# Patient Record
Sex: Female | Born: 1945
Health system: Southern US, Community
[De-identification: ages and names within clinical notes are randomized; demographics above are authoritative.]

## PROBLEM LIST (undated history)

## (undated) DIAGNOSIS — E785 Hyperlipidemia, unspecified: Secondary | ICD-10-CM

## (undated) DIAGNOSIS — F329 Major depressive disorder, single episode, unspecified: Secondary | ICD-10-CM

## (undated) DIAGNOSIS — I639 Cerebral infarction, unspecified: Secondary | ICD-10-CM

## (undated) DIAGNOSIS — I2510 Atherosclerotic heart disease of native coronary artery without angina pectoris: Secondary | ICD-10-CM

## (undated) DIAGNOSIS — I350 Nonrheumatic aortic (valve) stenosis: Secondary | ICD-10-CM

## (undated) DIAGNOSIS — Z8616 Personal history of COVID-19: Secondary | ICD-10-CM

## (undated) DIAGNOSIS — Z531 Procedure and treatment not carried out because of patient's decision for reasons of belief and group pressure: Secondary | ICD-10-CM

## (undated) DIAGNOSIS — I10 Essential (primary) hypertension: Secondary | ICD-10-CM

## (undated) DIAGNOSIS — I739 Peripheral vascular disease, unspecified: Secondary | ICD-10-CM

## (undated) DIAGNOSIS — N182 Chronic kidney disease, stage 2 (mild): Secondary | ICD-10-CM

## (undated) DIAGNOSIS — K5790 Diverticulosis of intestine, part unspecified, without perforation or abscess without bleeding: Secondary | ICD-10-CM

## (undated) DIAGNOSIS — M1990 Unspecified osteoarthritis, unspecified site: Secondary | ICD-10-CM

## (undated) DIAGNOSIS — F32A Depression, unspecified: Secondary | ICD-10-CM

## (undated) DIAGNOSIS — C50919 Malignant neoplasm of unspecified site of unspecified female breast: Secondary | ICD-10-CM

## (undated) DIAGNOSIS — IMO0001 Reserved for inherently not codable concepts without codable children: Secondary | ICD-10-CM

## (undated) DIAGNOSIS — Z950 Presence of cardiac pacemaker: Secondary | ICD-10-CM

## (undated) HISTORY — DX: Essential (primary) hypertension: I10

## (undated) HISTORY — DX: Malignant neoplasm of unspecified site of unspecified female breast: C50.919

## (undated) HISTORY — DX: Reserved for inherently not codable concepts without codable children: IMO0001

## (undated) HISTORY — DX: Atherosclerotic heart disease of native coronary artery without angina pectoris: I25.10

## (undated) HISTORY — DX: Procedure and treatment not carried out because of patient's decision for reasons of belief and group pressure: Z53.1

## (undated) HISTORY — DX: Depression, unspecified: F32.A

## (undated) HISTORY — PX: CHOLECYSTECTOMY: SHX55

## (undated) HISTORY — DX: Cerebral infarction, unspecified: I63.9

## (undated) HISTORY — DX: Peripheral vascular disease, unspecified: I73.9

## (undated) HISTORY — DX: Nonrheumatic aortic (valve) stenosis: I35.0

## (undated) HISTORY — DX: Diverticulosis of intestine, part unspecified, without perforation or abscess without bleeding: K57.90

## (undated) HISTORY — DX: Major depressive disorder, single episode, unspecified: F32.9

## (undated) HISTORY — DX: Hyperlipidemia, unspecified: E78.5

## (undated) SURGERY — Surgical Case
Anesthesia: *Unknown

---
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
## 1985-02-24 HISTORY — PX: ABDOMINAL HYSTERECTOMY: SHX81

## 1990-02-24 DIAGNOSIS — I130 Hypertensive heart and chronic kidney disease with heart failure and stage 1 through stage 4 chronic kidney disease, or unspecified chronic kidney disease: Secondary | ICD-10-CM | POA: Diagnosis present

## 1990-02-24 DIAGNOSIS — E876 Hypokalemia: Secondary | ICD-10-CM | POA: Diagnosis present

## 1990-02-24 DIAGNOSIS — R001 Bradycardia, unspecified: Secondary | ICD-10-CM | POA: Diagnosis present

## 1990-03-25 DIAGNOSIS — E119 Type 2 diabetes mellitus without complications: Secondary | ICD-10-CM | POA: Insufficient documentation

## 1990-03-25 DIAGNOSIS — E1129 Type 2 diabetes mellitus with other diabetic kidney complication: Secondary | ICD-10-CM

## 1993-02-24 HISTORY — PX: MASTECTOMY: SHX3

## 1994-02-24 DIAGNOSIS — C50919 Malignant neoplasm of unspecified site of unspecified female breast: Secondary | ICD-10-CM

## 1994-02-24 HISTORY — DX: Malignant neoplasm of unspecified site of unspecified female breast: C50.919

## 1997-12-13 ENCOUNTER — Encounter: Admission: RE | Admit: 1997-12-13 | Discharge: 1997-12-13 | Payer: Self-pay | Admitting: Family Medicine

## 1998-01-16 ENCOUNTER — Encounter: Admission: RE | Admit: 1998-01-16 | Discharge: 1998-01-16 | Payer: Self-pay | Admitting: Sports Medicine

## 1998-04-25 ENCOUNTER — Encounter (INDEPENDENT_AMBULATORY_CARE_PROVIDER_SITE_OTHER): Payer: Self-pay | Admitting: *Deleted

## 1998-04-25 LAB — CONVERTED CEMR LAB

## 1998-04-27 ENCOUNTER — Encounter: Admission: RE | Admit: 1998-04-27 | Discharge: 1998-04-27 | Payer: Self-pay | Admitting: Family Medicine

## 1998-05-07 ENCOUNTER — Encounter: Admission: RE | Admit: 1998-05-07 | Discharge: 1998-05-07 | Payer: Self-pay | Admitting: Sports Medicine

## 1998-10-15 ENCOUNTER — Emergency Department (HOSPITAL_COMMUNITY): Admission: EM | Admit: 1998-10-15 | Discharge: 1998-10-15 | Payer: Self-pay | Admitting: Emergency Medicine

## 1998-10-16 ENCOUNTER — Encounter: Admission: RE | Admit: 1998-10-16 | Discharge: 1998-10-16 | Payer: Self-pay | Admitting: Family Medicine

## 1998-10-26 ENCOUNTER — Encounter: Admission: RE | Admit: 1998-10-26 | Discharge: 1998-10-26 | Payer: Self-pay | Admitting: Family Medicine

## 1999-02-25 DIAGNOSIS — K5790 Diverticulosis of intestine, part unspecified, without perforation or abscess without bleeding: Secondary | ICD-10-CM

## 1999-02-25 HISTORY — DX: Diverticulosis of intestine, part unspecified, without perforation or abscess without bleeding: K57.90

## 1999-03-18 ENCOUNTER — Encounter: Admission: RE | Admit: 1999-03-18 | Discharge: 1999-03-18 | Payer: Self-pay | Admitting: Sports Medicine

## 1999-03-18 ENCOUNTER — Encounter: Admission: RE | Admit: 1999-03-18 | Discharge: 1999-03-18 | Payer: Self-pay | Admitting: Family Medicine

## 1999-03-18 ENCOUNTER — Encounter: Payer: Self-pay | Admitting: Sports Medicine

## 1999-04-08 ENCOUNTER — Encounter: Admission: RE | Admit: 1999-04-08 | Discharge: 1999-04-08 | Payer: Self-pay | Admitting: Family Medicine

## 1999-04-29 ENCOUNTER — Ambulatory Visit (HOSPITAL_COMMUNITY): Admission: RE | Admit: 1999-04-29 | Discharge: 1999-04-29 | Payer: Self-pay

## 1999-05-10 ENCOUNTER — Encounter: Admission: RE | Admit: 1999-05-10 | Discharge: 1999-05-10 | Payer: Self-pay | Admitting: Family Medicine

## 1999-07-05 ENCOUNTER — Ambulatory Visit (HOSPITAL_COMMUNITY): Admission: RE | Admit: 1999-07-05 | Discharge: 1999-07-05 | Payer: Self-pay | Admitting: *Deleted

## 1999-07-05 LAB — HM COLONOSCOPY

## 1999-12-28 ENCOUNTER — Emergency Department (HOSPITAL_COMMUNITY): Admission: EM | Admit: 1999-12-28 | Discharge: 1999-12-29 | Payer: Self-pay | Admitting: Emergency Medicine

## 1999-12-30 ENCOUNTER — Encounter: Admission: RE | Admit: 1999-12-30 | Discharge: 1999-12-30 | Payer: Self-pay | Admitting: Family Medicine

## 2000-01-10 ENCOUNTER — Encounter: Admission: RE | Admit: 2000-01-10 | Discharge: 2000-01-10 | Payer: Self-pay | Admitting: General Surgery

## 2000-01-10 ENCOUNTER — Encounter: Payer: Self-pay | Admitting: General Surgery

## 2000-03-18 ENCOUNTER — Encounter: Admission: RE | Admit: 2000-03-18 | Discharge: 2000-03-18 | Payer: Self-pay | Admitting: Family Medicine

## 2000-06-08 ENCOUNTER — Ambulatory Visit (HOSPITAL_COMMUNITY): Admission: RE | Admit: 2000-06-08 | Discharge: 2000-06-08 | Payer: Self-pay | Admitting: Family Medicine

## 2000-06-08 ENCOUNTER — Encounter: Admission: RE | Admit: 2000-06-08 | Discharge: 2000-06-08 | Payer: Self-pay | Admitting: Family Medicine

## 2000-06-09 ENCOUNTER — Encounter: Admission: RE | Admit: 2000-06-09 | Discharge: 2000-06-09 | Payer: Self-pay | Admitting: Family Medicine

## 2000-07-10 ENCOUNTER — Ambulatory Visit (HOSPITAL_COMMUNITY): Admission: RE | Admit: 2000-07-10 | Discharge: 2000-07-10 | Payer: Self-pay | Admitting: Sports Medicine

## 2000-07-21 ENCOUNTER — Encounter: Admission: RE | Admit: 2000-07-21 | Discharge: 2000-07-21 | Payer: Self-pay | Admitting: Family Medicine

## 2000-10-13 ENCOUNTER — Encounter: Admission: RE | Admit: 2000-10-13 | Discharge: 2000-10-13 | Payer: Self-pay | Admitting: Family Medicine

## 2001-11-23 ENCOUNTER — Encounter: Admission: RE | Admit: 2001-11-23 | Discharge: 2001-11-23 | Payer: Self-pay | Admitting: Sports Medicine

## 2002-03-19 ENCOUNTER — Emergency Department (HOSPITAL_COMMUNITY): Admission: EM | Admit: 2002-03-19 | Discharge: 2002-03-20 | Payer: Self-pay | Admitting: Emergency Medicine

## 2002-07-29 ENCOUNTER — Encounter: Admission: RE | Admit: 2002-07-29 | Discharge: 2002-07-29 | Payer: Self-pay | Admitting: Family Medicine

## 2002-08-05 ENCOUNTER — Encounter: Admission: RE | Admit: 2002-08-05 | Discharge: 2002-08-05 | Payer: Self-pay | Admitting: Family Medicine

## 2003-05-15 ENCOUNTER — Emergency Department (HOSPITAL_COMMUNITY): Admission: EM | Admit: 2003-05-15 | Discharge: 2003-05-15 | Payer: Self-pay | Admitting: Emergency Medicine

## 2003-05-16 ENCOUNTER — Encounter: Admission: RE | Admit: 2003-05-16 | Discharge: 2003-05-16 | Payer: Self-pay | Admitting: Family Medicine

## 2003-05-18 ENCOUNTER — Ambulatory Visit (HOSPITAL_COMMUNITY): Admission: RE | Admit: 2003-05-18 | Discharge: 2003-05-18 | Payer: Self-pay | Admitting: Family Medicine

## 2003-06-01 ENCOUNTER — Encounter: Admission: RE | Admit: 2003-06-01 | Discharge: 2003-06-01 | Payer: Self-pay | Admitting: Family Medicine

## 2003-06-05 ENCOUNTER — Ambulatory Visit (HOSPITAL_COMMUNITY): Admission: RE | Admit: 2003-06-05 | Discharge: 2003-06-05 | Payer: Self-pay | Admitting: Sports Medicine

## 2003-06-09 ENCOUNTER — Encounter: Admission: RE | Admit: 2003-06-09 | Discharge: 2003-06-09 | Payer: Self-pay | Admitting: Sports Medicine

## 2003-06-12 ENCOUNTER — Emergency Department (HOSPITAL_COMMUNITY): Admission: EM | Admit: 2003-06-12 | Discharge: 2003-06-12 | Payer: Self-pay | Admitting: Family Medicine

## 2004-02-25 DIAGNOSIS — I639 Cerebral infarction, unspecified: Secondary | ICD-10-CM

## 2004-02-25 DIAGNOSIS — I2510 Atherosclerotic heart disease of native coronary artery without angina pectoris: Secondary | ICD-10-CM

## 2004-02-25 HISTORY — DX: Atherosclerotic heart disease of native coronary artery without angina pectoris: I25.10

## 2004-02-25 HISTORY — DX: Cerebral infarction, unspecified: I63.9

## 2004-02-25 HISTORY — PX: CORONARY ARTERY BYPASS GRAFT: SHX141

## 2004-02-25 HISTORY — PX: AORTIC VALVE REPLACEMENT: SHX41

## 2004-05-17 ENCOUNTER — Emergency Department (HOSPITAL_COMMUNITY): Admission: EM | Admit: 2004-05-17 | Discharge: 2004-05-18 | Payer: Self-pay | Admitting: *Deleted

## 2004-05-20 ENCOUNTER — Ambulatory Visit: Payer: Self-pay | Admitting: Internal Medicine

## 2004-05-27 ENCOUNTER — Ambulatory Visit: Payer: Self-pay | Admitting: Internal Medicine

## 2004-06-03 ENCOUNTER — Ambulatory Visit: Payer: Self-pay | Admitting: Internal Medicine

## 2004-06-10 ENCOUNTER — Ambulatory Visit: Payer: Self-pay | Admitting: Internal Medicine

## 2004-06-18 ENCOUNTER — Ambulatory Visit: Payer: Self-pay | Admitting: Internal Medicine

## 2004-06-18 ENCOUNTER — Inpatient Hospital Stay (HOSPITAL_COMMUNITY): Admission: AD | Admit: 2004-06-18 | Discharge: 2004-06-26 | Payer: Self-pay | Admitting: Internal Medicine

## 2004-06-18 ENCOUNTER — Ambulatory Visit: Payer: Self-pay | Admitting: Dentistry

## 2004-06-18 ENCOUNTER — Encounter (INDEPENDENT_AMBULATORY_CARE_PROVIDER_SITE_OTHER): Payer: Self-pay | Admitting: Cardiology

## 2004-06-18 ENCOUNTER — Ambulatory Visit: Payer: Self-pay | Admitting: Cardiology

## 2004-06-24 ENCOUNTER — Encounter (INDEPENDENT_AMBULATORY_CARE_PROVIDER_SITE_OTHER): Payer: Self-pay | Admitting: *Deleted

## 2004-07-05 ENCOUNTER — Encounter (INDEPENDENT_AMBULATORY_CARE_PROVIDER_SITE_OTHER): Payer: Self-pay | Admitting: *Deleted

## 2004-07-05 ENCOUNTER — Inpatient Hospital Stay (HOSPITAL_COMMUNITY): Admission: RE | Admit: 2004-07-05 | Discharge: 2004-07-17 | Payer: Self-pay | Admitting: Surgery

## 2004-07-07 ENCOUNTER — Ambulatory Visit: Payer: Self-pay | Admitting: Pulmonary Disease

## 2004-07-08 ENCOUNTER — Encounter: Payer: Self-pay | Admitting: Cardiology

## 2004-07-17 ENCOUNTER — Ambulatory Visit: Payer: Self-pay | Admitting: Physical Medicine & Rehabilitation

## 2004-07-17 ENCOUNTER — Inpatient Hospital Stay (HOSPITAL_COMMUNITY)
Admission: RE | Admit: 2004-07-17 | Discharge: 2004-08-01 | Payer: Self-pay | Admitting: Physical Medicine & Rehabilitation

## 2004-08-08 ENCOUNTER — Ambulatory Visit: Payer: Self-pay | Admitting: Internal Medicine

## 2004-08-13 ENCOUNTER — Ambulatory Visit: Payer: Self-pay | Admitting: Internal Medicine

## 2004-08-20 ENCOUNTER — Encounter: Admission: RE | Admit: 2004-08-20 | Discharge: 2004-08-20 | Payer: Self-pay | Admitting: Surgery

## 2004-09-02 ENCOUNTER — Encounter
Admission: RE | Admit: 2004-09-02 | Discharge: 2004-12-01 | Payer: Self-pay | Admitting: Physical Medicine & Rehabilitation

## 2004-11-28 ENCOUNTER — Ambulatory Visit: Payer: Self-pay | Admitting: Hospitalist

## 2004-12-05 ENCOUNTER — Ambulatory Visit: Payer: Self-pay | Admitting: Internal Medicine

## 2004-12-13 ENCOUNTER — Ambulatory Visit (HOSPITAL_COMMUNITY): Admission: RE | Admit: 2004-12-13 | Discharge: 2004-12-13 | Payer: Self-pay | Admitting: Sports Medicine

## 2004-12-13 LAB — HM DEXA SCAN

## 2004-12-26 ENCOUNTER — Ambulatory Visit: Payer: Self-pay | Admitting: Internal Medicine

## 2004-12-30 ENCOUNTER — Encounter: Admission: RE | Admit: 2004-12-30 | Discharge: 2004-12-30 | Payer: Self-pay | Admitting: Internal Medicine

## 2005-01-01 ENCOUNTER — Ambulatory Visit: Payer: Self-pay | Admitting: Internal Medicine

## 2005-03-28 ENCOUNTER — Ambulatory Visit: Payer: Self-pay | Admitting: Internal Medicine

## 2005-04-01 ENCOUNTER — Encounter: Admission: RE | Admit: 2005-04-01 | Discharge: 2005-04-01 | Payer: Self-pay | Admitting: Internal Medicine

## 2005-06-06 ENCOUNTER — Ambulatory Visit: Payer: Self-pay | Admitting: Internal Medicine

## 2005-06-06 ENCOUNTER — Ambulatory Visit (HOSPITAL_COMMUNITY): Admission: RE | Admit: 2005-06-06 | Discharge: 2005-06-06 | Payer: Self-pay | Admitting: Internal Medicine

## 2005-07-03 ENCOUNTER — Ambulatory Visit: Payer: Self-pay | Admitting: Internal Medicine

## 2005-07-14 ENCOUNTER — Ambulatory Visit: Payer: Self-pay | Admitting: Internal Medicine

## 2006-01-23 DIAGNOSIS — F32A Depression, unspecified: Secondary | ICD-10-CM | POA: Insufficient documentation

## 2006-01-23 DIAGNOSIS — F329 Major depressive disorder, single episode, unspecified: Secondary | ICD-10-CM

## 2006-01-26 ENCOUNTER — Ambulatory Visit: Payer: Self-pay | Admitting: Internal Medicine

## 2006-02-09 ENCOUNTER — Ambulatory Visit (HOSPITAL_COMMUNITY): Admission: RE | Admit: 2006-02-09 | Discharge: 2006-02-09 | Payer: Self-pay | Admitting: Internal Medicine

## 2006-02-09 ENCOUNTER — Ambulatory Visit: Payer: Self-pay | Admitting: Internal Medicine

## 2006-02-12 ENCOUNTER — Ambulatory Visit: Payer: Self-pay | Admitting: *Deleted

## 2006-02-19 ENCOUNTER — Encounter: Admission: RE | Admit: 2006-02-19 | Discharge: 2006-05-20 | Payer: Self-pay | Admitting: Ophthalmology

## 2006-03-19 ENCOUNTER — Ambulatory Visit: Payer: Self-pay | Admitting: Internal Medicine

## 2006-03-19 LAB — CONVERTED CEMR LAB: Blood Glucose, Fingerstick: 393

## 2006-04-08 ENCOUNTER — Ambulatory Visit: Payer: Self-pay | Admitting: Hospitalist

## 2006-04-08 LAB — CONVERTED CEMR LAB
Glucose, Bld: 338 mg/dL
Hgb A1c MFr Bld: 11.8 %

## 2006-04-13 ENCOUNTER — Telehealth (INDEPENDENT_AMBULATORY_CARE_PROVIDER_SITE_OTHER): Payer: Self-pay | Admitting: *Deleted

## 2006-04-23 ENCOUNTER — Ambulatory Visit: Payer: Self-pay | Admitting: Internal Medicine

## 2006-04-23 ENCOUNTER — Encounter (INDEPENDENT_AMBULATORY_CARE_PROVIDER_SITE_OTHER): Payer: Self-pay | Admitting: Ophthalmology

## 2006-04-24 ENCOUNTER — Encounter (INDEPENDENT_AMBULATORY_CARE_PROVIDER_SITE_OTHER): Payer: Self-pay | Admitting: *Deleted

## 2006-05-04 ENCOUNTER — Telehealth (INDEPENDENT_AMBULATORY_CARE_PROVIDER_SITE_OTHER): Payer: Self-pay | Admitting: *Deleted

## 2006-05-08 ENCOUNTER — Encounter: Admission: RE | Admit: 2006-05-08 | Discharge: 2006-05-08 | Payer: Self-pay | Admitting: Sports Medicine

## 2006-05-20 ENCOUNTER — Telehealth (INDEPENDENT_AMBULATORY_CARE_PROVIDER_SITE_OTHER): Payer: Self-pay | Admitting: Ophthalmology

## 2006-05-21 ENCOUNTER — Encounter (INDEPENDENT_AMBULATORY_CARE_PROVIDER_SITE_OTHER): Payer: Self-pay | Admitting: Ophthalmology

## 2006-05-21 ENCOUNTER — Ambulatory Visit: Payer: Self-pay | Admitting: *Deleted

## 2006-06-01 ENCOUNTER — Telehealth (INDEPENDENT_AMBULATORY_CARE_PROVIDER_SITE_OTHER): Payer: Self-pay | Admitting: *Deleted

## 2006-06-02 ENCOUNTER — Encounter (INDEPENDENT_AMBULATORY_CARE_PROVIDER_SITE_OTHER): Payer: Self-pay | Admitting: Ophthalmology

## 2006-06-08 ENCOUNTER — Telehealth (INDEPENDENT_AMBULATORY_CARE_PROVIDER_SITE_OTHER): Payer: Self-pay | Admitting: *Deleted

## 2006-06-15 ENCOUNTER — Ambulatory Visit: Payer: Self-pay | Admitting: Hospitalist

## 2006-06-15 LAB — CONVERTED CEMR LAB: Blood Glucose, Fingerstick: 282

## 2006-06-25 ENCOUNTER — Encounter (INDEPENDENT_AMBULATORY_CARE_PROVIDER_SITE_OTHER): Payer: Self-pay | Admitting: Ophthalmology

## 2006-07-01 ENCOUNTER — Ambulatory Visit: Payer: Self-pay | Admitting: Internal Medicine

## 2006-07-01 LAB — CONVERTED CEMR LAB
Blood Glucose, Fingerstick: 151
Hgb A1c MFr Bld: 8.2 %

## 2006-08-06 ENCOUNTER — Ambulatory Visit: Payer: Self-pay | Admitting: Internal Medicine

## 2006-08-06 LAB — CONVERTED CEMR LAB
Blood Glucose, Fingerstick: 196
Blood Glucose, Home Monitor: 5 mg/dL

## 2006-08-14 ENCOUNTER — Telehealth (INDEPENDENT_AMBULATORY_CARE_PROVIDER_SITE_OTHER): Payer: Self-pay | Admitting: *Deleted

## 2006-08-20 ENCOUNTER — Ambulatory Visit: Payer: Self-pay | Admitting: Internal Medicine

## 2006-08-20 ENCOUNTER — Encounter (INDEPENDENT_AMBULATORY_CARE_PROVIDER_SITE_OTHER): Payer: Self-pay | Admitting: Internal Medicine

## 2006-08-20 ENCOUNTER — Telehealth (INDEPENDENT_AMBULATORY_CARE_PROVIDER_SITE_OTHER): Payer: Self-pay | Admitting: *Deleted

## 2006-08-20 LAB — CONVERTED CEMR LAB: Blood Glucose, Fingerstick: 115

## 2006-11-17 ENCOUNTER — Ambulatory Visit: Payer: Self-pay | Admitting: Hospitalist

## 2006-11-17 LAB — CONVERTED CEMR LAB
Bilirubin Urine: NEGATIVE
Blood Glucose, Fingerstick: 184
Glucose, Urine, Semiquant: NEGATIVE
Hgb A1c MFr Bld: 7.3 %
Ketones, urine, test strip: NEGATIVE
Nitrite: NEGATIVE
Protein, U semiquant: >=300
Specific Gravity, Urine: 1.025
Urobilinogen, UA: 0.2
pH: 6

## 2006-12-18 ENCOUNTER — Ambulatory Visit: Payer: Self-pay | Admitting: *Deleted

## 2006-12-25 ENCOUNTER — Encounter (INDEPENDENT_AMBULATORY_CARE_PROVIDER_SITE_OTHER): Payer: Self-pay | Admitting: Internal Medicine

## 2006-12-25 ENCOUNTER — Ambulatory Visit: Payer: Self-pay | Admitting: Hospitalist

## 2006-12-25 LAB — CONVERTED CEMR LAB
ALT: 11 units/L (ref 0–35)
AST: 14 units/L (ref 0–37)
Albumin: 4.4 g/dL (ref 3.5–5.2)
Alkaline Phosphatase: 91 units/L (ref 39–117)
BUN: 14 mg/dL (ref 6–23)
Bacteria, UA: NONE SEEN
Bilirubin Urine: NEGATIVE
CO2: 29 meq/L (ref 19–32)
Calcium: 9.2 mg/dL (ref 8.4–10.5)
Chloride: 105 meq/L (ref 96–112)
Cholesterol: 116 mg/dL (ref 0–200)
Creatinine, Ser: 0.86 mg/dL (ref 0.40–1.20)
Glucose, Bld: 69 mg/dL — ABNORMAL LOW (ref 70–99)
HDL: 38 mg/dL — ABNORMAL LOW (ref >39)
Hemoglobin, Urine: NEGATIVE
Ketones, ur: NEGATIVE mg/dL
LDL Cholesterol: 52 mg/dL (ref 0–99)
Leukocytes, UA: NEGATIVE
Nitrite: NEGATIVE
Potassium: 4 meq/L (ref 3.5–5.3)
Protein, ur: 30 mg/dL — AB
RBC / HPF: NONE SEEN (ref <3)
Sodium: 146 meq/L — ABNORMAL HIGH (ref 135–145)
Specific Gravity, Urine: 1.009 (ref 1.005–1.03)
Total Bilirubin: 0.3 mg/dL (ref 0.3–1.2)
Total CHOL/HDL Ratio: 3.1
Total Protein: 7.5 g/dL (ref 6.0–8.3)
Triglycerides: 131 mg/dL (ref <150)
Urine Glucose: NEGATIVE mg/dL
Urobilinogen, UA: 0.2 (ref 0.0–1.0)
VLDL: 26 mg/dL (ref 0–40)
WBC, UA: NONE SEEN cells/hpf (ref <3)
pH: 7 (ref 5.0–8.0)

## 2006-12-28 ENCOUNTER — Ambulatory Visit: Payer: Self-pay | Admitting: Hospitalist

## 2007-01-08 ENCOUNTER — Encounter (INDEPENDENT_AMBULATORY_CARE_PROVIDER_SITE_OTHER): Payer: Self-pay | Admitting: Internal Medicine

## 2007-01-08 ENCOUNTER — Ambulatory Visit: Payer: Self-pay | Admitting: Internal Medicine

## 2007-01-08 LAB — CONVERTED CEMR LAB
BUN: 12 mg/dL (ref 6–23)
CO2: 25 meq/L (ref 19–32)
Calcium: 9.5 mg/dL (ref 8.4–10.5)
Chloride: 107 meq/L (ref 96–112)
Creatinine, Ser: 0.85 mg/dL (ref 0.40–1.20)
Glucose, Bld: 95 mg/dL (ref 70–99)
Potassium: 4.1 meq/L (ref 3.5–5.3)
Sodium: 143 meq/L (ref 135–145)

## 2007-02-11 ENCOUNTER — Telehealth (INDEPENDENT_AMBULATORY_CARE_PROVIDER_SITE_OTHER): Payer: Self-pay | Admitting: Internal Medicine

## 2007-03-02 ENCOUNTER — Telehealth: Payer: Self-pay | Admitting: *Deleted

## 2007-03-02 ENCOUNTER — Telehealth (INDEPENDENT_AMBULATORY_CARE_PROVIDER_SITE_OTHER): Payer: Self-pay | Admitting: Pharmacy Technician

## 2007-03-11 ENCOUNTER — Encounter (INDEPENDENT_AMBULATORY_CARE_PROVIDER_SITE_OTHER): Payer: Self-pay | Admitting: Internal Medicine

## 2007-03-22 ENCOUNTER — Telehealth (INDEPENDENT_AMBULATORY_CARE_PROVIDER_SITE_OTHER): Payer: Self-pay | Admitting: *Deleted

## 2007-08-19 ENCOUNTER — Telehealth (INDEPENDENT_AMBULATORY_CARE_PROVIDER_SITE_OTHER): Payer: Self-pay | Admitting: Internal Medicine

## 2007-09-16 ENCOUNTER — Telehealth (INDEPENDENT_AMBULATORY_CARE_PROVIDER_SITE_OTHER): Payer: Self-pay | Admitting: Internal Medicine

## 2007-09-20 ENCOUNTER — Telehealth (INDEPENDENT_AMBULATORY_CARE_PROVIDER_SITE_OTHER): Payer: Self-pay | Admitting: Internal Medicine

## 2008-01-31 ENCOUNTER — Telehealth (INDEPENDENT_AMBULATORY_CARE_PROVIDER_SITE_OTHER): Payer: Self-pay | Admitting: Internal Medicine

## 2008-05-02 ENCOUNTER — Ambulatory Visit: Payer: Self-pay | Admitting: Surgery

## 2008-05-02 ENCOUNTER — Ambulatory Visit: Payer: Self-pay | Admitting: Internal Medicine

## 2008-05-02 ENCOUNTER — Encounter: Payer: Self-pay | Admitting: Internal Medicine

## 2008-05-02 ENCOUNTER — Encounter (INDEPENDENT_AMBULATORY_CARE_PROVIDER_SITE_OTHER): Payer: Self-pay | Admitting: Internal Medicine

## 2008-05-02 ENCOUNTER — Ambulatory Visit (HOSPITAL_COMMUNITY): Admission: RE | Admit: 2008-05-02 | Discharge: 2008-05-02 | Payer: Self-pay | Admitting: Internal Medicine

## 2008-05-02 LAB — CONVERTED CEMR LAB
ALT: 26 units/L (ref 0–35)
AST: 24 units/L (ref 0–37)
Albumin: 3.5 g/dL (ref 3.5–5.2)
Alkaline Phosphatase: 105 units/L (ref 39–117)
BUN: 11 mg/dL (ref 6–23)
Blood Glucose, Fingerstick: 215
CO2: 24 meq/L (ref 19–32)
Calcium: 9.1 mg/dL (ref 8.4–10.5)
Chloride: 105 meq/L (ref 96–112)
Cholesterol: 101 mg/dL (ref 0–200)
Creatinine, Ser: 0.78 mg/dL (ref 0.40–1.20)
Creatinine, Urine: 42.5 mg/dL
Glucose, Bld: 184 mg/dL — ABNORMAL HIGH (ref 70–99)
HCT: 41.8 % (ref 36.0–46.0)
HDL: 20 mg/dL — ABNORMAL LOW (ref >39)
Hemoglobin: 14.4 g/dL (ref 12.0–15.0)
Hgb A1c MFr Bld: 8.5 %
LDL Cholesterol: 39 mg/dL (ref 0–99)
MCHC: 34.5 g/dL (ref 30.0–36.0)
MCV: 94.9 fL (ref 78.0–100.0)
Microalb Creat Ratio: 1893.4 mg/g — ABNORMAL HIGH (ref 0.0–30.0)
Microalb, Ur: 80.47 mg/dL — ABNORMAL HIGH (ref 0.00–1.89)
Platelets: 271 10*3/uL (ref 150–400)
Potassium: 4.4 meq/L (ref 3.5–5.3)
Pro B Natriuretic peptide (BNP): 152 pg/mL — ABNORMAL HIGH (ref 0.0–100.0)
RBC: 4.41 M/uL (ref 3.87–5.11)
RDW: 13.1 % (ref 11.5–15.5)
Sodium: 136 meq/L (ref 135–145)
TSH: 0.739 microintl units/mL (ref 0.350–4.500)
Total Bilirubin: 0.6 mg/dL (ref 0.3–1.2)
Total CHOL/HDL Ratio: 5.1
Total Protein: 6.8 g/dL (ref 6.0–8.3)
Triglycerides: 212 mg/dL — ABNORMAL HIGH (ref <150)
VLDL: 42 mg/dL — ABNORMAL HIGH (ref 0–40)
WBC: 5.2 10*3/uL (ref 4.0–10.5)

## 2008-05-04 ENCOUNTER — Encounter: Payer: Self-pay | Admitting: Internal Medicine

## 2008-05-04 ENCOUNTER — Ambulatory Visit (HOSPITAL_COMMUNITY): Admission: RE | Admit: 2008-05-04 | Discharge: 2008-05-04 | Payer: Self-pay | Admitting: Internal Medicine

## 2008-05-10 ENCOUNTER — Ambulatory Visit: Payer: Self-pay | Admitting: Internal Medicine

## 2008-05-10 LAB — CONVERTED CEMR LAB
OCCULT 1: NEGATIVE
OCCULT 2: NEGATIVE
OCCULT 3: NEGATIVE

## 2008-05-24 ENCOUNTER — Encounter: Admission: RE | Admit: 2008-05-24 | Discharge: 2008-05-24 | Payer: Self-pay | Admitting: Internal Medicine

## 2008-05-25 ENCOUNTER — Encounter (INDEPENDENT_AMBULATORY_CARE_PROVIDER_SITE_OTHER): Payer: Self-pay | Admitting: Internal Medicine

## 2008-06-01 ENCOUNTER — Ambulatory Visit: Payer: Self-pay | Admitting: Internal Medicine

## 2008-06-01 ENCOUNTER — Encounter (INDEPENDENT_AMBULATORY_CARE_PROVIDER_SITE_OTHER): Payer: Self-pay | Admitting: Internal Medicine

## 2008-06-01 LAB — CONVERTED CEMR LAB: Blood Glucose, Fingerstick: 164

## 2008-06-07 ENCOUNTER — Telehealth (INDEPENDENT_AMBULATORY_CARE_PROVIDER_SITE_OTHER): Payer: Self-pay | Admitting: Internal Medicine

## 2008-06-07 ENCOUNTER — Ambulatory Visit: Payer: Self-pay | Admitting: Internal Medicine

## 2008-06-07 LAB — CONVERTED CEMR LAB: Blood Glucose, Fingerstick: 251

## 2008-06-13 ENCOUNTER — Ambulatory Visit (HOSPITAL_COMMUNITY): Admission: RE | Admit: 2008-06-13 | Discharge: 2008-06-13 | Payer: Self-pay | Admitting: Internal Medicine

## 2008-06-13 ENCOUNTER — Ambulatory Visit: Payer: Self-pay | Admitting: Vascular Surgery

## 2008-06-13 ENCOUNTER — Encounter (INDEPENDENT_AMBULATORY_CARE_PROVIDER_SITE_OTHER): Payer: Self-pay | Admitting: Internal Medicine

## 2008-06-22 ENCOUNTER — Ambulatory Visit: Payer: Self-pay | Admitting: *Deleted

## 2008-07-07 ENCOUNTER — Encounter (INDEPENDENT_AMBULATORY_CARE_PROVIDER_SITE_OTHER): Payer: Self-pay | Admitting: Internal Medicine

## 2008-07-07 ENCOUNTER — Ambulatory Visit: Payer: Self-pay | Admitting: *Deleted

## 2008-07-07 LAB — CONVERTED CEMR LAB

## 2008-08-02 ENCOUNTER — Ambulatory Visit: Payer: Self-pay | Admitting: Internal Medicine

## 2008-08-02 LAB — CONVERTED CEMR LAB
Blood Glucose, Fingerstick: 281
Hgb A1c MFr Bld: 7.5 %

## 2008-09-19 ENCOUNTER — Telehealth (INDEPENDENT_AMBULATORY_CARE_PROVIDER_SITE_OTHER): Payer: Self-pay | Admitting: *Deleted

## 2008-11-13 ENCOUNTER — Telehealth (INDEPENDENT_AMBULATORY_CARE_PROVIDER_SITE_OTHER): Payer: Self-pay | Admitting: Internal Medicine

## 2008-12-27 ENCOUNTER — Telehealth (INDEPENDENT_AMBULATORY_CARE_PROVIDER_SITE_OTHER): Payer: Self-pay | Admitting: Internal Medicine

## 2009-03-23 ENCOUNTER — Telehealth (INDEPENDENT_AMBULATORY_CARE_PROVIDER_SITE_OTHER): Payer: Self-pay | Admitting: Internal Medicine

## 2009-08-03 ENCOUNTER — Telehealth (INDEPENDENT_AMBULATORY_CARE_PROVIDER_SITE_OTHER): Payer: Self-pay | Admitting: Internal Medicine

## 2009-09-27 ENCOUNTER — Telehealth: Payer: Self-pay | Admitting: Internal Medicine

## 2009-09-28 ENCOUNTER — Telehealth: Payer: Self-pay | Admitting: Internal Medicine

## 2009-10-01 ENCOUNTER — Telehealth: Payer: Self-pay | Admitting: Internal Medicine

## 2009-11-07 ENCOUNTER — Ambulatory Visit: Payer: Self-pay | Admitting: Internal Medicine

## 2009-11-07 LAB — CONVERTED CEMR LAB
Blood Glucose, Fingerstick: 129
Cholesterol, target level: 200 mg/dL
HDL goal, serum: 40 mg/dL
Hgb A1c MFr Bld: 8.2 %
LDL Goal: 70 mg/dL

## 2009-11-13 ENCOUNTER — Ambulatory Visit: Payer: Self-pay | Admitting: Internal Medicine

## 2009-11-13 LAB — CONVERTED CEMR LAB
ALT: 8 units/L (ref 0–35)
AST: 12 units/L (ref 0–37)
Albumin: 3.9 g/dL (ref 3.5–5.2)
Alkaline Phosphatase: 87 units/L (ref 39–117)
BUN: 18 mg/dL (ref 6–23)
CO2: 29 meq/L (ref 19–32)
Calcium: 9.3 mg/dL (ref 8.4–10.5)
Chloride: 102 meq/L (ref 96–112)
Cholesterol: 130 mg/dL (ref 0–200)
Creatinine, Ser: 0.97 mg/dL (ref 0.40–1.20)
Glucose, Bld: 162 mg/dL — ABNORMAL HIGH (ref 70–99)
HCT: 40.6 % (ref 36.0–46.0)
HDL: 33 mg/dL — ABNORMAL LOW (ref >39)
Hemoglobin: 13.7 g/dL (ref 12.0–15.0)
LDL Cholesterol: 57 mg/dL (ref 0–99)
MCHC: 33.7 g/dL (ref 30.0–36.0)
MCV: 92.3 fL (ref >=78.0)
Platelets: 291 10*3/uL (ref 150–400)
Potassium: 4.3 meq/L (ref 3.5–5.3)
RBC: 4.4 M/uL (ref 3.87–5.11)
RDW: 13.3 % (ref 11.5–15.5)
Sodium: 140 meq/L (ref 135–145)
Total Bilirubin: 0.7 mg/dL (ref 0.3–1.2)
Total CHOL/HDL Ratio: 3.9
Total Protein: 7.1 g/dL (ref 6.0–8.3)
Triglycerides: 200 mg/dL — ABNORMAL HIGH (ref <150)
VLDL: 40 mg/dL (ref 0–40)
WBC: 7.4 10*3/uL (ref 4.0–10.5)

## 2009-11-19 ENCOUNTER — Encounter: Payer: Self-pay | Admitting: Internal Medicine

## 2009-12-18 ENCOUNTER — Ambulatory Visit: Payer: Self-pay | Admitting: Internal Medicine

## 2009-12-18 LAB — CONVERTED CEMR LAB: Blood Glucose, Fingerstick: 136

## 2009-12-31 ENCOUNTER — Telehealth: Payer: Self-pay | Admitting: Internal Medicine

## 2010-01-01 ENCOUNTER — Telehealth (INDEPENDENT_AMBULATORY_CARE_PROVIDER_SITE_OTHER): Payer: Self-pay | Admitting: *Deleted

## 2010-01-07 ENCOUNTER — Telehealth: Payer: Self-pay | Admitting: Internal Medicine

## 2010-03-04 ENCOUNTER — Encounter: Payer: Self-pay | Admitting: Internal Medicine

## 2010-03-05 ENCOUNTER — Encounter: Payer: Self-pay | Admitting: Internal Medicine

## 2010-03-05 ENCOUNTER — Ambulatory Visit
Admission: RE | Admit: 2010-03-05 | Discharge: 2010-03-05 | Payer: Self-pay | Source: Home / Self Care | Attending: Internal Medicine | Admitting: Internal Medicine

## 2010-03-05 ENCOUNTER — Encounter: Payer: Self-pay | Admitting: Licensed Clinical Social Worker

## 2010-03-05 ENCOUNTER — Encounter: Payer: Self-pay | Admitting: Gastroenterology

## 2010-03-05 LAB — CONVERTED CEMR LAB
Bilirubin Urine: NEGATIVE
Blood Glucose, AC Bkfst: 199 mg/dL
Glucose, Urine, Semiquant: NEGATIVE
Hgb A1c MFr Bld: 8.1 %
Ketones, urine, test strip: NEGATIVE
Nitrite: NEGATIVE
Protein, U semiquant: >=300
Specific Gravity, Urine: 1.025
Urobilinogen, UA: 0.2
pH: 6

## 2010-03-11 LAB — GLUCOSE, CAPILLARY: Glucose-Capillary: 199 mg/dL — ABNORMAL HIGH (ref 70–99)

## 2010-03-12 ENCOUNTER — Ambulatory Visit (HOSPITAL_COMMUNITY)
Admission: RE | Admit: 2010-03-12 | Discharge: 2010-03-12 | Payer: Self-pay | Source: Home / Self Care | Attending: Internal Medicine | Admitting: Internal Medicine

## 2010-03-12 LAB — HM MAMMOGRAPHY: HM Mammogram: NEGATIVE

## 2010-03-17 ENCOUNTER — Encounter: Payer: Self-pay | Admitting: Sports Medicine

## 2010-03-26 NOTE — Letter (Signed)
Summary: Medication Adjustment  Medication Adjustment   Imported By: Ollen Bowl 06/18/2006 10:27:14  _____________________________________________________________________  External Attachment:    Type:   Image     Comment:   External Document

## 2010-03-26 NOTE — Progress Notes (Signed)
Summary: Refill/gh  Phone Note Refill Request Message from:  Fax from Pharmacy on August 03, 2009 5:02 PM  Refills Requested: Medication #1:  BD U/F III SHORT PEN NEEDLE 31G X 8 MM MISC to take insulin at meals 3x/day and at bedtime   Last Refilled: 04/29/2009  Method Requested: Electronic Initial call taken by: Sander Nephew RN,  August 03, 2009 5:02 PM    Prescriptions: BD U/F III SHORT PEN NEEDLE 31G X 8 MM MISC (INSULIN PEN NEEDLE) to take insulin at meals 3x/day and at bedtime  #200 x 11   Entered and Authorized by:   Myrtis Ser MD   Signed by:   Myrtis Ser MD on 08/06/2009   Method used:   [facility]        [facility] [address] [phone]* (retail)       [address]       [place], [place]  [address]       Ph: [phone] or [phone]       Fax: [phone]   RxID:   [id number]

## 2010-03-26 NOTE — Letter (Signed)
Summary: BLOOD GLUCOSE  BLOOD GLUCOSE   Imported By: Garlan Fillers 12/26/2009 15:13:19  _____________________________________________________________________  External Attachment:    Type:   Image     Comment:   External Document

## 2010-03-26 NOTE — Assessment & Plan Note (Signed)
Summary: est-ck/fu/meds/cfb   Vital Signs:  Patient Profile:   65 Years Old Female Weight:      173.7 pounds (78.95 kg) Temp:     97.7 degrees F (36.50 degrees C) oral Pulse rate:   90 / minute BP sitting:   146 / 85  (right arm)  Pt. in pain?   no  Vitals Entered By: Nadine Counts Deborra Medina) (Jul 01, 2006 1:32 PM)              Is Patient Diabetic? Yes  CBG Result 151  Have you ever been in a relationship where you felt threatened, hurt or afraid?No   Does patient need assistance? Functional Status Self care Ambulation Impaired:Risk for fall Comments Uses walker   Chief Complaint:  routine ck chronic issues.  History of Present Illness: [name] is a 65 y.o. AAW followed in [facility] for DM, CAD (s/p CABG), s/p aortic valve replacement and a hx of breast cancer, among other issues, who presents for scheduled follow up today mostly in regards to her diabetes.  [name] has met several times with [name] since my last visit with her in February.  She feels thtat she has adapted to her new regimen (including recently initiated Lantus) without difficulty.  In general she has felt pretty well lately with no specific complaints.  She tells me that she went to the [facility] office in regards to her carotid stenosis, and was evaluated with what sounds like carotid dopplers but never heard back in regards to the results of the study.  Current Allergies (reviewed today): No known allergies     Risk Factors: Tobacco use:  never  Mammogram History:    Date of Last Mammogram:  04/29/2003  PAP Smear History:    Date of Last PAP Smear:  04/25/1998   Review of Systems  General      Denies fatigue and weakness.  CV      Denies chest pain or discomfort, shortness of breath with exertion, and swelling of feet.  Resp      Denies shortness of breath.  Neuro      Denies weakness.  Endo      Denies excessive thirst and excessive urination.   Physical Exam  General:  alert, well-developed, and well-nourished.   Head:     normocephalic and atraumatic.   Eyes:     vision grossly intact.   Lungs:     normal respiratory effort, no crackles, and no wheezes.   Heart:     normal rate, regular rhythm, and no JVD.   Abdomen:     soft, non-tender, and normal bowel sounds.   Neurologic:     alert & oriented X3 and cranial nerves II-XII intact.   Skin:     color normal and no rashes.   Cervical Nodes:     no anterior cervical adenopathy.   Psych:     Oriented X3, memory intact for recent and remote, normally interactive, and not anxious appearing.      Impression & Recommendations:  Problem # 1:  DIABETES MELLITUS, TYPE II (ICD-250.00) Audrey Savarino has done an amazing job gaining better control of her diabetes (along with Ferrel Logan).  Her HbA1c when we saw her last in February was 11.8.  Today it's 8.2.  I've congratulated her again today for making such great strides.  She's brought her CBG log.  Her am fasting levels are almost all 90 - 120.  Some of her early  evening/dinner CBG's are somewhat elevated.  Audrey Droke's goal A1C is  ~7.  I've discussed her case with [name], who is very familiar with her, and we've made the plan to have the patient see [name] in the first week of June with the plan of starting mealtime dose of insulin.  It's questionable how much benefit [name] is getting from her oral hypoglycemics.  Regardless, [name] will start prandial glucose treatment in June and go through the training for this with Audrey Kotter.  She continues to have regular/annual ophthalmic follow up for her hx of retinopathy.  Her updated medication list for this problem includes:    Metformin Hcl 1000 Mg Tabs (Metformin hcl) .Marland Kitchen... Take 1 tablet by mouth two times a day    Glipizide 10 Mg Tabs (Glipizide) .Marland Kitchen... Take 1 tablet by mouth two times a day    Lisinopril 20 Mg Tabs (Lisinopril) .Marland Kitchen... Take 2 tablets by mouth every morning and 1 tablet every evening     Aspir-low 81 Mg Tbec (Aspirin) .Marland Kitchen... Take 1 tablet by mouth once a day    Lantus Solostar 100 Unit/ml Soln (Insulin glargine) .Marland KitchenMarland KitchenMarland KitchenMarland Kitchen 50 units injected every night.  Orders: T-Hgb A1C (in-house) [id number]) T- Capillary Blood Glucose (13244)  Labs Reviewed: HgBA1c: 8.2 (07/01/2006)      Problem # 2:  ADENOCARCINOMA, BREAST, LEFT (ICD-174.9) Normal right sided mammogram in March 2008.  Continue annual screening of the right breast.  Problem # 3:  CAROTID ARTERY STENOSIS, RIGHT (ICD-433.10) I'm going to ask [facility] to forward their office note from her visit and to learn results of carotid doppler study (if done).  Notably her prior CVA is from an embolic source as a complication from her CABG + aortic valve replacement surgery.  From my last note in February 2008:: In completeing her preload, I noted that she was supposed to follow up with vascular surgery regarding her right sided carotid stenosis. Her updated medication list for this problem includes:    Aspir-low 81 Mg Tbec (Aspirin) .Marland Kitchen... Take 1 tablet by mouth once a day   Problem # 4:  CORONARY ARTERY DISEASE (ICD-414.00) Again, similar to my last note.  She's on an ACE, Beta blocker, aspirin; however, we still don't have her on a statin and I'm not finding any fasting lipid panels.  She forgot to come back for a FLP after our last visit.  I've asked her to return fasting sometime in the next two weeks.  If her LDL is not at goal I will not hesitate to start a statin in this patient with CAD, carotid stenosis and, prior stroke. Her updated medication list for this problem includes:    Hydrochlorothiazide 25 Mg Tabs (Hydrochlorothiazide) .Marland Kitchen... Take 1 tablet by mouth once a day    Lisinopril 20 Mg Tabs (Lisinopril) .Marland Kitchen... Take 2 tablets by mouth every morning and 1 tablet every evening    Norvasc 10 Mg Tabs (Amlodipine besylate) .Marland Kitchen... Take 1 tablet by mouth once a day    Metoprolol Succinate 25 Mg Tb24 (Metoprolol succinate) .Marland Kitchen... Take 1 tablet  by mouth two times a day    Aspir-low 81 Mg Tbec (Aspirin) .Marland Kitchen... Take 1 tablet by mouth once a day   Problem # 5:  HYPERTENSION (ICD-401.9) I've noted that her BP is somewhat up from her last visit.   We'll recheck it again at her next visit.  We can certainly add to her regimen if needed at that time.   Her updated medication  list for this problem includes:    Hydrochlorothiazide 25 Mg Tabs (Hydrochlorothiazide) .Marland Kitchen... Take 1 tablet by mouth once a day    Lisinopril 20 Mg Tabs (Lisinopril) .Marland Kitchen... Take 2 tablets by mouth every morning and 1 tablet every evening    Norvasc 10 Mg Tabs (Amlodipine besylate) .Marland Kitchen... Take 1 tablet by mouth once a day    Metoprolol Succinate 25 Mg Tb24 (Metoprolol succinate) .Marland Kitchen... Take 1 tablet by mouth two times a day  Orders: T-Basic Metabolic Panel ([id number])  BP today: 146/85 Prior BP: 139/82 (04/08/2006)   Problem # 6:  DEPRESSION (ICD-311) Clinically seems improved.  Continue to follow on paxil.  Her updated medication list for this problem includes:    Paxil 10 Mg Tabs (Paroxetine hcl) .Marland Kitchen... Take 1 tablet by mouth once a day   Problem # 7:  Preventive Health Care (ICD-V70.0) Mammogram normal in March 2008 as mentioned.   Patient reports that she's had a hysterectomy.     Problem # 8:  CEREBROVASCULAR ACCIDENT, HX OF (ICD-V12.50) Her stroke was a complication of her CABG and AV replacement surgery.  I had referred her in February to the [facility] rehab services as an outpaitne, as she had reported that she still had significant difficulties ambulating.  She's completed her rehab therapy, and overall thinks that she's gained some lower extremity strength and some improvement with ambulation.  She still requires either the use of a cane or walker on a daily basis, but reports no difficulty performing her ADLs independently.    Other Orders: Future Orders: T-Lipid Profile ([id number]) ... 07/02/2006   Patient Instructions: 1)  Please schedule a  follow-up appointment in 3 - 4 months with PCP. 2)  Don't forget your appointment with [name] in the first week of June. 3)  Continue taking insulin and practicing the things that you learned from [name] - you are doing a great job.  4)  Please return at some point in the next 2 weeks FASTING in the morning for lab/blood work.  We will check your cholesterol. 5)  We will try to find out what the vascular surgeons found when they looked at your neck blood vessels.  6)  Bring your medications in a bag to your next appointment with the doctor.  Laboratory Results   Blood Tests   Date/Time Recieved: Jul 01, 2006 1:55 PM  Date/Time Reported: ..................................................................Marland KitchenMaryan Rued  Jul 01, 2006 1:55 PM   HGBA1C: 8.2%   (Normal Range: Non-Diabetic - 3-6%   Control Diabetic - 6-8%) CBG Random: 151              Diabetic Foot Exam Last Podiatry Exam Date: 07/01/2006  Foot Inspection Is there a history of a foot ulcer?              No Is there a foot ulcer now?              No Can the patient see the bottom of their feet?          Yes Are the shoes appropriate in style and fit?          Yes Is there pain in the calf muscle (Intermittent claudication) when walking?    No Pulse Check          Right Foot          Left Foot Dorsalis Pedis:        2+  2+ Comments: somewhat cool lower extremities.  Low threshold in the future to refer for ABI's given her known hx of arterial disease.   10-g (5.07) Semmes-Weinstein Monofilament Test Performed by: Maxine Glenn          Right Foot          Left Foot Site 1         normal         normal Site 4         normal         normal Site 5         normal         normal Site 6         normal         abnormal  Appended Document: est-ck/fu/meds/cfb    Clinical Lists Changes  Problems: Assessed CAROTID ARTERY STENOSIS, RIGHT as comment only - Received note from [facility].  Carotid duplex  exam in 04/2006 showed no L ICA stenosis.  R side has 20-39% ICA stenosis.  Mild plaque was noted on the right.   'unable to detect elevated velocities in distal ICA which was found on previous studies'.  Signed by Dr. Amedeo Plenty.  Her updated medication list for this problem includes:    Aspir-low 81 Mg Tbec (Aspirin) .Marland Kitchen... Take 1 tablet by mouth once a day       Problem # 13:  CAROTID ARTERY STENOSIS, RIGHT (ICD-433.10) Received note from [facility].  Carotid duplex exam in 04/2006 showed no L ICA stenosis.  R side has 20-39% ICA stenosis.  Mild plaque was noted on the right.   "unable to detect elevated velocities in distal ICA which was found on previous studies".  Signed by Dr. Amedeo Plenty.  Her updated medication list for this problem includes:    Aspir-low 81 Mg Tbec (Aspirin) .Marland Kitchen... Take 1 tablet by mouth once a day

## 2010-03-26 NOTE — Miscellaneous (Signed)
Summary: HIPAA Restrictions  HIPAA Restrictions   Imported By: Bonner Puna 05/02/2008 16:30:45  _____________________________________________________________________  External Attachment:    Type:   Image     Comment:   External Document

## 2010-03-26 NOTE — Progress Notes (Signed)
Summary: refill/dmr  Phone Note Outgoing Call   Refills Requested: Medication #1:  FREESTYLE LITE  STRP to test 3x/day before meals   Brand Name Necessary? Yes Call placed by: [name],  June 01, 2006 11:04 AM Summary of Call: needs test srtips called in to test more often  Follow-up for Phone Call        That's fine.   Follow-up by: Katy Apo MD,  June 03, 2006 10:33 AM  Additional Follow-up for Phone Call Additional follow up Details #1::        Phone call completed Additional Follow-up by: [name],  June 03, 2006 12:24 PM  New/Updated Medications: FREESTYLE LITE  STRP (GLUCOSE BLOOD) to test 3x/day before meals BD ULTRA-FINE 33 LANCETS  MISC (LANCETS) to test 3x/day before meals  New/Updated Medications: FREESTYLE LITE  STRP (GLUCOSE BLOOD) to test 3x/day before meals BD ULTRA-FINE 33 LANCETS  MISC (LANCETS) to test 3x/day before meals  Prescriptions: FREESTYLE LITE  STRP (GLUCOSE BLOOD) to test 3x/day before meals  #1 box x 8   Entered by:   [name]   Authorized by:   Katy Apo MD   Signed by:   Katy Apo MD on 06/03/2006   Method used:   Telephoned to ...       [facility] [address]       [place], [place]  [address]  [place]       Ph: [phone]       Fax: [phone]   RxID:   [id number]

## 2010-03-26 NOTE — Progress Notes (Signed)
Summary: Refill/gh  Phone Note Refill Request Message from:  Fax from Pharmacy on December 31, 2009 4:46 PM  Refills Requested: Medication #1:  NOVOLOG FLEXPEN 100 UNIT/ML  SOLN inject before meals 8 units before breakfast & lunch and 7 units before dinner. ADD 1 unit for each 50 mg/dl > 150 to base amount of insulin.   Last Refilled: 11/29/2009  Method Requested: Electronic Initial call taken by: Sander Nephew RN,  December 31, 2009 4:46 PM  Follow-up for Phone Call        Dr Newt Lukes saw pt in 9/11 and requested 1 week F/U - no appt made. I sent a flag to Ms Cyndi Bender to schedule an appt.  Follow-up by: Larey Dresser MD,  December 31, 2009 5:40 PM    Prescriptions: NOVOLOG FLEXPEN 100 UNIT/ML  SOLN (INSULIN ASPART) inject before meals 8 units before breakfast & lunch and 7 units before dinner. ADD 1 unit for each 50 mg/dl > 150 to base amount of insulin.  #2 pens x 2   Entered and Authorized by:   Larey Dresser MD   Signed by:   Larey Dresser MD on 12/31/2009   Method used:   Telephoned to ...       [facility] [address] [phone]* (retail)       [address]       [place], [place]  [address]       Ph: [phone] or [phone]       Fax: [phone]   RxID:   [id number]

## 2010-03-26 NOTE — Progress Notes (Signed)
Summary: refill/ hla  Phone Note Refill Request Message from:  Fax from Pharmacy on September 16, 2007 2:18 PM  Refills Requested: Medication #1:  PAXIL 20 MG  TABS Take one tablet once daily.   Last Refilled: 6/25 Initial call taken by: Freddy Finner RN,  September 16, 2007 2:19 PM  Follow-up for Phone Call        Please make her an appointment for an A1C, CMET and check up.       Prescriptions: NEURONTIN 300 MG CAPS (GABAPENTIN) Take 1 capsule by mouth three times a day  #90 x 6   Entered and Authorized by:   Myrtis Ser MD   Signed by:   Myrtis Ser MD on 09/20/2007   Method used:   Electronically sent to ...       [facility]  [facility] [address] [phone]*       [address]       [place], [place]  [address]       Ph: [phone] or [phone]       Fax: [phone]   RxID:   [id number] HYDROCHLOROTHIAZIDE 25 MG TABS (HYDROCHLOROTHIAZIDE) Take 1 tablet by mouth once a day  #32 x 6   Entered and Authorized by:   Myrtis Ser MD   Signed by:   Myrtis Ser MD on 09/20/2007   Method used:   Electronically sent to ...       [facility]  [facility] [address] [phone]*       [address]       [place], [place]  [address]       Ph: [phone] or [phone]       Fax: [phone]   RxID:   [id number] METFORMIN HCL 1000 MG TABS (METFORMIN HCL) Take 1 tablet by mouth two times a day  #64 x 6   Entered and Authorized by:   Myrtis Ser MD   Signed by:   Myrtis Ser MD on 09/20/2007   Method used:   Electronically sent to ...       [facility]  [facility] [address] [phone]*       [place]       [place], [place]  [address]       Ph: [phone] or [phone]       Fax: [phone]   RxID:   [id number] LISINOPRIL 40 MG  TABS (LISINOPRIL) Take one tablet two times a day.  #64 x 6   Entered and Authorized by:   Myrtis Ser MD   Signed by:   Myrtis Ser MD on 09/20/2007   Method used:    Electronically sent to ...       [facility]  [facility] [address] [phone]*       [address]       [place], [place]  [address]       Ph: [phone] or [phone]       Fax: [phone]   RxID:   [id number] FOSAMAX 70 MG TABS (ALENDRONATE SODIUM) Take 1 tablet by mouth once each Sunday  #4 x 6   Entered and Authorized by:   Myrtis Ser MD   Signed by:   Myrtis Ser MD on 09/20/2007   Method used:   Electronically sent to ...       [facility]  [facility] [address] [phone]*  [address]       [place], [place]  [address]       Ph: [phone] or [phone]       Fax: [phone]   RxID:   [id number] NORVASC 10 MG TABS (AMLODIPINE BESYLATE) Take 1 tablet by mouth once a day  #32 x 6   Entered and Authorized by:   Elby Showers MD   Signed by:   Elby Showers MD on 09/20/2007   Method used:   Electronically sent to ...       [facility]  [facility] [address] [phone]*       [address]       [place], [place]  [address]       Ph: [phone] or [phone]       Fax: [phone]   RxID:   [id number] METOPROLOL SUCCINATE 25 MG TB24 (METOPROLOL SUCCINATE) Take 1 tablet by mouth two times a day  #64 x 6   Entered and Authorized by:   Elby Showers MD   Signed by:   Elby Showers MD on 09/20/2007   Method used:   Electronically sent to ...       [facility]  [facility] [address] [phone]*       [address]       [place], [place]  [address]       Ph: [phone] or [phone]       Fax: [phone]   RxID:   [id number] ZOCOR 40 MG TABS (SIMVASTATIN) Take 1 tablet by mouth once a day  #32 x 6   Entered and Authorized by:   Elby Showers MD   Signed by:   Elby Showers MD on 09/20/2007   Method used:   Electronically sent to ...       [facility]S  [facility] [address] [phone]*       [address]       [place], [place]  [address]       Ph:  [phone] or [phone]       Fax: [phone]   RxID:   [id number] PAXIL 20 MG  TABS (PAROXETINE HCL) Take one tablet once daily.  #32 x 6   Entered and Authorized by:   Elby Showers MD   Signed by:   Elby Showers MD on 09/20/2007   Method used:   Electronically sent to ...       [facility]S  [facility] [address] [phone]*       [address]       [place], [place]  [address]       Ph: [phone] or [phone]       Fax: [phone]   RxID:   [id number] OS-CAL 500 + D  TABS (CALCIUM CARBONATE-VITAMIN D TABS) Take 1 tablet by mouth three times a day  #96 x 6   Entered and Authorized by:   Elby Showers MD   Signed by:   Elby Showers MD on 09/20/2007   Method used:   Electronically sent to ...       [facility]S  [facility] [address] [phone]*       [address]       [place], [place]  [address]       Ph: [phone] or  [phone]       Fax: [phone]   RxID:   [id number] ASPIR-LOW 81 MG TBEC (ASPIRIN) Take 1 tablet by mouth once a day  #32 x 6   Entered and Authorized by:   Myrtis Ser MD   Signed by:   Myrtis Ser MD on 09/20/2007   Method used:   Electronically sent to ...       [facility]S  [facility] [address] [phone]*       [place]       [place], [place]  [address]       Ph: [phone] or [phone]       Fax: [phone]   RxID:   [id number]

## 2010-03-26 NOTE — Letter (Signed)
Summary: Meter Download  Meter Download   Imported By: Bonner Puna 08/12/2006 10:01:18  _____________________________________________________________________  External Attachment:    Type:   Image     Comment:   External Document
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
## 2010-03-26 NOTE — Assessment & Plan Note (Signed)
Summary: NEED MEDICATION/SB.   Vital Signs:  Patient profile:   65 year old female Height:      67 inches (170.18 cm) Weight:      179.8 pounds (81.73 kg) BMI:     28.26 Temp:     97.9 degrees F (36.61 degrees C) oral Pulse rate:   67 / minute BP sitting:   136 / 76  (right arm)  Vitals Entered By: Mateo Flow Deborra Medina) (November 07, 2009 2:43 PM) CC: med refill, recurrent leg pain, Lipid Management Is Patient Diabetic? Yes Did you bring your meter with you today? No Pain Assessment Patient in pain? no      Nutritional Status BMI of 25 - 29 = overweight CBG Result 129  Have you ever been in a relationship where you felt threatened, hurt or afraid?No   Does patient need assistance? Functional Status Self care Ambulation Impaired:Risk for fall Comments walker   Diabetic Foot Exam Last Podiatry Exam Date: 07/01/2006 Foot Inspection Is there a foot ulcer now?              Yes Can the patient see the bottom of their feet?          Yes Is there swelling or an abnormal foot shape?          Yes Are the toenails long?                Yes Are the toenails thick?                Yes  Diabetic Foot Care Education Patient educated on appropriate care of diabetic feet.  Pulse Check          Right Foot          Left Foot Dorsalis Pedis:        normal            normal  High Risk Feet? Yes   10-g (5.07) Semmes-Weinstein Monofilament Test Performed by: Adline Peals          Right Foot          Left Foot Site 1         normal         normal Site 2         normal         normal Site 3         normal         normal Site 4         normal         normal Site 5         normal         normal Site 6         normal         normal  Impression      normal         normal   CC:  med refill, recurrent leg pain, and Lipid Management.  History of Present Illness: This is a 65 year old female with PMH with DM, HTN,  Hyperlipidemia who is here for a follow up visit for her chronic  medical problems. Patient states that she is doing fine but just needs refills for all her medications. Patient noted that she was taking all her medication but looking back her refill data it is not clear.   1. DM: Metformin 1000 mg two times a day (refill date 12/27/2008 with 6 refills), Lantus 25  units at bed time (refill 03/23/2009 with 3 refills) and Novolog flexpen 7-8 units each meal 3 times daily (refill 10/01/09 with 1 refill). Hgb A1c was 8. 2 today which deteriorated compared to the  last Hgb A1c which was 7.5 (07/2008) , CBG 129 nonfasting. Patient noted that she was checking her blood sugars regularly and they were running between 150 and 210 with occasionally lows in the 80s.   2. HTN:  HTCZ 25 mg last filled in 2010 but she stated she still have some left, Metoprolol 25 mg (refill 10/2008 with 6 refills) and Lisinopril 40 mg (09/28/2009) BP today was 136/76. Patient noted that is taking all her medication.  Recommend to continue on current medication and emphazided the importance of taking the medications. Follow on BP in one week  3. HLD:  Zocor 40 mg (refill 02/2009 with 6 refills), last lipid panel 04/2008 Chol 101, LDL 39, HDL 20, Trigl 212  4. Neuropathy: Patient noted that she was not taking Neurontin . She noted some pain in both lower extremities and feeling of numbness  5. Depression: Patient currently on Paroxetine 20 mg (refill 02/2009 with 6 refills)   6. Patient was not taking oscal and fosamax   Depression History:      The patient denies a depressed mood most of the day and a diminished interest in her usual daily activities.         Lipid Management History:      Positive NCEP/ATP III risk factors include female age 106 years old or older, diabetes, HDL cholesterol less than 40, hypertension, and ASHD (either angina/prior MI/prior CABG).  Negative NCEP/ATP III risk factors include non-tobacco-user status.               Preventive Screening-Counseling &  Management  Alcohol-Tobacco     Alcohol drinks/day: <1     Alcohol type: OCCAS. BEER     Smoking Status: never     Passive Smoke Exposure: no  Allergies: No Known Drug Allergies  Diabetes Management Exam:    Foot Exam (with socks and/or shoes not present):       Sensory-Monofilament:          Left foot: normal          Right foot: normal   Impression & Recommendations:  Problem # 1:  DIABETES MELLITUS, TYPE II (ICD-250.00) Currently on Metformin 1000 mg two times a day, Lantus 25 units at bed time and Novolog flexpen 7-8 units each meal 3 times daily. Hgb A1c was 8. 2 today which deteriorated compared to the  last Hgb A1c which was 7.5 (07/2008) , CBG 129 nonfasting. Diabetic foot exam was within normal limits concerning monofilament evaluation but pt had an visible lesion on her foot. Patient was advised to have good foot care and consider  possible podiatrist referral at the next visit. Patient is referred to an Ophthalmologist since she never had a documented eye exam. Patient was referred to [name] for Diabetic education. Patient was advised to take all her medication as prescribed, check blood sugars 3 times a day and one time at bedtime and follow up in a week and bring in her meter.   Her updated medication list for this problem includes:    Metformin Hcl 1000 Mg Tabs (Metformin hcl) .Marland Kitchen... Take 1 tablet by mouth two times a day    Lisinopril 40 Mg Tabs (Lisinopril) .Marland Kitchen... Take one tablet daily. you need to call the clinic at [phone] for an  appoitment asap to receive additional medications.    Aspir-low 81 Mg Tbec (Aspirin) .Marland Kitchen... Take 1 tablet by mouth once a day    Lantus Solostar 100 Unit/ml Soln (Insulin glargine) .Marland Kitchen... 24 units injected every night    Novolog Flexpen 100 Unit/ml Soln (Insulin aspart) ..... Inject before meals as instructed 7 or 8 units each meal 3 times daily. please call the clinic [phone] to make an appoitment  Orders: T-Hgb A1C (in-house)  [phone]) T- Capillary Blood Glucose (62130) Diabetic Clinic Referral (Diabetic) Ophthalmology Referral (Ophthalmology) T-Urine Microalbumin w/creat. ratio [phone])  Problem # 2:  HYPERTENSION (ICD-401.9) HTCZ 25 mg last filled in 2010 but she stated she still have some left, Metoprolol 25 mg (refill 10/2008 with 6 refills) and Lisinopril 40 mg (09/28/2009) BP today was 136/76. Patient noted that is taking all her medication.  Recommend to continue on current medication and emphazided the importance of taking the medications. Follow on BP in one week  Her updated medication list for this problem includes:    Hydrochlorothiazide 25 Mg Tabs (Hydrochlorothiazide) .Marland Kitchen... Take 1 tablet by mouth once a day    Lisinopril 40 Mg Tabs (Lisinopril) .Marland Kitchen... Take one tablet daily. you need to call the clinic at [phone] for an appoitment asap to receive additional medications.    Norvasc 10 Mg Tabs (Amlodipine besylate) .Marland Kitchen... Take 1 tablet by mouth once a day    Metoprolol Succinate 25 Mg Tb24 (Metoprolol succinate) .Marland Kitchen... Take 1 tablet by mouth two times a day  Orders: Ophthalmology Referral (Ophthalmology)Future Orders: T-Comprehensive Metabolic Panel ([id number]) ... 11/08/2009  Problem # 3:  HYPERLIPIDEMIA (ICD-272.4) Zocor 40 mg (refill 02/2009 with 6 refills), last lipid panel 04/2008 Chol 101, LDL 39, HDL 20, Trigl 212. Will check Lipid panel today and change management accordingly.  Her updated medication list for this problem includes:    Zocor 40 Mg Tabs (Simvastatin) .Marland Kitchen... Take 1 tablet by mouth once a day  Future Orders: T-Lipid Profile ([id number]) ... 11/08/2009  Problem # 4:  LEG CRAMPS (ICD-729.82) Patient noted that she had similiar pain in the past and neurontin was helping her.  Patient noted that she was not taking it for a while. Diabetic foot exam was within normal limits concerning monofilament evaluation but pt had an visible lesion on her foot. Patient was advised to  have good foot care and consider possible podiatrist referral. Will check labs today  for possibel electrolyte abnormalties causing leg cramps. Restarted on neuronting and reevaluate at the next visit for possible changes in management.   Complete Medication List: 1)  Neurontin 300 Mg Caps (Gabapentin) .... Take 1 capsule by mouth three times a day 2)  Hydrochlorothiazide 25 Mg Tabs (Hydrochlorothiazide) .... Take 1 tablet by mouth once a day 3)  Metformin Hcl 1000 Mg Tabs (Metformin hcl) .... Take 1 tablet by mouth two times a day 4)  Lisinopril 40 Mg Tabs (Lisinopril) .... Take one tablet daily. you need to call the clinic at [phone] for an appoitment asap to receive additional medications. 5)  Fosamax 70 Mg Tabs (Alendronate sodium) .... Take 1 tablet by mouth once each sunday 6)  Norvasc 10 Mg Tabs (Amlodipine besylate) .... Take 1 tablet by mouth once a day 7)  Metoprolol Succinate 25 Mg Tb24 (Metoprolol succinate) .... Take 1 tablet by mouth two times a day 8)  Zocor 40 Mg Tabs (Simvastatin) .... Take 1 tablet by mouth once a day 9)  Paxil 20 Mg Tabs (Paroxetine hcl) .... Take one tablet  once daily. 10)  Os-cal 500 + D Tabs (Calcium carbonate-vitamin d tabs) .... Take 1 tablet by mouth three times a day 11)  Aspir-low 81 Mg Tbec (Aspirin) .... Take 1 tablet by mouth once a day 12)  Lantus Solostar 100 Unit/ml Soln (Insulin glargine) .... 24 units injected every night 13)  Bd U/f Iii Short Pen Needle 31g X 8 Mm Misc (Insulin pen needle) .... To take insulin at meals 3x/day and at bedtime 14)  Freestyle Lite Strp (Glucose blood) .... To test 6x/day before meals and 2 hours after meals 15)  Bd Ultra-fine 33 Lancets Misc (Lancets) .... To test 5x/day before meals and bedtime 16)  Novolog Flexpen 100 Unit/ml Soln (Insulin aspart) .... Inject before meals as instructed 7 or 8 units each meal 3 times daily. please call the clinic [phone] to make an appoitment 17)  Humist 0.65 % Soln (Saline) ....  Spray into each nostril 4 times a day. 18)  Sudafed 30 Mg Tabs (Pseudoephedrine hcl) .... Take one tablet in the morning for nasal congestion.  Other Orders: Flu Vaccine 53yrs + MEDICARE PATIENTS (Q6578) Administration Flu vaccine - MCR (I6962) Future Orders: T-CBC No Diff ([id number]) ... 11/08/2009  Lipid Assessment/Plan:      Based on NCEP/ATP III, the patient's risk factor category is "history of coronary disease, peripheral vascular disease, cerebrovascular disease, or aortic aneurysm along with either diabetes, current smoker, or LDL > 130 plus HDL < 40 plus triglycerides > 200".  The patient's lipid goals are as follows: Total cholesterol goal is 200; LDL cholesterol goal is 70; HDL cholesterol goal is 40; Triglyceride goal is 150.    Patient Instructions: 1)  Follow up in 1 week with Dr. Newt Lukes. Please check your sugars 3 times a day before meals and 1 time before bedtime. Bring your meter with you at the next visit. Take all your medication.  Prescriptions: NOVOLOG FLEXPEN 100 UNIT/ML  SOLN (INSULIN ASPART) inject before meals as instructed 7 or 8 units each meal 3 times daily. Please call the clinic [phone] to make an appoitment  #1 x 0   Entered and Authorized by:   Rosalia Hammers MD   Signed by:   Rosalia Hammers MD on 11/07/2009   Method used:   Print then Give to Patient   RxID:   [id number] BD ULTRA-FINE 33 LANCETS  MISC (LANCETS) to test 5x/day before meals and bedtime  #150 x 11   Entered and Authorized by:   Rosalia Hammers MD   Signed by:   Rosalia Hammers MD on 11/07/2009   Method used:   Print then Give to Patient   RxID:   [id number] FREESTYLE LITE  STRP (GLUCOSE BLOOD) to test 6x/day before meals and 2 hours after meals  #200 x 11   Entered and Authorized by:   Rosalia Hammers MD   Signed by:   Rosalia Hammers MD on 11/07/2009   Method used:   Print then Give to Patient   RxID:   [id number] BD U/F III SHORT PEN NEEDLE 31G X 8 MM MISC (INSULIN PEN  NEEDLE) to take insulin at meals 3x/day and at bedtime  #200 x 11   Entered and Authorized by:   Rosalia Hammers MD   Signed by:   Rosalia Hammers MD on 11/07/2009   Method used:   Print then Give to Patient   RxID:   [id number] LANTUS SOLOSTAR 100 UNIT/ML SOLN (INSULIN GLARGINE) 24 UNITS injected every night  #1box x 3  Entered and Authorized by:   Almyra Deforest MD   Signed by:   Almyra Deforest MD on 11/07/2009   Method used:   Print then Give to Patient   RxID:   [id number] ASPIR-LOW 81 MG TBEC (ASPIRIN) Take 1 tablet by mouth once a day  #32 x 3   Entered and Authorized by:   Almyra Deforest MD   Signed by:   Almyra Deforest MD on 11/07/2009   Method used:   Print then Give to Patient   RxID:   [id number] PAXIL 20 MG  TABS (PAROXETINE HCL) Take one tablet once daily.  #32 x 3   Entered and Authorized by:   Almyra Deforest MD   Signed by:   Almyra Deforest MD on 11/07/2009   Method used:   Print then Give to Patient   RxID:   [id number] ZOCOR 40 MG TABS (SIMVASTATIN) Take 1 tablet by mouth once a day  #32 x 3   Entered and Authorized by:   Almyra Deforest MD   Signed by:   Almyra Deforest MD on 11/07/2009   Method used:   Print then Give to Patient   RxID:   [id number] METOPROLOL SUCCINATE 25 MG TB24 (METOPROLOL SUCCINATE) Take 1 tablet by mouth two times a day  #64 Tablet x 3   Entered and Authorized by:   Almyra Deforest MD   Signed by:   Almyra Deforest MD on 11/07/2009   Method used:   Print then Give to Patient   RxID:   [id number] NORVASC 10 MG TABS (AMLODIPINE BESYLATE) Take 1 tablet by mouth once a day  #32 x 6   Entered and Authorized by:   Almyra Deforest MD   Signed by:   Almyra Deforest MD on 11/07/2009   Method used:   Print then Give to Patient   RxID:   [id number] LISINOPRIL 40 MG  TABS (LISINOPRIL) Take one tablet daily. You need to call the clinic at [phone] for an appoitment ASAP to receive additional medications.  #31 x 3    Entered and Authorized by:   Almyra Deforest MD   Signed by:   Almyra Deforest MD on 11/07/2009   Method used:   Print then Give to Patient   RxID:   [id number] METFORMIN HCL 1000 MG TABS (METFORMIN HCL) Take 1 tablet by mouth two times a day  #64 Tablet x 6   Entered and Authorized by:   Almyra Deforest MD   Signed by:   Almyra Deforest MD on 11/07/2009   Method used:   Print then Give to Patient   RxID:   [id number] NEURONTIN 300 MG CAPS (GABAPENTIN) Take 1 capsule by mouth three times a day  #90 x 3   Entered and Authorized by:   Almyra Deforest MD   Signed by:   Almyra Deforest MD on 11/07/2009   Method used:   Print then Give to Patient   RxID:   [id number]  Process Orders Check Orders Results:     [facility]: Check successful Tests Sent for requisitioning (November 19, 2009 1:21 PM):     11/07/2009: [facility] -- T-Urine Microalbumin w/creat. ratio [[id number]] (signed)     11/08/2009: [facility] -- T-Comprehensive Metabolic Panel [[id number]] (signed)     11/08/2009: [facility] -- T-CBC No Diff [[id number]] (signed)     11/08/2009: [facility] -- T-Lipid Profile [phone] (signed)    Laboratory Results   Blood Tests   Date/Time  Received: November 07, 2009 2:53 PM Date/Time Reported: Maryan Rued  November 07, 2009 2:53 PM   HGBA1C: 8.2%   (Normal Range: Non-Diabetic - 3-6%   Control Diabetic - 6-8%) CBG Random:: $RemoveBefo'129mg'UHRFDcyltAE$ /dL      Prevention & Chronic Care Immunizations   Influenza vaccine: Fluvax 3+  (11/07/2009)    Tetanus booster: 09/24/1993: Done.    Pneumococcal vaccine: Done.  (12/26/1994)    H. zoster vaccine: Not documented  Colorectal Screening   Hemoccult: Done.  (07/26/2002)    Colonoscopy: Not documented  Other Screening   Pap smear: Done.  (04/25/1998)    Mammogram: ASSESSMENT: Negative - BI-RADS 1^MM DIGITAL SCREENING UNILAT R   (05/24/2008)    DXA bone density scan: Not documented   Smoking status: never  (11/07/2009)  Diabetes Mellitus   HgbA1C: 8.2  (11/07/2009)   Hemoglobin A1C due: 10/01/2006    Eye exam: Not documented   Diabetic eye exam action/deferral: Ophthalmology referral  (11/07/2009)   Eye exam due: 06/12/2006    Foot exam: yes  (11/07/2009)   Foot exam action/deferral: Do today   High risk foot: Yes  (11/07/2009)   Foot care education: Done  (11/07/2009)   Foot exam due: 10/01/2006    Urine microalbumin/creatinine ratio: 1893.4  (05/02/2008)   Urine microalbumin action/deferral: Ordered   Urine microalbumin/cr due: 05/27/2005    Diabetes flowsheet reviewed?: Yes   Progress toward A1C goal: Deteriorated  Lipids   Total Cholesterol: 101  (05/02/2008)   LDL: 39  (05/02/2008)   LDL Direct: Not documented   HDL: 20  (05/02/2008)   Triglycerides: 212  (05/02/2008)    SGOT (AST): 24  (05/02/2008)   SGPT (ALT): 26  (05/02/2008) CMP ordered    Alkaline phosphatase: 105  (05/02/2008)   Total bilirubin: 0.6  (05/02/2008)  Hypertension   Last Blood Pressure: 136 / 76  (11/07/2009)   Serum creatinine: 0.78  (05/02/2008)   Serum potassium 4.4  (05/02/2008) CMP ordered   Self-Management Support :    Patient will work on the following items until the next clinic visit to reach self-care goals:     Medications and monitoring: take my medicines every day  (11/07/2009)    Diabetes self-management support: Written self-care plan  (11/07/2009)   Diabetes care plan printed   Last diabetes self-management training by diabetes educator: 06/07/2008   Referred for diabetes self-mgmt training.    Hypertension self-management support: Written self-care plan  (11/07/2009)   Hypertension self-care plan printed.    Lipid self-management support: Written self-care plan  (11/07/2009)   Lipid self-care plan printed.   Nursing Instructions: Diabetic foot exam today Refer for screening diabetic  eye exam (see order)  Flu Vaccine Consent Questions     Do you have a history of severe allergic reactions to this vaccine? no    Any prior history of allergic reactions to egg and/or gelatin? no    Do you have a sensitivity to the preservative Thimersol? no    Do you have a past history of Guillan-Barre Syndrome? no    Do you currently have an acute febrile illness? no    Have you ever had a severe reaction to latex? no    Vaccine information given and explained to patient? yes    Are you currently pregnant? no    Lot Number:[id number]   Exp Date:08/24/2010   Manufacturer: [employer]    Site Given  Right Deltoid IM.Mateo Flow [facility])  November 07, 2009 3:26 PM    Lipid  self-management support: Not documented    Nursing Instructions: Diabetic foot exam today  .medflu
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
## 2010-03-26 NOTE — Progress Notes (Signed)
Summary: med refill/gp  Phone Note Refill Request Message from:  Patient on September 20, 2007 4:40 PM  Refills Requested: Medication #1:  NOVOLOG FLEXPEN 100 UNIT/ML  SOLN inject before meals as instructed 7 or 8 units each meal 3x/day  Method Requested: Electronic Initial call taken by: Morrison Old RN,  September 20, 2007 4:40 PM      Prescriptions: NOVOLOG FLEXPEN 100 UNIT/ML  SOLN (INSULIN ASPART) inject before meals as instructed 7 or 8 units each meal 3x/day  #1 box x 6   Entered and Authorized by:   Myrtis Ser MD   Signed by:   Myrtis Ser MD on 09/22/2007   Method used:   Electronically sent to ...       [facility] [address] [phone]*       [place]       [place], [place]  [address]       Ph: [phone] or [phone]       Fax: [phone]   RxID:   [id number]  refilled

## 2010-03-26 NOTE — Progress Notes (Signed)
Summary: follow up/dmr  Phone Note Outgoing Call   Call placed by: [name] RD,CDE,  January 01, 2010 3:25 PM Summary of Call: called to follow-up on new dinner dose and hypoglycemia: patient reports she has had no lows blood sugars after dinner, but a few more low blood sugars without any symptoms- 68 before dinner last monday,then  went up to 223 - took Novolog and bedtime was reading was 159 other fasting readings:  97,  125, 70, yesterday it was .  will route to her doctor for review.  Follow-up for Phone Call        Unfortunately I have never seen pt so I may not be best person to address currently issue. Didn't keep F/U appt after seeing Illath. I sent a flag to Ms Cyndi Bender to schedule an appt at last refill request. Seems that fasting CBG OK. Issue is occ asymptomatic low pre dinner. It appears you adjusted pre meal insulin based on CBG. Do you feel anything else indicated prior to her next appt? Thanks Follow-up by: Larey Dresser MD,  January 01, 2010 5:13 PM  Additional Follow-up for Phone Call Additional follow up Details #1::        no, I do not think any change is required. I think she's okay and she agreed. She said she'd call for questions or concerns Additional Follow-up by: [name] RD,CDE,  January 02, 2010 9:05 AM

## 2010-03-26 NOTE — Assessment & Plan Note (Signed)
Summary: DM TRAINING/VS   Vital Signs:  Patient profile:   65 year old female Height:      67 inches Weight:      189.3 pounds BMI:     29.76 Is Patient Diabetic? Yes   Allergies: No Known Drug Allergies   Complete Medication List: 1)  Neurontin 300 Mg Caps (Gabapentin) .... Take 1 capsule by mouth three times a day 2)  Hydrochlorothiazide 25 Mg Tabs (Hydrochlorothiazide) .... Take 1 tablet by mouth once a day 3)  Metformin Hcl 1000 Mg Tabs (Metformin hcl) .... Take 1 tablet by mouth two times a day 4)  Lisinopril 40 Mg Tabs (Lisinopril) .... Take one tablet daily. 5)  Fosamax 70 Mg Tabs (Alendronate sodium) .... Take 1 tablet by mouth once each sunday 6)  Norvasc 10 Mg Tabs (Amlodipine besylate) .... Take 1 tablet by mouth once a day 7)  Metoprolol Succinate 25 Mg Tb24 (Metoprolol succinate) .... Take 1 tablet by mouth two times a day 8)  Zocor 40 Mg Tabs (Simvastatin) .... Take 1 tablet by mouth once a day 9)  Paxil 20 Mg Tabs (Paroxetine hcl) .... Take one tablet once daily. 10)  Os-cal 500 + D Tabs (Calcium carbonate-vitamin d tabs) .... Take 1 tablet by mouth three times a day 11)  Aspir-low 81 Mg Tbec (Aspirin) .... Take 1 tablet by mouth once a day 12)  Lantus Solostar 100 Unit/ml Soln (Insulin glargine) .... 24 units injected every night 13)  Bd U/f Iii Short Pen Needle 31g X 8 Mm Misc (Insulin pen needle) .... To take insulin at meals 3x/day and at bedtime 14)  Freestyle Lite Strp (Glucose blood) .... To test 6x/day before meals and 2 hours after meals 15)  Bd Ultra-fine 33 Lancets Misc (Lancets) .... To test 5x/day before meals and bedtime 16)  Novolog Flexpen 100 Unit/ml Soln (Insulin aspart) .... Inject before meals as instructed 7 or 8 units each meal 3x/day  Other Orders: DSMT(Medicare) Individual, 30 Minutes (J8119)   Diabetes Self Management Training  PCP:  Myrtis Ser MD Referring MD: Volanda Napoleon Date diagnosed with diabetes: 04/25/1990 Diabetes Type: Type 2  insulin treated Other persons present: no Current smoking Status: never  Vital Signs Todays Weight: 189.3lb  Todays Height: 67in   BMI 29.76in-lbs   Assessment Daily activities: sedentary, wants to be more active, but transportation is a problem as well as loose stools Sources of Support: daughters Special needs or Barriers: disabled from previous stroke # of people in household: 1  Diabetes Medications:  Herbs or Supplements:              No Comments: taking 8 units novolog unless over 150 before meals. adds 1 unit for every 50 points above 150 mg/dl. See meter download- CBgs better controlled with averages mostly in 100s  discussed alternative to increasing insulin to prevent weight gain and continue with improved control- decreasing carbs at meals only only eating protein and vegetables    Monitoring Self monitoring blood glucose 6+ times a day Name of [place] for Low Blood sugar Yes   Can you tell if your blood sugar is low? Yes  Time of Testing  Before meals  After meals  Recent Episodes of: Hyperglycemia : Yes Hypoglycemia: No Severe Hypoglycemia : No     Would you  say you are physically active: No  Nutritional Management Identify what foods most often affect blood glucose: Demonstrates competency Verbalize importance of controlling  food portions: Demonstrates competency State importance of spacing and not omitting meals and snacks: Demonstrates competency State changes planned for home meals/snacks: Demonstrates competency  Monitoring State purpose and frequency of monitoring BG-ketones-HgbA1C and when to contact health care team with results: Demonstrates competency State target blood glucose and HgbA1C goals: Needs review/assistance  Complications State the causes-signs and symptoms and prevention of Hyperglycemia: Demonstrates competency Explain proper treatment of hyperglycemia: Demonstrates competency State the  causes- signs and symptoms and prevention of hypoglycemia: Demonstrates competency Explain proper treatment of hypoglycemia: Demonstrates competency State sick day guidelines and when to contact health care team: Demonstrates competency State the relationship between blood glucose control and the development/prevention of long-term complications: Demonstrates competency State benefits-risks-and options for improving blood sugar control: Demonstrates competency State the relationship between blood pressure and lipid control in the prevention/control of cardiovascular disease: Demonstrates competency State the principles of skin-dental and foot care: Demonstrates competency Describe symptoms of skin and foot problems and describe foot exam: Demonstrates competency State when to seek medical advice and treatment: Demonstrates competency        likes testing more frequently wants to come back to clinic periodically for support. called [place] [employer] and [employer] to try to get her involved with theri activities at [employer] for disabled adults.   Continues to verbalize excellent comprehension of insulin and carb plan.   Diabetes Self Management Support: daughters and clinic staff Follow-up:8 weeks with meter and logbook      Last LDL:                                                 39 (05/02/2008 7:34:00 PM)

## 2010-03-26 NOTE — Progress Notes (Signed)
Summary: Refill/gh  Phone Note Refill Request Message from:  Fax from Pharmacy on November 13, 2008 10:30 AM  Refills Requested: Medication #1:  METOPROLOL SUCCINATE 25 MG TB24 Take 1 tablet by mouth two times a day   Last Refilled: 10/09/2008  Method Requested: Electronic Initial call taken by: Sander Nephew RN,  November 13, 2008 10:31 AM    Prescriptions: METOPROLOL SUCCINATE 25 MG TB24 (METOPROLOL SUCCINATE) Take 1 tablet by mouth two times a day  #64 Tablet x 6   Entered and Authorized by:   Myrtis Ser MD   Signed by:   Myrtis Ser MD on 11/14/2008   Method used:   [facility] to        [facility] [address] [phone]* (retail)       [address]       [place], [place]  [address]       Ph: [phone] or [phone]       Fax: [phone]   RxID:   [id number]

## 2010-03-26 NOTE — Progress Notes (Signed)
Summary: insulin and diabetes supply rxs/dmr  Phone Note Outgoing Call   Call placed by: [name] RD,CDE,  June 07, 2008 11:41 AM Summary of Call: called 1-800-medicar on patient's behalf to try to get her Lnatus covered. She has been automatically enrolled into a temporary program, telephone number for temp prgr [phone], can also give this number to her pharmacy per Medicare.   Follow-up for Phone Call        called pharmacy to give them the medicare temporary plan number. they already had it and said that both her Lantus and Novolog are covered and each will cost her $6.30. Note no pen needles refilled since 2008, so doubt patient taking insulin as directed.  will request a rx for insulin and pen needles Follow-up by: [name] RD,CDE,  June 07, 2008 12:08 PM  Additional Follow-up for Phone Call Additional follow up Details #1::        called patient and informed her that both insulins are covered and will cost her $6.30 each. Additional Follow-up by: [name] RD,CDE,  June 07, 2008 12:12 PM    New/Updated Medications: BD U/F III SHORT PEN NEEDLE 31G X 8 MM MISC (INSULIN PEN NEEDLE) to take insulin at meals 3x/day and at bedtime   Prescriptions: BD ULTRA-FINE 33 LANCETS  MISC (LANCETS) to test 5x/day before meals and bedtime  #150 x 11   Entered and Authorized by:   Myrtis Ser MD   Signed by:   Myrtis Ser MD on 06/08/2008   Method used:   Electronically to        [facility] [address] [phone]* (retail)       [address]       [place], [place]  [address]       Ph: [phone] or [phone]       Fax: [phone]   RxID:   [id number] LANTUS SOLOSTAR 100 UNIT/ML SOLN (INSULIN GLARGINE) 24 UNITS injected every night  #1box x 3   Entered by:   [name] RD,CDE   Authorized by:   Myrtis Ser MD   Signed by:   Myrtis Ser MD on 06/08/2008   Method used:   Electronically to        [facility] [address] [phone]*  (retail)       [address]       [place], [place]  [address]       Ph: [phone] or [phone]       Fax: [phone]   RxID:   [id number] BD U/F III SHORT PEN NEEDLE 31G X 8 MM MISC (INSULIN PEN NEEDLE) to take insulin at meals 3x/day and at bedtime  #200 x 11   Entered and Authorized by:   Myrtis Ser MD   Signed by:   Myrtis Ser MD on 06/08/2008   Method used:   Electronically to        [facility] [address] [phone]* (retail)       [address]       [place], [place]  [address]       Ph: [phone] or [phone]       Fax: [phone]   [place]:   [id number]         Would you  say you are physically active: No        Meter download  will be scanned. Pt content with current regimen. Thinks she needs to eat on better schedule. Explained that this is the beauty fo this regimen. Weight increased about 5 pounds since starting on insulin. Verbalized understanding that decreasing fat & and starch portions may help deter more weight gain and we agreed that no further weight gain is appropriate..   CBGs on 24 units lantus and 8 units Novolog: 125, 89,153,180,103,143 WE checked a 1 hour post prandial today which was 115 after eating a pretty typical meal. This is too tight of control for her living alone and current physical disabilities. Discussed this with her, provided glucose tabs.   She verbalized very good understanding to correction scaled by coming up with the example scenerio included in hte patient instruction.  F/u annually and as needed for other issues. Last LDL:                                                 39 (05/02/2008 7:34:00 PM)       Appended Document: insulin and diabetes supply rxs/dmr requetst to increase her frequency of testing   Clinical Lists Changes  Medications: Rx of FREESTYLE LITE  STRP (GLUCOSE BLOOD) to test 6x/day before meals and 2 hours after meals;  #200 x 11;  Signed;   Entered by: [name] RD,CDE;  Authorized by: Myrtis Ser MD;  Method used: Electronically to [facility] [address] [phone]*, [address] [place], [place], [place]  [address], Ph: [phone] or [phone], Fax: [phone]    Prescriptions: FREESTYLE LITE  STRP (GLUCOSE BLOOD) to test 6x/day before meals and 2 hours after meals  #200 x 11   Entered by:   [name] RD,CDE   Authorized by:   Myrtis Ser MD   Signed by:   Myrtis Ser MD on 07/10/2008   Method used:   Electronically to        [facility]  [facility] [address] [phone]* (retail)       [address]       [place], [place]  [address]       Ph: [phone] or [phone]       Fax: [phone]   RxID:   [id number]

## 2010-03-26 NOTE — Letter (Signed)
Summary: Meter Download  Meter Download   Imported By: Ollen Bowl 04/29/2006 10:42:44  _____________________________________________________________________  External Attachment:    Type:   Image     Comment:   External Document

## 2010-03-26 NOTE — Progress Notes (Signed)
Summary: refill/ hla  Phone Note Refill Request Message from:  Fax from Pharmacy on March 02, 2007 2:53 PM  Refills Requested: Medication #1:  LISINOPRIL 40 MG  TABS Take one tablet two times a day.   Last Refilled: 01/28/2007 Initial call taken by: Freddy Finner RN,  March 02, 2007 2:53 PM  Follow-up for Phone Call        Done. Follow-up by: Efraim Kaufmann MD,  March 02, 2007 2:59 PM      Prescriptions: LISINOPRIL 40 MG  TABS (LISINOPRIL) Take one tablet two times a day.  #64 x 6   Entered and Authorized by:   Efraim Kaufmann MD   Signed by:   Efraim Kaufmann MD on 03/02/2007   Method used:   Electronically sent to ...       [facility]  [facility] [address] [phone]*       [place]       [place], [place]  [address]       Ph: [phone] or [phone]       Fax: [phone]   RxID:   [id number]

## 2010-03-26 NOTE — Letter (Signed)
Summary: A1C  A1C   Imported By: Bonner Puna 06/02/2008 14:15:05  _____________________________________________________________________  External Attachment:    Type:   Image     Comment:   External Document

## 2010-03-26 NOTE — Assessment & Plan Note (Signed)
Summary: Follow-up on Lantus insulin initiation & titration  DIABETES SELF MANAGEMENT TRAINING  Support person(s) present:no Behavior change agreed on this visit: self-titrate lantus insulin as instructed today, call me on 3/9 with CBgs and insulin titration and further instructions. will follow-up in 5 weeks to download meter.    3-Nutritional Management: she verbalized good understanding and meal planning principles. 4- Medications: taking Lantus insulin 20 units at bedtime every night without fail. 5- Self-monitoring Blood Glucose: meter downloaded. lowest is am 198 and 192. diownoad will be scanned in. She verbalized need to test every morning to be able to change her lantus insulin safely with a second test alternating between  before other meals. 7-Preventing,detecting treating acute complications: reviewed hypoglycemia prevention, symptoms and treatment. 9- Psychosocial adjustment: Self-managing well. Understands need for better blood sugars, but also cautioned her about increased risk of hypoglycemia as her control becomes tighter.  Tested her CBG after eating a biscuit and it was $Remo'294mg'LtjiV$ /dl  ~ 11 am today. Discussed need to derink plenty of water.

## 2010-03-26 NOTE — Assessment & Plan Note (Signed)
Summary: DM TEACHING/CH  Is Patient Diabetic? Yes Did you bring your meter with you today? Yes CBG Result 136   Allergies: No Known Drug Allergies   Complete Medication List: 1)  Neurontin 300 Mg Caps (Gabapentin) .... Take 1 capsule by mouth three times a day 2)  Hydrochlorothiazide 25 Mg Tabs (Hydrochlorothiazide) .... Take 1 tablet by mouth once a day 3)  Metformin Hcl 1000 Mg Tabs (Metformin hcl) .... Take 1 tablet by mouth two times a day 4)  Lisinopril 40 Mg Tabs (Lisinopril) .... Take one tablet daily. you need to call the clinic at [phone] for an appoitment asap to receive additional medications. 5)  Fosamax 70 Mg Tabs (Alendronate sodium) .... Take 1 tablet by mouth once each sunday 6)  Norvasc 10 Mg Tabs (Amlodipine besylate) .... Take 1 tablet by mouth once a day 7)  Metoprolol Succinate 25 Mg Tb24 (Metoprolol succinate) .... Take 1 tablet by mouth two times a day 8)  Zocor 40 Mg Tabs (Simvastatin) .... Take 1 tablet by mouth once a day 9)  Paxil 20 Mg Tabs (Paroxetine hcl) .... Take one tablet once daily. 10)  Os-cal 500 + D Tabs (Calcium carbonate-vitamin d tabs) .... Take 1 tablet by mouth three times a day 11)  Aspir-low 81 Mg Tbec (Aspirin) .... Take 1 tablet by mouth once a day 12)  Lantus Solostar 100 Unit/ml Soln (Insulin glargine) .... 24 units injected every night 13)  Bd U/f Iii Short Pen Needle 31g X 8 Mm Misc (Insulin pen needle) .... To take insulin at meals 3x/day and at bedtime 14)  Freestyle Lite Strp (Glucose blood) .... To test 6x/day before meals and 2 hours after meals 15)  Bd Ultra-fine 33 Lancets Misc (Lancets) .... To test 5x/day before meals and bedtime 16)  Novolog Flexpen 100 Unit/ml Soln (Insulin aspart) .... Inject before meals 8 units before breakfast & lunch and 7 units before dinner. add 1 unit for each 50 mg/dl > 150 to base amount of insulin. 17)  Humist 0.65 % Soln (Saline) .... Spray into each nostril 4 times a day. 18)  Sudafed 30 Mg Tabs  (Pseudoephedrine hcl) .... Take one tablet in the morning for nasal congestion.  Other Orders: DSMT(Medicare) Individual, 30 Minutes (G0108)  Patient Instructions: 1)  Decrease your dinner insulin to 7 units for blood sugars less than 150 (your base dinner dose) 2)  Suggest occassional 2 hour after meal blood sugar test (should idealy be less than 200 mg/dl) if more think about what you ate that contained carbs at he meal before   Orders Added: 1)  DSMT(Medicare) Individual, 30 Minutes [G0108]    Diabetes Self Management Training  PCP: Larey Dresser MD Referring MD: Lynnae January Date diagnosed with diabetes: 04/25/1990 Diabetes Type: Type 2 Diabetes Mellitus Other persons present: [name]- her daughter Current smoking Status: never  Assessment Daily activities: sedentary, wants to be more active, but transportation is a problem as well as loose stools Sources of Support: daughter with whom she lives Special needs or Barriers: stroke and ? depression  Diabetes Medications:  Herbs or Supplements: No Comments: 8 units plus correction reportedly before 3 meals a day, when asked if she is forgetting some doses of Novolog- patient hesitated. [name] has two hypoglycemic events with symptoms after dinner twice in the past month. Before meal blood sugars are mostly <200 with no clear trend/pattern on meter dowload noted.  Current Insulin Use   Rapid/Short Insulin Type:Novolog Breakfast Dose: 10 am-8 units  Lunch Dose:3-4  pm- 9-10 units Dinner Dose: -7pm=8  Long Acting  Insulin Type:Lantus  Bedtime Dose: 11-12 midnight-24 units    Monitoring Self monitoring blood glucose 6+ times a day Name of Meter  Freestyle Lite  Time of Testing  Before meals  Recent Episodes of: Requiring Help from another person  Hypoglycemia: Yes   Carrys Food for Low Blood sugar Yes Can you tell if your blood sugar is low? Yes   Midmorning # of Carbs/Grams grits, oatmeal or  eggs Midafternoon # of Carbs/Grams chicken, green beans, potatoes, crystal lite Dinner # of Carbs/Grams sandwich or snack Bedtime # of Carbs/Grams ice cream, cake,crystal light, applesauce Other # of Carbs/Grams example of last night dinner portions: 2  baked chicken wings, small serving fried potatoes Would you  say you are physically active: No Diabetes Disease Process  Discussed today  Medications  Nutritional Management Identify what foods most often affect blood glucose: Needs review/assistance    State changes planned for home meals/snacks: Needs review/assistance    Monitoring State purpose and frequency of monitoring BG-ketones-HgbA1C  : Needs review/assistance   Perform glucose monitoring/ketone testing and record results correctly: Demonstrates competencyState target blood glucose and HgbA1C goals: Needs review/assistance    Complications State the causes-signs and symptoms and prevention of Hyperglycemia: Needs review/assistanceExplain proper treatment of hyperglycemia: Needs review/assistanceState the causes- signs and symptoms and prevention of hypoglycemia: Demonstrates competency   Explain proper treatment of hypoglycemia: Demonstrates competency    Exercise Diabetes Management Education Done: 12/18/2009    BEHAVIORAL GOALS INITIAL Incorporating appropriate nutritional management: try to eat at least 2-3 servings of foods with carbohydrate at each meal        See patient instructions- suggested lowered dinner dose of insulin becaue of severity of her low blood sugar. will discuss/corrdinate with her primary care physicnan and call patient if needed and as follow up.  Continues to verbalize excellent comprehension of insulin plan. needed reveiw of carbs which we did today with her daughter present to help support her.   Diabetes Self Management Support: daughters and clinic staff Follow-up:16months with meter and logbook

## 2010-03-26 NOTE — Progress Notes (Signed)
Summary: refill/wl  Phone Note Refill Request   Refills Requested: Medication #1:  FLEXERIL 10 MG TABS Take 1 tablet by mouth once each evening as needed pain   Dosage confirmed as above?Dosage Confirmed   Last Refilled: 05/07/2006  Method Requested: Fax to [facility] Initial call taken by: Claris Gladden RN,  June 08, 2006 11:15 AM  Follow-up for Phone Call        Refill approved-nurse to complete Follow-up by: Luane School MD,  June 08, 2006 11:17 AM  Additional Follow-up for Phone Call Additional follow up Details #1::        Rx faxed to pharmacy Additional Follow-up by: Claris Gladden RN,  June 08, 2006 1:06 PM    Prescriptions: FLEXERIL 10 MG TABS (CYCLOBENZAPRINE HCL) Take 1 tablet by mouth once each evening as needed pain  #30 x 1   Entered and Authorized by:   Luane School MD   Signed by:   Luane School MD on 06/08/2006   Method used:   Telephoned to ...       [facility] [address]       [place], [place]  [address]  [place]       Ph: [phone]       Fax: [phone]   RxID:   [id number]

## 2010-03-26 NOTE — Assessment & Plan Note (Signed)
Summary: mnt for diabetes/dmr   Vital Signs:  Patient Profile:   65 Years Old Female Weight:      175.7 pounds             Nutritional Status Detail 3 hours after 60-66 gram carb from soy milk, rice krispies and a banana CBG Result 282  MEDICAL NUTRITION THERAPY  Assessment:  Marcianna says she feels good about CBgs, insulin and monitoring. She doesn't want to gain weight and complained that she felt full in the abdomen today. BMs are normal, appetite without change. Good understanding of what foods are carb containing.  SF:SELTRVUYE 2 # since 2/13  Medications: no change since last visit. calculated insulin doses for basal are .8-1.2 units/kg = 32-48 units/day Blood sugars: log will be scanned, fastings are varying from 78- 187, only one CBG in PM was 207 about 6 hours post prandial 24 hour recall:  Exercise: finished with rehab about 3 weeks ago, goes out to church 3 days/week & out periodically with daughters.   Diagnosis:  NI 53.4 - Inconsistent carb intake as related to food & nutrition related knowledge deficit  as evidenced by reported variations on carb intake at breakfast.  NB 1.4  -self-monitoring deficit as related to lack of focus and attention to detail as well as needing time to adapt to new skills as evidenced by lack of CBgs at other times of day despite previous request to test at other times to evaluate adequacy of oral hypoglycemic medication.   Intervention:  1- Discussed how to interpret high CBG by keeping records of activity and food intake. She agreed to test blood sugar  ~ 2hours after breakfast at least 3x in the next 2 weeks & record in book 2- Reviewed label reading for carb content of foods, discussed how this can help her control her CBgs and understand how food affects CBGs.   She agreed to record what she eats and carb content of one meal in the next 2 weeks. 3- Discussed affects of physical activity as we suspect that her fasting CBGs are lower the  day after she has had a more strenuous day. Have asked her to notate days when she has  more activity in her log book to track these trends.   Follow-up: she is to follow up in 6 weeks with me, have asked her to have post meal CBgs ready for MD visit in 2 weeks so they can use this along with A1C results to evaluate her diabetes control and need for mealtime insulin.

## 2010-03-26 NOTE — Progress Notes (Signed)
Summary: med refill/gp  Phone Note Refill Request Message from:  Fax from Pharmacy on September 27, 2009 10:11 AM  Refills Requested: Medication #1:  ASPIR-LOW 81 MG TBEC Take 1 tablet by mouth once a day   Last Refilled: 03/23/2009 Last appt. August 02, 2008.   Method Requested: Electronic Initial call taken by: Morrison Old RN,  September 27, 2009 10:11 AM  Follow-up for Phone Call        Last seen 6/10 and no showed next two visit. Will refill one month but she must make an appt to be seen. I sent flag to Ms boone Follow-up by: Larey Dresser MD,  September 27, 2009 12:37 PM    Prescriptions: ASPIR-LOW 81 MG TBEC (ASPIRIN) Take 1 tablet by mouth once a day  #32 x 0   Entered and Authorized by:   Larey Dresser MD   Signed by:   Larey Dresser MD on 09/27/2009   Method used:   [facility]        [facility] [address] [phone][facility] (retail)       [address]       [place], [place]  [address]       Ph: [phone] or [phone]       Fax: [phone]   RxID:   [id number]

## 2010-03-26 NOTE — Progress Notes (Signed)
Summary: Call placed for [name] r/t new med  Phone Note Outgoing Call   Call placed by: Pollyann Samples,  August 14, 2006 2:24 PM Call placed to: Patient Summary of Call: Called pt at request of [name] to see how the pt is doing on the new insulin. Pt stated it has helped. Her CBG was around 180 for a while and today has gone down to 150.

## 2010-03-26 NOTE — Assessment & Plan Note (Signed)
Summary: DM TRAINING/VS  Is Patient Diabetic? Yes  CBG Result 251 Comments says she took 8 units Novol0g this ma and ate 1 slice toast and unsweetened applesauce   Allergies: No Known Drug Allergies   Complete Medication List: 1)  Neurontin 300 Mg Caps (Gabapentin) .... Take 1 capsule by mouth three times a day 2)  Hydrochlorothiazide 25 Mg Tabs (Hydrochlorothiazide) .... Take 1 tablet by mouth once a day 3)  Metformin Hcl 1000 Mg Tabs (Metformin hcl) .... Take 1 tablet by mouth two times a day 4)  Lisinopril 40 Mg Tabs (Lisinopril) .... Take one tablet daily. 5)  Fosamax 70 Mg Tabs (Alendronate sodium) .... Take 1 tablet by mouth once each sunday 6)  Norvasc 10 Mg Tabs (Amlodipine besylate) .... Take 1 tablet by mouth once a day 7)  Metoprolol Succinate 25 Mg Tb24 (Metoprolol succinate) .... Take 1 tablet by mouth two times a day 8)  Zocor 40 Mg Tabs (Simvastatin) .... Take 1 tablet by mouth once a day 9)  Paxil 20 Mg Tabs (Paroxetine hcl) .... Take one tablet once daily. 10)  Os-cal 500 + D Tabs (Calcium carbonate-vitamin d tabs) .... Take 1 tablet by mouth three times a day 11)  Aspir-low 81 Mg Tbec (Aspirin) .... Take 1 tablet by mouth once a day 12)  Lantus Solostar 100 Unit/ml Soln (Insulin glargine) .... 24 units injected every night 13)  Bd U/f Iii Short Pen Needle 31g X 8 Mm Misc (Insulin pen needle) .... To take insulin at meals 3x/day and at bedtime 14)  Freestyle Lite Strp (Glucose blood) .... To test 5x/day before meals and bedtime 15)  Bd Ultra-fine 33 Lancets Misc (Lancets) .... To test 5x/day before meals and bedtime 16)  Novolog Flexpen 100 Unit/ml Soln (Insulin aspart) .... Inject before meals as instructed 7 or 8 units each meal 3x/day  Other Orders: DSMT(Medicare) Individual, 30 Minutes (A1287)   Diabetes Self Management Training  PCP:  Myrtis Ser MD Referring MD: Volanda Napoleon Date diagnosed with diabetes: 04/25/1990 Diabetes Type: Type 2 insulin  treated Other persons present: Other family-daughter eva and her son elisha Current smoking Status: never  Assessment Daily activities: sedentary Sources of Support: daughters Years of school completed: 9th grade education Knowledge Deficits: forgets easily per patient Special needs or Barriers: insurance not paying for Lantus and last time she filled Novolog per refill history was 08/2007 for 1 box whihc is onnly 1500 units- this means on average she has only been using  ~ 6 units Novolog per day x 8 months Affect: Appropriate  Potential Barriers  Risk for falls  Transportation  Economic/Supplies  Unsteady Gait  Diabetes Medications:  Herbs or Supplements:              No Comments: a second meter given to patient today to use as back-up. meter download noted that testing is only being done fasting, she reports taking Novolog before ech meal  ~ 8 units except when she forgets, then she says she takes it as soon as she remembers. Note refill history does not support this. Has not refilled Novolog enough    Recent Episodes of: Hypoglycemia: Yes      Activity Limitations  Appropriate physical activity Would you  say you are physically active: No  Barriers  Physical limitations  Monitoring State purpose and frequency of monitoring BG-ketones-HgbA1C and when to contact health care team with results: Needs review/assistance Perform glucose monitoring/ketone testing and record results correctly: Demonstrates competency State target blood  glucose and HgbA1C goals: Needs review/assistance  Complications State the causes-signs and symptoms and prevention of Hyperglycemia: Needs review/assistance Explain proper treatment of hyperglycemia: Needs review/assistance State the causes- signs and symptoms and prevention of hypoglycemia: Demonstrates competency Explain proper treatment of hypoglycemia: Demonstrates competency State sick day guidelines and when to contact health care  team: Demonstrates competency State the relationship between blood glucose control and the development/prevention of long-term complications: Demonstrates competency State benefits-risks-and options for improving blood sugar control: Demonstrates competency State the relationship between blood pressure and lipid control in the prevention/control of cardiovascular disease: Demonstrates competency State the principles of skin-dental and foot care: Demonstrates competency Describe symptoms of skin and foot problems and describe foot exam: Demonstrates competency State when to seek medical advice and treatment: Demonstrates competency  Psychosocial Adjustment Identify how stress affects diabetes & two sources of stress: Demonstrates competency Name two ways of obtaining support from family/friends: Demonstrates competency Diabetes Management Education Done: 06/07/2008    BEHAVIORAL GOALS INITIAL Monitoring blood glucose levels daily: test blood sugar at least twice a day, record in logbook as discussed at today's visit, bring both meters to clinic appointments if you use both        Has hypoglycemic symptoms occassionally in the mornings. She does not think this is significant enough to decrease her lantus insulin.   Patient self identified goal/need to test blood sugars more frequently to find out where "problem is" as her fasting blood sugars are all between 75 and 190 for the most part.   She checked a 1.5 hour post prandial today while here in office which was 251 after eating  ~ 30 grams carbs. Reports that she took 8 units of Novolog with breakfast. Will check her insulin injection techniique at [place] visit.  Diabetes Self Management Support: daughters and clinic staff Follow-up:4 weeks with meter and logbook      Last LDL:                                                 39 (05/02/2008 7:34:00 PM)

## 2010-03-26 NOTE — Medication Information (Signed)
Summary: [employer]-FORM[employer] REQUEST FORM  [employer] REQUEST FORM   Imported By: Garlan Fillers 03/11/2007 11:39:07  _____________________________________________________________________  External Attachment:    Type:   Image     Comment:   External Document

## 2010-03-26 NOTE — Progress Notes (Signed)
Summary: Lantus self titration report  Phone Note Call from Patient Call back at Home Phone [phone]   Caller: Patient Summary of Call: Patient called to report CBGs & Lantus Titration: 2/28-342(22), 2/29 198 (22) 3-1 248 (24 units),  3-2 190 (24) 3-3 269 (26 units), 3-4 169, 3-5 196(28), 3-6 255 (28) 3-7 248 (30units), 3-8 185 (30units), 3-10 219 (32units) Congratulated her on doing an excellent job self- managing her insulin titration. Instructed her to continue for next 10 days and call at least at then end of 10 days or if she reaches goal of 100-120 mg/dl- whichever comes first. She verbalized understanding. Initial call taken by: [name],  May 04, 2006 3:12 PM

## 2010-03-26 NOTE — Assessment & Plan Note (Signed)
Summary: 53MONTH F/U/EST/VS   Vital Signs:  Patient profile:   65 year old female Height:      67 inches (170.18 cm) Weight:      188.3 pounds (85.59 kg) BMI:     29.60 Temp:     97.6 degrees F (36.44 degrees C) oral Pulse rate:   69 / minute Pulse (ortho):   73 / minute BP sitting:   144 / 71  (right arm) BP standing:   155 / 80 Cuff size:   regular  Vitals Entered By: Nadine Counts Deborra Medina) (August 02, 2008 3:50 PM)  Serial Vital Signs/Assessments:  Time      Position  BP       Pulse  Resp  Temp     By           Lying RA  155/77   71                    Kay Goldston,CMA (AAMA)           Sitting   153/82   69                    Kay Goldston,CMA (AAMA)           Standing  155/80   73                    Kay Goldston,CMA (AAMA)  CC: Depression, 53mth f/u., reck leg pain Is Patient Diabetic? Yes  Pain Assessment Patient in pain? yes     Location: HEADACHE Intensity: 6 Type: dull Onset of pain  STARTED LATE LAST NIGHT Nutritional Status BMI of 25 - 29 = overweight CBG Result 281  Have you ever been in a relationship where you felt threatened, hurt or afraid?No   Does patient need assistance? Functional Status Self care Ambulation Impaired:Risk for fall Comments walker   CC:  Depression, 60mth f/u., and reck leg pain.  History of Present Illness: 25yof with pmh outlined in EMR is here for diabetes check up.  She has been feeling generally well.  She reports taht for the past few days she has been a little light headed and dizzy.  No syncope/presyncope.  No falls.  Feeling is worse when she stands up, or bends forward.  She has had no fevers or chills.  She has a mild headache, mainly in her face.  She has a mild non-productive cough that she things is in her upper airway rather than her throat. No change in hearing or vision. No change in appetite or energy level.  Her eyes are feeling runny.  No chest pain. No other complaints.      Depression History:      The patient  denies a depressed mood most of the day and a diminished interest in her usual daily activities.  Positive alarm features for depression include fatigue (loss of energy).  However, she denies significant weight loss, significant weight gain, insomnia, hypersomnia, psychomotor agitation, psychomotor retardation, and recurrent thoughts of death or suicide.        Comments:  depressed mood on some days.   Preventive Screening-Counseling & Management  Alcohol-Tobacco     Alcohol drinks/day: <1     Alcohol type: OCCAS. BEER     Smoking Status: never     Passive Smoke Exposure: no  Current Medications (verified): 1)  Neurontin 300 Mg Caps (Gabapentin) .... Take 1 Capsule By Mouth Three Times A  Day 2)  Hydrochlorothiazide 25 Mg Tabs (Hydrochlorothiazide) .... Take 1 Tablet By Mouth Once A Day 3)  Metformin Hcl 1000 Mg Tabs (Metformin Hcl) .... Take 1 Tablet By Mouth Two Times A Day 4)  Lisinopril 40 Mg  Tabs (Lisinopril) .... Take One Tablet Daily. 5)  Fosamax 70 Mg Tabs (Alendronate Sodium) .... Take 1 Tablet By Mouth Once Each Sunday 6)  Norvasc 10 Mg Tabs (Amlodipine Besylate) .... Take 1 Tablet By Mouth Once A Day 7)  Metoprolol Succinate 25 Mg Tb24 (Metoprolol Succinate) .... Take 1 Tablet By Mouth Two Times A Day 8)  Zocor 40 Mg Tabs (Simvastatin) .... Take 1 Tablet By Mouth Once A Day 9)  Paxil 20 Mg  Tabs (Paroxetine Hcl) .... Take One Tablet Once Daily. 10)  Os-Cal 500 + D  Tabs (Calcium Carbonate-Vitamin D Tabs) .... Take 1 Tablet By Mouth Three Times A Day 11)  Aspir-Low 81 Mg Tbec (Aspirin) .... Take 1 Tablet By Mouth Once A Day 12)  Lantus Solostar 100 Unit/ml Soln (Insulin Glargine) .... 24 Units Injected Every Night 13)  Bd U/f Iii Short Pen Needle 31g X 8 Mm Misc (Insulin Pen Needle) .... To Take Insulin At Meals 3x/day and At Bedtime 14)  Freestyle Lite  Strp (Glucose Blood) .... To Test 6x/day Before Meals and 2 Hours After Meals 15)  Bd Ultra-Fine 33 Lancets  Misc (Lancets)  .... To Test 5x/day Before Meals and Bedtime 16)  Novolog Flexpen 100 Unit/ml  Soln (Insulin Aspart) .... Inject Before Meals As Instructed 7 or 8 Units Each Meal 3x/day 17)  Humist 0.65 % Soln (Saline) .... Spray Into Each Nostril 4 Times A Day. 18)  Sudafed 30 Mg Tabs (Pseudoephedrine Hcl) .... Take One Tablet in The Morning For Nasal Congestion.  Allergies (verified): No Known Drug Allergies  Review of Systems       per hpi  Physical Exam  General:  alert and well-developed.   Head:  normocephalic and atraumatic.   Eyes:  vision grossly intact, pupils equal, pupils round, and pupils reactive to light.   Ears:  cerumen occuldes view of TM's, canals are no erythmatous. Nose:  nasal dischargemucosal pallor.  clear discharge. Mouth:  good dentition and pharynx pink and moist.   Lungs:  normal respiratory effort and normal breath sounds.   Heart:  normal rate, regular rhythm, and Grade  5 /6 systolic ejection murmur.   Extremities:  no edema   Impression & Recommendations:  Problem # 1:  HYPERTENSION (ICD-401.9) BP close to goal.  No changes.  Her updated medication list for this problem includes:    Hydrochlorothiazide 25 Mg Tabs (Hydrochlorothiazide) .Marland Kitchen... Take 1 tablet by mouth once a day    Lisinopril 40 Mg Tabs (Lisinopril) .Marland Kitchen... Take one tablet daily.    Norvasc 10 Mg Tabs (Amlodipine besylate) .Marland Kitchen... Take 1 tablet by mouth once a day    Metoprolol Succinate 25 Mg Tb24 (Metoprolol succinate) .Marland Kitchen... Take 1 tablet by mouth two times a day  BP today: 144/71 Prior BP: 136/77 (06/01/2008)  Labs Reviewed: K+: 4.4 (05/02/2008) Creat: : 0.78 (05/02/2008)   Chol: 101 (05/02/2008)   HDL: 20 (05/02/2008)   LDL: 39 (05/02/2008)   TG: 212 (05/02/2008)  Problem # 2:  HYPERLIPIDEMIA (ICD-272.4) Last lipids look great. no changes. Her updated medication list for this problem includes:    Zocor 40 Mg Tabs (Simvastatin) .Marland Kitchen... Take 1 tablet by mouth once a day  Labs Reviewed: SGOT: 24  (05/02/2008)  SGPT: 26 (05/02/2008)   HDL:20 (05/02/2008), 38 (12/25/2006)  LDL:39 (05/02/2008), 52 (12/25/2006)  Chol:101 (05/02/2008), 116 (12/25/2006)  Trig:212 (05/02/2008), 131 (12/25/2006)  Problem # 3:  DIABETES MELLITUS, TYPE II (ICD-250.00) A1C is coming down nicely 7.5 from 8.5.  Continue current regimen. Her updated medication list for this problem includes:    Metformin Hcl 1000 Mg Tabs (Metformin hcl) .Marland Kitchen... Take 1 tablet by mouth two times a day    Lisinopril 40 Mg Tabs (Lisinopril) .Marland Kitchen... Take one tablet daily.    Aspir-low 81 Mg Tbec (Aspirin) .Marland Kitchen... Take 1 tablet by mouth once a day    Lantus Solostar 100 Unit/ml Soln (Insulin glargine) .Marland Kitchen... 24 units injected every night    Novolog Flexpen 100 Unit/ml Soln (Insulin aspart) ..... Inject before meals as instructed 7 or 8 units each meal 3x/day  Orders: T-Hgb A1C (in-house) (54627OJ) T- Capillary Blood Glucose (50093)  Labs Reviewed: Creat: 0.78 (05/02/2008)    Reviewed HgBA1c results: 7.5 (08/02/2008)  8.5 (05/02/2008)  Problem # 4:  ACUTE SINUSITIS, UNSPECIFIED (ICD-461.9)  symptoms of dizzyness, headache, facial fullness with negative ortho statics are consistent with sinusitis.  Will rect saline nasal spray, and and sudafed. she will call if this does not help within a day or two.  Her updated medication list for this problem includes:    Humist 0.65 % Soln (Saline) ..... Spray into each nostril 4 times a day.    Sudafed 30 Mg Tabs (Pseudoephedrine hcl) .Marland Kitchen... Take one tablet in the morning for nasal congestion.  Complete Medication List: 1)  Neurontin 300 Mg Caps (Gabapentin) .... Take 1 capsule by mouth three times a day 2)  Hydrochlorothiazide 25 Mg Tabs (Hydrochlorothiazide) .... Take 1 tablet by mouth once a day 3)  Metformin Hcl 1000 Mg Tabs (Metformin hcl) .... Take 1 tablet by mouth two times a day 4)  Lisinopril 40 Mg Tabs (Lisinopril) .... Take one tablet daily. 5)  Fosamax 70 Mg Tabs (Alendronate sodium)  .... Take 1 tablet by mouth once each sunday 6)  Norvasc 10 Mg Tabs (Amlodipine besylate) .... Take 1 tablet by mouth once a day 7)  Metoprolol Succinate 25 Mg Tb24 (Metoprolol succinate) .... Take 1 tablet by mouth two times a day 8)  Zocor 40 Mg Tabs (Simvastatin) .... Take 1 tablet by mouth once a day 9)  Paxil 20 Mg Tabs (Paroxetine hcl) .... Take one tablet once daily. 10)  Os-cal 500 + D Tabs (Calcium carbonate-vitamin d tabs) .... Take 1 tablet by mouth three times a day 11)  Aspir-low 81 Mg Tbec (Aspirin) .... Take 1 tablet by mouth once a day 12)  Lantus Solostar 100 Unit/ml Soln (Insulin glargine) .... 24 units injected every night 13)  Bd U/f Iii Short Pen Needle 31g X 8 Mm Misc (Insulin pen needle) .... To take insulin at meals 3x/day and at bedtime 14)  Freestyle Lite Strp (Glucose blood) .... To test 6x/day before meals and 2 hours after meals 15)  Bd Ultra-fine 33 Lancets Misc (Lancets) .... To test 5x/day before meals and bedtime 16)  Novolog Flexpen 100 Unit/ml Soln (Insulin aspart) .... Inject before meals as instructed 7 or 8 units each meal 3x/day 17)  Humist 0.65 % Soln (Saline) .... Spray into each nostril 4 times a day. 18)  Sudafed 30 Mg Tabs (Pseudoephedrine hcl) .... Take one tablet in the morning for nasal congestion.  Patient Instructions: 1)  Please schedule a follow-up appointment in 3 months. 2)  You should go  for a walk every day for 10-15 mintues to strengthen your heart and legs. 3)  You have new prescriptions at the drug store for your sinus congestion.  If your symptoms do not improve in a day or two call the clinic. Prescriptions: SUDAFED 30 MG TABS (PSEUDOEPHEDRINE HCL) Take one tablet in the morning for nasal congestion.  #10 x 0   Entered and Authorized by:   Myrtis Ser MD   Signed by:   Myrtis Ser MD on 08/05/2008   Method used:   [facility]        [facility] [address] [phone][facility] (retail)       [address]       [place], [place]  [address]       Ph: [phone] or [phone]       Fax: [phone]   RxID:   [id number] HUMIST 0.65 % SOLN (SALINE) Spray into each nostril 4 times a day.  #1 bottle x 0   Entered and Authorized by:   Myrtis Ser MD   Signed by:   Myrtis Ser MD on 08/05/2008   Method used:   [facility]        [facility] [address] [phone]* (retail)       [address]       [place],   [address]       Ph: [phone] or [phone]       Fax: [phone]   RxID:   [id number]   Laboratory Results   Blood Tests   Date/Time Received: August 02, 2008 4:04 PM. Date/Time Reported: Maryan Rued  August 02, 2008 4:04 PM  HGBA1C: 7.5%   (Normal Range: Non-Diabetic - 3-6%   Control Diabetic - 6-8%) CBG Random:: $RemoveBefo'281mg'OApsSNSrQKm$ /dL

## 2010-03-26 NOTE — Progress Notes (Signed)
Summary: missed appointment today  Phone Note Outgoing Call   Call placed by: [name],  June 01, 2006 10:30 AM Summary of Call: pt missed appointment today  because she thought I had said not to follow-up. I apologized and said that she needs to follow-up with MD or myself re CBGs. She did not titrate her insulin any more. last week CBGs 3/30- 143                                                  50 units 3/31- 106                                                  50 units 4/1- 167                                                    50 units 4/2- 96                    243- 11PM- ate 8 PM 4/3-78                     239-  before bed 4/4= 79   4/5-163 weight stable, appetite normal. Did feel dizzy with 78 and 79 CBGs. She verbalized good comprehension to how to treat hypoglycemia. Advised her to keep a treatment for hypoglycemia by her bedside. Asked her to check CBGs before lunch and dinner and bedtime before her next visit so we can tell if she needs different prandial coverage/insulin. Transferred her to make an appointment with MD and encouraged her to f/up with me as needed. call in testing supplies so she has enough to test at other times of day.    [place]

## 2010-03-26 NOTE — Assessment & Plan Note (Signed)
Summary: dsmt/dmr              Nutritional Status Detail CBG after eating  biscuit, grits, eggs, bacon, diet soda CBG Result 196 CBG Device ID Freestyle Lite   Current Allergies: No known allergies             Diabetes Self Management Training  PCP:  Katy Apo MD Referring MD: Prudencio Burly Date diagnosed with diabetes: 04/25/1990 Diabetes Type: Type 2 insulin treated Current smoking Status: never  Assessment Work Hours: Not currently working Affect: Appropriate  Potential Barriers  Risk for falls  Economic/Supplies  Unsteady Gait  Current Insulin Use   Rapid/Short Insulin Type:Novolog Breakfast Dose: see notes  Lunch Dose:see notes Dinner Dose: see notes  Long Acting  Insulin Type:Lantus  Bedtime Dose: see notes   Currently using Insulin Pump? No  Monitoring Self monitoring blood glucose 5 times a day Name of Roberts for Low Blood sugar No   Can you tell if your blood sugar is low? Yes  Time of Testing  Before meals  After Dinner    Last Physician foot exam:  07/01/2006 Next foot exam due: 10/01/2006 Next eye exam due: 06/12/2006  Estimated /Usual Carb Intake Breakfast # of Carbs/Grams 45-60 Lunch # of Carbs/Grams 45-60 Dinner # of Carbs/Grams 45-60 Protein: adequate protein intake Fat: Appropriate fat intake  Nutrition assessment Do you read food labels? If so what do you look at?  reviewed carb choices again today. Pt had forgotten that fruit is a carb. Reviwed typical meal intake whihc is conssitently  ~45-60 grams carb.   Activity Limitations  Appropriate physical activity Would you  say you are physically active: No  Type of physical activity  ADL's only  Barriers  Physical limitations  Medications State appropriate timing of food related to medication: Demonstrates competency Demonstrates/verbalizes site selection and rotation for injections Demonstrates competency Correctly draw up  and administer insulin-Byetta-Symlin-glucagon: Demonstrates competency State name-action-dose-duration-side effects-and time to take medication: Demonstrates competency State diabetes medication adjustments for sick days: Demonstrates competency  Describe safe needle/lancet disposal: Demonstrates competency  Nutritional Management Identify what foods most often affect blood glucose: Demonstrates competency Verbalize importance of controlling food portions: Demonstrates competency State importance of spacing and not omitting meals and snacks: Demonstrates competency State changes planned for home meals/snacks: Demonstrates competency  Monitoring State purpose and frequency of monitoring BG-ketones-HgbA1C and when to contact health care team with results: Demonstrates competency Perform glucose monitoring/ketone testing and record results correctly: Demonstrates competency State target blood glucose and HgbA1C goals: Needs review/assistance  Complications State the causes-signs and symptoms and prevention of Hyperglycemia: Demonstrates competency Explain proper treatment of hyperglycemia: Demonstrates competency State the causes- signs and symptoms and prevention of hypoglycemia: Demonstrates competency Explain proper treatment of hypoglycemia: Demonstrates competency State sick day guidelines and when to contact health care team: Demonstrates competency State benefits-risks-and options for improving blood sugar control: Demonstrates competency  Preconception care-Pregnancy-GDM management ( if applicable)                                                                      Not applicable Date of Diabetic Education: 08/06/2006    BEHAVIORAL GOALS INITIAL Utilizing medications if for therapeutic effectiveness: take  mealtime insulin as directed Monitoring blood glucose levels daily: monitor more often on mealtime insulin        Spoke with Dr. Prudencio Burly about the follwoign today and at her  last visit.  Stop Glipizide after today (08-06-06)  Please try to eat 45-60 grams carb (3-4 servings) at each meal while trying to see how much insulin you need.  Please check your blood sugar before each meal and at bedtime.  Call me if you begin to show any readings under 90 or if you have symptoms of hypoglycemia. If you begin to have some readings before meals that are in the target range of  90  to 150 mg/dl,  Hold the current dose, MAKE NO FURTHER CHANGES..  Decrease your Lantus dose tonight to __18__ units at bedtime   If you are going to exercise 2-3 hours after meal, decrease your mealtime insulin by half. Suggest not skipping meals, but instead eating lighter carb choices, but if you do skip meal altogether, skip mealtime insulin.  Change insulin date     Units of pre-meal Novolog                                  5-15 minutes before meals   Units of Lantus at bedtime Thursday 08-06-06                                                      18 units Friday   08-07-06       7 units x 3                              18 units Saturday 08-08-06       7 units x 3                              18 units $Remove'Sunday 08-09-06       7 units x 3                             18 units Monday 08-10-06       7 units x 3                              21 units Tuesday 08-11-06       7 units x 3                             21 units Wednesday 08-12-06    7 units x 3                              21 units Thursday 08-13-06       7 units x 3                              21'orlUiea$  units Friday   08-14-06       8 units x 3  21 units Saturday 08-15-06       8 units x 3                             21 units $Remove'Sunday 08-16-06       8 units x 3                             21 units Monday 08-17-06       8 units x 3                             24 units Tuesday 08-18-06       8 units x 3                             24 units Wednesday 08-19-06     8 units x 3                             24 units Thursday 08-20-06         8 units x 3                             24'OCNLcPc$  units             Appended Document: dsmt/dmr Pt had run out of lantus last night and new to Novolog.   Clinical Lists Changes  Medications: Removed medication of GLIPIZIDE 10 MG TABS (GLIPIZIDE) Take 1 tablet by mouth two times a day Added new medication of NOVOLOG FLEXPEN 100 UNIT/ML  SOLN (INSULIN ASPART) inject before meals as instructed 7 or 8 units each meal 3x/day Changed medication from BD U/F III SHORT PEN NEEDLE 31G X 8 MM MISC (INSULIN PEN NEEDLE) to take insulin at bedtime to BD U/F III SHORT PEN NEEDLE 31G X 8 MM MISC (INSULIN PEN NEEDLE) to take insulin at meals 3x/day and at bedtime - Signed Changed medication from LANTUS SOLOSTAR 100 UNIT/ML SOLN (INSULIN GLARGINE) 50 UNITS injected every night. to LANTUS SOLOSTAR 100 UNIT/ML SOLN (INSULIN GLARGINE) 18-24 UNITS injected every night. Changed medication from FREESTYLE LITE  STRP (GLUCOSE BLOOD) to test 3x/day before meals to FREESTYLE LITE  STRP (GLUCOSE BLOOD) to test 5x/day before meals and bedtime Changed medication from BD ULTRA-FINE 33 LANCETS  MISC (LANCETS) to test 3x/day before meals to BD ULTRA-FINE 33 LANCETS  MISC (LANCETS) to test 5x/day before meals and bedtime Rx of BD U/F III SHORT PEN NEEDLE 31G X 8 MM MISC (INSULIN PEN NEEDLE) to take insulin at meals 3x/day and at bedtime;  #200 x 7;  Signed;  Entered by: [name];  Authorized by: Katy Apo MD;  Method used: [facility] [address] [place], [place], [place]  [address], Ph: [phone] or [phone], Fax: [phone]

## 2010-03-26 NOTE — Progress Notes (Signed)
Summary: needs lantus/dmr  Phone Note Call from Patient   Caller: Patient Summary of Call: Patient called wanting to know what happened with her insulin approval. She only has 2 more lantus pens left. See that my note about this issue from 02/11/07 was signed, but issue never resolved that I am aware of.  Will forward to [facility] for assist Initial call taken by: [name],  March 02, 2007 5:23 PM  Follow-up for Phone Call        Discussed with Ronny Bacon. [employer] [phone] to obtain the Exception form. They again said they'd fax the exception form within 2 hours today to [phone]. Follow-up by: [name],  March 04, 2007 12:32 PM  Additional Follow-up for Phone Call Additional follow up Details #1::        Received form. Dr Volanda Napoleon completed, it was faxed back to [facility] and put in "to be scanned" box. Pt called and informed of same. Additional Follow-up by: [name],  March 04, 2007 1:48 PM

## 2010-03-26 NOTE — Progress Notes (Signed)
Summary: needs refill of lantus solostar  Phone Note Call from Patient Call back at Home Phone [phone]   Caller: Patient Summary of Call: pt called to report new Lantus dose & CBGs. she is currently taking 48 units of Lantus at bedtime. Fasting CBGs are [phone]. weight stable, less thirst, more energy. she needs refills which I will call in.Have asked her to keep titrating Lantus until fasting 100-120 and to call me with that dose to enter in EMR Initial call taken by: [name],  May 20, 2006 4:38 PM  New/Updated Medications: LANTUS SOLOSTAR 100 UNIT/ML SOLN (INSULIN GLARGINE) 50 UNITS injected every night. BD U/F III SHORT PEN NEEDLE 31G X 8 MM MISC (INSULIN PEN NEEDLE) to take insulin at bedtime  New/Updated Medications: LANTUS SOLOSTAR 100 UNIT/ML SOLN (INSULIN GLARGINE) 50 UNITS injected every night. BD U/F III SHORT PEN NEEDLE 31G X 8 MM MISC (INSULIN PEN NEEDLE) to take insulin at bedtime  Prescriptions: BD U/F III SHORT PEN NEEDLE 31G X 8 MM MISC (INSULIN PEN NEEDLE) to take insulin at bedtime  #1 box x 3   Entered by:   [name]   Authorized by:   Katy Apo MD   Signed by:   Katy Apo MD on 05/26/2006   Method used:   Telephoned to ...       [facility]       [address]       [place], [place]  [address]  [place]       Ph: [phone]       Fax: [phone]   RxID:   [id number] LANTUS SOLOSTAR 100 UNIT/ML SOLN (INSULIN GLARGINE) 50 UNITS injected every night.  #1 box x 3   Entered by:   [name]   Authorized by:   Katy Apo MD   Signed by:   Katy Apo MD on 05/26/2006   Method used:   Telephoned to ...       [facility] [address]       [place], [place]  [address]  [place]       Ph: [phone]       Fax: [phone]   RxID:   [id number]

## 2010-03-26 NOTE — Assessment & Plan Note (Signed)
Summary: EST NEEDS CHECKUP/CH   Vital Signs:  Patient Profile:   65 Years Old Female Weight:      173.7 pounds (78.95 kg) Temp:     98.0 degrees F (36.67 degrees C) oral Pulse rate:   78 / minute BP sitting:   139 / 82  (right arm)  Pt. in pain?   no  Vitals Entered By: Filomena Jungling (April 08, 2006 2:14 PM)              Is Patient Diabetic? Yes  Nutritional Status Normal  Does patient need assistance? Ambulation Normal   Chief Complaint:  check-up.  History of Present Illness: [name] is here for a scheduled follow up clinic visit, mostly related to diabetes care.  Overall she reports feeling well.  She has no complaints.  Her cough, which she had in December, is resolved.  She denies any difficulties taking her medications as perscribed.  Unfortunately, she has not brought her medication bottles with her today.    Prior Medications: NEURONTIN 300 MG CAPS (GABAPENTIN) Take 1 capsule by mouth three times a day HYDROCHLOROTHIAZIDE 25 MG TABS (HYDROCHLOROTHIAZIDE) Take 1 tablet by mouth once a day METFORMIN HCL 1000 MG TABS (METFORMIN HCL) Take 1 tablet by mouth two times a day GLIPIZIDE 10 MG TABS (GLIPIZIDE) Take 1 tablet by mouth two times a day LISINOPRIL 20 MG TABS (LISINOPRIL) Take 2 tablets by mouth every morning and 1 tablet every evening FOSAMAX 70 MG TABS (ALENDRONATE SODIUM) Take 1 tablet by mouth once each Sunday NORVASC 10 MG TABS (AMLODIPINE BESYLATE) Take 1 tablet by mouth once a day METOPROLOL SUCCINATE 25 MG TB24 (METOPROLOL SUCCINATE) Take 1 tablet by mouth two times a day ZOCOR 40 MG TABS (SIMVASTATIN) Take 1 tablet by mouth once a day PAXIL 10 MG TABS (PAROXETINE HCL) Take 1 tablet by mouth once a day FLEXERIL 10 MG TABS (CYCLOBENZAPRINE HCL) Take 1 tablet by mouth once each evening as needed pain OS-CAL 500 + D  TABS (CALCIUM CARBONATE-VITAMIN D TABS) Take 1 tablet by mouth three times a day ASPIR-LOW 81 MG TBEC (ASPIRIN) Take 1 tablet by mouth once a  day Current Allergies (reviewed today): No known allergies  Current Medications (including changes made in today's visit):  NEURONTIN 300 MG CAPS (GABAPENTIN) Take 1 capsule by mouth three times a day HYDROCHLOROTHIAZIDE 25 MG TABS (HYDROCHLOROTHIAZIDE) Take 1 tablet by mouth once a day METFORMIN HCL 1000 MG TABS (METFORMIN HCL) Take 1 tablet by mouth two times a day GLIPIZIDE 10 MG TABS (GLIPIZIDE) Take 1 tablet by mouth two times a day LISINOPRIL 20 MG TABS (LISINOPRIL) Take 2 tablets by mouth every morning and 1 tablet every evening FOSAMAX 70 MG TABS (ALENDRONATE SODIUM) Take 1 tablet by mouth once each Sunday NORVASC 10 MG TABS (AMLODIPINE BESYLATE) Take 1 tablet by mouth once a day METOPROLOL SUCCINATE 25 MG TB24 (METOPROLOL SUCCINATE) Take 1 tablet by mouth two times a day ZOCOR 40 MG TABS (SIMVASTATIN) Take 1 tablet by mouth once a day PAXIL 10 MG TABS (PAROXETINE HCL) Take 1 tablet by mouth once a day FLEXERIL 10 MG TABS (CYCLOBENZAPRINE HCL) Take 1 tablet by mouth once each evening as needed pain OS-CAL 500 + D  TABS (CALCIUM CARBONATE-VITAMIN D TABS) Take 1 tablet by mouth three times a day ASPIR-LOW 81 MG TBEC (ASPIRIN) Take 1 tablet by mouth once a day LANTUS SOLOSTAR 100 UNIT/ML SOLN (INSULIN GLARGINE) 20 UNITS injected every night.     Risk  Factors:  Tobacco use:  never   Review of Systems  CV      Denies chest pain or discomfort and fatigue.  Resp      Denies cough and shortness of breath.  GI      Denies change in bowel habits, loss of appetite, and nausea.  Endo      Denies excessive thirst, excessive urination, and polyuria.   Physical Exam  General:     alert, well-developed, and well-nourished.   Head:     normocephalic, atraumatic, and no abnormalities observed.   Eyes:     vision grossly intact, pupils equal, pupils round, and pupils reactive to light.   Neck:     supple and no masses.   Chest Wall:     well healed sternotomy scar Lungs:      normal respiratory effort, no accessory muscle use, normal breath sounds, no crackles, and no wheezes.   Heart:     normal rate, regular rhythm, no murmur, no gallop, no rub, and no JVD.   Abdomen:     soft, non-tender, normal bowel sounds, no distention, and no masses.  soft, non-tender, normal bowel sounds, no distention, and no masses.   Msk:     no joint tenderness, no joint swelling, and no redness over joints.  no joint tenderness, no joint swelling, and no redness over joints.   Extremities:     1+ left pedal edema.  1+ left pedal edema and 1+ right pedal edema.   Neurologic:     alert & oriented X3.  alert & oriented X3.   Cervical Nodes:     no anterior cervical adenopathy.  no anterior cervical adenopathy.   Psych:     Oriented X3.  Oriented X3 and flat affect.      Impression & Recommendations:  Problem # 1:  DIABETES MELLITUS, TYPE II (ICD-250.00) Unfortunately her HbA1C is 11.8 today; with a random CBG of 338.  I think at this point, as the patient reports taking all of her oral hypoglycemics, [name] requires insulin  therapy.  I have discussed this patient's situation with [name] who met with her earlier this winter in regards to starting Lantus.  [name] is understanding of this plan and is agreeable to moving forward with it.  She met with [name] today for approximately 30 minutes to for initiation of Lantus injections.  She had a dilated fundoscopic examination this summer (she can't remember who performed it), which she reports was "normal" - she has no past history of diabetic retinopathy.  Her updated medication list for this problem includes:    Metformin Hcl 1000 Mg Tabs (Metformin hcl) .Marland Kitchen... Take 1 tablet by mouth two times a day    Glipizide 10 Mg Tabs (Glipizide) .Marland Kitchen... Take 1 tablet by mouth two times a day    Lisinopril 20 Mg Tabs (Lisinopril) .Marland Kitchen... Take 2 tablets by mouth every morning and 1 tablet every evening    Aspir-low 81 Mg Tbec  (Aspirin) .Marland Kitchen... Take 1 tablet by mouth once a day    Lantus Solostar 100 Unit/ml Soln (Insulin glargine) .Marland Kitchen... 20 units injected every night.  Orders: T-Hgb A1C (in-house) ([id number]) T- Capillary Blood Glucose (50093)   Problem # 2:  CAROTID ARTERY STENOSIS, RIGHT (ICD-433.10) In completeing her preload, I noted that she was supposed to follow up with vascular surgery regarding her right sided carotid stenosis.  She has not yet done this, so I will refer her  today for this.  I've also updated her medication list to reflect the fact that she is taking an aspirin daily. Her updated medication list for this problem includes:    Aspir-low 81 Mg Tbec (Aspirin) .Marland Kitchen... Take 1 tablet by mouth once a day  Orders: Vascular Clinic (Vascular)   Problem # 3:  HYPERTENSION (ICD-401.9) Blood pressure today is 139/82 which is much improved from my prior visits with her ( ~160/90).  We will continue these medications as perscribed.  We will need to check a BMET given the medicaitons she is on Her updated medication list for this problem includes:    Hydrochlorothiazide 25 Mg Tabs (Hydrochlorothiazide) .Marland Kitchen... Take 1 tablet by mouth once a day    Lisinopril 20 Mg Tabs (Lisinopril) .Marland Kitchen... Take 2 tablets by mouth every morning and 1 tablet every evening    Norvasc 10 Mg Tabs (Amlodipine besylate) .Marland Kitchen... Take 1 tablet by mouth once a day    Metoprolol Succinate 25 Mg Tb24 (Metoprolol succinate) .Marland Kitchen... Take 1 tablet by mouth two times a day  BP today: 139/82  Orders: T-Basic Metabolic Panel ([id number])   Problem # 4:  DEPRESSION (ICD-311) Her affect is still flat, but overall it is improved from prior visits.  I spoke to [name] who had found out that the patient has only recently started taking paxil again after being off of it for some time.  I will continue this medication for the time being, as she seems to be doing well with it. Her updated medication list for this problem includes:    Paxil 10 Mg  Tabs (Paroxetine hcl) .Marland Kitchen... Take 1 tablet by mouth once a day   Problem # 5:  CORONARY ARTERY DISEASE (ICD-414.00) She is on a B blocker, ACE inhibitor, and aspirin.  She is not receiving statin therapy.  I am reviewing her chart and do not see a fasting lipid panel in our records.  We will need to schedule her to return for this on a morning that is convenient for her.    Her updated medication list for this problem includes:    Hydrochlorothiazide 25 Mg Tabs (Hydrochlorothiazide) .Marland Kitchen... Take 1 tablet by mouth once a day    Lisinopril 20 Mg Tabs (Lisinopril) .Marland Kitchen... Take 2 tablets by mouth every morning and 1 tablet every evening    Norvasc 10 Mg Tabs (Amlodipine besylate) .Marland Kitchen... Take 1 tablet by mouth once a day    Metoprolol Succinate 25 Mg Tb24 (Metoprolol succinate) .Marland Kitchen... Take 1 tablet by mouth two times a day    Aspir-low 81 Mg Tbec (Aspirin) .Marland Kitchen... Take 1 tablet by mouth once a day   Problem # 6:  ADENOCARCINOMA, BREAST, LEFT (ICD-174.9) The patient continues to follow up at [facility] hospital for mammography of the right breast.  I'm reviewing her chart - she had a  R mammorgram in 03/2005 and was recommended to follow up in 11/2005.  The patient reports that she has not returned since February 2007.  I will refer her again for annualy mammogram as recommended.   Orders: Mammogram (Mammogram)   Medications Added to Medication List This Visit: 1)  Aspir-low 81 Mg Tbec (Aspirin) .... Take 1 tablet by mouth once a day 2)  Lantus Solostar 100 Unit/ml Soln (Insulin glargine) .... 20 units injected every night.  Other Orders: Lipid Profile [id number])   Patient Instructions: 1)  Please return to clinic in 3 months time.  We will check your A1C level at that time to  see how the new Lantus is helping your diabetes. 2)  Please schedule a follow-up appointment in 3 months. 3)  The importance of monitoring the HgBA1c level regularly was reviewed.  4)  The importance of proper foot care and  regularly checking feet to prevent sores and possibly loss of limbs was reviewed . 5)  Stressed importance of keeping appointment as knowing more about diabetes and how to manage diabetes can help prevent its complications . 6)  Be sure to return for a FASTING Lipid Profile and HgBA1c in 3 months.  Laboratory Results      Date/Time Recieved:  April 08, 2006 2:46 PM  Date/Time Reported:  April 08, 2006 2:46 PM ..................................................................Marland KitchenMelvia Heaps  April 08, 2006 2:46 PM   Blood Tests Glucose (random): 338 mg/dL   (Normal Range: 70-105) HGBA1C: 11.8%   (Normal Range: Non-Diabetic - 3-6%   Control Diabetic - 6-8%)   Other Tests   Appended Document: EST NEEDS CHECKUP/CH Saw pt to instruct her on the lantus Solostar Insulin pen.  Patient was able to demonstrate proper steps in using Lantus Solostar insulin pen. She demonstrated proper technique of self injection by injecting 20 units of saline in her abdomen. Patient verbalized understanding to action of Lantus insulin and to take 20 units daily at bedtime. She was instructed to test CBg every morning and bring back to clinic  in two weeks to follow up. Support persons present: daughter Inez Catalina and 2 grandchildren ..................................................................Marland KitchenBarnabas Harries  April 09, 2006 6:20 PM     Clinical Lists Changes

## 2010-03-26 NOTE — Progress Notes (Signed)
Summary: med refill/gp  Phone Note Refill Request Message from:  Fax from Pharmacy on January 31, 2008 11:32 AM  Refills Requested: Medication #1:  LANTUS SOLOSTAR 100 UNIT/ML SOLN 18-24 UNITS injected every night.   Last Refilled: 12/22/2007  Method Requested: Electronic Initial call taken by: Morrison Old RN,  January 31, 2008 11:32 AM      Prescriptions: LANTUS SOLOSTAR 100 UNIT/ML SOLN (INSULIN GLARGINE) 18-24 UNITS injected every night.  #1 mo supply x 11   Entered and Authorized by:   Myrtis Ser MD   Signed by:   Myrtis Ser MD on 01/31/2008   Method used:   [facility]        [facility]  [address] [phone]* (retail)       [address]       [place], [place]  [address]       Ph: [phone] or [phone]       Fax: [phone]   RxID:   [id number]

## 2010-03-26 NOTE — Progress Notes (Signed)
Summary: refill/gg       Phone Note Refill Request  on October 01, 2009 1:50 PM  Refills Requested: Medication #1:  NOVOLOG FLEXPEN 100 UNIT/ML  SOLN inject before meals as instructed 7 or 8 units each meal 3x/day  Method Requested: Electronic Initial call taken by: Gevena Cotton RN,  October 01, 2009 1:50 PM  Follow-up for Phone Call        See my last refill note. See needs an appt. Flag already sent to Ms Cyndi Bender but no appt set yet. Follow-up by: Larey Dresser MD,  October 01, 2009 3:08 PM    New/Updated Medications: NOVOLOG FLEXPEN 100 UNIT/ML  SOLN (INSULIN ASPART) inject before meals as instructed 7 or 8 units each meal 3 times daily. Please call the clinic [phone] to make an appoitment Prescriptions: NOVOLOG FLEXPEN 100 UNIT/ML  SOLN (INSULIN ASPART) inject before meals as instructed 7 or 8 units each meal 3 times daily. Please call the clinic [phone] to make an appoitment  #1 x 0   Entered and Authorized by:   Larey Dresser MD   Signed by:   Larey Dresser MD on 10/01/2009   Method used:   [facility]        [facility] [address] [phone]* (retail)       [address]       [place], [place]  [address]       Ph: [phone] or [phone]       Fax: [phone]   RxID:   [id number]

## 2010-03-26 NOTE — Progress Notes (Signed)
Summary: Lantus insulin exception order/dmr  Phone Note Call from Patient Call back at Home Phone [phone]   Caller: Patient Summary of Call: patient called saying she got  a letter stating that her CMS Part D insurance will not cover her lantus solostar insulin pen. she only has 2 pens left. [employer] is who sent to letter and their # is [phone] Initial call taken by: [name],  February 11, 2007 3:40 PM  Follow-up for Phone Call        called [facility]: Lantus is not on [employer], but Levemir is. Explained that [name] needs the lantus pen with her weakness due to her stroke. The insurance compnay will fax an exception order for the lantus for her physiican to complete. They will take 24 hours to reveiw once we fax it back and patient should be able to get lantus in january. Follow-up by: [name],  February 11, 2007 3:44 PM    Did this get resolved?  If not this patient needs an appointment with Ronny Bacon.

## 2010-03-26 NOTE — Letter (Signed)
Summary: Meter down load  Meter down load   Imported By: Bonner Puna 08/27/2006 15:21:54  _____________________________________________________________________  External Attachment:    Type:   Image     Comment:   External Document

## 2010-03-26 NOTE — Assessment & Plan Note (Signed)
Summary: 2 month check up   Vital Signs:  Patient Profile:   65 Years Old Female Height:     68 inches (172.72 cm) Weight:      174.1 pounds Temp:     97.9 degrees F oral Pulse rate:   72 / minute BP sitting:   145 / 82  (right arm)  Pt. in pain?   no  Vitals Entered By: Silverio Decamp (December 28, 2006 9:03 AM)              Is Patient Diabetic? Yes   Does patient need assistance? Functional Status Self care Ambulation Impaired:Risk for fall     Chief Complaint:  check-up.  History of Present Illness: [name] is a pleasant 65 yof with history of DM, CAD, CVA after valve replacement surgery, HTN, HL, L breast adenocarcinoma, and depression.  She was last seen in clinic on 9/23 (about a month ago) with a new complaint of urinary incontinence.  She was found to have a UTI at that time and was treated with bactrim.  She also reported not taking her HCTZ due to the incontinence and her BP was slightly elevated at the last visit.  Reports no more problems with incontinence.  Does have nocturia/night time frequency, which does not bother her much.  Reports she has been taking BP meds as prescribed.  Does not remember them all, does not have a list, nor has she brought them to clinic.  BP slightly high at 140/80.  Advised to bring all meds to appointments.   Reports she would like to have her paxil dose increased.  She has feelings of fear of dying, maybe panic attacks.  Feels she paxil has helped, she can control the thoughts with self talk.  They do not limit her function.  She has good social and family support.  She has been on this dose of paxil for >1 yr.  Needs refils on DM medsand supplies.    Current Allergies: No known allergies     Risk Factors: Tobacco use:  never Passive smoke exposure:  no Drug use:  no HIV high-risk behavior:  no Caffeine use:  0 drinks per day Alcohol use:  yes    Type:  OCCAS. BEER Exercise:  no Seatbelt use:  100 %  Mammogram  History:    Date of Last Mammogram:  04/29/2003  PAP Smear History:    Date of Last PAP Smear:  04/25/1998   Review of Systems  The patient denies incontinence, anorexia, fever, chest pain, and dyspnea on exhertion.     Physical Exam  General:     alert, well-developed, appropriate dress, and normal appearance.   Head:     normocephalic and atraumatic.   Neck:     supple and no masses.   Chest Wall:     no deformities.   Lungs:     normal respiratory effort and normal breath sounds.   Heart:     normal rate, regular rhythm, and Grade  6 /6 systolic ejection murmur and click secondary to aortic valve replacement. Abdomen:     soft, non-tender, and normal bowel sounds.   Msk:     LLE weakness. 4/5 RLE 5/5.  Uses cane to walk.normal ROM and no joint deformities.   Pulses:     +2 thoughout Extremities:     no edema Psych:     Oriented X3, memory intact for recent and remote, and normally interactive.  Impression & Recommendations:  Problem # 1:  DIABETES MELLITUS, TYPE II (ICD-250.00) A1C has been declining nicely with current regimen.  She expressed that she takes 24U lantus at night and 8 or more units of novolog during the day at meals.  She needs refils on both insulins and supplies.  I have written these for her.  She will return in 4 months, at that time will need an A1C.   Her updated medication list for this problem includes:    Metformin Hcl 1000 Mg Tabs (Metformin hcl) .Marland Kitchen... Take 1 tablet by mouth two times a day    Lisinopril 40 Mg Tabs (Lisinopril) .Marland Kitchen... Take one tablet two times a day.    Aspir-low 81 Mg Tbec (Aspirin) .Marland Kitchen... Take 1 tablet by mouth once a day    Lantus Solostar 100 Unit/ml Soln (Insulin glargine) .Marland KitchenMarland KitchenMarland KitchenMarland Kitchen 18-24 units injected every night.    Novolog Flexpen 100 Unit/ml Soln (Insulin aspart) ..... Inject before meals as instructed 7 or 8 units each meal 3x/day  Labs Reviewed: HgBA1c: 7.3 (11/17/2006)   Creat: 0.86 (12/25/2006)       Problem # 2:  HYPERTENSION (ICD-401.9) Slightly high today at 140/80.  Reports taking meds as prescribed.  Will increase lisinopril to 40 two times a day (total of $RemoveB'80mg'yOgPUfrl$  up from $Remov'60mg'oMEYPX$ ). She is on max dose HCTZ and norvasc already. Do not feel metoprolol would help much with BP.  Have asked her to bring meds to next apt so we can go over what they are and how she takes them.  Will get Bmet in one week to check for change in K.     Her updated medication list for this problem includes:    Hydrochlorothiazide 25 Mg Tabs (Hydrochlorothiazide) .Marland Kitchen... Take 1 tablet by mouth once a day    Lisinopril 40 Mg Tabs (Lisinopril) .Marland Kitchen... Take one tablet two times a day.    Norvasc 10 Mg Tabs (Amlodipine besylate) .Marland Kitchen... Take 1 tablet by mouth once a day    Metoprolol Succinate 25 Mg Tb24 (Metoprolol succinate) .Marland Kitchen... Take 1 tablet by mouth two times a day  Future Orders: T-Basic Metabolic Panel ([id number]) ... 01/05/2007   Problem # 3:  DEPRESSION (ICD-311) She reports having disturbing thoughts/fears of dying.  She feels she does pretty well controling these and they do not limit her activities.  She reports that paxil has helped in the past with these thoughts, but she still has them.  She's been on $Remov'10mg'NROOIq$  for over a year.  As she some but not complete resolution of symptoms I have upped the dose to $Remov'20mg'QLvxZq$ .  Told her that it wouldn't work for a week or two, and she might even have increased symptoms.  If she does, she should call and make an appointment ASAP.   Her updated medication list for this problem includes:    Paxil 20 Mg Tabs (Paroxetine hcl) .Marland Kitchen... Take one tablet once daily.   Problem # 4:  SYMPTOM, INCONTINENCE, MIXED, URGE/STRESS (ICD-788.33) Resolved.  Was actually 2/2 UTI, now treated.  Does have nocturia, but does not bother her much.  Will delete this item from problem list.  Problem # 5:  HYPERLIPIDEMIA (ICD-272.4)  Her updated medication list for this problem includes:    Zocor 40 Mg  Tabs (Simvastatin) .Marland Kitchen... Take 1 tablet by mouth once a day   Complete Medication List: 1)  Neurontin 300 Mg Caps (Gabapentin) .... Take 1 capsule by mouth three times a day 2)  Hydrochlorothiazide 25 Mg  Tabs (Hydrochlorothiazide) .... Take 1 tablet by mouth once a day 3)  Metformin Hcl 1000 Mg Tabs (Metformin hcl) .... Take 1 tablet by mouth two times a day 4)  Lisinopril 40 Mg Tabs (Lisinopril) .... Take one tablet two times a day. 5)  Fosamax 70 Mg Tabs (Alendronate sodium) .... Take 1 tablet by mouth once each sunday 6)  Norvasc 10 Mg Tabs (Amlodipine besylate) .... Take 1 tablet by mouth once a day 7)  Metoprolol Succinate 25 Mg Tb24 (Metoprolol succinate) .... Take 1 tablet by mouth two times a day 8)  Zocor 40 Mg Tabs (Simvastatin) .... Take 1 tablet by mouth once a day 9)  Paxil 20 Mg Tabs (Paroxetine hcl) .... Take one tablet once daily. 10)  Flexeril 10 Mg Tabs (Cyclobenzaprine hcl) .... Take 1 tablet by mouth once each evening as needed pain 11)  Os-cal 500 + D Tabs (Calcium carbonate-vitamin d tabs) .... Take 1 tablet by mouth three times a day 12)  Aspir-low 81 Mg Tbec (Aspirin) .... Take 1 tablet by mouth once a day 13)  Lantus Solostar 100 Unit/ml Soln (Insulin glargine) .Marland KitchenMarland KitchenMarland Kitchen 18-24 units injected every night. 14)  Bd U/f Iii Short Pen Needle 31g X 8 Mm Misc (Insulin pen needle) .... To take insulin at meals 3x/day and at bedtime 15)  Freestyle Lite Strp (Glucose blood) .... To test 5x/day before meals and bedtime 16)  Bd Ultra-fine 33 Lancets Misc (Lancets) .... To test 5x/day before meals and bedtime 17)  Novolog Flexpen 100 Unit/ml Soln (Insulin aspart) .... Inject before meals as instructed 7 or 8 units each meal 3x/day 18)  Oxybutynin Chloride 5 Mg Tabs (Oxybutynin chloride) .... Take one tablet two times a day 19)  Bactrim Ds 800-160 Mg Tabs (Sulfamethoxazole-trimethoprim) .... Take one tablet two times a day for 3 days   Patient Instructions: 1)  Congratulations on your  low cholesterol! 2)  You should exercise or walk for 30 minutes a day to improve your balance and blood pressure. 3)  Please return for lab work in one week as we have changed your lisinopril dose. 4)  Your new dose of paxil will act within a few weekds. If you continue to feel depressed or have thoughts that bother you, please call and make an appointment. 5)  I will look for old records regarding your bone health and call you if you need any further testing or medications.  Right now we have no fosamax in the clinic. 6)  Please schedule a follow-up appointment in 4 months to check your blood pressure and blood sugars.    Prescriptions: NOVOLOG FLEXPEN 100 UNIT/ML  SOLN (INSULIN ASPART) inject before meals as instructed 7 or 8 units each meal 3x/day  #1 box x 6   Entered and Authorized by:   Myrtis Ser MD   Signed by:   Myrtis Ser MD on 12/28/2006   Method used:   Print then Give to Patient   RxID:   [id number] BD ULTRA-FINE 33 LANCETS  MISC (LANCETS) to test 5x/day before meals and bedtime  #150 x 6   Entered and Authorized by:   Myrtis Ser MD   Signed by:   Myrtis Ser MD on 12/28/2006   Method used:   Print then Give to Patient   RxID:   [id number] FREESTYLE LITE  STRP (GLUCOSE BLOOD) to test 5x/day before meals and bedtime  #150 x 6   Entered and Authorized by:   Myrtis Ser MD  Signed by:   Myrtis Ser MD on 12/28/2006   Method used:   Print then Give to Patient   RxID:   [id number] BD U/F III SHORT PEN NEEDLE 31G X 8 MM MISC (INSULIN PEN NEEDLE) to take insulin at meals 3x/day and at bedtime  #200 x 7   Entered and Authorized by:   Myrtis Ser MD   Signed by:   Myrtis Ser MD on 12/28/2006   Method used:   Print then Give to Patient   RxID:   [id number] LANTUS SOLOSTAR 100 UNIT/ML SOLN (INSULIN GLARGINE) 18-24 UNITS injected every night.  #1 mo supply x 6   Entered and Authorized by:   Myrtis Ser MD   Signed by:    Myrtis Ser MD on 12/28/2006   Method used:   Print then Give to Patient   RxID:   [id number] ASPIR-LOW 81 MG TBEC (ASPIRIN) Take 1 tablet by mouth once a day  #32 x 6   Entered and Authorized by:   Myrtis Ser MD   Signed by:   Myrtis Ser MD on 12/28/2006   Method used:   Print then Give to Patient   RxID:   [id number] OS-CAL 500 + D  TABS (CALCIUM CARBONATE-VITAMIN D TABS) Take 1 tablet by mouth three times a day  #96 x 6   Entered and Authorized by:   Myrtis Ser MD   Signed by:   Myrtis Ser MD on 12/28/2006   Method used:   Print then Give to Patient   RxID:   [id number] PAXIL 20 MG  TABS (PAROXETINE HCL) Take one tablet once daily.  #32 x 6   Entered and Authorized by:   Myrtis Ser MD   Signed by:   Myrtis Ser MD on 12/28/2006   Method used:   Print then Give to Patient   RxID:   [id number] METOPROLOL SUCCINATE 25 MG TB24 (METOPROLOL SUCCINATE) Take 1 tablet by mouth two times a day  #64 x 6   Entered and Authorized by:   Myrtis Ser MD   Signed by:   Myrtis Ser MD on 12/28/2006   Method used:   Print then Give to Patient   RxID:   [id number] NORVASC 10 MG TABS (AMLODIPINE BESYLATE) Take 1 tablet by mouth once a day  #32 x 6   Entered and Authorized by:   Myrtis Ser MD   Signed by:   Myrtis Ser MD on 12/28/2006   Method used:   Print then Give to Patient   RxID:   [id number] LISINOPRIL 40 MG  TABS (LISINOPRIL) Take one tablet two times a day.  #64 x 6   Entered and Authorized by:   Myrtis Ser MD   Signed by:   Myrtis Ser MD on 12/28/2006   Method used:   Print then Give to Patient   RxID:   [id number] HYDROCHLOROTHIAZIDE 25 MG TABS (HYDROCHLOROTHIAZIDE) Take 1 tablet by mouth once a day  #32 x 6   Entered and Authorized by:   Myrtis Ser MD   Signed by:   Myrtis Ser MD on 12/28/2006   Method used:   Print then Give to Patient   RxID:   [id number] ZOCOR 40 MG  TABS (SIMVASTATIN) Take 1 tablet by mouth once a day  #32 x 6   Entered and Authorized by:   Myrtis Ser MD   Signed by:   Myrtis Ser MD on 12/28/2006   Method used:   Print then Give  to Patient   RxID:   [id number] METFORMIN HCL 1000 MG TABS (METFORMIN HCL) Take 1 tablet by mouth two times a day  #64 x 6   Entered and Authorized by:   Myrtis Ser MD   Signed by:   Myrtis Ser MD on 12/28/2006   Method used:   Print then Give to Patient   RxID:   [id number] NEURONTIN 300 MG CAPS (GABAPENTIN) Take 1 capsule by mouth three times a day  #90 x 6   Entered and Authorized by:   Myrtis Ser MD   Signed by:   Myrtis Ser MD on 12/28/2006   Method used:   Print then Give to Patient   RxID:   [id number]  ]  Vital Signs:  Patient Profile:   65 Years Old Female Height:     68 inches (172.72 cm) Weight:      174.1 pounds Temp:     97.9 degrees F oral Pulse rate:   72 / minute BP sitting:   145 / 82             Last PAP Result Done. PapHx  Done. (04/25/1998 12:00:00 AM)

## 2010-03-26 NOTE — Assessment & Plan Note (Signed)
Summary: dsmt/dmr   Vital Signs:  Patient Profile:   65 Years Old Female Height:     68 inches Weight:      179. pounds BMI:     27.32             Nutritional Status Detail 1 hour post prandial with 8 units of Novolg coverage for  ~ 30 grams carb. CBG Result 115   Current Allergies: No known allergies          Patient Instructions: 1)  1-Continue with 24 units of lantus at bedtime. 2)  2- Decrease mealtime Novolog to 7 units before each meal. 3)  3- Add correction Insulin to the 7 units before meals if you test your blood sugar and are higher than 150mg /dl. 4)  4- Do not use correction dose if you haven't tested your blood sugar. 5)  5- Never use correction scacle at bedtime. 6)  Correction Rapid acting insulin dose scale. check CBG and take Novolog for the appropriate blood sugar according to the following scale three times a day before meals 7)  1 Unit rapid acting insulin lowers blood glucose 50 mg/dl(CF=1:50mg /dl) 8)  Blood Glucose  9)       <150  = no correction insulin 10)  150-200 = 1 unit 11)  201-250 = 2 units 12)  251-300 = 3 units 13)  301-350 = 4 units 14)  351-400 = 5 units 15)  401-450 = 6 units 16)  451-500 = 7 units 17)  501-550 = 8 units 18)  551-600 = 9 units 19)      >600 = 10 units 20)  Example: you ate dessert after lunch and your blood sugar is 160 before eating dinner. Add 1 unit Novolog to usual dose (7 units), so you would take 8 units of Novolog before dinner that night.      Vital Signs Todays Weight: 179.lb  Todays Height: 68in   BMI 27.32in-lbs   Assessment Work Hours: Not currently working Sources of Support: brother,daughters Affect: Appropriate Readiness to learn:   Action  Current Insulin Use   Rapid/Short Insulin Type:Novolog Breakfast Dose: 7  Lunch Dose:7 Dinner Dose: 7  Long Acting  Insulin Type:Lantus  Bedtime Dose: 24 units   Sliding Scale:given correction scale (see pt instruction) to add to usual  dose of Novolog- told only to use this correction scale premeal (never bedtime) and only if she checks her blood sugar. Correction Factor: 1 unit:50mg /dl Currently using Insulin Pump? No  Recent Episodes of: Hyperglycemia : No Hypoglycemia: No Severe Hypoglycemia : No     Estimated /Usual Carb Intake Breakfast # of Carbs/Grams 30-40 Lunch # of Carbs/Grams 30-40 Dinner # of Carbs/Grams 40-60 Protein: adequate protein intake  Nutrition assessment Weight change: Gain ETOH : No What beverages do you drink?  diet drinks Would you  say you are physically active: No  Barriers  Physical limitations  Diabetes Disease Process Define diabetes in simple terms: Demonstrates competency State own type of diabetes: Demonstrates competency State diabetes is treated by meal plan-exercise-medication-monitoring-education: Demonstrates competency  Medications State name-action-dose-duration-side effects-and time to take medication: Demonstrates competency State appropriate timing of food related to medication: Demonstrates competency Demonstrates/verbalizes site selection and rotation for injections Demonstrates competency Correctly draw up and administer insulin-Byetta-Symlin-glucagon: Demonstrates competency State name-action-dose-duration-side effects-and time to take medication: Demonstrates competency State diabetes medication adjustments for sick days: Demonstrates competency State insulin adjustment guidelines: Demonstrates competency  Describe safe needle/lancet disposal: Demonstrates competency  Nutritional Management Identify  what foods most often affect blood glucose: Demonstrates competency Verbalize importance of controlling food portions: Demonstrates competency State importance of spacing and not omitting meals and snacks: Demonstrates competency State changes planned for home meals/snacks: Demonstrates competency  Monitoring State purpose and frequency of monitoring  BG-ketones-HgbA1C and when to contact health care team with results: Demonstrates competency Perform glucose monitoring/ketone testing and record results correctly: Demonstrates competency State target blood glucose and HgbA1C goals: Demonstrates competency  Complications State the causes-signs and symptoms and prevention of Hyperglycemia: Demonstrates competency Explain proper treatment of hyperglycemia: Demonstrates competency State the causes- signs and symptoms and prevention of hypoglycemia: Demonstrates competency Explain proper treatment of hypoglycemia: Demonstrates competency State sick day guidelines and when to contact health care team: Demonstrates competency State the relationship between blood glucose control and the development/prevention of long-term complications: Demonstrates competency State benefits-risks-and options for improving blood sugar control: Demonstrates competency State the relationship between blood pressure and lipid control in the prevention/control of cardiovascular disease: Demonstrates competency State the principles of skin-dental and foot care: Demonstrates competency Describe symptoms of skin and foot problems and describe foot exam: Demonstrates competency State when to seek medical advice and treatment: Demonstrates competency  Exercise States importance of exercise: Demonstrates competency States effect of exercise on blood glucose: Garment/textile technologist safety measures for exercise related to diabetes: Demonstrates competency  Lifestyle changes:Goal setting and Problem solving State benefits of making appropriate lifestyle changes: Demonstrates competency Identify lifestyle behaviors that need to change: Demonstrates competency Identify risk factors that interfere with health: Demonstrates competency Develop strategies to reduce risk factors: Demonstrates competency Verbalize need for and frequency of health care follow-up:  Demonstrates competency Identify Family/SO role in managing diabetes: Demonstrates competency  Psychosocial Adjustment State three common feelings that might be experienced when learning to cope with diabetes: Demonstrates competency Identify two methods to cope with these feelings: Demonstrates competency Identify how stress affects diabetes & two sources of stress: Demonstrates competency Name two ways of obtaining support from family/friends: Demonstrates competency Diabetes Management Education Done: 08/20/2006 Date of Diabetic Education: 08/20/2006 Date Follow-up completed: 08/20/2006    BEHAVIORAL GOALS INITIAL Utilizing medications if for therapeutic effectiveness: Most of the time Incorporating appropriate nutritional management: Most of the time Monitoring blood glucose levels daily: 50%of the time    BEHAVIORAL GOAL FOLLOW UP Specific goal set today: take insulin according to pt instruction      Meter download will be scanned. Pt content with current regimen. Thinks she needs to eat on better schedule. Explained that this is the beauty fo this regimen. Weight increased about 5 pounds since starting on insulin. Verbalized understanding that decreasing fat & and starch portions may help deter more weight gain and we agreed that no further weight gain is appropriate..   CBGs on 24 units lantus and 8 units Novolog: 125, 89,153,180,103,143 WE checked a 1 hour post prandial today which was 115 after eating a pretty typical meal. This is too tight of control for her living alone and current physical disabilities. Discussed this with her, provided glucose tabs.   She verbalized very good understanding to correction scaled by coming up with the example scenerio included in hte patient instruction.  F/u annually and as needed for other issues.

## 2010-03-26 NOTE — Assessment & Plan Note (Signed)
Summary: Diabetes Visit: returning ASAP due to high CBG              Is Patient Diabetic? Yes  CBG Result 393      Prior Medications: NEURONTIN 300 MG CAPS (GABAPENTIN) Take 1 capsule by mouth three times a day HYDROCHLOROTHIAZIDE 25 MG TABS (HYDROCHLOROTHIAZIDE) Take 1 tablet by mouth once a day METFORMIN HCL 1000 MG TABS (METFORMIN HCL) Take 1 tablet by mouth two times a day GLIPIZIDE 10 MG TABS (GLIPIZIDE) Take 1 tablet by mouth two times a day LISINOPRIL 20 MG TABS (LISINOPRIL) Take 2 tablets by mouth every morning and 1 tablet every evening FOSAMAX 70 MG TABS (ALENDRONATE SODIUM) Take 1 tablet by mouth once each Sunday NORVASC 10 MG TABS (AMLODIPINE BESYLATE) Take 1 tablet by mouth once a day METOPROLOL SUCCINATE 25 MG TB24 (METOPROLOL SUCCINATE) Take 1 tablet by mouth two times a day ZOCOR 40 MG TABS (SIMVASTATIN) Take 1 tablet by mouth once a day PAXIL 10 MG TABS (PAROXETINE HCL) Take 1 tablet by mouth once a day FLEXERIL 10 MG TABS (CYCLOBENZAPRINE HCL) Take 1 tablet by mouth once each evening as needed pain OS-CAL 500 + D  TABS (CALCIUM CARBONATE-VITAMIN D TABS) Take 1 tablet by mouth three times a day Current Allergies: No known allergies  Diabetes Self-management Training Support person(s) present:daughter, Bonnita Nasuti, dropped her off, but didn't stay Comments:ASKED HER TO SCHEDULE WITH ME ASAP TO LEARN HOW TO USE LANTUS SOLOSTAR INSULIN PEN, Suggested she consider inviting daughter for support Behavior change agreed on this visit:test blood sugar every morning 1-Diabetes disease process: knew Type 2 2- Exercise: attending rehab for a few more weeks, suggested she consider [employer] arobics and/or water exercise. Brochure given 3-Nutritional Management:remembered carbs affected blood sugar and what foods had carbs from last visit 4- Medications: patient brought them and asked to go over what each one was for. Reviewed actions of diabetes medicinces at her request. 5-  Self-monitoring Blood Glucose: requested review of blood glucose monitoring with Freestyle Lite. Patient able to repeat demonstration after review and felt confident she'd be abel to do at home. 6- Preventing,detecting,treating Chronic Complications: not addressed today 7-Preventing,detecting treating acute complications: discussed symptoms of high blood sugar and prevention of low blood sugar. Suggest she maintain CBGs a 90-130 to prevent lows. 8- Changing habits:see above 9- Psychosocial adjustment: mood better, she reports that she was out of Paxil last visit.  ..................................................................Marland KitchenBarnabas Harries  March 19, 2006 5:17 PM

## 2010-03-26 NOTE — Letter (Signed)
Summary: MeterDownLoad  MeterDownLoad   Imported By: Bonner Puna 06/02/2008 14:15:48  _____________________________________________________________________  External Attachment:    Type:   Image     Comment:   External Document

## 2010-03-26 NOTE — Progress Notes (Signed)
Summary: refill/gg  Phone Note Refill Request  on August 19, 2007 2:11 PM  Refills Requested: Medication #1:  METOPROLOL SUCCINATE 25 MG TB24 Take 1 tablet by mouth two times a day  Method Requested: electronic Initial call taken by: Gevena Cotton RN,  August 19, 2007 2:11 PM  Follow-up for Phone Call        She should need refills on all her meds by now.  Please make an appoinment for A1C and BP check.         Appended Document: refill/gg have attempted to call pt w/no results

## 2010-03-26 NOTE — Progress Notes (Signed)
Summary: diabetes support/dmr  Phone Note Outgoing Call   Call placed by: [name] RD,CDE,  September 19, 2008 2:46 PM Summary of Call: follow up on patient request for more information about how diabetes affects the digestive system. She is no longer having problems- ( in May was having diarrhea) she has no idea why it got better. She would like information mailed to her.  Information mailed today.

## 2010-03-26 NOTE — Letter (Signed)
Summary: MeterDownLoad  MeterDownLoad   Imported By: Bonner Puna 07/10/2008 14:10:52  _____________________________________________________________________  External Attachment:    Type:   Image     Comment:   External Document

## 2010-03-26 NOTE — Progress Notes (Signed)
Summary: New to insulin follow-up  Phone Note Outgoing Call   Call placed by: [name],  April 13, 2006 5:03 PM Summary of Call: called to see how she is doing with insulin injections. She is doing fine, blood sugars are starting to come down. Reviewed storage of insulin pens per patient request. She verbalized understanding. Follow-up next week with meter to adjust insulin as needed.

## 2010-03-26 NOTE — Progress Notes (Signed)
Summary: dm testing supplies/dmr  Phone Note Call from Patient Call back at Home Phone [phone]   Caller: Patient Summary of Call: Patient called to say that her [place] is now not paying for  test strips, and that when she called [employer] she was told  that they won't pay for any test strips. I gave her the 1-800 Medicare # to call and discuss this with Medicare.   She says she still has a few strips left. Told her to cal lme back if she continues to have difficulty obtaining supplies. Initial call taken by: [name],  March 22, 2007 11:36 AM

## 2010-03-26 NOTE — Assessment & Plan Note (Signed)
Summary: REASSIGNED NEW TO DR/CFB   Vital Signs:  Patient Profile:   65 Years Old Female Height:     68 inches (172.72 cm) Weight:      177.4 pounds (80.64 kg) BMI:     27.07 Temp:     97.0 degrees F (36.11 degrees C) Pulse rate:   72 / minute BP sitting:   147 / 80  (right arm)  Pt. in pain?   no  Vitals Entered By: Morrison Old RN (November 17, 2006 1:34 PM)              Is Patient Diabetic? Yes  Nutritional Status BMI of 25 - 29 = overweight CBG Result 184  Have you ever been in a relationship where you felt threatened, hurt or afraid?No   Does patient need assistance? Functional Status Self care Ambulation Impaired:Risk for fall Comments USES CANE     Chief Complaint:  NEW MD.  History of Present Illness: [name] is a 1 yof with history of DM, CAD, CVA, HTN, HL, L breast adenocarcinoma, and depression here for assignment to new PCP.  New complaint of urinary incontinence.  She has had 8 pregnancies.  She is on HCTZ and an ACE.  She often has the urge to urniate, and sometimes can not make it to the bathroom on time.  She can not take her BP meds when she goes out because of the incontinence.  She is also interested in a R breast prosthesis to fit in her bra.  A1C is 7.3 today.  She has been checking her CBG's and is 120's in the am, fluctuates in the day time.  She has been taking the lantus at bedtime and three times a day novolog with pen injector as prescribed.  She has had no recent L breast pain as noted in previous visits.  Is not taking the flexeril or neurontin.  She has not been taking the fosamax or Ca suppliments.       Current Allergies: No known allergies     Risk Factors:  Tobacco use:  never Passive smoke exposure:  no Drug use:  no HIV high-risk behavior:  no Caffeine use:  0 drinks per day Alcohol use:  yes    Type:  OCCAS. BEER    Has patient --       Felt need to cut down:  no       Been annoyed by complaints:  no  Felt guilty about drinking:  no       Needed eye opener in the morning:  no    Counseled to quit/cut down alcohol use:  no Exercise:  no Seatbelt use:  100 %  Mammogram History:    Date of Last Mammogram:  04/29/2003  PAP Smear History:    Date of Last PAP Smear:  04/25/1998    Physical Exam  General:     alert, well-developed, well-nourished, well-hydrated, appropriate dress, normal appearance, and healthy-appearing.  alert, well-developed, well-nourished, well-hydrated, appropriate dress, normal appearance, and healthy-appearing.   Head:     normocephalic, atraumatic, and no abnormalities observed.  normocephalic, atraumatic, and no abnormalities observed.   Eyes:     vision grossly intact, pupils equal, pupils round, and pupils reactive to light.  vision grossly intact, pupils equal, pupils round, and pupils reactive to light.   Ears:     R ear normal and L ear normal.  R ear normal and L ear normal.   Nose:  no external deformity.  no external deformity.   Mouth:     good dentition and pharynx pink and moist.  good dentition and pharynx pink and moist.   Neck:     supple, full ROM, and no masses.  supple, full ROM, and no masses.   Chest Wall:     no deformities.  no deformities.   Breasts:     asymetrical.  s/p R mastectomy Lungs:     normal respiratory effort and normal breath sounds. .   Heart:     normal rate and regular rhythm.  aortic stenosis Abdomen:     soft, non-tender, and normal bowel sounds.  soft, non-tender, and normal bowel sounds.   Msk:     decreased strength R leg. Pulses:     R radial normal and L radial normal.  R radial normal and L radial normal.   Extremities:     2+ left pedal edema.  2+ left pedal edema.   Neurologic:     alert & oriented X3 and cranial nerves II-XII intact.  alert & oriented X3 and cranial nerves II-XII intact.      Impression & Recommendations:  Problem # 1:  SYMPTOM, INCONTINENCE, MIXED, URGE/STRESS  (ICD-788.33) Pt reports she often has the urge to urininate and then does not need to.  She often has the urge and does not make it to the bathroom on time.  Has had 8 pregnancies, is on a duiretic (HCTZ), and is a diabetic. Seh denies incontinence with sneezing or lifting.    UA showed large LE's.  Given bactrin for 3 days will see again in one month to be sure symptoms have cleared.   If at that time symptoms persist, will consider starting oxybutanin 5mg  two times a day.  Future Orders: T-Urinalysis Dipstick only ([id number]) ... 11/24/2006   Problem # 2:  CORONARY ARTERY DISEASE (ICD-414.00) Pt has no complaints, but is missing lab work.  Will need to check lipids and CMET, as she is on simvastatin.   Her updated medication list for this problem includes:    Hydrochlorothiazide 25 Mg Tabs (Hydrochlorothiazide) .Marland Kitchen... Take 1 tablet by mouth once a day    Lisinopril 20 Mg Tabs (Lisinopril) .Marland Kitchen... Take 2 tablets by mouth every morning and 1 tablet every evening    Norvasc 10 Mg Tabs (Amlodipine besylate) .Marland Kitchen... Take 1 tablet by mouth once a day    Metoprolol Succinate 25 Mg Tb24 (Metoprolol succinate) .Marland Kitchen... Take 1 tablet by mouth two times a day    Aspir-low 81 Mg Tbec (Aspirin) .Marland Kitchen... Take 1 tablet by mouth once a day  Future Orders: T-Lipid Profile ([id number]) ... 11/24/2006 T-Comprehensive Metabolic Panel ([id number]) ... 11/24/2006   Problem # 3:  HYPERTENSION (ICD-401.9) Pt reports sometimes not taking her medications because of urniary incontinence.  Will work to control incontinence and then re-evaluate HTN. BP today: 147/80  Her updated medication list for this problem includes:    Hydrochlorothiazide 25 Mg Tabs (Hydrochlorothiazide) .Marland Kitchen... Take 1 tablet by mouth once a day    Lisinopril 20 Mg Tabs (Lisinopril) .Marland Kitchen... Take 2 tablets by mouth every morning and 1 tablet every evening    Norvasc 10 Mg Tabs (Amlodipine besylate) .Marland Kitchen... Take 1 tablet by mouth once a day    Metoprolol  Succinate 25 Mg Tb24 (Metoprolol succinate) .Marland Kitchen... Take 1 tablet by mouth two times a day  BP today: 147/80 Prior BP: 146/85 (07/01/2006)   Problem # 4:  DIABETES MELLITUS, TYPE II (  ICD-250.00) A1C today is 7.3!!  She reports following her regimen and testing her CBG's as prescribed.  I congratulated her on her hard work to control this difficult disease.  Last A1C in June was 8.  Her goal is 7, and she is almost there.  She has been working with [name], last visit in June,  the plan is to follow up anually.   Her updated medication list for this problem includes:    Metformin Hcl 1000 Mg Tabs (Metformin hcl) .Marland Kitchen... Take 1 tablet by mouth two times a day    Lisinopril 20 Mg Tabs (Lisinopril) .Marland Kitchen... Take 2 tablets by mouth every morning and 1 tablet every evening    Aspir-low 81 Mg Tbec (Aspirin) .Marland Kitchen... Take 1 tablet by mouth once a day    Lantus Solostar 100 Unit/ml Soln (Insulin glargine) .Marland KitchenMarland KitchenMarland KitchenMarland Kitchen 18-24 units injected every night.    Novolog Flexpen 100 Unit/ml Soln (Insulin aspart) ..... Inject before meals as instructed 7 or 8 units each meal 3x/day  Orders: T- Capillary Blood Glucose ([id number]) T-Hgb A1C (in-house) ([id number])  Future Orders: T-Lipid Profile ([id number]) ... 11/24/2006  Labs Reviewed: HgBA1c: 7.3 (11/17/2006)      Problem # 5:  BREAST PAIN, RIGHT (ICD-611.71) Has not had any more symptoms.  Has not been taking neurontin or flexeril.    Complete Medication List: 1)  Neurontin 300 Mg Caps (Gabapentin) .... Take 1 capsule by mouth three times a day 2)  Hydrochlorothiazide 25 Mg Tabs (Hydrochlorothiazide) .... Take 1 tablet by mouth once a day 3)  Metformin Hcl 1000 Mg Tabs (Metformin hcl) .... Take 1 tablet by mouth two times a day 4)  Lisinopril 20 Mg Tabs (Lisinopril) .... Take 2 tablets by mouth every morning and 1 tablet every evening 5)  Fosamax 70 Mg Tabs (Alendronate sodium) .... Take 1 tablet by mouth once each sunday 6)  Norvasc 10 Mg Tabs (Amlodipine  besylate) .... Take 1 tablet by mouth once a day 7)  Metoprolol Succinate 25 Mg Tb24 (Metoprolol succinate) .... Take 1 tablet by mouth two times a day 8)  Zocor 40 Mg Tabs (Simvastatin) .... Take 1 tablet by mouth once a day 9)  Paxil 10 Mg Tabs (Paroxetine hcl) .... Take 1 tablet by mouth once a day 10)  Flexeril 10 Mg Tabs (Cyclobenzaprine hcl) .... Take 1 tablet by mouth once each evening as needed pain 11)  Os-cal 500 + D Tabs (Calcium carbonate-vitamin d tabs) .... Take 1 tablet by mouth three times a day 12)  Aspir-low 81 Mg Tbec (Aspirin) .... Take 1 tablet by mouth once a day 13)  Lantus Solostar 100 Unit/ml Soln (Insulin glargine) .Marland KitchenMarland KitchenMarland Kitchen 18-24 units injected every night. 14)  Bd U/f Iii Short Pen Needle 31g X 8 Mm Misc (Insulin pen needle) .... To take insulin at meals 3x/day and at bedtime 15)  Freestyle Lite Strp (Glucose blood) .... To test 5x/day before meals and bedtime 16)  Bd Ultra-fine 33 Lancets Misc (Lancets) .... To test 5x/day before meals and bedtime 17)  Novolog Flexpen 100 Unit/ml Soln (Insulin aspart) .... Inject before meals as instructed 7 or 8 units each meal 3x/day 18)  Oxybutynin Chloride 5 Mg Tabs (Oxybutynin chloride) .... Take one tablet two times a day 19)  Bactrim Ds 800-160 Mg Tabs (Sulfamethoxazole-trimethoprim) .... Take one tablet two times a day for 3 days   Patient Instructions: 1)  [name], it was nice to meet you today! 2)  You have a bladder infection,  and you have a prescription for bactrim for 3 days which should clear it up. 3)  Congratulations on your low A1C of 7.3!!  You are doing very well with your diabtes control!  Keep it up! 4)  Please schedule a follow-up appointment in 1 month and follow up on recent medical issues. 5)  Please return for lab work one (1) week before your next appointment.  6)  Take calcium +Vitamin D daily.    Prescriptions: BACTRIM DS 800-160 MG  TABS (SULFAMETHOXAZOLE-TRIMETHOPRIM) take one tablet two times a day  for 3 days  #6 x 0   Entered and Authorized by:   Myrtis Ser MD   Signed by:   Myrtis Ser MD on 11/17/2006   Method used:   Print then Give to Patient   RxID:   [id number] OXYBUTYNIN CHLORIDE 5 MG  TABS (OXYBUTYNIN CHLORIDE) Take one tablet two times a day  #32 x 3   Entered and Authorized by:   Myrtis Ser MD   Signed by:   Myrtis Ser MD on 11/17/2006   Method used:   Print then Give to Patient   RxID:   [id number]  ] Laboratory Results   Urine Tests  Date/Time Recieved: 11/17/06 at 3:06P.M. Date/Time Reported: 11/17/06 AT 3:06P.M.  Routine Urinalysis   Glucose: negative   (Normal Range: Negative) Bilirubin: negative   (Normal Range: Negative) Ketone: negative   (Normal Range: Negative) Spec. Gravity: 1.025   (Normal Range: 1.003-1.035) Blood: trace-intact   (Normal Range: Negative) pH: 6.0   (Normal Range: 5.0-8.0) Protein: >=300   (Normal Range: Negative) Urobilinogen: 0.2   (Normal Range: 0-1) Nitrite: negative   (Normal Range: Negative) Leukocyte Esterace: large   (Normal Range: Negative)     Blood Tests   Date/Time Recieved: November 17, 2006 2:06 PM  Date/Time Reported: ..................................................................Marland KitchenMelvia Heaps  November 17, 2006 2:06 PM  HGBA1C: 7.3%   (Normal Range: Non-Diabetic - 3-6%   Control Diabetic - 6-8%) CBG Random: 184

## 2010-03-26 NOTE — Progress Notes (Signed)
Summary: Refill/gh  Phone Note Refill Request Message from:  Fax from Pharmacy on March 23, 2009 3:38 PM  Refills Requested: Medication #1:  PAXIL 20 MG  TABS Take one tablet once daily.   Last Refilled: 01/26/2009  Medication #2:  NORVASC 10 MG TABS Take 1 tablet by mouth once a day   Last Refilled: 01/26/2009  Medication #3:  ZOCOR 40 MG TABS Take 1 tablet by mouth once a day   Last Refilled: 01/26/2009  Method Requested: Electronic Initial call taken by: Sander Nephew RN,  March 23, 2009 3:38 PM    Prescriptions: ZOCOR 40 MG TABS (SIMVASTATIN) Take 1 tablet by mouth once a day  #32 x 6   Entered and Authorized by:   Myrtis Ser MD   Signed by:   Myrtis Ser MD on 03/23/2009   Method used:   [facility]        [facility] [address] [phone]* (retail)       [address]       [place], [place]  [address]       Ph: [phone] or [phone]       Fax: [phone]   RxID:   [id number] PAXIL 20 MG  TABS (PAROXETINE HCL) Take one tablet once daily.  #32 x 6   Entered and Authorized by:   Myrtis Ser MD   Signed by:   Myrtis Ser MD on 03/23/2009   Method used:   [facility] to        [facility] [address] [phone]* (retail)       [address]       [place], [place]  [address]       Ph: [phone] or [phone]       Fax: [phone]   RxID:   [id number] [place] 10 MG TABS (AMLODIPINE BESYLATE) Take 1 tablet by mouth once a day  #32 x 6   Entered and Authorized by:   Myrtis Ser MD   Signed by:   Myrtis Ser MD on 03/23/2009   Method used:   Electronically to        [facility] [address] [phone]* (retail)       [address]       [place], [place]  [address]       Ph: [phone] or [phone]       Fax: [phone]   RxID:   [id number]

## 2010-03-26 NOTE — Progress Notes (Signed)
Summary: refill/ hla  Phone Note Refill Request Message from:  Fax from Pharmacy on January 07, 2010 4:48 PM  Refills Requested: Medication #1:  NOVOLOG FLEXPEN 100 UNIT/ML  SOLN inject before meals 8 units before breakfast & lunch and 7 units before dinner. ADD 1 unit for each 50 mg/dl > 150 to base amount of insulin.   Dosage confirmed as above?Dosage Confirmed   Last Refilled: 10/6 last visit 9/14, last labs 9/20  Initial call taken by: Freddy Finner RN,  January 07, 2010 4:48 PM  Follow-up for Phone Call        Last seen 9/11. Asked to F/U 1 week. No appt in EMR. I sent a flag to Ms Cyndi Bender to schedule an appt.  Follow-up by: Larey Dresser MD,  January 07, 2010 5:46 PM    New/Updated Medications: NOVOLOG FLEXPEN 100 UNIT/ML  SOLN (INSULIN ASPART) inject before meals 8 units before breakfast & lunch and 7 units before dinner. Prescriptions: NOVOLOG FLEXPEN 100 UNIT/ML  SOLN (INSULIN ASPART) inject before meals 8 units before breakfast & lunch and 7 units before dinner.  #2 x 2   Entered and Authorized by:   Larey Dresser MD   Signed by:   Larey Dresser MD on 01/07/2010   Method used:   [facility]ically to        [facility] [address] [phone]* (retail)       [address]       [place], [place]  [address]       Ph: [phone] or [phone]       Fax: [phone]   RxID:   [id number]   Appended Document: refill/ hla ph # called not in service

## 2010-03-26 NOTE — Progress Notes (Signed)
Summary: refill/gg  Phone Note Refill Request  on December 27, 2008 3:30 PM  Refills Requested: Medication #1:  METFORMIN HCL 1000 MG TABS Take 1 tablet by mouth two times a day  Method Requested: Electronic Initial call taken by: Gevena Cotton RN,  December 27, 2008 3:30 PM    Prescriptions: METFORMIN HCL 1000 MG TABS (METFORMIN HCL) Take 1 tablet by mouth two times a day  #64 Tablet x 6   Entered and Authorized by:   Myrtis Ser MD   Signed by:   Myrtis Ser MD on 12/27/2008   Method used:   Electronically to        [facility] [address] [phone]* (retail)       [address]       [place], [place]  [address]       Ph: [phone] or [phone]       Fax: [phone]   RxID:   [id number]

## 2010-03-26 NOTE — Progress Notes (Signed)
Summary: [name] Rx/dmr  Phone Note Outgoing Call   Call placed by: [name],  August 20, 2006 12:36 PM Call placed to: pharmacy Summary of Call: needs Novolog pen RX  Follow-up for Phone Call        Phone call completed Follow-up by: [name],  August 26, 2006 9:44 AM    Prescriptions: NOVOLOG FLEXPEN 100 UNIT/ML  SOLN (INSULIN ASPART) inject before meals as instructed 7 or 8 units each meal 3x/day  #1 box x 6   Entered and Authorized by:   Izora Gala Phifer MD   Signed by:   Izora Gala Phifer MD on 08/25/2006   Method used:   Telephoned to ...       [facility] [address]       [address]       [place], [place]  [address]       Ph: [phone]       Fax: [phone]   RxID:   [id number]

## 2010-03-26 NOTE — Progress Notes (Signed)
Summary: Refill/gh  Phone Note Refill Request Message from:  Fax from Pharmacy on September 28, 2009 2:48 PM  Refills Requested: Medication #1:  LISINOPRIL 40 MG  TABS Take one tablet daily.   Last Refilled: 05/25/2009  Method Requested: Electronic Initial call taken by: Sander Nephew RN,  September 28, 2009 2:48 PM  Follow-up for Phone Call         Last seen 6/10 and no showed next two visit. Will refill one month but she must make an appt to be seen. I sent flag to Ms boone earlier this week. Follow-up by: Larey Dresser MD,  September 28, 2009 3:50 PM    New/Updated Medications: LISINOPRIL 40 MG  TABS (LISINOPRIL) Take one tablet daily. You need to call the clinic at [phone] for an appoitment ASAP to receive additional medications. Prescriptions: LISINOPRIL 40 MG  TABS (LISINOPRIL) Take one tablet daily. You need to call the clinic at [phone] for an appoitment ASAP to receive additional medications.  #31 x 0   Entered and Authorized by:   Larey Dresser MD   Signed by:   Larey Dresser MD on 09/28/2009   Method used:   Electronically to        [facility] [address] [phone]* (retail)       [address]       [place], [place]  [address]       Ph: [phone] or [phone]       Fax: [phone]   RxID:   [id number]

## 2010-03-28 NOTE — Assessment & Plan Note (Signed)
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 creatinine: 0.97  (11/13/2009)   Serum potassium 4.3  (11/13/2009)  Self-Management Support :   Personal Goals (by the next clinic visit) :     Personal A1C goal:   7  (03/05/2010)     Personal blood pressure goal: 130/80  (03/05/2010)     Personal LDL goal: 6  (03/05/2010)    Patient will work on the following items until the next clinic visit to reach self-care goals:     Medications and monitoring: take my medicines every day, bring all of my medications to every visit, examine my feet every day  (03/05/2010)     Eating: drink diet soda or water instead of juice or soda, eat  more vegetables, use fresh or  frozen vegetables, eat foods that are low in salt, eat baked foods instead of fried foods, eat fruit for snacks and desserts, limit or avoid alcohol  (03/05/2010)    Diabetes self-management support: Resources for patients handout  (03/05/2010)   Last diabetes self-management training by diabetes educator: 12/18/2009    Hypertension self-management support: Resources for patients handout  (03/05/2010)    Lipid self-management support: Resources for patients handout  (03/05/2010)     Self-management comments: PATIENT USES A Actor.   Nursing Instructions: Give Pneumovax today Schedule screening mammogram (see order) Diabetic foot exam today    Laboratory Results   Urine Tests  Date/Time Received: 1-10-012         10:53AM Date/Time Reported: 1-10-012         11:00AM  Routine Urinalysis   Color: straw Appearance: Hazy Glucose: negative   (Normal Range: Negative) Bilirubin: negative   (Normal Range: Negative) Ketone: negative   (Normal Range: Negative) Spec. Gravity: 1.025   (Normal Range: 1.003-1.035) Blood: trace-lysed   (Normal Range: Negative) pH: 6.0   (Normal Range: 5.0-8.0) Protein: >=300   (Normal Range: Negative) Urobilinogen: 0.2   (Normal Range: 0-1) Nitrite: negative   (Normal Range: Negative) Leukocyte Esterace: small   (Normal Range: Negative)     Blood Tests   Date/Time Received: March 05, 2010 9:19 AM Date/Time Reported: Maryan Rued  March 05, 2010 9:19 AM   HGBA1C: 8.1%   (Normal Range: Non-Diabetic - 3-6%   Control Diabetic - 6-8%) CBG Fasting:: $RemoveBefor'199mg'mWrIscwSKizt$ /dL       Last LDL:                                                 57 (11/13/2009 5:51:00 PM)          Diabetic Foot Exam Last Podiatry Exam Date: 07/01/2006 Pulse Check          Right Foot          Left Foot Posterior Tibial:        diminished            diminished Dorsalis Pedis:        diminished             diminished    10-g (5.07) Semmes-Weinstein Monofilament Test Performed by: Lucky Rathke NT II          Right Foot          Left Foot Visual Inspection     normal           normal

## 2010-03-28 NOTE — Miscellaneous (Signed)
Summary: Orders Update  Clinical Lists Changes  Orders: Added new Referral order of Social Work Referral (Social ) - Signed 

## 2010-03-28 NOTE — Miscellaneous (Signed)
  Clinical Lists Changes  Problems: Removed problem of ACUTE SINUSITIS, UNSPECIFIED (ICD-461.9) Removed problem of LEG CRAMPS (ICD-729.82) Removed problem of EDEMA (ICD-782.3) Removed problem of SYMPTOM, INCONTINENCE, MIXED, URGE/STRESS (ICD-788.33) Removed problem of CAROTID ARTERY STENOSIS, RIGHT (ICD-433.10) - 60-79% per E Chart dictation Needs CVTS referral Removed problem of BREAST PAIN, RIGHT (ICD-611.71) Removed problem of LOW BACK PAIN (ICD-724.2) Added new problem of OSTEOPOROSIS (ICD-733.00) - DEXA 10/06 L femur T -2.8 Observations: Added new observation of PAST MED HX: Coronary artery disease     Cath 5/06 50% stenosis of RCA     s/p CABG (06/2004) w/ Saphenous vein to RCA     Statin, BB, ASA     Severe Aortic Stenosis     s/p Aortic valve replacement w/ porcine valve (06/2004)  CVA ( R periventricular) 5/06     post-op from AV[facility], presumed embolic in nature     Carotid stenosis, R  60-79%      Repeat Dopplers 4/10, no R stenosis, L 1-39% stenosis    Breast Adenocarcinoma, Left     Dx 1996 s/p tamoxifen and mastecomy  Depression  Diabetes mellitus, type II     Insulin dependent  Hyperlipidemia     Mgmt with a statin  Hypertension     4 drug therapy  Jehova's Witness  Osteoperosis     DEXA 10/06 : L femur T -2.8, R -2.7. Lumbar T -2.4     On bisphosphonates and Ca / Vit D  s/p TAH 1980's  Left sided diverticulosis; colonoscopy (06/1999)    Normal EGD (06/1999)       (03/04/2010 18:52)      Past History:  Past Medical History: Coronary artery disease     Cath 5/06 50% stenosis of RCA     s/p CABG (06/2004) w/ Saphenous vein to RCA     Statin, BB, ASA     Severe Aortic Stenosis     s/p Aortic valve replacement w/ porcine valve (06/2004)  CVA ( R periventricular) 5/06     post-op from AVR, presumed embolic in nature     Carotid stenosis, R  60-79%      Repeat Dopplers 4/10, no R stenosis, L 1-39% stenosis    Breast Adenocarcinoma, Left     Dx  1996 s/p tamoxifen and mastecomy  Depression  Diabetes mellitus, type II     Insulin dependent  Hyperlipidemia     Mgmt with a statin  Hypertension     4 drug therapy  Jehova's Witness  Osteoperosis     DEXA 10/06 : L femur T -2.8, R -2.7. Lumbar T -2.4     On bisphosphonates and Ca / Vit D  s/p TAH 1980's  Left sided diverticulosis; colonoscopy (06/1999)    Normal EGD (06/1999)      Appended Document:     Clinical Lists Changes  Observations: Added new observation of BONE DENSITY: osteoporosis hips B (12/13/2004 19:12) Added new observation of COLONOSCOPY: L diverticulosis (07/05/1999 19:12)

## 2010-03-28 NOTE — Assessment & Plan Note (Signed)
Summary: Home Health/Equipment  30 minutes.   Shower seat provided by [facility]. Referred by Dr. Lynnae January and Drema Balzarine.  Dx  s/p CVA.    Medicare A & B,  Called Darrien from [facility] who needs order and registration sheet.  Met with patient and daughter and explained that [facility] would be coming down to supply chair and they were receptive to that option, however, pt. unable to afford any copayments.  Advised them that [facility] would work with them in that regard.

## 2010-03-28 NOTE — Letter (Signed)
Summary: Pre Visit Letter Revised  [facility]  [place], [place] [address]   Phone: [phone]  Fax: [phone]        03/05/2010 MRN: [id number]  [facility] [place], [place]  [address]             Procedure Date:  04-16-10 at 8:30am            Dr Deatra Ina - Direct Colon  Welcome to the Gastroenterology Division at [facility].    You are scheduled to see a nurse for your pre-procedure visit on 04-02-10 at 04-16-10 on the 3rd floor at [facility], [place] [employer].  We ask that you try to arrive at our office 15 minutes prior to your appointment time to allow for check-in.  Please take a minute to review the attached form.  If you answer "Yes" to one or more of the questions on the first page, we ask that you call the person listed at your earliest opportunity.  If you answer "No" to all of the questions, please complete the rest of the form and bring it to your appointment.    Your nurse visit will consist of discussing your medical and surgical history, your immediate family medical history, and your medications.   If you are unable to list all of your medications on the form, please bring the medication bottles to your appointment and we will list them.  We will need to be aware of both prescribed and over the counter drugs.  We will need to know exact dosage information as well.    Please be prepared to read and sign documents such as consent forms, a financial agreement, and acknowledgement forms.  If necessary, and with your consent, a friend or relative is welcome to sit-in on the nurse visit with you.  Please bring your insurance card so that we may make a copy of it.  If your insurance requires a referral to see a specialist, please bring your referral form from your primary care physician.  No co-pay is required for this nurse visit.     If you cannot keep your appointment, please call [phone] to cancel or  reschedule prior to your appointment date.  This allows [place] the opportunity to schedule an appointment for another patient in need of care.    Thank you for choosing [facility] for your medical needs.  We appreciate the opportunity to care for you.  Please visit [place] at our website  to learn more about our practice.  Sincerely, The Gastroenterology Division

## 2010-04-10 ENCOUNTER — Encounter: Payer: Self-pay | Admitting: Internal Medicine

## 2010-04-10 DIAGNOSIS — Z531 Procedure and treatment not carried out because of patient's decision for reasons of belief and group pressure: Secondary | ICD-10-CM | POA: Insufficient documentation

## 2010-04-10 DIAGNOSIS — Z853 Personal history of malignant neoplasm of breast: Secondary | ICD-10-CM | POA: Insufficient documentation

## 2010-04-10 DIAGNOSIS — I350 Nonrheumatic aortic (valve) stenosis: Secondary | ICD-10-CM

## 2010-04-10 DIAGNOSIS — E119 Type 2 diabetes mellitus without complications: Secondary | ICD-10-CM

## 2010-04-10 DIAGNOSIS — I2510 Atherosclerotic heart disease of native coronary artery without angina pectoris: Secondary | ICD-10-CM | POA: Insufficient documentation

## 2010-04-10 DIAGNOSIS — I10 Essential (primary) hypertension: Secondary | ICD-10-CM | POA: Insufficient documentation

## 2010-04-10 DIAGNOSIS — IMO0001 Reserved for inherently not codable concepts without codable children: Secondary | ICD-10-CM | POA: Insufficient documentation

## 2010-04-10 DIAGNOSIS — M810 Age-related osteoporosis without current pathological fracture: Secondary | ICD-10-CM | POA: Insufficient documentation

## 2010-04-10 DIAGNOSIS — Z8673 Personal history of transient ischemic attack (TIA), and cerebral infarction without residual deficits: Secondary | ICD-10-CM | POA: Insufficient documentation

## 2010-04-10 DIAGNOSIS — E785 Hyperlipidemia, unspecified: Secondary | ICD-10-CM | POA: Insufficient documentation

## 2010-04-11 ENCOUNTER — Encounter: Payer: Self-pay | Admitting: *Deleted

## 2010-04-12 ENCOUNTER — Encounter: Payer: Self-pay | Admitting: Internal Medicine

## 2010-04-16 ENCOUNTER — Other Ambulatory Visit: Payer: Self-pay | Admitting: Gastroenterology

## 2010-05-09 LAB — GLUCOSE, CAPILLARY: Glucose-Capillary: 129 mg/dL — ABNORMAL HIGH (ref 70–99)

## 2010-06-03 LAB — GLUCOSE, CAPILLARY: Glucose-Capillary: 281 mg/dL — ABNORMAL HIGH (ref 70–99)

## 2010-06-05 LAB — GLUCOSE, CAPILLARY: Glucose-Capillary: 164 mg/dL — ABNORMAL HIGH (ref 70–99)

## 2010-06-06 LAB — GLUCOSE, CAPILLARY: Glucose-Capillary: 215 mg/dL — ABNORMAL HIGH (ref 70–99)

## 2010-06-08 ENCOUNTER — Other Ambulatory Visit: Payer: Self-pay | Admitting: Internal Medicine

## 2010-06-10 ENCOUNTER — Encounter: Payer: Self-pay | Admitting: Internal Medicine

## 2010-06-10 DIAGNOSIS — Z7189 Other specified counseling: Secondary | ICD-10-CM | POA: Insufficient documentation

## 2010-06-10 DIAGNOSIS — Z0000 Encounter for general adult medical examination without abnormal findings: Secondary | ICD-10-CM | POA: Insufficient documentation

## 2010-06-25 ENCOUNTER — Encounter: Payer: Self-pay | Admitting: Internal Medicine

## 2010-07-09 NOTE — Procedures (Signed)
CAROTID DUPLEX EXAM   INDICATION:  Two year followup exam.   HISTORY:  Diabetes:  No.  Cardiac:  Valve replacement.  Hypertension:  Yes.  Smoking:  No.  Previous Surgery:  Left-sided mastectomy.  CV History:  History of left side CVA, the patient states she has  occasional dizziness.  Amaurosis Fugax No, Paresthesias No, Hemiparesis No                                       RIGHT             LEFT  Brachial systolic pressure:         Not done          Not done  Brachial Doppler waveforms:         Normal            Normal  Vertebral direction of flow:        Antegrade         Antegrade  DUPLEX VELOCITIES (cm/sec)  CCA peak systolic                   95                86  ECA peak systolic                   59                65  ICA peak systolic                   66                71  ICA end diastolic                   20                17  PLAQUE MORPHOLOGY:                  Mixed             Mixed  PLAQUE AMOUNT:                      Mild              Minimal  PLAQUE LOCATION:                    Distal CCA/bifurcation              Proximal ICA   IMPRESSION:  1. No evidence of stenosis noted in the right internal carotid artery.  2. 1-39% stenosis of the left internal carotid artery.  3. No significant change in Doppler velocities when compared to the      previous exam on 05/21/2006.   ___________________________________________  P. Drucie Opitz, M.D.   CH/MEDQ  D:  06/22/2008  T:  06/22/2008  Job:  [id number]

## 2010-07-09 NOTE — Assessment & Plan Note (Signed)
OFFICE VISIT   [name], [name]  DOB:  06/06/1945                                       06/22/2008  CVELF#:[id number]   The patient is a 65 year old female who underwent aortic valve  replacement and coronary artery bypass in July of 2006.  This was  complicated by a right periventricular CVA.  At that time she underwent  carotid Dopplers which revealed a 60-79% right ICA stenosis.  She was  treated with conservative medical therapy and follows up at this time  for reevaluation of cerebrovascular disease.   A carotid Doppler was carried out today.  This reveals no evidence of  right ICA stenosis.  Left ICA reveals minimal disease.  Mild to minimal  plaque noted at the carotid bifurcation bilaterally.   The patient has no history of recurrent stroke.  She is on aspirin 81 mg  daily.  Did not bring her other medications with her at this time.   She walks with the assistance of a walker.  Has a history of breast CA  with left mastectomy.  Complains of occasional dizziness.   PHYSICAL EXAMINATION:  General:  Reveals a 65 year old [place] American  female, alert and oriented.  No acute distress.  Vital signs:  BP is  152/78, pulse is 64 per minute.  Neck:  Her neck reveals no bruits.  Heart:  Sounds are normal with a crisp aortic valve sound.  A 2/6  systolic ejection murmur present.  No gallops or rubs.  No carotid  bruits.  Extremities:  Her upper extremities reveal equal strength.  Mild left lower extremity reduction in strength.   The patient was reassured today that there was no evidence of  significant carotid occlusive disease.  Recommend no further followup at  this time.  Continue aspirin and medical regimen for stroke.   Dorothea Glassman, M.D.  Electronically Signed   PGH/MEDQ  D:  06/22/2008  T:  06/23/2008  Job:  [id number]   cc:   Gilford Raid, M.D.  [place] Bridge, MD

## 2010-07-12 NOTE — Discharge Summary (Signed)
[name], [name]              ACCOUNT NO.:  [id number]   MEDICAL RECORD NO.:  [id number]          PATIENT TYPE:  INP   LOCATION:  3738                         FACILITY:  [facility]   PHYSICIAN:  Jacquelynn Cree, M.D.   DATE OF BIRTH:  [birth year]   DATE OF ADMISSION:  06/18/2004  DATE OF DISCHARGE:  06/26/2004                                 DISCHARGE SUMMARY   ADDENDUM:   RESIDENT:  Elmarie Shiley, M.D.   DISCHARGE MEDICATIONS:  1.  Glucophage 500 mg b.i.d.  2.  Glucotrol 10 mg daily.  She will be sent out on these medications.      SD/MEDQ  D:  06/26/2004  T:  06/26/2004  Job:  [id number]

## 2010-07-12 NOTE — Op Note (Signed)
[name], [name]              ACCOUNT NO.:  [id number]   MEDICAL RECORD NO.:  [id number]          PATIENT TYPE:  INP   LOCATION:  2316                         FACILITY:  [facility]   PHYSICIAN:  Gilford Raid, M.D.     DATE OF BIRTH:  Sep 01, 1945   DATE OF PROCEDURE:  07/05/2004  DATE OF DISCHARGE:                                 OPERATIVE REPORT   PREOPERATIVE AND POSTOPERATIVE DIAGNOSES:  1.  Critical aortic stenosis.  2.  Single-vessel coronary artery disease.   PROCEDURE:  Median sternotomy, extracorporeal circulation, coronary bypass  graft surgery times one using a saphenous vein graft to the right coronary  artery, and aortic valve replacement using a 21 mm Magna pericardial valve.  Endoscopic vein harvest from the right leg.   ATTENDING SURGEON:  Gilford Raid, M.D.   ASSISTANT:  Valentina Gu. Roxy Manns, M.D.   SECOND ASSISTANT:  Darlin Coco, [place]   ANESTHESIA:  General endotracheal.   CLINICAL HISTORY:  This is a 65 year old woman who is a Jehovah's Witness,  who was admitted to [facility] on June 18, 2004 with a one-month history of  left arm tightness occurring with rest and relieved spontaneously.  Over the  two weeks prior to admission she began having substernal chest tightness as  well as diaphoresis and shortness of breath. An echocardiogram showed normal  left ventricular ejection fraction of 65-75% with critical aortic stenosis  with a mean gradient of 78 mmHg and a valve area of 0.68 cm2. There is  moderate to marked mitral annular calcification with question of mild mitral  stenosis. She underwent cardiac catheterization on June 14, 2004 which  showed 50% mid right coronary stenosis. The LAD and left circumflex had no  significant stenoses.  There were normal right and left heart filling  pressures. She was evaluated for surgery. Dental consultation was obtained  and she had several teeth extracted by Dr. Enrique Sack on Jun 24, 2004.  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 She was discharged by medical service and returned to see me in the  office on Jul 02, 2004. At that point her hemoglobin was 12.7. She was doing  quite well. We discussed the operative procedure of aortic valve replacement  and coronary bypass surgery. We discussed alternatives to surgery of which  there were none, benefits and risks of surgery including bleeding, which  could be life-threatening since she was a Jehovah's Witness and refused all  blood transfusion. We discussed the risk of infection, stroke, myocardial  infarction, organ failure, and death. She understood and agreed to proceed  with surgery. We also discussed the alternatives for valve replacement  including mechanical and tissue valves. I discussed the pros and cons of  both. Since she was Jehovah's Witness and refused all forms of blood  transfusion, I felt that a mechanical valve would be  riskier since she would  have to be on Coumadin life long. I was concerned that her life long risk of  a significant bleeding episode while on Coumadin would be higher than the  risk of her needing a redo aortic valve replacement for tissue valve  deterioration over time. I discussed this with her and she was in agreement  to proceed with surgery using a tissue valve.   OPERATIVE PROCEDURE:  The patient was taken to the operating room, placed on  table in supine position. After induction of general endotracheal  anesthesia, a Foley catheter was placed and bladder using sterile technique.  The chest, abdomen and both lower extremities were prepped and draped in  usual sterile manner. Aprotinin was used for this case since she was a  Sales promotion account executive Witness at higher risk for complications due to bleeding and  anemia.    Transesophageal echocardiogram was performed by anesthesiology and showed  critical calcific aortic stenosis.. There appeared to be a bicuspid valve  with markedly calcified leaflets. The valve area was 0.6 to 0.7 cm2. Left  ventricular function appeared well-preserved with severe concentric left  ventricular hypertrophy. There was no significant mitral regurgitation or  mitral stenosis. The right ventricular function appeared well-preserved.   Then the chest opened through a median sternotomy incision and the  pericardium opened in midline. Examination of the heart showed good  ventricular contractility. The ascending aorta was of normal size and  quality and had no palpable plaques in it. At the same time, a segment of  greater saphenous vein was harvested from the right leg. This vein was  harvested endoscopically and was of medium caliber and quality.   Then the patient was heparinized. When an adequate activated clotting time  was achieved, the distal ascending aorta was cannulated using a 20-French  aortic cannula for arterial inflow. Venous outflow was achieved using a two-  stage venous cannula for the right atrial appendage. An antegrade  cardioplegia and vent cannula was inserted in the aortic root.   The patient placed on cardiopulmonary bypass and distal coronaries  identified. The right coronary was heavily diseased proximally but in its  midportion was free of disease.   Then a left ventricular vent was placed through the right superior pulmonary  vein and a retrograde cardioplegic cannula was inserted through the right  atrium and coronary sinus.   The aorta was then cross-clamped and 500 cc of cold blood antegrade  cardioplegia was administered and aortic root with quick arrest the heart.  Systemic hypothermia to 20 degrees centigrade and topical hypothermia with  iced saline was used. Temperature probes placed in the septum and insulating pad in the pericardium.  Additional doses of retrograde  cardioplegia were given at about 20 minute intervals to maintain myocardial  temperature around 10 degrees centigrade.   The first distal anastomosis was then performed to the midportion of the  right coronary artery. The internal diameter of about 2.5 mm. Conduit used  was segment of greater saphenous vein and anastomosis performed end-to-side  manner using continuous 7-0 Prolene suture.  Flow measured through the graft  and was excellent.   Then attention was turned aortic valve placement. The aorta was opened  transversely at the level of the sinotubular junction. Examination of the  valve showed that there were three leaflets with fusion of the commissure  between the right and noncoronary cusps. The coronary ostia were in their  normal location and were not obstructed. The native valve  was markedly  calcified and immobile. There was mild annular calcification. The native  valve was excised taking care to remove all particulate debris. The aortic  root and left ventricle were irrigated with iced saline solution. The  annulus was sized and a 21 mm Baxter Edwards Magna pericardial valve was  chosen.  This had model number 3000, serial number [id number]. Then a series of  pledgeted 2-0 Ethibond horizontal mattress sutures were placed around the  annulus with pledgets in a subannular position. Sutures were placed through  the valve ring and the valve lowered into place.  Sutures were tied  sequentially. The valve seated nicely. The coronary ostia were not  obstructed. There was no evidence of left ventricular outflow tract  obstruction. Then the patient was rewarmed to 37 degrees centigrade. Aorta  was closed in two layers using continuous 4-0 Prolene suture. Then with the  crossclamp in place, the single proximal vein graft anastomosis was formed  to the aortic root in end-to-side manner using continuous 6-0 Prolene  suture.  Then the left side of the heart  was carefully de-aired. The head  was placed in Trendelenburg position and the cross clamp removed with a time  of 78 minutes. There  was spontaneous return of sinus rhythm. The proximal  and distal anastomosis appeared hemostatic and line of the graft  satisfactory. The aortotomy was hemostatic. Two temporary right ventricular  and right atrial pacing wires placed and brought out through the skin. A  graft marker was placed around proximal anastomosis. When the patient had  rewarmed to 37 degrees centigrade, she was weaned from cardiopulmonary  bypass on no inotropic agents.  Total bypass time was 110 minutes. Cardiac  function appeared excellent with cardiac output of 5 L per minute.  Transesophageal echocardiogram was performed and showed normal left  ventricular function. The aortic valve prosthesis was functioning normally  without insufficiency or perivalvular leak. There was no significant mitral regurgitation. Right ventricular function appeared well-preserved. Then  protamine was given and the venous and aortic cannulas were removed without  difficulty. Hemostasis was achieved. Two chest tubes were placed with a tube  in the post pericardium and one the anterior mediastinum. The pericardium  was loosely reapproximated over the heart. Sternum was closed #6 stainless  steel wires. The fascia was closed with continuous #1 Vicryl suture.  Subcutaneous tissue was closed with continuous 2-0 Vicryl and the skin with  3-0 Vicryl subcuticular closure. The lower extremity vein harvest site was  closed in layers in a similar manner. The sponge, needle and instrument  counts correct according to scrub nurse. Dry sterile dressings were applied  over the incisions and around the chest tubes which were hooked to Pleur-  Evac suction. The patient remained hemodynamically stable and was  transferred to the SICU in guarded but stable condition.       BB/MEDQ  D:  07/08/2004  T:  07/08/2004   Job:  [id number]   cc:   [facility] Cardiology office   Cardiac cath lab

## 2010-07-12 NOTE — Discharge Summary (Signed)
NAMERATASHA, FABRE              ACCOUNT NO.:  [id number]   MEDICAL RECORD NO.:  [id number]          PATIENT TYPE:  INP   LOCATION:  1287                         FACILITY:  [facility]   PHYSICIAN:  Gilford Raid, M.D.     DATE OF BIRTH:  [birth year]   DATE OF ADMISSION:  07/05/2004  DATE OF DISCHARGE:  07/17/2004                                 DISCHARGE SUMMARY   PRIMARY DIAGNOSES:  1.  Coronary artery disease.  2.  Aortic stenosis.   IN HOSPITAL DIAGNOSES:  1.  Postoperative pneumothorax.  2.  Acute respiratory failure.  3.  Acute renal insufficiency.  4.  Acute blood loss anemia.  5.  Postoperative colonic ileus.  6.  Postoperative right cerebrovascular accident.   SECONDARY DIAGNOSIS:  1.  Uncontrolled hypertension.  2.  Diabetes mellitus type 2.  3.  Hypercholesterolemia.  4.  Status post hysterectomy.   ALLERGIES:  NO KNOWN DRUG ALLERGIES.   IN HOSPITAL OPERATIONS PROCEDURES:  1.  Coronary artery bypass grafting x1 with a saphenous vein graft to the      right coronary artery.  2.  Aortic valve replacement with a 21 mm Magna pericardial tissue valve.   HISTORY AND PHYSICAL AND HOSPITAL COURSE:  Ms. Kouns is a 65 year old  African-American female who is a Jehovah's Witness who was admitted to [facility] on June 18, 2004, with an approximately 1 month history of left  sternum tightness occurring with rest and lasting up to 1 hour before  spontaneous relief.  Over the 2 weeks prior to admission, she began  experiencing 6/10 substernal chest tightness associated with diaphoresis and  shortness of breath.  She had an echocardiogram performed which showed  overall normal left ventricular ejection fraction 65-75%.  There was severe  aortic stenosis with a mean transaortic valve gradient 78 and an aortic  valve area of 0.68 cm2.  There was moderate to marked mitral annular  calcification with mild mitral stenosis.  She underwent cardiac  catheterization on June 14, 2004,  which showed a 50% mid right coronary  stenosis.  The LAD and left circumflex had no significant disease.  She had  normal right and left heart filling pressures.  She was evaluated for  surgery and underwent dental consultation with front teeth extractions by  Dr. Enrique Sack on Jun 24, 2004.  She had four teeth removed.  She remained  asymptomatic while in the hospital and was eventually discharged on Jun 26, 2004, by the medical service.  Patient returned to the [facility] office on  Jul 02, 2004, to discuss undergoing coronary artery bypass grafting as well  as aortic valve replacement.  Dr. Cyndia Bent discussed the risks and benefits of  this procedure with patient.  She acknowledged her understanding.  Patient  wished to proceed.  As mentioned above, patient is a Sales promotion account executive Witness and  refused any blood transfusions.  For details of the patient's past medical  history and physical exam, please see dictated history and physical.   HOSPITAL COURSE:  Ms. Hird was taken to the operating room on Jul 05, 2004, where  she underwent coronary artery bypass grafting x1 with saphenous  vein graft to the right coronary artery.  She also underwent aortic valve  replacement with a 21 mm Magna pericardial tissue valve.  Patient tolerated  this procedure well and transferred up to the intensive care unit in stable  condition.  Immediately following surgery, patient seemed to be  hemodynamically stable.  She was extubated later evening, early following of  surgery.  Postoperative day 1, patient was seen to be hemodynamically  stable.  Hemoglobin and hematocrit were 9.6 and 28.  Creatinine was stable  at 0.9.  Neuro was intake.  Chest x-ray was stable.  Chest tubes were  discontinued as well as Swan and Foley.  Patient was transferred down to  2000.  Prior to being transferred out of the unit, patient had complaints of  leg weakness.  She also desated.  She desatted into the 70s on room air.  She remained in  normal sinus rhythm.  The patient's neuro was intact and she  was moving all 4 extremities, following commands.  Noted weakness on the  left side.  Noticed also on physical exam there was decreased breath sounds  on the right side of the chest.  STAT chest x-ray was ordered.  This  revealed a 30% right pneumothorax.  It was discussed with patient placing a  chest tube in for this pneumothorax.  A 28 French chest tube was placed in  the pleura of the right chest.  Patient tolerated this procedure well and  was stable following chest tube insertion.  Postoperative day 2, patient  still had left-sided weakness.  She also had left-sided __________ lethargy.  Patient opened eyes on commands but did not follow any other commands.  CT  scan of the head was done which showed no acute infarct at that time.  Chest  tube had minimal drainage.  Patient remained stable.  Code Stroke was called  on Jul 07, 2004.  After evaluation, patient had no focal deficit at that  point.  Questionable hypotensive encephalopathy.  On day 2, patient unable  to protect airway effectively.  She was reintubated on Jul 07, 2004.  A STAT  echocardiogram was ordered on Jul 07, 2004, and was done by Dr. Ron Parker at the  bedside.  Noted no pericardial effusion on echocardiogram.  There was good  LV and RV function.  The aortic prosthesis seemed stable.  There was heavy  mitral annular calcification.  At that time, Dr. Aggie Moats talked with Dr.  Roxan Hockey and agreed with fluids.  Dr. Aggie Moats followed patient while in  hospital.  Pulmonary was also consulted on Jul 07, 2004.  Pulmonary  management by __________.  On Jul 08, 2004, patient remained intubated.  H&H  had dropped some to 7.7 and 19.8.  Creatinine was stable at 1.0.  A KUB was  obtained and showed colonic ileus which at that time was improving.  Patient  did develop afebrile leukocytosis and all peripheral IVs were changed out. This was monitored.  On Jul 08, 2004, another  head CT was done showing a  small hypodense area in the right brain of unclear significance.  Postoperative day 4, patient was seen to be afebrile with white count of  8.3.  The H&H had dropped some more to 6.8 and 20.7.  Creatinine remained  stable at 1.0.  Chest x-ray was stable with a small pleural effusion and  atelectasis.  Left upper and lower extremity remained weak.  Colonic  ileus  was improving.  CBCs were stable.  Postoperative day 5, patient was re-  extubated.  She remained stable.  Chest x-ray was within normal limits.  She  was in normal sinus rhythm.  She was awake and alert and asking for food.  Speech therapy evaluation was consulted.  This was done on Jul 10, 2004.  This showed no over signs or symptoms of aspiration were present.  Patient  did have decreased chew with crackers noted.  They recommended mechanical  soft diet with chopped meats and thin liquefied.  Patient was also seen on  Jul 06, 2004, by the stroke M.D.  Started patient on aspirin 325 mg daily.  Suggested that patient may need rehab inpatient.  Jul 11, 2004, patient's  ileus had pretty much completely resolved.  She was taking diet without  difficulty.  Incisions were all dry and intact and healing well.  She was  alert and oriented x3.  Patient was transferred to 3300 on Jul 11, 2004.  Jul 12, 2004, patient was progressing slowly.  CBGs were stable.  Incision  drainage had been healing well.  She remained in normal sinus rhythm.  Patient was continued on PT and OT therapy.  Jul 13, 2004, the patient was  slightly lethargic and drowsy.  To continue to work with PT and OT.  Incisions dry and intact.  Patient was slowly progressing.  Jul 16, 2004,  patient was feeling better.  She was tolerating diet well, no nausea or  vomiting.  No aspiration.  She did have diarrhea and this was sent for toxin  for C. difficile.  This ended up coming back negative.  Physical therapy and  occupational therapy continued to  work with patient.  They did suggest that  patient do an inpatient rehab.  She remained in normal sinus rhythm with  incisions healing well.  DC planning was started.  Case Management evaluated  on Jul 16, 2004, and due to patient being self-pay a Education officer, museum was  consulted.  Rehab had evaluated patient on Jul 17, 2004, and accepted the  patient.  Patient did wish to transfer to rehab to assist in her  postoperative course.  On day of transfer she was regular rate and rhythm,  incisions dry and intact and healing well.  She was hemodynamically stable.  CBGs were stable.  Chest x-ray was stable.  She was transferred to rehab on  June 17, 2004, in stable condition.  On transfer to rehab we told her that  we will follow her during her rehab stay and set up a followup appointment  with Dr. Cyndia Bent once she leaves from rehab.  She will have to obtain a PA &  lateral chest x-ray 1 hour prior to this appointment with Dr. Cyndia Bent.  She acknowledged her understanding.  Patient Army Fossa told that she was allowed  to shower with assistance, washing her incisions using soap and water.  We  are to be contacted if she develops any drainage or opening from any of her  incision sites.  She is to do no heavy lifting over 10 pounds.  As stated  above, we will follow patient while in rehab.   DISCHARGE MEDICATIONS:  Please see admission to rehab on Jul 17, 2004, not  available at this time.       KMD/MEDQ  D:  08/13/2004  T:  08/13/2004  Job:  [id number]

## 2010-07-12 NOTE — Cardiovascular Report (Signed)
NAMEMYKAEL, TROTT              ACCOUNT NO.:  [id number]   MEDICAL RECORD NO.:  [id number]          PATIENT TYPE:  INP   LOCATION:                               FACILITY:  [facility]   PHYSICIAN:  Ethelle Lyon, M.D. LHCDATE OF BIRTH:  03/08/1945   DATE OF PROCEDURE:  06/14/2004  DATE OF DISCHARGE:                              CARDIAC CATHETERIZATION   PROCEDURE PERFORMED:  Right heart catheterization, left ventriculography,  coronary angiography, StarClose closure of the right common femoral  arteriotomy site.   SURGEON:  Ethelle Lyon, M.D.   INDICATIONS:  Ms. Wos is a 65 year old lady who presents with chest  discomfort.  She has been found to have severe aortic stenosis by  echocardiogram.  Left ventricular systolic function is preserved.  Mean  valvular gradient was between 37 and 48 on several measurements on the  echocardiogram with a calculated valve area of 0.7 cm per second.  She was  referred for diagnostic angiography to assess her coronaries prior to  planned aortic valve replacement.   PROCEDURAL TECHNIQUE:  Informed consent was obtained.  Under 1% lidocaine  local anesthesia, a 7-French sheath was placed in the right common femoral  vein and 5-French sheath in the right common femoral artery using the  modified Seldinger technique.  Right heart catheterization was performed  using a balloon-tip catheter.  Coronary angiography was performed using a JL-  4, JL-5, and JR-4 catheters.  The left ventricle was not entered because all  necessary information was obtained from the echocardiogram.  The arteriotomy  was then closed using a StarClose device.  Complete hemostasis was obtained.  The venous sheath was then pulled and manual compression applied.  Complete  hemostasis was obtained.  The patient was transferred to the holding room in  stable condition.   COMPLICATIONS:  None.   FINDINGS:  1.  Hemodynamics:  RA 4/3/2, RV 29/1/5, PA 28/11/18, PCW 16/15/12,  aorta      175/80/117.  2.  Left main coronary:  Angiographically normal.  3.  LAD:  Moderate size vessel giving rise to a single large diagonal.  It      is angiographically normal.  4.  Circumflex:  Large codominant vessel.  It is angiographically normal.  5.  RCA:  Moderate size codominant vessel.  There is a 50% stenosis of the      mid-vessel.   IMPRESSION/RECOMMENDATION:  1.  Moderate single vessel coronary disease with a 50% stenosis of the mid-      right coronary artery.  2.  Critical aortic stenosis is demonstrated on echocardiogram.  3.  Normal right and left heart filling pressures.  4.  The patient will be referred for aortic valve replacement.      WED/MEDQ  D:  06/19/2004  T:  06/19/2004  Job:  [id number]   cc:   Evette Doffing, M.D.  [address] [place]  [place] [address]  Fax: [phone]   Minus Breeding, M.D.

## 2010-07-12 NOTE — Consult Note (Signed)
[name], [name]              ACCOUNT NO.:  [id number]   MEDICAL RECORD NO.:  [id number]          PATIENT TYPE:  INP   LOCATION:  2316                         FACILITY:  [facility]   PHYSICIAN:  Dola Argyle, M.D.     DATE OF BIRTH:  [birth year]   DATE OF CONSULTATION:  07/07/2004  DATE OF DISCHARGE:                                   CONSULTATION   [name] was recently hospitalized in April 2006 at [facility]. At that  time, she was found to have severe aortic stenosis.  There was stenosis of  her right coronary artery.  She had a dental procedure in preparation for  aortic valve surgery.  On Jul 05, 2004, the patient was readmitted for  cardiac surgery. This was done by Dr. Cyndia Bent.  She received a vein graft to  the right coronary artery and aortic valve replacement with a 21 mm  pericardial tissue valve.  During the evening and into the day today, the  patient has had altered mental status.  The etiology is  not clear, but she  had hypotension with it. With her ongoing hypotension, Dr. Roxan Hockey was  at the beside and was quite concerned.  I was called to see the patient  immediately for an echocardiogram and to help assess whether should could  have a tamponade or other primary cardiac basis for her problem.  A bedside  portable echocardiogram was done by me with good images.  There is no  official recording of this and no official report.  There is no pericardial  effusion.  There as good LV function and RV function.  The aortic prosthesis  appeared to be working well. There was heavy mitral annular calcification.  All of this data were reviewed with Dr. Roxan Hockey.  He then decided to  reintubate the patient and push fluid and use pressors, and she is now more  stable.   PAST MEDICAL HISTORY:   ALLERGIES:  No known drug allergies.   MEDICATIONS:  The medications are her current in-hospital-based medicines.  Previously, prior to this admission, she was on:  1.  Metformin  500 b.i.d.  2.  Zantac.  3.  Lisinopril 40 daily.  4.  Amitriptyline 50.  5.  Iron.  6.  Amoxicillin since her dental work.  7.  Norvasc 5.  8.  Glipizide 10.  9.  Zocor 40.   OTHER MEDICAL PROBLEMS:  See the complete list below.   SOCIAL HISTORY:  The patient lives in [place] and does not smoke.  She is  not employed at this time.   FAMILY HISTORY:  Family history does not reveal coronary disease at a young  age; however, she has brothers and sisters with diabetes and hypertension.   REVIEW OF SYSTEMS:  At this time, the patient's Review of Systems could not  be obtained as she is intubated and sedated.   PHYSICAL EXAMINATION:  GENERAL: When seen, the patient was lethargic.  Shortly after that, she was intubated. Her respiratory effort is controlled  by the respirator.  There was question of some rhonchi.  HEENT:  Exam was limited.  CARDIAC:  S1 with an S2.  No aortic insufficiency is heard.  CHEST:  Chest wound is clean and stable.  ABDOMEN:  Abdomen is soft.  She has no significant peripheral edema at this  time.  EKG is pending at this point.   PROBLEM LIST:  1.  History of hyperlipidemia.  2.  Diabetes.  3.  History of breast cancer.  4.  Status post total abdominal hysterectomy.  5.  * Jehovah's Witness.  6.  Hypertension.  7.  Status post dental extractions done recently preoperatively.  8.  Good left ventricular function.  9.  Mild coronary disease.  10. History of severe aortic stenosis.  11. * Status post saphenous vein graft to the right coronary artery      (Coronary artery bypass grafting) and status post #21 pericardial tissue      valve in the aortic position on Jul 05, 2004.  12. * Varying mental status and hypotension.   At this point, I doubt a primary cardiac basis of the events.  I suggest  pressors, fluid, and reintubation as has already been done.  I will arrange  for followup complete echocardiogram tomorrow.  Also, I would consider  keeping  in mind the possibility of a sepsis and also keep in mind the status  of the ascending aorta over time.      JK/MEDQ  D:  07/07/2004  T:  07/07/2004  Job:  [id number]   cc:   Evette Doffing, M.D.  [address]. [place]  [place] [address]  Fax: [phone]   Minus Breeding, M.D.

## 2010-07-12 NOTE — H&P (Signed)
[name], [name]              ACCOUNT NO.:  [id number]   MEDICAL RECORD NO.:  [id number]          PATIENT TYPE:  IPS   LOCATION:  6734                         FACILITY:  [facility]   PHYSICIAN:  Meredith Staggers, M.D.DATE OF BIRTH:  [birth year]   DATE OF ADMISSION:  07/17/2004  DATE OF DISCHARGE:                                HISTORY & PHYSICAL   CHIEF COMPLAINT:  Left sided weakness.   HISTORY OF PRESENT ILLNESS:  This is a 65 year old black female with a  history of aortic stenosis and RCA disease who was admitted for an AVR and  CABG on Jul 05, 2004.  The patient experienced postoperative hypotension and  mental status changes and her course was complicated also by ventilator-  dependent respiratory failure and shock for a short period of time.  Workup,  on Jul 08, 2004, ultimately revealed a new density in the right  periventricular area.  She is felt to have a multifactorial stroke.  She had  60-80% carotid stenosis on the right.  Aspirin was recommended with  potential for revascularization in the future.  The patient was placed on  Aranesp for a hemoglobin of 7.4.  The patient is a Restaurant manager, fast food.  The  patient continues to need assistance with mobility and transfers.  She is  45% transfers today with therapy.  She needed her knees blocked for support.  She is min to mod assist with bed mobility.   REVIEW OF SYSTEMS:  The patient reports generalized weakness, chest  discomfort due to surgery, mild reflux, blurred vision which is improving,  urinary frequency.   PAST MEDICAL HISTORY:  1.  Left breast cancer, status post mastectomy.  2.  Dyslipidemia.  3.  Hysterectomy.  4.  Cholecystectomy.  5.  Peripheral neuropathy.  6.  Diabetes.  7.  Hypertension.  8.  Questionable cognitive/memory deficits.   FAMILY HISTORY:  Positive for diabetes.   SOCIAL HISTORY:  She lives alone and has a daughter who can help her  intermittently.  The daughter works at night.  She is  independent prior to  arrival.   MEDICATIONS:  Prior to arrival include:  Colace, Zantac, Lisinopril,  Glucotrol, Elavil, Norvasc, Zocor.   ALLERGIES:  None.   Most recent hemoglobin was 7.4, on Jul 14, 2004, platelets 364, white count  9.4.  Sodium 134, potassium 4.5, BUN and creatinine 12 and 0.9.   PHYSICAL EXAMINATION:  GENERAL:  The patient is a bit fatigued but otherwise  alert and appropriate when stimulated.  VITAL SIGNS:  Blood pressure is 124/75, pulse is 84, temperature 98.4,  respiratory rate is 16.  EYES:  Intact without irritation or icterus.  She has some mild left sided  ptosis.  ORAL CAVITY:  She is missing a few teeth on oral examination, otherwise oral  mucosa is pink and moist.  NECK:  Supple without JVD or adenopathy.  CHEST:  Clear to auscultation bilaterally with a central sternal wound noted  which is appropriately tender.  Left mastectomy is noted as well.  HEART:  Regular rate and rhythm without murmurs, rubs, or gallops.  ABDOMEN:  Soft, nontender with minimal distention.  She did have positive  bowel sounds today.  NEUROLOGIC:  The patient had mild weakness of the left upper extremity at 3+  to 4 out of 5.  The left lower extremity  was also in the 3+ to 4 out of 5  range.  Right side was 4+ to 5 out of 5 throughout.  Reflexes are 1+.  Sensory slightly decreased in the distal extremities.  Cognition was  generally appropriate for me today.  Cranial nerve exam revealed a mild left  central VII.   ASSESSMENT/PLAN:  1.  Functional deficits secondary to postoperative stroke involving the      right hemisphere.      1.  Begin comprehensive inpatient rehabilitation to include physical          therapy, occupational therapy, speech therapy, RN, M.D., and social          Development worker, community.      2.  Estimated length of stay is two weeks.      3.  Prognosis is fair.      4.  Goals are modified independent.      5.  Anticoagulation with aspirin.      6.   Deep vein thrombosis prophylaxis with subcutaneous Lovenox.  2.  Diabetes.  Continue to watch sugars on Glucotrol and Glucophage.  3.  Postoperative ileus.  Apparently is resolving.  Observe clinically.  4.  Anemia.  The patient is a Restaurant manager, fast food.  Currently is on Aranesp as      well as iron supplement daily.  We will monitor serial hemoglobin and      hematocrit.  5.  CAD/AS.  S/P CABG and AVR      ZTS/MEDQ  D:  07/17/2004  T:  07/17/2004  Job:  [id number]

## 2010-07-12 NOTE — Op Note (Signed)
[name], [name]              ACCOUNT NO.:  [id number]   MEDICAL RECORD NO.:  [id number]          PATIENT TYPE:  INP   LOCATION:  2316                         FACILITY:  [facility]   PHYSICIAN:  Glynda Jaeger, M.D.  DATE OF BIRTH:  01-Mar-1945   DATE OF PROCEDURE:  07/05/2004  DATE OF DISCHARGE:                                 OPERATIVE REPORT   PROCEDURE:  Intraoperative transesophageal echocardiography.   [name] is a 65 year old African-American female with a history  of aortic stenosis and single vessel coronary artery disease who is  scheduled to undergo aortic valve replacement and coronary artery bypass  grafting by Dr. Cyndia Bent.  Intraoperative transesophageal echocardiography was  requested to evaluate the aortic valve and to determine if any other  valvular pathology was present and to assess the left ventricular function.   The patient was brought to the operating room at [facility] and  general anesthesia was induced without difficulty.  The trachea was  intubated without difficulty.  The transesophageal echocardiography probe  was then inserted in the esophagus without difficulty.   IMPRESSION:   PRE-BYPASS FINDINGS:  1.  Aortic valve.  There was severe aortic stenosis.  The valve appeared to      be bileaflet with a raphae between the right and left coronary cusps and      heavy calcification in the area of the right and left coronary cusps      which were fused.  The area of the noncoronary cusp opened but the other      leaflet which was the fusion of the right and left coronary cusp was      immobile.  The planimetry of the aortic valve opening revealed an area      of 0.68 sq cm.  I was unable to obtain satisfactory views with      continuous wave Doppler interrogation of the aortic outflow tract.      Measurement of the aortic anulus measured 1.95 sq cm.  The aortic root      through the sinotubular ridge measured 2.79 and the proximal aortic  root      distal to the sinotubular ridge was 2.93 cm.  There was no aortic      insufficiency noted.  There did not appear to be any significant      atheromatous disease or calcification of the proximal ascending aorta.  2.  Mitral valve.  There was moderate calcification of the mitral anulus in      the area of the base of the posterior leaflet.  However, the mitral      valve appeared to open normally and there was trace mitral      insufficiency.  There was no prolapse or fluttering of the leaflets and      there was good coaptation of the leaflets in all views interrogated.  3.  Left ventricle.  The left ventricular size was normal.  There was      moderate to severe left ventricular hypertrophy which was concentric.      The left ventricular wall  thickness measured 1.68 cm at end-diastole at      the mid-papillary level of the anterior wall.  There was near      obliteration of the left ventricular cavity and systole and ejection      fraction was estimated at 65-70%.  All segments of the left ventricular      cavity appeared to move normally.  4.  Right ventricle.  The right ventricular size was normal.  There was good      contractility of the right ventricular free wall and normal systolic      excursion of the tricuspid anulus.  5.  Tricuspid valve.  The tricuspid valve leaflets appear to open normally      and there was trace tricuspid insufficiency.  6.  Interatrial septum.  The interatrial septum was intact without evidence      of patent foramen ovale or atrial septal defect.  7.  Left atrium.  Left atrial size appeared to be within normal limits and      there was no thrombus noted in the left atrium or left atrial appendage.  8.  Descending aorta.  The descending aorta appeared to have mild      atheromatous disease and measured 2.79 cm in diameter.   POST-BYPASS FINDINGS:  1.  Aortic valve.  There was a bioprosthetic valve noted in the aortic      position.  The  leaflets appeared to open normally.  There was no aortic      insufficiency noted.  The valve appeared well seated.  2.  Mitral valve.  The mitral valve was unchanged from the pre bypass study.      There was moderate mitral anular calcification in the area of the base      of the posterior leaflet but there was trace mitral insufficiency and      the mitral valve opened normally.  3.  Left ventricle.  The left ventricular function was unchanged from the      pre bypass study.  There was moderate to severe left ventricular      hypertrophy which was concentric.  Ejection fraction measured 65-70%.  4.  Right ventricle.  The right ventricular function was unchanged from the      pre bypass study.  There was normal contractility of the right      ventricle.      DCJ/MEDQ  D:  07/06/2004  T:  07/06/2004  Job:  [id number]

## 2010-07-12 NOTE — Procedures (Signed)
[place]  Patient:    [name], [name]                       MRN: [id number] Proc. Date: 07/05/99 Adm. Date:  [id number] Disc. Date: [id number] Attending:  Ardeth Perfect.                           Procedure Report  PROCEDURES:  Video upper endoscopy and video colonoscopy.  INDICATIONS:  A 65 year old female who has epigastric pain, atypical chest discomfort, guaiac positive stool, and blood on the toilet tissue.  In addition, she is status post breast cancer.  PREPARATION:  She was n.p.o. since midnight, having taken Phospho-Soda prep and a clear liquid diet.  The mucosa was clean.  PREPROCEDURE SEDATION:  Prior to the upper endoscopy, she received 50 mg of Demerol and 4 mg of Versed intravenously.  In addition, her throat was anesthetized with Hurricaine spray, and she was on 2 L of nasal cannula O2.  DESCRIPTION OF PROCEDURE:  The Olympus video upper endoscope was inserted via the mouth and advanced easily to the descending duodenum.  On withdrawal, the mucosa was carefully evaluated and found to be entirely normal from the descending duodenum through the stomach to the esophagus.  No abnormalities, peptic disease, or other lesions were noted.  The patient tolerated this procedure well.  At its conclusion, her position was reversed for a video colonoscopy, and she received an additional 30 mg of Demerol and 2 mg of Versed.  The Olympus video colonoscope was inserted via the rectum and advanced through a somewhat tortuous sigmoid colon to the hepatic flexure. Extra abdominal pressure and rotation onto her back had to be utilized in order to achieve the cecum.  Cecal landmarks were identified.  The ileocecal valve was photographed.  On withdrawal, the mucosa was carefully evaluated. The entire right colon appeared normal.  There was mild diverticulosis of the left colon, but no other abnormalities were noted.  Retroflexed view of the rectum  was unremarkable.  The patient tolerated both procedures well.  Pulse, blood pressure, and oximetry testing were stable throughout.  She was observed in recovery for 45 minutes and discharged home, alert with a benign abdomen.  IMPRESSION: 1. Normal upper endoscopy. 2. Left-sided diverticulosis of the colon. 3. Probable insignificant rectal bleeding and heme-positive stool likely    related to a hemorrhoid or fissure that has resolved.  It should be noted    that her preprocedure hemoglobin was 15.  Helicobacter pylori antibody test    was negative, and liver function tests were normal with the exception of an    alkaline phosphatase of 165.  Since her GGT was normal, it was likely    alkaline phosphatase was coming from bone.  PLAN:  The patient may return to the office to see me if needed, otherwise, she should return to the care of Dr. ______.  She may continue taking ______ 40 mg in the morning if needed for reflux-type discomfort. DD:  07/05/99 TD:  07/08/99 Job: [id number]

## 2010-07-12 NOTE — Consult Note (Signed)
[name], [name]              ACCOUNT NO.:  [id number]   MEDICAL RECORD NO.:  [id number]          PATIENT TYPE:  INP   LOCATION:  2316                         FACILITY:  [facility]   PHYSICIAN:  Larey Seat, M.D.  DATE OF BIRTH:  03-24-1945   DATE OF CONSULTATION:  07/07/2004  DATE OF DISCHARGE:                                   CONSULTATION   HISTORY:  At the time this code stroke is called it is 7:30 a.m. on Sunday  morning, Jul 07, 2004.  The patient had undergone cardiac surgery 2 days  prior ago prior to this development.  [name] is a 65 year old right-  handed African-American patient listed as a patient of Dr. Gilford Raid of  the [facility].  The patient had apparently done well on Saturday after her  cardiac surgery.  She has a history of having an aortic valve replacement  with a Parsing tissue valve, has a history of diabetes mellitus,  hypertension, hypercholesterolemia, morbid obesity, and an echocardiogram  performed at the bedside by Dr. Ron Parker had not shown any blood loss or any  cardiac akinesias.  [name] was this morning hypotensive and a code  stroke was called and is receiving Dopamine pressures and fluids to cover  her generalized nonfocal weakness.  A CT of the head has been obtained but  shows no loss of gray white matter differentiation.  Also her deficits and  her waxing and waning status as well as a hypotensive blood pressure were  already known for over 16 hours when the code stroke was called.  The  patient was intubated to help protect her airways and more pressures were  given while placing an NG tube through her left nostril.  She was able to  move both upper extremities and shrug both shoulders.  She never moved  spontaneously or otherwise any of her lower extremities.   CONCLUSION:  The patient is too fresh out of surgery to do an MRI.  A CT has  not shown any acute ischemic stroke development.  I suggested that they can  follow [name] with a CERT CT should she not recover from her deficits  after her hypotensive episode is over for 24 hours.  In addition, I have  suggested to look out for sepsis and to see why she is hypotensive.  Dr.  Roxan Hockey  was at the bedside and chose to call in Dr. Ron Parker and  recommending antibiotics.      CD/MEDQ  D:  07/08/2004  T:  07/08/2004  Job:  [id number]

## 2010-07-12 NOTE — Discharge Summary (Signed)
NAMEJAZMAN, [name]              ACCOUNT NO.:  [id number]   MEDICAL RECORD NO.:  [id number]          PATIENT TYPE:  IPS   LOCATION:  3817                         FACILITY:  [facility]   PHYSICIAN:  Charlett Blake, M.D.DATE OF BIRTH:  05-14-1945   DATE OF ADMISSION:  07/17/2004  DATE OF DISCHARGE:  08/01/2004                                 DISCHARGE SUMMARY   DISCHARGE DIAGNOSES:  1.  Embolic cerebrovascular accident, post aortic valve replacement with      coronary artery bypass grafting.  2.  Hypertension.  3.  Diabetes mellitus type 2.  4.  Neurogenic bladder with urinary retention.   HISTORY OF PRESENT ILLNESS:  [name] is a 65 year old female with  history of aortic stenosis and coronary artery disease and acute event.  She  was admitted May 12 for AVR and underwent CABG x1 by Dr. Cyndia Bent.  Postop  patient with acute mental status changes and hypertension requiring pressors  in ICU with consultant input.  CT scan of the head done on May 13 and May 14  showed no acute changes.  The patient required vent support initially, but  tolerated extubation without difficulty on May 16.  Followup head CT  done  May 15 showed small new density in right periventricular area with CVA being  multifactorial.  Carotid dopplers done showed right 60-79% stenosis.  Neuro  recommended aspirin for CVA prophylaxis and a followup with [facility] for  revascularization in the future.  The patient has had issues with  postoperative anemia.  No blood products transfused as the patient is a  Restaurant manager, fast food.  She started on Aranesp for hemoglobin of 7.4.  She also  has issues with postoperative ileus that is resolving as mental status is  slowly improving.  She is A & O x3.  PT and OT are ongoing and patient noted  to have deficits in mobility.  CIR was consulted for further therapy.   HOSPITAL COURSE:  [name] is admitted to rehab on Jul 17, 2004  for inpatient therapies to consist of PT,  OT and speech therapy daily.  She  was maintained on aspirin for CVA prophylaxis.  Subcutaneous Lovenox was  added for DVT prophylaxis secondary to decrease in mobility.  She was  maintained on Aranesp initially for postoperative anemia.  Last check of CBC  was June 8, she was noted to be resolving nicely with hemoglobin at 10, and  hematocrit 29.8, white count 6.8, platelets 446.  She had __________ that  revealed sodium 136, potassium 3.6, chloride 104, C02 30, BUN 10, creatinine  0.7, glucose 89.  Foley was initiated and continued this admission.  This  was discontinued on May 27.  UA and UCS were checked, showing greater than  100,000 colonies of Kleb pneumoniae.  She was treated with Keflex for this.  Voiding was monitored with PVR checks and the patient was noted to have  problems voiding with problems initially void despite addition of Flomax and  Urecholine.  Urecholine dose was slowly increased without any initiation of  voiding.  She was noted to have  high bladder volumes at night from 500 to  800 mL. Foley was replaced on June 8 and urology followup was set up for a  voiding trial in the future.  The patient, however,  reported that she would  have financial difficulties in keeping the GU appointment.  Her LMD was  contacted regarding question of voiding trials at office visit with followup  with GU in the future.   Speech therapy evaluation showed patient with problems with delayed recall.  This slowly improved during her stay.  The patient was able to express basic  needs without difficulty, high level expression was intact, however, she  required encouragement for verbal output.  High level comprehension was  mildly impaired.  No signs of apraxia or dysarthria.  She was able to follow  multi-step commands without difficulty.  The patient was noted to have  problems with bilateral lower extremities with weakness, left greater than  right, decreased balance and endurance.   During her stay, the patient  progressed well.  She was at modified independent for upper body self-care,  supervision for lower body self-care and for toileting.  She was at  supervision for transfers, close supervision for ambulating greater than 250  feet with rolling walker.  Family is to provide supervision care needed past  discharge.  Further followup therapy set up by [facility] to provide  PT, OT and RN.   On August 01, 2004, the patient was discharged to home.   DISCHARGE MEDICATIONS:  1.  Coated aspirin 325 mg daily.  2.  Macrobid 100 mg daily.  3.  Protonix 40 mg daily.  4.  Zocor 40 mg daily.  5.  Lopressor 25 mg b.i.d.  6.  Ferrous sulfate 325 mg b.i.d.  7.  Prinivil 40 mg a day.  8.  Norvasc 5 mg a day.  9.  Lipitor 10 mg in the a.m., 5 mg in the p.m.  10. Glucophage 850 mg b.i.d.  11. Diflucan 100 mg a day through June 10.   ACTIVITY:  Twenty-four supervision for now.   DIET:  Diabetic diet balanced meals.   WOUND CARE:  Wash daily with soap and water.  Keep clean and dry.   SPECIAL INSTRUCTIONS:  No alcohol, no smoking, no driving.   FOLLOW UP:  The patient to follow up with Dr. Letta Pate July 11 at 10:45.  Follow up with Dr. Cyndia Bent for postoperative check in three weeks.  Follow up  with Dr. Chriss Czar in outpatient clinic in two weeks.       PP/MEDQ  D:  09/10/2004  T:  09/11/2004  Job:  [id number]   cc:   Pramod P. Leonie Man, MD  Fax: [phone]   Gilford Raid, M.D.  [address]  [place]  [place] [address]  Fax: [phone]

## 2010-07-12 NOTE — Consult Note (Signed)
NAMEAIMEE, [name]              ACCOUNT NO.:  [id number]   MEDICAL RECORD NO.:  [id number]          PATIENT TYPE:  INP   LOCATION:  3557                         FACILITY:  [facility]   PHYSICIAN:  Minus Breeding, M.D.   DATE OF BIRTH:  [birth year]   DATE OF CONSULTATION:  DATE OF DISCHARGE:                                   CONSULTATION   DATE OF CONSULTATION:  June 18, 2004.   PRIMARY CARE PHYSICIAN:  At the [facility].   PATIENT PROFILE:  A 65 year old, African-American female, who reports to the  hospital with at least a one-month history of left arm and chest discomfort.   PAST MEDICAL HISTORY/PROBLEM LIST:  1.  Chest pain.  2.  Hypertension.  3.  Hyperlipidemia.  4.  Type 2 diabetes mellitus.  5.  History of breast CA, status post chemotherapy with Tamoxifen in 1996.  6.  Status post TAH approximately 18 years ago.   HISTORY OF PRESENT ILLNESS:  A 65 year old, African-American female with the  above-problem list, who was in her usual state of health until approximately  one month ago when she began to experience rest and exertional 6 out of 10  left arm tightness without associated symptoms lasting up to one hour and  relieved spontaneously.  She was seen in the ED March 24th for elevated  blood pressure (230/116 and hyperglycemia 433) and at that time was ruled  out, reinitiated on medications, and discharged.  Over the past two weeks,  she has been experiencing resting 6 out of 10 substernal chest tightness  associated with globus sensation and diaphoresis as well as shortness of  breath lasting approximately 10 minutes and relieved with deep breathing and  relaxation.  The symptoms have been occurring approximately 35 times per  week.  She had a routine followup appointment today in the outpatient clinic  and following reporting these symptoms she was directly admitted for further  evaluation.  She is currently pain free.   ALLERGIES:  No known drug  allergies.   CURRENT MEDICATIONS:  1.  Glucophage 1 gm b.i.d.  2.  Glucotrol 10 mg daily.  3.  Lisinopril 40 mg daily.  4.  HCTZ 25 mg daily.  5.  Diltiazem-CD 180 mg daily.  6.  Zocor 40 mg q.h.s.  7.  Enteric-coated aspirin 81 mg daily.   FAMILY HISTORY:  Mother is age 58 with diabetes and peripheral vascular  disease.  Father died at age 38 of CAD and hyperlipidemia.  She has four  brothers and four sisters positive for diabetes and hypertension.   SOCIAL HISTORY:  She lives in [place].  She denies any tobacco, alcohol,  or drug use, and does not exercise.   REVIEW OF SYSTEMS:  Positive for chills and diaphoresis.  HEENT:  Positive  for headache.  CARDIOPULMONARY:  Positive for chest pain, shortness of  breath, and dyspnea.  She has frequency of urination, depression, anxiety,  and diabetes.  All other systems reviewed and negative.   PHYSICAL EXAMINATION:  VITAL SIGNS:  Temperature 98.6, heart rate 83,  respirations 16, blood pressure  145/88.  GENERAL:  A pleasant, African-American female in no acute distress; awake,  alert, and oriented x3.  NECK:  Normal carotid upstrokes with radiated murmur.  LUNGS:  Respirations regular and nonlabored.  Clear to auscultation.  CARDIAC:  Regular S1 with a widely split S2.  She has a 3/6 systolic  ejection murmur at the right upper sternal border, which is heard  throughout.  ABDOMEN:  Round, soft, nondistended, nontender.  Bowel sounds present x4.  EXTREMITIES:  Warm, dry.  No clubbing, cyanosis, or edema.  Dorsalis pedis  and posterior tibial pulses 2+ and equal bilaterally.   ACCESSORY CLINICAL FINDINGS:  Chest x-ray shows no active disease.  Her EKG  shows sinus rhythm  with a high grade first-degree AV block with a PR  interval of about 380 msec.  Rate is 80 beats a minute.  She does have LVH  as well as T-wave inversions in V-4 and V-6.   LABORATORY DATA:  Hemoglobin 13.8, hematocrit 39.4, WBC 7.2, platelets 241.  Sodium 138,  potassium 3.9, chloride 106, CO2 27, BUN 14, creatinine 0.8,  glucose 141, CK 53, MB 1.3, troponin-I 1.02.   ASSESSMENT AND PLAN:  1.  Chest pain.  Patient has at least a two to three week history of rest      and exertional left arm and substernal chest tightness lasting      approximately 10 to 60 minutes and relieved with rest or deep breathing.      She now states that these symptoms may have been occurring      intermittently for the past year, but really cannot pinpoint it.  Given      her long history of poorly controlled diabetes and hypertension will      plan on a right and left heart cardiac catheterization tomorrow.  Will      __________, add an aspirin and statin to her current medications, and      she is already on an ACE inhibitor.  She is not currently on beta      blocker and we will not initiate a beta blocker secondary to her high      grade first-degree AV block.  2.  Hypertension:  This appears to be under better control for this patient,      being in the 140 range.  Continue ACE inhibitor, HCTZ, and Diltiazem.      No beta blocker secondary to high grade first-degree AV block.  3.  Hyperlipidemia:  Given history of diabetes regardless of coronary      status, we have added Zocor 40 mg q.h.s.  4.  Aortic stenosis:  Patient had an echocardiogram and at least      preliminarily it appears that she has moderate to severe aortic stenosis      with peak gradients in the 70 range and a mean gradient greater than 40.      She will undergo right and left heart cardiac catheterization tomorrow.      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      CRB/MEDQ  D:  06/18/2004  T:  06/18/2004  Job:  [id number]

## 2010-07-12 NOTE — Op Note (Signed)
[name], [name]              ACCOUNT NO.:  [id number]   MEDICAL RECORD NO.:  [id number]          PATIENT TYPE:  INP   LOCATION:  7106                         FACILITY:  [facility]   PHYSICIAN:  Lenn Cal, D.D.S.DATE OF BIRTH:  03-12-1945   DATE OF PROCEDURE:  06/24/2004  DATE OF DISCHARGE:                                 OPERATIVE REPORT   PREOPERATIVE DIAGNOSIS:  1.  Aortic stenosis.  2.  Pre-aortic valve replacement heart surgery dental protocol.  3.  Chronic apical periodontitis.  4.  Chronic periodontitis.  5.  Accretions.   POSTOPERATIVE DIAGNOSIS:  1.  Aortic stenosis.  2.  Pre-aortic valve replacement heart surgery dental protocol.  3.  Chronic apical periodontitis.  4.  Chronic periodontitis.  5.  Accretions.   OPERATION:  1.  Dental examination.  2.  Extraction of teeth numbers 1, 2, 3, 32.  3.  Two quadrants of alveoplasty.  4.  Gross debridement of the remaining dentition.   SURGEON:  Lenn Cal, D.D.S.   ASSISTANT:  Oneida Arenas, dental assistant   ANESTHESIA:  Monitored anesthesia care per the anesthesia team.   MEDICATIONS:  1.  Ampicillin 2 grams IV prior to invasive dental procedures.  2.  Local anesthesia with total utilization of 3 carpules each containing 36      mg Xylocaine with 0.018 mg epinephrine.   SPECIMENS:  1.  Four teeth which were discarded.  2.  One Tooth (#3) three was submitted along with the soft tissue to      evaluate the soft tissue for possible pathology or malignancy.   CULTURES:  None.   DRAINS:  None.   COMPLICATIONS:  None.   ESTIMATED BLOOD LOSS:  Less than 50 mL.   FLUIDS REPLACED:  400 mL normal saline solution.   INDICATIONS FOR PROCEDURE:  The patient was admitted with a history of  aortic stenosis.  Patient with anticipated aortic valve replacement heart  surgery.  The patient was seen as part of a pre-heart valve surgery dental  protocol to rule out dental infection which may affect the  patient's  systemic health and anticipated heart valve surgery.  The patient was  evaluated and treatment plan for multiple extractions with alveoloplasty and  gross debridement of the remaining dentition is indicated.   OPERATIVE FINDINGS:  The patient was examined in operating room number 1.  Teeth numbers 1, 2, 3, and 32 were evaluated.  Significant periodontal  disease was found to be associated with teeth numbers 1, 2, 3, and 32.  Pockets on the distal of number 3 and mesial of 32 were greater than 12 mm.  The patient, therefore, was noted to be affected by chronic apical  periodontitis, chronic periodontitis, accretions, and significant tooth  mobility.  The aforementioned necessitated the removal of 1, 2, 3, and 32  with alveoloplasty and gross debridement of the remaining dentition.   DESCRIPTION OF PROCEDURE:  The patient was brought to the main operating  room 1.  The patient was placed in supine position on the operating table.  Monitored anesthesia care was induced per the  anesthesia team.  The patient  was then prepped and draped in the usual manner for dental medicine  procedure.  The oral cavity was thoroughly examined with the findings as  noted above.  The patient was then readied for the oral surgical procedure  as follows:   Local anesthesia was administered sequentially over the one hour long  procedure with total utilization of 3 carpules containing 36 mg Xylocaine  with 0.018 mg epinephrine.  The maxillary right quadrant was first  approached.  Anesthesia was delivered as previously described.  Teeth  numbers 1, 2, and 3 were subluxed with a series of straight elevators and  then removed with the 53R forceps without complications.  The soft tissue  associated with tooth 3 was worrisome for pathology and, therefore, will be  submitted to pathology for evaluation to rule out dental pathology or  malignancy.  At this point, alveoloplasty was performed utilizing  rongeurs  and bone file.  A 15 blade incision was made from the distal of the  tuberosity in a wedge type configuration.  The tissues were approximated and  trimmed appropriately.  The surgical site was then irrigated with copious  amounts of sterile saline.  The surgical site was then closed over the  maxillary right tuberosity through the distal of number 13 utilizing 3-0  chromic gut suture material and a continuous interrupted suture technique x  1.   The surgeons attention was then drawn to the mandibular right quadrant.  Anesthesia was delivered as previously described.  Tooth mobility was  associated with tooth number 32 and a significant periodontal defect was  noted along the mesial aspect.  This necessitated the removal of this tooth.  Tooth number 32 was then removed with a 23 forceps without complications.  A  15 blade incision was made from the distal of number 32 to the mesial of  number 30.  The surgical flap was carefully reflected, excessive granulation  tissue was then removed, alveoloplasty was then performed utilizing rongeurs  and bone file.  The soft tissues were approximated and trimmed  appropriately.  The surgical site was then irrigated with copious amounts of  sterile saline.  The surgical site was then closed utilizing 3-0 chromic gut  suture in a continuous interrupted suture technique x 1 from the distal of  number 32 to the distal of number 30.   At this point in time, the remaining teeth were then approached.  A KaVo  sonic scaler was then utilized to remove significant accretions.  A series  of hand curettes were then utilized to remove further accretions.  The KaVo  sonic scaler was again utilized to remove accretions.  Significant calculus  was noted to be associated with the mandibular anterior teeth and the  patient will benefit from further scaling procedures with a dentist of her choice in the future once medically stable from the heart valve  surgery.  At  this point in time, the entire mouth was irrigated with copious amounts of  sterile saline.  The patient was examined for complications, seeing none,  the dental medicine procedure was deemed to be complete.  A series of 4 by 4  gauzes were placed in the mouth to aid hemostasis.  The patient was then  handed over to the anesthesia team for final disposition.  After an  appropriate amount of time, the patient was taken to the post anesthesia  care unit with stable stable condition and a good oxygenation level.  All  counts were correct for the dental medicine procedure.  The patient will be  given appropriate pain medications and will be evaluated for suture removal  in approximately one week.  The patient, most likely, will be able to  proceed with heart valve surgery in 2-3 days barring any complications.  Dr.  Cyndia Bent will be informed of the dental clearance appropriately.      RFK/MEDQ  D:  06/24/2004  T:  06/24/2004  Job:  [id number]   cc:   Gilford Raid, M.D.  [address]  [place]  [place] [address]  Fax: [phone]

## 2010-07-12 NOTE — Consult Note (Signed)
NAMEJERNIE, SCHUTT              ACCOUNT NO.:  [id number]   MEDICAL RECORD NO.:  [id number]            PATIENT TYPE:   LOCATION:                                 FACILITY:   PHYSICIAN:  Lenn Cal, D.D.S.DATE OF BIRTH:  07-08-1945   DATE OF CONSULTATION:  06/21/2004  DATE OF DISCHARGE:                                   CONSULTATION   [name] is a 65 year old female referred by Dr. Arvid Right for a  dental consultation.  Patient with recent identification of severe aortic  stenosis.  Patient with anticipate aortic valve replacement heart surgery  with Dr. Cyndia Bent in the future.  The patient is now seeing this [name] for  a pre-heart valve surgery dental protocol to rule out dental infection which  may affect the patient's systemic health and anticipated heart valve  surgery.   MEDICAL HISTORY:  1.  Severe aortic stenosis.      1.  Anticipated aortic valve replacement heart surgery with Dr. Cyndia Bent          in the future.  2.  Coronary artery disease.      1.  Status post recent cardiac catheterization which revealed a 50%          obstruction in the right coronary artery with preserved ejection          fraction of 55-65%.      2.  Possible single vessel bypass grafting procedure along with the          aortic valve replacement as indicated.  3.  Diabetes mellitus--type 2.  4.  Hypertension.  5.  Hyperlipidemia.  6.  History of left breast cancer in 1996.      1.  Status post mastectomy.  7.  Status post total abdominal hysterectomy approximately 1980s.   ALLERGIES/ADVERSE DRUG REACTIONS:  NONE KNOWN.   MEDICATIONS:  1.  Lisinopril 40 mg daily.  2.  Aspirin 325 mg daily.  3.  Protonix 40 mg daily.  4.  Zocor 40 mg every evening.  5.  Lantus 5 units at bedtime.  6.  NovoLog insulin per sliding scale.  7.  Norvasc 5 mg daily.  8.  Chlorhexidine rinses twice daily.   SOCIAL HISTORY:  The patient is unemployed and single.  The patient has  never smoked.   The patient does not use alcohol or other illicit drugs.   FAMILY HISTORY:  Mother with a history of diabetes mellitus.  Father is  deceased in his 62s from coronary artery disease.   FUNCTIONAL ASSESSMENT:  The patient was independent for ADLs prior to this  admission.   REVIEW OF SYSTEMS:  This is reviewed from the chart and health accessory  forms this admission.   DENTAL HISTORY:  Chief complaint:  The patient needs a pre-heart valve  surgery dental protocol evaluation.   HISTORY OF PRESENT ILLNESS:  The patient recently identified with severe  aortic stenosis.  Patient with anticipated aortic valve replacement heart  surgery with Dr. Cyndia Bent.  The patient is now seen as part of pre-heart valve  surgery dental protocol  to rule out dental infection which may affect the  patient's systemic health in anticipated heart valve surgery.   The patient currently denies acute toothache, swellings or abscesses.  The  patient, however, was informed that she has gums which are leaking  approximately two weeks ago.  The patient indicates that the last dental  treatment was to have a filling placed at an evening indigent care dental  clinic.  The patient indicates that her last dental cleaning was a while  ago, which is at least 5-10 years ago.  The patient has no partial dentures.  The patient indicates that her last tooth was pulled approximately three to  four years ago.  The patient denies complications.  The patient most likely  has a mucous retention cyst involving the right maxillary sinus and patient  denies a history of occasional sinus problems.   DENTAL EXAMINATION:  GENERAL:  The patient is a well-developed, well-  nourished female in no acute distress.  VITAL SIGNS:  Blood pressure is 146/84, pulse equal to 80, respirations are  20, temperature is 98.6.  On exam, there is no palpable lymphadenopathy.  The patient denies acute TMJ  symptoms.  INTERNAL EXAM:  The patient has  normal saliva.  There is no evidence of soft  tissue pathology.  There is no evidence of abscess formation.  DENTITION:  Patient is missing multiple teeth #12, 14, 17, 19, and 31.  DENTAL CARIES:  There are no significant dental caries noted, although a  full series of dental radiographs would help identify incipient dental  caries.  ENDODONTIC:  Patient current denies acute pulpitis symptoms.  Patient may  have incipient periapical pathology associated with tooth #2 at this time.  CROWN OR BRIDGE:  There are no crown/bridge restorations noted.  PROSTHODONTIC:  Patient denies present of dentures.  OCCLUSION:  Patient with a poor occlusive scheme secondary to multiple  missing teeth, superior eruption and drifting of the adipose teeth  edentulous areas, multiple diastemas, and lack of replacement of missing  teeth with dental restorations.   RADIOGRAPHIC INTERPRETATIONS:  [facility] x-x-ray was taken on June 20, 2004.   There are multiple missing teeth.  There are multiple diastemas.  There is  superior eruption and drifting of the adipose teeth in the edentulous areas.  There is moderate to severe horizontal vertical bone loss.  There is  possible incipient periapical pathology associated with tooth #2.  There is  a radio-opacity in  the right maxillary sinus which most likely represents a  mucous retention cyst.  A CT scan was done to rule out osteoma and this was  felt to be a mucous retention cyst per that report.   ASSESSMENT:  1.  Plaque and calculus accumulations.  2.  Chronic periodontitis with moderate to severe bone loss.  3.  Gingival recession.  4.  Tooth mobility.  5.  Multiple missing teeth.  6.  Superior eruption and drifting of the adipose teeth into the edentulous      areas.  7.  Multiple diastemas.  8.  Lack of replacement of missing teeth with dental restorations.  9.  Poor occlusal scheme. 10. Possible incipient periapical pathology associated with tooth #2.   11. Need for antibiotic premedication prior to invasive dental procedures.  12. Patient is a Sales promotion account executive [employer] and, therefore, refuses blood or blood      products.   PLAN/RECOMMENDATIONS:  1.  I discussed the risks, benefits and complications of various treatment  options with the patient in relationship to her medical and dental      conditions in anticipated heart valve surgery.  We discussed various      treatment options to include no treatment, multiple extractions with      alveoloplasty, periodontal therapy, dental restorations, crown or bridge      therapy, implant therapy, root canal therapy, and replacing the missing      teeth as indicated.  Patient currently is interested in extracting of      indicated teeth as needed with alveoloplasty and initial periodontal      therapy.  The patient tentatively will agree to extraction of tooth #1      and 2 and possibly #3 and 32 if indicated upon further evaluation in the      operating room.  The patient also agrees to the alveoloplasty required      with these procedures, as well as periodontal therapy.  Patient will      then follow up with the general dentist of her choice for evaluation for      replacement of missing teeth as indicated.  2.  Discussion of findings of with Dr. Cyndia Bent and the internal medicine team      to confirm the medical ability/debility of the patient to undergo      procedures as planned, need for antibiotic premedication, and ability to      proceed with an operating room procedure on Monday, Jun 24, 2004 at      approximately 1 o'clock p.m.      RFK/MEDQ  D:  06/21/2004  T:  06/22/2004  Job:  [id number]   cc:   Gilford Raid, M.D.  [address]  [place]  [place] [address]  Fax: [phone]

## 2010-07-12 NOTE — Discharge Summary (Signed)
NAMESOUL, DEVENEY              ACCOUNT NO.:  [id number]   MEDICAL RECORD NO.:  [id number]          PATIENT TYPE:  INP   LOCATION:  2878                         FACILITY:  [facility]   PHYSICIAN:  Jacquelynn Cree, M.D.   DATE OF BIRTH:  1945-12-21   DATE OF ADMISSION:  06/18/2004  DATE OF DISCHARGE:  06/26/2004                                 DISCHARGE SUMMARY   ATTENDING PHYSICIAN:  Jacquelynn Cree, MD.   RESIDENT:  Elmarie Shiley, MD.   DISCHARGE DIAGNOSES:  1.  Aortic stenosis.  2.  Diabetes.  3.  Chronic apical periodontitis.  4.  Accretion.  5.  Status post extraction of four teeth.  6.  Coronary artery disease, status post recent cardiac catheterization.  7.  Diabetes, type 2.  8.  Hypertension.  9.  Hyperlipidemia.  10. History of left breast cancer in 1996, status post mastectomy.  11. Status post total abdominal hysterectomy in the 1980s.   DISCHARGE MEDICATIONS:  1.  Lisinopril 40 mg daily.  2.  Pepcid OTC 20 mg b.i.d.  3.  Zocor 40 mg at night.  4.  Norvasc 5 mg daily.  5.  Elavil 50 mg q.h.s.  6.  Ferrous gluconate 300 mg b.i.d.  7.  Colace 100 mg q.h.s.  8.  Glucophage 1 gm b.i.d.  9.  Glucotrol 10 mg daily.  10. Amoxicillin 500 mg every 8 hours for 5 days.   CONSULTATIONS:  Dr. Cyndia Bent, CV[facility]; Dr. Enrique Sack, dental; Dr. Percival Spanish,  cardiology; Dr. Chriss Czar, outpatient clinic internal medicine.   The patient is to be discharged home.  She is going to be called by [facility]  regarding scheduling her surgery next week.  She is to follow up with Dr.  Chriss Czar in the outpatient clinic on May 26th at 3:45 p.m.   PROCEDURES PERFORMED DURING HOSPITALIZATION:  1.  A 2-D echo on June 18, 2004, showed overall left systolic ejection      fraction was vigorous EF between 65-75%.  There was no left ventricular      regional wall abnormality identified.  This study was inadequate to rule      out the possibility of bicuspid aortic valve.  The aortic valve      thickness was moderately  increased.  Findings were consistent with      severe aortic valve stenosis.  There was mild aortic valve      regurgitation.  The mean transaortic valve gradient was 78.1.  Estimated      aortic valve area by VMax was 0.68 sq cm.  There was moderate to marked      mitral annular calcification, and the findings were consistent with mild      atrial stenosis.  2.  The patient had teeth extractions on May 1st and had four teeth removed      by Dr. Enrique Sack.  3.  The patient had a cardiac cath on April 21st which showed:  Left main      coronary artery was normal.  LAD was a moderate-sized vessel giving rise      to a single large diagonal;  it is angiographically normal.  The      circumflex had a large codominant vessel; it was normal.  RCA had      moderate-sized codominant vessel; there was 50% stenosis of the valve.      Also noted was critical aortic stenosis demonstrated on echo      cardiography.  4.  She had ABIs and Doppler wave forms that were normal bilaterally at rest      to the lower extremities.   BRIEF ADMISSION HISTORY AND PHYSICAL:  This is a 65 year old African-  American female with history of uncontrolled hypertension, type 2 diabetes  diagnosed 8 years ago, hypercholesterolemia and history of hysterectomy who  came in with a complaint of elevated blood pressure and intermittent  episodes of chest pressure radiating to the left arm over the past 2-3 days.  The episodes are not associated with nausea, vomiting or diaphoresis and are  not associated with any particular activity.   PHYSICAL EXAMINATION:  VITAL SIGNS:  Pulse 83, blood pressure 145/88,  temperature 98.6.  GENERAL:  She is in no acute distress.  EYES:  PERRLA.  Extraocular movements are intact.  OROPHARYNX:  Moist.  NECK:  Supple with no JVD.  LUNGS:  Respirations are clear to auscultation bilaterally.  CARDIOVASCULAR:  She had a 4/5 systolic ejection murmur with regular rate  and rhythm, consistent with  an aortic stenosis murmur.  GASTROINTESTINAL:  Soft, nontender and nondistended.  Positive bowel sounds.  EXTREMITIES:  Showed no cyanosis, clubbing or edema.  MUSCULOSKELETAL:  Grossly intact.  NEURO:  No focal deficits.   EKG showed left atrial enlargement, left ventricular hypertrophy and lateral  inverted T waves.  There is no comparison.  She had positive  microalbuminuria on her urine.  TSH was 1.239.  Sodium was 140, potassium  3.5, chloride 104, bicarb 28, BUN 14, creatinine 0.8, glucose 228.   HOSPITAL COURSE:  1.  Chest pain.  Cardiology was consulted, and the patient was ruled out for      MI.  She had had a recent cardiac cath which was negative for critical      coronary artery stenosis.  The patient was continued on aspirin and      Zocor 40 mg daily for her hypercholesterolemia and, for her      hypertension, was switched to lisinopril 40 mg daily and Norvasc 5 mg      daily.  2.  Aortic stenosis.  CVTS surgery was consulted, and prior to surgery, they      wish for her to have dental extractions due to tooth abscess.  The      patient had these removed on May 1st by Dr. Enrique Sack, and Dr. Cyndia Bent is      postponing the surgery for seven days due to the patient being on      aspirin.  The patient will be discharged and contacted by [facility], the PA,      who was notified by Dr. Chriss Czar of her discharge.  She will be contacted      at home and set up with an intake date for her surgery per CVTS.  3.  Hypertension.  The patient will be continued on lisinopril 40 mg daily      and Norvasc 5 mg daily.  4.  Teeth extractions.  The patient will be continued on amoxicillin 500 mg      q.8h. for 5 days per Dr. Ritta Slot recommendations and continue the  mouthwash procedure every 2 hours.  5.  Hypercholesterolemia.  The patient is to be discharged on 40 mg of      Zocor; however, the patient says she will not be able to pay for this     because it is not supplied by the [facility]  [facility], but I have given her      a prescription anyway.  6.  Diabetes.  The patient is to continue on her outpatient regimen of      Glucophage 1 gm b.i.d. and Glucotrol 10 mg daily.  7.  Depression.  The patient is to continue on Elavil 50 mg q.h.s.  8.  Gastroesophageal reflux disease.  The patient is to continue on Pepcid      OTC 20 mg b.i.d.  9.  Hematuria.  The patient had hematuria during the hospitalization.  She      had 21-50 red blood cells in her urine, but this has been attributed to      a traumatic Foley placement.  She is to have the Foley [place], and if she      continues to have hematuria as an outpatient, then we will pursue an      outpatient hematuria workup.   LABORATORY:  On the day prior to discharge, INR is 0.9.  BMET:  Sodium 136,  potassium 4.1, chloride 100, bicarb 26, BUN 10, creatinine 0.8, calcium 9.0.  CBC:  White cell count 7.7, hemoglobin 12.7, platelets 264.      SD/MEDQ  D:  06/26/2004  T:  06/26/2004  Job:  [id number]   cc:   Gilford Raid, M.D.  [address]  [place]  [place] [address]  Fax: [phone]   Minus Breeding, M.D.   Lenn Cal, D.D.S.  [facility]  [address]  [place] [address]  Fax: [phone]

## 2010-07-27 ENCOUNTER — Other Ambulatory Visit: Payer: Self-pay | Admitting: Internal Medicine

## 2010-07-29 NOTE — Telephone Encounter (Signed)
Message sent to [place] for an appt.

## 2010-07-29 NOTE — Telephone Encounter (Signed)
Please sch pt appt within the next 90 days. Thanks.

## 2010-08-19 ENCOUNTER — Other Ambulatory Visit: Payer: Self-pay | Admitting: Internal Medicine

## 2010-08-19 NOTE — Progress Notes (Signed)
Letter regarding simvastatin 40 and amlodipine 10. Will reduce simvastatin to 20 as last LDL very well controlled but BP not so great.

## 2010-09-13 ENCOUNTER — Encounter: Payer: Self-pay | Admitting: Internal Medicine

## 2010-09-13 ENCOUNTER — Other Ambulatory Visit: Payer: Self-pay | Admitting: Internal Medicine

## 2010-09-13 NOTE — Telephone Encounter (Signed)
Has appt later this month so that I can ask why she is on Toprol XL BID dosing.

## 2010-09-16 ENCOUNTER — Other Ambulatory Visit: Payer: Self-pay | Admitting: Internal Medicine

## 2010-09-16 MED ORDER — BD ULTRA-FINE LANCETS MISC: Status: DC

## 2010-09-18 ENCOUNTER — Ambulatory Visit: Payer: Self-pay | Admitting: Internal Medicine

## 2010-10-08 ENCOUNTER — Other Ambulatory Visit: Payer: Self-pay | Admitting: Internal Medicine

## 2010-10-12 ENCOUNTER — Other Ambulatory Visit: Payer: Self-pay | Admitting: Internal Medicine

## 2010-10-14 NOTE — Telephone Encounter (Signed)
Has appt next month with me

## 2010-10-24 ENCOUNTER — Other Ambulatory Visit: Payer: Self-pay | Admitting: Internal Medicine

## 2010-10-25 ENCOUNTER — Other Ambulatory Visit: Payer: Self-pay | Admitting: *Deleted

## 2010-10-25 MED ORDER — INSULIN PEN NEEDLE 31G X 8 MM MISC: Status: DC

## 2010-10-29 ENCOUNTER — Ambulatory Visit: Payer: Self-pay | Admitting: Internal Medicine

## 2010-11-12 ENCOUNTER — Ambulatory Visit (INDEPENDENT_AMBULATORY_CARE_PROVIDER_SITE_OTHER): Payer: Medicare Other | Admitting: Internal Medicine

## 2010-11-12 ENCOUNTER — Encounter: Payer: Self-pay | Admitting: Internal Medicine

## 2010-11-12 VITALS — BP 188/81 | HR 64 | Temp 97.6°F | Wt 183.0 lb

## 2010-11-12 DIAGNOSIS — Z0000 Encounter for general adult medical examination without abnormal findings: Secondary | ICD-10-CM

## 2010-11-12 DIAGNOSIS — I10 Essential (primary) hypertension: Secondary | ICD-10-CM

## 2010-11-12 DIAGNOSIS — Z23 Encounter for immunization: Secondary | ICD-10-CM

## 2010-11-12 DIAGNOSIS — I2510 Atherosclerotic heart disease of native coronary artery without angina pectoris: Secondary | ICD-10-CM

## 2010-11-12 DIAGNOSIS — C50919 Malignant neoplasm of unspecified site of unspecified female breast: Secondary | ICD-10-CM

## 2010-11-12 DIAGNOSIS — E785 Hyperlipidemia, unspecified: Secondary | ICD-10-CM

## 2010-11-12 DIAGNOSIS — I639 Cerebral infarction, unspecified: Secondary | ICD-10-CM

## 2010-11-12 DIAGNOSIS — E119 Type 2 diabetes mellitus without complications: Secondary | ICD-10-CM

## 2010-11-12 DIAGNOSIS — I6350 Cerebral infarction due to unspecified occlusion or stenosis of unspecified cerebral artery: Secondary | ICD-10-CM

## 2010-11-12 DIAGNOSIS — F329 Major depressive disorder, single episode, unspecified: Secondary | ICD-10-CM

## 2010-11-12 LAB — POCT GLYCOSYLATED HEMOGLOBIN (HGB A1C): Hemoglobin A1C: 8.3

## 2010-11-12 LAB — GLUCOSE, CAPILLARY: Glucose-Capillary: 235 mg/dL — ABNORMAL HIGH (ref 70–99)

## 2010-11-12 MED ORDER — PAROXETINE HCL 20 MG PO TABS: 20.0000 mg | ORAL_TABLET | ORAL | Status: DC

## 2010-11-12 MED ORDER — AMLODIPINE BESYLATE 10 MG PO TABS: 10.0000 mg | ORAL_TABLET | Freq: Every day | ORAL | Status: DC

## 2010-11-12 MED ORDER — SIMVASTATIN 20 MG PO TABS: 20.0000 mg | ORAL_TABLET | Freq: Every day | ORAL | Status: DC

## 2010-11-12 MED ORDER — GLUCOSE BLOOD VI STRP: ORAL_STRIP | Status: DC

## 2010-11-12 MED ORDER — GABAPENTIN 300 MG PO CAPS: 300.0000 mg | ORAL_CAPSULE | Freq: Three times a day (TID) | ORAL | Status: DC

## 2010-11-12 MED ORDER — HYDROCHLOROTHIAZIDE 25 MG PO TABS: 25.0000 mg | ORAL_TABLET | Freq: Every day | ORAL | Status: DC

## 2010-11-12 MED ORDER — ASPIRIN 81 MG PO TABS: 81.0000 mg | ORAL_TABLET | Freq: Every day | ORAL | Status: DC

## 2010-11-12 MED ORDER — ALENDRONATE SODIUM 70 MG PO TABS: 70.0000 mg | ORAL_TABLET | ORAL | Status: DC

## 2010-11-12 MED ORDER — BD ULTRA-FINE LANCETS MISC: Status: DC

## 2010-11-12 MED ORDER — INSULIN PEN NEEDLE 31G X 8 MM MISC: Status: DC

## 2010-11-12 MED ORDER — METOPROLOL SUCCINATE ER 50 MG PO TB24: 50.0000 mg | ORAL_TABLET | Freq: Every day | ORAL | Status: DC

## 2010-11-12 MED ORDER — INSULIN GLARGINE 100 UNIT/ML ~~LOC~~ SOLN: 24.0000 [IU] | Freq: Every day | SUBCUTANEOUS | Status: DC

## 2010-11-12 MED ORDER — LISINOPRIL 40 MG PO TABS: 40.0000 mg | ORAL_TABLET | Freq: Every day | ORAL | Status: DC

## 2010-11-12 MED ORDER — INSULIN ASPART 100 UNIT/ML ~~LOC~~ SOLN: 8.0000 [IU] | Freq: Three times a day (TID) | SUBCUTANEOUS | Status: DC

## 2010-11-12 MED ORDER — METFORMIN HCL ER (OSM) 1000 MG PO TB24: 1000.0000 mg | ORAL_TABLET | Freq: Two times a day (BID) | ORAL | Status: DC

## 2010-11-12 NOTE — Progress Notes (Signed)
  Subjective:    Patient ID: [name], female    DOB: [birth year], 65 y.o.   MRN: [id number]  HPI  Please see the A&P for the status of the pt's chronic medical problems.   Review of Systems  Constitutional: Negative for fever, chills, activity change and unexpected weight change.  HENT: Negative for congestion, rhinorrhea and postnasal drip.   Eyes: Positive for discharge. Negative for pain and itching.  Respiratory: Negative for cough, chest tightness and shortness of breath.   Cardiovascular: Negative for chest pain and leg swelling.  Gastrointestinal: Negative for nausea, vomiting, diarrhea, constipation and abdominal distention.  Genitourinary: Negative for dysuria, frequency and difficulty urinating.  Musculoskeletal: Positive for gait problem.  Skin: Negative for color change and rash.  Neurological: Positive for weakness.  Hematological: Does not bruise/bleed easily.  Psychiatric/Behavioral: Negative for sleep disturbance and dysphoric mood. The patient is not nervous/anxious.        Objective:   Physical Exam  Constitutional: She appears well-developed and well-nourished. She appears distressed.  HENT:  Head: Normocephalic and atraumatic.  Right Ear: External ear normal.  Left Ear: External ear normal.  Nose: Nose normal.  Eyes: Conjunctivae and EOM are normal. Pupils are equal, round, and reactive to light.  Cardiovascular: Normal rate and normal heart sounds.   Skin: She is not diaphoretic.          Assessment & Plan:

## 2010-11-12 NOTE — Assessment & Plan Note (Signed)
She was unaware that she had had open heart surgery / CABG. I verified the info in [facility] [place] and she did have single vessel CABG. She is on a BB which I will change from BID to QD as it is the 24 hour metoprolol. Also on an ASA and a statin. She is asymptomatic and no longer sees cards.

## 2010-11-14 NOTE — Assessment & Plan Note (Signed)
[name] had a presumed embolic stroke postoperatively from her open heart procedure. She is left with left sided weakness both upper and lower, leg greater than arm. She uses a walker in her house and also outside of her house. She requests a motorized wheelchair or scooter today. She feels that she needs this because she falls several times each year, she is not able to walk long distances which she defines as more than 10 minutes, she is unable to walk on uneven surfaces, she feels weak, she has to use her arms in order to stand.  I informed her of our motorized wheelchair policy and requested she make an appointment for evaluation if she wants to pursue this. I explained it would be her insurance that determined whether she qualifies for this equipment.  She is on an aspirin for secondary prevention.

## 2010-11-14 NOTE — Assessment & Plan Note (Signed)
BP Readings from Last 3 Encounters:  03/05/10 176/79  11/07/09 136/76  08/02/08 144/71   Her blood pressures are not yet well controlled. I am making a change in her metoprolol dose today. She is on 25 mg twice a day and since this is a long-acting medication it did not make sense to have her on twice a day dosing. I changed her to 50 mg once a day. Her heart rate is 64 and probably would not tolerate further escalation of her beta blocker. If her blood pressure remains elevated I would need to add a fifth medication out of this assumes she is 100% compliant with her current for drug regimen. I will need to review her previous vascular workup to see if she had any workup for secondary hypertension etiologies.

## 2010-11-14 NOTE — Assessment & Plan Note (Signed)
I had referred her for a repeat colonoscopy in January but she did not keep that appointment. I offered to refer her again today and she elected to defer referral. She got a flu shot today. And she got a Tdap.

## 2010-11-14 NOTE — Assessment & Plan Note (Signed)
She states that her depression is well controlled on the Paxil

## 2010-11-14 NOTE — Assessment & Plan Note (Signed)
Her last mammogram was in January 2012.

## 2010-11-14 NOTE — Assessment & Plan Note (Signed)
Her A1c today is 8.3. 8 months ago it was 8.1. She states she checks her sugars frequently. She did not bring in her log today she states her fasting sugar is usually around 180 and random sugars throughout the day are 120. She currently uses Lantus 24 units at bedtime and NovoLog 8 units q. AC. I offered her options including see me in one month with her log so that we can adjust her insulin upwards, seeing [name] with her log in adjusting her insulin, or see me in 3 months with her log for insulin adjustment. She chose to followup in 3 months.  At her next appointment she will need insulin adjustment, ophthalmology referral, and a fasting lipid panel. She does not need microalbumin as she is on an ACE inhibitor.

## 2010-11-14 NOTE — Assessment & Plan Note (Signed)
I reduced her simvastatin dose from 40 mg to 20 mg because she is also on Norvasc. In 3 months I will be able to check the fasting lipid panel and his elevated she may need a more potent statin. Her last lipid panel was 1 year ago and her triglycerides were elevated and her LDL was very well controlled.

## 2010-11-20 ENCOUNTER — Other Ambulatory Visit: Payer: Self-pay | Admitting: Internal Medicine

## 2010-11-22 ENCOUNTER — Other Ambulatory Visit: Payer: Self-pay | Admitting: Internal Medicine

## 2011-01-24 ENCOUNTER — Other Ambulatory Visit: Payer: Self-pay | Admitting: Internal Medicine

## 2011-01-24 DIAGNOSIS — I2510 Atherosclerotic heart disease of native coronary artery without angina pectoris: Secondary | ICD-10-CM

## 2011-01-24 DIAGNOSIS — I10 Essential (primary) hypertension: Secondary | ICD-10-CM

## 2011-01-24 MED ORDER — METOPROLOL SUCCINATE ER 50 MG PO TB24: 50.0000 mg | ORAL_TABLET | Freq: Every day | ORAL | Status: DC

## 2011-01-24 NOTE — Telephone Encounter (Signed)
Pls make her a Dec or Jan appt for routine F/U. Thanks

## 2011-01-25 ENCOUNTER — Inpatient Hospital Stay (HOSPITAL_COMMUNITY)
Admission: EM | Admit: 2011-01-25 | Discharge: 2011-01-28 | DRG: 195 | Disposition: A | Discharge status: Skilled Nursing Facility | Payer: Medicare Other | Attending: Internal Medicine | Admitting: Internal Medicine

## 2011-01-25 ENCOUNTER — Encounter (HOSPITAL_COMMUNITY): Payer: Self-pay

## 2011-01-25 ENCOUNTER — Emergency Department (HOSPITAL_COMMUNITY): Payer: Medicare Other

## 2011-01-25 DIAGNOSIS — E119 Type 2 diabetes mellitus without complications: Secondary | ICD-10-CM | POA: Diagnosis present

## 2011-01-25 DIAGNOSIS — J101 Influenza due to other identified influenza virus with other respiratory manifestations: Secondary | ICD-10-CM

## 2011-01-25 DIAGNOSIS — E785 Hyperlipidemia, unspecified: Secondary | ICD-10-CM

## 2011-01-25 DIAGNOSIS — Z7982 Long term (current) use of aspirin: Secondary | ICD-10-CM

## 2011-01-25 DIAGNOSIS — Z0000 Encounter for general adult medical examination without abnormal findings: Secondary | ICD-10-CM

## 2011-01-25 DIAGNOSIS — E1129 Type 2 diabetes mellitus with other diabetic kidney complication: Secondary | ICD-10-CM | POA: Diagnosis present

## 2011-01-25 DIAGNOSIS — Y998 Other external cause status: Secondary | ICD-10-CM

## 2011-01-25 DIAGNOSIS — Z853 Personal history of malignant neoplasm of breast: Secondary | ICD-10-CM

## 2011-01-25 DIAGNOSIS — I639 Cerebral infarction, unspecified: Secondary | ICD-10-CM

## 2011-01-25 DIAGNOSIS — Z8673 Personal history of transient ischemic attack (TIA), and cerebral infarction without residual deficits: Secondary | ICD-10-CM

## 2011-01-25 DIAGNOSIS — I2510 Atherosclerotic heart disease of native coronary artery without angina pectoris: Secondary | ICD-10-CM

## 2011-01-25 DIAGNOSIS — Z833 Family history of diabetes mellitus: Secondary | ICD-10-CM

## 2011-01-25 DIAGNOSIS — M810 Age-related osteoporosis without current pathological fracture: Secondary | ICD-10-CM | POA: Diagnosis present

## 2011-01-25 DIAGNOSIS — F329 Major depressive disorder, single episode, unspecified: Secondary | ICD-10-CM

## 2011-01-25 DIAGNOSIS — R296 Repeated falls: Secondary | ICD-10-CM | POA: Diagnosis present

## 2011-01-25 DIAGNOSIS — M549 Dorsalgia, unspecified: Secondary | ICD-10-CM | POA: Diagnosis present

## 2011-01-25 DIAGNOSIS — F3289 Other specified depressive episodes: Secondary | ICD-10-CM | POA: Diagnosis present

## 2011-01-25 DIAGNOSIS — I10 Essential (primary) hypertension: Secondary | ICD-10-CM | POA: Diagnosis present

## 2011-01-25 DIAGNOSIS — J09X2 Influenza due to identified novel influenza A virus with other respiratory manifestations: Principal | ICD-10-CM | POA: Diagnosis present

## 2011-01-25 LAB — CBC
HCT: 41 % (ref 36.0–46.0)
Hemoglobin: 13.9 g/dL (ref 12.0–15.0)
MCH: 31.8 pg (ref 26.0–34.0)
MCHC: 33.9 g/dL (ref 30.0–36.0)
MCV: 93.8 fL (ref 78.0–100.0)
Platelets: 219 10*3/uL (ref 150–400)
RBC: 4.37 MIL/uL (ref 3.87–5.11)
RDW: 13.2 % (ref 11.5–15.5)
WBC: 10.9 10*3/uL — ABNORMAL HIGH (ref 4.0–10.5)

## 2011-01-25 LAB — URINALYSIS, ROUTINE W REFLEX MICROSCOPIC
Bilirubin Urine: NEGATIVE
Glucose, UA: 250 mg/dL — AB
Hgb urine dipstick: NEGATIVE
Ketones, ur: NEGATIVE mg/dL
Leukocytes, UA: NEGATIVE
Nitrite: NEGATIVE
Protein, ur: 100 mg/dL — AB
Specific Gravity, Urine: 1.016 (ref 1.005–1.030)
Urobilinogen, UA: 0.2 mg/dL (ref 0.0–1.0)
pH: 5.5 (ref 5.0–8.0)

## 2011-01-25 LAB — DIFFERENTIAL
Basophils Absolute: 0 10*3/uL (ref 0.0–0.1)
Basophils Relative: 0 % (ref 0–1)
Eosinophils Absolute: 0.2 10*3/uL (ref 0.0–0.7)
Eosinophils Relative: 1 % (ref 0–5)
Lymphocytes Relative: 10 % — ABNORMAL LOW (ref 12–46)
Lymphs Abs: 1.1 10*3/uL (ref 0.7–4.0)
Monocytes Absolute: 1.1 10*3/uL — ABNORMAL HIGH (ref 0.1–1.0)
Monocytes Relative: 10 % (ref 3–12)
Neutro Abs: 8.5 10*3/uL — ABNORMAL HIGH (ref 1.7–7.7)
Neutrophils Relative %: 78 % — ABNORMAL HIGH (ref 43–77)

## 2011-01-25 LAB — COMPREHENSIVE METABOLIC PANEL
ALT: 8 U/L (ref 0–35)
AST: 10 U/L (ref 0–37)
Albumin: 3.3 g/dL — ABNORMAL LOW (ref 3.5–5.2)
Alkaline Phosphatase: 130 U/L — ABNORMAL HIGH (ref 39–117)
BUN: 13 mg/dL (ref 6–23)
CO2: 27 mEq/L (ref 19–32)
Calcium: 9.1 mg/dL (ref 8.4–10.5)
Chloride: 99 mEq/L (ref 96–112)
Creatinine, Ser: 1 mg/dL (ref 0.50–1.10)
GFR calc Af Amer: 66 mL/min — ABNORMAL LOW (ref >90)
GFR calc non Af Amer: 57 mL/min — ABNORMAL LOW (ref >90)
Glucose, Bld: 219 mg/dL — ABNORMAL HIGH (ref 70–99)
Potassium: 4 mEq/L (ref 3.5–5.1)
Sodium: 135 mEq/L (ref 135–145)
Total Bilirubin: 0.4 mg/dL (ref 0.3–1.2)
Total Protein: 7.2 g/dL (ref 6.0–8.3)

## 2011-01-25 LAB — INFLUENZA PANEL BY PCR (TYPE A & B)
H1N1 flu by pcr: NOT DETECTED
Influenza A By PCR: POSITIVE — AB
Influenza B By PCR: NEGATIVE

## 2011-01-25 LAB — GLUCOSE, CAPILLARY: Glucose-Capillary: 332 mg/dL — ABNORMAL HIGH (ref 70–99)

## 2011-01-25 LAB — PROCALCITONIN: Procalcitonin: 0.12 ng/mL

## 2011-01-25 LAB — URINE MICROSCOPIC-ADD ON

## 2011-01-25 LAB — GRAM STAIN

## 2011-01-25 LAB — LACTIC ACID, PLASMA: Lactic Acid, Venous: 1.4 mmol/L (ref 0.5–2.2)

## 2011-01-25 MED ORDER — OSELTAMIVIR PHOSPHATE 75 MG PO CAPS
75.0000 mg | ORAL_CAPSULE | Freq: Two times a day (BID) | ORAL | Status: DC
  Administered 2011-01-26 – 2011-01-28 (×6): 75 mg via ORAL
  Filled 2011-01-25 (×7): qty 1

## 2011-01-25 MED ORDER — INSULIN GLARGINE 100 UNIT/ML ~~LOC~~ SOLN
24.0000 [IU] | Freq: Every day | SUBCUTANEOUS | Status: DC
  Administered 2011-01-26 – 2011-01-27 (×3): 24 [IU] via SUBCUTANEOUS
  Filled 2011-01-25: qty 3

## 2011-01-25 MED ORDER — SODIUM CHLORIDE 0.9 % IV BOLUS (SEPSIS)
1000.0000 mL | Freq: Once | INTRAVENOUS | Status: AC
  Administered 2011-01-25: 1000 mL via INTRAVENOUS

## 2011-01-25 MED ORDER — METFORMIN HCL 500 MG PO TABS
1000.0000 mg | ORAL_TABLET | Freq: Two times a day (BID) | ORAL | Status: DC
  Administered 2011-01-26 – 2011-01-28 (×5): 1000 mg via ORAL
  Filled 2011-01-25 (×7): qty 2

## 2011-01-25 MED ORDER — SODIUM CHLORIDE 0.9 % IJ SOLN: 3.0000 mL | INTRAMUSCULAR | Status: DC | PRN

## 2011-01-25 MED ORDER — ASPIRIN EC 81 MG PO TBEC
81.0000 mg | DELAYED_RELEASE_TABLET | Freq: Every day | ORAL | Status: DC
  Administered 2011-01-26 – 2011-01-28 (×3): 81 mg via ORAL
  Filled 2011-01-25 (×3): qty 1

## 2011-01-25 MED ORDER — ACETAMINOPHEN 325 MG PO TABS
ORAL_TABLET | ORAL | Status: AC
  Filled 2011-01-25: qty 3

## 2011-01-25 MED ORDER — METOPROLOL SUCCINATE ER 50 MG PO TB24
50.0000 mg | ORAL_TABLET | Freq: Every day | ORAL | Status: DC
  Administered 2011-01-26 – 2011-01-28 (×3): 50 mg via ORAL
  Filled 2011-01-25 (×3): qty 1

## 2011-01-25 MED ORDER — ALENDRONATE SODIUM 70 MG PO TABS: 70.0000 mg | ORAL_TABLET | ORAL | Status: DC

## 2011-01-25 MED ORDER — SODIUM CHLORIDE 0.9 % IV SOLN: 250.0000 mL | INTRAVENOUS | Status: DC | PRN

## 2011-01-25 MED ORDER — GABAPENTIN 300 MG PO CAPS
300.0000 mg | ORAL_CAPSULE | Freq: Three times a day (TID) | ORAL | Status: DC
  Administered 2011-01-26 – 2011-01-28 (×7): 300 mg via ORAL
  Filled 2011-01-25 (×9): qty 1

## 2011-01-25 MED ORDER — PAROXETINE HCL 20 MG PO TABS
20.0000 mg | ORAL_TABLET | Freq: Every day | ORAL | Status: DC
  Administered 2011-01-26 – 2011-01-28 (×3): 20 mg via ORAL
  Filled 2011-01-25 (×3): qty 1

## 2011-01-25 MED ORDER — AMLODIPINE BESYLATE 10 MG PO TABS
10.0000 mg | ORAL_TABLET | Freq: Every day | ORAL | Status: DC
  Administered 2011-01-26 – 2011-01-28 (×3): 10 mg via ORAL
  Filled 2011-01-25 (×3): qty 1

## 2011-01-25 MED ORDER — ACETAMINOPHEN 325 MG PO TABS
650.0000 mg | ORAL_TABLET | Freq: Four times a day (QID) | ORAL | Status: DC | PRN
  Administered 2011-01-26 – 2011-01-27 (×3): 650 mg via ORAL
  Filled 2011-01-25 (×4): qty 2

## 2011-01-25 MED ORDER — OSELTAMIVIR PHOSPHATE 75 MG PO CAPS
75.0000 mg | ORAL_CAPSULE | Freq: Once | ORAL | Status: AC
  Administered 2011-01-25: 75 mg via ORAL
  Filled 2011-01-25: qty 1

## 2011-01-25 MED ORDER — ACETAMINOPHEN 650 MG RE SUPP: 650.0000 mg | Freq: Four times a day (QID) | RECTAL | Status: DC | PRN

## 2011-01-25 MED ORDER — SODIUM CHLORIDE 0.9 % IJ SOLN
3.0000 mL | Freq: Two times a day (BID) | INTRAMUSCULAR | Status: DC
  Administered 2011-01-25 – 2011-01-28 (×5): 3 mL via INTRAVENOUS

## 2011-01-25 MED ORDER — LISINOPRIL 40 MG PO TABS
40.0000 mg | ORAL_TABLET | Freq: Every day | ORAL | Status: DC
  Administered 2011-01-26 – 2011-01-28 (×3): 40 mg via ORAL
  Filled 2011-01-25 (×3): qty 1

## 2011-01-25 MED ORDER — SIMETHICONE 80 MG PO CHEW
80.0000 mg | CHEWABLE_TABLET | Freq: Two times a day (BID) | ORAL | Status: DC | PRN
  Administered 2011-01-26 – 2011-01-27 (×4): 80 mg via ORAL
  Filled 2011-01-25 (×4): qty 1

## 2011-01-25 MED ORDER — SIMVASTATIN 20 MG PO TABS
20.0000 mg | ORAL_TABLET | Freq: Every day | ORAL | Status: DC
  Administered 2011-01-26 – 2011-01-27 (×3): 20 mg via ORAL
  Filled 2011-01-25 (×4): qty 1

## 2011-01-25 MED ORDER — INSULIN ASPART 100 UNIT/ML ~~LOC~~ SOLN
8.0000 [IU] | Freq: Three times a day (TID) | SUBCUTANEOUS | Status: DC
  Administered 2011-01-26 – 2011-01-28 (×8): 8 [IU] via SUBCUTANEOUS
  Filled 2011-01-25: qty 3

## 2011-01-25 MED ORDER — ENOXAPARIN SODIUM 40 MG/0.4ML ~~LOC~~ SOLN
40.0000 mg | Freq: Every day | SUBCUTANEOUS | Status: DC
  Administered 2011-01-26 – 2011-01-28 (×3): 40 mg via SUBCUTANEOUS
  Filled 2011-01-25 (×3): qty 0.4

## 2011-01-25 MED ORDER — ACETAMINOPHEN 500 MG PO TABS
1000.0000 mg | ORAL_TABLET | Freq: Once | ORAL | Status: AC
  Administered 2011-01-25: 975 mg via ORAL

## 2011-01-25 MED ORDER — HYDROCHLOROTHIAZIDE 25 MG PO TABS
25.0000 mg | ORAL_TABLET | Freq: Every day | ORAL | Status: DC
  Administered 2011-01-26 – 2011-01-28 (×3): 25 mg via ORAL
  Filled 2011-01-25 (×3): qty 1

## 2011-01-25 NOTE — ED Notes (Signed)
Pt given snack and dinner tray ordered
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
## 2011-01-25 NOTE — ED Provider Notes (Signed)
History     CSN: [id number] Arrival date & time: 01/25/2011  3:09 PM   First MD Initiated Contact with Patient 01/25/11 1518      Chief Complaint  Patient presents with  . Weakness    Started Thursday, fever chills, body pain, decreased appetitie, no N/V/D,     (Consider location/radiation/quality/duration/timing/severity/associated sxs/prior treatment) The history is provided by the patient and a relative.   patient is a 65 year old female with history of CAD, CVA (ischemic stroke in 2006, mild left lower extremity residual weakness), DM 2 (insulin-dependent) who presents with generalized weakness, fatigue, malaise, and chills. Patient states a generalized weakness and other sxs started about 3 days ago.  This has been constant and progressively worsening. She has noted intermittent chills during this time but has not checked her temperature. She states that in addition to her generalized weakness she has mild increasing left lower extremity weakness. No other focal weakness. Patient normally ambulates with a walker but has had difficulty ambulating during this illness. She denies productive cough shortness of breath abdominal pain dysuria or rash. No known sick contacts. She is unsure if she got her flu shot. She states that she had a small fall from standing height 2 days ago in the setting of this diffuse weakness. She had mild back soft tissue discomfort but no other pain. She did not hit her head or hurt her neck. Patient has history of UTI but states she typically has dysuria with it. Patient also notes associated hyperglycemia during this illness. No AMS status per family and patient.  Overall severity moderate.  Past Medical History  Diagnosis Date  . CAD (coronary artery disease) 2006    Cath 5/06 50% stenosis of RCA. s/p CABG (5/06) w/ saphenous vein to RCA. Treated with statin, BB, & ASA.   Marland Kitchen Aortic stenosis, severe 2006    s/p aortic valve replacement with porcine valve 06/2004.  ECHO 2010 EF 19%, LVH, diastolic dysfxn, Bioprostetic aoritc valve, mild AS.  Marland Kitchen CVA (cerebral infarction) 2006    Post-op from AVR. Presumed embolic in nature. Carotid stenosis of R 60-79%. Repeat dopplers 4/10 no R stenosis and L stenosis of 1-29%.  . Hyperlipidemia     Mgmt with a statin  . Refusal of blood transfusions as patient is Jehovah's Witness   . Diverticulosis 2001  . Adenocarcinoma of breast 1996    Completed tamoxifen and had mastectomy.  . Osteoporosis 2006    DEXA 10/06 : L femur T -2.8, R -2.7. Lumbar T -2.4. On bisphosphonates and  Calcium / Vit D.  . Hypertension     Requires 4 drug tx  . Diabetes mellitus     Dx 04/25/1990.     Past Surgical History  Procedure Date  . Cholecystectomy   . Mastectomy 1995    L for adenocarcinoma  . Abdominal hysterectomy 1987    for fibroids  . Coronary artery bypass graft 2006    Saphenous vein to RCA  . Aortic valve replacement 2006    Family History  Problem Relation Age of Onset  . Diabetes Mother   . Hypertension Mother   . Alzheimer's disease Mother   . Heart disease Father 36    AMI at age 108 and 71  . Mental illness Sister   . Heart disease Sister 50    AMI  . Kidney disease Sister     History  Substance Use Topics  . Smoking status: Never Smoker   . Smokeless tobacco: Never  Used  . Alcohol Use: No     Occassion beer  Lives at home with daughter  OB History    Grav Para Term Preterm Abortions TAB SAB Ect Mult Living                  Review of Systems  Constitutional: Positive for chills and fatigue. Negative for fever.  HENT: Negative for facial swelling.   Eyes: Negative for visual disturbance.  Respiratory: Negative for cough, chest tightness, shortness of breath and wheezing.   Cardiovascular: Negative for chest pain and leg swelling.  Gastrointestinal: Negative for nausea, vomiting, abdominal pain and diarrhea.  Genitourinary: Negative for dysuria and difficulty urinating.  Skin: Negative for  rash.  Neurological: Positive for weakness (generalized). Negative for numbness.  Psychiatric/Behavioral: Negative for behavioral problems and confusion.  All other systems reviewed and are negative.    Allergies  Review of patient's allergies indicates no known allergies.  Home Medications   No current outpatient prescriptions on file.  BP 142/77  Pulse 82  Temp(Src) 99.7 F (37.6 C) (Oral)  Resp 22  Ht $R'5\' 7"'ME$  (1.702 m)  Wt 175 lb (79.379 kg)  BMI 27.41 kg/m2  SpO2 96%  Physical Exam  Nursing note and vitals reviewed. Constitutional: She is oriented to person, place, and time. She appears well-developed and well-nourished. No distress.  HENT:  Head: Normocephalic.  Nose: Nose normal.  Mouth/Throat: Oropharynx is clear and moist.  Eyes: EOM are normal. Pupils are equal, round, and reactive to light. No scleral icterus.  Neck: Normal range of motion. Neck supple.  Cardiovascular: Normal rate, regular rhythm and intact distal pulses.   Murmur (SEM) heard. Pulmonary/Chest: Effort normal. No respiratory distress. She has no wheezes. She has rales (mild scattered rales). She exhibits no tenderness.  Abdominal: Soft. She exhibits no distension. There is no tenderness.  Musculoskeletal: Normal range of motion. She exhibits no edema and no tenderness.       No calf TTP  Neurological: She is alert and oriented to person, place, and time. She has normal strength. No cranial nerve deficit or sensory deficit. She exhibits normal muscle tone. Coordination normal. GCS eye subscore is 4. GCS verbal subscore is 5. GCS motor subscore is 6.       5/5 strength in BUE 4/5 strength in BLE Normal sensation  Skin: Skin is warm and dry. No rash noted. She is not diaphoretic.  Psychiatric: She has a normal mood and affect. Her behavior is normal. Thought content normal.    ED Course  Procedures (including critical care time)   Date: 01/25/2011  Rate: 93  Rhythm: normal sinus rhythm  QRS  Axis: left  Intervals: QT prolonged  ST/T Wave abnormalities: nonspecific ST changes  Conduction Disutrbances:left bundle branch block  Narrative Interpretation: no acute ischemia  Old EKG Reviewed: unchanged     Labs Reviewed  CBC - Abnormal; Notable for the following:    WBC 10.9 (*)    All other components within normal limits  DIFFERENTIAL - Abnormal; Notable for the following:    Neutrophils Relative 78 (*)    Neutro Abs 8.5 (*)    Lymphocytes Relative 10 (*)    Monocytes Absolute 1.1 (*)    All other components within normal limits  COMPREHENSIVE METABOLIC PANEL - Abnormal; Notable for the following:    Glucose, Bld 219 (*)    Albumin 3.3 (*)    Alkaline Phosphatase 130 (*)    GFR calc non Af Amer 57 (*)  GFR calc Af Amer 66 (*)    All other components within normal limits  URINALYSIS, ROUTINE W REFLEX MICROSCOPIC - Abnormal; Notable for the following:    Glucose, UA 250 (*)    Protein, ur 100 (*)    All other components within normal limits  INFLUENZA PANEL BY PCR - Abnormal; Notable for the following:    Influenza A By PCR POSITIVE (*)    All other components within normal limits  GLUCOSE, CAPILLARY - Abnormal; Notable for the following:    Glucose-Capillary 332 (*)    All other components within normal limits  LACTIC ACID, PLASMA  PROCALCITONIN  GRAM STAIN  URINE MICROSCOPIC-ADD ON  URINE CULTURE  BASIC METABOLIC PANEL  CBC   Dg Chest 2 View  01/25/2011  *RADIOLOGY REPORT*  Clinical Data: Fever, chest pain, fall  CHEST - 2 VIEW  Comparison: February 09, 2006  Findings: There is mild cardiomegaly and pulmonary vascular cephalization without frank edema.  No focal infiltrates or effusions are identified.  IMPRESSION: There is mild cardiomegaly and pulmonary vascular cephalization without frank edema.  Original Report Authenticated By: Duayne Cal, M.D.   Ct Head Wo Contrast  01/25/2011  *RADIOLOGY REPORT*  Clinical Data: Weakness.  Occipital headache.  CT  HEAD WITHOUT CONTRAST  Technique:  Contiguous axial images were obtained from the base of the skull through the vertex without contrast.  Comparison: Head CT 07/08/2004.  Findings: There is mild cerebral atrophy, ventriculomegaly and periventricular white matter disease which is somewhat progressive since 2006.  No extra-axial fluid collections are seen.  No CT findings for acute hemispheric infarction or intracranial hemorrhage.  There are benign appearing bilateral basal ganglia calcifications.  The brainstem and cerebellum are unremarkable and stable.  No mass lesions.  The bony structures are intact.  No fracture or bone lesions. There is scattered ethmoid sinus disease and opacification of the right maxillary sinus.  The mastoid air cells and middle ear cavities are clear.  Globes are intact.  IMPRESSION:  1.  Progressive cerebral atrophy, ventriculomegaly and periventricular white matter disease since 2006. 2.  No acute intracranial findings or mass lesions.  Original Report Authenticated By: P. Kalman Jewels, M.D.    Diagnosis: Influenza A     MDM   Issue here with vague fatigue, myalgias, fever. She has had significant decrease in her functional status during this acute illness. Workup here is remarkable for influenza. Her urinalysis and chest x-ray are overall unremarkable. She has mild leukocytosis but otherwise her lab work is unremarkable.  Based on the significant symptoms and difficulty functioning (lives at home and ambulates with walker) medicine consult for admission. Tamiflu given in emergency department. No acute indication for antibiotics at this time. Patient hemodynamically stable without respiratory distress while in the emergency department transfer to floor in stable condition.       Fritz Pickerel 01/26/11 0212  I saw and evaluated the patient, reviewed the resident's note and I agree with the findings and plan.  Chauncy Passy, MD 01/27/11 [phone]

## 2011-01-25 NOTE — ED Notes (Signed)
Family at bedside.  Admitting MD's at bedside.  Called pharmacy to send tamiflu.

## 2011-01-25 NOTE — ED Notes (Signed)
Mask placed on the patient

## 2011-01-26 DIAGNOSIS — J09X2 Influenza due to identified novel influenza A virus with other respiratory manifestations: Secondary | ICD-10-CM

## 2011-01-26 LAB — CBC
HCT: 37.7 % (ref 36.0–46.0)
Hemoglobin: 12.5 g/dL (ref 12.0–15.0)
MCH: 30.9 pg (ref 26.0–34.0)
MCHC: 33.2 g/dL (ref 30.0–36.0)
MCV: 93.3 fL (ref 78.0–100.0)
Platelets: 184 10*3/uL (ref 150–400)
RBC: 4.04 MIL/uL (ref 3.87–5.11)
RDW: 13.5 % (ref 11.5–15.5)
WBC: 6.5 10*3/uL (ref 4.0–10.5)

## 2011-01-26 LAB — URINE CULTURE
Colony Count: NO GROWTH
Culture  Setup Time: 201212012012
Culture: NO GROWTH

## 2011-01-26 LAB — GLUCOSE, CAPILLARY
Glucose-Capillary: 230 mg/dL — ABNORMAL HIGH (ref 70–99)
Glucose-Capillary: 262 mg/dL — ABNORMAL HIGH (ref 70–99)
Glucose-Capillary: 181 mg/dL — ABNORMAL HIGH (ref 70–99)
Glucose-Capillary: 185 mg/dL — ABNORMAL HIGH (ref 70–99)

## 2011-01-26 LAB — BASIC METABOLIC PANEL
BUN: 13 mg/dL (ref 6–23)
CO2: 28 mEq/L (ref 19–32)
Calcium: 8.6 mg/dL (ref 8.4–10.5)
Chloride: 102 mEq/L (ref 96–112)
Creatinine, Ser: 0.97 mg/dL (ref 0.50–1.10)
GFR calc Af Amer: 69 mL/min — ABNORMAL LOW (ref >90)
GFR calc non Af Amer: 59 mL/min — ABNORMAL LOW (ref >90)
Glucose, Bld: 222 mg/dL — ABNORMAL HIGH (ref 70–99)
Potassium: 3.5 mEq/L (ref 3.5–5.1)
Sodium: 135 mEq/L (ref 135–145)

## 2011-01-26 MED ORDER — TRAMADOL HCL 50 MG PO TABS
50.0000 mg | ORAL_TABLET | Freq: Four times a day (QID) | ORAL | Status: DC | PRN
  Administered 2011-01-26 – 2011-01-28 (×5): 50 mg via ORAL
  Filled 2011-01-26 (×5): qty 1

## 2011-01-26 NOTE — Progress Notes (Signed)
Subjective: Pt complains of back pain since fall two days ago, denies sob, cp, and headaches, reports some improvement in overall weakness  Objective: Vital signs in last 24 hours: T 100, P76, R20, Bp 124/67, o2 sat 92%  Filed Vitals:   01/25/11 1838 01/25/11 2137 01/25/11 2207 01/26/11 0529  BP: 147/74 124/71 142/77 124/67  Pulse: 81 81 82 76  Temp: 99.8 F (37.7 C)  99.7 F (37.6 C) 100 F (37.8 C)  TempSrc: Oral  Oral Oral  Resp: $Remo'17 22 22 20  'QUTPg$ Height:      Weight:      SpO2: 95% 96% 96% 92%   Weight change:   Intake/Output Summary (Last 24 hours) at 01/26/11 0949 Last data filed at 01/26/11 0500  Gross per 24 hour  Intake      0 ml  Output   1700 ml  Net  -1700 ml   Physical Exam:  General: Well-developed, well-nourished, elderly Black woman in no acute distress; alert, appropriate and cooperative throughout examination. HEENT: normocephalic, atraumatic Lungs: Normal respiratory effort. Clear to auscultation BL without crackles or wheezes. Heart: RRR. S1 and S2 normal, +SEM right upper sternal border Abdomen: obese, BS normoactive. Soft, some tenderness to palpation along right flank MSK: tender to deep palpation right lower back and right ischial spine, no tenderness on palpation of right trochanter, trace pretibial edema. Neurologic: non-focal, strength and sensation grossly intact    Lab Results: Basic Metabolic Panel:  Lab 96/78/93 0600 01/25/11 1606  NA 135 135  K 3.5 4.0  CL 102 99  CO2 28 27  GLUCOSE 222* 219*  BUN 13 13  CREATININE 0.97 1.00  CALCIUM 8.6 9.1  MG -- --  PHOS -- --   Liver Function Tests:  Lab 01/25/11 1606  AST 10  ALT 8  ALKPHOS 130*  BILITOT 0.4  PROT 7.2  ALBUMIN 3.3*     CBC:  Lab 01/26/11 0600 01/25/11 1606  WBC 6.5 10.9*  NEUTROABS -- 8.5*  HGB 12.5 13.9  HCT 37.7 41.0  MCV 93.3 93.8  PLT 184 219      CBG:  Lab 01/26/11 0644 01/25/11 2303  GLUCAP 230* 332*       Micro Results: Recent Results  (from the past 240 hour(s))  GRAM STAIN     Status: Normal   Collection Time   01/25/11  5:26 PM      Component Value Range Status Comment   Specimen Description URINE, CATHETERIZED   Final    Special Requests NONE   Final    Gram Stain     Final    Value: CYTOSPIN     NO WBC SEEN     NEGAIVE FOR BACTERIA     EPITHELIALS PRESENT   Report Status 01/25/2011 FINAL   Final    Studies/Results: Dg Chest 2 View  01/25/2011  *RADIOLOGY REPORT*  Clinical Data: Fever, chest pain, fall  CHEST - 2 VIEW  Comparison: February 09, 2006  Findings: There is mild cardiomegaly and pulmonary vascular cephalization without frank edema.  No focal infiltrates or effusions are identified.  IMPRESSION: There is mild cardiomegaly and pulmonary vascular cephalization without frank edema.  Original Report Authenticated By: Duayne Cal, M.D.   Ct Head Wo Contrast  01/25/2011  *RADIOLOGY REPORT*  Clinical Data: Weakness.  Occipital headache.  CT HEAD WITHOUT CONTRAST  Technique:  Contiguous axial images were obtained from the base of the skull through the vertex without contrast.  Comparison: Head CT  07/08/2004.  Findings: There is mild cerebral atrophy, ventriculomegaly and periventricular white matter disease which is somewhat progressive since 2006.  No extra-axial fluid collections are seen.  No CT findings for acute hemispheric infarction or intracranial hemorrhage.  There are benign appearing bilateral basal ganglia calcifications.  The brainstem and cerebellum are unremarkable and stable.  No mass lesions.  The bony structures are intact.  No fracture or bone lesions. There is scattered ethmoid sinus disease and opacification of the right maxillary sinus.  The mastoid air cells and middle ear cavities are clear.  Globes are intact.  IMPRESSION:  1.  Progressive cerebral atrophy, ventriculomegaly and periventricular white matter disease since 2006. 2.  No acute intracranial findings or mass lesions.  Original Report  Authenticated By: P. Kalman Jewels, M.D.   Medications:  Scheduled Meds:   . acetaminophen  1,000 mg Oral Once  . amLODipine  10 mg Oral Daily  . aspirin EC  81 mg Oral Daily  . enoxaparin  40 mg Subcutaneous Daily  . gabapentin  300 mg Oral TID  . hydrochlorothiazide  25 mg Oral Daily  . insulin aspart  8 Units Subcutaneous TID AC  . insulin glargine  24 Units Subcutaneous QHS  . lisinopril  40 mg Oral Daily  . metFORMIN  1,000 mg Oral BID WC  . metoprolol  50 mg Oral Daily  . oseltamivir  75 mg Oral Once  . oseltamivir  75 mg Oral BID  . PARoxetine  20 mg Oral Daily  . simvastatin  20 mg Oral QHS  . sodium chloride  1,000 mL Intravenous Once  . sodium chloride  3 mL Intravenous Q12H  . DISCONTD: alendronate  70 mg Oral Q7 days   Continuous Infusions:  PRN Meds:.sodium chloride, acetaminophen, acetaminophen, simethicone, sodium chloride, traMADol  Assessment/Plan:  65 yo female with generalized weakness, headaches and fever admitted with influenza. 1. Influenza: positive Influenza A on pcr, likely cause of her recent generalized weakness, headaches and fever Plan:  -will continue Tamiflu -Tylenol for fever  2. DIABETES MELLITUS II: elevated cbg's likely elevated to acute illness Plan:  -Will continue to monitor -will continue home regimen of Lantus 24 units qhs, metformin 1000 mg bid with meals and insulin aspart 8 units tid with meals  3. Hypertension: adequate control with systolic bp in 892'J Plan: - Will continue current regimen of amlodipine 10 mg qd, metoprolol 50 mg qd and HCTZ 25 mg qd   4. Right sided lower back pain: recent fall 2 days ago, given h/o of osteoporosis with DEXA  -2.8, concerning for possible acute pathology PLAN -consider XRay right hip -continue Alendronate outpt regimen -Tramadol for pain  5. Disposition: lives with grandaughter, need further assessment of family ability to care for pt PLAN -Will get PT/OT assessment for functional  status. -will have SW/Case manager assess home needs     LOS: 1 day   [name] 01/26/2011, 9:49 AM

## 2011-01-26 NOTE — Progress Notes (Signed)
PHARMACIST - PHYSICIAN COMMUNICATION DR:       CONCERNING: P&T Medication Policy Regarding Alendronate (Fosamax), Ibandronate (Boniva), and Risedronate (Actonel)  DESCRIPTION:  Alendronate (Fosamax), ibandronate (Boniva), and risedronate (Actonel) can cause severe esophageal erosions in patients who are unable to remain upright at least 30 minutes after taking this medication. Since brief interruptions in therapy are thought to have minimal impact on bone mineral density, the [facility] has established that alendronate, ibandronate, or risedronate orders should be routinely discontinued during hospitalization. To override this safety policy and permit administration of Boniva, Fosamax, or Actonel in the hospital, prescribers must write "DO NOT HOLD" when ordering this class of medications.  RECOMMENDATION: Your order for alendronate (Fosamax), ibandronate (Boniva), or risedronate (Actonel) has been discontinued at this time. If the patient's post-hospital medical condition warrants safe use of this class of drugs, please resume the pre-hospital regimen upon discharge.  Caryl Pina 01/26/2011

## 2011-01-26 NOTE — H&P (Signed)
Internal Medicine Attending Admission Note Date: 01/26/2011  Patient name: [name] Medical record number: [id number] Date of birth: [birth year] Age: 65 y.o. Gender: female  I saw and evaluated the patient. I reviewed the resident's note and I agree with the resident's findings and plan as documented in the resident's note with the revisions and addendums below.  Chief Complaint(s):  Fevers, weakness, and malaise  History - key components related to admission:  [name] is a 65 year old woman with a history of coronary artery disease, aortic stenosis status post porcine valve replacement, cerebral infarction after her valve replacement surgery, breast cancer status post left mastectomy, diabetes, hypertension, and hyperlipidemia who presents with a three-day history of weakness, fevers, and generalized malaise. This was associated with a loss of appetite and sweats. She is unsure if she got the flu shot this year. One family member she lives with has been ill recently. She was unable to get up when sitting in bed and her family called EMS to have her brought to the [facility] ED for further evaluation. In the emergency department she was PCR positive for influenza A. Because of her generalized weakness, her family felt she could not be cared for at home at this time. She was therefore admitted for further evaluation.  Physical Exam - key components related to admission:  Filed Vitals:   01/25/11 1838 01/25/11 2137 01/25/11 2207 01/26/11 0529  BP: 147/74 124/71 142/77 124/67  Pulse: 81 81 82 76  Temp: 99.8 F (37.7 C)  99.7 F (37.6 C) 100 F (37.8 C)  TempSrc: Oral  Oral Oral  Resp: $Remo'17 22 22 20  'oKCPi$ Height:      Weight:      SpO2: 95% 96% 96% 92%   General: Well-developed, well-nourished woman moist with sweat lying comfortably in bed in no acute distress. Lungs: Clear to auscultation bilaterally without wheezes, rhonchi, or rales. Heart: Regular rate and rhythm, without appreciated  murmurs rubs or gallops although her heart sounds were distant. Abdomen: Soft, non tender, active bowel sounds, without masses or hepatosplenomegaly. Extremities: Without edema. Back: Diffuse right lower flank pain without bruises or overlying rash. No point tenderness. Neuro: Her strength is 5 over 5 throughout without any focal weakness. Her sensation is grossly intact.  Lab results:  Basic Metabolic Panel:  Basename 01/26/11 0600 01/25/11 1606  NA 135 135  K 3.5 4.0  CL 102 99  CO2 28 27  GLUCOSE 222* 219*  BUN 13 13  CREATININE 0.97 1.00  CALCIUM 8.6 9.1  MG -- --  PHOS -- --   Liver Function Tests:  Basename 01/25/11 1606  AST 10  ALT 8  ALKPHOS 130*  BILITOT 0.4  PROT 7.2  ALBUMIN 3.3*   CBC:  Basename 01/26/11 0600 01/25/11 1606  WBC 6.5 10.9*  NEUTROABS -- 8.5*  HGB 12.5 13.9  HCT 37.7 41.0  MCV 93.3 93.8  PLT 184 219   CBG:  Basename 01/26/11 1154 01/26/11 0644 01/25/11 2303  GLUCAP 262* 230* 332*   Miscellaneous tests:  Influenza A. PCR positive  Imaging results:  Dg Chest 2 View  01/25/2011  *RADIOLOGY REPORT*  Clinical Data: Fever, chest pain, fall  CHEST - 2 VIEW  Comparison: February 09, 2006  Findings: There is mild cardiomegaly and pulmonary vascular cephalization without frank edema.  No focal infiltrates or effusions are identified.  IMPRESSION: There is mild cardiomegaly and pulmonary vascular cephalization without frank edema.  Original Report Authenticated By: Duayne Cal, M.D.  Ct Head Wo Contrast  01/25/2011  *RADIOLOGY REPORT*  Clinical Data: Weakness.  Occipital headache.  CT HEAD WITHOUT CONTRAST  Technique:  Contiguous axial images were obtained from the base of the skull through the vertex without contrast.  Comparison: Head CT 07/08/2004.  Findings: There is mild cerebral atrophy, ventriculomegaly and periventricular white matter disease which is somewhat progressive since 2006.  No extra-axial fluid collections are seen.  No CT  findings for acute hemispheric infarction or intracranial hemorrhage.  There are benign appearing bilateral basal ganglia calcifications.  The brainstem and cerebellum are unremarkable and stable.  No mass lesions.  The bony structures are intact.  No fracture or bone lesions. There is scattered ethmoid sinus disease and opacification of the right maxillary sinus.  The mastoid air cells and middle ear cavities are clear.  Globes are intact.  IMPRESSION:  1.  Progressive cerebral atrophy, ventriculomegaly and periventricular white matter disease since 2006. 2.  No acute intracranial findings or mass lesions.  Original Report Authenticated By: P. Kalman Jewels, M.D.   Assessment & Plan by Problem:  [name] is a 65 year old woman with multiple medical problems who presents with weakness, fever, and generalized malaise x 3 days. She's tested positive for influenza A. Therefore, the likely cause of her symptoms are related to influenza.  She reports a fall one week ago at which time she developed back pain. Given the neurologic exam, it is unlikely she sustained any neurologic damage. In her pain is musculoskeletal in nature.  1) influenza A.: Will start Tamiflu in hopes of shortening her symptomatic influenza.  We will also treat fevers with Tylenol and muscle aches with Tylenol or nonsteroidals.  2) generalized weakness: We'll ask physical therapy to evaluate [name] and assess her ability to ambulate safely at home. If she requires assistance, we will try to get that arranged either with home health, or a short SNF stay.  3) disposition: We will await physical therapy's evaluation. If she is felt safe to return home without any assistance she will likely be discharged in the morning. If she requires assistance that will be arranged and disposition will depend upon those needs.

## 2011-01-27 ENCOUNTER — Inpatient Hospital Stay (HOSPITAL_COMMUNITY): Payer: Medicare Other

## 2011-01-27 DIAGNOSIS — E119 Type 2 diabetes mellitus without complications: Secondary | ICD-10-CM

## 2011-01-27 LAB — GLUCOSE, CAPILLARY
Glucose-Capillary: 155 mg/dL — ABNORMAL HIGH (ref 70–99)
Glucose-Capillary: 207 mg/dL — ABNORMAL HIGH (ref 70–99)
Glucose-Capillary: 123 mg/dL — ABNORMAL HIGH (ref 70–99)
Glucose-Capillary: 125 mg/dL — ABNORMAL HIGH (ref 70–99)

## 2011-01-27 MED ORDER — OSELTAMIVIR PHOSPHATE 75 MG PO CAPS: 75.0000 mg | ORAL_CAPSULE | Freq: Two times a day (BID) | ORAL | Status: DC

## 2011-01-27 MED ORDER — TRAMADOL HCL 50 MG PO TABS: 50.0000 mg | ORAL_TABLET | Freq: Four times a day (QID) | ORAL | Status: AC | PRN

## 2011-01-27 MED ORDER — ACETAMINOPHEN 325 MG PO TABS: 650.0000 mg | ORAL_TABLET | Freq: Four times a day (QID) | ORAL | Status: AC | PRN

## 2011-01-27 MED ORDER — PANTOPRAZOLE SODIUM 40 MG PO TBEC
40.0000 mg | DELAYED_RELEASE_TABLET | Freq: Every day | ORAL | Status: DC
  Administered 2011-01-27 – 2011-01-28 (×2): 40 mg via ORAL
  Filled 2011-01-27 (×2): qty 1

## 2011-01-27 NOTE — Progress Notes (Signed)
Clinical Social Work-Please see shadow chart for full assessment-pt and dtr choose [facility] are first choice-CSW initiated FL2 and bed search and will follow up with offers in the [name]-MSW, [phone]

## 2011-01-27 NOTE — Progress Notes (Signed)
Physical Therapy Evaluation Patient Details Name: [name] MRN: [id number] DOB: 1945/08/31 Today's Date: 01/27/2011  Problem List:  Patient Active Problem List  Diagnoses  . DIABETES MELLITUS, TYPE II  . DEPRESSION  . Hyperlipidemia  . Refusal of blood transfusions as patient is Jehovah's Witness  . Adenocarcinoma of breast  . Osteoporosis  . CVA (cerebral infarction)  . Aortic stenosis, severe  . CAD (coronary artery disease)  . Hypertension  . Routine health maintenance  . Influenza A    Past Medical History:  Past Medical History  Diagnosis Date  . CAD (coronary artery disease) 2006    Cath 5/06 50% stenosis of RCA. s/p CABG (5/06) w/ saphenous vein to RCA. Treated with statin, BB, & ASA.   Marland Kitchen Aortic stenosis, severe 2006    s/p aortic valve replacement with porcine valve 06/2004. ECHO 2010 EF 27%, LVH, diastolic dysfxn, Bioprostetic aoritc valve, mild AS.  Marland Kitchen CVA (cerebral infarction) 2006    Post-op from AVR. Presumed embolic in nature. Carotid stenosis of R 60-79%. Repeat dopplers 4/10 no R stenosis and L stenosis of 1-29%.  . Hyperlipidemia     Mgmt with a statin  . Refusal of blood transfusions as patient is Jehovah's Witness   . Diverticulosis 2001  . Adenocarcinoma of breast 1996    Completed tamoxifen and had mastectomy.  . Osteoporosis 2006    DEXA 10/06 : L femur T -2.8, R -2.7. Lumbar T -2.4. On bisphosphonates and  Calcium / Vit D.  . Hypertension     Requires 4 drug tx  . Diabetes mellitus     Dx 04/25/1990.    Past Surgical History:  Past Surgical History  Procedure Date  . Cholecystectomy   . Mastectomy 1995    L for adenocarcinoma  . Abdominal hysterectomy 1987    for fibroids  . Coronary artery bypass graft 2006    Saphenous vein to RCA  . Aortic valve replacement 2006    PT Assessment/Plan/Recommendation PT Assessment Clinical Impression Statement: Pt presents with a diagnosis of influenza along with weakness secondary to influenza. Pt  will benefit from skilled PT in the acute care setting in order to maximize functional strength for a safe d/c home. PT Recommendation/Assessment: Patient will need skilled PT in the acute care venue PT Problem List: Decreased strength;Decreased activity tolerance;Decreased mobility;Decreased knowledge of use of DME;Pain Barriers to Discharge: Decreased caregiver support PT Therapy Diagnosis : Generalized weakness PT Plan PT Frequency: Min 2X/week PT Treatment/Interventions: DME instruction;Gait training;Stair training;Functional mobility training;Therapeutic exercise;Patient/family education PT Recommendation Follow Up Recommendations: Home health PT;24 hour supervision/assistance (supervision for safety secondary to increased falls) Equipment Recommended: None recommended by PT PT Goals  Acute Rehab PT Goals PT Goal Formulation: With patient Time For Goal Achievement: 2 weeks Pt will go Supine/Side to Sit: with modified independence PT Goal: Supine/Side to Sit - Progress: Progressing toward goal Pt will go Sit to Supine/Side: with modified independence PT Goal: Sit to Supine/Side - Progress: Progressing toward goal Pt will go Sit to Stand: with modified independence PT Goal: Sit to Stand - Progress: Progressing toward goal Pt will go Stand to Sit: with modified independence PT Goal: Stand to Sit - Progress: Progressing toward goal Pt will Transfer Bed to Chair/Chair to Bed: with supervision PT Transfer Goal: Bed to Chair/Chair to Bed - Progress: Progressing toward goal Pt will Ambulate: >150 feet;with supervision;with rolling walker PT Goal: Ambulate - Progress: Progressing toward goal Pt will Go Up / Down Stairs: 1-2 stairs;with  supervision;with least restrictive assistive device PT Goal: Up/Down Stairs - Progress: Progressing toward goal  PT Evaluation Precautions/Restrictions  Precautions Precautions: Fall Restrictions Weight Bearing Restrictions: No Prior Functioning  Home  Living Lives With: Daughter Receives Help From: Family (intermittent assitance) Type of Home: Apartment Home Layout: Two level Alternate Level Stairs-Rails: Right Alternate Level Stairs-Number of Steps: 13 Home Access: Stairs to enter Entrance Stairs-Rails: None Entrance Stairs-Number of Steps: 1 Bathroom Shower/Tub: Product/process development scientist: Standard Bathroom Accessibility: Yes How Accessible: Accessible via walker Home Adaptive Equipment: Walker - rolling;Straight cane Prior Function Level of Independence: Independent with gait;Needs assistance with tranfers;Independent with basic ADLs (needs assist getting in/out of tub; uses RW) Able to Take Stairs?: Yes Driving: No Vocation: Retired Artist: Awake/alert Overall Cognitive Status: Appears within functional limits for tasks assessed Sensation/Coordination Sensation Light Touch: Appears Intact Extremity Assessment LLE Assessment LLE Assessment: Exceptions to WFL LLE AROM (degrees) Overall AROM Left Lower Extremity: Within functional limits for tasks assessed LLE Strength LLE Overall Strength: Deficits;Due to premorbid status (>/= 4/5. ) Mobility (including Balance) Bed Mobility Bed Mobility: Yes Supine to Sit: 4: Min assist;HOB elevated (Comment degrees);With rails (35) Supine to Sit Details (indicate cue type and reason): VC for sequencing secondary to LLE weakness Transfers Transfers: Yes Sit to Stand: 4: Min assist;From bed;With upper extremity assist Sit to Stand Details (indicate cue type and reason): VC for hand placement to safety to RW Stand to Sit: 4: Min assist Stand to Sit Details: VC for hand placement. Controlled descent Ambulation/Gait Ambulation/Gait: Yes Ambulation/Gait Assistance: 4: Min assist (Minguard assist) Ambulation/Gait Assistance Details (indicate cue type and reason): VC for proper sequencing and increased knee flexion of LLE. Ambulation Distance  (Feet): 150 Feet Assistive device: Rolling walker Gait Pattern: Step-to pattern;Decreased hip/knee flexion - left;Decreased step length - left;Decreased dorsiflexion - left;Trunk flexed Gait velocity: Decreased gait speed Stairs: No    Exercise    End of Session PT - End of Session Equipment Utilized During Treatment: Gait belt Activity Tolerance: Patient limited by fatigue Patient left: in chair;with call bell in reach Nurse Communication: Mobility status for transfers;Mobility status for ambulation General Behavior During Session: [facility] for tasks performed Cognition: [facility] for tasks performed  Ambrose Finland 01/27/2011, 11:43 AM  01/27/2011 Ambrose Finland DPT PAGER: [phone] OFFICE: [phone]

## 2011-01-27 NOTE — Progress Notes (Signed)
Subjective: Afebrile, c/o 10/10 pain right side lower back, c/o indigestion  Objective: Vital signs in last 24 hours: T 98.1, P59, R 22, Bp 12665, O2 sat 99%  Filed Vitals:   01/26/11 0529 01/26/11 1400 01/26/11 2324 01/27/11 0700  BP: 124/67 154/85 140/78 126/65  Pulse: 76 72 80 59  Temp: 100 F (37.8 C) 98.8 F (37.1 C) 99 F (37.2 C) 98.1 F (36.7 C)  TempSrc: Oral Oral Oral Oral  Resp: $Remo'20 18 22 22  'CSqhs$ Height:      Weight:      SpO2: 92% 98% 99% 99%   Weight change:   Intake/Output Summary (Last 24 hours) at 01/27/11 0724 Last data filed at 01/26/11 1700  Gross per 24 hour  Intake    275 ml  Output    550 ml  Net   -275 ml   Physical Exam: General: Vital signs reviewed and noted. Well-developed, well-nourished, in no acute distress; alert, appropriate and cooperative throughout examination. HEENT: Normocephalic, atraumatic. Lungs: Normal respiratory effort. Clear to auscultation BL without crackles or wheezes. Heart: RRR. S1 and S2 normal with 3/6 SEM at left upper sternal border Abdomen: BS normoactive. Soft, Nondistended, non-tender. No masses or organomegaly.   Lab Results: Basic Metabolic Panel:  Lab 12/27/13 0600 01/25/11 1606  NA 135 135  K 3.5 4.0  CL 102 99  CO2 28 27  GLUCOSE 222* 219*  BUN 13 13  CREATININE 0.97 1.00  CALCIUM 8.6 9.1  MG -- --  PHOS -- --   Liver Function Tests:  Lab 01/25/11 1606  AST 10  ALT 8  ALKPHOS 130*  BILITOT 0.4  PROT 7.2  ALBUMIN 3.3*    CBC:  Lab 01/26/11 0600 01/25/11 1606  WBC 6.5 10.9*  NEUTROABS -- 8.5*  HGB 12.5 13.9  HCT 37.7 41.0  MCV 93.3 93.8  PLT 184 219     CBG:  Lab 01/27/11 0631 01/26/11 2120 01/26/11 1547 01/26/11 1154 01/26/11 0644 01/25/11 2303  GLUCAP 155* 185* 181* 262* 230* 332*     Micro Results: Recent Results (from the past 240 hour(s))  URINE CULTURE     Status: Normal   Collection Time   01/25/11  5:26 PM      Component Value Range Status Comment   Specimen  Description URINE, CATHETERIZED   Final    Special Requests NONE   Final    Setup Time [id number]   Final    Colony Count NO GROWTH   Final    Culture NO GROWTH   Final    Report Status 01/26/2011 FINAL   Final   GRAM STAIN     Status: Normal   Collection Time   01/25/11  5:26 PM      Component Value Range Status Comment   Specimen Description URINE, CATHETERIZED   Final    Special Requests NONE   Final    Gram Stain     Final    Value: CYTOSPIN     NO WBC SEEN     NEGAIVE FOR BACTERIA     EPITHELIALS PRESENT   Report Status 01/25/2011 FINAL   Final    Studies/Results: Dg Chest 2 View  01/25/2011  *RADIOLOGY REPORT*  Clinical Data: Fever, chest pain, fall  CHEST - 2 VIEW  Comparison: February 09, 2006  Findings: There is mild cardiomegaly and pulmonary vascular cephalization without frank edema.  No focal infiltrates or effusions are identified.  IMPRESSION: There is mild cardiomegaly and pulmonary vascular  cephalization without frank edema.  Original Report Authenticated By: Duayne Cal, M.D.   Ct Head Wo Contrast  01/25/2011  *RADIOLOGY REPORT*  Clinical Data: Weakness.  Occipital headache.  CT HEAD WITHOUT CONTRAST  Technique:  Contiguous axial images were obtained from the base of the skull through the vertex without contrast.  Comparison: Head CT 07/08/2004.  Findings: There is mild cerebral atrophy, ventriculomegaly and periventricular white matter disease which is somewhat progressive since 2006.  No extra-axial fluid collections are seen.  No CT findings for acute hemispheric infarction or intracranial hemorrhage.  There are benign appearing bilateral basal ganglia calcifications.  The brainstem and cerebellum are unremarkable and stable.  No mass lesions.  The bony structures are intact.  No fracture or bone lesions. There is scattered ethmoid sinus disease and opacification of the right maxillary sinus.  The mastoid air cells and middle ear cavities are clear.  Globes are  intact.  IMPRESSION:  1.  Progressive cerebral atrophy, ventriculomegaly and periventricular white matter disease since 2006. 2.  No acute intracranial findings or mass lesions.  Original Report Authenticated By: P. Kalman Jewels, M.D.   Medications: Scheduled Meds:   . amLODipine  10 mg Oral Daily  . aspirin EC  81 mg Oral Daily  . enoxaparin  40 mg Subcutaneous Daily  . gabapentin  300 mg Oral TID  . hydrochlorothiazide  25 mg Oral Daily  . insulin aspart  8 Units Subcutaneous TID AC  . insulin glargine  24 Units Subcutaneous QHS  . lisinopril  40 mg Oral Daily  . metFORMIN  1,000 mg Oral BID WC  . metoprolol  50 mg Oral Daily  . oseltamivir  75 mg Oral BID  . PARoxetine  20 mg Oral Daily  . simvastatin  20 mg Oral QHS  . sodium chloride  3 mL Intravenous Q12H   Continuous Infusions:  PRN Meds:.sodium chloride, acetaminophen, acetaminophen, simethicone, sodium chloride, traMADol Assessment/Plan: Principal Problem:  *Influenza A Active Problems:  DIABETES MELLITUS, TYPE II  Hypertension  Assessment/Plan:  65 yo female with generalized weakness, headaches and fever admitted with influenza. 1. Influenza: positive Influenza A on pcr, likely cause of her recent generalized weakness, headaches and fever Plan:  -will continue Tamiflu  -Tylenol for fever   2. DIABETES MELLITUS II: elevated cbg's likely elevated to acute illness Plan:  -Will continue to monitor  -will continue home regimen of Lantus 24 units qhs, metformin 1000 mg bid with meals and insulin aspart 8 units tid with meals   3. Hypertension: adequate control with systolic bp in 762'G Plan:  - Will continue current regimen of amlodipine 10 mg qd, metoprolol 50 mg qd, and HCTZ 25 mg qd   4. Right sided lower back pain: recent fall 2 days ago, given h/o of osteoporosis with DEXA -2.8, concerning for possible acute pathology  PLAN  -XRay right hip today -continue Alendronate outpt regimen  -Tramadol for pain    5. Disposition: lives with daughter in 2 story apartment, need further assessment of family ability to care for pt and home care needs when no one is with her during the day  PLAN  -Will get PT/OT assessment for functional status.  -will have SW/Case manager assess home needs    LOS: 2 days   [name] 01/27/2011, 7:24 AM

## 2011-01-27 NOTE — Discharge Summary (Signed)
[facility] Discharge Note  Name: [name] MRN: [id number] DOB: 19-Dec-1945 65 y.o.  Date of Admission: 01/25/2011  3:09 PM Date of Discharge: 01/28/2011 Attending Physician: Scharlene Gloss, MD  Discharge Diagnosis: Principal Problem:  *Influenza A Active Problems:  DIABETES MELLITUS, TYPE II  Hypertension  Hip pain, right   Discharge Medications: Current Discharge Medication List    START taking these medications   Details  acetaminophen (TYLENOL) 325 MG tablet Take 2 tablets (650 mg total) by mouth every 6 (six) hours as needed (or Fever >/= 101). Qty: 30 tablet, Refills: 3    oseltamivir (TAMIFLU) 75 MG capsule Take 1 capsule (75 mg total) by mouth 2 (two) times daily. Qty: 2 capsule, Refills: 0    traMADol (ULTRAM) 50 MG tablet Take 1 tablet (50 mg total) by mouth every 6 (six) hours as needed. Maximum dose= 8 tablets per day Qty: 30 tablet, Refills: 1      CONTINUE these medications which have NOT CHANGED   Details  alendronate (FOSAMAX) 70 MG tablet Take 70 mg by mouth every 7 (seven) days. Take with a full glass of water on an empty stomach. Takes on Sundays     amLODipine (NORVASC) 10 MG tablet Take 1 tablet (10 mg total) by mouth daily. Qty: 30 tablet, Refills: 11   Associated Diagnoses: Hypertension    aspirin 81 MG tablet Take 81 mg by mouth daily.      gabapentin (NEURONTIN) 300 MG capsule Take 1 capsule (300 mg total) by mouth 3 (three) times daily. Qty: 90 capsule, Refills: 11   Associated Diagnoses: Type II or unspecified type diabetes mellitus without mention of complication, not stated as uncontrolled    hydrochlorothiazide (HYDRODIURIL) 25 MG tablet Take 1 tablet (25 mg total) by mouth daily. Qty: 30 tablet, Refills: 11   Associated Diagnoses: Hypertension    insulin aspart (NOVOLOG) 100 UNIT/ML injection Inject 8 Units into the skin 3 (three) times daily before meals. Inject 8 units before breakfast and lunch and  dinner     insulin glargine (LANTUS SOLOSTAR) 100 UNIT/ML injection Inject 24 Units into the skin at bedtime. Qty: 9 mL, Refills: 11   Associated Diagnoses: Type II or unspecified type diabetes mellitus without mention of complication, not stated as uncontrolled    lisinopril (PRINIVIL,ZESTRIL) 40 MG tablet Take 1 tablet (40 mg total) by mouth daily. Qty: 30 tablet, Refills: 11   Associated Diagnoses: Hypertension    metFORMIN (GLUCOPHAGE) 1000 MG tablet Take 1 tablet (1,000 mg total) by mouth 2 (two) times daily with a meal. Qty: 64 tablet, Refills: 6    metoprolol (TOPROL-XL) 50 MG 24 hr tablet Take 1 tablet (50 mg total) by mouth daily. Qty: 30 tablet, Refills: 11   Associated Diagnoses: CAD (coronary artery disease); Hypertension    PARoxetine (PAXIL) 20 MG tablet Take 1 tablet (20 mg total) by mouth every morning. Qty: 30 tablet, Refills: 11   Associated Diagnoses: Depressive disorder, not elsewhere classified    simvastatin (ZOCOR) 20 MG tablet Take 1 tablet (20 mg total) by mouth at bedtime. Qty: 30 tablet, Refills: 11   Associated Diagnoses: Type II or unspecified type diabetes mellitus without mention of complication, not stated as uncontrolled; CAD (coronary artery disease); CVA (cerebral infarction); Hyperlipidemia        Disposition and follow-up:   Ms.Sharnell C Mayer was discharged from [facility] in Stable condition.     Discharge Orders    Future Appointments: Provider:  Department: Dept Phone: Center:   02/04/2011 9:15 AM Trish Fountain [facility] [phone] [facility]     Future Orders Please Complete By Expires   Diet - low sodium heart healthy      Diet - low sodium heart healthy      Increase activity slowly      Discharge instructions      Comments:   Take flu medicine for 2 more days. Continue to take your other medications as prescribed. Keep follow-up appointment with Dr. Obie Dredge (Dr. Lynnae January) in the Internal Medicine Clinic  on Feb 04, 2011 at 9:15 am.   Call MD for:  temperature >100.4      Call MD for:  persistant nausea and vomiting      Call MD for:  severe uncontrolled pain      Call MD for:  extreme fatigue      Call MD for:  persistant dizziness or light-headedness      Call MD for:  difficulty breathing, headache or visual disturbances      Discharge instructions      Scheduling Instructions:   Follow up with Internal Medicine clinic February 04, 2011 at 9:15.   Increase activity slowly      Discharge instructions      Comments:   Follow up with Internal Medicine Clinic February 04, 2011 at 9:15.      Consultations:   Physical Therapy Social Worker Case Manager  Procedures Performed:  Dg Chest 2 View  01/25/2011  *RADIOLOGY REPORT*  Clinical Data: Fever, chest pain, fall  CHEST - 2 VIEW  Comparison: February 09, 2006  Findings: There is mild cardiomegaly and pulmonary vascular cephalization without frank edema.  No focal infiltrates or effusions are identified.  IMPRESSION: There is mild cardiomegaly and pulmonary vascular cephalization without frank edema.  Original Report Authenticated By: Duayne Cal, M.D.   Dg Hip Complete Right  01/27/2011  *RADIOLOGY REPORT*  Clinical Data: Right hip pain secondary to a recent fall.  RIGHT HIP - COMPLETE 2+ VIEW  Comparison: None.  Findings: No fracture, dislocation, or other significant abnormality.  IMPRESSION: Normal right hip.  Original Report Authenticated By: Larey Seat, M.D.   Ct Head Wo Contrast  01/25/2011  *RADIOLOGY REPORT*  Clinical Data: Weakness.  Occipital headache.  CT HEAD WITHOUT CONTRAST  Technique:  Contiguous axial images were obtained from the base of the skull through the vertex without contrast.  Comparison: Head CT 07/08/2004.  Findings: There is mild cerebral atrophy, ventriculomegaly and periventricular white matter disease which is somewhat progressive since 2006.  No extra-axial fluid collections are seen.  No CT findings  for acute hemispheric infarction or intracranial hemorrhage.  There are benign appearing bilateral basal ganglia calcifications.  The brainstem and cerebellum are unremarkable and stable.  No mass lesions.  The bony structures are intact.  No fracture or bone lesions. There is scattered ethmoid sinus disease and opacification of the right maxillary sinus.  The mastoid air cells and middle ear cavities are clear.  Globes are intact.  IMPRESSION:  1.  Progressive cerebral atrophy, ventriculomegaly and periventricular white matter disease since 2006. 2.  No acute intracranial findings or mass lesions.  Original Report Authenticated By: P. Kalman Jewels, M.D.    Admission HPI: Ms. Crittendon is a 65 year old woman with a history of coronary artery disease, aortic stenosis status post porcine valve replacement, cerebral infarction after her valve replacement surgery, breast cancer status post left mastectomy, diabetes, hypertension, and hyperlipidemia  who presented with a three-day history of weakness, fevers, and generalized malaise. This was associated with a loss of appetite and sweats. She is unsure if she got the flu shot this year. One family member she lives with has been ill recently. She was unable to get up when sitting in bed and her family called EMS to have her brought [facility] ED for further evaluation. In the emergency department she was PCR positive for influenza A. Because of her generalized weakness, her family felt she could not be cared for at home at this time. She was therefore admitted for further evaluation.  Temp: 103 F      BP: 148/77    Pulse: 91   Resp: 20   SpO2: 97% General: resting in bed, eyes tearing at times, plate sitting next to bed half eaten  HEENT: PERRL, EOMI, no scleral icterus  Cardiac: regular rate and rhythm, systolic murmur heard  Pulm: clear to auscultation bilaterally, moving normal volumes of air  Abd: soft, nontender, nondistended, BS present  Ext: warm  and well perfused, no pedal edema  Neuro: alert and oriented X3, cranial nerves II-XII grossly intact, strength and sensation to light touch equal in bilateral upper and lower extremities, no focal weakness,    Hospital Course by problem list:   1. Influenza: Pt was admitted with positive Influenza A on pcr which was likely cause of her recent generalized weakness, headaches and fever.  CT of the head without acute intracranial abnormalities to explain weakness on presentation. Chest Xray without infiltrates or effusions. She remained hemodynamically stable throughout this admission and responded well to Tamiflu therapy and supportive care which included Tylenol for fever and muscle aches. Will be discharged on Tamiflu for 2 days to complete 5 day course.  2. DIABETES MELLITUS II: Patient had elevated cbg's which were likely elevated due to her acute illness. We continued her home regimen of Lantus 24 units qhs, metformin 1000 mg bid with meals and insulin aspart 8 units tid with meals. Will continue home regimen on discharge.  3. Hypertension: Patient demonstrated adequate blood pressure control with systolic bp in 120's.  We continued her home regimen of amlodipine 10 mg qd, metoprolol 50 mg qd, and HCTZ 25 mg qd and will discharge without changes to her home regimen.  4. Right sided hip/lower back pain: Patient had recent fall a day or so prior to admission secondary to generalized weakness. Given her h/o of osteoporosis with DEXA -2.8 there was concern for possible bony fracture and x-ray of the right hip was obtained which demonstrated no fracture, dislocation, or acute abnormality. She responded well to tramadol for pain and was continued on her home regimen of alendronate. Will discharge with Tramadol prn.  5. Disposition: Physical therapy was consulted to assess patient's functional status and agreed with the medical team that the patient would benefit from skilled physical therapy in an acute  setting. Social worker and case manager consulted with patient and her daughter. Patient desired to be discharged home with home health PT. It was discussed with the patient that Medicare would not cover the 24 hour supervision/assistance that was recommended per physical therapy assessment here in the hospital and she has therefore agreed to SNF placement. Patient is to followup with Dr. Tonny Branch in the Internal Medicine Clinic 02/04/2011 at 9:15 AM   Discharge Vitals:  BP 132/77  Pulse 59  Temp(Src) 97.7 F (36.5 C) (Oral)  Resp 18  Ht 5\' 7"  (1.702  m)  Wt 175 lb (79.379 kg)  BMI 27.41 kg/m2  SpO2 96%  Discharge Labs:  Results for orders placed during the hospital encounter of 01/25/11 (from the past 24 hour(s))  GLUCOSE, CAPILLARY     Status: Abnormal   Collection Time   01/27/11 11:33 AM      Component Value Range   Glucose-Capillary 207 (*) 70 - 99 (mg/dL)   Comment 1 Documented in Chart    GLUCOSE, CAPILLARY     Status: Abnormal   Collection Time   01/27/11  4:10 PM      Component Value Range   Glucose-Capillary 123 (*) 70 - 99 (mg/dL)  GLUCOSE, CAPILLARY     Status: Abnormal   Collection Time   01/27/11  9:32 PM      Component Value Range   Glucose-Capillary 125 (*) 70 - 99 (mg/dL)  GLUCOSE, CAPILLARY     Status: Abnormal   Collection Time   01/28/11  6:35 AM      Component Value Range   Glucose-Capillary 134 (*) 70 - 99 (mg/dL)    Signed: Michail Sermon, Madhuri Vacca 01/28/2011, 11:30 AM

## 2011-01-28 LAB — GLUCOSE, CAPILLARY
Glucose-Capillary: 134 mg/dL — ABNORMAL HIGH (ref 70–99)
Glucose-Capillary: 135 mg/dL — ABNORMAL HIGH (ref 70–99)

## 2011-01-28 MED ORDER — OSELTAMIVIR PHOSPHATE 75 MG PO CAPS: 75.0000 mg | ORAL_CAPSULE | Freq: Two times a day (BID) | ORAL | Status: DC

## 2011-01-28 MED ORDER — OSELTAMIVIR PHOSPHATE 75 MG PO CAPS: 75.0000 mg | ORAL_CAPSULE | Freq: Two times a day (BID) | ORAL | Status: AC

## 2011-01-28 NOTE — Progress Notes (Signed)
Occupational Therapy Evaluation Patient Details Name: [name] MRN: [id number] DOB: 1945/08/10 Today's Date: 01/28/2011  Problem List:  Patient Active Problem List  Diagnoses  . DIABETES MELLITUS, TYPE II  . DEPRESSION  . Hyperlipidemia  . Refusal of blood transfusions as patient is Jehovah's Witness  . Adenocarcinoma of breast  . Osteoporosis  . CVA (cerebral infarction)  . Aortic stenosis, severe  . CAD (coronary artery disease)  . Hypertension  . Routine health maintenance  . Influenza A    Past Medical History:  Past Medical History  Diagnosis Date  . CAD (coronary artery disease) 2006    Cath 5/06 50% stenosis of RCA. s/p CABG (5/06) w/ saphenous vein to RCA. Treated with statin, BB, & ASA.   Marland Kitchen Aortic stenosis, severe 2006    s/p aortic valve replacement with porcine valve 06/2004. ECHO 2010 EF 81%, LVH, diastolic dysfxn, Bioprostetic aoritc valve, mild AS.  Marland Kitchen CVA (cerebral infarction) 2006    Post-op from AVR. Presumed embolic in nature. Carotid stenosis of R 60-79%. Repeat dopplers 4/10 no R stenosis and L stenosis of 1-29%.  . Hyperlipidemia     Mgmt with a statin  . Refusal of blood transfusions as patient is Jehovah's Witness   . Diverticulosis 2001  . Adenocarcinoma of breast 1996    Completed tamoxifen and had mastectomy.  . Osteoporosis 2006    DEXA 10/06 : L femur T -2.8, R -2.7. Lumbar T -2.4. On bisphosphonates and  Calcium / Vit D.  . Hypertension     Requires 4 drug tx  . Diabetes mellitus     Dx 04/25/1990.    Past Surgical History:  Past Surgical History  Procedure Date  . Cholecystectomy   . Mastectomy 1995    L for adenocarcinoma  . Abdominal hysterectomy 1987    for fibroids  . Coronary artery bypass graft 2006    Saphenous vein to RCA  . Aortic valve replacement 2006    OT Assessment/Plan/Recommendation OT Assessment Clinical Impression Statement: pt with decreased activity tolerance, bed mobility  OT Recommendation/Assessment:  Patient will need skilled OT in the acute care venue OT Problem List: Decreased strength;Decreased activity tolerance;Impaired balance (sitting and/or standing);Decreased safety awareness;Decreased knowledge of use of DME or AE;Decreased knowledge of precautions;Pain Problem List Comments: pt required mod v/c with mod tactile input to keep RW in front of patient for sink level tasks. Pt attempting to abandon RW OT Therapy Diagnosis : Generalized weakness OT Plan OT Frequency: Min 2X/week OT Treatment/Interventions: Self-care/ADL training;DME and/or AE instruction;Energy conservation;Therapeutic activities;Balance training;Patient/family education OT Recommendation Follow Up Recommendations: Home health OT Equipment Recommended: Defer to next venue Individuals Consulted Consulted and Agree with Results and Recommendations: Patient OT Goals Acute Rehab OT Goals OT Goal Formulation: With patient/family Time For Goal Achievement: 2 weeks ADL Goals Pt Will Perform Lower Body Bathing: with min assist;Sit to stand from chair;Sit to stand from bed (ae prn) Pt Will Perform Upper Body Dressing: with modified independence;Sit to stand from chair;Sit to stand from bed Pt Will Perform Lower Body Dressing: with min assist;with adaptive equipment;Sit to stand from bed;Sit to stand from chair Pt Will Transfer to Toilet: with modified independence;Ambulation;Stand pivot transfer;3-in-1 Pt Will Perform Toileting - Clothing Manipulation: with modified independence;Sitting on 3-in-1 or toilet Pt Will Perform Toileting - Hygiene: with modified independence;Sit to stand from 3-in-1/toilet Pt Will Perform Tub/Shower Transfer: with min assist;Stand pivot transfer;with DME;Shower seat with back Additional ADL Goal #1: pt will verbalize 2 energy conservation techiques  for home ADLs to help increase activity tolerance.  OT Evaluation Precautions/Restrictions  Precautions Precautions: Fall Required Braces or  Orthoses: No Restrictions Weight Bearing Restrictions: No Prior Functioning Home Living Lives With: Daughter Receives Help From: Family Type of Home: Apartment Home Layout: Two level Alternate Level Stairs-Rails: Right Alternate Level Stairs-Number of Steps: 13 Home Access: Stairs to enter Entrance Stairs-Rails: None Entrance Stairs-Number of Steps: 1 Bathroom Shower/Tub: Product/process development scientist: Standard Bathroom Accessibility: Yes How Accessible: Accessible via walker Home Adaptive Equipment: Walker - rolling;Straight cane;Shower chair with back Additional Comments: pt at baseline difficulty with tub transfer Prior Function Level of Independence: Independent with gait;Needs assistance with tranfers;Independent with basic ADLs Able to Take Stairs?: Yes Driving: No Vocation: Retired ADL ADL Grooming: Performed;Wash/dry hands;Supervision/safety Where Assessed - Grooming: Standing at sink Lower Body Dressing: Performed;Moderate assistance Where Assessed - Lower Body Dressing: Sit to stand from chair Toilet Transfer: Supervision/safety Toilet Transfer Method: Stand pivot;Ambulating Toilet Transfer Equipment: Raised toilet seat with arms (or 3-in-1 over toilet) Toileting - Clothing Manipulation: Performed;Moderate assistance Toileting - Clothing Manipulation Details (indicate cue type and reason): pt able to doff adult diaper, however when donning diaper pt dropped diaper to ankles and was unable to reach and pull bottoms back up without assistance Where Assessed - Toileting Clothing Manipulation: Sit to stand from 3-in-1 or toilet Toileting - Hygiene: Performed;Set up Where Assessed - Toileting Hygiene: Sit on 3-in-1 or toilet Tub/Shower Transfer: Simulated;Maximal assistance Tub/Shower Transfer Details (indicate cue type and reason): simulated stepping over trash can. Pt could not clear LLE over trash can. Task was terminated for safety, Tub/Shower Transfer  Method: Stand pivot Equipment Used: Rolling walker Vision/Perception    Cognition Cognition Arousal/Alertness: Awake/alert Overall Cognitive Status: Appears within functional limits for tasks assessed Sensation/Coordination Sensation Light Touch: Appears Intact Coordination Gross Motor Movements are Fluid and Coordinated: Yes Fine Motor Movements are Fluid and Coordinated: Yes Extremity Assessment RUE Assessment RUE Assessment: Within Functional Limits LUE Assessment LUE Assessment: Within Functional Limits Mobility  Bed Mobility Bed Mobility: Yes Supine to Sit: 3: Mod assist;HOB flat;With rails Transfers Transfers: Yes Sit to Stand: 4: Min assist;From bed;With upper extremity assist Stand to Sit: 4: Min assist;With upper extremity assist;To bed Exercises   End of Session OT - End of Session Equipment Utilized During Treatment: Gait belt Activity Tolerance: Patient tolerated treatment well Patient left: in bed;with call bell in reach Nurse Communication: Mobility status for transfers General Behavior During Session: [facility] for tasks performed Cognition: [facility] for tasks performed  Next Session: address tub transfer and Energy conservation   Veneda Melter 01/28/2011, 10:59 AM  Pager: [phone]

## 2011-01-28 NOTE — Progress Notes (Signed)
Clinical Social Work-CSW facilitated pt d/c to [facility] completed; chart copied and [facility] transport arranged-no further needs at this time. Gerre Scull, [phone]

## 2011-01-28 NOTE — Plan of Care (Signed)
Problem: Phase I Progression Outcomes Goal: Progress activity as tolerated unless otherwise ordered Outcome: Progressing Tolerated supine<>Sit<>sink level grooming<>toilet transfer with hyigene<>sink level task<>supine.

## 2011-02-04 ENCOUNTER — Encounter: Payer: Medicare Other | Admitting: Internal Medicine

## 2011-02-18 DIAGNOSIS — M545 Low back pain, unspecified: Secondary | ICD-10-CM | POA: Diagnosis not present

## 2011-02-18 DIAGNOSIS — R269 Unspecified abnormalities of gait and mobility: Secondary | ICD-10-CM | POA: Diagnosis not present

## 2011-02-18 DIAGNOSIS — I10 Essential (primary) hypertension: Secondary | ICD-10-CM | POA: Diagnosis not present

## 2011-02-18 DIAGNOSIS — I69959 Hemiplegia and hemiparesis following unspecified cerebrovascular disease affecting unspecified side: Secondary | ICD-10-CM | POA: Diagnosis not present

## 2011-02-18 DIAGNOSIS — I2510 Atherosclerotic heart disease of native coronary artery without angina pectoris: Secondary | ICD-10-CM | POA: Diagnosis not present

## 2011-02-18 DIAGNOSIS — E119 Type 2 diabetes mellitus without complications: Secondary | ICD-10-CM | POA: Diagnosis not present

## 2011-02-20 DIAGNOSIS — I2510 Atherosclerotic heart disease of native coronary artery without angina pectoris: Secondary | ICD-10-CM | POA: Diagnosis not present

## 2011-02-20 DIAGNOSIS — I69959 Hemiplegia and hemiparesis following unspecified cerebrovascular disease affecting unspecified side: Secondary | ICD-10-CM | POA: Diagnosis not present

## 2011-02-20 DIAGNOSIS — I10 Essential (primary) hypertension: Secondary | ICD-10-CM | POA: Diagnosis not present

## 2011-02-20 DIAGNOSIS — E119 Type 2 diabetes mellitus without complications: Secondary | ICD-10-CM | POA: Diagnosis not present

## 2011-02-20 DIAGNOSIS — M545 Low back pain, unspecified: Secondary | ICD-10-CM | POA: Diagnosis not present

## 2011-02-20 DIAGNOSIS — R269 Unspecified abnormalities of gait and mobility: Secondary | ICD-10-CM | POA: Diagnosis not present

## 2011-02-23 DIAGNOSIS — I2510 Atherosclerotic heart disease of native coronary artery without angina pectoris: Secondary | ICD-10-CM | POA: Diagnosis not present

## 2011-02-23 DIAGNOSIS — I10 Essential (primary) hypertension: Secondary | ICD-10-CM | POA: Diagnosis not present

## 2011-02-23 DIAGNOSIS — M545 Low back pain, unspecified: Secondary | ICD-10-CM | POA: Diagnosis not present

## 2011-02-23 DIAGNOSIS — E119 Type 2 diabetes mellitus without complications: Secondary | ICD-10-CM | POA: Diagnosis not present

## 2011-02-23 DIAGNOSIS — R269 Unspecified abnormalities of gait and mobility: Secondary | ICD-10-CM | POA: Diagnosis not present

## 2011-02-23 DIAGNOSIS — I69959 Hemiplegia and hemiparesis following unspecified cerebrovascular disease affecting unspecified side: Secondary | ICD-10-CM | POA: Diagnosis not present

## 2011-02-26 DIAGNOSIS — I69959 Hemiplegia and hemiparesis following unspecified cerebrovascular disease affecting unspecified side: Secondary | ICD-10-CM | POA: Diagnosis not present

## 2011-02-26 DIAGNOSIS — I2510 Atherosclerotic heart disease of native coronary artery without angina pectoris: Secondary | ICD-10-CM | POA: Diagnosis not present

## 2011-02-26 DIAGNOSIS — M545 Low back pain, unspecified: Secondary | ICD-10-CM | POA: Diagnosis not present

## 2011-02-26 DIAGNOSIS — E119 Type 2 diabetes mellitus without complications: Secondary | ICD-10-CM | POA: Diagnosis not present

## 2011-02-26 DIAGNOSIS — R269 Unspecified abnormalities of gait and mobility: Secondary | ICD-10-CM | POA: Diagnosis not present

## 2011-02-26 DIAGNOSIS — I10 Essential (primary) hypertension: Secondary | ICD-10-CM | POA: Diagnosis not present

## 2011-02-27 DIAGNOSIS — M545 Low back pain, unspecified: Secondary | ICD-10-CM | POA: Diagnosis not present

## 2011-02-27 DIAGNOSIS — I69959 Hemiplegia and hemiparesis following unspecified cerebrovascular disease affecting unspecified side: Secondary | ICD-10-CM | POA: Diagnosis not present

## 2011-02-27 DIAGNOSIS — I10 Essential (primary) hypertension: Secondary | ICD-10-CM | POA: Diagnosis not present

## 2011-02-27 DIAGNOSIS — E119 Type 2 diabetes mellitus without complications: Secondary | ICD-10-CM | POA: Diagnosis not present

## 2011-02-27 DIAGNOSIS — R269 Unspecified abnormalities of gait and mobility: Secondary | ICD-10-CM | POA: Diagnosis not present

## 2011-02-27 DIAGNOSIS — I2510 Atherosclerotic heart disease of native coronary artery without angina pectoris: Secondary | ICD-10-CM | POA: Diagnosis not present

## 2011-03-03 DIAGNOSIS — E119 Type 2 diabetes mellitus without complications: Secondary | ICD-10-CM | POA: Diagnosis not present

## 2011-03-03 DIAGNOSIS — M545 Low back pain, unspecified: Secondary | ICD-10-CM | POA: Diagnosis not present

## 2011-03-03 DIAGNOSIS — R269 Unspecified abnormalities of gait and mobility: Secondary | ICD-10-CM | POA: Diagnosis not present

## 2011-03-03 DIAGNOSIS — I69959 Hemiplegia and hemiparesis following unspecified cerebrovascular disease affecting unspecified side: Secondary | ICD-10-CM | POA: Diagnosis not present

## 2011-03-03 DIAGNOSIS — I10 Essential (primary) hypertension: Secondary | ICD-10-CM | POA: Diagnosis not present

## 2011-03-03 DIAGNOSIS — I2510 Atherosclerotic heart disease of native coronary artery without angina pectoris: Secondary | ICD-10-CM | POA: Diagnosis not present

## 2011-03-05 DIAGNOSIS — M545 Low back pain, unspecified: Secondary | ICD-10-CM | POA: Diagnosis not present

## 2011-03-05 DIAGNOSIS — I10 Essential (primary) hypertension: Secondary | ICD-10-CM | POA: Diagnosis not present

## 2011-03-05 DIAGNOSIS — I2510 Atherosclerotic heart disease of native coronary artery without angina pectoris: Secondary | ICD-10-CM | POA: Diagnosis not present

## 2011-03-05 DIAGNOSIS — I69959 Hemiplegia and hemiparesis following unspecified cerebrovascular disease affecting unspecified side: Secondary | ICD-10-CM | POA: Diagnosis not present

## 2011-03-05 DIAGNOSIS — E119 Type 2 diabetes mellitus without complications: Secondary | ICD-10-CM | POA: Diagnosis not present

## 2011-03-05 DIAGNOSIS — R269 Unspecified abnormalities of gait and mobility: Secondary | ICD-10-CM | POA: Diagnosis not present

## 2011-03-06 DIAGNOSIS — M545 Low back pain, unspecified: Secondary | ICD-10-CM | POA: Diagnosis not present

## 2011-03-06 DIAGNOSIS — I69959 Hemiplegia and hemiparesis following unspecified cerebrovascular disease affecting unspecified side: Secondary | ICD-10-CM | POA: Diagnosis not present

## 2011-03-06 DIAGNOSIS — I10 Essential (primary) hypertension: Secondary | ICD-10-CM | POA: Diagnosis not present

## 2011-03-06 DIAGNOSIS — E119 Type 2 diabetes mellitus without complications: Secondary | ICD-10-CM | POA: Diagnosis not present

## 2011-03-06 DIAGNOSIS — I2510 Atherosclerotic heart disease of native coronary artery without angina pectoris: Secondary | ICD-10-CM | POA: Diagnosis not present

## 2011-03-06 DIAGNOSIS — R269 Unspecified abnormalities of gait and mobility: Secondary | ICD-10-CM | POA: Diagnosis not present

## 2011-03-10 DIAGNOSIS — E119 Type 2 diabetes mellitus without complications: Secondary | ICD-10-CM | POA: Diagnosis not present

## 2011-03-10 DIAGNOSIS — M545 Low back pain, unspecified: Secondary | ICD-10-CM | POA: Diagnosis not present

## 2011-03-10 DIAGNOSIS — I69959 Hemiplegia and hemiparesis following unspecified cerebrovascular disease affecting unspecified side: Secondary | ICD-10-CM | POA: Diagnosis not present

## 2011-03-10 DIAGNOSIS — R269 Unspecified abnormalities of gait and mobility: Secondary | ICD-10-CM | POA: Diagnosis not present

## 2011-03-10 DIAGNOSIS — I10 Essential (primary) hypertension: Secondary | ICD-10-CM | POA: Diagnosis not present

## 2011-03-10 DIAGNOSIS — I2510 Atherosclerotic heart disease of native coronary artery without angina pectoris: Secondary | ICD-10-CM | POA: Diagnosis not present

## 2011-03-12 DIAGNOSIS — M545 Low back pain, unspecified: Secondary | ICD-10-CM | POA: Diagnosis not present

## 2011-03-12 DIAGNOSIS — R269 Unspecified abnormalities of gait and mobility: Secondary | ICD-10-CM | POA: Diagnosis not present

## 2011-03-12 DIAGNOSIS — I2510 Atherosclerotic heart disease of native coronary artery without angina pectoris: Secondary | ICD-10-CM | POA: Diagnosis not present

## 2011-03-12 DIAGNOSIS — I10 Essential (primary) hypertension: Secondary | ICD-10-CM | POA: Diagnosis not present

## 2011-03-12 DIAGNOSIS — E119 Type 2 diabetes mellitus without complications: Secondary | ICD-10-CM | POA: Diagnosis not present

## 2011-03-12 DIAGNOSIS — I69959 Hemiplegia and hemiparesis following unspecified cerebrovascular disease affecting unspecified side: Secondary | ICD-10-CM | POA: Diagnosis not present

## 2011-03-13 DIAGNOSIS — E119 Type 2 diabetes mellitus without complications: Secondary | ICD-10-CM | POA: Diagnosis not present

## 2011-03-13 DIAGNOSIS — M545 Low back pain, unspecified: Secondary | ICD-10-CM | POA: Diagnosis not present

## 2011-03-13 DIAGNOSIS — I69959 Hemiplegia and hemiparesis following unspecified cerebrovascular disease affecting unspecified side: Secondary | ICD-10-CM | POA: Diagnosis not present

## 2011-03-13 DIAGNOSIS — R269 Unspecified abnormalities of gait and mobility: Secondary | ICD-10-CM | POA: Diagnosis not present

## 2011-03-13 DIAGNOSIS — I2510 Atherosclerotic heart disease of native coronary artery without angina pectoris: Secondary | ICD-10-CM | POA: Diagnosis not present

## 2011-03-13 DIAGNOSIS — I10 Essential (primary) hypertension: Secondary | ICD-10-CM | POA: Diagnosis not present

## 2011-03-20 ENCOUNTER — Other Ambulatory Visit: Payer: Self-pay | Admitting: Internal Medicine

## 2011-03-20 ENCOUNTER — Ambulatory Visit (HOSPITAL_COMMUNITY)
Admission: RE | Admit: 2011-03-20 | Discharge: 2011-03-20 | Disposition: A | Discharge status: Home / Self Care | Payer: Medicare Other | Source: Ambulatory Visit | Attending: Internal Medicine | Admitting: Internal Medicine

## 2011-03-20 ENCOUNTER — Encounter: Payer: Self-pay | Admitting: Internal Medicine

## 2011-03-20 ENCOUNTER — Ambulatory Visit (INDEPENDENT_AMBULATORY_CARE_PROVIDER_SITE_OTHER): Payer: Medicare Other | Admitting: Internal Medicine

## 2011-03-20 VITALS — BP 140/80 | HR 67 | Temp 97.7°F | Ht 67.0 in | Wt 173.3 lb

## 2011-03-20 DIAGNOSIS — R05 Cough: Secondary | ICD-10-CM | POA: Insufficient documentation

## 2011-03-20 DIAGNOSIS — R0989 Other specified symptoms and signs involving the circulatory and respiratory systems: Secondary | ICD-10-CM | POA: Insufficient documentation

## 2011-03-20 DIAGNOSIS — I10 Essential (primary) hypertension: Secondary | ICD-10-CM

## 2011-03-20 DIAGNOSIS — E119 Type 2 diabetes mellitus without complications: Secondary | ICD-10-CM

## 2011-03-20 DIAGNOSIS — R053 Chronic cough: Secondary | ICD-10-CM

## 2011-03-20 DIAGNOSIS — Z87311 Personal history of (healed) other pathological fracture: Secondary | ICD-10-CM | POA: Diagnosis not present

## 2011-03-20 DIAGNOSIS — M810 Age-related osteoporosis without current pathological fracture: Secondary | ICD-10-CM

## 2011-03-20 DIAGNOSIS — M8440XA Pathological fracture, unspecified site, initial encounter for fracture: Secondary | ICD-10-CM | POA: Diagnosis not present

## 2011-03-20 DIAGNOSIS — I2510 Atherosclerotic heart disease of native coronary artery without angina pectoris: Secondary | ICD-10-CM

## 2011-03-20 DIAGNOSIS — R059 Cough, unspecified: Secondary | ICD-10-CM

## 2011-03-20 DIAGNOSIS — Z0000 Encounter for general adult medical examination without abnormal findings: Secondary | ICD-10-CM

## 2011-03-20 DIAGNOSIS — E785 Hyperlipidemia, unspecified: Secondary | ICD-10-CM

## 2011-03-20 LAB — LIPID PANEL
Cholesterol: 133 mg/dL (ref 0–200)
HDL: 29 mg/dL — ABNORMAL LOW (ref >39)
LDL Cholesterol: 66 mg/dL (ref 0–99)
Total CHOL/HDL Ratio: 4.6 Ratio
Triglycerides: 188 mg/dL — ABNORMAL HIGH (ref <150)
VLDL: 38 mg/dL (ref 0–40)

## 2011-03-20 LAB — GLUCOSE, CAPILLARY: Glucose-Capillary: 183 mg/dL — ABNORMAL HIGH (ref 70–99)

## 2011-03-20 LAB — POCT GLYCOSYLATED HEMOGLOBIN (HGB A1C): Hemoglobin A1C: 7.1

## 2011-03-20 MED ORDER — HYDROCHLOROTHIAZIDE 25 MG PO TABS: 25.0000 mg | ORAL_TABLET | Freq: Every day | ORAL | Status: DC

## 2011-03-20 MED ORDER — GUAIFENESIN ER 600 MG PO TB12: 1200.0000 mg | ORAL_TABLET | Freq: Two times a day (BID) | ORAL | Status: DC

## 2011-03-20 MED ORDER — GABAPENTIN 300 MG PO CAPS: 300.0000 mg | ORAL_CAPSULE | Freq: Three times a day (TID) | ORAL | Status: DC

## 2011-03-20 MED ORDER — FREESTYLE LANCETS MISC: Status: DC

## 2011-03-20 MED ORDER — "PEN NEEDLES 3/16"" 31G X 5 MM MISC": 1.0000 | Freq: Four times a day (QID) | Status: DC

## 2011-03-20 MED ORDER — INSULIN GLARGINE 100 UNIT/ML ~~LOC~~ SOLN: 24.0000 [IU] | Freq: Every day | SUBCUTANEOUS | Status: DC

## 2011-03-20 MED ORDER — METOPROLOL SUCCINATE ER 50 MG PO TB24: 50.0000 mg | ORAL_TABLET | Freq: Every day | ORAL | Status: DC

## 2011-03-20 NOTE — Progress Notes (Signed)
  Subjective:    Patient ID: [name], female    DOB: 14-Apr-1945, 66 y.o.   MRN: [id number]  HPI  Please see the A&P for the status of the pt's chronic medical problems.   Review of Systems  Constitutional: Positive for fever and fatigue. Negative for chills, activity change, appetite change and unexpected weight change.  HENT: Positive for congestion and rhinorrhea. Negative for ear pain, sore throat, facial swelling, sinus pressure and tinnitus.   Eyes: Negative for pain, redness and itching.  Respiratory: Positive for cough. Negative for choking, chest tightness and shortness of breath.   Cardiovascular: Positive for leg swelling. Negative for chest pain.  Gastrointestinal: Positive for nausea. Negative for vomiting, abdominal pain and diarrhea.  Musculoskeletal: Positive for gait problem.  Skin: Negative for rash and wound.  Neurological: Positive for weakness.  Psychiatric/Behavioral: Negative for sleep disturbance.       Objective:   Physical Exam  Constitutional: She appears well-developed and well-nourished. No distress.  HENT:  Head: Normocephalic and atraumatic.  Right Ear: External ear normal.  Left Ear: External ear normal.  Nose: Nose normal.  Mouth/Throat: Oropharynx is clear and moist. No oropharyngeal exudate.  Eyes: Conjunctivae are normal. Right eye exhibits no discharge. Left eye exhibits no discharge.  Neck: No thyromegaly present.  Cardiovascular: Normal rate, regular rhythm and normal heart sounds.   Pulmonary/Chest: Effort normal.       Inspiratory crackles B, faint.  Musculoskeletal: She exhibits no edema.       Trace on LLE  Lymphadenopathy:    She has no cervical adenopathy.  Neurological: She is alert.  Skin: Skin is warm and dry. No rash noted. She is not diaphoretic.  Psychiatric: She has a normal mood and affect. Her behavior is normal. Judgment and thought content normal.          Assessment & Plan:

## 2011-03-20 NOTE — Assessment & Plan Note (Signed)
She did not bring her meter today. She checks her CBG QID. Her lowest was in 80's and was asymptomatic. She had question about pre-prandial coverage with novolog. She had always given herself 8 units before meals, more if it was high. When went to SNF, they wouldn't if her CBG was < 150. I encouraged to continue to use pre-prandial but to make certain she eats after using the novolog. I also encouraged her to call me if she notices low values. Her appetite is off a bit, but she is not concerned as she wants to loose a bit of weight. She has lost about 10 lbs since Sep 2012. Her A1C is great today. Her Cr is OK for metformin. She seems to have great understanding of her DM and is motivated. She sees an eye MD Sabra Heck) and I have requested her notes from last months' visit.  Lab Results  Component Value Date   HGBA1C 7.1 03/20/2011

## 2011-03-20 NOTE — Assessment & Plan Note (Signed)
I am checking her lipids today to see if I can lower her statin dose due to her norvasc dose. I may need to change to a more potent statin. However, her last LDL are fantastic.

## 2011-03-20 NOTE — Assessment & Plan Note (Signed)
Incidental T11 fx seen on CXR. On max therapy.

## 2011-03-20 NOTE — Assessment & Plan Note (Signed)
Cough has been persistant since flu 12/12. All other sxs resolved except productive cough and congestion. She feels congestion in her head and is blowing her nose and coughing up congestion. Subjective fever only. No SOB, CP. Lifelong non-smoker. Cough & other non-specific sxs after flu can take 4-6 weeks to resolve. However, she had a mildly abnl exam so I repeated the CXR but it doesn't have sig findings. Tried mucinex to help with sxs. Otherwise, time will help to resolve sxs.

## 2011-03-20 NOTE — Assessment & Plan Note (Signed)
BP not at goal but was better last two values. Will watch for now. Goal <130/80 bc CAD, CVA, and DM. But need to watch as also has AS.   BP Readings from Last 3 Encounters:  03/20/11 140/80  01/28/11 132/77  11/14/10 188/81

## 2011-03-20 NOTE — Patient Instructions (Addendum)
Go and get your chest Xray then go home.   I will a call you if there is an issue  See me in 3 months  Try the Mucinex for your cough / congestion

## 2011-03-20 NOTE — Telephone Encounter (Signed)
Toprol dose is 50. Will refill in my office encounter  For some reason, this gabapentin Rx was D/C'd. Will refill it in office encounter

## 2011-03-20 NOTE — Assessment & Plan Note (Signed)
She was D/C'd to SNF 12/12 after fall and flu. Was there 3 weeks. Now back home with daughter, who works. Feels a bit stronger but still would like to cont to improve. Pt OK at home - uses walker. No falls. Has another 3 weeks of PT left. Has bath chair. Wants transfer board into bath chair but already told that medicare doesn't cover.

## 2011-03-24 DIAGNOSIS — I2510 Atherosclerotic heart disease of native coronary artery without angina pectoris: Secondary | ICD-10-CM | POA: Diagnosis not present

## 2011-03-24 DIAGNOSIS — I10 Essential (primary) hypertension: Secondary | ICD-10-CM | POA: Diagnosis not present

## 2011-03-24 DIAGNOSIS — E119 Type 2 diabetes mellitus without complications: Secondary | ICD-10-CM | POA: Diagnosis not present

## 2011-03-24 DIAGNOSIS — R269 Unspecified abnormalities of gait and mobility: Secondary | ICD-10-CM | POA: Diagnosis not present

## 2011-03-24 DIAGNOSIS — M545 Low back pain, unspecified: Secondary | ICD-10-CM | POA: Diagnosis not present

## 2011-03-24 DIAGNOSIS — I69959 Hemiplegia and hemiparesis following unspecified cerebrovascular disease affecting unspecified side: Secondary | ICD-10-CM | POA: Diagnosis not present

## 2011-03-26 ENCOUNTER — Telehealth: Payer: Self-pay | Admitting: *Deleted

## 2011-03-26 NOTE — Telephone Encounter (Signed)
Call from Manuela Schwartz, Occupational Therapist with [facility] asking to extend OT home health orders to 2 weeks one time and 1 week one time.  VO given, please let me know if this is okay with you.

## 2011-03-26 NOTE — Telephone Encounter (Signed)
VO is OK with me. Thanks for doing this.

## 2011-04-01 NOTE — Telephone Encounter (Signed)
Opened in error

## 2011-04-02 ENCOUNTER — Other Ambulatory Visit: Payer: Self-pay | Admitting: Internal Medicine

## 2011-05-21 ENCOUNTER — Emergency Department (HOSPITAL_COMMUNITY): Payer: Medicare Other

## 2011-05-21 ENCOUNTER — Encounter (HOSPITAL_COMMUNITY): Payer: Self-pay

## 2011-05-21 ENCOUNTER — Inpatient Hospital Stay (HOSPITAL_COMMUNITY)
Admission: EM | Admit: 2011-05-21 | Discharge: 2011-05-26 | DRG: 193 | Disposition: A | Discharge status: Home Health | Payer: Medicare Other | Attending: Internal Medicine | Admitting: Internal Medicine

## 2011-05-21 ENCOUNTER — Other Ambulatory Visit: Payer: Self-pay

## 2011-05-21 DIAGNOSIS — I359 Nonrheumatic aortic valve disorder, unspecified: Secondary | ICD-10-CM | POA: Diagnosis not present

## 2011-05-21 DIAGNOSIS — Z6826 Body mass index (BMI) 26.0-26.9, adult: Secondary | ICD-10-CM

## 2011-05-21 DIAGNOSIS — M810 Age-related osteoporosis without current pathological fracture: Secondary | ICD-10-CM | POA: Diagnosis present

## 2011-05-21 DIAGNOSIS — R0989 Other specified symptoms and signs involving the circulatory and respiratory systems: Secondary | ICD-10-CM | POA: Diagnosis not present

## 2011-05-21 DIAGNOSIS — Z7982 Long term (current) use of aspirin: Secondary | ICD-10-CM | POA: Diagnosis not present

## 2011-05-21 DIAGNOSIS — Z8673 Personal history of transient ischemic attack (TIA), and cerebral infarction without residual deficits: Secondary | ICD-10-CM

## 2011-05-21 DIAGNOSIS — J9600 Acute respiratory failure, unspecified whether with hypoxia or hypercapnia: Secondary | ICD-10-CM | POA: Diagnosis present

## 2011-05-21 DIAGNOSIS — T502X5A Adverse effect of carbonic-anhydrase inhibitors, benzothiadiazides and other diuretics, initial encounter: Secondary | ICD-10-CM | POA: Diagnosis present

## 2011-05-21 DIAGNOSIS — I10 Essential (primary) hypertension: Secondary | ICD-10-CM | POA: Diagnosis present

## 2011-05-21 DIAGNOSIS — Z952 Presence of prosthetic heart valve: Secondary | ICD-10-CM

## 2011-05-21 DIAGNOSIS — R0602 Shortness of breath: Secondary | ICD-10-CM | POA: Diagnosis not present

## 2011-05-21 DIAGNOSIS — M129 Arthropathy, unspecified: Secondary | ICD-10-CM | POA: Diagnosis present

## 2011-05-21 DIAGNOSIS — E119 Type 2 diabetes mellitus without complications: Secondary | ICD-10-CM

## 2011-05-21 DIAGNOSIS — I2510 Atherosclerotic heart disease of native coronary artery without angina pectoris: Secondary | ICD-10-CM | POA: Diagnosis present

## 2011-05-21 DIAGNOSIS — I509 Heart failure, unspecified: Secondary | ICD-10-CM | POA: Diagnosis present

## 2011-05-21 DIAGNOSIS — Z794 Long term (current) use of insulin: Secondary | ICD-10-CM | POA: Diagnosis not present

## 2011-05-21 DIAGNOSIS — Z9981 Dependence on supplemental oxygen: Secondary | ICD-10-CM | POA: Diagnosis not present

## 2011-05-21 DIAGNOSIS — N179 Acute kidney failure, unspecified: Secondary | ICD-10-CM | POA: Diagnosis not present

## 2011-05-21 DIAGNOSIS — Z951 Presence of aortocoronary bypass graft: Secondary | ICD-10-CM | POA: Diagnosis not present

## 2011-05-21 DIAGNOSIS — I5032 Chronic diastolic (congestive) heart failure: Secondary | ICD-10-CM | POA: Diagnosis present

## 2011-05-21 DIAGNOSIS — D72829 Elevated white blood cell count, unspecified: Secondary | ICD-10-CM | POA: Diagnosis present

## 2011-05-21 DIAGNOSIS — N289 Disorder of kidney and ureter, unspecified: Secondary | ICD-10-CM | POA: Diagnosis present

## 2011-05-21 DIAGNOSIS — N189 Chronic kidney disease, unspecified: Secondary | ICD-10-CM

## 2011-05-21 DIAGNOSIS — R053 Chronic cough: Secondary | ICD-10-CM

## 2011-05-21 DIAGNOSIS — Z79899 Other long term (current) drug therapy: Secondary | ICD-10-CM | POA: Diagnosis not present

## 2011-05-21 DIAGNOSIS — E785 Hyperlipidemia, unspecified: Secondary | ICD-10-CM

## 2011-05-21 DIAGNOSIS — I639 Cerebral infarction, unspecified: Secondary | ICD-10-CM

## 2011-05-21 DIAGNOSIS — R059 Cough, unspecified: Secondary | ICD-10-CM | POA: Diagnosis not present

## 2011-05-21 DIAGNOSIS — I6350 Cerebral infarction due to unspecified occlusion or stenosis of unspecified cerebral artery: Secondary | ICD-10-CM | POA: Diagnosis not present

## 2011-05-21 DIAGNOSIS — R05 Cough: Secondary | ICD-10-CM

## 2011-05-21 DIAGNOSIS — R0789 Other chest pain: Secondary | ICD-10-CM | POA: Diagnosis not present

## 2011-05-21 DIAGNOSIS — J189 Pneumonia, unspecified organism: Principal | ICD-10-CM | POA: Diagnosis present

## 2011-05-21 DIAGNOSIS — R079 Chest pain, unspecified: Secondary | ICD-10-CM | POA: Diagnosis not present

## 2011-05-21 DIAGNOSIS — R0600 Dyspnea, unspecified: Secondary | ICD-10-CM

## 2011-05-21 DIAGNOSIS — I350 Nonrheumatic aortic (valve) stenosis: Secondary | ICD-10-CM | POA: Insufficient documentation

## 2011-05-21 HISTORY — DX: Cerebral infarction, unspecified: I63.9

## 2011-05-21 HISTORY — DX: Unspecified osteoarthritis, unspecified site: M19.90

## 2011-05-21 LAB — BASIC METABOLIC PANEL
BUN: 12 mg/dL (ref 6–23)
CO2: 25 mEq/L (ref 19–32)
Calcium: 9.4 mg/dL (ref 8.4–10.5)
Chloride: 102 mEq/L (ref 96–112)
Creatinine, Ser: 0.88 mg/dL (ref 0.50–1.10)
GFR calc Af Amer: 77 mL/min — ABNORMAL LOW (ref >90)
GFR calc non Af Amer: 66 mL/min — ABNORMAL LOW (ref >90)
Glucose, Bld: 235 mg/dL — ABNORMAL HIGH (ref 70–99)
Potassium: 3.8 mEq/L (ref 3.5–5.1)
Sodium: 138 mEq/L (ref 135–145)

## 2011-05-21 LAB — DIFFERENTIAL
Basophils Absolute: 0 10*3/uL (ref 0.0–0.1)
Basophils Relative: 0 % (ref 0–1)
Eosinophils Absolute: 0.2 10*3/uL (ref 0.0–0.7)
Eosinophils Relative: 2 % (ref 0–5)
Lymphocytes Relative: 18 % (ref 12–46)
Lymphs Abs: 1.7 10*3/uL (ref 0.7–4.0)
Monocytes Absolute: 1 10*3/uL (ref 0.1–1.0)
Monocytes Relative: 10 % (ref 3–12)
Neutro Abs: 6.7 10*3/uL (ref 1.7–7.7)
Neutrophils Relative %: 70 % (ref 43–77)
WBC Morphology: INCREASED

## 2011-05-21 LAB — CBC
HCT: 40.2 % (ref 36.0–46.0)
Hemoglobin: 13.9 g/dL (ref 12.0–15.0)
MCH: 31.6 pg (ref 26.0–34.0)
MCHC: 34.6 g/dL (ref 30.0–36.0)
MCV: 91.4 fL (ref 78.0–100.0)
Platelets: 196 10*3/uL (ref 150–400)
RBC: 4.4 MIL/uL (ref 3.87–5.11)
RDW: 13.4 % (ref 11.5–15.5)
WBC: 9.6 10*3/uL (ref 4.0–10.5)

## 2011-05-21 LAB — CARDIAC PANEL(CRET KIN+CKTOT+MB+TROPI)
CK, MB: 2.9 ng/mL (ref 0.3–4.0)
CK, MB: 1.7 ng/mL (ref 0.3–4.0)
Relative Index: 2.6 — ABNORMAL HIGH (ref 0.0–2.5)
Relative Index: 1.6 (ref 0.0–2.5)
Total CK: 111 U/L (ref 7–177)
Total CK: 107 U/L (ref 7–177)
Troponin I: <0.3 ng/mL (ref <0.30)
Troponin I: <0.3 ng/mL (ref <0.30)

## 2011-05-21 LAB — GLUCOSE, CAPILLARY: Glucose-Capillary: 282 mg/dL — ABNORMAL HIGH (ref 70–99)

## 2011-05-21 LAB — HEPATIC FUNCTION PANEL
ALT: 11 U/L (ref 0–35)
AST: 14 U/L (ref 0–37)
Albumin: 3.3 g/dL — ABNORMAL LOW (ref 3.5–5.2)
Alkaline Phosphatase: 81 U/L (ref 39–117)
Bilirubin, Direct: <0.1 mg/dL (ref 0.0–0.3)
Total Bilirubin: 0.4 mg/dL (ref 0.3–1.2)
Total Protein: 7.6 g/dL (ref 6.0–8.3)

## 2011-05-21 LAB — PRO B NATRIURETIC PEPTIDE: Pro B Natriuretic peptide (BNP): 1761 pg/mL — ABNORMAL HIGH (ref 0–125)

## 2011-05-21 LAB — TROPONIN I: Troponin I: <0.3 ng/mL (ref <0.30)

## 2011-05-21 MED ORDER — HYDRALAZINE HCL 20 MG/ML IJ SOLN
10.0000 mg | Freq: Four times a day (QID) | INTRAMUSCULAR | Status: DC | PRN
  Administered 2011-05-21: 10 mg via INTRAVENOUS
  Filled 2011-05-21: qty 0.5

## 2011-05-21 MED ORDER — ACETAMINOPHEN 325 MG PO TABS
650.0000 mg | ORAL_TABLET | Freq: Four times a day (QID) | ORAL | Status: DC | PRN
  Administered 2011-05-21: 650 mg via ORAL
  Filled 2011-05-21: qty 2

## 2011-05-21 MED ORDER — INSULIN GLARGINE 100 UNIT/ML ~~LOC~~ SOLN
24.0000 [IU] | Freq: Every day | SUBCUTANEOUS | Status: DC
  Administered 2011-05-21 – 2011-05-25 (×5): 24 [IU] via SUBCUTANEOUS

## 2011-05-21 MED ORDER — SODIUM CHLORIDE 0.9 % IV SOLN
INTRAVENOUS | Status: DC
  Administered 2011-05-21: 11:00:00 via INTRAVENOUS

## 2011-05-21 MED ORDER — ALENDRONATE SODIUM 70 MG PO TABS
70.0000 mg | ORAL_TABLET | ORAL | Status: DC
Start: 2011-05-21 — End: 2011-05-21

## 2011-05-21 MED ORDER — ALBUTEROL SULFATE (5 MG/ML) 0.5% IN NEBU
INHALATION_SOLUTION | RESPIRATORY_TRACT | Status: AC
  Administered 2011-05-21: 2.5 mg
  Filled 2011-05-21: qty 0.5

## 2011-05-21 MED ORDER — SODIUM CHLORIDE 0.9 % IJ SOLN
3.0000 mL | Freq: Two times a day (BID) | INTRAMUSCULAR | Status: DC
  Administered 2011-05-22 – 2011-05-26 (×9): 3 mL via INTRAVENOUS

## 2011-05-21 MED ORDER — ACETAMINOPHEN 650 MG RE SUPP: 650.0000 mg | Freq: Four times a day (QID) | RECTAL | Status: DC | PRN

## 2011-05-21 MED ORDER — IPRATROPIUM BROMIDE 0.02 % IN SOLN
0.5000 mg | Freq: Once | RESPIRATORY_TRACT | Status: AC
  Administered 2011-05-21: 0.5 mg via RESPIRATORY_TRACT
  Filled 2011-05-21: qty 2.5

## 2011-05-21 MED ORDER — FUROSEMIDE 10 MG/ML IJ SOLN
40.0000 mg | Freq: Once | INTRAMUSCULAR | Status: AC
  Administered 2011-05-21: 40 mg via INTRAVENOUS

## 2011-05-21 MED ORDER — FUROSEMIDE 10 MG/ML IJ SOLN
INTRAMUSCULAR | Status: AC
  Administered 2011-05-21: 40 mg via INTRAVENOUS
  Filled 2011-05-21: qty 4

## 2011-05-21 MED ORDER — ONDANSETRON HCL 4 MG/2ML IJ SOLN: 4.0000 mg | Freq: Four times a day (QID) | INTRAMUSCULAR | Status: DC | PRN

## 2011-05-21 MED ORDER — ALBUTEROL SULFATE (5 MG/ML) 0.5% IN NEBU
2.5000 mg | INHALATION_SOLUTION | RESPIRATORY_TRACT | Status: DC | PRN
Start: 2011-05-21 — End: 2011-05-26
  Filled 2011-05-21: qty 0.5

## 2011-05-21 MED ORDER — ACETAMINOPHEN 325 MG PO TABS
650.0000 mg | ORAL_TABLET | Freq: Once | ORAL | Status: AC
  Administered 2011-05-21: 650 mg via ORAL
  Filled 2011-05-21: qty 2

## 2011-05-21 MED ORDER — AMLODIPINE BESYLATE 10 MG PO TABS
10.0000 mg | ORAL_TABLET | Freq: Every day | ORAL | Status: DC
  Filled 2011-05-21: qty 1

## 2011-05-21 MED ORDER — LISINOPRIL 40 MG PO TABS
40.0000 mg | ORAL_TABLET | Freq: Every day | ORAL | Status: DC
  Filled 2011-05-21: qty 1

## 2011-05-21 MED ORDER — ALBUTEROL SULFATE (5 MG/ML) 0.5% IN NEBU
5.0000 mg | INHALATION_SOLUTION | Freq: Once | RESPIRATORY_TRACT | Status: AC
  Administered 2011-05-21: 5 mg via RESPIRATORY_TRACT
  Filled 2011-05-21: qty 1

## 2011-05-21 MED ORDER — SODIUM CHLORIDE 0.9 % IV SOLN: 250.0000 mL | INTRAVENOUS | Status: DC | PRN

## 2011-05-21 MED ORDER — ENOXAPARIN SODIUM 40 MG/0.4ML ~~LOC~~ SOLN
40.0000 mg | SUBCUTANEOUS | Status: DC
  Administered 2011-05-21 – 2011-05-25 (×5): 40 mg via SUBCUTANEOUS
  Filled 2011-05-21 (×6): qty 0.4

## 2011-05-21 MED ORDER — MOXIFLOXACIN HCL IN NACL 400 MG/250ML IV SOLN
400.0000 mg | Freq: Every day | INTRAVENOUS | Status: DC
  Administered 2011-05-21 – 2011-05-25 (×4): 400 mg via INTRAVENOUS
  Filled 2011-05-21 (×4): qty 250

## 2011-05-21 MED ORDER — FUROSEMIDE 10 MG/ML IJ SOLN
40.0000 mg | Freq: Two times a day (BID) | INTRAMUSCULAR | Status: DC
  Administered 2011-05-21 – 2011-05-22 (×2): 40 mg via INTRAVENOUS
  Filled 2011-05-21 (×4): qty 4

## 2011-05-21 MED ORDER — ASPIRIN EC 81 MG PO TBEC
81.0000 mg | DELAYED_RELEASE_TABLET | Freq: Every day | ORAL | Status: DC
  Filled 2011-05-21: qty 1

## 2011-05-21 MED ORDER — METOPROLOL SUCCINATE ER 50 MG PO TB24
50.0000 mg | ORAL_TABLET | Freq: Every day | ORAL | Status: DC
  Filled 2011-05-21: qty 1

## 2011-05-21 MED ORDER — GABAPENTIN 300 MG PO CAPS
300.0000 mg | ORAL_CAPSULE | Freq: Three times a day (TID) | ORAL | Status: DC
  Administered 2011-05-21 – 2011-05-26 (×15): 300 mg via ORAL
  Filled 2011-05-21 (×17): qty 1

## 2011-05-21 MED ORDER — SODIUM CHLORIDE 0.9 % IJ SOLN
3.0000 mL | Freq: Two times a day (BID) | INTRAMUSCULAR | Status: DC
  Administered 2011-05-21 – 2011-05-25 (×4): 3 mL via INTRAVENOUS

## 2011-05-21 MED ORDER — ONDANSETRON HCL 4 MG PO TABS: 4.0000 mg | ORAL_TABLET | Freq: Four times a day (QID) | ORAL | Status: DC | PRN

## 2011-05-21 MED ORDER — METOPROLOL SUCCINATE ER 50 MG PO TB24
50.0000 mg | ORAL_TABLET | Freq: Every day | ORAL | Status: DC
  Administered 2011-05-21 – 2011-05-26 (×6): 50 mg via ORAL
  Filled 2011-05-21 (×6): qty 1

## 2011-05-21 MED ORDER — SODIUM CHLORIDE 0.9 % IJ SOLN: 3.0000 mL | INTRAMUSCULAR | Status: DC | PRN

## 2011-05-21 MED ORDER — INSULIN ASPART 100 UNIT/ML ~~LOC~~ SOLN
4.0000 [IU] | Freq: Three times a day (TID) | SUBCUTANEOUS | Status: DC
  Administered 2011-05-22 (×2): 4 [IU] via SUBCUTANEOUS

## 2011-05-21 MED ORDER — ALBUTEROL SULFATE (5 MG/ML) 0.5% IN NEBU
2.5000 mg | INHALATION_SOLUTION | Freq: Three times a day (TID) | RESPIRATORY_TRACT | Status: DC
  Administered 2011-05-21 – 2011-05-22 (×2): 2.5 mg via RESPIRATORY_TRACT
  Filled 2011-05-21 (×2): qty 0.5

## 2011-05-21 MED ORDER — SIMVASTATIN 20 MG PO TABS
20.0000 mg | ORAL_TABLET | Freq: Every day | ORAL | Status: DC
  Administered 2011-05-21 – 2011-05-25 (×5): 20 mg via ORAL
  Filled 2011-05-21 (×6): qty 1

## 2011-05-21 MED ORDER — GUAIFENESIN ER 600 MG PO TB12
1200.0000 mg | ORAL_TABLET | Freq: Two times a day (BID) | ORAL | Status: DC
  Administered 2011-05-21 – 2011-05-26 (×10): 1200 mg via ORAL
  Filled 2011-05-21 (×11): qty 2

## 2011-05-21 MED ORDER — INSULIN ASPART 100 UNIT/ML ~~LOC~~ SOLN
0.0000 [IU] | Freq: Three times a day (TID) | SUBCUTANEOUS | Status: DC
  Administered 2011-05-21: 8 [IU] via SUBCUTANEOUS
  Administered 2011-05-22 (×3): 5 [IU] via SUBCUTANEOUS
  Administered 2011-05-23: 3 [IU] via SUBCUTANEOUS
  Administered 2011-05-23: 5 [IU] via SUBCUTANEOUS
  Administered 2011-05-24: 2 [IU] via SUBCUTANEOUS
  Administered 2011-05-24: 8 [IU] via SUBCUTANEOUS
  Administered 2011-05-24: 15 [IU] via SUBCUTANEOUS
  Administered 2011-05-25: 8 [IU] via SUBCUTANEOUS
  Administered 2011-05-25: 5 [IU] via SUBCUTANEOUS
  Administered 2011-05-25: 2 [IU] via SUBCUTANEOUS

## 2011-05-21 MED ORDER — PAROXETINE HCL 20 MG PO TABS
20.0000 mg | ORAL_TABLET | Freq: Every day | ORAL | Status: DC
  Filled 2011-05-21: qty 1

## 2011-05-21 MED ORDER — AMLODIPINE BESYLATE 10 MG PO TABS
10.0000 mg | ORAL_TABLET | Freq: Every day | ORAL | Status: DC
  Administered 2011-05-21 – 2011-05-26 (×6): 10 mg via ORAL
  Filled 2011-05-21 (×6): qty 1

## 2011-05-21 MED ORDER — ASPIRIN EC 81 MG PO TBEC
81.0000 mg | DELAYED_RELEASE_TABLET | Freq: Every day | ORAL | Status: DC
  Administered 2011-05-21 – 2011-05-26 (×6): 81 mg via ORAL
  Filled 2011-05-21 (×6): qty 1

## 2011-05-21 MED ORDER — ASPIRIN 81 MG PO TABS: 81.0000 mg | ORAL_TABLET | Freq: Every day | ORAL | Status: DC

## 2011-05-21 MED ORDER — LISINOPRIL 40 MG PO TABS
40.0000 mg | ORAL_TABLET | Freq: Every day | ORAL | Status: DC
  Administered 2011-05-21 – 2011-05-26 (×6): 40 mg via ORAL
  Filled 2011-05-21 (×6): qty 1

## 2011-05-21 MED ORDER — PAROXETINE HCL 20 MG PO TABS
20.0000 mg | ORAL_TABLET | Freq: Every day | ORAL | Status: DC
  Administered 2011-05-21 – 2011-05-26 (×6): 20 mg via ORAL
  Filled 2011-05-21 (×6): qty 1

## 2011-05-21 NOTE — Progress Notes (Signed)
PHARMACIST - PHYSICIAN COMMUNICATION  CONCERNING: P&T Medication Policy Regarding Oral Bisphosphonates  RECOMMENDATION: Your order for alendronate (Fosamax), ibandronate (Boniva), or risedronate (Actonel) has been discontinued at this time.  If the patient's post-hospital medical condition warrants safe use of this class of drugs, please resume the pre-hospital regimen upon discharge.  DESCRIPTION:  Alendronate (Fosamax), ibandronate (Boniva), and risedronate (Actonel) can cause severe esophageal erosions in patients who are unable to remain upright at least 30 minutes after taking this medication.   Since brief interruptions in therapy are thought to have minimal impact on bone mineral density, the [facility] has established that bisphosphonate orders should be routinely discontinued during hospitalization.   To override this safety policy and permit administration of Boniva, Fosamax, or Actonel in the hospital, prescribers must write "DO NOT HOLD" in the comments section when placing the order for this class of medications.  Thank You,   Erin Hearing PharmD
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
## 2011-05-21 NOTE — ED Provider Notes (Signed)
History     CSN: [id number]  Arrival date & time 05/21/11  9484   First MD Initiated Contact with Patient 05/21/11 1015      Chief Complaint  Patient presents with  . Shortness of Breath  . Cough    HPI Pt was seen at 1040.  Per pt, c/o gradual onset and worsening of constant SOB, wheezing, cough, generalized fatigue/weakness for the past 2 weeks, worse over past several days.  +multiple sick contacts in household with URI symptoms.  Denies CP/palpitations, no fevers, no abd pain, no N/V/D, no back pain.     PMD:  [facility] Past Medical History  Diagnosis Date  . CAD (coronary artery disease) 2006    Cath 5/06 50% stenosis of RCA. s/p CABG (5/06) w/ saphenous vein to RCA. Treated with statin, BB, & ASA.   Marland Kitchen Aortic stenosis, severe 2006    s/p aortic valve replacement with porcine valve 06/2004. ECHO 2010 EF 65%, LVH, diastolic dysfxn, Bioprostetic aoritc valve, mild AS.  Marland Kitchen CVA (cerebral infarction) 2006    Post-op from AVR. Presumed embolic in nature. Carotid stenosis of R 60-79%. Repeat dopplers 4/10 no R stenosis and L stenosis of 1-29%.  . Hyperlipidemia     Mgmt with a statin  . Refusal of blood transfusions as patient is Jehovah's Witness   . Diverticulosis 2001  . Adenocarcinoma of breast 1996    Completed tamoxifen and had mastectomy.  . Osteoporosis 2006    DEXA 10/06 : L femur T -2.8, R -2.7. Lumbar T -2.4. On bisphosphonates and  Calcium / Vit D.  . Hypertension     Requires 4 drug tx  . Diabetes mellitus     Dx 04/25/1990.     Past Surgical History  Procedure Date  . Cholecystectomy   . Mastectomy 1995    L for adenocarcinoma  . Abdominal hysterectomy 1987    for fibroids  . Coronary artery bypass graft 2006    Saphenous vein to RCA  . Aortic valve replacement 2006    Family History  Problem Relation Age of Onset  . Diabetes Mother   . Hypertension Mother   . Alzheimer's disease Mother   . Heart disease Father 66    AMI at age 57 and 98  . Mental illness  Sister   . Heart disease Sister 2    AMI  . Kidney disease Sister     History  Substance Use Topics  . Smoking status: Never Smoker   . Smokeless tobacco: Never Used  . Alcohol Use: No     Occassion beer    Review of Systems ROS: Statement: All systems negative except as marked or noted in the HPI; Constitutional: Negative for fever and chills. ; ; Eyes: Negative for eye pain, redness and discharge. ; ; ENMT: Negative for ear pain, hoarseness, nasal congestion, sinus pressure and sore throat. ; ; Cardiovascular: Negative for chest pain, palpitations, diaphoresis, and peripheral edema. ; ; Respiratory: +cough, wheezing, SOB.  Negative for stridor. ; ; Gastrointestinal: Negative for nausea, vomiting, diarrhea, abdominal pain, blood in stool, hematemesis, jaundice and rectal bleeding. . ; ; Genitourinary: Negative for dysuria, flank pain and hematuria. ; ; Musculoskeletal: Negative for back pain and neck pain. Negative for swelling and trauma.; ; Skin: Negative for pruritus, rash, abrasions, blisters, bruising and skin lesion.; ; Neuro: +generalized weakness. Negative for headache, lightheadedness and neck stiffness. Negative for altered level of consciousness , altered mental status, extremity weakness, paresthesias, involuntary movement, seizure  and syncope.     Allergies  Review of patient's allergies indicates no known allergies.  Home Medications   Current Outpatient Rx  Name Route Sig Dispense Refill  . ALENDRONATE SODIUM 70 MG PO TABS Oral Take 70 mg by mouth every 7 (seven) days. Take with a full glass of water on an empty stomach. Takes on Sundays     . AMLODIPINE BESYLATE 10 MG PO TABS Oral Take 1 tablet (10 mg total) by mouth daily. 30 tablet 11  . ASPIRIN 81 MG PO TABS Oral Take 81 mg by mouth daily.      Marland Kitchen GABAPENTIN 300 MG PO CAPS Oral Take 1 capsule (300 mg total) by mouth 3 (three) times daily. 90 capsule 11  . GUAIFENESIN ER 600 MG PO TB12 Oral Take 2 tablets (1,200 mg  total) by mouth 2 (two) times daily. 60 tablet 1  . HYDROCHLOROTHIAZIDE 25 MG PO TABS Oral Take 1 tablet (25 mg total) by mouth daily. 30 tablet 11  . IBUPROFEN 200 MG PO TABS Oral Take 400 mg by mouth every 6 (six) hours as needed. For pain    . INSULIN ASPART 100 UNIT/ML [place] SOLN Subcutaneous Inject 8 Units into the skin 3 (three) times daily before meals. Inject 8 units before breakfast and lunch and dinner     . INSULIN GLARGINE 100 UNIT/ML [place] SOLN Subcutaneous Inject 24 Units into the skin at bedtime. 9 mL 11  . LISINOPRIL 40 MG PO TABS Oral Take 1 tablet (40 mg total) by mouth daily. 30 tablet 11  . METFORMIN HCL 1000 MG PO TABS Oral Take 1 tablet (1,000 mg total) by mouth 2 (two) times daily with a meal. 64 tablet 6  . METOPROLOL SUCCINATE ER 50 MG PO TB24 Oral Take 1 tablet (50 mg total) by mouth daily. 30 tablet 11  . PAROXETINE HCL 20 MG PO TABS Oral Take 20 mg by mouth every morning.    Marland Kitchen GAS RELIEF PO Oral Take 2 tablets by mouth daily as needed. For gas    . SIMVASTATIN 20 MG PO TABS Oral Take 1 tablet (20 mg total) by mouth at bedtime. 30 tablet 11  . PEN NEEDLES 3/16" 31G X 5 MM MISC Does not apply 1 each by Does not apply route 4 (four) times daily. 100 each 11  . FREESTYLE LANCETS MISC  Use as instructed 100 each 11    BP 142/66  Pulse 75  Temp(Src) 99 F (37.2 C) (Oral)  Resp 20  SpO2 91%  Physical Exam 1030: Physical examination:  Nursing notes reviewed; Vital signs and O2 SAT reviewed;  Constitutional: Well developed, Well nourished, In no acute distress; Head:  Normocephalic, atraumatic; Eyes: EOMI, PERRL, No scleral icterus; ENMT: Mouth and pharynx normal, Mucous membranes dry; Neck: Supple, Full range of motion, No lymphadenopathy; Cardiovascular: Regular rate and rhythm, No murmur, rub, or gallop; Respiratory: Breath sounds coarse & equal bilaterally, +bibasilar crackles and scattered wheezes.  No audible wheezing.  No retrax or access mm use. Normal respiratory  effort/excursion; Chest: Nontender, Movement normal; Abdomen: Soft, Nontender, Nondistended, Normal bowel sounds; Extremities: Pulses normal, No tenderness, No edema, No calf edema or asymmetry.; Neuro: AA&Ox3, Major CN grossly intact. Speech clear. No gross focal motor or sensory deficits in extremities.; Skin: Color normal, Warm, Dry, no rash.    ED Course  Procedures   MDM  MDM Reviewed: nursing note, vitals and previous chart Reviewed previous: ECG Interpretation: ECG, labs and x-ray  Date: 05/21/2011  Rate: 75  Rhythm: normal sinus rhythm  QRS Axis: left  Intervals: PR prolonged  ST/T Wave abnormalities: nonspecific ST/T changes  Conduction Disutrbances:left bundle branch block  Narrative Interpretation:   Old EKG Reviewed: unchanged; no significant changes from previous EKG dated 01/25/2011.  Results for orders placed during the hospital encounter of 05/21/11  CBC      Component Value Range   WBC 9.6  4.0 - 10.5 (K/uL)   RBC 4.40  3.87 - 5.11 (MIL/uL)   Hemoglobin 13.9  12.0 - 15.0 (g/dL)   HCT 40.2  36.0 - 46.0 (%)   MCV 91.4  78.0 - 100.0 (fL)   MCH 31.6  26.0 - 34.0 (pg)   MCHC 34.6  30.0 - 36.0 (g/dL)   RDW 13.4  11.5 - 15.5 (%)   Platelets 196  150 - 400 (K/uL)  DIFFERENTIAL      Component Value Range   Neutrophils Relative 70  43 - 77 (%)   Lymphocytes Relative 18  12 - 46 (%)   Monocytes Relative 10  3 - 12 (%)   Eosinophils Relative 2  0 - 5 (%)   Basophils Relative 0  0 - 1 (%)   Neutro Abs 6.7  1.7 - 7.7 (K/uL)   Lymphs Abs 1.7  0.7 - 4.0 (K/uL)   Monocytes Absolute 1.0  0.1 - 1.0 (K/uL)   Eosinophils Absolute 0.2  0.0 - 0.7 (K/uL)   Basophils Absolute 0.0  0.0 - 0.1 (K/uL)   WBC Morphology INCREASED BANDS (>20% BANDS)    BASIC METABOLIC PANEL      Component Value Range   Sodium 138  135 - 145 (mEq/L)   Potassium 3.8  3.5 - 5.1 (mEq/L)   Chloride 102  96 - 112 (mEq/L)   CO2 25  19 - 32 (mEq/L)   Glucose, Bld 235 (*) 70 - 99 (mg/dL)   BUN 12  6 -  23 (mg/dL)   Creatinine, Ser 0.88  0.50 - 1.10 (mg/dL)   Calcium 9.4  8.4 - 10.5 (mg/dL)   GFR calc non Af Amer 66 (*) >90 (mL/min)   GFR calc Af Amer 77 (*) >90 (mL/min)  TROPONIN I      Component Value Range   Troponin I <0.30  <0.30 (ng/mL)  PRO B NATRIURETIC PEPTIDE      Component Value Range   Pro B Natriuretic peptide (BNP) 1761.0 (*) 0 - 125 (pg/mL)   Dg Chest 2 View 05/21/2011  *RADIOLOGY REPORT*  Clinical Data: Shortness of breath, chest heaviness, productive cough.  CHEST - 2 VIEW  Comparison: 03/20/2011.  Findings: Trachea is midline.  Heart size is stable.  There is mild diffuse interstitial prominence and indistinctness with air space disease at the lung bases.  No pleural fluid.  IMPRESSION: Probable pulmonary edema with mild bibasilar air space disease.  Original Report Authenticated By: Luretha Rued, M.D.      1145:  Lungs continue coarse bilat after neb and Sats have increased from 84% R/A to 91-93% after neb on O2 N/C.  Pt states she feels "better."  Does appear more comfortable.  BNP elevated compared to previous in 2010, CXR with pulm vasc congestion; will dose IV lasix and ntg for CHF.  Normal WBC count, afebrile, and no clear infiltrate on CXR; will hold abx at this time.  Dx testing d/w pt and family.  Questions answered.  Verb understanding, agreeable to admit.  T/C to [facility], case discussed,  including:  HPI, pertinent PM/SHx, VS/PE, dx testing, ED course and treatment:  Agreeable to admit.          Alfonzo Feller, DO 05/22/11 1807

## 2011-05-21 NOTE — Progress Notes (Signed)
RN made aware that pt's temp is 101.2 F. MD made aware. New orders given. Will continue to monitor.

## 2011-05-21 NOTE — ED Notes (Signed)
Report given to Greg, RN.

## 2011-05-21 NOTE — ED Notes (Signed)
Pt states that for the past week or so she has been having increasing shortness of breath. She states that she has been having chills and sweats. She states that she has been nauseated with generalized malaise. She states that when she coughs she has some greenish colored sputum. Pt has rales and rhonchi with some mild wheezing at the bases bilaterally. No meds pta.

## 2011-05-21 NOTE — ED Notes (Signed)
Foley inserted.  Nothing remarkable during insertion.  Urine cloudy.

## 2011-05-21 NOTE — ED Notes (Signed)
Patient resting comfortably on stretcher. 2 family members at bedside.

## 2011-05-21 NOTE — Progress Notes (Signed)
Pt transferred to 4702 via wheelchair from ED. Pt A&O. Complained of headache but denied pain meds. PIV to R AC intact. 3.5L O2 via [place]. Assessment completed. Oriented pt to room and placed call bell in reach. Pt denied concerns BP elevated (see doc flowsheet). Will give scheduled BP meds and notify MD. Will continue to monitor pt closely. Report received from [facility] via telephone.

## 2011-05-22 ENCOUNTER — Other Ambulatory Visit: Payer: Self-pay

## 2011-05-22 DIAGNOSIS — I359 Nonrheumatic aortic valve disorder, unspecified: Secondary | ICD-10-CM | POA: Diagnosis not present

## 2011-05-22 DIAGNOSIS — R0989 Other specified symptoms and signs involving the circulatory and respiratory systems: Secondary | ICD-10-CM | POA: Diagnosis not present

## 2011-05-22 DIAGNOSIS — J189 Pneumonia, unspecified organism: Secondary | ICD-10-CM | POA: Diagnosis not present

## 2011-05-22 DIAGNOSIS — R0609 Other forms of dyspnea: Secondary | ICD-10-CM | POA: Diagnosis not present

## 2011-05-22 LAB — CBC
HCT: 40.9 % (ref 36.0–46.0)
Hemoglobin: 13.6 g/dL (ref 12.0–15.0)
MCH: 30.8 pg (ref 26.0–34.0)
MCHC: 33.3 g/dL (ref 30.0–36.0)
MCV: 92.5 fL (ref 78.0–100.0)
Platelets: 198 10*3/uL (ref 150–400)
RBC: 4.42 MIL/uL (ref 3.87–5.11)
RDW: 13.8 % (ref 11.5–15.5)
WBC: 13.7 10*3/uL — ABNORMAL HIGH (ref 4.0–10.5)

## 2011-05-22 LAB — URINE MICROSCOPIC-ADD ON

## 2011-05-22 LAB — GLUCOSE, CAPILLARY
Glucose-Capillary: 257 mg/dL — ABNORMAL HIGH (ref 70–99)
Glucose-Capillary: 214 mg/dL — ABNORMAL HIGH (ref 70–99)
Glucose-Capillary: 221 mg/dL — ABNORMAL HIGH (ref 70–99)
Glucose-Capillary: 221 mg/dL — ABNORMAL HIGH (ref 70–99)
Glucose-Capillary: 132 mg/dL — ABNORMAL HIGH (ref 70–99)

## 2011-05-22 LAB — URINALYSIS, ROUTINE W REFLEX MICROSCOPIC
Bilirubin Urine: NEGATIVE
Glucose, UA: NEGATIVE mg/dL
Ketones, ur: NEGATIVE mg/dL
Leukocytes, UA: NEGATIVE
Nitrite: NEGATIVE
Protein, ur: >300 mg/dL — AB
Specific Gravity, Urine: 1.023 (ref 1.005–1.030)
Urobilinogen, UA: 1 mg/dL (ref 0.0–1.0)
pH: 5.5 (ref 5.0–8.0)

## 2011-05-22 LAB — LEGIONELLA ANTIGEN, URINE: Legionella Antigen, Urine: NEGATIVE

## 2011-05-22 LAB — BASIC METABOLIC PANEL
BUN: 19 mg/dL (ref 6–23)
CO2: 26 meq/L (ref 19–32)
Calcium: 9.2 mg/dL (ref 8.4–10.5)
Chloride: 98 mEq/L (ref 96–112)
Creatinine, Ser: 1.8 mg/dL — ABNORMAL HIGH (ref 0.50–1.10)
GFR calc Af Amer: 33 mL/min — ABNORMAL LOW (ref >90)
GFR calc non Af Amer: 28 mL/min — ABNORMAL LOW (ref >90)
Glucose, Bld: 227 mg/dL — ABNORMAL HIGH (ref 70–99)
Potassium: 3.7 meq/L (ref 3.5–5.1)
Sodium: 139 meq/L (ref 135–145)

## 2011-05-22 LAB — STREP PNEUMONIAE URINARY ANTIGEN: Strep Pneumo Urinary Antigen: NEGATIVE

## 2011-05-22 LAB — CARDIAC PANEL(CRET KIN+CKTOT+MB+TROPI)
CK, MB: 1.5 ng/mL (ref 0.3–4.0)
Relative Index: INVALID (ref 0.0–2.5)
Total CK: 97 U/L (ref 7–177)
Troponin I: <0.3 ng/mL (ref <0.30)

## 2011-05-22 MED ORDER — INSULIN ASPART 100 UNIT/ML ~~LOC~~ SOLN
6.0000 [IU] | Freq: Three times a day (TID) | SUBCUTANEOUS | Status: DC
  Administered 2011-05-22 – 2011-05-25 (×9): 6 [IU] via SUBCUTANEOUS

## 2011-05-22 MED ORDER — BIOTENE DRY MOUTH MT LIQD
15.0000 mL | Freq: Two times a day (BID) | OROMUCOSAL | Status: DC
  Administered 2011-05-22 – 2011-05-26 (×6): 15 mL via OROMUCOSAL

## 2011-05-22 MED ORDER — IPRATROPIUM BROMIDE 0.02 % IN SOLN
0.5000 mg | RESPIRATORY_TRACT | Status: DC
  Administered 2011-05-22 – 2011-05-24 (×14): 0.5 mg via RESPIRATORY_TRACT
  Filled 2011-05-22 (×14): qty 2.5

## 2011-05-22 MED ORDER — BIOTENE DRY MOUTH MT LIQD: 15.0000 mL | Freq: Two times a day (BID) | OROMUCOSAL | Status: DC

## 2011-05-22 MED ORDER — ALBUTEROL SULFATE (5 MG/ML) 0.5% IN NEBU
2.5000 mg | INHALATION_SOLUTION | RESPIRATORY_TRACT | Status: DC
  Administered 2011-05-22 – 2011-05-24 (×14): 2.5 mg via RESPIRATORY_TRACT
  Filled 2011-05-22 (×14): qty 0.5

## 2011-05-22 NOTE — Progress Notes (Signed)
Subjective: Patient states that she still c/o SOB at rest and exertion. She spiked fever 101.2 last night.   Objective: Vital signs in last 24 hours: Filed Vitals:   05/22/11 0201 05/22/11 0704 05/22/11 0905 05/22/11 1019  BP: 134/55 120/51  130/59  Pulse: 87 70    Temp: 99.8 F (37.7 C) 100.5 F (38.1 C)    TempSrc: Oral Oral    Resp: 23 18    Height:      Weight:  166 lb 6.4 oz (75.479 kg)    SpO2: 97% 95% 92%    Weight change:   Intake/Output Summary (Last 24 hours) at 05/22/11 1314 Last data filed at 05/22/11 1100  Gross per 24 hour  Intake    737 ml  Output   2625 ml  Net  -1888 ml   Physical Exam: Gen.: NAD, acute ill appearing Lungs: Bilateral rhonchi and basilar crackles up to mid lungs. Occasional wheezing.  Heart: Regular rate and rhythm with a 2/6 harsh systolic murmur in the left upper sternal border without radiation. The S2 is loud and physiologically split. No gallops were appreciated.  Abdomen: Soft, non tender, active bowel sounds without masses.  Extremities: 1-2+ pitting edema   Lab Results: Basic Metabolic Panel:  Lab 30/16/01 0650 05/21/11 0925  [name] 139 138  K 3.7 3.8  CL 98 102  CO2 26 25  GLUCOSE 227* 235*  BUN 19 12  CREATININE 1.80* 0.88  CALCIUM 9.2 9.4  MG -- --  PHOS -- --   Liver Function Tests:  Lab 05/21/11 1503  AST 14  ALT 11  ALKPHOS 81  BILITOT 0.4  PROT 7.6  ALBUMIN 3.3*   CBC:  Lab 05/22/11 0650 05/21/11 0925  WBC 13.7* 9.6  NEUTROABS -- 6.7  HGB 13.6 13.9  HCT 40.9 40.2  MCV 92.5 91.4  PLT 198 196   Cardiac Enzymes:  Lab 05/22/11 0650 05/21/11 2232 05/21/11 1502  CKTOTAL 97 107 111  CKMB 1.5 1.7 2.9  CKMBINDEX -- -- --  TROPONINI <0.30 <0.30 <0.30   BNP:  Lab 05/21/11 0930  PROBNP 1761.0*   CBG:  Lab 05/22/11 1138 05/22/11 0701 05/21/11 2143 05/21/11 1634  GLUCAP 221* 214* 257* 282*   Urinalysis:  Lab 05/22/11 0226  COLORURINE YELLOW  LABSPEC 1.023  PHURINE 5.5  GLUCOSEU NEGATIVE  HGBUR  SMALL*  BILIRUBINUR NEGATIVE  KETONESUR NEGATIVE  PROTEINUR >300*  UROBILINOGEN 1.0  NITRITE NEGATIVE  LEUKOCYTESUR NEGATIVE   : Dg Chest 2 View  05/21/2011  *RADIOLOGY REPORT*  Clinical Data: Shortness of breath, chest heaviness, productive cough.  CHEST - 2 VIEW  Comparison: 03/20/2011.  Findings: Trachea is midline.  Heart size is stable.  There is mild diffuse interstitial prominence and indistinctness with air space disease at the lung bases.  No pleural fluid.  IMPRESSION: Probable pulmonary edema with mild bibasilar air space disease.  Original Report Authenticated By: Luretha Rued, M.D.   Medications: I have reviewed the patient's current medications. Scheduled Meds:    . albuterol  2.5 mg Nebulization Q4H  . albuterol      . amLODipine  10 mg Oral Daily  . antiseptic oral rinse  15 mL Mouth Rinse BID  . aspirin EC  81 mg Oral Daily  . enoxaparin  40 mg Subcutaneous Q24H  . gabapentin  300 mg Oral TID  . guaiFENesin  1,200 mg Oral BID  . insulin aspart  0-15 Units Subcutaneous TID WC  . insulin aspart  6 Units Subcutaneous TID WC  . insulin glargine  24 Units Subcutaneous QHS  . ipratropium  0.5 mg Nebulization Q4H  . lisinopril  40 mg Oral Daily  . metoprolol succinate  50 mg Oral Daily  . moxifloxacin  400 mg Intravenous QHS  . PARoxetine  20 mg Oral Daily  . simvastatin  20 mg Oral QHS  . sodium chloride  3 mL Intravenous Q12H  . sodium chloride  3 mL Intravenous Q12H  . DISCONTD: albuterol  2.5 mg Nebulization TID  . DISCONTD: alendronate  70 mg Oral Q7 days  . DISCONTD: amLODipine  10 mg Oral Daily  . DISCONTD: aspirin EC  81 mg Oral Daily  . DISCONTD: aspirin  81 mg Oral Daily  . DISCONTD: furosemide  40 mg Intravenous BID  . DISCONTD: insulin aspart  4 Units Subcutaneous TID WC  . DISCONTD: lisinopril  40 mg Oral Daily  . DISCONTD: metoprolol succinate  50 mg Oral Daily  . DISCONTD: PARoxetine  20 mg Oral Daily   Continuous Infusions:    .  DISCONTD: sodium chloride 75 mL/hr at 05/21/11 1038   PRN Meds:.sodium chloride, acetaminophen, acetaminophen, albuterol, hydrALAZINE, ondansetron (ZOFRAN) IV, ondansetron, sodium chloride Assessment/Plan:  #  Atypical CAP:      Progressive productive cough and SOB along with fever, leukocytosis.  The clinical manifestation is consistent with atypical CAP  - Oxygen therapy - BD - ABX>>>Avelox - anti cough - Blood Cx pending   # Acute renal insufficiency:   Iatrogenic 2/2 diuretics as the initial diagnosis was felt to be decompensated diastolic heart failure.   -stop lasix - will not give her IVF due to diastolic dysfunction>>>echo pending - repeat BMP in am  #. HTN   - continue home medication   #. DM  - A1C 7.1  - SSI  -increase meal coverage -carb modified diet  #. AS s/p bovine valve replacement  - patient is not on any anticoagulation   #. HLD  - continue statin  - LDL 66   #. Stroke  - continue aspirin   #. DVT PPX - lovenox       LOS: 1 day   [name] 05/22/2011, 1:14 PM

## 2011-05-22 NOTE — H&P (Addendum)
Internal Medicine Attending Admission Note Date: 05/22/2011  Patient name: [name] Medical record number: [id number] Date of birth: 1945/06/09 Age: 66 y.o. Gender: female  I saw and evaluated the patient. I reviewed the resident's note and I agree with the resident's findings and plan as documented in the resident's note.  Chief Complaint(s):  Progressive dyspnea on exertion and cough.  History - key components related to admission:  [name] is a 66 year old woman with a history of coronary artery disease status post CABG, diastolic dysfunction, diabetes, hypertension, hyperlipidemia, adenocarcinoma of the left breast status post mastectomy, and severe aortic stenosis status post a porcine valve replacement in 2006 who presents with a two-week history of progressive dyspnea on exertion. Initially she had dyspnea with exertion, but this progressed to shortness of breath even at rest. She notes concomitant orthopnea but denies paroxysmal nocturnal dyspnea. She states she is awoken several times a night but this is usually with a cough productive of brown sputum. She states she has lower extremity edema but this is at baseline. She denies any chest pain, fevers, chills, or recent sick contacts. She has had some night sweats. She's not taking any of her hydrochlorothiazide for the past week because it causes her to urinate too much and makes it difficult when she is out and about to reach the bathroom in time. She is without other acute complaints at this time.  Physical Exam - key components related to admission:  Filed Vitals:   05/22/11 0201 05/22/11 0704 05/22/11 0905 05/22/11 1019  BP: 134/55 120/51  130/59  Pulse: 87 70    Temp: 99.8 F (37.7 C) 100.5 F (38.1 C)    TempSrc: Oral Oral    Resp: 23 18    Height:      Weight:  166 lb 6.4 oz (75.479 kg)    SpO2: 97% 95% 92%    Gen.: Well-developed, well-nourished, woman lying comfortably in bed at 45 in no acute distress. Lungs:  Bilateral rhonchi and basilar crackles with a prolonged expiratory phase. Heart: Regular rate and rhythm with a 2/6 harsh systolic murmur in the left upper sternal border without radiation. The S2 is loud and physiologically split. No gallops were appreciated.  Abdomen: Soft, non tender, active bowel sounds without masses. Extremities: No edema at this time.  Lab results:  Basic Metabolic Panel:  Basename 05/22/11 0650 05/21/11 0925  NA 139 138  K 3.7 3.8  CL 98 102  CO2 26 25  GLUCOSE 227* 235*  BUN 19 12  CREATININE 1.80* 0.88  CALCIUM 9.2 9.4  MG -- --  PHOS -- --   Liver Function Tests:  [facility] 05/21/11 1503  AST 14  ALT 11  ALKPHOS 81  BILITOT 0.4  PROT 7.6  ALBUMIN 3.3*   CBC:  Basename 05/22/11 0650 05/21/11 0925  WBC 13.7* 9.6  NEUTROABS -- 6.7  HGB 13.6 13.9  HCT 40.9 40.2  MCV 92.5 91.4  PLT 198 196   Cardiac Enzymes:  Basename 05/22/11 0650 05/21/11 2232 05/21/11 1502  CKTOTAL 97 107 111  CKMB 1.5 1.7 2.9  CKMBINDEX -- -- --  TROPONINI <0.30 <0.30 <0.30   CBG:  Basename 05/22/11 0701 05/21/11 2143 05/21/11 1634  GLUCAP 214* 257* 282*   Misc. Labs:  BNP 1761 Weight 166 pounds down from 173 pounds in clinic in January.  Imaging results:  Dg Chest 2 View  05/21/2011  *RADIOLOGY REPORT*  Clinical Data: Shortness of breath, chest heaviness, productive cough.  CHEST - 2 VIEW  Comparison: 03/20/2011.  Findings: Trachea is midline.  Heart size is stable.  There is mild diffuse interstitial prominence and indistinctness with air space disease at the lung bases.  No pleural fluid.  IMPRESSION: Probable pulmonary edema with mild bibasilar air space disease.  Original Report Authenticated By: Luretha Rued, M.D.   Other results:  EKG: Normal sinus rhythm at 75 beats per minute, first degree AV block, left bundle-branch block, lateral T wave inversions, ST elevation in V1 through V4 all unchanged from the previous ECG on 01/25/2011.  Assessment &  Plan by Problem:  [name] is a 66 year old woman with a history of coronary artery disease status post CABG, diastolic dysfunction, aortic stenosis status post a porcine valve replacement in 2006, and adenocarcinoma of the left breast status post mastectomy who presents with progressive shortness of breath over the last 2 weeks.  Although she had signs and symptoms that were consistent with congestive heart failure her initial hospital course has not been consistent with this process. She has now had fevers, developed a white count, had worsening renal function with diuresis, and is at a lower weight than she was in January when seen in clinic. All of this would suggest she does not have heart failure but, in fact, may have an atypical pneumonia with progressive dyspnea.  1) Atypical community acquired pneumonia: I agree with the housestaff's plan to continue with the Avelox IV antibiotic therapy. We'll also provide her with albuterol bronchodilatation in order to improve her pulmonary function and increase her mucociliary clearance.  [name] has acute respiratory failure secondary to an acute community acquired pneumonia.  2) Acute renal insufficiency: This is most likely iatrogenic as the initial diagnosis was felt to be decompensated diastolic heart failure. With diuresis her renal function has worsened. We will stop her diuresis but not give IV fluids at this point given her underlying diastolic dysfunction. She requires an echocardiogram to assess her porcine aortic valve given that it is 68 years old and is associated with a murmur. Also with the Echo we will be able to better assess her left ventricular function as well.  3) Disposition: I suspect [name] will be here at least 2-3 days for treatment of this community acquired pneumonia and further evaluation of her cardiac status.

## 2011-05-22 NOTE — Clinical Documentation Improvement (Deleted)
RENAL FAILURE DOCUMENTATION CLARIFICATION QUERY  THIS DOCUMENT IS NOT A PERMANENT PART OF THE MEDICAL RECORD   Please update your documentation within the medical record to reflect your response to this query.                                                                                     05/22/11  Dr. Eppie Gibson and/or Associates,  In a better effort to capture your patient's severity of illness, reflect appropriate length of stay and utilization of resources, a review of the patient medical record has revealed the following indicators.    Based on your clinical judgment, please document in the progress notes and discharge summary if a condition below provides greater specificity regarding the patient's renal function this admission:   - Acute Renal Failure   - Acute Kidney Injury   - Other Condition   - Unable to Clinically Determine   Clinical Information:  BUN/CR/GFR this admission   (bf) 12/0.88/77 19/1.80/33  BNP elevated compared to previous in 2010, and CXR with pulm vasc congestion; will dose IV lasix and ntg for CHF.  Alfonzo Feller, DO 05/21/11  SOB: most likely from CHF exacerbation  - Will check 2d echo, Cardiac enzymes  - Continue betablocker, ACEi, aspirin, norvasc at home dose  - lasix 40 iv bid  - hydralazine prn for SBP >170  - monitor strict I/O and daily weight  - O2 supplementation for O2 sat >88%  - low salt diet  - follow up with cardiology at discharge Lester Belmont, M.D. (PGY3 05/21/11 2057   In responding to this query please exercise your independent judgment.  The fact that a query is asked, does not imply that any particular answer is desired or expected.   Reviewed: additional documentation in the medical record  Thank You,  Erling Conte  RN BSN Certified Clinical Documentation Specialist: Cell   [place]:  1. If  needed, update documentation for the patient's encounter via the notes activity.  2. Access this query again and click edit on the [employer].  3. After updating, or not, click F2 to complete all highlighted (required) fields concerning your review. Select "additional documentation in the medical record" OR "no additional documentation provided".  4. Click Sign note button.  5. The deficiency will fall out of your In Basket *Please let us know if you are not able to complete this workflow by phone or e-mail (listed below).

## 2011-05-22 NOTE — Clinical Documentation Improvement (Signed)
RESPIRATORY FAILURE DOCUMENTATION CLARIFICATION QUERY   THIS DOCUMENT IS NOT A PERMANENT PART OF THE MEDICAL RECORD  Please update your documentation within the medical record to reflect your response to this query.                                                                                     05/22/11  Dr. Eppie Gibson and/or Associates,  In a better effort to capture your patient's severity of illness, reflect appropriate length of stay and utilization of resources, a review of the patient medical record has revealed the following indicators.    Based on your clinical judgment, please document in the progress notes and discharge summary if a condition below provides greater specificity regarding the patient's respiratory status on presentation to the ED:   - Acute Respiratory Failure, resolved   - Acute Respiratory Insufficiency, resolved   - Other Condition   - Unable to Clinically Determine   Clinical Information:  "Respiratory: +cough, wheezing, SOB." "1145: Lungs continue coarse bilat after neb and Sats have increased from 84% R/A to 91-93% after neb on O2 N/C. Pt states she feels "better." Does appear more comfortable. BNP elevated compared to previous in 2010, and CXR with pulm vasc congestion; will dose IV lasix and ntg for CHF." Alfonzo Feller, DO 05/21/11  Room Air Sat on arrival - 84%   In responding to this query please exercise your independent judgment.  The fact that a query is asked, does not imply that any particular answer is desired or expected.  Reviewed:  no additional documentation provided  Thank You,  Erling Conte  RN BSN Certified Clinical Documentation Specialist: Cell   [phone]  Health Information Management [place]  TO RESPOND TO THE THIS QUERY, FOLLOW THE INSTRUCTIONS BELOW:  1. If needed, update documentation for the patient's encounter via the notes activity.  2. Access this query again and click edit on the In [employer].  3. After updating, or not, click F2 to complete all highlighted (required) fields concerning your review. Select "additional documentation in the medical record" OR "no additional documentation provided".  4. Click Sign note button.  5. The deficiency will fall out of your In Basket *Please let us know if you are not able to complete this workflow by phone or e-mail (listed below).

## 2011-05-22 NOTE — Progress Notes (Signed)
  Echocardiogram 2D Echocardiogram has been performed.  Diamond Nickel Deneen 05/22/2011, 12:22 PM

## 2011-05-22 NOTE — Progress Notes (Signed)
Inpatient Diabetes Program Recommendations  AACE/ADA: New Consensus Statement on Inpatient Glycemic Control (2009)  Target Ranges:  Prepandial:   less than 140 mg/dL      Peak postprandial:   less than 180 mg/dL (1-2 hours)      Critically ill patients:  140 - 180 mg/dL   Reason for Visit: Hyperglycemia  Inpatient Diabetes Program Recommendations Insulin - Meal Coverage: Please increase to 6 units tidwc (pt takes 8 units tidwc at home, A1C is 7.1% controlled) Diet: Added carbohydrate modified to existing diet orders.  Note: Thank you, Rosita Kea, RN, CNS, Diabetes Coordinator [phone])

## 2011-05-23 LAB — GLUCOSE, CAPILLARY
Glucose-Capillary: 112 mg/dL — ABNORMAL HIGH (ref 70–99)
Glucose-Capillary: 234 mg/dL — ABNORMAL HIGH (ref 70–99)
Glucose-Capillary: 158 mg/dL — ABNORMAL HIGH (ref 70–99)
Glucose-Capillary: 182 mg/dL — ABNORMAL HIGH (ref 70–99)

## 2011-05-23 LAB — CBC
HCT: 35.8 % — ABNORMAL LOW (ref 36.0–46.0)
Hemoglobin: 11.9 g/dL — ABNORMAL LOW (ref 12.0–15.0)
MCH: 30.4 pg (ref 26.0–34.0)
MCHC: 33.2 g/dL (ref 30.0–36.0)
MCV: 91.6 fL (ref 78.0–100.0)
Platelets: 175 10*3/uL (ref 150–400)
RBC: 3.91 MIL/uL (ref 3.87–5.11)
RDW: 13.7 % (ref 11.5–15.5)
WBC: 11.1 10*3/uL — ABNORMAL HIGH (ref 4.0–10.5)

## 2011-05-23 LAB — BASIC METABOLIC PANEL
BUN: 27 mg/dL — ABNORMAL HIGH (ref 6–23)
CO2: 26 mEq/L (ref 19–32)
Calcium: 8.8 mg/dL (ref 8.4–10.5)
Chloride: 101 mEq/L (ref 96–112)
Creatinine, Ser: 1.52 mg/dL — ABNORMAL HIGH (ref 0.50–1.10)
GFR calc Af Amer: 40 mL/min — ABNORMAL LOW (ref >90)
GFR calc non Af Amer: 34 mL/min — ABNORMAL LOW (ref >90)
Glucose, Bld: 114 mg/dL — ABNORMAL HIGH (ref 70–99)
Potassium: 3.2 mEq/L — ABNORMAL LOW (ref 3.5–5.1)
Sodium: 139 mEq/L (ref 135–145)

## 2011-05-23 LAB — MAGNESIUM: Magnesium: 1.7 mg/dL (ref 1.5–2.5)

## 2011-05-23 MED ORDER — SENNOSIDES-DOCUSATE SODIUM 8.6-50 MG PO TABS
1.0000 | ORAL_TABLET | Freq: Once | ORAL | Status: AC
  Administered 2011-05-23: 1 via ORAL
  Filled 2011-05-23 (×3): qty 1

## 2011-05-23 MED ORDER — POTASSIUM CHLORIDE CRYS ER 20 MEQ PO TBCR
40.0000 meq | EXTENDED_RELEASE_TABLET | ORAL | Status: AC
  Administered 2011-05-23 (×2): 40 meq via ORAL
  Filled 2011-05-23 (×2): qty 2

## 2011-05-23 MED ORDER — MAGNESIUM OXIDE 400 MG PO TABS
400.0000 mg | ORAL_TABLET | Freq: Two times a day (BID) | ORAL | Status: AC
  Administered 2011-05-23 – 2011-05-24 (×4): 400 mg via ORAL
  Filled 2011-05-23 (×4): qty 1

## 2011-05-23 NOTE — Progress Notes (Signed)
Subjective: Patient states that she feel slightly better. Still had mild SOB with exertion. afebrile for 24 hours. Nursing staff reports that she is unable to cough out her secretions. Foley to be removed today    Objective: Vital signs in last 24 hours: Filed Vitals:   05/23/11 0255 05/23/11 0444 05/23/11 0620 05/23/11 0843  BP: 116/75  139/67   Pulse: 70  72   Temp: 97.9 F (36.6 C)  98.7 F (37.1 C)   TempSrc: Oral  Oral   Resp: 18  20   Height:      Weight:   170 lb 1.6 oz (77.157 kg)   SpO2: 93% 91% 100% 92%   Weight change: -1 lb 2.4 oz (-0.521 kg)  Intake/Output Summary (Last 24 hours) at 05/23/11 0846 Last data filed at 05/23/11 0000  Gross per 24 hour  Intake   1090 ml  Output    800 ml  Net    290 ml   Physical Exam: Gen.: NAD, acute ill appearing  Lungs: Bilateral rhonchi and basilar crackles up to mid lungs. Occasional wheezing.  Heart: Regular rate and rhythm with a 2/6 harsh systolic murmur in the left upper sternal border without radiation. The S2 is loud and physiologically split. No gallops were appreciated.  Abdomen: Soft, non tender, active bowel sounds without masses.  Extremities: 1 pitting edema   Lab Results: Basic Metabolic Panel:   Lab 95/63/87 0555 05/22/11 0650  [name] 139 139  K 3.2* 3.7  CL 101 98  CO2 26 26  GLUCOSE 114* 227*  BUN 27* 19  CREATININE 1.52* 1.80*  CALCIUM 8.8 9.2  MG -- --  PHOS -- --   Liver Function Tests:  Lab 05/21/11 1503  AST 14  ALT 11  ALKPHOS 81  BILITOT 0.4  PROT 7.6  ALBUMIN 3.3*   CBC:  Lab 05/23/11 0555 05/22/11 0650 05/21/11 0925  WBC 11.1* 13.7* --  NEUTROABS -- -- 6.7  HGB 11.9* 13.6 --  HCT 35.8* 40.9 --  MCV 91.6 92.5 --  PLT 175 198 --   Cardiac Enzymes:  Lab 05/22/11 0650 05/21/11 2232 05/21/11 1502  CKTOTAL 97 107 111  CKMB 1.5 1.7 2.9  CKMBINDEX -- -- --  TROPONINI <0.30 <0.30 <0.30   BNP:  Lab 05/21/11 0930  PROBNP 1761.0*   CBG:  Lab 05/23/11 0614 05/22/11 2133  05/22/11 1637 05/22/11 1138 05/22/11 0701 05/21/11 2143  GLUCAP 112* 132* 221* 221* 214* 257*   Urinalysis:  Lab 05/22/11 0226  COLORURINE YELLOW  LABSPEC 1.023  PHURINE 5.5  GLUCOSEU NEGATIVE  HGBUR SMALL*  BILIRUBINUR NEGATIVE  KETONESUR NEGATIVE  PROTEINUR >300*  UROBILINOGEN 1.0  NITRITE NEGATIVE  LEUKOCYTESUR NEGATIVE     Micro Results: Recent Results (from the past 240 hour(s))  CULTURE, BLOOD (ROUTINE X 2)     Status: Normal (Preliminary result)   Collection Time   05/21/11 10:49 PM      Component Value Range Status Comment   Specimen Description BLOOD RIGHT FOREARM   Final    Special Requests BOTTLES DRAWN AEROBIC AND ANAEROBIC 10CC   Final    Culture  Setup Time [id number]   Final    Culture     Final    Value:        BLOOD CULTURE RECEIVED NO GROWTH TO DATE CULTURE WILL BE HELD FOR 5 DAYS BEFORE ISSUING A FINAL NEGATIVE REPORT   Report Status PENDING   Incomplete   CULTURE, BLOOD (ROUTINE X 2)  Status: Normal (Preliminary result)   Collection Time   05/21/11 10:49 PM      Component Value Range Status Comment   Specimen Description BLOOD RIGHT HAND   Final    Special Requests     Final    Value: BOTTLES DRAWN AEROBIC AND ANAEROBIC 6CC AER 4CC ANA   Culture  Setup Time [id number]   Final    Culture     Final    Value:        BLOOD CULTURE RECEIVED NO GROWTH TO DATE CULTURE WILL BE HELD FOR 5 DAYS BEFORE ISSUING A FINAL NEGATIVE REPORT   Report Status PENDING   Incomplete    Studies/Results: Dg Chest 2 View  05/21/2011  *RADIOLOGY REPORT*  Clinical Data: Shortness of breath, chest heaviness, productive cough.  CHEST - 2 VIEW  Comparison: 03/20/2011.  Findings: Trachea is midline.  Heart size is stable.  There is mild diffuse interstitial prominence and indistinctness with air space disease at the lung bases.  No pleural fluid.  IMPRESSION: Probable pulmonary edema with mild bibasilar air space disease.  Original Report Authenticated By: Luretha Rued,  M.D.   Medications: I have reviewed the patient's current medications. Scheduled Meds:    . albuterol  2.5 mg Nebulization Q4H  . amLODipine  10 mg Oral Daily  . antiseptic oral rinse  15 mL Mouth Rinse BID  . aspirin EC  81 mg Oral Daily  . enoxaparin  40 mg Subcutaneous Q24H  . gabapentin  300 mg Oral TID  . guaiFENesin  1,200 mg Oral BID  . insulin aspart  0-15 Units Subcutaneous TID WC  . insulin aspart  6 Units Subcutaneous TID WC  . insulin glargine  24 Units Subcutaneous QHS  . ipratropium  0.5 mg Nebulization Q4H  . lisinopril  40 mg Oral Daily  . metoprolol succinate  50 mg Oral Daily  . moxifloxacin  400 mg Intravenous QHS  . PARoxetine  20 mg Oral Daily  . potassium chloride  40 mEq Oral Q4H  . simvastatin  20 mg Oral QHS  . sodium chloride  3 mL Intravenous Q12H  . sodium chloride  3 mL Intravenous Q12H  . DISCONTD: albuterol  2.5 mg Nebulization TID  . DISCONTD: antiseptic oral rinse  15 mL Mouth Rinse BID  . DISCONTD: furosemide  40 mg Intravenous BID  . DISCONTD: insulin aspart  4 Units Subcutaneous TID WC   Continuous Infusions:  PRN Meds:.sodium chloride, acetaminophen, acetaminophen, albuterol, hydrALAZINE, ondansetron (ZOFRAN) IV, ondansetron, sodium chloride Assessment/Plan:  # Atypical CAP: slowly improving    - Oxygen therapy  - BD  - ABX>>>Avelox  - anti cough -flutter valve assisting cough - Blood Cx pending   # Acute renal insufficiency: improving    Lab 05/23/11 0555 05/22/11 0650 05/21/11 0925  CREATININE 1.52* 1.80* 0.88    -avoid nephrotoxic medications. - will not give her IVF due to diastolic dysfunction>>>     Echo: EF 55-60%. There was dynamic obstruction at restin the outflow tract. Normal appearing bioprothetic valve with no regurgitation. Mitral valve: Severe mitral annular calcification especially posteriorly   #. HTN  - continue home medication  #. DM  - A1C 7.1  - SSI  -increase meal coverage  -carb modified diet    #. AS s/p bovine valve replacement  - patient is not on any anticoagulation  #. HLD  - continue statin  - LDL 66  #. Stroke  - continue aspirin   LOS:  2 days   [name] 05/23/2011, 8:46 AM

## 2011-05-23 NOTE — Progress Notes (Signed)
Inpatient Diabetes Program Recommendations  AACE/ADA: New Consensus Statement on Inpatient Glycemic Control (2009)  Target Ranges:  Prepandial:   less than 140 mg/dL      Peak postprandial:   less than 180 mg/dL (1-2 hours)      Critically ill patients:  140 - 180 mg/dL   Reason for Visit: post-prandial hyperglycemia CBG this am at 112 mg/dL, perfect!  Pt did not require correction insulin for the cbg but did receive 6 units meal coverage (Novolog). However, cbg before lunch up to 234 mg/dL.  The 6 units alone for breakfast was not enough.  Therefore please increase to 8 units as at home.  Inpatient Diabetes Program Recommendations Insulin - Meal Coverage: Increae to 8 units tidwc as at home. Diet: Added carbohydrate modified to existing diet orders.  Note: Thank you, Rosita Kea, RN, CNS, Diabetes Coordinator [phone])

## 2011-05-24 LAB — GLUCOSE, CAPILLARY
Glucose-Capillary: 145 mg/dL — ABNORMAL HIGH (ref 70–99)
Glucose-Capillary: 297 mg/dL — ABNORMAL HIGH (ref 70–99)
Glucose-Capillary: 351 mg/dL — ABNORMAL HIGH (ref 70–99)

## 2011-05-24 LAB — BASIC METABOLIC PANEL
BUN: 28 mg/dL — ABNORMAL HIGH (ref 6–23)
CO2: 24 mEq/L (ref 19–32)
Calcium: 8.9 mg/dL (ref 8.4–10.5)
Chloride: 103 mEq/L (ref 96–112)
Creatinine, Ser: 1 mg/dL (ref 0.50–1.10)
GFR calc Af Amer: 66 mL/min — ABNORMAL LOW (ref >90)
GFR calc non Af Amer: 57 mL/min — ABNORMAL LOW (ref >90)
Glucose, Bld: 148 mg/dL — ABNORMAL HIGH (ref 70–99)
Potassium: 4.4 mEq/L (ref 3.5–5.1)
Sodium: 138 mEq/L (ref 135–145)

## 2011-05-24 MED ORDER — TAMSULOSIN HCL 0.4 MG PO CAPS
0.4000 mg | ORAL_CAPSULE | Freq: Every day | ORAL | Status: DC
  Administered 2011-05-24 – 2011-05-26 (×3): 0.4 mg via ORAL
  Filled 2011-05-24 (×3): qty 1

## 2011-05-24 NOTE — Progress Notes (Signed)
Subjective: Patient feels much better today. No complaints of chest pain, nausea or vomiting. Still feels a little weak- and says that she is not at her baseline. Still having some sputum production.    Objective: Vital signs in last 24 hours: Filed Vitals:   05/23/11 2231 05/24/11 0542 05/24/11 0740 05/24/11 0946  BP: 129/80 131/81  133/72  Pulse: 73 76  76  Temp: 98 F (36.7 C) 97.6 F (36.4 C)    TempSrc: Oral Oral    Resp: 20 19    Height:      Weight:  170 lb 14.4 oz (77.52 kg)    SpO2: 95% 96% 94%    Weight change: 4 lb 8 oz (2.041 kg)  Intake/Output Summary (Last 24 hours) at 05/24/11 1337 Last data filed at 05/24/11 1100  Gross per 24 hour  Intake    950 ml  Output   1350 ml  Net   -400 ml   Physical Exam: Gen.: NAD, acute ill appearing  Lungs: Minimal crackles at bilateral bases. No wheezing. Heart: Regular rate and rhythm with a 2/6 harsh systolic murmur in the left upper sternal border without radiation. The S2 is loud and physiologically split. No gallops were appreciated.  Abdomen: Soft, non tender, active bowel sounds without masses.  Extremities: 1 pitting edema   Lab Results: Basic Metabolic Panel:   Lab 57/32/20 0600 05/23/11 0555  NA 138 139  K 4.4 3.2*  CL 103 101  CO2 24 26  GLUCOSE 148* 114*  BUN 28* 27*  CREATININE 1.00 1.52*  CALCIUM 8.9 8.8  MG -- 1.7  PHOS -- --   Liver Function Tests:  Lab 05/21/11 1503  AST 14  ALT 11  ALKPHOS 81  BILITOT 0.4  PROT 7.6  ALBUMIN 3.3*   CBC:  Lab 05/23/11 0555 05/22/11 0650 05/21/11 0925  WBC 11.1* 13.7* --  NEUTROABS -- -- 6.7  HGB 11.9* 13.6 --  HCT 35.8* 40.9 --  MCV 91.6 92.5 --  PLT 175 198 --   Cardiac Enzymes:  Lab 05/22/11 0650 05/21/11 2232 05/21/11 1502  CKTOTAL 97 107 111  CKMB 1.5 1.7 2.9  CKMBINDEX -- -- --  TROPONINI <0.30 <0.30 <0.30   BNP:  Lab 05/21/11 0930  PROBNP 1761.0*   CBG:  Lab 05/24/11 1149 05/24/11 0627 05/23/11 2224 05/23/11 1712 05/23/11 1117  05/23/11 0614  GLUCAP 297* 145* 182* 158* 234* 112*   Urinalysis:  Lab 05/22/11 0226  COLORURINE YELLOW  LABSPEC 1.023  PHURINE 5.5  GLUCOSEU NEGATIVE  HGBUR SMALL*  BILIRUBINUR NEGATIVE  KETONESUR NEGATIVE  PROTEINUR >300*  UROBILINOGEN 1.0  NITRITE NEGATIVE  LEUKOCYTESUR NEGATIVE     Micro Results: Recent Results (from the past 240 hour(s))  CULTURE, BLOOD (ROUTINE X 2)     Status: Normal (Preliminary result)   Collection Time   05/21/11 10:49 PM      Component Value Range Status Comment   Specimen Description BLOOD RIGHT FOREARM   Final    Special Requests BOTTLES DRAWN AEROBIC AND ANAEROBIC 10CC   Final    Culture  Setup Time [id number]   Final    Culture     Final    Value:        BLOOD CULTURE RECEIVED NO GROWTH TO DATE CULTURE WILL BE HELD FOR 5 DAYS BEFORE ISSUING A FINAL NEGATIVE REPORT   Report Status PENDING   Incomplete   CULTURE, BLOOD (ROUTINE X 2)     Status: Normal (Preliminary result)  Collection Time   05/21/11 10:49 PM      Component Value Range Status Comment   Specimen Description BLOOD RIGHT HAND   Final    Special Requests     Final    Value: BOTTLES DRAWN AEROBIC AND ANAEROBIC 6CC AER 4CC ANA   Culture  Setup Time [id number]   Final    Culture     Final    Value:        BLOOD CULTURE RECEIVED NO GROWTH TO DATE CULTURE WILL BE HELD FOR 5 DAYS BEFORE ISSUING A FINAL NEGATIVE REPORT   Report Status PENDING   Incomplete    Studies/Results: No results found. Medications: I have reviewed the patient's current medications. Scheduled Meds:    . albuterol  2.5 mg Nebulization Q4H  . amLODipine  10 mg Oral Daily  . antiseptic oral rinse  15 mL Mouth Rinse BID  . aspirin EC  81 mg Oral Daily  . enoxaparin  40 mg Subcutaneous Q24H  . gabapentin  300 mg Oral TID  . guaiFENesin  1,200 mg Oral BID  . insulin aspart  0-15 Units Subcutaneous TID WC  . insulin aspart  6 Units Subcutaneous TID WC  . insulin glargine  24 Units Subcutaneous QHS  .  ipratropium  0.5 mg Nebulization Q4H  . lisinopril  40 mg Oral Daily  . magnesium oxide  400 mg Oral BID  . metoprolol succinate  50 mg Oral Daily  . moxifloxacin  400 mg Intravenous QHS  . PARoxetine  20 mg Oral Daily  . senna-docusate  1 tablet Oral Once  . simvastatin  20 mg Oral QHS  . sodium chloride  3 mL Intravenous Q12H  . sodium chloride  3 mL Intravenous Q12H  . Tamsulosin HCl  0.4 mg Oral Daily   Continuous Infusions:  PRN Meds:.sodium chloride, acetaminophen, acetaminophen, albuterol, hydrALAZINE, ondansetron (ZOFRAN) IV, ondansetron, sodium chloride Assessment/Plan:  # Atypical CAP: slowly improving    - Oxygen therapy  - BD  - ABX>>>Avelox  - Doxycycline or azithromycin to complete total 10 days therapy on discharge. -flutter valve assisting cough - Blood Cx pending - negative to date.  # Acute renal insufficiency: Resolved- Lasix-induced.    Lab 05/24/11 0600 05/23/11 0555 05/22/11 0650 05/21/11 0925  CREATININE 1.00 1.52* 1.80* 0.88    -avoid nephrotoxic medications. - will not give her IVF due to diastolic dysfunction>>>     Echo: EF 55-60%. There was dynamic obstruction at restin the outflow tract. Normal appearing bioprothetic valve with no regurgitation. Mitral valve: Severe mitral annular calcification especially posteriorly   #. HTN  - continue home medication   #. DM  - A1C 7.1  - SSI  -increase meal coverage  -carb modified diet   #. AS s/p bovine valve replacement  - patient is not on any anticoagulation   #. HLD  - continue statin  - LDL 66   #. Stroke  - continue aspirin   LOS: 3 days   Leonid Manus 05/24/2011, 1:37 PM

## 2011-05-24 NOTE — Progress Notes (Signed)
Pt has not urinated since d/c of Foley at 6:15 pm, did bladder scanner it only showed 141, MD notified, gave order to give Flomax 0.$RemoveBefor'4mg'[id number]$  and recheck bladder scanner again in couple hrs, will continue to monitor, Thanks, Arvella Nigh RN

## 2011-05-25 ENCOUNTER — Encounter (HOSPITAL_COMMUNITY): Payer: Self-pay | Admitting: Physician Assistant

## 2011-05-25 DIAGNOSIS — N179 Acute kidney failure, unspecified: Secondary | ICD-10-CM

## 2011-05-25 DIAGNOSIS — R079 Chest pain, unspecified: Secondary | ICD-10-CM

## 2011-05-25 DIAGNOSIS — R0609 Other forms of dyspnea: Secondary | ICD-10-CM

## 2011-05-25 DIAGNOSIS — R0989 Other specified symptoms and signs involving the circulatory and respiratory systems: Secondary | ICD-10-CM

## 2011-05-25 HISTORY — DX: Chronic kidney disease, unspecified: N17.9

## 2011-05-25 LAB — BASIC METABOLIC PANEL
BUN: 24 mg/dL — ABNORMAL HIGH (ref 6–23)
CO2: 26 mEq/L (ref 19–32)
Calcium: 9 mg/dL (ref 8.4–10.5)
Chloride: 105 mEq/L (ref 96–112)
Creatinine, Ser: 1.05 mg/dL (ref 0.50–1.10)
GFR calc Af Amer: 62 mL/min — ABNORMAL LOW (ref >90)
GFR calc non Af Amer: 54 mL/min — ABNORMAL LOW (ref >90)
Glucose, Bld: 155 mg/dL — ABNORMAL HIGH (ref 70–99)
Potassium: 4.5 mEq/L (ref 3.5–5.1)
Sodium: 140 mEq/L (ref 135–145)

## 2011-05-25 LAB — GLUCOSE, CAPILLARY
Glucose-Capillary: 199 mg/dL — ABNORMAL HIGH (ref 70–99)
Glucose-Capillary: 141 mg/dL — ABNORMAL HIGH (ref 70–99)
Glucose-Capillary: 206 mg/dL — ABNORMAL HIGH (ref 70–99)
Glucose-Capillary: 253 mg/dL — ABNORMAL HIGH (ref 70–99)

## 2011-05-25 MED ORDER — MOXIFLOXACIN HCL 400 MG PO TABS: 400.0000 mg | ORAL_TABLET | Freq: Every day | ORAL | Status: DC

## 2011-05-25 MED ORDER — MOXIFLOXACIN HCL 400 MG PO TABS
400.0000 mg | ORAL_TABLET | Freq: Every day | ORAL | Status: DC
  Administered 2011-05-25: 400 mg via ORAL
  Filled 2011-05-25 (×2): qty 1

## 2011-05-25 MED ORDER — ALBUTEROL SULFATE (5 MG/ML) 0.5% IN NEBU
2.5000 mg | INHALATION_SOLUTION | Freq: Three times a day (TID) | RESPIRATORY_TRACT | Status: DC
  Administered 2011-05-25 – 2011-05-26 (×4): 2.5 mg via RESPIRATORY_TRACT
  Filled 2011-05-25 (×3): qty 0.5

## 2011-05-25 MED ORDER — IPRATROPIUM BROMIDE 0.02 % IN SOLN
0.5000 mg | Freq: Three times a day (TID) | RESPIRATORY_TRACT | Status: DC
  Administered 2011-05-25 – 2011-05-26 (×4): 0.5 mg via RESPIRATORY_TRACT
  Filled 2011-05-25 (×4): qty 2.5

## 2011-05-25 NOTE — Discharge Instructions (Signed)
1. Please follow up with the clinic in one week 2. Please finish your antibiotics.

## 2011-05-25 NOTE — Discharge Summary (Signed)
Internal [facility] INTERIM Discharge Note  Name: [name] MRN: [id number] DOB: 06/12/1945 66 y.o.  Date of Admission: 05/21/2011  9:40 AM Date of Discharge: 05/26/2011 Attending Physician: Dr. Eppie Gibson Discharge Diagnosis:   1. Community acquired pneumonia  2. Acute renal insufficiency  3. hypertension  4. Diabetes mellitus  5. S/P bovine valve replacement  6. Hyperlipidemia  7. History of stroke   Discharge Medications: Medication List  As of 05/26/2011 11:40 AM   STOP taking these medications         hydrochlorothiazide 25 MG tablet      metoprolol succinate 50 MG 24 hr tablet         TAKE these medications         alendronate 70 MG tablet   Commonly known as: FOSAMAX   Take 70 mg by mouth every 7 (seven) days. Take with a full glass of water on an empty stomach. Takes on Sundays      amLODipine 10 MG tablet   Commonly known as: NORVASC   Take 1 tablet (10 mg total) by mouth daily.      aspirin 81 MG tablet   Take 81 mg by mouth daily.      freestyle lancets   Use as instructed      gabapentin 300 MG capsule   Commonly known as: NEURONTIN   Take 1 capsule (300 mg total) by mouth 3 (three) times daily.      GAS RELIEF PO   Take 2 tablets by mouth daily as needed. For gas      guaiFENesin 600 MG 12 hr tablet   Commonly known as: MUCINEX   Take 2 tablets (1,200 mg total) by mouth 2 (two) times daily.      ibuprofen 200 MG tablet   Commonly known as: ADVIL,MOTRIN   Take 400 mg by mouth every 6 (six) hours as needed. For pain      insulin aspart 100 UNIT/ML injection   Commonly known as: novoLOG   Inject 8 Units into the skin 3 (three) times daily before meals. Inject 8 units before breakfast and lunch and dinner      insulin glargine 100 UNIT/ML injection   Commonly known as: LANTUS   Inject 24 Units into the skin at bedtime.      lisinopril 40 MG tablet   Commonly known as: PRINIVIL,ZESTRIL   Take 1 tablet (40 mg total) by  mouth daily.      metFORMIN 1000 MG tablet   Commonly known as: GLUCOPHAGE   Take 1 tablet (1,000 mg total) by mouth 2 (two) times daily with a meal.      moxifloxacin 400 MG tablet   Commonly known as: AVELOX   Take 1 tablet (400 mg total) by mouth daily at 6 PM.      PARoxetine 20 MG tablet   Commonly known as: PAXIL   Take 20 mg by mouth every morning.      Pen Needles 3/16" 31G X 5 MM Misc   1 each by Does not apply route 4 (four) times daily.      simvastatin 20 MG tablet   Commonly known as: ZOCOR   Take 1 tablet (20 mg total) by mouth at bedtime.            Disposition and follow-up:   [name] was discharged from [facility] in Stable condition.    Follow-up Appointments: Follow-up Information    Follow  up with Larey Dresser, MD on 06/03/2011. (@ 8:15AM)       Follow up with Jenell Milliner, MD on 06/04/2011. ($RemoveBefore'@11'TvYowtanAknxO$ :30)    Contact information:   [address] [place] [phone]         Discharge Orders    Future Appointments: Provider: Department: Dept Phone: Center:   06/03/2011 8:15 AM [name], [place] [phone] [facility]   06/04/2011 11:30 AM Liliane Shi, [place] [phone] [id number]     Future Orders Please Complete By Expires   Diet - low sodium heart healthy      Increase activity slowly      Face-to-face encounter (required for Medicare/Medicaid patients)      Comments:   I [name] certify that this patient is under my care and that I, or a nurse practitioner or physician's assistant working with me, had a face-to-face encounter that meets the physician face-to-face encounter requirements with this patient on 05/26/2011.       Questions: Responses:   The encounter with the patient was in whole, or in part, for the following medical condition, which is the primary reason for home health care Pneumonia, SOB   I certify  that, based on my findings, the following services are medically necessary home health services Nursing   My clinical findings support the need for the above services High Risk for rehospitalization    Patients with multiple co-morbidities (wounds and uncontrolled DM for example)   Further, I certify that my clinical findings support that this patient is homebound (i.e. absences from home require considerable and taxing effort and are for medical reasons or religious services or infrequently or of short duration when for other reasons) Shortness of Breath with activity    Ambulates short distances less than 300 feet   To provide the following care/treatments RN      Consultations: Treatment Team:  Rounding Lbcardiology, MD  Procedures Performed:  Dg Chest 2 View  05/21/2011  *RADIOLOGY REPORT*  Clinical Data: Shortness of breath, chest heaviness, productive cough.  CHEST - 2 VIEW  Comparison: 03/20/2011.  Findings: Trachea is midline.  Heart size is stable.  There is mild diffuse interstitial prominence and indistinctness with air space disease at the lung bases.  No pleural fluid.  IMPRESSION: Probable pulmonary edema with mild bibasilar air space disease.  Original Report Authenticated By: Luretha Rued, M.D.    2D Echo:  - 55% to 60%.  - There was dynamic obstruction at restin the outflow tract, with mid-cavity obliteration, - Aortic valve: By history patient has AVR. Normal appearing bioprothetic valve with no regurgitation - Mitral valve: Severe mitral annular calcification especially posteriorly.  Admission HPI:  Patient is a 66 y.o. female with a PMHx of CAD, CHF, DM, HTN, HLD , who presents to [facility] for evaluation of shortness of breath which is ongoing for past 2 weeks . She started have more shortness of breath with minimal exertion that usual which progressed to SOB at rest. She has been having worsened SOB with lying down and she wakes up at night SOB and is unable to go back to  bed. She denies any chest pain. She denies similar symptoms before. She has not been taking her lasix regularly because she has to get up every few minutes to go to the bathroom. She denies fever, chills, night sweats. She has cough productive of brownish sputum .She has swelling  in her legs but that is not worse than usual.   Hospital Course by problem list:   #. Community acquired pneumonia    Patient presented with progressive shortness of breath over last two weeks. She spiked a fever of 101.2 and developed leukocytosis on the admission. Her Xray showed bilateral diffuse interstitial prominence. Her clinical picture was consistent with atypical pneumonia with progressive dyspnea. She was treated with oxygen therapy, bronchodilators, IV Avelox antibiotics during this admission and improved clinically at time of discharge.  She was sent home with 7 day course  of Avelox $RemoveB'400mg'tIWeBPZW$  po qd (she would have a total of 12 days of abx).  Her SOB is likely due to her PNA. Will have patient follow up with Dr. Lynnae January who is her PCP in one week (06/03/11 @ 8:15) to re-evaluate her cough/sob and if she continues to be SOB, she may need Pulmonology Referral.   #. Acute renal insufficiency: Cr on 3/26 was 1.80 and this is most likely iatrogenic with Lasix as the initial diagnosis was felt to be decompensated diastolic heart failure. With diuresis her renal function has worsened. Her diuresis was stopped and her acute kidney injury was quickly resolved. Her Cr returns back to her baseline. Cr was 1.01 at time of discharge.   #. Diastolic dysfunction with dynamic outflow tract obstruction:  An echo was done to assess her Bovine valve which shows EF 55-60%,  There was dynamic obstruction at restin the outflow tract, with mid-cavity obliteration, a peak velocity of 410cm/sec, and a peak gradient of 68mmHg. Severe mitral annular calcification especially posteriorly. The day prior to discharge, patient ambulated and her O2 sat  dropped down to 82% so we were concerned if her SOB was due to the dynamic outflow obstruction; therefore, Cardiology was consulted regarding her dynamic outflow obstruction.  Dr. Verl Blalock evaluated patient and thought that "her drop in sats with exertion was not due to outflow obstruction or CHF. Once her PNA clears, would reassess oximetry in the office. If still drops, will need to see Pulmonary as an outpatient. Hold BB until wheezes and rhonchi resolve. May need to go home on O2 for a few weeks.".  On day of discharge, nurse ambulated patient again to see if she qualifies for home O2 but she dropped her O2 to 88% but then went back up 90's while ambulation with a few deep breaths; therefore, case manager deemed that she does not meet criteria.  However, we will set up home RN to check on patient and check her O2 at home.  Patient will also follow up with Dr. Verl Blalock in 1 week 06/04/11.     #. Hypertension: Well-controlled.  We continue home medication except we held diuretics during hospital course due to her dynamic outflow obstruction associated with severe MV calcification. seen on 2D echocardiogram.  Patient was instructed to stop her HCTZ since it decreases preload.  We also instructed patient to hold her metoprolol until her SOB/pneumonia/wheezing improves and her PCP may decide to resume once she improves.   #. Diabetes mellitus, stable, continue home regimen.   #. Hyperlipidemia, stable, continue statin   #. History of stroke, stable, continue Aspirin   Discharge Vitals:  BP 113/71  Pulse 73  Temp(Src) 98.8 F (37.1 C) (Oral)  Resp 18  Ht $R'5\' 7"'jF$  (1.702 m)  Wt 171 lb 14.4 oz (77.973 kg)  BMI 26.92 kg/m2  SpO2 90%  Discharge Labs:  Results for orders placed during the hospital encounter of 05/21/11 (  from the past 24 hour(s))  GLUCOSE, CAPILLARY     Status: Abnormal   Collection Time   05/25/11  4:29 PM      Component Value Range   Glucose-Capillary 253 (*) 70 - 99 (mg/dL)  GLUCOSE,  CAPILLARY     Status: Abnormal   Collection Time   05/25/11  9:56 PM      Component Value Range   Glucose-Capillary 227 (*) 70 - 99 (mg/dL)   Comment 1 Notify RN    BASIC METABOLIC PANEL     Status: Abnormal   Collection Time   05/26/11  6:05 AM      Component Value Range   Sodium 138  135 - 145 (mEq/L)   Potassium 4.5  3.5 - 5.1 (mEq/L)   Chloride 104  96 - 112 (mEq/L)   CO2 26  19 - 32 (mEq/L)   Glucose, Bld 107 (*) 70 - 99 (mg/dL)   BUN 21  6 - 23 (mg/dL)   Creatinine, Ser 1.01  0.50 - 1.10 (mg/dL)   Calcium 9.1  8.4 - 10.5 (mg/dL)   GFR calc non Af Amer 56 (*) >90 (mL/min)   GFR calc Af Amer 65 (*) >90 (mL/min)  GLUCOSE, CAPILLARY     Status: Abnormal   Collection Time   05/26/11  6:17 AM      Component Value Range   Glucose-Capillary 117 (*) 70 - 99 (mg/dL)   Comment 1 Notify RN      SignedJulius Bowels, 05/26/11 @ 11:15AM

## 2011-05-25 NOTE — Progress Notes (Addendum)
Subjective: Patient states that she feels better. Less cough/sputum.  No SOB. Able to urinate well. No acute events overnight.  Objective: Vital signs in last 24 hours: Filed Vitals:   05/24/11 2337 05/25/11 0338 05/25/11 0511 05/25/11 0843  BP:  146/75 128/74   Pulse:  70 65   Temp:  98.6 F (37 C) 98.1 F (36.7 C)   TempSrc:  Oral Oral   Resp:  16 18   Height:      Weight:   171 lb 11.2 oz (77.883 kg)   SpO2: 95% 95% 95% 90%   Weight change: 12.8 oz (0.363 kg)  Intake/Output Summary (Last 24 hours) at 05/25/11 0900 Last data filed at 05/25/11 0511  Gross per 24 hour  Intake    660 ml  Output    825 ml  Net   -165 ml   Physical Exam: Gen.: NAD, acute ill appearing  Lungs: B/L crackles up to 1/3 lungs. No wheezing.  Heart: Regular rate and rhythm with a 2/6 harsh systolic murmur in the left upper sternal border without radiation. The S2 is loud and physiologically split. No gallops were appreciated.  Abdomen: Soft, non tender, active bowel sounds without masses.  Extremities: 1+ pitting edema   Lab Results: Basic Metabolic Panel:  Lab 12/87/86 0548 05/24/11 0600 05/23/11 0555  Manya Balash 140 138 --  K 4.5 4.4 --  CL 105 103 --  CO2 26 24 --  GLUCOSE 155* 148* --  BUN 24* 28* --  CREATININE 1.05 1.00 --  CALCIUM 9.0 8.9 --  MG -- -- 1.7  PHOS -- -- --   Liver Function Tests:  Lab 05/21/11 1503  AST 14  ALT 11  ALKPHOS 81  BILITOT 0.4  PROT 7.6  ALBUMIN 3.3*   CBC:  Lab 05/23/11 0555 05/22/11 0650 05/21/11 0925  WBC 11.1* 13.7* --  NEUTROABS -- -- 6.7  HGB 11.9* 13.6 --  HCT 35.8* 40.9 --  MCV 91.6 92.5 --  PLT 175 198 --   Cardiac Enzymes:  Lab 05/22/11 0650 05/21/11 2232 05/21/11 1502  CKTOTAL 97 107 111  CKMB 1.5 1.7 2.9  CKMBINDEX -- -- --  TROPONINI <0.30 <0.30 <0.30   BNP:  Lab 05/21/11 0930  PROBNP 1761.0*   CBG:  Lab 05/25/11 0601 05/24/11 2146 05/24/11 1545 05/24/11 1149 05/24/11 0627 05/23/11 2224  GLUCAP 141* 199* 351* 297* 145*  182*   Urinalysis:  Lab 05/22/11 0226  COLORURINE YELLOW  LABSPEC 1.023  PHURINE 5.5  GLUCOSEU NEGATIVE  HGBUR SMALL*  BILIRUBINUR NEGATIVE  KETONESUR NEGATIVE  PROTEINUR >300*  UROBILINOGEN 1.0  NITRITE NEGATIVE  LEUKOCYTESUR NEGATIVE    Micro Results: Recent Results (from the past 240 hour(s))  CULTURE, BLOOD (ROUTINE X 2)     Status: Normal (Preliminary result)   Collection Time   05/21/11 10:49 PM      Component Value Range Status Comment   Specimen Description BLOOD RIGHT FOREARM   Final    Special Requests BOTTLES DRAWN AEROBIC AND ANAEROBIC 10CC   Final    Culture  Setup Time [id number]   Final    Culture     Final    Value:        BLOOD CULTURE RECEIVED NO GROWTH TO DATE CULTURE WILL BE HELD FOR 5 DAYS BEFORE ISSUING A FINAL NEGATIVE REPORT   Report Status PENDING   Incomplete   CULTURE, BLOOD (ROUTINE X 2)     Status: Normal (Preliminary result)   Collection Time  05/21/11 10:49 PM      Component Value Range Status Comment   Specimen Description BLOOD RIGHT HAND   Final    Special Requests     Final    Value: BOTTLES DRAWN AEROBIC AND ANAEROBIC 6CC AER 4CC ANA   Culture  Setup Time [id number]   Final    Culture     Final    Value:        BLOOD CULTURE RECEIVED NO GROWTH TO DATE CULTURE WILL BE HELD FOR 5 DAYS BEFORE ISSUING A FINAL NEGATIVE REPORT   Report Status PENDING   Incomplete    Studies/Results: No results found. Medications: I have reviewed the patient's current medications. Scheduled Meds:   . albuterol  2.5 mg Nebulization TID  . amLODipine  10 mg Oral Daily  . antiseptic oral rinse  15 mL Mouth Rinse BID  . aspirin EC  81 mg Oral Daily  . enoxaparin  40 mg Subcutaneous Q24H  . gabapentin  300 mg Oral TID  . guaiFENesin  1,200 mg Oral BID  . insulin aspart  0-15 Units Subcutaneous TID WC  . insulin aspart  6 Units Subcutaneous TID WC  . insulin glargine  24 Units Subcutaneous QHS  . ipratropium  0.5 mg Nebulization TID  . lisinopril  40  mg Oral Daily  . magnesium oxide  400 mg Oral BID  . metoprolol succinate  50 mg Oral Daily  . moxifloxacin  400 mg Oral q1800  . PARoxetine  20 mg Oral Daily  . simvastatin  20 mg Oral QHS  . sodium chloride  3 mL Intravenous Q12H  . sodium chloride  3 mL Intravenous Q12H  . Tamsulosin HCl  0.4 mg Oral Daily  . DISCONTD: albuterol  2.5 mg Nebulization Q4H  . DISCONTD: ipratropium  0.5 mg Nebulization Q4H  . DISCONTD: moxifloxacin  400 mg Intravenous QHS   Continuous Infusions:  PRN Meds:.sodium chloride, acetaminophen, acetaminophen, albuterol, hydrALAZINE, ondansetron (ZOFRAN) IV, ondansetron, sodium chloride Assessment/Plan:  # Atypical CAP: improving  - Oxygen therapy >>>check O2 sat at rest and ambulate today>>> Patient is SOB and her O2 Sat dropped to 82% while ambulating on RA>>>will call cardiology.  - BD  - ABX>>>Avelox  - Doxycycline or azithromycin to complete total 10 days therapy on discharge.  -flutter valve assisting cough  - Blood Cx pending - negative to date.   # Acute renal insufficiency: Resolved- Lasix-induced.   -avoid nephrotoxic medications.  - will not give her IVF due to diastolic dysfunction>>>   # Echo: EF 55-60%. There was dynamic obstruction at restin the outflow tract. Normal appearing bioprothetic valve with no regurgitation. Mitral valve: Severe mitral annular calcification especially posteriorly    Avoid medications that drop preload.  #. HTN  - continue home medication  #. DM  - A1C 7.1  - SSI  -increase meal coverage  -carb modified diet  #. AS s/p bovine valve replacement  - patient is not on any anticoagulation  #. HLD  - continue statin  - LDL 66  #. Stroke  - continue aspirin       LOS: 4 days    Rockell Faulks 05/25/2011, 9:00 AM

## 2011-05-25 NOTE — Progress Notes (Signed)
PHARMACIST - PHYSICIAN COMMUNICATION Internal Medicine Team CONCERNING: Antibiotic IV to Oral Route Change Policy  RECOMMENDATION: This patient is receiving Avelox by the intravenous route.  Based on criteria approved by the Pharmacy and Therapeutics Committee, the antibiotic is being converted to the equivalent oral dose form.   DESCRIPTION: These criteria include:  Patient being treated for a respiratory tract infection, urinary tract infection, or cellulitis  The patient is not neutropenic and does not exhibit a GI malabsorption state  The patient is eating (either orally or via tube) and/or has been taking other orally administered medications for a least 24 hours  The patient is improving clinically and has a Tmax < 100.5  If you have questions about this conversion, please contact the Pharmacy Department  $RemoveBef'[]'AKFnsXelId$   [phone] )  [facility] $RemoveBefo'[x]'SgqkKAxSsUu$   [phone] )  [facility]  $Remov'[]'pYYAhz$   [phone] )  [facility] $RemoveBeforeD'[]'jgphNaGpszQapW$   [phone] )  [facility]   Thank you,  Francesca Jewett, PharmD Pager: [phone]  05/25/2011 7:52 AM

## 2011-05-25 NOTE — Progress Notes (Signed)
Patient to wean off oxygen per MD order.  Oxygen saturation was 96% on room air after d.c of oxygen. One hour after sat was 92% on room air. Upon ambulation per MD order, o2 sat dropped as low as 82% with some SOB. MD order to restart patient on o2, D/c discharge order and continue to monitor.
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
## 2011-05-26 LAB — GLUCOSE, CAPILLARY
Glucose-Capillary: 227 mg/dL — ABNORMAL HIGH (ref 70–99)
Glucose-Capillary: 117 mg/dL — ABNORMAL HIGH (ref 70–99)

## 2011-05-26 LAB — BASIC METABOLIC PANEL
BUN: 21 mg/dL (ref 6–23)
CO2: 26 mEq/L (ref 19–32)
Calcium: 9.1 mg/dL (ref 8.4–10.5)
Chloride: 104 mEq/L (ref 96–112)
Creatinine, Ser: 1.01 mg/dL (ref 0.50–1.10)
GFR calc Af Amer: 65 mL/min — ABNORMAL LOW (ref >90)
GFR calc non Af Amer: 56 mL/min — ABNORMAL LOW (ref >90)
Glucose, Bld: 107 mg/dL — ABNORMAL HIGH (ref 70–99)
Potassium: 4.5 mEq/L (ref 3.5–5.1)
Sodium: 138 mEq/L (ref 135–145)

## 2011-05-26 MED ORDER — INSULIN ASPART 100 UNIT/ML ~~LOC~~ SOLN
8.0000 [IU] | Freq: Three times a day (TID) | SUBCUTANEOUS | Status: DC
  Administered 2011-05-26: 8 [IU] via SUBCUTANEOUS

## 2011-05-26 MED ORDER — MOXIFLOXACIN HCL 400 MG PO TABS: 400.0000 mg | ORAL_TABLET | Freq: Every day | ORAL | Status: DC

## 2011-05-26 NOTE — Progress Notes (Signed)
CM with referral for home oxygen, however upon walking in the hall using walker, O2 sat maintained in 90's with one episode of Oxygen saturation < 88% and recovered when pt took several deep breathes. Notified Dr Silverio Decamp of pt not qualifying for home oxygen.  Will ask for [facility] for eval and assessment in the home.  Jasmine Pang RN MPH Case Manager [phone]

## 2011-05-26 NOTE — Progress Notes (Signed)
a  CARE MANAGEMENT NOTE 05/26/2011  Patient:  [name], [name]   Account Number:  [id number]  Date Initiated:  05/26/2011  Documentation initiated by:  Ercil Cassis  Subjective/Objective Assessment:   Order for home oxygen.     Action/Plan:   Pt sats 100% on room air, and maintained in 90's while ambulationing with one episode of <88% which the pt recovered with a few deep breaths.   Anticipated DC Date:  05/26/2011   Anticipated DC Plan:  [place]         [facility] Choice  HOME HEALTH   Choice offered to / List presented to:  C-1 Patient        [place] arranged  HH-1 RN      [place].   Status of service:  Completed, signed off Medicare Important Message given?   (If response is "NO", the following Medicare IM given date fields will be blank) Date Medicare IM given:   Date Additional Medicare IM given:    Discharge Disposition:  [place]  Per UR Regulation:    If discussed at Long Length of Stay Meetings, dates discussed:    Comments:  05/26/2011 Met with pt who understands that she does not qualify for home oxygen, has used [facility] in the past and wishes to use that agency again for [facility] assessment and eval . Jasmine Pang RN MPH Case Manager [phone]

## 2011-05-26 NOTE — Progress Notes (Signed)
Patient given discharge instructions. Patient discharged home.

## 2011-05-26 NOTE — Progress Notes (Addendum)
Subjective: She feels good this morning, she did dropped her O2 sat down to 88% on ambulation on RA but did not require O2 to bring her O2 sat back up.  Her O2 sat went back up after she took a few deep breath; therefore, case management states that she does not meet criteria for home O2. Objective: Vital signs in last 24 hours: Filed Vitals:   05/25/11 1413 05/25/11 1458 05/25/11 2153 05/26/11 0501  BP:  119/70 128/72 113/71  Pulse:  70 79 73  Temp:  99 F (37.2 C) 98.6 F (37 C) 98.8 F (37.1 C)  TempSrc:  Oral Oral Oral  Resp:  $Remo'18 17 18  'zRldm$ Height:      Weight:    171 lb 14.4 oz (77.973 kg)  SpO2: 95% 91% 94% 90%   Weight change: 3.2 oz (0.091 kg)  Intake/Output Summary (Last 24 hours) at 05/26/11 7322 Last data filed at 05/26/11 0800  Gross per 24 hour  Intake    650 ml  Output   1075 ml  Net   -425 ml   Physical Exam: General: alert, well-developed, and cooperative to examination.   Lungs: normal respiratory effort, no accessory muscle use,coarse breath sounds, + crackles bilaterally up to mid lung fields, and no wheezes. Heart: distant heart sounds, difficult to auscultate due to her coarse breath sounds, no murmur, no gallop, and no rub.  Abdomen: soft, non-tender, normal bowel sounds, no distention, no guarding, no rebound tenderness, no hepatomegaly Msk: no joint swelling, no joint warmth, and no redness over joints.  Pulses: 2+ DP/PT pulses bilaterally Extremities: No cyanosis, clubbing, trace edema on LE Neurologic: nonfocal Skin: turgor normal and no rashes.  Psych: Oriented X3, memory intact for recent and remote, normally interactive, good eye contact, not anxious appearing, and not depressed appearing.  Lab Results: Basic Metabolic Panel:  Lab 02/54/27 0605 05/25/11 0548 05/23/11 0555  NA 138 140 --  K 4.5 4.5 --  CL 104 105 --  CO2 26 26 --  GLUCOSE 107* 155* --  BUN 21 24* --  CREATININE 1.01 1.05 --  CALCIUM 9.1 9.0 --  MG -- -- 1.7  PHOS -- -- --    Liver Function Tests:  Lab 05/21/11 1503  AST 14  ALT 11  ALKPHOS 81  BILITOT 0.4  PROT 7.6  ALBUMIN 3.3*   CBC:  Lab 05/23/11 0555 05/22/11 0650 05/21/11 0925  WBC 11.1* 13.7* --  NEUTROABS -- -- 6.7  HGB 11.9* 13.6 --  HCT 35.8* 40.9 --  MCV 91.6 92.5 --  PLT 175 198 --   Cardiac Enzymes:  Lab 05/22/11 0650 05/21/11 2232 05/21/11 1502  CKTOTAL 97 107 111  CKMB 1.5 1.7 2.9  CKMBINDEX -- -- --  TROPONINI <0.30 <0.30 <0.30   BNP:  Lab 05/21/11 0930  PROBNP 1761.0*   CBG:  Lab 05/26/11 0617 05/25/11 2156 05/25/11 1629 05/25/11 1103 05/25/11 0601 05/24/11 2146  GLUCAP 117* 227* 253* 206* 141* 199*   Urinalysis:  Lab 05/22/11 0226  COLORURINE YELLOW  LABSPEC 1.023  PHURINE 5.5  GLUCOSEU NEGATIVE  HGBUR SMALL*  BILIRUBINUR NEGATIVE  KETONESUR NEGATIVE  PROTEINUR >300*  UROBILINOGEN 1.0  NITRITE NEGATIVE  LEUKOCYTESUR NEGATIVE     Micro Results: Recent Results (from the past 240 hour(s))  CULTURE, BLOOD (ROUTINE X 2)     Status: Normal (Preliminary result)   Collection Time   05/21/11 10:49 PM      Component Value Range Status Comment  Specimen Description BLOOD RIGHT FOREARM   Final    Special Requests BOTTLES DRAWN AEROBIC AND ANAEROBIC 10CC   Final    Culture  Setup Time [id number]   Final    Culture     Final    Value:        BLOOD CULTURE RECEIVED NO GROWTH TO DATE CULTURE WILL BE HELD FOR 5 DAYS BEFORE ISSUING A FINAL NEGATIVE REPORT   Report Status PENDING   Incomplete   CULTURE, BLOOD (ROUTINE X 2)     Status: Normal (Preliminary result)   Collection Time   05/21/11 10:49 PM      Component Value Range Status Comment   Specimen Description BLOOD RIGHT HAND   Final    Special Requests     Final    Value: BOTTLES DRAWN AEROBIC AND ANAEROBIC 6CC AER 4CC ANA   Culture  Setup Time [id number]   Final    Culture     Final    Value:        BLOOD CULTURE RECEIVED NO GROWTH TO DATE CULTURE WILL BE HELD FOR 5 DAYS BEFORE ISSUING A FINAL  NEGATIVE REPORT   Report Status PENDING   Incomplete    Studies/Results: No results found. Medications: Scheduled Meds:   . albuterol  2.5 mg Nebulization TID  . amLODipine  10 mg Oral Daily  . antiseptic oral rinse  15 mL Mouth Rinse BID  . aspirin EC  81 mg Oral Daily  . enoxaparin  40 mg Subcutaneous Q24H  . gabapentin  300 mg Oral TID  . guaiFENesin  1,200 mg Oral BID  . insulin aspart  0-15 Units Subcutaneous TID WC  . insulin aspart  8 Units Subcutaneous TID WC  . insulin glargine  24 Units Subcutaneous QHS  . ipratropium  0.5 mg Nebulization TID  . lisinopril  40 mg Oral Daily  . metoprolol succinate  50 mg Oral Daily  . moxifloxacin  400 mg Oral q1800  . PARoxetine  20 mg Oral Daily  . simvastatin  20 mg Oral QHS  . sodium chloride  3 mL Intravenous Q12H  . sodium chloride  3 mL Intravenous Q12H  . Tamsulosin HCl  0.4 mg Oral Daily  . DISCONTD: insulin aspart  6 Units Subcutaneous TID WC   Continuous Infusions:  PRN Meds:.sodium chloride, acetaminophen, acetaminophen, albuterol, hydrALAZINE, ondansetron (ZOFRAN) IV, ondansetron, sodium chloride Assessment/Plan: # Atypical CAP: improving clinically.  Patient does not meet criteria for home O2.  But will send out [facility] RN to check on patient while she is at home and check her O2 sat. -Will send home for 7 days of Avelox $RemoveB'400mg'yYLhBbdL$  po qd - Blood Cx pending - negative to date.  -Will hold BB until patient sees Dr. Lynnae January  -Follow up with PCP in 1 week # Acute renal insufficiency: Resolved- Lasix-induced.  -avoid nephrotoxic medications.   # Echo: EF 55-60%. There was dynamic obstruction at restin the outflow tract. Normal appearing bioprothetic valve with no regurgitation. Mitral valve: Severe mitral annular calcification especially posteriorly  Avoid medications that drop preload. Patient was evaluated by Dr. Verl Blalock yesterday and he did not think that patient's SOB was due to her outlet obstruction/CHF and likely 2/2 pneumonia.   Will continue to treat with abx.  Patient will follow up with Dr Verl Blalock as outpatient next week.  #. HTN  - continue home medication except for holding BB until her SOB improves  #. DM  - A1C 7.1  -  SSI  -continue meal coverage  -carb modified diet   #. AS s/p bovine valve replacement  - patient is not on any anticoagulation  #. HLD  - continue statin  - LDL 66   #. Stroke  - continue aspirin   Dispo: Home today   LOS: 5 days   Sharren Schnurr 05/26/2011, 9:07 AM

## 2011-05-28 LAB — CULTURE, BLOOD (ROUTINE X 2)
Culture  Setup Time: 201303280359
Culture  Setup Time: 201303280359
Culture: NO GROWTH
Culture: NO GROWTH

## 2011-05-30 DIAGNOSIS — E119 Type 2 diabetes mellitus without complications: Secondary | ICD-10-CM | POA: Diagnosis not present

## 2011-05-30 DIAGNOSIS — I10 Essential (primary) hypertension: Secondary | ICD-10-CM | POA: Diagnosis not present

## 2011-05-30 DIAGNOSIS — I2510 Atherosclerotic heart disease of native coronary artery without angina pectoris: Secondary | ICD-10-CM | POA: Diagnosis not present

## 2011-05-30 DIAGNOSIS — I5030 Unspecified diastolic (congestive) heart failure: Secondary | ICD-10-CM | POA: Diagnosis not present

## 2011-05-30 DIAGNOSIS — J189 Pneumonia, unspecified organism: Secondary | ICD-10-CM | POA: Diagnosis not present

## 2011-05-30 DIAGNOSIS — I509 Heart failure, unspecified: Secondary | ICD-10-CM | POA: Diagnosis not present

## 2011-06-02 ENCOUNTER — Encounter: Payer: Self-pay | Admitting: Internal Medicine

## 2011-06-03 ENCOUNTER — Ambulatory Visit (INDEPENDENT_AMBULATORY_CARE_PROVIDER_SITE_OTHER): Payer: Medicare Other | Admitting: Internal Medicine

## 2011-06-03 ENCOUNTER — Encounter: Payer: Self-pay | Admitting: Internal Medicine

## 2011-06-03 VITALS — BP 157/85 | HR 83 | Temp 97.9°F | Wt 169.1 lb

## 2011-06-03 DIAGNOSIS — I10 Essential (primary) hypertension: Secondary | ICD-10-CM | POA: Diagnosis not present

## 2011-06-03 DIAGNOSIS — R5383 Other fatigue: Secondary | ICD-10-CM | POA: Insufficient documentation

## 2011-06-03 DIAGNOSIS — R053 Chronic cough: Secondary | ICD-10-CM

## 2011-06-03 DIAGNOSIS — I2510 Atherosclerotic heart disease of native coronary artery without angina pectoris: Secondary | ICD-10-CM | POA: Diagnosis not present

## 2011-06-03 DIAGNOSIS — R05 Cough: Secondary | ICD-10-CM

## 2011-06-03 DIAGNOSIS — R059 Cough, unspecified: Secondary | ICD-10-CM | POA: Diagnosis not present

## 2011-06-03 DIAGNOSIS — E119 Type 2 diabetes mellitus without complications: Secondary | ICD-10-CM | POA: Diagnosis not present

## 2011-06-03 DIAGNOSIS — E785 Hyperlipidemia, unspecified: Secondary | ICD-10-CM

## 2011-06-03 DIAGNOSIS — I350 Nonrheumatic aortic (valve) stenosis: Secondary | ICD-10-CM

## 2011-06-03 DIAGNOSIS — N182 Chronic kidney disease, stage 2 (mild): Secondary | ICD-10-CM | POA: Insufficient documentation

## 2011-06-03 DIAGNOSIS — R5381 Other malaise: Secondary | ICD-10-CM

## 2011-06-03 DIAGNOSIS — F329 Major depressive disorder, single episode, unspecified: Secondary | ICD-10-CM

## 2011-06-03 DIAGNOSIS — I359 Nonrheumatic aortic valve disorder, unspecified: Secondary | ICD-10-CM | POA: Diagnosis not present

## 2011-06-03 DIAGNOSIS — M810 Age-related osteoporosis without current pathological fracture: Secondary | ICD-10-CM

## 2011-06-03 HISTORY — DX: Chronic kidney disease, stage 2 (mild): N18.2

## 2011-06-03 LAB — GLUCOSE, CAPILLARY: Glucose-Capillary: 125 mg/dL — ABNORMAL HIGH (ref 70–99)

## 2011-06-03 MED ORDER — FREESTYLE SYSTEM KIT: 1.0000 | PACK | Status: DC | PRN

## 2011-06-03 MED ORDER — GUAIFENESIN ER 600 MG PO TB12: 1200.0000 mg | ORAL_TABLET | Freq: Two times a day (BID) | ORAL | Status: DC

## 2011-06-03 MED ORDER — METOPROLOL SUCCINATE ER 25 MG PO TB24: 25.0000 mg | ORAL_TABLET | Freq: Every day | ORAL | Status: DC

## 2011-06-03 NOTE — Assessment & Plan Note (Signed)
I verified that her simvastatin dose was OK with her amlodipine. LDL controlled and at goal.

## 2011-06-03 NOTE — Assessment & Plan Note (Signed)
She has the following sxs. Cough but improving and less productive since completed ABX course. +DOE but not new. Anorexia since admit. Weight loss. No energy. Has to walk slow. Dizzy since D/C. Feels unsteady. Head feels funny. HA.   She has no fever, falls, hypoglycemia, CP, dyspnea at rest, N/V/D.   Sxs are vague. Nothing on exam - the crackles I originally heard had cleared on repeat auscultation. Therefore, I did not elect to repeat the CXR esp since it had never shown a PNA at admit. She just appears tired. I suggested PT and she adamantly refused - "Didn't want to go through that again." I encouraged her to be as active as she can. She likes to read her Bible and watch TV. Goes out occassionally with her daughter to shop. Missed a big religous celebration recently bc was too tired. She had a TSH in 2012 that was nl. I can repeat it next visit but her etiology seems to be just multiple medical illness. Cont to follow.

## 2011-06-03 NOTE — Patient Instructions (Signed)
Restart your metoprolol but take only once a day.  Move around and get out as much as possible even if it means pushing your self a bit.  If the dizziness gets worse please call me.  See me in May.

## 2011-06-03 NOTE — Assessment & Plan Note (Addendum)
Last A1C was in Jan 2013 and 7.1. She is on Lantus and Novolog and metformin. Her meter broke so I gave her a new Rx and asked if [name] might have a meter to give her. Foot exam was done today and we are requesting optho records for the second time.

## 2011-06-03 NOTE — Assessment & Plan Note (Signed)
Valve nl on ECHO.

## 2011-06-03 NOTE — Assessment & Plan Note (Signed)
She states sxs are controlled on Paxil.

## 2011-06-03 NOTE — Assessment & Plan Note (Signed)
Her BB had been held 2/2 her resp status at D/C. Her Bp has trended up and her pul exam is normal so will restart. She cont on her ASA, statin.

## 2011-06-03 NOTE — Progress Notes (Signed)
  Subjective:    Patient ID: [name], female    DOB: [birth year], 66 y.o.   MRN: [id number]  HPI  Please see the A&P for the status of the pt's chronic medical problems.   Review of Systems  Constitutional: Positive for activity change, appetite change, fatigue and unexpected weight change. Negative for fever and chills.  Respiratory: Positive for cough and shortness of breath. Negative for chest tightness.   Cardiovascular: Negative for chest pain.  Gastrointestinal: Negative for nausea, vomiting and diarrhea.  Musculoskeletal: Positive for back pain.  Neurological: Positive for dizziness, weakness, light-headedness and headaches.  Psychiatric/Behavioral: Positive for sleep disturbance.       Objective:   Physical Exam  Constitutional: She is oriented to person, place, and time. She appears well-developed and well-nourished. No distress.  HENT:  Head: Normocephalic and atraumatic.  Right Ear: External ear normal.  Left Ear: External ear normal.  Nose: Nose normal.  Eyes: Conjunctivae are normal.  Neck: Neck supple. No tracheal deviation present.  Cardiovascular: Normal rate, regular rhythm and intact distal pulses.   Murmur heard. Pulmonary/Chest: Effort normal and breath sounds normal.       Initial crackles on L but cleared.  Musculoskeletal: She exhibits no edema.  Lymphadenopathy:    She has no cervical adenopathy.  Neurological: She is alert and oriented to person, place, and time.  Skin: Skin is warm and dry. She is not diaphoretic.  Psychiatric: She has a normal mood and affect. Her behavior is normal. Judgment and thought content normal.          Assessment & Plan:

## 2011-06-03 NOTE — Assessment & Plan Note (Signed)
Noted the compression fx on the CXR. On bisphosphonates. Cannot find the most recent 2012 DEXA so will request.

## 2011-06-03 NOTE — Assessment & Plan Note (Addendum)
High today bc her BB and HCTZ were being held. I am resuming her BB since she has CAD and BB has definite indication, but at a lower dose since she has some vague sxs of dizziness.

## 2011-06-04 ENCOUNTER — Encounter: Payer: Medicare Other | Admitting: Physician Assistant

## 2011-06-07 DIAGNOSIS — I509 Heart failure, unspecified: Secondary | ICD-10-CM | POA: Diagnosis not present

## 2011-06-07 DIAGNOSIS — J189 Pneumonia, unspecified organism: Secondary | ICD-10-CM | POA: Diagnosis not present

## 2011-06-07 DIAGNOSIS — I2510 Atherosclerotic heart disease of native coronary artery without angina pectoris: Secondary | ICD-10-CM | POA: Diagnosis not present

## 2011-06-07 DIAGNOSIS — I5030 Unspecified diastolic (congestive) heart failure: Secondary | ICD-10-CM | POA: Diagnosis not present

## 2011-06-07 DIAGNOSIS — E119 Type 2 diabetes mellitus without complications: Secondary | ICD-10-CM | POA: Diagnosis not present

## 2011-06-07 DIAGNOSIS — I10 Essential (primary) hypertension: Secondary | ICD-10-CM | POA: Diagnosis not present

## 2011-06-11 ENCOUNTER — Encounter: Payer: Self-pay | Admitting: Physician Assistant

## 2011-06-11 ENCOUNTER — Ambulatory Visit (INDEPENDENT_AMBULATORY_CARE_PROVIDER_SITE_OTHER): Payer: Medicare Other | Admitting: Physician Assistant

## 2011-06-11 VITALS — BP 140/68 | HR 74 | Ht 67.0 in | Wt 168.8 lb

## 2011-06-11 DIAGNOSIS — I359 Nonrheumatic aortic valve disorder, unspecified: Secondary | ICD-10-CM | POA: Diagnosis not present

## 2011-06-11 DIAGNOSIS — I350 Nonrheumatic aortic (valve) stenosis: Secondary | ICD-10-CM

## 2011-06-11 DIAGNOSIS — I10 Essential (primary) hypertension: Secondary | ICD-10-CM | POA: Diagnosis not present

## 2011-06-11 DIAGNOSIS — I421 Obstructive hypertrophic cardiomyopathy: Secondary | ICD-10-CM | POA: Insufficient documentation

## 2011-06-11 DIAGNOSIS — I2510 Atherosclerotic heart disease of native coronary artery without angina pectoris: Secondary | ICD-10-CM

## 2011-06-11 NOTE — Patient Instructions (Addendum)
Your physician recommends that you schedule a follow-up appointment in: 3 MONTHS WITH DR. WALL  NO CHANGES WERE MADE TODAY

## 2011-06-11 NOTE — Progress Notes (Signed)
[address] [place], [place]  [address] Phone: [phone] Fax:  [phone]  Date:  06/11/2011   Name:  [name]       DOB:  [birth year] MRN:  [id number]  PCP:  Larey Dresser, MD, MD  Primary Cardiologist:  Dr. Jenell Milliner  Primary Electrophysiologist:  None    History of Present Illness: [name] is a 66 y.o. female who returns for post hospital follow up.  She has a history of CAD, aortic stenosis, status post pericardial tissue AVR plus single-vessel CABG 06/2004 (SVG-RCA), hyperlipidemia, breast cancer, hypertension, diabetes.  Her AVR was complicated by postoperative stroke.  She has been lost to follow up.  She was recently admitted 3/27-4/1 secondary to community-acquired pneumonia.  Her initial presentation was suspicious for CHF.  However, she developed acute renal insufficiency with Lasix.  It was then decided that she presented with pneumonia.  Her diuresis was stopped and she was treated with antibiotics.  A follow up echocardiogram was obtained 05/22/11: Moderate LVH, EF 55-60%, dynamic obstruction at rest in the outflow tract with mid cavity obliteration, peak velocity 410 cm/s, peak gradient 67 mmHg, AVR okay, severe MAC, trivial MR, mild LAE.  She was seen by Dr. Verl Blalock in consultation due to hypoxia.  He did not feel that this is related to her outflow tract obstruction or congestive heart failure and it was more likely related to pneumonia.  Her beta blocker was held due to wheezing and rhonchi on exam due to her pneumonia.  Her creatinine improved by discharge.  She was seen by her PCP last week and her beta blocker was restarted.     Potassium  Date/Time Value Range Status  05/26/2011  6:05 AM 4.5  3.5-5.1 (mEq/L) Final     BUN  Date/Time Value Range Status  05/26/2011  6:05 AM 21  6-23 (mg/dL) Final     Creatinine, Ser  Date/Time Value Range Status  05/26/2011  6:05 AM 1.01  0.50-1.10 (mg/dL) Final     2D Echocardiogram  05/22/11: Study Conclusions  - Left ventricle: The cavity size was normal. Wall thickness was increased in a pattern of moderate LVH. Systolic function was normal. The estimated ejection fraction was in the range of 55% to 60%. There was dynamic obstruction at restin the outflow tract, with mid-cavity obliteration, a peak velocity of 410cm/sec, and a peak gradient of 11mmHg. - Aortic valve: By history patient has AVR. Normal appearing bioprothetic valve with no regurgitation - Mitral valve: Severe mitral annular calcification especially posteriorly. - Left atrium: The atrium was mildly dilated. - Atrial septum: No defect or patent foramen ovale was identified.   She is doing well.  Denies chest pain or significant dyspnea.  No orthopnea, PND or edema.  No syncope.  No fevers or cough.  Past Medical History  Diagnosis Date  . CAD (coronary artery disease) 2006    s/p CABG (5/06) w/ saphenous vein to RCA at time of AVR  . CVA (cerebral infarction) 2006    Post-op from AVR. Presumed embolic in nature. Carotid stenosis of R 60-79%. Repeat dopplers 4/10 no R stenosis and L stenosis of 1-29%.  . Hyperlipidemia     Mgmt with a statin  . Refusal of blood transfusions as patient is Jehovah's Witness   . Diverticulosis 2001  . Adenocarcinoma of breast 1996    Completed tamoxifen and had mastectomy.  . Osteoporosis 2006    DEXA 10/06 : L femur T -2.8, R -  2.7. Lumbar T -2.4. On bisphosphonates and  Calcium / Vit D.  . Hypertension     Requires 4 drug tx  . Diabetes mellitus 1992    Dx 04/25/1990. Now insulin dependent, started 2008. On ACEI.   Marland Kitchen Arthritis   . Depression     Controlled on Paxil  . Aortic stenosis, severe     s/p aortic valve replacement with porcine valve 06/2004.  ECHO 2010 EF 58%, LVH, diastolic dysfxn, Bioprostetic aoritc valve, mild AS. ECHO 2013 EF 60%, Nl aortic artificial valve, dynamic obstruction in the outflow tract   Class IIb rec for annual TTE after 5 yrs. She had a TTE  2013      Current Outpatient Prescriptions  Medication Sig Dispense Refill  . alendronate (FOSAMAX) 70 MG tablet Take 70 mg by mouth every 7 (seven) days. Take with a full glass of water on an empty stomach. Takes on Sundays       . amLODipine (NORVASC) 10 MG tablet Take 1 tablet (10 mg total) by mouth daily.  30 tablet  11  . aspirin 81 MG tablet Take 81 mg by mouth daily.        Marland Kitchen gabapentin (NEURONTIN) 300 MG capsule Take 1 capsule (300 mg total) by mouth 3 (three) times daily.  90 capsule  11  . glucose monitoring kit (FREESTYLE) monitoring kit 1 each by Does not apply route as needed for other.  1 each  0  . guaiFENesin (MUCINEX) 600 MG 12 hr tablet Take 2 tablets (1,200 mg total) by mouth 2 (two) times daily.  60 tablet  0  . ibuprofen (ADVIL,MOTRIN) 200 MG tablet Take 400 mg by mouth every 6 (six) hours as needed. For pain      . insulin aspart (NOVOLOG) 100 UNIT/ML injection Inject 8 Units into the skin 3 (three) times daily before meals. Inject 8 units before breakfast and lunch and dinner       . insulin glargine (LANTUS SOLOSTAR) 100 UNIT/ML injection Inject 24 Units into the skin at bedtime.  9 mL  11  . Insulin Pen Needle (PEN NEEDLES 3/16") 31G X 5 MM MISC 1 each by Does not apply route 4 (four) times daily.  100 each  11  . Lancets (FREESTYLE) lancets Use as instructed  100 each  11  . lisinopril (PRINIVIL,ZESTRIL) 40 MG tablet Take 1 tablet (40 mg total) by mouth daily.  30 tablet  11  . metFORMIN (GLUCOPHAGE) 1000 MG tablet Take 1 tablet (1,000 mg total) by mouth 2 (two) times daily with a meal.  64 tablet  6  . metoprolol succinate (TOPROL-XL) 25 MG 24 hr tablet Take 1 tablet (25 mg total) by mouth daily.  30 tablet  5  . PARoxetine (PAXIL) 20 MG tablet Take 20 mg by mouth every morning.      . Simethicone (GAS RELIEF PO) Take 2 tablets by mouth daily as needed. For gas      . simvastatin (ZOCOR) 20 MG tablet Take 1 tablet (20 mg total) by mouth at bedtime.  30 tablet  11     Allergies: No Known Allergies  History  Substance Use Topics  . Smoking status: Never Smoker   . Smokeless tobacco: Never Used  . Alcohol Use: Yes     Occasional beer, monthly      PHYSICAL EXAM: VS:  BP 140/68  Pulse 74  Ht $R'5\' 7"'Vv$  (1.702 m)  Wt 168 lb 12.8 oz (76.567 kg)  BMI  26.44 kg/m2 Well nourished, well developed, in no acute distress HEENT: normal Neck: no JVD Cardiac:  normal S1, S2; RRR; 2/6 systolic murmur along LSB Lungs:  clear to auscultation bilaterally, no wheezing, rhonchi or rales Abd: soft, nontender, no hepatomegaly Ext: no edema Skin: warm and dry Neuro:  CNs 2-12 intact, no focal abnormalities noted  EKG:  NSR, HR 74, LBBB, LVH with repol abnormality   ASSESSMENT AND PLAN:  1. HOCM (hypertrophic obstructive cardiomyopathy)  Stable.  Avoid agents that reduce preload (diuretics) unless necessary.  Follow up with Dr. Jenell Milliner in 3 mos.   2. CAD (coronary artery disease) s/p 1v CABG at AVR in 2006 No angina.  Continue ASA and statin.   3. Hypertension  Toprol just restarted.   Continue current Rx for now.   4. Aortic stenosis s/p Tissure AVR 2006  AVR ok on recent echo.      Signed, Richardson Dopp, PA-C  12:50 PM 06/11/2011

## 2011-06-14 DIAGNOSIS — I2510 Atherosclerotic heart disease of native coronary artery without angina pectoris: Secondary | ICD-10-CM | POA: Diagnosis not present

## 2011-06-14 DIAGNOSIS — I10 Essential (primary) hypertension: Secondary | ICD-10-CM | POA: Diagnosis not present

## 2011-06-14 DIAGNOSIS — I509 Heart failure, unspecified: Secondary | ICD-10-CM | POA: Diagnosis not present

## 2011-06-14 DIAGNOSIS — I5030 Unspecified diastolic (congestive) heart failure: Secondary | ICD-10-CM | POA: Diagnosis not present

## 2011-06-14 DIAGNOSIS — E119 Type 2 diabetes mellitus without complications: Secondary | ICD-10-CM | POA: Diagnosis not present

## 2011-06-14 DIAGNOSIS — J189 Pneumonia, unspecified organism: Secondary | ICD-10-CM | POA: Diagnosis not present

## 2011-07-08 ENCOUNTER — Encounter: Payer: Self-pay | Admitting: Internal Medicine

## 2011-07-08 ENCOUNTER — Ambulatory Visit: Payer: Medicare Other | Admitting: Internal Medicine

## 2011-07-24 ENCOUNTER — Encounter: Payer: Self-pay | Admitting: Ophthalmology

## 2011-07-24 ENCOUNTER — Ambulatory Visit (INDEPENDENT_AMBULATORY_CARE_PROVIDER_SITE_OTHER): Payer: Medicare Other | Admitting: Ophthalmology

## 2011-07-24 VITALS — BP 139/78 | HR 62 | Temp 98.1°F | Resp 20 | Ht 65.5 in | Wt 173.1 lb

## 2011-07-24 DIAGNOSIS — E119 Type 2 diabetes mellitus without complications: Secondary | ICD-10-CM

## 2011-07-24 DIAGNOSIS — Z0000 Encounter for general adult medical examination without abnormal findings: Secondary | ICD-10-CM

## 2011-07-24 DIAGNOSIS — I10 Essential (primary) hypertension: Secondary | ICD-10-CM

## 2011-07-24 LAB — POCT GLYCOSYLATED HEMOGLOBIN (HGB A1C): Hemoglobin A1C: 7.3

## 2011-07-24 LAB — GLUCOSE, CAPILLARY: Glucose-Capillary: 150 mg/dL — ABNORMAL HIGH (ref 70–99)

## 2011-07-24 MED ORDER — GLUCOSE BLOOD VI STRP: ORAL_STRIP | Status: DC

## 2011-07-24 MED ORDER — ACCU-CHEK FASTCLIX LANCETS MISC: 1.0000 | Freq: Three times a day (TID) | Status: DC

## 2011-07-24 NOTE — Patient Instructions (Addendum)
-  Please go see podiatrist, Ivin Booty can ask what the co-pay would be for the visit -Please make an effort to remember to take the lantus every evening -Try to get into a habit of walking 3-4 times a week
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
## 2011-07-24 NOTE — Assessment & Plan Note (Signed)
Patient's BP was mildly elevated but on repeat check it cam into normal range. No changes made to regimen.

## 2011-07-24 NOTE — Assessment & Plan Note (Addendum)
Foot exam done today. Patient has area of skin darkening between toes on left foot and has overgrown toenails that are curving over front of toe. Referred for podiatry visit and told patient we can get estimate of co-pay. Stressed importance of taking lantus daily in light of A1c worsening from 7.1 to 7.3.

## 2011-07-24 NOTE — Assessment & Plan Note (Signed)
Patient is concerned as to the copay for her colonoscopy. I told her we can defer colonoscopy in light of her other serious comorbidities.

## 2011-08-06 DIAGNOSIS — M2010 Hallux valgus (acquired), unspecified foot: Secondary | ICD-10-CM | POA: Diagnosis not present

## 2011-08-06 DIAGNOSIS — M79609 Pain in unspecified limb: Secondary | ICD-10-CM | POA: Diagnosis not present

## 2011-08-06 DIAGNOSIS — B351 Tinea unguium: Secondary | ICD-10-CM | POA: Diagnosis not present

## 2011-10-22 ENCOUNTER — Ambulatory Visit: Payer: Medicare Other | Admitting: Cardiology

## 2011-10-23 ENCOUNTER — Ambulatory Visit: Payer: Medicare Other | Admitting: Cardiology

## 2011-11-10 ENCOUNTER — Ambulatory Visit (INDEPENDENT_AMBULATORY_CARE_PROVIDER_SITE_OTHER): Payer: Medicare Other | Admitting: Internal Medicine

## 2011-11-10 ENCOUNTER — Telehealth: Payer: Self-pay | Admitting: *Deleted

## 2011-11-10 ENCOUNTER — Ambulatory Visit (HOSPITAL_COMMUNITY)
Admission: RE | Admit: 2011-11-10 | Discharge: 2011-11-10 | Disposition: A | Discharge status: Home / Self Care | Payer: Medicare Other | Source: Ambulatory Visit | Attending: Internal Medicine | Admitting: Internal Medicine

## 2011-11-10 ENCOUNTER — Encounter: Payer: Self-pay | Admitting: Internal Medicine

## 2011-11-10 VITALS — BP 134/80 | HR 89 | Temp 98.6°F | Wt 181.9 lb

## 2011-11-10 DIAGNOSIS — R350 Frequency of micturition: Secondary | ICD-10-CM

## 2011-11-10 DIAGNOSIS — E119 Type 2 diabetes mellitus without complications: Secondary | ICD-10-CM

## 2011-11-10 DIAGNOSIS — Z23 Encounter for immunization: Secondary | ICD-10-CM

## 2011-11-10 DIAGNOSIS — M79609 Pain in unspecified limb: Secondary | ICD-10-CM | POA: Insufficient documentation

## 2011-11-10 DIAGNOSIS — M7989 Other specified soft tissue disorders: Secondary | ICD-10-CM

## 2011-11-10 DIAGNOSIS — N182 Chronic kidney disease, stage 2 (mild): Secondary | ICD-10-CM

## 2011-11-10 LAB — CBC WITH DIFFERENTIAL/PLATELET
Basophils Absolute: 0 10*3/uL (ref 0.0–0.1)
Basophils Relative: 0 % (ref 0–1)
Eosinophils Absolute: 0.3 10*3/uL (ref 0.0–0.7)
Eosinophils Relative: 4 % (ref 0–5)
HCT: 37.4 % (ref 36.0–46.0)
Hemoglobin: 12.9 g/dL (ref 12.0–15.0)
Lymphocytes Relative: 27 % (ref 12–46)
Lymphs Abs: 2.4 10*3/uL (ref 0.7–4.0)
MCH: 31 pg (ref 26.0–34.0)
MCHC: 34.5 g/dL (ref 30.0–36.0)
MCV: 89.9 fL (ref 78.0–100.0)
Monocytes Absolute: 0.6 10*3/uL (ref 0.1–1.0)
Monocytes Relative: 7 % (ref 3–12)
Neutro Abs: 5.5 10*3/uL (ref 1.7–7.7)
Neutrophils Relative %: 62 % (ref 43–77)
Platelets: 299 10*3/uL (ref 150–400)
RBC: 4.16 MIL/uL (ref 3.87–5.11)
RDW: 14 % (ref 11.5–15.5)
WBC: 8.8 10*3/uL (ref 4.0–10.5)

## 2011-11-10 LAB — GLUCOSE, CAPILLARY: Glucose-Capillary: 241 mg/dL — ABNORMAL HIGH (ref 70–99)

## 2011-11-10 LAB — POCT GLYCOSYLATED HEMOGLOBIN (HGB A1C): Hemoglobin A1C: 8.3

## 2011-11-10 NOTE — Assessment & Plan Note (Signed)
Likely UTI but she is unable to give [place] a urine sample.  She denies any dysuria or burning sensation.  Will watch for now and repeat UA in 1 wk

## 2011-11-10 NOTE — Progress Notes (Addendum)
HPI: [name] is a 66 yo W with PMH of aortic stenosis s/p AVR, dynamic obstruction in the outflow tract seen on echo 04/2011, depression, DM II, HLP, HTN, breast cancer in 1996 in remission, CAD, CVA presents today for leg leg pain and swelling x 1 week in duration.  She states that she has chronic leg swelling but worsen on the left leg since last week.  She endorses some SOB.  She has a history of breast cancer in 1996.  She does not move around much at home and spend most of the day sitting in one place and occasionally moves around with her walker.  No recent long car ride or airplane ride.  She denies any personal or family history of blood clots and patient does not smoke.  She has not had any recent surgery in the past 3 months.  She denies any fever, chills, N/V, or chest pain.  ROS: as per HPI  PE: General: alert, well-developed, and cooperative to examination.  Lungs: normal respiratory effort, no accessory muscle use, normal breath sounds, no crackles, and no wheezes. Heart: normal rate, regular rhythm,+2 systolic, mechanical  murmur, no gallop, and no rub.  Abdomen: soft, non-tender, normal bowel sounds, no distention, no guarding, no rebound tenderness Msk: no joint swelling, no joint warmth, and no redness over joints.  Pulses: 2+ DP/PT pulses bilaterally Extremities: No cyanosis, clubbing, LLE +2 pitting edema>> RLE with mild increased in warmth and tenderness to palpation , no erythema noted Neurologic:nonfocal  Psych: Oriented X3, memory intact for recent and remote, normally interactive, good eye contact, not anxious appearing, and not depressed appearing.

## 2011-11-10 NOTE — Progress Notes (Signed)
Bilateral:  No evidence of DVT, superficial thrombosis, or Baker's Cyst.   

## 2011-11-10 NOTE — Assessment & Plan Note (Addendum)
Differential dx include: nephrotic syndrome, liver dz, chf, or DVT.  DVT is likely bc her revised geneva score is 11 points or more indicates high probability (74%).    1 point for age >58 3 points for unilateral limb pain 3 points for HR between 74-95  4 points for Pain on deep palpation of lower limb and unilateral edema  -Will get stat Venous Duplex of LE bilaterally -Patient is not hypoxic, O2 sat on RA was 94% -Will treat accordingly depending on her results

## 2011-11-10 NOTE — Telephone Encounter (Signed)
Daughter called stating she noticed swelling to her mothers left leg.  Onset a few days ago.  Also frequency at night time, has been up 10 - 12 times last night.  Denies SOB but does have some pain in leg. Hx CAD  Will see today

## 2011-11-10 NOTE — Patient Instructions (Addendum)
Will get leg ultrasound Will get urinalysis and labs today and I will call you with any abnormal results Make sure you follow up with Dr. Yolanda Bonine- [facility]  Follow up in AM

## 2011-11-11 ENCOUNTER — Ambulatory Visit (INDEPENDENT_AMBULATORY_CARE_PROVIDER_SITE_OTHER): Payer: Medicare Other | Admitting: Internal Medicine

## 2011-11-11 ENCOUNTER — Encounter: Payer: Self-pay | Admitting: Internal Medicine

## 2011-11-11 VITALS — BP 144/83 | HR 81 | Temp 98.6°F | Wt 180.6 lb

## 2011-11-11 DIAGNOSIS — R350 Frequency of micturition: Secondary | ICD-10-CM

## 2011-11-11 DIAGNOSIS — E119 Type 2 diabetes mellitus without complications: Secondary | ICD-10-CM | POA: Diagnosis not present

## 2011-11-11 DIAGNOSIS — M7989 Other specified soft tissue disorders: Secondary | ICD-10-CM

## 2011-11-11 LAB — COMPLETE METABOLIC PANEL WITH GFR
ALT: 8 U/L (ref 0–35)
AST: 10 U/L (ref 0–37)
Albumin: 3.9 g/dL (ref 3.5–5.2)
Alkaline Phosphatase: 102 U/L (ref 39–117)
BUN: 16 mg/dL (ref 6–23)
CO2: 30 mEq/L (ref 19–32)
Calcium: 9.3 mg/dL (ref 8.4–10.5)
Chloride: 100 mEq/L (ref 96–112)
Creat: 1.03 mg/dL (ref 0.50–1.10)
GFR, Est African American: 65 mL/min
GFR, Est Non African American: 56 mL/min — ABNORMAL LOW
Glucose, Bld: 212 mg/dL — ABNORMAL HIGH (ref 70–99)
Potassium: 4.2 mEq/L (ref 3.5–5.3)
Sodium: 138 mEq/L (ref 135–145)
Total Bilirubin: 0.3 mg/dL (ref 0.3–1.2)
Total Protein: 7.2 g/dL (ref 6.0–8.3)

## 2011-11-11 LAB — POCT URINALYSIS DIPSTICK
Bilirubin, UA: NEGATIVE
Glucose, UA: >=1000
Ketones, UA: NEGATIVE
Nitrite, UA: NEGATIVE
Protein, UA: >=300
Spec Grav, UA: 1.02
Urobilinogen, UA: 0.2
pH, UA: 6

## 2011-11-11 LAB — GLUCOSE, CAPILLARY: Glucose-Capillary: 215 mg/dL — ABNORMAL HIGH (ref 70–99)

## 2011-11-11 MED ORDER — AMOXICILLIN 500 MG PO CAPS: 500.0000 mg | ORAL_CAPSULE | Freq: Three times a day (TID) | ORAL | Status: AC

## 2011-11-11 NOTE — Patient Instructions (Addendum)
Take Amoxicillin $RemoveBeforeDE'500mg'[id number]$  one tablet three times daily Continue your current medications We sent for urine culture and will call you with any abnormal labs Continue to elevate your legs at least 45 minutes 2-3 times per day, above your heart level Follow up with Dr. Lynnae January in 2 weeks if your leg swelling does not improve

## 2011-11-11 NOTE — Assessment & Plan Note (Signed)
Urine dipstick showed trace leukocytes and she reports increase in urinary frequency -Will send for UA and culture -Treat with Amoxicillin $RemoveBeforeDE'500mg'[id number]$  TID x 10 days

## 2011-11-11 NOTE — Progress Notes (Signed)
HPI: Ms. Montrose is a 66 yo woman who was seen by me yesterday presents today to go over leg doppler result which is negative and also to give urine sample. She states that she has been elevating her legs and her swelling has decreased and she feels better.  No other complaints today.   ROS: as per HPI  PE: General: alert, well-developed, and cooperative to examination.  Lungs: normal respiratory effort, no accessory muscle use, normal breath sounds, no crackles, and no wheezes. Heart: normal rate, regular rhythm, +2 systolic, mechanical  murmur, no gallop, and no rub.  Abdomen: soft, non-tender, normal bowel sounds, no distention, no guarding, no rebound tenderness Msk: no joint swelling, no joint warmth, and no redness over joints.  Pulses: 2+ DP/PT pulses bilaterally Extremities: , +2 pitting edema L>>R but improved from yesterday, +warmth and erythema Psych: Oriented X3, memory intact for recent and remote, normally interactive, good eye contact, not anxious appearing, and not depressed appearing.

## 2011-11-11 NOTE — Assessment & Plan Note (Signed)
Swelling is improving.  Neg for DVT.  Other ddx include cellulitis vs. Lymphatic obstruction.   -Will treat with 10 day course of Amoxicillin $RemoveBefore'500mg'[id number]$  tid -If no improvement, patient is instructed to follow up with Dr. Lynnae January in 2 weeks and may need to order CT scan to look for lymphatic obstruction -Leg elevations

## 2011-11-12 LAB — URINALYSIS, MICROSCOPIC ONLY
Bacteria, UA: NONE SEEN
Casts: NONE SEEN
Crystals: NONE SEEN

## 2011-11-12 LAB — URINALYSIS, ROUTINE W REFLEX MICROSCOPIC
Bilirubin Urine: NEGATIVE
Glucose, UA: >1000 mg/dL — AB
Ketones, ur: NEGATIVE mg/dL
Leukocytes, UA: NEGATIVE
Nitrite: NEGATIVE
Protein, ur: 100 mg/dL — AB
Specific Gravity, Urine: 1.022 (ref 1.005–1.030)
Urobilinogen, UA: 0.2 mg/dL (ref 0.0–1.0)
pH: 6 (ref 5.0–8.0)

## 2011-11-13 LAB — URINE CULTURE: Colony Count: 1000

## 2012-03-01 ENCOUNTER — Other Ambulatory Visit: Payer: Self-pay | Admitting: Internal Medicine

## 2012-03-02 ENCOUNTER — Other Ambulatory Visit: Payer: Self-pay | Admitting: Internal Medicine

## 2012-03-02 NOTE — Telephone Encounter (Signed)
Pls sch appt with me in next 90 days. Thanks

## 2012-03-05 ENCOUNTER — Other Ambulatory Visit: Payer: Self-pay | Admitting: *Deleted

## 2012-03-05 DIAGNOSIS — E119 Type 2 diabetes mellitus without complications: Secondary | ICD-10-CM

## 2012-03-05 NOTE — Telephone Encounter (Signed)
*   Pharmacy needs a hand written Rx with Dx code.

## 2012-03-08 ENCOUNTER — Encounter: Payer: Self-pay | Admitting: Internal Medicine

## 2012-03-08 NOTE — Telephone Encounter (Signed)
Rx faxed in.

## 2012-03-12 ENCOUNTER — Other Ambulatory Visit: Payer: Self-pay | Admitting: Internal Medicine

## 2012-03-12 MED ORDER — INSULIN DETEMIR 100 UNIT/ML ~~LOC~~ SOLN: 24.0000 [IU] | Freq: Every day | SUBCUTANEOUS | Status: DC

## 2012-03-16 ENCOUNTER — Ambulatory Visit (INDEPENDENT_AMBULATORY_CARE_PROVIDER_SITE_OTHER): Payer: Medicare Other | Admitting: Internal Medicine

## 2012-03-16 ENCOUNTER — Encounter: Payer: Medicare Other | Admitting: Internal Medicine

## 2012-03-16 ENCOUNTER — Encounter: Payer: Self-pay | Admitting: Internal Medicine

## 2012-03-16 VITALS — BP 137/80 | HR 78 | Temp 98.3°F | Resp 20 | Ht 65.0 in | Wt 177.3 lb

## 2012-03-16 DIAGNOSIS — N058 Unspecified nephritic syndrome with other morphologic changes: Secondary | ICD-10-CM

## 2012-03-16 DIAGNOSIS — E119 Type 2 diabetes mellitus without complications: Secondary | ICD-10-CM | POA: Diagnosis not present

## 2012-03-16 DIAGNOSIS — I2510 Atherosclerotic heart disease of native coronary artery without angina pectoris: Secondary | ICD-10-CM

## 2012-03-16 DIAGNOSIS — E1165 Type 2 diabetes mellitus with hyperglycemia: Secondary | ICD-10-CM

## 2012-03-16 DIAGNOSIS — E1129 Type 2 diabetes mellitus with other diabetic kidney complication: Secondary | ICD-10-CM | POA: Diagnosis not present

## 2012-03-16 DIAGNOSIS — I6350 Cerebral infarction due to unspecified occlusion or stenosis of unspecified cerebral artery: Secondary | ICD-10-CM

## 2012-03-16 DIAGNOSIS — C50919 Malignant neoplasm of unspecified site of unspecified female breast: Secondary | ICD-10-CM | POA: Diagnosis not present

## 2012-03-16 DIAGNOSIS — E785 Hyperlipidemia, unspecified: Secondary | ICD-10-CM | POA: Diagnosis not present

## 2012-03-16 DIAGNOSIS — K5900 Constipation, unspecified: Secondary | ICD-10-CM

## 2012-03-16 DIAGNOSIS — F329 Major depressive disorder, single episode, unspecified: Secondary | ICD-10-CM | POA: Diagnosis not present

## 2012-03-16 DIAGNOSIS — I359 Nonrheumatic aortic valve disorder, unspecified: Secondary | ICD-10-CM

## 2012-03-16 DIAGNOSIS — I10 Essential (primary) hypertension: Secondary | ICD-10-CM | POA: Diagnosis not present

## 2012-03-16 DIAGNOSIS — Z0000 Encounter for general adult medical examination without abnormal findings: Secondary | ICD-10-CM

## 2012-03-16 DIAGNOSIS — I421 Obstructive hypertrophic cardiomyopathy: Secondary | ICD-10-CM | POA: Diagnosis not present

## 2012-03-16 DIAGNOSIS — N182 Chronic kidney disease, stage 2 (mild): Secondary | ICD-10-CM | POA: Diagnosis not present

## 2012-03-16 DIAGNOSIS — I639 Cerebral infarction, unspecified: Secondary | ICD-10-CM

## 2012-03-16 DIAGNOSIS — IMO0002 Reserved for concepts with insufficient information to code with codable children: Secondary | ICD-10-CM

## 2012-03-16 DIAGNOSIS — F3289 Other specified depressive episodes: Secondary | ICD-10-CM

## 2012-03-16 DIAGNOSIS — I350 Nonrheumatic aortic (valve) stenosis: Secondary | ICD-10-CM

## 2012-03-16 LAB — GLUCOSE, CAPILLARY: Glucose-Capillary: 289 mg/dL — ABNORMAL HIGH (ref 70–99)

## 2012-03-16 LAB — LIPID PANEL
Cholesterol: 161 mg/dL (ref 0–200)
HDL: 33 mg/dL — ABNORMAL LOW (ref >39)
LDL Cholesterol: 77 mg/dL (ref 0–99)
Total CHOL/HDL Ratio: 4.9 Ratio
Triglycerides: 257 mg/dL — ABNORMAL HIGH (ref <150)
VLDL: 51 mg/dL — ABNORMAL HIGH (ref 0–40)

## 2012-03-16 LAB — POCT GLYCOSYLATED HEMOGLOBIN (HGB A1C): Hemoglobin A1C: 10.4

## 2012-03-16 MED ORDER — SENNA 8.6-50 MG PO TABS: 2.0000 | ORAL_TABLET | Freq: Every day | ORAL | Status: DC | PRN

## 2012-03-16 NOTE — Progress Notes (Signed)
  Subjective:    Patient ID: [name], female    DOB: 1945-05-02, 67 y.o.   MRN: [id number]  HPI  Please see the A&P for the status of the pt's chronic medical problems.   Review of Systems  Gastrointestinal: Positive for constipation. Negative for diarrhea and blood in stool.  Musculoskeletal: Positive for gait problem.  Skin: Negative for rash and wound.       Objective:   Physical Exam  Constitutional: She is oriented to person, place, and time. She appears well-developed and well-nourished. No distress.  HENT:  Head: Normocephalic and atraumatic.  Right Ear: External ear normal.  Left Ear: External ear normal.  Nose: Nose normal.  Eyes: Conjunctivae normal and EOM are normal.  Cardiovascular: Normal rate, regular rhythm and normal heart sounds.   Pulmonary/Chest: Effort normal and breath sounds normal. No respiratory distress.  Musculoskeletal: Normal range of motion. She exhibits edema. She exhibits no tenderness.  Neurological: She is alert and oriented to person, place, and time.  Skin: Skin is warm and dry. She is not diaphoretic.       Varicose veins B ankles  Psychiatric: She has a normal mood and affect. Her behavior is normal. Judgment and thought content normal.          Assessment & Plan:

## 2012-03-16 NOTE — Assessment & Plan Note (Signed)
Check LDL today as last checked one yr ago. Was controlled then. On statin.

## 2012-03-16 NOTE — Assessment & Plan Note (Signed)
Without any meds, she has 2 hard BM weekly. She does feel uncomfortable. She takes Hardin Negus MOM once weekly and that results in BM the next day that is soft. We discussed repeat colon and she wants to wait and think about it. No colon cancer in her family. No blood in stool. Trial of Senna and colace. To call if no better.

## 2012-03-16 NOTE — Assessment & Plan Note (Signed)
Lab Results  Component Value Date   HGBA1C 10.4 03/16/2012   HGBA1C 8.3 11/10/2011   HGBA1C 7.3 07/24/2011     Assessment:  Diabetes control: poor control (HgbA1C >9%)  Progress toward A1C goal:  deteriorated  Comments: Plan:  Medications:  continue current medications  Home glucose monitoring:   Frequency: 3 times a day   Timing: before meals  Instruction/counseling given: discussed diet  Educational resources provided: brochure  Self management tools provided: home glucose logbook  Other plans:  Not doing as well as she has in past. "Not making effort" like she used to. Used to check CBG's QID. Now once weekly. Maybe some dietary indiscretion over holidays. Has not picked up the Levemir yet. Not able to see feet to do foot check - will get daughter to check feet. Had seen pod once and was rec to get diabetic shoes. I filled out form today. A1C increased to 10.4 from 8.3. I discussed leaving meds as is and focusing on diet or increasing insulin. Pt wanted to leave meds as is and try to modify diet. She will increase CBG to QID. I will ask Christene Slates is she can call pt in a few weeks and check CBG progress. Pt ahd to switch from Lantus to Levemir 2/2 insurance. F/U 3 months.

## 2012-03-16 NOTE — Assessment & Plan Note (Signed)
Had cards consult March 2013 and ECHO OK.

## 2012-03-16 NOTE — Assessment & Plan Note (Signed)
We reviewed all outstanding HM and she wants to wait, mostly 2/2 money issues.

## 2012-03-16 NOTE — Assessment & Plan Note (Signed)
She is on ASA. Her stroke left L leg weakness.   LUE : hand, wrist, elbow 5/5. Shoulder abduction and extension 3/5. LLE : toes and ankle 4/5, knee extension / flexion 3+/5, Hip flexion 3/5.  She lives in a ground floor apt. There are some steps with hand rail up to her apt but no ramp. 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 Currently, she can stand for about 5 min and do light cleaning / cooking. She cannot stand up much longer than 5 min. She uses a walker. 2 falls in past yr. Daughter helps her into tub. Has riser for toilet seat so no problem with it. Uses walker and / or store's WC when out. We discussed pros / cons of an electric WC. She will think and will make appt with me and [facility] if she decides to go forward. She would use WC to get around apt.

## 2012-03-16 NOTE — Assessment & Plan Note (Signed)
Cont to take her alendronate.

## 2012-03-16 NOTE — Assessment & Plan Note (Signed)
BP Readings from Last 3 Encounters:  03/16/12 137/80  11/11/11 144/83  11/10/11 134/80    Lab Results  Component Value Date   NA 138 11/10/2011   K 4.2 11/10/2011   CREATININE 1.03 11/10/2011    Assessment:  Blood pressure control: controlled  Progress toward BP goal:  at goal  Comments:   Plan:  Medications:  continue current medications  Educational resources provided: other (see comments) (None needed)  Self management tools provided:    Other plans: BP is at goal. On lisinopril, amlodipine, metoprolol.

## 2012-03-16 NOTE — Assessment & Plan Note (Signed)
Her HCTZ had been stopped 2013 hospital stay when HOCM found.

## 2012-03-16 NOTE — Assessment & Plan Note (Signed)
Cont to take her Paxil.

## 2012-03-16 NOTE — Patient Instructions (Addendum)
General Instructions:  Check your sugar 3 or 4 times daily.  [name] will call in a few weeks to see how your sugar is doing.  Call and make appointment for [facility] if you decide to proceed  I sent your diabetic shoe form in.  Try the new constipation pill. Call me if doesn't help.    Treatment Goals:  Goals (1 Years of Data) as of 03/16/2012          As of Today 11/11/11 11/10/11 07/24/11 07/24/11     Blood Pressure    . Blood Pressure < 130/80  137/80 144/83 134/80 139/78 142/75     Result Component    . HEMOGLOBIN A1C < 7.0  10.4  8.3 7.3     . LDL CALC < 100            Progress Toward Treatment Goals:  Treatment Goal 03/16/2012  Hemoglobin A1C deteriorated  Blood pressure at goal    Self Care Goals & Plans:  Self Care Goal 03/16/2012  Manage my medications take my medicines as prescribed; refill my medications on time; bring my medications to every visit  Monitor my health keep track of my blood glucose  Eat healthy foods eat baked foods instead of fried foods; drink diet soda or water instead of juice or soda; eat fruit for snacks and desserts    Home Blood Glucose Monitoring 03/16/2012  Check my blood sugar 3 times a day  When to check my blood sugar before meals     Care Management & Community Referrals:  Referral 03/16/2012  Referrals made for care management support diabetes educator

## 2012-03-16 NOTE — Assessment & Plan Note (Signed)
Due for MMG but wants to wait for now.

## 2012-03-16 NOTE — Assessment & Plan Note (Signed)
BB, ASA, statin.

## 2012-03-17 ENCOUNTER — Encounter: Payer: Self-pay | Admitting: Internal Medicine

## 2012-03-17 ENCOUNTER — Other Ambulatory Visit: Payer: Self-pay | Admitting: *Deleted

## 2012-03-17 DIAGNOSIS — E119 Type 2 diabetes mellitus without complications: Secondary | ICD-10-CM

## 2012-03-17 NOTE — Telephone Encounter (Signed)
Fax from pharmacy - new rx needs directions/dx code. Thanks

## 2012-03-18 MED ORDER — GLUCOSE BLOOD VI STRP: ORAL_STRIP | Status: DC

## 2012-04-01 ENCOUNTER — Telehealth: Payer: Self-pay | Admitting: Dietician

## 2012-04-02 NOTE — Telephone Encounter (Signed)
Patient returned calls. Says she has been checking blood sugar ~ 2x a day but hasn't written them all down to give me over the phone and doesn't know how to get them for her meter's memory. She reports no lows or highs since on new insulin. Range from her memory has been ~ 115>250. She feel she is doing well on new insulin.  CBGs that she'd written down are:  04/02/12 fasting 193 03/28/12  191 (11am)   160 (405 pm) 03/25/12  126 (1114 am)   127 (825 pm) 03/23/12 149 (1217 am)    116 (8 pm) 03/22/12    161 (930 am)

## 2012-04-07 ENCOUNTER — Other Ambulatory Visit: Payer: Self-pay | Admitting: *Deleted

## 2012-04-07 ENCOUNTER — Other Ambulatory Visit: Payer: Self-pay | Admitting: Internal Medicine

## 2012-04-07 DIAGNOSIS — E119 Type 2 diabetes mellitus without complications: Secondary | ICD-10-CM

## 2012-04-07 MED ORDER — GLUCOSE BLOOD VI STRP: ORAL_STRIP | Status: DC

## 2012-04-07 MED ORDER — ACCU-CHEK FASTCLIX LANCETS MISC: 1.0000 | Freq: Three times a day (TID) | Status: DC

## 2012-04-28 ENCOUNTER — Encounter: Payer: Self-pay | Admitting: Internal Medicine

## 2012-05-29 ENCOUNTER — Encounter: Payer: Self-pay | Admitting: Internal Medicine

## 2012-05-29 DIAGNOSIS — R269 Unspecified abnormalities of gait and mobility: Secondary | ICD-10-CM | POA: Insufficient documentation

## 2012-06-08 ENCOUNTER — Encounter: Payer: Self-pay | Admitting: Internal Medicine

## 2012-06-08 ENCOUNTER — Ambulatory Visit (INDEPENDENT_AMBULATORY_CARE_PROVIDER_SITE_OTHER): Payer: Medicare Other | Admitting: Internal Medicine

## 2012-06-08 VITALS — BP 152/88 | HR 88 | Temp 97.1°F | Ht 65.0 in | Wt 177.9 lb

## 2012-06-08 DIAGNOSIS — I10 Essential (primary) hypertension: Secondary | ICD-10-CM

## 2012-06-08 DIAGNOSIS — I359 Nonrheumatic aortic valve disorder, unspecified: Secondary | ICD-10-CM | POA: Diagnosis not present

## 2012-06-08 DIAGNOSIS — N058 Unspecified nephritic syndrome with other morphologic changes: Secondary | ICD-10-CM

## 2012-06-08 DIAGNOSIS — R269 Unspecified abnormalities of gait and mobility: Secondary | ICD-10-CM | POA: Diagnosis not present

## 2012-06-08 DIAGNOSIS — E1165 Type 2 diabetes mellitus with hyperglycemia: Secondary | ICD-10-CM

## 2012-06-08 DIAGNOSIS — E1129 Type 2 diabetes mellitus with other diabetic kidney complication: Secondary | ICD-10-CM

## 2012-06-08 DIAGNOSIS — K5900 Constipation, unspecified: Secondary | ICD-10-CM | POA: Diagnosis not present

## 2012-06-08 DIAGNOSIS — I350 Nonrheumatic aortic (valve) stenosis: Secondary | ICD-10-CM

## 2012-06-08 DIAGNOSIS — Z1211 Encounter for screening for malignant neoplasm of colon: Secondary | ICD-10-CM

## 2012-06-08 DIAGNOSIS — I2510 Atherosclerotic heart disease of native coronary artery without angina pectoris: Secondary | ICD-10-CM

## 2012-06-08 LAB — GLUCOSE, CAPILLARY: Glucose-Capillary: 221 mg/dL — ABNORMAL HIGH (ref 70–99)

## 2012-06-08 LAB — POCT GLYCOSYLATED HEMOGLOBIN (HGB A1C): Hemoglobin A1C: 9.7

## 2012-06-08 NOTE — Assessment & Plan Note (Signed)
The first dose of Senna Docusate worked well but then not so much. She was taking 2 QD. Will encourage her to take QD even good days and can increase to max 4 pills QD. She does not want colonoscopy 2/2 cost.

## 2012-06-08 NOTE — Progress Notes (Signed)
  Subjective:    Patient ID: [name], female    DOB: 12/04/1945, 67 y.o.   MRN: [id number]  HPI  Please see the A&P for the status of the pt's chronic medical problems.   Review of Systems  Gastrointestinal: Positive for abdominal pain and constipation. Negative for blood in stool.  Genitourinary: Positive for urgency and frequency. Negative for dysuria, flank pain, decreased urine volume and difficulty urinating.  Musculoskeletal: Positive for gait problem.       Objective:   Physical Exam  Constitutional: She is oriented to person, place, and time. She appears well-developed and well-nourished. No distress.  HENT:  Head: Normocephalic and atraumatic.  Right Ear: External ear normal.  Left Ear: External ear normal.  Nose: Nose normal.  Eyes: Conjunctivae and EOM are normal.  Cardiovascular: Normal rate and regular rhythm.   Murmur heard. Late systolic 4  Pulmonary/Chest: Effort normal and breath sounds normal.  Abdominal: Soft. Bowel sounds are normal.  Musculoskeletal: She exhibits edema.  +1 L > R  Neurological: She is alert and oriented to person, place, and time.  Skin: Skin is warm and dry. She is not diaphoretic.  Psychiatric: She has a normal mood and affect. Her behavior is normal. Judgment and thought content normal.          Assessment & Plan:

## 2012-06-08 NOTE — Assessment & Plan Note (Signed)
Agreed to get MMG scheduled.

## 2012-06-08 NOTE — Assessment & Plan Note (Signed)
Asymptomatic. On BB, ASA, statin.

## 2012-06-08 NOTE — Assessment & Plan Note (Addendum)
BP Readings from Last 3 Encounters:  06/08/12 152/88  03/16/12 137/80  11/11/11 144/83    Lab Results  Component Value Date   NA 138 11/10/2011   K 4.2 11/10/2011   CREATININE 1.03 11/10/2011    Assessment: Blood pressure control: mildly elevated Progress toward BP goal:  deteriorated Comments:   Plan: Medications:    Educational resources provided:   Self management tools provided: home blood pressure logbook Other plans: Her repeat BP was also elevated, even above JNC 8's rec for older pts. On max norvasc, max lisinopril, Toprol 25, diuretics were rec to be avioded 2/2 HOCM. Her HR can tolerate an increase in her BB so will rec that she increase to Toprol 50 QD. She will need to come in for repeat BP in 4 weeks.

## 2012-06-08 NOTE — Assessment & Plan Note (Signed)
Her murmur seemed to be late systolic which could be bad sign. However, her ECHO one year ago showed a nl artifical valve. She describes no other sxs. Will need ECHO later this year based on the recs.

## 2012-06-08 NOTE — Patient Instructions (Addendum)
General Instructions: 1. Please take the metformin only twice a day 2. I am referring you to neurorehab for  Gait assessment to prevent falls  Discussion of getting a wheelchair 3, We are scheduling a mammogram 4. Please check your sugar before and after every meal and before bedtime for THREE days. Please write the levels down. [name] will call you to get the results.  5. The trouble with your urine might just be the high sugar level.   Treatment Goals:  Goals (1 Years of Data) as of 06/08/12         As of Today 03/16/12 11/11/11 11/10/11 07/24/11     Blood Pressure    . Blood Pressure < 130/80  149/74 137/80 144/83 134/80 139/78     Lifestyle    . Prevent Falls  Yes         Result Component    . HEMOGLOBIN A1C < 7.0   10.4  8.3 7.3    . LDL CALC < 100   77         Progress Toward Treatment Goals:  Treatment Goal 06/08/2012  Hemoglobin A1C unable to assess  Blood pressure deteriorated    Self Care Goals & Plans:  Self Care Goal 06/08/2012  Manage my medications take my medicines as prescribed; bring my medications to every visit; refill my medications on time  Monitor my health keep track of my blood glucose; bring my glucose meter and log to each visit; keep track of my blood pressure  Eat healthy foods drink diet soda or water instead of juice or soda; eat more vegetables; eat foods that are low in salt  Be physically active take a walk every day  Other [place] Blood Glucose Monitoring 06/08/2012  Check my blood sugar 3 times a day  When to check my blood sugar after lunch     Care Management & Community Referrals:  Referral 06/08/2012  Referrals made for care management support none needed

## 2012-06-08 NOTE — Assessment & Plan Note (Addendum)
Lab Results  Component Value Date   HGBA1C 9.7 06/08/2012   HGBA1C 10.4 03/16/2012   HGBA1C 8.3 11/10/2011     Assessment: Diabetes control: poor control (HgbA1C >9%) Progress toward A1C goal:  unable to assess Comments:   Plan: Medications:  continue current medications Home glucose monitoring: Frequency: 3 times a day Timing: after lunch Instruction/counseling given: other instruction/counseling: See below Educational resources provided: brochure Self management tools provided:   Other plans: She has poor control. She is only 66. Would like to get better control and have asked that she do intensive CBG testing for three days and report results to [name] so that we can adjust her insulin. She is on 24 Levemir and 8 Novolog with meals - 50/50 basal and prandial. Her calculated TTD is 96 units so have room to increase both if needed.  Of note, she stated she was taking metformin 1000 mg TID. I instructed her written and verbally to take it only BID.   She did not want to pay the copay for optho exam.   Donna's note : She did not call me back. I agree with correction dose added for her to her base novolog of 8 units and if that doesn't work then possibly a small increase 1-2 units in her Levemir. Wonder if she is getting pens for her levemir? she needs them for accurate dosing due to her weakness from her stroke. Would be sure of her using pens prior to dose change.  Calculated correction is 1:36 at current dose, would use 1:50 to make it simple and to start such as <200 use 8, 201-250 inject 10, 251-300 inject 12 or < 150 use 8, 151-200 inject 9 and so on. May eventually have to change correction to 1:35 or more.

## 2012-06-08 NOTE — Assessment & Plan Note (Signed)
Fall Screening 03/16/2012 06/08/2012  Falls in the past year? Yes No  Number of falls in past year 2 or more -  Risk Factor Category  High Fall Risk -      Assessment: Progress toward fall prevention goal:    Comments:   Plan: Fall prevention plans: She has had no falls in past yr. But uses walker and uses store's scooters. She is afraid of falling. Lives single story without stairs with her daughter and wants to remain independent. Agrees to go to [employer].   The past two visits we have also discussed Powerchair. She is uncertain about getting one but is agreeable to talking to neurrehab and undergoing the assessment. She is concerned about the cost to her. Her kids' cars do not have device to transport it. She is uncertain how much she would use it in her home so will think about that before consult.  Educational resources provided:   Self management tools provided:

## 2012-06-15 ENCOUNTER — Ambulatory Visit (HOSPITAL_COMMUNITY): Payer: Medicare Other

## 2012-06-16 ENCOUNTER — Telehealth: Payer: Self-pay | Admitting: Dietician

## 2012-06-22 NOTE — Telephone Encounter (Signed)
Before breakfast   Before Lunch   After Lunch  Before Dinner Monday       91(1136 am)        112 (640 PM) Tuesday  198(10AM) [id number]  272(536UY) Thursday  115(836)        403(474QV)  240(430pm) Friday       214(120PM) Saturday 136(716am) Sunday Monday 182(905am)  tuesday 164    Hasn't checked yet today because she hasn't eaten lunch yet.   Dizzi with high sugars last Thursday, denies symptoms of low blood sugar. Denies missing nay insulin. Confirmed same doses listed in her chart. Patient agreed to check before meal and beditme from today to Friday am and we'd talk again Friday afternoon to review her findings.

## 2012-06-29 ENCOUNTER — Ambulatory Visit (HOSPITAL_COMMUNITY): Payer: Medicare Other

## 2012-06-30 NOTE — Telephone Encounter (Signed)
She did not call me back. I agree with correction dose added for her to her base novolog of 8 units and if that doesn't work then possibly a small increase 1-2 units in her Levemir. Wonder if she is getting pens for her levemir?  she needs them for accurate dosing due to her weakness from her stroke. Would be sure of her using pens prior to dose change.   Calculated correction is 1:36 at current dose, would use 1:50  to make it simple and to start  such as  <200 use 8, 201-250 inject 10, 251-300 inject 12 or   < 150 use 8, 151-200 inject 9 and so on. May eventually have to change correction to 1:35 or more.

## 2012-06-30 NOTE — Telephone Encounter (Signed)
[name], Thanks for all your help with my pt's insulin dosing. [name] is on She is on 16 Levemir and 8 Novolog with meals - daily dose 50/50 basal and prandial. Her calculated TTD is 96 units so have room to increase both if needed. Did ms [name] call you back? Her numbers are all over the place. Is she one that you would recommend a correction dose for those really high pre-prandial results? Thanks!

## 2012-07-02 ENCOUNTER — Ambulatory Visit: Payer: Medicare Other | Attending: Internal Medicine | Admitting: Physical Therapy

## 2012-07-02 DIAGNOSIS — IMO0001 Reserved for inherently not codable concepts without codable children: Secondary | ICD-10-CM | POA: Insufficient documentation

## 2012-07-02 DIAGNOSIS — R269 Unspecified abnormalities of gait and mobility: Secondary | ICD-10-CM | POA: Insufficient documentation

## 2012-07-02 DIAGNOSIS — I69998 Other sequelae following unspecified cerebrovascular disease: Secondary | ICD-10-CM | POA: Insufficient documentation

## 2012-07-02 DIAGNOSIS — R29898 Other symptoms and signs involving the musculoskeletal system: Secondary | ICD-10-CM | POA: Diagnosis not present

## 2012-07-06 ENCOUNTER — Ambulatory Visit: Payer: Medicare Other | Admitting: Physical Therapy

## 2012-07-09 ENCOUNTER — Ambulatory Visit: Payer: Medicare Other | Admitting: *Deleted

## 2012-07-12 ENCOUNTER — Ambulatory Visit: Payer: Medicare Other

## 2012-07-16 ENCOUNTER — Ambulatory Visit: Payer: Medicare Other | Admitting: *Deleted

## 2012-07-21 ENCOUNTER — Ambulatory Visit: Payer: Medicare Other | Admitting: Physical Therapy

## 2012-07-23 ENCOUNTER — Ambulatory Visit: Payer: Medicare Other | Admitting: *Deleted

## 2012-07-27 ENCOUNTER — Ambulatory Visit: Payer: Medicare Other | Attending: Internal Medicine | Admitting: Physical Therapy

## 2012-07-27 DIAGNOSIS — R29898 Other symptoms and signs involving the musculoskeletal system: Secondary | ICD-10-CM | POA: Diagnosis not present

## 2012-07-27 DIAGNOSIS — R269 Unspecified abnormalities of gait and mobility: Secondary | ICD-10-CM | POA: Insufficient documentation

## 2012-07-27 DIAGNOSIS — IMO0001 Reserved for inherently not codable concepts without codable children: Secondary | ICD-10-CM | POA: Insufficient documentation

## 2012-07-27 DIAGNOSIS — I69998 Other sequelae following unspecified cerebrovascular disease: Secondary | ICD-10-CM | POA: Insufficient documentation

## 2012-07-29 ENCOUNTER — Ambulatory Visit: Payer: Medicare Other | Admitting: Physical Therapy

## 2012-08-02 ENCOUNTER — Ambulatory Visit: Payer: Medicare Other | Admitting: Physical Therapy

## 2012-08-04 ENCOUNTER — Ambulatory Visit: Payer: Medicare Other | Admitting: Physical Therapy

## 2012-08-04 ENCOUNTER — Other Ambulatory Visit: Payer: Self-pay | Admitting: Internal Medicine

## 2012-08-09 ENCOUNTER — Ambulatory Visit: Payer: Medicare Other | Admitting: Physical Therapy

## 2012-08-11 ENCOUNTER — Ambulatory Visit: Payer: Medicare Other | Admitting: Physical Therapy

## 2012-08-16 ENCOUNTER — Ambulatory Visit: Payer: Medicare Other | Admitting: Physical Therapy

## 2012-08-18 ENCOUNTER — Ambulatory Visit: Payer: Medicare Other | Admitting: Physical Therapy

## 2012-08-23 ENCOUNTER — Ambulatory Visit: Payer: Medicare Other | Admitting: Physical Therapy

## 2012-08-24 ENCOUNTER — Encounter: Payer: Medicare Other | Admitting: Internal Medicine

## 2012-08-25 ENCOUNTER — Ambulatory Visit: Payer: Medicare Other | Attending: Internal Medicine | Admitting: Physical Therapy

## 2012-08-25 DIAGNOSIS — R269 Unspecified abnormalities of gait and mobility: Secondary | ICD-10-CM | POA: Diagnosis not present

## 2012-08-25 DIAGNOSIS — IMO0001 Reserved for inherently not codable concepts without codable children: Secondary | ICD-10-CM | POA: Insufficient documentation

## 2012-08-25 DIAGNOSIS — R29898 Other symptoms and signs involving the musculoskeletal system: Secondary | ICD-10-CM | POA: Insufficient documentation

## 2012-08-25 DIAGNOSIS — I69998 Other sequelae following unspecified cerebrovascular disease: Secondary | ICD-10-CM | POA: Diagnosis not present

## 2012-09-02 ENCOUNTER — Other Ambulatory Visit: Payer: Self-pay

## 2012-09-03 ENCOUNTER — Other Ambulatory Visit: Payer: Self-pay | Admitting: Internal Medicine

## 2012-09-28 ENCOUNTER — Encounter: Payer: Medicare Other | Admitting: Internal Medicine

## 2012-10-12 ENCOUNTER — Encounter: Payer: Medicare Other | Admitting: Internal Medicine

## 2012-10-19 ENCOUNTER — Encounter: Payer: Self-pay | Admitting: Internal Medicine

## 2012-10-19 ENCOUNTER — Ambulatory Visit (INDEPENDENT_AMBULATORY_CARE_PROVIDER_SITE_OTHER): Payer: Medicare Other | Admitting: Internal Medicine

## 2012-10-19 VITALS — BP 144/82 | HR 68 | Temp 98.0°F | Wt 177.1 lb

## 2012-10-19 DIAGNOSIS — E1165 Type 2 diabetes mellitus with hyperglycemia: Secondary | ICD-10-CM

## 2012-10-19 DIAGNOSIS — M810 Age-related osteoporosis without current pathological fracture: Secondary | ICD-10-CM | POA: Diagnosis not present

## 2012-10-19 DIAGNOSIS — Z23 Encounter for immunization: Secondary | ICD-10-CM

## 2012-10-19 DIAGNOSIS — E1129 Type 2 diabetes mellitus with other diabetic kidney complication: Secondary | ICD-10-CM

## 2012-10-19 DIAGNOSIS — I10 Essential (primary) hypertension: Secondary | ICD-10-CM

## 2012-10-19 DIAGNOSIS — R269 Unspecified abnormalities of gait and mobility: Secondary | ICD-10-CM

## 2012-10-19 DIAGNOSIS — N058 Unspecified nephritic syndrome with other morphologic changes: Secondary | ICD-10-CM

## 2012-10-19 DIAGNOSIS — I2510 Atherosclerotic heart disease of native coronary artery without angina pectoris: Secondary | ICD-10-CM | POA: Diagnosis not present

## 2012-10-19 DIAGNOSIS — I359 Nonrheumatic aortic valve disorder, unspecified: Secondary | ICD-10-CM

## 2012-10-19 DIAGNOSIS — IMO0002 Reserved for concepts with insufficient information to code with codable children: Secondary | ICD-10-CM

## 2012-10-19 DIAGNOSIS — C50919 Malignant neoplasm of unspecified site of unspecified female breast: Secondary | ICD-10-CM

## 2012-10-19 DIAGNOSIS — I6350 Cerebral infarction due to unspecified occlusion or stenosis of unspecified cerebral artery: Secondary | ICD-10-CM | POA: Diagnosis not present

## 2012-10-19 DIAGNOSIS — I639 Cerebral infarction, unspecified: Secondary | ICD-10-CM

## 2012-10-19 DIAGNOSIS — I350 Nonrheumatic aortic (valve) stenosis: Secondary | ICD-10-CM

## 2012-10-19 LAB — HM DIABETES EYE EXAM

## 2012-10-19 NOTE — Patient Instructions (Addendum)
Please make an appt with Dr Lynnae January in next 2 months to discuss your diabetes and other medical issues. When you are able, please schedule a mammogram Once you get a meter, please check your sugar four times a day and call Christene Slates with the results so that we may change your insulin  For your blood pressure : Lisinopril  Norvasc (amlodipine) Metoprolol (Toprol)  For your sugar  Metformin insulin   For cholesterol Simvastatin (zocor)  For depression Paxil (paroxetine)

## 2012-10-19 NOTE — Assessment & Plan Note (Signed)
Her last A1C was uncontrolled. She is on Levemir 24 and novolog 8 TID. She checks her CBG QID but recently has not been able as her meter is acting up. [name] will work with her to get a new meter.   Foot exam today. Eye exam today. Flu vaccination today.  I asked that once she gets her meter, to check her CBG's QID and call [name] with the results so that we can adjust the insulin.

## 2012-10-19 NOTE — Assessment & Plan Note (Signed)
We complete the powerchair eval today.  She is on an ASA.

## 2012-10-19 NOTE — Assessment & Plan Note (Signed)
She cont to take her alendronate.

## 2012-10-19 NOTE — Assessment & Plan Note (Signed)
Has had three falls. Has completed Neuro rehab. Powerchair completed today.

## 2012-10-19 NOTE — Assessment & Plan Note (Signed)
She is asymptomatic and on ASA, BB, and statin.

## 2012-10-19 NOTE — Assessment & Plan Note (Addendum)
I mentioned that it is time to repeat her ECHO but 2/2 financial restraints, she is unable to complete this at this time.

## 2012-10-19 NOTE — Assessment & Plan Note (Signed)
Repeat BP today was at the most recent JNC 8 goal. Therefore, no change in meds.

## 2012-10-19 NOTE — Assessment & Plan Note (Signed)
She was unable to get a MMG 2/2 financial restraints.

## 2012-10-19 NOTE — Progress Notes (Signed)
  Subjective:    Patient ID: [name], female    DOB: 1945-03-25, 67 y.o.   MRN: [id number]  HPI  Mobility assessment for Motorized Wheelchair - Patient notes a galt abnormality and weakness since 2006, after a post op CVA with residual L sided weakness. Secondary to this incident, she has had significant difficulty with mobility, and has required a wheel walker. Secondary to her limitations, patient has impaired mobility-related ADLs including, but not limited to ambulation, toileting and cooking.  she cannot use a cane, walker, or manual wheelchair because of R shoulder pain, 5-7 out of 10 in intensity.The patient cannot safely transfer into and out of a scooter (POV) because of her significant R shoulder pain. The patient is alert and cooperative and feels that she could safely operate a power wheelchair in the home to complete her ADL's. The patient is willing and motivated to use the power wheelchair.   I have reviewed and agreed with the PT evaluation and recommendations.   For the above-mentioned reasons, I agree that Thurnell Lose would benefit from motorized wheelchair. she has limitation in multiple mobility related activities of daily living and therefore, in my opinion would be a good candidate for motorized wheelchair.   Please see the A&P for the status of the pt's chronic medical problems.    Review of Systems  Constitutional: Negative for activity change and appetite change.  HENT: Negative for rhinorrhea and sneezing.   Eyes: Positive for discharge.  Respiratory: Negative for shortness of breath.   Cardiovascular: Negative for chest pain.  Gastrointestinal: Positive for diarrhea and constipation. Negative for blood in stool.  Genitourinary: Negative for dysuria and difficulty urinating.  Musculoskeletal: Positive for gait problem.  Skin: Negative for rash.  Neurological: Positive for weakness.  Psychiatric/Behavioral: Negative for sleep disturbance.        Objective:   Physical Exam  Constitutional: She is oriented to person, place, and time. She appears well-developed and well-nourished. No distress.  HENT:  Head: Normocephalic and atraumatic.  Right Ear: External ear normal.  Left Ear: External ear normal.  Nose: Nose normal.  Neurological: She is alert and oriented to person, place, and time.  Skin: She is not diaphoretic.  Psychiatric: She has a normal mood and affect. Her behavior is normal. Judgment and thought content normal.          Assessment & Plan:

## 2012-10-20 ENCOUNTER — Telehealth: Payer: Self-pay | Admitting: *Deleted

## 2012-10-20 NOTE — Telephone Encounter (Signed)
Called pt for [name] Plyler -  Left message on home ID recording for pt to call number on back of [employer] meter for new meter per [facility]. Hilda Blades Gisell Buehrle RN 10/20/12 10:30AM

## 2012-11-18 NOTE — Addendum Note (Signed)
Addended by: Truddie Crumble on: 11/18/2012 01:59 PM   Modules accepted: Orders

## 2013-03-05 ENCOUNTER — Other Ambulatory Visit: Payer: Self-pay | Admitting: Internal Medicine

## 2013-03-07 NOTE — Telephone Encounter (Signed)
Last seen Aug. Pls sch appt with me next 3 months. thanks

## 2013-03-30 ENCOUNTER — Telehealth: Payer: Self-pay | Admitting: Dietician

## 2013-03-30 ENCOUNTER — Other Ambulatory Visit: Payer: Self-pay | Admitting: Internal Medicine

## 2013-03-30 DIAGNOSIS — E119 Type 2 diabetes mellitus without complications: Secondary | ICD-10-CM

## 2013-03-30 NOTE — Telephone Encounter (Signed)
Has March appt.   [name] - pt wasn't controlled last visit 2014. Would you be willing to call her and motivate her to check CBG's prior to appt to assist with med adjustment? And sch appt with you if pt willing. Thank!

## 2013-03-30 NOTE — Telephone Encounter (Signed)
Called patient about checking her blood sugars more frequently and appointment with CDE. Patient says he meter broke and she hasn't been checking her blood sugars for a while. She agreed to same day appointment with CDE on 05/10/13.

## 2013-03-31 MED ORDER — ACCU-CHEK SOFTCLIX LANCET DEV MISC: Status: DC

## 2013-03-31 MED ORDER — GLUCOSE BLOOD VI STRP: ORAL_STRIP | Status: DC

## 2013-03-31 MED ORDER — ACCU-CHEK AVIVA PLUS W/DEVICE KIT: PACK | Status: DC

## 2013-03-31 NOTE — Telephone Encounter (Signed)
Yes. I think they are in this note.

## 2013-03-31 NOTE — Telephone Encounter (Signed)
Thanks [name]. Do I need to send in new Rx for meter?

## 2013-04-01 ENCOUNTER — Other Ambulatory Visit: Payer: Self-pay | Admitting: Dietician

## 2013-04-01 DIAGNOSIS — E119 Type 2 diabetes mellitus without complications: Secondary | ICD-10-CM

## 2013-04-01 MED ORDER — ACCU-CHEK AVIVA PLUS W/DEVICE KIT: PACK | Status: DC

## 2013-04-01 MED ORDER — GLUCOSE BLOOD VI STRP: ORAL_STRIP | Status: DC

## 2013-04-01 MED ORDER — ACCU-CHEK SOFTCLIX LANCET DEV MISC: Status: DC

## 2013-04-01 NOTE — Addendum Note (Signed)
Addended by: Resa Miner on: 04/01/2013 11:04 AM   Modules accepted: Orders

## 2013-04-01 NOTE — Telephone Encounter (Signed)
Prescriptions are not going through per pharmacy. They suggested having another provider send them.

## 2013-04-01 NOTE — Telephone Encounter (Signed)
Patient needs new meter/

## 2013-04-01 NOTE — Telephone Encounter (Signed)
Strips and lancets were covered for ~ $35.00 for both. Meter was not covered. Called patient and asked her if she wanted to wait until mOnday to get a meter from our office vs pay out of pocket for it. She wants to wait until Monday and also mentioned she is completely out of Novolog and it was > 100.00 for it which she cannot afford. (she used to pay 6$) She did not know who her Medicare D plan was with or if she had a deductible this year) advised her to find out by calling Medicare. She requests sample of Novolog until she can find out if there is a less expensive replacement for Novolog.

## 2013-04-04 NOTE — Telephone Encounter (Signed)
Please let me know what insulin she wants and I will Rx.

## 2013-04-05 NOTE — Telephone Encounter (Addendum)
Patient's daughter picked up Accu chek Nano sample and Novolog sample today. Triage nurse called in strips for this meter- Accu smartview strips and Fastclick lancets. It was explained to her that this sample would only last her mother 12 days and that patient/family needs determine her [place] and find out about her deductible and preferred insulin brands. She understands that patient insulin will be changed at their request.

## 2013-04-22 ENCOUNTER — Other Ambulatory Visit: Payer: Self-pay | Admitting: Internal Medicine

## 2013-04-22 ENCOUNTER — Other Ambulatory Visit: Payer: Self-pay | Admitting: Dietician

## 2013-04-22 DIAGNOSIS — IMO0002 Reserved for concepts with insufficient information to code with codable children: Secondary | ICD-10-CM

## 2013-04-22 DIAGNOSIS — E1129 Type 2 diabetes mellitus with other diabetic kidney complication: Secondary | ICD-10-CM

## 2013-04-22 DIAGNOSIS — E119 Type 2 diabetes mellitus without complications: Secondary | ICD-10-CM

## 2013-04-22 DIAGNOSIS — E1165 Type 2 diabetes mellitus with hyperglycemia: Secondary | ICD-10-CM

## 2013-04-22 MED ORDER — GLUCOSE BLOOD VI STRP: ORAL_STRIP | Status: DC

## 2013-04-22 MED ORDER — ACCU-CHEK FASTCLIX LANCETS MISC: Status: DC

## 2013-04-22 NOTE — Telephone Encounter (Signed)
Patient called requesting prescriptions for strips and lancets for accu chek nano(smaetview strips and fastclick lancets) and Novolog Flexpen  be sent to [facility] on [address]. She needs them ASAP because is out.

## 2013-04-25 MED ORDER — INSULIN ASPART 100 UNIT/ML FLEXPEN: 8.0000 [IU] | PEN_INJECTOR | Freq: Three times a day (TID) | SUBCUTANEOUS | Status: DC

## 2013-04-27 ENCOUNTER — Other Ambulatory Visit: Payer: Self-pay | Admitting: Dietician

## 2013-04-27 DIAGNOSIS — E119 Type 2 diabetes mellitus without complications: Secondary | ICD-10-CM

## 2013-04-27 NOTE — Telephone Encounter (Signed)
6 month supply sent 2/[facility] to [facility] and EPIC records it was received by pharm.

## 2013-04-27 NOTE — Telephone Encounter (Signed)
Patient did not get testing supplies. She requests prescription be sent to [facility] on [address]

## 2013-04-27 NOTE — Telephone Encounter (Signed)
Called [facility] per pt request who called again and could not get to get her test strips again this afternoon:

## 2013-05-02 NOTE — Telephone Encounter (Signed)
Called patient to verify she got testing supplies- she did. Also informed her of her upcoming appointments here.

## 2013-05-10 ENCOUNTER — Ambulatory Visit (INDEPENDENT_AMBULATORY_CARE_PROVIDER_SITE_OTHER): Payer: Medicare Other | Admitting: Internal Medicine

## 2013-05-10 ENCOUNTER — Encounter: Payer: Self-pay | Admitting: Internal Medicine

## 2013-05-10 ENCOUNTER — Encounter: Payer: Self-pay | Admitting: Dietician

## 2013-05-10 ENCOUNTER — Ambulatory Visit (INDEPENDENT_AMBULATORY_CARE_PROVIDER_SITE_OTHER): Payer: Medicare Other | Admitting: Dietician

## 2013-05-10 ENCOUNTER — Other Ambulatory Visit: Payer: Self-pay | Admitting: Dietician

## 2013-05-10 VITALS — BP 165/89 | HR 74 | Temp 97.2°F | Wt 175.6 lb

## 2013-05-10 DIAGNOSIS — E1129 Type 2 diabetes mellitus with other diabetic kidney complication: Secondary | ICD-10-CM

## 2013-05-10 DIAGNOSIS — IMO0002 Reserved for concepts with insufficient information to code with codable children: Secondary | ICD-10-CM

## 2013-05-10 DIAGNOSIS — I2510 Atherosclerotic heart disease of native coronary artery without angina pectoris: Secondary | ICD-10-CM | POA: Diagnosis not present

## 2013-05-10 DIAGNOSIS — E785 Hyperlipidemia, unspecified: Secondary | ICD-10-CM | POA: Diagnosis not present

## 2013-05-10 DIAGNOSIS — E1165 Type 2 diabetes mellitus with hyperglycemia: Secondary | ICD-10-CM | POA: Diagnosis not present

## 2013-05-10 DIAGNOSIS — N182 Chronic kidney disease, stage 2 (mild): Secondary | ICD-10-CM

## 2013-05-10 DIAGNOSIS — R269 Unspecified abnormalities of gait and mobility: Secondary | ICD-10-CM | POA: Diagnosis not present

## 2013-05-10 DIAGNOSIS — F3289 Other specified depressive episodes: Secondary | ICD-10-CM | POA: Diagnosis not present

## 2013-05-10 DIAGNOSIS — F329 Major depressive disorder, single episode, unspecified: Secondary | ICD-10-CM

## 2013-05-10 DIAGNOSIS — Z0000 Encounter for general adult medical examination without abnormal findings: Secondary | ICD-10-CM

## 2013-05-10 DIAGNOSIS — N058 Unspecified nephritic syndrome with other morphologic changes: Secondary | ICD-10-CM

## 2013-05-10 DIAGNOSIS — I10 Essential (primary) hypertension: Secondary | ICD-10-CM

## 2013-05-10 DIAGNOSIS — M810 Age-related osteoporosis without current pathological fracture: Secondary | ICD-10-CM

## 2013-05-10 DIAGNOSIS — I129 Hypertensive chronic kidney disease with stage 1 through stage 4 chronic kidney disease, or unspecified chronic kidney disease: Secondary | ICD-10-CM

## 2013-05-10 LAB — BASIC METABOLIC PANEL WITH GFR
BUN: 9 mg/dL (ref 6–23)
CO2: 32 mEq/L (ref 19–32)
Calcium: 9 mg/dL (ref 8.4–10.5)
Chloride: 102 mEq/L (ref 96–112)
Creat: 0.86 mg/dL (ref 0.50–1.10)
GFR, Est African American: 80 mL/min
GFR, Est Non African American: 69 mL/min
Glucose, Bld: 162 mg/dL — ABNORMAL HIGH (ref 70–99)
Potassium: 3.8 mEq/L (ref 3.5–5.3)
Sodium: 141 mEq/L (ref 135–145)

## 2013-05-10 LAB — LIPID PANEL
Cholesterol: 148 mg/dL (ref 0–200)
HDL: 30 mg/dL — ABNORMAL LOW (ref >39)
LDL Cholesterol: 68 mg/dL (ref 0–99)
Total CHOL/HDL Ratio: 4.9 Ratio
Triglycerides: 248 mg/dL — ABNORMAL HIGH (ref <150)
VLDL: 50 mg/dL — ABNORMAL HIGH (ref 0–40)

## 2013-05-10 LAB — POCT GLYCOSYLATED HEMOGLOBIN (HGB A1C): Hemoglobin A1C: 8.5

## 2013-05-10 LAB — GLUCOSE, CAPILLARY: Glucose-Capillary: 142 mg/dL — ABNORMAL HIGH (ref 70–99)

## 2013-05-10 MED ORDER — METFORMIN HCL 1000 MG PO TABS: ORAL_TABLET | ORAL | Status: DC

## 2013-05-10 MED ORDER — GABAPENTIN 300 MG PO CAPS: 300.0000 mg | ORAL_CAPSULE | Freq: Three times a day (TID) | ORAL | Status: DC

## 2013-05-10 MED ORDER — LORATADINE 10 MG PO TABS: 10.0000 mg | ORAL_TABLET | Freq: Every day | ORAL | Status: DC | PRN

## 2013-05-10 MED ORDER — METOPROLOL SUCCINATE ER 25 MG PO TB24: 25.0000 mg | ORAL_TABLET | Freq: Every day | ORAL | Status: DC

## 2013-05-10 MED ORDER — ALENDRONATE SODIUM 70 MG PO TABS: 70.0000 mg | ORAL_TABLET | ORAL | Status: DC

## 2013-05-10 MED ORDER — INSULIN DETEMIR 100 UNIT/ML FLEXPEN: 24.0000 [IU] | PEN_INJECTOR | Freq: Every day | SUBCUTANEOUS | Status: DC

## 2013-05-10 MED ORDER — ASPIRIN 81 MG PO TBEC: 81.0000 mg | DELAYED_RELEASE_TABLET | Freq: Every day | ORAL | Status: DC

## 2013-05-10 MED ORDER — LISINOPRIL 40 MG PO TABS: 40.0000 mg | ORAL_TABLET | Freq: Every day | ORAL | Status: DC

## 2013-05-10 MED ORDER — SIMVASTATIN 20 MG PO TABS: 20.0000 mg | ORAL_TABLET | Freq: Every day | ORAL | Status: DC

## 2013-05-10 MED ORDER — INSULIN ASPART 100 UNIT/ML FLEXPEN: 8.0000 [IU] | PEN_INJECTOR | Freq: Three times a day (TID) | SUBCUTANEOUS | Status: DC

## 2013-05-10 MED ORDER — AMLODIPINE BESYLATE 10 MG PO TABS: 10.0000 mg | ORAL_TABLET | Freq: Every day | ORAL | Status: DC

## 2013-05-10 NOTE — Addendum Note (Signed)
Addended by: Larey Dresser A on: 05/10/2013 11:43 AM   Modules accepted: Orders

## 2013-05-10 NOTE — Progress Notes (Signed)
   Subjective:    Patient ID: [name], female    DOB: 11/01/1945, 68 y.o.   MRN: [id number]  HPI  Please see the A&P for the status of the pt's chronic medical problems.   Review of Systems  Constitutional: Positive for appetite change.  HENT: Positive for congestion, postnasal drip, rhinorrhea and sneezing.   Eyes: Positive for discharge and itching.  Respiratory: Positive for cough. Negative for shortness of breath.   Cardiovascular: Negative for chest pain.  Gastrointestinal: Negative for nausea and vomiting.  Endocrine: Positive for polyuria.  Genitourinary: Negative for dysuria.  Musculoskeletal: Positive for gait problem and myalgias. Negative for arthralgias.  Skin: Negative for pallor.  Neurological: Positive for weakness and headaches.  Psychiatric/Behavioral: Negative for sleep disturbance.       Objective:   Physical Exam  Constitutional: She is oriented to person, place, and time. She appears well-developed and well-nourished. No distress.  HENT:  Head: Normocephalic and atraumatic.  Right Ear: External ear normal.  Left Ear: External ear normal.  Nose: Nose normal.  Eyes: Conjunctivae and EOM are normal.  Neck: Normal range of motion. Neck supple.  Cardiovascular: Normal rate and regular rhythm.   Murmur heard. Pulmonary/Chest: Effort normal and breath sounds normal. No respiratory distress.  Musculoskeletal: Normal range of motion. She exhibits edema.  Lymphadenopathy:    She has no cervical adenopathy.  Neurological: She is alert and oriented to person, place, and time.  Skin: Skin is warm and dry. She is not diaphoretic.  Psychiatric: She has a normal mood and affect. Her behavior is normal. Judgment and thought content normal.          Assessment & Plan:

## 2013-05-10 NOTE — Assessment & Plan Note (Signed)
Remains on ASA.

## 2013-05-10 NOTE — Assessment & Plan Note (Signed)
Check BMP today 

## 2013-05-10 NOTE — Assessment & Plan Note (Signed)
Check FLP today. She is on Zocor 20 and I filled today

## 2013-05-10 NOTE — Progress Notes (Signed)
Medical Nutrition Therapy:  Appt start time: 1000 end time:  1015.  Assessment:  Primary concerns today: Blood sugar control and Annual Review.  Patient well know from previous visits. Here with daughter and grand-daughter, weight stable Usual eating pattern includes 2-3 meals and 0-1 snacks per day. Beverage is diet soda and is working on drinking more water   24-hr recall: (Up at 9-11 AM) B (11 AM)-egg omelet with onions, mushrooms and 6 oz juice and coffee or cereal coffee and juice- sometimes has peaches in cereal and skips juice     L (3-4 PM)- Fried Chicken, potato salad, collard greens or somedays grilled cheese sandwich D ( 6 PM) has this if she eats breakfast earlier or a snack midday of fruit   Usual physical activity includes activities of daily living - she is limited by hemiparalysis from a stroke. Medications- 24 units levemir once daily in evening, 8 units Novolog before meals- skips if she skips a meal  Self Monitoring- brought meter- download showed most readings in target. No symptoms of low blood sugar reported.     Labs- A1C improved- anticipate continued improvement  Progress Towards Goal(s):  Some progress.   Nutritional Diagnosis:  NB-1.4 Self-monitoring deficit As related to difficulty obtaining supplies and competing values.  As evidenced by her reportand lack of blood sugars in meter and higher A1C.    Intervention:  Nutrition education/reveiw about signs and symptoms and appropriate treatment of hypoglycemia. Patient encouraged to call for questions or concerns.  Monitoring/Evaluation:  Dietary intake, exercise, meter,blood sugars, and body weight in 3 month(s).

## 2013-05-10 NOTE — Assessment & Plan Note (Signed)
Remains on ASA< BB, statin. Check FLP today. No sxs.

## 2013-05-10 NOTE — Assessment & Plan Note (Signed)
Cont on Alendronate. No falls

## 2013-05-10 NOTE — Assessment & Plan Note (Addendum)
A bit elevated today but previously better. Her meds are maxed except for BB. Due to new JNC 8 goals, and high fall risk, will leave meds as is and recheck 3 months. Norvasc 10, Toprol 25, Lisinopril 40  BP Readings from Last 3 Encounters:  05/10/13 165/89  10/19/12 144/82  06/08/12 152/88    Lab Results  Component Value Date   NA 138 11/10/2011   K 4.2 11/10/2011   CREATININE 1.03 11/10/2011    Assessment: Blood pressure control: mildly elevated Progress toward BP goal:  deteriorated Comments: See above  Plan: Medications:  continue current medications Educational resources provided: brochure Self management tools provided: home blood pressure logbook Other plans: See above

## 2013-05-10 NOTE — Assessment & Plan Note (Signed)
She does have DOE - whenever she tries to do too much. But hard to tease out if 2/2 overal deconditioning or AS. No angina. No dizziness / syncope. Declines ECHO today 2/2 co pay.

## 2013-05-10 NOTE — Assessment & Plan Note (Signed)
She uses Levemir 24 QHS and Novolog 8 units with meals. A1C is 8.5 - higher than I would like but is slowly trending down from 9.7 from 10.4. No lows. Will leaves doses as is and F/U 3 months.  Lab Results  Component Value Date   HGBA1C 8.5 05/10/2013   HGBA1C 9.7 06/08/2012   HGBA1C 10.4 03/16/2012     Assessment: Diabetes control: fair control Progress toward A1C goal:  improved Comments: See above   Plan: Medications:  continue current medications Home glucose monitoring: Frequency: 4 times a day Timing: before meals;at bedtime Instruction/counseling given: reminded to bring blood glucose meter & log to each visit and reminded to bring medications to each visit Educational resources provided: brochure Self management tools provided: copy of home glucose meter download Other plans: See above

## 2013-05-10 NOTE — Assessment & Plan Note (Signed)
Cont on Paxil.

## 2013-05-10 NOTE — Assessment & Plan Note (Signed)
Offered Colonoscopy, MMG, and ECHO - pt declined.  Gave paper Rx for Shinlges. Has had shingles once before.  "head cold" for one week. Rhinorrhea, PND, itchy watery eyes, some congestion. States gets yearly. Not on any allergy meds. Trial of claritin as she preferred PO to nasal spray. To call if no better.

## 2013-05-10 NOTE — Patient Instructions (Addendum)
1. Try the claritin for your head cold as I think it might be allergies. Call me if no better in one to two weeks 2. Your sugar is slowly getting better.  3. Thank about the shingles vaccine - can get at a pharmacy 4. Call me if you want to schedule to colonoscopy, mammogram, or heart ECHO 5. Your blood pressure is up a bit. I will need to recheck in three months 6. i will mail you your blood test results 7. See me in three months    Treatment Goals:  Goals (1 Years of Data) as of 05/10/13         As of Today As of Today 10/19/12 10/19/12 06/08/12     Blood Pressure    . Blood Pressure < 130/80  165/89 162/88 144/82 151/77 152/88     Lifestyle    . Prevent Falls      Yes     Result Component    . HEMOGLOBIN A1C < 7.0  8.5    9.7    . LDL CALC < 100            Progress Toward Treatment Goals:  Treatment Goal 05/10/2013  Hemoglobin A1C improved  Blood pressure deteriorated    Self Care Goals & Plans:  Self Care Goal 05/10/2013  Manage my medications take my medicines as prescribed; refill my medications on time  Monitor my health keep track of my blood glucose; bring my glucose meter and log to each visit  Eat healthy foods eat baked foods instead of fried foods; eat foods that are low in salt; drink diet soda or water instead of juice or soda  Be physically active -  Other -    Home Blood Glucose Monitoring 05/10/2013  Check my blood sugar 4 times a day  When to check my blood sugar before meals; at bedtime     Care Management & Community Referrals:  Referral 05/10/2013  Referrals made for care management support none needed

## 2013-05-10 NOTE — Assessment & Plan Note (Signed)
Has powerchair. Uses in home and around neighborhood. No way to take to stores / appts. Lives with daughter. I in dressing / bathing. Daughter does cooking and cleaning. Does walk a bit in house.

## 2013-05-10 NOTE — Progress Notes (Unsigned)
Suggest MNT for CKD and diabetes and diabetes self management training. Please order if you agree.

## 2013-05-11 ENCOUNTER — Encounter: Payer: Self-pay | Admitting: Internal Medicine

## 2013-07-12 ENCOUNTER — Emergency Department (HOSPITAL_COMMUNITY)
Admission: EM | Admit: 2013-07-12 | Discharge: 2013-07-12 | Disposition: A | Discharge status: Home / Self Care | Payer: Medicare Other | Attending: Emergency Medicine | Admitting: Emergency Medicine

## 2013-07-12 ENCOUNTER — Emergency Department (HOSPITAL_COMMUNITY): Payer: Medicare Other

## 2013-07-12 ENCOUNTER — Encounter (HOSPITAL_COMMUNITY): Payer: Self-pay | Admitting: Emergency Medicine

## 2013-07-12 DIAGNOSIS — S92919A Unspecified fracture of unspecified toe(s), initial encounter for closed fracture: Secondary | ICD-10-CM | POA: Diagnosis not present

## 2013-07-12 DIAGNOSIS — Y9389 Activity, other specified: Secondary | ICD-10-CM | POA: Insufficient documentation

## 2013-07-12 DIAGNOSIS — I10 Essential (primary) hypertension: Secondary | ICD-10-CM | POA: Diagnosis not present

## 2013-07-12 DIAGNOSIS — E119 Type 2 diabetes mellitus without complications: Secondary | ICD-10-CM | POA: Diagnosis not present

## 2013-07-12 DIAGNOSIS — M129 Arthropathy, unspecified: Secondary | ICD-10-CM | POA: Diagnosis not present

## 2013-07-12 DIAGNOSIS — W1809XA Striking against other object with subsequent fall, initial encounter: Secondary | ICD-10-CM | POA: Insufficient documentation

## 2013-07-12 DIAGNOSIS — Z7982 Long term (current) use of aspirin: Secondary | ICD-10-CM | POA: Insufficient documentation

## 2013-07-12 DIAGNOSIS — F329 Major depressive disorder, single episode, unspecified: Secondary | ICD-10-CM | POA: Diagnosis not present

## 2013-07-12 DIAGNOSIS — S92502B Displaced unspecified fracture of left lesser toe(s), initial encounter for open fracture: Secondary | ICD-10-CM

## 2013-07-12 DIAGNOSIS — E785 Hyperlipidemia, unspecified: Secondary | ICD-10-CM | POA: Insufficient documentation

## 2013-07-12 DIAGNOSIS — Z79899 Other long term (current) drug therapy: Secondary | ICD-10-CM | POA: Diagnosis not present

## 2013-07-12 DIAGNOSIS — S93336A Other dislocation of unspecified foot, initial encounter: Secondary | ICD-10-CM | POA: Diagnosis not present

## 2013-07-12 DIAGNOSIS — S91109A Unspecified open wound of unspecified toe(s) without damage to nail, initial encounter: Secondary | ICD-10-CM | POA: Diagnosis not present

## 2013-07-12 DIAGNOSIS — F3289 Other specified depressive episodes: Secondary | ICD-10-CM | POA: Diagnosis not present

## 2013-07-12 DIAGNOSIS — Z951 Presence of aortocoronary bypass graft: Secondary | ICD-10-CM | POA: Insufficient documentation

## 2013-07-12 DIAGNOSIS — IMO0002 Reserved for concepts with insufficient information to code with codable children: Secondary | ICD-10-CM | POA: Diagnosis not present

## 2013-07-12 DIAGNOSIS — Z8673 Personal history of transient ischemic attack (TIA), and cerebral infarction without residual deficits: Secondary | ICD-10-CM | POA: Diagnosis not present

## 2013-07-12 DIAGNOSIS — Z794 Long term (current) use of insulin: Secondary | ICD-10-CM | POA: Insufficient documentation

## 2013-07-12 DIAGNOSIS — S92919B Unspecified fracture of unspecified toe(s), initial encounter for open fracture: Secondary | ICD-10-CM | POA: Insufficient documentation

## 2013-07-12 DIAGNOSIS — Z23 Encounter for immunization: Secondary | ICD-10-CM | POA: Diagnosis not present

## 2013-07-12 DIAGNOSIS — I2510 Atherosclerotic heart disease of native coronary artery without angina pectoris: Secondary | ICD-10-CM | POA: Diagnosis not present

## 2013-07-12 DIAGNOSIS — Y9289 Other specified places as the place of occurrence of the external cause: Secondary | ICD-10-CM | POA: Insufficient documentation

## 2013-07-12 MED ORDER — CEPHALEXIN 500 MG PO CAPS: 500.0000 mg | ORAL_CAPSULE | Freq: Four times a day (QID) | ORAL | Status: DC

## 2013-07-12 MED ORDER — TETANUS-DIPHTH-ACELL PERTUSSIS 5-2.5-18.5 LF-MCG/0.5 IM SUSP
0.5000 mL | Freq: Once | INTRAMUSCULAR | Status: AC
  Administered 2013-07-12: 0.5 mL via INTRAMUSCULAR
  Filled 2013-07-12: qty 0.5

## 2013-07-12 MED ORDER — CEFAZOLIN SODIUM 1-5 GM-% IV SOLN
1.0000 g | Freq: Once | INTRAVENOUS | Status: AC
  Administered 2013-07-12: 1 g via INTRAVENOUS
  Filled 2013-07-12: qty 50

## 2013-07-12 MED ORDER — TRAMADOL HCL 50 MG PO TABS: 50.0000 mg | ORAL_TABLET | Freq: Four times a day (QID) | ORAL | Status: DC | PRN

## 2013-07-12 NOTE — Discharge Instructions (Signed)
Cast or Splint Care Casts and splints support injured limbs and keep bones from moving while they heal.  HOME CARE  Keep the cast or splint uncovered during the drying period.  A plaster cast can take 24 to 48 hours to dry.  A fiberglass cast will dry in less than 1 hour.  Do not rest the cast on anything harder than a pillow for 24 hours.  Do not put weight on your injured limb. Do not put pressure on the cast. Wait for your doctor's approval.  Keep the cast or splint dry.  Cover the cast or splint with a plastic bag during baths or wet weather.  If you have a cast over your chest and belly (trunk), take sponge baths until the cast is taken off.  If your cast gets wet, dry it with a towel or blow dryer. Use the cool setting on the blow dryer.  Keep your cast or splint clean. Wash a dirty cast with a damp cloth.  Do not put any objects under your cast or splint.  Do not scratch the skin under the cast with an object. If itching is a problem, use a blow dryer on a cool setting over the itchy area.  Do not trim or cut your cast.  Do not take out the padding from inside your cast.  Exercise your joints near the cast as told by your doctor.  Raise (elevate) your injured limb on 1 or 2 pillows for the first 1 to 3 days. GET HELP IF:  Your cast or splint cracks.  Your cast or splint is too tight or too loose.  You itch badly under the cast.  Your cast gets wet or has a soft spot.  You have a bad smell coming from the cast.  You get an object stuck under the cast.  Your skin around the cast becomes red or sore.  You have new or more pain after the cast is put on. GET HELP RIGHT AWAY IF:  You have fluid leaking through the cast.  You cannot move your fingers or toes.  Your fingers or toes turn blue or white or are cool, painful, or puffy (swollen).  You have tingling or lose feeling (numbness) around the injured area.  You have bad pain or pressure under the  cast.  You have trouble breathing or have shortness of breath.  You have chest pain. Document Released: 06/12/2010 Document Revised: 10/13/2012 Document Reviewed: 08/19/2012 [facility] Patient Information 2014 [place].

## 2013-07-12 NOTE — ED Provider Notes (Signed)
CSN: [id number]     Arrival date & time 07/12/13  1357 History   First MD Initiated Contact with Patient 07/12/13 1509     Chief Complaint  Patient presents with  . Toe Injury     (Consider location/radiation/quality/duration/timing/severity/associated sxs/prior Treatment) HPI Pt is a 68yo female with hx of IDDM, CAD, CVA, hyperlipidemia, osteoporosis, and HTN presenting to ED c/o left little toe injury. Reports being in her wheelchair when she hit her little toe against the wall around 12PM this afternoon. Pt reports only mild pain at that time due to chronic decreased sensation in her feet from her diabetes and bilateral pedal edema.  States he little toe was turned out to the side and bleeding.  Denies other injuries.  Reports pain is mild, aching, worse with movement.  No pain medication PTA.   Past Medical History  Diagnosis Date  . CAD (coronary artery disease) 2006    s/p CABG (5/06) w/ saphenous vein to RCA at time of AVR  . CVA (cerebral infarction) 2006    Post-op from AVR. Presumed embolic in nature. Carotid stenosis of R 60-79%. Repeat dopplers 4/10 no R stenosis and L stenosis of 1-29%.  . Hyperlipidemia     Mgmt with a statin  . Refusal of blood transfusions as patient is Jehovah's Witness   . Diverticulosis 2001  . Adenocarcinoma of breast 1996    Completed tamoxifen and had mastectomy.  . Osteoporosis 2006    DEXA 10/06 : L femur T -2.8, R -2.7. Lumbar T -2.4. On bisphosphonates and  Calcium / Vit D.  . Hypertension     Requires 4 drug tx  . Diabetes mellitus 1992    Dx 04/25/1990. Now insulin dependent, started 2008. On ACEI.   Marland Kitchen Arthritis   . Depression     Controlled on Paxil  . Aortic stenosis, severe     s/p aortic valve replacement with porcine valve 06/2004.  ECHO 2010 EF 78%, LVH, diastolic dysfxn, Bioprostetic aoritc valve, mild AS. ECHO 2013 EF 60%, Nl aortic artificial valve, dynamic obstruction in the outflow tract   Class IIb rec for annual TTE after 5  yrs. She had a TTE 2013     Past Surgical History  Procedure Laterality Date  . Cholecystectomy    . Mastectomy  1995    L for adenocarcinoma  . Abdominal hysterectomy  1987    for fibroids  . Coronary artery bypass graft  2006    Saphenous vein to RCA at time of AVR. Course complicated by acute respiratory failure, post-op PTX, ARI, ileus, CVA  . Aortic valve replacement  2006   Family History  Problem Relation Age of Onset  . Diabetes Mother   . Hypertension Mother   . Alzheimer's disease Mother   . Heart disease Father 61    AMI at age 59 and 52  . Mental illness Sister   . Heart disease Sister 42    AMI  . Kidney disease Sister    History  Substance Use Topics  . Smoking status: Never Smoker   . Smokeless tobacco: Never Used  . Alcohol Use: Yes     Comment: Occasional beer, monthly   OB History   Grav Para Term Preterm Abortions TAB SAB Ect Mult Living                 Review of Systems  Constitutional: Negative for fever and chills.  Cardiovascular: Positive for leg swelling ( chronic).  Musculoskeletal: Positive  for arthralgias.  Skin: Positive for wound.       Left little toe  All other systems reviewed and are negative.     Allergies  Review of patient's allergies indicates no known allergies.  Home Medications   Prior to Admission medications   Medication Sig Start Date End Date Taking? Authorizing Provider  ACCU-CHEK FASTCLIX LANCETS MISC Check blood sugar 3 times a day dx code 250.02 insulin requiring 04/22/13   [name], MD  alendronate (FOSAMAX) 70 MG tablet Take 1 tablet (70 mg total) by mouth once a week. Take with a full glass of water on an empty stomach. 05/10/13   [name], MD  amLODipine (NORVASC) 10 MG tablet Take 1 tablet (10 mg total) by mouth daily. 05/10/13   [name], MD  aspirin ([employer] ASPIRIN LOW DOSE) 81 MG EC tablet Take 1 tablet (81 mg total) by mouth daily. Swallow whole. 05/10/13   [name], MD  B-D UF III MINI PEN NEEDLES 31G X 5 MM MISC USE 4 TIMES A DAY    [name], MD  gabapentin (NEURONTIN) 300 MG capsule Take 1 capsule (300 mg total) by mouth 3 (three) times daily. 05/10/13   [name], MD  glucose blood [facility]) test strip Check blood sugar 3 times a day dx code 250.02 insulin requiring 04/22/13   [name], MD  insulin aspart (NOVOLOG FLEXPEN) 100 UNIT/ML FlexPen Inject 8 Units into the skin 3 (three) times daily with meals. 05/10/13   [name], MD  Insulin Detemir (LEVEMIR FLEXTOUCH) 100 UNIT/ML Pen Inject 24 Units into the skin at bedtime. 05/10/13   [name], MD  lisinopril (PRINIVIL,ZESTRIL) 40 MG tablet Take 1 tablet (40 mg total) by mouth daily. 05/10/13   [name], MD  loratadine (CLARITIN) 10 MG tablet Take 1 tablet (10 mg total) by mouth daily as needed for allergies. 05/10/13 05/10/14  [name], MD  metFORMIN (GLUCOPHAGE) 1000 MG tablet TAKE 1 TABLET BY MOUTH TWICE A DAY 05/10/13   [name], MD  metoprolol succinate (TOPROL-XL) 25 MG 24 hr tablet Take 1 tablet (25 mg total) by mouth daily. 05/10/13   [name], MD  NOVOLOG FLEXPEN 100 UNIT/ML injection INJECT 8 UNITS THREE TIMES DAILY BEFORE MEALS 03/01/12   [name], MD  PARoxetine (PAXIL) 20 MG tablet TAKE 1 TABLET (20 MG TOTAL) BY MOUTH DAILY.    [name], MD  Sennosides-Docusate Sodium (SENNA) 8.6-50 MG TABS Take 2 tablets by mouth daily as needed. 03/16/12   [name], MD  Simethicone (GAS RELIEF PO) Take 2 tablets by mouth daily as needed. For gas    Historical Provider, MD  simvastatin (ZOCOR) 20 MG tablet Take 1 tablet (20 mg total) by mouth at bedtime. 05/10/13   [name], MD   BP 171/75  Pulse 72  Temp(Src) 98.7 F (37.1 C) (Oral)  Resp 20  SpO2 98% Physical Exam  Nursing note and vitals reviewed. Constitutional: She is oriented to person, place, and time.  She appears well-developed and well-nourished.  HENT:  Head: Normocephalic and atraumatic.  Eyes: EOM are normal.  Neck: Normal range of motion.  Cardiovascular: Normal rate, regular rhythm and normal heart sounds.   Pulmonary/Chest: Effort normal and breath sounds normal. No respiratory distress. She has no wheezes.  Musculoskeletal: Normal range of motion. She exhibits edema ( 2+ pitting edema bilaterally.) and tenderness (  mild deformity of left little toe).  Neurological: She is alert and oriented to person, place, and time.  Skin: Skin is warm and dry.  2cm laceration between web space of left 4th and 5th toe. Active bleeding, controlled with light pressure.  Psychiatric: She has a normal mood and affect. Her behavior is normal.    ED Course  Procedures   LACERATION REPAIR Performed by: Noland Fordyce Authorized by: Noland Fordyce Consent: Verbal consent obtained. Risks and benefits: risks, benefits and alternatives were discussed Consent given by: patient Patient identity confirmed: provided demographic data Prepped and Draped in normal sterile fashion Wound explored  Laceration Location: webspace of 4th and 5th left toes.  Laceration Length: 2cm  No Foreign Bodies seen or palpated  Anesthesia: local infiltration  Local anesthetic: lidocaine 2% without epinephrine  Anesthetic total: 23ml  Irrigation method: syringe Amount of cleaning: standard  Skin closure: 4-0 prolene  Number of sutures: 3  Technique: interrupted   Patient tolerance: Patient tolerated the procedure well with no immediate complications.   Labs Review Labs Reviewed - No data to display  Imaging Review Dg Foot Complete Left  07/12/2013   CLINICAL DATA:  Post traumatic left foot pain centered in the region of the fifth toe  EXAM: LEFT FOOT - COMPLETE 3+ VIEW  COMPARISON:  None.  FINDINGS: The bones of the left foot are diffusely osteopenic. There is an acute angulated fracture of the base of  the proximal phalanx of the fifth toe. The distal phalanx appears intact as does the fifth metatarsal. The first through fourth rays exhibit no acute fractures. The tarsal bones appear intact. There is mild soft tissue swelling over the forefoot. Arterial calcifications are present consistent with known diabetes.  IMPRESSION: The patient has sustained an acute angulated fracture of the proximal aspect of the shaft of the proximal phalanx of the left fifth toe.   Electronically Signed   By: David  Martinique   On: 07/12/2013 16:13     EKG Interpretation None      MDM   Final diagnoses:  Fracture of fifth toe, left, open    Pt with hx of IDDM presenting with open fracture of left fifth toe.  On plain films-acute angulated fracture of proximal aspect of shaft of proximal phalanx of left fifth toe after hitting a wall around 12PM this afternoon. Pt was in her wheelchair at the time.  Pt has chronic bilateral pedal edema as well as decreased sensation due to neuropathy. Discussed pt with Dr. Aline Brochure, will consult orthopedic surgery.    Consulted with Dr. Erlinda Hong, orthopedics who agrees with plan to given ancef in ED, then 2 weeks of keflex. Pt is to call to schedule f/u on Thursday or Friday of this week (5/21 or 5/22).  Dr. Aline Brochure performed digital block for attempted reduction of fracture. Laceration repaired, see procedure note above.  Pt placed in post-op boot. Home care instructions provided.  Return precautions provided. Pt verbalized understanding and agreement with tx plan.       Noland Fordyce, PA-C 07/13/13 [phone]

## 2013-07-12 NOTE — ED Notes (Signed)
Pt reports riding in her wheelchair and hitting her left toes on something, did not have pain but then noticed her left little toe was turned to the side and bleeding. Has moderate swelling noted to bilateral ankles, states that's normal for her but left is > right. Bleeding controlled and bandage applied on arrival.

## 2013-07-12 NOTE — ED Notes (Signed)
Cleaned patient pinky toe with gauze and saline, and placed a piece of absorbent pad between toe to, help stop some of the bleeding.

## 2013-07-13 NOTE — ED Provider Notes (Signed)
Medical screening examination/treatment/procedure(s) were conducted as a shared visit with non-physician practitioner(s) and myself.  I personally evaluated the patient during the encounter.   EKG Interpretation None      I interviewed and examined the patient. Lungs are CTAB. Cardiac exam wnl. Abdomen soft.  Pt has laterally angulated left 5th toe w/ 2cm laceration in the web space.   Good clinical reduction of the toe on exam. Repeat imaging w/ out significant change likely d/t movement of the pt and location of the injury causing the toe to easily dislocate again. Will place in post op shoe, bandage wound, and have pt f/u w/ ortho.   Reduction of dislocation Date/Time: 9:02 PM Performed by: Ladye Macnaughton Ruthe Mannan Authorized by: Blanchard Kelch Consent: Verbal consent obtained. Risks and benefits: risks, benefits and alternatives were discussed Consent given by: patient Required items: required blood products, implants, devices, and special equipment available Time out: Immediately prior to procedure a "time out" was called to verify the correct patient, procedure, equipment, support staff and site/side marked as required.  Digital block performed using lidocaine 1% w/out epi on the left foot, 5th digit by me  Patient tolerance: Patient tolerated the procedure well with no immediate complications. Joint: Proximal phalanx, 5th toe, left foot     Blanchard Kelch, MD 07/13/13 2107

## 2013-07-22 DIAGNOSIS — S92919A Unspecified fracture of unspecified toe(s), initial encounter for closed fracture: Secondary | ICD-10-CM | POA: Diagnosis not present

## 2013-08-09 ENCOUNTER — Encounter: Payer: Self-pay | Admitting: Internal Medicine

## 2013-08-09 ENCOUNTER — Ambulatory Visit: Payer: Medicare Other | Admitting: Internal Medicine

## 2013-11-02 ENCOUNTER — Other Ambulatory Visit: Payer: Self-pay | Admitting: Internal Medicine

## 2013-11-02 NOTE — Telephone Encounter (Signed)
One year supply ordered 3/15.  She needs to make appt with me as overdue for routine F/U.

## 2013-11-03 ENCOUNTER — Encounter: Payer: Self-pay | Admitting: Internal Medicine

## 2013-11-30 ENCOUNTER — Encounter: Payer: Self-pay | Admitting: *Deleted

## 2014-01-05 ENCOUNTER — Ambulatory Visit (INDEPENDENT_AMBULATORY_CARE_PROVIDER_SITE_OTHER): Payer: Medicare Other | Admitting: *Deleted

## 2014-01-05 ENCOUNTER — Encounter: Payer: Self-pay | Admitting: Internal Medicine

## 2014-01-05 ENCOUNTER — Ambulatory Visit (INDEPENDENT_AMBULATORY_CARE_PROVIDER_SITE_OTHER): Payer: Medicare Other | Admitting: Internal Medicine

## 2014-01-05 VITALS — BP 129/62 | HR 70 | Temp 98.4°F | Wt 174.1 lb

## 2014-01-05 DIAGNOSIS — M810 Age-related osteoporosis without current pathological fracture: Secondary | ICD-10-CM | POA: Diagnosis not present

## 2014-01-05 DIAGNOSIS — I2510 Atherosclerotic heart disease of native coronary artery without angina pectoris: Secondary | ICD-10-CM | POA: Diagnosis not present

## 2014-01-05 DIAGNOSIS — E1129 Type 2 diabetes mellitus with other diabetic kidney complication: Secondary | ICD-10-CM

## 2014-01-05 DIAGNOSIS — Z23 Encounter for immunization: Secondary | ICD-10-CM

## 2014-01-05 DIAGNOSIS — E1149 Type 2 diabetes mellitus with other diabetic neurological complication: Secondary | ICD-10-CM

## 2014-01-05 DIAGNOSIS — E785 Hyperlipidemia, unspecified: Secondary | ICD-10-CM

## 2014-01-05 DIAGNOSIS — C50919 Malignant neoplasm of unspecified site of unspecified female breast: Secondary | ICD-10-CM

## 2014-01-05 DIAGNOSIS — Z0000 Encounter for general adult medical examination without abnormal findings: Secondary | ICD-10-CM

## 2014-01-05 DIAGNOSIS — I350 Nonrheumatic aortic (valve) stenosis: Secondary | ICD-10-CM | POA: Diagnosis not present

## 2014-01-05 DIAGNOSIS — E1165 Type 2 diabetes mellitus with hyperglycemia: Secondary | ICD-10-CM | POA: Diagnosis not present

## 2014-01-05 DIAGNOSIS — IMO0002 Reserved for concepts with insufficient information to code with codable children: Secondary | ICD-10-CM

## 2014-01-05 DIAGNOSIS — I10 Essential (primary) hypertension: Secondary | ICD-10-CM

## 2014-01-05 DIAGNOSIS — F329 Major depressive disorder, single episode, unspecified: Secondary | ICD-10-CM

## 2014-01-05 DIAGNOSIS — I639 Cerebral infarction, unspecified: Secondary | ICD-10-CM

## 2014-01-05 DIAGNOSIS — R269 Unspecified abnormalities of gait and mobility: Secondary | ICD-10-CM

## 2014-01-05 DIAGNOSIS — F32A Depression, unspecified: Secondary | ICD-10-CM

## 2014-01-05 LAB — HM DIABETES EYE EXAM

## 2014-01-05 LAB — GLUCOSE, CAPILLARY: Glucose-Capillary: 129 mg/dL — ABNORMAL HIGH (ref 70–99)

## 2014-01-05 LAB — POCT GLYCOSYLATED HEMOGLOBIN (HGB A1C): Hemoglobin A1C: 8.8

## 2014-01-05 MED ORDER — PAROXETINE HCL 20 MG PO TABS: 20.0000 mg | ORAL_TABLET | Freq: Every day | ORAL | Status: DC

## 2014-01-05 MED ORDER — ALENDRONATE SODIUM 70 MG PO TABS: 70.0000 mg | ORAL_TABLET | ORAL | Status: DC

## 2014-01-05 NOTE — Assessment & Plan Note (Signed)
She asked about prosthesis and bras and was provided but is unable to afford copay.

## 2014-01-05 NOTE — Assessment & Plan Note (Signed)
She does have some DOE. Unable to afford ECHO.

## 2014-01-05 NOTE — Assessment & Plan Note (Signed)
She has no angina and remains on her BB, CAD, and statin.

## 2014-01-05 NOTE — Assessment & Plan Note (Signed)
Refused MMG, colon 2/2 cost.

## 2014-01-05 NOTE — Assessment & Plan Note (Signed)
On ASA and statin.

## 2014-01-05 NOTE — Progress Notes (Signed)
   Subjective:    Patient ID: [name], female    DOB: [birth year], 68 y.o.   MRN: [id number]  HPI  Please see the A&P for the status of the pt's chronic medical problems.   Review of Systems  Constitutional: Positive for fatigue. Negative for appetite change and unexpected weight change.  HENT: Positive for sore throat. Negative for rhinorrhea.   Eyes: Negative for itching.  Respiratory: Negative for shortness of breath.   Cardiovascular: Positive for leg swelling. Negative for chest pain.  Gastrointestinal: Positive for diarrhea and constipation.  Genitourinary: Negative for difficulty urinating.  Musculoskeletal: Negative for arthralgias and gait problem.  Skin: Positive for color change.  Neurological: Positive for headaches. Negative for dizziness and light-headedness.  Psychiatric/Behavioral: Negative for sleep disturbance.       Objective:   Physical Exam  Constitutional: She is oriented to person, place, and time. She appears well-developed and well-nourished. No distress.  HENT:  Head: Normocephalic and atraumatic.  Right Ear: External ear normal.  Left Ear: External ear normal.  Nose: Nose normal.  Eyes: Conjunctivae and EOM are normal.  Cardiovascular: Normal rate and regular rhythm.   Murmur heard. Lod 4/6 holosystolic murmur. Nl radial pulses B. B Doppler DP pulses.  Pulmonary/Chest: Effort normal.  Musculoskeletal: Normal range of motion. She exhibits edema.  Trace B  Neurological: She is alert and oriented to person, place, and time.  Skin: Skin is warm and dry. She is not diaphoretic.  B CVI changes. Long toenails. Calluses.   Psychiatric: She has a normal mood and affect. Her behavior is normal. Judgment and thought content normal.          Assessment & Plan:

## 2014-01-05 NOTE — Assessment & Plan Note (Signed)
Remains on statin.

## 2014-01-05 NOTE — Assessment & Plan Note (Signed)
Has Powerchair and uses it when to be up around house quite a bit but unable to get it to store / out of house. Uses walker otherwise.

## 2014-01-05 NOTE — Assessment & Plan Note (Signed)
Well controlled on Paxil.

## 2014-01-05 NOTE — Patient Instructions (Signed)
1. See me in 3 months 2. Let me know when you want any of the following ordered : colon cancer screening, mammogram, eye exam, ECHO (heart study) 3.Check your sugar 2-4 times a day for one week and then come in for an appointment

## 2014-01-05 NOTE — Assessment & Plan Note (Signed)
BP well controlled. BB, ACEi, Amlodipine.

## 2014-01-05 NOTE — Assessment & Plan Note (Signed)
Lab Results  Component Value Date   HGBA1C 8.8 01/05/2014    A1C is trending up. She does not have her meter or log today. She admits that she is not checking her CBG. Denies hypoglycemia. On Levemir 24 QHS ans Novolog 8 with meals. Also on metformin 1000 BID. I encouraged her to check her CBG 2 - 4 times daily and then come in for appt with meter / log so that we may adjust insulin for better control. She got flu shot today but cannot afford eye exam.

## 2014-01-06 ENCOUNTER — Other Ambulatory Visit: Payer: Self-pay | Admitting: Dietician

## 2014-01-06 DIAGNOSIS — E1129 Type 2 diabetes mellitus with other diabetic kidney complication: Secondary | ICD-10-CM

## 2014-01-06 DIAGNOSIS — IMO0002 Reserved for concepts with insufficient information to code with codable children: Secondary | ICD-10-CM

## 2014-01-06 DIAGNOSIS — E1165 Type 2 diabetes mellitus with hyperglycemia: Secondary | ICD-10-CM

## 2014-01-06 MED ORDER — ACCU-CHEK FASTCLIX LANCETS MISC: Status: DC

## 2014-01-06 MED ORDER — GLUCOSE BLOOD VI STRP: ORAL_STRIP | Status: DC

## 2014-01-06 NOTE — Telephone Encounter (Signed)
I could not get through by phone. This is the last meter supply RX in her history, so I am assuming that is the meter she is using at present.

## 2014-01-06 NOTE — Telephone Encounter (Signed)
Thanks Donna!

## 2014-01-10 ENCOUNTER — Encounter: Payer: Self-pay | Admitting: *Deleted

## 2014-04-13 ENCOUNTER — Ambulatory Visit: Payer: Medicare Other | Admitting: Internal Medicine

## 2014-04-26 ENCOUNTER — Other Ambulatory Visit: Payer: Self-pay | Admitting: Internal Medicine

## 2014-04-26 NOTE — Telephone Encounter (Signed)
Yr's supply ordered in [place]

## 2014-05-04 ENCOUNTER — Ambulatory Visit: Payer: Medicare Other | Admitting: Internal Medicine

## 2014-05-04 ENCOUNTER — Encounter: Payer: Self-pay | Admitting: Internal Medicine

## 2014-05-18 ENCOUNTER — Other Ambulatory Visit: Payer: Self-pay | Admitting: *Deleted

## 2014-05-18 DIAGNOSIS — M810 Age-related osteoporosis without current pathological fracture: Secondary | ICD-10-CM

## 2014-05-18 DIAGNOSIS — F32A Depression, unspecified: Secondary | ICD-10-CM

## 2014-05-18 DIAGNOSIS — F329 Major depressive disorder, single episode, unspecified: Secondary | ICD-10-CM

## 2014-05-18 DIAGNOSIS — E1165 Type 2 diabetes mellitus with hyperglycemia: Secondary | ICD-10-CM

## 2014-05-18 DIAGNOSIS — E119 Type 2 diabetes mellitus without complications: Secondary | ICD-10-CM

## 2014-05-18 DIAGNOSIS — IMO0002 Reserved for concepts with insufficient information to code with codable children: Secondary | ICD-10-CM

## 2014-05-18 DIAGNOSIS — E1129 Type 2 diabetes mellitus with other diabetic kidney complication: Secondary | ICD-10-CM

## 2014-05-18 DIAGNOSIS — E785 Hyperlipidemia, unspecified: Secondary | ICD-10-CM

## 2014-05-18 MED ORDER — GLUCOSE BLOOD VI STRP: ORAL_STRIP | Status: DC

## 2014-05-18 MED ORDER — LISINOPRIL 40 MG PO TABS: 40.0000 mg | ORAL_TABLET | Freq: Every day | ORAL | Status: DC

## 2014-05-18 MED ORDER — "PEN NEEDLES 3/16"" 31G X 5 MM MISC": 1.0000 | Freq: Four times a day (QID) | Status: DC

## 2014-05-18 MED ORDER — METOPROLOL SUCCINATE ER 25 MG PO TB24: 25.0000 mg | ORAL_TABLET | Freq: Every day | ORAL | Status: DC

## 2014-05-18 MED ORDER — SIMVASTATIN 20 MG PO TABS: 20.0000 mg | ORAL_TABLET | Freq: Every day | ORAL | Status: DC

## 2014-05-18 MED ORDER — ACCU-CHEK FASTCLIX LANCETS MISC: Status: DC

## 2014-05-18 MED ORDER — AMLODIPINE BESYLATE 10 MG PO TABS: 10.0000 mg | ORAL_TABLET | Freq: Every day | ORAL | Status: DC

## 2014-05-18 MED ORDER — ALENDRONATE SODIUM 70 MG PO TABS: 70.0000 mg | ORAL_TABLET | ORAL | Status: DC

## 2014-05-18 MED ORDER — INSULIN DETEMIR 100 UNIT/ML FLEXPEN: 24.0000 [IU] | PEN_INJECTOR | Freq: Every day | SUBCUTANEOUS | Status: DC

## 2014-05-18 MED ORDER — PAROXETINE HCL 20 MG PO TABS: 20.0000 mg | ORAL_TABLET | Freq: Every day | ORAL | Status: DC

## 2014-05-18 MED ORDER — METFORMIN HCL 1000 MG PO TABS: 1000.0000 mg | ORAL_TABLET | Freq: Two times a day (BID) | ORAL | Status: DC

## 2014-05-18 MED ORDER — ASPIRIN 81 MG PO TBEC: 81.0000 mg | DELAYED_RELEASE_TABLET | Freq: Every day | ORAL | Status: DC

## 2014-05-18 MED ORDER — INSULIN ASPART 100 UNIT/ML FLEXPEN: 8.0000 [IU] | PEN_INJECTOR | Freq: Three times a day (TID) | SUBCUTANEOUS | Status: DC

## 2014-05-18 MED ORDER — GABAPENTIN 300 MG PO CAPS: 300.0000 mg | ORAL_CAPSULE | Freq: Three times a day (TID) | ORAL | Status: DC

## 2014-05-18 NOTE — Telephone Encounter (Signed)
Also needs refill on pen needles.Hilda Blades Susana Gripp RN 05/18/14 3:50PM

## 2014-05-18 NOTE — Telephone Encounter (Signed)
Needs appt PCP ASAP. If I do not have opening this month or in April, let me know and I will come down to see her bc I need to make up a clinic.

## 2014-05-18 NOTE — Telephone Encounter (Signed)
C. Cyndi Bender will call pt with appt as stated Dr Lynnae January.

## 2014-05-26 ENCOUNTER — Other Ambulatory Visit: Payer: Self-pay | Admitting: Internal Medicine

## 2014-05-26 NOTE — Telephone Encounter (Signed)
All of these were ordered 05/18/14.

## 2014-06-01 ENCOUNTER — Encounter: Payer: Medicare Other | Admitting: Internal Medicine

## 2014-06-07 ENCOUNTER — Ambulatory Visit (INDEPENDENT_AMBULATORY_CARE_PROVIDER_SITE_OTHER): Payer: Medicare Other | Admitting: Internal Medicine

## 2014-06-07 ENCOUNTER — Encounter: Payer: Self-pay | Admitting: Internal Medicine

## 2014-06-07 VITALS — BP 140/70 | HR 63 | Temp 98.8°F | Wt 175.2 lb

## 2014-06-07 DIAGNOSIS — Z853 Personal history of malignant neoplasm of breast: Secondary | ICD-10-CM

## 2014-06-07 DIAGNOSIS — Z0000 Encounter for general adult medical examination without abnormal findings: Secondary | ICD-10-CM

## 2014-06-07 DIAGNOSIS — R269 Unspecified abnormalities of gait and mobility: Secondary | ICD-10-CM

## 2014-06-07 DIAGNOSIS — E1165 Type 2 diabetes mellitus with hyperglycemia: Secondary | ICD-10-CM

## 2014-06-07 DIAGNOSIS — Z8673 Personal history of transient ischemic attack (TIA), and cerebral infarction without residual deficits: Secondary | ICD-10-CM | POA: Diagnosis not present

## 2014-06-07 DIAGNOSIS — F329 Major depressive disorder, single episode, unspecified: Secondary | ICD-10-CM

## 2014-06-07 DIAGNOSIS — E785 Hyperlipidemia, unspecified: Secondary | ICD-10-CM | POA: Diagnosis not present

## 2014-06-07 DIAGNOSIS — E1129 Type 2 diabetes mellitus with other diabetic kidney complication: Secondary | ICD-10-CM | POA: Diagnosis not present

## 2014-06-07 DIAGNOSIS — I2510 Atherosclerotic heart disease of native coronary artery without angina pectoris: Secondary | ICD-10-CM

## 2014-06-07 DIAGNOSIS — M810 Age-related osteoporosis without current pathological fracture: Secondary | ICD-10-CM

## 2014-06-07 DIAGNOSIS — Z23 Encounter for immunization: Secondary | ICD-10-CM

## 2014-06-07 DIAGNOSIS — I700 Atherosclerosis of aorta: Secondary | ICD-10-CM | POA: Insufficient documentation

## 2014-06-07 DIAGNOSIS — I421 Obstructive hypertrophic cardiomyopathy: Secondary | ICD-10-CM | POA: Diagnosis not present

## 2014-06-07 DIAGNOSIS — F32A Depression, unspecified: Secondary | ICD-10-CM

## 2014-06-07 DIAGNOSIS — I350 Nonrheumatic aortic (valve) stenosis: Secondary | ICD-10-CM

## 2014-06-07 DIAGNOSIS — IMO0002 Reserved for concepts with insufficient information to code with codable children: Secondary | ICD-10-CM

## 2014-06-07 DIAGNOSIS — I10 Essential (primary) hypertension: Secondary | ICD-10-CM | POA: Diagnosis not present

## 2014-06-07 LAB — BASIC METABOLIC PANEL WITH GFR
BUN: 16 mg/dL (ref 6–23)
CO2: 25 mEq/L (ref 19–32)
Calcium: 9.1 mg/dL (ref 8.4–10.5)
Chloride: 102 mEq/L (ref 96–112)
Creat: 0.92 mg/dL (ref 0.50–1.10)
GFR, Est African American: 73 mL/min
GFR, Est Non African American: 63 mL/min
Glucose, Bld: 180 mg/dL — ABNORMAL HIGH (ref 70–99)
Potassium: 4.3 mEq/L (ref 3.5–5.3)
Sodium: 138 mEq/L (ref 135–145)

## 2014-06-07 LAB — LIPID PANEL
Cholesterol: 130 mg/dL (ref 0–200)
HDL: 32 mg/dL — ABNORMAL LOW (ref >=46)
LDL Cholesterol: 70 mg/dL (ref 0–99)
Total CHOL/HDL Ratio: 4.1 Ratio
Triglycerides: 138 mg/dL (ref <150)
VLDL: 28 mg/dL (ref 0–40)

## 2014-06-07 LAB — GLUCOSE, CAPILLARY: Glucose-Capillary: 189 mg/dL — ABNORMAL HIGH (ref 70–99)

## 2014-06-07 LAB — POCT GLYCOSYLATED HEMOGLOBIN (HGB A1C): Hemoglobin A1C: 8.7

## 2014-06-07 MED ORDER — INSULIN ASPART 100 UNIT/ML FLEXPEN: PEN_INJECTOR | SUBCUTANEOUS | Status: DC

## 2014-06-07 MED ORDER — LORATADINE 10 MG PO CAPS: 10.0000 mg | ORAL_CAPSULE | Freq: Every day | ORAL | Status: DC

## 2014-06-07 NOTE — Assessment & Plan Note (Signed)
Not seeing cards since 2013.

## 2014-06-07 NOTE — Assessment & Plan Note (Signed)
LDL is at goal on Zocor 20. To check LDL today.

## 2014-06-07 NOTE — Assessment & Plan Note (Signed)
Has powerchair at home. When goes to store / MD's appt uses scooter or manual WC. Can stand and walk with walked at home but gets weak quickly. No falls. Daughter asked about strengthening and I offered home PT - pt to think about.

## 2014-06-07 NOTE — Progress Notes (Signed)
   Subjective:    Patient ID: [name], female    DOB: 11/19/1945, 69 y.o.   MRN: [id number]  HPI  [name] is here for routine 3 month F/U for DM, HTN, etc. Please see the A&P for the status of the pt's chronic medical problems.  Review of Systems  Constitutional: Positive for fatigue. Negative for unexpected weight change.  HENT: Positive for rhinorrhea, sinus pressure and sneezing. Negative for sore throat.   Eyes: Positive for discharge.  Respiratory: Positive for shortness of breath.   Cardiovascular: Positive for leg swelling. Negative for chest pain.  Gastrointestinal: Negative for nausea, vomiting, abdominal pain and diarrhea.  Endocrine:       No hypoglycemic sxs.  Musculoskeletal: Positive for back pain and gait problem.  Skin: Negative for wound.  Neurological: Positive for weakness. Negative for headaches.       Global weakness when stands too long  Psychiatric/Behavioral: Negative for sleep disturbance and dysphoric mood.       Objective:   Physical Exam  Constitutional: She is oriented to person, place, and time. She appears well-developed and well-nourished. No distress.  HENT:  Head: Normocephalic and atraumatic.  Right Ear: External ear normal.  Left Ear: External ear normal.  Nose: Nose normal.  Eyes: Conjunctivae and EOM are normal. Right eye exhibits no discharge. Left eye exhibits no discharge. No scleral icterus.  Cardiovascular: Normal rate and regular rhythm.   Murmur heard.  Systolic murmur is present with a grade of 5/6   No diastolic murmur is present  Holosystolic murmur at R sternal border with prominent S2  Pulmonary/Chest: Effort normal and breath sounds normal.  Faint crackles at bases B  Musculoskeletal: She exhibits edema.  +1 LE edema B  Neurological: She is alert and oriented to person, place, and time.  Skin: Skin is warm and dry. She is not diaphoretic.  Psychiatric: She has a normal mood and affect. Her behavior is normal.  Judgment and thought content normal.          Assessment & Plan:

## 2014-06-07 NOTE — Assessment & Plan Note (Signed)
Cont to do well on her paxil.

## 2014-06-07 NOTE — Patient Instructions (Signed)
1. Please increase your supper insulin Novolog dose to 10 units. 2. See me in 3 months 3. Think about the bone test and physical therapy 4. Take calcium 500 mg twice  Day with meals  5. Take Vit D 1000 IU once a day

## 2014-06-07 NOTE — Assessment & Plan Note (Signed)
Lab Results  Component Value Date   HGBA1C 8.7 06/07/2014   A1C unchanged 8.8 to 8.7. Checking CBG couple times weekly. No lows. Meter download shows AM ABG 182 - lunch 166 - afternoon 174 - dinner 137 - bedtime 177. I was hesitant to increase Lantus since lowest recorded value was 92 and pre-dinner was 137. Dinner (evening meal) is largest meal and I increased Novolog pre dinner from 8 to 10 units hoping that it will prevent the post diner spike of 177 and allow for better CBG control starting the day.   Her A1C is not at goal but limited by lack of CBG's. Will make tiny changes every three months. Goal A1C < 8 ish.  Microalb today. PCV 13 today. Lipid today.

## 2014-06-07 NOTE — Assessment & Plan Note (Addendum)
She has global weakness when standing too long but no focal weakness. Cont ASA and statin. Cont meds.

## 2014-06-07 NOTE — Assessment & Plan Note (Signed)
Seasonal allergies today. Claritin 10 mg (pt choice over nasal steroid).  PCV 13 today. Microal, FLP, BMP.  Pt to think about PT and DEXA.

## 2014-06-07 NOTE — Assessment & Plan Note (Signed)
Noted on CT in 2006 and we are working on RF modification.

## 2014-06-07 NOTE — Assessment & Plan Note (Signed)
Last DEXA was in 2006 with T scores of -2.8 and 2.4. She also has thoracic compression fx that occurred sometime btw 2007 and 2012. Has been on alendronate since before 2012. Currently not taking calcium and Vit D so I am rec those. Checking VIt D level today. I rec repeat DEXA but she is hesitant mostly 2/2 cost. It would be nice to know new BMD as at point where alendronate needs to be stopped.

## 2014-06-07 NOTE — Assessment & Plan Note (Signed)
Not able to afford prosthesis.

## 2014-06-07 NOTE — Assessment & Plan Note (Signed)
She is on all appropriate tx - ASA, Toprol 25 and Zocor 20. She is not active but is CP free.

## 2014-06-07 NOTE — Assessment & Plan Note (Signed)
BP Readings from Last 3 Encounters:  06/07/14 140/70  01/05/14 129/62  07/12/13 171/75    Great control on Toprol 25, Amlodipine 10, and lisinopril 40. BMP today.

## 2014-06-07 NOTE — Assessment & Plan Note (Addendum)
Murmur is prominent. + DOE and pt is not very active. Pt has declined ECHO every prior appt 2/2 cost and I did not offer ECHO at this appt. Last ECHO showed a nl artifical valve.

## 2014-06-08 ENCOUNTER — Encounter: Payer: Self-pay | Admitting: Internal Medicine

## 2014-06-08 LAB — MICROALBUMIN / CREATININE URINE RATIO
Creatinine, Urine: 66.6 mg/dL
Microalb Creat Ratio: 764.3 mg/g — ABNORMAL HIGH (ref 0.0–30.0)
Microalb, Ur: 50.9 mg/dL — ABNORMAL HIGH (ref <2.0)

## 2014-06-08 LAB — VITAMIN D 25 HYDROXY (VIT D DEFICIENCY, FRACTURES): Vit D, 25-Hydroxy: 9 ng/mL — ABNORMAL LOW (ref 30–100)

## 2014-06-17 ENCOUNTER — Other Ambulatory Visit: Payer: Self-pay | Admitting: Internal Medicine

## 2014-06-19 NOTE — Telephone Encounter (Signed)
I refilled this for a yr earlier this month

## 2015-05-23 ENCOUNTER — Telehealth: Payer: Self-pay | Admitting: Internal Medicine

## 2015-05-23 NOTE — Telephone Encounter (Signed)
APPT. REMINDER CALL, NO ANSWER, NO VOICE MAIL °

## 2015-05-25 ENCOUNTER — Encounter: Payer: Self-pay | Admitting: Internal Medicine

## 2015-05-25 ENCOUNTER — Ambulatory Visit (INDEPENDENT_AMBULATORY_CARE_PROVIDER_SITE_OTHER): Payer: Medicare Other | Admitting: Internal Medicine

## 2015-05-25 VITALS — BP 138/66 | HR 98 | Temp 98.0°F | Wt 176.4 lb

## 2015-05-25 DIAGNOSIS — F329 Major depressive disorder, single episode, unspecified: Secondary | ICD-10-CM

## 2015-05-25 DIAGNOSIS — E1129 Type 2 diabetes mellitus with other diabetic kidney complication: Secondary | ICD-10-CM

## 2015-05-25 DIAGNOSIS — I10 Essential (primary) hypertension: Secondary | ICD-10-CM | POA: Diagnosis not present

## 2015-05-25 DIAGNOSIS — E785 Hyperlipidemia, unspecified: Secondary | ICD-10-CM

## 2015-05-25 DIAGNOSIS — E1122 Type 2 diabetes mellitus with diabetic chronic kidney disease: Secondary | ICD-10-CM | POA: Diagnosis not present

## 2015-05-25 DIAGNOSIS — N182 Chronic kidney disease, stage 2 (mild): Secondary | ICD-10-CM | POA: Diagnosis not present

## 2015-05-25 DIAGNOSIS — N289 Disorder of kidney and ureter, unspecified: Secondary | ICD-10-CM | POA: Diagnosis not present

## 2015-05-25 DIAGNOSIS — Z0000 Encounter for general adult medical examination without abnormal findings: Secondary | ICD-10-CM

## 2015-05-25 DIAGNOSIS — Z794 Long term (current) use of insulin: Secondary | ICD-10-CM

## 2015-05-25 DIAGNOSIS — E1165 Type 2 diabetes mellitus with hyperglycemia: Secondary | ICD-10-CM

## 2015-05-25 DIAGNOSIS — Z79899 Other long term (current) drug therapy: Secondary | ICD-10-CM

## 2015-05-25 DIAGNOSIS — M810 Age-related osteoporosis without current pathological fracture: Secondary | ICD-10-CM | POA: Diagnosis not present

## 2015-05-25 DIAGNOSIS — F32A Depression, unspecified: Secondary | ICD-10-CM

## 2015-05-25 DIAGNOSIS — IMO0002 Reserved for concepts with insufficient information to code with codable children: Secondary | ICD-10-CM

## 2015-05-25 LAB — GLUCOSE, CAPILLARY: Glucose-Capillary: 110 mg/dL — ABNORMAL HIGH (ref 65–99)

## 2015-05-25 LAB — POCT GLYCOSYLATED HEMOGLOBIN (HGB A1C): Hemoglobin A1C: 7.2

## 2015-05-25 MED ORDER — AMLODIPINE BESYLATE 10 MG PO TABS: 10.0000 mg | ORAL_TABLET | Freq: Every day | ORAL | Status: DC

## 2015-05-25 MED ORDER — INSULIN DETEMIR 100 UNIT/ML FLEXPEN: 24.0000 [IU] | PEN_INJECTOR | Freq: Every day | SUBCUTANEOUS | Status: DC

## 2015-05-25 MED ORDER — INSULIN ASPART 100 UNIT/ML FLEXPEN: PEN_INJECTOR | SUBCUTANEOUS | Status: DC

## 2015-05-25 MED ORDER — LISINOPRIL 40 MG PO TABS: 40.0000 mg | ORAL_TABLET | Freq: Every day | ORAL | Status: DC

## 2015-05-25 MED ORDER — SIMVASTATIN 20 MG PO TABS: 20.0000 mg | ORAL_TABLET | Freq: Every day | ORAL | Status: DC

## 2015-05-25 MED ORDER — "PEN NEEDLES 3/16"" 31G X 5 MM MISC": 1.0000 | Freq: Four times a day (QID) | Status: DC

## 2015-05-25 MED ORDER — PAROXETINE HCL 20 MG PO TABS: 20.0000 mg | ORAL_TABLET | Freq: Every day | ORAL | Status: DC

## 2015-05-25 MED ORDER — METOPROLOL SUCCINATE ER 25 MG PO TB24: 25.0000 mg | ORAL_TABLET | Freq: Every day | ORAL | Status: DC

## 2015-05-25 MED ORDER — METFORMIN HCL 1000 MG PO TABS: 1000.0000 mg | ORAL_TABLET | Freq: Two times a day (BID) | ORAL | Status: DC

## 2015-05-25 MED ORDER — GLUCOSE BLOOD VI STRP: ORAL_STRIP | Status: DC

## 2015-05-25 MED ORDER — GABAPENTIN 300 MG PO CAPS: 300.0000 mg | ORAL_CAPSULE | Freq: Three times a day (TID) | ORAL | Status: DC

## 2015-05-25 MED ORDER — ACCU-CHEK FASTCLIX LANCETS MISC: Status: DC

## 2015-05-25 MED ORDER — ALENDRONATE SODIUM 70 MG PO TABS: 70.0000 mg | ORAL_TABLET | ORAL | Status: DC

## 2015-05-25 NOTE — Assessment & Plan Note (Signed)
DM II - was last seen on 05/2014. hgba1c was 8.8 to 8.7 last time. On metformin $RemoveBefo'1000mg'AEECEAptndq$  bid, novolog, novolog 12/03/08, laevemir 24 u qHS, didn't bring meter. Does not want foot exam. Discussed her case with Barry Brunner. [name] is concerned that she is not taking her novolog regularly not also not checking her sugar. Patient states compliance with her novolog but states not checking her sugar regularly. Denies any hypoglycemic events. States when she checks, her sugars are in 130-150's. No polyuria, numbness, tingling or other problems. Today hgba1c is 7.2.   Initially thought about switching her novolog to victoza to make her comply more as there was concern for noncompliance with novolog. However, patient states she is ok with taking the novolog and given the fact that her hgba1c is 7.2 today I will continue the same regimen as it's working for her.  Asked her to f/up in 1 month with meter with her PCP. Further adjustment can be made that visit.

## 2015-05-25 NOTE — Patient Instructions (Addendum)
You are doing great.  Continue you other medications.  Check your sugar 3 times a day and bring your meter with you.   Follow up with your PCP Dr. Lynnae January in 1 month.

## 2015-05-25 NOTE — Assessment & Plan Note (Signed)
Filed Vitals:   05/25/15 1054  BP: 138/66  Pulse: 98  Temp: 98 F (36.7 C)   Doing well on amlodipine $RemoveBefor'10mg'TcBdMnodZthG$  daily, metop $RemoveBefo'25mg'zNiXbOWuEtg$  daily, and lisinopril $RemoveBefore'40mg'ppTmstRwRMjxL$  daily. Avoiding diuretics 2/2 to HOCM. -refilled script.

## 2015-05-25 NOTE — Progress Notes (Signed)
   Subjective:    Patient ID: [name], female    DOB: [birth year], 70 y.o.   MRN: [id number]  HPI  70 yo female with AS s/p AVR, athescleoris of aorta, osteoporesis, CAD, HOCM, HTN, DM II uncontrolled, depression here for DM II follow up.  DM II - was last seen on 05/2014. hgba1c was 8.8 to 8.7 last time. On metformin $RemoveBefo'1000mg'FiajzhxBORq$  bid, novolog, novolog 12/03/08, laevemir 24 u qHS, didn't bring meter. Does not want foot exam. Discussed her case with [name]. [name] is concerned that she is not taking her novolog regularly not also not checking her sugar. Patient states compliance with her novolog but states not checking her sugar regularly. Denies any hypoglycemic events. States when she checks, her sugars are in 130-150's. No polyuria, numbness, tingling or other problems. Today hgba1c is 7.2.   HTN: on amlodipine $RemoveBefor'10mg'UbfKelxQwlaa$  daily + metop $Remov'25mg'nkfLpN$  daily + lisinopril $RemoveBefo'40mg'OTDbwDZkvYe$  daily. Well controlled on this.     Review of Systems  Constitutional: Negative for fever, chills and fatigue.  HENT: Negative for congestion and sore throat.   Eyes: Negative for photophobia and visual disturbance.  Respiratory: Negative for chest tightness and shortness of breath.   Cardiovascular: Negative for chest pain, palpitations and leg swelling.  Gastrointestinal: Negative for abdominal pain and abdominal distention.  Genitourinary: Negative for dysuria and flank pain.  Musculoskeletal: Negative for back pain and arthralgias.  Neurological: Negative for dizziness, numbness and headaches.       Objective:   Physical Exam  Constitutional: She is oriented to person, place, and time. She appears well-developed and well-nourished. No distress.  HENT:  Head: Normocephalic and atraumatic.  Eyes: Conjunctivae are normal. Pupils are equal, round, and reactive to light.  Neck: Normal range of motion.  Cardiovascular: Normal rate and regular rhythm.  Exam reveals no gallop and no friction rub.   Systolic murmur    Pulmonary/Chest: Effort normal and breath sounds normal. No respiratory distress. She has no wheezes.  Abdominal: Soft. Bowel sounds are normal. She exhibits no distension. There is no tenderness.  Musculoskeletal: Normal range of motion. She exhibits no edema.  Neurological: She is alert and oriented to person, place, and time.  Skin: Skin is warm. She is not diaphoretic.  Psychiatric: She has a normal mood and affect.    Filed Vitals:   05/25/15 1054  BP: 138/66  Pulse: 98  Temp: 98 F (36.7 C)         Assessment & Plan:  See problem based a&p.

## 2015-05-25 NOTE — Assessment & Plan Note (Addendum)
On alendronate since 2012. Last dexa 2006, hesitated repeating last visit with PCP due to cost.  -refilled alendronate.  -ordered dexa.

## 2015-05-25 NOTE — Assessment & Plan Note (Signed)
On zocor $Remove'20mg'bgLfApN$  daily. Will repeat lipid panel. May benefit from moderate intensity with her DM II. Will leave upto PCP.

## 2015-05-25 NOTE — Assessment & Plan Note (Signed)
Well controlled on paxil. Will cont this for now. Refilled.

## 2015-05-25 NOTE — Assessment & Plan Note (Signed)
Gave her flu shot today. Ordered mammogram.

## 2015-05-26 LAB — BASIC METABOLIC PANEL
BUN/Creatinine Ratio: 25 (ref 11–26)
BUN: 21 mg/dL (ref 8–27)
CO2: 23 mmol/L (ref 18–29)
Calcium: 9.4 mg/dL (ref 8.7–10.3)
Chloride: 101 mmol/L (ref 96–106)
Creatinine, Ser: 0.85 mg/dL (ref 0.57–1.00)
GFR calc Af Amer: 80 mL/min/{1.73_m2} (ref >59)
GFR calc non Af Amer: 69 mL/min/{1.73_m2} (ref >59)
Glucose: 112 mg/dL — ABNORMAL HIGH (ref 65–99)
Potassium: 4.4 mmol/L (ref 3.5–5.2)
Sodium: 142 mmol/L (ref 134–144)

## 2015-05-26 LAB — LIPID PANEL
Chol/HDL Ratio: 3.6 ratio units (ref 0.0–4.4)
Cholesterol, Total: 148 mg/dL (ref 100–199)
HDL: 41 mg/dL (ref >39)
LDL Calculated: 79 mg/dL (ref 0–99)
Triglycerides: 140 mg/dL (ref 0–149)
VLDL Cholesterol Cal: 28 mg/dL (ref 5–40)

## 2015-05-28 ENCOUNTER — Other Ambulatory Visit: Payer: Self-pay | Admitting: Internal Medicine

## 2015-05-28 NOTE — Telephone Encounter (Signed)
One yrs supply sent 3/31 to [facility]

## 2015-05-29 NOTE — Progress Notes (Signed)
Internal Medicine Clinic Attending  Case discussed with Dr. Ahmed soon after the resident saw the patient.  We reviewed the resident's history and exam and pertinent patient test results.  I agree with the assessment, diagnosis, and plan of care documented in the resident's note. 

## 2015-06-14 ENCOUNTER — Other Ambulatory Visit: Payer: Self-pay | Admitting: Internal Medicine

## 2015-06-21 ENCOUNTER — Telehealth: Payer: Self-pay | Admitting: Internal Medicine

## 2015-06-21 NOTE — Telephone Encounter (Signed)
APPT. REMINDER CALL, LMTCB °

## 2015-06-22 ENCOUNTER — Encounter: Payer: Self-pay | Admitting: Internal Medicine

## 2015-06-22 ENCOUNTER — Ambulatory Visit (INDEPENDENT_AMBULATORY_CARE_PROVIDER_SITE_OTHER): Payer: Medicare Other | Admitting: Internal Medicine

## 2015-06-22 ENCOUNTER — Ambulatory Visit: Payer: Medicare Other | Admitting: Internal Medicine

## 2015-06-22 VITALS — BP 168/81 | HR 65 | Temp 98.0°F | Ht 67.0 in | Wt 170.5 lb

## 2015-06-22 DIAGNOSIS — I10 Essential (primary) hypertension: Secondary | ICD-10-CM | POA: Diagnosis not present

## 2015-06-22 DIAGNOSIS — N289 Disorder of kidney and ureter, unspecified: Secondary | ICD-10-CM | POA: Diagnosis not present

## 2015-06-22 DIAGNOSIS — E1122 Type 2 diabetes mellitus with diabetic chronic kidney disease: Secondary | ICD-10-CM

## 2015-06-22 DIAGNOSIS — IMO0002 Reserved for concepts with insufficient information to code with codable children: Secondary | ICD-10-CM

## 2015-06-22 DIAGNOSIS — E1129 Type 2 diabetes mellitus with other diabetic kidney complication: Secondary | ICD-10-CM

## 2015-06-22 DIAGNOSIS — Z79899 Other long term (current) drug therapy: Secondary | ICD-10-CM | POA: Diagnosis not present

## 2015-06-22 DIAGNOSIS — N182 Chronic kidney disease, stage 2 (mild): Principal | ICD-10-CM

## 2015-06-22 DIAGNOSIS — Z794 Long term (current) use of insulin: Secondary | ICD-10-CM | POA: Diagnosis not present

## 2015-06-22 DIAGNOSIS — E1165 Type 2 diabetes mellitus with hyperglycemia: Secondary | ICD-10-CM

## 2015-06-22 LAB — GLUCOSE, CAPILLARY: Glucose-Capillary: 122 mg/dL — ABNORMAL HIGH (ref 65–99)

## 2015-06-22 NOTE — Addendum Note (Signed)
Addended by: Norman Herrlich on: 06/22/2015 11:06 AM   Modules accepted: Orders

## 2015-06-22 NOTE — Assessment & Plan Note (Addendum)
Pt here f/u DM f/u, last seen on 3/31 when her A1c was 7.2. She is able to self recall her DM meds- metformin $RemoveBeforeD'1000mg'mJEQglLQAYgJJn$  BID, novolog 10 units w/ meals, and levemir 24 units qhs. She did not bring in her glucometer but reports lowest reading of 77 and highest of 200. She did feel light headed when her CBG was 57 but was able to eat and when rechecked her CBG went up.   - foot exam done today, podiatry referral made.  - continue current med regimen - fu in 2 months for a1c.
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
## 2015-06-22 NOTE — Assessment & Plan Note (Signed)
BP Readings from Last 3 Encounters:  06/22/15 168/81  05/25/15 138/66  06/07/14 140/70    Lab Results  Component Value Date   NA 142 05/25/2015   K 4.4 05/25/2015   CREATININE 0.85 05/25/2015    Assessment: Blood pressure control:  not controlled Progress toward BP goal:   not at goal Comments: pt did not take her morning BP meds b/c she did not want to have her stomach act up when she was out of the house for her clinic visit today. Previously nl BP last visit. She was also noted to be a high falls risk by her PCP.   Plan: Medications:  continue current medications Educational resources provided: brochure (denies need) Self management tools provided:   Other plans: f/u in 2 months.

## 2015-06-25 ENCOUNTER — Ambulatory Visit: Payer: Medicare Other | Admitting: Internal Medicine

## 2015-06-25 NOTE — Progress Notes (Signed)
Internal Medicine Clinic Attending  Case discussed with Dr. Truong at the time of the visit.  We reviewed the resident's history and exam and pertinent patient test results.  I agree with the assessment, diagnosis, and plan of care documented in the resident's note.  

## 2015-07-05 ENCOUNTER — Other Ambulatory Visit: Payer: Self-pay | Admitting: Internal Medicine

## 2015-07-17 ENCOUNTER — Encounter: Payer: Self-pay | Admitting: *Deleted

## 2015-08-01 ENCOUNTER — Other Ambulatory Visit: Payer: Self-pay | Admitting: Internal Medicine

## 2015-08-16 ENCOUNTER — Ambulatory Visit: Payer: Medicare Other | Admitting: Internal Medicine

## 2015-08-16 ENCOUNTER — Encounter: Payer: Self-pay | Admitting: Internal Medicine

## 2015-08-20 NOTE — Addendum Note (Signed)
Addended by: Hulan Fray on: 08/20/2015 06:49 PM   Modules accepted: Orders

## 2015-08-24 ENCOUNTER — Other Ambulatory Visit: Payer: Self-pay | Admitting: Internal Medicine

## 2015-08-24 DIAGNOSIS — Z9012 Acquired absence of left breast and nipple: Secondary | ICD-10-CM

## 2015-08-24 DIAGNOSIS — Z1231 Encounter for screening mammogram for malignant neoplasm of breast: Secondary | ICD-10-CM

## 2015-09-11 ENCOUNTER — Ambulatory Visit
Admission: RE | Admit: 2015-09-11 | Discharge: 2015-09-11 | Disposition: A | Discharge status: Home / Self Care | Payer: Medicare Other | Source: Ambulatory Visit | Attending: Internal Medicine | Admitting: Internal Medicine

## 2015-09-11 DIAGNOSIS — Z780 Asymptomatic menopausal state: Secondary | ICD-10-CM | POA: Diagnosis not present

## 2015-09-11 DIAGNOSIS — M810 Age-related osteoporosis without current pathological fracture: Secondary | ICD-10-CM

## 2015-09-11 DIAGNOSIS — Z1231 Encounter for screening mammogram for malignant neoplasm of breast: Secondary | ICD-10-CM | POA: Diagnosis not present

## 2015-09-11 DIAGNOSIS — Z9012 Acquired absence of left breast and nipple: Secondary | ICD-10-CM

## 2015-10-10 ENCOUNTER — Other Ambulatory Visit: Payer: Self-pay | Admitting: Internal Medicine

## 2015-10-10 DIAGNOSIS — I10 Essential (primary) hypertension: Secondary | ICD-10-CM

## 2015-10-10 NOTE — Telephone Encounter (Signed)
Patient needs an appointment will send to scheduling.

## 2015-10-11 NOTE — Telephone Encounter (Signed)
Needs appt with me next 90 days HTN F/U

## 2015-11-08 ENCOUNTER — Ambulatory Visit (INDEPENDENT_AMBULATORY_CARE_PROVIDER_SITE_OTHER): Payer: Medicare Other | Admitting: Internal Medicine

## 2015-11-08 ENCOUNTER — Encounter: Payer: Self-pay | Admitting: Dietician

## 2015-11-08 VITALS — BP 159/78 | HR 81 | Temp 98.6°F | Wt 182.5 lb

## 2015-11-08 DIAGNOSIS — Z7982 Long term (current) use of aspirin: Secondary | ICD-10-CM | POA: Diagnosis not present

## 2015-11-08 DIAGNOSIS — I10 Essential (primary) hypertension: Secondary | ICD-10-CM

## 2015-11-08 DIAGNOSIS — Z23 Encounter for immunization: Secondary | ICD-10-CM | POA: Diagnosis not present

## 2015-11-08 DIAGNOSIS — R600 Localized edema: Secondary | ICD-10-CM | POA: Insufficient documentation

## 2015-11-08 DIAGNOSIS — F329 Major depressive disorder, single episode, unspecified: Secondary | ICD-10-CM

## 2015-11-08 DIAGNOSIS — I700 Atherosclerosis of aorta: Secondary | ICD-10-CM

## 2015-11-08 DIAGNOSIS — I129 Hypertensive chronic kidney disease with stage 1 through stage 4 chronic kidney disease, or unspecified chronic kidney disease: Secondary | ICD-10-CM

## 2015-11-08 DIAGNOSIS — Z794 Long term (current) use of insulin: Secondary | ICD-10-CM

## 2015-11-08 DIAGNOSIS — Z952 Presence of prosthetic heart valve: Secondary | ICD-10-CM

## 2015-11-08 DIAGNOSIS — Z79899 Other long term (current) drug therapy: Secondary | ICD-10-CM

## 2015-11-08 DIAGNOSIS — Z0000 Encounter for general adult medical examination without abnormal findings: Secondary | ICD-10-CM

## 2015-11-08 DIAGNOSIS — F32A Depression, unspecified: Secondary | ICD-10-CM

## 2015-11-08 DIAGNOSIS — E1140 Type 2 diabetes mellitus with diabetic neuropathy, unspecified: Secondary | ICD-10-CM

## 2015-11-08 DIAGNOSIS — M810 Age-related osteoporosis without current pathological fracture: Secondary | ICD-10-CM | POA: Diagnosis not present

## 2015-11-08 DIAGNOSIS — E785 Hyperlipidemia, unspecified: Secondary | ICD-10-CM | POA: Diagnosis not present

## 2015-11-08 DIAGNOSIS — R011 Cardiac murmur, unspecified: Secondary | ICD-10-CM

## 2015-11-08 DIAGNOSIS — Z8673 Personal history of transient ischemic attack (TIA), and cerebral infarction without residual deficits: Secondary | ICD-10-CM

## 2015-11-08 DIAGNOSIS — I350 Nonrheumatic aortic (valve) stenosis: Secondary | ICD-10-CM

## 2015-11-08 DIAGNOSIS — IMO0002 Reserved for concepts with insufficient information to code with codable children: Secondary | ICD-10-CM

## 2015-11-08 DIAGNOSIS — E1122 Type 2 diabetes mellitus with diabetic chronic kidney disease: Secondary | ICD-10-CM | POA: Diagnosis not present

## 2015-11-08 DIAGNOSIS — N182 Chronic kidney disease, stage 2 (mild): Secondary | ICD-10-CM | POA: Diagnosis not present

## 2015-11-08 DIAGNOSIS — E1165 Type 2 diabetes mellitus with hyperglycemia: Secondary | ICD-10-CM

## 2015-11-08 DIAGNOSIS — I2510 Atherosclerotic heart disease of native coronary artery without angina pectoris: Secondary | ICD-10-CM

## 2015-11-08 LAB — BRAIN NATRIURETIC PEPTIDE: B Natriuretic Peptide: 107.5 pg/mL — ABNORMAL HIGH (ref 0.0–100.0)

## 2015-11-08 LAB — GLUCOSE, CAPILLARY: Glucose-Capillary: 178 mg/dL — ABNORMAL HIGH (ref 65–99)

## 2015-11-08 LAB — HM DIABETES EYE EXAM

## 2015-11-08 LAB — POCT GLYCOSYLATED HEMOGLOBIN (HGB A1C): Hemoglobin A1C: 7.9

## 2015-11-08 MED ORDER — VITAMIN D (ERGOCALCIFEROL) 1.25 MG (50000 UNIT) PO CAPS: 50000.0000 [IU] | ORAL_CAPSULE | ORAL | 0 refills | Status: DC

## 2015-11-08 NOTE — Assessment & Plan Note (Signed)
Has been on alendronate since about 2012. Not taking calcium or Vit D (level was 9 in 2016). I Rx Vit D 50,000 weekly for 6 weeks. Encourage OTC calcium.  PLAN : alendronate, Vit D, calcium

## 2015-11-08 NOTE — Assessment & Plan Note (Addendum)
Flu today Microalb today Retinal camera today Doubt colon cancer screening beneficial based on co-morbidities.  She had a strong, nl L DP doppler pulse but pulse by doppler weak and not triphasic. No claudication but not very active. No foot sores. No critical limb ischemia so wonder the benefit of ABI's. For now, ECHO is more pressing so will focus on it.

## 2015-11-08 NOTE — Progress Notes (Signed)
   Subjective:    Patient ID: [name], female    DOB: 07-Jan-1946, 70 y.o.   MRN: [id number]  HPI  [name] is here for LE edema. Please see the A&P for the status of the pt's chronic medical problems.  ROS : per ROS section and in problem oriented charting. All other systems are negative.  PMHx, Soc hx, and / or Fam hx : Had gotten Theatre manager when lived in one story larger home. Now in two story apartment with daughter and granddaughter. She struggles to get up the stairs. Mostly stays upstairs in her bedroom in new place. They are looking for new place. She has walker to use in home. Used manual chair to get to [facility].  Review of Systems  Constitutional: Positive for activity change. Negative for unexpected weight change.  Eyes: Positive for visual disturbance.  Respiratory: Negative for cough and shortness of breath.   Cardiovascular: Positive for leg swelling. Negative for chest pain.  Gastrointestinal: Negative for abdominal pain and constipation.  Genitourinary: Negative for difficulty urinating.       + stress and urge incontinence. requires adult underwear daily.  Skin: Positive for color change and wound.  Neurological: Negative for weakness and light-headedness.  Psychiatric/Behavioral: Negative for sleep disturbance.       Objective:   Physical Exam  Constitutional: She appears well-developed and well-nourished. No distress.  HENT:  Head: Normocephalic and atraumatic.  Right Ear: External ear normal.  Left Ear: External ear normal.  Nose: Nose normal.  Eyes: Conjunctivae and EOM are normal. Right eye exhibits no discharge. Left eye exhibits no discharge. No scleral icterus.  Neck: Normal range of motion. Neck supple. No thyromegaly present.  Cardiovascular: Normal rate, regular rhythm and normal heart sounds.   R sternal border, systolic blowing murmur, holosystolic but louder at end.  Pulmonary/Chest: Effort normal and breath sounds normal. No respiratory  distress. She has no wheezes. She has no rales.  Musculoskeletal: Normal range of motion.  + 2 pitting edema B to knees  Lymphadenopathy:    She has no cervical adenopathy.  Skin: Skin is warm and dry. She is not diaphoretic.  Lichenification of top feet B and l lower medial lower leg. Thickened skin. Abrasion L lower leg medial side.  Psychiatric: She has a normal mood and affect. Her behavior is normal. Judgment and thought content normal.          Assessment & Plan:

## 2015-11-08 NOTE — Assessment & Plan Note (Signed)
Sxs well controlled on Paxil. No SE.  PLAN:  Cont current meds

## 2015-11-08 NOTE — Assessment & Plan Note (Addendum)
A1C today 7.9 up from 7.2. On metformin 1000 BID, levemir 24 units, and novolog 10 TID. No CBG at home 2/2 meter broke for some time. Never feels lows. No SE to meds.   Mod controlled DM. Would prefer better control and home CBG monitoring but overall, A1C acceptable.  PLAN : cont meds Retinal camera Flu shot microalb   Has sig diabetic nephropathy. On ARB, norvasc, and BB. Would consider changing amlodipine to verap or dilt to help with albuminuria. Risk would be hypotension / bradycardia.. Stopping norvasc might also help LE edema.

## 2015-11-08 NOTE — Patient Instructions (Signed)
1. We are checking your eyes today 2. Come back after you get the ECHO (heart picture) 3. We might consider trying a water pill 4. I will mail you your test results 5. You got th flu shot today 6. Try the Vitamin D pill once a week for 6 weeks 7. Get OTC calcium and take to strengthen your bones

## 2015-11-08 NOTE — Progress Notes (Signed)
New blood sugar meter sample provided to patient at her request. She verbalized understanding about how to use it and requests a prescription for the supplies be  Sent to her pharmacy.

## 2015-11-08 NOTE — Assessment & Plan Note (Addendum)
BP Readings from Last 3 Encounters:  11/08/15 (!) 159/78  06/22/15 (!) 168/81  05/25/15 138/66   On Toprol 25, norvasc 10, and lisinopril 40. No home BP monitoring. Might need to add on diuretic next appt (LE edema and mod BP control) but has h/o HOCM - will need ECHO results and other test results.   PLAN:  Cont current meds F/U 2-4 weeks

## 2015-11-08 NOTE — Assessment & Plan Note (Signed)
She is agreeable to an ECHO today. I am concerned bc murmur peaks late which might indicate severe AS. And with LE edema today although lungs are clear which might indicate failing valve. But she denies angina, syncope, or dyspnea. 2013 ECHO showed normal functioning heart valve.  PLAN : TTE

## 2015-11-08 NOTE — Assessment & Plan Note (Signed)
Ambulation info in social hx above. On statin and ASA. No focal weakness and denies global weakness today,  PLAN:  Cont current meds

## 2015-11-08 NOTE — Assessment & Plan Note (Signed)
No CP although not too active. On asa, statin, and BB.   PLAN:  Cont current meds

## 2015-11-08 NOTE — Assessment & Plan Note (Signed)
Cont RF mgmt with HTN, DM, HLD control.   PLAN:  Cont current meds

## 2015-11-08 NOTE — Assessment & Plan Note (Signed)
Has been stable. On ACEI  PLAN:  BMP today

## 2015-11-08 NOTE — Assessment & Plan Note (Signed)
On zocor 20. LDL 79 in March 2017. Secondary prevention. Will need to discuss high intensity statin next appt.  PLAN:  Cont current meds

## 2015-11-09 ENCOUNTER — Encounter: Payer: Self-pay | Admitting: Internal Medicine

## 2015-11-09 LAB — CMP14 + ANION GAP
ALT: 7 IU/L (ref 0–32)
AST: 12 IU/L (ref 0–40)
Albumin/Globulin Ratio: 1.3 (ref 1.2–2.2)
Albumin: 4.2 g/dL (ref 3.5–4.8)
Alkaline Phosphatase: 111 IU/L (ref 39–117)
Anion Gap: 16 mmol/L (ref 10.0–18.0)
BUN/Creatinine Ratio: 14 (ref 12–28)
BUN: 16 mg/dL (ref 8–27)
Bilirubin Total: 0.3 mg/dL (ref 0.0–1.2)
CO2: 25 mmol/L (ref 18–29)
Calcium: 9.3 mg/dL (ref 8.7–10.3)
Chloride: 99 mmol/L (ref 96–106)
Creatinine, Ser: 1.18 mg/dL — ABNORMAL HIGH (ref 0.57–1.00)
GFR calc Af Amer: 53 mL/min/{1.73_m2} — ABNORMAL LOW (ref >59)
GFR calc non Af Amer: 46 mL/min/{1.73_m2} — ABNORMAL LOW (ref >59)
Globulin, Total: 3.2 g/dL (ref 1.5–4.5)
Glucose: 195 mg/dL — ABNORMAL HIGH (ref 65–99)
Potassium: 4.6 mmol/L (ref 3.5–5.2)
Sodium: 140 mmol/L (ref 134–144)
Total Protein: 7.4 g/dL (ref 6.0–8.5)

## 2015-11-09 LAB — TSH: TSH: 1.98 u[IU]/mL (ref 0.450–4.500)

## 2015-11-09 LAB — MICROALBUMIN / CREATININE URINE RATIO
Creatinine, Urine: 51.7 mg/dL
MICROALB/CREAT RATIO: 624.2 mg/g creat — ABNORMAL HIGH (ref 0.0–30.0)
Microalbumin, Urine: 322.7 ug/mL

## 2015-11-13 ENCOUNTER — Other Ambulatory Visit: Payer: Self-pay | Admitting: Dietician

## 2015-11-13 ENCOUNTER — Encounter: Payer: Self-pay | Admitting: Dietician

## 2015-11-13 DIAGNOSIS — Z794 Long term (current) use of insulin: Principal | ICD-10-CM

## 2015-11-13 DIAGNOSIS — IMO0002 Reserved for concepts with insufficient information to code with codable children: Secondary | ICD-10-CM

## 2015-11-13 DIAGNOSIS — N182 Chronic kidney disease, stage 2 (mild): Principal | ICD-10-CM

## 2015-11-13 DIAGNOSIS — E1165 Type 2 diabetes mellitus with hyperglycemia: Secondary | ICD-10-CM

## 2015-11-13 DIAGNOSIS — E1122 Type 2 diabetes mellitus with diabetic chronic kidney disease: Secondary | ICD-10-CM

## 2015-11-13 MED ORDER — ACCU-CHEK FASTCLIX LANCETS MISC: 11 refills | Status: DC

## 2015-11-13 MED ORDER — GLUCOSE BLOOD VI STRP: ORAL_STRIP | 11 refills | Status: DC

## 2015-11-13 NOTE — Telephone Encounter (Signed)
Called pharmacy per patient request. They will inactivate the other strips for her old meter.

## 2015-11-13 NOTE — Telephone Encounter (Signed)
Gave new meter at last visit accu chek guide- she needs strips and fastclix lancets rx.  She needs them ASAP as she only has 2 strips left.  She asks that the previous script be cancelled so she doesn't;t have a double order. I will call the pharmacy after new order is sent to cancel out old meter order.

## 2015-11-14 ENCOUNTER — Encounter: Payer: Self-pay | Admitting: Dietician

## 2015-12-27 ENCOUNTER — Other Ambulatory Visit: Payer: Self-pay | Admitting: Internal Medicine

## 2015-12-27 DIAGNOSIS — I10 Essential (primary) hypertension: Secondary | ICD-10-CM

## 2016-01-31 ENCOUNTER — Other Ambulatory Visit: Payer: Self-pay | Admitting: Internal Medicine

## 2016-01-31 ENCOUNTER — Telehealth: Payer: Self-pay | Admitting: *Deleted

## 2016-01-31 DIAGNOSIS — I350 Nonrheumatic aortic (valve) stenosis: Secondary | ICD-10-CM

## 2016-01-31 NOTE — Telephone Encounter (Signed)
Call from pt with her daughter in the background.  Pt states that she's missing a bottle of medication, but doesn't know which one it is.  I had pt grab her bottles and she called out the following meds: amlodipine, aspirin, gabapentin, lisinopril, simvastatin, paroxetine, and metformin-states she's no longer on vit D.  She did not call out metoprolol and asked that I contact the pharmacy for a refill.  Pharmacy was contacted and I was informed that medication was last filled June/July and it has refills available.    Pt's dtr also wanted her to call the office about the swelling in her lower extremities.  Per patient, the swelling is "normal" for her legs and actually stated that "they look a little better" than they had before.  Pt's dtr asked about "a test" (echo?) that pt was suppose to get to find out where the swelling was coming from.  Will route info to pcp for review and to place order for Echo if appropriate.  Please advise..kg

## 2016-01-31 NOTE — Progress Notes (Unsigned)
I remember ordering the TTE but I do not see the order. New TTE order in

## 2016-01-31 NOTE — Telephone Encounter (Signed)
I could not find my original ECHO order. Re-ordered.

## 2016-02-22 ENCOUNTER — Ambulatory Visit (HOSPITAL_COMMUNITY)
Admission: RE | Admit: 2016-02-22 | Discharge: 2016-02-22 | Disposition: A | Discharge status: Home / Self Care | Payer: Medicare Other | Source: Ambulatory Visit | Attending: Internal Medicine | Admitting: Internal Medicine

## 2016-02-22 DIAGNOSIS — I050 Rheumatic mitral stenosis: Secondary | ICD-10-CM | POA: Insufficient documentation

## 2016-02-22 DIAGNOSIS — I350 Nonrheumatic aortic (valve) stenosis: Secondary | ICD-10-CM | POA: Diagnosis not present

## 2016-02-22 DIAGNOSIS — I517 Cardiomegaly: Secondary | ICD-10-CM | POA: Diagnosis not present

## 2016-02-22 DIAGNOSIS — Z953 Presence of xenogenic heart valve: Secondary | ICD-10-CM | POA: Insufficient documentation

## 2016-02-22 NOTE — Progress Notes (Signed)
  Echocardiogram 2D Echocardiogram has been performed.  [name] 02/22/2016, 4:15 PM

## 2016-03-19 ENCOUNTER — Other Ambulatory Visit: Payer: Self-pay | Admitting: Internal Medicine

## 2016-03-19 NOTE — Telephone Encounter (Signed)
Pls sch appt with me. I am happy to come down outside of [facility] to see her. 1-2 months.

## 2016-03-19 NOTE — Telephone Encounter (Signed)
Message sent to front office to schedule pt an appt. 

## 2016-03-26 ENCOUNTER — Other Ambulatory Visit: Payer: Self-pay | Admitting: Internal Medicine

## 2016-03-26 DIAGNOSIS — I10 Essential (primary) hypertension: Secondary | ICD-10-CM

## 2016-03-26 MED ORDER — AMLODIPINE BESYLATE 10 MG PO TABS
10.0000 mg | ORAL_TABLET | Freq: Every day | ORAL | 3 refills | Status: DC
Start: 2016-03-26 — End: 2016-04-24

## 2016-03-26 NOTE — Addendum Note (Signed)
Addended by: Ebbie Latus on: 03/26/2016 12:32 PM   Modules accepted: Orders

## 2016-03-26 NOTE — Telephone Encounter (Signed)
sorry

## 2016-03-26 NOTE — Telephone Encounter (Signed)
Amlodipine was refilled in Nov - qty 90 tabs w/no refills.

## 2016-03-26 NOTE — Telephone Encounter (Signed)
Got yr supply in Nov. She needs to make appt with me. I can come down in feb when not in [facility] if I have no openings

## 2016-04-24 ENCOUNTER — Ambulatory Visit (INDEPENDENT_AMBULATORY_CARE_PROVIDER_SITE_OTHER): Payer: Medicare Other | Admitting: Internal Medicine

## 2016-04-24 ENCOUNTER — Encounter: Payer: Self-pay | Admitting: Internal Medicine

## 2016-04-24 VITALS — BP 148/78 | HR 77 | Temp 98.2°F | Wt 184.1 lb

## 2016-04-24 DIAGNOSIS — Z79899 Other long term (current) drug therapy: Secondary | ICD-10-CM

## 2016-04-24 DIAGNOSIS — R197 Diarrhea, unspecified: Secondary | ICD-10-CM

## 2016-04-24 DIAGNOSIS — I700 Atherosclerosis of aorta: Secondary | ICD-10-CM | POA: Diagnosis not present

## 2016-04-24 DIAGNOSIS — E1165 Type 2 diabetes mellitus with hyperglycemia: Secondary | ICD-10-CM | POA: Diagnosis not present

## 2016-04-24 DIAGNOSIS — Z0000 Encounter for general adult medical examination without abnormal findings: Secondary | ICD-10-CM

## 2016-04-24 DIAGNOSIS — Z794 Long term (current) use of insulin: Secondary | ICD-10-CM

## 2016-04-24 DIAGNOSIS — Z7982 Long term (current) use of aspirin: Secondary | ICD-10-CM | POA: Diagnosis not present

## 2016-04-24 DIAGNOSIS — M810 Age-related osteoporosis without current pathological fracture: Secondary | ICD-10-CM

## 2016-04-24 DIAGNOSIS — F339 Major depressive disorder, recurrent, unspecified: Secondary | ICD-10-CM

## 2016-04-24 DIAGNOSIS — R6889 Other general symptoms and signs: Secondary | ICD-10-CM

## 2016-04-24 DIAGNOSIS — K5909 Other constipation: Secondary | ICD-10-CM | POA: Diagnosis not present

## 2016-04-24 DIAGNOSIS — E785 Hyperlipidemia, unspecified: Secondary | ICD-10-CM

## 2016-04-24 DIAGNOSIS — Z952 Presence of prosthetic heart valve: Secondary | ICD-10-CM

## 2016-04-24 DIAGNOSIS — I421 Obstructive hypertrophic cardiomyopathy: Secondary | ICD-10-CM

## 2016-04-24 DIAGNOSIS — I10 Essential (primary) hypertension: Secondary | ICD-10-CM

## 2016-04-24 DIAGNOSIS — Z8673 Personal history of transient ischemic attack (TIA), and cerebral infarction without residual deficits: Secondary | ICD-10-CM

## 2016-04-24 DIAGNOSIS — I2510 Atherosclerotic heart disease of native coronary artery without angina pectoris: Secondary | ICD-10-CM

## 2016-04-24 DIAGNOSIS — I350 Nonrheumatic aortic (valve) stenosis: Secondary | ICD-10-CM

## 2016-04-24 DIAGNOSIS — E1122 Type 2 diabetes mellitus with diabetic chronic kidney disease: Secondary | ICD-10-CM | POA: Diagnosis not present

## 2016-04-24 DIAGNOSIS — N182 Chronic kidney disease, stage 2 (mild): Secondary | ICD-10-CM | POA: Diagnosis not present

## 2016-04-24 DIAGNOSIS — F325 Major depressive disorder, single episode, in full remission: Secondary | ICD-10-CM

## 2016-04-24 DIAGNOSIS — I129 Hypertensive chronic kidney disease with stage 1 through stage 4 chronic kidney disease, or unspecified chronic kidney disease: Secondary | ICD-10-CM

## 2016-04-24 DIAGNOSIS — IMO0002 Reserved for concepts with insufficient information to code with codable children: Secondary | ICD-10-CM

## 2016-04-24 LAB — GLUCOSE, CAPILLARY: Glucose-Capillary: 144 mg/dL — ABNORMAL HIGH (ref 65–99)

## 2016-04-24 LAB — POCT GLYCOSYLATED HEMOGLOBIN (HGB A1C): Hemoglobin A1C: 7.1

## 2016-04-24 MED ORDER — GABAPENTIN 300 MG PO CAPS: 300.0000 mg | ORAL_CAPSULE | Freq: Three times a day (TID) | ORAL | 1 refills | Status: DC

## 2016-04-24 MED ORDER — METOPROLOL SUCCINATE ER 25 MG PO TB24: 25.0000 mg | ORAL_TABLET | Freq: Every day | ORAL | 1 refills | Status: DC

## 2016-04-24 MED ORDER — LISINOPRIL 40 MG PO TABS: 40.0000 mg | ORAL_TABLET | Freq: Every day | ORAL | 1 refills | Status: DC

## 2016-04-24 MED ORDER — INSULIN DETEMIR 100 UNIT/ML FLEXPEN: 24.0000 [IU] | PEN_INJECTOR | Freq: Every day | SUBCUTANEOUS | 1 refills | Status: DC

## 2016-04-24 MED ORDER — PAROXETINE HCL 20 MG PO TABS: 20.0000 mg | ORAL_TABLET | Freq: Every day | ORAL | 1 refills | Status: DC

## 2016-04-24 MED ORDER — SIMVASTATIN 20 MG PO TABS: 20.0000 mg | ORAL_TABLET | Freq: Every day | ORAL | 1 refills | Status: DC

## 2016-04-24 MED ORDER — DILTIAZEM HCL ER 240 MG PO CP24: 240.0000 mg | ORAL_CAPSULE | Freq: Every day | ORAL | 0 refills | Status: DC

## 2016-04-24 MED ORDER — METFORMIN HCL 1000 MG PO TABS: 1000.0000 mg | ORAL_TABLET | Freq: Two times a day (BID) | ORAL | 1 refills | Status: DC

## 2016-04-24 NOTE — Assessment & Plan Note (Signed)
Diarrhea This is a new problem. For about one year she would get a gassy sensation after eating and would run to the bathroom then has loose watery diarrhea. She would occasionally have fecal incontinence. She would continue to have a few episodes that day. This would occur a couple of times per month. It does not cause pain. Between these episodes, her stools are formed. She also complains of intermittent constipation. Her last colonoscopy was 2001.  My differential diagnosis would include SIBO, fecal impaction with loose stool swelling around it, microscopic colitis. I doubt C. difficile as she has normal stools in between these episodes. To start with, I am going to get labs to see if she has any component of malabsorption.  Plan a Check B12 Check TSH Check vitamin D    She requests a referral to podiatry but is concerned about the co-pay cost. I put in the referral and told her to ask them a call to schedule the appointment after the co-pay.

## 2016-04-24 NOTE — Assessment & Plan Note (Signed)
This problem is chronic and stable. She is on Zocor 20 daily and has no side effects to this medication. This is for secondary prevention.  Plan: continue medication

## 2016-04-24 NOTE — Assessment & Plan Note (Signed)
This problem is chronic and stable. Her most recent echo did not show any aortic stenosis. The prosthetic valve was functioning normal. She did have resting dynamic outflow obstruction. Due to her age and comorbidities I doubt she will need further ECHOs as long as she is asymptomatic.  Plan: Continue to follow

## 2016-04-24 NOTE — Assessment & Plan Note (Signed)
This problem is chronic and stable. She has diabetic proteinuria. She is on an ACE inhibitor and I am changing her amlodipine to diltiazem today to help with her proteinuria.  PLAN : Continue ACE inhibitor and diltiazem

## 2016-04-24 NOTE — Assessment & Plan Note (Signed)
This problem is chronic and stable. She is doing well on her Paxil and she has no side effects to this medication. She enjoys cooking and is enjoying living alone once again in her own apartment.  Plan: Continue medication

## 2016-04-24 NOTE — Assessment & Plan Note (Signed)
This problem is chronic and stable. She remains on an aspirin, statin, and a beta blocker. Her statin is simvastatin 20 it is a moderate intensity statin. Per guidelines, she will need to be on a high intensity statin. Since I am making other changes today, I will delay this change and discuss with her at her next appointment.  PLAN:  Cont current meds

## 2016-04-24 NOTE — Assessment & Plan Note (Signed)
This problem is chronic and stable. Her regimen is Toprol 25, Norvasc 10, lisinopril 40. Her regimen is influenced by her diabetic nephropathy, CAD, and HOCM. She is agreeable to making changes today. Therefore I am stopping her amlodipine. Although I do not think this is influencing her lower extremity edema, she does have significant lower extremity edema. I am starting diltiazem which will help with both her proteinuria and her HOCM. She is agreeable to coming back in one month to recheck her blood pressure with consideration of escalating the dose if her blood pressure is not yet controlled and her heart rate would tolerate it.  Plan Stop amlodipine Diltiazem 240 Return in one month   BP Readings from Last 3 Encounters:  04/24/16 (!) 148/78  11/08/15 (!) 159/78  06/22/15 (!) 168/81

## 2016-04-24 NOTE — Assessment & Plan Note (Signed)
This problem is chronic and stable. She remains on an aspirin and a statin for secondary prevention.  PLAN:  Cont current meds,

## 2016-04-24 NOTE — Assessment & Plan Note (Signed)
This problem is chronic and stable. It was diagnosed on echo in 2013. Her 2017 echo continue to show changes consistent with this. I do not have her on a diuretic. She is on both a beta blocker and a calcium channel blocker to assist with diastolic filling.  PLAN:  Cont current meds

## 2016-04-24 NOTE — Assessment & Plan Note (Signed)
This problem is chronic and stable. It was an incidental finding on a CT scan. She has known ASCVD. We are treating the underlying risk factors.  Plan continue to follow

## 2016-04-24 NOTE — Assessment & Plan Note (Signed)
This problem is chronic and stable. Alendronate was started sometime around 2012. She states she is no longer taking this medication. The last time she got was March 2017. This is not an issue as I had plan to stop it today as she has been on it for 5 years approximately. I did recommend calcium and vitamin D and wrote this on her AVS. I showed her and her daughter the lateral chest x-ray that showed a compression fracture in the thoracic spine.  Plan Stop alendronate sufficiently Calcium and vitamin D over-the-counter

## 2016-04-24 NOTE — Progress Notes (Signed)
   Subjective:    Patient ID: [name], female    DOB: 05/08/1945, 71 y.o.   MRN: [id number]  HPI  [name] is here for Dm F/U. Please see the A&P for the status of the pt's chronic medical problems.  ROS : per ROS section and in problem oriented charting. All other systems are negative.  PMHx, Soc hx, and / or Fam hx : She is now living alone in an apartment which is entirely on the first floor. Her daughter lives next door. Her bathtub has high sides and she requires assistance from her daughters to get in and out of it. She feels that a grab bar on the long side of the tub and the front of the tub would help her get in and out of the tub more safely. Her toilet is okay and does not require modification. She uses her walker in the house. She has a power chair but is not using it much but does plan to use that when the weather is warmer to get outside. She enjoys cooking.  Review of Systems  Cardiovascular: Positive for leg swelling.  Gastrointestinal: Positive for constipation and diarrhea. Negative for abdominal pain, anal bleeding, nausea and vomiting.  Musculoskeletal: Positive for gait problem.       Objective:   Physical Exam  Constitutional: She appears well-developed and well-nourished. No distress.  HENT:  Head: Normocephalic and atraumatic.  Right Ear: External ear normal.  Left Ear: External ear normal.  Nose: Nose normal.  Eyes: Conjunctivae and EOM are normal. Right eye exhibits no discharge. Left eye exhibits no discharge. No scleral icterus.  Cardiovascular: Normal rate and regular rhythm.   Murmur heard. Pulmonary/Chest: Effort normal and breath sounds normal.  Abdominal: Soft. Bowel sounds are normal. There is no tenderness.  Musculoskeletal: She exhibits edema. She exhibits no tenderness.  Skin: Skin is warm and dry. She is not diaphoretic.  Psychiatric: She has a normal mood and affect. Her behavior is normal. Judgment and thought content normal.            Assessment & Plan:

## 2016-04-24 NOTE — Patient Instructions (Signed)
1. Stop amlodipine / norvasc 2. Start Diltiazem 3. Take calcium 600 mg twice a day 4. Take Vitamin D 1000 IU per day 5. Lares will call you - ask how much the visit will cost 6. Return in one month

## 2016-04-24 NOTE — Assessment & Plan Note (Signed)
This problem is chronic and stable. Her A1c trend is 7.2 - 7.9 - 7.1. She is on metformin 1000 twice a day, Levemir 24 units, and NovoLog 10 3 times a day. She misplaced her meter so I do not have a download today. However she denies symptoms of hypoglycemia. Her A1c is more than adequate for her age. Since she denies hypoglycemia, her A1c likely reflects good overall diabetic control.  PLAN:  Cont current meds

## 2016-04-25 ENCOUNTER — Encounter: Payer: Self-pay | Admitting: Internal Medicine

## 2016-04-25 LAB — BMP8+ANION GAP
Anion Gap: 19 mmol/L — ABNORMAL HIGH (ref 10.0–18.0)
BUN/Creatinine Ratio: 13 (ref 12–28)
BUN: 15 mg/dL (ref 8–27)
CO2: 23 mmol/L (ref 18–29)
Calcium: 9.5 mg/dL (ref 8.7–10.3)
Chloride: 100 mmol/L (ref 96–106)
Creatinine, Ser: 1.16 mg/dL — ABNORMAL HIGH (ref 0.57–1.00)
GFR calc Af Amer: 54 mL/min/{1.73_m2} — ABNORMAL LOW (ref >59)
GFR calc non Af Amer: 47 mL/min/{1.73_m2} — ABNORMAL LOW (ref >59)
Glucose: 119 mg/dL — ABNORMAL HIGH (ref 65–99)
Potassium: 4.3 mmol/L (ref 3.5–5.2)
Sodium: 142 mmol/L (ref 134–144)

## 2016-04-25 LAB — TSH: TSH: 1.43 u[IU]/mL (ref 0.450–4.500)

## 2016-04-25 LAB — VITAMIN B12: Vitamin B-12: 230 pg/mL — ABNORMAL LOW (ref 232–1245)

## 2016-04-25 LAB — VITAMIN D 25 HYDROXY (VIT D DEFICIENCY, FRACTURES): Vit D, 25-Hydroxy: 17.6 ng/mL — ABNORMAL LOW (ref 30.0–100.0)

## 2016-04-28 ENCOUNTER — Telehealth: Payer: Self-pay | Admitting: Internal Medicine

## 2016-04-28 NOTE — Telephone Encounter (Signed)
CALLED PATIENT GAVE HER NUMBER FOR [employer], WHICH IS TRANSPORTATION FOR SENIORS WITH REGULAR MEDICARE A & B.

## 2016-05-14 ENCOUNTER — Ambulatory Visit: Payer: Medicare Other | Admitting: Podiatry

## 2016-05-14 NOTE — Addendum Note (Signed)
Addended by: Hulan Fray on: 05/14/2016 07:13 PM   Modules accepted: Orders

## 2016-05-22 ENCOUNTER — Ambulatory Visit (INDEPENDENT_AMBULATORY_CARE_PROVIDER_SITE_OTHER): Payer: Medicare Other | Admitting: Internal Medicine

## 2016-05-22 ENCOUNTER — Encounter: Payer: Self-pay | Admitting: Internal Medicine

## 2016-05-22 DIAGNOSIS — E1121 Type 2 diabetes mellitus with diabetic nephropathy: Secondary | ICD-10-CM | POA: Diagnosis not present

## 2016-05-22 DIAGNOSIS — N182 Chronic kidney disease, stage 2 (mild): Secondary | ICD-10-CM

## 2016-05-22 DIAGNOSIS — Z794 Long term (current) use of insulin: Secondary | ICD-10-CM | POA: Diagnosis not present

## 2016-05-22 DIAGNOSIS — M810 Age-related osteoporosis without current pathological fracture: Secondary | ICD-10-CM | POA: Diagnosis not present

## 2016-05-22 DIAGNOSIS — R600 Localized edema: Secondary | ICD-10-CM | POA: Diagnosis not present

## 2016-05-22 DIAGNOSIS — Z79899 Other long term (current) drug therapy: Secondary | ICD-10-CM

## 2016-05-22 DIAGNOSIS — I10 Essential (primary) hypertension: Secondary | ICD-10-CM | POA: Diagnosis not present

## 2016-05-22 DIAGNOSIS — E1122 Type 2 diabetes mellitus with diabetic chronic kidney disease: Secondary | ICD-10-CM

## 2016-05-22 MED ORDER — ROSUVASTATIN CALCIUM 20 MG PO TABS: 20.0000 mg | ORAL_TABLET | Freq: Every day | ORAL | 3 refills | Status: DC

## 2016-05-22 NOTE — Progress Notes (Signed)
   Subjective:    Patient ID: [name], female    DOB: 1945/11/13, 71 y.o.   MRN: [id number]  HPI  LATONIA CONROW is here for HTN F/U. Please see the A&P for the status of the pt's chronic medical problems.  ROS : per ROS section and in problem oriented charting. All other systems are negative.  PMHx, Soc hx, and / or Fam hx : She lives alone in an apartment. Her daughter lives in a different apartment next door. She got handrails installed in her bathtub that she still requires her daughter to be present to get in and out of the tub. All other areas of the apartment are accessible to her. She is completing the scat paperwork. She rarely leaves the house unless it is to come to an appointment. She is nervous when she does and prefers to have family with her.  Review of Systems  Constitutional: Positive for activity change.  Cardiovascular: Positive for leg swelling. Negative for chest pain.  Neurological: Negative for dizziness and headaches.  Psychiatric/Behavioral: The patient is not nervous/anxious.        Objective:   Physical Exam  Constitutional: She appears well-developed and well-nourished. No distress.  HENT:  Head: Normocephalic and atraumatic.  Right Ear: External ear normal.  Left Ear: External ear normal.  Nose: Nose normal.  Eyes: Conjunctivae and EOM are normal. Right eye exhibits no discharge. Left eye exhibits no discharge. No scleral icterus.  Musculoskeletal: She exhibits edema. She exhibits no tenderness.  +2 pitting edema B to knees  Skin: She is not diaphoretic.  Psychiatric: She has a normal mood and affect. Her behavior is normal. Judgment and thought content normal.          Assessment & Plan:

## 2016-05-22 NOTE — Assessment & Plan Note (Signed)
This problem is chronic and stable. At her last appointment, I had changed her amlodipine to diltiazem 240 due to diabetic nephropathy. She went to the pharmacy that instead of receiving diltiazem, she was given a bottle of tacrolimus with another patient's name on a bottle. Thankfully she did not take any. Nurse Ulis Rias was calling the pharmacy to report the mistake and to see about getting the patient a reimbursement on her co-pay. I still want to make the change from amlodipine to diltiazem and Ulis Rias was asking the pharmacy to fill the diltiazem so she could pick it up. I gave her written instructions on the antihypertensive to hold and the name of the new antihypertensive being prescribed.  Plan : Continue Toprol 25 and lisinopril 40 Start diltiazem 240 Stop amlodipine Return to clinic 4-6 weeks for blood pressure check  BP Readings from Last 3 Encounters:  05/22/16 (!) 149/62  04/24/16 (!) 148/78  11/08/15 (!) 159/78

## 2016-05-22 NOTE — Patient Instructions (Addendum)
1. Stop amlodipine / norvasc (blood pressure) 2. Start Diltiazem (blood pressure) 3. Take calcium 600 mg twice a day 4. Take Vitamin D 1000 IU per day 5. Stop zocor (simvastaian) for cholesterol 6. Start crestor for cholesterol 7. Return in 4-6 weeks for blood pressure check

## 2016-05-22 NOTE — Assessment & Plan Note (Signed)
This problem is chronic and stable. Since she had been on her alendronate for many years I had asked her at her last appointment to stop it. Today I was able to confirm that she stopped the alendronate. I re-stressed to her today that I would like her to take calcium and vitamin D. She has not been able to get these due to cost but hopes to be able to afford them in April.  Plan : Calcium and vitamin D over-the-counter

## 2016-05-22 NOTE — Assessment & Plan Note (Signed)
This problem is chronic and stable. She thinks the edema is a little bit better today although I do not notice much change on examination. She had a full workup for etiologies in the most likely etiology is chronic venous insufficiency.  Plan : Continue to follow

## 2016-05-22 NOTE — Assessment & Plan Note (Signed)
This problem is chronic and stable. Her A1c trend is 7.7 - 7.9 - 7.1. She'll resume metformin thousand twice a day, Levemir 24, and NovoLog 10 units 3 times a day. She did not bring her meter at her last appointment or today. She states she needs a new battery for her meter. She denies hypoglycemia symptoms. With her comorbidities, hypoglycemia is more concerning than hyperglycemia. She is not experiencing any overt hypoglycemic symptoms and therefore I will assume her diabetes is under acceptable control.  PLAN:  Cont current meds

## 2016-05-27 ENCOUNTER — Other Ambulatory Visit: Payer: Self-pay | Admitting: Internal Medicine

## 2016-05-27 DIAGNOSIS — N182 Chronic kidney disease, stage 2 (mild): Principal | ICD-10-CM

## 2016-05-27 DIAGNOSIS — Z794 Long term (current) use of insulin: Principal | ICD-10-CM

## 2016-05-27 DIAGNOSIS — E785 Hyperlipidemia, unspecified: Secondary | ICD-10-CM

## 2016-05-27 DIAGNOSIS — I10 Essential (primary) hypertension: Secondary | ICD-10-CM

## 2016-05-27 DIAGNOSIS — IMO0002 Reserved for concepts with insufficient information to code with codable children: Secondary | ICD-10-CM

## 2016-05-27 DIAGNOSIS — E1165 Type 2 diabetes mellitus with hyperglycemia: Secondary | ICD-10-CM

## 2016-05-27 DIAGNOSIS — E1122 Type 2 diabetes mellitus with diabetic chronic kidney disease: Secondary | ICD-10-CM

## 2016-05-28 ENCOUNTER — Telehealth: Payer: Self-pay

## 2016-05-28 MED ORDER — "PEN NEEDLES 3/16"" 31G X 5 MM MISC": 1.0000 | Freq: Four times a day (QID) | 11 refills | Status: DC

## 2016-05-28 NOTE — Telephone Encounter (Signed)
Pt is calling back to speak with a nurse about meds. Please call pt back.  

## 2016-05-28 NOTE — Telephone Encounter (Signed)
Pt calling about meds please call

## 2016-05-28 NOTE — Telephone Encounter (Signed)
Pt states she has not gotten her pen needles Called pharmacy, tech states she picked them up today Pt states she didn't Pt advised to call pharmacy and discuss with them then if needed call triage back She was agreeable

## 2016-06-14 ENCOUNTER — Other Ambulatory Visit: Payer: Self-pay | Admitting: Internal Medicine

## 2016-07-09 ENCOUNTER — Telehealth: Payer: Self-pay | Admitting: Internal Medicine

## 2016-07-09 NOTE — Telephone Encounter (Signed)
LMOM for APPT with Dr. Lynnae January on 07/10/2016

## 2016-07-10 ENCOUNTER — Ambulatory Visit (INDEPENDENT_AMBULATORY_CARE_PROVIDER_SITE_OTHER): Payer: Medicare Other | Admitting: Internal Medicine

## 2016-07-10 ENCOUNTER — Encounter: Payer: Self-pay | Admitting: Internal Medicine

## 2016-07-10 DIAGNOSIS — Z794 Long term (current) use of insulin: Secondary | ICD-10-CM | POA: Diagnosis not present

## 2016-07-10 DIAGNOSIS — E1122 Type 2 diabetes mellitus with diabetic chronic kidney disease: Secondary | ICD-10-CM

## 2016-07-10 DIAGNOSIS — Z7982 Long term (current) use of aspirin: Secondary | ICD-10-CM | POA: Diagnosis not present

## 2016-07-10 DIAGNOSIS — E1121 Type 2 diabetes mellitus with diabetic nephropathy: Secondary | ICD-10-CM

## 2016-07-10 DIAGNOSIS — E785 Hyperlipidemia, unspecified: Secondary | ICD-10-CM | POA: Diagnosis not present

## 2016-07-10 DIAGNOSIS — N182 Chronic kidney disease, stage 2 (mild): Secondary | ICD-10-CM

## 2016-07-10 DIAGNOSIS — Z952 Presence of prosthetic heart valve: Secondary | ICD-10-CM

## 2016-07-10 DIAGNOSIS — R600 Localized edema: Secondary | ICD-10-CM

## 2016-07-10 DIAGNOSIS — I10 Essential (primary) hypertension: Secondary | ICD-10-CM | POA: Diagnosis not present

## 2016-07-10 DIAGNOSIS — Z951 Presence of aortocoronary bypass graft: Secondary | ICD-10-CM

## 2016-07-10 DIAGNOSIS — I2510 Atherosclerotic heart disease of native coronary artery without angina pectoris: Secondary | ICD-10-CM | POA: Diagnosis not present

## 2016-07-10 DIAGNOSIS — Z79899 Other long term (current) drug therapy: Secondary | ICD-10-CM | POA: Diagnosis not present

## 2016-07-10 MED ORDER — ROSUVASTATIN CALCIUM 20 MG PO TABS: 20.0000 mg | ORAL_TABLET | Freq: Every day | ORAL | 3 refills | Status: DC

## 2016-07-10 NOTE — Progress Notes (Signed)
Spoke to [facility] pharmacist to d/c simvastatin and amlodipine, start rosuvastatin and diltiazem.

## 2016-07-10 NOTE — Progress Notes (Signed)
   Subjective:    Patient ID: [name], female    DOB: 1945-03-18, 71 y.o.   MRN: [id number]  HPI  MEAGHEN VECCHIARELLI is here for HTN F/U. Please see the A&P for the status of the pt's chronic medical problems.  ROS : per ROS section and in problem oriented charting. All other systems are negative.  PMHx, Soc hx, and / or Fam hx : She is enjoying her new apartment where she lives alone. Her daughter lives next door and frequently checks on her. Today was her first day taking scat and so far she is pleased with the service. Her granddaughter was able to accompany her Denny Peon scat to her appointment today. He  Review of Systems  Constitutional: Negative for fatigue and unexpected weight change.  Cardiovascular: Positive for leg swelling. Negative for chest pain.  Neurological:       No falls Moves slowly  Psychiatric/Behavioral: Negative for sleep disturbance.       Objective:   Physical Exam  Constitutional: She appears well-developed and well-nourished. No distress.  HENT:  Head: Normocephalic and atraumatic.  Right Ear: External ear normal.  Left Ear: External ear normal.  Nose: Nose normal.  Eyes: Conjunctivae and EOM are normal.  Musculoskeletal: She exhibits edema. She exhibits no tenderness.  Skin: Skin is warm and dry. She is not diaphoretic.  Changes consistent with chronic venous insufficiency of the lower extremities bilaterally. There is some lichenification. Hyperpigmentation bilaterally. +1 edema bilaterally  Psychiatric: She has a normal mood and affect. Her behavior is normal. Judgment and thought content normal.          Assessment & Plan:

## 2016-07-10 NOTE — Assessment & Plan Note (Addendum)
This problem is chronic and stable. She takes metformin thousand twice a day, Levemir 24 units, and NovoLog 10 units 3 times a day. Her most recent A1c of 7.1 was at goal. She did not bring her meter today and states that it is not working and she thinks the battery needs to be replaced. I offered assistance today from [name] our diabetes educator but she did not think that she needed help to get it repaired. Her foot exam was completed today. All other diabetic metrics are up-to-date. My overall goal and her would be less than 7.5 as I think her comorbidities are significant and her life expectancy is not long enough to develop or worsen microvascular complications. She does have diabetic nephropathy currently. She does have macrovascular complications however.  PLAN:  Cont current meds

## 2016-07-10 NOTE — Assessment & Plan Note (Addendum)
This problem is chronic and stable. The granddaughter states she has never seen her legs is swollen. They look unchanged to me. Workup has included a TTE, BNP, TSH, liver function tests, and microalbumin. Her microalbumin is elevated but not in the nephrotic range ratio. I think this is due to chronic venous insufficiency and I explained this condition this patient and her granddaughter.  PLAN : Continue to follow.

## 2016-07-10 NOTE — Assessment & Plan Note (Signed)
This problem is chronic and stable. I had intended to stop her simvastatin and change her to Crestor at her last appointment since she has known coronary vascular disease and this would be in line with the new guidelines. I checked her pharmacy refill history her Epic and she has not picked up Crestor. I will check with the pharmacy to ensure they have Sterling all of her simvastatin refills and has refills for Crestor available. Her most recent LDL was 79 about a year ago. There is no reason to recheck this as I am changing her to a high potency statin.  PLAN:  Cont current meds

## 2016-07-10 NOTE — Assessment & Plan Note (Signed)
This problem is chronic. She was not aware that she had coronary artery disease and thought that her open heart surgery was for only the valve replacement. I reviewed the cardiac catheterization and operative note which confirmed she had RCA disease and did have a bypass to the RCA with a saphenous vein graft along with her aortic soft replacement. She has no chest pain although her physical activity is rather limited. She is on appropriate treatment with a beta blocker, aspirin, and statin.  PLAN:  Cont current meds

## 2016-07-10 NOTE — Assessment & Plan Note (Signed)
This problem is chronic and uncontrolled. At her last appointment, I had her amlodipine and started diltiazem 240 but she has not yet made that switch. Other medications include Toprol 25 and lisinopril 40 mg. She continues her amlodipine 10 mg. Her blood pressure is uncontrolled. I had wanted to change from amlodipine to diltiazem due to diabetic nephropathy. I we will work with her clinical pharmacist to ensure that she has the prescription for diltiazem at her pharmacy and that all remaining refills of amlodipine have been DC'd.  PLAN:  Cont current meds F/U 2 -3 months

## 2016-07-10 NOTE — Patient Instructions (Signed)
1. Pls see me in 2-3 months 2. Think about foot doctor, stockings

## 2016-07-10 NOTE — Addendum Note (Signed)
Addended by: Forde Dandy on: 07/10/2016 03:44 PM   Modules accepted: Orders

## 2016-07-14 ENCOUNTER — Telehealth: Payer: Self-pay | Admitting: *Deleted

## 2016-07-14 NOTE — Telephone Encounter (Signed)
[facility] - pt's insurance will not pay for Crestor nor generic.  But will pay for Atorvastatin - please advise and send new rx. Thanks

## 2016-07-16 MED ORDER — ATORVASTATIN CALCIUM 40 MG PO TABS: 40.0000 mg | ORAL_TABLET | Freq: Every day | ORAL | 3 refills | Status: DC

## 2016-07-16 NOTE — Telephone Encounter (Signed)
New rx sent in

## 2016-07-29 ENCOUNTER — Other Ambulatory Visit: Payer: Self-pay | Admitting: Internal Medicine

## 2016-07-29 DIAGNOSIS — N182 Chronic kidney disease, stage 2 (mild): Principal | ICD-10-CM

## 2016-07-29 DIAGNOSIS — IMO0002 Reserved for concepts with insufficient information to code with codable children: Secondary | ICD-10-CM

## 2016-07-29 DIAGNOSIS — E1122 Type 2 diabetes mellitus with diabetic chronic kidney disease: Secondary | ICD-10-CM

## 2016-07-29 DIAGNOSIS — Z794 Long term (current) use of insulin: Principal | ICD-10-CM

## 2016-07-29 DIAGNOSIS — E1165 Type 2 diabetes mellitus with hyperglycemia: Secondary | ICD-10-CM

## 2016-08-11 ENCOUNTER — Other Ambulatory Visit: Payer: Self-pay | Admitting: Internal Medicine

## 2016-08-11 DIAGNOSIS — E1122 Type 2 diabetes mellitus with diabetic chronic kidney disease: Secondary | ICD-10-CM

## 2016-08-11 DIAGNOSIS — N182 Chronic kidney disease, stage 2 (mild): Principal | ICD-10-CM

## 2016-08-11 DIAGNOSIS — IMO0002 Reserved for concepts with insufficient information to code with codable children: Secondary | ICD-10-CM

## 2016-08-11 DIAGNOSIS — Z794 Long term (current) use of insulin: Principal | ICD-10-CM

## 2016-08-11 DIAGNOSIS — E1165 Type 2 diabetes mellitus with hyperglycemia: Secondary | ICD-10-CM

## 2016-08-12 NOTE — Telephone Encounter (Signed)
insulin aspart (NOVOLOG FLEXPEN) 100 UNIT/ML FlexPen, refill request. Would like it by today, pt did not have any to take since yesterday.

## 2016-08-13 NOTE — Telephone Encounter (Signed)
Patient has been without her insulin for 2 days. Waiting for prescription to be filled.  Please call patient.

## 2016-08-14 NOTE — Telephone Encounter (Signed)
Called pt - no answer and no VM box. [facility] called [facility] - left message Novolog flexpen was changed to 10 units 3 times daily w/meals on 6/5 per Dr Lynnae January; rx was electronic sent.

## 2016-10-06 ENCOUNTER — Other Ambulatory Visit: Payer: Self-pay | Admitting: Internal Medicine

## 2016-10-06 DIAGNOSIS — N182 Chronic kidney disease, stage 2 (mild): Secondary | ICD-10-CM

## 2016-10-06 DIAGNOSIS — IMO0002 Reserved for concepts with insufficient information to code with codable children: Secondary | ICD-10-CM

## 2016-10-06 DIAGNOSIS — Z794 Long term (current) use of insulin: Secondary | ICD-10-CM

## 2016-10-06 DIAGNOSIS — E1122 Type 2 diabetes mellitus with diabetic chronic kidney disease: Secondary | ICD-10-CM

## 2016-10-06 DIAGNOSIS — I10 Essential (primary) hypertension: Secondary | ICD-10-CM

## 2016-10-06 DIAGNOSIS — E1165 Type 2 diabetes mellitus with hyperglycemia: Secondary | ICD-10-CM

## 2016-10-07 ENCOUNTER — Other Ambulatory Visit: Payer: Self-pay | Admitting: Internal Medicine

## 2016-10-07 NOTE — Telephone Encounter (Signed)
On crestor noot simva. Pls sch appt Clint Guy F/U Aug / sept

## 2016-10-08 ENCOUNTER — Other Ambulatory Visit: Payer: Self-pay | Admitting: Internal Medicine

## 2016-10-08 NOTE — Telephone Encounter (Signed)
She is on atorva not simva

## 2016-11-06 ENCOUNTER — Ambulatory Visit: Payer: Medicare Other | Admitting: Internal Medicine

## 2016-11-11 ENCOUNTER — Other Ambulatory Visit: Payer: Self-pay | Admitting: Internal Medicine

## 2016-12-04 ENCOUNTER — Ambulatory Visit: Payer: Medicare Other | Admitting: Internal Medicine

## 2017-01-05 ENCOUNTER — Other Ambulatory Visit: Payer: Self-pay | Admitting: Internal Medicine

## 2017-01-05 DIAGNOSIS — I10 Essential (primary) hypertension: Secondary | ICD-10-CM

## 2017-01-29 ENCOUNTER — Ambulatory Visit (INDEPENDENT_AMBULATORY_CARE_PROVIDER_SITE_OTHER): Payer: Medicare Other | Admitting: Internal Medicine

## 2017-01-29 ENCOUNTER — Encounter: Payer: Self-pay | Admitting: Internal Medicine

## 2017-01-29 VITALS — BP 149/71 | HR 75 | Temp 98.5°F | Wt 177.3 lb

## 2017-01-29 DIAGNOSIS — Z7982 Long term (current) use of aspirin: Secondary | ICD-10-CM

## 2017-01-29 DIAGNOSIS — Z952 Presence of prosthetic heart valve: Secondary | ICD-10-CM

## 2017-01-29 DIAGNOSIS — E538 Deficiency of other specified B group vitamins: Secondary | ICD-10-CM

## 2017-01-29 DIAGNOSIS — R011 Cardiac murmur, unspecified: Secondary | ICD-10-CM

## 2017-01-29 DIAGNOSIS — Z0000 Encounter for general adult medical examination without abnormal findings: Secondary | ICD-10-CM

## 2017-01-29 DIAGNOSIS — R531 Weakness: Secondary | ICD-10-CM

## 2017-01-29 DIAGNOSIS — Z794 Long term (current) use of insulin: Secondary | ICD-10-CM

## 2017-01-29 DIAGNOSIS — I129 Hypertensive chronic kidney disease with stage 1 through stage 4 chronic kidney disease, or unspecified chronic kidney disease: Secondary | ICD-10-CM | POA: Diagnosis not present

## 2017-01-29 DIAGNOSIS — R600 Localized edema: Secondary | ICD-10-CM

## 2017-01-29 DIAGNOSIS — F325 Major depressive disorder, single episode, in full remission: Secondary | ICD-10-CM

## 2017-01-29 DIAGNOSIS — I350 Nonrheumatic aortic (valve) stenosis: Secondary | ICD-10-CM

## 2017-01-29 DIAGNOSIS — E1122 Type 2 diabetes mellitus with diabetic chronic kidney disease: Secondary | ICD-10-CM | POA: Diagnosis present

## 2017-01-29 DIAGNOSIS — Z8673 Personal history of transient ischemic attack (TIA), and cerebral infarction without residual deficits: Secondary | ICD-10-CM

## 2017-01-29 DIAGNOSIS — I10 Essential (primary) hypertension: Secondary | ICD-10-CM

## 2017-01-29 DIAGNOSIS — Z23 Encounter for immunization: Secondary | ICD-10-CM

## 2017-01-29 DIAGNOSIS — Z79899 Other long term (current) drug therapy: Secondary | ICD-10-CM | POA: Diagnosis not present

## 2017-01-29 DIAGNOSIS — I2510 Atherosclerotic heart disease of native coronary artery without angina pectoris: Secondary | ICD-10-CM

## 2017-01-29 DIAGNOSIS — N182 Chronic kidney disease, stage 2 (mild): Secondary | ICD-10-CM

## 2017-01-29 DIAGNOSIS — F339 Major depressive disorder, recurrent, unspecified: Secondary | ICD-10-CM

## 2017-01-29 LAB — POCT GLYCOSYLATED HEMOGLOBIN (HGB A1C): Hemoglobin A1C: 6.9

## 2017-01-29 LAB — GLUCOSE, CAPILLARY: Glucose-Capillary: 120 mg/dL — ABNORMAL HIGH (ref 65–99)

## 2017-01-29 MED ORDER — CYANOCOBALAMIN 1000 MCG/ML IJ SOLN
1000.0000 ug | Freq: Once | INTRAMUSCULAR | Status: AC
  Administered 2017-01-29: 1000 ug via INTRAMUSCULAR

## 2017-01-29 NOTE — Assessment & Plan Note (Signed)
She received her flu shot today. She was interested on Me[employer] Wheels and we gave her and her daughter the information to contact them. We referred her to ophthalmology for her diabetic eye exam She was interested in mail order pharmacy and I will be sending all of her medications into [facility] family pharmacy.

## 2017-01-29 NOTE — Assessment & Plan Note (Signed)
This problem is chronic and stable. She feels that her legs are improved but she still has significant edema. We discussed that she will develop chronic venous insufficiency changes including hyperpigmentation, but she has are noticed, and at increased risk for cellulitis. I reviewed with the patient and her daughter signs and symptoms of cellulitis. I discussed that she can get stockings over-the-counter as I'm concerned she would have difficulty getting on prescription stockings herself. I encouraged her to keep her legs elevated when she is not walking.  PLAN : Lifestyle changes and over-the-counter stockings    This ended up in the overview instead assessment and plan from a past appointment but I do not know which appointment. She has noticed this for several months. They "swell quite a bit" but comes and goes and sometimes, if off feet, will go away completely. No pain. Better also in AM. My notes have indicated some edema, but not this bad, in past. No respe sxs. No scale at home so unknown weight gain but none per our scale.   The skin changes indicate this is more chronic than just a few months. The diff dx for cause include cardiac (HF, Aortic valve failure), progression of nephropathy, sig thyroid dysfxn, liver failure, hypoalb, or CVI. Will start W/U. Withhold diuretic for now 2/2 HOCM and wanting to recheck Cr.   PLAN : TTE BNP TSH CMP microalb F/U 2-4 weeks

## 2017-01-29 NOTE — Patient Instructions (Addendum)
1. Take Vitamin B12 1000 mcg once a day 2. See me in 3 months 3. Your 3 month sugar test (A1C) is excellent 4. We are referring you to an eye doctor

## 2017-01-29 NOTE — Assessment & Plan Note (Signed)
This problem is chronic and stable. Her most recent echo was in December 2017 and showed no significant abnormalities. Recommendations do indicate a need for annual TTE after 5-10 years. I will need to discuss with her another echo at her next appointment. I believe she has concerns, or at least has had in the past, about frequent testing, transportation, and cost.  PLAN : Discussed TTE at her next appointment

## 2017-01-29 NOTE — Assessment & Plan Note (Signed)
This problem is chronic and stable. Her stroke was postop for her aortic valve replacement and neurology discharged her on an aspirin which she continues today. She is ambulatory and is able to move at home independently.  PLAN:  Cont current meds

## 2017-01-29 NOTE — Assessment & Plan Note (Signed)
This problem is chronic and stable. Her A1c trend has been 7.1 -6.9 today. She did not have a meter at home so does not have log. [name], our diabetes educator, has supplied her with a meter. She was able to tell me that she took a sugar pill twice a day which would be metformin at thousand twice a day. She notes that she takes 2 insulins and was able to state NovoLog 10 3 times a day but could not remember the name or the dose of her Levemir 24 units. She denies having any hypoglycemia symptoms. Hopefully, now that she has a meter, I will be able to get some home CBG readings.  PLAN:  Cont current meds

## 2017-01-29 NOTE — Assessment & Plan Note (Signed)
This problem is chronic and stable. She is on a beta blocker, aspirin, and statin. She has no chest pain.  PLAN:  Cont current meds

## 2017-01-29 NOTE — Assessment & Plan Note (Signed)
This problem is chronic and stable. She is on diltiazem 240, Toprol 25, and lisinopril 40. She does not check her blood pressure at home. She has no side effects to these medications. Also blood pressure is not controlled, I will increase her diltiazem to 360 and asked her to call if she has any side effects, she can call me. I will ask her to return in one month so that we can recheck her blood pressure, get blood work, and give her another vitamin B12 injection.  PLAN:  Cont current meds    BP Readings from Last 3 Encounters:  01/29/17 (!) 149/71  07/10/16 (!) 158/69  05/22/16 (!) 149/62

## 2017-01-29 NOTE — Progress Notes (Signed)
   Subjective:    Patient ID: [name], female    DOB: 07-Mar-1945, 71 y.o.   MRN: [id number]  HPI  JANDI SWIGER is here for DM F/U. Please see the A&P for the status of the pt's chronic medical problems.  ROS : per ROS section and in problem oriented charting. All other systems are negative.  PMHx, Soc hx, and / or Fam hx : still in her "new appt" and daughter lives next door. Uses [employer].   Review of Systems  Constitutional: Positive for fatigue. Negative for unexpected weight change.  HENT: Negative for rhinorrhea.   Eyes: Negative for itching.  Cardiovascular: Positive for leg swelling. Negative for chest pain.  Skin: Positive for color change.  Neurological: Positive for weakness. Negative for dizziness and light-headedness.       Objective:   Physical Exam  Constitutional: She appears well-developed and well-nourished. No distress.  HENT:  Head: Normocephalic and atraumatic.  Right Ear: External ear normal.  Left Ear: External ear normal.  Nose: Nose normal.  Eyes: Conjunctivae and EOM are normal. Right eye exhibits no discharge. Left eye exhibits no discharge. No scleral icterus.  Neck: Neck supple. No tracheal deviation present. No thyromegaly present.  Cardiovascular: Normal rate and regular rhythm.  Murmur heard. Loud 3/6 holosystolic murmur L and R sternal border and loud S2  Pulmonary/Chest: Effort normal and breath sounds normal. No stridor. No respiratory distress.  Musculoskeletal: She exhibits edema. She exhibits no tenderness or deformity.  Lymphadenopathy:    She has no cervical adenopathy.  Neurological: She is alert.  Skin: Skin is warm and dry. She is not diaphoretic.  Psychiatric: She has a normal mood and affect. Her behavior is normal. Judgment and thought content normal.       Per Dr Maudie Mercury : The only medications I see filled in the past 6 months are Novolog, lisinopril, metoprolol, gabapentin, and paroxetine.   Assessment & Plan:  I am  attempting to schedule follow-up in 1 month for blood pressure as I increased her diltiazem to 360, she needs blood work as she forgot to go to the lab, and she can get another vitamin B12 injection.

## 2017-01-29 NOTE — Assessment & Plan Note (Signed)
This problem is chronic and stable. She continues to take her Paxil and feels that it is doing well. She has no side effects to this medication.  PLAN:  Cont current meds

## 2017-01-29 NOTE — Assessment & Plan Note (Signed)
This problem is chronic and stable. We reviewed her creatinine trend & her GFR is 54. I asked about nonsteroidals and she states she uses an over-the-counter store brand pain reliever. She did not know if this was acetaminophen or a nonsteroidal. I verbally, with her and her daughter, review of the listed nonsteroidals and asked that she not take this on a regular basis.Marland Kitchen  PLAN : BMP but unfortunately the patient bypass the lab on her way out and we will need to ask her to come back for a lab visit

## 2017-03-31 ENCOUNTER — Other Ambulatory Visit: Payer: Self-pay

## 2017-03-31 DIAGNOSIS — I10 Essential (primary) hypertension: Secondary | ICD-10-CM

## 2017-03-31 DIAGNOSIS — IMO0002 Reserved for concepts with insufficient information to code with codable children: Secondary | ICD-10-CM

## 2017-03-31 DIAGNOSIS — Z794 Long term (current) use of insulin: Secondary | ICD-10-CM

## 2017-03-31 DIAGNOSIS — E1165 Type 2 diabetes mellitus with hyperglycemia: Secondary | ICD-10-CM

## 2017-03-31 DIAGNOSIS — E1122 Type 2 diabetes mellitus with diabetic chronic kidney disease: Secondary | ICD-10-CM

## 2017-03-31 DIAGNOSIS — N182 Chronic kidney disease, stage 2 (mild): Secondary | ICD-10-CM

## 2017-03-31 MED ORDER — DILTIAZEM HCL ER 240 MG PO CP24: 240.0000 mg | ORAL_CAPSULE | Freq: Every day | ORAL | 3 refills | Status: DC

## 2017-03-31 MED ORDER — GABAPENTIN 300 MG PO CAPS: 300.0000 mg | ORAL_CAPSULE | Freq: Three times a day (TID) | ORAL | 3 refills | Status: DC

## 2017-03-31 MED ORDER — GLUCOSE BLOOD VI STRP: ORAL_STRIP | 11 refills | Status: DC

## 2017-03-31 MED ORDER — ASPIRIN 81 MG PO TBEC: DELAYED_RELEASE_TABLET | ORAL | 3 refills | Status: DC

## 2017-03-31 MED ORDER — INSULIN ASPART 100 UNIT/ML FLEXPEN: 10.0000 [IU] | PEN_INJECTOR | Freq: Three times a day (TID) | SUBCUTANEOUS | 3 refills | Status: DC

## 2017-03-31 MED ORDER — PEN NEEDLES 3/16" 31G X 5 MM MISC
1.0000 | Freq: Four times a day (QID) | 11 refills | Status: DC
Start: 2017-03-31 — End: 2018-09-22

## 2017-03-31 MED ORDER — ACCU-CHEK FASTCLIX LANCETS MISC: 11 refills | Status: DC

## 2017-03-31 MED ORDER — METOPROLOL SUCCINATE ER 25 MG PO TB24: 25.0000 mg | ORAL_TABLET | Freq: Every day | ORAL | 3 refills | Status: DC

## 2017-03-31 MED ORDER — INSULIN DETEMIR 100 UNIT/ML FLEXPEN: 24.0000 [IU] | PEN_INJECTOR | Freq: Every day | SUBCUTANEOUS | 3 refills | Status: DC

## 2017-03-31 MED ORDER — PAROXETINE HCL 20 MG PO TABS: 20.0000 mg | ORAL_TABLET | Freq: Every day | ORAL | 3 refills | Status: DC

## 2017-03-31 MED ORDER — METFORMIN HCL 1000 MG PO TABS: 1000.0000 mg | ORAL_TABLET | Freq: Two times a day (BID) | ORAL | 3 refills | Status: DC

## 2017-03-31 MED ORDER — LISINOPRIL 40 MG PO TABS: 40.0000 mg | ORAL_TABLET | Freq: Every day | ORAL | 3 refills | Status: DC

## 2017-03-31 NOTE — Telephone Encounter (Signed)
[name] stated pt is changing pharmacy and need all her tranfer to [facility].

## 2017-03-31 NOTE — Telephone Encounter (Signed)
Lennette Bihari with [facility] needs to speak with a nurse about meds. Please call back @ [phone].

## 2017-04-01 ENCOUNTER — Telehealth: Payer: Self-pay | Admitting: Dietician

## 2017-04-01 DIAGNOSIS — E1122 Type 2 diabetes mellitus with diabetic chronic kidney disease: Secondary | ICD-10-CM

## 2017-04-01 DIAGNOSIS — N182 Chronic kidney disease, stage 2 (mild): Principal | ICD-10-CM

## 2017-04-01 DIAGNOSIS — Z794 Long term (current) use of insulin: Principal | ICD-10-CM

## 2017-04-01 DIAGNOSIS — IMO0002 Reserved for concepts with insufficient information to code with codable children: Secondary | ICD-10-CM

## 2017-04-01 DIAGNOSIS — E1165 Type 2 diabetes mellitus with hyperglycemia: Secondary | ICD-10-CM

## 2017-04-01 MED ORDER — GLUCOSE BLOOD VI STRP: ORAL_STRIP | 3 refills | Status: DC

## 2017-04-01 MED ORDER — ACCU-CHEK FASTCLIX LANCETS MISC: 3 refills | Status: DC

## 2017-04-01 NOTE — Telephone Encounter (Signed)
Called patient back- she left a message that she cannot get her diabetes testing supplies- no answer. Called [facility]- they do not bill Medicare part B for the testing suppies so she needs to get them somewhere else.  Left [name] that in a message and also spoke to her daughter. suggest 3 months supply of strips and lancets be sent to [facility]

## 2017-04-02 ENCOUNTER — Telehealth: Payer: Self-pay | Admitting: Dietician

## 2017-04-03 NOTE — Telephone Encounter (Signed)
Got another call from [name] about her test strips. [facility] called [facility] they are ready for pick up and will cost her 41$ for 300 strips(90 day supply) , the lancets were about 3$. I called [facility] and explained this and also that if they want a 30 day supply of 100 test strips the pharmacy will sell that quantity to them as well.

## 2017-04-05 ENCOUNTER — Other Ambulatory Visit: Payer: Self-pay | Admitting: Internal Medicine

## 2017-04-05 DIAGNOSIS — I10 Essential (primary) hypertension: Secondary | ICD-10-CM

## 2017-04-07 NOTE — Addendum Note (Signed)
Addended by: Hulan Fray on: 04/07/2017 08:43 PM   Modules accepted: Orders

## 2017-04-10 NOTE — Addendum Note (Signed)
Addended by: Truddie Crumble on: 04/10/2017 09:25 AM   Modules accepted: Orders

## 2017-04-30 ENCOUNTER — Encounter (INDEPENDENT_AMBULATORY_CARE_PROVIDER_SITE_OTHER): Payer: Self-pay

## 2017-04-30 ENCOUNTER — Ambulatory Visit (INDEPENDENT_AMBULATORY_CARE_PROVIDER_SITE_OTHER): Payer: Medicare Other | Admitting: Internal Medicine

## 2017-04-30 ENCOUNTER — Encounter: Payer: Self-pay | Admitting: Internal Medicine

## 2017-04-30 ENCOUNTER — Other Ambulatory Visit: Payer: Self-pay

## 2017-04-30 VITALS — BP 186/95 | HR 82 | Temp 98.0°F | Ht 67.0 in | Wt 183.7 lb

## 2017-04-30 DIAGNOSIS — I10 Essential (primary) hypertension: Secondary | ICD-10-CM | POA: Diagnosis not present

## 2017-04-30 DIAGNOSIS — N182 Chronic kidney disease, stage 2 (mild): Secondary | ICD-10-CM | POA: Diagnosis not present

## 2017-04-30 DIAGNOSIS — E11622 Type 2 diabetes mellitus with other skin ulcer: Secondary | ICD-10-CM

## 2017-04-30 DIAGNOSIS — N289 Disorder of kidney and ureter, unspecified: Secondary | ICD-10-CM

## 2017-04-30 DIAGNOSIS — Z952 Presence of prosthetic heart valve: Secondary | ICD-10-CM

## 2017-04-30 DIAGNOSIS — Z794 Long term (current) use of insulin: Secondary | ICD-10-CM

## 2017-04-30 DIAGNOSIS — E1129 Type 2 diabetes mellitus with other diabetic kidney complication: Secondary | ICD-10-CM | POA: Diagnosis not present

## 2017-04-30 DIAGNOSIS — E785 Hyperlipidemia, unspecified: Secondary | ICD-10-CM | POA: Diagnosis not present

## 2017-04-30 DIAGNOSIS — I350 Nonrheumatic aortic (valve) stenosis: Secondary | ICD-10-CM

## 2017-04-30 DIAGNOSIS — I700 Atherosclerosis of aorta: Secondary | ICD-10-CM | POA: Diagnosis not present

## 2017-04-30 DIAGNOSIS — R152 Fecal urgency: Secondary | ICD-10-CM

## 2017-04-30 DIAGNOSIS — L89322 Pressure ulcer of left buttock, stage 2: Secondary | ICD-10-CM

## 2017-04-30 DIAGNOSIS — R42 Dizziness and giddiness: Secondary | ICD-10-CM

## 2017-04-30 DIAGNOSIS — R269 Unspecified abnormalities of gait and mobility: Secondary | ICD-10-CM

## 2017-04-30 DIAGNOSIS — Z79899 Other long term (current) drug therapy: Secondary | ICD-10-CM

## 2017-04-30 DIAGNOSIS — E1122 Type 2 diabetes mellitus with diabetic chronic kidney disease: Secondary | ICD-10-CM

## 2017-04-30 DIAGNOSIS — R2681 Unsteadiness on feet: Secondary | ICD-10-CM

## 2017-04-30 DIAGNOSIS — R600 Localized edema: Secondary | ICD-10-CM

## 2017-04-30 DIAGNOSIS — E538 Deficiency of other specified B group vitamins: Secondary | ICD-10-CM

## 2017-04-30 DIAGNOSIS — R159 Full incontinence of feces: Secondary | ICD-10-CM

## 2017-04-30 DIAGNOSIS — Z8679 Personal history of other diseases of the circulatory system: Secondary | ICD-10-CM | POA: Diagnosis not present

## 2017-04-30 DIAGNOSIS — Z7982 Long term (current) use of aspirin: Secondary | ICD-10-CM | POA: Diagnosis not present

## 2017-04-30 DIAGNOSIS — I2510 Atherosclerotic heart disease of native coronary artery without angina pectoris: Secondary | ICD-10-CM

## 2017-04-30 DIAGNOSIS — R011 Cardiac murmur, unspecified: Secondary | ICD-10-CM

## 2017-04-30 DIAGNOSIS — Z0000 Encounter for general adult medical examination without abnormal findings: Secondary | ICD-10-CM

## 2017-04-30 DIAGNOSIS — K5900 Constipation, unspecified: Secondary | ICD-10-CM

## 2017-04-30 DIAGNOSIS — R197 Diarrhea, unspecified: Secondary | ICD-10-CM

## 2017-04-30 LAB — GLUCOSE, CAPILLARY: Glucose-Capillary: 97 mg/dL (ref 65–99)

## 2017-04-30 LAB — POCT GLYCOSYLATED HEMOGLOBIN (HGB A1C): Hemoglobin A1C: 6.2

## 2017-04-30 MED ORDER — CYANOCOBALAMIN 1000 MCG/ML IJ SOLN
1000.0000 ug | Freq: Once | INTRAMUSCULAR | Status: AC
Start: 2017-04-30 — End: 2017-04-30
  Administered 2017-04-30: 1000 ug via INTRAMUSCULAR

## 2017-04-30 NOTE — Progress Notes (Signed)
62

## 2017-04-30 NOTE — Patient Instructions (Addendum)
1. I am ordering home health - leg swelling, L buttock sore, therapy 2. I am looking into cost of ECHO 3. [facility] will not be able to fill your testing supplies - they were [facility] 4. Please come back later to get abdominal film 5. See me in 3 months

## 2017-05-01 DIAGNOSIS — E538 Deficiency of other specified B group vitamins: Secondary | ICD-10-CM | POA: Insufficient documentation

## 2017-05-01 LAB — BMP8+ANION GAP
Anion Gap: 15 mmol/L (ref 10.0–18.0)
BUN/Creatinine Ratio: 16 (ref 12–28)
BUN: 18 mg/dL (ref 8–27)
CO2: 25 mmol/L (ref 20–29)
Calcium: 9.1 mg/dL (ref 8.7–10.3)
Chloride: 101 mmol/L (ref 96–106)
Creatinine, Ser: 1.1 mg/dL — ABNORMAL HIGH (ref 0.57–1.00)
GFR calc Af Amer: 58 mL/min/{1.73_m2} — ABNORMAL LOW (ref >59)
GFR calc non Af Amer: 50 mL/min/{1.73_m2} — ABNORMAL LOW (ref >59)
Glucose: 86 mg/dL (ref 65–99)
Potassium: 4.4 mmol/L (ref 3.5–5.2)
Sodium: 141 mmol/L (ref 134–144)

## 2017-05-01 MED ORDER — DILTIAZEM HCL ER COATED BEADS 120 MG PO CP24: 120.0000 mg | ORAL_CAPSULE | Freq: Every day | ORAL | 0 refills | Status: DC

## 2017-05-01 NOTE — Assessment & Plan Note (Signed)
Problem is chronic and uncontrolled. Dilt 240, lisinopril 40, toprol 25. During Dr Julianne Rice med rec last month, she had not filled the dilt. Per Epic refill, has gotten all three in Jan or Feb for 90 days. BP not at goal even now that has all three.  PLAN : lisinopril 40 Toprol 25 Dilt 240 + new 120 for total 360 QD   BP Readings from Last 3 Encounters:  04/30/17 (!) 186/95  01/29/17 (!) 149/71  07/10/16 (!) 158/69

## 2017-05-01 NOTE — Progress Notes (Signed)
   Subjective:    Patient ID: [name], female    DOB: January 21, 1946, 72 y.o.   MRN: [id number]  HPI  [name] is here for DM F/U. Please see the A&P for the status of the pt's chronic medical problems.  ROS : per ROS section and in problem oriented charting. All other systems are negative.  PMHx, Soc hx, and / or Fam hx : She has 2 daughters.  The daughter who came with her today works in the neuro ICU here at [employer].  Her other daughter often comes with her when she uses [employer] for transportation.  Review of Systems  Constitutional: Positive for fatigue. Negative for appetite change.  Respiratory: Negative for shortness of breath.   Cardiovascular: Positive for chest pain and leg swelling.  Gastrointestinal: Positive for constipation and diarrhea.  Genitourinary: Positive for urgency. Negative for difficulty urinating.  Neurological: Positive for light-headedness.       Objective:   Physical Exam  Constitutional: She appears well-developed and well-nourished. No distress.  HENT:  Head: Normocephalic and atraumatic.  Right Ear: External ear normal.  Left Ear: External ear normal.  Nose: Nose normal.  Eyes: Conjunctivae and EOM are normal. Right eye exhibits no discharge. Left eye exhibits no discharge. No scleral icterus.  Cardiovascular: Normal rate and regular rhythm.  Murmur heard. Pulmonary/Chest: Effort normal and breath sounds normal. No respiratory distress.  Musculoskeletal: Normal range of motion. She exhibits edema. She exhibits no tenderness or deformity.  Neurological: She is alert.  Skin: Skin is warm and dry. She is not diaphoretic.  There is a 1 cm stage II sacral decub on the left buttock just distal to the crease.  It does not touch the right buttock cheek.  It is not tender  Psychiatric: She has a normal mood and affect. Her behavior is normal. Judgment and thought content normal.      Assessment & Plan:

## 2017-05-01 NOTE — Assessment & Plan Note (Addendum)
This problem is chronic and stable. A1C trend is 7.1 - 6.9 - 6.2 today. Metformin 1000 BID, novolog 10 TID, and levemir 24 QD. Has meter but no test strips or lancets. Denies symptomatic lows. Encouraged eye exam but can't afford co-pay. Will try to get with retinal camera once up and running.  She is on gaba 300 TID. The 1st mention I can find in EMR is from 11/17/2006 : Dr Sharlet Salina : "She has had no recent L breast pain as noted in previous visits.  Is not taking the flexeril or neurontin." This was after her breast cancer dx. Foot exams have been nl. Does not describe current or h/o neuropathy. Will discuss gaba taper to off next appt.   PLAN:  Cont current meds Gaba taper to off next appt

## 2017-05-01 NOTE — Assessment & Plan Note (Signed)
Chronic and stable. Did not get compression stockings. HH referral for wrapping.  PLAN : [place] referral

## 2017-05-01 NOTE — Assessment & Plan Note (Addendum)
Chronic and stable. Not taking PO Vit B12. She got a second B12 IM today and I encouraged her to take 1000 PO QD.  PLAN : IM B12 followed by PO QD

## 2017-05-01 NOTE — Assessment & Plan Note (Addendum)
She needs MMG yrly 2/2 breast cancer. I encouraged MMG.   Stage 2 sacral decub. Reform wound care.  Unsteady gait. Wobbly. No falls. Uses walker. Couple of steps without handrail into apartment. Never goes on them without assistance. HH PT.  Alternating constipation and diarrhea / incontinence. ABD xray to assess stool burden.

## 2017-05-01 NOTE — Assessment & Plan Note (Signed)
This problem is chronic and stable.  She is on a beta-blocker and an aspirin.  She is on Lipitor 40 mg, a high intensity statin.

## 2017-05-01 NOTE — Assessment & Plan Note (Signed)
Problem is chronic anf stable. Found incidentally on CT 2006. RF moddication / statin / ASA / DM control.  PLAN:  Cont current meds

## 2017-05-01 NOTE — Assessment & Plan Note (Addendum)
I reviewed the recommendations for echo surveillance after a prosthetic aortic valve and she does need annual TTE's.  Her most recent was in 2017 which showed a normally functioning valve.  We discussed TTE today but she wanted to know what the co-pay before agreeing to get it done.  I sent a message to our front staff to see if they could help with the co-pay.  PLAN : Determine co-pay for echo  F/u- THe patient must have had to met her deductible for 2019 with is $185 which is her annual deductible for 2019. Notified the patient that she would have to check with her insurance to see if that has been met.

## 2017-05-01 NOTE — Assessment & Plan Note (Signed)
Problem is chronic and stable. Prescribed statin, lipitor 40 high intensity, but Dr Julianne Rice med rec last appt indicated not taking. Epic RF hx also not taking.   PLAN : F/U next appt HH med rec

## 2017-05-04 ENCOUNTER — Encounter: Payer: Self-pay | Admitting: Internal Medicine

## 2017-05-19 ENCOUNTER — Telehealth: Payer: Self-pay | Admitting: Internal Medicine

## 2017-05-28 ENCOUNTER — Telehealth: Payer: Self-pay | Admitting: Internal Medicine

## 2017-05-28 NOTE — Telephone Encounter (Signed)
Request for VO from Rosalene Billings. [facility]) for [facility], [place], Medication Assistance and Disease Education.

## 2017-05-28 NOTE — Telephone Encounter (Signed)
Alexander Mt, RN at [facility] - verbal order given for "Home health, wound care, disease education and med assistance or 1 week x 1 then 2 weeks x 2 then 1 week x 3". VO given per Dr Lynnae January.

## 2017-05-28 NOTE — Telephone Encounter (Signed)
VO OK 

## 2017-06-05 ENCOUNTER — Inpatient Hospital Stay (HOSPITAL_COMMUNITY)
Admission: EM | Admit: 2017-06-05 | Discharge: 2017-06-08 | DRG: 305 | Disposition: A | Discharge status: Home Health | Payer: Medicare Other | Attending: Student in an Organized Health Care Education/Training Program | Admitting: Student in an Organized Health Care Education/Training Program

## 2017-06-05 ENCOUNTER — Encounter (HOSPITAL_COMMUNITY): Payer: Self-pay

## 2017-06-05 DIAGNOSIS — I350 Nonrheumatic aortic (valve) stenosis: Secondary | ICD-10-CM | POA: Diagnosis present

## 2017-06-05 DIAGNOSIS — Z90710 Acquired absence of both cervix and uterus: Secondary | ICD-10-CM

## 2017-06-05 DIAGNOSIS — I10 Essential (primary) hypertension: Secondary | ICD-10-CM | POA: Diagnosis not present

## 2017-06-05 DIAGNOSIS — Z8673 Personal history of transient ischemic attack (TIA), and cerebral infarction without residual deficits: Secondary | ICD-10-CM | POA: Diagnosis not present

## 2017-06-05 DIAGNOSIS — E785 Hyperlipidemia, unspecified: Secondary | ICD-10-CM | POA: Diagnosis present

## 2017-06-05 DIAGNOSIS — I160 Hypertensive urgency: Secondary | ICD-10-CM | POA: Diagnosis not present

## 2017-06-05 DIAGNOSIS — Z833 Family history of diabetes mellitus: Secondary | ICD-10-CM

## 2017-06-05 DIAGNOSIS — I69354 Hemiplegia and hemiparesis following cerebral infarction affecting left non-dominant side: Secondary | ICD-10-CM | POA: Diagnosis not present

## 2017-06-05 DIAGNOSIS — I69351 Hemiplegia and hemiparesis following cerebral infarction affecting right dominant side: Secondary | ICD-10-CM

## 2017-06-05 DIAGNOSIS — Z794 Long term (current) use of insulin: Secondary | ICD-10-CM

## 2017-06-05 DIAGNOSIS — E1151 Type 2 diabetes mellitus with diabetic peripheral angiopathy without gangrene: Secondary | ICD-10-CM | POA: Diagnosis not present

## 2017-06-05 DIAGNOSIS — R29898 Other symptoms and signs involving the musculoskeletal system: Secondary | ICD-10-CM

## 2017-06-05 DIAGNOSIS — R7989 Other specified abnormal findings of blood chemistry: Secondary | ICD-10-CM | POA: Diagnosis present

## 2017-06-05 DIAGNOSIS — Z818 Family history of other mental and behavioral disorders: Secondary | ICD-10-CM

## 2017-06-05 DIAGNOSIS — I700 Atherosclerosis of aorta: Secondary | ICD-10-CM

## 2017-06-05 DIAGNOSIS — F329 Major depressive disorder, single episode, unspecified: Secondary | ICD-10-CM | POA: Diagnosis present

## 2017-06-05 DIAGNOSIS — R001 Bradycardia, unspecified: Secondary | ICD-10-CM | POA: Diagnosis not present

## 2017-06-05 DIAGNOSIS — Z951 Presence of aortocoronary bypass graft: Secondary | ICD-10-CM

## 2017-06-05 DIAGNOSIS — I878 Other specified disorders of veins: Secondary | ICD-10-CM | POA: Diagnosis present

## 2017-06-05 DIAGNOSIS — R531 Weakness: Secondary | ICD-10-CM | POA: Diagnosis not present

## 2017-06-05 DIAGNOSIS — M810 Age-related osteoporosis without current pathological fracture: Secondary | ICD-10-CM | POA: Diagnosis present

## 2017-06-05 DIAGNOSIS — Z841 Family history of disorders of kidney and ureter: Secondary | ICD-10-CM

## 2017-06-05 DIAGNOSIS — E1122 Type 2 diabetes mellitus with diabetic chronic kidney disease: Secondary | ICD-10-CM | POA: Diagnosis not present

## 2017-06-05 DIAGNOSIS — Z79899 Other long term (current) drug therapy: Secondary | ICD-10-CM

## 2017-06-05 DIAGNOSIS — I129 Hypertensive chronic kidney disease with stage 1 through stage 4 chronic kidney disease, or unspecified chronic kidney disease: Secondary | ICD-10-CM | POA: Diagnosis present

## 2017-06-05 DIAGNOSIS — Z8249 Family history of ischemic heart disease and other diseases of the circulatory system: Secondary | ICD-10-CM

## 2017-06-05 DIAGNOSIS — I2510 Atherosclerotic heart disease of native coronary artery without angina pectoris: Secondary | ICD-10-CM | POA: Diagnosis present

## 2017-06-05 DIAGNOSIS — Z9010 Acquired absence of unspecified breast and nipple: Secondary | ICD-10-CM

## 2017-06-05 DIAGNOSIS — N183 Chronic kidney disease, stage 3 (moderate): Secondary | ICD-10-CM | POA: Diagnosis not present

## 2017-06-05 DIAGNOSIS — Z7982 Long term (current) use of aspirin: Secondary | ICD-10-CM

## 2017-06-05 DIAGNOSIS — I447 Left bundle-branch block, unspecified: Secondary | ICD-10-CM | POA: Diagnosis present

## 2017-06-05 DIAGNOSIS — Z953 Presence of xenogenic heart valve: Secondary | ICD-10-CM

## 2017-06-05 DIAGNOSIS — Z853 Personal history of malignant neoplasm of breast: Secondary | ICD-10-CM

## 2017-06-05 DIAGNOSIS — M1990 Unspecified osteoarthritis, unspecified site: Secondary | ICD-10-CM | POA: Diagnosis present

## 2017-06-05 NOTE — ED Triage Notes (Signed)
Pt states the home health nurse check her BP and told her it was 546 systolic and to come to the ER, pt then took her BP medication, denies pain

## 2017-06-06 ENCOUNTER — Observation Stay (HOSPITAL_COMMUNITY): Payer: Medicare Other

## 2017-06-06 ENCOUNTER — Emergency Department (HOSPITAL_COMMUNITY): Payer: Medicare Other

## 2017-06-06 ENCOUNTER — Other Ambulatory Visit: Payer: Self-pay

## 2017-06-06 ENCOUNTER — Telehealth: Payer: Self-pay | Admitting: Internal Medicine

## 2017-06-06 DIAGNOSIS — I160 Hypertensive urgency: Secondary | ICD-10-CM | POA: Diagnosis not present

## 2017-06-06 DIAGNOSIS — Z79899 Other long term (current) drug therapy: Secondary | ICD-10-CM

## 2017-06-06 DIAGNOSIS — E1122 Type 2 diabetes mellitus with diabetic chronic kidney disease: Secondary | ICD-10-CM | POA: Diagnosis not present

## 2017-06-06 DIAGNOSIS — I447 Left bundle-branch block, unspecified: Secondary | ICD-10-CM | POA: Diagnosis not present

## 2017-06-06 DIAGNOSIS — Z954 Presence of other heart-valve replacement: Secondary | ICD-10-CM

## 2017-06-06 DIAGNOSIS — N183 Chronic kidney disease, stage 3 (moderate): Secondary | ICD-10-CM | POA: Diagnosis not present

## 2017-06-06 DIAGNOSIS — Z8249 Family history of ischemic heart disease and other diseases of the circulatory system: Secondary | ICD-10-CM

## 2017-06-06 DIAGNOSIS — I878 Other specified disorders of veins: Secondary | ICD-10-CM

## 2017-06-06 DIAGNOSIS — E875 Hyperkalemia: Secondary | ICD-10-CM

## 2017-06-06 DIAGNOSIS — I350 Nonrheumatic aortic (valve) stenosis: Secondary | ICD-10-CM | POA: Diagnosis not present

## 2017-06-06 DIAGNOSIS — I639 Cerebral infarction, unspecified: Secondary | ICD-10-CM | POA: Diagnosis not present

## 2017-06-06 DIAGNOSIS — R011 Cardiac murmur, unspecified: Secondary | ICD-10-CM

## 2017-06-06 DIAGNOSIS — Z951 Presence of aortocoronary bypass graft: Secondary | ICD-10-CM | POA: Diagnosis not present

## 2017-06-06 DIAGNOSIS — I2510 Atherosclerotic heart disease of native coronary artery without angina pectoris: Secondary | ICD-10-CM | POA: Diagnosis not present

## 2017-06-06 DIAGNOSIS — Z841 Family history of disorders of kidney and ureter: Secondary | ICD-10-CM

## 2017-06-06 DIAGNOSIS — Z833 Family history of diabetes mellitus: Secondary | ICD-10-CM

## 2017-06-06 DIAGNOSIS — Z794 Long term (current) use of insulin: Secondary | ICD-10-CM

## 2017-06-06 DIAGNOSIS — I10 Essential (primary) hypertension: Secondary | ICD-10-CM | POA: Diagnosis not present

## 2017-06-06 DIAGNOSIS — Z7982 Long term (current) use of aspirin: Secondary | ICD-10-CM

## 2017-06-06 DIAGNOSIS — I129 Hypertensive chronic kidney disease with stage 1 through stage 4 chronic kidney disease, or unspecified chronic kidney disease: Secondary | ICD-10-CM | POA: Diagnosis not present

## 2017-06-06 DIAGNOSIS — Z8673 Personal history of transient ischemic attack (TIA), and cerebral infarction without residual deficits: Secondary | ICD-10-CM

## 2017-06-06 LAB — BASIC METABOLIC PANEL
Anion gap: 9 (ref 5–15)
BUN: 15 mg/dL (ref 6–20)
CO2: 27 mmol/L (ref 22–32)
Calcium: 9 mg/dL (ref 8.9–10.3)
Chloride: 103 mmol/L (ref 101–111)
Creatinine, Ser: 1.23 mg/dL — ABNORMAL HIGH (ref 0.44–1.00)
GFR calc Af Amer: 49 mL/min — ABNORMAL LOW (ref >60)
GFR calc non Af Amer: 42 mL/min — ABNORMAL LOW (ref >60)
Glucose, Bld: 175 mg/dL — ABNORMAL HIGH (ref 65–99)
Potassium: 3.8 mmol/L (ref 3.5–5.1)
Sodium: 139 mmol/L (ref 135–145)

## 2017-06-06 LAB — CBC WITH DIFFERENTIAL/PLATELET
Basophils Absolute: 0 10*3/uL (ref 0.0–0.1)
Basophils Relative: 0 %
Eosinophils Absolute: 0.1 10*3/uL (ref 0.0–0.7)
Eosinophils Relative: 2 %
HCT: 38.9 % (ref 36.0–46.0)
Hemoglobin: 12.7 g/dL (ref 12.0–15.0)
Lymphocytes Relative: 26 %
Lymphs Abs: 1.9 10*3/uL (ref 0.7–4.0)
MCH: 30.8 pg (ref 26.0–34.0)
MCHC: 32.6 g/dL (ref 30.0–36.0)
MCV: 94.2 fL (ref 78.0–100.0)
Monocytes Absolute: 0.5 10*3/uL (ref 0.1–1.0)
Monocytes Relative: 7 %
Neutro Abs: 4.9 10*3/uL (ref 1.7–7.7)
Neutrophils Relative %: 65 %
Platelets: 255 10*3/uL (ref 150–400)
RBC: 4.13 MIL/uL (ref 3.87–5.11)
RDW: 13.1 % (ref 11.5–15.5)
WBC: 7.5 10*3/uL (ref 4.0–10.5)

## 2017-06-06 LAB — CBG MONITORING, ED: Glucose-Capillary: 142 mg/dL — ABNORMAL HIGH (ref 65–99)

## 2017-06-06 LAB — GLUCOSE, CAPILLARY
Glucose-Capillary: 147 mg/dL — ABNORMAL HIGH (ref 65–99)
Glucose-Capillary: 162 mg/dL — ABNORMAL HIGH (ref 65–99)
Glucose-Capillary: 142 mg/dL — ABNORMAL HIGH (ref 65–99)
Glucose-Capillary: 165 mg/dL — ABNORMAL HIGH (ref 65–99)

## 2017-06-06 LAB — I-STAT TROPONIN, ED: Troponin i, poc: 0.02 ng/mL (ref 0.00–0.08)

## 2017-06-06 LAB — BRAIN NATRIURETIC PEPTIDE: B Natriuretic Peptide: 438.7 pg/mL — ABNORMAL HIGH (ref 0.0–100.0)

## 2017-06-06 MED ORDER — ACETAMINOPHEN 650 MG RE SUPP: 650.0000 mg | Freq: Four times a day (QID) | RECTAL | Status: DC | PRN

## 2017-06-06 MED ORDER — PAROXETINE HCL 20 MG PO TABS
20.0000 mg | ORAL_TABLET | Freq: Every day | ORAL | Status: DC
  Administered 2017-06-06 – 2017-06-08 (×3): 20 mg via ORAL
  Filled 2017-06-06 (×3): qty 1

## 2017-06-06 MED ORDER — INSULIN DETEMIR 100 UNIT/ML ~~LOC~~ SOLN
15.0000 [IU] | Freq: Every day | SUBCUTANEOUS | Status: DC
  Administered 2017-06-06 – 2017-06-07 (×2): 15 [IU] via SUBCUTANEOUS
  Filled 2017-06-06 (×3): qty 0.15

## 2017-06-06 MED ORDER — DILTIAZEM HCL ER COATED BEADS 180 MG PO CP24
360.0000 mg | ORAL_CAPSULE | Freq: Every day | ORAL | Status: DC
  Administered 2017-06-06 – 2017-06-08 (×3): 360 mg via ORAL
  Filled 2017-06-06 (×3): qty 2

## 2017-06-06 MED ORDER — HYDRALAZINE HCL 20 MG/ML IJ SOLN
10.0000 mg | Freq: Once | INTRAMUSCULAR | Status: AC
  Administered 2017-06-06: 10 mg via INTRAVENOUS
  Filled 2017-06-06: qty 1

## 2017-06-06 MED ORDER — INSULIN ASPART 100 UNIT/ML ~~LOC~~ SOLN
0.0000 [IU] | Freq: Three times a day (TID) | SUBCUTANEOUS | Status: DC
  Administered 2017-06-06: 2 [IU] via SUBCUTANEOUS
  Administered 2017-06-06 – 2017-06-07 (×4): 3 [IU] via SUBCUTANEOUS
  Administered 2017-06-07: 2 [IU] via SUBCUTANEOUS
  Administered 2017-06-08: 3 [IU] via SUBCUTANEOUS

## 2017-06-06 MED ORDER — ENOXAPARIN SODIUM 40 MG/0.4ML ~~LOC~~ SOLN
40.0000 mg | Freq: Every day | SUBCUTANEOUS | Status: DC
  Administered 2017-06-06 – 2017-06-08 (×3): 40 mg via SUBCUTANEOUS
  Filled 2017-06-06 (×3): qty 0.4

## 2017-06-06 MED ORDER — HYDRALAZINE HCL 25 MG PO TABS
25.0000 mg | ORAL_TABLET | Freq: Three times a day (TID) | ORAL | Status: DC
  Administered 2017-06-06 (×2): 25 mg via ORAL
  Filled 2017-06-06 (×2): qty 1

## 2017-06-06 MED ORDER — ACETAMINOPHEN 325 MG PO TABS
650.0000 mg | ORAL_TABLET | Freq: Four times a day (QID) | ORAL | Status: DC | PRN
  Administered 2017-06-07: 650 mg via ORAL
  Filled 2017-06-06: qty 2

## 2017-06-06 MED ORDER — HYDRALAZINE HCL 20 MG/ML IJ SOLN: 5.0000 mg | Freq: Four times a day (QID) | INTRAMUSCULAR | Status: DC | PRN

## 2017-06-06 MED ORDER — GABAPENTIN 300 MG PO CAPS
300.0000 mg | ORAL_CAPSULE | Freq: Three times a day (TID) | ORAL | Status: DC
  Administered 2017-06-06 – 2017-06-08 (×8): 300 mg via ORAL
  Filled 2017-06-06 (×8): qty 1

## 2017-06-06 MED ORDER — POLYETHYLENE GLYCOL 3350 17 G PO PACK
17.0000 g | PACK | Freq: Every day | ORAL | Status: DC | PRN
  Administered 2017-06-07: 17 g via ORAL
  Filled 2017-06-06: qty 1

## 2017-06-06 MED ORDER — SODIUM CHLORIDE 0.9 % IV BOLUS
500.0000 mL | Freq: Once | INTRAVENOUS | Status: AC
  Administered 2017-06-06: 500 mL via INTRAVENOUS

## 2017-06-06 MED ORDER — METOPROLOL SUCCINATE ER 25 MG PO TB24
25.0000 mg | ORAL_TABLET | Freq: Every day | ORAL | Status: DC
  Administered 2017-06-06 – 2017-06-07 (×2): 25 mg via ORAL
  Filled 2017-06-06 (×2): qty 1

## 2017-06-06 MED ORDER — INSULIN ASPART 100 UNIT/ML ~~LOC~~ SOLN
0.0000 [IU] | Freq: Every day | SUBCUTANEOUS | Status: DC
  Administered 2017-06-07: 2 [IU] via SUBCUTANEOUS

## 2017-06-06 MED ORDER — ASPIRIN EC 81 MG PO TBEC
81.0000 mg | DELAYED_RELEASE_TABLET | Freq: Every day | ORAL | Status: DC
  Administered 2017-06-06 – 2017-06-08 (×3): 81 mg via ORAL
  Filled 2017-06-06 (×3): qty 1

## 2017-06-06 MED ORDER — ATORVASTATIN CALCIUM 40 MG PO TABS
40.0000 mg | ORAL_TABLET | Freq: Every day | ORAL | Status: DC
  Administered 2017-06-06 – 2017-06-08 (×3): 40 mg via ORAL
  Filled 2017-06-06 (×3): qty 1

## 2017-06-06 MED ORDER — LISINOPRIL 40 MG PO TABS
40.0000 mg | ORAL_TABLET | Freq: Every day | ORAL | Status: DC
  Administered 2017-06-06 – 2017-06-08 (×3): 40 mg via ORAL
  Filled 2017-06-06 (×3): qty 1

## 2017-06-06 NOTE — Progress Notes (Signed)
Subjective: Ms. Anand says that her generalized weakness is feeling improved from the time of admission but has not resolved. She denies chest pain, difficulty breathing, nausea or abdominal discomfort. She has had a change in vision for the past 3 weeks described as seeing fine lines that look like hairs in the left eye.   Objective:  Vital signs in last 24 hours: Vitals:   06/06/17 0547 06/06/17 0758 06/06/17 0800 06/06/17 1200  BP: (!) 197/86 (!) 168/85  (!) 158/73  Pulse: 80   70  Resp: $Remo'19 17 16   'bxXoj$ Temp: 98.8 F (37.1 C)     TempSrc: Oral     SpO2: 97%   97%  Weight:      Height: $Remove'5\' 7"'HORYcVI$  (1.702 m)      General: well appearing, no acute distress  Cardiac: RRR, 3/6 systolic murmur loudest over RUSB, 2+ bilateral lower extremity edema  Pulm: no increased work of breathing, lungs clear to auscultation bilateral  GI : abdomen is soft, non tender, non distended   Assessment/Plan:    Hypertensive urgency -MRI showed no acute intracranial abnormality but changes most compatible with chronic small vessel disease in the periventricular white matter and left cerebellum - Blood pressure on admission had a MAP of 128 at 9 pm 4/21, MAP goal is an improvement to 96 by 9 pm this evening  - continue home diltiazem 360 mg daily, toprol 25 mg qd  - restart lisinopril 40 mg qd  - hydralazine PRN for MAP >96   Generalized weakness due to deconditioning and history of CVA  - MRI showed no acute changes, No signs or symptoms of infection, BMP unremarkable, she is on gabapentin but not on other centrally acting medications  - echo ordered to follow up history of aortic stenosis  - PT/OT evaluation and recommendations appreciated   Chronic kidney disease stage 3B  - crt 1.2 on admission overnight, this is worsened from 1.1 at the time of last BMP in march 2019  Diabetes   - levemir 15 units qHS, moderate sliding scale  - home medications include metformin 1000 units BID, novolog 10 units TID,  levemir 24 units qHS   History of coronary artery disease status post CABG - No active chest pain or symptoms of ACS - Continue home aspirin, lisinopril, metoprolol, and atorvastatin  Aortic atherosclerosis  - noted on chest xray, no signs of peripheral thromboembolism  - continue risk factor modification   Dispo  Daughter is at bedside and expresses significant concern for the patient being able to care for herself at home.  She has 8 children 1 of whom is a daughter who lives right beside her however they all work and have children and they feel that she is very weak and has difficulty managing her medications on her own and needs additional help at home.  Dr. Lynnae January had arranged for home health nursing at the time of last office visit. The daughter expressed that they want someone in the home for extensive hours through the day. I am concerned that this would not be possible without significant out of pocket cost due to medicare without medicaid status. I will consult social work to see if there is anything else that we can offer.  Ms. Westry says that she does not have difficulty with affording medication but does have difficulty with affording copays such as office visits.  - PT and OT consult  - SW consult for home health needs   Dispo:  Anticipated discharge in approximately 1-2 day(s).   Ledell Noss, MD 06/06/2017, 1:54 PM Pager: [phone]

## 2017-06-06 NOTE — H&P (Signed)
Date: 06/06/2017               Patient Name:  [name] MRN: [id number]  DOB: [birth year] Age / Sex: 72 y.o., female   PCP: [name], MD         Medical Service: Internal Medicine Teaching Service         Attending Physician: Dr. Evette Doffing, Mallie Mussel, *    First Contact: Miachel Roux, MS4 Pager: [phone]  Second Contact: Dr. Ledell Noss Pager: [phone]       After Hours (After 5p/  First Contact Pager: [phone]  weekends / holidays): Second Contact Pager: [phone]   Chief Complaint: Weakness  History of Present Illness:  [name] 72 yo with PMH of CAD s/p CABG in 2006, T2DM, HLD, HTN, aortic stenosis s/p porcine valve in 2006, prior CVA, and CKD stage 3 who presents for evaluation of weakness and jitteriness. History obtained directly from the patient and daughter who is bedside. Limited 2/2 patient not remembering the events leading up to admission. Patient states that this afternoon after taking lunchtime insulin and eating egg-salad sandwich she noticed ongoing "jitteriness". At this time she felt that her hands were shaky and weak. She had felt this way before, when she took too much insulin and didn't eat enough. Patient did not check BG but called daughter, who lives next door, to inform her of the symptoms. The patient's home nurse came 1 hour later for a regularly scheduled visit. The patient's BP was noted to be elevated with SBP >200 at that time, however the patient had not taken her medications that day. The daughter called the on-call physician with the internal medicine clinic who suggested the patient take her BP medication. After taking the BP medication, the patient continued to feel jittery and she started to feel more weak in her legs. Since a stroke a few years ago the patient has experienced residual weakness in lower extremities (L>R) and requires a walker. She shuffles her feet when she walks as a result. Today the weakness was so bad that  she couldn't lift her feet to shuffle them. The daughter observed the patient to be standing, holding on to the walker, and refusing to take a step because she felt like she was going to fall.   Patient denies fevers, headache, chest pain, blurred vision, shortness of breath, abdominal pain, diaphoresis, nausea/vomiting, worsening of chronic lower extremity swelling, orthopnea, and change in bowel/bladder habits prior to coming to ED.   Upon arrival to the ED patient the patient was afebrile, nontachycardic, hypertensive to 200s-210s/90s-/100s, and saturating >95% on room air. CBC with WBC =7.5, Hgb =12.7, platelets =255. BMP remarkable for Cr of 1.23, historical baseline of 1.0-1.1. BNP elevated to 438.7 and iSTAT troponin = 0.02. EKG with LBBB, unchanged from EKG in 2013, and no new signs of ischemia. CXR without acute process. Patient was given 500 cc bolus and IMTS was called for admission.   Meds:  No outpatient medications have been marked as taking for the 06/05/17 encounter [facility] Encounter).   Allergies: Allergies as of 06/05/2017  . (No Known Allergies)   Past Medical History: Past Medical History:  Diagnosis Date  . Adenocarcinoma of breast ([place]) 1996   Completed tamoxifen and had mastectomy.  . Aortic stenosis, severe    s/p aortic valve replacement with porcine valve 06/2004.  ECHO 2010 EF 87%, LVH, diastolic dysfxn, Bioprostetic aoritc valve, mild AS. ECHO 2013 EF 60%, Nl aortic  artificial valve, dynamic obstruction in the outflow tract   Class IIb rec for annual TTE after 5 yrs. She had a TTE 2013    . Arthritis   . CAD (coronary artery disease) 2006   s/p CABG (5/06) w/ saphenous vein to RCA at time of AVR  . CVA (cerebral infarction) 2006   Post-op from AVR. Presumed embolic in nature. Carotid stenosis of R 60-79%. Repeat dopplers 4/10 no R stenosis and L stenosis of 1-29%.  . Depression    Controlled on Paxil  . Diabetes mellitus 1992   Dx 04/25/1990. Now insulin  dependent, started 2008. On ACEI.   . Diverticulosis 2001  . Hyperlipidemia    Mgmt with a statin  . Hypertension    Requires 4 drug tx  . Osteoporosis 2006   DEXA 10/06 : L femur T -2.8, R -2.7. Lumbar T -2.4. On bisphosphonates and  Calcium / Vit D.  . Refusal of blood transfusions as patient is Jehovah's Witness    Past Surgical History: Past Surgical History:  Procedure Laterality Date  . ABDOMINAL HYSTERECTOMY  1987   for fibroids  . AORTIC VALVE REPLACEMENT  2006  . CHOLECYSTECTOMY    . CORONARY ARTERY BYPASS GRAFT  2006   Saphenous vein to RCA at time of AVR. Course complicated by acute respiratory failure, post-op PTX, ARI, ileus, CVA  . MASTECTOMY  1995   L for adenocarcinoma   Family History:  Family History  Problem Relation Age of Onset  . Diabetes Mother   . Hypertension Mother   . Alzheimer's disease Mother   . Heart disease Father 34       AMI at age 84 and 69  . Mental illness Sister   . Heart disease Sister 59       AMI  . Kidney disease Sister    Social History:  Patient lives alone and requires assists with most ADL's including showering and cooking. Patient has occupational therapy 2x a week and physical therapy 2x a week. Patient denies tobacco use, drinks a cooler only on the first of the month when she gets her disability check, and denies illicit drug use.   Review of Systems: A complete ROS was negative except as per HPI.   Physical Exam: Blood pressure (!) 179/84, pulse 70, temperature 98.2 F (36.8 C), temperature source Oral, resp. rate (!) 21, SpO2 98 %.  Physical Exam  Constitutional: She is oriented to person, place, and time. She appears well-developed and well-nourished. No distress.  HENT:  Mouth/Throat: Oropharynx is clear and moist. No oropharyngeal exudate.  Eyes: Pupils are equal, round, and reactive to light. Conjunctivae and EOM are normal.  Neck: No JVD present.  Cardiovascular: Normal rate, regular rhythm and intact distal  pulses. Exam reveals no friction rub.  Murmur (Systolic murmur at LUSB) heard. Respiratory: Effort normal. No respiratory distress. She has no wheezes. She has no rales.  GI: Soft. She exhibits no distension. There is no tenderness. There is no rebound.  Musculoskeletal: She exhibits edema (2+ pitting edema to knees bilaterally). She exhibits no tenderness.  Neurological: She is alert and oriented to person, place, and time.  Face strength and sensation intact bilaterally. Tongue midline. 5/5 bicep, tricep, and grip strength bilaterally. 3/5 on LLE hip flexion, 4-/5 RLE hip flexion. Gross sensation to light touch of upper and lower extremities intact bilaterally. RAM intact bilaterally.  Skin: She is not diaphoretic.  Skin warm and dry. Dry skin with hyperpigmentation over distal bilateral  lower extremities.    EKG: personally reviewed my interpretation is sinus rhythm with stable left bundle branch block previously seen on EKG in 2014.   CXR: personally reviewed my interpretation is no pleural effusions, vascular congestion, interstitial edema, or opacities to suggest acute pulmonary process. Post surgical changes in sternum and L chest.  Assessment & Plan by Problem: Active Problems:   Hypertensive urgency  [name] 72 yo with PMH of CAD s/p CABG in 2006, T2DM, HLD, HTN, aortic stenosis s/p porcine valve in 2006, prior CVA, and CKD stage 3 who presents for evaluation of worsening of chronic weakness. She was found to have consistently elevated BP in ED and no focal abnormalities  Hypertensive urgency: Patient's BP elevated consistently around 200/100 in ED. Patient did recall which medications she took the day leading up to admission but thinks she took lisinopril or metoprolol around 5pm the day prior to presenting. Patient has profound weakness of bilateral lower extremities (L>R) at baseline. She reports worsening of this bilateral weakness leading up to admission, but no focal  abnormalities noted on exam to suggest stroke as etiology of her symptoms or warrant further head imaging. Patient has mildly elevated BNP but no chest pain and evidence of volume overload on exam, as her bilateral LE edema is more consistent with venous stasis rather than acute CHF. Will obtain echocardiography to further evaluate for heart failure. Patient has mild change in Cr from 04/2017 labs, but not clear if this represents acute kidney injury or natural variation. Will continue to monitor for signs/symptoms of end organ damage during treatment of hypertension. Patient usually takes diltiazem 360 mg daily, metoprolol 25 mg BID and lisinopril 40 mg daily at home. Will hold lisinopril for concern of AKI and add hydralazine to inpatient regimen. Given absence of focal defect on exam and significantly elevated BP, suspect mild unmasking of previous stroke deficits related to ongoing hypertension. Will have PT/OT evaluate patient on admission for their recommendations and monitor symptoms as BP is controlled.  -Admit to telemetry -F/u echocardiography ordered on admission -Home metoprolol 25 mg BID and diltiazem 360 mg daily -Hold home lisinopril, restart if Cr remains stable around baseline -Start hydralazine 25 mg TID -BMP in PM to examine kidney function -Ted hose placement for chronic venous stasis -PT/OT evaluation for weakness  T2DM: Hgb A1C = 6.2% on 04/2017, indicating good control on home regimen of metformin, 10 units novolog TID with meals, and 25 units Levemir qHS.  -CBG monitoring TID with meals -SSI with meals, qHS -Lantus 15 units qHS  Hx of CAD s/p CABG:  No current chest pain or EKG changes to suggest ischemia. Will continue home medication regimen and monitor while inpatient.  -Home aspirin 81 mg, metoprolol 25 mg BID, and atorvastatin 40 mg  FEN/GI: -Heart Healthy/Carb Modified diet  -No IVF, replace electrolytes as needed  VTE Prophylaxis: Lovenox daily Code Status:  Full  Dispo: Admit patient to Observation with expected length of stay less than 2 midnights.  SignedThomasene Ripple, MD 06/06/2017, 4:55 AM  Pager: [phone]

## 2017-06-06 NOTE — Evaluation (Signed)
Occupational Therapy Evaluation Patient Details Name: [name] MRN: [id number] DOB: 03/23/1945 Today's Date: 06/06/2017    History of Present Illness 72 yo with PMH of CAD s/p CABG in 2006, T2DM, HLD, HTN, aortic stenosis s/p porcine valve in 2006, prior CVA, and CKD stage 3 who presents for evaluation of weakness and jitteriness.  She was admitted for hypertensive urgency.    Clinical Impression   PTA, pt was living on her own and able to ambulate with RW in her apartment without assistance. She does require assistance with tub/shower transfers, LB dressing, and IADL tasks and daughter who lives next door assists at home. Pt presents to OT evaluation requiring increased time for processing and moving very slowly. She required mod assist to power-up from bed in preparation for toilet transfer and min assist during ambulation to bathroom. Pt requiring max assist for LB ADL and min assist for UB ADL at this time. She does report some dizziness throughout session that did not change with position changes. Feel pt would best benefit from short-term SNF placement for continued rehabilitation prior to D/C home. However, if pt declines, recommend home health OT follow-up and 24 hour assistance. OT will continue to follow while admitted.    Follow Up Recommendations  SNF;Supervision/Assistance - 24 hour(if pt declines, recommend HHOT)    Equipment Recommendations  None recommended by OT(has needs met)    Recommendations for Other Services       Precautions / Restrictions Precautions Precautions: Fall Restrictions Weight Bearing Restrictions: No      Mobility Bed Mobility Overal bed mobility: Needs Assistance Bed Mobility: Supine to Sit;Sit to Supine     Supine to sit: HOB elevated;Min assist Sit to supine: HOB elevated;Mod assist   General bed mobility comments: Assist to bring hips to EOB.   Transfers Overall transfer level: Needs assistance Equipment used: Rolling walker (2  wheeled) Transfers: Sit to/from Stand Sit to Stand: Mod assist         General transfer comment: Mod assist to power up to standing from elevated bed surface.     Balance Overall balance assessment: Needs assistance Sitting-balance support: No upper extremity supported;Feet supported Sitting balance-Leahy Scale: Fair     Standing balance support: Bilateral upper extremity supported;During functional activity Standing balance-Leahy Scale: Poor Standing balance comment: Relies on BUE support.                            ADL either performed or assessed with clinical judgement   ADL Overall ADL's : Needs assistance/impaired Eating/Feeding: Set up;Sitting   Grooming: Min guard;Standing   Upper Body Bathing: Min guard;Sitting   Lower Body Bathing: Maximal assistance;Sit to/from stand   Upper Body Dressing : Min guard;Sitting   Lower Body Dressing: Maximal assistance;Sit to/from stand   Toilet Transfer: Moderate assistance;Ambulation;RW Toilet Transfer Details (indicate cue type and reason): Mod assist to power up with min assist once ambulating.  Toileting- Clothing Manipulation and Hygiene: Minimal assistance Toileting - Clothing Manipulation Details (indicate cue type and reason): Mod assist for sit<>stand     Functional mobility during ADLs: Minimal assistance;Rolling walker(mod assist to power up; min once ambulating) General ADL Comments: Pt limited by decreased activity tolerance for ADL and slight dizziness.      Vision Patient Visual Report: No change from baseline Vision Assessment?: No apparent visual deficits     Perception     Praxis      Pertinent Vitals/Pain Pain Assessment:  No/denies pain Faces Pain Scale: No hurt Pain Location: reported no pain Pain Descriptors / Indicators: Sore Pain Intervention(s): Monitored during session;Repositioned;Limited activity within patient's tolerance     Hand Dominance     Extremity/Trunk Assessment  Upper Extremity Assessment Upper Extremity Assessment: Overall WFL for tasks assessed   Lower Extremity Assessment Lower Extremity Assessment: Overall WFL for tasks assessed(significant B LE edema)   Cervical / Trunk Assessment Cervical / Trunk Assessment: Kyphotic   Communication Communication Communication: No difficulties   Cognition Arousal/Alertness: Awake/alert Behavior During Therapy: WFL for tasks assessed/performed Overall Cognitive Status: Impaired/Different from baseline Area of Impairment: Problem solving                             Problem Solving: Slow processing General Comments: Pt requiring increased time to process information   General Comments       Exercises General Exercises - Lower Extremity Ankle Circles/Pumps: AROM;Both;10 reps;Supine   Shoulder Instructions      Home Living Family/patient expects to be discharged to:: Private residence Living Arrangements: Alone(daughter lives in apartment next door) Available Help at Discharge: Family;Available PRN/intermittently(near 24 hour assist) Type of Home: Apartment Home Access: Level entry     Home Layout: One level     Bathroom Shower/Tub: Teacher, early years/pre: Standard Bathroom Accessibility: Yes   Home Equipment: Walker - 2 wheels;Shower seat;Bedside commode;Wheelchair - manual          Prior Functioning/Environment Level of Independence: Needs assistance  Gait / Transfers Assistance Needed: Ambulates with RW. Uses BSC during the night. Wheelchair for community mobility.  ADL's / Homemaking Assistance Needed: Daughter assists with tub transfers, does cooking for pt and assists with LB dressing tasks.    Comments: Was active with [facility] services (PT/OT/RN) on admission.        OT Problem List: Decreased strength;Decreased range of motion;Decreased activity tolerance;Impaired balance (sitting and/or standing);Decreased safety awareness;Decreased knowledge of use of  DME or AE;Decreased knowledge of precautions;Cardiopulmonary status limiting activity;Decreased cognition      OT Treatment/Interventions: Self-care/ADL training;Therapeutic exercise;Energy conservation;DME and/or AE instruction;Therapeutic activities;Patient/family education;Balance training    OT Goals(Current goals can be found in the care plan section) Acute Rehab OT Goals Patient Stated Goal: home OT Goal Formulation: With patient/family Time For Goal Achievement: 06/20/17 Potential to Achieve Goals: Good  OT Frequency: Min 2X/week   Barriers to D/C:            Co-evaluation              AM-PAC PT "6 Clicks" Daily Activity     Outcome Measure Help from another person eating meals?: A Little Help from another person taking care of personal grooming?: A Little Help from another person toileting, which includes using toliet, bedpan, or urinal?: A Lot Help from another person bathing (including washing, rinsing, drying)?: A Lot Help from another person to put on and taking off regular upper body clothing?: A Little Help from another person to put on and taking off regular lower body clothing?: A Lot 6 Click Score: 15   End of Session Equipment Utilized During Treatment: Gait belt;Rolling walker Nurse Communication: Mobility status  Activity Tolerance: Patient tolerated treatment well Patient left: in bed;with call bell/phone within reach;with bed alarm set;with family/visitor present;Other (comment)(with bed in chair position)  OT Visit Diagnosis: Other abnormalities of gait and mobility (R26.89);Muscle weakness (generalized) (M62.81);Other symptoms and signs involving cognitive function  Time: 3358-2518 OT Time Calculation (min): 31 min Charges:  OT General Charges $OT Visit: 1 Visit OT Evaluation $OT Eval Moderate Complexity: 1 Mod OT Treatments $Self Care/Home Management : 8-22 mins G-Codes:     Norman Herrlich, MS OTR/L  Pager:  [place] A [name] 06/06/2017, 6:32 PM

## 2017-06-06 NOTE — ED Provider Notes (Signed)
[facility] 6E PROGRESSIVE CARE Provider Note   CSN: [id number] Arrival date & time: 06/05/17  2033     History   Chief Complaint Chief Complaint  Patient presents with  . Hypertension    HPI [name] is a 72 y.o. female.  72 yo F with a chief complaint of weakness.  The patient has been having trouble walking today and has been feeling short of breath.  She denies any chest pain denies diaphoresis nausea or vomiting.  She has been eating and drinking normally.  She had a stroke in the past that left her with bilateral lower extremity weakness worse on the left.  Normally walks with a walker.  Today she is felt worried that she would fall down with walking felt that her balance was off.  She denies dysuria increased frequency or hesitancy.  Denies abdominal pain.  The history is provided by the patient.  Hypertension  This is a new problem. The current episode started 6 to 12 hours ago. The problem occurs constantly. The problem has not changed since onset.Pertinent negatives include no chest pain, no headaches and no shortness of breath. Nothing aggravates the symptoms. Nothing relieves the symptoms. She has tried nothing for the symptoms. The treatment provided no relief.    Past Medical History:  Diagnosis Date  . Adenocarcinoma of breast ([place]) 1996   Completed tamoxifen and had mastectomy.  . Aortic stenosis, severe    s/p aortic valve replacement with porcine valve 06/2004.  ECHO 2010 EF 76%, LVH, diastolic dysfxn, Bioprostetic aoritc valve, mild AS. ECHO 2013 EF 60%, Nl aortic artificial valve, dynamic obstruction in the outflow tract   Class IIb rec for annual TTE after 5 yrs. She had a TTE 2013    . Arthritis   . CAD (coronary artery disease) 2006   s/p CABG (5/06) w/ saphenous vein to RCA at time of AVR  . CVA (cerebral infarction) 2006   Post-op from AVR. Presumed embolic in nature. Carotid stenosis of R 60-79%. Repeat dopplers 4/10 no R stenosis and L stenosis of  1-29%.  . Depression    Controlled on Paxil  . Diabetes mellitus 1992   Dx 04/25/1990. Now insulin dependent, started 2008. On ACEI.   . Diverticulosis 2001  . Hyperlipidemia    Mgmt with a statin  . Hypertension    Requires 4 drug tx  . Osteoporosis 2006   DEXA 10/06 : L femur T -2.8, R -2.7. Lumbar T -2.4. On bisphosphonates and  Calcium / Vit D.  . Refusal of blood transfusions as patient is Jehovah's Witness     Patient Active Problem List   Diagnosis Date Noted  . Hypertensive urgency 06/06/2017  . B12 deficiency 05/01/2017  . Bilateral lower extremity edema 11/08/2015  . Atherosclerosis of aorta ([place]) 06/07/2014  . Abnormality of gait 05/29/2012  . HOCM (hypertrophic obstructive cardiomyopathy) ([place]) 06/11/2011  . CKD (chronic kidney disease) stage 2, GFR 60-89 ml/min 06/03/2011  . Routine health maintenance 06/10/2010  . Hyperlipidemia   . Refusal of blood transfusions as patient is Jehovah's Witness   . History of adenocarcinoma of breast   . Osteoporosis   . Status post CVA   . Aortic stenosis s/p Tissure AVR 2006   . CAD (coronary artery disease)   . Hypertension   . Depression 01/23/2006  . Diabetes mellitus with renal manifestations, controlled ([place]) 03/25/1990    Past Surgical History:  Procedure Laterality Date  . ABDOMINAL HYSTERECTOMY  1987  for fibroids  . AORTIC VALVE REPLACEMENT  2006  . CHOLECYSTECTOMY    . CORONARY ARTERY BYPASS GRAFT  2006   Saphenous vein to RCA at time of AVR. Course complicated by acute respiratory failure, post-op PTX, ARI, ileus, CVA  . MASTECTOMY  1995   L for adenocarcinoma     OB History   None      Home Medications    Prior to Admission medications   Medication Sig Start Date End Date Taking? Authorizing Provider  ACCU-CHEK FASTCLIX LANCETS MISC Use to check blood sugar up to 3 times a day 04/01/17   [name], MD  acetaminophen (TYLENOL) 325 MG tablet Take 650 mg by mouth every 6 (six) hours as needed  for moderate pain.    [provider]  aspirin 81 MG EC tablet TAKE 1 TABLET (81 MG TOTAL) BY MOUTH DAILY. SWALLOW WHOLE. 03/31/17   [name], MD  atorvastatin (LIPITOR) 40 MG tablet Take 1 tablet (40 mg total) by mouth daily. 07/16/16   [name], MD  diltiazem (CARDIZEM CD) 120 MG 24 hr capsule Take 1 capsule (120 mg total) by mouth daily. Take in addition to the diltiazem 240 mg for a total dose of 360 mg. 05/01/17 05/01/18  [name], MD  diltiazem (DILACOR XR) 240 MG 24 hr capsule Take 1 capsule (240 mg total) by mouth daily. 03/31/17   [name], MD  gabapentin (NEURONTIN) 300 MG capsule Take 1 capsule (300 mg total) by mouth 3 (three) times daily. 03/31/17   [name], MD  glucose blood (ACCU-CHEK GUIDE) test strip Check blood sugar 3 times a day as instructed 04/01/17   [name], MD  insulin aspart (NOVOLOG FLEXPEN) 100 UNIT/ML FlexPen Inject 10 Units into the skin 3 (three) times daily with meals. 03/31/17   [name], MD  Insulin Detemir (LEVEMIR FLEXTOUCH) 100 UNIT/ML Pen Inject 24 Units into the skin at bedtime. 03/31/17   [name], MD  Insulin Pen Needle (PEN NEEDLES 3/16") 31G X 5 MM MISC 1 each by Does not apply route 4 (four) times daily. Use to check blood sugars 4 times a day. Dx code E11.22 03/31/17   [name], MD  lisinopril (PRINIVIL,ZESTRIL) 40 MG tablet Take 1 tablet (40 mg total) by mouth daily. 03/31/17   [name], MD  Loratadine 10 MG CAPS Take 1 capsule (10 mg total) by mouth daily. 06/07/14   [name], MD  metFORMIN (GLUCOPHAGE) 1000 MG tablet Take 1 tablet (1,000 mg total) by mouth 2 (two) times daily with a meal. 03/31/17   [name], MD  metoprolol succinate (TOPROL-XL) 25 MG 24 hr tablet Take 1 tablet (25 mg total) by mouth daily. 03/31/17   [name], MD  PARoxetine (PAXIL) 20 MG tablet Take 1 tablet (20 mg total) by mouth daily. 03/31/17    [name], MD    Family History Family History  Problem Relation Age of Onset  . Diabetes Mother   . Hypertension Mother   . Alzheimer's disease Mother   . Heart disease Father 52       AMI at age 23 and 7  . Mental illness Sister   . Heart disease Sister 76       AMI  . Kidney disease Sister     Social History Social History   Tobacco Use  . Smoking status: Never Smoker  . Smokeless tobacco:  Never Used  Substance Use Topics  . Alcohol use: Yes    Alcohol/week: 0.0 oz    Comment: Occasional beer, monthly  . Drug use: No     Allergies   Patient has no known allergies.   Review of Systems Review of Systems  Constitutional: Negative for chills and fever.  HENT: Negative for congestion and rhinorrhea.   Eyes: Negative for redness and visual disturbance.  Respiratory: Negative for shortness of breath and wheezing.   Cardiovascular: Negative for chest pain and palpitations.  Gastrointestinal: Negative for nausea and vomiting.  Genitourinary: Negative for dysuria and urgency.  Musculoskeletal: Negative for arthralgias and myalgias.  Skin: Negative for pallor and wound.  Neurological: Positive for weakness. Negative for dizziness and headaches.     Physical Exam Updated Vital Signs BP (!) 197/86 (BP Location: Right Arm)   Pulse 80   Temp 98.8 F (37.1 C) (Oral)   Resp 19   Ht 5\' 7"  (1.702 m)   Wt 81.4 kg (179 lb 8 oz)   SpO2 97%   BMI 28.11 kg/m   Physical Exam  Constitutional: She is oriented to person, place, and time. She appears well-developed and well-nourished. No distress.  HENT:  Head: Normocephalic and atraumatic.  Eyes: Pupils are equal, round, and reactive to light. EOM are normal.  Neck: Normal range of motion. Neck supple.  Cardiovascular: Normal rate and regular rhythm. Exam reveals no gallop and no friction rub.  No murmur heard. Pulmonary/Chest: Effort normal. She has no wheezes. She has no rales.  Abdominal: Soft. She  exhibits no distension. There is no tenderness.  Musculoskeletal: She exhibits edema ( 4+ pitting edema bilaterally up to the knees). She exhibits no tenderness.  Neurological: She is alert and oriented to person, place, and time.  Bilateral lower extremity weakness  Skin: Skin is warm and dry. She is not diaphoretic.  Psychiatric: She has a normal mood and affect. Her behavior is normal.  Nursing note and vitals reviewed.    ED Treatments / Results  Labs (all labs ordered are listed, but only abnormal results are displayed) Labs Reviewed  BRAIN NATRIURETIC PEPTIDE - Abnormal; Notable for the following components:      Result Value   B Natriuretic Peptide 438.7 (*)    All other components within normal limits  BASIC METABOLIC PANEL - Abnormal; Notable for the following components:   Glucose, Bld 175 (*)    Creatinine, Ser 1.23 (*)    GFR calc non Af Amer 42 (*)    GFR calc Af Amer 49 (*)    All other components within normal limits  GLUCOSE, CAPILLARY - Abnormal; Notable for the following components:   Glucose-Capillary 147 (*)    All other components within normal limits  CBG MONITORING, ED - Abnormal; Notable for the following components:   Glucose-Capillary 142 (*)    All other components within normal limits  CBC WITH DIFFERENTIAL/PLATELET  I-STAT TROPONIN, ED    EKG EKG Interpretation  Date/Time:  Saturday June 06 2017 02:58:11 EDT Ventricular Rate:  70 PR Interval:    QRS Duration: 150 QT Interval:  451 QTC Calculation: 487 R Axis:   51 Text Interpretation:  Sinus rhythm Prolonged PR interval Probable left atrial enlargement Left bundle branch block No significant change since last tracing Confirmed by [id number] [phone]) on 06/06/2017 3:10:56 AM   Radiology Dg Chest 2 View  Result Date: 06/06/2017 CLINICAL DATA:  Hypertension EXAM: CHEST - 2 VIEW COMPARISON:  05/21/2011 FINDINGS:  Top normal size cardiac silhouette. Aortic atherosclerosis at the arch without  aneurysm. Status post aortic valvular replacement with median sternotomy sutures in place. No acute pulmonary consolidation or CHF. No effusion or pneumothorax. Osteoarthritis of the [facility] and glenohumeral joints. IMPRESSION: No active cardiopulmonary disease. Aortic atherosclerosis. Aortic valvular replacement. Electronically Signed   By: Ashley Royalty M.D.   On: 06/06/2017 02:24    Procedures Procedures (including critical care time)  Medications Ordered in ED Medications  enoxaparin (LOVENOX) injection 40 mg (has no administration in time range)  acetaminophen (TYLENOL) tablet 650 mg (has no administration in time range)    Or  acetaminophen (TYLENOL) suppository 650 mg (has no administration in time range)  polyethylene glycol (MIRALAX / GLYCOLAX) packet 17 g (has no administration in time range)  aspirin EC tablet 81 mg (has no administration in time range)  atorvastatin (LIPITOR) tablet 40 mg (has no administration in time range)  diltiazem (CARDIZEM CD) 24 hr capsule 360 mg (has no administration in time range)  gabapentin (NEURONTIN) capsule 300 mg (has no administration in time range)  metoprolol succinate (TOPROL-XL) 24 hr tablet 25 mg (has no administration in time range)  PARoxetine (PAXIL) tablet 20 mg (has no administration in time range)  hydrALAZINE (APRESOLINE) tablet 25 mg (25 mg Oral Given 06/06/17 0620)  insulin detemir (LEVEMIR) injection 15 Units (has no administration in time range)  insulin aspart (novoLOG) injection 0-15 Units (has no administration in time range)  insulin aspart (novoLOG) injection 0-5 Units (has no administration in time range)  sodium chloride 0.9 % bolus 500 mL (0 mLs Intravenous Stopped 06/06/17 0434)  hydrALAZINE (APRESOLINE) injection 10 mg (10 mg Intravenous Given 06/06/17 0428)     Initial Impression / Assessment and Plan / ED Course  I have reviewed the triage vital signs and the nursing notes.  Pertinent labs & imaging results that were  available during my care of the patient were reviewed by me and considered in my medical decision making (see chart for details).     72 yo F with a chief complaint of bilateral lower extremity weakness.  This is been going on since this morning.  She had a stroke about 8 or 9 years ago which left her with bilateral lower extremity weakness and the need to use a walker.  This is for some reason worse today.  The patient has trouble quantifying this.  She would not walk for me because she is concerned that she is unable to.  She denies back pain denies loss of bowel or bladder.  Her home health nurse told her to come to the ED because her blood pressure was elevated with the symptoms.  She has no focal neurologic deficit.  Will obtain a laboratory evaluation chest x-ray troponin EKG.  Workup is unremarkable with the exception of the BNP being mildly elevated at 400.  I discussed the results with the patient will have the internal medicine service evaluate.  The patients results and plan were reviewed and discussed.   Any x-rays performed were independently reviewed by myself.   Differential diagnosis were considered with the presenting HPI.  Medications  enoxaparin (LOVENOX) injection 40 mg (has no administration in time range)  acetaminophen (TYLENOL) tablet 650 mg (has no administration in time range)    Or  acetaminophen (TYLENOL) suppository 650 mg (has no administration in time range)  polyethylene glycol (MIRALAX / GLYCOLAX) packet 17 g (has no administration in time range)  aspirin EC tablet 81 mg (  has no administration in time range)  atorvastatin (LIPITOR) tablet 40 mg (has no administration in time range)  diltiazem (CARDIZEM CD) 24 hr capsule 360 mg (has no administration in time range)  gabapentin (NEURONTIN) capsule 300 mg (has no administration in time range)  metoprolol succinate (TOPROL-XL) 24 hr tablet 25 mg (has no administration in time range)  PARoxetine (PAXIL) tablet 20  mg (has no administration in time range)  hydrALAZINE (APRESOLINE) tablet 25 mg (25 mg Oral Given 06/06/17 0620)  insulin detemir (LEVEMIR) injection 15 Units (has no administration in time range)  insulin aspart (novoLOG) injection 0-15 Units (has no administration in time range)  insulin aspart (novoLOG) injection 0-5 Units (has no administration in time range)  sodium chloride 0.9 % bolus 500 mL (0 mLs Intravenous Stopped 06/06/17 0434)  hydrALAZINE (APRESOLINE) injection 10 mg (10 mg Intravenous Given 06/06/17 0428)    Vitals:   06/06/17 0427 06/06/17 0428 06/06/17 0530 06/06/17 0547  BP: (!) 179/84 (!) 179/84  (!) 197/86  Pulse:    80  Resp: (!) 21   19  Temp:    98.8 F (37.1 C)  TempSrc:    Oral  SpO2:    97%  Weight:   81.4 kg (179 lb 8 oz)   Height:    '5\' 7"'$  (1.702 m)    Final diagnoses:  Weakness of both lower extremities    Admission/ observation were discussed with the admitting physician, patient and/or family and they are comfortable with the plan.    Final Clinical Impressions(s) / ED Diagnoses   Final diagnoses:  Weakness of both lower extremities    ED Discharge Orders    None       Deno Etienne, [place] 06/06/17 [phone]

## 2017-06-06 NOTE — ED Notes (Signed)
Pt. To XRAY via stretcher. 

## 2017-06-06 NOTE — Telephone Encounter (Signed)
   Reason for call:   I received a call from the home health nurse for [name] name is [name] at 5:38 PM indicating that the patients blood pressure is 200/100.   Pertinent Data:   She has a difficult time evaluating whether or not she is having visual changes but has had the sensation that something is in her eye for the past 3 weeks and it is making her vision appear blurry. She has had a headache that is not worse than the usual intensity of her headaches and does not keep her form sleeping at night. She has not had slurred speech. She has a hesitancy to walk because she feels like she is going to fall.   She has lisinopril 40 mg and metoprolol 25 mg at her home at this time. The nurse notes that the toprol fill date is 03/31/2017 and lisinopril fill date on the bottle is 12/2016. Diltiazem 360 mg daily is on her medication list but the nurse is not able to find this bottle in her home.    Assessment / Plan / Recommendations:  - Blood pressure was rechecked manually - it is 188/100  - Asked to take lisinopril and metoprolol now  - I advised [name] to go to the ED to confirm that her blood pressure has dropped below the hypertensive urgency range   As always, pt is advised that if symptoms worsen or new symptoms arise, they should go to an urgent care facility or to to ER for further evaluation.   Ledell Noss, MD   06/05/2017, 5:48 PM

## 2017-06-06 NOTE — ED Notes (Signed)
Attempted report X1

## 2017-06-06 NOTE — Progress Notes (Signed)
Pt. Transported via stretcher from ER to 6E-28; alert and oriented x4; daughter with pt.  Pt. oriented to room and call button. Coccyx area skin darken with old healed wound- skin intact.

## 2017-06-06 NOTE — Plan of Care (Signed)
  Problem: Education: Goal: Knowledge of General Education information will improve Outcome: Progressing Note:  POC and orders reviewed with pt.   

## 2017-06-06 NOTE — Evaluation (Signed)
Physical Therapy Evaluation Patient Details Name: [name] MRN: [id number] DOB: 04-26-1945 Today's Date: 06/06/2017   History of Present Illness  72 yo with PMH of CAD s/p CABG in 2006, T2DM, HLD, HTN, aortic stenosis s/p porcine valve in 2006, prior CVA, and CKD stage 3 who presents for evaluation of weakness and jitteriness.  She was admitted for hypertensive urgency.     Clinical Impression  Pt admitted with above diagnosis. Pt currently with functional limitations due to the deficits listed below (see PT Problem List). PTA pt lived alone, ambulating household distances modified independent with RW. Her daughter lives next door and provides needed assist with ADLs. She was active with HHPT/OT on admission. Pt denies any recent falls. On eval, she required min assist bed mobility, min assist transfers, and min guard assist ambulation 25 feet with RW. Increased time required to complete all mobility.  Pt with purewick and requesting replacement of purewick following mobilization. Purewick replaced but pt encouraged to transfer to [facility] with nsg staff to prevent further loss of strength/mobility. Pt will benefit from skilled PT to increase their independence and safety with mobility to allow discharge to the venue listed below.  Discussed with pt the option of SNF at d/c due to living alone. Pt declining. She reports having 8 daughters. According to the pt, they work but can provided needed level of care in order for pt to return home.      Follow Up Recommendations Home health PT;Supervision for mobility/OOB    Equipment Recommendations  None recommended by PT    Recommendations for Other Services       Precautions / Restrictions Precautions Precautions: Fall      Mobility  Bed Mobility Overal bed mobility: Needs Assistance Bed Mobility: Supine to Sit;Sit to Supine     Supine to sit: HOB elevated;Min assist Sit to supine: Min assist;HOB elevated   General bed mobility  comments: +rail, increased time and effort  Transfers Overall transfer level: Needs assistance Equipment used: Rolling walker (2 wheeled) Transfers: Sit to/from Stand Sit to Stand: Min assist         General transfer comment: verbal cues for hand placement, increase time to stabilize initial standing balance  Ambulation/Gait Ambulation/Gait assistance: Min guard Ambulation Distance (Feet): 25 Feet Assistive device: Rolling walker (2 wheeled) Gait Pattern/deviations: Step-through pattern;Decreased stride length;Trunk flexed Gait velocity: very slow Gait velocity interpretation: <1.8 ft/sec, indicate of risk for recurrent falls General Gait Details: verbal cues for posture, decreased foot clearance bilat  Stairs            Wheelchair Mobility    Modified Rankin (Stroke Patients Only)       Balance Overall balance assessment: Needs assistance Sitting-balance support: No upper extremity supported;Feet supported Sitting balance-Leahy Scale: Fair     Standing balance support: Bilateral upper extremity supported;During functional activity Standing balance-Leahy Scale: Poor Standing balance comment: reliant on BUE in stance                             Pertinent Vitals/Pain Pain Assessment: Faces Faces Pain Scale: Hurts a little bit Pain Location: bilat ankles due to edema Pain Descriptors / Indicators: Sore Pain Intervention(s): Monitored during session;Repositioned;Limited activity within patient's tolerance    Home Living Family/patient expects to be discharged to:: Private residence Living Arrangements: Alone(Daughter lives in apt next door.) Available Help at Discharge: Family;Available PRN/intermittently(near 24-hour assist) Type of Home: Apartment Home Access: Level entry  Home Layout: One level Home Equipment: Walker - 2 wheels;Shower seat;Bedside commode;Wheelchair - manual      Prior Function Level of Independence: Needs assistance    Gait / Transfers Assistance Needed: Ambulates with RW. Uses BSC during the night. Wheelchair for community mobility.   ADL's / Homemaking Assistance Needed: Daughter assists with bathing, cooking and LB dressing.  Comments: Was active with [facility] services on admission.     Hand Dominance        Extremity/Trunk Assessment   Upper Extremity Assessment Upper Extremity Assessment: Generalized weakness    Lower Extremity Assessment Lower Extremity Assessment: Generalized weakness    Cervical / Trunk Assessment Cervical / Trunk Assessment: Kyphotic  Communication   Communication: No difficulties  Cognition Arousal/Alertness: Awake/alert Behavior During Therapy: WFL for tasks assessed/performed Overall Cognitive Status: Within Functional Limits for tasks assessed                                        General Comments      Exercises General Exercises - Lower Extremity Ankle Circles/Pumps: AROM;Both;10 reps;Supine   Assessment/Plan    PT Assessment Patient needs continued PT services  PT Problem List Decreased strength;Decreased mobility;Decreased activity tolerance;Pain;Decreased balance       PT Treatment Interventions DME instruction;Therapeutic activities;Gait training;Therapeutic exercise;Patient/family education;Balance training;Functional mobility training    PT Goals (Current goals can be found in the Care Plan section)  Acute Rehab PT Goals Patient Stated Goal: home PT Goal Formulation: With patient Time For Goal Achievement: 06/20/17 Potential to Achieve Goals: Good    Frequency Min 3X/week   Barriers to discharge Decreased caregiver support      Co-evaluation               AM-PAC PT "6 Clicks" Daily Activity  Outcome Measure Difficulty turning over in bed (including adjusting bedclothes, sheets and blankets)?: A Lot Difficulty moving from lying on back to sitting on the side of the bed? : A Lot Difficulty sitting down on and  standing up from a chair with arms (e.g., wheelchair, bedside commode, etc,.)?: A Lot Help needed moving to and from a bed to chair (including a wheelchair)?: A Little Help needed walking in hospital room?: A Little Help needed climbing 3-5 steps with a railing? : A Lot 6 Click Score: 14    End of Session Equipment Utilized During Treatment: Gait belt Activity Tolerance: Patient tolerated treatment well Patient left: in bed;with call bell/phone within reach;with bed alarm set Nurse Communication: Mobility status PT Visit Diagnosis: Pain;Other abnormalities of gait and mobility (R26.89);Muscle weakness (generalized) (M62.81)    Time: 5027-7412 PT Time Calculation (min) (ACUTE ONLY): 25 min   Charges:   PT Evaluation $PT Eval Moderate Complexity: 1 Mod PT Treatments $Gait Training: 8-22 mins   PT G Codes:        Lorrin Goodell, PT  Office # [phone] Pager [phone]   Lorriane Shire 06/06/2017, 3:44 PM

## 2017-06-07 ENCOUNTER — Inpatient Hospital Stay (HOSPITAL_COMMUNITY): Payer: Medicare Other

## 2017-06-07 DIAGNOSIS — Z953 Presence of xenogenic heart valve: Secondary | ICD-10-CM | POA: Diagnosis not present

## 2017-06-07 DIAGNOSIS — I351 Nonrheumatic aortic (valve) insufficiency: Secondary | ICD-10-CM | POA: Diagnosis not present

## 2017-06-07 DIAGNOSIS — I447 Left bundle-branch block, unspecified: Secondary | ICD-10-CM | POA: Diagnosis present

## 2017-06-07 DIAGNOSIS — E1122 Type 2 diabetes mellitus with diabetic chronic kidney disease: Secondary | ICD-10-CM | POA: Diagnosis not present

## 2017-06-07 DIAGNOSIS — F329 Major depressive disorder, single episode, unspecified: Secondary | ICD-10-CM | POA: Diagnosis present

## 2017-06-07 DIAGNOSIS — N183 Chronic kidney disease, stage 3 (moderate): Secondary | ICD-10-CM | POA: Diagnosis not present

## 2017-06-07 DIAGNOSIS — I700 Atherosclerosis of aorta: Secondary | ICD-10-CM | POA: Diagnosis present

## 2017-06-07 DIAGNOSIS — Z90710 Acquired absence of both cervix and uterus: Secondary | ICD-10-CM | POA: Diagnosis not present

## 2017-06-07 DIAGNOSIS — Z951 Presence of aortocoronary bypass graft: Secondary | ICD-10-CM | POA: Diagnosis not present

## 2017-06-07 DIAGNOSIS — R011 Cardiac murmur, unspecified: Secondary | ICD-10-CM | POA: Diagnosis not present

## 2017-06-07 DIAGNOSIS — R001 Bradycardia, unspecified: Secondary | ICD-10-CM | POA: Diagnosis not present

## 2017-06-07 DIAGNOSIS — Z853 Personal history of malignant neoplasm of breast: Secondary | ICD-10-CM | POA: Diagnosis not present

## 2017-06-07 DIAGNOSIS — E1151 Type 2 diabetes mellitus with diabetic peripheral angiopathy without gangrene: Secondary | ICD-10-CM | POA: Diagnosis present

## 2017-06-07 DIAGNOSIS — I2510 Atherosclerotic heart disease of native coronary artery without angina pectoris: Secondary | ICD-10-CM | POA: Diagnosis present

## 2017-06-07 DIAGNOSIS — R531 Weakness: Secondary | ICD-10-CM | POA: Diagnosis present

## 2017-06-07 DIAGNOSIS — M1990 Unspecified osteoarthritis, unspecified site: Secondary | ICD-10-CM | POA: Diagnosis present

## 2017-06-07 DIAGNOSIS — Z79899 Other long term (current) drug therapy: Secondary | ICD-10-CM | POA: Diagnosis not present

## 2017-06-07 DIAGNOSIS — I878 Other specified disorders of veins: Secondary | ICD-10-CM | POA: Diagnosis present

## 2017-06-07 DIAGNOSIS — I350 Nonrheumatic aortic (valve) stenosis: Secondary | ICD-10-CM | POA: Diagnosis present

## 2017-06-07 DIAGNOSIS — Z794 Long term (current) use of insulin: Secondary | ICD-10-CM | POA: Diagnosis not present

## 2017-06-07 DIAGNOSIS — I69354 Hemiplegia and hemiparesis following cerebral infarction affecting left non-dominant side: Secondary | ICD-10-CM | POA: Diagnosis not present

## 2017-06-07 DIAGNOSIS — I129 Hypertensive chronic kidney disease with stage 1 through stage 4 chronic kidney disease, or unspecified chronic kidney disease: Secondary | ICD-10-CM | POA: Diagnosis not present

## 2017-06-07 DIAGNOSIS — M6281 Muscle weakness (generalized): Secondary | ICD-10-CM | POA: Diagnosis not present

## 2017-06-07 DIAGNOSIS — Z9010 Acquired absence of unspecified breast and nipple: Secondary | ICD-10-CM | POA: Diagnosis not present

## 2017-06-07 DIAGNOSIS — Z8673 Personal history of transient ischemic attack (TIA), and cerebral infarction without residual deficits: Secondary | ICD-10-CM | POA: Diagnosis not present

## 2017-06-07 DIAGNOSIS — M810 Age-related osteoporosis without current pathological fracture: Secondary | ICD-10-CM | POA: Diagnosis present

## 2017-06-07 DIAGNOSIS — I160 Hypertensive urgency: Secondary | ICD-10-CM | POA: Diagnosis not present

## 2017-06-07 DIAGNOSIS — I69351 Hemiplegia and hemiparesis following cerebral infarction affecting right dominant side: Secondary | ICD-10-CM | POA: Diagnosis not present

## 2017-06-07 DIAGNOSIS — E785 Hyperlipidemia, unspecified: Secondary | ICD-10-CM | POA: Diagnosis present

## 2017-06-07 DIAGNOSIS — R7989 Other specified abnormal findings of blood chemistry: Secondary | ICD-10-CM | POA: Diagnosis present

## 2017-06-07 LAB — GLUCOSE, CAPILLARY
Glucose-Capillary: 137 mg/dL — ABNORMAL HIGH (ref 65–99)
Glucose-Capillary: 172 mg/dL — ABNORMAL HIGH (ref 65–99)
Glucose-Capillary: 181 mg/dL — ABNORMAL HIGH (ref 65–99)
Glucose-Capillary: 232 mg/dL — ABNORMAL HIGH (ref 65–99)

## 2017-06-07 LAB — BASIC METABOLIC PANEL
Anion gap: 9 (ref 5–15)
BUN: 23 mg/dL — ABNORMAL HIGH (ref 6–20)
CO2: 25 mmol/L (ref 22–32)
Calcium: 8.7 mg/dL — ABNORMAL LOW (ref 8.9–10.3)
Chloride: 104 mmol/L (ref 101–111)
Creatinine, Ser: 1.43 mg/dL — ABNORMAL HIGH (ref 0.44–1.00)
GFR calc Af Amer: 41 mL/min — ABNORMAL LOW (ref >60)
GFR calc non Af Amer: 35 mL/min — ABNORMAL LOW (ref >60)
Glucose, Bld: 162 mg/dL — ABNORMAL HIGH (ref 65–99)
Potassium: 4 mmol/L (ref 3.5–5.1)
Sodium: 138 mmol/L (ref 135–145)

## 2017-06-07 NOTE — Progress Notes (Addendum)
   Subjective: Ms. Iseman was seen resting in her bed. She state she is feeling okay today other than some back pain. No headache, shortness of breath, or chest pain. She is open to short term rehab before returning home as recommended by OT. Her family was not present this morning. Patient has no other questions or concerns.  Objective:  Vital signs in last 24 hours: Vitals:   06/06/17 1200 06/06/17 1415 06/06/17 2000 06/07/17 0456  BP: (!) 158/73 136/60 (!) 143/59 139/65  Pulse: 70 63  63  Resp:   (!) 22 20  Temp:  98.8 F (37.1 C) 99.2 F (37.3 C) 99.1 F (37.3 C)  TempSrc:  Oral Oral Oral  SpO2: 97% 98% 98% 96%  Weight:    180 lb 8.9 oz (81.9 kg)  Height:       Physical Exam  Constitutional: She is oriented to person, place, and time. She appears well-developed and well-nourished. No distress.  Cardiovascular: Normal rate, regular rhythm and intact distal pulses.  Systolic Murmur  Pulmonary/Chest: Effort normal and breath sounds normal. No respiratory distress.  Abdominal: Soft. Bowel sounds are normal. She exhibits no distension. There is no tenderness.  Musculoskeletal: She exhibits no tenderness or deformity.  1-2+ Bilateral LE edema  Neurological: She is alert and oriented to person, place, and time.  Skin: Skin is warm and dry.    Assessment/Plan:  Hypertensive urgency - MRI showed no acute intracranial abnormality - BP imprved this morning to 130s-150s/60s-70s  - Continue home Diltiazem 360mg  Daily, Metorprolol 25mg  Daily, and Lisinopril 40mg  Daily  - hydralazine PRN for MAP >96   Generalized weakness due to deconditioning and history of CVA  - MRI showed no acute changes, No signs or symptoms of infection, BMP unremarkable, she is on gabapentin but not on other centrally acting medications  - Echo for follow up of aortic stenosis today - OT recommend SNF - PT recommend HH PT - SW Consult for SNF and HH assistance  Chronic kidney disease stage 3B  - Cr 1.2 on  Admission (Baseline ~1.1-1.2 04/2017) - Cr 1.43 4/14, Will continue to monitor, encourage hydration  Diabetes: On metformin 1000mg  BID, Novolog 10units TID, Levemir 24units qHS at home  - 130s-160s Here - Levemir 15units qHS - SSI-M    Dispo - Social work consult as patients family is concerned for her and would like extensive home care, which her insurance may not cover. Also, OT has recommend SNF for short term rehab prior to returning home. - [name] says that she does not have difficulty with affording medication but does have difficulty with affording copays such as office visits.   Dispo: Anticipated discharge in approximately 1-2 day(s).   Neva Seat, MD 06/07/2017, 6:51 AM Pager: [phone]

## 2017-06-07 NOTE — Progress Notes (Signed)
  Echocardiogram 2D Echocardiogram has been performed.  [name] 06/07/2017, 3:50 PM

## 2017-06-07 NOTE — Progress Notes (Addendum)
Spoke with MD that saw patient earlier today (Dr Hetty Ely) and informed her of patient's low HR and pauses 2-2.19 seconds with HR as low as 38 but pt is sleeping at this time and when awakened has no C/O of discomfort or distress, will continue to monitor for now, no new orders received at this time.

## 2017-06-08 ENCOUNTER — Telehealth: Payer: Self-pay

## 2017-06-08 DIAGNOSIS — M6281 Muscle weakness (generalized): Secondary | ICD-10-CM

## 2017-06-08 LAB — BASIC METABOLIC PANEL
Anion gap: 8 (ref 5–15)
BUN: 21 mg/dL — ABNORMAL HIGH (ref 6–20)
CO2: 27 mmol/L (ref 22–32)
Calcium: 8.8 mg/dL — ABNORMAL LOW (ref 8.9–10.3)
Chloride: 104 mmol/L (ref 101–111)
Creatinine, Ser: 1.3 mg/dL — ABNORMAL HIGH (ref 0.44–1.00)
GFR calc Af Amer: 46 mL/min — ABNORMAL LOW (ref >60)
GFR calc non Af Amer: 40 mL/min — ABNORMAL LOW (ref >60)
Glucose, Bld: 160 mg/dL — ABNORMAL HIGH (ref 65–99)
Potassium: 4.4 mmol/L (ref 3.5–5.1)
Sodium: 139 mmol/L (ref 135–145)

## 2017-06-08 LAB — GLUCOSE, CAPILLARY
Glucose-Capillary: 178 mg/dL — ABNORMAL HIGH (ref 65–99)
Glucose-Capillary: 167 mg/dL — ABNORMAL HIGH (ref 65–99)
Glucose-Capillary: 271 mg/dL — ABNORMAL HIGH (ref 65–99)
Glucose-Capillary: 120 mg/dL — ABNORMAL HIGH (ref 65–99)

## 2017-06-08 MED ORDER — INSULIN ASPART 100 UNIT/ML ~~LOC~~ SOLN
0.0000 [IU] | Freq: Three times a day (TID) | SUBCUTANEOUS | Status: DC
  Administered 2017-06-08: 8 [IU] via SUBCUTANEOUS

## 2017-06-08 MED ORDER — INSULIN ASPART 100 UNIT/ML ~~LOC~~ SOLN: 0.0000 [IU] | Freq: Every day | SUBCUTANEOUS | Status: DC

## 2017-06-08 MED ORDER — INSULIN DETEMIR 100 UNIT/ML ~~LOC~~ SOLN
18.0000 [IU] | Freq: Every day | SUBCUTANEOUS | Status: DC
  Filled 2017-06-08: qty 0.18

## 2017-06-08 MED ORDER — INSULIN ASPART 100 UNIT/ML ~~LOC~~ SOLN
5.0000 [IU] | Freq: Three times a day (TID) | SUBCUTANEOUS | Status: DC
  Administered 2017-06-08: 5 [IU] via SUBCUTANEOUS

## 2017-06-08 NOTE — Progress Notes (Signed)
Discharge order written. Instructions reviewed and given to patient. All questions answered. Pts daughter [name] at bedside for transport home. Pt discharged via wheelchair in stable condition. All belongings left with patient.

## 2017-06-08 NOTE — Progress Notes (Signed)
   Subjective: [name] was seen resting in bed today. She states she is feeling okay. She says she got out of bed to her chair and to use the restroom yesterday. She was encouraged to continue to get out of bed today. She states she is willing to go to a rehab facility and asks that her daughter be updated. She has no other questions or concerns.  Objective:  Vital signs in last 24 hours: Vitals:   06/07/17 1450 06/07/17 2119 06/08/17 0451 06/08/17 0732  BP: (!) 155/68 (!) 135/54 (!) 157/73   Pulse: (!) 56 (!) 47 (!) 52 (!) 55  Resp:   14 18  Temp: 98.6 F (37 C) 98 F (36.7 C) 98.5 F (36.9 C) 98 F (36.7 C)  TempSrc: Oral Oral Oral Oral  SpO2: 95% 95% 95% 97%  Weight:   183 lb 3.2 oz (83.1 kg)   Height:       Physical Exam  Constitutional: She appears well-developed and well-nourished. No distress.  Cardiovascular: Normal rate, regular rhythm and intact distal pulses.  Systolic murmur  Pulmonary/Chest: Effort normal and breath sounds normal. No respiratory distress.  Abdominal: Soft. Bowel sounds are normal. She exhibits no distension. There is no tenderness.  Musculoskeletal: She exhibits no tenderness.  1+ Edema Bilateral Lower Extremities  Neurological: She is alert.  Skin: Skin is warm and dry.   Assessment/Plan:  Hypertensive urgency: Resolved. MRI showed no acute intracranial abnormality - Asymptomatic episode of bradycardia of 40s overnight with one or two pauses <2 sec (likely increased vagal tone) - BP stable in the 627O-350K systolic  - Continue home Diltiazem 360mg  Daily, Metorprolol 25mg  Daily, and Lisinopril 40mg  Daily  Generalized weakness due to deconditioning and history of CVA: MRI showed no acute changes. No signs or symptoms of infection, BMP unremarkable, she is on gabapentin but not on other centrally acting medications. - Echo showed severe calcification and moderate aortic stenosis. - OT recommends SNF,  - PT recommends HH PT - SW consulted to  assist with SNF placement  Chronic kidney disease stage 3B  - Cr 1.2 on Admission (Baseline ~1.1-1.2 04/2017) - Cr 1.43 (4/14), Improved to 1.3 (4/15)  Diabetes: On metformin 1000mg  BID, Novolog 10units TID, Levemir 24units qHS at home  - Levemir 18units qhs - Novolog 5unit TID WC - SSI-M + qhs coverage  Dispo - [name] says that she does not have difficulty with affording medication but does have difficulty with affording copays such as office visits.   - Anticipated discharge in approximately 1-2 days pending SNF Placement.   Neva Seat, MD 06/08/2017, 9:57 AM Pager: [phone]

## 2017-06-08 NOTE — Progress Notes (Signed)
   I saw and examined the patient. I reviewed the resident's note and I agree with the resident's findings and plan as documented in the resident's note.  72 year old woman hospital day #3 with weakness and severe chronic hypertension.  Doing well, blood pressure has been well controlled on oral medications.  I agree with continuing diltiazem, metoprolol, and lisinopril.  We are awaiting placement in subacute rehab for generalized weakness due to deconditioning and high fall risk.  Axel Filler, MD 06/08/2017, 1:04 PM

## 2017-06-08 NOTE — Discharge Summary (Signed)
Name: [name] MRN: [id number] DOB: Jul 28, 1945 72 y.o. PCP: [name], MD  Date of Admission: 06/05/2017  8:35 PM Date of Discharge: 06/08/2017 Attending Physician: Axel Filler, MD  Discharge Diagnosis:  Active Problems:   Hypertension   Aortic atherosclerosis [facility])   Hypertensive urgency   Discharge Medications: Allergies as of 06/08/2017   No Known Allergies     Medication List    TAKE these medications   ACCU-CHEK FASTCLIX LANCETS Misc Use to check blood sugar up to 3 times a day   acetaminophen 325 MG tablet Commonly known as:  TYLENOL Take 650 mg by mouth every 6 (six) hours as needed for moderate pain.   aspirin 81 MG EC tablet TAKE 1 TABLET (81 MG TOTAL) BY MOUTH DAILY. SWALLOW WHOLE.   atorvastatin 40 MG tablet Commonly known as:  LIPITOR Take 1 tablet (40 mg total) by mouth daily.   diltiazem 120 MG 24 hr capsule Commonly known as:  CARDIZEM CD Take 1 capsule (120 mg total) by mouth daily. Take in addition to the diltiazem 240 mg for a total dose of 360 mg.   diltiazem 240 MG 24 hr capsule Commonly known as:  DILACOR XR Take 1 capsule (240 mg total) by mouth daily.   docusate sodium 100 MG capsule Commonly known as:  COLACE Take 200 mg by mouth as needed for mild constipation.   gabapentin 300 MG capsule Commonly known as:  NEURONTIN Take 1 capsule (300 mg total) by mouth 3 (three) times daily.   glucose blood test strip Commonly known as:  ACCU-CHEK GUIDE Check blood sugar 3 times a day as instructed   insulin aspart 100 UNIT/ML FlexPen Commonly known as:  NOVOLOG FLEXPEN Inject 10 Units into the skin 3 (three) times daily with meals.   Insulin Detemir 100 UNIT/ML Pen Commonly known as:  LEVEMIR FLEXTOUCH Inject 24 Units into the skin at bedtime.   lisinopril 40 MG tablet Commonly known as:  PRINIVIL,ZESTRIL Take 1 tablet (40 mg total) by mouth daily.   Loratadine 10 MG Caps Take 1 capsule (10 mg total) by  mouth daily.   metFORMIN 1000 MG tablet Commonly known as:  GLUCOPHAGE Take 1 tablet (1,000 mg total) by mouth 2 (two) times daily with a meal.   metoprolol succinate 25 MG 24 hr tablet Commonly known as:  TOPROL-XL Take 1 tablet (25 mg total) by mouth daily.   PARoxetine 20 MG tablet Commonly known as:  PAXIL Take 1 tablet (20 mg total) by mouth daily.   Pen Needles 3/16" 31G X 5 MM Misc 1 each by Does not apply route 4 (four) times daily. Use to check blood sugars 4 times a day. Dx code E11.22   tetrahydrozoline 0.05 % ophthalmic solution Place 1 drop into both eyes as needed (dry eyes).       Disposition and follow-up:   [name] was discharged from [facility] [place] condition.  At the hospital follow up visit please address:  Hypertensive urgency: - Blood pressure improved with resumption of home medications, but remained above goal - Ensure Adherence to regimen, and the she is able to afford it - Recheck BP, Optimize regimen as needed - Recheck BMP with resumption of home medications and unclear level of adherence in the past  Generalized weakness due to deconditioning and history of CVA - Ensure patient has been able to obtain home health resources  Other: - Echo showed Grade 2 Diastolic Dysfunction,  which appears to be new from echo in 2017.  2.  Labs / imaging needed at time of follow-up: BMP  3.  Pending labs/ test needing follow-up: None  Follow-up Appointments: Follow-up Information    [facility], [facility] Follow up.   Specialty:  Home Health Services Why:  Registered Nurse, Physical Therapy, Occupational Therapy, Aide, Social Worker- to call from [place] to start care.  Contact information: [address] [place] [address] [phone]           Hospital Course by problem list:   Hypertensive urgency: Patient presented with persistently elevated blood pressures in the ED of 200s-210s/90s-100s. She  reported worsening weakness (as below) but no focal neurologic deficits. MRI in ED showed no acute intracranial abnormality. Mild BNP elevation was noted and ECHO showed normal EF and Moderate stenosis (details below). Patient was started on home Diltiazem $RemoveBefore'360mg'XqixROSwADaan$  Daily, Lisinopril $RemoveBeforeDEI'40mg'KnXQDNARjshEVqIT$  Daily, and Metoprolol $RemoveBefore'25mg'cDLMvIoplXkUP$  Daily with PRN available. Her blood pressure improved back on her home regimen, without need for PRN. She was discharged on her home regimen.  Generalized weakness due to deconditioning and history of CVA: Patient has profound weakness of bilateral lower extremities (L>R) at baseline. She reported worsening of this weakness leading up to admission. She had no focal neurologic deficits on exam and MR Brain in ED was negative for acute abnormalities. She had a mildly elevated BNP but no signs of significant volume overload on exam. Echocardiogram showed: EF 60-65%, G2DD, Moderate Aortic Stenosis. OT reccommended and PT recommended HH PT. Patient unable to obtain coverage for SNP stay. She was discharged with home health PT, OT, RN, Aid, and Education officer, museum.  Discharge Vitals:   BP (!) 172/74 (BP Location: Left Arm)   Pulse 64   Temp (!) 97.5 F (36.4 C) (Oral)   Resp 18   Ht $R'5\' 7"'Rw$  (1.702 m)   Wt 183 lb 3.2 oz (83.1 kg)   SpO2 97%   BMI 28.69 kg/m   Pertinent Labs, Studies, and Procedures:  CBC Latest Ref Rng & Units 06/06/2017 11/10/2011 05/23/2011  WBC 4.0 - 10.5 K/uL 7.5 8.8 11.1(H)  Hemoglobin 12.0 - 15.0 g/dL 12.7 12.9 11.9(L)  Hematocrit 36.0 - 46.0 % 38.9 37.4 35.8(L)  Platelets 150 - 400 K/uL 255 299 175   BMP Latest Ref Rng & Units 06/08/2017 06/07/2017 06/06/2017  Glucose 65 - 99 mg/dL 160(H) 162(H) 175(H)  BUN 6 - 20 mg/dL 21(H) 23(H) 15  Creatinine 0.44 - 1.00 mg/dL 1.30(H) 1.43(H) 1.23(H)  BUN/Creat Ratio 12 - 28 - - -  Sodium 135 - 145 mmol/L 139 138 139  Potassium 3.5 - 5.1 mmol/L 4.4 4.0 3.8  Chloride 101 - 111 mmol/L 104 104 103  CO2 22 - 32 mmol/L $RemoveB'27 25 27  'UMnAukFs$ Calcium 8.9  - 10.3 mg/dL 8.8(L) 8.7(L) 9.0   Admission EKG:  EKG Interpretation  Date/Time:  Saturday June 06 2017 02:58:11 EDT Ventricular Rate:  70 PR Interval:    QRS Duration: 150 QT Interval:  451 QTC Calculation: 487 R Axis:   51 Text Interpretation:  Sinus rhythm Prolonged PR interval Probable left atrial enlargement Left bundle branch block No significant change since last tracing Confirmed by Deno Etienne [id number]) on 06/06/2017 3:10:56 AM Also confirmed by Deno Etienne [phone]), editor Philomena Doheny [id number])  on 06/06/2017 8:46:10 AM      Echocardiogram: Study Conclusions - Left ventricle: The cavity size was normal. Wall thickness was increased in a pattern of moderate LVH. Systolic function was  normal. The estimated ejection fraction was in the range of 60% to 65%. Wall motion was normal; there were no regional wall motion abnormalities. Features are consistent with a pseudonormal left ventricular filling pattern, with concomitant abnormal relaxation and increased filling pressure (grade 2 diastolic dysfunction). - Aortic valve: Probably trileaflet; severely calcified leaflets. There was moderate stenosis. There was trivial regurgitation. Mean gradient (S): 28 mm Hg. Peak gradient (S): 49 mm Hg. - Mitral valve: Severely calcified annulus. The findings are consistent with mild stenosis. There was no significant regurgitation. Mean gradient (D): 5 mm Hg. Valve area by pressure half-time: 1.93 cm^2. - Left atrium: The atrium was mildly dilated. - Right ventricle: The cavity size was normal. Systolic function was normal. - Pulmonary arteries: No complete TR doppler jet so unable to estimate PA systolic pressure. - Inferior vena cava: The vessel was normal in size. The respirophasic diameter changes were in the normal range (>= 50%), consistent with normal central venous pressure. Impressions: - Normal LV size with moderate LV hypertrophy. EF 60-65%. Moderate diastolic dysfunction. Normal RV size and  systolic function. The aortic valve was not well-visualized but aortic stenosis was probably moderate. Mild mitral stenosis. No significant valvular abnormalities.  CXR 2 View: IMPRESSION: No active cardiopulmonary disease. Aortic atherosclerosis. Aortic valvular replacement.  MR Brain: IMPRESSION: 1.  No acute intracranial abnormality. 2. Signal changes most compatible with chronic small vessel disease in the periventricular white matter (greater on the right) and left cerebellum. Chronic signal abnormality in the both globus pallidus might be related.  Discharge Instructions: Discharge Instructions    Diet - low sodium heart healthy   Complete by:  As directed    Discharge instructions   Complete by:  As directed    Thank you for allowing [place] to care for you  Your symptoms were due to a severely elevated blood pressure - Please take yout Metoprolol, Diltiazxem, and Lisinopril as prescribed - Home Health has been ordered for yout to help you get your strength back and assist you at home  Please follow up in the clinic on Tuesday, April 23 @ 10:15am   Face-to-face encounter (required for Medicare/Medicaid patients)   Complete by:  As directed    I Neva Seat certify that this patient is under my care and that I, or a nurse practitioner or physician's assistant working with me, had a face-to-face encounter that meets the physician face-to-face encounter requirements with this patient on 06/08/2017. The encounter with the patient was in whole, or in part for the following medical condition(s) which is the primary reason for home health care (List medical condition): Status Post CVA, Hypertensive Urgency   The encounter with the patient was in whole, or in part, for the following medical condition, which is the primary reason for home health care:  Status Post CVA, Hypertensive Urgency   I certify that, based on my findings, the following services are medically necessary home health  services:   Nursing Physical therapy     Reason for Medically Necessary Home Health Services:  Skilled Nursing- Change/Decline in Patient Status   My clinical findings support the need for the above services:  Unsafe ambulation due to balance issues   Further, I certify that my clinical findings support that this patient is homebound due to:  Ambulates short distances less than 300 feet   Home Health   Complete by:  As directed    To provide the following care/treatments:   PT [place]  Social work     Increase activity slowly   Complete by:  As directed       Signed: Neva Seat, MD 06/09/2017, 8:02 AM   Pager: [phone]

## 2017-06-08 NOTE — Telephone Encounter (Signed)
Hospital TOC per Dr Trilby Drummer, discharge 06/08/2017, appt 06/16/2017.

## 2017-06-08 NOTE — Discharge Instructions (Signed)

## 2017-06-08 NOTE — Care Management Note (Signed)
Case Management Note  Patient Details  Name: [name] MRN: [id number] Date of Birth: 1945-11-25  Subjective/Objective: Pt presented for Hypertensive urgency. PTA from home alone and has support of daughters and once lives behind the patient that provides care. Pt does not have a 3 day qualifying stay for SNF and pt/ family can not afford out of pocket stay. Pt has [place] and qualifies for home first program.                  Action/Plan: CM did discuss case with family and agreeable to services. CM did make referral to [facility] with [facility]. SOC to begin within 24-48 hours post d/c. CM did call Physician Advisor to make him aware. Pt has DME RW- per family no DME needs at this time.  Expected Discharge Date:                  Expected Discharge Plan:  [place]  In-House Referral:  Clinical Social Work  Discharge planning Services  CM Consult  Post Acute Care Choice:  Home Health Choice offered to:  Patient, Adult Children  DME Arranged:  N/A DME Agency:  NA  HH Arranged:  RN, Disease Management, PT, OT, Nurse's Aide, Social Work [employer] Agency:  [facility](Home First Program. )  Status of Service:  Completed, signed off  If discussed at [employer], dates discussed:    Additional Comments:  Bethena Roys, RN 06/08/2017, 2:33 PM

## 2017-06-09 ENCOUNTER — Other Ambulatory Visit: Payer: Self-pay | Admitting: *Deleted

## 2017-06-09 NOTE — Telephone Encounter (Signed)
Transition Care Management Follow-up Telephone Call   Date discharged?06/08/17   How have you been since you were released from the hospital?" Very well, a little tired"   Do you understand why you were in the hospital? "yes, they were concerned about my blood pressure"   Do you understand the discharge instructions? "yes, my daughters help me"   Where were you discharged to? home   Items Reviewed:  Medications reviewed: yes  Allergies reviewed: yes  Dietary changes reviewed: yes  Referrals reviewed: yes   Functional Questionnaire:   Activities of Daily Living (ADLs):   She states they are independent in the following: " i do need somebody to help, my daughters were taking turns but it would be better for all of [place] if someone would come in everyday and help me" States they require assistance with the following: " I hope they can get someone to come and help me, it tires me and would relieve some of our stress"   Any transportation issues/concerns?: no   Any patient concerns? "only the home health people, I was using [facility] and dont know if we should change to the people that called today, [facility] or stay with them" she was informed to speak w/ her family and they can decide who they and she is most comfortable with, she was agreeable and stated she and her daughters will talk tonight and decide   Confirmed importance and date/time of follow-up visits scheduled yes  4/23, [facility], [place] at 1015  Confirmed with patient if condition begins to worsen call PCP or go to the ER.  Patient was given the office number and encouraged to call back with question or concerns.  : yes

## 2017-06-09 NOTE — Patient Outreach (Signed)
Made aware by Tommi Rumps with [facility] that patient enrolled in the [place] program. Advised to contact daughter, [name] at [phone].  Spoke to [name] (daughter) to make aware that [place] Management will assist if transportation or pharmacy needs are identified while on the [place] expresses appreciation for potential [facility] Care Management services while on [place], [place], RN,BSN [facility] Liaison [phone]

## 2017-06-10 DIAGNOSIS — I129 Hypertensive chronic kidney disease with stage 1 through stage 4 chronic kidney disease, or unspecified chronic kidney disease: Secondary | ICD-10-CM | POA: Diagnosis not present

## 2017-06-10 DIAGNOSIS — E1122 Type 2 diabetes mellitus with diabetic chronic kidney disease: Secondary | ICD-10-CM | POA: Diagnosis not present

## 2017-06-11 DIAGNOSIS — E1122 Type 2 diabetes mellitus with diabetic chronic kidney disease: Secondary | ICD-10-CM | POA: Diagnosis not present

## 2017-06-11 DIAGNOSIS — I129 Hypertensive chronic kidney disease with stage 1 through stage 4 chronic kidney disease, or unspecified chronic kidney disease: Secondary | ICD-10-CM | POA: Diagnosis not present

## 2017-06-12 DIAGNOSIS — I129 Hypertensive chronic kidney disease with stage 1 through stage 4 chronic kidney disease, or unspecified chronic kidney disease: Secondary | ICD-10-CM | POA: Diagnosis not present

## 2017-06-12 DIAGNOSIS — E1122 Type 2 diabetes mellitus with diabetic chronic kidney disease: Secondary | ICD-10-CM | POA: Diagnosis not present

## 2017-06-15 DIAGNOSIS — E1122 Type 2 diabetes mellitus with diabetic chronic kidney disease: Secondary | ICD-10-CM | POA: Diagnosis not present

## 2017-06-15 DIAGNOSIS — I129 Hypertensive chronic kidney disease with stage 1 through stage 4 chronic kidney disease, or unspecified chronic kidney disease: Secondary | ICD-10-CM | POA: Diagnosis not present

## 2017-06-16 ENCOUNTER — Encounter: Payer: Self-pay | Admitting: Internal Medicine

## 2017-06-16 ENCOUNTER — Ambulatory Visit: Payer: Medicare Other

## 2017-06-16 DIAGNOSIS — I129 Hypertensive chronic kidney disease with stage 1 through stage 4 chronic kidney disease, or unspecified chronic kidney disease: Secondary | ICD-10-CM | POA: Diagnosis not present

## 2017-06-16 DIAGNOSIS — E1122 Type 2 diabetes mellitus with diabetic chronic kidney disease: Secondary | ICD-10-CM | POA: Diagnosis not present

## 2017-06-17 DIAGNOSIS — E1122 Type 2 diabetes mellitus with diabetic chronic kidney disease: Secondary | ICD-10-CM | POA: Diagnosis not present

## 2017-06-17 DIAGNOSIS — I129 Hypertensive chronic kidney disease with stage 1 through stage 4 chronic kidney disease, or unspecified chronic kidney disease: Secondary | ICD-10-CM | POA: Diagnosis not present

## 2017-06-18 ENCOUNTER — Ambulatory Visit (INDEPENDENT_AMBULATORY_CARE_PROVIDER_SITE_OTHER): Payer: Medicare Other | Admitting: Internal Medicine

## 2017-06-18 ENCOUNTER — Encounter: Payer: Self-pay | Admitting: Internal Medicine

## 2017-06-18 VITALS — BP 133/75 | HR 66 | Wt 188.0 lb

## 2017-06-18 DIAGNOSIS — Z9181 History of falling: Secondary | ICD-10-CM

## 2017-06-18 DIAGNOSIS — R011 Cardiac murmur, unspecified: Secondary | ICD-10-CM | POA: Diagnosis not present

## 2017-06-18 DIAGNOSIS — E538 Deficiency of other specified B group vitamins: Secondary | ICD-10-CM | POA: Diagnosis not present

## 2017-06-18 DIAGNOSIS — I10 Essential (primary) hypertension: Secondary | ICD-10-CM

## 2017-06-18 DIAGNOSIS — R0600 Dyspnea, unspecified: Secondary | ICD-10-CM | POA: Diagnosis not present

## 2017-06-18 DIAGNOSIS — K5900 Constipation, unspecified: Secondary | ICD-10-CM

## 2017-06-18 DIAGNOSIS — Z79899 Other long term (current) drug therapy: Secondary | ICD-10-CM

## 2017-06-18 DIAGNOSIS — E1129 Type 2 diabetes mellitus with other diabetic kidney complication: Secondary | ICD-10-CM

## 2017-06-18 DIAGNOSIS — Z7982 Long term (current) use of aspirin: Secondary | ICD-10-CM | POA: Diagnosis not present

## 2017-06-18 DIAGNOSIS — R269 Unspecified abnormalities of gait and mobility: Secondary | ICD-10-CM | POA: Diagnosis not present

## 2017-06-18 DIAGNOSIS — R531 Weakness: Secondary | ICD-10-CM

## 2017-06-18 DIAGNOSIS — Z0000 Encounter for general adult medical examination without abnormal findings: Secondary | ICD-10-CM

## 2017-06-18 DIAGNOSIS — E1151 Type 2 diabetes mellitus with diabetic peripheral angiopathy without gangrene: Secondary | ICD-10-CM

## 2017-06-18 DIAGNOSIS — H539 Unspecified visual disturbance: Secondary | ICD-10-CM | POA: Diagnosis not present

## 2017-06-18 DIAGNOSIS — I350 Nonrheumatic aortic (valve) stenosis: Secondary | ICD-10-CM

## 2017-06-18 DIAGNOSIS — Z794 Long term (current) use of insulin: Secondary | ICD-10-CM

## 2017-06-18 DIAGNOSIS — Z9012 Acquired absence of left breast and nipple: Secondary | ICD-10-CM | POA: Diagnosis not present

## 2017-06-18 DIAGNOSIS — I129 Hypertensive chronic kidney disease with stage 1 through stage 4 chronic kidney disease, or unspecified chronic kidney disease: Secondary | ICD-10-CM | POA: Diagnosis not present

## 2017-06-18 DIAGNOSIS — Z952 Presence of prosthetic heart valve: Secondary | ICD-10-CM | POA: Diagnosis not present

## 2017-06-18 DIAGNOSIS — R0602 Shortness of breath: Secondary | ICD-10-CM | POA: Diagnosis not present

## 2017-06-18 DIAGNOSIS — E1122 Type 2 diabetes mellitus with diabetic chronic kidney disease: Secondary | ICD-10-CM | POA: Diagnosis not present

## 2017-06-18 DIAGNOSIS — I739 Peripheral vascular disease, unspecified: Secondary | ICD-10-CM

## 2017-06-18 MED ORDER — POLYETHYLENE GLYCOL 3350 17 G PO PACK
17.0000 g | PACK | Freq: Every day | ORAL | 0 refills | Status: DC
Start: 2017-06-18 — End: 2019-07-29

## 2017-06-18 NOTE — Addendum Note (Signed)
Addended by: Marcelino Duster on: 06/18/2017 05:47 PM   Modules accepted: Orders

## 2017-06-18 NOTE — Progress Notes (Signed)
   Subjective:    Patient ID: [name], female    DOB: 1945/05/31, 72 y.o.   MRN: [id number]  HPI  KRUTI HORACEK is here for [place]. Please see the A&P for the status of the pt's chronic medical problems.  ROS : per ROS section and in problem oriented charting. All other systems are negative.  PMHx, Soc hx, and / or Fam hx : Recent hospitalization. I have reviewed the notes and briefly, was hospitalized for a hypertensive urgency.  Her blood pressure normalized on her home medications.  She also had a generalized weakness of the lower extremities.  She was discharged home with physical therapy, occupational therapy, nurse, aide, and a Education officer, museum.  She wanted to go to a SNF but there were insurance issues and she could not self-pay.  She is not interested in placement and an assisted living facility.  Review of Systems  Constitutional:       Generalized weakness, falls  Eyes: Positive for visual disturbance.  Respiratory: Positive for shortness of breath.        +DOE  Cardiovascular:       + calf claudication B  Skin: Negative for wound.  Neurological: Negative for headaches.       No neuropathy sxs       Objective:   Physical Exam  Constitutional: She appears well-developed and well-nourished. No distress.  HENT:  Head: Normocephalic and atraumatic.  Right Ear: External ear normal.  Left Ear: External ear normal.  Nose: Nose normal.  Eyes: Conjunctivae and EOM are normal. Right eye exhibits no discharge. Left eye exhibits no discharge. No scleral icterus.  Cardiovascular: Normal rate and regular rhythm.  Murmur heard. Musculoskeletal: She exhibits edema. She exhibits no tenderness or deformity.  Neurological: She is alert.  Skin: Skin is warm and dry. She is not diaphoretic.  Psychiatric: She has a normal mood and affect. Her behavior is normal. Judgment and thought content normal.          Assessment & Plan:

## 2017-06-18 NOTE — Assessment & Plan Note (Addendum)
She is having extreme gait abnormalities, falls, and dyspnea.  She and her family do not think that she can succeed while living independently at home even though they live nearby.  She had wanted to go to SNF at her discharge but there were insurance issues. Today, she wanted me to fill out an FL 2 so that she could go to an assisted living facility.  She currently only has Medicare but not Medicaid.  She is trying to get Medicaid.  She knows about PACE but currently, without Medicaid, she would not be able to afford PACE.  I completed her FL 2 and faxed it back to her home health agency.    This problem is new. She is describing decreased visual acuity in the left eye for about a month.  She states that she cannot see clearly.  She feels like there is a film over the eye.  She also describes floaters.  She denies temporal headache or jaw claudication or a blind coming down over her eye.  She is overdue for her diabetic eye exam and I have encouraged her to see ophthalmologist although and no co-pays are an issue.  PLAN : optho referral    This problem is chronic and stable.  She describes constipation.  At her last appointment, she was describing fecal incontinence also but did not get the abdominal x-ray to see if she had a impaction.  I have recommended Colace and PEG to treat her constipation.  PLAN : colace and PEG

## 2017-06-18 NOTE — Assessment & Plan Note (Signed)
This problem is chronic and improved.  Her blood pressure is at goal on her home medications of diltiazem 360, lisinopril 40, and metoprolol 25.  She is having no side effects to these medications.  PLAN:  Cont current meds   BP Readings from Last 3 Encounters:  06/18/17 133/75  06/08/17 (!) 172/74  04/30/17 (!) 186/95

## 2017-06-18 NOTE — Assessment & Plan Note (Signed)
This problem is chronic and stable. She has been diagnosed with a B12 deficiency. She was started on IM B12 injections back earlier this year. She got a second injection in March. I encouraged her to pick up oral B12 at that time but she never did. I gave her another, her third, B12 injection today. And I wrote on her AVS to get 1000 mcg of B12 intake daily.  PLAN : B12 IM today  B12 1000 mcg PO QD

## 2017-06-18 NOTE — Patient Instructions (Signed)
1. I will complete the assisted living paper work today 2. GABAPENTIN - take one pill twice a day for one week then take one pill at bedtime for one week then stop 3. You got a B12 shot today 4. Take B12 1000 mcg once a day by mouth 5. I am referring you to an eye doctor 6. For constipation, take PEG (Miralax) once a day. May increase or decrease dose based on response

## 2017-06-18 NOTE — Assessment & Plan Note (Signed)
This problem is new.  I started discussing the necessity of her gabapentin.  She states that she takes it for leg pain.  When I dug into her leg pain, she described calf claudication bilaterally.  My chart research before her appointment indicated that gabapentin was likely started in 2008 after her mastectomy for left breast pain.  She describes no diabetic neuropathy.  Since she was describing classic calf claudication and she has every risk factor known to mankind, we did ABIs in the clinic which were consistent with peripheral arterial disease.  I have referred her to vascular for further evaluation.  She is on a statin and is also on an aspirin.  PLAN : vascular surgery referral Walking program limited by fall risk and DOE Taper gabapentin to off as there is no clinical indication

## 2017-06-18 NOTE — Assessment & Plan Note (Signed)
This problem is chronic but may be worsening.  She describes significant dyspnea when just talking and significant dyspnea on exertion which has progressed recently.  She can now barely walk from one room to the other.  We discussed etiologies and she would have many reasons to be dyspneic.  However, she did have her aortic valve replaced in 2006. She had an echo in 2017 and it was specifically commented that her bioprosthetic valve was functioning normally.  During her recent hospitalization, in April 2019, she had a repeat echo.  Her bioprosthetic valve was not mentioned and it was read as moderate stenosis with severely calcified leaflets.  I have called echo to see if they could take another look at her echo from earlier this month and specifically comment on her bioprosthetic valve so that I can ensure it is functioning properly and is not the etiology of her dyspnea.  PLAN : F/U ECHO aortic valve fxn

## 2017-06-19 NOTE — Addendum Note (Signed)
Addended by: Larey Dresser A on: 06/19/2017 10:09 AM   Modules accepted: Orders

## 2017-06-22 ENCOUNTER — Encounter (HOSPITAL_COMMUNITY): Payer: Self-pay | Admitting: Emergency Medicine

## 2017-06-22 ENCOUNTER — Other Ambulatory Visit: Payer: Self-pay

## 2017-06-22 ENCOUNTER — Inpatient Hospital Stay (HOSPITAL_COMMUNITY): Payer: Medicare Other

## 2017-06-22 ENCOUNTER — Emergency Department (HOSPITAL_COMMUNITY): Payer: Medicare Other

## 2017-06-22 ENCOUNTER — Inpatient Hospital Stay (HOSPITAL_COMMUNITY)
Admission: EM | Admit: 2017-06-22 | Discharge: 2017-06-26 | DRG: 291 | Disposition: A | Discharge status: Skilled Nursing Facility | Payer: Medicare Other | Attending: Internal Medicine | Admitting: Internal Medicine

## 2017-06-22 DIAGNOSIS — M546 Pain in thoracic spine: Secondary | ICD-10-CM | POA: Diagnosis present

## 2017-06-22 DIAGNOSIS — Y92002 Bathroom of unspecified non-institutional (private) residence single-family (private) house as the place of occurrence of the external cause: Secondary | ICD-10-CM | POA: Diagnosis not present

## 2017-06-22 DIAGNOSIS — F339 Major depressive disorder, recurrent, unspecified: Secondary | ICD-10-CM | POA: Diagnosis not present

## 2017-06-22 DIAGNOSIS — M1990 Unspecified osteoarthritis, unspecified site: Secondary | ICD-10-CM | POA: Diagnosis present

## 2017-06-22 DIAGNOSIS — Z794 Long term (current) use of insulin: Secondary | ICD-10-CM

## 2017-06-22 DIAGNOSIS — I509 Heart failure, unspecified: Secondary | ICD-10-CM

## 2017-06-22 DIAGNOSIS — M810 Age-related osteoporosis without current pathological fracture: Secondary | ICD-10-CM | POA: Diagnosis present

## 2017-06-22 DIAGNOSIS — I421 Obstructive hypertrophic cardiomyopathy: Secondary | ICD-10-CM | POA: Diagnosis present

## 2017-06-22 DIAGNOSIS — S199XXA Unspecified injury of neck, initial encounter: Secondary | ICD-10-CM | POA: Diagnosis not present

## 2017-06-22 DIAGNOSIS — Z853 Personal history of malignant neoplasm of breast: Secondary | ICD-10-CM

## 2017-06-22 DIAGNOSIS — K5900 Constipation, unspecified: Secondary | ICD-10-CM | POA: Diagnosis present

## 2017-06-22 DIAGNOSIS — Z90710 Acquired absence of both cervix and uterus: Secondary | ICD-10-CM | POA: Diagnosis not present

## 2017-06-22 DIAGNOSIS — I441 Atrioventricular block, second degree: Secondary | ICD-10-CM | POA: Diagnosis not present

## 2017-06-22 DIAGNOSIS — B027 Disseminated zoster: Secondary | ICD-10-CM | POA: Diagnosis not present

## 2017-06-22 DIAGNOSIS — I43 Cardiomyopathy in diseases classified elsewhere: Secondary | ICD-10-CM | POA: Diagnosis not present

## 2017-06-22 DIAGNOSIS — Z8249 Family history of ischemic heart disease and other diseases of the circulatory system: Secondary | ICD-10-CM

## 2017-06-22 DIAGNOSIS — E7849 Other hyperlipidemia: Secondary | ICD-10-CM | POA: Diagnosis not present

## 2017-06-22 DIAGNOSIS — R278 Other lack of coordination: Secondary | ICD-10-CM | POA: Diagnosis not present

## 2017-06-22 DIAGNOSIS — I5020 Unspecified systolic (congestive) heart failure: Secondary | ICD-10-CM

## 2017-06-22 DIAGNOSIS — I5031 Acute diastolic (congestive) heart failure: Secondary | ICD-10-CM | POA: Diagnosis not present

## 2017-06-22 DIAGNOSIS — I447 Left bundle-branch block, unspecified: Secondary | ICD-10-CM | POA: Diagnosis present

## 2017-06-22 DIAGNOSIS — I350 Nonrheumatic aortic (valve) stenosis: Secondary | ICD-10-CM

## 2017-06-22 DIAGNOSIS — M549 Dorsalgia, unspecified: Secondary | ICD-10-CM

## 2017-06-22 DIAGNOSIS — I130 Hypertensive heart and chronic kidney disease with heart failure and stage 1 through stage 4 chronic kidney disease, or unspecified chronic kidney disease: Secondary | ICD-10-CM | POA: Diagnosis not present

## 2017-06-22 DIAGNOSIS — R509 Fever, unspecified: Secondary | ICD-10-CM | POA: Diagnosis not present

## 2017-06-22 DIAGNOSIS — Z8673 Personal history of transient ischemic attack (TIA), and cerebral infarction without residual deficits: Secondary | ICD-10-CM

## 2017-06-22 DIAGNOSIS — Z9114 Patient's other noncompliance with medication regimen: Secondary | ICD-10-CM | POA: Diagnosis not present

## 2017-06-22 DIAGNOSIS — N182 Chronic kidney disease, stage 2 (mild): Secondary | ICD-10-CM | POA: Diagnosis present

## 2017-06-22 DIAGNOSIS — R531 Weakness: Secondary | ICD-10-CM | POA: Diagnosis not present

## 2017-06-22 DIAGNOSIS — Z818 Family history of other mental and behavioral disorders: Secondary | ICD-10-CM

## 2017-06-22 DIAGNOSIS — R6881 Early satiety: Secondary | ICD-10-CM | POA: Diagnosis present

## 2017-06-22 DIAGNOSIS — Z951 Presence of aortocoronary bypass graft: Secondary | ICD-10-CM

## 2017-06-22 DIAGNOSIS — I2510 Atherosclerotic heart disease of native coronary artery without angina pectoris: Secondary | ICD-10-CM | POA: Diagnosis present

## 2017-06-22 DIAGNOSIS — R279 Unspecified lack of coordination: Secondary | ICD-10-CM | POA: Diagnosis not present

## 2017-06-22 DIAGNOSIS — W010XXA Fall on same level from slipping, tripping and stumbling without subsequent striking against object, initial encounter: Secondary | ICD-10-CM | POA: Diagnosis present

## 2017-06-22 DIAGNOSIS — R001 Bradycardia, unspecified: Secondary | ICD-10-CM | POA: Diagnosis not present

## 2017-06-22 DIAGNOSIS — I739 Peripheral vascular disease, unspecified: Secondary | ICD-10-CM | POA: Diagnosis not present

## 2017-06-22 DIAGNOSIS — I110 Hypertensive heart disease with heart failure: Secondary | ICD-10-CM | POA: Diagnosis not present

## 2017-06-22 DIAGNOSIS — N179 Acute kidney failure, unspecified: Secondary | ICD-10-CM | POA: Diagnosis not present

## 2017-06-22 DIAGNOSIS — Z833 Family history of diabetes mellitus: Secondary | ICD-10-CM

## 2017-06-22 DIAGNOSIS — R011 Cardiac murmur, unspecified: Secondary | ICD-10-CM | POA: Diagnosis not present

## 2017-06-22 DIAGNOSIS — S299XXA Unspecified injury of thorax, initial encounter: Secondary | ICD-10-CM | POA: Diagnosis not present

## 2017-06-22 DIAGNOSIS — R140 Abdominal distension (gaseous): Secondary | ICD-10-CM | POA: Diagnosis not present

## 2017-06-22 DIAGNOSIS — R52 Pain, unspecified: Secondary | ICD-10-CM | POA: Diagnosis not present

## 2017-06-22 DIAGNOSIS — W19XXXA Unspecified fall, initial encounter: Secondary | ICD-10-CM

## 2017-06-22 DIAGNOSIS — Z9012 Acquired absence of left breast and nipple: Secondary | ICD-10-CM

## 2017-06-22 DIAGNOSIS — M542 Cervicalgia: Secondary | ICD-10-CM | POA: Diagnosis not present

## 2017-06-22 DIAGNOSIS — Z9181 History of falling: Secondary | ICD-10-CM | POA: Diagnosis not present

## 2017-06-22 DIAGNOSIS — F329 Major depressive disorder, single episode, unspecified: Secondary | ICD-10-CM | POA: Diagnosis present

## 2017-06-22 DIAGNOSIS — I5033 Acute on chronic diastolic (congestive) heart failure: Secondary | ICD-10-CM | POA: Diagnosis not present

## 2017-06-22 DIAGNOSIS — N183 Chronic kidney disease, stage 3 (moderate): Secondary | ICD-10-CM | POA: Diagnosis present

## 2017-06-22 DIAGNOSIS — R0602 Shortness of breath: Secondary | ICD-10-CM | POA: Diagnosis not present

## 2017-06-22 DIAGNOSIS — Z953 Presence of xenogenic heart valve: Secondary | ICD-10-CM | POA: Diagnosis not present

## 2017-06-22 DIAGNOSIS — I129 Hypertensive chronic kidney disease with stage 1 through stage 4 chronic kidney disease, or unspecified chronic kidney disease: Secondary | ICD-10-CM | POA: Diagnosis not present

## 2017-06-22 DIAGNOSIS — Z841 Family history of disorders of kidney and ureter: Secondary | ICD-10-CM

## 2017-06-22 DIAGNOSIS — M6281 Muscle weakness (generalized): Secondary | ICD-10-CM | POA: Diagnosis not present

## 2017-06-22 DIAGNOSIS — Z8679 Personal history of other diseases of the circulatory system: Secondary | ICD-10-CM | POA: Diagnosis not present

## 2017-06-22 DIAGNOSIS — Z952 Presence of prosthetic heart valve: Secondary | ICD-10-CM | POA: Diagnosis not present

## 2017-06-22 DIAGNOSIS — I440 Atrioventricular block, first degree: Secondary | ICD-10-CM | POA: Diagnosis not present

## 2017-06-22 DIAGNOSIS — I10 Essential (primary) hypertension: Secondary | ICD-10-CM | POA: Diagnosis present

## 2017-06-22 DIAGNOSIS — I5032 Chronic diastolic (congestive) heart failure: Secondary | ICD-10-CM

## 2017-06-22 DIAGNOSIS — E119 Type 2 diabetes mellitus without complications: Secondary | ICD-10-CM | POA: Diagnosis present

## 2017-06-22 DIAGNOSIS — E1151 Type 2 diabetes mellitus with diabetic peripheral angiopathy without gangrene: Secondary | ICD-10-CM | POA: Diagnosis not present

## 2017-06-22 DIAGNOSIS — Z7982 Long term (current) use of aspirin: Secondary | ICD-10-CM

## 2017-06-22 DIAGNOSIS — E1122 Type 2 diabetes mellitus with diabetic chronic kidney disease: Secondary | ICD-10-CM | POA: Diagnosis not present

## 2017-06-22 DIAGNOSIS — S3991XA Unspecified injury of abdomen, initial encounter: Secondary | ICD-10-CM | POA: Diagnosis not present

## 2017-06-22 DIAGNOSIS — E1129 Type 2 diabetes mellitus with other diabetic kidney complication: Secondary | ICD-10-CM | POA: Diagnosis present

## 2017-06-22 DIAGNOSIS — E785 Hyperlipidemia, unspecified: Secondary | ICD-10-CM | POA: Diagnosis present

## 2017-06-22 DIAGNOSIS — Z79899 Other long term (current) drug therapy: Secondary | ICD-10-CM

## 2017-06-22 DIAGNOSIS — I160 Hypertensive urgency: Secondary | ICD-10-CM | POA: Diagnosis not present

## 2017-06-22 DIAGNOSIS — Z743 Need for continuous supervision: Secondary | ICD-10-CM | POA: Diagnosis not present

## 2017-06-22 DIAGNOSIS — M25512 Pain in left shoulder: Secondary | ICD-10-CM | POA: Diagnosis not present

## 2017-06-22 HISTORY — DX: Acute on chronic diastolic (congestive) heart failure: I50.33

## 2017-06-22 LAB — CBG MONITORING, ED
Glucose-Capillary: 160 mg/dL — ABNORMAL HIGH (ref 65–99)
Glucose-Capillary: 215 mg/dL — ABNORMAL HIGH (ref 65–99)

## 2017-06-22 LAB — URINALYSIS, ROUTINE W REFLEX MICROSCOPIC
Bacteria, UA: NONE SEEN
Bilirubin Urine: NEGATIVE
Glucose, UA: NEGATIVE mg/dL
Hgb urine dipstick: NEGATIVE
Ketones, ur: NEGATIVE mg/dL
Leukocytes, UA: NEGATIVE
Nitrite: NEGATIVE
Protein, ur: 30 mg/dL — AB
Specific Gravity, Urine: 1.008 (ref 1.005–1.030)
pH: 6 (ref 5.0–8.0)

## 2017-06-22 LAB — BASIC METABOLIC PANEL
Anion gap: 11 (ref 5–15)
BUN: 23 mg/dL — ABNORMAL HIGH (ref 6–20)
CO2: 27 mmol/L (ref 22–32)
Calcium: 9.1 mg/dL (ref 8.9–10.3)
Chloride: 101 mmol/L (ref 101–111)
Creatinine, Ser: 1.37 mg/dL — ABNORMAL HIGH (ref 0.44–1.00)
GFR calc Af Amer: 43 mL/min — ABNORMAL LOW (ref >60)
GFR calc non Af Amer: 37 mL/min — ABNORMAL LOW (ref >60)
Glucose, Bld: 131 mg/dL — ABNORMAL HIGH (ref 65–99)
Potassium: 4.9 mmol/L (ref 3.5–5.1)
Sodium: 139 mmol/L (ref 135–145)

## 2017-06-22 LAB — CBC WITH DIFFERENTIAL/PLATELET
Basophils Absolute: 0 10*3/uL (ref 0.0–0.1)
Basophils Relative: 0 %
Eosinophils Absolute: 0.1 10*3/uL (ref 0.0–0.7)
Eosinophils Relative: 1 %
HCT: 36.4 % (ref 36.0–46.0)
Hemoglobin: 12 g/dL (ref 12.0–15.0)
Lymphocytes Relative: 12 %
Lymphs Abs: 1.4 10*3/uL (ref 0.7–4.0)
MCH: 31.6 pg (ref 26.0–34.0)
MCHC: 33 g/dL (ref 30.0–36.0)
MCV: 95.8 fL (ref 78.0–100.0)
Monocytes Absolute: 0.9 10*3/uL (ref 0.1–1.0)
Monocytes Relative: 8 %
Neutro Abs: 9.5 10*3/uL — ABNORMAL HIGH (ref 1.7–7.7)
Neutrophils Relative %: 79 %
Platelets: 231 10*3/uL (ref 150–400)
RBC: 3.8 MIL/uL — ABNORMAL LOW (ref 3.87–5.11)
RDW: 13.2 % (ref 11.5–15.5)
WBC: 12 10*3/uL — ABNORMAL HIGH (ref 4.0–10.5)

## 2017-06-22 LAB — TROPONIN I
Troponin I: 0.04 ng/mL (ref <0.03)
Troponin I: 0.03 ng/mL (ref <0.03)

## 2017-06-22 LAB — BRAIN NATRIURETIC PEPTIDE: B Natriuretic Peptide: 1160.1 pg/mL — ABNORMAL HIGH (ref 0.0–100.0)

## 2017-06-22 MED ORDER — HYDRALAZINE HCL 20 MG/ML IJ SOLN
INTRAMUSCULAR | Status: AC
  Filled 2017-06-22: qty 1

## 2017-06-22 MED ORDER — SENNOSIDES-DOCUSATE SODIUM 8.6-50 MG PO TABS: 1.0000 | ORAL_TABLET | Freq: Every evening | ORAL | Status: DC | PRN

## 2017-06-22 MED ORDER — HYDRALAZINE HCL 20 MG/ML IJ SOLN: 5.0000 mg | INTRAMUSCULAR | Status: DC | PRN

## 2017-06-22 MED ORDER — FUROSEMIDE 10 MG/ML IJ SOLN
60.0000 mg | Freq: Once | INTRAMUSCULAR | Status: AC
  Administered 2017-06-22: 60 mg via INTRAVENOUS
  Filled 2017-06-22: qty 6

## 2017-06-22 MED ORDER — HYDRALAZINE HCL 20 MG/ML IJ SOLN
5.0000 mg | INTRAMUSCULAR | Status: DC | PRN
  Administered 2017-06-22: 5 mg via INTRAVENOUS

## 2017-06-22 MED ORDER — ACETAMINOPHEN 325 MG PO TABS
650.0000 mg | ORAL_TABLET | Freq: Four times a day (QID) | ORAL | Status: DC | PRN
  Administered 2017-06-22 – 2017-06-26 (×7): 650 mg via ORAL
  Filled 2017-06-22 (×7): qty 2

## 2017-06-22 MED ORDER — HYDRALAZINE HCL 20 MG/ML IJ SOLN: 5.0000 mg | Freq: Four times a day (QID) | INTRAMUSCULAR | Status: DC | PRN

## 2017-06-22 MED ORDER — ALBUTEROL SULFATE (2.5 MG/3ML) 0.083% IN NEBU
5.0000 mg | INHALATION_SOLUTION | Freq: Once | RESPIRATORY_TRACT | Status: AC
  Administered 2017-06-22: 5 mg via RESPIRATORY_TRACT
  Filled 2017-06-22: qty 6

## 2017-06-22 MED ORDER — INSULIN ASPART 100 UNIT/ML ~~LOC~~ SOLN
4.0000 [IU] | Freq: Three times a day (TID) | SUBCUTANEOUS | Status: DC
  Administered 2017-06-23 – 2017-06-26 (×8): 4 [IU] via SUBCUTANEOUS

## 2017-06-22 MED ORDER — NITROGLYCERIN 2 % TD OINT
1.0000 [in_us] | TOPICAL_OINTMENT | Freq: Four times a day (QID) | TRANSDERMAL | Status: DC
  Administered 2017-06-22 – 2017-06-24 (×8): 1 [in_us] via TOPICAL
  Filled 2017-06-22: qty 30
  Filled 2017-06-22: qty 1

## 2017-06-22 MED ORDER — HYDRALAZINE HCL 25 MG PO TABS: 25.0000 mg | ORAL_TABLET | Freq: Three times a day (TID) | ORAL | Status: DC

## 2017-06-22 MED ORDER — ENOXAPARIN SODIUM 40 MG/0.4ML ~~LOC~~ SOLN
40.0000 mg | SUBCUTANEOUS | Status: DC
  Administered 2017-06-22 – 2017-06-25 (×4): 40 mg via SUBCUTANEOUS
  Filled 2017-06-22 (×5): qty 0.4

## 2017-06-22 MED ORDER — INSULIN ASPART 100 UNIT/ML ~~LOC~~ SOLN
0.0000 [IU] | Freq: Three times a day (TID) | SUBCUTANEOUS | Status: DC
  Administered 2017-06-22 – 2017-06-23 (×3): 3 [IU] via SUBCUTANEOUS
  Administered 2017-06-23 – 2017-06-24 (×2): 2 [IU] via SUBCUTANEOUS
  Administered 2017-06-24: 3 [IU] via SUBCUTANEOUS
  Administered 2017-06-24: 4 [IU] via SUBCUTANEOUS
  Administered 2017-06-25: 5 [IU] via SUBCUTANEOUS
  Administered 2017-06-25: 3 [IU] via SUBCUTANEOUS
  Administered 2017-06-26 (×2): 2 [IU] via SUBCUTANEOUS
  Filled 2017-06-22: qty 1

## 2017-06-22 MED ORDER — INSULIN ASPART 100 UNIT/ML ~~LOC~~ SOLN
0.0000 [IU] | Freq: Every day | SUBCUTANEOUS | Status: DC
  Administered 2017-06-22 – 2017-06-24 (×2): 2 [IU] via SUBCUTANEOUS
  Filled 2017-06-22: qty 1

## 2017-06-22 MED ORDER — ACETAMINOPHEN 650 MG RE SUPP: 650.0000 mg | Freq: Four times a day (QID) | RECTAL | Status: DC | PRN

## 2017-06-22 MED ORDER — INSULIN GLARGINE 100 UNIT/ML ~~LOC~~ SOLN
10.0000 [IU] | Freq: Every day | SUBCUTANEOUS | Status: DC
  Administered 2017-06-22 – 2017-06-25 (×4): 10 [IU] via SUBCUTANEOUS
  Filled 2017-06-22 (×5): qty 0.1

## 2017-06-22 NOTE — H&P (Signed)
Date: 06/22/2017               Patient Name:  [name] MRN: [id number]  DOB: [birth year] Age / Sex: 72 y.o., female   PCP: [name], MD         Medical Service: Internal Medicine Teaching Service         Attending Physician: Dr. Rebeca Alert Raynaldo Opitz, MD    First Contact: Dr. Trilby Drummer Pager: [phone]  Second Contact: Dr. Reesa Chew Pager: [phone]       After Hours (After 5p/  First Contact Pager: [phone]  weekends / holidays): Second Contact Pager: [phone]   Chief Complaint: Back Pain, SOB   History of Present Illness: [name] is a 72 yo F with a past medical history of HTN, DM, PAD, Aortic Stenosis s/p repair in 2006 who presented to the ED with complaints of back pain, shortness of breath.   She reports a mechanical fall due to a slippery bathroom floor about 5-6 days ago which led to her falling backwards and hitting her back which has led to lower thoracic back pain which has improved somewhat since onset. However, she has also noted progressively worsening shortness of breath since around that time. She has had DOE which became constant at rest last night. She has been sleeping in a recliner, though attributes this to increased risk of falls when she sleeps in her bed. She has also had associated feeling of fullness in her abdomen which she feels also worsens her breathing. She denies vomiting, diarrhea, last BM two days ago. Her daughter notes that she went several days without taking her medications (estimated about 3 days up until yesterday). Family denies worsening of LE edema.   Of note, patient was recently admitted from 4/12- 4/15 due to generalized weakness, hypertensive urgency.  At that time, BNP was mildly elevated with echocardiogram showing grade 2 diastolic dysfunction with preserved ejection fraction.  In the ED, T 99.2, HR 52, BP 210/79, 97% on 2 L [place]. Labs notable for Cr 1.37, K 4.9, BNP 1, 160, WBC 12. She received albuterol, IV Lasix 60 mg, and a  ntg patch and was admitted for further management.     Meds:  Current Meds  Medication Sig  . acetaminophen (TYLENOL) 325 MG tablet Take 650 mg by mouth every 6 (six) hours as needed for moderate pain.  Marland Kitchen aspirin 81 MG EC tablet TAKE 1 TABLET (81 MG TOTAL) BY MOUTH DAILY. SWALLOW WHOLE.  Marland Kitchen atorvastatin (LIPITOR) 40 MG tablet Take 1 tablet (40 mg total) by mouth daily.  Marland Kitchen diltiazem (CARDIZEM CD) 120 MG 24 hr capsule Take 1 capsule (120 mg total) by mouth daily. Take in addition to the diltiazem 240 mg for a total dose of 360 mg.  . diltiazem (DILACOR XR) 240 MG 24 hr capsule Take 1 capsule (240 mg total) by mouth daily.  Marland Kitchen docusate sodium (COLACE) 100 MG capsule Take 200 mg by mouth as needed for mild constipation.  . insulin aspart (NOVOLOG FLEXPEN) 100 UNIT/ML FlexPen Inject 10 Units into the skin 3 (three) times daily with meals.  . Insulin Detemir (LEVEMIR FLEXTOUCH) 100 UNIT/ML Pen Inject 24 Units into the skin at bedtime.  Marland Kitchen lisinopril (PRINIVIL,ZESTRIL) 40 MG tablet Take 1 tablet (40 mg total) by mouth daily.  . Loratadine 10 MG CAPS Take 1 capsule (10 mg total) by mouth daily.  . metFORMIN (GLUCOPHAGE) 1000 MG tablet Take 1 tablet (1,000 mg total) by mouth  2 (two) times daily with a meal.  . metoprolol succinate (TOPROL-XL) 25 MG 24 hr tablet Take 1 tablet (25 mg total) by mouth daily.  Marland Kitchen PARoxetine (PAXIL) 20 MG tablet Take 1 tablet (20 mg total) by mouth daily.  . polyethylene glycol (MIRALAX / GLYCOLAX) packet Take 17 g by mouth daily.  Marland Kitchen tetrahydrozoline 0.05 % ophthalmic solution Place 1 drop into both eyes as needed (dry eyes).     Allergies: Allergies as of 06/22/2017  . (No Known Allergies)   Past Medical History:  Diagnosis Date  . Adenocarcinoma of breast ([place]) 1996   Completed tamoxifen and had mastectomy.  . Aortic stenosis, severe    s/p aortic valve replacement with porcine valve 06/2004.  ECHO 2010 EF 60%, LVH, diastolic dysfxn, Bioprostetic aoritc valve, mild AS.  ECHO 2013 EF 60%, Nl aortic artificial valve, dynamic obstruction in the outflow tract   Class IIb rec for annual TTE after 5 yrs. She had a TTE 2013    . Arthritis   . CAD (coronary artery disease) 2006   s/p CABG (5/06) w/ saphenous vein to RCA at time of AVR  . CVA (cerebral infarction) 2006   Post-op from AVR. Presumed embolic in nature. Carotid stenosis of R 60-79%. Repeat dopplers 4/10 no R stenosis and L stenosis of 1-29%.  . Depression    Controlled on Paxil  . Diabetes mellitus 1992   Dx 04/25/1990. Now insulin dependent, started 2008. On ACEI.   . Diverticulosis 2001  . Hyperlipidemia    Mgmt with a statin  . Hypertension    Requires 4 drug tx  . Osteoporosis 2006   DEXA 10/06 : L femur T -2.8, R -2.7. Lumbar T -2.4. On bisphosphonates and  Calcium / Vit D.  . Refusal of blood transfusions as patient is Jehovah's Witness     Family History:  Family History  Problem Relation Age of Onset  . Diabetes Mother   . Hypertension Mother   . Alzheimer's disease Mother   . Heart disease Father 1       AMI at age 52 and 63  . Mental illness Sister   . Heart disease Sister 100       AMI  . Kidney disease Sister     Social History:  Social History   Tobacco Use  . Smoking status: Never Smoker  . Smokeless tobacco: Never Used  Substance Use Topics  . Alcohol use: Yes    Alcohol/week: 0.0 oz    Comment: Occasional beer, monthly  . Drug use: No     Review of Systems: A complete ROS was negative except as per HPI.   Physical Exam: Blood pressure (!) 181/73, pulse (!) 51, temperature 99.2 F (37.3 C), temperature source Oral, resp. rate 20, height $RemoveBe'5\' 8"'ySAbyFtBw$  (1.727 m), weight 179 lb (81.2 kg), SpO2 100 %. General: Elderly female resting in bed, no acute distress Head: Normocephalic, atraumatic Eyes: PERRL, EOMI ENT: Difficult to assess JVD with habitus, dental caries, moist mucus membranes without exudate CV: Bradycardic, systolic murmur, regular  Resp: Bilateral crackles  with L>R, no distress  Abd: Upper abdominal distension tympanic to percussion, soft lower abdomen, no tenderness to palpation  Extr: Bilateral pitting LE edema, bilateral LE weakness with 2/5 on L, -3/5 on R, 5/5 bilateral upper extremity strength  Neuro: Alert and oriented x3  Skin: Warm, dry     EKG: personally reviewed my interpretation is sinus rhythm, inverted T waves V4-V6, II, widened QRS with LBBB.  Overall, looks similar to prior.   CXR: personally reviewed my interpretation is sternotomy wires, increased interstitial markings, no effusion or focal consolidations.   Assessment & Plan by Problem:  Diastolic HF Exacerbation Patient presenting with progressive shortness of breath, lower extremity edema (though chronic issue) with signs of volume overload including elevated BNP, interstitial edema on chest x-ray in the setting of several days medication doses.  Echo performed last admission notes diastolic dysfunction, preserved EF 60-65%. Trop mildly positive, EKG similar to prior.  She received IV Lasix 60 mg in ED, also received albuterol though no wheezing appreciated on exam. --Strict I/Os, supplemental O2 prn  --Assess response to IV Lasix 60 and re-dose as indicated --Trend troponin    Abdominal Distension Patient also complaining of feeling of fullness in the abdomen, may be element of edema given above, though on exam she has an area of distention and upper abdomen.  Chart review of PCP notes describe alternating periods of constipation and diarrhea, though no other abdominal symptoms reported-last BM two days ago. No hx of hernia reported.   --KUB   Back Pain s/p Mechanical Fall Notes mechanical fall with resulting thoracic back pain, tenderness to palpation of left lower thoracic paraspinal musculature.  Likely musculoskeletal, will obtain x-rays to assess for fracture or other gross abnormalities. --C and T spine XR   Hypertensive Urgency Blood pressure elevated with 784O  systolic, similar to prior presentations.  This is occurring in setting of recent medication nonadherence.  She is currently bradycardic with several of her home meds potentially exacerbating this, also may have slight elevation in creatinine. --Hydralazine 25 mg TID, re-start home regimen as able   Chronic Kidney Disease History of CKD II, most recent creatinine values are 1.1-1.2, currently 1.37.  May represent a mild AKI.  Will monitor with diuresis, hold ACE inhibitor. --BMP  Weakness, H/o CVA History of CVA with recent admission noted worsening of lower extremity weakness.  She was discharged with home health therapy and other resources with difficulties noted regarding insurance coverage of SNF. Had mechanical fall as above.  --PT/OT   Dispo: Admit patient to Inpatient with expected length of stay greater than 2 midnights.  Signed: Tawny Asal, MD 06/22/2017, 5:04 PM  Pager: [phone]

## 2017-06-22 NOTE — ED Notes (Signed)
Dr. Zenia Resides in to assess; orders given

## 2017-06-22 NOTE — ED Triage Notes (Signed)
Pt in from home via [facility] with L shoulder pain and sob. EMS states pt tripped and fell in her home 5 days ago, landing on L shoulder, denies LOC. Today, pain is worse and pt has sob, sats were 90% on RA. Placed on 2L en route, equal chest rise. bp 205/80

## 2017-06-22 NOTE — ED Provider Notes (Signed)
[facility] Provider Note   CSN: [id number] Arrival date & time: 06/22/17  1331     History   Chief Complaint Chief Complaint  Patient presents with  . Shoulder Pain  . Shortness of Breath    HPI [name] is a 72 y.o. female.  72 year old female presents with 2 days of left posterior back pain that started after she had a mechanical fall and struck a wall.  No loss of consciousness.  No neck pain.  Has dull pain that is now causing her to be short of breath.  Has had upper abdominal fullness but denies any abdominal pain.  Symptoms progressively worsened and is here for evaluation.  No treatment used prior to arrival.     Past Medical History:  Diagnosis Date  . Adenocarcinoma of breast ([place]) 1996   Completed tamoxifen and had mastectomy.  . Aortic stenosis, severe    s/p aortic valve replacement with porcine valve 06/2004.  ECHO 2010 EF 31%, LVH, diastolic dysfxn, Bioprostetic aoritc valve, mild AS. ECHO 2013 EF 60%, Nl aortic artificial valve, dynamic obstruction in the outflow tract   Class IIb rec for annual TTE after 5 yrs. She had a TTE 2013    . Arthritis   . CAD (coronary artery disease) 2006   s/p CABG (5/06) w/ saphenous vein to RCA at time of AVR  . CVA (cerebral infarction) 2006   Post-op from AVR. Presumed embolic in nature. Carotid stenosis of R 60-79%. Repeat dopplers 4/10 no R stenosis and L stenosis of 1-29%.  . Depression    Controlled on Paxil  . Diabetes mellitus 1992   Dx 04/25/1990. Now insulin dependent, started 2008. On ACEI.   . Diverticulosis 2001  . Hyperlipidemia    Mgmt with a statin  . Hypertension    Requires 4 drug tx  . Osteoporosis 2006   DEXA 10/06 : L femur T -2.8, R -2.7. Lumbar T -2.4. On bisphosphonates and  Calcium / Vit D.  . Refusal of blood transfusions as patient is Jehovah's Witness     Patient Active Problem List   Diagnosis Date Noted  . Peripheral arterial disease ([place])  06/18/2017  . B12 deficiency 05/01/2017  . Bilateral lower extremity edema 11/08/2015  . Aortic atherosclerosis ([place]) 06/07/2014  . Abnormality of gait 05/29/2012  . HOCM (hypertrophic obstructive cardiomyopathy) ([place]) 06/11/2011  . CKD (chronic kidney disease) stage 2, GFR 60-89 ml/min 06/03/2011  . Routine health maintenance 06/10/2010  . Hyperlipidemia   . Refusal of blood transfusions as patient is Jehovah's Witness   . History of adenocarcinoma of breast   . Osteoporosis   . Status post CVA   . Aortic stenosis s/p Tissure AVR 2006   . CAD (coronary artery disease)   . Hypertension   . Depression 01/23/2006  . Diabetes mellitus with renal manifestations, controlled ([place]) 03/25/1990    Past Surgical History:  Procedure Laterality Date  . ABDOMINAL HYSTERECTOMY  1987   for fibroids  . AORTIC VALVE REPLACEMENT  2006  . CHOLECYSTECTOMY    . CORONARY ARTERY BYPASS GRAFT  2006   Saphenous vein to RCA at time of AVR. Course complicated by acute respiratory failure, post-op PTX, ARI, ileus, CVA  . MASTECTOMY  1995   L for adenocarcinoma     OB History   None      Home Medications    Prior to Admission medications   Medication Sig Start Date End Date Taking? Authorizing  Provider  ACCU-CHEK FASTCLIX LANCETS MISC Use to check blood sugar up to 3 times a day 04/01/17   [name], MD  acetaminophen (TYLENOL) 325 MG tablet Take 650 mg by mouth every 6 (six) hours as needed for moderate pain.    [provider]  aspirin 81 MG EC tablet TAKE 1 TABLET (81 MG TOTAL) BY MOUTH DAILY. SWALLOW WHOLE. 03/31/17   [name], MD  atorvastatin (LIPITOR) 40 MG tablet Take 1 tablet (40 mg total) by mouth daily. 07/16/16   [name], MD  diltiazem (CARDIZEM CD) 120 MG 24 hr capsule Take 1 capsule (120 mg total) by mouth daily. Take in addition to the diltiazem 240 mg for a total dose of 360 mg. 05/01/17 05/01/18  [name], MD  diltiazem (DILACOR XR)  240 MG 24 hr capsule Take 1 capsule (240 mg total) by mouth daily. 03/31/17   [name], MD  docusate sodium (COLACE) 100 MG capsule Take 200 mg by mouth as needed for mild constipation.    [provider]  glucose blood (ACCU-CHEK GUIDE) test strip Check blood sugar 3 times a day as instructed 04/01/17   [name], MD  insulin aspart (NOVOLOG FLEXPEN) 100 UNIT/ML FlexPen Inject 10 Units into the skin 3 (three) times daily with meals. 03/31/17   [name], MD  Insulin Detemir (LEVEMIR FLEXTOUCH) 100 UNIT/ML Pen Inject 24 Units into the skin at bedtime. 03/31/17   [name], MD  Insulin Pen Needle (PEN NEEDLES 3/16") 31G X 5 MM MISC 1 each by Does not apply route 4 (four) times daily. Use to check blood sugars 4 times a day. Dx code E11.22 03/31/17   [name], MD  lisinopril (PRINIVIL,ZESTRIL) 40 MG tablet Take 1 tablet (40 mg total) by mouth daily. 03/31/17   [name], MD  Loratadine 10 MG CAPS Take 1 capsule (10 mg total) by mouth daily. 06/07/14   [name], MD  metFORMIN (GLUCOPHAGE) 1000 MG tablet Take 1 tablet (1,000 mg total) by mouth 2 (two) times daily with a meal. 03/31/17   [name], MD  metoprolol succinate (TOPROL-XL) 25 MG 24 hr tablet Take 1 tablet (25 mg total) by mouth daily. 03/31/17   [name], MD  PARoxetine (PAXIL) 20 MG tablet Take 1 tablet (20 mg total) by mouth daily. 03/31/17   [name], MD  polyethylene glycol [facility] / Floria Raveling) packet Take 17 g by mouth daily. 06/18/17   [name], MD  tetrahydrozoline 0.05 % ophthalmic solution Place 1 drop into both eyes as needed (dry eyes).    [provider]    Family History Family History  Problem Relation Age of Onset  . Diabetes Mother   . Hypertension Mother   . Alzheimer's disease Mother   . Heart disease Father 32       AMI at age 73 and 97  . Mental illness Sister   . Heart disease Sister 51        AMI  . Kidney disease Sister     Social History Social History   Tobacco Use  . Smoking status: Never Smoker  . Smokeless tobacco: Never Used  Substance Use Topics  . Alcohol use: Yes    Alcohol/week: 0.0 oz    Comment: Occasional beer, monthly  . Drug use: No     Allergies   Patient has no known allergies.   Review of Systems  Review of Systems  All other systems reviewed and are negative.    Physical Exam Updated Vital Signs BP (!) 210/79 (BP Location: Right Arm)   Pulse (!) 52   Temp 99.2 F (37.3 C) (Oral)   Resp 18   Wt 85.3 kg (188 lb)   SpO2 97%   BMI 29.44 kg/m   Physical Exam  Constitutional: She is oriented to person, place, and time. She appears well-developed and well-nourished.  Non-toxic appearance. No distress.  HENT:  Head: Normocephalic and atraumatic.  Eyes: Pupils are equal, round, and reactive to light. Conjunctivae, EOM and lids are normal.  Neck: Normal range of motion. Neck supple. No tracheal deviation present. No thyroid mass present.  Cardiovascular: Normal rate, regular rhythm and normal heart sounds. Exam reveals no gallop.  No murmur heard. Pulmonary/Chest: Effort normal. No stridor. No respiratory distress. She has decreased breath sounds in the left middle field and the left lower field. She has no wheezes. She has no rhonchi. She has no rales.      Abdominal: Soft. Normal appearance and bowel sounds are normal. She exhibits no distension. There is no tenderness. There is no rebound and no CVA tenderness.  Musculoskeletal: Normal range of motion. She exhibits no edema or tenderness.  Neurological: She is alert and oriented to person, place, and time. She has normal strength. No cranial nerve deficit or sensory deficit. GCS eye subscore is 4. GCS verbal subscore is 5. GCS motor subscore is 6.  Skin: Skin is warm and dry. No abrasion and no rash noted.  Psychiatric: She has a normal mood and affect. Her speech is normal and  behavior is normal.  Nursing note and vitals reviewed.    ED Treatments / Results  Labs (all labs ordered are listed, but only abnormal results are displayed) Labs Reviewed  CBC WITH DIFFERENTIAL/PLATELET  BASIC METABOLIC PANEL  TROPONIN I    EKG EKG Interpretation  Date/Time:  Monday June 22 2017 13:43:32 EDT Ventricular Rate:  60 PR Interval:    QRS Duration: 149 QT Interval:  452 QTC Calculation: 452 R Axis:   54 Text Interpretation:  Sinus rhythm Short PR interval Probable left atrial enlargement IVCD, consider atypical LBBB No significant change since last tracing Confirmed by Lacretia Leigh ([id number]) on 06/22/2017 1:51:26 PM   Radiology No results found.  Procedures Procedures (including critical care time)  Medications Ordered in ED Medications  albuterol (PROVENTIL) (2.5 MG/3ML) 0.083% nebulizer solution 5 mg (has no administration in time range)     Initial Impression / Assessment and Plan / ED Course  I have reviewed the triage vital signs and the nursing notes.  Pertinent labs & imaging results that were available during my care of the patient were reviewed by me and considered in my medical decision making (see chart for details).     This with evidence of CHF on chest x-ray as well as elevated BNP.  Was given Lasix 6 no grams IV push.  1 inch of Nitropaste placed on patient's chest.  Blood pressure has improved.  Her work of breathing is greatly improved as well 2.  Does have an elevated troponin at 0.04.  Could be from demand ischemia.  She has no chest pain currently at this time.  No rib fractures.  Will be admitted to the medicine service   CRITICAL CARE Performed by: Leota Jacobsen Total critical care time: 50 minutes Critical care time was exclusive of separately billable procedures and treating other patients.  Critical care was necessary to treat or prevent imminent or life-threatening deterioration. Critical care was time spent personally by  me on the following activities: development of treatment plan with patient and/or surrogate as well as nursing, discussions with consultants, evaluation of patient's response to treatment, examination of patient, obtaining history from patient or surrogate, ordering and performing treatments and interventions, ordering and review of laboratory studies, ordering and review of radiographic studies, pulse oximetry and re-evaluation of patient's condition.   Final Clinical Impressions(s) / ED Diagnoses   Final diagnoses:  None    ED Discharge Orders    None       Lacretia Leigh, MD 06/22/17 1530

## 2017-06-22 NOTE — ED Notes (Signed)
PAGED ADMITTING PER RN ANNA

## 2017-06-22 NOTE — ED Notes (Signed)
PCXR done

## 2017-06-22 NOTE — ED Notes (Signed)
ORDERED DINNER TRAY FOR PT

## 2017-06-22 NOTE — ED Notes (Signed)
Pt reports breathing "better" since breathing tx; resp even, nonlabored; however, pt still has forced exhalation when speaking; o2 sat 94% on 4L o2/[place]; pt resting more comfortably; daughter at bedside

## 2017-06-23 ENCOUNTER — Other Ambulatory Visit: Payer: Self-pay

## 2017-06-23 ENCOUNTER — Encounter (HOSPITAL_COMMUNITY): Payer: Self-pay | Admitting: General Practice

## 2017-06-23 DIAGNOSIS — R6881 Early satiety: Secondary | ICD-10-CM

## 2017-06-23 DIAGNOSIS — Z853 Personal history of malignant neoplasm of breast: Secondary | ICD-10-CM

## 2017-06-23 DIAGNOSIS — I43 Cardiomyopathy in diseases classified elsewhere: Secondary | ICD-10-CM

## 2017-06-23 DIAGNOSIS — I441 Atrioventricular block, second degree: Secondary | ICD-10-CM

## 2017-06-23 DIAGNOSIS — Z952 Presence of prosthetic heart valve: Secondary | ICD-10-CM

## 2017-06-23 DIAGNOSIS — I509 Heart failure, unspecified: Secondary | ICD-10-CM

## 2017-06-23 DIAGNOSIS — M549 Dorsalgia, unspecified: Secondary | ICD-10-CM

## 2017-06-23 DIAGNOSIS — K5900 Constipation, unspecified: Secondary | ICD-10-CM

## 2017-06-23 DIAGNOSIS — I160 Hypertensive urgency: Secondary | ICD-10-CM

## 2017-06-23 DIAGNOSIS — I2510 Atherosclerotic heart disease of native coronary artery without angina pectoris: Secondary | ICD-10-CM

## 2017-06-23 DIAGNOSIS — I5033 Acute on chronic diastolic (congestive) heart failure: Secondary | ICD-10-CM

## 2017-06-23 DIAGNOSIS — R001 Bradycardia, unspecified: Secondary | ICD-10-CM

## 2017-06-23 DIAGNOSIS — Z9114 Patient's other noncompliance with medication regimen: Secondary | ICD-10-CM

## 2017-06-23 DIAGNOSIS — R531 Weakness: Secondary | ICD-10-CM

## 2017-06-23 DIAGNOSIS — E785 Hyperlipidemia, unspecified: Secondary | ICD-10-CM

## 2017-06-23 DIAGNOSIS — R140 Abdominal distension (gaseous): Secondary | ICD-10-CM

## 2017-06-23 DIAGNOSIS — I350 Nonrheumatic aortic (valve) stenosis: Secondary | ICD-10-CM

## 2017-06-23 DIAGNOSIS — Z951 Presence of aortocoronary bypass graft: Secondary | ICD-10-CM

## 2017-06-23 LAB — BASIC METABOLIC PANEL
Anion gap: 8 (ref 5–15)
BUN: 25 mg/dL — ABNORMAL HIGH (ref 6–20)
CO2: 28 mmol/L (ref 22–32)
Calcium: 8.4 mg/dL — ABNORMAL LOW (ref 8.9–10.3)
Chloride: 101 mmol/L (ref 101–111)
Creatinine, Ser: 1.54 mg/dL — ABNORMAL HIGH (ref 0.44–1.00)
GFR calc Af Amer: 37 mL/min — ABNORMAL LOW (ref >60)
GFR calc non Af Amer: 32 mL/min — ABNORMAL LOW (ref >60)
Glucose, Bld: 165 mg/dL — ABNORMAL HIGH (ref 65–99)
Potassium: 4 mmol/L (ref 3.5–5.1)
Sodium: 137 mmol/L (ref 135–145)

## 2017-06-23 LAB — CBC
HCT: 31.8 % — ABNORMAL LOW (ref 36.0–46.0)
Hemoglobin: 10.1 g/dL — ABNORMAL LOW (ref 12.0–15.0)
MCH: 30.5 pg (ref 26.0–34.0)
MCHC: 31.8 g/dL (ref 30.0–36.0)
MCV: 96.1 fL (ref 78.0–100.0)
Platelets: 210 10*3/uL (ref 150–400)
RBC: 3.31 MIL/uL — ABNORMAL LOW (ref 3.87–5.11)
RDW: 13.4 % (ref 11.5–15.5)
WBC: 10.3 10*3/uL (ref 4.0–10.5)

## 2017-06-23 LAB — RESPIRATORY PANEL BY PCR

## 2017-06-23 LAB — GLUCOSE, CAPILLARY
Glucose-Capillary: 212 mg/dL — ABNORMAL HIGH (ref 65–99)
Glucose-Capillary: 148 mg/dL — ABNORMAL HIGH (ref 65–99)
Glucose-Capillary: 185 mg/dL — ABNORMAL HIGH (ref 65–99)
Glucose-Capillary: 185 mg/dL — ABNORMAL HIGH (ref 65–99)
Glucose-Capillary: 131 mg/dL — ABNORMAL HIGH (ref 65–99)

## 2017-06-23 LAB — TROPONIN I
Troponin I: 0.04 ng/mL (ref <0.03)
Troponin I: 0.06 ng/mL (ref <0.03)

## 2017-06-23 LAB — INFLUENZA PANEL BY PCR (TYPE A & B)
Influenza A By PCR: NEGATIVE
Influenza B By PCR: NEGATIVE

## 2017-06-23 MED ORDER — POLYETHYLENE GLYCOL 3350 17 G PO PACK
17.0000 g | PACK | Freq: Every day | ORAL | Status: DC
  Administered 2017-06-23 – 2017-06-26 (×4): 17 g via ORAL
  Filled 2017-06-23 (×4): qty 1

## 2017-06-23 MED ORDER — SENNOSIDES-DOCUSATE SODIUM 8.6-50 MG PO TABS: 1.0000 | ORAL_TABLET | Freq: Two times a day (BID) | ORAL | Status: DC

## 2017-06-23 MED ORDER — FUROSEMIDE 10 MG/ML IJ SOLN
60.0000 mg | Freq: Once | INTRAMUSCULAR | Status: AC
  Administered 2017-06-23: 60 mg via INTRAVENOUS
  Filled 2017-06-23: qty 6

## 2017-06-23 MED ORDER — AMLODIPINE BESYLATE 5 MG PO TABS
5.0000 mg | ORAL_TABLET | Freq: Every day | ORAL | Status: DC
  Administered 2017-06-24: 5 mg via ORAL
  Filled 2017-06-23: qty 1

## 2017-06-23 MED ORDER — SENNOSIDES-DOCUSATE SODIUM 8.6-50 MG PO TABS: 1.0000 | ORAL_TABLET | Freq: Every day | ORAL | Status: DC

## 2017-06-23 MED ORDER — FUROSEMIDE 10 MG/ML IJ SOLN
40.0000 mg | Freq: Two times a day (BID) | INTRAMUSCULAR | Status: DC
  Administered 2017-06-24: 40 mg via INTRAVENOUS
  Filled 2017-06-23: qty 4

## 2017-06-23 MED ORDER — SENNOSIDES-DOCUSATE SODIUM 8.6-50 MG PO TABS
1.0000 | ORAL_TABLET | Freq: Two times a day (BID) | ORAL | Status: AC
  Administered 2017-06-23 – 2017-06-24 (×3): 1 via ORAL
  Filled 2017-06-23 (×3): qty 1
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
## 2017-06-23 NOTE — Progress Notes (Addendum)
Subjective: [name] was seen resting in her bed this morning. She states she is feeling better and denies and chest pain or shortness of breath. She does endorse significant weakness. She endorse constipation with last bowl movement 5 days ago and some early satiety for the past several days. When asked about her not taking her medications; she states she not sure why she missed them. She has no questions or complaints this morning.  Objective:  Vital signs in last 24 hours: Vitals:   06/22/17 2300 06/23/17 0112 06/23/17 0442 06/23/17 1511  BP: (!) 143/55 (!) 128/47 102/67 138/81  Pulse: (!) 50 (!) 44 (!) 30 82  Resp: (!) 21 19    Temp:  99.4 F (37.4 C) 98.7 F (37.1 C) 98.7 F (37.1 C)  TempSrc:  Oral Oral Oral  SpO2: 100% 97% 99% 91%  Weight:  184 lb 8.4 oz (83.7 kg) 184 lb 1.4 oz (83.5 kg)   Height:  $Remove'5\' 8"'[id number]$  (1.727 m)     Physical Exam  Constitutional: She is oriented to person, place, and time. She appears well-developed and well-nourished. No distress.  HENT:  Head: Normocephalic and atraumatic.  Cardiovascular: Normal rate and regular rhythm.  Systolic Murmur  Pulmonary/Chest: Effort normal. No respiratory distress.  Bibasilar crackles  Abdominal: Soft. Bowel sounds are normal. She exhibits distension. There is no tenderness.  Musculoskeletal: She exhibits edema.  2-3+ edema bilateral LEs  Neurological: She is alert and oriented to person, place, and time.  Skin: Skin is warm and dry.   Assessment/Plan: 72 yo F with a past medical history of HTN, DM, PAD, Aortic Stenosis s/p repair in 2006 who presented to the ED with complaints of back pain, shortness of breath.   Diastolic HF Exacerbation: Presented with progressive dyspnea, worsening of chronic LE edema, elevated BNP, and interstitial edema on CXR in the setting of missing several days of medication.  > History of HOCM, on BB and CCB to help with diastolic filling > History of Aortic Stenosis s/p Valve replacement  in 2006  > Echo (1/61/09) Diastolic dysfunction, EF 60-45%.  > Trop Trend: 0.03>>0.04>>0.03>>0.06 (suspect demand ischemia) > EKG from overnight showing new Second Degree AV Block Type II.   > Patient reporting improvement of her breathing today - Cardiology consult, appreciate their recommnendations - Strict I/Os, supplemental O2 prn  - Net (- 276mL) (accuracy questionable) - Lasix $Remov'60mg'HwgXsx$  IV. Will be cautious in diuresis considering history of HOCM  First Degree AV Block Second Degree AV Block type II. Patient has a history of first degree AV on chart review, but not history of second degree AV block noted. - EKG showing First degree AV block with PR ~426ms, with Second Degree AV Block Type II - Cardiology consult, appreciate their recommnendations  Fever: Patient with recorded fever of 101.1 overnight 4/29. Patient asymptomic and did not feel subjective fever or chills. > Flu and RVP: Negative > WBC: 12>>10.3 (4/30) - No antibiotics at this time as no source identified - Blood cultures x 2 Pending - Continue to monitor vital signs - Tylenol PRN for fever  Hypertensive Urgency: Blood pressure 409W systolic on admission. In setting of recent medication nonadherence. Bradycardic on presentation. HR returned to normal and BP improved to 119J systolic today. - Hydralazine 25 mg TID, re-start home regimen as able   Abdominal Distension: Patient complaining of abdominal fullness on presentation. Possibly 2/2 to edema however, upon chart review PCP describes alternating periods of constipation and diarrhea, though no  other abdominal symptoms. No hx of hernia reported.   > KUB showed nonobstructed bowl-gas pattern - States last bowl movement 5 days ago - [place]-S  Weakness, Hx of CVA Back Pain s/p Mechanical Fall:  History of CVA with recent admission noted worsening of lower extremity weakness.  She was discharged with home health therapy and other resources with difficulties noted  regarding insurance coverage of SNF. Reported mechanical fall with resulting thoracic back pain, tenderness to palpation of left lower thoracic paraspinal musculature. Likely musculoskeletal. Pain improving - Xrays of C and T Spine were negative for acute osseous abnormalities - PT/OT Eval and treat  Diabetes: On metformin $RemoveBefo'1000mg'zPhwrmtfCTN$  BID, Levemir 24units qhs, Novolog 10units TID - Lantus 10U qhs - Novolog 4U TID - SSI-M  Chronic Kidney Disease History of CKD II, most recent creatinine values are 1.1-1.2, currently 1.54.  May represent a mild AKI. May be secondary to hypoperfusion in the setting of heart failure exacerbation. Will monitor with diuresis, hold ACE inhibitor. - BMP in AM  FEN: Heart diet VTE ppx: Lovenox Code Status: Full  Dispo: Anticipated discharge in approximately 1-3 day(s).   Neva Seat, MD 06/23/2017, 5:28 PM Pager: [phone]

## 2017-06-23 NOTE — Progress Notes (Addendum)
Date: 06/23/2017  Patient name: [name]  Medical record number: [id number]  Date of birth: 1945-08-03   I saw and evaluated the patient. I reviewed the resident's note and I agree with the resident's findings and plan as documented in the resident's note.  Chief Complaint(s): Shortness of breath, back pain  History - key components related to admission:  Please see resident note for details.  Briefly, [name] is a 72 yo woman with complex history including HTN, HLD, T2DM, CAD s/p CABG 2006, AS s/p bioprosthetic valve 2006, HOCM, CVA, CKD II, breast adenocarcinoma 1995, and B12 deficiency presenting with a few days of back pain after a fall and accompanying shortness of breath. She slipped on the bathroom floor a few days ago and fell back, hitting her midback as she fell. Since then, she has had pain in her midback area. She has also had difficulty breathing, especially with exertion. It is difficult to tell if the pain is limiting her ability to take a deep breath. She notes the breathing difficulty may have started before the fall.   She denies any fevers or other illness. Her daughter reported on admission that she may have missed her medications for a few days. She has had some fullness in her belly and has not had a BM in a few days.   She was admitted to our team earlier this month for generalized weakness and hypertensive urgency. TTE at that admission noted normal EF and grade 2 diastolic dysfunction, but did not comment on her bioprosthetic aortic valve in detail nor her HOCM. She had hoped to be discharged to SNF, but was unable to get it covered so worked with home health after discharge.   Physical Exam - key components related to admission:  Vitals:   06/22/17 2201 06/22/17 2300 06/23/17 0112 06/23/17 0442  BP:  (!) 143/55 (!) 128/47 102/67  Pulse:  (!) 50 (!) 44 (!) 30  Resp:  (!) 21 19   Temp: (!) 101.1 F (38.4 C)  99.4 F (37.4 C) 98.7 F (37.1 C)  TempSrc:  Rectal  Oral Oral  SpO2:  100% 97% 99%  Weight:   184 lb 8.4 oz (83.7 kg) 184 lb 1.4 oz (83.5 kg)  Height:   '5\' 8"'$  (1.727 m)     General: Alert, pleasant, not in distress HEENT: Moist mucous membranes Neck: No JVD, no lymphadenopathy Cardiovascular: Regular rate and rhythm, 3/6 systolic murmur at RUSB Pulmonary: Crackles in bilateral lower 1/3 of both lungs Abdominal: Distended, soft, nontender, normal bowel sounds Extremities: Warm, 2+ LE edema Skin: No significant rashes or lesions Neuro: Alert and oriented, speech normal  Significant test results: Troponin 0.04 x 3 BNP 1160 Cr 1.4 (baseline 1.2) Flu neg RVP pending  CXR: Marked change in the appearance of the chest since the previous study most compatible with pulmonary interstitial edema. One cannot exclude bilateral interstitial pneumonia in the appropriate clinical Setting.  XR T-spine:  1. No acute osseous finding. 2. Remote T11 compression fracture.  XR C-spine: 1. Suboptimal visualization of C7 and cervicothoracic junction. 2. Minimal degenerative change at C5-C6. No definite radiographic evidence for acute osseous abnormality involving imaged portions of the cervical spine.  EKG: sinus rhythm with 1st degree AV block and concern for Mobitz II 2nd degree AV block (difficult to assess as P waves are buried in T waves), LBBB  Assessment and Plan: I have seen and evaluated the patient as outlined in the resident's note. I agree with  the formulated Assessment and Plan as detailed in the resident's note, with the following changes:   Principal Problem:   Acute exacerbation of CHF (congestive heart failure) ([place]) Active Problems:   Diabetes mellitus with renal manifestations, controlled ([place])   Aortic stenosis s/p Tissure AVR 2006   Hypertension   CKD (chronic kidney disease) stage 2, GFR 60-89 ml/min   HOCM (hypertrophic obstructive cardiomyopathy) (HCC)   CHF exacerbation ([place])   1. Shortness of breath:  appears likely due to acute HFpEF, possibly in the context of missing her medications for a few days. Concern for worsening AS, but bioprosthetic valve appeared to be functioning relatively well on recent TTE 2 weeks ago with moderate AS. Unfortunately, HOCM makes diuresis treacherous, will consult cardiology for assistance - discuss with cardiology balance of need for diuresis with HOCM reliance on preload - consider repeat TTE, although just done 2 weeks ago  2. Bradycardia with Mobitz II AV block: evident on my review of EKG and telemetry, although difficult to interpret with long PR interval,  - discuss with cardiology risk benefit of beta blockers given HOCM and Mobitz II - cont telemetry  3. Fever: unclear etiology, flu negative, RVP and blood culture pending. No active signs of infection, will continue to monitor for now  4. Back pain: no acute fracture, likely MSK injury, supportive care  5. CKD: appears close to baseline, will trend renal function and ensure meds renally dosed  6. Weakness and falls: cardiac optimization as above, PT and OT eval  7. Abdominal distension: reports constipation, will start bowel regimen, but early satiety raises concern for abdominal mass, low threshold to obtain CT Abd/Pelvis  Oda Kilts, MD 4/30/201911:42 AM

## 2017-06-23 NOTE — Clinical Social Work Note (Signed)
Clinical Social Work Assessment  Patient Details  Name: [name] MRN: [id number] Date of Birth: 1946-01-05  Date of referral:  06/23/17               Reason for consult:  Facility Placement, Discharge Planning                Permission sought to share information with:  Facility Sport and exercise psychologist, Family Supports Permission granted to share information::  Yes, Verbal Permission Granted  Name::     Engineer, manufacturing::  SNFs  Relationship::  daughter  Contact Information:     Housing/Transportation Living arrangements for the past 2 months:  Single Family Home Source of Information:  Adult Children, Patient Patient Interpreter Needed:  None Criminal Activity/Legal Involvement Pertinent to Current Situation/Hospitalization:  No - Comment as needed Significant Relationships:  Adult Children Lives with:  Self Do you feel safe going back to the place where you live?  Yes Need for family participation in patient care:  Yes (Comment)  Care giving concerns: Patient from home alone. Daughter lives nearby. PT has not yet assessed, but patient has had falls at home; family wants SNF.   Social Worker assessment / plan: CSW met with patient and daughter, [name], at bedside, at daughter's request. Daughter indicated she and her sisters are concerned about patient continuing to live at home, as patient is weak and unable to manage well on her own. Patient was at [facility] with [facility], but had a fall.   Daughters have been looking into long term care options and have helped patient to apply for Medicaid. Daughter prefers [facility] for rehab and is hopeful patient may have Medicaid approved soon and could transition to long term care at [facility] or another facility. Daughter agreeable to fax out to other facilities for backups.   CSW spoke to MD and requested PT/OT evaluations. CSW to follow and support with disposition planning.  Employment status:  Retired Forensic scientist:   Medicare PT Recommendations:  Not assessed at this time [place] / Referral to community resources:  [place]  Patient/Family's Response to care: Patient and family appreciative of care.  Patient/Family's Understanding of and Emotional Response to Diagnosis, Current Treatment, and Prognosis: Patient and family with understanding of patient's condition and agreeable to short term SNF. Family also hopeful for long term care placement.  Emotional Assessment Appearance:  Appears stated age Attitude/Demeanor/Rapport:  Engaged Affect (typically observed):  Accepting, Calm Orientation:  Oriented to Self, Oriented to Place, Oriented to  Time, Oriented to Situation Alcohol / Substance use:  Not Applicable Psych involvement (Current and /or in the community):  No (Comment)  Discharge Needs  Concerns to be addressed:  Discharge Planning Concerns, Care Coordination Readmission within the last 30 days:  Yes Current discharge risk:  Physical Impairment, Lives alone Barriers to Discharge:  Continued Medical Work up   Estanislado Emms, LCSW 06/23/2017, 2:28 PM

## 2017-06-23 NOTE — Progress Notes (Addendum)
Patient febrile to 101.1. UA obtained and was negative for infection. CXR with pulmonary interstitial edema, but bilateral interstitial pneumonia cannot be excluded.Went to bedside to evaluate for other possible source of infection. Patient states she has been experiencing dry cough since this morning, rhinorrhea, and headache. She denies sore throat. Patient denies wounds or skin sores. States her shortness of breath has improved since arrival to the ED. Patient tolerating room air, mid 90s on room air when weaned  Blood pressure (!) 143/55, pulse (!) 50, temperature (!) 101.1 F (38.4 C), temperature source Rectal, resp. rate (!) 21, height $RemoveBe'5\' 8"'PYwLVdqwJ$  (1.727 m), SpO2 100 % on 5 L [place]. PE: General: AOx3, diaphoretic, nontoxic appearing.  HEENT: [place]/AT, no scleral icterus, no facial TTP, poor dentition but not signs of acute infectious process  Cardiac: RRR, No R/M/G appreciated Pulm: mildly tachypneic, bibasilar crackles  Ext: +2 pitting edema b/l lower extremities  Skin: skin intact, no wounds, ulcers, or abrasions identified   Plan: -Tylenol PRN for fever -Blood cultures x 2  -RVP, influenza panel  -Will hold of on abx at this time as do not have a definitive source -will continue to monitor vital signs

## 2017-06-23 NOTE — Progress Notes (Signed)
MD on call notified of pt's hr dropping to the 30s non-sustaining & asymptomatic while sleeping. New orders for ekg. Will continue to monitor the pt. Hoover Brunette, RN

## 2017-06-23 NOTE — Plan of Care (Signed)
  Problem: Education: Goal: Knowledge of General Education information will improve Outcome: Completed/Met

## 2017-06-24 DIAGNOSIS — Z8679 Personal history of other diseases of the circulatory system: Secondary | ICD-10-CM

## 2017-06-24 DIAGNOSIS — I440 Atrioventricular block, first degree: Secondary | ICD-10-CM

## 2017-06-24 DIAGNOSIS — E1151 Type 2 diabetes mellitus with diabetic peripheral angiopathy without gangrene: Secondary | ICD-10-CM

## 2017-06-24 DIAGNOSIS — E1122 Type 2 diabetes mellitus with diabetic chronic kidney disease: Secondary | ICD-10-CM

## 2017-06-24 DIAGNOSIS — N179 Acute kidney failure, unspecified: Secondary | ICD-10-CM

## 2017-06-24 DIAGNOSIS — Z79899 Other long term (current) drug therapy: Secondary | ICD-10-CM

## 2017-06-24 DIAGNOSIS — I421 Obstructive hypertrophic cardiomyopathy: Secondary | ICD-10-CM

## 2017-06-24 DIAGNOSIS — I130 Hypertensive heart and chronic kidney disease with heart failure and stage 1 through stage 4 chronic kidney disease, or unspecified chronic kidney disease: Principal | ICD-10-CM

## 2017-06-24 DIAGNOSIS — Z794 Long term (current) use of insulin: Secondary | ICD-10-CM

## 2017-06-24 DIAGNOSIS — R509 Fever, unspecified: Secondary | ICD-10-CM

## 2017-06-24 DIAGNOSIS — N182 Chronic kidney disease, stage 2 (mild): Secondary | ICD-10-CM

## 2017-06-24 DIAGNOSIS — Z8673 Personal history of transient ischemic attack (TIA), and cerebral infarction without residual deficits: Secondary | ICD-10-CM

## 2017-06-24 DIAGNOSIS — I5031 Acute diastolic (congestive) heart failure: Secondary | ICD-10-CM

## 2017-06-24 DIAGNOSIS — Z9181 History of falling: Secondary | ICD-10-CM

## 2017-06-24 LAB — BASIC METABOLIC PANEL
Anion gap: 7 (ref 5–15)
BUN: 24 mg/dL — ABNORMAL HIGH (ref 6–20)
CO2: 30 mmol/L (ref 22–32)
Calcium: 8.6 mg/dL — ABNORMAL LOW (ref 8.9–10.3)
Chloride: 103 mmol/L (ref 101–111)
Creatinine, Ser: 1.4 mg/dL — ABNORMAL HIGH (ref 0.44–1.00)
GFR calc Af Amer: 42 mL/min — ABNORMAL LOW (ref >60)
GFR calc non Af Amer: 36 mL/min — ABNORMAL LOW (ref >60)
Glucose, Bld: 127 mg/dL — ABNORMAL HIGH (ref 65–99)
Potassium: 4.2 mmol/L (ref 3.5–5.1)
Sodium: 140 mmol/L (ref 135–145)

## 2017-06-24 LAB — GLUCOSE, CAPILLARY
Glucose-Capillary: 124 mg/dL — ABNORMAL HIGH (ref 65–99)
Glucose-Capillary: 156 mg/dL — ABNORMAL HIGH (ref 65–99)
Glucose-Capillary: 104 mg/dL — ABNORMAL HIGH (ref 65–99)
Glucose-Capillary: 215 mg/dL — ABNORMAL HIGH (ref 65–99)

## 2017-06-24 MED ORDER — PAROXETINE HCL 20 MG PO TABS
20.0000 mg | ORAL_TABLET | Freq: Every day | ORAL | Status: DC
  Administered 2017-06-24 – 2017-06-26 (×3): 20 mg via ORAL
  Filled 2017-06-24 (×3): qty 1

## 2017-06-24 MED ORDER — ASPIRIN EC 81 MG PO TBEC
81.0000 mg | DELAYED_RELEASE_TABLET | Freq: Every day | ORAL | Status: DC
  Administered 2017-06-24 – 2017-06-26 (×3): 81 mg via ORAL
  Filled 2017-06-24 (×3): qty 1

## 2017-06-24 MED ORDER — AMLODIPINE BESYLATE 10 MG PO TABS
10.0000 mg | ORAL_TABLET | Freq: Every day | ORAL | Status: DC
  Administered 2017-06-25 – 2017-06-26 (×2): 10 mg via ORAL
  Filled 2017-06-24 (×2): qty 1

## 2017-06-24 MED ORDER — ATORVASTATIN CALCIUM 40 MG PO TABS: 40.0000 mg | ORAL_TABLET | Freq: Every day | ORAL | Status: DC

## 2017-06-24 MED ORDER — ATORVASTATIN CALCIUM 80 MG PO TABS
80.0000 mg | ORAL_TABLET | Freq: Every day | ORAL | Status: DC
  Administered 2017-06-24 – 2017-06-25 (×2): 80 mg via ORAL
  Filled 2017-06-24 (×2): qty 1

## 2017-06-24 MED ORDER — SENNOSIDES-DOCUSATE SODIUM 8.6-50 MG PO TABS
1.0000 | ORAL_TABLET | Freq: Two times a day (BID) | ORAL | Status: DC | PRN
  Filled 2017-06-24: qty 2

## 2017-06-24 MED ORDER — BISACODYL 10 MG RE SUPP: 10.0000 mg | Freq: Every day | RECTAL | Status: DC | PRN

## 2017-06-24 MED ORDER — FUROSEMIDE 40 MG PO TABS
40.0000 mg | ORAL_TABLET | Freq: Two times a day (BID) | ORAL | Status: DC
  Administered 2017-06-24 – 2017-06-26 (×4): 40 mg via ORAL
  Filled 2017-06-24 (×4): qty 1

## 2017-06-24 MED ORDER — HYDRALAZINE HCL 25 MG PO TABS
25.0000 mg | ORAL_TABLET | Freq: Two times a day (BID) | ORAL | Status: DC
  Administered 2017-06-24 (×2): 25 mg via ORAL
  Filled 2017-06-24 (×3): qty 1

## 2017-06-24 NOTE — NC FL2 (Signed)
[place] LEVEL OF CARE SCREENING TOOL     IDENTIFICATION  Patient Name: [name] Birthdate: [birth year] Sex: female Admission Date (Current Location): 06/22/2017  [facility] and [place] Number:  Herbalist and Address:  [facility]. [facility], [place] [address], [place], [place] [address]      Provider Number: [id number]  Attending Physician Name and Address:  Oda Kilts, MD  Relative Name and Phone Number:  Elberta Spaniel, daughter, [phone]    Current Level of Care: Hospital Recommended Level of Care: [place] Prior Approval Number:    Date Approved/Denied:   PASRR Number: [id number]  Discharge Plan: SNF    Current Diagnoses: Patient Active Problem List   Diagnosis Date Noted  . Acute exacerbation of CHF (congestive heart failure) ([place]) 06/23/2017  . CHF exacerbation ([place]) 06/22/2017  . Peripheral arterial disease ([place]) 06/18/2017  . B12 deficiency 05/01/2017  . Bilateral lower extremity edema 11/08/2015  . Aortic atherosclerosis ([place]) 06/07/2014  . Abnormality of gait 05/29/2012  . HOCM (hypertrophic obstructive cardiomyopathy) ([place]) 06/11/2011  . CKD (chronic kidney disease) stage 2, GFR 60-89 ml/min 06/03/2011  . Routine health maintenance 06/10/2010  . Hyperlipidemia   . Refusal of blood transfusions as patient is Jehovah's Witness   . History of adenocarcinoma of breast   . Osteoporosis   . Status post CVA   . Aortic stenosis s/p Tissure AVR 2006   . CAD (coronary artery disease)   . Hypertension   . Depression 01/23/2006  . Diabetes mellitus with renal manifestations, controlled ([place]) 03/25/1990    Orientation RESPIRATION BLADDER Height & Weight     Self, Time, Situation, Place  O2(nasal cannula 1-3 L) Incontinent, External catheter Weight: 182 lb 5.1 oz (82.7 kg) Height:  $Remove'5\' 8"'hiBXCYT$  (172.7 cm)  BEHAVIORAL SYMPTOMS/MOOD NEUROLOGICAL BOWEL NUTRITION STATUS      Incontinent Diet(please see  DC summary)  AMBULATORY STATUS COMMUNICATION OF NEEDS Skin   Limited Assist Verbally Normal                       Personal Care Assistance Level of Assistance  Bathing, Feeding, Dressing Bathing Assistance: Limited assistance(mod to min assist) Feeding assistance: Independent Dressing Assistance: Limited assistance(mod to min assist)     Functional Limitations Info  Sight, Hearing, Speech Sight Info: Adequate Hearing Info: Adequate Speech Info: Adequate    SPECIAL CARE FACTORS FREQUENCY  PT (By licensed PT), OT (By licensed OT)     PT Frequency: 5x/week OT Frequency: 5x/week            Contractures Contractures Info: Not present    Additional Factors Info  Code Status, Allergies, Insulin Sliding Scale Code Status Info: Full Allergies Info: No Known Allergies   Insulin Sliding Scale Info: novolog 3x/day with meals and at bedtime; lantus at bedtime       Current Medications (06/24/2017):  This is the current hospital active medication list Current Facility-Administered Medications  Medication Dose Route Frequency Provider Last Rate Last Dose  . acetaminophen (TYLENOL) tablet 650 mg  650 mg Oral Q6H PRN Kalman Shan Ratliff, DO   650 mg at 06/24/17 1306   Or  . acetaminophen (TYLENOL) suppository 650 mg  650 mg Rectal Q6H PRN Valinda Party, DO      . [START ON 06/25/2017] amLODipine (NORVASC) tablet 10 mg  10 mg Oral Daily Dorothy Spark, MD      . bisacodyl (DULCOLAX) suppository 10 mg  10 mg Rectal Daily PRN Nedrud, Larena Glassman, MD      . enoxaparin (LOVENOX) injection 40 mg  40 mg Subcutaneous Q24H Hoffman, Jessica Ratliff, DO   40 mg at 06/24/17 1543  . furosemide (LASIX) tablet 40 mg  40 mg Oral BID Dorothy Spark, MD      . hydrALAZINE (APRESOLINE) injection 5 mg  5 mg Intravenous PRN Kalman Shan Ratliff, DO      . hydrALAZINE (APRESOLINE) tablet 25 mg  25 mg Oral BID Thomasene Ripple, MD   25 mg at 06/24/17 1543  . insulin aspart  (novoLOG) injection 0-15 Units  0-15 Units Subcutaneous TID WC Hoffman, Jessica Ratliff, DO   3 Units at 06/24/17 1300  . insulin aspart (novoLOG) injection 0-5 Units  0-5 Units Subcutaneous QHS Valinda Party, DO   2 Units at 06/22/17 2323  . insulin aspart (novoLOG) injection 4 Units  4 Units Subcutaneous TID WC Hoffman, Jessica Ratliff, DO   4 Units at 06/24/17 1301  . insulin glargine (LANTUS) injection 10 Units  10 Units Subcutaneous QHS Valinda Party, DO   10 Units at 06/23/17 2306  . nitroGLYCERIN (NITROGLYN) 2 % ointment 1 inch  1 inch Topical Q6H Lacretia Leigh, MD   1 inch at 06/24/17 1301  . polyethylene glycol (MIRALAX / GLYCOLAX) packet 17 g  17 g Oral Daily Alphonzo Grieve, MD   17 g at 06/24/17 0954  . senna-docusate (Senokot-S) tablet 1-2 tablet  1-2 tablet Oral BID PRN Thomasene Ripple, MD         Discharge Medications: Please see discharge summary for a list of discharge medications.  Relevant Imaging Results:  Relevant Lab Results:   Additional Information SSN: [id number]  Audrey Emms, LCSW

## 2017-06-24 NOTE — Progress Notes (Signed)
  Date: 06/24/2017  Patient name: [name]  Medical record number: [id number]  Date of birth: [birth year]   I have seen and evaluated this patient and I have discussed the plan of care with the house staff. Please see their note for complete details. I concur with their findings with the following additions/corrections: Diuretics being adjusted by cardiology. Pt responding, 2 lb weight loss since yesterday but still crackles in bases B. At my appt end of April, her ABI were suggestive of PAD B and she had claudication sxs B. Would suggest arterial dopplers while inpt. We need to resume her ASA and statin, but increase it to high intensity. She is a non smoker. Despite her chronic medical issues, if found to have PAD, I believe that she would be a candidate for and accept a vascular intervention.   [name], MD 06/24/2017, 5:19 PM

## 2017-06-24 NOTE — Progress Notes (Signed)
Subjective:  Patient seen sitting comfortably in bed this AM wearing [place] in no acute distress. [place] weaned during interview with O2 saturations consistently >95% on room air and no distress. Patient notes interval improvement in strength and breathing since yesterday, stating that she was able to get up to go to the bathroom in her room without experiencing shortness of breath.   Objective:  Vital signs in last 24 hours: Vitals:   06/23/17 0442 06/23/17 1511 06/23/17 2013 06/24/17 0624  BP: 102/67 138/81 (!) 145/84 (!) 181/86  Pulse: (!) 30 82 93 80  Resp:   20 16  Temp: 98.7 F (37.1 C) 98.7 F (37.1 C) 99.9 F (37.7 C) 98.5 F (36.9 C)  TempSrc: Oral Oral Oral Oral  SpO2: 99% 91% 92% 100%  Weight: 184 lb 1.4 oz (83.5 kg)     Height:       Physical Exam  Constitutional: She appears well-developed and well-nourished. No distress.  Eyes: Conjunctivae and EOM are normal.  Cardiovascular: Normal rate, regular rhythm and intact distal pulses. Exam reveals no friction rub.  No murmur heard. Respiratory:  Speaking in full sentences without accessory muscle use or nasal flaring. Poor inspiratory effort. No wheezing. Bibasilar crackles to mid back fields bilaterally.  GI: Soft. She exhibits no distension. There is no tenderness. There is no rebound.  Musculoskeletal: She exhibits edema (1+ pitting edema to mid shins bilaterally). She exhibits no tenderness (with bilateral lower extremities).  Skin: Skin is warm and dry. No rash noted. She is not diaphoretic. No erythema.   Assessment/Plan:  Principal Problem:   Acute exacerbation of CHF (congestive heart failure) (HCC) Active Problems:   Diabetes mellitus with renal manifestations, controlled ([place])   Aortic stenosis s/p Tissure AVR 2006   Hypertension   CKD (chronic kidney disease) stage 2, GFR 60-89 ml/min   HOCM (hypertrophic obstructive cardiomyopathy) (HCC)   CHF exacerbation ([place])  72 yo F with a past medical history of HTN,  DM, PAD, Aortic Stenosis s/p repair in 2006 who presented to the ED with complaints of  shortness of breath and was found to have acute CHF exacerbation. She was admitted to the internal medicine teaching service with cardiology consulting. The specific problems addressed during admission are as follows.  Diastolic HF Exacerbation (LVEF 20-65%, G2DD, Hx of HOCM): Presented with progressive dyspnea, worsening of chronic LE edema, elevated BNP, and interstitial edema on CXR in the setting of missing several days of medication. Patient clinically improving with diuresis and yesterday's output -1.6L (net -280 mL). Currently 182 lbs, down from 184 lbs yesterday.  -Cardiology consult, appreciate their recommnendations -Strict I/Os, daily weight -Transition to oral lasix 40 mg BID today   Acute on Chronic Kidney Disease, stage II: Baseline Cr 1.1-1.2 per chart review. Patient's Cr elevated to 1.54 on admission and currently down trending with diuresis suggesting that  -Hold nephrotoxic medications -Daily BMP while undergoing diuresis  HTN: BP elevated to 170s/140s today. -Increase amlodipine to 10 mg today -Start Hydralazine 25 mg BID while holding lisinopril given AKI as above  AV Block (known 1st deg, possible 2nd degree, type II): Noted on 06/23/2017. No acute events overnight -Cardiology consulted recommendations appreciated  Hx of CVA and falls: Patient's trauma workup negative for acute fractures on admission. Pain back pain continues to improve.  - PT/OT Eval and treat  Isolated fever: Patient with recorded fever of 101.1 overnight 4/29. Patient asymptomic and did not feel subjective fever or chills. Infectious workup, including blood  cultures, RVP, influenza A/B, and CBC to examine for leukocytosis within normal limits. No indication for antibiotics at this time. -Continue to monitor -Blood cultures NGTD -Tylenol PRN for fever  Diabetes: On metformin $RemoveBefo'1000mg'DJRlqTVdRin$  BID, Levemir 24units qhs,  Novolog 10units TID as outpatient. Recent CBGs appropriate. -CBG monitoring TID and qhs -Lantus 10U qhs -Novolog 4U TID and SSI-M  FEN/GI: -Heart Healthy/Carb Modified diet  -No IVF, replace electrolytes as needed -Senna 1-2 tablets BID, dulcolax suppository, and miralax PRN for constipation  VTE Prophylaxis: Lovenox daily Code Status: Full  Dispo: Anticipated discharge in approximately 1-2 day(s).   Thomasene Ripple, MD 06/24/2017, 6:52 AM Pager: [phone]

## 2017-06-24 NOTE — Evaluation (Signed)
Occupational Therapy Evaluation Patient Details Name: [name] MRN: [id number] DOB: Jan 12, 1946 Today's Date: 06/24/2017    History of Present Illness 72 yo with PMH of CAD s/p CABG in 2006, T2DM, HLD, HTN, aortic stenosis s/p porcine valve in 2006, prior CVA, and CKD stage 3 who presents for evaluation of weakness and jitteriness.  She was admitted for hypertensive urgency.    Clinical Impression   PTA, pt had assistance from her daughter for bathing and LB dressing tasks. Pt presenting to OT evaluation with significantly limited activity tolerance for ADL, generalized weakness, and slow processing impacting her ability to participate in ADL at [facility]. Pt currently requiring total assist for LB ADL and toileting hygiene, min assist for seated UB ADL, and mod assist +2 for toilet transfers. Feel at current functional level, pt will need SNF level rehabilitation post-acute D/C to maximize independence and safety with ADL and functional mobility prior to returning home.     Follow Up Recommendations  SNF;Supervision/Assistance - 24 hour(if pt declines, recommend HHOT)    Equipment Recommendations  Other (comment)(defer to next venue of care)    Recommendations for Other Services       Precautions / Restrictions Precautions Precautions: Fall Restrictions Weight Bearing Restrictions: No      Mobility Bed Mobility Overal bed mobility: Needs Assistance Bed Mobility: Sit to Supine       Sit to supine: Max assist   General bed mobility comments: Assist for BLE back to bed.   Transfers Overall transfer level: Needs assistance Equipment used: Rolling walker (2 wheeled) Transfers: Sit to/from Stand Sit to Stand: Mod assist;+2 safety/equipment         General transfer comment: Mod assist to power up from recliner and BSC.     Balance Overall balance assessment: Needs assistance Sitting-balance support: No upper extremity supported;Feet supported Sitting balance-Leahy  Scale: Fair     Standing balance support: Bilateral upper extremity supported;During functional activity Standing balance-Leahy Scale: Poor Standing balance comment: Relies on BUE support.                            ADL either performed or assessed with clinical judgement   ADL Overall ADL's : Needs assistance/impaired Eating/Feeding: Set up;Sitting   Grooming: Min guard;Sitting   Upper Body Bathing: Sitting;Minimal assistance   Lower Body Bathing: Total assistance;Sit to/from stand   Upper Body Dressing : Minimal assistance;Sitting   Lower Body Dressing: Total assistance;Sit to/from stand   Toilet Transfer: Moderate assistance;Ambulation;+2 for safety/equipment;RW(taking approximately 10 steps)   Toileting- Clothing Manipulation and Hygiene: Total assistance;Sit to/from stand Toileting - Clothing Manipulation Details (indicate cue type and reason): Pt with bowel movement and urination on standing on initiation of ambulation to bathroom.      Functional mobility during ADLs: Moderate assistance;+2 for safety/equipment;Rolling walker General ADL Comments: Pt with poor balance and severely diminished activity tolerance for ADL.      Vision Patient Visual Report: No change from baseline Vision Assessment?: No apparent visual deficits     Perception     Praxis      Pertinent Vitals/Pain Pain Assessment: Faces Faces Pain Scale: Hurts little more Pain Location: abdominal Pain Descriptors / Indicators: Sore Pain Intervention(s): Monitored during session;Repositioned     Hand Dominance     Extremity/Trunk Assessment Upper Extremity Assessment Upper Extremity Assessment: Overall WFL for tasks assessed   Lower Extremity Assessment Lower Extremity Assessment: Overall WFL for tasks assessed   Cervical /  Trunk Assessment Cervical / Trunk Assessment: Kyphotic   Communication Communication Communication: No difficulties   Cognition Arousal/Alertness:  Awake/alert Behavior During Therapy: WFL for tasks assessed/performed Overall Cognitive Status: Impaired/Different from baseline Area of Impairment: Problem solving                             Problem Solving: Slow processing General Comments: Pt requiring increased time to process information.   General Comments       Exercises Exercises: General Lower Extremity General Exercises - Lower Extremity Ankle Circles/Pumps: AROM;Both;10 reps;Supine Heel Slides: AAROM;Both;10 reps;Supine   Shoulder Instructions      Home Living Family/patient expects to be discharged to:: Skilled nursing facility Living Arrangements: Alone Available Help at Discharge: Family;Available PRN/intermittently(near 24 hour assist) Type of Home: Apartment Home Access: Level entry     Home Layout: One level     Bathroom Shower/Tub: Teacher, early years/pre: Standard Bathroom Accessibility: Yes   Home Equipment: Walker - 2 wheels;Shower seat;Bedside commode;Wheelchair - manual          Prior Functioning/Environment Level of Independence: Needs assistance  Gait / Transfers Assistance Needed: Ambulates with RW. Uses BSC during the night. Wheelchair for community mobility.  ADL's / Homemaking Assistance Needed: Daughter assists with tub transfers, does cooking for pt and assists with LB dressing tasks.    Comments: Was active with [place] services (PT/OT/RN) on admission.        OT Problem List: Decreased strength;Decreased range of motion;Decreased activity tolerance;Impaired balance (sitting and/or standing);Decreased safety awareness;Decreased knowledge of use of DME or AE;Decreased knowledge of precautions;Cardiopulmonary status limiting activity;Decreased cognition      OT Treatment/Interventions: Self-care/ADL training;Therapeutic exercise;Energy conservation;DME and/or AE instruction;Therapeutic activities;Patient/family education;Balance training    OT Goals(Current goals  can be found in the care plan section) Acute Rehab OT Goals Patient Stated Goal: get rehab OT Goal Formulation: With patient Time For Goal Achievement: 07/08/17 Potential to Achieve Goals: Good ADL Goals Pt Will Perform Grooming: with supervision;sitting Pt Will Perform Upper Body Dressing: with supervision;sitting Pt Will Perform Lower Body Dressing: sit to/from stand;with max assist Pt Will Transfer to Toilet: with min assist;ambulating;bedside commode Pt Will Perform Toileting - Clothing Manipulation and hygiene: with min assist;sit to/from stand  OT Frequency: Min 2X/week   Barriers to D/C:            Co-evaluation              AM-PAC PT "6 Clicks" Daily Activity     Outcome Measure Help from another person eating meals?: A Little Help from another person taking care of personal grooming?: A Little Help from another person toileting, which includes using toliet, bedpan, or urinal?: Total Help from another person bathing (including washing, rinsing, drying)?: A Lot Help from another person to put on and taking off regular upper body clothing?: A Little Help from another person to put on and taking off regular lower body clothing?: Total 6 Click Score: 13   End of Session Equipment Utilized During Treatment: Gait belt;Rolling walker Nurse Communication: Mobility status(RN present during session)  Activity Tolerance: Patient tolerated treatment well Patient left: in bed;with call bell/phone within reach;with bed alarm set  OT Visit Diagnosis: Other abnormalities of gait and mobility (R26.89);Muscle weakness (generalized) (M62.81);Other symptoms and signs involving cognitive function                Time: 1401-1433 OT Time Calculation (min): 32 min Charges:  OT General Charges $OT Visit: 1 Visit OT Evaluation $OT Eval Moderate Complexity: 1 Mod OT Treatments $Self Care/Home Management : 8-22 mins G-Codes:     Norman Herrlich, MS OTR/L  Pager: [place]  A Luara Faye 06/24/2017, 3:42 PM

## 2017-06-24 NOTE — Evaluation (Signed)
Physical Therapy Evaluation Patient Details Name: [name] MRN: [id number] DOB: Jun 29, 1945 Today's Date: 06/24/2017   History of Present Illness  72 yo with PMH of CAD s/p CABG in 2006, T2DM, HLD, HTN, aortic stenosis s/p porcine valve in 2006, prior CVA, and CKD stage 3 who presents for evaluation of weakness and jitteriness.  She was admitted for hypertensive urgency.   Clinical Impression  Pt admitted with above diagnosis. Pt currently with functional limitations due to the deficits listed below (see PT Problem List). Pt was able to ambulate in room with RW with cues and assist for posture needing min to mod assist of 2 persons for safety.  Will benefit from SNF for therapy.   Pt will benefit from skilled PT to increase their independence and safety with mobility to allow discharge to the venue listed below.      Follow Up Recommendations SNF;Supervision/Assistance - 24 hour    Equipment Recommendations  None recommended by PT    Recommendations for Other Services       Precautions / Restrictions Precautions Precautions: Fall Restrictions Weight Bearing Restrictions: No      Mobility  Bed Mobility Overal bed mobility: Needs Assistance             General bed mobility comments: NT - sitting on EOB on arrival. HAd just come out of bathroom with nursing assist and urinated on floor at bed.  NT came in and assisted pt with cleaning.    Transfers Overall transfer level: Needs assistance Equipment used: Rolling walker (2 wheeled) Transfers: Sit to/from Stand Sit to Stand: Mod assist         General transfer comment: Mod assist to power up to standing from elevated bed surface.   Ambulation/Gait Ambulation/Gait assistance: Min assist;+2 safety/equipment;Mod assist Ambulation Distance (Feet): 50 Feet Assistive device: Rolling walker (2 wheeled) Gait Pattern/deviations: Step-through pattern;Decreased stride length;Trunk flexed Gait velocity: very slow Gait  velocity interpretation: <1.8 ft/sec, indicate of risk for recurrent falls General Gait Details: verbal cues for postureas pt maintains slight trunk flexion, decreased foot clearance bilat.  Pt needed assist to steer and sequence stepts and RW.  Pt with poorer posture as she fatigued.    Stairs            Wheelchair Mobility    Modified Rankin (Stroke Patients Only)       Balance Overall balance assessment: Needs assistance Sitting-balance support: No upper extremity supported;Feet supported Sitting balance-Leahy Scale: Fair     Standing balance support: Bilateral upper extremity supported;During functional activity Standing balance-Leahy Scale: Poor Standing balance comment: Relies on BUE support.                              Pertinent Vitals/Pain Pain Assessment: No/denies pain   VSS - pt on 1L with sats >90% Home Living Family/patient expects to be discharged to:: Private residence Living Arrangements: Alone Available Help at Discharge: Family;Available PRN/intermittently(near 24 hour assist) Type of Home: Apartment Home Access: Level entry     Home Layout: One level Home Equipment: Walker - 2 wheels;Shower seat;Bedside commode;Wheelchair - manual      Prior Function Level of Independence: Needs assistance   Gait / Transfers Assistance Needed: Ambulates with RW. Uses BSC during the night. Wheelchair for community mobility.   ADL's / Homemaking Assistance Needed: Daughter assists with tub transfers, does cooking for pt and assists with LB dressing tasks.   Comments: Was active with  [place] services (PT/OT/RN) on admission.     Hand Dominance        Extremity/Trunk Assessment   Upper Extremity Assessment Upper Extremity Assessment: Defer to OT evaluation    Lower Extremity Assessment Lower Extremity Assessment: Overall WFL for tasks assessed    Cervical / Trunk Assessment Cervical / Trunk Assessment: Kyphotic  Communication   Communication:  No difficulties  Cognition Arousal/Alertness: Awake/alert Behavior During Therapy: WFL for tasks assessed/performed Overall Cognitive Status: Impaired/Different from baseline Area of Impairment: Problem solving                             Problem Solving: Slow processing General Comments: Pt requiring increased time to process information      General Comments      Exercises General Exercises - Lower Extremity Ankle Circles/Pumps: AROM;Both;10 reps;Supine Heel Slides: AAROM;Both;10 reps;Supine   Assessment/Plan    PT Assessment Patient needs continued PT services  PT Problem List Decreased strength;Decreased mobility;Decreased activity tolerance;Decreased balance;Decreased knowledge of use of DME;Decreased safety awareness       PT Treatment Interventions DME instruction;Therapeutic activities;Gait training;Therapeutic exercise;Patient/family education;Balance training;Functional mobility training    PT Goals (Current goals can be found in the Care Plan section)  Acute Rehab PT Goals Patient Stated Goal: get rehab PT Goal Formulation: With patient Time For Goal Achievement: 07/08/17 Potential to Achieve Goals: Good    Frequency Min 2X/week   Barriers to discharge Decreased caregiver support      Co-evaluation               AM-PAC PT "6 Clicks" Daily Activity  Outcome Measure Difficulty turning over in bed (including adjusting bedclothes, sheets and blankets)?: A Lot Difficulty moving from lying on back to sitting on the side of the bed? : A Lot Difficulty sitting down on and standing up from a chair with arms (e.g., wheelchair, bedside commode, etc,.)?: A Lot Help needed moving to and from a bed to chair (including a wheelchair)?: A Lot Help needed walking in hospital room?: A Little Help needed climbing 3-5 steps with a railing? : A Lot 6 Click Score: 13    End of Session Equipment Utilized During Treatment: Gait belt;Oxygen Activity  Tolerance: Patient tolerated treatment well Patient left: with call bell/phone within reach;in chair;with chair alarm set Nurse Communication: Mobility status PT Visit Diagnosis: Other abnormalities of gait and mobility (R26.89);Muscle weakness (generalized) (M62.81);Unsteadiness on feet (R26.81);History of falling (Z91.81)    Time: 4128-7867 PT Time Calculation (min) (ACUTE ONLY): 24 min   Charges:     PT Treatments $Gait Training: 23-37 mins   PT G Codes:        Elvis Boot,PT Acute Rehabilitation [phone] (pager)   Denice Paradise 06/24/2017, 1:23 PM

## 2017-06-24 NOTE — Plan of Care (Signed)
  Problem: Activity: Goal: Risk for activity intolerance will decrease Outcome: Progressing   

## 2017-06-24 NOTE — Progress Notes (Addendum)
Progress Note  Patient Name: [name] Date of Encounter: 06/24/2017  Primary Cardiologist: [name], MD   Subjective   Denies any CP or SOB. Walked to the bathroom, but otherwise, has not ambulated  Inpatient Medications    Scheduled Meds: . amLODipine  5 mg Oral Daily  . enoxaparin (LOVENOX) injection  40 mg Subcutaneous Q24H  . furosemide  40 mg Intravenous BID  . insulin aspart  0-15 Units Subcutaneous TID WC  . insulin aspart  0-5 Units Subcutaneous QHS  . insulin aspart  4 Units Subcutaneous TID WC  . insulin glargine  10 Units Subcutaneous QHS  . nitroGLYCERIN  1 inch Topical Q6H  . polyethylene glycol  17 g Oral Daily  . senna-docusate  1 tablet Oral QHS  . senna-docusate  1 tablet Oral BID   Continuous Infusions:  PRN Meds: acetaminophen **OR** acetaminophen, hydrALAZINE   Vital Signs    Vitals:   06/23/17 0442 06/23/17 1511 06/23/17 2013 06/24/17 0624  BP: 102/67 138/81 (!) 145/84 (!) 181/86  Pulse: (!) 30 82 93 80  Resp:   20 16  Temp: 98.7 F (37.1 C) 98.7 F (37.1 C) 99.9 F (37.7 C) 98.5 F (36.9 C)  TempSrc: Oral Oral Oral Oral  SpO2: 99% 91% 92% 100%  Weight: 184 lb 1.4 oz (83.5 kg)   182 lb 5.1 oz (82.7 kg)  Height:        Intake/Output Summary (Last 24 hours) at 06/24/2017 0807 Last data filed at 06/23/2017 2013 Gross per 24 hour  Intake 1320 ml  Output 1600 ml  Net -280 ml   Filed Weights   06/23/17 0112 06/23/17 0442 06/24/17 0624  Weight: 184 lb 8.4 oz (83.7 kg) 184 lb 1.4 oz (83.5 kg) 182 lb 5.1 oz (82.7 kg)    Telemetry    2:1 AV block last night, now likely sinus with prolonged 1st degree AV block and underlying LBBB - Personally Reviewed  ECG    Sinus rhythm with 1st degree AVB 06/23/2017 - Personally Reviewed  Physical Exam   GEN: No acute distress.   Neck: No JVD Cardiac: RRR, no murmurs, rubs, or gallops.  Respiratory: Clear to auscultation bilaterally. GI: Soft, nontender, non-distended  MS: No edema;  No deformity. Neuro:  Nonfocal  Psych: Normal affect   Labs    Chemistry Recent Labs  Lab 06/22/17 1351 06/23/17 0519  NA 139 137  K 4.9 4.0  CL 101 101  CO2 27 28  GLUCOSE 131* 165*  BUN 23* 25*  CREATININE 1.37* 1.54*  CALCIUM 9.1 8.4*  GFRNONAA 37* 32*  GFRAA 43* 37*  ANIONGAP 11 8     Hematology Recent Labs  Lab 06/22/17 1351 06/23/17 0519  WBC 12.0* 10.3  RBC 3.80* 3.31*  HGB 12.0 10.1*  HCT 36.4 31.8*  MCV 95.8 96.1  MCH 31.6 30.5  MCHC 33.0 31.8  RDW 13.2 13.4  PLT 231 210    Cardiac Enzymes Recent Labs  Lab 06/22/17 1351 06/22/17 1647 06/22/17 2330 06/23/17 0519  TROPONINI 0.04* 0.03* 0.04* 0.06*   No results for input(s): TROPIPOC in the last 168 hours.   BNP Recent Labs  Lab 06/22/17 1415  BNP 1,160.1*     DDimer No results for input(s): DDIMER in the last 168 hours.   Radiology    Dg Cervical Spine 2-3 Views  Result Date: 06/22/2017 CLINICAL DATA:  Neck pain with history of recent fall EXAM: CERVICAL SPINE - 2-3 VIEW COMPARISON:  06/06/2005  FINDINGS: Partially visualized sternotomy. Bilateral carotid vascular calcification. Suboptimal visualization of C7 and cervicothoracic junction. Imaged vertebral bodies demonstrate normal stature. Mild degenerative changes at C5-C6. Normal prevertebral soft tissue thickness. IMPRESSION: 1. Suboptimal visualization of C7 and cervicothoracic junction. 2. Minimal degenerative change at C5-C6. No definite radiographic evidence for acute osseous abnormality involving imaged portions of the cervical spine. 3. Carotid vascular calcification Electronically Signed   By: Donavan Foil M.D.   On: 06/22/2017 18:27   Dg Thoracic Spine 2 View  Result Date: 06/22/2017 CLINICAL DATA:  Fall in the bathroom 2 days ago with midthoracic pain. Initial encounter. EXAM: THORACIC SPINE 2 VIEWS COMPARISON:  Two-view chest x-ray 06/06/2017 and 05/21/2011 FINDINGS: T11 compression fracture with moderate height loss, seen since  at least 2013. No visible acute fracture. No traumatic malalignment. Cardiomegaly with diffuse interstitial coarsening and trace effusions. IMPRESSION: 1. No acute osseous finding. 2. Remote T11 compression fracture. 3. CHF. Electronically Signed   By: Monte Fantasia M.D.   On: 06/22/2017 18:25   Dg Abd 1 View  Result Date: 06/22/2017 CLINICAL DATA:  History of recent fall with weakness EXAM: ABDOMEN - 1 VIEW COMPARISON:  None. FINDINGS: Surgical clips in the right upper quadrant. Visible lung bases are clear. Nonobstructed bowel-gas pattern. Phleboliths in the pelvis. IMPRESSION: Nonobstructed bowel-gas pattern. Electronically Signed   By: Donavan Foil M.D.   On: 06/22/2017 18:28   Dg Chest Portable 1 View  Result Date: 06/22/2017 CLINICAL DATA:  Shortness of breath. History of coronary artery disease, aortic valve replacement, breast malignancy. EXAM: PORTABLE CHEST 1 VIEW COMPARISON:  Chest x-ray of June 06, 2017 FINDINGS: The lungs are adequately inflated. The interstitial markings are increased bilaterally. The cardiac silhouette is mildly enlarged. The pulmonary vascularity is mildly prominent. There are post CABG changes. There is no pleural effusion. IMPRESSION: Marked change in the appearance of the chest since the previous study most compatible with pulmonary interstitial edema. One cannot exclude bilateral interstitial pneumonia in the appropriate clinical setting. Thoracic aortic atherosclerosis. Electronically Signed   By: David  Martinique M.D.   On: 06/22/2017 14:08    Cardiac Studies   Echo 06/07/2017 LV EF: 60% -   65%  Study Conclusions  - Left ventricle: The cavity size was normal. Wall thickness was   increased in a pattern of moderate LVH. Systolic function was   normal. The estimated ejection fraction was in the range of 60%   to 65%. Wall motion was normal; there were no regional wall   motion abnormalities. Features are consistent with a pseudonormal   left ventricular  filling pattern, with concomitant abnormal   relaxation and increased filling pressure (grade 2 diastolic   dysfunction). - Aortic valve: Probably trileaflet; severely calcified leaflets.   There was moderate stenosis. There was trivial regurgitation.   Mean gradient (S): 28 mm Hg. Peak gradient (S): 49 mm Hg. - Mitral valve: Severely calcified annulus. The findings are   consistent with mild stenosis. There was no significant   regurgitation. Mean gradient (D): 5 mm Hg. Valve area by pressure   half-time: 1.93 cm^2. - Left atrium: The atrium was mildly dilated. - Right ventricle: The cavity size was normal. Systolic function   was normal. - Pulmonary arteries: No complete TR doppler jet so unable to   estimate PA systolic pressure. - Inferior vena cava: The vessel was normal in size. The   respirophasic diameter changes were in the normal range (>= 50%),   consistent with normal central venous  pressure.  Impressions:  - Normal LV size with moderate LV hypertrophy. EF 60-65%. Moderate   diastolic dysfunction. Normal RV size and systolic function. The   aortic valve was not well-visualized but aortic stenosis was   probably moderate. Mild mitral stenosis. No significant valvular   abnormalities.   Patient Profile     72 y.o. female  with a hx of CAD, AS- with tissue AVR and single vessel CABG 2006 with VG to RCA, HLD, HTN, DM and breast cancer who is admitted for CHF  Assessment & Plan    1. Acute on chronic diastolic heart failure  - weight decreased from 188 to 182 lbs. Lasix changed from $RemoveBefo'60mg'QyfLxueEthE$  IV daily to $Remove'40mg'fBJNpCN$  BID.   - continue IV lasix $Remove'40mg'HxqqkcQ$  BID, pending BMET today. Does not appears patient received yesterday afternoon's dose  2. S/p pericardial tissue AVR 2006: moderate stenosis, but stable on recent echo 06/07/2017  3. Labile HTN: BP high, receiving first dose of amlodipine this morning  4. Mobitz II: cardizem switched to amlodipine, did have some 2:1 AV block last  night, but no more advanced AV block this morning, just prolonged 1st degree AVB  5. CAD s/p SVG to RCA 2006: denies any CP  6. HLD: on lipitor $RemoveBe'40mg'QzdCusiJf$  daily at home. Consider restart lipitor  For questions or updates, please contact [place] Please consult [email or web address] for contact info under Cardiology/STEMI.     SignedAlmyra Deforest, PA  06/24/2017, 8:07 AM    The patient was seen, examined and discussed with Almyra Deforest, PA-C and I agree with the above.   72 y.o. female with a hx of CAD, AS- with tissue AVR and single vessel CABG 2006 with VG to RCA, HLD, HTN, DM and breast cancer who is being seen today for the evaluation of heart failure, last seen by cardiology 2013.   She presented with worsening CHF. BNP 1160. Crakles at both lungs, B/L LE edema.  Echo 06/07/17 with EF 60-65%, moderate LVH, no RWMA, G2DD, moderate aortic stenosis with mean gradient 28 mm Hg.  She has diuresed well, Crea is uptrending, I would switch to PO lasix 40 mg BID today.  Telemetry shows intermittent 2:1 block at night and some blocked PACs, we switched cardizem to amlodipine. She is hypertensive, amlodipine will be increased to 10 mg po daily.  [name], MD 06/24/2017

## 2017-06-25 ENCOUNTER — Other Ambulatory Visit: Payer: Self-pay | Admitting: Physician Assistant

## 2017-06-25 ENCOUNTER — Other Ambulatory Visit: Payer: Self-pay | Admitting: Pharmacist

## 2017-06-25 ENCOUNTER — Inpatient Hospital Stay (HOSPITAL_COMMUNITY): Payer: Medicare Other

## 2017-06-25 DIAGNOSIS — I5032 Chronic diastolic (congestive) heart failure: Secondary | ICD-10-CM

## 2017-06-25 DIAGNOSIS — I739 Peripheral vascular disease, unspecified: Secondary | ICD-10-CM

## 2017-06-25 DIAGNOSIS — I2510 Atherosclerotic heart disease of native coronary artery without angina pectoris: Secondary | ICD-10-CM

## 2017-06-25 LAB — BASIC METABOLIC PANEL
Anion gap: 10 (ref 5–15)
BUN: 25 mg/dL — ABNORMAL HIGH (ref 6–20)
CO2: 30 mmol/L (ref 22–32)
Calcium: 8.6 mg/dL — ABNORMAL LOW (ref 8.9–10.3)
Chloride: 100 mmol/L — ABNORMAL LOW (ref 101–111)
Creatinine, Ser: 1.38 mg/dL — ABNORMAL HIGH (ref 0.44–1.00)
GFR calc Af Amer: 43 mL/min — ABNORMAL LOW (ref >60)
GFR calc non Af Amer: 37 mL/min — ABNORMAL LOW (ref >60)
Glucose, Bld: 118 mg/dL — ABNORMAL HIGH (ref 65–99)
Potassium: 3.9 mmol/L (ref 3.5–5.1)
Sodium: 140 mmol/L (ref 135–145)

## 2017-06-25 LAB — ECHOCARDIOGRAM COMPLETE
Height: 67 in
Weight: 2888.91 oz

## 2017-06-25 LAB — GLUCOSE, CAPILLARY
Glucose-Capillary: 114 mg/dL — ABNORMAL HIGH (ref 65–99)
Glucose-Capillary: 168 mg/dL — ABNORMAL HIGH (ref 65–99)
Glucose-Capillary: 215 mg/dL — ABNORMAL HIGH (ref 65–99)
Glucose-Capillary: 156 mg/dL — ABNORMAL HIGH (ref 65–99)

## 2017-06-25 MED ORDER — HYDRALAZINE HCL 50 MG PO TABS
50.0000 mg | ORAL_TABLET | Freq: Two times a day (BID) | ORAL | Status: DC
  Administered 2017-06-25 – 2017-06-26 (×3): 50 mg via ORAL
  Filled 2017-06-25 (×3): qty 1

## 2017-06-25 NOTE — Progress Notes (Signed)
Subjective:  Patient seen laying comfortably in bed this AM in no acute distress. States she had large bowel movement yesterday and previous abdominal discomfort resolved afterwards. Patient states she was able to sit in bedside chair and walk around room yesterday without dyspnea. Overall feels improved since admission.   Objective:  Vital signs in last 24 hours: Vitals:   06/24/17 1543 06/24/17 1641 06/24/17 2037 06/25/17 0410  BP: 136/80 (!) 146/77 128/70 (!) 155/85  Pulse:  88 99 84  Resp:   18 18  Temp:  98.4 F (36.9 C) 98.6 F (37 C) 98.4 F (36.9 C)  TempSrc:  Oral Oral Oral  SpO2:  96% 99% 99%  Weight:    179 lb 0.2 oz (81.2 kg)  Height:       Physical Exam  Constitutional: She appears well-developed and well-nourished. No distress.  HENT:  Mouth/Throat: Oropharynx is clear and moist.  Eyes: Conjunctivae and EOM are normal.  Cardiovascular: Normal rate, regular rhythm and intact distal pulses. Exam reveals no friction rub.  No murmur heard. Respiratory:  No accessory muscle use or nasal flaring. Interval improvement in basilar crackles from yesterday's exam. No wheezing.  GI: Soft. She exhibits no distension. There is no tenderness. There is no rebound.  Musculoskeletal: She exhibits edema (Trace pitting edema around ankles bilaterally, interval improvement from yesterday's exam.). She exhibits no tenderness.  Skin: Skin is warm and dry. No rash noted. She is not diaphoretic. No erythema.   Assessment/Plan:  Principal Problem:   Acute exacerbation of CHF (congestive heart failure) (HCC) Active Problems:   Diabetes mellitus with renal manifestations, controlled ([place])   Aortic stenosis s/p Tissure AVR 2006   Hypertension   CKD (chronic kidney disease) stage 2, GFR 60-89 ml/min   HOCM (hypertrophic obstructive cardiomyopathy) (HCC)   CHF exacerbation ([place])  72 yo F with a past medical history of HTN, DM, PAD, Aortic Stenosis s/p repair in 2006 who presented to  the ED with complaints of  shortness of breath and was found to have acute CHF exacerbation. She was admitted to the internal medicine teaching service with cardiology consulting. The specific problems addressed during admission are as follows.  Diastolic HF Exacerbation (LVEF 20-65%, G2DD, Hx of HOCM): Presented with progressive dyspnea, worsening of chronic LE edema, elevated BNP, and interstitial edema on CXR in the setting of missing several days of medication. Patient has clinically improved with diuresis and now appears euvolemic at a dry weight of 179 lbs.  -Cardiology consult, appreciate their recommnendations -Strict I/Os, daily weight -Continue oral lasix 40 mg BID  -Stable for D/C pending SNF placement  Acute on Chronic Kidney Disease, stage III: Baseline Cr 1.1-1.2 per chart review. Patient's Cr elevated to 1.54 on admission and currently down trending with diuresis, suggesting relative hypotension contributing to patient's AKI. Cr stable at 1.38 today -Continue to hold nephrotoxic medications -Will need BMP as outpatient after discharge  HTN: Patient normotensive overnight, suggesting BP responding to BID hydralazine started yesterday. BP elevated to 175/98 this AM prior to morning dose of hydralazine. -Increase amlodipine to 10 mg today -Continue Hydralazine 25 mg BID while holding home lisinopril  First degree AV Block: Chronic. No acute events overnight. During hospitalization patient's metoprolol held given HF exacerbation and diltiazem switched to amlodipine.  -Cardiology consulted recommendations appreciated  Hx of CVA and falls: Patient's trauma workup negative for acute fractures on admission. PT/OT recommending SNF, SW consulted for placement. Will touch base with them today regarding possible discharge  now that patient is stably euvolemic.  -SW consulted and appreciated  Diabetes: On metformin $RemoveBefo'1000mg'AuYVGsoFWsi$  BID, Levemir 24units qhs, Novolog 10units TID as outpatient.  Overnight CBG elevated above 200, others appropriate.  -CBG monitoring TID and qhs -Lantus 10U qhs -Novolog 4U TID and SSI-M  FEN/GI: -Heart Healthy/Carb Modified diet with 1.2L volume restriction -No IVF, replace electrolytes as needed -Senna 1-2 tablets BID PRN and miralax PRN for constipation  VTE Prophylaxis: Lovenox daily Code Status: Full  Dispo: Anticipated discharge in approximately 1-2 day(s).   Thomasene Ripple, MD 06/25/2017, 7:02 AM Pager: [phone]

## 2017-06-25 NOTE — Progress Notes (Signed)
CSW confirmed patient's bed with [name] is ready today. CSW has spoken with patient and two of her daughter, [name] and Bonnita Nasuti, at bedside this morning. Patient and family agreeable for patient to go to [facility]. Family's questions asked and answered. CSW to support with discharge.  Estanislado Emms, [place]

## 2017-06-25 NOTE — Progress Notes (Signed)
ABI's have been completed. Right 1.23 Left 1.38  06/25/17 4:44 PM [name] RVT

## 2017-06-25 NOTE — Progress Notes (Addendum)
Progress Note  Patient Name: [name] Date of Encounter: 06/25/2017  Primary Cardiologist: [name], MD   Subjective   Denies any CP, breathing better  Inpatient Medications    Scheduled Meds: . amLODipine  10 mg Oral Daily  . aspirin EC  81 mg Oral Daily  . atorvastatin  80 mg Oral q1800  . enoxaparin (LOVENOX) injection  40 mg Subcutaneous Q24H  . furosemide  40 mg Oral BID  . hydrALAZINE  25 mg Oral BID  . insulin aspart  0-15 Units Subcutaneous TID WC  . insulin aspart  0-5 Units Subcutaneous QHS  . insulin aspart  4 Units Subcutaneous TID WC  . insulin glargine  10 Units Subcutaneous QHS  . PARoxetine  20 mg Oral Daily  . polyethylene glycol  17 g Oral Daily   Continuous Infusions:  PRN Meds: acetaminophen **OR** acetaminophen, bisacodyl, hydrALAZINE, senna-docusate   Vital Signs    Vitals:   06/24/17 1543 06/24/17 1641 06/24/17 2037 06/25/17 0410  BP: 136/80 (!) 146/77 128/70 (!) 155/85  Pulse:  88 99 84  Resp:   18 18  Temp:  98.4 F (36.9 C) 98.6 F (37 C) 98.4 F (36.9 C)  TempSrc:  Oral Oral Oral  SpO2:  96% 99% 99%  Weight:    179 lb 0.2 oz (81.2 kg)  Height:        Intake/Output Summary (Last 24 hours) at 06/25/2017 0937 Last data filed at 06/24/2017 2010 Gross per 24 hour  Intake 1080 ml  Output 800 ml  Net 280 ml   Filed Weights   06/23/17 0442 06/24/17 0624 06/25/17 0410  Weight: 184 lb 1.4 oz (83.5 kg) 182 lb 5.1 oz (82.7 kg) 179 lb 0.2 oz (81.2 kg)    Telemetry    1st degree AV block with LBBB - Personally Reviewed  ECG    No new EKG  Physical Exam   GEN: No acute distress.   Neck: No JVD Cardiac: RRR, no murmurs, rubs, or gallops.  Respiratory: Clear to auscultation bilaterally. GI: Soft, nontender, non-distended  MS: No edema; No deformity. Neuro:  Nonfocal  Psych: Normal affect   Labs    Chemistry Recent Labs  Lab 06/23/17 0519 06/24/17 0718 06/25/17 0407  NA 137 140 140  K 4.0 4.2 3.9  CL 101  103 100*  CO2 _0 GLUCOSE 165* 127* 118*  BUN 25* 24* 25*  CREATININE 1.54* 1.40* 1.38*  CALCIUM 8.4* 8.6* 8.6*  GFRNONAA 32* 36* 37*  GFRAA 37* 42* 43*  ANIONGAP _1 Hematology Recent Labs  Lab 06/22/17 1351 06/23/17 0519  WBC 12.0* 10.3  RBC 3.80* 3.31*  HGB 12.0 10.1*  HCT 36.4 31.8*  MCV 95.8 96.1  MCH 31.6 30.5  MCHC 33.0 31.8  RDW 13.2 13.4  PLT 231 210    Cardiac Enzymes Recent Labs  Lab 06/22/17 1351 06/22/17 1647 06/22/17 2330 06/23/17 0519  TROPONINI 0.04* 0.03* 0.04* 0.06*   No results for input(s): TROPIPOC in the last 168 hours.   BNP Recent Labs  Lab 06/22/17 1415  BNP 1,160.1*     DDimer No results for input(s): DDIMER in the last 168 hours.   Radiology    No results found.  Cardiac Studies   Echo 06/07/2017 LV EF: 60% - 65%  Study Conclusions  - Left ventricle: The cavity size was normal. Wall thickness was increased in a pattern of moderate LVH. Systolic function was  normal. The estimated ejection fraction was in the range of 60% to 65%. Wall motion was normal; there were no regional wall motion abnormalities. Features are consistent with a pseudonormal left ventricular filling pattern, with concomitant abnormal relaxation and increased filling pressure (grade 2 diastolic dysfunction). - Aortic valve: Probably trileaflet; severely calcified leaflets. There was moderate stenosis. There was trivial regurgitation. Mean gradient (S): 28 mm Hg. Peak gradient (S): 49 mm Hg. - Mitral valve: Severely calcified annulus. The findings are consistent with mild stenosis. There was no significant regurgitation. Mean gradient (D): 5 mm Hg. Valve area by pressure half-time: 1.93 cm^2. - Left atrium: The atrium was mildly dilated. - Right ventricle: The cavity size was normal. Systolic function was normal. - Pulmonary arteries: No complete TR doppler jet so unable to estimate PA systolic  pressure. - Inferior vena cava: The vessel was normal in size. The respirophasic diameter changes were in the normal range (>= 50%), consistent with normal central venous pressure.  Impressions:  - Normal LV size with moderate LV hypertrophy. EF 60-65%. Moderate diastolic dysfunction. Normal RV size and systolic function. The aortic valve was not well-visualized but aortic stenosis was probably moderate. Mild mitral stenosis. No significant valvular abnormalities.  Patient Profile     72 y.o. female with a hx of CAD, AS- with tissue AVR and single vessel CABG 2006 with VG to RCA, HLD, HTN, DM and breast cancerwho is admitted for CHF  Assessment & Plan    1. Acute on chronic diastolic heart failure             - weight decreased from 188 to 179 lbs.   - appears to be euvolemic by this point, transitioned to PO lasix 69m BID. She is euvolemic by this point, continue on current diuretic, 1 week BMET after discharge. Likely can be discharged today or tomorrow. OT recommended SNF  2. S/p pericardial tissue AVR 2006: moderate stenosis, but stable on recent echo 06/07/2017  3. Labile HTN: BP high, amlodipine 10 mg added yesterday, BP remain high, increase hydralazine to 517mBID  4. Mobitz II: cardizem switched to amlodipine, 2:1 AV block resolved, now just prolonged 1st degree AVB  5. CAD s/p SVG to RCA 2006: denies any CP  6. HLD: on lipitor 4054maily at home. Consider restart home lipitor  7. Deconditioning: unclear baseline functional ability, she says she sat in the chair yesterday, however every time I met her, she has always been laying in the bed  For questions or updates, please contact [facility] consult [email or web address] for contact info under Cardiology/STEMI.   Signed, HAlmyra [name]  06/25/2017, 9:37 AM    The patient was seen, examined and discussed with [name]-C and I agree with the above.   [age]o.femalewith a hx of CAD, AS- with  tissue AVR and single vessel CABG 2006 with VG to RCA, HLD, HTN, DM and breast cancerwho is being seen today for the evaluation of heart failure,last seen by cardiology 2013. She presented with worsening CHF.BNP 1160. Crakles at both lungs, B/L LE edema.  Echo 4/14/19with EF 60-65%, moderate LVH, no RWMA, G2DD, moderate aortic stenosis with mean gradient 28 mm Hg.  She has diuresed well, down 10 lbs, now euvolemic, transitioned to po Lasix 40 mg BID, Crea has improved, she can be discharged today, telemetry shows intermittent 2:1 block at night and some blocked PACs, cardizem was switched to amlodipine with improvement. BP improved, we will arrange for an outpatient follow  up.  [name], MD 06/25/2017

## 2017-06-25 NOTE — Discharge Summary (Signed)
Name: [name] MRN: [id number] DOB: 14-Dec-1945 72 y.o. PCP: [name], MD  Date of Admission: 06/22/2017  1:31 PM Date of Discharge: 06/26/2017 Attending Physician: [name], MD  Discharge Diagnosis: Principal Problem:   Acute exacerbation of CHF (congestive heart failure) ([place]) Active Problems:   Diabetes mellitus with renal manifestations, controlled ([place])   Aortic stenosis s/p Tissure AVR 2006   Hypertension   CKD (chronic kidney disease) stage 2, GFR 60-89 ml/min   HOCM (hypertrophic obstructive cardiomyopathy) (HCC)   CHF exacerbation ([place])  Discharge Medications: Allergies as of 06/26/2017   No Known Allergies     Medication List    STOP taking these medications   diltiazem 120 MG 24 hr capsule Commonly known as:  CARDIZEM CD   diltiazem 240 MG 24 hr capsule Commonly known as:  DILACOR XR   lisinopril 40 MG tablet Commonly known as:  PRINIVIL,ZESTRIL     TAKE these medications   ACCU-CHEK FASTCLIX LANCETS Misc Use to check blood sugar up to 3 times a day   acetaminophen 325 MG tablet Commonly known as:  TYLENOL Take 650 mg by mouth every 6 (six) hours as needed for moderate pain.   amLODipine 10 MG tablet Commonly known as:  NORVASC Take 1 tablet (10 mg total) by mouth daily.   aspirin 81 MG EC tablet TAKE 1 TABLET (81 MG TOTAL) BY MOUTH DAILY. SWALLOW WHOLE.   atorvastatin 80 MG tablet Commonly known as:  LIPITOR Take 1 tablet (80 mg total) by mouth daily at 6 PM. What changed:    medication strength  how much to take  when to take this   docusate sodium 100 MG capsule Commonly known as:  COLACE Take 200 mg by mouth as needed for mild constipation.   furosemide 40 MG tablet Commonly known as:  LASIX Take 1 tablet (40 mg total) by mouth 2 (two) times daily.   glucose blood test strip Commonly known as:  ACCU-CHEK GUIDE Check blood sugar 3 times a day as instructed   hydrALAZINE 50 MG tablet Commonly known  as:  APRESOLINE Take 1 tablet (50 mg total) by mouth 2 (two) times daily.   insulin aspart 100 UNIT/ML FlexPen Commonly known as:  NOVOLOG FLEXPEN Inject 10 Units into the skin 3 (three) times daily with meals.   Insulin Detemir 100 UNIT/ML Pen Commonly known as:  LEVEMIR FLEXTOUCH Inject 24 Units into the skin at bedtime.   Loratadine 10 MG Caps Take 1 capsule (10 mg total) by mouth daily.   metFORMIN 1000 MG tablet Commonly known as:  GLUCOPHAGE Take 1 tablet (1,000 mg total) by mouth 2 (two) times daily with a meal.   metoprolol succinate 25 MG 24 hr tablet Commonly known as:  TOPROL-XL Take 1 tablet (25 mg total) by mouth daily.   PARoxetine 20 MG tablet Commonly known as:  PAXIL Take 1 tablet (20 mg total) by mouth daily.   Pen Needles 3/16" 31G X 5 MM Misc 1 each by Does not apply route 4 (four) times daily. Use to check blood sugars 4 times a day. Dx code E11.22   polyethylene glycol packet Commonly known as:  MIRALAX / GLYCOLAX Take 17 g by mouth daily.   tetrahydrozoline 0.05 % ophthalmic solution Place 1 drop into both eyes as needed (dry eyes).       Disposition and follow-up:   Ms.September C Runion was discharged from [facility] in Stable condition.  At the  hospital follow up visit please address:  1.  Patient presented after a fall with SOB& LE edema and was found to have an acute CHF exacerbation as a result of missed medication doses leading up to admission. Patient responded to IV diuresis and discharged on oral regimen at a dry weight of 179 lbs. Please assess for signs and symptoms volume overload. Please also assess for medication compliance.   She also had an acute on chronic kidney disease during hospitalization (Cr =1.54, baseline 1.1-1.2). Patient's Cr trended toward baseline with diuresis and holding of lisinopril. Please assess Cr function. Please restart lisinopril for HTN and discontinue use of hydralazine that was started during  hospitalization if appropriate.   Patient endorsed claudication leading up to hospitalization. Outpatient ABI suggested PAD. Increased to high intensity statin during hospitalization. Patient received Arterial Doppler Study during hospitalization, results pending on discharge. Please follow up this study appropriately.  2.  Labs / imaging needed at time of follow-up: BMP 1 week after discharge to assess electrolytes and kidney function  3.  Pending labs/ test needing follow-up: Arterial ultrasound lower extremities  Follow-up Appointments:  Contact information for follow-up providers    Dunn, Kriste Basque N, PA-C Follow up on 07/01/2017.   Specialties:  Cardiology, Radiology Why:  11:30AM. Cardiology office visit. Obtain BMET labwork during the same visit Contact information: [address] [place] [address] [phone]        Burns Spain, MD. Go on 07/03/2017.   Specialty:  Internal Medicine Why:  May 10 @ 10:45 AM in the Internal Medicine Clinic. At this hospital follow up visit they will reveiw blood work, get additional labs if needed, and go over the changes made to your medications during this hospitalization.  Contact information: [address] [place] [address] [phone]        Lars Masson, MD .   Specialty:  Cardiology Contact information: [address] [place] [address] [phone]            Contact information for after-discharge care    Destination    [facility] .   Service:  Skilled Nursing Contact information: [address] [place] [address] [phone]                 Hospital Course by problem list: Principal Problem:   Acute exacerbation of CHF (congestive heart failure) (HCC) Active Problems:   Diabetes mellitus with renal manifestations, controlled (HCC)   Aortic stenosis s/p Tissure AVR 2006   Hypertension   CKD (chronic kidney  disease) stage 2, GFR 60-89 ml/min   HOCM (hypertrophic obstructive cardiomyopathy) (HCC)   CHF exacerbation (HCC)   72 yo F with a past medical history of HFpEF, HOCM, HTN, DM, PAD, Aortic Stenosis s/p repair in 2006 who presented to the ED with complaints of shortness of breath on 06/22/17 and was found to have acute CHF exacerbation. She was admitted to the internal medicine teaching service with cardiology consulting. The specific problems addressed during admission are as follows.  Diastolic HF Exacerbation (LVEF 60-65%, G2DD in 06/07/17, Hx of HOCM): Patient presented withprogressive dyspnea,worsening of chronic LEedema,elevated BNP, andinterstitial edemaon CXRin the setting of missingseveral daysofmedication. Patient has clinically improved with IV diuresis (40 mg BID) and was successfully transitioned to oral lasix 40 mg BID on discharge. At the time of discharge the patient appeared euvolemic at a dry weight of 179 lbs. Her home metoprolol xL 25 mg  was held during hospitalization for acute CHF exacerbation and restarted on discharge.  Acute on Chronic Kidney Disease, stage III: Baseline Cr 1.1-1.2 per chart review. Patient's Cr elevated to 1.54 on admission and downtrending to 1.38 with holding of nephrotoxic medications (lisinopril) and diuresis. Patient was discharged with instructions to hold lisinopril until follow up with PCP to assess renal function. Please restart lisinopril at hospital follow up appointment for long term renal protection.   First degree AV Block: Patient noted to have chronic first degree AV block on admission. During hospitalization possible 2nd degree Type II heart block was observed on telemetry. Cardiology was consulted and recommended switching diltiazem to amlodipine during hospitalization. After this medication change no additional events of 2nd degree heart block were noted on telemetry.   HTN: Patient hypertensive during hospitalization as a result of  needing to hold multiple home medications, including metoprolol, diltiazem, and lisinopril, as a result of acute CHF, AV block, and AKI respectively. As described above patient transitioned from diltiazem to amlodipine 10 mg during hospitalization 2/2 bradycardia. She was started on hydralazine 50 mg BID for management of HTN while inpatient given that all home medications were held during hospitalization. On discharge patient was continued on amlodipine and hydralazine. Please restart lisinopril 40 mg at follow up if appropriate and discontinue use of hydralazine 25 mg BID when restarting this medication, as patient was previously well controlled on lisinopril, metoprolol, and diltiazem alone. Would recommend close follow up after reinitiating lisinopril to ensure no additional medications needed for HTN management.   Hx of CVA and falls: Patient reported a mechanical fall in the few days prior to admission. Her trauma workup was negative for acute fractures on admission and back pain resolved during hospitalization. PT/OT evaluation obtained early in hospitalization recommended SNF for acute rehabilitation.   PAD: Patient previously endorsed symptoms of claudication as outpatient and had abnormal ABI's at last outpatient visit. Patient's statin increased to high intensity as a result of this abnormal test. Vascular arterial ultrasound ordered prior to discharge. ABI's obtained during this study 1.2 and 1.3 on R&L, respectively. Results of the ultrasound pending at discharge. Please follow up and arrange vascular surgery consult if appropriate.  Discharge Vitals:   BP 129/73 (BP Location: Right Arm)   Pulse 94   Temp 98.7 F (37.1 C) (Oral)   Resp 18   Ht $R'5\' 8"'CQ$  (1.727 m)   Wt 179 lb 3.7 oz (81.3 kg)   SpO2 94%   BMI 27.25 kg/m   Pertinent Labs, Studies, and Procedures:  BMP Latest Ref Rng & Units 06/26/2017 06/25/2017 06/24/2017  Glucose 65 - 99 mg/dL 137(H) 118(H) 127(H)  BUN 6 - 20 mg/dL 23(H)  25(H) 24(H)  Creatinine 0.44 - 1.00 mg/dL 1.39(H) 1.38(H) 1.40(H)  BUN/Creat Ratio 12 - 28 - - -  Sodium 135 - 145 mmol/L 143 140 140  Potassium 3.5 - 5.1 mmol/L 3.9 3.9 4.2  Chloride 101 - 111 mmol/L 102 100(L) 103  CO2 22 - 32 mmol/L $RemoveB'30 30 30  'NTzKkaKK$ Calcium 8.9 - 10.3 mg/dL 8.6(L) 8.6(L) 8.6(L)   CBC Latest Ref Rng & Units 06/23/2017 06/22/2017 06/06/2017  WBC 4.0 - 10.5 K/uL 10.3 12.0(H) 7.5  Hemoglobin 12.0 - 15.0 g/dL 10.1(L) 12.0 12.7  Hematocrit 36.0 - 46.0 % 31.8(L) 36.4 38.9  Platelets 150 - 400 K/uL 210 231 255   Troponin, serum q6 hours x3 = 0.03, 0.04, 0.06 BNP = 1,160  Chest Xray, 06/22/2017 IMPRESSION: Marked change in the appearance of  the chest since the previous study most compatible with pulmonary interstitial edema. One cannot exclude bilateral interstitial pneumonia in the appropriate clinical setting.  Thoracic aortic atherosclerosis.  Cervical & Thoracic spine Xray, 06/22/2017 Cervical Spine IMPRESSION: 1. Suboptimal visualization of C7 and cervicothoracic junction. 2. Minimal degenerative change at C5-C6. No definite radiographic evidence for acute osseous abnormality involving imaged portions of the cervical spine. 3. Carotid vascular calcification  Thoracic Spine IMPRESSION: 1. No acute osseous finding. 2. Remote T11 compression fracture. 3. CHF.  Abdominal Xray, 06/22/2017 FINDINGS: Surgical clips in the right upper quadrant. Visible lung bases are clear. Nonobstructed bowel-gas pattern. Phleboliths in the pelvis.  IMPRESSION: Nonobstructed bowel-gas pattern.  Arterial Ultrasound with ABI, Bilateral lower extremity, 06/25/2017 - PENDING Right 1.23 Left 1.38 *Please look in chart for final report*  Discharge Instructions: Discharge Instructions    ([place]) Call MD:  Anytime you have any of the following symptoms: 1) 3 pound weight gain in 24 hours or 5 pounds in 1 week 2) shortness of breath, with or without a dry hacking cough 3)  swelling in the hands, feet or stomach 4) if you have to sleep on extra pillows at night in order to breathe.   Complete by:  As directed    Call MD for:  temperature >100.4   Complete by:  As directed    Diet - low sodium heart healthy   Complete by:  As directed    Discharge instructions   Complete by:  As directed    You were evaluated for falls, shortness of breath, and worsening lower extremity edema. You were found to have too much fluid on your body. Your symptoms improved with removal of this fluid using diuretics. After leaving the hospital please continue to take your diuretics - lasix - twice a day, every day. You were prescribed new medications as a result of your condition while you were in the hospital. You will need to have close follow up with your primary care physician to go over these changes to your medications. Please look closely at this medication list and bring all of your pills to your doctor in the internal medicine clinic at follow up on Friday Jul 03, 2017 @ 10:45AM. They will help sort through all of your medications and make sure that your prescriptions and medication lists are current.  You will also need to follow up with your cardiologist on Wednesday Jul 01, 2016 at 11:30AM. Please use the information provided on this paperwork to follow up appropriately.   Increase activity slowly   Complete by:  As directed       Signed: Thomasene Ripple, MD 06/26/2017, 11:43 AM   Pager: [phone]

## 2017-06-25 NOTE — Progress Notes (Signed)
  Date: 06/25/2017  Patient name: BRYNDLE CORREDOR  Medical record number: [id number]  Date of birth: 05-26-1945   I have seen and evaluated this patient and I have discussed the plan of care with the house staff. Please see their note for complete details. I concur with their findings with the following additions/corrections: Ms Rosinski cont to feel better. She walked 50 ft with PT yesterday. Still with crackles L base. Not requiring O2. Weight down maybe 5 lbs (weights are not terribly accurate). Cr down this admission but still higher than baseline 6 months ago. Agree with Dr Gevena Barre D/C med regimen. As outpt, I will work to transition her from hydral to ACE. Amlodipine will worsen her diabetic nephropathy. However could not tolerate BB (needed for CAD) and dilt. I doubt her life expectancy is long enough to develop complications from diabetic nephropathy worsened by her amlodipine. D/C SNF today.  [name], MD 06/25/2017, 2:16 PM

## 2017-06-25 NOTE — Progress Notes (Signed)
[facility] has closed. Patient will need prescriptions sent to a different pharmacy (possibly [facility] on [address]).

## 2017-06-25 NOTE — Progress Notes (Signed)
Patient down for test this afternoon. SNF would need discharge summary before 5 pm to fill patient medications. CSW consulted MD, plan for discharge tomorrow morning. CSW confirmed patient bed at [facility] will be available tomorrow. CSW called patient's daughter, and updated her on plan to discharge tomorrow. CSW to follow and support.  Estanislado Emms, [place]

## 2017-06-26 DIAGNOSIS — I739 Peripheral vascular disease, unspecified: Secondary | ICD-10-CM | POA: Diagnosis not present

## 2017-06-26 DIAGNOSIS — Z8673 Personal history of transient ischemic attack (TIA), and cerebral infarction without residual deficits: Secondary | ICD-10-CM | POA: Diagnosis not present

## 2017-06-26 DIAGNOSIS — E1122 Type 2 diabetes mellitus with diabetic chronic kidney disease: Secondary | ICD-10-CM | POA: Diagnosis not present

## 2017-06-26 DIAGNOSIS — E1151 Type 2 diabetes mellitus with diabetic peripheral angiopathy without gangrene: Secondary | ICD-10-CM | POA: Diagnosis not present

## 2017-06-26 DIAGNOSIS — N182 Chronic kidney disease, stage 2 (mild): Secondary | ICD-10-CM | POA: Diagnosis not present

## 2017-06-26 DIAGNOSIS — M79604 Pain in right leg: Secondary | ICD-10-CM | POA: Diagnosis not present

## 2017-06-26 DIAGNOSIS — Z8679 Personal history of other diseases of the circulatory system: Secondary | ICD-10-CM | POA: Diagnosis not present

## 2017-06-26 DIAGNOSIS — Z9181 History of falling: Secondary | ICD-10-CM | POA: Diagnosis not present

## 2017-06-26 DIAGNOSIS — M79605 Pain in left leg: Secondary | ICD-10-CM | POA: Diagnosis not present

## 2017-06-26 DIAGNOSIS — I5030 Unspecified diastolic (congestive) heart failure: Secondary | ICD-10-CM | POA: Diagnosis not present

## 2017-06-26 DIAGNOSIS — R011 Cardiac murmur, unspecified: Secondary | ICD-10-CM

## 2017-06-26 DIAGNOSIS — I130 Hypertensive heart and chronic kidney disease with heart failure and stage 1 through stage 4 chronic kidney disease, or unspecified chronic kidney disease: Secondary | ICD-10-CM | POA: Diagnosis not present

## 2017-06-26 DIAGNOSIS — E7849 Other hyperlipidemia: Secondary | ICD-10-CM | POA: Diagnosis not present

## 2017-06-26 DIAGNOSIS — B027 Disseminated zoster: Secondary | ICD-10-CM | POA: Diagnosis not present

## 2017-06-26 DIAGNOSIS — I440 Atrioventricular block, first degree: Secondary | ICD-10-CM | POA: Diagnosis not present

## 2017-06-26 DIAGNOSIS — N179 Acute kidney failure, unspecified: Secondary | ICD-10-CM | POA: Diagnosis not present

## 2017-06-26 DIAGNOSIS — M6281 Muscle weakness (generalized): Secondary | ICD-10-CM | POA: Diagnosis not present

## 2017-06-26 DIAGNOSIS — E119 Type 2 diabetes mellitus without complications: Secondary | ICD-10-CM | POA: Diagnosis not present

## 2017-06-26 DIAGNOSIS — I509 Heart failure, unspecified: Secondary | ICD-10-CM | POA: Diagnosis not present

## 2017-06-26 DIAGNOSIS — Z794 Long term (current) use of insulin: Secondary | ICD-10-CM | POA: Diagnosis not present

## 2017-06-26 DIAGNOSIS — Z743 Need for continuous supervision: Secondary | ICD-10-CM | POA: Diagnosis not present

## 2017-06-26 DIAGNOSIS — I10 Essential (primary) hypertension: Secondary | ICD-10-CM | POA: Diagnosis not present

## 2017-06-26 DIAGNOSIS — I5031 Acute diastolic (congestive) heart failure: Secondary | ICD-10-CM | POA: Diagnosis not present

## 2017-06-26 DIAGNOSIS — R278 Other lack of coordination: Secondary | ICD-10-CM | POA: Diagnosis not present

## 2017-06-26 DIAGNOSIS — F339 Major depressive disorder, recurrent, unspecified: Secondary | ICD-10-CM | POA: Diagnosis not present

## 2017-06-26 DIAGNOSIS — R279 Unspecified lack of coordination: Secondary | ICD-10-CM | POA: Diagnosis not present

## 2017-06-26 DIAGNOSIS — B029 Zoster without complications: Secondary | ICD-10-CM | POA: Diagnosis not present

## 2017-06-26 LAB — BASIC METABOLIC PANEL
Anion gap: 11 (ref 5–15)
BUN: 23 mg/dL — ABNORMAL HIGH (ref 6–20)
CO2: 30 mmol/L (ref 22–32)
Calcium: 8.6 mg/dL — ABNORMAL LOW (ref 8.9–10.3)
Chloride: 102 mmol/L (ref 101–111)
Creatinine, Ser: 1.39 mg/dL — ABNORMAL HIGH (ref 0.44–1.00)
GFR calc Af Amer: 42 mL/min — ABNORMAL LOW (ref >60)
GFR calc non Af Amer: 37 mL/min — ABNORMAL LOW (ref >60)
Glucose, Bld: 137 mg/dL — ABNORMAL HIGH (ref 65–99)
Potassium: 3.9 mmol/L (ref 3.5–5.1)
Sodium: 143 mmol/L (ref 135–145)

## 2017-06-26 LAB — GLUCOSE, CAPILLARY
Glucose-Capillary: 142 mg/dL — ABNORMAL HIGH (ref 65–99)
Glucose-Capillary: 148 mg/dL — ABNORMAL HIGH (ref 65–99)

## 2017-06-26 MED ORDER — FUROSEMIDE 40 MG PO TABS: 40.0000 mg | ORAL_TABLET | Freq: Two times a day (BID) | ORAL | 0 refills | Status: DC

## 2017-06-26 MED ORDER — AMLODIPINE BESYLATE 10 MG PO TABS: 10.0000 mg | ORAL_TABLET | Freq: Every day | ORAL | 0 refills | Status: DC

## 2017-06-26 MED ORDER — ATORVASTATIN CALCIUM 80 MG PO TABS: 80.0000 mg | ORAL_TABLET | Freq: Every day | ORAL | 0 refills | Status: DC

## 2017-06-26 MED ORDER — HYDRALAZINE HCL 50 MG PO TABS: 50.0000 mg | ORAL_TABLET | Freq: Two times a day (BID) | ORAL | 0 refills | Status: DC

## 2017-06-26 NOTE — Progress Notes (Signed)
  Date: 06/26/2017  Patient name: [name]  Medical record number: [id number]  Date of birth: 18-May-1945   I have seen and evaluated this patient and I have discussed the plan of care with the house staff. Please see their note for complete details. I concur with their findings with the following additions/corrections: [name] was seen on AM rounds. She is objectively and subjectively better. No LE edema. Appears euvolemic. Cr not yet at baseline of 1.1 (at 1.4 today). D/C meds per Dr Berneice Gandy and R/U in [facility] next week to resume ACE. D/C SNF today.  [name], MD 06/26/2017, 12:34 PM

## 2017-06-26 NOTE — Progress Notes (Signed)
Subjective:  Patient seen laying comfortably in bed this AM. Patient states she feels the best she has felt in over a week today. Glad her leg swelling has improved and that she no longer has any trouble breathing. Ready to go to SNF today and excited about the transition.   Objective:  Vital signs in last 24 hours: Vitals:   06/25/17 0948 06/25/17 1340 06/25/17 2046 06/26/17 0437  BP: (!) 175/98 136/86 122/81 129/73  Pulse:  (!) 110 96 94  Resp:   18   Temp:  99.1 F (37.3 C) 98.8 F (37.1 C) 98.7 F (37.1 C)  TempSrc:  Oral Oral Oral  SpO2:  93% 94% 94%  Weight:    179 lb 3.7 oz (81.3 kg)  Height:       Physical Exam  Constitutional: She appears well-developed and well-nourished. No distress.  HENT:  Mouth/Throat: Oropharynx is clear and moist.  Eyes: Conjunctivae and EOM are normal.  Cardiovascular: Normal rate, regular rhythm and intact distal pulses. Exam reveals no friction rub.  Murmur (holosystolic mumur at RUSB) heard. Respiratory:  No accessory muscle use or nasal flaring. Interval improvement in basilar crackles from yesterday's exam. No wheezing.  GI: Soft. She exhibits no distension. There is no tenderness. There is no rebound.  Musculoskeletal: She exhibits edema (Trace pitting edema around ankles bilaterally, interval improvement from yesterday's exam.). She exhibits no tenderness.  Skin: Skin is warm and dry. No rash noted. She is not diaphoretic. No erythema.   Assessment/Plan:  Principal Problem:   Acute exacerbation of CHF (congestive heart failure) (HCC) Active Problems:   Diabetes mellitus with renal manifestations, controlled ([place])   Aortic stenosis s/p Tissure AVR 2006   Hypertension   CKD (chronic kidney disease) stage 2, GFR 60-89 ml/min   HOCM (hypertrophic obstructive cardiomyopathy) (HCC)   CHF exacerbation ([place])  72 yo F with a past medical history of HTN, DM, PAD, Aortic Stenosis s/p repair in 2006 who presented to the ED with complaints  of  shortness of breath and was found to have acute CHF exacerbation. She was admitted to the internal medicine teaching service with cardiology consulting. The specific problems addressed during admission are as follows.  Diastolic HF Exacerbation (LVEF 20-65%, G2DD, Hx of HOCM): Patient transitioned to oral lasix 40 mg BID for 2 days. Stable at dry weight of 179 lbs since yesterday.  -Cardiology consult, appreciate their recommnendations -Strict I/Os, daily weight -Continue oral lasix 40 mg BID  -Stable for D/C to SNF today  Acute on Chronic Kidney Disease, stage III: Baseline Cr 1.1-1.2 per chart review. Patient's Cr elevated to 1.54 on admission and currently down trending with diuresis, suggesting relative hypotension contributing to patient's AKI. Cr stable at 1.39 today -Continue to hold nephrotoxic medications -Will need BMP as outpatient after discharge  HTN: Patient normotensive today -Amlodipine to 10 mg today -Hydralazine 50 mg BID while holding home lisinopril  PAD: Patient's statin escalated to high-intensity during admission, will continue on discharge. Had arterial doppler study yesterday, official read pending. ABI on R = 1.23, L = 1.38.  -F/u arterial doppler study as outpatient -Continue to follow as outpatient, address if vascular intervention as outpatient  First degree AV Block: Chronic. During hospitalization patient's metoprolol held given HF exacerbation and diltiazem switched to amlodipine, which will be continued as outpatient.  -Cardiology consulted recommendations appreciated  Diabetes: On metformin $RemoveBefo'1000mg'YesUwzMOMLh$  BID, Levemir 24units qhs, Novolog 10units TID as outpatient. Yesterday afternoon CBG elevated above 200, most recent  CBG appropriate.  -CBG monitoring TID and qhs -Lantus 10U qhs -Novolog 4U TID and SSI-M  FEN/GI: -Heart Healthy/Carb Modified diet with 1.2L volume restriction -No IVF, replace electrolytes as needed -Senna 1-2 tablets BID PRN and  miralax PRN for constipation  VTE Prophylaxis: Lovenox daily Code Status: Full  Dispo: Anticipated discharge to SNF today.  Thomasene Ripple, MD 06/26/2017, 10:06 AM Pager: [phone]

## 2017-06-26 NOTE — Clinical Social Work Placement (Signed)
   CLINICAL SOCIAL WORK PLACEMENT  NOTE  Date:  06/26/2017  Patient Details  Name: [name] MRN: [id number] Date of Birth: [birth year]  Clinical Social Work is seeking post-discharge placement for this patient at the [place] level of care (*CSW will initial, date and re-position this form in  chart as items are completed):  Yes   Patient/family provided with [place] Work Department's list of facilities offering this level of care within the geographic area requested by the patient (or if unable, by the patient's family).  Yes   Patient/family informed of their freedom to choose among providers that offer the needed level of care, that participate in Medicare, Medicaid or managed care program needed by the patient, have an available bed and are willing to accept the patient.  Yes   Patient/family informed of [facility] ownership interest in [facility] and [facility], as well as of the fact that they are under no obligation to receive care at these facilities.  PASRR submitted to EDS on       PASRR number received on       Existing PASRR number confirmed on 06/24/17     FL2 transmitted to all facilities in geographic area requested by pt/family on 06/23/17     FL2 transmitted to all facilities within larger geographic area on       Patient informed that his/her managed care company has contracts with or will negotiate with certain facilities, including the following:  [facility]     Yes   Patient/family informed of bed offers received.  Patient chooses bed at [facility]     Physician recommends and patient chooses bed at      Patient to be transferred to [facility] on 06/26/17.  Patient to be transferred to facility by [facility]     Patient family notified on 06/26/17 of transfer.  Name of family member notified:  [name]     PHYSICIAN Please prepare prescriptions     Additional Comment:     _______________________________________________ Candie Chroman, LCSW 06/26/2017, 12:37 PM

## 2017-06-26 NOTE — Progress Notes (Signed)
Report was given to the nurse [facility] at [facility].  Idolina Primer, RN

## 2017-06-26 NOTE — Clinical Social Work Note (Signed)
CSW facilitated patient discharge including contacting patient family and facility to confirm patient discharge plans. Clinical information faxed to facility and family agreeable with plan. CSW arranged ambulance transport via [facility] to [facility] RN to call report prior to discharge [phone] Room [phone]).  CSW will sign off for now as social work intervention is no longer needed. Please consult [place] again if new needs arise.  Dayton Scrape, [place]

## 2017-06-26 NOTE — Care Management Important Message (Signed)
Important Message  Patient Details  Name: [name] MRN: [id number] Date of Birth: [birth year]   Medicare Important Message Given:  Yes    Barb Merino Sheva Mcdougle 06/26/2017, 2:59 PM

## 2017-06-28 LAB — CULTURE, BLOOD (ROUTINE X 2)
Culture: NO GROWTH
Culture: NO GROWTH
Special Requests: ADEQUATE

## 2017-06-29 DIAGNOSIS — E119 Type 2 diabetes mellitus without complications: Secondary | ICD-10-CM | POA: Diagnosis not present

## 2017-06-29 DIAGNOSIS — I10 Essential (primary) hypertension: Secondary | ICD-10-CM | POA: Diagnosis not present

## 2017-06-29 DIAGNOSIS — I5030 Unspecified diastolic (congestive) heart failure: Secondary | ICD-10-CM | POA: Diagnosis not present

## 2017-06-29 DIAGNOSIS — I739 Peripheral vascular disease, unspecified: Secondary | ICD-10-CM | POA: Diagnosis not present

## 2017-06-30 ENCOUNTER — Encounter: Payer: Self-pay | Admitting: Physician Assistant

## 2017-06-30 DIAGNOSIS — B029 Zoster without complications: Secondary | ICD-10-CM | POA: Diagnosis not present

## 2017-06-30 DIAGNOSIS — M79604 Pain in right leg: Secondary | ICD-10-CM | POA: Diagnosis not present

## 2017-06-30 DIAGNOSIS — M6281 Muscle weakness (generalized): Secondary | ICD-10-CM | POA: Diagnosis not present

## 2017-06-30 DIAGNOSIS — M79605 Pain in left leg: Secondary | ICD-10-CM | POA: Diagnosis not present

## 2017-06-30 DIAGNOSIS — Z794 Long term (current) use of insulin: Secondary | ICD-10-CM | POA: Diagnosis not present

## 2017-06-30 DIAGNOSIS — E119 Type 2 diabetes mellitus without complications: Secondary | ICD-10-CM | POA: Diagnosis not present

## 2017-06-30 DIAGNOSIS — I5030 Unspecified diastolic (congestive) heart failure: Secondary | ICD-10-CM | POA: Diagnosis not present

## 2017-07-01 ENCOUNTER — Ambulatory Visit: Payer: Medicare Other | Admitting: Physician Assistant

## 2017-07-01 ENCOUNTER — Other Ambulatory Visit: Payer: Medicare Other
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
## 2017-07-01 NOTE — Telephone Encounter (Signed)
PT kept Appt on 06/18/2017.

## 2017-07-02 DIAGNOSIS — M6281 Muscle weakness (generalized): Secondary | ICD-10-CM | POA: Diagnosis not present

## 2017-07-02 DIAGNOSIS — M79604 Pain in right leg: Secondary | ICD-10-CM | POA: Diagnosis not present

## 2017-07-02 DIAGNOSIS — M79605 Pain in left leg: Secondary | ICD-10-CM | POA: Diagnosis not present

## 2017-07-02 DIAGNOSIS — N182 Chronic kidney disease, stage 2 (mild): Secondary | ICD-10-CM | POA: Diagnosis not present

## 2017-07-02 DIAGNOSIS — I5030 Unspecified diastolic (congestive) heart failure: Secondary | ICD-10-CM | POA: Diagnosis not present

## 2017-07-03 ENCOUNTER — Ambulatory Visit: Payer: Medicare Other

## 2017-07-21 ENCOUNTER — Encounter (HOSPITAL_COMMUNITY): Payer: Self-pay | Admitting: Internal Medicine

## 2017-07-21 ENCOUNTER — Observation Stay (HOSPITAL_COMMUNITY)
Admission: EM | Admit: 2017-07-21 | Discharge: 2017-07-23 | Disposition: A | Discharge status: Home / Self Care | Payer: Medicare Other | Attending: Internal Medicine | Admitting: Internal Medicine

## 2017-07-21 ENCOUNTER — Emergency Department (HOSPITAL_COMMUNITY): Payer: Medicare Other

## 2017-07-21 ENCOUNTER — Other Ambulatory Visit: Payer: Self-pay

## 2017-07-21 DIAGNOSIS — E872 Acidosis, unspecified: Secondary | ICD-10-CM

## 2017-07-21 DIAGNOSIS — E1151 Type 2 diabetes mellitus with diabetic peripheral angiopathy without gangrene: Secondary | ICD-10-CM | POA: Diagnosis not present

## 2017-07-21 DIAGNOSIS — I5032 Chronic diastolic (congestive) heart failure: Secondary | ICD-10-CM | POA: Diagnosis not present

## 2017-07-21 DIAGNOSIS — I4430 Unspecified atrioventricular block: Secondary | ICD-10-CM | POA: Diagnosis not present

## 2017-07-21 DIAGNOSIS — Z7982 Long term (current) use of aspirin: Secondary | ICD-10-CM | POA: Insufficient documentation

## 2017-07-21 DIAGNOSIS — I2510 Atherosclerotic heart disease of native coronary artery without angina pectoris: Secondary | ICD-10-CM | POA: Insufficient documentation

## 2017-07-21 DIAGNOSIS — T68XXXA Hypothermia, initial encounter: Secondary | ICD-10-CM

## 2017-07-21 DIAGNOSIS — N179 Acute kidney failure, unspecified: Secondary | ICD-10-CM | POA: Diagnosis not present

## 2017-07-21 DIAGNOSIS — E1129 Type 2 diabetes mellitus with other diabetic kidney complication: Secondary | ICD-10-CM | POA: Diagnosis present

## 2017-07-21 DIAGNOSIS — Z951 Presence of aortocoronary bypass graft: Secondary | ICD-10-CM | POA: Insufficient documentation

## 2017-07-21 DIAGNOSIS — I130 Hypertensive heart and chronic kidney disease with heart failure and stage 1 through stage 4 chronic kidney disease, or unspecified chronic kidney disease: Secondary | ICD-10-CM | POA: Diagnosis not present

## 2017-07-21 DIAGNOSIS — E1165 Type 2 diabetes mellitus with hyperglycemia: Secondary | ICD-10-CM

## 2017-07-21 DIAGNOSIS — F329 Major depressive disorder, single episode, unspecified: Secondary | ICD-10-CM | POA: Diagnosis not present

## 2017-07-21 DIAGNOSIS — Z853 Personal history of malignant neoplasm of breast: Secondary | ICD-10-CM | POA: Insufficient documentation

## 2017-07-21 DIAGNOSIS — Z8673 Personal history of transient ischemic attack (TIA), and cerebral infarction without residual deficits: Secondary | ICD-10-CM | POA: Diagnosis not present

## 2017-07-21 DIAGNOSIS — Z794 Long term (current) use of insulin: Secondary | ICD-10-CM | POA: Diagnosis not present

## 2017-07-21 DIAGNOSIS — Z953 Presence of xenogenic heart valve: Secondary | ICD-10-CM | POA: Diagnosis not present

## 2017-07-21 DIAGNOSIS — E161 Other hypoglycemia: Secondary | ICD-10-CM | POA: Diagnosis not present

## 2017-07-21 DIAGNOSIS — E785 Hyperlipidemia, unspecified: Secondary | ICD-10-CM | POA: Insufficient documentation

## 2017-07-21 DIAGNOSIS — E11649 Type 2 diabetes mellitus with hypoglycemia without coma: Principal | ICD-10-CM | POA: Insufficient documentation

## 2017-07-21 DIAGNOSIS — N183 Chronic kidney disease, stage 3 (moderate): Secondary | ICD-10-CM | POA: Diagnosis not present

## 2017-07-21 DIAGNOSIS — E1122 Type 2 diabetes mellitus with diabetic chronic kidney disease: Secondary | ICD-10-CM | POA: Insufficient documentation

## 2017-07-21 DIAGNOSIS — F32A Depression, unspecified: Secondary | ICD-10-CM | POA: Diagnosis present

## 2017-07-21 DIAGNOSIS — I5033 Acute on chronic diastolic (congestive) heart failure: Secondary | ICD-10-CM | POA: Diagnosis present

## 2017-07-21 DIAGNOSIS — I447 Left bundle-branch block, unspecified: Secondary | ICD-10-CM | POA: Diagnosis not present

## 2017-07-21 DIAGNOSIS — Z79899 Other long term (current) drug therapy: Secondary | ICD-10-CM | POA: Insufficient documentation

## 2017-07-21 DIAGNOSIS — R2689 Other abnormalities of gait and mobility: Secondary | ICD-10-CM | POA: Insufficient documentation

## 2017-07-21 DIAGNOSIS — N182 Chronic kidney disease, stage 2 (mild): Secondary | ICD-10-CM | POA: Diagnosis present

## 2017-07-21 DIAGNOSIS — E162 Hypoglycemia, unspecified: Secondary | ICD-10-CM

## 2017-07-21 DIAGNOSIS — R410 Disorientation, unspecified: Secondary | ICD-10-CM | POA: Diagnosis not present

## 2017-07-21 DIAGNOSIS — R05 Cough: Secondary | ICD-10-CM | POA: Diagnosis not present

## 2017-07-21 DIAGNOSIS — IMO0002 Reserved for concepts with insufficient information to code with codable children: Secondary | ICD-10-CM

## 2017-07-21 DIAGNOSIS — E119 Type 2 diabetes mellitus without complications: Secondary | ICD-10-CM | POA: Diagnosis present

## 2017-07-21 LAB — CBC WITH DIFFERENTIAL/PLATELET
Abs Immature Granulocytes: 0.1 10*3/uL (ref 0.0–0.1)
Basophils Absolute: 0 10*3/uL (ref 0.0–0.1)
Basophils Relative: 0 %
Eosinophils Absolute: 0.1 10*3/uL (ref 0.0–0.7)
Eosinophils Relative: 1 %
HCT: 41 % (ref 36.0–46.0)
Hemoglobin: 13.1 g/dL (ref 12.0–15.0)
Immature Granulocytes: 1 %
Lymphocytes Relative: 9 %
Lymphs Abs: 0.9 10*3/uL (ref 0.7–4.0)
MCH: 30.5 pg (ref 26.0–34.0)
MCHC: 32 g/dL (ref 30.0–36.0)
MCV: 95.3 fL (ref 78.0–100.0)
Monocytes Absolute: 0.6 10*3/uL (ref 0.1–1.0)
Monocytes Relative: 6 %
Neutro Abs: 8.2 10*3/uL — ABNORMAL HIGH (ref 1.7–7.7)
Neutrophils Relative %: 83 %
Platelets: 257 10*3/uL (ref 150–400)
RBC: 4.3 MIL/uL (ref 3.87–5.11)
RDW: 13.5 % (ref 11.5–15.5)
WBC: 9.8 10*3/uL (ref 4.0–10.5)

## 2017-07-21 LAB — COMPREHENSIVE METABOLIC PANEL
ALT: 10 U/L — ABNORMAL LOW (ref 14–54)
AST: 17 U/L (ref 15–41)
Albumin: 3.6 g/dL (ref 3.5–5.0)
Alkaline Phosphatase: 98 U/L (ref 38–126)
Anion gap: 11 (ref 5–15)
BUN: 19 mg/dL (ref 6–20)
CO2: 28 mmol/L (ref 22–32)
Calcium: 9.7 mg/dL (ref 8.9–10.3)
Chloride: 98 mmol/L — ABNORMAL LOW (ref 101–111)
Creatinine, Ser: 1.59 mg/dL — ABNORMAL HIGH (ref 0.44–1.00)
GFR calc Af Amer: 36 mL/min — ABNORMAL LOW (ref >60)
GFR calc non Af Amer: 31 mL/min — ABNORMAL LOW (ref >60)
Glucose, Bld: 167 mg/dL — ABNORMAL HIGH (ref 65–99)
Potassium: 4.5 mmol/L (ref 3.5–5.1)
Sodium: 137 mmol/L (ref 135–145)
Total Bilirubin: 0.5 mg/dL (ref 0.3–1.2)
Total Protein: 8 g/dL (ref 6.5–8.1)

## 2017-07-21 LAB — I-STAT TROPONIN, ED: Troponin i, poc: 0.01 ng/mL (ref 0.00–0.08)

## 2017-07-21 LAB — CBG MONITORING, ED
Glucose-Capillary: 98 mg/dL (ref 65–99)
Glucose-Capillary: 118 mg/dL — ABNORMAL HIGH (ref 65–99)
Glucose-Capillary: 113 mg/dL — ABNORMAL HIGH (ref 65–99)

## 2017-07-21 LAB — URINALYSIS, ROUTINE W REFLEX MICROSCOPIC
Bacteria, UA: NONE SEEN
Bilirubin Urine: NEGATIVE
Glucose, UA: NEGATIVE mg/dL
Hgb urine dipstick: NEGATIVE
Ketones, ur: NEGATIVE mg/dL
Leukocytes, UA: NEGATIVE
Nitrite: NEGATIVE
Protein, ur: 30 mg/dL — AB
Specific Gravity, Urine: 1.013 (ref 1.005–1.030)
pH: 5 (ref 5.0–8.0)

## 2017-07-21 LAB — I-STAT CG4 LACTIC ACID, ED
Lactic Acid, Venous: 1.92 mmol/L — ABNORMAL HIGH (ref 0.5–1.9)
Lactic Acid, Venous: 2.62 mmol/L (ref 0.5–1.9)

## 2017-07-21 LAB — BRAIN NATRIURETIC PEPTIDE: B Natriuretic Peptide: 135.7 pg/mL — ABNORMAL HIGH (ref 0.0–100.0)

## 2017-07-21 LAB — TSH: TSH: 3.932 u[IU]/mL (ref 0.350–4.500)

## 2017-07-21 LAB — LIPASE, BLOOD: Lipase: 27 U/L (ref 11–51)

## 2017-07-21 MED ORDER — PIPERACILLIN-TAZOBACTAM 3.375 G IVPB: 3.3750 g | Freq: Three times a day (TID) | INTRAVENOUS | Status: DC

## 2017-07-21 MED ORDER — ATORVASTATIN CALCIUM 80 MG PO TABS
80.0000 mg | ORAL_TABLET | Freq: Every day | ORAL | Status: DC
  Administered 2017-07-22: 80 mg via ORAL
  Filled 2017-07-21 (×2): qty 1

## 2017-07-21 MED ORDER — POLYETHYLENE GLYCOL 3350 17 G PO PACK
17.0000 g | PACK | Freq: Every day | ORAL | Status: DC
  Administered 2017-07-22 – 2017-07-23 (×2): 17 g via ORAL
  Filled 2017-07-21 (×2): qty 1

## 2017-07-21 MED ORDER — METOPROLOL TARTRATE 25 MG PO TABS
25.0000 mg | ORAL_TABLET | Freq: Every day | ORAL | Status: DC
  Administered 2017-07-22 – 2017-07-23 (×2): 25 mg via ORAL
  Filled 2017-07-21 (×2): qty 1

## 2017-07-21 MED ORDER — AMLODIPINE BESYLATE 10 MG PO TABS
10.0000 mg | ORAL_TABLET | Freq: Every day | ORAL | Status: DC
  Administered 2017-07-22 – 2017-07-23 (×2): 10 mg via ORAL
  Filled 2017-07-21: qty 2
  Filled 2017-07-21: qty 1

## 2017-07-21 MED ORDER — VANCOMYCIN HCL 10 G IV SOLR
1500.0000 mg | Freq: Once | INTRAVENOUS | Status: AC
  Administered 2017-07-21: 1500 mg via INTRAVENOUS
  Filled 2017-07-21: qty 1500

## 2017-07-21 MED ORDER — SODIUM CHLORIDE 0.9 % IV SOLN: Freq: Once | INTRAVENOUS | Status: DC

## 2017-07-21 MED ORDER — PIPERACILLIN-TAZOBACTAM 3.375 G IVPB 30 MIN
3.3750 g | Freq: Once | INTRAVENOUS | Status: AC
  Administered 2017-07-21: 3.375 g via INTRAVENOUS
  Filled 2017-07-21: qty 50

## 2017-07-21 MED ORDER — VANCOMYCIN HCL 10 G IV SOLR: 1250.0000 mg | INTRAVENOUS | Status: DC

## 2017-07-21 MED ORDER — SODIUM CHLORIDE 0.9 % IV BOLUS
1000.0000 mL | Freq: Once | INTRAVENOUS | Status: AC
  Administered 2017-07-21: 1000 mL via INTRAVENOUS

## 2017-07-21 MED ORDER — HYDRALAZINE HCL 50 MG PO TABS
50.0000 mg | ORAL_TABLET | Freq: Two times a day (BID) | ORAL | Status: DC
  Administered 2017-07-22 – 2017-07-23 (×4): 50 mg via ORAL
  Filled 2017-07-21: qty 1
  Filled 2017-07-21: qty 2
  Filled 2017-07-21 (×2): qty 1

## 2017-07-21 MED ORDER — GUAIFENESIN 100 MG/5ML PO SOLN
5.0000 mL | ORAL | Status: DC | PRN
  Administered 2017-07-23: 100 mg via ORAL
  Filled 2017-07-21 (×2): qty 5

## 2017-07-21 MED ORDER — VANCOMYCIN HCL IN DEXTROSE 1-5 GM/200ML-% IV SOLN: 1000.0000 mg | Freq: Once | INTRAVENOUS | Status: DC

## 2017-07-21 MED ORDER — PAROXETINE HCL 20 MG PO TABS
20.0000 mg | ORAL_TABLET | Freq: Every day | ORAL | Status: DC
  Administered 2017-07-22 – 2017-07-23 (×2): 20 mg via ORAL
  Filled 2017-07-21 (×2): qty 1

## 2017-07-21 MED ORDER — IPRATROPIUM-ALBUTEROL 0.5-2.5 (3) MG/3ML IN SOLN
3.0000 mL | Freq: Once | RESPIRATORY_TRACT | Status: AC
  Administered 2017-07-21: 3 mL via RESPIRATORY_TRACT
  Filled 2017-07-21: qty 3

## 2017-07-21 MED ORDER — NAPHAZOLINE-GLYCERIN 0.012-0.2 % OP SOLN
1.0000 [drp] | Freq: Four times a day (QID) | OPHTHALMIC | Status: DC | PRN
  Filled 2017-07-21: qty 15

## 2017-07-21 MED ORDER — LORATADINE 10 MG PO TABS
10.0000 mg | ORAL_TABLET | Freq: Every day | ORAL | Status: DC
  Administered 2017-07-22 – 2017-07-23 (×2): 10 mg via ORAL
  Filled 2017-07-21 (×2): qty 1

## 2017-07-21 MED ORDER — INSULIN ASPART 100 UNIT/ML ~~LOC~~ SOLN
0.0000 [IU] | Freq: Three times a day (TID) | SUBCUTANEOUS | Status: DC
  Administered 2017-07-22 – 2017-07-23 (×2): 1 [IU] via SUBCUTANEOUS

## 2017-07-21 MED ORDER — ENOXAPARIN SODIUM 40 MG/0.4ML ~~LOC~~ SOLN
40.0000 mg | SUBCUTANEOUS | Status: DC
  Administered 2017-07-22 – 2017-07-23 (×2): 40 mg via SUBCUTANEOUS
  Filled 2017-07-21 (×2): qty 0.4

## 2017-07-21 MED ORDER — ASPIRIN EC 81 MG PO TBEC
81.0000 mg | DELAYED_RELEASE_TABLET | Freq: Every day | ORAL | Status: DC
  Administered 2017-07-22 – 2017-07-23 (×2): 81 mg via ORAL
  Filled 2017-07-21 (×2): qty 1

## 2017-07-21 MED ORDER — ACETAMINOPHEN 650 MG RE SUPP: 650.0000 mg | Freq: Four times a day (QID) | RECTAL | Status: DC | PRN

## 2017-07-21 MED ORDER — ACETAMINOPHEN 325 MG PO TABS
650.0000 mg | ORAL_TABLET | Freq: Four times a day (QID) | ORAL | Status: DC | PRN
  Administered 2017-07-22 – 2017-07-23 (×2): 650 mg via ORAL
  Filled 2017-07-21 (×2): qty 2

## 2017-07-21 NOTE — ED Notes (Addendum)
CBG 98

## 2017-07-21 NOTE — ED Provider Notes (Signed)
[facility] EMERGENCY DEPARTMENT Provider Note   CSN: [id number] Arrival date & time: 07/21/17  1952     History   Chief Complaint No chief complaint on file.   HPI [name] is a 72 y.o. female who presents the emergency department for hypoglycemia.  Patient was taken out to dinner by her family but refused to eat.  She states that she has had several days of anorexia and no appetite.  She became confused and EMS was called.  They found that her blood sugar was at about 37.  She was given D10 with improvement in her sugars back to 98.  The patient has had a productive cough for several weeks.  She denies chest pain shortness of breath urinary symptoms.  HPI  Past Medical History:  Diagnosis Date  . Adenocarcinoma of breast ([place]) 1996   Completed tamoxifen and had mastectomy.  . Aortic stenosis, severe    s/p aortic valve replacement with porcine valve 06/2004.  ECHO 2010 EF 35%, LVH, diastolic dysfxn, Bioprostetic aoritc valve, mild AS. ECHO 2013 EF 60%, Nl aortic artificial valve, dynamic obstruction in the outflow tract   Class IIb rec for annual TTE after 5 yrs. She had a TTE 2013    . Arthritis   . CAD (coronary artery disease) 2006   s/p CABG (5/06) w/ saphenous vein to RCA at time of AVR  . CVA (cerebral infarction) 2006   Post-op from AVR. Presumed embolic in nature. Carotid stenosis of R 60-79%. Repeat dopplers 4/10 no R stenosis and L stenosis of 1-29%.  . Depression    Controlled on Paxil  . Diabetes mellitus 1992   Dx 04/25/1990. Now insulin dependent, started 2008. On ACEI.   . Diverticulosis 2001  . Hyperlipidemia    Mgmt with a statin  . Hypertension    Requires 4 drug tx  . Osteoporosis 2006   DEXA 10/06 : L femur T -2.8, R -2.7. Lumbar T -2.4. On bisphosphonates and  Calcium / Vit D.  . Refusal of blood transfusions as patient is Jehovah's Witness     Patient Active Problem List   Diagnosis Date Noted  . Acute exacerbation of CHF  (congestive heart failure) ([place]) 06/23/2017  . Chronic diastolic CHF (congestive heart failure) ([place]) 06/22/2017  . Peripheral arterial disease ([place]) 06/18/2017  . B12 deficiency 05/01/2017  . Bilateral lower extremity edema 11/08/2015  . Aortic atherosclerosis ([place]) 06/07/2014  . Abnormality of gait 05/29/2012  . HOCM (hypertrophic obstructive cardiomyopathy) ([place]) 06/11/2011  . CKD (chronic kidney disease) stage 2, GFR 60-89 ml/min 06/03/2011  . Routine health maintenance 06/10/2010  . Hyperlipidemia   . Refusal of blood transfusions as patient is Jehovah's Witness   . History of adenocarcinoma of breast   . Osteoporosis   . Status post CVA   . Aortic stenosis s/p Tissure AVR 2006   . CAD (coronary artery disease)   . Hypertension   . Depression 01/23/2006  . Diabetes mellitus with renal manifestations, controlled ([place]) 03/25/1990    Past Surgical History:  Procedure Laterality Date  . ABDOMINAL HYSTERECTOMY  1987   for fibroids  . AORTIC VALVE REPLACEMENT  2006  . CHOLECYSTECTOMY    . CORONARY ARTERY BYPASS GRAFT  2006   Saphenous vein to RCA at time of AVR. Course complicated by acute respiratory failure, post-op PTX, ARI, ileus, CVA  . MASTECTOMY Left 1995   L for adenocarcinoma     OB History   None  Home Medications    Prior to Admission medications   Medication Sig Start Date End Date Taking? Authorizing Provider  ACCU-CHEK FASTCLIX LANCETS MISC Use to check blood sugar up to 3 times a day 04/01/17   [name], MD  acetaminophen (TYLENOL) 325 MG tablet Take 650 mg by mouth every 6 (six) hours as needed for moderate pain.    [provider]  amLODipine (NORVASC) 10 MG tablet Take 1 tablet (10 mg total) by mouth daily. 06/26/17   Thomasene Ripple, MD  aspirin 81 MG EC tablet TAKE 1 TABLET (81 MG TOTAL) BY MOUTH DAILY. SWALLOW WHOLE. 03/31/17   [name], MD  atorvastatin (LIPITOR) 80 MG tablet Take 1 tablet (80 mg total) by mouth  daily at 6 PM. 06/26/17   Nedrud, Larena Glassman, MD  docusate sodium (COLACE) 100 MG capsule Take 200 mg by mouth as needed for mild constipation.    [provider]  furosemide (LASIX) 40 MG tablet Take 1 tablet (40 mg total) by mouth 2 (two) times daily. 06/26/17   Thomasene Ripple, MD  glucose blood (ACCU-CHEK GUIDE) test strip Check blood sugar 3 times a day as instructed 04/01/17   [name], MD  hydrALAZINE (APRESOLINE) 50 MG tablet Take 1 tablet (50 mg total) by mouth 2 (two) times daily. 06/26/17   Thomasene Ripple, MD  insulin aspart (NOVOLOG FLEXPEN) 100 UNIT/ML FlexPen Inject 10 Units into the skin 3 (three) times daily with meals. 03/31/17   [name], MD  Insulin Detemir (LEVEMIR FLEXTOUCH) 100 UNIT/ML Pen Inject 24 Units into the skin at bedtime. 03/31/17   [name], MD  Insulin Pen Needle (PEN NEEDLES 3/16") 31G X 5 MM MISC 1 each by Does not apply route 4 (four) times daily. Use to check blood sugars 4 times a day. Dx code E11.22 03/31/17   [name], MD  Loratadine 10 MG CAPS Take 1 capsule (10 mg total) by mouth daily. 06/07/14   [name], MD  metFORMIN (GLUCOPHAGE) 1000 MG tablet Take 1 tablet (1,000 mg total) by mouth 2 (two) times daily with a meal. 03/31/17   [name], MD  metoprolol succinate (TOPROL-XL) 25 MG 24 hr tablet Take 1 tablet (25 mg total) by mouth daily. 03/31/17   [name], MD  PARoxetine (PAXIL) 20 MG tablet Take 1 tablet (20 mg total) by mouth daily. 03/31/17   [name], MD  polyethylene glycol [facility] / Floria Raveling) packet Take 17 g by mouth daily. 06/18/17   [name], MD  tetrahydrozoline 0.05 % ophthalmic solution Place 1 drop into both eyes as needed (dry eyes).    [provider]    Family History Family History  Problem Relation Age of Onset  . Diabetes Mother   . Hypertension Mother   . Alzheimer's disease Mother   . Heart disease Father 37       AMI at age  58 and 82  . Mental illness Sister   . Heart disease Sister 13       AMI  . Kidney disease Sister     Social History Social History   Tobacco Use  . Smoking status: Never Smoker  . Smokeless tobacco: Never Used  Substance Use Topics  . Alcohol use: Yes    Alcohol/week: 0.0 oz    Comment: Occasional beer, monthly  . Drug use: No     Allergies   Patient has no known allergies.  Review of Systems Review of Systems  Ten systems reviewed and are negative for acute change, except as noted in the HPI.   Physical Exam Updated Vital Signs There were no vitals taken for this visit.  Physical Exam  Constitutional: She is oriented to person, place, and time. She appears well-developed and well-nourished. No distress.  Appears fatigued  HENT:  Head: Normocephalic and atraumatic.  Eyes: Conjunctivae are normal. No scleral icterus.  Neck: Normal range of motion.  Cardiovascular: Normal rate, regular rhythm and normal heart sounds. Exam reveals no gallop and no friction rub.  No murmur heard. Pulmonary/Chest: Effort normal. No respiratory distress. She has wheezes.  Patient with bilateral wheezing, Rales and productive cough  Abdominal: Soft. Bowel sounds are normal. She exhibits no distension and no mass. There is no tenderness. There is no guarding.  Neurological: She is alert and oriented to person, place, and time.  Skin: Skin is warm and dry. She is not diaphoretic.  Psychiatric: Her behavior is normal.  Nursing note and vitals reviewed.    ED Treatments / Results  Labs (all labs ordered are listed, but only abnormal results are displayed) Labs Reviewed  CBG MONITORING, ED    EKG None  Radiology No results found.  Procedures Procedures (including critical care time)  Medications Ordered in ED Medications - No data to display   Initial Impression / Assessment and Plan / ED Course  I have reviewed the triage vital signs and the nursing notes.  Pertinent  labs & imaging results that were available during my care of the patient were reviewed by me and considered in my medical decision making (see chart for details).  Clinical Course as of Jul 21 2157  Tue Jul 21, 2017  2010    Temp(!): 95.1 F (35.1 C) [AH]  2129 Lactic Acid, Venous(!!): 2.62 [AH]    Clinical Course User Index [AH] Margarita Mail, PA-C    Patient EKG unchanged from previous with an old left bundle branch block.  She does not have any chest pain.  She will be admitted by the internal medicine teaching service.  Final Clinical Impressions(s) / ED Diagnoses   Final diagnoses:  Hypoglycemia  Hypothermia, initial encounter  Lactic acidosis    ED Discharge Orders    None       Margarita Mail, PA-C 07/21/17 2222    Valarie Merino, MD 07/21/17 2251

## 2017-07-21 NOTE — ED Notes (Addendum)
Temperature 95.1 rectal. Bear hugger initiated.

## 2017-07-21 NOTE — ED Triage Notes (Signed)
Pt arriving via [facility] from [facility].  Facility reports that pt did not want to eat dinner and was acting confused.  When they checked her CBG it was 37, pt was conscious and drank 2 cups of juice and 1 cracker but refused anything other food.  When ems arrived they gave 25g of D10 and her CBG was 98. Pt was A&O for EMS.  Pt currently reports a cough that has been occurring for a few days and she reports "not being able to think clearly".

## 2017-07-21 NOTE — H&P (Signed)
Date: 07/22/2017               Patient Name:  [name] MRN: [id number]  DOB: [birth year] Age / Sex: 72 y.o., female   PCP: Audrey Crews, MD         Medical Service: Internal Medicine Teaching Service         Attending Physician: Dr. Rebeca Alert Raynaldo Opitz, MD    First Contact: Dr. Berneice Gandy Pager: [phone]  Second Contact: Dr. Reesa Chew Pager: [phone]       After Hours (After 5p/  First Contact Pager: [phone]  weekends / holidays): Second Contact Pager: [phone]   Chief Complaint: Hypoglycemia  History of Present Illness: [name] is a 72 yo F with HX of HFpEF, HOCM, AS (s/p AVR in 2006), CAD, CKD II, DM, HTN, PAD, and CVA who presented with hypoglycemia. Patient presented via EMS from her ALF, [facility], following an episode of hypoglycemia. Patient was noted to be confused and refusing dinner. Her CBG was 33 at facility and she was given juice and crackers (but refuse other food). When EMS arrived she was given 25g of D10 and her CBG was 98. When interviewed by EMS, patient was alert and oriented. Patient states that she continues to take insulin at her facility and they do check her blood sugar. There was a period of time when they were not checking her sugar everyday, but after a family member noticed they started checking it every day.  Patient states she has had a decreased appetite since she left the hospital on 06/26/17. She had been at [facility] place for 3 weeks for rehab prior to going to her ALF.  She states that on a typical day she will eat some grits for breakfast, the vegetables that are served with lunch and dinner, and some crackers and other snacks. She states she has decreased craving for food and the food seems to not taste as good. She denies and pain with eating or difficulty swallowing. She endorses less enjoyment and some feeling of depression since being in the facilities. She states she feels even more deconditioned and no longer gets up and walks  with a walker as she does not have the lift chair (to help her stand) that she had at home and feels as though she is too weak to be able to walk even if she did.  Patient also endorses cough productive of whitish sputum for the past 4 days. She endorses chills, Chest and throat congestion, feeling tired, and some bloating. She denies fevers, Dyspnea, Chest Pain, Nausea, Vomiting, constipation, diarrhea, headache, lightheadedness,  Sick contacts, or new aches.  In the ED, Vitals signs significant for rectal temp of 95.1. CBC WNL, CMP showed Cr 1.59 (has been 1.3-1.5 for past 2 months, 1.1-1.2 prior to that); BNP 135; Troponin 0.01; Lipase WNL; UA WNL, Lactate 2.62 (Improved to 1.92 with IVF). Blood cultures were obtained. EKG showed sinus rhythm with intraventricular conduction delay and prolonged qt. CXR showed no acute abnormality. She received Duoneb, Vanc, Zosyn, and 1.5L IVF. Patient is to be admitted for further workup and care.  Meds:  Current Meds  Medication Sig  . acetaminophen (TYLENOL) 325 MG tablet Take 650 mg by mouth 4 (four) times daily as needed for mild pain or headache.   Marland Kitchen amLODipine (NORVASC) 10 MG tablet Take 1 tablet (10 mg total) by mouth daily.  Marland Kitchen aspirin 81 MG EC tablet TAKE 1 TABLET (81 MG TOTAL)  BY MOUTH DAILY. SWALLOW WHOLE.  Marland Kitchen atorvastatin (LIPITOR) 80 MG tablet Take 1 tablet (80 mg total) by mouth daily at 6 PM. (Patient taking differently: Take 80 mg by mouth at bedtime. )  . docusate sodium (COLACE) 100 MG capsule Take 200 mg by mouth daily as needed for mild constipation.   . Emollient (EUCERIN) lotion Apply topically 2 (two) times daily.  . furosemide (LASIX) 20 MG tablet Take 20 mg by mouth 2 (two) times daily.  . hydrALAZINE (APRESOLINE) 50 MG tablet Take 1 tablet (50 mg total) by mouth 2 (two) times daily.  . insulin aspart (NOVOLOG FLEXPEN) 100 UNIT/ML FlexPen Inject 10 Units into the skin 3 (three) times daily with meals.  . Insulin Detemir (LEVEMIR FLEXTOUCH)  100 UNIT/ML Pen Inject 24 Units into the skin at bedtime.  Marland Kitchen loratadine (CLARITIN) 10 MG tablet Take 10 mg by mouth daily.  . metFORMIN (GLUCOPHAGE) 1000 MG tablet Take 1 tablet (1,000 mg total) by mouth 2 (two) times daily with a meal.  . metoprolol tartrate (LOPRESSOR) 25 MG tablet Take 25 mg by mouth daily.  Marland Kitchen PARoxetine (PAXIL) 20 MG tablet Take 1 tablet (20 mg total) by mouth daily.  . polyethylene glycol (MIRALAX / GLYCOLAX) packet Take 17 g by mouth daily.  Marland Kitchen tetrahydrozoline 0.05 % ophthalmic solution Place 1 drop into both eyes as needed (dry eyes).    Allergies: Allergies as of 07/21/2017 - Review Complete 07/21/2017  Allergen Reaction Noted  . Other Other (See Comments) 07/21/2017   Past Medical History:  Diagnosis Date  . Adenocarcinoma of breast ([place]) 1996   Completed tamoxifen and had mastectomy.  . Aortic stenosis, severe    s/p aortic valve replacement with porcine valve 06/2004.  ECHO 2010 EF 21%, LVH, diastolic dysfxn, Bioprostetic aoritc valve, mild AS. ECHO 2013 EF 60%, Nl aortic artificial valve, dynamic obstruction in the outflow tract   Class IIb rec for annual TTE after 5 yrs. She had a TTE 2013    . Arthritis   . CAD (coronary artery disease) 2006   s/p CABG (5/06) w/ saphenous vein to RCA at time of AVR  . CVA (cerebral infarction) 2006   Post-op from AVR. Presumed embolic in nature. Carotid stenosis of R 60-79%. Repeat dopplers 4/10 no R stenosis and L stenosis of 1-29%.  . Depression    Controlled on Paxil  . Diabetes mellitus 1992   Dx 04/25/1990. Now insulin dependent, started 2008. On ACEI.   . Diverticulosis 2001  . Hyperlipidemia    Mgmt with a statin  . Hypertension    Requires 4 drug tx  . Osteoporosis 2006   DEXA 10/06 : L femur T -2.8, R -2.7. Lumbar T -2.4. On bisphosphonates and  Calcium / Vit D.  . Refusal of blood transfusions as patient is Jehovah's Witness     Family History: Family History  Problem Relation Age of Onset  . Diabetes  Mother   . Hypertension Mother   . Alzheimer's disease Mother   . Heart disease Father 1       AMI at age 60 and 51  . Mental illness Sister   . Heart disease Sister 29       AMI  . Kidney disease Sister   - Reviewed on admission  Social History: Social History   Tobacco Use  . Smoking status: Never Smoker  . Smokeless tobacco: Never Used  Substance Use Topics  . Alcohol use: Yes    Alcohol/week: 0.0 oz  Comment: Occasional beer, monthly  . Drug use: No  - Reviewed on admission  Review of Systems: A complete ROS was negative except as per HPI.  Physical Exam: Blood pressure (!) 113/58, pulse 66, temperature 97.9 F (36.6 C), temperature source Oral, resp. rate 16, height $RemoveBe'5\' 7"'PqDyzlgwk$  (1.702 m), weight 178 lb (80.7 kg), SpO2 95 %. Physical Exam  Constitutional: She is oriented to person, place, and time. She appears well-developed and well-nourished. No distress.  HENT:  Head: Normocephalic and atraumatic.  Eyes: EOM are normal. Right eye exhibits no discharge. Left eye exhibits no discharge.  Cardiovascular: Normal rate, regular rhythm and intact distal pulses.  Murmur heard. Systolic Murmur  Pulmonary/Chest: Effort normal. No respiratory distress.  Mild diffuse expiratory wheezing Diffuse rhonchi Right > Left  Abdominal: Soft. Bowel sounds are normal. There is no tenderness.  Mildly distended  Musculoskeletal: She exhibits no edema, tenderness or deformity.  Neurological: She is alert and oriented to person, place, and time.  Skin: Skin is warm.   EKG: personally reviewed my interpretation is sinus rhythm with intraventricular conduction delay and prolonged qt  CXR: personally reviewed my interpretation is no acute abnormality  Assessment & Plan by Problem: 72 yo F with HX of HFpEF, HOCM, AS (s/p AVR in 2006), CAD, CKD II, DM, HTN, PAD, and CVA who presented with hypoglycemia.   DM Decreased Appetite Hypoglycemia: Patient presented following an episode of  hypoglycemia at her assisted living facility. She has a history of diabetes (A1c 6.2 on 04/30/17) on Metformin $RemoveBefo'1000mg'SNHtfnaCJMI$  BID, Levemir 24U qhs, and Novolog 10U TID. She states that she has been eating less since her last admission due to a decrease in appetite and food not tasting a good to her. She denies difficulty swallowing or pain as causes for her food aversion. She does endorse a degree of depression since moving it to the facilities, which may be affecting her appetite. - Hold home diabetic medications - CBGs q2h and TID AC - SSI  Hypothermia: Patient presented with rectal temperature of 95.1 and reported sevewral days of chills. This may be 2/2 her hypoglycemia. Differential also includes Abnormal thyroid function, infectious process (given her recent cough symptoms x 4days, though patient has no leukocytosis) and Adrenal insufficiency (less likely as patient is without significant electrolyte abnormalities or hypotension). Patient warmed with Bair hugger in ED. - TSH  - AM BMP  Deconditioning: Patient reports further deconditioning since being at her facilities and eating less. Feels as though she would not be able to walk with a walker if she tried, which she could do after assistance standing prior to her last admission.  - PT/OT Consult  Cough and Congestion: Patient reports 4 days of cough productive of whitish sputum with associated chest and throat congestion. No Dyspnea, Sick contacts, or new aches. On exam patient has diffuse rhonchi R>L with mild diffuse wheezing. CXR is does not show acute changes and there is no leukocytosis. Patients symptoms may be 2/2 viral URI/Pneumonia or (less likely) Atypical pneumonia. > PSI 104 (Age 2 with Hx of CKD, CVD, CHF, Nursing home resident) > Received Vanc and Zosyn in ED - Guaifenesin $RemoveBefor'100mg'OZWtFpvNElVj$  q4h PRN - Home Claritin $RemoveBefor'10mg'YvzBFtqmeOqA$  Daily - Transition to Azithromycin to cover atypical - RVP - Am CBC  Depression: On Paxil $Remove'20mg'CiuXxkb$  Daily. She endorse some  worsening depression since moving into rehab and then assisted living in the past month. This may be playing a role in her decreased appetite above. - Continue home Paxil $RemoveBe'20mg'FdWUDIHev$   Daily   CKD II: Cr 1.59 (has been 1.3-1.5 for past 2 months, 1.1-1.2 prior to that). Patient may have a new baseline and have advanced to CKD 3 given her recent reading. Will monitor for improvement with IVF. - AM BMP  HFpEF: On Furosemide 20mg  Daily, Metoprolol 25mg  Daily - Continue home Metoprolol 25mg  Daily - Hold Furosemide in setting of poor intake and elevated Lactate  HTN - Continue home Amlodipine 10mg  Daily, Hydralazine 50mg  BID, and Metoprolol 15mg  Daily - Holding home lasix due to poor intake and elevated lactate as above  FEN:CM Diet,  VTE ppx: Lovenox Code Status: FULL  Dispo: Admit patient to Inpatient with expected length of stay greater than 2 midnights.  Signed: Neva Seat, MD 07/22/2017, 12:42 AM  Pager: [phone]

## 2017-07-21 NOTE — Progress Notes (Signed)
Pharmacy Antibiotic Note  [name] is a 72 y.o. female admitted on 07/21/2017 with sepsis.  Pharmacy has been consulted for vancomycin and zosyn dosing. Pt is hypothermic and WBC is WNL. SCr is slightly elevated above baseline at 1.59. Lactic acid is elevated.   Plan: Vancomycin $RemoveBeforeDE'1500mg'EHvzVIFSykrwNOk$  IV x 1 then $Remo'1250mg'UXcEM$  IV Q24H Zosyn 3.375gm IV Q8H (4 hr inf) F/u renal fxn, C&S, clinical status and trough at SS  Height: $Remove'5\' 7"'lVAnPLz$  (170.2 cm) Weight: 178 lb (80.7 kg) IBW/kg (Calculated) : 61.6  Temp (24hrs), Avg:95.1 F (35.1 C), Min:95.1 F (35.1 C), Max:95.1 F (35.1 C)  Recent Labs  Lab 07/21/17 2056  LATICACIDVEN 2.62*    CrCl cannot be calculated (Patient's most recent lab result is older than the maximum 21 days allowed.).    Allergies  Allergen Reactions  . 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," as the patient is a Jehovah's Witness    Antimicrobials this admission: Vanc 5/28>> Zosyn 5/28>>  Dose adjustments this admission: N/A  Microbiology results: Pending  Thank you for allowing pharmacy to be a part of this patient's care.  Cieanna Stormes, Rande Lawman 07/21/2017 9:10 PM

## 2017-07-22 ENCOUNTER — Encounter (HOSPITAL_COMMUNITY): Payer: Self-pay | Admitting: Internal Medicine

## 2017-07-22 DIAGNOSIS — D649 Anemia, unspecified: Secondary | ICD-10-CM

## 2017-07-22 DIAGNOSIS — I5030 Unspecified diastolic (congestive) heart failure: Secondary | ICD-10-CM

## 2017-07-22 DIAGNOSIS — I130 Hypertensive heart and chronic kidney disease with heart failure and stage 1 through stage 4 chronic kidney disease, or unspecified chronic kidney disease: Secondary | ICD-10-CM

## 2017-07-22 DIAGNOSIS — N179 Acute kidney failure, unspecified: Secondary | ICD-10-CM | POA: Diagnosis present

## 2017-07-22 DIAGNOSIS — F329 Major depressive disorder, single episode, unspecified: Secondary | ICD-10-CM | POA: Diagnosis not present

## 2017-07-22 DIAGNOSIS — E162 Hypoglycemia, unspecified: Secondary | ICD-10-CM | POA: Diagnosis not present

## 2017-07-22 DIAGNOSIS — J9690 Respiratory failure, unspecified, unspecified whether with hypoxia or hypercapnia: Secondary | ICD-10-CM

## 2017-07-22 DIAGNOSIS — N183 Chronic kidney disease, stage 3 (moderate): Secondary | ICD-10-CM | POA: Diagnosis not present

## 2017-07-22 DIAGNOSIS — T68XXXA Hypothermia, initial encounter: Secondary | ICD-10-CM

## 2017-07-22 LAB — CBC
HCT: 31.6 % — ABNORMAL LOW (ref 36.0–46.0)
Hemoglobin: 10.1 g/dL — ABNORMAL LOW (ref 12.0–15.0)
MCH: 30.5 pg (ref 26.0–34.0)
MCHC: 32 g/dL (ref 30.0–36.0)
MCV: 95.5 fL (ref 78.0–100.0)
Platelets: 220 10*3/uL (ref 150–400)
RBC: 3.31 MIL/uL — ABNORMAL LOW (ref 3.87–5.11)
RDW: 13.5 % (ref 11.5–15.5)
WBC: 7.4 10*3/uL (ref 4.0–10.5)

## 2017-07-22 LAB — BASIC METABOLIC PANEL
Anion gap: 9 (ref 5–15)
BUN: 17 mg/dL (ref 6–20)
CO2: 27 mmol/L (ref 22–32)
Calcium: 8.5 mg/dL — ABNORMAL LOW (ref 8.9–10.3)
Chloride: 104 mmol/L (ref 101–111)
Creatinine, Ser: 1.37 mg/dL — ABNORMAL HIGH (ref 0.44–1.00)
GFR calc Af Amer: 43 mL/min — ABNORMAL LOW (ref >60)
GFR calc non Af Amer: 37 mL/min — ABNORMAL LOW (ref >60)
Glucose, Bld: 173 mg/dL — ABNORMAL HIGH (ref 65–99)
Potassium: 4.4 mmol/L (ref 3.5–5.1)
Sodium: 140 mmol/L (ref 135–145)

## 2017-07-22 LAB — RESPIRATORY PANEL BY PCR
Adenovirus: NOT DETECTED
Bordetella pertussis: NOT DETECTED
Chlamydophila pneumoniae: NOT DETECTED
Coronavirus 229E: NOT DETECTED
Coronavirus HKU1: NOT DETECTED
Coronavirus NL63: NOT DETECTED
Coronavirus OC43: NOT DETECTED
Influenza A: NOT DETECTED
Influenza B: NOT DETECTED
Metapneumovirus: NOT DETECTED
Mycoplasma pneumoniae: NOT DETECTED
Parainfluenza Virus 1: NOT DETECTED
Parainfluenza Virus 2: NOT DETECTED
Parainfluenza Virus 3: DETECTED — AB
Parainfluenza Virus 4: NOT DETECTED
Respiratory Syncytial Virus: NOT DETECTED
Rhinovirus / Enterovirus: NOT DETECTED

## 2017-07-22 LAB — CBG MONITORING, ED
Glucose-Capillary: 112 mg/dL — ABNORMAL HIGH (ref 65–99)
Glucose-Capillary: 137 mg/dL — ABNORMAL HIGH (ref 65–99)
Glucose-Capillary: 137 mg/dL — ABNORMAL HIGH (ref 65–99)
Glucose-Capillary: 138 mg/dL — ABNORMAL HIGH (ref 65–99)
Glucose-Capillary: 158 mg/dL — ABNORMAL HIGH (ref 65–99)
Glucose-Capillary: 189 mg/dL — ABNORMAL HIGH (ref 65–99)

## 2017-07-22 LAB — GLUCOSE, CAPILLARY
Glucose-Capillary: 200 mg/dL — ABNORMAL HIGH (ref 65–99)
Glucose-Capillary: 164 mg/dL — ABNORMAL HIGH (ref 65–99)

## 2017-07-22 MED ORDER — AZITHROMYCIN 250 MG PO TABS
500.0000 mg | ORAL_TABLET | Freq: Once | ORAL | Status: AC
  Administered 2017-07-22: 500 mg via ORAL
  Filled 2017-07-22: qty 2

## 2017-07-22 MED ORDER — AZITHROMYCIN 500 MG PO TABS: 250.0000 mg | ORAL_TABLET | Freq: Every day | ORAL | Status: DC

## 2017-07-22 MED ORDER — INSULIN DETEMIR 100 UNIT/ML ~~LOC~~ SOLN
12.0000 [IU] | Freq: Every day | SUBCUTANEOUS | Status: DC
  Administered 2017-07-22 – 2017-07-23 (×2): 12 [IU] via SUBCUTANEOUS
  Filled 2017-07-22 (×2): qty 0.12

## 2017-07-22 NOTE — ED Notes (Signed)
Breakfast tray ordered 

## 2017-07-22 NOTE — Progress Notes (Signed)
Patient admitted to unit via stretcher from ED. Patient alert and oriented on purewick. Telemetry applied as ordered. Patient oriented to room. Call bell in place. Will monitor.

## 2017-07-22 NOTE — Evaluation (Addendum)
Physical Therapy Evaluation Patient Details Name: [name] MRN: [id number] DOB: September 14, 1945 Today's Date: 07/22/2017   History of Present Illness  Pt is a 72 y/o female admitted secondary to hypoglycemia. Chest imaging negative. PMH includes CKD 3, CAD s/p CABG, HTN, DM, PAD, CVA, and s/p AVR.  Clinical Impression  Pt admitted secondary to problem above with deficits below. Pt presenting with weakness and decreased balance and required max A to stand at EOB. Pt reports she is from ALF, but feels she has gotten weaker since returning from SNF. Will continue to follow acutely to maximize functional mobility independence and safety.      Follow Up Recommendations SNF;Supervision/Assistance - 24 hour    Equipment Recommendations  None recommended by PT    Recommendations for Other Services       Precautions / Restrictions Precautions Precautions: Fall Restrictions Weight Bearing Restrictions: No      Mobility  Bed Mobility Overal bed mobility: Needs Assistance Bed Mobility: Supine to Sit;Sit to Supine     Supine to sit: Mod assist Sit to supine: Mod assist   General bed mobility comments: Mod A for assist with LEs and trunk elevation. Required mod A for LE assist to return to supine   Transfers Overall transfer level: Needs assistance Equipment used: None Transfers: Sit to/from Stand Sit to Stand: Max assist         General transfer comment: PT stood in front of pt to assist with standing. Required max A for lift assist and steadying to stand.   Ambulation/Gait             General Gait Details: Unsafe to attempt with +1 assist   Stairs            Wheelchair Mobility    Modified Rankin (Stroke Patients Only)       Balance Overall balance assessment: Needs assistance Sitting-balance support: No upper extremity supported;Feet supported Sitting balance-Leahy Scale: Fair     Standing balance support: Bilateral upper extremity  supported;During functional activity Standing balance-Leahy Scale: Poor Standing balance comment: Reliant on BUE support and external support.                              Pertinent Vitals/Pain Pain Assessment: No/denies pain    Home Living Family/patient expects to be discharged to:: Assisted living               Home Equipment: Walker - 2 wheels;Shower seat;Bedside commode;Wheelchair - manual      Prior Function Level of Independence: Needs assistance   Gait / Transfers Assistance Needed: Reports she required assist for short distance ambulation with RW. Otherwise required use of WC.   ADL's / Homemaking Assistance Needed: Required assist for ADLs.         Hand Dominance        Extremity/Trunk Assessment   Upper Extremity Assessment Upper Extremity Assessment: Defer to OT evaluation    Lower Extremity Assessment Lower Extremity Assessment: Generalized weakness    Cervical / Trunk Assessment Cervical / Trunk Assessment: Kyphotic  Communication   Communication: No difficulties  Cognition Arousal/Alertness: Awake/alert Behavior During Therapy: WFL for tasks assessed/performed Overall Cognitive Status: Within Functional Limits for tasks assessed                                        General  Comments General comments (skin integrity, edema, etc.): Pt voiced concerns about returning to current ALF as she feels she has just gotten weaker there.     Exercises     Assessment/Plan    PT Assessment Patient needs continued PT services  PT Problem List Decreased strength;Decreased balance;Decreased mobility;Decreased knowledge of use of DME       PT Treatment Interventions DME instruction;Gait training;Functional mobility training;Therapeutic activities;Therapeutic exercise;Balance training;Patient/family education    PT Goals (Current goals can be found in the Care Plan section)  Acute Rehab PT Goals Patient Stated Goal: to be  able to walk  PT Goal Formulation: With patient Time For Goal Achievement: 08/05/17 Potential to Achieve Goals: Fair    Frequency Min 2X/week   Barriers to discharge        Co-evaluation               AM-PAC PT "6 Clicks" Daily Activity  Outcome Measure Difficulty turning over in bed (including adjusting bedclothes, sheets and blankets)?: Unable Difficulty moving from lying on back to sitting on the side of the bed? : Unable Difficulty sitting down on and standing up from a chair with arms (e.g., wheelchair, bedside commode, etc,.)?: Unable Help needed moving to and from a bed to chair (including a wheelchair)?: Total Help needed walking in hospital room?: Total Help needed climbing 3-5 steps with a railing? : Total 6 Click Score: 6    End of Session Equipment Utilized During Treatment: Gait belt Activity Tolerance: Patient tolerated treatment well Patient left: in bed;with call bell/phone within reach;with family/visitor present Nurse Communication: Mobility status PT Visit Diagnosis: Unsteadiness on feet (R26.81);Muscle weakness (generalized) (M62.81);Difficulty in walking, not elsewhere classified (R26.2)    Time: 1202-1219 PT Time Calculation (min) (ACUTE ONLY): 17 min   Charges:   PT Evaluation $PT Eval Moderate Complexity: 1 Mod     PT G Codes:        Leighton Ruff, PT, DPT  Acute Rehabilitation Services  Pager: [phone]   Rudean Hitt 07/22/2017, 3:17 PM

## 2017-07-22 NOTE — Progress Notes (Addendum)
Subjective: Feeling well this morning. She was able to eat a little bit last night and this morning. She continues to complain that food "just does not taste good." She continues to have some cough, congestion, and malaise this morning. She also endorses feeling a bit more depressed recently and having poor concentration.  Objective: Vital signs in last 24 hours: Vitals:   07/22/17 1200 07/22/17 1356 07/22/17 1400 07/22/17 1603  BP: (!) 147/65 (!) 147/65 125/64 (!) 132/56  Pulse: 68 74 70 72  Resp: $Remo'15 20 18 18  'swyPq$ Temp:    98.6 F (37 C)  TempSrc:    Oral  SpO2: 93% 92% 91% 93%  Weight:      Height:       Weight change:   Intake/Output Summary (Last 24 hours) at 07/22/2017 1622 Last data filed at 07/22/2017 0400 Gross per 24 hour  Intake 80 ml  Output -  Net 80 ml   General appearance: alert, appears stated age, no distress and watching TV Head: Normocephalic, without obvious abnormality, atraumatic Lungs: course crackles heard in all lung fields. Bases > apices Heart: regular rate and rhythm, S1, S2 normal and systolic murmur: systolic ejection 3/6, crescendo, decrescendo and blowing throughout the precordium, loudest at RUSB consistent with prior aortic valve replacement Abdomen: soft, non-tender; bowel sounds normal; no masses,  no organomegaly Extremities: extremities normal, atraumatic, no cyanosis or edema Neurologic: Grossly normal Lab Results: $RemoveBefo'@LABTEST2'jQyhUOwsqLy$ @ Micro Results: Recent Results (from the past 240 hour(s))  Blood Culture (routine x 2)     Status: None (Preliminary result)   Collection Time: 07/21/17  8:15 PM  Result Value Ref Range Status   Specimen Description BLOOD BLOOD LEFT FOREARM  Final   Special Requests   Final    BOTTLES DRAWN AEROBIC AND ANAEROBIC Blood Culture adequate volume   Culture   Final    NO GROWTH < 24 HOURS Performed at [facility], [address], [place], [place] [address]    Report Status PENDING  Incomplete  Blood Culture  (routine x 2)     Status: None (Preliminary result)   Collection Time: 07/21/17  8:30 PM  Result Value Ref Range Status   Specimen Description BLOOD RIGHT WRIST  Final   Special Requests   Final    BOTTLES DRAWN AEROBIC AND ANAEROBIC Blood Culture results may not be optimal due to an inadequate volume of blood received in culture bottles   Culture PENDING  Incomplete   Report Status PENDING  Incomplete  Respiratory Panel by PCR     Status: Abnormal   Collection Time: 07/22/17  1:43 AM  Result Value Ref Range Status   Adenovirus NOT DETECTED NOT DETECTED Final   Coronavirus 229E NOT DETECTED NOT DETECTED Final   Coronavirus HKU1 NOT DETECTED NOT DETECTED Final   Coronavirus NL63 NOT DETECTED NOT DETECTED Final   Coronavirus OC43 NOT DETECTED NOT DETECTED Final   Metapneumovirus NOT DETECTED NOT DETECTED Final   Rhinovirus / Enterovirus NOT DETECTED NOT DETECTED Final   Influenza A NOT DETECTED NOT DETECTED Final   Influenza B NOT DETECTED NOT DETECTED Final   Parainfluenza Virus 1 NOT DETECTED NOT DETECTED Final   Parainfluenza Virus 2 NOT DETECTED NOT DETECTED Final   Parainfluenza Virus 3 DETECTED (A) NOT DETECTED Final   Parainfluenza Virus 4 NOT DETECTED NOT DETECTED Final   Respiratory Syncytial Virus NOT DETECTED NOT DETECTED Final   Bordetella pertussis NOT DETECTED NOT DETECTED Final   Chlamydophila pneumoniae NOT  DETECTED NOT DETECTED Final   Mycoplasma pneumoniae NOT DETECTED NOT DETECTED Final   Studies/Results: Dg Chest 2 View  Result Date: 07/21/2017 CLINICAL DATA:  Acute onset of productive cough. EXAM: CHEST - 2 VIEW COMPARISON:  Chest radiograph performed 06/22/2017 FINDINGS: The lungs are well-aerated and clear. There is no evidence of focal opacification, pleural effusion or pneumothorax. The heart is borderline normal in size; the patient is status post median sternotomy. An aortic valve replacement is noted. No acute osseous abnormalities are seen. Clips are noted  at the left axilla. Clips are noted within the right upper quadrant, reflecting prior cholecystectomy. Mild chronic compression deformity is noted at the lower thoracic spine. IMPRESSION: No acute cardiopulmonary process seen. Electronically Signed   By: Garald Balding M.D.   On: 07/21/2017 21:40   Medications: I have reviewed the patient's current medications. Scheduled Meds: . amLODipine  10 mg Oral Daily  . aspirin EC  81 mg Oral Daily  . atorvastatin  80 mg Oral q1800  . [START ON 07/23/2017] azithromycin  250 mg Oral Daily  . enoxaparin (LOVENOX) injection  40 mg Subcutaneous Q24H  . hydrALAZINE  50 mg Oral BID  . insulin aspart  0-7 Units Subcutaneous TID WC  . loratadine  10 mg Oral Daily  . metoprolol tartrate  25 mg Oral Daily  . PARoxetine  20 mg Oral Daily  . polyethylene glycol  17 g Oral Daily   Continuous Infusions: PRN Meds:.acetaminophen **OR** acetaminophen, guaiFENesin, naphazoline-glycerin Assessment/Plan: Active Problems:   Hypoglycemia  Hypoglycemia Likely 2/2 continued insulin use in the setting of poor PO intake. Hypoglycemia in the setting of hypothermia, cough, and chills are certainly concerning for infection, but her RVP showed parainfluenza virus, her CXR was normal, and she has no signs of infection elsewhere. She takes Levemir 24 units QHS at the ALF. Will re-start at half of her usual dose. - Levemir 12 units QHS - POC glucose checks q2h - Give D10 if she becomes hypoglycemi  CKD3 BL Cr approximately 1.3 to 1.5. 1.59 on presentation. Does not technically meet the 0.3 mg/dL Cr bump of AKI criteria. Improvement to 1.37 with IVF suggests that AKI was secondary to poor PO intake. - Trend BMP  Upper respiratory tract infection Code Sepsis was called on admission due to hypoglycemia, hypothermia, and lactic acidosis. Received 1 dose of Vanc and Zosyn. De-escalated to azithromycin for atypical coverage due to CXR showing no consolidation. RVP positive for  parainfluenzavirus. Azithromycin now discontinued. - Guaifenesin 100 mg q4h PRN - Resume antibiotics if clinically worsens  Depression Anorexia, poor concentration, and feeling more depressed are concerning for worsening depression. - Paxil 20 mg QD - Consider increasing dose of Paxil if appetite does not improve  Hypothermia (resolved) Most likely 2/2 hypoglycemia. Rectal temp on presentation was 95.1. Has since improved to 98.6 with Bair hugger and correction of hypoglycemia.  HFpEF On Lasix 20 mg QD and metoprolol tartrate 25 mg QD. - Hold home Lasix given poor PO intake - Continue metoprolol tartrate 25 mg QD - Consider switching to longer-acting metoprolol succinate prior to discharge or at outpatient follow-up  HTN - Continue home amlodipine 10 mg QD, hydralazine 50 mg BID, and metoprolol 25 mg QD - Consider switching to longer-acting metoprolol succinate prior to discharge or at outpatient follow-up  Normocytic normochromic anemia Hgb 10.1. MCV 96. MCHC 32. No prior iron studies found on chart review. - F/u Fe, TIBC, Ferretin  FEN:CM Diet,  VTE ppx: Lovenox Code Status:  FULL Dispo: Currently admitted on tele. Expected stay >2 midnights. Possible discharge 5/30 or 5/31  This is a Careers information officer Note.  The care of the patient was discussed with Dr. Rebeca Alert and the assessment and plan formulated with their assistance.  Please see their attached note for official documentation of the daily encounter.   LOS: 0 days   Doristine Johns, Medical Student 07/22/2017, 4:22 PM

## 2017-07-23 ENCOUNTER — Telehealth: Payer: Self-pay | Admitting: Internal Medicine

## 2017-07-23 DIAGNOSIS — F329 Major depressive disorder, single episode, unspecified: Secondary | ICD-10-CM | POA: Diagnosis not present

## 2017-07-23 DIAGNOSIS — E162 Hypoglycemia, unspecified: Secondary | ICD-10-CM | POA: Diagnosis not present

## 2017-07-23 DIAGNOSIS — I5030 Unspecified diastolic (congestive) heart failure: Secondary | ICD-10-CM | POA: Diagnosis not present

## 2017-07-23 DIAGNOSIS — J9690 Respiratory failure, unspecified, unspecified whether with hypoxia or hypercapnia: Secondary | ICD-10-CM | POA: Diagnosis not present

## 2017-07-23 DIAGNOSIS — N183 Chronic kidney disease, stage 3 (moderate): Secondary | ICD-10-CM | POA: Diagnosis not present

## 2017-07-23 DIAGNOSIS — R279 Unspecified lack of coordination: Secondary | ICD-10-CM | POA: Diagnosis not present

## 2017-07-23 DIAGNOSIS — Z743 Need for continuous supervision: Secondary | ICD-10-CM | POA: Diagnosis not present

## 2017-07-23 DIAGNOSIS — I130 Hypertensive heart and chronic kidney disease with heart failure and stage 1 through stage 4 chronic kidney disease, or unspecified chronic kidney disease: Secondary | ICD-10-CM | POA: Diagnosis not present

## 2017-07-23 DIAGNOSIS — D649 Anemia, unspecified: Secondary | ICD-10-CM | POA: Diagnosis not present

## 2017-07-23 LAB — GLUCOSE, CAPILLARY
Glucose-Capillary: 96 mg/dL (ref 65–99)
Glucose-Capillary: 158 mg/dL — ABNORMAL HIGH (ref 65–99)
Glucose-Capillary: 188 mg/dL — ABNORMAL HIGH (ref 65–99)
Glucose-Capillary: 201 mg/dL — ABNORMAL HIGH (ref 65–99)

## 2017-07-23 LAB — BASIC METABOLIC PANEL
Anion gap: 8 (ref 5–15)
BUN: 14 mg/dL (ref 6–20)
CO2: 26 mmol/L (ref 22–32)
Calcium: 8.8 mg/dL — ABNORMAL LOW (ref 8.9–10.3)
Chloride: 106 mmol/L (ref 101–111)
Creatinine, Ser: 1.33 mg/dL — ABNORMAL HIGH (ref 0.44–1.00)
GFR calc Af Amer: 44 mL/min — ABNORMAL LOW (ref >60)
GFR calc non Af Amer: 38 mL/min — ABNORMAL LOW (ref >60)
Glucose, Bld: 104 mg/dL — ABNORMAL HIGH (ref 65–99)
Potassium: 4.3 mmol/L (ref 3.5–5.1)
Sodium: 140 mmol/L (ref 135–145)

## 2017-07-23 LAB — IRON AND TIBC
Iron: 58 ug/dL (ref 28–170)
Saturation Ratios: 28 % (ref 10.4–31.8)
TIBC: 204 ug/dL — ABNORMAL LOW (ref 250–450)
UIBC: 146 ug/dL

## 2017-07-23 LAB — CBC
HCT: 34.2 % — ABNORMAL LOW (ref 36.0–46.0)
Hemoglobin: 10.9 g/dL — ABNORMAL LOW (ref 12.0–15.0)
MCH: 30.8 pg (ref 26.0–34.0)
MCHC: 31.9 g/dL (ref 30.0–36.0)
MCV: 96.6 fL (ref 78.0–100.0)
Platelets: 227 10*3/uL (ref 150–400)
RBC: 3.54 MIL/uL — ABNORMAL LOW (ref 3.87–5.11)
RDW: 13.8 % (ref 11.5–15.5)
WBC: 6.9 10*3/uL (ref 4.0–10.5)

## 2017-07-23 LAB — MRSA PCR SCREENING: MRSA by PCR: NEGATIVE

## 2017-07-23 LAB — FERRITIN: Ferritin: 80 ng/mL (ref 11–307)

## 2017-07-23 MED ORDER — GUAIFENESIN 100 MG/5ML PO SOLN: 5.0000 mL | ORAL | 0 refills | Status: DC | PRN

## 2017-07-23 MED ORDER — INSULIN DETEMIR 100 UNIT/ML FLEXPEN: 12.0000 [IU] | PEN_INJECTOR | Freq: Every day | SUBCUTANEOUS | 3 refills | Status: DC

## 2017-07-23 MED ORDER — PAROXETINE HCL 30 MG PO TABS: 30.0000 mg | ORAL_TABLET | Freq: Every day | ORAL | 2 refills | Status: DC

## 2017-07-23 NOTE — Care Management Note (Signed)
Case Management Note  Patient Details  Name: [name] MRN: [id number] Date of Birth: 04/09/1945  Subjective/Objective:   72 yr old female admitted with hypoglycemic event from her ALF.                  Action/Plan: Patient will return to her ALF. Therapy will be arranged .    Expected Discharge Date:  07/23/17               Expected Discharge Plan:  ([place])  In-House Referral:  Clinical Social Work  Discharge planning Services  CM Consult  Post Acute Care Choice:  Home Health Choice offered to:     DME Arranged:  N/A DME Agency:  NA  HH Arranged:  PT, OT HH Agency:     Status of Service:  In process, will continue to follow  If discussed at Long Length of Stay Meetings, dates discussed:    Additional Comments:  Ninfa Meeker, RN 07/23/2017, 3:21 PM

## 2017-07-23 NOTE — Discharge Summary (Addendum)
Name: [name] MRN: [id number] DOB: April 18, 1945 72 y.o. PCP: [name], MD  Date of Admission: 07/21/2017  7:52 PM Date of Discharge: 07/23/17 Attending Physician: Oda Kilts, MD  Discharge Diagnosis: Principal Problem:   Hypoglycemia Active Problems:   Diabetes mellitus with renal manifestations, controlled ([place])   Depression   CKD (chronic kidney disease) stage 2, GFR 60-89 ml/min   Chronic diastolic CHF (congestive heart failure) ([facility])   AKI (acute kidney injury) ([place])   Hypothermia   Discharge Medications: Allergies as of 07/23/2017      Reactions   Other Other (See Comments)   NO "blood products," as the patient is a Jehovah's Witness      Medication List    STOP taking these medications   insulin aspart 100 UNIT/ML FlexPen Commonly known as:  NOVOLOG FLEXPEN   metoprolol tartrate 25 MG tablet Commonly known as:  LOPRESSOR     TAKE these medications   ACCU-CHEK FASTCLIX LANCETS Misc Use to check blood sugar up to 3 times a day   acetaminophen 325 MG tablet Commonly known as:  TYLENOL Take 650 mg by mouth 4 (four) times daily as needed for mild pain or headache.   amLODipine 10 MG tablet Commonly known as:  NORVASC Take 1 tablet (10 mg total) by mouth daily.   aspirin 81 MG EC tablet TAKE 1 TABLET (81 MG TOTAL) BY MOUTH DAILY. SWALLOW WHOLE.   atorvastatin 80 MG tablet Commonly known as:  LIPITOR Take 1 tablet (80 mg total) by mouth daily at 6 PM. What changed:  when to take this   docusate sodium 100 MG capsule Commonly known as:  COLACE Take 200 mg by mouth daily as needed for mild constipation.   eucerin lotion Apply topically 2 (two) times daily.   furosemide 40 MG tablet Commonly known as:  LASIX Take 1 tablet (40 mg total) by mouth 2 (two) times daily. What changed:  Another medication with the same name was removed. Continue taking this medication, and follow the directions you see here.   glucose blood test  strip Commonly known as:  ACCU-CHEK GUIDE Check blood sugar 3 times a day as instructed   guaiFENesin 100 MG/5ML Soln Commonly known as:  ROBITUSSIN Take 5 mLs (100 mg total) by mouth every 4 (four) hours as needed for cough or to loosen phlegm.   hydrALAZINE 50 MG tablet Commonly known as:  APRESOLINE Take 1 tablet (50 mg total) by mouth 2 (two) times daily.   Insulin Detemir 100 UNIT/ML Pen Commonly known as:  LEVEMIR FLEXTOUCH Inject 12 Units into the skin at bedtime. Do not give insulin for blood sugar less then 150. What changed:    how much to take  additional instructions   Loratadine 10 MG Caps Take 1 capsule (10 mg total) by mouth daily. What changed:  Another medication with the same name was removed. Continue taking this medication, and follow the directions you see here.   metFORMIN 1000 MG tablet Commonly known as:  GLUCOPHAGE Take 1 tablet (1,000 mg total) by mouth 2 (two) times daily with a meal.   metoprolol succinate 25 MG 24 hr tablet Commonly known as:  TOPROL-XL Take 1 tablet (25 mg total) by mouth daily.   PARoxetine 30 MG tablet Commonly known as:  PAXIL Take 1 tablet (30 mg total) by mouth daily. What changed:    medication strength  how much to take   Pen Needles 3/16" 31G X 5 MM  Misc 1 each by Does not apply route 4 (four) times daily. Use to check blood sugars 4 times a day. Dx code E11.22   polyethylene glycol packet Commonly known as:  MIRALAX / GLYCOLAX Take 17 g by mouth daily.   tetrahydrozoline 0.05 % ophthalmic solution Place 1 drop into both eyes as needed (dry eyes).       Disposition and follow-up:   [name] was discharged from [facility] in Stable condition.  At the hospital follow up visit please address:  1. Short-acting insulin was held as an inpatient due to hypoglycemia. She is on 24 U Levemir at home. Discharging on 12 units QHS. She will likely need to resume a short-acting prandial  insulin at her outpatient follow-up.   2. Paxil dose was increased from 20 to 30 given her worsening depression the past few weeks and recent anorexia and weight loss. Consider dose adjustment as outpatient.  3. Normocytic normochromic anemia with normal iron stores. Likely requires additional workup as an outpatient.  4.  Labs / imaging needed at time of follow-up: Weight was 80.7 kg on admission. Re-check at outpatient follow-up given recent anorexia and poor PO intake.  5.  Pending labs/ test needing follow-up: None  Follow-up Appointments: Follow-up Information    [name], MD. Go on 07/28/2017.   Specialty:  Internal Medicine Why:  At 10:45 AM Contact information: [place] [address] [phone]        Dorothy Spark, MD .   Specialty:  Cardiology Contact information: [place] [address] [phone]           Hospital Course by problem list: Principal Problem:   Hypoglycemia Active Problems:   Diabetes mellitus with renal manifestations, controlled ([place])   Depression   CKD (chronic kidney disease) stage 2, GFR 60-89 ml/min   Chronic diastolic CHF (congestive heart failure) ([facility])   AKI (acute kidney injury) ([place])   Hypothermia   Hypoglycemia Likely 2/2 continued insulin use in the setting of poor PO intake. She had been taking Levemir 24 U QHS and NovoLog 10 U TID with meals. All insulin held on admission. Am glucose the next morning was 104. After discussion with Dr. Lynnae January, NovoLog was not resumed on discharge. Levemir resumed at 12 U QHS on night prior to discharge.  CKD3 BL Cr approximately 1.3 to 1.5. 1.59 on presentation. Does not technically meet the 0.3 mg/dL Cr bump of AKI criteria. Improvement to 1.37 with IVF suggests that AKI was secondary to poor PO intake.  Upper respiratory tract infection Code Sepsis was called on admission due to hypoglycemia, hypothermia, and lactic  acidosis. Received 1 dose of Vanc and Zosyn. De-escalated to azithromycin for atypical coverage due to CXR showing no consolidation. RVP positive for parainfluenzavirus. Azithromycin now discontinued. Pulm exam showed diffuse ronchi in bases > apices improved prior to discharge.  Depression Anorexia, poor concentration, and feeling more depressed are concerning for worsening depression. After discussion with Dr. Lynnae January, Paxil 20 mg QD increased to 30 mg QD prior to discharge.  Hypothermia (resolved) Most likely 2/2 hypoglycemia. Rectal temp on presentation was 95.1. Improved to 98.6 with Bair hugger and correction of hypoglycemia. Remained within normal limits throughout the remainder of the hospitalization.  HFpEF On Lasix 20 mg QD and metoprolol succinate 25 mg QD at home. Lasix was held while inpatient due to poor PO intake and lactate of 2.6, resumed on discharge.  HTN Home meds continued while inpatient.  Normocytic normochromic anemia Hgb 10.1. MCV 96. MCHC 32. Fe 58. TIBC 204. Ferritin 80.  Discharge Vitals:   BP (!) 173/78 (BP Location: Right Arm)   Pulse 72   Temp 98.6 F (37 C)   Resp 18   Ht $R'5\' 7"'Uy$  (1.702 m)   Wt 80.7 kg (178 lb)   SpO2 95%   BMI 27.88 kg/m   Pertinent Labs, Studies, and Procedures:  Iron/TIBC/Ferritin/ %Sat    Component Value Date/Time   IRON 58 07/23/2017 0757   TIBC 204 (L) 07/23/2017 0757   FERRITIN 80 07/23/2017 0757   IRONPCTSAT 28 07/23/2017 0757   CBC    Component Value Date/Time   WBC 6.9 07/23/2017 0757   RBC 3.54 (L) 07/23/2017 0757   HGB 10.9 (L) 07/23/2017 0757   HCT 34.2 (L) 07/23/2017 0757   PLT 227 07/23/2017 0757   MCV 96.6 07/23/2017 0757   MCH 30.8 07/23/2017 0757   MCHC 31.9 07/23/2017 0757   RDW 13.8 07/23/2017 0757   LYMPHSABS 0.9 07/21/2017 2059   MONOABS 0.6 07/21/2017 2059   EOSABS 0.1 07/21/2017 2059   BASOSABS 0.0 07/21/2017 2059   Lactic Acid, Venous    Component Value Date/Time   LATICACIDVEN 1.92  (H) 07/21/2017 2334   CMP Latest Ref Rng & Units 07/23/2017 07/22/2017 07/21/2017  Glucose 65 - 99 mg/dL 104(H) 173(H) 167(H)  BUN 6 - 20 mg/dL $Remove'14 17 19  'XocydIT$ Creatinine 0.44 - 1.00 mg/dL 1.33(H) 1.37(H) 1.59(H)  Sodium 135 - 145 mmol/L 140 140 137  Potassium 3.5 - 5.1 mmol/L 4.3 4.4 4.5  Chloride 101 - 111 mmol/L 106 104 98(L)  CO2 22 - 32 mmol/L $RemoveB'26 27 28  'LsmETOjp$ Calcium 8.9 - 10.3 mg/dL 8.8(L) 8.5(L) 9.7  Total Protein 6.5 - 8.1 g/dL - - 8.0  Total Bilirubin 0.3 - 1.2 mg/dL - - 0.5  Alkaline Phos 38 - 126 U/L - - 98  AST 15 - 41 U/L - - 17  ALT 14 - 54 U/L - - 10(L)    Discharge Instructions: Discharge Instructions    Diet - low sodium heart healthy   Complete by:  As directed    Diet - low sodium heart healthy   Complete by:  As directed    Discharge instructions   Complete by:  As directed    It was pleasure taking care of you. We are making some changes to your medicine after discussing with Dr. Lynnae January We had decreasing the amount of Levemir, nausea will take 12 units at bedtime instead of 24.  Please keep checking your blood sugar 4 times a day and bring that log to your follow-up appointment in the clinic. You will not take any NovoLog as you are not eating good at this time NovoLog can be restarted during your follow-up appointment. We had also increasing the dose of Paxil to 30 mg daily, see if that will help with your depression. Please follow-up in clinic on Tuesday, July 28, 2017 at 10:45 AM.   Increase activity slowly   Complete by:  As directed    Increase activity slowly   Complete by:  As directed       Signed: Doristine Johns, Medical Student 07/23/2017, 4:12 PM   Pager: [phone]  Attestation for Student Documentation:  I personally was present and performed or re-performed the history, physical exam and medical decision-making activities of this service and have verified that the service and findings are accurately  documented in the student's note.  Lorella Nimrod, MD 07/23/2017, 4:34 PM

## 2017-07-23 NOTE — Clinical Social Work Note (Signed)
Clinical Social Work Assessment  Patient Details  Name: [name] MRN: [id number] Date of Birth: 1945/10/16  Date of referral:  07/16/17               Reason for consult:  Discharge Planning                Permission sought to share information with:  Family Supports Permission granted to share information::  No(Daughter was at the bedside and patient allowed her to particiate in the conversation)  Name::     [name]  Agency::     Relationship::  Daughter  Contact Information:  [phone]  Housing/Transportation Living arrangements for the past 2 months:  Assisted Living Facility([facility]) Source of Information:  Patient Patient Interpreter Needed:  None Criminal Activity/Legal Involvement Pertinent to Current Situation/Hospitalization:  No - Comment as needed Significant Relationships:  Adult Children Lives with:  Facility Resident([facility]) Do you feel safe going back to the place where you live?  Yes Need for family participation in patient care:  Yes (Comment)  Care giving concerns:  No concerns expressed by patient or daughter regarding patient's care at ALF.  Social Worker assessment / plan:  CSW talked with patient and daughter [name] at the bedside regarding d/c disposition. [name] was sitting up in a chair, greeted CSW warmly and was pleasant and engaged easily in conversation. Patient is from ALF and has been there a few weeks per patient and daughter. CSW informed that patient discharged from hospital a while ago, went to [facility] for rehab and then to the ALF. Patient reported that she did have her own apartment before going to ALF.  Employment status:  Retired Forensic scientist:  Medicare PT Recommendations:  Skilled Nursing Facility(Patient is Observation and Medicare and will return to ALF) Information / Referral to community resources:  Other (Comment Required)(None needed or requested as patient is  Medicare and OBS and will be returning to ALF)  Patient/Family's Response to care:  No concerns expressed by patient or daughter regarding care during hospitalization.  Patient/Family's Understanding of and Emotional Response to Diagnosis, Current Treatment, and Prognosis: Patient and daughter advised of today's discharge and they reported being aware of possible d/c today.   Emotional Assessment Appearance:  Appears stated age Attitude/Demeanor/Rapport:  Engaged Affect (typically observed):  Appropriate, Pleasant Orientation:  Oriented to Self, Oriented to Situation, Oriented to Place, Oriented to  Time Alcohol / Substance use:    Psych involvement (Current and /or in the community):  No (Comment)  Discharge Needs  Concerns to be addressed:  Discharge Planning Concerns Readmission within the last 30 days:  Yes Current discharge risk:  None Barriers to Discharge:  No Barriers Identified   Audrey Feil, LCSW 07/23/2017, 3:31 PM

## 2017-07-23 NOTE — Progress Notes (Addendum)
Report called to [facility]; IV removed, telebox returned, family requested for [facility] to transport patient, transport called

## 2017-07-23 NOTE — Evaluation (Signed)
Occupational Therapy Evaluation Patient Details Name: [name] MRN: [id number] DOB: [birth year] Today's Date: 07/23/2017    History of Present Illness Pt is a 72 y/o female admitted secondary to hypoglycemia. Chest imaging negative. PMH includes CKD 3, CAD s/p CABG, HTN, DM, PAD, CVA, and s/p AVR.   Clinical Impression   Pt was assisted for ADL and reports not having walked at her ALF but minimally. Presents with generalized weakness, decrease activity tolerance and poor standing balance. Pt requires set up to total assist for ADL currently. She will benefit from rehab in a SNF prior to return to her ALF. Will follow acutely.    Follow Up Recommendations  SNF;Supervision/Assistance - 24 hour    Equipment Recommendations       Recommendations for Other Services       Precautions / Restrictions Precautions Precautions: Fall Precaution Comments: incontinent      Mobility Bed Mobility Overal bed mobility: Needs Assistance Bed Mobility: Supine to Sit     Supine to sit: Min assist     General bed mobility comments: HOB up increased time, very little help  Transfers Overall transfer level: Needs assistance Equipment used: Rolling walker (2 wheeled) Transfers: Sit to/from [facility] Sit to Stand: Mod assist Stand pivot transfers: Min assist       General transfer comment: assist to stand from elevated bed, posterior bias    Balance Overall balance assessment: Needs assistance   Sitting balance-Leahy Scale: Fair       Standing balance-Leahy Scale: Poor Standing balance comment: Reliant on BUE support and external support.                            ADL either performed or assessed with clinical judgement   ADL Overall ADL's : Needs assistance/impaired Eating/Feeding: Set up;Bed level   Grooming: Set up;Sitting   Upper Body Bathing: Minimal assistance;Sitting   Lower Body Bathing: Total assistance;Sit to/from stand    Upper Body Dressing : Minimal assistance;Sitting   Lower Body Dressing: Total assistance;Sit to/from stand   Toilet Transfer: Minimal assistance;Stand-pivot;RW   Toileting- Clothing Manipulation and Hygiene: Total assistance;Sit to/from stand               Vision Patient Visual Report: No change from baseline       Perception     Praxis      Pertinent Vitals/Pain Pain Assessment: No/denies pain     Hand Dominance Right   Extremity/Trunk Assessment Upper Extremity Assessment Upper Extremity Assessment: Generalized weakness   Lower Extremity Assessment Lower Extremity Assessment: Defer to PT evaluation   Cervical / Trunk Assessment Cervical / Trunk Assessment: Kyphotic   Communication Communication Communication: No difficulties(low volume)   Cognition Arousal/Alertness: Awake/alert Behavior During Therapy: WFL for tasks assessed/performed Overall Cognitive Status: Within Functional Limits for tasks assessed                                 General Comments: depressed demeanor   General Comments       Exercises     Shoulder Instructions      Home Living Family/patient expects to be discharged to:: Skilled nursing facility                             Home Equipment: Gilford Rile - 2 wheels;Shower seat;Bedside commode;Wheelchair - manual  Prior Functioning/Environment Level of Independence: Needs assistance  Gait / Transfers Assistance Needed: Reports she required assist for short distance ambulation with RW. Otherwise required use of WC.  ADL's / Homemaking Assistance Needed: Required assist for ADLs.    Comments: pt cannot give details about PLOF        OT Problem List: Decreased strength      OT Treatment/Interventions:      OT Goals(Current goals can be found in the care plan section) Acute Rehab OT Goals Patient Stated Goal: to be able to walk  OT Goal Formulation: With patient Time For Goal Achievement:  08/06/17 Potential to Achieve Goals: Good ADL Goals Pt Will Perform Grooming: with min assist;standing(one activity) Pt Will Perform Upper Body Bathing: with supervision;sitting;with set-up Pt Will Perform Upper Body Dressing: with supervision;sitting Pt Will Transfer to Toilet: with min assist;ambulating;bedside commode Pt Will Perform Toileting - Clothing Manipulation and hygiene: with mod assist;sit to/from stand Pt/caregiver will Perform Home Exercise Program: Both right and left upper extremity;With theraband;With Supervision Additional ADL Goal #1: Pt will perform bed mobility with min guard assist.  OT Frequency:     Barriers to D/C:            Co-evaluation              AM-PAC PT "6 Clicks" Daily Activity     Outcome Measure Help from another person eating meals?: None Help from another person taking care of personal grooming?: A Little Help from another person toileting, which includes using toliet, bedpan, or urinal?: Total Help from another person bathing (including washing, rinsing, drying)?: A Lot Help from another person to put on and taking off regular upper body clothing?: A Little Help from another person to put on and taking off regular lower body clothing?: Total 6 Click Score: 14   End of Session Equipment Utilized During Treatment: Gait belt;Rolling walker Nurse Communication: Mobility status  Activity Tolerance: Patient tolerated treatment well Patient left: in chair;with call bell/phone within reach;with nursing/sitter in room  OT Visit Diagnosis: Other abnormalities of gait and mobility (R26.89);Muscle weakness (generalized) (M62.81);Unsteadiness on feet (R26.81)                Time: 1188-6773 OT Time Calculation (min): 32 min Charges:  OT General Charges $OT Visit: 1 Visit OT Evaluation $OT Eval Moderate Complexity: 1 Mod OT Treatments $Self Care/Home Management : 8-22 mins G-Codes:     2017-07-29 Nestor Lewandowsky, OTR/L Pager:  [phone]  Shaylon Gillean, Haze Boyden 2017/07/29, 10:08 AM

## 2017-07-23 NOTE — Telephone Encounter (Signed)
Hospital f/u per Dr Reesa Chew; pt appt [phone] 06/04/NW

## 2017-07-23 NOTE — Progress Notes (Signed)
Subjective: Feeling better this morning. She reports some appetite last night and was able to eat dinner. No appetite this morning, although she reports she wouldn't usually be hungry around this hour. Cough is improving.  Objective: Vital signs in last 24 hours: Vitals:   07/22/17 1603 07/22/17 1807 07/22/17 2104 07/23/17 0513  BP: (!) 132/56 (!) 149/63 133/64 (!) 152/70  Pulse: 72 72 67 63  Resp: $Remo'18 18 16 16  'wbUgy$ Temp: 98.6 F (37 C) 98.3 F (36.8 C) 98.3 F (36.8 C) 98 F (36.7 C)  TempSrc: Oral Oral Oral Oral  SpO2: 93% 96% 94% 93%  Weight:      Height:       Weight change:   Intake/Output Summary (Last 24 hours) at 07/23/2017 0742 Last data filed at 07/23/2017 0923 Gross per 24 hour  Intake 120 ml  Output 550 ml  Net -430 ml   General appearance: alert, appears stated age, no distress, sleeping Head: Normocephalic, without obvious abnormality, atraumatic Lungs: course crackles and expiratory wheezes heard in all lung fields. Bases > apices. Crackles improved compared to yesterday. Heart: regular rate and rhythm, S1, S2 normal and systolic murmur: systolic ejection 3/6, crescendo, decrescendo and blowing throughout the precordium, loudest at RUSB consistent with prior aortic valve replacement Abdomen: soft, non-tender; bowel sounds normal; no masses,  no organomegaly Extremities: extremities normal, atraumatic, no cyanosis or edema Neurologic: Grossly normal Psych: Affect is sad and has a constricted range  Lab Results:  Micro Results: Recent Results (from the past 240 hour(s))  Blood Culture (routine x 2)     Status: None (Preliminary result)   Collection Time: 07/21/17  8:15 PM  Result Value Ref Range Status   Specimen Description BLOOD BLOOD LEFT FOREARM  Final   Special Requests   Final    BOTTLES DRAWN AEROBIC AND ANAEROBIC Blood Culture adequate volume   Culture   Final    NO GROWTH < 24 HOURS Performed at [facility], [address], [place],  [place] [address]    Report Status PENDING  Incomplete  Blood Culture (routine x 2)     Status: None (Preliminary result)   Collection Time: 07/21/17  8:30 PM  Result Value Ref Range Status   Specimen Description BLOOD RIGHT WRIST  Final   Special Requests   Final    BOTTLES DRAWN AEROBIC AND ANAEROBIC Blood Culture results may not be optimal due to an inadequate volume of blood received in culture bottles   Culture PENDING  Incomplete   Report Status PENDING  Incomplete  Respiratory Panel by PCR     Status: Abnormal   Collection Time: 07/22/17  1:43 AM  Result Value Ref Range Status   Adenovirus NOT DETECTED NOT DETECTED Final   Coronavirus 229E NOT DETECTED NOT DETECTED Final   Coronavirus HKU1 NOT DETECTED NOT DETECTED Final   Coronavirus NL63 NOT DETECTED NOT DETECTED Final   Coronavirus OC43 NOT DETECTED NOT DETECTED Final   Metapneumovirus NOT DETECTED NOT DETECTED Final   Rhinovirus / Enterovirus NOT DETECTED NOT DETECTED Final   Influenza A NOT DETECTED NOT DETECTED Final   Influenza B NOT DETECTED NOT DETECTED Final   Parainfluenza Virus 1 NOT DETECTED NOT DETECTED Final   Parainfluenza Virus 2 NOT DETECTED NOT DETECTED Final   Parainfluenza Virus 3 DETECTED (A) NOT DETECTED Final   Parainfluenza Virus 4 NOT DETECTED NOT DETECTED Final   Respiratory Syncytial Virus NOT DETECTED NOT DETECTED Final   Bordetella pertussis NOT DETECTED  NOT DETECTED Final   Chlamydophila pneumoniae NOT DETECTED NOT DETECTED Final   Mycoplasma pneumoniae NOT DETECTED NOT DETECTED Final   Studies/Results: Dg Chest 2 View  Result Date: 07/21/2017 CLINICAL DATA:  Acute onset of productive cough. EXAM: CHEST - 2 VIEW COMPARISON:  Chest radiograph performed 06/22/2017 FINDINGS: The lungs are well-aerated and clear. There is no evidence of focal opacification, pleural effusion or pneumothorax. The heart is borderline normal in size; the patient is status post median sternotomy. An aortic valve replacement is  noted. No acute osseous abnormalities are seen. Clips are noted at the left axilla. Clips are noted within the right upper quadrant, reflecting prior cholecystectomy. Mild chronic compression deformity is noted at the lower thoracic spine. IMPRESSION: No acute cardiopulmonary process seen. Electronically Signed   By: Garald Balding M.D.   On: 07/21/2017 21:40   Medications: I have reviewed the patient's current medications. Scheduled Meds: . amLODipine  10 mg Oral Daily  . aspirin EC  81 mg Oral Daily  . atorvastatin  80 mg Oral q1800  . enoxaparin (LOVENOX) injection  40 mg Subcutaneous Q24H  . hydrALAZINE  50 mg Oral BID  . insulin aspart  0-7 Units Subcutaneous TID WC  . insulin detemir  12 Units Subcutaneous QHS  . loratadine  10 mg Oral Daily  . metoprolol tartrate  25 mg Oral Daily  . PARoxetine  20 mg Oral Daily  . polyethylene glycol  17 g Oral Daily   Continuous Infusions: PRN Meds:.acetaminophen **OR** acetaminophen, guaiFENesin, naphazoline-glycerin Assessment/Plan: Principal Problem:   Hypoglycemia Active Problems:   Diabetes mellitus with renal manifestations, controlled (HCC)   Depression   CKD (chronic kidney disease) stage 2, GFR 60-89 ml/min   Chronic diastolic CHF (congestive heart failure) (HCC)   AKI (acute kidney injury) ([place])   Hypothermia  Hypoglycemia Likely 2/2 continued insulin use in the setting of poor PO intake. Hypoglycemia in the setting of hypothermia, cough, and chills are certainly concerning for infection, but her RVP showed parainfluenza virus, her CXR was normal, and she has no signs of infection elsewhere. She takes Levemir 24 units QHS at the ALF. Levemir resumed at half her original dose last night. Given the improvement in her blood glucose, will resume home dose of Levemir today. - Levemir 24 units QHS - POC glucose checks q2h - Give D10 if she becomes hypoglycemic  CKD3 BL Cr approximately 1.3 to 1.5. 1.59 on presentation. Does not  technically meet the 0.3 mg/dL Cr bump of AKI criteria. Improvement to 1.37 with IVF suggests that AKI was secondary to poor PO intake.  Upper respiratory tract infection Code Sepsis was called on admission due to hypoglycemia, hypothermia, and lactic acidosis. Received 1 dose of Vanc and Zosyn. De-escalated to azithromycin for atypical coverage due to CXR showing no consolidation. RVP positive for parainfluenzavirus. Azithromycin now discontinued. Respiratory exam improving. - Guaifenesin 100 mg q4h PRN - Resume antibiotics if clinically worsens  Depression Anorexia, poor concentration, and feeling more depressed are concerning for worsening depression. - Paxil 20 mg QD - Consider increasing dose of Paxil if appetite does not improve  Hypothermia (resolved) Most likely 2/2 hypoglycemia. Rectal temp on presentation was 95.1. Improved to 98.6 with Bair hugger and correction of hypoglycemia. Remained within normal limits overnight.  HFpEF On Lasix 20 mg QD and metoprolol tartrate 25 mg QD. - Hold home Lasix given poor PO intake - Continue metoprolol tartrate 25 mg QD - Consider switching to longer-acting metoprolol succinate prior to discharge  or at outpatient follow-up  HTN - Continue home amlodipine 10 mg QD, hydralazine 50 mg BID, and metoprolol 25 mg QD - Consider switching to longer-acting metoprolol succinate prior to discharge or at outpatient follow-up  Normocytic normochromic anemia Hgb 10.1. MCV 96. MCHC 32. No prior iron studies found on chart review. - F/u Fe, TIBC, Ferretin  FEN:CM Diet,  VTE ppx: Lovenox Code Status: FULL Dispo: Currently admitted on tele. Expected stay >2 midnights. Possible discharge 5/30 or 5/31  This is a Careers information officer Note.  The care of the patient was discussed with Dr. Rebeca Alert and the assessment and plan formulated with their assistance.  Please see their attached note for official documentation of the daily encounter.   LOS: 0 days   Doristine Johns, Medical Student 07/23/2017, 7:42 AM  Attestation for Student Documentation:  I personally was present and performed or re-performed the history, physical exam and medical decision-making activities of this service and have verified that the service and findings are accurately documented in the student's note.  Lorella Nimrod, MD 07/23/2017, 8:21 PM

## 2017-07-23 NOTE — Clinical Social Work Note (Signed)
Patient medically stable for discharge back to [facility] today. Facility Resident Care Coordinator, Kenney Houseman contacted [phone]) and advised. Discharge clinicals transmitted to facility. Daughter [name] at the bedside and is aware of discharge. CSW signing off as no other SW intervention services needed.  Raushanah Osmundson Givens, MSW, LCSW Licensed Clinical Social Worker [place] [phone]

## 2017-07-23 NOTE — NC FL2 (Signed)
[place] LEVEL OF CARE SCREENING TOOL     IDENTIFICATION  Patient Name: [name] Birthdate: May 30, 1945 Sex: female Admission Date (Current Location): 07/21/2017  [facility] and [place] Number:  Herbalist and Address:  The [place], [place] [address] [place], [place] [address]      Provider Number: [id number]  Attending Physician Name and Address:  Oda Kilts, MD  Relative Name and Phone Number:  [name] Jackson-daughter-[phone]; [name] Armstron-daughter-[phone]    Current Level of Care: Hospital Recommended Level of Care: Assisted Living Facility(From [facility]F) Prior Approval Number:    Date Approved/Denied:   PASRR Number:    Discharge Plan: Other (Comment)([facility] ALF)    Current Diagnoses: Patient Active Problem List   Diagnosis Date Noted  . AKI (acute kidney injury) ([place]) 07/22/2017  . Hypothermia 07/22/2017  . Hypoglycemia 07/21/2017  . Chronic diastolic CHF (congestive heart failure) ([place]) 06/22/2017  . Peripheral arterial disease ([place]) 06/18/2017  . B12 deficiency 05/01/2017  . Bilateral lower extremity edema 11/08/2015  . Aortic atherosclerosis ([place]) 06/07/2014  . Abnormality of gait 05/29/2012  . HOCM (hypertrophic obstructive cardiomyopathy) ([place]) 06/11/2011  . CKD (chronic kidney disease) stage 2, GFR 60-89 ml/min 06/03/2011  . Routine health maintenance 06/10/2010  . Hyperlipidemia   . Refusal of blood transfusions as patient is Jehovah's Witness   . History of adenocarcinoma of breast   . Osteoporosis   . Status post CVA   . Aortic stenosis s/p Tissure AVR 2006   . CAD (coronary artery disease)   . Hypertension   . Depression 01/23/2006  . Diabetes mellitus with renal manifestations, controlled ([place]) 03/25/1990    Orientation RESPIRATION BLADDER Height & Weight     Self, Time, Situation, Place  Normal Incontinent, External catheter(Catheter  placed 5/28) Weight: 178 lb (80.7 kg) Height:  $Remove'5\' 7"'wNirytQ$  (170.2 cm)  BEHAVIORAL SYMPTOMS/MOOD NEUROLOGICAL BOWEL NUTRITION STATUS      Continent No concentrated sweets  AMBULATORY STATUS COMMUNICATION OF NEEDS Skin   Extensive Assist(Plus 1 assist per PT) Verbally Normal                       Personal Care Assistance Level of Assistance  Bathing, Feeding, Dressing Bathing Assistance: Maximum assistance(Upper body min assist and lower body mod assit) Feeding assistance: Independent Dressing Assistance: Maximum assistance(Upper body min assist and lower body mod assist)     Functional Limitations Info  Sight, Hearing, Speech Sight Info: Adequate Hearing Info: Adequate Speech Info: Adequate    SPECIAL CARE FACTORS FREQUENCY  PT (By licensed PT), OT (By licensed OT)     PT Frequency: Evaluated 5/29  OT Frequency: Evaluated 5/30            Contractures Contractures Info: Not present    Additional Factors Info  Code Status Code Status Info: Full             Current Medications (07/23/2017):  This is the current hospital active medication list Current Facility-Administered Medications  Medication Dose Route Frequency Provider Last Rate Last Dose  . acetaminophen (TYLENOL) tablet 650 mg  650 mg Oral Q6H PRN Alphonzo Grieve, MD   650 mg at 07/22/17 1123   Or  . acetaminophen (TYLENOL) suppository 650 mg  650 mg Rectal Q6H PRN Alphonzo Grieve, MD      . amLODipine (NORVASC) tablet 10 mg  10 mg Oral Daily Alphonzo Grieve, MD   10 mg at  07/23/17 1007  . aspirin EC tablet 81 mg  81 mg Oral Daily Alphonzo Grieve, MD   81 mg at 07/23/17 1007  . atorvastatin (LIPITOR) tablet 80 mg  80 mg Oral q1800 Alphonzo Grieve, MD   80 mg at 07/22/17 2105  . enoxaparin (LOVENOX) injection 40 mg  40 mg Subcutaneous Q24H Alphonzo Grieve, MD   40 mg at 07/22/17 2105  . guaiFENesin (ROBITUSSIN) 100 MG/5ML solution 100 mg  5 mL Oral Q4H PRN Alphonzo Grieve, MD      . hydrALAZINE (APRESOLINE)  tablet 50 mg  50 mg Oral BID Alphonzo Grieve, MD   50 mg at 07/23/17 1007  . insulin aspart (novoLOG) injection 0-7 Units  0-7 Units Subcutaneous TID WC Alphonzo Grieve, MD   1 Units at 07/23/17 1303  . insulin detemir (LEVEMIR) injection 12 Units  12 Units Subcutaneous QHS Jule Ser, DO   12 Units at 07/22/17 2104  . loratadine (CLARITIN) tablet 10 mg  10 mg Oral Daily Alphonzo Grieve, MD   10 mg at 07/23/17 1006  . metoprolol tartrate (LOPRESSOR) tablet 25 mg  25 mg Oral Daily Alphonzo Grieve, MD   25 mg at 07/23/17 1007  . naphazoline-glycerin (CLEAR EYES REDNESS) ophth solution 1-2 drop  1-2 drop Both Eyes QID PRN Alphonzo Grieve, MD      . PARoxetine (PAXIL) tablet 20 mg  20 mg Oral Daily Alphonzo Grieve, MD   20 mg at 07/23/17 1006  . polyethylene glycol (MIRALAX / GLYCOLAX) packet 17 g  17 g Oral Daily Alphonzo Grieve, MD   17 g at 07/23/17 1006     Discharge Medications: Please see discharge summary for a list of discharge medications.  Relevant Imaging Results:  Relevant Lab Results:   Additional Information DISCHARGE MEDICATIONS: Medication List    STOP taking these medications   insulin aspart 100 UNIT/ML FlexPen Commonly known as:  NOVOLOG FLEXPEN   metoprolol tartrate 25 MG tablet Commonly known as:  LOPRESSOR     TAKE these medications   ACCU-CHEK FASTCLIX LANCETS Misc Use to check blood sugar up to 3 times a day   acetaminophen 325 MG tablet Commonly known as:  TYLENOL Take 650 mg by mouth 4 (four) times daily as needed for mild pain or headache.   amLODipine 10 MG tablet Commonly known as:  NORVASC Take 1 tablet (10 mg total) by mouth daily.   aspirin 81 MG EC tablet TAKE 1 TABLET (81 MG TOTAL) BY MOUTH DAILY. SWALLOW WHOLE.   atorvastatin 80 MG tablet Commonly known as:  LIPITOR Take 1 tablet (80 mg total) by mouth daily at 6 PM. What changed:  when to take this   docusate sodium 100 MG capsule Commonly known as:  COLACE Take 200 mg  by mouth daily as needed for mild constipation.   eucerin lotion Apply topically 2 (two) times daily.   furosemide 40 MG tablet Commonly known as:  LASIX Take 1 tablet (40 mg total) by mouth 2 (two) times daily. What changed:  Another medication with the same name was removed. Continue taking this medication, and follow the directions you see here.   glucose blood test strip Commonly known as:  ACCU-CHEK GUIDE Check blood sugar 3 times a day as instructed   guaiFENesin 100 MG/5ML Soln Commonly known as:  ROBITUSSIN Take 5 mLs (100 mg total) by mouth every 4 (four) hours as needed for cough or to loosen phlegm.   hydrALAZINE 50 MG tablet Commonly known as:  APRESOLINE Take 1 tablet (50 mg total) by mouth 2 (two) times daily.   Insulin Detemir 100 UNIT/ML Pen Commonly known as:  LEVEMIR FLEXTOUCH Inject 12 Units into the skin at bedtime. Do not give insulin for blood sugar less then 150. What changed:    how much to take  additional instructions   Loratadine 10 MG Caps Take 1 capsule (10 mg total) by mouth daily. What changed:  Another medication with the same name was removed. Continue taking this medication, and follow the directions you see here.   metFORMIN 1000 MG tablet Commonly known as:  GLUCOPHAGE Take 1 tablet (1,000 mg total) by mouth 2 (two) times daily with a meal.   metoprolol succinate 25 MG 24 hr tablet Commonly known as:  TOPROL-XL Take 1 tablet (25 mg total) by mouth daily.   PARoxetine 30 MG tablet Commonly known as:  PAXIL Take 1 tablet (30 mg total) by mouth daily. What changed:    medication strength  how much to take   Pen Needles 3/16" 31G X 5 MM Misc 1 each by Does not apply route 4 (four) times daily. Use to check blood sugars 4 times a day. Dx code E11.22   polyethylene glycol packet Commonly known as:  MIRALAX / GLYCOLAX Take 17 g by mouth daily.   tetrahydrozoline 0.05 % ophthalmic solution Place 1 drop into both eyes  as needed (dry eyes).         Sable Feil, LCSW

## 2017-07-24 DIAGNOSIS — R32 Unspecified urinary incontinence: Secondary | ICD-10-CM | POA: Diagnosis not present

## 2017-07-24 DIAGNOSIS — H04129 Dry eye syndrome of unspecified lacrimal gland: Secondary | ICD-10-CM | POA: Diagnosis not present

## 2017-07-24 DIAGNOSIS — I350 Nonrheumatic aortic (valve) stenosis: Secondary | ICD-10-CM | POA: Diagnosis not present

## 2017-07-24 DIAGNOSIS — E785 Hyperlipidemia, unspecified: Secondary | ICD-10-CM | POA: Diagnosis not present

## 2017-07-24 DIAGNOSIS — I509 Heart failure, unspecified: Secondary | ICD-10-CM | POA: Diagnosis not present

## 2017-07-24 DIAGNOSIS — E1122 Type 2 diabetes mellitus with diabetic chronic kidney disease: Secondary | ICD-10-CM | POA: Diagnosis not present

## 2017-07-24 DIAGNOSIS — R2689 Other abnormalities of gait and mobility: Secondary | ICD-10-CM | POA: Diagnosis not present

## 2017-07-24 NOTE — Telephone Encounter (Signed)
Pt discharged from hospital on 5/30  back to [facility] (assisted living). Attempted to contact pt at number on chart [phone] answer.  Message left on recorder for return call regarding recent hospitalization.Despina Hidden Cassady5/31/201911:32 AM

## 2017-07-25 DIAGNOSIS — I350 Nonrheumatic aortic (valve) stenosis: Secondary | ICD-10-CM | POA: Diagnosis not present

## 2017-07-25 DIAGNOSIS — E1122 Type 2 diabetes mellitus with diabetic chronic kidney disease: Secondary | ICD-10-CM | POA: Diagnosis not present

## 2017-07-25 DIAGNOSIS — E785 Hyperlipidemia, unspecified: Secondary | ICD-10-CM | POA: Diagnosis not present

## 2017-07-25 DIAGNOSIS — R2689 Other abnormalities of gait and mobility: Secondary | ICD-10-CM | POA: Diagnosis not present

## 2017-07-25 DIAGNOSIS — I509 Heart failure, unspecified: Secondary | ICD-10-CM | POA: Diagnosis not present

## 2017-07-25 DIAGNOSIS — H04129 Dry eye syndrome of unspecified lacrimal gland: Secondary | ICD-10-CM | POA: Diagnosis not present

## 2017-07-26 LAB — CULTURE, BLOOD (ROUTINE X 2)
Culture: NO GROWTH
Culture: NO GROWTH
Special Requests: ADEQUATE

## 2017-07-28 ENCOUNTER — Encounter: Payer: Self-pay | Admitting: Internal Medicine

## 2017-07-28 ENCOUNTER — Ambulatory Visit: Payer: Medicare Other

## 2017-07-28 DIAGNOSIS — I350 Nonrheumatic aortic (valve) stenosis: Secondary | ICD-10-CM | POA: Diagnosis not present

## 2017-07-28 DIAGNOSIS — E1122 Type 2 diabetes mellitus with diabetic chronic kidney disease: Secondary | ICD-10-CM | POA: Diagnosis not present

## 2017-07-28 DIAGNOSIS — R2689 Other abnormalities of gait and mobility: Secondary | ICD-10-CM | POA: Diagnosis not present

## 2017-07-28 DIAGNOSIS — H04129 Dry eye syndrome of unspecified lacrimal gland: Secondary | ICD-10-CM | POA: Diagnosis not present

## 2017-07-28 DIAGNOSIS — I509 Heart failure, unspecified: Secondary | ICD-10-CM | POA: Diagnosis not present

## 2017-07-28 DIAGNOSIS — E785 Hyperlipidemia, unspecified: Secondary | ICD-10-CM | POA: Diagnosis not present

## 2017-07-28 NOTE — Telephone Encounter (Signed)
Patient no showed appt for HFU today. Hubbard Hartshorn, RN, BSN

## 2017-07-29 DIAGNOSIS — I129 Hypertensive chronic kidney disease with stage 1 through stage 4 chronic kidney disease, or unspecified chronic kidney disease: Secondary | ICD-10-CM | POA: Diagnosis not present

## 2017-07-29 DIAGNOSIS — I10 Essential (primary) hypertension: Secondary | ICD-10-CM | POA: Diagnosis not present

## 2017-07-29 DIAGNOSIS — I5189 Other ill-defined heart diseases: Secondary | ICD-10-CM | POA: Diagnosis not present

## 2017-07-29 DIAGNOSIS — N184 Chronic kidney disease, stage 4 (severe): Secondary | ICD-10-CM | POA: Diagnosis not present

## 2017-07-31 DIAGNOSIS — E1122 Type 2 diabetes mellitus with diabetic chronic kidney disease: Secondary | ICD-10-CM | POA: Diagnosis not present

## 2017-07-31 DIAGNOSIS — H04129 Dry eye syndrome of unspecified lacrimal gland: Secondary | ICD-10-CM | POA: Diagnosis not present

## 2017-07-31 DIAGNOSIS — R2689 Other abnormalities of gait and mobility: Secondary | ICD-10-CM | POA: Diagnosis not present

## 2017-07-31 DIAGNOSIS — I509 Heart failure, unspecified: Secondary | ICD-10-CM | POA: Diagnosis not present

## 2017-07-31 DIAGNOSIS — I350 Nonrheumatic aortic (valve) stenosis: Secondary | ICD-10-CM | POA: Diagnosis not present

## 2017-07-31 DIAGNOSIS — E785 Hyperlipidemia, unspecified: Secondary | ICD-10-CM | POA: Diagnosis not present

## 2017-08-03 DIAGNOSIS — E1122 Type 2 diabetes mellitus with diabetic chronic kidney disease: Secondary | ICD-10-CM | POA: Diagnosis not present

## 2017-08-03 DIAGNOSIS — R5381 Other malaise: Secondary | ICD-10-CM | POA: Diagnosis not present

## 2017-08-03 DIAGNOSIS — E119 Type 2 diabetes mellitus without complications: Secondary | ICD-10-CM | POA: Diagnosis not present

## 2017-08-03 DIAGNOSIS — E785 Hyperlipidemia, unspecified: Secondary | ICD-10-CM | POA: Diagnosis not present

## 2017-08-03 DIAGNOSIS — H04129 Dry eye syndrome of unspecified lacrimal gland: Secondary | ICD-10-CM | POA: Diagnosis not present

## 2017-08-03 DIAGNOSIS — I350 Nonrheumatic aortic (valve) stenosis: Secondary | ICD-10-CM | POA: Diagnosis not present

## 2017-08-03 DIAGNOSIS — I509 Heart failure, unspecified: Secondary | ICD-10-CM | POA: Diagnosis not present

## 2017-08-03 DIAGNOSIS — R2689 Other abnormalities of gait and mobility: Secondary | ICD-10-CM | POA: Diagnosis not present

## 2017-08-03 DIAGNOSIS — I10 Essential (primary) hypertension: Secondary | ICD-10-CM | POA: Diagnosis not present

## 2017-08-04 DIAGNOSIS — E785 Hyperlipidemia, unspecified: Secondary | ICD-10-CM | POA: Diagnosis not present

## 2017-08-04 DIAGNOSIS — R2689 Other abnormalities of gait and mobility: Secondary | ICD-10-CM | POA: Diagnosis not present

## 2017-08-04 DIAGNOSIS — E1122 Type 2 diabetes mellitus with diabetic chronic kidney disease: Secondary | ICD-10-CM | POA: Diagnosis not present

## 2017-08-04 DIAGNOSIS — I509 Heart failure, unspecified: Secondary | ICD-10-CM | POA: Diagnosis not present

## 2017-08-04 DIAGNOSIS — I350 Nonrheumatic aortic (valve) stenosis: Secondary | ICD-10-CM | POA: Diagnosis not present

## 2017-08-04 DIAGNOSIS — H04129 Dry eye syndrome of unspecified lacrimal gland: Secondary | ICD-10-CM | POA: Diagnosis not present

## 2017-08-05 DIAGNOSIS — E1122 Type 2 diabetes mellitus with diabetic chronic kidney disease: Secondary | ICD-10-CM | POA: Diagnosis not present

## 2017-08-05 DIAGNOSIS — I350 Nonrheumatic aortic (valve) stenosis: Secondary | ICD-10-CM | POA: Diagnosis not present

## 2017-08-05 DIAGNOSIS — I509 Heart failure, unspecified: Secondary | ICD-10-CM | POA: Diagnosis not present

## 2017-08-05 DIAGNOSIS — E785 Hyperlipidemia, unspecified: Secondary | ICD-10-CM | POA: Diagnosis not present

## 2017-08-05 DIAGNOSIS — R2689 Other abnormalities of gait and mobility: Secondary | ICD-10-CM | POA: Diagnosis not present

## 2017-08-05 DIAGNOSIS — H04129 Dry eye syndrome of unspecified lacrimal gland: Secondary | ICD-10-CM | POA: Diagnosis not present

## 2017-08-07 DIAGNOSIS — R2689 Other abnormalities of gait and mobility: Secondary | ICD-10-CM | POA: Diagnosis not present

## 2017-08-07 DIAGNOSIS — I350 Nonrheumatic aortic (valve) stenosis: Secondary | ICD-10-CM | POA: Diagnosis not present

## 2017-08-07 DIAGNOSIS — E1122 Type 2 diabetes mellitus with diabetic chronic kidney disease: Secondary | ICD-10-CM | POA: Diagnosis not present

## 2017-08-07 DIAGNOSIS — H04129 Dry eye syndrome of unspecified lacrimal gland: Secondary | ICD-10-CM | POA: Diagnosis not present

## 2017-08-07 DIAGNOSIS — E785 Hyperlipidemia, unspecified: Secondary | ICD-10-CM | POA: Diagnosis not present

## 2017-08-07 DIAGNOSIS — I509 Heart failure, unspecified: Secondary | ICD-10-CM | POA: Diagnosis not present

## 2017-08-11 ENCOUNTER — Ambulatory Visit: Payer: Medicare Other | Admitting: Physician Assistant

## 2017-08-11 DIAGNOSIS — E785 Hyperlipidemia, unspecified: Secondary | ICD-10-CM | POA: Diagnosis not present

## 2017-08-11 DIAGNOSIS — I509 Heart failure, unspecified: Secondary | ICD-10-CM | POA: Diagnosis not present

## 2017-08-11 DIAGNOSIS — H04129 Dry eye syndrome of unspecified lacrimal gland: Secondary | ICD-10-CM | POA: Diagnosis not present

## 2017-08-11 DIAGNOSIS — E1122 Type 2 diabetes mellitus with diabetic chronic kidney disease: Secondary | ICD-10-CM | POA: Diagnosis not present

## 2017-08-11 DIAGNOSIS — R2689 Other abnormalities of gait and mobility: Secondary | ICD-10-CM | POA: Diagnosis not present

## 2017-08-11 DIAGNOSIS — I350 Nonrheumatic aortic (valve) stenosis: Secondary | ICD-10-CM | POA: Diagnosis not present

## 2017-08-12 DIAGNOSIS — R5381 Other malaise: Secondary | ICD-10-CM | POA: Diagnosis not present

## 2017-08-12 DIAGNOSIS — H04129 Dry eye syndrome of unspecified lacrimal gland: Secondary | ICD-10-CM | POA: Diagnosis not present

## 2017-08-12 DIAGNOSIS — I509 Heart failure, unspecified: Secondary | ICD-10-CM | POA: Diagnosis not present

## 2017-08-12 DIAGNOSIS — R05 Cough: Secondary | ICD-10-CM | POA: Diagnosis not present

## 2017-08-12 DIAGNOSIS — E1122 Type 2 diabetes mellitus with diabetic chronic kidney disease: Secondary | ICD-10-CM | POA: Diagnosis not present

## 2017-08-12 DIAGNOSIS — R2689 Other abnormalities of gait and mobility: Secondary | ICD-10-CM | POA: Diagnosis not present

## 2017-08-12 DIAGNOSIS — I350 Nonrheumatic aortic (valve) stenosis: Secondary | ICD-10-CM | POA: Diagnosis not present

## 2017-08-12 DIAGNOSIS — E785 Hyperlipidemia, unspecified: Secondary | ICD-10-CM | POA: Diagnosis not present

## 2017-08-13 DIAGNOSIS — R05 Cough: Secondary | ICD-10-CM | POA: Diagnosis not present

## 2017-08-13 DIAGNOSIS — I350 Nonrheumatic aortic (valve) stenosis: Secondary | ICD-10-CM | POA: Diagnosis not present

## 2017-08-13 DIAGNOSIS — E785 Hyperlipidemia, unspecified: Secondary | ICD-10-CM | POA: Diagnosis not present

## 2017-08-13 DIAGNOSIS — E1122 Type 2 diabetes mellitus with diabetic chronic kidney disease: Secondary | ICD-10-CM | POA: Diagnosis not present

## 2017-08-13 DIAGNOSIS — I509 Heart failure, unspecified: Secondary | ICD-10-CM | POA: Diagnosis not present

## 2017-08-13 DIAGNOSIS — R2689 Other abnormalities of gait and mobility: Secondary | ICD-10-CM | POA: Diagnosis not present

## 2017-08-13 DIAGNOSIS — H04129 Dry eye syndrome of unspecified lacrimal gland: Secondary | ICD-10-CM | POA: Diagnosis not present

## 2017-08-15 DIAGNOSIS — R2689 Other abnormalities of gait and mobility: Secondary | ICD-10-CM | POA: Diagnosis not present

## 2017-08-15 DIAGNOSIS — E785 Hyperlipidemia, unspecified: Secondary | ICD-10-CM | POA: Diagnosis not present

## 2017-08-15 DIAGNOSIS — E1122 Type 2 diabetes mellitus with diabetic chronic kidney disease: Secondary | ICD-10-CM | POA: Diagnosis not present

## 2017-08-15 DIAGNOSIS — I350 Nonrheumatic aortic (valve) stenosis: Secondary | ICD-10-CM | POA: Diagnosis not present

## 2017-08-15 DIAGNOSIS — I509 Heart failure, unspecified: Secondary | ICD-10-CM | POA: Diagnosis not present

## 2017-08-15 DIAGNOSIS — H04129 Dry eye syndrome of unspecified lacrimal gland: Secondary | ICD-10-CM | POA: Diagnosis not present

## 2017-08-17 DIAGNOSIS — H04129 Dry eye syndrome of unspecified lacrimal gland: Secondary | ICD-10-CM | POA: Diagnosis not present

## 2017-08-17 DIAGNOSIS — E1122 Type 2 diabetes mellitus with diabetic chronic kidney disease: Secondary | ICD-10-CM | POA: Diagnosis not present

## 2017-08-17 DIAGNOSIS — I509 Heart failure, unspecified: Secondary | ICD-10-CM | POA: Diagnosis not present

## 2017-08-17 DIAGNOSIS — E785 Hyperlipidemia, unspecified: Secondary | ICD-10-CM | POA: Diagnosis not present

## 2017-08-17 DIAGNOSIS — R2689 Other abnormalities of gait and mobility: Secondary | ICD-10-CM | POA: Diagnosis not present

## 2017-08-17 DIAGNOSIS — I350 Nonrheumatic aortic (valve) stenosis: Secondary | ICD-10-CM | POA: Diagnosis not present

## 2017-08-19 DIAGNOSIS — H04129 Dry eye syndrome of unspecified lacrimal gland: Secondary | ICD-10-CM | POA: Diagnosis not present

## 2017-08-19 DIAGNOSIS — E785 Hyperlipidemia, unspecified: Secondary | ICD-10-CM | POA: Diagnosis not present

## 2017-08-19 DIAGNOSIS — I509 Heart failure, unspecified: Secondary | ICD-10-CM | POA: Diagnosis not present

## 2017-08-19 DIAGNOSIS — E1122 Type 2 diabetes mellitus with diabetic chronic kidney disease: Secondary | ICD-10-CM | POA: Diagnosis not present

## 2017-08-19 DIAGNOSIS — R05 Cough: Secondary | ICD-10-CM | POA: Diagnosis not present

## 2017-08-19 DIAGNOSIS — I350 Nonrheumatic aortic (valve) stenosis: Secondary | ICD-10-CM | POA: Diagnosis not present

## 2017-08-19 DIAGNOSIS — R2689 Other abnormalities of gait and mobility: Secondary | ICD-10-CM | POA: Diagnosis not present

## 2017-08-21 DIAGNOSIS — I509 Heart failure, unspecified: Secondary | ICD-10-CM | POA: Diagnosis not present

## 2017-08-21 DIAGNOSIS — H04129 Dry eye syndrome of unspecified lacrimal gland: Secondary | ICD-10-CM | POA: Diagnosis not present

## 2017-08-21 DIAGNOSIS — R2689 Other abnormalities of gait and mobility: Secondary | ICD-10-CM | POA: Diagnosis not present

## 2017-08-21 DIAGNOSIS — E785 Hyperlipidemia, unspecified: Secondary | ICD-10-CM | POA: Diagnosis not present

## 2017-08-21 DIAGNOSIS — I350 Nonrheumatic aortic (valve) stenosis: Secondary | ICD-10-CM | POA: Diagnosis not present

## 2017-08-21 DIAGNOSIS — E1122 Type 2 diabetes mellitus with diabetic chronic kidney disease: Secondary | ICD-10-CM | POA: Diagnosis not present

## 2017-08-25 DIAGNOSIS — I509 Heart failure, unspecified: Secondary | ICD-10-CM | POA: Diagnosis not present

## 2017-08-25 DIAGNOSIS — E785 Hyperlipidemia, unspecified: Secondary | ICD-10-CM | POA: Diagnosis not present

## 2017-08-25 DIAGNOSIS — E1122 Type 2 diabetes mellitus with diabetic chronic kidney disease: Secondary | ICD-10-CM | POA: Diagnosis not present

## 2017-08-25 DIAGNOSIS — R2689 Other abnormalities of gait and mobility: Secondary | ICD-10-CM | POA: Diagnosis not present

## 2017-08-25 DIAGNOSIS — I350 Nonrheumatic aortic (valve) stenosis: Secondary | ICD-10-CM | POA: Diagnosis not present

## 2017-08-25 DIAGNOSIS — H04129 Dry eye syndrome of unspecified lacrimal gland: Secondary | ICD-10-CM | POA: Diagnosis not present

## 2017-08-26 DIAGNOSIS — E1122 Type 2 diabetes mellitus with diabetic chronic kidney disease: Secondary | ICD-10-CM | POA: Diagnosis not present

## 2017-08-26 DIAGNOSIS — E785 Hyperlipidemia, unspecified: Secondary | ICD-10-CM | POA: Diagnosis not present

## 2017-08-26 DIAGNOSIS — I350 Nonrheumatic aortic (valve) stenosis: Secondary | ICD-10-CM | POA: Diagnosis not present

## 2017-08-26 DIAGNOSIS — I509 Heart failure, unspecified: Secondary | ICD-10-CM | POA: Diagnosis not present

## 2017-08-26 DIAGNOSIS — H04129 Dry eye syndrome of unspecified lacrimal gland: Secondary | ICD-10-CM | POA: Diagnosis not present

## 2017-08-26 DIAGNOSIS — R2689 Other abnormalities of gait and mobility: Secondary | ICD-10-CM | POA: Diagnosis not present

## 2017-08-28 DIAGNOSIS — R2689 Other abnormalities of gait and mobility: Secondary | ICD-10-CM | POA: Diagnosis not present

## 2017-08-28 DIAGNOSIS — I509 Heart failure, unspecified: Secondary | ICD-10-CM | POA: Diagnosis not present

## 2017-08-28 DIAGNOSIS — E1122 Type 2 diabetes mellitus with diabetic chronic kidney disease: Secondary | ICD-10-CM | POA: Diagnosis not present

## 2017-08-28 DIAGNOSIS — E785 Hyperlipidemia, unspecified: Secondary | ICD-10-CM | POA: Diagnosis not present

## 2017-08-28 DIAGNOSIS — H04129 Dry eye syndrome of unspecified lacrimal gland: Secondary | ICD-10-CM | POA: Diagnosis not present

## 2017-08-28 DIAGNOSIS — I350 Nonrheumatic aortic (valve) stenosis: Secondary | ICD-10-CM | POA: Diagnosis not present

## 2017-09-01 DIAGNOSIS — R2689 Other abnormalities of gait and mobility: Secondary | ICD-10-CM | POA: Diagnosis not present

## 2017-09-01 DIAGNOSIS — E785 Hyperlipidemia, unspecified: Secondary | ICD-10-CM | POA: Diagnosis not present

## 2017-09-01 DIAGNOSIS — I509 Heart failure, unspecified: Secondary | ICD-10-CM | POA: Diagnosis not present

## 2017-09-01 DIAGNOSIS — H04129 Dry eye syndrome of unspecified lacrimal gland: Secondary | ICD-10-CM | POA: Diagnosis not present

## 2017-09-01 DIAGNOSIS — E875 Hyperkalemia: Secondary | ICD-10-CM | POA: Diagnosis not present

## 2017-09-01 DIAGNOSIS — I350 Nonrheumatic aortic (valve) stenosis: Secondary | ICD-10-CM | POA: Diagnosis not present

## 2017-09-01 DIAGNOSIS — E1122 Type 2 diabetes mellitus with diabetic chronic kidney disease: Secondary | ICD-10-CM | POA: Diagnosis not present

## 2017-09-04 DIAGNOSIS — E785 Hyperlipidemia, unspecified: Secondary | ICD-10-CM | POA: Diagnosis not present

## 2017-09-04 DIAGNOSIS — R2689 Other abnormalities of gait and mobility: Secondary | ICD-10-CM | POA: Diagnosis not present

## 2017-09-04 DIAGNOSIS — I350 Nonrheumatic aortic (valve) stenosis: Secondary | ICD-10-CM | POA: Diagnosis not present

## 2017-09-04 DIAGNOSIS — E1122 Type 2 diabetes mellitus with diabetic chronic kidney disease: Secondary | ICD-10-CM | POA: Diagnosis not present

## 2017-09-04 DIAGNOSIS — I509 Heart failure, unspecified: Secondary | ICD-10-CM | POA: Diagnosis not present

## 2017-09-04 DIAGNOSIS — H04129 Dry eye syndrome of unspecified lacrimal gland: Secondary | ICD-10-CM | POA: Diagnosis not present

## 2017-09-05 ENCOUNTER — Encounter (HOSPITAL_COMMUNITY): Payer: Self-pay | Admitting: Emergency Medicine

## 2017-09-05 ENCOUNTER — Other Ambulatory Visit: Payer: Self-pay

## 2017-09-05 ENCOUNTER — Emergency Department (HOSPITAL_COMMUNITY)
Admission: EM | Admit: 2017-09-05 | Discharge: 2017-09-05 | Disposition: A | Discharge status: Home / Self Care | Payer: Medicare Other | Attending: Emergency Medicine | Admitting: Emergency Medicine

## 2017-09-05 ENCOUNTER — Emergency Department (HOSPITAL_COMMUNITY): Payer: Medicare Other

## 2017-09-05 DIAGNOSIS — W19XXXA Unspecified fall, initial encounter: Secondary | ICD-10-CM

## 2017-09-05 DIAGNOSIS — Z7982 Long term (current) use of aspirin: Secondary | ICD-10-CM | POA: Insufficient documentation

## 2017-09-05 DIAGNOSIS — Y92009 Unspecified place in unspecified non-institutional (private) residence as the place of occurrence of the external cause: Secondary | ICD-10-CM | POA: Diagnosis not present

## 2017-09-05 DIAGNOSIS — E785 Hyperlipidemia, unspecified: Secondary | ICD-10-CM | POA: Diagnosis not present

## 2017-09-05 DIAGNOSIS — N182 Chronic kidney disease, stage 2 (mild): Secondary | ICD-10-CM | POA: Diagnosis not present

## 2017-09-05 DIAGNOSIS — S300XXA Contusion of lower back and pelvis, initial encounter: Secondary | ICD-10-CM | POA: Diagnosis not present

## 2017-09-05 DIAGNOSIS — Z794 Long term (current) use of insulin: Secondary | ICD-10-CM | POA: Insufficient documentation

## 2017-09-05 DIAGNOSIS — Y998 Other external cause status: Secondary | ICD-10-CM | POA: Diagnosis not present

## 2017-09-05 DIAGNOSIS — M5489 Other dorsalgia: Secondary | ICD-10-CM | POA: Diagnosis not present

## 2017-09-05 DIAGNOSIS — W010XXA Fall on same level from slipping, tripping and stumbling without subsequent striking against object, initial encounter: Secondary | ICD-10-CM | POA: Insufficient documentation

## 2017-09-05 DIAGNOSIS — E119 Type 2 diabetes mellitus without complications: Secondary | ICD-10-CM | POA: Insufficient documentation

## 2017-09-05 DIAGNOSIS — R279 Unspecified lack of coordination: Secondary | ICD-10-CM | POA: Diagnosis not present

## 2017-09-05 DIAGNOSIS — Y9389 Activity, other specified: Secondary | ICD-10-CM | POA: Diagnosis not present

## 2017-09-05 DIAGNOSIS — Z79899 Other long term (current) drug therapy: Secondary | ICD-10-CM | POA: Diagnosis not present

## 2017-09-05 DIAGNOSIS — I130 Hypertensive heart and chronic kidney disease with heart failure and stage 1 through stage 4 chronic kidney disease, or unspecified chronic kidney disease: Secondary | ICD-10-CM | POA: Diagnosis not present

## 2017-09-05 DIAGNOSIS — Z743 Need for continuous supervision: Secondary | ICD-10-CM | POA: Diagnosis not present

## 2017-09-05 DIAGNOSIS — I5032 Chronic diastolic (congestive) heart failure: Secondary | ICD-10-CM | POA: Diagnosis not present

## 2017-09-05 DIAGNOSIS — S3992XA Unspecified injury of lower back, initial encounter: Secondary | ICD-10-CM | POA: Diagnosis present

## 2017-09-05 DIAGNOSIS — M545 Low back pain: Secondary | ICD-10-CM | POA: Diagnosis not present

## 2017-09-05 DIAGNOSIS — I2510 Atherosclerotic heart disease of native coronary artery without angina pectoris: Secondary | ICD-10-CM | POA: Insufficient documentation

## 2017-09-05 DIAGNOSIS — R52 Pain, unspecified: Secondary | ICD-10-CM | POA: Diagnosis not present

## 2017-09-05 DIAGNOSIS — M542 Cervicalgia: Secondary | ICD-10-CM | POA: Diagnosis not present

## 2017-09-05 NOTE — ED Notes (Signed)
Patient transported to X-ray 

## 2017-09-05 NOTE — ED Notes (Signed)
[facility] has been called  

## 2017-09-05 NOTE — Discharge Instructions (Addendum)
Apply ice several times a day.  Take ibuprofen or acetaminophen as needed for pain.

## 2017-09-05 NOTE — ED Provider Notes (Signed)
[facility] DEPT Provider Note   CSN: [id number] Arrival date & time: 09/05/17  0548     History   Chief Complaint Chief Complaint  Patient presents with  . Fall  . Back Pain    HPI [name] is a 72 y.o. female.  The history is provided by the patient.  Fall   Back Pain    She has history of diabetes, hypertension, hyperlipidemia, coronary artery disease, stroke and comes in after falling at home.  She woke up and felt like she had to urinate.  As she was going to the bathroom, she felt that she would not be able to hold her urine and was reaching for her doors to get new underwear when she lost her balance and fell backwards.  She denies hitting her head and denies loss of consciousness.  She has been having "a catch" in her back for the last several days.  This is intermittent and seems to be a little worse since the fall.  She otherwise denies any injury from the fall.  They catch, when present, is rated at 8/10.  Past Medical History:  Diagnosis Date  . Adenocarcinoma of breast ([place]) 1996   Completed tamoxifen and had mastectomy.  . Aortic stenosis, severe    s/p aortic valve replacement with porcine valve 06/2004.  ECHO 2010 EF 40%, LVH, diastolic dysfxn, Bioprostetic aoritc valve, mild AS. ECHO 2013 EF 60%, Nl aortic artificial valve, dynamic obstruction in the outflow tract   Class IIb rec for annual TTE after 5 yrs. She had a TTE 2013    . Arthritis   . CAD (coronary artery disease) 2006   s/p CABG (5/06) w/ saphenous vein to RCA at time of AVR  . CVA (cerebral infarction) 2006   Post-op from AVR. Presumed embolic in nature. Carotid stenosis of R 60-79%. Repeat dopplers 4/10 no R stenosis and L stenosis of 1-29%.  . Depression    Controlled on Paxil  . Diabetes mellitus 1992   Dx 04/25/1990. Now insulin dependent, started 2008. On ACEI.   . Diverticulosis 2001  . Hyperlipidemia    Mgmt with a statin  . Hypertension    Requires 4  drug tx  . Osteoporosis 2006   DEXA 10/06 : L femur T -2.8, R -2.7. Lumbar T -2.4. On bisphosphonates and  Calcium / Vit D.  . Refusal of blood transfusions as patient is Jehovah's Witness     Patient Active Problem List   Diagnosis Date Noted  . AKI (acute kidney injury) ([place]) 07/22/2017  . Hypothermia 07/22/2017  . Hypoglycemia 07/21/2017  . Chronic diastolic CHF (congestive heart failure) ([place]) 06/22/2017  . Peripheral arterial disease ([place]) 06/18/2017  . B12 deficiency 05/01/2017  . Bilateral lower extremity edema 11/08/2015  . Aortic atherosclerosis ([place]) 06/07/2014  . Abnormality of gait 05/29/2012  . HOCM (hypertrophic obstructive cardiomyopathy) ([place]) 06/11/2011  . CKD (chronic kidney disease) stage 2, GFR 60-89 ml/min 06/03/2011  . Routine health maintenance 06/10/2010  . Hyperlipidemia   . Refusal of blood transfusions as patient is Jehovah's Witness   . History of adenocarcinoma of breast   . Osteoporosis   . Status post CVA   . Aortic stenosis s/p Tissure AVR 2006   . CAD (coronary artery disease)   . Hypertension   . Depression 01/23/2006  . Diabetes mellitus with renal manifestations, controlled ([place]) 03/25/1990    Past Surgical History:  Procedure Laterality Date  . ABDOMINAL HYSTERECTOMY  1987  for fibroids  . AORTIC VALVE REPLACEMENT  2006  . CHOLECYSTECTOMY    . CORONARY ARTERY BYPASS GRAFT  2006   Saphenous vein to RCA at time of AVR. Course complicated by acute respiratory failure, post-op PTX, ARI, ileus, CVA  . MASTECTOMY Left 1995   L for adenocarcinoma     OB History   None      Home Medications    Prior to Admission medications   Medication Sig Start Date End Date Taking? Authorizing Provider  ACCU-CHEK FASTCLIX LANCETS MISC Use to check blood sugar up to 3 times a day 04/01/17   [name], MD  acetaminophen (TYLENOL) 325 MG tablet Take 650 mg by mouth 4 (four) times daily as needed for mild pain or headache.     [provider]  amLODipine (NORVASC) 10 MG tablet Take 1 tablet (10 mg total) by mouth daily. 06/26/17   Thomasene Ripple, MD  aspirin 81 MG EC tablet TAKE 1 TABLET (81 MG TOTAL) BY MOUTH DAILY. SWALLOW WHOLE. 03/31/17   [name], MD  atorvastatin (LIPITOR) 80 MG tablet Take 1 tablet (80 mg total) by mouth daily at 6 PM. Patient taking differently: Take 80 mg by mouth at bedtime.  06/26/17   Thomasene Ripple, MD  docusate sodium (COLACE) 100 MG capsule Take 200 mg by mouth daily as needed for mild constipation.     [provider]  Emollient (EUCERIN) lotion Apply topically 2 (two) times daily.    [provider]  furosemide (LASIX) 40 MG tablet Take 1 tablet (40 mg total) by mouth 2 (two) times daily. Patient not taking: Reported on 07/21/2017 06/26/17   Thomasene Ripple, MD  glucose blood (ACCU-CHEK GUIDE) test strip Check blood sugar 3 times a day as instructed 04/01/17   [name], MD  guaiFENesin (ROBITUSSIN) 100 MG/5ML SOLN Take 5 mLs (100 mg total) by mouth every 4 (four) hours as needed for cough or to loosen phlegm. 07/23/17   Lorella Nimrod, MD  hydrALAZINE (APRESOLINE) 50 MG tablet Take 1 tablet (50 mg total) by mouth 2 (two) times daily. 06/26/17   Thomasene Ripple, MD  Insulin Detemir (LEVEMIR FLEXTOUCH) 100 UNIT/ML Pen Inject 12 Units into the skin at bedtime. Do not give insulin for blood sugar less then 150. 07/23/17   Lorella Nimrod, MD  Insulin Pen Needle (PEN NEEDLES 3/16") 31G X 5 MM MISC 1 each by Does not apply route 4 (four) times daily. Use to check blood sugars 4 times a day. Dx code E11.22 03/31/17   [name], MD  Loratadine 10 MG CAPS Take 1 capsule (10 mg total) by mouth daily. Patient not taking: Reported on 07/21/2017 06/07/14   [name], MD  metFORMIN (GLUCOPHAGE) 1000 MG tablet Take 1 tablet (1,000 mg total) by mouth 2 (two) times daily with a meal. 03/31/17   [name], MD  metoprolol succinate (TOPROL-XL) 25 MG 24  hr tablet Take 1 tablet (25 mg total) by mouth daily. Patient not taking: Reported on 07/21/2017 03/31/17   [name], MD  PARoxetine (PAXIL) 30 MG tablet Take 1 tablet (30 mg total) by mouth daily. 07/23/17   Lorella Nimrod, MD  polyethylene glycol (MIRALAX / GLYCOLAX) packet Take 17 g by mouth daily. 06/18/17   [name], MD  tetrahydrozoline 0.05 % ophthalmic solution Place 1 drop into both eyes as needed (dry eyes).    [provider]    Family History Family  History  Problem Relation Age of Onset  . Diabetes Mother   . Hypertension Mother   . Alzheimer's disease Mother   . Heart disease Father 24       AMI at age 1 and 41  . Mental illness Sister   . Heart disease Sister 3       AMI  . Kidney disease Sister     Social History Social History   Tobacco Use  . Smoking status: Never Smoker  . Smokeless tobacco: Never Used  Substance Use Topics  . Alcohol use: Yes    Alcohol/week: 0.0 oz    Comment: Occasional beer, monthly  . Drug use: No     Allergies   Other   Review of Systems Review of Systems  Musculoskeletal: Positive for back pain.  All other systems reviewed and are negative.    Physical Exam Updated Vital Signs BP (!) 141/78 (BP Location: Right Arm)   Pulse 72   Temp 98.2 F (36.8 C) (Oral)   Resp 14   Ht $R'5\' 6"'gR$  (1.676 m)   Wt 80.7 kg (178 lb)   SpO2 99%   BMI 28.73 kg/m   Physical Exam  Nursing note and vitals reviewed.  72 year old female, resting comfortably and in no acute distress. Vital signs are significant for borderline elevated blood pressure. Oxygen saturation is 99%, which is normal. Head is normocephalic and atraumatic. PERRLA, EOMI. Oropharynx is clear. Neck is nontender and supple without adenopathy or JVD. Back is mildly tender in the mid and upper lumbar area.  There is no paralumbar tenderness or spasm.  There is no CVA tenderness. Lungs are clear without rales, wheezes, or rhonchi. Chest is  nontender. Heart has regular rate and rhythm without murmur. Abdomen is soft, flat, nontender without masses or hepatosplenomegaly and peristalsis is normoactive. Extremities have no cyanosis or edema, full range of motion is present. Skin is warm and dry without rash. Neurologic: Mental status is normal, cranial nerves are intact, there are no motor or sensory deficits.  ED Treatments / Results   Radiology Dg Lumbar Spine Complete  Result Date: 09/05/2017 CLINICAL DATA:  Fall with pain. EXAM: LUMBAR SPINE - COMPLETE 4+ VIEW COMPARISON:  Chest x-ray Jul 21, 2017 FINDINGS: An anterior wedge deformity of T11 is stable since May of 2019, nonacute. No other evidence of acute fracture. No inter pedicular widening. No other acute abnormalities. Mild lower lumbar facet degenerative changes. Minimal degenerative disc disease. IMPRESSION: 1. Stable compression fracture of T11, unchanged since at least May of 2019. No acute fracture noted. 2. Degenerative changes. Electronically Signed   By: Dorise Bullion III M.D   On: 09/05/2017 08:12    Procedures Procedures  Medications Ordered in ED Medications - No data to display   Initial Impression / Assessment and Plan / ED Course  I have reviewed the triage vital signs and the nursing notes.  Pertinent imaging results that were available during my care of the patient were reviewed by me and considered in my medical decision making (see chart for details).  Fall at home without evidence of significant injury.  Back pain appears to have been present before the fall, but will send for x-rays.  Old records are reviewed, and she has no relevant past visits.  X-rays show no acute injury.  She is discharged with instructions to use over-the-counter analgesics as needed for pain, apply ice.  Final Clinical Impressions(s) / ED Diagnoses   Final diagnoses:  Fall at  home, initial encounter  Contusion of lower back, initial encounter    ED Discharge  Orders    None       Delora Fuel, MD 25/36/64 [phone]

## 2017-09-05 NOTE — ED Triage Notes (Signed)
Patient arrives by [facility]-unwitnessed fall at 0500-patient having back pain "all over" for the past 3 days. EMS found patient sitting on the toliet-staff helped patient get up from the floor and sat her on the toliet and EMS states "that's when they noticed the back pain".

## 2017-09-07 DIAGNOSIS — H04129 Dry eye syndrome of unspecified lacrimal gland: Secondary | ICD-10-CM | POA: Diagnosis not present

## 2017-09-07 DIAGNOSIS — R2689 Other abnormalities of gait and mobility: Secondary | ICD-10-CM | POA: Diagnosis not present

## 2017-09-07 DIAGNOSIS — E1122 Type 2 diabetes mellitus with diabetic chronic kidney disease: Secondary | ICD-10-CM | POA: Diagnosis not present

## 2017-09-07 DIAGNOSIS — E785 Hyperlipidemia, unspecified: Secondary | ICD-10-CM | POA: Diagnosis not present

## 2017-09-07 DIAGNOSIS — I350 Nonrheumatic aortic (valve) stenosis: Secondary | ICD-10-CM | POA: Diagnosis not present

## 2017-09-07 DIAGNOSIS — I509 Heart failure, unspecified: Secondary | ICD-10-CM | POA: Diagnosis not present

## 2017-09-09 DIAGNOSIS — E785 Hyperlipidemia, unspecified: Secondary | ICD-10-CM | POA: Diagnosis not present

## 2017-09-09 DIAGNOSIS — M5431 Sciatica, right side: Secondary | ICD-10-CM | POA: Diagnosis not present

## 2017-09-09 DIAGNOSIS — H04129 Dry eye syndrome of unspecified lacrimal gland: Secondary | ICD-10-CM | POA: Diagnosis not present

## 2017-09-09 DIAGNOSIS — I350 Nonrheumatic aortic (valve) stenosis: Secondary | ICD-10-CM | POA: Diagnosis not present

## 2017-09-09 DIAGNOSIS — E1122 Type 2 diabetes mellitus with diabetic chronic kidney disease: Secondary | ICD-10-CM | POA: Diagnosis not present

## 2017-09-09 DIAGNOSIS — I509 Heart failure, unspecified: Secondary | ICD-10-CM | POA: Diagnosis not present

## 2017-09-09 DIAGNOSIS — R2689 Other abnormalities of gait and mobility: Secondary | ICD-10-CM | POA: Diagnosis not present

## 2017-09-15 DIAGNOSIS — H04129 Dry eye syndrome of unspecified lacrimal gland: Secondary | ICD-10-CM | POA: Diagnosis not present

## 2017-09-15 DIAGNOSIS — R2689 Other abnormalities of gait and mobility: Secondary | ICD-10-CM | POA: Diagnosis not present

## 2017-09-15 DIAGNOSIS — E1122 Type 2 diabetes mellitus with diabetic chronic kidney disease: Secondary | ICD-10-CM | POA: Diagnosis not present

## 2017-09-15 DIAGNOSIS — I350 Nonrheumatic aortic (valve) stenosis: Secondary | ICD-10-CM | POA: Diagnosis not present

## 2017-09-15 DIAGNOSIS — E785 Hyperlipidemia, unspecified: Secondary | ICD-10-CM | POA: Diagnosis not present

## 2017-09-15 DIAGNOSIS — I509 Heart failure, unspecified: Secondary | ICD-10-CM | POA: Diagnosis not present

## 2017-09-16 DIAGNOSIS — I509 Heart failure, unspecified: Secondary | ICD-10-CM | POA: Diagnosis not present

## 2017-09-16 DIAGNOSIS — H04129 Dry eye syndrome of unspecified lacrimal gland: Secondary | ICD-10-CM | POA: Diagnosis not present

## 2017-09-16 DIAGNOSIS — E1122 Type 2 diabetes mellitus with diabetic chronic kidney disease: Secondary | ICD-10-CM | POA: Diagnosis not present

## 2017-09-16 DIAGNOSIS — R2689 Other abnormalities of gait and mobility: Secondary | ICD-10-CM | POA: Diagnosis not present

## 2017-09-16 DIAGNOSIS — I350 Nonrheumatic aortic (valve) stenosis: Secondary | ICD-10-CM | POA: Diagnosis not present

## 2017-09-16 DIAGNOSIS — E785 Hyperlipidemia, unspecified: Secondary | ICD-10-CM | POA: Diagnosis not present

## 2017-09-17 DIAGNOSIS — E785 Hyperlipidemia, unspecified: Secondary | ICD-10-CM | POA: Diagnosis not present

## 2017-09-17 DIAGNOSIS — R2689 Other abnormalities of gait and mobility: Secondary | ICD-10-CM | POA: Diagnosis not present

## 2017-09-17 DIAGNOSIS — H04129 Dry eye syndrome of unspecified lacrimal gland: Secondary | ICD-10-CM | POA: Diagnosis not present

## 2017-09-17 DIAGNOSIS — I509 Heart failure, unspecified: Secondary | ICD-10-CM | POA: Diagnosis not present

## 2017-09-17 DIAGNOSIS — I350 Nonrheumatic aortic (valve) stenosis: Secondary | ICD-10-CM | POA: Diagnosis not present

## 2017-09-17 DIAGNOSIS — E1122 Type 2 diabetes mellitus with diabetic chronic kidney disease: Secondary | ICD-10-CM | POA: Diagnosis not present

## 2017-09-22 DIAGNOSIS — I509 Heart failure, unspecified: Secondary | ICD-10-CM | POA: Diagnosis not present

## 2017-09-22 DIAGNOSIS — I350 Nonrheumatic aortic (valve) stenosis: Secondary | ICD-10-CM | POA: Diagnosis not present

## 2017-09-22 DIAGNOSIS — E1122 Type 2 diabetes mellitus with diabetic chronic kidney disease: Secondary | ICD-10-CM | POA: Diagnosis not present

## 2017-09-22 DIAGNOSIS — H04129 Dry eye syndrome of unspecified lacrimal gland: Secondary | ICD-10-CM | POA: Diagnosis not present

## 2017-09-22 DIAGNOSIS — E785 Hyperlipidemia, unspecified: Secondary | ICD-10-CM | POA: Diagnosis not present

## 2017-09-22 DIAGNOSIS — R32 Unspecified urinary incontinence: Secondary | ICD-10-CM | POA: Diagnosis not present

## 2017-09-22 DIAGNOSIS — R2689 Other abnormalities of gait and mobility: Secondary | ICD-10-CM | POA: Diagnosis not present
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
## 2017-09-23 ENCOUNTER — Ambulatory Visit: Payer: Medicare Other | Admitting: Physician Assistant

## 2017-09-23 DIAGNOSIS — H04129 Dry eye syndrome of unspecified lacrimal gland: Secondary | ICD-10-CM | POA: Diagnosis not present

## 2017-09-23 DIAGNOSIS — I509 Heart failure, unspecified: Secondary | ICD-10-CM | POA: Diagnosis not present

## 2017-09-23 DIAGNOSIS — E1122 Type 2 diabetes mellitus with diabetic chronic kidney disease: Secondary | ICD-10-CM | POA: Diagnosis not present

## 2017-09-23 DIAGNOSIS — I350 Nonrheumatic aortic (valve) stenosis: Secondary | ICD-10-CM | POA: Diagnosis not present

## 2017-09-23 DIAGNOSIS — E785 Hyperlipidemia, unspecified: Secondary | ICD-10-CM | POA: Diagnosis not present

## 2017-09-23 DIAGNOSIS — R2689 Other abnormalities of gait and mobility: Secondary | ICD-10-CM | POA: Diagnosis not present

## 2017-09-25 DIAGNOSIS — I509 Heart failure, unspecified: Secondary | ICD-10-CM | POA: Diagnosis not present

## 2017-09-25 DIAGNOSIS — E785 Hyperlipidemia, unspecified: Secondary | ICD-10-CM | POA: Diagnosis not present

## 2017-09-25 DIAGNOSIS — E1122 Type 2 diabetes mellitus with diabetic chronic kidney disease: Secondary | ICD-10-CM | POA: Diagnosis not present

## 2017-09-25 DIAGNOSIS — H04129 Dry eye syndrome of unspecified lacrimal gland: Secondary | ICD-10-CM | POA: Diagnosis not present

## 2017-09-25 DIAGNOSIS — I350 Nonrheumatic aortic (valve) stenosis: Secondary | ICD-10-CM | POA: Diagnosis not present

## 2017-09-25 DIAGNOSIS — R2689 Other abnormalities of gait and mobility: Secondary | ICD-10-CM | POA: Diagnosis not present

## 2017-09-28 DIAGNOSIS — E785 Hyperlipidemia, unspecified: Secondary | ICD-10-CM | POA: Diagnosis not present

## 2017-09-28 DIAGNOSIS — H04129 Dry eye syndrome of unspecified lacrimal gland: Secondary | ICD-10-CM | POA: Diagnosis not present

## 2017-09-28 DIAGNOSIS — I350 Nonrheumatic aortic (valve) stenosis: Secondary | ICD-10-CM | POA: Diagnosis not present

## 2017-09-28 DIAGNOSIS — R2689 Other abnormalities of gait and mobility: Secondary | ICD-10-CM | POA: Diagnosis not present

## 2017-09-28 DIAGNOSIS — E1122 Type 2 diabetes mellitus with diabetic chronic kidney disease: Secondary | ICD-10-CM | POA: Diagnosis not present

## 2017-09-28 DIAGNOSIS — I509 Heart failure, unspecified: Secondary | ICD-10-CM | POA: Diagnosis not present

## 2017-09-29 DIAGNOSIS — I350 Nonrheumatic aortic (valve) stenosis: Secondary | ICD-10-CM | POA: Diagnosis not present

## 2017-09-29 DIAGNOSIS — E785 Hyperlipidemia, unspecified: Secondary | ICD-10-CM | POA: Diagnosis not present

## 2017-09-29 DIAGNOSIS — I509 Heart failure, unspecified: Secondary | ICD-10-CM | POA: Diagnosis not present

## 2017-09-29 DIAGNOSIS — H04129 Dry eye syndrome of unspecified lacrimal gland: Secondary | ICD-10-CM | POA: Diagnosis not present

## 2017-09-29 DIAGNOSIS — E1122 Type 2 diabetes mellitus with diabetic chronic kidney disease: Secondary | ICD-10-CM | POA: Diagnosis not present

## 2017-09-29 DIAGNOSIS — R2689 Other abnormalities of gait and mobility: Secondary | ICD-10-CM | POA: Diagnosis not present

## 2017-09-30 ENCOUNTER — Encounter: Payer: Self-pay | Admitting: Physician Assistant

## 2017-09-30 NOTE — Addendum Note (Signed)
Addended by: Hulan Fray on: 09/30/2017 03:39 PM   Modules accepted: Orders

## 2017-10-05 DIAGNOSIS — R2689 Other abnormalities of gait and mobility: Secondary | ICD-10-CM | POA: Diagnosis not present

## 2017-10-05 DIAGNOSIS — H04129 Dry eye syndrome of unspecified lacrimal gland: Secondary | ICD-10-CM | POA: Diagnosis not present

## 2017-10-05 DIAGNOSIS — E1122 Type 2 diabetes mellitus with diabetic chronic kidney disease: Secondary | ICD-10-CM | POA: Diagnosis not present

## 2017-10-05 DIAGNOSIS — I509 Heart failure, unspecified: Secondary | ICD-10-CM | POA: Diagnosis not present

## 2017-10-05 DIAGNOSIS — I350 Nonrheumatic aortic (valve) stenosis: Secondary | ICD-10-CM | POA: Diagnosis not present

## 2017-10-05 DIAGNOSIS — E785 Hyperlipidemia, unspecified: Secondary | ICD-10-CM | POA: Diagnosis not present

## 2017-10-07 DIAGNOSIS — I509 Heart failure, unspecified: Secondary | ICD-10-CM | POA: Diagnosis not present

## 2017-10-07 DIAGNOSIS — R2689 Other abnormalities of gait and mobility: Secondary | ICD-10-CM | POA: Diagnosis not present

## 2017-10-07 DIAGNOSIS — E1122 Type 2 diabetes mellitus with diabetic chronic kidney disease: Secondary | ICD-10-CM | POA: Diagnosis not present

## 2017-10-07 DIAGNOSIS — I350 Nonrheumatic aortic (valve) stenosis: Secondary | ICD-10-CM | POA: Diagnosis not present

## 2017-10-07 DIAGNOSIS — H04129 Dry eye syndrome of unspecified lacrimal gland: Secondary | ICD-10-CM | POA: Diagnosis not present

## 2017-10-07 DIAGNOSIS — E785 Hyperlipidemia, unspecified: Secondary | ICD-10-CM | POA: Diagnosis not present

## 2017-10-12 DIAGNOSIS — I509 Heart failure, unspecified: Secondary | ICD-10-CM | POA: Diagnosis not present

## 2017-10-12 DIAGNOSIS — E785 Hyperlipidemia, unspecified: Secondary | ICD-10-CM | POA: Diagnosis not present

## 2017-10-12 DIAGNOSIS — H04129 Dry eye syndrome of unspecified lacrimal gland: Secondary | ICD-10-CM | POA: Diagnosis not present

## 2017-10-12 DIAGNOSIS — E1122 Type 2 diabetes mellitus with diabetic chronic kidney disease: Secondary | ICD-10-CM | POA: Diagnosis not present

## 2017-10-12 DIAGNOSIS — R2689 Other abnormalities of gait and mobility: Secondary | ICD-10-CM | POA: Diagnosis not present

## 2017-10-12 DIAGNOSIS — I350 Nonrheumatic aortic (valve) stenosis: Secondary | ICD-10-CM | POA: Diagnosis not present

## 2017-10-13 DIAGNOSIS — R2689 Other abnormalities of gait and mobility: Secondary | ICD-10-CM | POA: Diagnosis not present

## 2017-10-13 DIAGNOSIS — H04129 Dry eye syndrome of unspecified lacrimal gland: Secondary | ICD-10-CM | POA: Diagnosis not present

## 2017-10-13 DIAGNOSIS — I350 Nonrheumatic aortic (valve) stenosis: Secondary | ICD-10-CM | POA: Diagnosis not present

## 2017-10-13 DIAGNOSIS — E1122 Type 2 diabetes mellitus with diabetic chronic kidney disease: Secondary | ICD-10-CM | POA: Diagnosis not present

## 2017-10-13 DIAGNOSIS — E785 Hyperlipidemia, unspecified: Secondary | ICD-10-CM | POA: Diagnosis not present

## 2017-10-13 DIAGNOSIS — I509 Heart failure, unspecified: Secondary | ICD-10-CM | POA: Diagnosis not present

## 2017-10-14 DIAGNOSIS — I509 Heart failure, unspecified: Secondary | ICD-10-CM | POA: Diagnosis not present

## 2017-10-14 DIAGNOSIS — E785 Hyperlipidemia, unspecified: Secondary | ICD-10-CM | POA: Diagnosis not present

## 2017-10-14 DIAGNOSIS — R2689 Other abnormalities of gait and mobility: Secondary | ICD-10-CM | POA: Diagnosis not present

## 2017-10-14 DIAGNOSIS — H04129 Dry eye syndrome of unspecified lacrimal gland: Secondary | ICD-10-CM | POA: Diagnosis not present

## 2017-10-14 DIAGNOSIS — I350 Nonrheumatic aortic (valve) stenosis: Secondary | ICD-10-CM | POA: Diagnosis not present

## 2017-10-14 DIAGNOSIS — E1122 Type 2 diabetes mellitus with diabetic chronic kidney disease: Secondary | ICD-10-CM | POA: Diagnosis not present

## 2017-10-19 DIAGNOSIS — E1122 Type 2 diabetes mellitus with diabetic chronic kidney disease: Secondary | ICD-10-CM | POA: Diagnosis not present

## 2017-10-19 DIAGNOSIS — R2689 Other abnormalities of gait and mobility: Secondary | ICD-10-CM | POA: Diagnosis not present

## 2017-10-19 DIAGNOSIS — I350 Nonrheumatic aortic (valve) stenosis: Secondary | ICD-10-CM | POA: Diagnosis not present

## 2017-10-19 DIAGNOSIS — I509 Heart failure, unspecified: Secondary | ICD-10-CM | POA: Diagnosis not present

## 2017-10-19 DIAGNOSIS — H04129 Dry eye syndrome of unspecified lacrimal gland: Secondary | ICD-10-CM | POA: Diagnosis not present

## 2017-10-19 DIAGNOSIS — E785 Hyperlipidemia, unspecified: Secondary | ICD-10-CM | POA: Diagnosis not present

## 2017-10-22 DIAGNOSIS — E785 Hyperlipidemia, unspecified: Secondary | ICD-10-CM | POA: Diagnosis not present

## 2017-10-22 DIAGNOSIS — E1122 Type 2 diabetes mellitus with diabetic chronic kidney disease: Secondary | ICD-10-CM | POA: Diagnosis not present

## 2017-10-22 DIAGNOSIS — H04129 Dry eye syndrome of unspecified lacrimal gland: Secondary | ICD-10-CM | POA: Diagnosis not present

## 2017-10-22 DIAGNOSIS — R2689 Other abnormalities of gait and mobility: Secondary | ICD-10-CM | POA: Diagnosis not present

## 2017-10-22 DIAGNOSIS — I509 Heart failure, unspecified: Secondary | ICD-10-CM | POA: Diagnosis not present

## 2017-10-22 DIAGNOSIS — H2513 Age-related nuclear cataract, bilateral: Secondary | ICD-10-CM | POA: Diagnosis not present

## 2017-10-22 DIAGNOSIS — I350 Nonrheumatic aortic (valve) stenosis: Secondary | ICD-10-CM | POA: Diagnosis not present

## 2017-10-28 DIAGNOSIS — H04129 Dry eye syndrome of unspecified lacrimal gland: Secondary | ICD-10-CM | POA: Diagnosis not present

## 2017-10-28 DIAGNOSIS — E1122 Type 2 diabetes mellitus with diabetic chronic kidney disease: Secondary | ICD-10-CM | POA: Diagnosis not present

## 2017-10-28 DIAGNOSIS — I509 Heart failure, unspecified: Secondary | ICD-10-CM | POA: Diagnosis not present

## 2017-10-28 DIAGNOSIS — R2689 Other abnormalities of gait and mobility: Secondary | ICD-10-CM | POA: Diagnosis not present

## 2017-10-28 DIAGNOSIS — I350 Nonrheumatic aortic (valve) stenosis: Secondary | ICD-10-CM | POA: Diagnosis not present

## 2017-10-28 DIAGNOSIS — E785 Hyperlipidemia, unspecified: Secondary | ICD-10-CM | POA: Diagnosis not present

## 2017-10-29 DIAGNOSIS — E1122 Type 2 diabetes mellitus with diabetic chronic kidney disease: Secondary | ICD-10-CM | POA: Diagnosis not present

## 2017-10-29 DIAGNOSIS — R2689 Other abnormalities of gait and mobility: Secondary | ICD-10-CM | POA: Diagnosis not present

## 2017-10-29 DIAGNOSIS — I509 Heart failure, unspecified: Secondary | ICD-10-CM | POA: Diagnosis not present

## 2017-10-29 DIAGNOSIS — I350 Nonrheumatic aortic (valve) stenosis: Secondary | ICD-10-CM | POA: Diagnosis not present

## 2017-10-29 DIAGNOSIS — H04129 Dry eye syndrome of unspecified lacrimal gland: Secondary | ICD-10-CM | POA: Diagnosis not present

## 2017-10-29 DIAGNOSIS — E785 Hyperlipidemia, unspecified: Secondary | ICD-10-CM | POA: Diagnosis not present

## 2017-10-30 DIAGNOSIS — E1122 Type 2 diabetes mellitus with diabetic chronic kidney disease: Secondary | ICD-10-CM | POA: Diagnosis not present

## 2017-10-30 DIAGNOSIS — E785 Hyperlipidemia, unspecified: Secondary | ICD-10-CM | POA: Diagnosis not present

## 2017-10-30 DIAGNOSIS — I350 Nonrheumatic aortic (valve) stenosis: Secondary | ICD-10-CM | POA: Diagnosis not present

## 2017-10-30 DIAGNOSIS — I509 Heart failure, unspecified: Secondary | ICD-10-CM | POA: Diagnosis not present

## 2017-10-30 DIAGNOSIS — H04129 Dry eye syndrome of unspecified lacrimal gland: Secondary | ICD-10-CM | POA: Diagnosis not present

## 2017-10-30 DIAGNOSIS — R2689 Other abnormalities of gait and mobility: Secondary | ICD-10-CM | POA: Diagnosis not present

## 2017-11-02 DIAGNOSIS — R2689 Other abnormalities of gait and mobility: Secondary | ICD-10-CM | POA: Diagnosis not present

## 2017-11-02 DIAGNOSIS — I509 Heart failure, unspecified: Secondary | ICD-10-CM | POA: Diagnosis not present

## 2017-11-02 DIAGNOSIS — E785 Hyperlipidemia, unspecified: Secondary | ICD-10-CM | POA: Diagnosis not present

## 2017-11-02 DIAGNOSIS — H04129 Dry eye syndrome of unspecified lacrimal gland: Secondary | ICD-10-CM | POA: Diagnosis not present

## 2017-11-02 DIAGNOSIS — E1122 Type 2 diabetes mellitus with diabetic chronic kidney disease: Secondary | ICD-10-CM | POA: Diagnosis not present

## 2017-11-02 DIAGNOSIS — I350 Nonrheumatic aortic (valve) stenosis: Secondary | ICD-10-CM | POA: Diagnosis not present

## 2017-11-05 DIAGNOSIS — E785 Hyperlipidemia, unspecified: Secondary | ICD-10-CM | POA: Diagnosis not present

## 2017-11-05 DIAGNOSIS — R2689 Other abnormalities of gait and mobility: Secondary | ICD-10-CM | POA: Diagnosis not present

## 2017-11-05 DIAGNOSIS — H04129 Dry eye syndrome of unspecified lacrimal gland: Secondary | ICD-10-CM | POA: Diagnosis not present

## 2017-11-05 DIAGNOSIS — E1122 Type 2 diabetes mellitus with diabetic chronic kidney disease: Secondary | ICD-10-CM | POA: Diagnosis not present

## 2017-11-05 DIAGNOSIS — I350 Nonrheumatic aortic (valve) stenosis: Secondary | ICD-10-CM | POA: Diagnosis not present

## 2017-11-05 DIAGNOSIS — I509 Heart failure, unspecified: Secondary | ICD-10-CM | POA: Diagnosis not present

## 2017-11-06 DIAGNOSIS — I509 Heart failure, unspecified: Secondary | ICD-10-CM | POA: Diagnosis not present

## 2017-11-06 DIAGNOSIS — E1122 Type 2 diabetes mellitus with diabetic chronic kidney disease: Secondary | ICD-10-CM | POA: Diagnosis not present

## 2017-11-06 DIAGNOSIS — R2689 Other abnormalities of gait and mobility: Secondary | ICD-10-CM | POA: Diagnosis not present

## 2017-11-06 DIAGNOSIS — I350 Nonrheumatic aortic (valve) stenosis: Secondary | ICD-10-CM | POA: Diagnosis not present

## 2017-11-06 DIAGNOSIS — H04129 Dry eye syndrome of unspecified lacrimal gland: Secondary | ICD-10-CM | POA: Diagnosis not present

## 2017-11-06 DIAGNOSIS — E785 Hyperlipidemia, unspecified: Secondary | ICD-10-CM | POA: Diagnosis not present

## 2017-11-09 DIAGNOSIS — H04129 Dry eye syndrome of unspecified lacrimal gland: Secondary | ICD-10-CM | POA: Diagnosis not present

## 2017-11-09 DIAGNOSIS — I509 Heart failure, unspecified: Secondary | ICD-10-CM | POA: Diagnosis not present

## 2017-11-09 DIAGNOSIS — E785 Hyperlipidemia, unspecified: Secondary | ICD-10-CM | POA: Diagnosis not present

## 2017-11-09 DIAGNOSIS — R2689 Other abnormalities of gait and mobility: Secondary | ICD-10-CM | POA: Diagnosis not present

## 2017-11-09 DIAGNOSIS — I350 Nonrheumatic aortic (valve) stenosis: Secondary | ICD-10-CM | POA: Diagnosis not present

## 2017-11-09 DIAGNOSIS — E1122 Type 2 diabetes mellitus with diabetic chronic kidney disease: Secondary | ICD-10-CM | POA: Diagnosis not present

## 2017-11-11 DIAGNOSIS — E1122 Type 2 diabetes mellitus with diabetic chronic kidney disease: Secondary | ICD-10-CM | POA: Diagnosis not present

## 2017-11-11 DIAGNOSIS — I350 Nonrheumatic aortic (valve) stenosis: Secondary | ICD-10-CM | POA: Diagnosis not present

## 2017-11-11 DIAGNOSIS — H04129 Dry eye syndrome of unspecified lacrimal gland: Secondary | ICD-10-CM | POA: Diagnosis not present

## 2017-11-11 DIAGNOSIS — E785 Hyperlipidemia, unspecified: Secondary | ICD-10-CM | POA: Diagnosis not present

## 2017-11-11 DIAGNOSIS — I509 Heart failure, unspecified: Secondary | ICD-10-CM | POA: Diagnosis not present

## 2017-11-11 DIAGNOSIS — R2689 Other abnormalities of gait and mobility: Secondary | ICD-10-CM | POA: Diagnosis not present

## 2017-11-12 DIAGNOSIS — I509 Heart failure, unspecified: Secondary | ICD-10-CM | POA: Diagnosis not present

## 2017-11-12 DIAGNOSIS — E1122 Type 2 diabetes mellitus with diabetic chronic kidney disease: Secondary | ICD-10-CM | POA: Diagnosis not present

## 2017-11-12 DIAGNOSIS — E785 Hyperlipidemia, unspecified: Secondary | ICD-10-CM | POA: Diagnosis not present

## 2017-11-12 DIAGNOSIS — R2689 Other abnormalities of gait and mobility: Secondary | ICD-10-CM | POA: Diagnosis not present

## 2017-11-12 DIAGNOSIS — I350 Nonrheumatic aortic (valve) stenosis: Secondary | ICD-10-CM | POA: Diagnosis not present

## 2017-11-12 DIAGNOSIS — H04129 Dry eye syndrome of unspecified lacrimal gland: Secondary | ICD-10-CM | POA: Diagnosis not present

## 2017-11-16 DIAGNOSIS — R2689 Other abnormalities of gait and mobility: Secondary | ICD-10-CM | POA: Diagnosis not present

## 2017-11-16 DIAGNOSIS — E785 Hyperlipidemia, unspecified: Secondary | ICD-10-CM | POA: Diagnosis not present

## 2017-11-16 DIAGNOSIS — I350 Nonrheumatic aortic (valve) stenosis: Secondary | ICD-10-CM | POA: Diagnosis not present

## 2017-11-16 DIAGNOSIS — H04129 Dry eye syndrome of unspecified lacrimal gland: Secondary | ICD-10-CM | POA: Diagnosis not present

## 2017-11-16 DIAGNOSIS — E1122 Type 2 diabetes mellitus with diabetic chronic kidney disease: Secondary | ICD-10-CM | POA: Diagnosis not present

## 2017-11-16 DIAGNOSIS — I509 Heart failure, unspecified: Secondary | ICD-10-CM | POA: Diagnosis not present

## 2017-11-20 DIAGNOSIS — E785 Hyperlipidemia, unspecified: Secondary | ICD-10-CM | POA: Diagnosis not present

## 2017-11-20 DIAGNOSIS — I509 Heart failure, unspecified: Secondary | ICD-10-CM | POA: Diagnosis not present

## 2017-11-20 DIAGNOSIS — R2689 Other abnormalities of gait and mobility: Secondary | ICD-10-CM | POA: Diagnosis not present

## 2017-11-20 DIAGNOSIS — I350 Nonrheumatic aortic (valve) stenosis: Secondary | ICD-10-CM | POA: Diagnosis not present

## 2017-11-20 DIAGNOSIS — E1122 Type 2 diabetes mellitus with diabetic chronic kidney disease: Secondary | ICD-10-CM | POA: Diagnosis not present

## 2017-11-20 DIAGNOSIS — H04129 Dry eye syndrome of unspecified lacrimal gland: Secondary | ICD-10-CM | POA: Diagnosis not present

## 2017-11-21 DIAGNOSIS — E1122 Type 2 diabetes mellitus with diabetic chronic kidney disease: Secondary | ICD-10-CM | POA: Diagnosis not present

## 2017-11-21 DIAGNOSIS — E785 Hyperlipidemia, unspecified: Secondary | ICD-10-CM | POA: Diagnosis not present

## 2017-11-21 DIAGNOSIS — I509 Heart failure, unspecified: Secondary | ICD-10-CM | POA: Diagnosis not present

## 2017-11-21 DIAGNOSIS — I350 Nonrheumatic aortic (valve) stenosis: Secondary | ICD-10-CM | POA: Diagnosis not present

## 2017-11-21 DIAGNOSIS — R2689 Other abnormalities of gait and mobility: Secondary | ICD-10-CM | POA: Diagnosis not present

## 2017-11-21 DIAGNOSIS — H04129 Dry eye syndrome of unspecified lacrimal gland: Secondary | ICD-10-CM | POA: Diagnosis not present

## 2017-11-21 DIAGNOSIS — R32 Unspecified urinary incontinence: Secondary | ICD-10-CM | POA: Diagnosis not present

## 2017-11-24 DIAGNOSIS — H04129 Dry eye syndrome of unspecified lacrimal gland: Secondary | ICD-10-CM | POA: Diagnosis not present

## 2017-11-24 DIAGNOSIS — R2689 Other abnormalities of gait and mobility: Secondary | ICD-10-CM | POA: Diagnosis not present

## 2017-11-24 DIAGNOSIS — E1122 Type 2 diabetes mellitus with diabetic chronic kidney disease: Secondary | ICD-10-CM | POA: Diagnosis not present

## 2017-11-24 DIAGNOSIS — I509 Heart failure, unspecified: Secondary | ICD-10-CM | POA: Diagnosis not present

## 2017-11-24 DIAGNOSIS — I350 Nonrheumatic aortic (valve) stenosis: Secondary | ICD-10-CM | POA: Diagnosis not present

## 2017-11-24 DIAGNOSIS — E785 Hyperlipidemia, unspecified: Secondary | ICD-10-CM | POA: Diagnosis not present

## 2017-11-27 DIAGNOSIS — E785 Hyperlipidemia, unspecified: Secondary | ICD-10-CM | POA: Diagnosis not present

## 2017-11-27 DIAGNOSIS — E1122 Type 2 diabetes mellitus with diabetic chronic kidney disease: Secondary | ICD-10-CM | POA: Diagnosis not present

## 2017-11-27 DIAGNOSIS — I350 Nonrheumatic aortic (valve) stenosis: Secondary | ICD-10-CM | POA: Diagnosis not present

## 2017-11-27 DIAGNOSIS — H04129 Dry eye syndrome of unspecified lacrimal gland: Secondary | ICD-10-CM | POA: Diagnosis not present

## 2017-11-27 DIAGNOSIS — R2689 Other abnormalities of gait and mobility: Secondary | ICD-10-CM | POA: Diagnosis not present

## 2017-11-27 DIAGNOSIS — I509 Heart failure, unspecified: Secondary | ICD-10-CM | POA: Diagnosis not present

## 2017-11-30 DIAGNOSIS — I350 Nonrheumatic aortic (valve) stenosis: Secondary | ICD-10-CM | POA: Diagnosis not present

## 2017-11-30 DIAGNOSIS — E1122 Type 2 diabetes mellitus with diabetic chronic kidney disease: Secondary | ICD-10-CM | POA: Diagnosis not present

## 2017-11-30 DIAGNOSIS — E785 Hyperlipidemia, unspecified: Secondary | ICD-10-CM | POA: Diagnosis not present

## 2017-11-30 DIAGNOSIS — H04129 Dry eye syndrome of unspecified lacrimal gland: Secondary | ICD-10-CM | POA: Diagnosis not present

## 2017-11-30 DIAGNOSIS — I509 Heart failure, unspecified: Secondary | ICD-10-CM | POA: Diagnosis not present

## 2017-11-30 DIAGNOSIS — R2689 Other abnormalities of gait and mobility: Secondary | ICD-10-CM | POA: Diagnosis not present

## 2017-12-03 DIAGNOSIS — R2689 Other abnormalities of gait and mobility: Secondary | ICD-10-CM | POA: Diagnosis not present

## 2017-12-03 DIAGNOSIS — I350 Nonrheumatic aortic (valve) stenosis: Secondary | ICD-10-CM | POA: Diagnosis not present

## 2017-12-03 DIAGNOSIS — I509 Heart failure, unspecified: Secondary | ICD-10-CM | POA: Diagnosis not present

## 2017-12-03 DIAGNOSIS — E785 Hyperlipidemia, unspecified: Secondary | ICD-10-CM | POA: Diagnosis not present

## 2017-12-03 DIAGNOSIS — H04129 Dry eye syndrome of unspecified lacrimal gland: Secondary | ICD-10-CM | POA: Diagnosis not present

## 2017-12-03 DIAGNOSIS — E1122 Type 2 diabetes mellitus with diabetic chronic kidney disease: Secondary | ICD-10-CM | POA: Diagnosis not present

## 2017-12-08 DIAGNOSIS — I509 Heart failure, unspecified: Secondary | ICD-10-CM | POA: Diagnosis not present

## 2017-12-08 DIAGNOSIS — E1122 Type 2 diabetes mellitus with diabetic chronic kidney disease: Secondary | ICD-10-CM | POA: Diagnosis not present

## 2017-12-08 DIAGNOSIS — I350 Nonrheumatic aortic (valve) stenosis: Secondary | ICD-10-CM | POA: Diagnosis not present

## 2017-12-08 DIAGNOSIS — H04129 Dry eye syndrome of unspecified lacrimal gland: Secondary | ICD-10-CM | POA: Diagnosis not present

## 2017-12-08 DIAGNOSIS — E785 Hyperlipidemia, unspecified: Secondary | ICD-10-CM | POA: Diagnosis not present

## 2017-12-08 DIAGNOSIS — R2689 Other abnormalities of gait and mobility: Secondary | ICD-10-CM | POA: Diagnosis not present

## 2017-12-09 DIAGNOSIS — R2689 Other abnormalities of gait and mobility: Secondary | ICD-10-CM | POA: Diagnosis not present

## 2017-12-09 DIAGNOSIS — H04129 Dry eye syndrome of unspecified lacrimal gland: Secondary | ICD-10-CM | POA: Diagnosis not present

## 2017-12-09 DIAGNOSIS — I350 Nonrheumatic aortic (valve) stenosis: Secondary | ICD-10-CM | POA: Diagnosis not present

## 2017-12-09 DIAGNOSIS — E785 Hyperlipidemia, unspecified: Secondary | ICD-10-CM | POA: Diagnosis not present

## 2017-12-09 DIAGNOSIS — I509 Heart failure, unspecified: Secondary | ICD-10-CM | POA: Diagnosis not present

## 2017-12-09 DIAGNOSIS — E1122 Type 2 diabetes mellitus with diabetic chronic kidney disease: Secondary | ICD-10-CM | POA: Diagnosis not present

## 2017-12-10 DIAGNOSIS — I350 Nonrheumatic aortic (valve) stenosis: Secondary | ICD-10-CM | POA: Diagnosis not present

## 2017-12-10 DIAGNOSIS — H04129 Dry eye syndrome of unspecified lacrimal gland: Secondary | ICD-10-CM | POA: Diagnosis not present

## 2017-12-10 DIAGNOSIS — R2689 Other abnormalities of gait and mobility: Secondary | ICD-10-CM | POA: Diagnosis not present

## 2017-12-10 DIAGNOSIS — E785 Hyperlipidemia, unspecified: Secondary | ICD-10-CM | POA: Diagnosis not present

## 2017-12-10 DIAGNOSIS — I509 Heart failure, unspecified: Secondary | ICD-10-CM | POA: Diagnosis not present

## 2017-12-10 DIAGNOSIS — E1122 Type 2 diabetes mellitus with diabetic chronic kidney disease: Secondary | ICD-10-CM | POA: Diagnosis not present

## 2017-12-14 DIAGNOSIS — E1122 Type 2 diabetes mellitus with diabetic chronic kidney disease: Secondary | ICD-10-CM | POA: Diagnosis not present

## 2017-12-14 DIAGNOSIS — I350 Nonrheumatic aortic (valve) stenosis: Secondary | ICD-10-CM | POA: Diagnosis not present

## 2017-12-14 DIAGNOSIS — I509 Heart failure, unspecified: Secondary | ICD-10-CM | POA: Diagnosis not present

## 2017-12-14 DIAGNOSIS — E785 Hyperlipidemia, unspecified: Secondary | ICD-10-CM | POA: Diagnosis not present

## 2017-12-14 DIAGNOSIS — R2689 Other abnormalities of gait and mobility: Secondary | ICD-10-CM | POA: Diagnosis not present

## 2017-12-14 DIAGNOSIS — H04129 Dry eye syndrome of unspecified lacrimal gland: Secondary | ICD-10-CM | POA: Diagnosis not present

## 2017-12-16 DIAGNOSIS — H04129 Dry eye syndrome of unspecified lacrimal gland: Secondary | ICD-10-CM | POA: Diagnosis not present

## 2017-12-16 DIAGNOSIS — I350 Nonrheumatic aortic (valve) stenosis: Secondary | ICD-10-CM | POA: Diagnosis not present

## 2017-12-16 DIAGNOSIS — R2689 Other abnormalities of gait and mobility: Secondary | ICD-10-CM | POA: Diagnosis not present

## 2017-12-16 DIAGNOSIS — I509 Heart failure, unspecified: Secondary | ICD-10-CM | POA: Diagnosis not present

## 2017-12-16 DIAGNOSIS — E785 Hyperlipidemia, unspecified: Secondary | ICD-10-CM | POA: Diagnosis not present

## 2017-12-16 DIAGNOSIS — E1122 Type 2 diabetes mellitus with diabetic chronic kidney disease: Secondary | ICD-10-CM | POA: Diagnosis not present

## 2017-12-21 DIAGNOSIS — R2689 Other abnormalities of gait and mobility: Secondary | ICD-10-CM | POA: Diagnosis not present

## 2017-12-21 DIAGNOSIS — I509 Heart failure, unspecified: Secondary | ICD-10-CM | POA: Diagnosis not present

## 2017-12-21 DIAGNOSIS — E1122 Type 2 diabetes mellitus with diabetic chronic kidney disease: Secondary | ICD-10-CM | POA: Diagnosis not present

## 2017-12-21 DIAGNOSIS — E785 Hyperlipidemia, unspecified: Secondary | ICD-10-CM | POA: Diagnosis not present

## 2017-12-21 DIAGNOSIS — H04129 Dry eye syndrome of unspecified lacrimal gland: Secondary | ICD-10-CM | POA: Diagnosis not present

## 2017-12-21 DIAGNOSIS — I350 Nonrheumatic aortic (valve) stenosis: Secondary | ICD-10-CM | POA: Diagnosis not present

## 2017-12-22 DIAGNOSIS — Z789 Other specified health status: Secondary | ICD-10-CM | POA: Diagnosis not present

## 2017-12-22 DIAGNOSIS — R350 Frequency of micturition: Secondary | ICD-10-CM | POA: Diagnosis not present

## 2017-12-23 DIAGNOSIS — I509 Heart failure, unspecified: Secondary | ICD-10-CM | POA: Diagnosis not present

## 2017-12-23 DIAGNOSIS — E785 Hyperlipidemia, unspecified: Secondary | ICD-10-CM | POA: Diagnosis not present

## 2017-12-23 DIAGNOSIS — R2689 Other abnormalities of gait and mobility: Secondary | ICD-10-CM | POA: Diagnosis not present

## 2017-12-23 DIAGNOSIS — E1122 Type 2 diabetes mellitus with diabetic chronic kidney disease: Secondary | ICD-10-CM | POA: Diagnosis not present

## 2017-12-23 DIAGNOSIS — I350 Nonrheumatic aortic (valve) stenosis: Secondary | ICD-10-CM | POA: Diagnosis not present

## 2017-12-23 DIAGNOSIS — N390 Urinary tract infection, site not specified: Secondary | ICD-10-CM | POA: Diagnosis not present

## 2017-12-23 DIAGNOSIS — H04129 Dry eye syndrome of unspecified lacrimal gland: Secondary | ICD-10-CM | POA: Diagnosis not present

## 2017-12-26 DIAGNOSIS — E1122 Type 2 diabetes mellitus with diabetic chronic kidney disease: Secondary | ICD-10-CM | POA: Diagnosis not present

## 2017-12-26 DIAGNOSIS — I509 Heart failure, unspecified: Secondary | ICD-10-CM | POA: Diagnosis not present

## 2017-12-26 DIAGNOSIS — H04129 Dry eye syndrome of unspecified lacrimal gland: Secondary | ICD-10-CM | POA: Diagnosis not present

## 2017-12-26 DIAGNOSIS — R2689 Other abnormalities of gait and mobility: Secondary | ICD-10-CM | POA: Diagnosis not present

## 2017-12-26 DIAGNOSIS — E785 Hyperlipidemia, unspecified: Secondary | ICD-10-CM | POA: Diagnosis not present

## 2017-12-26 DIAGNOSIS — I350 Nonrheumatic aortic (valve) stenosis: Secondary | ICD-10-CM | POA: Diagnosis not present

## 2017-12-29 DIAGNOSIS — H04129 Dry eye syndrome of unspecified lacrimal gland: Secondary | ICD-10-CM | POA: Diagnosis not present

## 2017-12-29 DIAGNOSIS — E785 Hyperlipidemia, unspecified: Secondary | ICD-10-CM | POA: Diagnosis not present

## 2017-12-29 DIAGNOSIS — R2689 Other abnormalities of gait and mobility: Secondary | ICD-10-CM | POA: Diagnosis not present

## 2017-12-29 DIAGNOSIS — I350 Nonrheumatic aortic (valve) stenosis: Secondary | ICD-10-CM | POA: Diagnosis not present

## 2017-12-29 DIAGNOSIS — I509 Heart failure, unspecified: Secondary | ICD-10-CM | POA: Diagnosis not present

## 2017-12-29 DIAGNOSIS — E1122 Type 2 diabetes mellitus with diabetic chronic kidney disease: Secondary | ICD-10-CM | POA: Diagnosis not present

## 2018-01-06 DIAGNOSIS — E785 Hyperlipidemia, unspecified: Secondary | ICD-10-CM | POA: Diagnosis not present

## 2018-01-06 DIAGNOSIS — E1122 Type 2 diabetes mellitus with diabetic chronic kidney disease: Secondary | ICD-10-CM | POA: Diagnosis not present

## 2018-01-06 DIAGNOSIS — R2689 Other abnormalities of gait and mobility: Secondary | ICD-10-CM | POA: Diagnosis not present

## 2018-01-06 DIAGNOSIS — I350 Nonrheumatic aortic (valve) stenosis: Secondary | ICD-10-CM | POA: Diagnosis not present

## 2018-01-06 DIAGNOSIS — I509 Heart failure, unspecified: Secondary | ICD-10-CM | POA: Diagnosis not present

## 2018-01-06 DIAGNOSIS — H04129 Dry eye syndrome of unspecified lacrimal gland: Secondary | ICD-10-CM | POA: Diagnosis not present

## 2018-01-08 DIAGNOSIS — I350 Nonrheumatic aortic (valve) stenosis: Secondary | ICD-10-CM | POA: Diagnosis not present

## 2018-01-08 DIAGNOSIS — H04129 Dry eye syndrome of unspecified lacrimal gland: Secondary | ICD-10-CM | POA: Diagnosis not present

## 2018-01-08 DIAGNOSIS — E785 Hyperlipidemia, unspecified: Secondary | ICD-10-CM | POA: Diagnosis not present

## 2018-01-08 DIAGNOSIS — E1122 Type 2 diabetes mellitus with diabetic chronic kidney disease: Secondary | ICD-10-CM | POA: Diagnosis not present

## 2018-01-08 DIAGNOSIS — I509 Heart failure, unspecified: Secondary | ICD-10-CM | POA: Diagnosis not present

## 2018-01-08 DIAGNOSIS — R2689 Other abnormalities of gait and mobility: Secondary | ICD-10-CM | POA: Diagnosis not present

## 2018-01-12 DIAGNOSIS — I509 Heart failure, unspecified: Secondary | ICD-10-CM | POA: Diagnosis not present

## 2018-01-12 DIAGNOSIS — E1122 Type 2 diabetes mellitus with diabetic chronic kidney disease: Secondary | ICD-10-CM | POA: Diagnosis not present

## 2018-01-12 DIAGNOSIS — H04129 Dry eye syndrome of unspecified lacrimal gland: Secondary | ICD-10-CM | POA: Diagnosis not present

## 2018-01-12 DIAGNOSIS — R2689 Other abnormalities of gait and mobility: Secondary | ICD-10-CM | POA: Diagnosis not present

## 2018-01-12 DIAGNOSIS — I350 Nonrheumatic aortic (valve) stenosis: Secondary | ICD-10-CM | POA: Diagnosis not present

## 2018-01-12 DIAGNOSIS — E785 Hyperlipidemia, unspecified: Secondary | ICD-10-CM | POA: Diagnosis not present

## 2018-07-10 ENCOUNTER — Emergency Department (HOSPITAL_COMMUNITY): Payer: Medicare Other

## 2018-07-10 ENCOUNTER — Other Ambulatory Visit: Payer: Self-pay

## 2018-07-10 ENCOUNTER — Encounter (HOSPITAL_COMMUNITY): Payer: Self-pay

## 2018-07-10 ENCOUNTER — Inpatient Hospital Stay (HOSPITAL_COMMUNITY)
Admission: EM | Admit: 2018-07-10 | Discharge: 2018-07-19 | DRG: 177 | Disposition: A | Discharge status: Skilled Nursing Facility | Payer: Medicare Other | Attending: Internal Medicine | Admitting: Internal Medicine

## 2018-07-10 DIAGNOSIS — M810 Age-related osteoporosis without current pathological fracture: Secondary | ICD-10-CM | POA: Diagnosis present

## 2018-07-10 DIAGNOSIS — N170 Acute kidney failure with tubular necrosis: Secondary | ICD-10-CM | POA: Diagnosis not present

## 2018-07-10 DIAGNOSIS — J988 Other specified respiratory disorders: Secondary | ICD-10-CM | POA: Diagnosis not present

## 2018-07-10 DIAGNOSIS — U071 COVID-19: Secondary | ICD-10-CM | POA: Diagnosis present

## 2018-07-10 DIAGNOSIS — F329 Major depressive disorder, single episode, unspecified: Secondary | ICD-10-CM | POA: Diagnosis present

## 2018-07-10 DIAGNOSIS — E1165 Type 2 diabetes mellitus with hyperglycemia: Secondary | ICD-10-CM | POA: Diagnosis present

## 2018-07-10 DIAGNOSIS — Z853 Personal history of malignant neoplasm of breast: Secondary | ICD-10-CM

## 2018-07-10 DIAGNOSIS — R296 Repeated falls: Secondary | ICD-10-CM | POA: Diagnosis present

## 2018-07-10 DIAGNOSIS — Z953 Presence of xenogenic heart valve: Secondary | ICD-10-CM

## 2018-07-10 DIAGNOSIS — Z794 Long term (current) use of insulin: Secondary | ICD-10-CM | POA: Diagnosis not present

## 2018-07-10 DIAGNOSIS — I5033 Acute on chronic diastolic (congestive) heart failure: Secondary | ICD-10-CM | POA: Diagnosis present

## 2018-07-10 DIAGNOSIS — E1122 Type 2 diabetes mellitus with diabetic chronic kidney disease: Secondary | ICD-10-CM | POA: Diagnosis present

## 2018-07-10 DIAGNOSIS — Z8249 Family history of ischemic heart disease and other diseases of the circulatory system: Secondary | ICD-10-CM

## 2018-07-10 DIAGNOSIS — Z9221 Personal history of antineoplastic chemotherapy: Secondary | ICD-10-CM

## 2018-07-10 DIAGNOSIS — N183 Chronic kidney disease, stage 3 (moderate): Secondary | ICD-10-CM | POA: Diagnosis present

## 2018-07-10 DIAGNOSIS — J1289 Other viral pneumonia: Secondary | ICD-10-CM | POA: Diagnosis present

## 2018-07-10 DIAGNOSIS — J9601 Acute respiratory failure with hypoxia: Secondary | ICD-10-CM | POA: Diagnosis present

## 2018-07-10 DIAGNOSIS — I421 Obstructive hypertrophic cardiomyopathy: Secondary | ICD-10-CM | POA: Diagnosis present

## 2018-07-10 DIAGNOSIS — Z9012 Acquired absence of left breast and nipple: Secondary | ICD-10-CM

## 2018-07-10 DIAGNOSIS — I10 Essential (primary) hypertension: Secondary | ICD-10-CM | POA: Diagnosis present

## 2018-07-10 DIAGNOSIS — R531 Weakness: Secondary | ICD-10-CM | POA: Diagnosis present

## 2018-07-10 DIAGNOSIS — I2510 Atherosclerotic heart disease of native coronary artery without angina pectoris: Secondary | ICD-10-CM | POA: Diagnosis present

## 2018-07-10 DIAGNOSIS — D631 Anemia in chronic kidney disease: Secondary | ICD-10-CM | POA: Diagnosis present

## 2018-07-10 DIAGNOSIS — IMO0002 Reserved for concepts with insufficient information to code with codable children: Secondary | ICD-10-CM

## 2018-07-10 DIAGNOSIS — N182 Chronic kidney disease, stage 2 (mild): Secondary | ICD-10-CM | POA: Diagnosis not present

## 2018-07-10 DIAGNOSIS — I5032 Chronic diastolic (congestive) heart failure: Secondary | ICD-10-CM | POA: Diagnosis present

## 2018-07-10 DIAGNOSIS — Z531 Procedure and treatment not carried out because of patient's decision for reasons of belief and group pressure: Secondary | ICD-10-CM | POA: Diagnosis present

## 2018-07-10 DIAGNOSIS — Z79899 Other long term (current) drug therapy: Secondary | ICD-10-CM

## 2018-07-10 DIAGNOSIS — Z951 Presence of aortocoronary bypass graft: Secondary | ICD-10-CM

## 2018-07-10 DIAGNOSIS — E1151 Type 2 diabetes mellitus with diabetic peripheral angiopathy without gangrene: Secondary | ICD-10-CM | POA: Diagnosis present

## 2018-07-10 DIAGNOSIS — E785 Hyperlipidemia, unspecified: Secondary | ICD-10-CM | POA: Diagnosis present

## 2018-07-10 DIAGNOSIS — I350 Nonrheumatic aortic (valve) stenosis: Secondary | ICD-10-CM

## 2018-07-10 DIAGNOSIS — Z8673 Personal history of transient ischemic attack (TIA), and cerebral infarction without residual deficits: Secondary | ICD-10-CM

## 2018-07-10 DIAGNOSIS — I130 Hypertensive heart and chronic kidney disease with heart failure and stage 1 through stage 4 chronic kidney disease, or unspecified chronic kidney disease: Secondary | ICD-10-CM | POA: Diagnosis present

## 2018-07-10 DIAGNOSIS — I739 Peripheral vascular disease, unspecified: Secondary | ICD-10-CM | POA: Diagnosis present

## 2018-07-10 DIAGNOSIS — E860 Dehydration: Secondary | ICD-10-CM | POA: Diagnosis present

## 2018-07-10 DIAGNOSIS — N179 Acute kidney failure, unspecified: Secondary | ICD-10-CM | POA: Diagnosis present

## 2018-07-10 DIAGNOSIS — Z7982 Long term (current) use of aspirin: Secondary | ICD-10-CM | POA: Diagnosis not present

## 2018-07-10 DIAGNOSIS — IMO0001 Reserved for inherently not codable concepts without codable children: Secondary | ICD-10-CM

## 2018-07-10 DIAGNOSIS — Z833 Family history of diabetes mellitus: Secondary | ICD-10-CM

## 2018-07-10 DIAGNOSIS — E7800 Pure hypercholesterolemia, unspecified: Secondary | ICD-10-CM | POA: Diagnosis not present

## 2018-07-10 DIAGNOSIS — Z818 Family history of other mental and behavioral disorders: Secondary | ICD-10-CM

## 2018-07-10 DIAGNOSIS — Z841 Family history of disorders of kidney and ureter: Secondary | ICD-10-CM

## 2018-07-10 DIAGNOSIS — N189 Chronic kidney disease, unspecified: Secondary | ICD-10-CM | POA: Diagnosis not present

## 2018-07-10 LAB — COMPREHENSIVE METABOLIC PANEL
ALT: 13 U/L (ref 0–44)
AST: 20 U/L (ref 15–41)
Albumin: 2.6 g/dL — ABNORMAL LOW (ref 3.5–5.0)
Alkaline Phosphatase: 69 U/L (ref 38–126)
Anion gap: 12 (ref 5–15)
BUN: 37 mg/dL — ABNORMAL HIGH (ref 8–23)
CO2: 19 mmol/L — ABNORMAL LOW (ref 22–32)
Calcium: 8.2 mg/dL — ABNORMAL LOW (ref 8.9–10.3)
Chloride: 106 mmol/L (ref 98–111)
Creatinine, Ser: 2.94 mg/dL — ABNORMAL HIGH (ref 0.44–1.00)
GFR calc Af Amer: 17 mL/min — ABNORMAL LOW (ref >60)
GFR calc non Af Amer: 15 mL/min — ABNORMAL LOW (ref >60)
Glucose, Bld: 69 mg/dL — ABNORMAL LOW (ref 70–99)
Potassium: 3.7 mmol/L (ref 3.5–5.1)
Sodium: 137 mmol/L (ref 135–145)
Total Bilirubin: 0.6 mg/dL (ref 0.3–1.2)
Total Protein: 5.9 g/dL — ABNORMAL LOW (ref 6.5–8.1)

## 2018-07-10 LAB — CBC WITH DIFFERENTIAL/PLATELET
Abs Immature Granulocytes: 0.05 10*3/uL (ref 0.00–0.07)
Basophils Absolute: 0 10*3/uL (ref 0.0–0.1)
Basophils Relative: 0 %
Eosinophils Absolute: 0.1 10*3/uL (ref 0.0–0.5)
Eosinophils Relative: 1 %
HCT: 33.6 % — ABNORMAL LOW (ref 36.0–46.0)
Hemoglobin: 10.8 g/dL — ABNORMAL LOW (ref 12.0–15.0)
Immature Granulocytes: 1 %
Lymphocytes Relative: 12 %
Lymphs Abs: 1 10*3/uL (ref 0.7–4.0)
MCH: 29.7 pg (ref 26.0–34.0)
MCHC: 32.1 g/dL (ref 30.0–36.0)
MCV: 92.3 fL (ref 80.0–100.0)
Monocytes Absolute: 0.5 10*3/uL (ref 0.1–1.0)
Monocytes Relative: 7 %
Neutro Abs: 6.1 10*3/uL (ref 1.7–7.7)
Neutrophils Relative %: 79 %
Platelets: 194 10*3/uL (ref 150–400)
RBC: 3.64 MIL/uL — ABNORMAL LOW (ref 3.87–5.11)
RDW: 13.2 % (ref 11.5–15.5)
WBC: 7.7 10*3/uL (ref 4.0–10.5)
nRBC: 0 % (ref 0.0–0.2)

## 2018-07-10 LAB — ABO/RH: ABO/RH(D): O POS

## 2018-07-10 LAB — PROCALCITONIN: Procalcitonin: 0.27 ng/mL

## 2018-07-10 LAB — SARS CORONAVIRUS 2 BY RT PCR (HOSPITAL ORDER, PERFORMED IN ~~LOC~~ HOSPITAL LAB): SARS Coronavirus 2: POSITIVE — AB

## 2018-07-10 LAB — LACTATE DEHYDROGENASE: LDH: 217 U/L — ABNORMAL HIGH (ref 98–192)

## 2018-07-10 LAB — GLUCOSE, CAPILLARY: Glucose-Capillary: 108 mg/dL — ABNORMAL HIGH (ref 70–99)

## 2018-07-10 LAB — TROPONIN I: Troponin I: 0.06 ng/mL (ref <0.03)

## 2018-07-10 LAB — CBG MONITORING, ED: Glucose-Capillary: 73 mg/dL (ref 70–99)

## 2018-07-10 LAB — D-DIMER, QUANTITATIVE: D-Dimer, Quant: 1.32 ug/mL-FEU — ABNORMAL HIGH (ref 0.00–0.50)

## 2018-07-10 LAB — LACTIC ACID, PLASMA: Lactic Acid, Venous: 0.8 mmol/L (ref 0.5–1.9)

## 2018-07-10 LAB — FERRITIN: Ferritin: 217 ng/mL (ref 11–307)

## 2018-07-10 MED ORDER — ATORVASTATIN CALCIUM 40 MG PO TABS
80.0000 mg | ORAL_TABLET | Freq: Every day | ORAL | Status: DC
  Administered 2018-07-11 – 2018-07-18 (×9): 80 mg via ORAL
  Filled 2018-07-10: qty 2
  Filled 2018-07-10: qty 1
  Filled 2018-07-10 (×8): qty 2

## 2018-07-10 MED ORDER — INSULIN GLARGINE 100 UNIT/ML ~~LOC~~ SOLN
10.0000 [IU] | Freq: Every day | SUBCUTANEOUS | Status: DC
  Administered 2018-07-11 – 2018-07-12 (×3): 10 [IU] via SUBCUTANEOUS
  Filled 2018-07-10 (×5): qty 0.1

## 2018-07-10 MED ORDER — ACETAMINOPHEN 325 MG PO TABS
650.0000 mg | ORAL_TABLET | Freq: Four times a day (QID) | ORAL | Status: DC | PRN
  Administered 2018-07-11 – 2018-07-15 (×3): 650 mg via ORAL
  Filled 2018-07-10 (×2): qty 2

## 2018-07-10 MED ORDER — PAROXETINE HCL 20 MG PO TABS
30.0000 mg | ORAL_TABLET | Freq: Every day | ORAL | Status: DC
  Administered 2018-07-10 – 2018-07-11 (×2): 30 mg via ORAL
  Filled 2018-07-10 (×3): qty 1

## 2018-07-10 MED ORDER — METOPROLOL SUCCINATE ER 25 MG PO TB24
25.0000 mg | ORAL_TABLET | Freq: Every day | ORAL | Status: DC
  Administered 2018-07-10 – 2018-07-19 (×10): 25 mg via ORAL
  Filled 2018-07-10 (×11): qty 1

## 2018-07-10 MED ORDER — SENNOSIDES-DOCUSATE SODIUM 8.6-50 MG PO TABS
1.0000 | ORAL_TABLET | Freq: Every day | ORAL | Status: DC
  Administered 2018-07-12 – 2018-07-18 (×8): 1 via ORAL
  Filled 2018-07-10 (×8): qty 1

## 2018-07-10 MED ORDER — ASPIRIN EC 81 MG PO TBEC
81.0000 mg | DELAYED_RELEASE_TABLET | Freq: Every day | ORAL | Status: DC
  Administered 2018-07-10 – 2018-07-19 (×10): 81 mg via ORAL
  Filled 2018-07-10 (×11): qty 1

## 2018-07-10 MED ORDER — SODIUM CHLORIDE 0.9 % IV BOLUS
500.0000 mL | Freq: Once | INTRAVENOUS | Status: AC
  Administered 2018-07-10: 500 mL via INTRAVENOUS

## 2018-07-10 MED ORDER — HEPARIN SODIUM (PORCINE) 5000 UNIT/ML IJ SOLN
5000.0000 [IU] | Freq: Three times a day (TID) | INTRAMUSCULAR | Status: DC
  Administered 2018-07-11 (×2): 5000 [IU] via SUBCUTANEOUS
  Filled 2018-07-10 (×2): qty 1

## 2018-07-10 MED ORDER — SODIUM CHLORIDE 0.9% FLUSH
3.0000 mL | Freq: Two times a day (BID) | INTRAVENOUS | Status: DC
  Administered 2018-07-10 – 2018-07-19 (×19): 3 mL via INTRAVENOUS

## 2018-07-10 MED ORDER — LACTATED RINGERS IV SOLN
INTRAVENOUS | Status: AC
  Administered 2018-07-10: 19:00:00 via INTRAVENOUS

## 2018-07-10 MED ORDER — INSULIN ASPART 100 UNIT/ML ~~LOC~~ SOLN
0.0000 [IU] | Freq: Three times a day (TID) | SUBCUTANEOUS | Status: DC
  Administered 2018-07-15 – 2018-07-19 (×6): 1 [IU] via SUBCUTANEOUS

## 2018-07-10 NOTE — H&P (Addendum)
Internal Medicine Teaching Service  Attending History and Physical   Date: 07/10/2018               Patient Name:  [name] MRN: [id number]  DOB: Dec 25, 1945 Age / Sex: 73 y.o., female   PCP: Dr. Lynnae [name]         Medical Service: Internal Medicine Teaching Service         Attending Physician: Dr. Evette [name], [name], *    First Contact: Dr. Sharon [name] Pager: [phone]  Second Contact: Dr. Frederico [name] Pager: [phone]       After Hours (After 5p/  First Contact Pager: [phone]  weekends / holidays): Second Contact Pager: [phone]   Chief Complaint: Weakness  History of Present Illness:   73 year old person living with heart failure with preserved ejection fraction, insulin-dependent diabetes, and CKD stage IIIb, presented from her assisted living facility with 6 days of fever, myalgias, and fatigue.  Patient endorses feeling short of breath, though she does not have very much cough.  She says the physician at the assisted living facility did a chest x-ray earlier in the week and said that she had a pneumonia.  They have tried treating this with levofloxacin.  At baseline her daughter [name] reports that the patient is fairly functional.  She moved to the assisted living facility about 1 year ago because of recurrent falls at home.  The time she was living by herself.  At baseline she has an appropriate memory, conversational, ambulatory, and independent in all her activities of daily living.  They deny any recent travel.  Denies any recent changes in her health, no recent hospitalizations, no recent changes in her medications.  It is noted in the chart that there has been several cases of COVID-19 at her assisted living facility recently.    Meds: Current Meds  Medication Sig  . acetaminophen (TYLENOL) 325 MG tablet Take 650 mg by mouth 4 (four) times daily as needed for mild pain or headache.   Marland Kitchen amLODipine (NORVASC) 10 MG tablet Take 1 tablet (10 mg total) by mouth daily.  Marland Kitchen aspirin 81  MG EC tablet TAKE 1 TABLET (81 MG TOTAL) BY MOUTH DAILY. SWALLOW WHOLE. (Patient taking differently: Take 81 mg by mouth daily. TAKE 1 TABLET (81 MG TOTAL) BY MOUTH DAILY. SWALLOW WHOLE.)  . atorvastatin (LIPITOR) 80 MG tablet Take 1 tablet (80 mg total) by mouth daily at 6 PM. (Patient taking differently: Take 80 mg by mouth at bedtime. )  . docusate sodium (COLACE) 100 MG capsule Take 200 mg by mouth daily as needed for mild constipation.   . Emollient (EUCERIN) lotion Apply topically 2 (two) times daily.  . furosemide (LASIX) 40 MG tablet Take 1 tablet (40 mg total) by mouth 2 (two) times daily.  Marland Kitchen guaiFENesin (MUCINEX) 600 MG 12 hr tablet Take 600 mg by mouth 2 (two) times daily.  Marland Kitchen guaiFENesin (ROBITUSSIN) 100 MG/5ML SOLN Take 5 mLs (100 mg total) by mouth every 4 (four) hours as needed for cough or to loosen phlegm.  . hydrALAZINE (APRESOLINE) 50 MG tablet Take 1 tablet (50 mg total) by mouth 2 (two) times daily.  . Insulin Detemir (LEVEMIR FLEXTOUCH) 100 UNIT/ML Pen Inject 12 Units into the skin at bedtime. Do not give insulin for blood sugar less then 150.  . levofloxacin (LEVAQUIN) 500 MG tablet Take 500 mg by mouth daily.  . Loratadine 10 MG CAPS Take 1 capsule (10 mg total) by mouth daily. (Patient taking differently:  Take 10 mg by mouth every morning. )  . metFORMIN (GLUCOPHAGE) 1000 MG tablet Take 1 tablet (1,000 mg total) by mouth 2 (two) times daily with a meal.  . metoprolol succinate (TOPROL-XL) 25 MG 24 hr tablet Take 1 tablet (25 mg total) by mouth daily. (Patient taking differently: Take 25 mg by mouth every morning. )  . naproxen sodium (ALEVE) 220 MG tablet Take 220 mg by mouth daily as needed (for pain).  Marland Kitchen PARoxetine (PAXIL) 30 MG tablet Take 1 tablet (30 mg total) by mouth daily.  . polyethylene glycol (MIRALAX / GLYCOLAX) packet Take 17 g by mouth daily. (Patient taking differently: Take 17 g by mouth daily as needed for mild constipation. )  . saccharomyces boulardii  (FLORASTOR) 250 MG capsule Take 250 mg by mouth 2 (two) times daily.  . sennosides-docusate sodium (SENOKOT-S) 8.6-50 MG tablet Take 1 tablet by mouth at bedtime.  Marland Kitchen tetrahydrozoline 0.05 % ophthalmic solution Place 1 drop into both eyes as needed (dry eyes).     Allergies: Allergies as of 07/10/2018 - Review Complete 07/10/2018  Allergen Reaction Noted  . Other Other (See Comments) 07/21/2017   Past Medical History:  Diagnosis Date  . Adenocarcinoma of breast ([place]) 1996   Completed tamoxifen and had mastectomy.  . Aortic stenosis, severe    s/p aortic valve replacement with porcine valve 06/2004.  ECHO 2010 EF 67%, LVH, diastolic dysfxn, Bioprostetic aoritc valve, mild AS. ECHO 2013 EF 60%, Nl aortic artificial valve, dynamic obstruction in the outflow tract   Class IIb rec for annual TTE after 5 yrs. She had a TTE 2013    . Arthritis   . CAD (coronary artery disease) 2006   s/p CABG (5/06) w/ saphenous vein to RCA at time of AVR  . CVA (cerebral infarction) 2006   Post-op from AVR. Presumed embolic in nature. Carotid stenosis of R 60-79%. Repeat dopplers 4/10 no R stenosis and L stenosis of 1-29%.  . Depression    Controlled on Paxil  . Diabetes mellitus 1992   Dx 04/25/1990. Now insulin dependent, started 2008. On ACEI.   . Diverticulosis 2001  . Hyperlipidemia    Mgmt with a statin  . Hypertension    Requires 4 drug tx  . Osteoporosis 2006   DEXA 10/06 : L femur T -2.8, R -2.7. Lumbar T -2.4. On bisphosphonates and  Calcium / Vit D.  . Refusal of blood transfusions as patient is Jehovah's Witness     Family History: No significant family history elicited from [place] her daughter  Social History: Patient lives at the [facility] assisted living facility in [place].  She has 8 daughters, several live close in [place].  Her daughter [name] is a Psychologist, counselling on [address].  Review of Systems: A complete ROS was negative except as per HPI.   Physical Exam: Blood  pressure 108/72, pulse 85, temperature 99.5 F (37.5 C), temperature source Oral, resp. rate 15, height $RemoveBe'5\' 6"'vQCGZojqI$  (1.676 m), weight 76.2 kg, SpO2 98 %.  General: Weak and ill-appearing woman lying in bed. Eyes: Extraocular motions intact, normal racial melanosis of the sclera Neck: Thin, no JVD, no thyromegaly, no adenopathy Heart: 3 out of 6 early systolic murmur heard throughout the precordium, no rubs or gallops, well-healed sternotomy scar Lungs: Normal work of breathing, crackles at the left lower base, no wheezing Abdomen: Mildly distended, tympanic, nontender, no rebound or guarding Extremities: Warm and well-perfused, no lower extremity edema Neuro: Alert, oriented, conversational, memory  difficulties with simple questions, full strength in the upper and lower extremities.   EKG: personally reviewed my interpretation is normal sinus rhythm, 79 bpm, left bundle branch block, LVH, no acute ischemic changes  CXR: personally reviewed my interpretation is no focal opacities  Assessment & Plan by Problem:  Principal Problem:   COVID-19 Active Problems:   Acute on chronic renal failure (HCC)   HOCM (hypertrophic obstructive cardiomyopathy) ([place])   COVID-19: Patient admitted with symptoms consistent with COVID-19.  Rapid SARS-CoV-2 test positive.  Currently requiring 2 L nasal cannula and saturating very well.  Respiratory status appears reassuring.  She does have several underlying medical conditions that put her at risk for a bad outcome including heart disease, diabetes, hypertension.  Will treat with supportive care, admit to telemetry floor, [facility] currently unable to accept the patient due to high census.  Stop levofloxacin as seems low risk for bacterial pneumonia.  Heparin for VTE prophylaxis.  Acute on chronic kidney disease: Baseline creatinine around 1.3 and now up to 2.9.  Likely due to dehydration in the setting of acute illness.  Plan to hold Lasix.  Will hydrate  with IV LR at 75 mL/h for the next 24 hours.  Received a 500 mL bolus in the ED.  Recheck labs in the morning.  Chronic heart failure with preserved ejection fraction: Noted to be probable hypertrophic obstructive cardiomyopathy on echo in 2013.  We will continue with her beta-blocker.  We are hydrating her today with IV fluids given that she appears hypovolemic.  Continue with metoprolol succinate 25 mg daily.  Hold Lasix for now.  Ischemic heart disease noted in her chart, will continue with aspirin and atorvastatin.  Diabetes: Historically well controlled.  Last A1c 6.2% over 1 year ago.  Repeat A1c in the morning.  Continue with long-acting insulin 10 units daily.  Sensitive sliding scale insulin.  Hold metformin during acute illness and renal failure.  Regular diet, encourage oral nutrition as much as able  3 times daily heparin for DVT prophylaxis  CODE STATUS: Full code confirmed with her daughter and HPOA, Hyman Hopes [phone]).  Daughter informs me the patient is a Jehovah witness and would not accept blood products.  Dispo: Admit patient to Inpatient with expected length of stay greater than 2 midnights.  Signed: Axel Filler, MD 07/10/2018, 3:13 PM  Pager: [phone]

## 2018-07-10 NOTE — ED Notes (Signed)
Attempted to obtain urine specimen; Pt unable to provide one at this time 

## 2018-07-10 NOTE — ED Notes (Signed)
EDP notified of elevated trop

## 2018-07-10 NOTE — ED Provider Notes (Addendum)
[facility] EMERGENCY DEPARTMENT Provider Note   CSN: [id number] Arrival date & time: 07/10/18  7001    History   Chief Complaint Chief Complaint  Patient presents with  . Fever    HPI [name] is a 73 y.o. female who presents with fever and weakness. PMH significant for insulin dependent diabetes, hypertension, hyperlipidemia, coronary artery disease, CHF, aortic stenosis s/p valve replacement, hx of breast cancer s/p mastectomy, hx of stroke without residual weakness. The patient is a poor historian. She says she feels "weak" and is unable to contribute much more to her history. She is a resident at [facility]. Apparently she's not been well for a couple of days. A CXR was done at the facility and it showed a RLL pneumonia and she was started on Levaquin. Discussed with her daughter [name]. She states she's been complaining of a headache for about 10 days. She's noticed she hasn't had much of an appetite and has had a "little" cough. She talked to her today and noted she was very weak and slow to respond. Typically she can do basic ADLs on her own and is ambulatory at baseline. She was tested for COVID-19 but the results have not resulted yet. O2 sats were 90% with EMS and she was placed on oxygen.  LEVEL 5 CAVEAT due to AMS/lethargy  HPI  Past Medical History:  Diagnosis Date  . Adenocarcinoma of breast ([place]) 1996   Completed tamoxifen and had mastectomy.  . Aortic stenosis, severe    s/p aortic valve replacement with porcine valve 06/2004.  ECHO 2010 EF 74%, LVH, diastolic dysfxn, Bioprostetic aoritc valve, mild AS. ECHO 2013 EF 60%, Nl aortic artificial valve, dynamic obstruction in the outflow tract   Class IIb rec for annual TTE after 5 yrs. She had a TTE 2013    . Arthritis   . CAD (coronary artery disease) 2006   s/p CABG (5/06) w/ saphenous vein to RCA at time of AVR  . CVA (cerebral infarction) 2006   Post-op from AVR. Presumed embolic in  nature. Carotid stenosis of R 60-79%. Repeat dopplers 4/10 no R stenosis and L stenosis of 1-29%.  . Depression    Controlled on Paxil  . Diabetes mellitus 1992   Dx 04/25/1990. Now insulin dependent, started 2008. On ACEI.   . Diverticulosis 2001  . Hyperlipidemia    Mgmt with a statin  . Hypertension    Requires 4 drug tx  . Osteoporosis 2006   DEXA 10/06 : L femur T -2.8, R -2.7. Lumbar T -2.4. On bisphosphonates and  Calcium / Vit D.  . Refusal of blood transfusions as patient is Jehovah's Witness     Patient Active Problem List   Diagnosis Date Noted  . AKI (acute kidney injury) ([place]) 07/22/2017  . Hypothermia 07/22/2017  . Hypoglycemia 07/21/2017  . Chronic diastolic CHF (congestive heart failure) ([place]) 06/22/2017  . Peripheral arterial disease ([place]) 06/18/2017  . B12 deficiency 05/01/2017  . Bilateral lower extremity edema 11/08/2015  . Aortic atherosclerosis ([place]) 06/07/2014  . Abnormality of gait 05/29/2012  . HOCM (hypertrophic obstructive cardiomyopathy) ([place]) 06/11/2011  . CKD (chronic kidney disease) stage 2, GFR 60-89 ml/min 06/03/2011  . Routine health maintenance 06/10/2010  . Hyperlipidemia   . Refusal of blood transfusions as patient is Jehovah's Witness   . History of adenocarcinoma of breast   . Osteoporosis   . Status post CVA   . Aortic stenosis s/p Tissure AVR 2006   .  CAD (coronary artery disease)   . Hypertension   . Depression 01/23/2006  . Diabetes mellitus with renal manifestations, controlled ([place]) 03/25/1990    Past Surgical History:  Procedure Laterality Date  . ABDOMINAL HYSTERECTOMY  1987   for fibroids  . AORTIC VALVE REPLACEMENT  2006  . CHOLECYSTECTOMY    . CORONARY ARTERY BYPASS GRAFT  2006   Saphenous vein to RCA at time of AVR. Course complicated by acute respiratory failure, post-op PTX, ARI, ileus, CVA  . MASTECTOMY Left 1995   L for adenocarcinoma     OB History   No obstetric history on file.      Home Medications     Prior to Admission medications   Medication Sig Start Date End Date Taking? Authorizing Provider  ACCU-CHEK FASTCLIX LANCETS MISC Use to check blood sugar up to 3 times a day 04/01/17   [name], MD  acetaminophen (TYLENOL) 325 MG tablet Take 650 mg by mouth 4 (four) times daily as needed for mild pain or headache.     [provider]  amLODipine (NORVASC) 10 MG tablet Take 1 tablet (10 mg total) by mouth daily. 06/26/17   Thomasene Ripple, MD  aspirin 81 MG EC tablet TAKE 1 TABLET (81 MG TOTAL) BY MOUTH DAILY. SWALLOW WHOLE. 03/31/17   [name], MD  atorvastatin (LIPITOR) 80 MG tablet Take 1 tablet (80 mg total) by mouth daily at 6 PM. Patient taking differently: Take 80 mg by mouth at bedtime.  06/26/17   Thomasene Ripple, MD  docusate sodium (COLACE) 100 MG capsule Take 200 mg by mouth daily as needed for mild constipation.     [provider]  Emollient (EUCERIN) lotion Apply topically 2 (two) times daily.    [provider]  furosemide (LASIX) 40 MG tablet Take 1 tablet (40 mg total) by mouth 2 (two) times daily. Patient not taking: Reported on 07/21/2017 06/26/17   Thomasene Ripple, MD  glucose blood (ACCU-CHEK GUIDE) test strip Check blood sugar 3 times a day as instructed 04/01/17   [name], MD  guaiFENesin (ROBITUSSIN) 100 MG/5ML SOLN Take 5 mLs (100 mg total) by mouth every 4 (four) hours as needed for cough or to loosen phlegm. 07/23/17   Lorella Nimrod, MD  hydrALAZINE (APRESOLINE) 50 MG tablet Take 1 tablet (50 mg total) by mouth 2 (two) times daily. 06/26/17   Thomasene Ripple, MD  Insulin Detemir (LEVEMIR FLEXTOUCH) 100 UNIT/ML Pen Inject 12 Units into the skin at bedtime. Do not give insulin for blood sugar less then 150. 07/23/17   Lorella Nimrod, MD  Insulin Pen Needle (PEN NEEDLES 3/16") 31G X 5 MM MISC 1 each by Does not apply route 4 (four) times daily. Use to check blood sugars 4 times a day. Dx code E11.22 03/31/17   [name], MD  Loratadine 10 MG CAPS Take 1 capsule (10 mg total) by mouth daily. Patient not taking: Reported on 07/21/2017 06/07/14   [name], MD  metFORMIN (GLUCOPHAGE) 1000 MG tablet Take 1 tablet (1,000 mg total) by mouth 2 (two) times daily with a meal. 03/31/17   [name], MD  metoprolol succinate (TOPROL-XL) 25 MG 24 hr tablet Take 1 tablet (25 mg total) by mouth daily. Patient not taking: Reported on 07/21/2017 03/31/17   [name], MD  PARoxetine (PAXIL) 30 MG tablet Take 1 tablet (30 mg total) by mouth daily. 07/23/17   Lorella Nimrod, MD  polyethylene glycol (  MIRALAX / GLYCOLAX) packet Take 17 g by mouth daily. 06/18/17   [name], MD  tetrahydrozoline 0.05 % ophthalmic solution Place 1 drop into both eyes as needed (dry eyes).    [provider]    Family History Family History  Problem Relation Age of Onset  . Diabetes Mother   . Hypertension Mother   . Alzheimer's disease Mother   . Heart disease Father 53       AMI at age 65 and 84  . Mental illness Sister   . Heart disease Sister 45       AMI  . Kidney disease Sister     Social History Social History   Tobacco Use  . Smoking status: Never Smoker  . Smokeless tobacco: Never Used  Substance Use Topics  . Alcohol use: Yes    Alcohol/week: 0.0 standard drinks    Comment: Occasional beer, monthly  . Drug use: No     Allergies   Other   Review of Systems Review of Systems  Unable to perform ROS: Mental status change  Constitutional: Positive for activity change, appetite change, fatigue and fever.  Respiratory: Positive for cough.   Neurological: Positive for headaches.     Physical Exam Updated Vital Signs BP 105/66   Pulse 80   Temp (!) 100.4 F (38 C) (Rectal)   Resp 17   Ht $R'5\' 6"'Ov$  (1.676 m)   Wt 76.2 kg   SpO2 98%   BMI 27.12 kg/m   Physical Exam Vitals signs and nursing note reviewed.  Constitutional:      General: She is not in acute  distress.    Appearance: She is well-developed. She is not ill-appearing.     Comments: Elderly female in NAD on 2L via [place]. Slow to respond. Seems mildly confused  HENT:     Head: Normocephalic and atraumatic.  Eyes:     General: No scleral icterus.       Right eye: No discharge.        Left eye: No discharge.     Conjunctiva/sclera: Conjunctivae normal.     Pupils: Pupils are equal, round, and reactive to light.  Neck:     Musculoskeletal: Normal range of motion.  Cardiovascular:     Rate and Rhythm: Normal rate and regular rhythm.     Heart sounds: Murmur (in RUSB) present.  Pulmonary:     Effort: Pulmonary effort is normal. No respiratory distress.     Breath sounds: Normal breath sounds.  Abdominal:     General: There is no distension.     Palpations: Abdomen is soft.     Tenderness: There is no abdominal tenderness.  Skin:    General: Skin is warm and dry.  Neurological:     Mental Status: She is alert and oriented to person, place, and time.  Psychiatric:        Behavior: Behavior normal.      ED Treatments / Results  Labs (all labs ordered are listed, but only abnormal results are displayed) Labs Reviewed  SARS CORONAVIRUS 2 (HOSPITAL ORDER, [facility]) - Abnormal; Notable for the following components:      Result Value   SARS Coronavirus 2 POSITIVE (*)    All other components within normal limits  COMPREHENSIVE METABOLIC PANEL - Abnormal; Notable for the following components:   CO2 19 (*)    Glucose, Bld 69 (*)    BUN 37 (*)    Creatinine,  Ser 2.94 (*)    Calcium 8.2 (*)    Total Protein 5.9 (*)    Albumin 2.6 (*)    GFR calc non Af Amer 15 (*)    GFR calc Af Amer 17 (*)    All other components within normal limits  CBC WITH DIFFERENTIAL/PLATELET - Abnormal; Notable for the following components:   RBC 3.64 (*)    Hemoglobin 10.8 (*)    HCT 33.6 (*)    All other components within normal limits  TROPONIN I - Abnormal; Notable  for the following components:   Troponin I 0.06 (*)    All other components within normal limits  CULTURE, BLOOD (ROUTINE X 2)  CULTURE, BLOOD (ROUTINE X 2)  LACTIC ACID, PLASMA  URINALYSIS, ROUTINE W REFLEX MICROSCOPIC    EKG EKG Interpretation  Date/Time:  Saturday Jul 10 2018 09:58:42 EDT Ventricular Rate:  79 PR Interval:    QRS Duration: 152 QT Interval:  447 QTC Calculation: 513 R Axis:   78 Text Interpretation:  Sinus rhythm Ventricular premature complex Prolonged PR interval Probable left atrial enlargement Left bundle branch block Baseline wander in lead(s) V1 When compared to prior, more PVC present/.  No STEMI Confirmed by Antony Blackbird [phone]) on 07/10/2018 10:09:48 AM   Radiology Dg Chest Port 1 View  Result Date: 07/10/2018 CLINICAL DATA:  Fever, hypoxia EXAM: PORTABLE CHEST 1 VIEW COMPARISON:  07/21/2016 FINDINGS: Lungs are essentially clear. No focal consolidation or frank interstitial edema. No pleural effusion or pneumothorax. Mild cardiomegaly.  Prosthetic aortic valve. Surgical clips in the left axilla. Median sternotomy. IMPRESSION: No evidence of acute cardiopulmonary disease. Electronically Signed   By: Julian Hy M.D.   On: 07/10/2018 11:18    Procedures Procedures (including critical care time)  Medications Ordered in ED Medications  sodium chloride 0.9 % bolus 500 mL (0 mLs Intravenous Stopped 07/10/18 1254)     Initial Impression / Assessment and Plan / ED Course  I have reviewed the triage vital signs and the nursing notes.  Pertinent labs & imaging results that were available during my care of the patient were reviewed by me and considered in my medical decision making (see chart for details).  73 year old female presents with generalized weakness/AMS and fever. Rectal temp is 100.4 here. O2 sat is low at 90% on RA and she was placed on O2. BP is soft as well. HR is normal. Exam is remarkable for generalized weakness and mild confusion.  Will obtain labs, UA, repeat CXR, obtain CT head, and obtain COVID-19 test. 500cc IVF bolus given.   CBC is remarkable for anemia which is around her baseline. CMP is remarkable for low bicarb (19), AKI (BUN is 37 and SCr is 2.94), low Ca (8.2), low protein (5.9). EKG is SR with PVCs. CXR is negative. Trop is 0.06 which is likely from demand ischemia and is similar to prior values. COVID test is positive. Discussed with IM resident. The [facility] does not have any non-ICU beds and thus will admit to their service.    Final Clinical Impressions(s) / ED Diagnoses   Final diagnoses:  COVID-19  AKI (acute kidney injury) [facility])    ED Discharge Orders    None           Recardo Evangelist, PA-C 07/10/18 1546    Tegeler, Gwenyth Allegra, MD 07/10/18 [phone]

## 2018-07-10 NOTE — ED Notes (Signed)
Patient transported to CT 

## 2018-07-10 NOTE — ED Notes (Signed)
Attempted report 

## 2018-07-10 NOTE — ED Notes (Signed)
Bonnita Nasuti (daughter) [phone]

## 2018-07-10 NOTE — ED Notes (Signed)
Updated pt's daughter ([name]) on pt's placement and plan of care

## 2018-07-10 NOTE — ED Notes (Signed)
ED TO INPATIENT HANDOFF REPORT  ED Nurse Name and Phone #: Talmage Nap  S Name/Age/Gender [name] 73 y.o. female Room/Bed: 023C/023C  Code Status   Code Status: Full Code  Home/SNF/Other Skilled nursing facility Patient oriented to: self, place, time and situation Is this baseline? Yes   Triage Complete: Triage complete  Chief Complaint Fever  Triage Note Pt from [facility] home vias ems; called out for generalized weakness, lethargy, fever, body aches x 2 days; 99.53F tympanic; chest x ray done 3 days ago, pneumonia present in lower R lobe; treating with Levaquin; denies cough, sob; 90% RA, 97% on 2 L; positive Covid pts at facility, pt tested several days ago, still waiting on results; A&O x 4 with ems, sluggish to respond  97.9 temporal 104/54 HR 979 CBG 86 RR 16   Allergies Allergies  Allergen Reactions  . 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," as the patient is a Jehovah's Witness    Level of Care/Admitting Diagnosis ED Disposition    ED Disposition Condition [place]: [place] [[id number]]  Level of Care: Telemetry Medical [867]  Covid Evaluation: Confirmed COVID Positive  Isolation Risk Level: Low Risk/Droplet (Less than 4L [place] supplementation)  Diagnosis: COVID-19 [[id number]]  Admitting Physician: Axel Filler [[id number]]  Attending Physician: Axel Filler [[id number]]  Estimated length of stay: 3 - 4 days  Certification:: I certify this patient will need inpatient services for at least 2 midnights  PT Class (Do Not Modify): Inpatient [101]  PT Acc Code (Do Not Modify): Private [1]       B Medical/Surgery History Past Medical History:  Diagnosis Date  . Adenocarcinoma of breast ([place]) 1996   Completed tamoxifen and had mastectomy.  . Aortic stenosis, severe    s/p aortic valve replacement with porcine valve 06/2004.  ECHO 2010 EF 03%, LVH, diastolic dysfxn,  Bioprostetic aoritc valve, mild AS. ECHO 2013 EF 60%, Nl aortic artificial valve, dynamic obstruction in the outflow tract   Class IIb rec for annual TTE after 5 yrs. She had a TTE 2013    . Arthritis   . CAD (coronary artery disease) 2006   s/p CABG (5/06) w/ saphenous vein to RCA at time of AVR  . CVA (cerebral infarction) 2006   Post-op from AVR. Presumed embolic in nature. Carotid stenosis of R 60-79%. Repeat dopplers 4/10 no R stenosis and L stenosis of 1-29%.  . Depression    Controlled on Paxil  . Diabetes mellitus 1992   Dx 04/25/1990. Now insulin dependent, started 2008. On ACEI.   . Diverticulosis 2001  . Hyperlipidemia    Mgmt with a statin  . Hypertension    Requires 4 drug tx  . Osteoporosis 2006   DEXA 10/06 : L femur T -2.8, R -2.7. Lumbar T -2.4. On bisphosphonates and  Calcium / Vit D.  . Refusal of blood transfusions as patient is Jehovah's Witness    Past Surgical History:  Procedure Laterality Date  . ABDOMINAL HYSTERECTOMY  1987   for fibroids  . AORTIC VALVE REPLACEMENT  2006  . CHOLECYSTECTOMY    . CORONARY ARTERY BYPASS GRAFT  2006   Saphenous vein to RCA at time of AVR. Course complicated by acute respiratory failure, post-op PTX, ARI, ileus, CVA  . MASTECTOMY Left 1995   L for adenocarcinoma     A IV Location/Drains/Wounds Patient Lines/Drains/Airways Status   Active Line/Drains/Airways  Name:   Placement date:   Placement time:   Site:   Days:   Peripheral IV 07/10/18 Right Forearm   07/10/18    1033    Forearm   less than 1   External Urinary Catheter   07/10/18    1019    -   less than 1          Intake/Output Last 24 hours  Intake/Output Summary (Last 24 hours) at 07/10/2018 1452 Last data filed at 07/10/2018 1254 Gross per 24 hour  Intake 503.22 ml  Output -  Net 503.22 ml    Labs/Imaging Results for orders placed or performed during the hospital encounter of 07/10/18 (from the past 48 hour(s))  Lactic acid, plasma     Status: None    Collection Time: 07/10/18 10:25 AM  Result Value Ref Range   Lactic Acid, Venous 0.8 0.5 - 1.9 mmol/L    Comment: Performed at [facility], [address], [place], [place] [address]  Comprehensive metabolic panel     Status: Abnormal   Collection Time: 07/10/18 10:25 AM  Result Value Ref Range   Sodium 137 135 - 145 mmol/L   Potassium 3.7 3.5 - 5.1 mmol/L   Chloride 106 98 - 111 mmol/L   CO2 19 (L) 22 - 32 mmol/L   Glucose, Bld 69 (L) 70 - 99 mg/dL   BUN 37 (H) 8 - 23 mg/dL   Creatinine, Ser 2.94 (H) 0.44 - 1.00 mg/dL   Calcium 8.2 (L) 8.9 - 10.3 mg/dL   Total Protein 5.9 (L) 6.5 - 8.1 g/dL   Albumin 2.6 (L) 3.5 - 5.0 g/dL   AST 20 15 - 41 U/L   ALT 13 0 - 44 U/L   Alkaline Phosphatase 69 38 - 126 U/L   Total Bilirubin 0.6 0.3 - 1.2 mg/dL   GFR calc non Af Amer 15 (L) >60 mL/min   GFR calc Af Amer 17 (L) >60 mL/min   Anion gap 12 5 - 15    Comment: Performed at [facility], [place] [address] [place], [place] [address]  CBC WITH DIFFERENTIAL     Status: Abnormal   Collection Time: 07/10/18 10:25 AM  Result Value Ref Range   WBC 7.7 4.0 - 10.5 K/uL   RBC 3.64 (L) 3.87 - 5.11 MIL/uL   Hemoglobin 10.8 (L) 12.0 - 15.0 g/dL   HCT 33.6 (L) 36.0 - 46.0 %   MCV 92.3 80.0 - 100.0 fL   MCH 29.7 26.0 - 34.0 pg   MCHC 32.1 30.0 - 36.0 g/dL   RDW 13.2 11.5 - 15.5 %   Platelets 194 150 - 400 K/uL   nRBC 0.0 0.0 - 0.2 %   Neutrophils Relative % 79 %   Neutro Abs 6.1 1.7 - 7.7 K/uL   Lymphocytes Relative 12 %   Lymphs Abs 1.0 0.7 - 4.0 K/uL   Monocytes Relative 7 %   Monocytes Absolute 0.5 0.1 - 1.0 K/uL   Eosinophils Relative 1 %   Eosinophils Absolute 0.1 0.0 - 0.5 K/uL   Basophils Relative 0 %   Basophils Absolute 0.0 0.0 - 0.1 K/uL   Immature Granulocytes 1 %   Abs Immature Granulocytes 0.05 0.00 - 0.07 K/uL    Comment: Performed at [facility], [address] [place], [place] [address]  Troponin I - ONCE - STAT     Status: Abnormal   Collection Time:  07/10/18 10:25 AM  Result Value Ref Range   Troponin I 0.06 (HH) <0.03 ng/mL    Comment: CRITICAL RESULT CALLED TO, READ BACK BY AND VERIFIED WITH: H,Hanford Lust,RN @ [phone] 07/10/2018 [place] Performed at [facility], [place] [address] [place], [place] [address]   SARS Coronavirus 2 (CEPHEID- Performed in [place] hospital lab), Hosp Order     Status: Abnormal   Collection Time: 07/10/18 10:30 AM  Result Value Ref Range   SARS Coronavirus 2 POSITIVE (A) NEGATIVE    Comment: CRITICAL RESULT CALLED TO, READ BACK BY AND VERIFIED WITH: RN CONNIE V. [phone] [id number] (NOTE) If result is NEGATIVE SARS-CoV-2 target nucleic acids are NOT DETECTED. The SARS-CoV-2 RNA is generally detectable in upper and lower  respiratory specimens during the acute phase of infection. The lowest  concentration of SARS-CoV-2 viral copies this assay can detect is 250  copies / mL. A negative result does not preclude SARS-CoV-2 infection  and should not be used as the sole basis for treatment or other  patient management decisions.  A negative result may occur with  improper specimen collection / handling, submission of specimen other  than nasopharyngeal swab, presence of viral mutation(s) within the  areas targeted by this assay, and inadequate number of viral copies  (<250 copies / mL). A negative result must be combined with clinical  observations, patient history, and epidemiological information. If result is POSITIVE SARS-CoV-2 target nucleic acids are DETECTED.  The SARS-CoV-2 RNA is generally detectable in upper and lower  respiratory specimens during the acute phase of infection.  Positive  results are indicative of active infection with SARS-CoV-2.  Clinical  correlation with patient history and other diagnostic information is  necessary to determine patient infection status.  Positive results do  not rule out bacterial infection or co-infection with other viruses. If result is PRESUMPTIVE  POSTIVE SARS-CoV-2 nucleic acids MAY BE PRESENT.   A presumptive positive result was obtained on the submitted specimen  and confirmed on repeat testing.  While 2019 novel coronavirus  (SARS-CoV-2) nucleic acids may be present in the submitted sample  additional confirmatory testing may be necessary for epidemiological  and / or clinical management purposes  to differentiate between  SARS-CoV-2 and other Sarbecovirus currently known to infect humans.  If clinically indicated additional testing with an alternate test  methodology [phone]) i s advised. The SARS-CoV-2 RNA is generally  detectable in upper and lower respiratory specimens during the acute  phase of infection. The expected result is Negative. Fact Sheet for Patients:  StrictlyIdeas.no Fact Sheet for Healthcare Providers: BankingDealers.co.za This test is not yet approved or cleared by the [place] FDA and has been authorized for detection and/or diagnosis of SARS-CoV-2 by FDA under an Emergency Use Authorization (EUA).  This EUA will remain in effect (meaning this test can be used) for the duration of the COVID-19 declaration under Section 564(b)(1) of the Act, 21 U.S.C. section 360bbb-3(b)(1), unless the authorization is terminated or revoked sooner. Performed at [facility], [place] [address], [place], [place] [address]    Ct Head Wo Contrast  Result Date: 07/10/2018 CLINICAL DATA:  Weakness. EXAM: CT HEAD WITHOUT CONTRAST TECHNIQUE: Contiguous axial images were obtained from the base of the skull through the vertex without intravenous contrast. COMPARISON:  January 25, 2011 FINDINGS: Brain: No subdural, epidural, or subarachnoid hemorrhage. Ventricles and sulci are stable and unremarkable. Cerebellum, brainstem, and basal cisterns are normal. No mass effect or midline shift. White matter changes are noted.  No acute cortical ischemia or infarct. Vascular: Calcified  atherosclerosis is seen in the intracranial carotids. Skull: Normal. Negative for fracture or focal lesion. Sinuses/Orbits: No acute finding. Other: None. IMPRESSION: Chronic white matter changes. No acute intracranial abnormalities identified. Electronically Signed   By: Dorise Bullion III M.D   On: 07/10/2018 11:53   Dg Chest Port 1 View  Result Date: 07/10/2018 CLINICAL DATA:  Fever, hypoxia EXAM: PORTABLE CHEST 1 VIEW COMPARISON:  07/21/2016 FINDINGS: Lungs are essentially clear. No focal consolidation or frank interstitial edema. No pleural effusion or pneumothorax. Mild cardiomegaly.  Prosthetic aortic valve. Surgical clips in the left axilla. Median sternotomy. IMPRESSION: No evidence of acute cardiopulmonary disease. Electronically Signed   By: Julian Hy M.D.   On: 07/10/2018 11:18    Pending Labs Unresulted Labs (From admission, onward)    Start     Ordered   07/11/18 0500  CBC with Differential/Platelet  Daily,   R     07/10/18 1446   07/11/18 0500  D-dimer, quantitative (not at [facility])  Daily,   R     07/10/18 1446   07/11/18 0500  Comprehensive metabolic panel  Daily,   R     07/10/18 1446   07/11/18 0500  Magnesium  Daily,   R     07/10/18 1446   07/10/18 1444  D-dimer, quantitative (not at [facility])  Add-on,   R     07/10/18 1446   07/10/18 1444  Ferritin  Add-on,   R     07/10/18 1446   07/10/18 1444  Lactate dehydrogenase  Add-on,   R     07/10/18 1446   07/10/18 1444  Procalcitonin  Add-on,   R     07/10/18 1446   07/10/18 1439  ABO/Rh  Once,   R     07/10/18 1446   07/10/18 1017  Blood Culture (routine x 2)  BLOOD CULTURE X 2,   STAT     07/10/18 1017   07/10/18 1017  Urinalysis, Routine w reflex microscopic  ONCE - STAT,   STAT     07/10/18 1017          Vitals/Pain Today's Vitals   07/10/18 1254 07/10/18 1315 07/10/18 1345 07/10/18 1415  BP:  (!) 112/56 114/68 (!) 108/58  Pulse:  83 82 84  Resp:  $Remo'13 19 15  'uHtKs$ Temp:      TempSrc:      SpO2:  100% 99%  96%  Weight:      Height:      PainSc: 0-No pain       Isolation Precautions Droplet and Contact precautions  Medications Medications  heparin injection 5,000 Units (has no administration in time range)  sodium chloride flush (NS) 0.9 % injection 3 mL (has no administration in time range)  lactated ringers infusion (has no administration in time range)  acetaminophen (TYLENOL) tablet 650 mg (has no administration in time range)  sodium chloride 0.9 % bolus 500 mL (0 mLs Intravenous Stopped 07/10/18 1254)    Mobility walks with device     Focused Assessments Cardiac Assessment Handoff:  Cardiac Rhythm: Normal sinus rhythm Lab Results  Component Value Date   CKTOTAL 97 05/22/2011   CKMB 1.5 05/22/2011   TROPONINI 0.06 ([place]) 07/10/2018   No results found for: DDIMER Does the Patient currently have chest pain? No     R Recommendations: See Admitting Provider Note  Report given to:   Additional Notes:

## 2018-07-10 NOTE — ED Notes (Signed)
Pt's daughter Bonnita Nasuti) outside pt's room asking for update on pt's status ([employer] employee); EDP updated pt's daughter on status and plan of care

## 2018-07-10 NOTE — Progress Notes (Signed)
07/10/2018 patient transfer from the emergency room to 2W at 1626 she is alert and oriented. Patient skin is intact, was place on telemetry. [facility] RN.

## 2018-07-10 NOTE — ED Triage Notes (Signed)
Pt from [employer] home vias ems; called out for generalized weakness, lethargy, fever, body aches x 2 days; 99.75F tympanic; chest x ray done 3 days ago, pneumonia present in lower R lobe; treating with Levaquin; denies cough, sob; 90% RA, 97% on 2 L; positive Covid pts at facility, pt tested several days ago, still waiting on results; A&O x 4 with ems, sluggish to respond  97.9 temporal 104/54 HR 979 CBG 86 RR 16

## 2018-07-11 DIAGNOSIS — U071 COVID-19: Principal | ICD-10-CM

## 2018-07-11 DIAGNOSIS — I5032 Chronic diastolic (congestive) heart failure: Secondary | ICD-10-CM

## 2018-07-11 DIAGNOSIS — I421 Obstructive hypertrophic cardiomyopathy: Secondary | ICD-10-CM

## 2018-07-11 DIAGNOSIS — Z853 Personal history of malignant neoplasm of breast: Secondary | ICD-10-CM

## 2018-07-11 DIAGNOSIS — I10 Essential (primary) hypertension: Secondary | ICD-10-CM

## 2018-07-11 DIAGNOSIS — E7800 Pure hypercholesterolemia, unspecified: Secondary | ICD-10-CM

## 2018-07-11 DIAGNOSIS — N170 Acute kidney failure with tubular necrosis: Secondary | ICD-10-CM

## 2018-07-11 DIAGNOSIS — I350 Nonrheumatic aortic (valve) stenosis: Secondary | ICD-10-CM

## 2018-07-11 DIAGNOSIS — J988 Other specified respiratory disorders: Secondary | ICD-10-CM

## 2018-07-11 DIAGNOSIS — N183 Chronic kidney disease, stage 3 (moderate): Secondary | ICD-10-CM

## 2018-07-11 LAB — URINALYSIS, ROUTINE W REFLEX MICROSCOPIC
Bilirubin Urine: NEGATIVE
Glucose, UA: NEGATIVE mg/dL
Hgb urine dipstick: NEGATIVE
Ketones, ur: NEGATIVE mg/dL
Nitrite: NEGATIVE
Protein, ur: >=300 mg/dL — AB
Specific Gravity, Urine: 1.02 (ref 1.005–1.030)
pH: 5 (ref 5.0–8.0)

## 2018-07-11 LAB — COMPREHENSIVE METABOLIC PANEL
ALT: 12 U/L (ref 0–44)
AST: 23 U/L (ref 15–41)
Albumin: 2.3 g/dL — ABNORMAL LOW (ref 3.5–5.0)
Alkaline Phosphatase: 71 U/L (ref 38–126)
Anion gap: 14 (ref 5–15)
BUN: 40 mg/dL — ABNORMAL HIGH (ref 8–23)
CO2: 20 mmol/L — ABNORMAL LOW (ref 22–32)
Calcium: 8.2 mg/dL — ABNORMAL LOW (ref 8.9–10.3)
Chloride: 103 mmol/L (ref 98–111)
Creatinine, Ser: 2.91 mg/dL — ABNORMAL HIGH (ref 0.44–1.00)
GFR calc Af Amer: 18 mL/min — ABNORMAL LOW (ref >60)
GFR calc non Af Amer: 15 mL/min — ABNORMAL LOW (ref >60)
Glucose, Bld: 118 mg/dL — ABNORMAL HIGH (ref 70–99)
Potassium: 4 mmol/L (ref 3.5–5.1)
Sodium: 137 mmol/L (ref 135–145)
Total Bilirubin: 0.6 mg/dL (ref 0.3–1.2)
Total Protein: 5.8 g/dL — ABNORMAL LOW (ref 6.5–8.1)

## 2018-07-11 LAB — HEMOGLOBIN A1C
Hgb A1c MFr Bld: 6.9 % — ABNORMAL HIGH (ref 4.8–5.6)
Mean Plasma Glucose: 151.33 mg/dL

## 2018-07-11 LAB — D-DIMER, QUANTITATIVE: D-Dimer, Quant: 1.27 ug/mL-FEU — ABNORMAL HIGH (ref 0.00–0.50)

## 2018-07-11 LAB — GLUCOSE, CAPILLARY
Glucose-Capillary: 101 mg/dL — ABNORMAL HIGH (ref 70–99)
Glucose-Capillary: 104 mg/dL — ABNORMAL HIGH (ref 70–99)
Glucose-Capillary: 106 mg/dL — ABNORMAL HIGH (ref 70–99)
Glucose-Capillary: 97 mg/dL (ref 70–99)
Glucose-Capillary: 98 mg/dL (ref 70–99)

## 2018-07-11 LAB — CBC WITH DIFFERENTIAL/PLATELET
Abs Immature Granulocytes: 0.05 10*3/uL (ref 0.00–0.07)
Basophils Absolute: 0 10*3/uL (ref 0.0–0.1)
Basophils Relative: 0 %
Eosinophils Absolute: 0.1 10*3/uL (ref 0.0–0.5)
Eosinophils Relative: 1 %
HCT: 31.2 % — ABNORMAL LOW (ref 36.0–46.0)
Hemoglobin: 10.3 g/dL — ABNORMAL LOW (ref 12.0–15.0)
Immature Granulocytes: 1 %
Lymphocytes Relative: 12 %
Lymphs Abs: 0.9 10*3/uL (ref 0.7–4.0)
MCH: 30.1 pg (ref 26.0–34.0)
MCHC: 33 g/dL (ref 30.0–36.0)
MCV: 91.2 fL (ref 80.0–100.0)
Monocytes Absolute: 0.6 10*3/uL (ref 0.1–1.0)
Monocytes Relative: 7 %
Neutro Abs: 6.2 10*3/uL (ref 1.7–7.7)
Neutrophils Relative %: 79 %
Platelets: 207 10*3/uL (ref 150–400)
RBC: 3.42 MIL/uL — ABNORMAL LOW (ref 3.87–5.11)
RDW: 13.2 % (ref 11.5–15.5)
WBC: 7.8 10*3/uL (ref 4.0–10.5)
nRBC: 0 % (ref 0.0–0.2)

## 2018-07-11 LAB — MAGNESIUM: Magnesium: 1.6 mg/dL — ABNORMAL LOW (ref 1.7–2.4)

## 2018-07-11 MED ORDER — LACTATED RINGERS IV BOLUS
1000.0000 mL | Freq: Once | INTRAVENOUS | Status: AC
  Administered 2018-07-11: 1000 mL via INTRAVENOUS

## 2018-07-11 MED ORDER — VITAMIN C 500 MG PO TABS
250.0000 mg | ORAL_TABLET | Freq: Two times a day (BID) | ORAL | Status: DC
  Administered 2018-07-12 – 2018-07-19 (×16): 250 mg via ORAL
  Filled 2018-07-11 (×18): qty 1

## 2018-07-11 MED ORDER — ENOXAPARIN SODIUM 30 MG/0.3ML ~~LOC~~ SOLN
30.0000 mg | SUBCUTANEOUS | Status: DC
  Administered 2018-07-11 – 2018-07-18 (×8): 30 mg via SUBCUTANEOUS
  Filled 2018-07-11 (×8): qty 0.3

## 2018-07-11 MED ORDER — PAROXETINE HCL 20 MG PO TABS
30.0000 mg | ORAL_TABLET | Freq: Every day | ORAL | Status: DC
  Administered 2018-07-12 – 2018-07-19 (×8): 30 mg via ORAL
  Filled 2018-07-11 (×9): qty 1.5

## 2018-07-11 MED ORDER — PAROXETINE HCL 20 MG PO TABS
20.0000 mg | ORAL_TABLET | Freq: Every day | ORAL | Status: DC
  Filled 2018-07-11: qty 1

## 2018-07-11 MED ORDER — ZINC SULFATE 220 (50 ZN) MG PO CAPS
220.0000 mg | ORAL_CAPSULE | Freq: Every day | ORAL | Status: DC
  Administered 2018-07-12 – 2018-07-19 (×9): 220 mg via ORAL
  Filled 2018-07-11 (×10): qty 1

## 2018-07-11 NOTE — Progress Notes (Signed)
Patient transferred from [facility] to [facility] room 159 via [place]. Skin assessment complete. CHG wipes, pericare performed, purewick changed, foam pads to heels and sacrum replaced. New small foam pad placed to abrasion on abd rlq noted to have bandaid soiled with fresh blood upon admission. Oriented to room including call bell, phone, bedside table and belongings. Questions encouraged and answered. Physical assessment completed. Spoke with daughters Ernst Bowler and [name]. Encouraged them to call if any questions arise and told them that I would be in touch with any changes or updates.

## 2018-07-11 NOTE — Progress Notes (Deleted)
Reported attempted.

## 2018-07-11 NOTE — Progress Notes (Deleted)
Attempted to call report to [facility].  No answer.

## 2018-07-11 NOTE — Progress Notes (Signed)
[name] HCW:[id number] DOB: 09-23-1945 DOA: 07/10/2018 PCP: No primary care provider on file.   Subj: 72-year BF  PMHx CVA, depression, adenocarcinoma of breast s/p mastectomy+ chemotherapy, hypertropic obstructive cardiomyopathy, chronic diastolic CHF, s/p bioprosthetic AV valve with dynamic obstruction in outflow tract, moderate aortic stenosis,, type 2 diabetes uncontrolled with complication, CKD stage III,  Presented from her assisted living facility with 6 days of fever, myalgias, and fatigue.  Patient endorses feeling short of breath, though she does not have very much cough.  She says the physician at the assisted living facility did a chest x-ray earlier in the week and said that she had a pneumonia.  They have tried treating this with levofloxacin.  At baseline [name] reports that the patient is fairly functional.  She moved to the assisted living facility about 1 year ago because of recurrent falls at home.  The time she was living by herself.  At baseline she has an appropriate memory, conversational, ambulatory, and independent in all her activities of daily living.  They deny any recent travel.  Denies any recent changes in her health, no recent hospitalizations, no recent changes in her medications.  It is noted in the chart that there has been several cases of COVID-19 at her assisted living facility recently.      Obj: Objective: VITAL SIGNS: Temp: 100.5 F (38.1 C) (05/17 1700) Temp Source: Oral (05/17 1700) BP: 155/70 (05/17 1700) Pulse Rate: 85 (05/17 1700) SPO2; FIO2:   Intake/Output Summary (Last 24 hours) at 07/11/2018 2052 Last data filed at 07/11/2018 1500 Gross per 24 hour  Intake 803.92 ml  Output 850 ml  Net -46.08 ml     Exam: General: A/O x4, no acute respiratory distress Lungs: Diffuse decreased breath sounds, mild expiratory wheeze, negative crackles  Cardiovascular: Regular rate and rhythm without murmur gallop or rub normal S1 and  S2, well-healed midline chest incision. Abdomen: Nontender, nondistended, soft, bowel sounds positive, no rebound, no ascites, no appreciable mass Extremities: No significant cyanosis, clubbing, or edema bilateral lower extremities   Procedure/Significant Events:   I have personally reviewed and interpreted all radiology studies and my findings are as above.   Culture 5/16 blood RIGHT hand NGTD 5/16 blood RIGHT forearm NGTD 5/16 SARS coronavirus positive   Antibiotics: Anti-infectives (From admission, onward)   None       A/P  COVID-19:  Pneumonia -Patient admitted with symptoms consistent with COVID-19.   Thanks rapid SARS-CoV-2 test positive.  Currently requiring 2 L nasal cannula and saturating very well.  Respiratory status appears reassuring.  She does have several underlying medical conditions that put her at risk for a bad outcome including heart disease, diabetes, hypertension.  Which places patient at risk for ARDS/MODS  -Will treat with supportive care, admit to telemetry floor, [facility] currently unable to accept the patient due to high census.  Stop levofloxacin as seems low risk for bacterial pneumonia.  Heparin for VTE prophylaxis.   Acute on chronic kidney disease: (Baseline Cr ~1.3) -Creatinine now up to 2.9.  Likely due to dehydration in the setting of acute illness.   -Plan to hold Lasix.   -Will hydrate with IV LR at 75 mL/h for the next 24 hours.  Received a 500 mL bolus in the ED.  Recheck labs in the morning.   Chronic heart failure with preserved ejection fraction:  -Noted to be probable hypertrophic obstructive cardiomyopathy on echo in 2013.  We will continue with her beta-blocker.  We are hydrating her today with IV fluids given that she appears hypovolemic.  Continue with metoprolol succinate 25 mg daily.  Hold Lasix for now.  Ischemic heart disease noted in her chart, will continue with aspirin and atorvastatin.   Diabetes type 2  controlled with complication:  -5/48 hemoglobin A1c = 6.9  -Continue with long-acting insulin 10 units daily.   -Sensitive sliding scale insulin.   -Hold metformin during acute illness and renal failure. -Lipid panel pending          Care during the described time interval was provided by me .  I have reviewed this patient's available data, including medical history, events of note, physical examination, and all test results as part of my evaluation.

## 2018-07-11 NOTE — Progress Notes (Signed)
Updated daughter Ernst Bowler on pt's condition.

## 2018-07-11 NOTE — Plan of Care (Signed)
Pt recent admission, ongoing teaching in progress regarding care and treatment.

## 2018-07-11 NOTE — Progress Notes (Signed)
    S: Patient feels about the same today.  Says she is exhausted.  Not eating very much, had a few bites of applesauce.  Shortness of breath is stable.  No change in cough.  No new complaints of pain.  O:  Blood pressure 138/61, pulse 79, temperature 99 F (37.2 C), temperature source Oral, resp. rate 16, height $RemoveBe'5\' 6"'rjMXPbHCy$  (1.676 m), weight 76.2 kg, SpO2 98 %. Gen: Weak appearing woman, no distress Heart: 3 out of 6 early systolic murmur throughout the precordium, regular rate and rhythm Lungs: Unlabored breathing, clear anteriorly, few coarse crackles at the bilateral posterior bases, no wheezing Abd: Mildly distended, soft, nontender Ext: Warm and well-perfused, no lower extremity edema Skin: Warm, dry mucous membranes  A/P: Hospital day #2 for this 73 year old person with chronic heart failure with preserved ejection fraction, insulin-dependent diabetes, acute on chronic kidney disease, due to acute COVID-19.  COVID-19: SARS-CoV-2 positive rapid test.  Saturating well on 2 L nasal cannula with reassuring respiratory status.  Plan is to continue with supportive care.  Acute on chronic renal failure: Most likely due to hypovolemia in the setting of acute illness.  Urine still appears dark today and she still looks dehydrated.  Baseline creatinine 1.3, 2.9 on admission, unchanged today. Received over 2 L of fluid yesterday but still having a very low oral intake.  Plan is to bolus 1 L of LR now over 2 hours.  And then continue with maintenance LR at 75 mL/h for another 24 hours.  Recheck renal function tomorrow.  Urinalysis today to rule out infection.  Chronic heart failure with preserved ejection fraction: Appears hypovolemic today.  Lasix is on hold.  We are gently giving IV fluids.  Continue with metoprolol.  Diabetes: Fingerstick glucose has been reasonable.  Very low oral intake for meals.  Continue with Lantus 10 units nightly and a sensitive sliding scale.  Holding home metformin acute renal  failure.  Change heparin to Lovenox 30 mg daily for DVT prophylaxis  CODE STATUS: Full code, confirmed with her HPOA, Hyman Hopes [phone]) on admission.  Patient is a Jehovah witness and would not accept blood products.  Ms. Tasia Catchings would like a update over the phone daily  Dispo: May be able to transfer to the [facility] today if a bed is available.   Axel Filler, MD 07/11/2018, 9:12 AM

## 2018-07-12 LAB — CBC WITH DIFFERENTIAL/PLATELET
Abs Immature Granulocytes: 0.1 10*3/uL — ABNORMAL HIGH (ref 0.00–0.07)
Basophils Absolute: 0 10*3/uL (ref 0.0–0.1)
Basophils Relative: 0 %
Eosinophils Absolute: 0 10*3/uL (ref 0.0–0.5)
Eosinophils Relative: 0 %
HCT: 31 % — ABNORMAL LOW (ref 36.0–46.0)
Hemoglobin: 10.2 g/dL — ABNORMAL LOW (ref 12.0–15.0)
Immature Granulocytes: 1 %
Lymphocytes Relative: 10 %
Lymphs Abs: 1 10*3/uL (ref 0.7–4.0)
MCH: 29.9 pg (ref 26.0–34.0)
MCHC: 32.9 g/dL (ref 30.0–36.0)
MCV: 90.9 fL (ref 80.0–100.0)
Monocytes Absolute: 0.5 10*3/uL (ref 0.1–1.0)
Monocytes Relative: 5 %
Neutro Abs: 8 10*3/uL — ABNORMAL HIGH (ref 1.7–7.7)
Neutrophils Relative %: 84 %
Platelets: 233 10*3/uL (ref 150–400)
RBC: 3.41 MIL/uL — ABNORMAL LOW (ref 3.87–5.11)
RDW: 13.3 % (ref 11.5–15.5)
WBC: 9.7 10*3/uL (ref 4.0–10.5)
nRBC: 0 % (ref 0.0–0.2)

## 2018-07-12 LAB — GLUCOSE, CAPILLARY
Glucose-Capillary: 86 mg/dL (ref 70–99)
Glucose-Capillary: 94 mg/dL (ref 70–99)
Glucose-Capillary: 91 mg/dL (ref 70–99)
Glucose-Capillary: 76 mg/dL (ref 70–99)

## 2018-07-12 LAB — COMPREHENSIVE METABOLIC PANEL
ALT: 14 U/L (ref 0–44)
AST: 25 U/L (ref 15–41)
Albumin: 2.3 g/dL — ABNORMAL LOW (ref 3.5–5.0)
Alkaline Phosphatase: 68 U/L (ref 38–126)
Anion gap: 11 (ref 5–15)
BUN: 45 mg/dL — ABNORMAL HIGH (ref 8–23)
CO2: 21 mmol/L — ABNORMAL LOW (ref 22–32)
Calcium: 7.9 mg/dL — ABNORMAL LOW (ref 8.9–10.3)
Chloride: 105 mmol/L (ref 98–111)
Creatinine, Ser: 2.64 mg/dL — ABNORMAL HIGH (ref 0.44–1.00)
GFR calc Af Amer: 20 mL/min — ABNORMAL LOW (ref >60)
GFR calc non Af Amer: 17 mL/min — ABNORMAL LOW (ref >60)
Glucose, Bld: 106 mg/dL — ABNORMAL HIGH (ref 70–99)
Potassium: 3.9 mmol/L (ref 3.5–5.1)
Sodium: 137 mmol/L (ref 135–145)
Total Bilirubin: 0.2 mg/dL — ABNORMAL LOW (ref 0.3–1.2)
Total Protein: 5.8 g/dL — ABNORMAL LOW (ref 6.5–8.1)

## 2018-07-12 LAB — D-DIMER, QUANTITATIVE
D-Dimer, Quant: 1.46 ug/mL-FEU — ABNORMAL HIGH (ref 0.00–0.50)
D-Dimer, Quant: 1.44 ug/mL-FEU — ABNORMAL HIGH (ref 0.00–0.50)

## 2018-07-12 LAB — LIPID PANEL
Cholesterol: 67 mg/dL (ref 0–200)
HDL: 14 mg/dL — ABNORMAL LOW (ref >40)
LDL Cholesterol: 24 mg/dL (ref 0–99)
Total CHOL/HDL Ratio: 4.8 RATIO
Triglycerides: 144 mg/dL (ref <150)
VLDL: 29 mg/dL (ref 0–40)

## 2018-07-12 LAB — CK: Total CK: 170 U/L (ref 38–234)

## 2018-07-12 LAB — FERRITIN
Ferritin: 285 ng/mL (ref 11–307)
Ferritin: 284 ng/mL (ref 11–307)

## 2018-07-12 LAB — C-REACTIVE PROTEIN
CRP: 13.1 mg/dL — ABNORMAL HIGH (ref <1.0)
CRP: 12.5 mg/dL — ABNORMAL HIGH (ref <1.0)

## 2018-07-12 LAB — SEDIMENTATION RATE: Sed Rate: 93 mm/hr — ABNORMAL HIGH (ref 0–22)

## 2018-07-12 LAB — MAGNESIUM: Magnesium: 1.6 mg/dL — ABNORMAL LOW (ref 1.7–2.4)

## 2018-07-12 LAB — PHOSPHORUS: Phosphorus: 4.3 mg/dL (ref 2.5–4.6)

## 2018-07-12 LAB — BRAIN NATRIURETIC PEPTIDE: B Natriuretic Peptide: 902.3 pg/mL — ABNORMAL HIGH (ref 0.0–100.0)

## 2018-07-12 LAB — TROPONIN I: Troponin I: 0.07 ng/mL (ref <0.03)

## 2018-07-12 LAB — FIBRINOGEN: Fibrinogen: 686 mg/dL — ABNORMAL HIGH (ref 210–475)

## 2018-07-12 LAB — ABO/RH: ABO/RH(D): O POS

## 2018-07-12 LAB — LACTATE DEHYDROGENASE: LDH: 262 U/L — ABNORMAL HIGH (ref 98–192)

## 2018-07-12 LAB — PROCALCITONIN: Procalcitonin: 0.32 ng/mL

## 2018-07-12 MED ORDER — MAGNESIUM SULFATE 2 GM/50ML IV SOLN
2.0000 g | Freq: Once | INTRAVENOUS | Status: AC
  Administered 2018-07-12: 2 g via INTRAVENOUS
  Filled 2018-07-12: qty 50

## 2018-07-12 NOTE — Progress Notes (Signed)
Patient's daughter, [name] called. Updated. Questions encouraged and answered. She spoke with her mother as well.

## 2018-07-12 NOTE — Progress Notes (Signed)
PROGRESS NOTE  [name]  DXA:[id number] DOB: [birth year] DOA: 07/10/2018 PCP: No primary care provider on file.   Brief Narrative: [name] is a 73 y.o. female with a history of breast CA s/p mastectomy, chemotherapy, HOCM, chornic HFpEF, mod AS s/p bioprosthetic AVR, T2DM, stage III CKD, and hx CVA who presented from ALF with 6 days of fever, myalgias and fatigue despite taking levaquin as prescribed by MD as outpatient for suspected pneumonia. She was seen in the ED and admitted for covid-19 infection, subsequently transferred to [facility] on 5/17.   Assessment & Plan: Principal Problem:   COVID-19 Active Problems:   Hyperlipidemia   Refusal of blood transfusions as patient is Jehovah's Witness   History of adenocarcinoma of breast   Aortic stenosis s/p Tissure AVR 2006   CAD (coronary artery disease)   Hypertension   Acute on chronic renal failure (HCC)   HOCM (hypertrophic obstructive cardiomyopathy) (HCC)   Peripheral arterial disease (HCC)   Chronic diastolic CHF (congestive heart failure) ([place])  Covid-19 infection:  - Continue airborne, contact precautions. PPE including surgical gown, gloves, face shield, cap, shoe covers, and N-95 used during this encounter in a negative pressure room.  - Check daily labs: CBC w/diff, CMP, d-dimer, fibrinogen, ferritin, LDH, CRP - Enoxaparin prophylactic dose, 30mg  as CrCl < 6ml/min. - Blood cultures drawn on admission. PCT low, No infiltrate on CXR, agree with stopping abx. - Maintain euvolemia/net negative.  - Avoid NSAIDs - Recommend proning and aggressive use of incentive spirometry - Low threshold to start steroids/actemra if infiltrates develop or hypoxia worsens.  Weakness: Nonfocal, negative CT head. Suspect due to covid infection.  - PT/OT, mobilize as much as possible.   T2DM: HbA1c 6.9% indicating good control.  - Continue long-acting insulin and SSI, holding metformin.   AKI on stage III CKD:  - Continue holding  diuretic and gave IVF. BNP 902, though this has been chronically elevated.  - Monitor UOP, I/O  Chronic HFpEF, HOCM, CAD:  - Continue beta blocker, ASA, statin - Monitor I/O, weights  Prolonged QTc:  - Magnesium supplementation as below.  - Continue telemetry.  - Avoid azithromycin and other provocative agents.   Hypomagnesemia:  - Supplement and recheck in AM  Anemia of chronic disease: Hgb stable 10-11.  - Not patient is Jehovah's Witness and would not consent to blood products were they to be indicated.   DVT prophylaxis: Lovenox Code Status: Full confirmed earlier in hospitalization Family Communication: None at bedside Disposition Plan: ALF once improving, prognosis is guarded  Consultants:   None  Procedures:   None  Antimicrobials:  None   Subjective: Feels very weak, requesting assistance with breakfast. Dyspnea is constant, moderate, stable.  Objective: Vitals:   07/12/18 0400 07/12/18 0800 07/12/18 1200 07/12/18 1400  BP: (!) 135/10 131/65    Pulse:  82  80  Resp:  16  17  Temp: 98.6 F (37 C) 98.6 F (37 C) 98.6 F (37 C)   TempSrc: Oral     SpO2:  98%  98%  Weight:      Height:        Intake/Output Summary (Last 24 hours) at 07/12/2018 1443 Last data filed at 07/12/2018 0700 Gross per 24 hour  Intake 240 ml  Output 100 ml  Net 140 ml   Filed Weights   07/10/18 1000  Weight: 76.2 kg    Gen: 73 y.o. female in no distress Pulm: Non-labored breathing supplemental oxygen. Clear to auscultation  bilaterally.  CV: Regular rate and rhythm. No murmur, rub, or gallop. No JVD, trace bilateral pedal edema. GI: Abdomen soft, non-tender, non-distended, with normoactive bowel sounds. No organomegaly or masses felt. Ext: Warm, no deformities Skin: No rashes, lesions or ulcers Neuro: Alert and oriented. No focal neurological deficits. Psych: Judgement and insight appear normal. Mood & affect appropriate.   Data Reviewed: I have personally reviewed  following labs and imaging studies  CBC: Recent Labs  Lab 07/10/18 1025 07/11/18 0631 07/12/18 0316  WBC 7.7 7.8 9.7  NEUTROABS 6.1 6.2 8.0*  HGB 10.8* 10.3* 10.2*  HCT 33.6* 31.2* 31.0*  MCV 92.3 91.2 90.9  PLT 194 207 390   Basic Metabolic Panel: Recent Labs  Lab 07/10/18 1025 07/11/18 0631 07/11/18 2304 07/12/18 0316  NA 137 137 137  --   K 3.7 4.0 3.9  --   CL 106 103 105  --   CO2 19* 20* 21*  --   GLUCOSE 69* 118* 106*  --   BUN 37* 40* 45*  --   CREATININE 2.94* 2.91* 2.64*  --   CALCIUM 8.2* 8.2* 7.9*  --   MG  --  1.6*  --  1.6*  PHOS  --   --   --  4.3   GFR: Estimated Creatinine Clearance: 19.5 mL/min (A) (by C-G formula based on SCr of 2.64 mg/dL (H)). Liver Function Tests: Recent Labs  Lab 07/10/18 1025 07/11/18 0631 07/11/18 2304  AST $Re'20 23 25  'TMn$ ALT $R'13 12 14  'as$ ALKPHOS 69 71 68  BILITOT 0.6 0.6 0.2*  PROT 5.9* 5.8* 5.8*  ALBUMIN 2.6* 2.3* 2.3*   No results for input(s): LIPASE, AMYLASE in the last 168 hours. No results for input(s): AMMONIA in the last 168 hours. Coagulation Profile: No results for input(s): INR, PROTIME in the last 168 hours. Cardiac Enzymes: Recent Labs  Lab 07/10/18 1025 07/11/18 2304 07/12/18 0316  CKTOTAL  --   --  170  TROPONINI 0.06* 0.07*  --    BNP (last 3 results) No results for input(s): PROBNP in the last 8760 hours. HbA1C: Recent Labs    07/11/18 0631  HGBA1C 6.9*   CBG: Recent Labs  Lab 07/11/18 1200 07/11/18 1719 07/11/18 2322 07/12/18 0812 07/12/18 1218  GLUCAP 106* 101* 97 86 94   Lipid Profile: Recent Labs    07/12/18 0316  CHOL 67  HDL 14*  LDLCALC 24  TRIG 144  CHOLHDL 4.8   Thyroid Function Tests: No results for input(s): TSH, T4TOTAL, FREET4, T3FREE, THYROIDAB in the last 72 hours. Anemia Panel: Recent Labs    07/11/18 2304 07/12/18 0316  FERRITIN 285 284   Urine analysis:    Component Value Date/Time   COLORURINE AMBER (A) 07/11/2018 1826   APPEARANCEUR HAZY (A)  07/11/2018 1826   LABSPEC 1.020 07/11/2018 1826   PHURINE 5.0 07/11/2018 1826   GLUCOSEU NEGATIVE 07/11/2018 1826   GLUCOSEU NEG mg/dL 12/25/2006 2041   HGBUR NEGATIVE 07/11/2018 1826   HGBUR trace-lysed 03/05/2010 0856   BILIRUBINUR NEGATIVE 07/11/2018 1826   BILIRUBINUR negative 11/11/2011 1701   KETONESUR NEGATIVE 07/11/2018 1826   PROTEINUR >=300 (A) 07/11/2018 1826   UROBILINOGEN 0.2 11/11/2011 1701   UROBILINOGEN 0.2 11/11/2011 1645   NITRITE NEGATIVE 07/11/2018 1826   LEUKOCYTESUR SMALL (A) 07/11/2018 1826   Recent Results (from the past 240 hour(s))  Blood Culture (routine x 2)     Status: None (Preliminary result)   Collection Time: 07/10/18 10:25 AM  Result  Value Ref Range Status   Specimen Description BLOOD RIGHT HAND  Final   Special Requests   Final    BOTTLES DRAWN AEROBIC AND ANAEROBIC Blood Culture results may not be optimal due to an inadequate volume of blood received in culture bottles   Culture   Final    NO GROWTH 2 DAYS Performed at [facility], [place] [address] [place], [place] [address]    Report Status PENDING  Incomplete  SARS Coronavirus 2 (CEPHEID- Performed in [place] hospital lab), Hosp Order     Status: Abnormal   Collection Time: 07/10/18 10:30 AM  Result Value Ref Range Status   SARS Coronavirus 2 POSITIVE (A) NEGATIVE Final    Comment: CRITICAL RESULT CALLED TO, READ BACK BY AND VERIFIED WITH: RN CONNIE V. [id number] (NOTE) If result is NEGATIVE SARS-CoV-2 target nucleic acids are NOT DETECTED. The SARS-CoV-2 RNA is generally detectable in upper and lower  respiratory specimens during the acute phase of infection. The lowest  concentration of SARS-CoV-2 viral copies this assay can detect is 250  copies / mL. A negative result does not preclude SARS-CoV-2 infection  and should not be used as the sole basis for treatment or other  patient management decisions.  A negative result may occur with  improper specimen collection /  handling, submission of specimen other  than nasopharyngeal swab, presence of viral mutation(s) within the  areas targeted by this assay, and inadequate number of viral copies  (<250 copies / mL). A negative result must be combined with clinical  observations, patient history, and epidemiological information. If result is POSITIVE SARS-CoV-2 target nucleic acids are DETECTED.  The SARS-CoV-2 RNA is generally detectable in upper and lower  respiratory specimens during the acute phase of infection.  Positive  results are indicative of active infection with SARS-CoV-2.  Clinical  correlation with patient history and other diagnostic information is  necessary to determine patient infection status.  Positive results do  not rule out bacterial infection or co-infection with other viruses. If result is PRESUMPTIVE POSTIVE SARS-CoV-2 nucleic acids MAY BE PRESENT.   A presumptive positive result was obtained on the submitted specimen  and confirmed on repeat testing.  While 2019 novel coronavirus  (SARS-CoV-2) nucleic acids may be present in the submitted sample  additional confirmatory testing may be necessary for epidemiological  and / or clinical management purposes  to differentiate between  SARS-CoV-2 and other Sarbecovirus currently known to infect humans.  If clinically indicated additional testing with an alternate test  methodology [phone]) i s advised. The SARS-CoV-2 RNA is generally  detectable in upper and lower respiratory specimens during the acute  phase of infection. The expected result is Negative. Fact Sheet for Patients:  StrictlyIdeas.no Fact Sheet for Healthcare Providers: BankingDealers.co.za This test is not yet approved or cleared by the [place] FDA and has been authorized for detection and/or diagnosis of SARS-CoV-2 by FDA under an Emergency Use Authorization (EUA).  This EUA will remain in effect (meaning this  test can be used) for the duration of the COVID-19 declaration under Section 564(b)(1) of the Act, 21 U.S.C. section 360bbb-3(b)(1), unless the authorization is terminated or revoked sooner. Performed at [facility], [place] [address], [place], [place] [address]   Blood Culture (routine x 2)     Status: None (Preliminary result)   Collection Time: 07/10/18 10:56 AM  Result Value Ref Range Status   Specimen Description BLOOD RIGHT FOREARM  Final  Special Requests   Final    BOTTLES DRAWN AEROBIC AND ANAEROBIC Blood Culture adequate volume   Culture   Final    NO GROWTH 2 DAYS Performed at [facility], [place] [address] [place], [place] [address]    Report Status PENDING  Incomplete      Radiology Studies: No results found.  Scheduled Meds: . aspirin EC  81 mg Oral Daily  . atorvastatin  80 mg Oral QHS  . enoxaparin (LOVENOX) injection  30 mg Subcutaneous Q24H  . insulin aspart  0-9 Units Subcutaneous TID WC  . insulin glargine  10 Units Subcutaneous QHS  . metoprolol succinate  25 mg Oral Daily  . PARoxetine  30 mg Oral Daily  . senna-docusate  1 tablet Oral QHS  . sodium chloride flush  3 mL Intravenous Q12H  . vitamin C  250 mg Oral BID  . zinc sulfate  220 mg Oral Daily   Continuous Infusions: . lactated ringers 75 mL/hr at 07/10/18 1831     LOS: 2 days   Time spent: 25 minutes.  Patrecia Pour, MD [facility] [email or web address] Password [facility] 07/12/2018, 2:43 PM

## 2018-07-12 NOTE — Progress Notes (Signed)
Updated daughter, Radene Gunning [phone], on patients condition and treatment plan.  No questions or concerns noted at this time.

## 2018-07-12 NOTE — TOC Initial Note (Signed)
Transition of Care [facility]) - Initial/Assessment Note    Patient Details  Name: [name] MRN: [id number] Date of Birth: [birth year]  [facility] CM/SW Contact:    Gildardo Griffes, [place] Phone Number: [phone] 07/12/2018, 10:06 AM  Clinical Narrative:                  CSW consulted with patient's daughter [name] at [phone] to introduce role and provide contact information. [name] reports she is point of contact at this time, as patient has 8 children so she will relay information to family. [name] confirms patient is from [facility]. CSW has reached out to [facility] who reports patient will need 2 negative COVID tests before returning. If patient needs HH PT/OT at discharge, facility uses [facility]. CSW informed [name] of this information, she acknowledges this and would like patient to return to [facility] at discharge. [name] expressed appreciation for call. CSW will continue to follow for discharge planning and support.   Expected Discharge Plan: Assisted Living Barriers to Discharge: Continued Medical Work up   Patient Goals and CMS Choice   CMS Medicare.gov Compare Post Acute Care list provided to:: Patient Represenative (must comment)([name] (daughter)) Choice offered to / list presented to : Adult Children([name])  Expected Discharge Plan and Services Expected Discharge Plan: Assisted Living     Post Acute Care Choice: (ALF) Living arrangements for the past 2 months: Assisted Living Facility                                      Prior Living Arrangements/Services Living arrangements for the past 2 months: Assisted Living Facility Lives with:: Self Patient language and need for interpreter reviewed:: Yes Do you feel safe going back to the place where you live?: Yes      Need for Family Participation in Patient Care: Yes (Comment) Care giver support system in place?: Yes (comment)   Criminal Activity/Legal  Involvement Pertinent to Current Situation/Hospitalization: No - Comment as needed  Activities of Daily Living Home Assistive Devices/Equipment: Environmental consultant (specify type), Wheelchair ADL Screening (condition at time of admission) Patient's cognitive ability adequate to safely complete daily activities?: Yes Is the patient deaf or have difficulty hearing?: No Does the patient have difficulty seeing, even when wearing glasses/contacts?: No Does the patient have difficulty concentrating, remembering, or making decisions?: No Patient able to express need for assistance with ADLs?: Yes Does the patient have difficulty dressing or bathing?: Yes Independently performs ADLs?: No Communication: Independent Dressing (OT): Dependent Is this a change from baseline?: Change from baseline, expected to last >3 days Grooming: Dependent Is this a change from baseline?: Change from baseline, expected to last >3 days Feeding: Dependent Is this a change from baseline?: Change from baseline, expected to last >3 days Bathing: Dependent Is this a change from baseline?: Change from baseline, expected to last >3 days Toileting: Dependent Is this a change from baseline?: Change from baseline, expected to last >3days In/Out Bed: Dependent Is this a change from baseline?: Change from baseline, expected to last >3 days Walks in Home: Dependent Is this a change from baseline?: Change from baseline, expected to last >3 days Does the patient have difficulty walking or climbing stairs?: Yes Weakness of Legs: Both Weakness of Arms/Hands: Both  Permission Sought/Granted Permission sought to share information with : Case Production designer, theatre/television/film, Magazine features editor, Family Supports Permission  granted to share information with : Yes, Verbal Permission Granted  Share Information with NAME: [name]  Permission granted to share info w AGENCY: ALF  Permission granted to share info w Relationship: daughter  Permission granted to  share info w Contact Information: [phone]  Emotional Assessment Appearance:: Other (Comment Required(unable to assess - remote) Attitude/Demeanor/Rapport: Unable to Assess Affect (typically observed): Unable to Assess Orientation: : Oriented to Self, Oriented to Place Alcohol / Substance Use: Not Applicable Psych Involvement: No (comment)  Admission diagnosis:  AKI (acute kidney injury) ([place]) [N17.9] COVID-19 [U07.1, J98.8] Patient Active Problem List   Diagnosis Date Noted  . COVID-19 07/10/2018  . AKI (acute kidney injury) ([place]) 07/22/2017  . Hypothermia 07/22/2017  . Hypoglycemia 07/21/2017  . Chronic diastolic CHF (congestive heart failure) ([place]) 06/22/2017  . Peripheral arterial disease ([place]) 06/18/2017  . B12 deficiency 05/01/2017  . Bilateral lower extremity edema 11/08/2015  . Aortic atherosclerosis ([place]) 06/07/2014  . Abnormality of gait 05/29/2012  . HOCM (hypertrophic obstructive cardiomyopathy) ([place]) 06/11/2011  . Acute on chronic renal failure ([place]) 05/25/2011  . Routine health maintenance 06/10/2010  . Hyperlipidemia   . Refusal of blood transfusions as patient is Jehovah's Witness   . History of adenocarcinoma of breast   . Osteoporosis   . Status post CVA   . Aortic stenosis s/p Tissure AVR 2006   . CAD (coronary artery disease)   . Hypertension   . Depression 01/23/2006  . Diabetes mellitus with renal manifestations, controlled ([place]) 03/25/1990   PCP:  No primary care provider on file. Pharmacy:   [place], [place] - [address] [place] [address] Phone: [phone] Fax: [phone]     Social Determinants of Health ([place]) Interventions    Readmission Risk Interventions No flowsheet data found.

## 2018-07-13 LAB — GLUCOSE, CAPILLARY
Glucose-Capillary: 54 mg/dL — ABNORMAL LOW (ref 70–99)
Glucose-Capillary: 110 mg/dL — ABNORMAL HIGH (ref 70–99)
Glucose-Capillary: 91 mg/dL (ref 70–99)
Glucose-Capillary: 103 mg/dL — ABNORMAL HIGH (ref 70–99)

## 2018-07-13 LAB — CBC WITH DIFFERENTIAL/PLATELET
Abs Immature Granulocytes: 0.09 10*3/uL — ABNORMAL HIGH (ref 0.00–0.07)
Basophils Absolute: 0 10*3/uL (ref 0.0–0.1)
Basophils Relative: 0 %
Eosinophils Absolute: 0.1 10*3/uL (ref 0.0–0.5)
Eosinophils Relative: 1 %
HCT: 31.6 % — ABNORMAL LOW (ref 36.0–46.0)
Hemoglobin: 10.2 g/dL — ABNORMAL LOW (ref 12.0–15.0)
Immature Granulocytes: 1 %
Lymphocytes Relative: 10 %
Lymphs Abs: 1.1 10*3/uL (ref 0.7–4.0)
MCH: 29.3 pg (ref 26.0–34.0)
MCHC: 32.3 g/dL (ref 30.0–36.0)
MCV: 90.8 fL (ref 80.0–100.0)
Monocytes Absolute: 0.6 10*3/uL (ref 0.1–1.0)
Monocytes Relative: 5 %
Neutro Abs: 9.3 10*3/uL — ABNORMAL HIGH (ref 1.7–7.7)
Neutrophils Relative %: 83 %
Platelets: 278 10*3/uL (ref 150–400)
RBC: 3.48 MIL/uL — ABNORMAL LOW (ref 3.87–5.11)
RDW: 13.4 % (ref 11.5–15.5)
WBC: 11.1 10*3/uL — ABNORMAL HIGH (ref 4.0–10.5)
nRBC: 0 % (ref 0.0–0.2)

## 2018-07-13 LAB — COMPREHENSIVE METABOLIC PANEL
ALT: 19 U/L (ref 0–44)
AST: 36 U/L (ref 15–41)
Albumin: 2.2 g/dL — ABNORMAL LOW (ref 3.5–5.0)
Alkaline Phosphatase: 77 U/L (ref 38–126)
Anion gap: 10 (ref 5–15)
BUN: 54 mg/dL — ABNORMAL HIGH (ref 8–23)
CO2: 22 mmol/L (ref 22–32)
Calcium: 8.1 mg/dL — ABNORMAL LOW (ref 8.9–10.3)
Chloride: 107 mmol/L (ref 98–111)
Creatinine, Ser: 2.52 mg/dL — ABNORMAL HIGH (ref 0.44–1.00)
GFR calc Af Amer: 21 mL/min — ABNORMAL LOW (ref >60)
GFR calc non Af Amer: 18 mL/min — ABNORMAL LOW (ref >60)
Glucose, Bld: 62 mg/dL — ABNORMAL LOW (ref 70–99)
Potassium: 4.1 mmol/L (ref 3.5–5.1)
Sodium: 139 mmol/L (ref 135–145)
Total Bilirubin: 0.4 mg/dL (ref 0.3–1.2)
Total Protein: 5.9 g/dL — ABNORMAL LOW (ref 6.5–8.1)

## 2018-07-13 LAB — PHOSPHORUS: Phosphorus: 4.4 mg/dL (ref 2.5–4.6)

## 2018-07-13 LAB — CK: Total CK: 116 U/L (ref 38–234)

## 2018-07-13 LAB — C-REACTIVE PROTEIN: CRP: 17.3 mg/dL — ABNORMAL HIGH (ref <1.0)

## 2018-07-13 LAB — FERRITIN: Ferritin: 423 ng/mL — ABNORMAL HIGH (ref 11–307)

## 2018-07-13 LAB — HEPATITIS B SURFACE ANTIGEN: Hepatitis B Surface Ag: NEGATIVE

## 2018-07-13 LAB — D-DIMER, QUANTITATIVE: D-Dimer, Quant: 1.5 ug/mL-FEU — ABNORMAL HIGH (ref 0.00–0.50)

## 2018-07-13 MED ORDER — INSULIN GLARGINE 100 UNIT/ML ~~LOC~~ SOLN
5.0000 [IU] | Freq: Every day | SUBCUTANEOUS | Status: DC
  Administered 2018-07-13 – 2018-07-16 (×4): 5 [IU] via SUBCUTANEOUS
  Filled 2018-07-13 (×4): qty 0.05

## 2018-07-13 NOTE — Progress Notes (Addendum)
PROGRESS NOTE  [name]  UTM:[id number] DOB: 1946-02-24 DOA: 07/10/2018 PCP: No primary care provider on file.   Brief Narrative: [name] is a 73 y.o. female with a history of breast CA s/p mastectomy, chemotherapy, HOCM, chornic HFpEF, mod AS s/p bioprosthetic AVR, T2DM, stage III CKD, and hx CVA who presented from ALF with 6 days of fever, myalgias and fatigue despite taking levaquin as prescribed by MD as outpatient for suspected pneumonia. She was seen in the ED and admitted for covid-19 infection, subsequently transferred to [facility] on 5/17.   Assessment & Plan: Principal Problem:   COVID-19 Active Problems:   Hyperlipidemia   Refusal of blood transfusions as patient is Jehovah's Witness   History of adenocarcinoma of breast   Aortic stenosis s/p Tissure AVR 2006   CAD (coronary artery disease)   Hypertension   Acute on chronic renal failure (HCC)   HOCM (hypertrophic obstructive cardiomyopathy) (HCC)   Peripheral arterial disease (HCC)   Chronic diastolic CHF (congestive heart failure) ([place])  Covid-19 viral pneumonia:  - Continue airborne, contact precautions. PPE including surgical gown, gloves, face shield, cap, shoe covers, and N-95 used during this encounter in a negative pressure room.  - Check daily labs: CBC w/diff, CMP, d-dimer, fibrinogen, ferritin, LDH, CRP. Inflammatory labs are worsening today though subjectively improved. Discussed actemra at length with the patient and her daughter. Currently will hold off, though reevaluate frequently for clinical worsening to not delay antiinflammatory Tx.  - Enoxaparin prophylactic dose, 30mg  as CrCl < 44ml/min. - Blood cultures drawn on admission. PCT low, No infiltrate on CXR, agree with stopping abx. - Maintain euvolemia/net negative.  - Avoid NSAIDs - Recommend proning and aggressive use of incentive spirometry - Low threshold to start steroids/actemra if infiltrates develop or hypoxia worsens.  Weakness:  Nonfocal, negative CT head. Suspect due to covid infection.  - PT/OT, mobilize as much as possible. Will need rehab.   T2DM: HbA1c 6.9% indicating good control.  - Decrease longacting insulin with low blood sugar this AM. Continue SSI, holding metformin.   AKI on stage III CKD:  - Continue holding diuretic and gave IVF. BNP 902, though this has been chronically elevated.  - Monitor UOP, I/O  Chronic HFpEF, HOCM, CAD s/p CABG, AS s/p AVR 2006:  - Continue beta blocker, ASA, statin - Monitor I/O, weights  Prolonged QTc:  - Magnesium supplementation as below.  - Continue telemetry.  - Avoid azithromycin and other provocative agents.   Hypomagnesemia:  - Supplement and recheck in AM  Anemia of chronic disease: Hgb stable 10-11.  - Not patient is Jehovah's Witness and would not consent to blood products were they to be indicated.   DVT prophylaxis: Lovenox Code Status: Full confirmed earlier in hospitalization Family Communication: None at bedside, discussed with daughter by phone Disposition Plan: ALF once improving, prognosis is guarded  Consultants:   None  Procedures:   None  Antimicrobials:  None   Subjective: States she feels better, slightly less weak today. No dyspnea or chest pain. Would rather not take any investigational medications at this time.   Objective: Vitals:   07/12/18 2123 07/12/18 2300 07/13/18 0516 07/13/18 0800  BP: 133/70   133/76  Pulse: 76 79  79  Resp: 16 17  15   Temp: 98.4 F (36.9 C)  98.5 F (36.9 C) 98.1 F (36.7 C)  TempSrc: Axillary  Oral   SpO2: 100% 99%  98%  Weight:      Height:  Intake/Output Summary (Last 24 hours) at 07/13/2018 1423 Last data filed at 07/13/2018 0151 Gross per 24 hour  Intake -  Output 75 ml  Net -75 ml   Filed Weights   07/10/18 1000  Weight: 76.2 kg   Gen: 73 y.o. female in no distress Pulm: Nonlabored breathing. Clear. CV: Regular rate and rhythm. III/VI SEM at base, no other murmur,  rub, or gallop. No JVD, trace dependent edema. GI: Abdomen soft, non-tender, non-distended, with normoactive bowel sounds.  Ext: Warm, no deformities Skin: No rashes, lesions or ulcers on visualized skin. Neuro: Alert and oriented. Extremely diffusely weak, without focal neurological deficits. Psych: Judgement and insight appear fair. Mood euthymic & affect congruent. Behavior is appropriate.    Data Reviewed: I have personally reviewed following labs and imaging studies  CBC: Recent Labs  Lab 07/10/18 1025 07/11/18 0631 07/12/18 0316 07/13/18 0334  WBC 7.7 7.8 9.7 11.1*  NEUTROABS 6.1 6.2 8.0* 9.3*  HGB 10.8* 10.3* 10.2* 10.2*  HCT 33.6* 31.2* 31.0* 31.6*  MCV 92.3 91.2 90.9 90.8  PLT 194 207 233 242   Basic Metabolic Panel: Recent Labs  Lab 07/10/18 1025 07/11/18 0631 07/11/18 2304 07/12/18 0316 07/13/18 0334  NA 137 137 137  --  139  K 3.7 4.0 3.9  --  4.1  CL 106 103 105  --  107  CO2 19* 20* 21*  --  22  GLUCOSE 69* 118* 106*  --  62*  BUN 37* 40* 45*  --  54*  CREATININE 2.94* 2.91* 2.64*  --  2.52*  CALCIUM 8.2* 8.2* 7.9*  --  8.1*  MG  --  1.6*  --  1.6*  --   PHOS  --   --   --  4.3 4.4   GFR: Estimated Creatinine Clearance: 20.4 mL/min (A) (by C-G formula based on SCr of 2.52 mg/dL (H)). Liver Function Tests: Recent Labs  Lab 07/10/18 1025 07/11/18 0631 07/11/18 2304 07/13/18 0334  AST $Re'20 23 25 'VLn$ 36  ALT $Re'13 12 14 19  'WqX$ ALKPHOS 69 71 68 77  BILITOT 0.6 0.6 0.2* 0.4  PROT 5.9* 5.8* 5.8* 5.9*  ALBUMIN 2.6* 2.3* 2.3* 2.2*   No results for input(s): LIPASE, AMYLASE in the last 168 hours. No results for input(s): AMMONIA in the last 168 hours. Coagulation Profile: No results for input(s): INR, PROTIME in the last 168 hours. Cardiac Enzymes: Recent Labs  Lab 07/10/18 1025 07/11/18 2304 07/12/18 0316 07/13/18 0334  CKTOTAL  --   --  170 116  TROPONINI 0.06* 0.07*  --   --    BNP (last 3 results) No results for input(s): PROBNP in the last 8760  hours. HbA1C: Recent Labs    07/11/18 0631  HGBA1C 6.9*   CBG: Recent Labs  Lab 07/12/18 1218 07/12/18 1557 07/12/18 2122 07/13/18 0811 07/13/18 1217  GLUCAP 94 91 76 54* 110*   Lipid Profile: Recent Labs    07/12/18 0316  CHOL 67  HDL 14*  LDLCALC 24  TRIG 144  CHOLHDL 4.8   Thyroid Function Tests: No results for input(s): TSH, T4TOTAL, FREET4, T3FREE, THYROIDAB in the last 72 hours. Anemia Panel: Recent Labs    07/12/18 0316 07/13/18 0334  FERRITIN 284 423*   Urine analysis:    Component Value Date/Time   COLORURINE AMBER (A) 07/11/2018 1826   APPEARANCEUR HAZY (A) 07/11/2018 1826   LABSPEC 1.020 07/11/2018 1826   PHURINE 5.0 07/11/2018 1826   GLUCOSEU NEGATIVE 07/11/2018 1826  GLUCOSEU NEG mg/dL 12/71/0414 1205   HGBUR NEGATIVE 07/11/2018 1826   HGBUR trace-lysed 03/05/2010 0856   BILIRUBINUR NEGATIVE 07/11/2018 1826   BILIRUBINUR negative 11/11/2011 1701   KETONESUR NEGATIVE 07/11/2018 1826   PROTEINUR >=300 (A) 07/11/2018 1826   UROBILINOGEN 0.2 11/11/2011 1701   UROBILINOGEN 0.2 11/11/2011 1645   NITRITE NEGATIVE 07/11/2018 1826   LEUKOCYTESUR SMALL (A) 07/11/2018 1826   Recent Results (from the past 240 hour(s))  Blood Culture (routine x 2)     Status: None (Preliminary result)   Collection Time: 07/10/18 10:25 AM  Result Value Ref Range Status   Specimen Description BLOOD RIGHT HAND  Final   Special Requests   Final    BOTTLES DRAWN AEROBIC AND ANAEROBIC Blood Culture results may not be optimal due to an inadequate volume of blood received in culture bottles   Culture   Final    NO GROWTH 3 DAYS Performed at [facility], [address], [place], [place] [address]    Report Status PENDING  Incomplete  SARS Coronavirus 2 (CEPHEID- Performed in [facility] hospital lab), Hosp Order     Status: Abnormal   Collection Time: 07/10/18 10:30 AM  Result Value Ref Range Status   SARS Coronavirus 2 POSITIVE (A) NEGATIVE Final    Comment:  CRITICAL RESULT CALLED TO, READ BACK BY AND VERIFIED WITH: RN CONNIE V. 1149 [id number] (NOTE) If result is NEGATIVE SARS-CoV-2 target nucleic acids are NOT DETECTED. The SARS-CoV-2 RNA is generally detectable in upper and lower  respiratory specimens during the acute phase of infection. The lowest  concentration of SARS-CoV-2 viral copies this assay can detect is 250  copies / mL. A negative result does not preclude SARS-CoV-2 infection  and should not be used as the sole basis for treatment or other  patient management decisions.  A negative result may occur with  improper specimen collection / handling, submission of specimen other  than nasopharyngeal swab, presence of viral mutation(s) within the  areas targeted by this assay, and inadequate number of viral copies  (<250 copies / mL). A negative result must be combined with clinical  observations, patient history, and epidemiological information. If result is POSITIVE SARS-CoV-2 target nucleic acids are DETECTED.  The SARS-CoV-2 RNA is generally detectable in upper and lower  respiratory specimens during the acute phase of infection.  Positive  results are indicative of active infection with SARS-CoV-2.  Clinical  correlation with patient history and other diagnostic information is  necessary to determine patient infection status.  Positive results do  not rule out bacterial infection or co-infection with other viruses. If result is PRESUMPTIVE POSTIVE SARS-CoV-2 nucleic acids MAY BE PRESENT.   A presumptive positive result was obtained on the submitted specimen  and confirmed on repeat testing.  While 2019 novel coronavirus  (SARS-CoV-2) nucleic acids may be present in the submitted sample  additional confirmatory testing may be necessary for epidemiological  and / or clinical management purposes  to differentiate between  SARS-CoV-2 and other Sarbecovirus currently known to infect humans.  If clinically indicated additional  testing with an alternate test  methodology [phone]) i s advised. The SARS-CoV-2 RNA is generally  detectable in upper and lower respiratory specimens during the acute  phase of infection. The expected result is Negative. Fact Sheet for Patients:  BoilerBrush.com.cy Fact Sheet for Healthcare Providers: https://pope.com/ This test is not yet approved or cleared by the [place] FDA and has been authorized for detection and/or diagnosis  of SARS-CoV-2 by FDA under an Emergency Use Authorization (EUA).  This EUA will remain in effect (meaning this test can be used) for the duration of the COVID-19 declaration under Section 564(b)(1) of the Act, 21 U.S.C. section 360bbb-3(b)(1), unless the authorization is terminated or revoked sooner. Performed at [facility], [place] [address] [place], [place] [address]   Blood Culture (routine x 2)     Status: None (Preliminary result)   Collection Time: 07/10/18 10:56 AM  Result Value Ref Range Status   Specimen Description BLOOD RIGHT FOREARM  Final   Special Requests   Final    BOTTLES DRAWN AEROBIC AND ANAEROBIC Blood Culture adequate volume   Culture   Final    NO GROWTH 3 DAYS Performed at [facility], [place] [address], [place], [place] [address]    Report Status PENDING  Incomplete      Radiology Studies: No results found.  Scheduled Meds: . aspirin EC  81 mg Oral Daily  . atorvastatin  80 mg Oral QHS  . enoxaparin (LOVENOX) injection  30 mg Subcutaneous Q24H  . insulin aspart  0-9 Units Subcutaneous TID WC  . insulin glargine  10 Units Subcutaneous QHS  . metoprolol succinate  25 mg Oral Daily  . PARoxetine  30 mg Oral Daily  . senna-docusate  1 tablet Oral QHS  . sodium chloride flush  3 mL Intravenous Q12H  . vitamin C  250 mg Oral BID  . zinc sulfate  220 mg Oral Daily   Continuous Infusions:    LOS: 3 days   Time spent: 25 minutes.  Patrecia Pour, MD [facility] [email or web address] Password [facility] 07/13/2018, 2:23 PM

## 2018-07-13 NOTE — Evaluation (Signed)
Physical Therapy Evaluation Patient Details Name: [name] MRN: [id number] DOB: [birth year] Today's Date: 07/13/2018   History of Present Illness  [name] is a 73 y.o. female with a history of breast CA s/p mastectomy, chemotherapy, HOCM, chornic HFpEF, mod AS s/p bioprosthetic AVR, T2DM, stage III CKD, and hx CVA who presented from ALF with 6 days of fever, myalgias and fatigue despite taking levaquin as prescribed by MD as outpatient for suspected pneumonia. She was seen in the ED and admitted for covid-19 infection, subsequently transferred to [facility] on 5/17.   Clinical Impression   The patient is very rigid in extremities, decreased ability to keep knees flexed when sitting on bed edge. Based on patient's daughter, patient was ambulatory short distances. Patient is clearly not at baseline. Pt admitted with above diagnosis. Pt currently with functional limitations due to the deficits listed below (see PT Problem List).  Pt will benefit from skilled PT to increase their independence and safety with mobility to allow discharge to the venue listed below.       Follow Up Recommendations SNF    Equipment Recommendations  None recommended by PT    Recommendations for Other Services       Precautions / Restrictions Precautions Precautions: Fall Precaution Comments: limited B shoulder ROM, legs are held stiffly in extension      Mobility  Bed Mobility Overal bed mobility: Needs Assistance Bed Mobility: Supine to Sit     Supine to sit: +2 for physical assistance;Max assist     General bed mobility comments: minimal initiation of movement to progress to EOB  Transfers Overall transfer level: Needs assistance Equipment used: Rolling walker (2 wheeled) Transfers: Sit to/from Stand Sit to Stand: Max assist;+2 physical assistance;From elevated surface         General transfer comment: unable to achieve full upright standing, feet forward, posterior lean. Attempted x  2  Ambulation/Gait             General Gait Details: unable  Stairs            Wheelchair Mobility    Modified Rankin (Stroke Patients Only)       Balance Overall balance assessment: Needs assistance Sitting-balance support: Feet supported Sitting balance-Leahy Scale: Poor Sitting balance - Comments: posteiror lean; unable ot maintain feet on floor     Standing balance-Leahy Scale: Zero                               Pertinent Vitals/Pain Pain Assessment: Faces Faces Pain Scale: Hurts little more Pain Location: general discomfort Pain Descriptors / Indicators: Discomfort;Guarding Pain Intervention(s): Limited activity within patient's tolerance;Monitored during session    Home Living Family/patient expects to be discharged to:: Assisted living                 Additional Comments: Unsure if they have SNF level of care    Prior Function Level of Independence: Needs assistance   Gait / Transfers Assistance Needed: Pt used lift chair to stand then was able t walk to bathroom and toilet self  ADL's / Homemaking Assistance Needed: pt able to complete ADL tasks; staff assisted with showers        Hand Dominance   Dominant Hand: Right    Extremity/Trunk Assessment   Upper Extremity Assessment Upper Extremity Assessment: Defer to OT evaluation RUE Deficits / Details: difficulty reaching mouth with hand; only around 10-20 degrees shoulder  FF - baesline deficits RUE Coordination: decreased fine motor;decreased gross motor LUE Deficits / Details: stronger than R but limited shoulder ROM at baleine LUE Coordination: decreased fine motor;decreased gross motor    Lower Extremity Assessment Lower Extremity Assessment: RLE deficits/detail;LLE deficits/detail RLE Deficits / Details: legs are  very rigid and difficult to  flex. KneesDid flex more when sitting on bed edge and gravity assisted with therapists supporting the feet to keep knees  flexed LLE Deficits / Details: same as right    Cervical / Trunk Assessment Cervical / Trunk Assessment: Kyphotic  Communication   Communication: (voice is quiet but appropriate)  Cognition Arousal/Alertness: Awake/alert Behavior During Therapy: Flat affect Overall Cognitive Status: Impaired/Different from baseline Area of Impairment: Orientation;Attention;Memory;Following commands;Safety/judgement;Awareness;Problem solving                 Orientation Level: Disoriented to;Time;Situation Current Attention Level: Sustained Memory: Decreased recall of precautions;Decreased short-term memory Following Commands: Follows one step commands with increased time Safety/Judgement: Decreased awareness of safety;Decreased awareness of deficits Awareness: Intellectual Problem Solving: Slow processing;Decreased initiation;Difficulty sequencing;Requires verbal cues;Requires tactile cues General Comments: very slow processing; this appears to be a decline as compared to her basline per conservation with her daughter      General Comments      Exercises     Assessment/Plan    PT Assessment Patient needs continued PT services  PT Problem List Decreased strength;Decreased range of motion;Decreased cognition;Decreased activity tolerance;Decreased knowledge of use of DME;Decreased knowledge of precautions;Decreased mobility;Decreased safety awareness       PT Treatment Interventions DME instruction;Therapeutic exercise;Gait training;Functional mobility training;Therapeutic activities;Patient/family education;Cognitive remediation;Balance training    PT Goals (Current goals can be found in the Care Plan section)  Acute Rehab PT Goals Patient Stated Goal: to get stronger PT Goal Formulation: With patient/family Time For Goal Achievement: 07/27/18 Potential to Achieve Goals: Fair    Frequency Min 2X/week   Barriers to discharge        Co-evaluation PT/OT/SLP  Co-Evaluation/Treatment: Yes Reason for Co-Treatment: Complexity of the patient's impairments (multi-system involvement);For patient/therapist safety PT goals addressed during session: Mobility/safety with mobility OT goals addressed during session: ADL's and self-care       AM-PAC PT "6 Clicks" Mobility  Outcome Measure Help needed turning from your back to your side while in a flat bed without using bedrails?: Total Help needed moving from lying on your back to sitting on the side of a flat bed without using bedrails?: Total Help needed moving to and from a bed to a chair (including a wheelchair)?: Total Help needed standing up from a chair using your arms (e.g., wheelchair or bedside chair)?: Total Help needed to walk in hospital room?: Total Help needed climbing 3-5 steps with a railing? : Total 6 Click Score: 6    End of Session Equipment Utilized During Treatment: Gait belt Activity Tolerance: Patient limited by fatigue Patient left: in bed;with call bell/phone within reach;with bed alarm set   PT Visit Diagnosis: Unsteadiness on feet (R26.81);Muscle weakness (generalized) (M62.81)    Time: 7564-3329 PT Time Calculation (min) (ACUTE ONLY): 30 min   Charges:   PT Evaluation $PT Eval Moderate Complexity: [place] Pager [phone] Office [phone]   Claretha Cooper 07/13/2018, 2:38 PM

## 2018-07-13 NOTE — Progress Notes (Signed)
Occupational Therapy Evaluation Patient Details Name: [name] MRN: [id number] DOB: June 20, 1945 Today's Date: 07/13/2018    History of Present Illness [name] is a 73 y.o. female with a history of breast CA s/p mastectomy, chemotherapy, HOCM, chornic HFpEF, mod AS s/p bioprosthetic AVR, T2DM, stage III CKD, and hx CVA who presented from ALF with 6 days of fever, myalgias and fatigue despite taking levaquin as prescribed by MD as outpatient for suspected pneumonia. She was seen in the ED and admitted for covid-19 infection, subsequently transferred to [facility] on 5/17.    Clinical Impression   PTA, pt lived at [facility].Pt used a lift chair to stand and mobilized around her room @ RW level independently. Pt was able to walk to her bathroom and complete her ADL tasks with the exception of assistance for showering. Pt demonstrates a significant functional status change. Attempted to mobilize @ RW level. Pt unable to stand with Max A +2 and will need to mobilize to chair with Maximove. Pt unable to complete hand ot mouth pattern due to weakness and will require assistance with self feeding. Discussed need for rehab with pt and pt's daughter [name]. Unable to progress OOB to chair as there are not any maximove pads on the unit. Nsg notified. Will follow acutely.  HR 81; BP 133/76; SpO2 94 0 95 RA    Follow Up Recommendations  SNF;Supervision/Assistance - 24 hour    Equipment Recommendations  None recommended by OT    Recommendations for Other Services       Precautions / Restrictions Precautions Precautions: Fall Precaution Comments: limited B shoulder ROM      Mobility Bed Mobility Overal bed mobility: Needs Assistance Bed Mobility: Supine to Sit     Supine to sit: +2 for physical assistance;Max assist     General bed mobility comments: minimal initiation of movement to progress to EOB  Stiff BLE; Appeasr to hav3e difficulty processing task  Transfers Overall  transfer level: Needs assistance Equipment used: Rolling walker (2 wheeled) Transfers: Sit to/from Stand Sit to Stand: Max assist;+2 physical assistance;From elevated surface         General transfer comment: unable to achieve full upright standing; kyphotic; forward head    Balance Overall balance assessment: Needs assistance   Sitting balance-Leahy Scale: Poor Sitting balance - Comments: posteiror lean; unable ot maintain feet on floor     Standing balance-Leahy Scale: Zero                             ADL either performed or assessed with clinical judgement   ADL Overall ADL's : Needs assistance/impaired Eating/Feeding: Maximal assistance;Sitting   Grooming: Maximal assistance;Sitting   Upper Body Bathing: Maximal assistance;Sitting   Lower Body Bathing: Total assistance;Bed level   Upper Body Dressing : Total assistance;Sitting   Lower Body Dressing: Total assistance;Bed level               Functional mobility during ADLs: +2 for physical assistance;Rolling walker;Maximal assistance General ADL Comments: Attempted to stand with elevated bed. Pt unable to stand fully upright/B knees limited with ROM; only able to stand a few seconds before having to sit back down.     Vision Baseline Vision/History: Wears glasses Wears Glasses: Reading only       Perception     Praxis      Pertinent Vitals/Pain Pain Assessment: Faces Faces Pain Scale: Hurts little more Pain Location: general  discomfort Pain Descriptors / Indicators: Discomfort;Guarding Pain Intervention(s): Limited activity within patient's tolerance;Repositioned     Hand Dominance Right   Extremity/Trunk Assessment Upper Extremity Assessment Upper Extremity Assessment: Generalized weakness;RUE deficits/detail;LUE deficits/detail RUE Deficits / Details: difficulty reaching mouth with hand; only around 10-20 degrees shoulder FF - baesline deficits RUE Coordination: decreased fine  motor;decreased gross motor LUE Deficits / Details: stronger than R but limited shoulder ROM at baleine LUE Coordination: decreased fine motor;decreased gross motor   Lower Extremity Assessment Lower Extremity Assessment: Defer to PT evaluation(Difficulty with B knee flexion;? baeline)   Cervical / Trunk Assessment Cervical / Trunk Assessment: Kyphotic   Communication     Cognition Arousal/Alertness: Awake/alert Behavior During Therapy: Flat affect Overall Cognitive Status: Impaired/Different from baseline Area of Impairment: Orientation;Attention;Memory;Following commands;Safety/judgement;Awareness;Problem solving                 Orientation Level: Disoriented to;Time;Situation Current Attention Level: Sustained Memory: Decreased recall of precautions;Decreased short-term memory Following Commands: Follows one step commands with increased time Safety/Judgement: Decreased awareness of safety;Decreased awareness of deficits Awareness: Intellectual Problem Solving: Slow processing;Decreased initiation;Difficulty sequencing;Requires verbal cues;Requires tactile cues General Comments: very slow processing; this appeasr to be a decline as compared to her basleine per conservation with her daughter   General Comments       Exercises     Shoulder Instructions      Home Living Family/patient expects to be discharged to:: Assisted living                                 Additional Comments: Unsure if they have SNF level of care      Prior Functioning/Environment Level of Independence: Needs assistance  Gait / Transfers Assistance Needed: Pt used lift chair to stand then was able t walk to bathroom and toilet self ADL's / Homemaking Assistance Needed: pt able to complete ADL tasks; staff assisted with showers            OT Problem List: Decreased strength;Decreased range of motion;Decreased activity tolerance;Impaired balance (sitting and/or  standing);Decreased coordination;Decreased cognition;Decreased safety awareness;Decreased knowledge of use of DME or AE;Cardiopulmonary status limiting activity;Pain;Impaired UE functional use      OT Treatment/Interventions: Self-care/ADL training;Therapeutic exercise;Neuromuscular education;Energy conservation;DME and/or AE instruction;Therapeutic activities;Cognitive remediation/compensation;Patient/family education;Balance training    OT Goals(Current goals can be found in the care plan section) Acute Rehab OT Goals Patient Stated Goal: to get stronger OT Goal Formulation: With patient/family Time For Goal Achievement: 07/27/18 Potential to Achieve Goals: Good  OT Frequency: Min 2X/week   Barriers to D/C:            Co-evaluation PT/OT/SLP Co-Evaluation/Treatment: Yes Reason for Co-Treatment: Complexity of the patient's impairments (multi-system involvement);For patient/therapist safety;To address functional/ADL transfers   OT goals addressed during session: ADL's and self-care;Strengthening/ROM      AM-PAC OT "6 Clicks" Daily Activity     Outcome Measure Help from another person eating meals?: A Lot Help from another person taking care of personal grooming?: A Lot Help from another person toileting, which includes using toliet, bedpan, or urinal?: Total Help from another person bathing (including washing, rinsing, drying)?: A Lot Help from another person to put on and taking off regular upper body clothing?: A Lot Help from another person to put on and taking off regular lower body clothing?: Total 6 Click Score: 10   End of Session Equipment Utilized During Treatment: Gait belt;Rolling walker Nurse Communication: Mobility  status;Need for lift equipment  Activity Tolerance: Patient tolerated treatment well Patient left: in bed;with call bell/phone within reach;with bed alarm set  OT Visit Diagnosis: Other abnormalities of gait and mobility (R26.89);Muscle weakness  (generalized) (M62.81);Other symptoms and signs involving cognitive function;Pain Pain - part of body: Shoulder;Knee                Time: 2241-1464 OT Time Calculation (min): 40 min Charges:  OT General Charges $OT Visit: 1 Visit OT Evaluation $OT Eval Moderate Complexity: 1 Mod OT Treatments $Self Care/Home Management : 8-22 mins  Maurie Boettcher, OT/L   Acute OT Clinical Specialist [place] Pager [phone] Office [phone]   [facility] 07/13/2018, 2:27 PM

## 2018-07-13 NOTE — Progress Notes (Signed)
OT Treatment Note  Pt seen second session to mobilize out of bed with use of Maximove. Max A +2 with rolling.improved ability to self feed once OOB but will need assistance. More alert and conversational when in chair. Will need rehab at [facility] Will follow acutely to facilitate DC to SNF.     07/13/18 1437  OT Visit Information  Last OT Received On 07/13/18  Assistance Needed +2  History of Present Illness [name] is a 73 y.o. female with a history of breast CA s/p mastectomy, chemotherapy, HOCM, chornic HFpEF, mod AS s/p bioprosthetic AVR, T2DM, stage III CKD, and hx CVA who presented from ALF with 6 days of fever, myalgias and fatigue despite taking levaquin as prescribed by MD as outpatient for suspected pneumonia. She was seen in the ED and admitted for covid-19 infection, subsequently transferred to [facility] on 5/17.   Precautions  Precautions Fall  Precaution Comments limited B shoulder ROM, legs are held stiffly in extension  Pain Assessment  Pain Assessment Faces  Faces Pain Scale 4  Pain Location general discomfort  Pain Descriptors / Indicators Discomfort;Guarding  Pain Intervention(s) Limited activity within patient's tolerance;Repositioned  Cognition  Arousal/Alertness Awake/alert  Behavior During Therapy Flat affect  Overall Cognitive Status Impaired/Different from baseline  Area of Impairment Orientation;Attention;Memory;Following commands;Safety/judgement;Awareness;Problem solving  Orientation Level Disoriented to;Time;Situation  Current Attention Level Sustained  Memory Decreased recall of precautions;Decreased short-term memory  Following Commands Follows one step commands with increased time  Safety/Judgement Decreased awareness of safety;Decreased awareness of deficits  Awareness Intellectual  Problem Solving Slow processing;Decreased initiation;Difficulty sequencing;Requires verbal cues;Requires tactile cues  General Comments very slow processing; this appears to  be a decline as compared to her basline per conservation with her daughter  ADL  General ADL Comments increased ability to self feed once up in chair  Bed Mobility  Overal bed mobility Needs Assistance  Bed Mobility Rolling  Rolling Max assist;+2 for physical assistance  General bed mobility comments minimal initiation of rolling; physical assist to bend knees and help with reaching to place pad underneath  Balance  Sitting balance-Leahy Scale Poor  Transfers  Transfer via Lift Equipment Maximove  OT - End of Session  Activity Tolerance Patient tolerated treatment well  Patient left in chair;with call bell/phone within reach;with chair alarm set  Nurse Communication Mobility status;Need for lift equipment Genia [place])  OT Assessment/Plan  OT Plan Discharge plan remains appropriate  OT Visit Diagnosis Other abnormalities of gait and mobility (R26.89);Muscle weakness (generalized) (M62.81);Other symptoms and signs involving cognitive function;Pain  Pain - part of body  (general discomfort)  OT Frequency (ACUTE ONLY) Min 2X/week  Follow Up Recommendations SNF;Supervision/Assistance - 24 hour  OT Equipment None recommended by OT  AM-PAC OT "6 Clicks" Daily Activity Outcome Measure (Version 2)  Help from another person eating meals? 2  Help from another person taking care of personal grooming? 2  Help from another person toileting, which includes using toliet, bedpan, or urinal? 1  Help from another person bathing (including washing, rinsing, drying)? 2  Help from another person to put on and taking off regular upper body clothing? 2  Help from another person to put on and taking off regular lower body clothing? 1  6 Click Score 10  OT Goal Progression  Progress towards OT goals Progressing toward goals  Acute Rehab OT Goals  Patient Stated Goal to get stronger  OT Goal Formulation With patient/family  Time For Goal Achievement 07/27/18  Potential to Achieve Goals Good  ADL Goals   Pt Will Perform Eating with set-up;with supervision;with adaptive utensils;sitting  Pt Will Perform Grooming with min assist;sitting  Pt Will Transfer to Toilet with mod assist;bedside commode;stand pivot transfer  Additional ADL Goal #1 Sit EOB with minguard A in preparation for ADL tasks  OT Time Calculation  OT Start Time (ACUTE ONLY) 1140  OT Stop Time (ACUTE ONLY) 1159  OT Time Calculation (min) 19 min  OT General Charges  $OT Visit 1 Visit  OT Treatments  $Self Care/Home Management  8-22 mins  Maurie Boettcher, OT/L   Acute OT Clinical Specialist [place] Pager [phone] Office [phone]

## 2018-07-13 NOTE — Progress Notes (Signed)
CardioVascular Research Department and AHF Team  ReDS Research Project   Patient #: [id number]  ReDS Measurement  Right: 46 %  Left: 29 %

## 2018-07-14 LAB — CBC WITH DIFFERENTIAL/PLATELET
Abs Immature Granulocytes: 0.16 10*3/uL — ABNORMAL HIGH (ref 0.00–0.07)
Basophils Absolute: 0 10*3/uL (ref 0.0–0.1)
Basophils Relative: 0 %
Eosinophils Absolute: 0.1 10*3/uL (ref 0.0–0.5)
Eosinophils Relative: 2 %
HCT: 32.7 % — ABNORMAL LOW (ref 36.0–46.0)
Hemoglobin: 10.5 g/dL — ABNORMAL LOW (ref 12.0–15.0)
Immature Granulocytes: 2 %
Lymphocytes Relative: 15 %
Lymphs Abs: 1.2 10*3/uL (ref 0.7–4.0)
MCH: 29.4 pg (ref 26.0–34.0)
MCHC: 32.1 g/dL (ref 30.0–36.0)
MCV: 91.6 fL (ref 80.0–100.0)
Monocytes Absolute: 0.7 10*3/uL (ref 0.1–1.0)
Monocytes Relative: 8 %
Neutro Abs: 5.8 10*3/uL (ref 1.7–7.7)
Neutrophils Relative %: 73 %
Platelets: 312 10*3/uL (ref 150–400)
RBC: 3.57 MIL/uL — ABNORMAL LOW (ref 3.87–5.11)
RDW: 13.5 % (ref 11.5–15.5)
WBC: 8 10*3/uL (ref 4.0–10.5)
nRBC: 0 % (ref 0.0–0.2)

## 2018-07-14 LAB — COMPREHENSIVE METABOLIC PANEL
ALT: 29 U/L (ref 0–44)
AST: 47 U/L — ABNORMAL HIGH (ref 15–41)
Albumin: 2.3 g/dL — ABNORMAL LOW (ref 3.5–5.0)
Alkaline Phosphatase: 82 U/L (ref 38–126)
Anion gap: 10 (ref 5–15)
BUN: 60 mg/dL — ABNORMAL HIGH (ref 8–23)
CO2: 22 mmol/L (ref 22–32)
Calcium: 8.2 mg/dL — ABNORMAL LOW (ref 8.9–10.3)
Chloride: 105 mmol/L (ref 98–111)
Creatinine, Ser: 2.47 mg/dL — ABNORMAL HIGH (ref 0.44–1.00)
GFR calc Af Amer: 22 mL/min — ABNORMAL LOW (ref >60)
GFR calc non Af Amer: 19 mL/min — ABNORMAL LOW (ref >60)
Glucose, Bld: 98 mg/dL (ref 70–99)
Potassium: 4 mmol/L (ref 3.5–5.1)
Sodium: 137 mmol/L (ref 135–145)
Total Bilirubin: 0.4 mg/dL (ref 0.3–1.2)
Total Protein: 6.1 g/dL — ABNORMAL LOW (ref 6.5–8.1)

## 2018-07-14 LAB — GLUCOSE, CAPILLARY
Glucose-Capillary: 86 mg/dL (ref 70–99)
Glucose-Capillary: 86 mg/dL (ref 70–99)
Glucose-Capillary: 120 mg/dL — ABNORMAL HIGH (ref 70–99)
Glucose-Capillary: 109 mg/dL — ABNORMAL HIGH (ref 70–99)

## 2018-07-14 LAB — MAGNESIUM: Magnesium: 2.3 mg/dL (ref 1.7–2.4)

## 2018-07-14 LAB — D-DIMER, QUANTITATIVE: D-Dimer, Quant: 1.77 ug/mL-FEU — ABNORMAL HIGH (ref 0.00–0.50)

## 2018-07-14 LAB — CK: Total CK: 77 U/L (ref 38–234)

## 2018-07-14 LAB — FERRITIN: Ferritin: 432 ng/mL — ABNORMAL HIGH (ref 11–307)

## 2018-07-14 LAB — INTERLEUKIN-6, PLASMA: Interleukin-6, Plasma: 378.8 pg/mL — ABNORMAL HIGH (ref 0.0–12.2)

## 2018-07-14 LAB — C-REACTIVE PROTEIN: CRP: 11.8 mg/dL — ABNORMAL HIGH (ref <1.0)

## 2018-07-14 LAB — PHOSPHORUS: Phosphorus: 4.5 mg/dL (ref 2.5–4.6)

## 2018-07-14 NOTE — Progress Notes (Signed)
Physical Therapy Treatment Patient Details Name: [name] MRN: [id number] DOB: 08/31/1945 Today's Date: 07/14/2018    History of Present Illness [name] is a 73 y.o. female with a history of breast CA s/p mastectomy, chemotherapy, HOCM, chornic HFpEF, mod AS s/p bioprosthetic AVR, T2DM, stage III CKD, and hx CVA who presented from ALF with 6 days of fever, myalgias and fatigue despite taking levaquin as prescribed by MD as outpatient for suspected pneumonia. She was seen in the ED and admitted for covid-19 infection, subsequently transferred to [facility] on 5/17.     PT Comments    Pt was able to stand with significant +2 assist and RW from recliner chair, but we were unable to trasnfer to [facility] or back to bed safely this way, so maxi move total lift was used to get to [facility] and then back to bed.  I would like to try steady standing frame next session as her knees will bend with pressure and have a flexible end feel (I can push them towards 90 degrees of flexion without her grimacing).  She remains appropriate for SNF level rehab at discharge.     HR81 94% O2 RA 133/76 BP prior to mobility  Follow Up Recommendations  SNF     Equipment Recommendations  None recommended by PT    Recommendations for Other Services   NA     Precautions / Restrictions Precautions Precautions: Fall Precaution Comments: limited B shoulder ROM, legs are held stiffly in extension, L >R     Mobility  Bed Mobility Overal bed mobility: Needs Assistance Bed Mobility: Sit to Supine       Sit to supine: Max assist;+2 for physical assistance   General bed mobility comments: Two person max assist to support trunk and legs when going from sitting to supine.   Transfers Overall transfer level: Needs assistance Equipment used: Rolling walker (2 wheeled) Transfers: Sit to/from Stand Sit to Stand: +2 physical assistance;Max assist         General transfer comment: Two person max assist to stand  from recliner chair with bil feet blocked and knees flexed as much as she could (left knee is harder to get into flexion than right knee.  Pt pulling on RW, so stabilization applied there and support at her trunk to power up to standing.  Once hands positioned on handles on RW pt was able to extend hips, some manual facilitation to extend knees and bring her feet up under her.  Posterior lean throughout, and unable to safely pivot or take steps forward with RW despite pt saying, "get out of my way so I can get to the bathroom".  Positioned back in sitting and lifted to [facility] and then back to bed with maxi move total lift.          Balance Overall balance assessment: Needs assistance Sitting-balance support: Feet supported;Bilateral upper extremity supported Sitting balance-Leahy Scale: Poor Sitting balance - Comments: posterior lean, min assist EOB and in chair at trunk to keep from posterior LOB.    Standing balance support: Bilateral upper extremity supported Standing balance-Leahy Scale: Poor Standing balance comment: Feet forward of body, max two person assist to maintain standing with support of RW.                             Cognition Arousal/Alertness: Awake/alert Behavior During Therapy: Flat affect Overall Cognitive Status: Impaired/Different from baseline Area of Impairment: Attention;Orientation;Safety/judgement;Awareness;Problem  solving;Memory                 Orientation Level: Disoriented to;Time;Place Current Attention Level: Sustained Memory: Decreased short-term memory Following Commands: Follows one step commands with increased time Safety/Judgement: Decreased awareness of safety;Decreased awareness of deficits Awareness: Intellectual Problem Solving: Slow processing;Decreased initiation;Difficulty sequencing;Requires verbal cues;Requires tactile cues General Comments: Pt wanted to walk to the bathroom, yet has significant difficulty standing.               Pertinent Vitals/Pain Pain Assessment: Faces Faces Pain Scale: No hurt           PT Goals (current goals can now be found in the care plan section) Acute Rehab PT Goals Patient Stated Goal: to go to the bathroom Progress towards PT goals: Progressing toward goals    Frequency    Min 2X/week      PT Plan Current plan remains appropriate       AM-PAC PT "6 Clicks" Mobility   Outcome Measure  Help needed turning from your back to your side while in a flat bed without using bedrails?: Total Help needed moving from lying on your back to sitting on the side of a flat bed without using bedrails?: Total Help needed moving to and from a bed to a chair (including a wheelchair)?: Total Help needed standing up from a chair using your arms (e.g., wheelchair or bedside chair)?: Total Help needed to walk in hospital room?: Total Help needed climbing 3-5 steps with a railing? : Total 6 Click Score: 6    End of Session Equipment Utilized During Treatment: Gait belt Activity Tolerance: Patient tolerated treatment well Patient left: in bed;with call bell/phone within reach;with bed alarm set   PT Visit Diagnosis: Unsteadiness on feet (R26.81);Muscle weakness (generalized) (M62.81)     Time: 2778-2423 PT Time Calculation (min) (ACUTE ONLY): 22 min  Charges:  $Therapeutic Activity: 8-22 mins                    Kourtnee Lahey B. Arlys Scatena, PT, DPT  Acute Rehabilitation [phone] pager #[phone] office   07/14/2018, 6:42 PM

## 2018-07-14 NOTE — Progress Notes (Signed)
CardioVascular Rewsearch Department and AHF Team  ReDS Research Project   Patient #: [id number]  ReDS Measurement  Right: 35%  Left: 27%

## 2018-07-14 NOTE — Progress Notes (Signed)
PROGRESS NOTE  [name]  FVC:[id number] DOB: [birth year] DOA: 07/10/2018 PCP: No primary care provider on file.   Brief Narrative: [name] is a 73 y.o. female with a history of breast CA s/p mastectomy, chemotherapy, HOCM, chornic HFpEF, mod AS s/p bioprosthetic AVR, T2DM, stage III CKD, and hx CVA who presented from ALF with 6 days of fever, myalgias and fatigue despite taking levaquin as prescribed by MD as outpatient for suspected pneumonia. She was seen in the ED and admitted for covid-19 infection, subsequently transferred to [facility] on 5/17.   Assessment & Plan: Principal Problem:   COVID-19 Active Problems:   Hyperlipidemia   Refusal of blood transfusions as patient is Jehovah's Witness   History of adenocarcinoma of breast   Aortic stenosis s/p Tissure AVR 2006   CAD (coronary artery disease)   Hypertension   Acute on chronic renal failure (HCC)   HOCM (hypertrophic obstructive cardiomyopathy) (HCC)   Peripheral arterial disease (HCC)   Chronic diastolic CHF (congestive heart failure) ([place])  Covid-19 viral pneumonia:  - Continue airborne, contact precautions. PPE including surgical gown, gloves, face shield, cap, shoe covers, and N-95 used during this encounter in a negative pressure room.  - Check daily labs: CBC w/diff, CMP, d-dimer, fibrinogen, ferritin, LDH, CRP. Inflammatory markers remain elevated and patient diffusely severely weak. Discussed actemra at length with the patient who prefers to avoid this medication at this time. Will continue to reevaluate frequently for clinical worsening to not delay antiinflammatory Tx.  - Enoxaparin prophylactic dose, 30mg  as CrCl < 27ml/min. - Blood cultures drawn on admission. PCT low, No infiltrate on CXR, agree with stopping abx. - Maintain euvolemia/net negative.  - Avoid NSAIDs - Recommend proning and aggressive use of incentive spirometry - Low threshold to start steroids/actemra if infiltrates develop or hypoxia  worsens.  Weakness: Nonfocal, negative CT head. Suspect due to covid infection.  - PT/OT, mobilize as much as possible. Will need rehab.   T2DM: HbA1c 6.9% indicating good control.  - Decrease longacting insulin with low blood sugar this AM. Continue SSI, holding metformin.   AKI on stage III CKD:  - Continue holding diuretic and gave IVF. BNP 902, though this has been chronically elevated.  - Monitor UOP, I/O  Chronic HFpEF, HOCM, CAD s/p CABG, AS s/p AVR 2006:  - Continue beta blocker, ASA, statin - Monitor I/O, weights  Prolonged QTc: K and Mg wnl. - Magnesium supplementation as below.  - Continue telemetry.  - Avoid azithromycin and other provocative agents.   Hypomagnesemia:  - Supplemented   Anemia of chronic disease: Hgb stable 10-11.  - Not patient is Jehovah's Witness and would not consent to blood products were they to be indicated.  DVT prophylaxis: Lovenox Code Status: Full confirmed earlier in hospitalization Family Communication: None at bedside, discussed with daughter by phone again today. Will decide Re: actemra/steroids, etc. based on next 24 hours clinical progress. Disposition Plan: ALF once improving, prognosis is guarded  Consultants:   None  Procedures:   None  Antimicrobials:  None   Subjective: Weakness is stable, very debilitated, eating poorly. No fevers, denies dyspnea or cough.  Objective: Vitals:   07/14/18 1138 07/14/18 1200 07/14/18 1630 07/14/18 1636  BP:  (!) 161/72    Pulse:  72 66   Resp:  16 14   Temp: 97.9 F (36.6 C)   97.8 F (36.6 C)  TempSrc: Oral   Oral  SpO2:  97% 97%   Weight:  Height:        Intake/Output Summary (Last 24 hours) at 07/14/2018 1738 Last data filed at 07/14/2018 1252 Gross per 24 hour  Intake 423 ml  Output 1103 ml  Net -680 ml   Filed Weights   07/10/18 1000 07/14/18 0500  Weight: 76.2 kg 70.3 kg   Gen: 73 y.o. female in no distress Pulm: Nonlabored breathing room air. Clear. CV:  Regular rate and rhythm. III/VI systolic murmur, rub, or gallop. No JVD, trace dependent edema. GI: Abdomen soft, non-tender, non-distended, with normoactive bowel sounds.  Ext: Warm, no deformities Skin: No rashes, lesions or ulcers on visualized skin. Neuro: Alert. No focal neurological deficits. Psych: Judgement and insight appear fair. Mood euthymic & affect congruent. Behavior is appropriate.    Data Reviewed: I have personally reviewed following labs and imaging studies  CBC: Recent Labs  Lab 07/10/18 1025 07/11/18 0631 07/12/18 0316 07/13/18 0334 07/14/18 0342  WBC 7.7 7.8 9.7 11.1* 8.0  NEUTROABS 6.1 6.2 8.0* 9.3* 5.8  HGB 10.8* 10.3* 10.2* 10.2* 10.5*  HCT 33.6* 31.2* 31.0* 31.6* 32.7*  MCV 92.3 91.2 90.9 90.8 91.6  PLT 194 207 233 278 681   Basic Metabolic Panel: Recent Labs  Lab 07/10/18 1025 07/11/18 0631 07/11/18 2304 07/12/18 0316 07/13/18 0334 07/14/18 0342  NA 137 137 137  --  139 137  K 3.7 4.0 3.9  --  4.1 4.0  CL 106 103 105  --  107 105  CO2 19* 20* 21*  --  22 22  GLUCOSE 69* 118* 106*  --  62* 98  BUN 37* 40* 45*  --  54* 60*  CREATININE 2.94* 2.91* 2.64*  --  2.52* 2.47*  CALCIUM 8.2* 8.2* 7.9*  --  8.1* 8.2*  MG  --  1.6*  --  1.6*  --  2.3  PHOS  --   --   --  4.3 4.4 4.5   GFR: Estimated Creatinine Clearance: 18.7 mL/min (A) (by C-G formula based on SCr of 2.47 mg/dL (H)). Liver Function Tests: Recent Labs  Lab 07/10/18 1025 07/11/18 0631 07/11/18 2304 07/13/18 0334 07/14/18 0342  AST $Re'20 23 25 'Pbv$ 36 47*  ALT $Re'13 12 14 19 29  'gyH$ ALKPHOS 69 71 68 77 82  BILITOT 0.6 0.6 0.2* 0.4 0.4  PROT 5.9* 5.8* 5.8* 5.9* 6.1*  ALBUMIN 2.6* 2.3* 2.3* 2.2* 2.3*   No results for input(s): LIPASE, AMYLASE in the last 168 hours. No results for input(s): AMMONIA in the last 168 hours. Coagulation Profile: No results for input(s): INR, PROTIME in the last 168 hours. Cardiac Enzymes: Recent Labs  Lab 07/10/18 1025 07/11/18 2304 07/12/18 0316 07/13/18  0334 07/14/18 0342  CKTOTAL  --   --  170 116 77  TROPONINI 0.06* 0.07*  --   --   --    BNP (last 3 results) No results for input(s): PROBNP in the last 8760 hours. HbA1C: No results for input(s): HGBA1C in the last 72 hours. CBG: Recent Labs  Lab 07/13/18 1711 07/13/18 2121 07/14/18 0806 07/14/18 1136 07/14/18 1635  GLUCAP 91 103* 86 86 120*   Lipid Profile: Recent Labs    07/12/18 0316  CHOL 67  HDL 14*  LDLCALC 24  TRIG 144  CHOLHDL 4.8   Thyroid Function Tests: No results for input(s): TSH, T4TOTAL, FREET4, T3FREE, THYROIDAB in the last 72 hours. Anemia Panel: Recent Labs    07/13/18 0334 07/14/18 0342  FERRITIN 423* 432*   Urine analysis:  Component Value Date/Time   COLORURINE AMBER (A) 07/11/2018 1826   APPEARANCEUR HAZY (A) 07/11/2018 1826   LABSPEC 1.020 07/11/2018 1826   PHURINE 5.0 07/11/2018 1826   GLUCOSEU NEGATIVE 07/11/2018 1826   GLUCOSEU NEG mg/dL 12/25/2006 2041   HGBUR NEGATIVE 07/11/2018 1826   HGBUR trace-lysed 03/05/2010 0856   BILIRUBINUR NEGATIVE 07/11/2018 1826   BILIRUBINUR negative 11/11/2011 1701   KETONESUR NEGATIVE 07/11/2018 1826   PROTEINUR >=300 (A) 07/11/2018 1826   UROBILINOGEN 0.2 11/11/2011 1701   UROBILINOGEN 0.2 11/11/2011 1645   NITRITE NEGATIVE 07/11/2018 1826   LEUKOCYTESUR SMALL (A) 07/11/2018 1826   Recent Results (from the past 240 hour(s))  Blood Culture (routine x 2)     Status: None (Preliminary result)   Collection Time: 07/10/18 10:25 AM  Result Value Ref Range Status   Specimen Description BLOOD RIGHT HAND  Final   Special Requests   Final    BOTTLES DRAWN AEROBIC AND ANAEROBIC Blood Culture results may not be optimal due to an inadequate volume of blood received in culture bottles   Culture   Final    NO GROWTH 4 DAYS Performed at [facility], [place] [address] [place], [place] [address]    Report Status PENDING  Incomplete  SARS Coronavirus 2 (CEPHEID- Performed in [place] hospital  lab), Hosp Order     Status: Abnormal   Collection Time: 07/10/18 10:30 AM  Result Value Ref Range Status   SARS Coronavirus 2 POSITIVE (A) NEGATIVE Final    Comment: CRITICAL RESULT CALLED TO, READ BACK BY AND VERIFIED WITH: RN CONNIE V. [id number] [id number] [phone] (NOTE) If result is NEGATIVE SARS-CoV-2 target nucleic acids are NOT DETECTED. The SARS-CoV-2 RNA is generally detectable in upper and lower  respiratory specimens during the acute phase of infection. The lowest  concentration of SARS-CoV-2 viral copies this assay can detect is 250  copies / mL. A negative result does not preclude SARS-CoV-2 infection  and should not be used as the sole basis for treatment or other  patient management decisions.  A negative result may occur with  improper specimen collection / handling, submission of specimen other  than nasopharyngeal swab, presence of viral mutation(s) within the  areas targeted by this assay, and inadequate number of viral copies  (<250 copies / mL). A negative result must be combined with clinical  observations, patient history, and epidemiological information. If result is POSITIVE SARS-CoV-2 target nucleic acids are DETECTED.  The SARS-CoV-2 RNA is generally detectable in upper and lower  respiratory specimens during the acute phase of infection.  Positive  results are indicative of active infection with SARS-CoV-2.  Clinical  correlation with patient history and other diagnostic information is  necessary to determine patient infection status.  Positive results do  not rule out bacterial infection or co-infection with other viruses. If result is PRESUMPTIVE POSTIVE SARS-CoV-2 nucleic acids MAY BE PRESENT.   A presumptive positive result was obtained on the submitted specimen  and confirmed on repeat testing.  While 2019 novel coronavirus  (SARS-CoV-2) nucleic acids may be present in the submitted sample  additional confirmatory testing may be necessary for epidemiological  and  / or clinical management purposes  to differentiate between  SARS-CoV-2 and other Sarbecovirus currently known to infect humans.  If clinically indicated additional testing with an alternate test  methodology [phone] i s advised. The SARS-CoV-2 RNA is generally  detectable in upper and lower respiratory specimens during the acute  phase of infection. The  expected result is Negative. Fact Sheet for Patients:  StrictlyIdeas.no Fact Sheet for Healthcare Providers: BankingDealers.co.za This test is not yet approved or cleared by the [place] FDA and has been authorized for detection and/or diagnosis of SARS-CoV-2 by FDA under an Emergency Use Authorization (EUA).  This EUA will remain in effect (meaning this test can be used) for the duration of the COVID-19 declaration under Section 564(b)(1) of the Act, 21 U.S.C. section 360bbb-3(b)(1), unless the authorization is terminated or revoked sooner. Performed at [facility], [place] [address], [place], [place] [address]   Blood Culture (routine x 2)     Status: None (Preliminary result)   Collection Time: 07/10/18 10:56 AM  Result Value Ref Range Status   Specimen Description BLOOD RIGHT FOREARM  Final   Special Requests   Final    BOTTLES DRAWN AEROBIC AND ANAEROBIC Blood Culture adequate volume   Culture   Final    NO GROWTH 4 DAYS Performed at [facility], [place] [address] [place], [place] [address]    Report Status PENDING  Incomplete      Radiology Studies: No results found.  Scheduled Meds: . aspirin EC  81 mg Oral Daily  . atorvastatin  80 mg Oral QHS  . enoxaparin (LOVENOX) injection  30 mg Subcutaneous Q24H  . insulin aspart  0-9 Units Subcutaneous TID WC  . insulin glargine  5 Units Subcutaneous QHS  . metoprolol succinate  25 mg Oral Daily  . PARoxetine  30 mg Oral Daily  . senna-docusate  1 tablet Oral QHS  . sodium chloride flush  3 mL Intravenous Q12H   . vitamin C  250 mg Oral BID  . zinc sulfate  220 mg Oral Daily   Continuous Infusions:    LOS: 4 days   Time spent: 25 minutes.  Patrecia Pour, MD [facility] [email or web address] Password [facility] 07/14/2018, 5:38 PM

## 2018-07-14 NOTE — NC FL2 (Signed)
[place] LEVEL OF CARE SCREENING TOOL     IDENTIFICATION  Patient Name: [name] Birthdate: 1946-01-04 Sex: female Admission Date (Current Location): 07/10/2018  [facility] and [place] Number:  Herbalist and Address:  [facility] [place], [place] [address], [place], [place])      Provider Number: [id number]  Attending Physician Name and Address:  Patrecia Pour, MD  Relative Name and Phone Number:  Radene Gunning (daughter) [phone]    Current Level of Care: Hospital Recommended Level of Care: [place] Prior Approval Number:    Date Approved/Denied:   PASRR Number: [id number]  Discharge Plan: SNF    Current Diagnoses: Patient Active Problem List   Diagnosis Date Noted  . COVID-19 07/10/2018  . AKI (acute kidney injury) ([place]) 07/22/2017  . Hypothermia 07/22/2017  . Hypoglycemia 07/21/2017  . Chronic diastolic CHF (congestive heart failure) ([place]) 06/22/2017  . Peripheral arterial disease ([place]) 06/18/2017  . B12 deficiency 05/01/2017  . Bilateral lower extremity edema 11/08/2015  . Aortic atherosclerosis ([place]) 06/07/2014  . Abnormality of gait 05/29/2012  . HOCM (hypertrophic obstructive cardiomyopathy) ([place]) 06/11/2011  . Acute on chronic renal failure ([place]) 05/25/2011  . Routine health maintenance 06/10/2010  . Hyperlipidemia   . Refusal of blood transfusions as patient is Jehovah's Witness   . History of adenocarcinoma of breast   . Osteoporosis   . Status post CVA   . Aortic stenosis s/p Tissure AVR 2006   . CAD (coronary artery disease)   . Hypertension   . Depression 01/23/2006  . Diabetes mellitus with renal manifestations, controlled ([place]) 03/25/1990    Orientation RESPIRATION BLADDER Height & Weight     Self, Time, Situation, Place  Normal Incontinent, External catheter Weight: 154 lb 15.7 oz (70.3 kg) Height:  $Remove'5\' 6"'wFmQlVj$  (167.6 cm)  BEHAVIORAL SYMPTOMS/MOOD NEUROLOGICAL  BOWEL NUTRITION STATUS      Incontinent Diet(see discharge summary)  AMBULATORY STATUS COMMUNICATION OF NEEDS Skin   Extensive Assist Verbally Skin abrasions(abrasion abdomen)                       Personal Care Assistance Level of Assistance  Bathing, Feeding, Dressing, Total care Bathing Assistance: Maximum assistance Feeding assistance: Maximum assistance Dressing Assistance: Maximum assistance Total Care Assistance: Maximum assistance   Functional Limitations Info  Hearing, Speech, Sight Sight Info: Adequate Hearing Info: Adequate Speech Info: Adequate    SPECIAL CARE FACTORS FREQUENCY  PT (By licensed PT), OT (By licensed OT)     PT Frequency: min 5x weekly OT Frequency: min 5x weekly            Contractures Contractures Info: Not present    Additional Factors Info  Code Status, Allergies, Isolation Precautions Code Status Info: full Allergies Info: no "blood products" as she is Jehovah's Witness     Isolation Precautions Info: Emerging Pathogen, air and contact precautions     Current Medications (07/14/2018):  This is the current hospital active medication list Current Facility-Administered Medications  Medication Dose Route Frequency Provider Last Rate Last Dose  . acetaminophen (TYLENOL) tablet 650 mg  650 mg Oral Q6H PRN Axel Filler, MD   650 mg at 07/12/18 1147  . aspirin EC tablet 81 mg  81 mg Oral Daily Axel Filler, MD   81 mg at 07/14/18 1037  . atorvastatin (LIPITOR) tablet 80 mg  80 mg Oral QHS Axel Filler, MD   80  mg at 07/13/18 2130  . enoxaparin (LOVENOX) injection 30 mg  30 mg Subcutaneous Q24H Axel Filler, MD   30 mg at 07/13/18 1400  . insulin aspart (novoLOG) injection 0-9 Units  0-9 Units Subcutaneous TID WC Axel Filler, MD   Stopped at 07/13/18 [id number]  . insulin glargine (LANTUS) injection 5 Units  5 Units Subcutaneous QHS Patrecia Pour, MD   5 Units at 07/13/18 2130  . metoprolol  succinate (TOPROL-XL) 24 hr tablet 25 mg  25 mg Oral Daily Axel Filler, MD   25 mg at 07/14/18 1039  . PARoxetine (PAXIL) tablet 30 mg  30 mg Oral Daily Axel Filler, MD   30 mg at 07/14/18 1039  . senna-docusate (Senokot-S) tablet 1 tablet  1 tablet Oral QHS Axel Filler, MD   1 tablet at 07/13/18 2130  . sodium chloride flush (NS) 0.9 % injection 3 mL  3 mL Intravenous Q12H Axel Filler, MD   3 mL at 07/14/18 1041  . vitamin C (ASCORBIC ACID) tablet 250 mg  250 mg Oral BID Allie Bossier, MD   250 mg at 07/14/18 1037  . zinc sulfate capsule 220 mg  220 mg Oral Daily Allie Bossier, MD   220 mg at 07/14/18 1039     Discharge Medications: Please see discharge summary for a list of discharge medications.  Relevant Imaging Results:  Relevant Lab Results:   Additional Information SSN: [id number]  Alberteen Sam, LCSW

## 2018-07-14 NOTE — Progress Notes (Signed)
CSW consulted with patient's daughter [name] regarding PT recommendation fo SNF before patient returns to [facility]. Daughter is in agreement with this recommendation and was informed of [facility] as being one of the only facilities accepting COVID snf patients at this time. [name] agrees for referral to be sent to [facility] for possible discharge plan, pending bed acceptance.   CSW will continue to follow for discharge planning.   [place], [place]

## 2018-07-14 NOTE — Progress Notes (Signed)
Occupational Therapy Treatment Patient Details Name: [name] MRN: [id number] DOB: April 05, 1945 Today's Date: 07/14/2018    History of present illness [name] is a 73 y.o. female with a history of breast CA s/p mastectomy, chemotherapy, HOCM, chornic HFpEF, mod AS s/p bioprosthetic AVR, T2DM, stage III CKD, and hx CVA who presented from ALF with 6 days of fever, myalgias and fatigue despite taking levaquin as prescribed by MD as outpatient for suspected pneumonia. She was seen in the ED and admitted for covid-19 infection, subsequently transferred to [facility] on 5/17.    OT comments  Pt less lethargic today and less rigid, but continues to have difficulty initiating tasks. Participated in bed level ADL with mod A for bathing. Used Maximove to mobilize to chair. Pt has difficulty with self feeding due to weakness and IV placement. Asked nurse to assist with feeding to improve PO intake. Will continue to follow acutely.   Follow Up Recommendations  SNF;Supervision/Assistance - 24 hour    Equipment Recommendations  None recommended by OT    Recommendations for Other Services      Precautions / Restrictions Precautions Precautions: Fall Precaution Comments: limited B shoulder ROM, legs are held stiffly in extension       Mobility Bed Mobility Overal bed mobility: Needs Assistance Bed Mobility: Rolling Rolling: Max assist;+2 for physical assistance            Transfers                      Balance     Sitting balance-Leahy Scale: Fair Sitting balance - Comments: (in recliner)                                   ADL either performed or assessed with clinical judgement   ADL Overall ADL's : Needs assistance/impaired Eating/Feeding: Moderate assistance Eating/Feeding Details (indicate cue type and reason): Able to drink using R hand. difficulty reaching mouth due to waekness and IV site; nsg asked to help feed pt Grooming: Moderate  assistance   Upper Body Bathing: Minimal assistance;Bed level   Lower Body Bathing: Maximal assistance;Bed level   Upper Body Dressing : Maximal assistance;Bed level                   Functional mobility during ADLs: +2 for physical assistance(Maximove used) General ADL Comments: increased ability to participate in ADL today     Vision       Perception     Praxis      Cognition Arousal/Alertness: Awake/alert Behavior During Therapy: Flat affect Overall Cognitive Status: Impaired/Different from baseline Area of Impairment: Attention;Orientation;Safety/judgement;Awareness;Problem solving;Memory                 Orientation Level: Disoriented to;Time;Place Current Attention Level: Sustained Memory: Decreased short-term memory Following Commands: Follows one step commands with increased time Safety/Judgement: Decreased awareness of safety;Decreased awareness of deficits Awareness: Emergent            Exercises Exercises: Other exercises   Shoulder Instructions       General Comments      Pertinent Vitals/ Pain       Pain Assessment: No/denies pain  Home Living  Prior Functioning/Environment              Frequency  Min 2X/week        Progress Toward Goals  OT Goals(current goals can now be found in the care plan section)  Progress towards OT goals: Progressing toward goals  Acute Rehab OT Goals Patient Stated Goal: to get stronger OT Goal Formulation: With patient/family Time For Goal Achievement: 07/27/18 Potential to Achieve Goals: Good ADL Goals Pt Will Perform Eating: with set-up;with supervision;with adaptive utensils;sitting Pt Will Perform Grooming: with min assist;sitting Pt Will Transfer to Toilet: with mod assist;bedside commode;stand pivot transfer Additional ADL Goal #1: Sit EOB with minguard A in preparation for ADL tasks  Plan Discharge plan remains  appropriate    Co-evaluation                 AM-PAC OT "6 Clicks" Daily Activity     Outcome Measure   Help from another person eating meals?: A Lot Help from another person taking care of personal grooming?: A Lot Help from another person toileting, which includes using toliet, bedpan, or urinal?: Total Help from another person bathing (including washing, rinsing, drying)?: A Lot Help from another person to put on and taking off regular upper body clothing?: A Lot Help from another person to put on and taking off regular lower body clothing?: Total 6 Click Score: 10    End of Session    OT Visit Diagnosis: Other abnormalities of gait and mobility (R26.89);Muscle weakness (generalized) (M62.81);Other symptoms and signs involving cognitive function;Pain Pain - part of body: Shoulder;Knee   Activity Tolerance Patient tolerated treatment well   Patient Left in chair;with call bell/phone within reach;with chair alarm set   Nurse Communication Mobility status;Need for lift equipment        Time: 0601-5615 OT Time Calculation (min): 35 min  Charges: OT General Charges $OT Visit: 1 Visit OT Treatments $Self Care/Home Management : 23-37 mins  Maurie Boettcher, OT/L   Acute OT Clinical Specialist [place] Pager [phone] Office [phone]    [facility] 07/14/2018, 1:39 PM

## 2018-07-15 LAB — COMPREHENSIVE METABOLIC PANEL
ALT: 27 U/L (ref 0–44)
AST: 41 U/L (ref 15–41)
Albumin: 2.3 g/dL — ABNORMAL LOW (ref 3.5–5.0)
Alkaline Phosphatase: 94 U/L (ref 38–126)
Anion gap: 10 (ref 5–15)
BUN: 59 mg/dL — ABNORMAL HIGH (ref 8–23)
CO2: 22 mmol/L (ref 22–32)
Calcium: 8.3 mg/dL — ABNORMAL LOW (ref 8.9–10.3)
Chloride: 107 mmol/L (ref 98–111)
Creatinine, Ser: 2.19 mg/dL — ABNORMAL HIGH (ref 0.44–1.00)
GFR calc Af Amer: 25 mL/min — ABNORMAL LOW (ref >60)
GFR calc non Af Amer: 21 mL/min — ABNORMAL LOW (ref >60)
Glucose, Bld: 93 mg/dL (ref 70–99)
Potassium: 4.6 mmol/L (ref 3.5–5.1)
Sodium: 139 mmol/L (ref 135–145)
Total Bilirubin: 0.5 mg/dL (ref 0.3–1.2)
Total Protein: 6.3 g/dL — ABNORMAL LOW (ref 6.5–8.1)

## 2018-07-15 LAB — CULTURE, BLOOD (ROUTINE X 2)
Culture: NO GROWTH
Culture: NO GROWTH
Special Requests: ADEQUATE

## 2018-07-15 LAB — MAGNESIUM: Magnesium: 2.5 mg/dL — ABNORMAL HIGH (ref 1.7–2.4)

## 2018-07-15 LAB — C-REACTIVE PROTEIN: CRP: 6.2 mg/dL — ABNORMAL HIGH (ref <1.0)

## 2018-07-15 LAB — GLUCOSE, CAPILLARY
Glucose-Capillary: 87 mg/dL (ref 70–99)
Glucose-Capillary: 125 mg/dL — ABNORMAL HIGH (ref 70–99)
Glucose-Capillary: 142 mg/dL — ABNORMAL HIGH (ref 70–99)
Glucose-Capillary: 156 mg/dL — ABNORMAL HIGH (ref 70–99)

## 2018-07-15 LAB — CK: Total CK: 74 U/L (ref 38–234)

## 2018-07-15 LAB — FERRITIN: Ferritin: 386 ng/mL — ABNORMAL HIGH (ref 11–307)

## 2018-07-15 LAB — D-DIMER, QUANTITATIVE: D-Dimer, Quant: 3.92 ug/mL-FEU — ABNORMAL HIGH (ref 0.00–0.50)

## 2018-07-15 LAB — PHOSPHORUS: Phosphorus: 4.4 mg/dL (ref 2.5–4.6)

## 2018-07-15 MED ORDER — AMLODIPINE BESYLATE 5 MG PO TABS
10.0000 mg | ORAL_TABLET | Freq: Every day | ORAL | Status: DC
  Administered 2018-07-15 – 2018-07-19 (×5): 10 mg via ORAL
  Filled 2018-07-15 (×6): qty 2

## 2018-07-15 NOTE — Progress Notes (Signed)
The pt's Daughter was called and updated regarding the pt's current status.

## 2018-07-15 NOTE — Progress Notes (Signed)
Physical Therapy Treatment Patient Details Name: ALIANI CACCAVALE MRN: [id number] DOB: [birth year] Today's Date: 07/15/2018    History of Present Illness ARALYNN BRAKE is a 73 y.o. female with a history of breast CA s/p mastectomy, chemotherapy, HOCM, chornic HFpEF, mod AS s/p bioprosthetic AVR, T2DM, stage III CKD, and hx CVA who presented from ALF with 6 days of fever, myalgias and fatigue despite taking levaquin as prescribed by MD as outpatient for suspected pneumonia. She was seen in the ED and admitted for covid-19 infection, subsequently transferred to [facility] on 5/17.     PT Comments    The patient  Is more alert. All extremities are rigid. Able to more easily flex each knee in sitting with more flexion at the knees. Patient continues to be significantly debilitated. Continue to progress PT.   Follow Up Recommendations  SNF     Equipment Recommendations  None recommended by PT    Recommendations for Other Services       Precautions / Restrictions Precautions Precautions: Fall Precaution Comments: limited B shoulder ROM, legs are held stiffly in extension, L >R , paient was able to "relax" a little more.    Mobility  Bed Mobility   Bed Mobility: Rolling;Sidelying to Sit Rolling: +2 for physical assistance;Max assist         General bed mobility comments: multimodal cues for  rolling, did assist to reach for rail to L and R,, required assist for legs to facilitate a rolling. /Total assist for legs and for trunk to sit upright. . Cues and assist for knees to bend and feet to floor. legs [place] very stiff.  Transfers Overall transfer level: Needs assistance Equipment used: Ambulation equipment used Transfers: Sit to/from Stand           General transfer comment: 2 person assist/total to stand  from bed raised at [facility] , pt. reached for stedy bar.Bed pad to pull  pelvis in for flaps to drop.     2 assist total to stand from stedy, patient does not  assist.  Ambulation/Gait                 Stairs             Wheelchair Mobility    Modified Rankin (Stroke Patients Only)       Balance Overall balance assessment: Needs assistance   Sitting balance-Leahy Scale: Poor Sitting balance - Comments: patient did sit at midline once sitting on the bed edge after facilitating forward flexion of trunk. patient initiated sitting forward in recliner.   Standing balance support: Bilateral upper extremity supported Standing balance-Leahy Scale: Poor Standing balance comment: feet had to be placed back on stedy to prepare to stand.                            Cognition Arousal/Alertness: Awake/alert Behavior During Therapy: Flat affect Overall Cognitive Status: Impaired/Different from baseline Area of Impairment: Attention;Orientation;Safety/judgement;Awareness;Problem solving;Memory                         Safety/Judgement: Decreased awareness of safety;Decreased awareness of deficits Awareness: Intellectual Problem Solving: Slow processing;Decreased initiation;Difficulty sequencing;Requires verbal cues;Requires tactile cues        Exercises      General Comments        Pertinent Vitals/Pain Faces Pain Scale: No hurt    Home Living  Prior Function            PT Goals (current goals can now be found in the care plan section) Progress towards PT goals: Progressing toward goals    Frequency    Min 2X/week      PT Plan Current plan remains appropriate    Co-evaluation PT/OT/SLP Co-Evaluation/Treatment: Yes Reason for Co-Treatment: Complexity of the patient's impairments (multi-system involvement);For patient/therapist safety PT goals addressed during session: Mobility/safety with mobility OT goals addressed during session: ADL's and self-care      AM-PAC PT "6 Clicks" Mobility   Outcome Measure  Help needed turning from your back to your side  while in a flat bed without using bedrails?: Total Help needed moving from lying on your back to sitting on the side of a flat bed without using bedrails?: Total Help needed moving to and from a bed to a chair (including a wheelchair)?: Total Help needed standing up from a chair using your arms (e.g., wheelchair or bedside chair)?: Total Help needed to walk in hospital room?: Total Help needed climbing 3-5 steps with a railing? : Total 6 Click Score: 6    End of Session Equipment Utilized During Treatment: Gait belt Activity Tolerance: Patient tolerated treatment well Patient left: in chair;with call bell/phone within reach Nurse Communication: Mobility status PT Visit Diagnosis: Unsteadiness on feet (R26.81);Muscle weakness (generalized) (M62.81)     Time: 6294-7654 PT Time Calculation (min) (ACUTE ONLY): 28 min  Charges:  $Therapeutic Activity: 8-22 mins                     Tresa Endo PT Acute Rehabilitation Services Pager [phone] Office [phone]    Claretha Cooper 07/15/2018, 4:43 PM

## 2018-07-15 NOTE — Progress Notes (Signed)
A voicemail was left for the Social Worker regarding the pt being ready for SNF placement per Bonner Puna, MD.

## 2018-07-15 NOTE — Progress Notes (Signed)
Bonner Puna, MD was notified regarding the pt's BP of 181/78.

## 2018-07-15 NOTE — Progress Notes (Signed)
Paged on call MD because patient had not voided since the beginning of shift and bladder scan revealed >450.  Was given order to In and Out cath patient.

## 2018-07-15 NOTE — Progress Notes (Signed)
CardioVascular Research Department and AHF Team  ReDS Research Project   Patient #: [id number]  ReDS Measurement  Right: 38 %  Left: 31 %

## 2018-07-15 NOTE — Progress Notes (Signed)
PROGRESS NOTE  [name]  FBP:[id number] DOB: January 04, 1946 DOA: 07/10/2018 PCP: No primary care provider on file.   Brief Narrative: [name] is a 73 y.o. female with a history of breast CA s/p mastectomy, chemotherapy, HOCM, chornic HFpEF, mod AS s/p bioprosthetic AVR, T2DM, stage III CKD, and hx CVA who presented from ALF with 6 days of fever, myalgias and fatigue despite taking levaquin as prescribed by MD as outpatient for suspected pneumonia. She was seen in the ED and admitted for covid-19 infection, subsequently transferred to [facility] on 5/17.   Assessment & Plan: Principal Problem:   COVID-19 Active Problems:   Hyperlipidemia   Refusal of blood transfusions as patient is Jehovah's Witness   History of adenocarcinoma of breast   Aortic stenosis s/p Tissure AVR 2006   CAD (coronary artery disease)   Hypertension   Acute on chronic renal failure (HCC)   HOCM (hypertrophic obstructive cardiomyopathy) (HCC)   Peripheral arterial disease (HCC)   Chronic diastolic CHF (congestive heart failure) ([place])  Covid-19 viral pneumonia:  - Continue airborne, contact precautions. PPE including surgical gown, gloves, face shield, cap, shoe covers, and N-95 used during this encounter in a negative pressure room.  - Check daily labs: CBC w/diff, CMP, d-dimer, fibrinogen, ferritin, LDH, CRP. Inflammatory markers declining and remains without hypoxia. Will not initiate any therapy at this time. - Enoxaparin prophylactic dose, 30mg  as CrCl < 18ml/min. - Blood cultures drawn on admission. PCT low, No infiltrate on CXR. - Maintain euvolemia/net negative.  - Avoid NSAIDs  Weakness: Nonfocal, negative CT head. Suspect due to covid infection.  - PT/OT, mobilize as much as possible. Will need rehab.   T2DM: HbA1c 6.9% indicating good control.  - Decreased longacting insulin with low blood sugar this AM. Continue SSI, holding metformin.   AKI on stage III CKD: Cr better today.  - Continue  holding diuretic and gave IVF. BNP 902, though this has been chronically elevated.  - Monitor UOP, I/O  Chronic HFpEF, HOCM, CAD s/p CABG, AS s/p AVR 2006:  - Continue beta blocker, ASA, statin - Monitor I/O, weights  HTN:  - Restart norvasc, cont metoprolol.  Prolonged QTc: K and Mg wnl. - Magnesium supplementation as below.  - Continue telemetry.  - Avoid azithromycin and other provocative agents.   Hypomagnesemia:  - Supplemented   Anemia of chronic disease: Hgb stable 10-11.  - Not patient is Jehovah's Witness and would not consent to blood products were they to be indicated.  DVT prophylaxis: Lovenox Code Status: Full confirmed earlier in hospitalization Family Communication: None at bedside. Updated daughter by phone today. Disposition Plan: If labs continue improving and patient's subjective improvement sustains, will be stable for discharge 5/22.  Consultants:   None  Procedures:   None  Antimicrobials:  None   Subjective: Feels better today, less weak, appetite returning. No dyspnea or fevers   Objective: Vitals:   07/15/18 1000 07/15/18 1200 07/15/18 1258 07/15/18 1302  BP:  (!) 167/77  (!) 181/78  Pulse: 71 67 71 69  Resp: 16 15 16 15   Temp:   97.6 F (36.4 C)   TempSrc:   Axillary   SpO2: 98% 97% 96% 97%  Weight:      Height:        Intake/Output Summary (Last 24 hours) at 07/15/2018 1304 Last data filed at 07/15/2018 1254 Gross per 24 hour  Intake 180 ml  Output 850 ml  Net -670 ml   [employer]  07/10/18 1000 07/14/18 0500 07/15/18 0500  Weight: 76.2 kg 70.3 kg 69.8 kg   Gen: 73 y.o. female in no distress Pulm: Nonlabored breathing room air. Clear. CV: Regular rate and rhythm. II/VI holosystolic murmur, rub, or gallop. No JVD, trace dependent edema. GI: Abdomen soft, non-tender, non-distended, with normoactive bowel sounds.  Ext: Warm, no deformities Skin: No rashes, lesions or ulcers on visualized skin. Neuro: Alert and oriented.  Diffusely weak without focal neurological deficits. Psych: Judgement and insight appear fair. Mood euthymic & affect congruent. Behavior is appropriate.    Data Reviewed: I have personally reviewed following labs and imaging studies  CBC: Recent Labs  Lab 07/10/18 1025 07/11/18 0631 07/12/18 0316 07/13/18 0334 07/14/18 0342  WBC 7.7 7.8 9.7 11.1* 8.0  NEUTROABS 6.1 6.2 8.0* 9.3* 5.8  HGB 10.8* 10.3* 10.2* 10.2* 10.5*  HCT 33.6* 31.2* 31.0* 31.6* 32.7*  MCV 92.3 91.2 90.9 90.8 91.6  PLT 194 207 233 278 081   Basic Metabolic Panel: Recent Labs  Lab 07/11/18 0631 07/11/18 2304 07/12/18 0316 07/13/18 0334 07/14/18 0342 07/15/18 0630  NA 137 137  --  139 137 139  K 4.0 3.9  --  4.1 4.0 4.6  CL 103 105  --  107 105 107  CO2 20* 21*  --  $R'22 22 22  'FM$ GLUCOSE 118* 106*  --  62* 98 93  BUN 40* 45*  --  54* 60* 59*  CREATININE 2.91* 2.64*  --  2.52* 2.47* 2.19*  CALCIUM 8.2* 7.9*  --  8.1* 8.2* 8.3*  MG 1.6*  --  1.6*  --  2.3 2.5*  PHOS  --   --  4.3 4.4 4.5 4.4   GFR: Estimated Creatinine Clearance: 21.1 mL/min (A) (by C-G formula based on SCr of 2.19 mg/dL (H)). Liver Function Tests: Recent Labs  Lab 07/11/18 0631 07/11/18 2304 07/13/18 0334 07/14/18 0342 07/15/18 0630  AST 23 25 36 47* 41  ALT $Re'12 14 19 29 27  'Svu$ ALKPHOS 71 68 77 82 94  BILITOT 0.6 0.2* 0.4 0.4 0.5  PROT 5.8* 5.8* 5.9* 6.1* 6.3*  ALBUMIN 2.3* 2.3* 2.2* 2.3* 2.3*   No results for input(s): LIPASE, AMYLASE in the last 168 hours. No results for input(s): AMMONIA in the last 168 hours. Coagulation Profile: No results for input(s): INR, PROTIME in the last 168 hours. Cardiac Enzymes: Recent Labs  Lab 07/10/18 1025 07/11/18 2304 07/12/18 0316 07/13/18 0334 07/14/18 0342 07/15/18 0630  CKTOTAL  --   --  170 116 77 74  TROPONINI 0.06* 0.07*  --   --   --   --    BNP (last 3 results) No results for input(s): PROBNP in the last 8760 hours. HbA1C: No results for input(s): HGBA1C in the last 72  hours. CBG: Recent Labs  Lab 07/14/18 1136 07/14/18 1635 07/14/18 2118 07/15/18 0756 07/15/18 1157  GLUCAP 86 120* 109* 87 125*   Lipid Profile: No results for input(s): CHOL, HDL, LDLCALC, TRIG, CHOLHDL, LDLDIRECT in the last 72 hours. Thyroid Function Tests: No results for input(s): TSH, T4TOTAL, FREET4, T3FREE, THYROIDAB in the last 72 hours. Anemia Panel: Recent Labs    07/14/18 0342 07/15/18 0630  FERRITIN 432* 386*   Urine analysis:    Component Value Date/Time   COLORURINE AMBER (A) 07/11/2018 1826   APPEARANCEUR HAZY (A) 07/11/2018 1826   LABSPEC 1.020 07/11/2018 1826   PHURINE 5.0 07/11/2018 1826   GLUCOSEU NEGATIVE 07/11/2018 1826   GLUCOSEU NEG mg/dL 12/25/2006 2041  HGBUR NEGATIVE 07/11/2018 1826   HGBUR trace-lysed 03/05/2010 0856   BILIRUBINUR NEGATIVE 07/11/2018 1826   BILIRUBINUR negative 11/11/2011 1701   KETONESUR NEGATIVE 07/11/2018 1826   PROTEINUR >=300 (A) 07/11/2018 1826   UROBILINOGEN 0.2 11/11/2011 1701   UROBILINOGEN 0.2 11/11/2011 1645   NITRITE NEGATIVE 07/11/2018 1826   LEUKOCYTESUR SMALL (A) 07/11/2018 1826   Recent Results (from the past 240 hour(s))  Blood Culture (routine x 2)     Status: None   Collection Time: 07/10/18 10:25 AM  Result Value Ref Range Status   Specimen Description BLOOD RIGHT HAND  Final   Special Requests   Final    BOTTLES DRAWN AEROBIC AND ANAEROBIC Blood Culture results may not be optimal due to an inadequate volume of blood received in culture bottles   Culture   Final    NO GROWTH 5 DAYS Performed at [facility], [place] [address], [place], [place] [address]    Report Status 07/15/2018 FINAL  Final  SARS Coronavirus 2 (CEPHEID- Performed in [facility] lab), Hosp Order     Status: Abnormal   Collection Time: 07/10/18 10:30 AM  Result Value Ref Range Status   SARS Coronavirus 2 POSITIVE (A) NEGATIVE Final    Comment: CRITICAL RESULT CALLED TO, READ BACK BY AND VERIFIED WITH: RN CONNIE V.  [id number] (NOTE) If result is NEGATIVE SARS-CoV-2 target nucleic acids are NOT DETECTED. The SARS-CoV-2 RNA is generally detectable in upper and lower  respiratory specimens during the acute phase of infection. The lowest  concentration of SARS-CoV-2 viral copies this assay can detect is 250  copies / mL. A negative result does not preclude SARS-CoV-2 infection  and should not be used as the sole basis for treatment or other  patient management decisions.  A negative result may occur with  improper specimen collection / handling, submission of specimen other  than nasopharyngeal swab, presence of viral mutation(s) within the  areas targeted by this assay, and inadequate number of viral copies  (<250 copies / mL). A negative result must be combined with clinical  observations, patient history, and epidemiological information. If result is POSITIVE SARS-CoV-2 target nucleic acids are DETECTED.  The SARS-CoV-2 RNA is generally detectable in upper and lower  respiratory specimens during the acute phase of infection.  Positive  results are indicative of active infection with SARS-CoV-2.  Clinical  correlation with patient history and other diagnostic information is  necessary to determine patient infection status.  Positive results do  not rule out bacterial infection or co-infection with other viruses. If result is PRESUMPTIVE POSTIVE SARS-CoV-2 nucleic acids MAY BE PRESENT.   A presumptive positive result was obtained on the submitted specimen  and confirmed on repeat testing.  While 2019 novel coronavirus  (SARS-CoV-2) nucleic acids may be present in the submitted sample  additional confirmatory testing may be necessary for epidemiological  and / or clinical management purposes  to differentiate between  SARS-CoV-2 and other Sarbecovirus currently known to infect humans.  If clinically indicated additional testing with an alternate test  methodology [phone]) i s advised. The  SARS-CoV-2 RNA is generally  detectable in upper and lower respiratory specimens during the acute  phase of infection. The expected result is Negative. Fact Sheet for Patients:  StrictlyIdeas.no Fact Sheet for Healthcare Providers: BankingDealers.co.za This test is not yet approved or cleared by the [place] FDA and has been authorized for detection and/or diagnosis of SARS-CoV-2 by FDA under an Emergency Use  Authorization (EUA).  This EUA will remain in effect (meaning this test can be used) for the duration of the COVID-19 declaration under Section 564(b)(1) of the Act, 21 U.S.C. section 360bbb-3(b)(1), unless the authorization is terminated or revoked sooner. Performed at [facility], [place] [address], [place], [place] [address]   Blood Culture (routine x 2)     Status: None   Collection Time: 07/10/18 10:56 AM  Result Value Ref Range Status   Specimen Description BLOOD RIGHT FOREARM  Final   Special Requests   Final    BOTTLES DRAWN AEROBIC AND ANAEROBIC Blood Culture adequate volume   Culture   Final    NO GROWTH 5 DAYS Performed at [facility], [place] [address], [place], [place] [address]    Report Status 07/15/2018 FINAL  Final      Radiology Studies: No results found.  Scheduled Meds:  aspirin EC  81 mg Oral Daily   atorvastatin  80 mg Oral QHS   enoxaparin (LOVENOX) injection  30 mg Subcutaneous Q24H   insulin aspart  0-9 Units Subcutaneous TID WC   insulin glargine  5 Units Subcutaneous QHS   metoprolol succinate  25 mg Oral Daily   PARoxetine  30 mg Oral Daily   senna-docusate  1 tablet Oral QHS   sodium chloride flush  3 mL Intravenous Q12H   vitamin C  250 mg Oral BID   zinc sulfate  220 mg Oral Daily   Continuous Infusions:    LOS: 5 days   Time spent: 25 minutes.  Patrecia Pour, MD [facility] [email or web address] Password [email or web address] 07/15/2018, 1:04 PM

## 2018-07-15 NOTE — Progress Notes (Signed)
OT Progress Note  Pt mobilized to chair with +2 Max A using Stedy. Pt more alert and engaging, especially once in chair. Music used to help engage pt in exercise successfully.  Pt able to self feed after setup but requires cues to increase PO intake. Able to brush teeth after set up. Improved ability to transition weight anteriorly and not push posteriorly today. Daughter called at end of session. Pt stated she would "tell her how she did". Continue to recommend rehab at [facility].     07/15/18 1800  OT Visit Information  Last OT Received On 07/15/18  Assistance Needed +2  History of Present Illness [name] is a 73 y.o. female with a history of breast CA s/p mastectomy, chemotherapy, HOCM, chornic HFpEF, mod AS s/p bioprosthetic AVR, T2DM, stage III CKD, and hx CVA who presented from ALF with 6 days of fever, myalgias and fatigue despite taking levaquin as prescribed by MD as outpatient for suspected pneumonia. She was seen in the ED and admitted for covid-19 infection, subsequently transferred to [facility] on 5/17.   Precautions  Precautions Fall  Precaution Comments limited B shoulder ROM, legs are held stiffly in extension, L >R , paient was able to "relax" a little more.  Pain Assessment  Pain Assessment Faces  Faces Pain Scale 4  Pain Location general discomfort  Pain Descriptors / Indicators Discomfort;Guarding  Pain Intervention(s) Limited activity within patient's tolerance  Cognition  Arousal/Alertness Awake/alert  Behavior During Therapy Flat affect (though more engaging)  Overall Cognitive Status Impaired/Different from baseline  Area of Impairment Attention;Orientation;Safety/judgement;Awareness;Problem solving;Memory  Orientation Level Time  Current Attention Level Selective  Memory Decreased short-term memory  Following Commands Follows one step commands with increased time  Safety/Judgement Decreased awareness of safety;Decreased awareness of deficits  Awareness Emergent   Problem Solving Slow processing;Difficulty sequencing;Decreased initiation  Upper Extremity Assessment  Upper Extremity Assessment Generalized weakness  RUE Deficits / Details able to complete hand to mouth movement BUE. unable to reach top of head  LUE Deficits / Details simialr to R although better shoulder ROM/strength  Lower Extremity Assessment  Lower Extremity Assessment Defer to PT evaluation  ADL  Eating/Feeding Set up;Supervision/ safety;Sitting  Grooming Minimal assistance  Grooming Details (indicate cue type and reason) able to brush teeth with set up; A for hair care  Toileting- Clothing Manipulation and Hygiene Maximal assistance (with rollling)  Bed Mobility  Overal bed mobility Needs Assistance  Bed Mobility Rolling;Sidelying to Sit  Rolling +2 for physical assistance;Max assist  Sidelying to sit Max assist;+2 for physical assistance  Balance  Sitting balance-Leahy Scale Poor  Sitting balance - Comments patient did sit at midline once sitting on the bed edge after facilitating forward flexion of trunk. patient initiated sitting forward in recliner.  Standing balance support Bilateral upper extremity supported  Standing balance-Leahy Scale Poor  Standing balance comment feet had to be placed back on stedy to prepare to stand.  Transfers  Overall transfer level Needs assistance  Equipment used Ambulation equipment used  Transfer via [place] to/from Stand  Sit to Stand +2 physical assistance;Max assist  General transfer comment 2 person assist/total to stand  from bed raised at [facility] , pt. reached for stedy bar.Bed pad to pull  pelvis in for flaps to drop.     2 assist total to stand from stedy, patient does not assist.  Exercises  Exercises General Upper Extremity  General Exercises - Upper Extremity  Shoulder Flexion AAROM;AROM;Both;10  reps;Seated (within comfort range)  Elbow Flexion Strengthening;AROM;Both;15 reps  Elbow Extension  AROM;Strengthening;Both;15 reps;Seated  OT - End of Session  Equipment Utilized During Treatment Gait belt;Other (comment) Charlaine Dalton)  Activity Tolerance Patient tolerated treatment well  Patient left in chair;with call bell/phone within reach;with chair alarm set  Nurse Communication Mobility status;Need for lift equipment  OT Assessment/Plan  OT Plan Discharge plan remains appropriate  OT Visit Diagnosis Other abnormalities of gait and mobility (R26.89);Muscle weakness (generalized) (M62.81);Other symptoms and signs involving cognitive function;Pain  Pain - part of body Shoulder;Knee  OT Frequency (ACUTE ONLY) Min 2X/week  Follow Up Recommendations SNF;Supervision/Assistance - 24 hour  OT Equipment None recommended by OT  AM-PAC OT "6 Clicks" Daily Activity Outcome Measure (Version 2)  Help from another person eating meals? 3  Help from another person taking care of personal grooming? 3  Help from another person toileting, which includes using toliet, bedpan, or urinal? 1  Help from another person bathing (including washing, rinsing, drying)? 2  Help from another person to put on and taking off regular upper body clothing? 2  Help from another person to put on and taking off regular lower body clothing? 1  6 Click Score 12  OT Goal Progression  Progress towards OT goals Progressing toward goals  Acute Rehab OT Goals  Patient Stated Goal to brush her teeth  OT Goal Formulation With patient/family  Time For Goal Achievement 07/27/18  Potential to Achieve Goals Good  ADL Goals  Pt Will Perform Eating with set-up;with supervision;with adaptive utensils;sitting  Pt Will Perform Grooming with min assist;sitting  Pt Will Transfer to Toilet with mod assist;bedside commode;stand pivot transfer  Additional ADL Goal #1 Sit EOB with minguard A in preparation for ADL tasks  OT Time Calculation  OT Start Time (ACUTE ONLY) 1544  OT Stop Time (ACUTE ONLY) 1630  OT Time Calculation (min) 46 min   Maurie Boettcher, OT/L   Acute OT Clinical Specialist [place] Pager [phone] Office [phone]

## 2018-07-16 LAB — CBC WITH DIFFERENTIAL/PLATELET
Abs Immature Granulocytes: 0.26 10*3/uL — ABNORMAL HIGH (ref 0.00–0.07)
Basophils Absolute: 0 10*3/uL (ref 0.0–0.1)
Basophils Relative: 0 %
Eosinophils Absolute: 0.2 10*3/uL (ref 0.0–0.5)
Eosinophils Relative: 1 %
HCT: 31.9 % — ABNORMAL LOW (ref 36.0–46.0)
Hemoglobin: 10.2 g/dL — ABNORMAL LOW (ref 12.0–15.0)
Immature Granulocytes: 2 %
Lymphocytes Relative: 4 %
Lymphs Abs: 0.5 10*3/uL — ABNORMAL LOW (ref 0.7–4.0)
MCH: 29 pg (ref 26.0–34.0)
MCHC: 32 g/dL (ref 30.0–36.0)
MCV: 90.6 fL (ref 80.0–100.0)
Monocytes Absolute: 0.8 10*3/uL (ref 0.1–1.0)
Monocytes Relative: 7 %
Neutro Abs: 10.6 10*3/uL — ABNORMAL HIGH (ref 1.7–7.7)
Neutrophils Relative %: 86 %
Platelets: 385 10*3/uL (ref 150–400)
RBC: 3.52 MIL/uL — ABNORMAL LOW (ref 3.87–5.11)
RDW: 13.3 % (ref 11.5–15.5)
WBC: 12.4 10*3/uL — ABNORMAL HIGH (ref 4.0–10.5)
nRBC: 0 % (ref 0.0–0.2)

## 2018-07-16 LAB — BASIC METABOLIC PANEL
Anion gap: 8 (ref 5–15)
BUN: 58 mg/dL — ABNORMAL HIGH (ref 8–23)
CO2: 24 mmol/L (ref 22–32)
Calcium: 8.2 mg/dL — ABNORMAL LOW (ref 8.9–10.3)
Chloride: 107 mmol/L (ref 98–111)
Creatinine, Ser: 1.97 mg/dL — ABNORMAL HIGH (ref 0.44–1.00)
GFR calc Af Amer: 28 mL/min — ABNORMAL LOW (ref >60)
GFR calc non Af Amer: 24 mL/min — ABNORMAL LOW (ref >60)
Glucose, Bld: 147 mg/dL — ABNORMAL HIGH (ref 70–99)
Potassium: 4 mmol/L (ref 3.5–5.1)
Sodium: 139 mmol/L (ref 135–145)

## 2018-07-16 LAB — GLUCOSE, CAPILLARY
Glucose-Capillary: 144 mg/dL — ABNORMAL HIGH (ref 70–99)
Glucose-Capillary: 129 mg/dL — ABNORMAL HIGH (ref 70–99)
Glucose-Capillary: 119 mg/dL — ABNORMAL HIGH (ref 70–99)
Glucose-Capillary: 125 mg/dL — ABNORMAL HIGH (ref 70–99)
Glucose-Capillary: 92 mg/dL (ref 70–99)
Glucose-Capillary: 93 mg/dL (ref 70–99)

## 2018-07-16 LAB — FERRITIN: Ferritin: 301 ng/mL (ref 11–307)

## 2018-07-16 LAB — C-REACTIVE PROTEIN: CRP: 4.3 mg/dL — ABNORMAL HIGH (ref <1.0)

## 2018-07-16 LAB — D-DIMER, QUANTITATIVE: D-Dimer, Quant: 1.47 ug/mL-FEU — ABNORMAL HIGH (ref 0.00–0.50)

## 2018-07-16 NOTE — Progress Notes (Addendum)
CSW notes patient medically stable for discharge per MD. [facility], CSW has reached out to see if they have a bed available for today. Pending response at this time.   Update: [facility] bed avail Monday 5/25 for patient to discharge to. MD and Daughter [name] updated.   [place], [place]

## 2018-07-16 NOTE — Progress Notes (Signed)
PROGRESS NOTE  SHADAVIA [name]  NOB:[id number] DOB: August 05, 1945 DOA: 07/10/2018 PCP: No primary care provider on file.   Brief Narrative: [name] is a 73 y.o. female with a history of breast CA s/p mastectomy, chemotherapy, HOCM, chornic HFpEF, mod AS s/p bioprosthetic AVR, T2DM, stage III CKD, and hx CVA who presented from ALF with 6 days of fever, myalgias and fatigue despite taking levaquin as prescribed by MD as outpatient for suspected pneumonia. She was seen in the ED and admitted for covid-19 infection, subsequently transferred to [facility] on 5/17.   Assessment & Plan: Principal Problem:   COVID-19 Active Problems:   Hyperlipidemia   Refusal of blood transfusions as patient is Jehovah's Witness   History of adenocarcinoma of breast   Aortic stenosis s/p Tissure AVR 2006   CAD (coronary artery disease)   Hypertension   Acute on chronic renal failure (HCC)   HOCM (hypertrophic obstructive cardiomyopathy) (HCC)   Peripheral arterial disease (HCC)   Chronic diastolic CHF (congestive heart failure) ([place])  Covid-19 viral pneumonia:  - Continue airborne, contact precautions. PPE including surgical gown, gloves, face shield, cap, shoe covers, and N-95 used during this encounter in a negative pressure room.  - Check daily labs: CBC w/diff, CMP, d-dimer, fibrinogen, ferritin, LDH, CRP. Inflammatory markers declining and remains without hypoxia. Will not initiate any therapy at this time. - Enoxaparin prophylactic dose, 30mg  as CrCl < 48ml/min. - Blood cultures drawn on admission. PCT low, No infiltrate on CXR. - Maintain euvolemia/net negative.  - Avoid NSAIDs  Weakness: Nonfocal, negative CT head. Suspect due to covid infection.  - PT/OT, mobilize as much as possible. Awaiting disposition to rehabilitation.  T2DM: HbA1c 6.9% indicating good control.  - Decreased longacting insulin. Continue lantus 5u + SSI. At inpatient goal.  - With CrCl < 35ml/min will not plan on continuing  metformin at discharge.   AKI on stage III CKD: Cr improved. - Continue holding diuretic and gave IVF. Can restart lasix at discharge. BNP 902, though this has been chronically elevated.  - Monitor UOP, I/O  Chronic HFpEF, HOCM, CAD s/p CABG, AS s/p AVR 2006:  - Continue beta blocker, ASA, statin - Monitor I/O, weights  HTN:  - Restart norvasc, cont metoprolol.  Prolonged QTc: K and Mg wnl. - Magnesium supplementation as below.  - Continue telemetry.  - Avoid azithromycin and other provocative agents.   Hypomagnesemia:  - Supplemented, recheck in AM.  Anemia of chronic disease: Hgb stable 10-11.  - Not patient is Jehovah's Witness and would not consent to blood products were they to be indicated.  DVT prophylaxis: Lovenox Code Status: Full confirmed earlier in hospitalization Family Communication: None at bedside. Will call with any clinical updates as necessary. Disposition Plan: Stable for discharge, SNF unable to accept until 5/25. They request repeat test, ordered for 5/23.   Consultants:   None  Procedures:   None  Antimicrobials:  None   Subjective: Weakness slightly improved, no focal weakness or numbness reported. Not very hungry. No dyspnea, cough, fever.  Objective: Vitals:   07/16/18 0920 07/16/18 1100 07/16/18 1200 07/16/18 1329  BP:   119/76   Pulse: 70 67 68 70  Resp: 15 14 14 12   Temp:    98.1 F (36.7 C)  TempSrc:    Oral  SpO2: 96% 98% 96% 96%  Weight:      Height:        Intake/Output Summary (Last 24 hours) at 07/16/2018 1338 Last data filed  at 07/16/2018 1300 Gross per 24 hour  Intake 60 ml  Output 400 ml  Net -340 ml   Filed Weights   07/14/18 0500 07/15/18 0500 07/16/18 0500  Weight: 70.3 kg 69.8 kg 70.1 kg   Gen: 73 y.o. female in no distress Pulm: Nonlabored breathing room air. Clear. CV: Regular rate and rhythm. II/VI holosystolic murmur at the base, no rub, or gallop. No JVD, no dependent edema. GI: Abdomen soft,  non-tender, non-distended, with normoactive bowel sounds.  Ext: Warm, no deformities Skin: No rashes, lesions or ulcers on visualized skin. Neuro: Alert and oriented. Diffusely weak without focal neurological deficits. Psych: Judgement and insight appear fair. Mood euthymic & affect congruent. Behavior is appropriate.     Data Reviewed: I have personally reviewed following labs and imaging studies  CBC: Recent Labs  Lab 07/11/18 0631 07/12/18 0316 07/13/18 0334 07/14/18 0342 07/16/18 0543  WBC 7.8 9.7 11.1* 8.0 12.4*  NEUTROABS 6.2 8.0* 9.3* 5.8 10.6*  HGB 10.3* 10.2* 10.2* 10.5* 10.2*  HCT 31.2* 31.0* 31.6* 32.7* 31.9*  MCV 91.2 90.9 90.8 91.6 90.6  PLT 207 233 278 312 947   Basic Metabolic Panel: Recent Labs  Lab 07/11/18 0631 07/11/18 2304 07/12/18 0316 07/13/18 0334 07/14/18 0342 07/15/18 0630 07/16/18 0543  NA 137 137  --  139 137 139 139  K 4.0 3.9  --  4.1 4.0 4.6 4.0  CL 103 105  --  107 105 107 107  CO2 20* 21*  --  $R'22 22 22 24  'JK$ GLUCOSE 118* 106*  --  62* 98 93 147*  BUN 40* 45*  --  54* 60* 59* 58*  CREATININE 2.91* 2.64*  --  2.52* 2.47* 2.19* 1.97*  CALCIUM 8.2* 7.9*  --  8.1* 8.2* 8.3* 8.2*  MG 1.6*  --  1.6*  --  2.3 2.5*  --   PHOS  --   --  4.3 4.4 4.5 4.4  --    GFR: Estimated Creatinine Clearance: 23.5 mL/min (A) (by C-G formula based on SCr of 1.97 mg/dL (H)). Liver Function Tests: Recent Labs  Lab 07/11/18 0631 07/11/18 2304 07/13/18 0334 07/14/18 0342 07/15/18 0630  AST 23 25 36 47* 41  ALT $Re'12 14 19 29 27  'LUC$ ALKPHOS 71 68 77 82 94  BILITOT 0.6 0.2* 0.4 0.4 0.5  PROT 5.8* 5.8* 5.9* 6.1* 6.3*  ALBUMIN 2.3* 2.3* 2.2* 2.3* 2.3*   No results for input(s): LIPASE, AMYLASE in the last 168 hours. No results for input(s): AMMONIA in the last 168 hours. Coagulation Profile: No results for input(s): INR, PROTIME in the last 168 hours. Cardiac Enzymes: Recent Labs  Lab 07/10/18 1025 07/11/18 2304 07/12/18 0316 07/13/18 0334 07/14/18 0342  07/15/18 0630  CKTOTAL  --   --  170 116 77 74  TROPONINI 0.06* 0.07*  --   --   --   --    BNP (last 3 results) No results for input(s): PROBNP in the last 8760 hours. HbA1C: No results for input(s): HGBA1C in the last 72 hours. CBG: Recent Labs  Lab 07/15/18 2211 07/16/18 0817 07/16/18 0922 07/16/18 1156 07/16/18 1248  GLUCAP 156* 144* 129* 119* 125*   Lipid Profile: No results for input(s): CHOL, HDL, LDLCALC, TRIG, CHOLHDL, LDLDIRECT in the last 72 hours. Thyroid Function Tests: No results for input(s): TSH, T4TOTAL, FREET4, T3FREE, THYROIDAB in the last 72 hours. Anemia Panel: Recent Labs    07/15/18 0630 07/16/18 0543  FERRITIN 386* 301   Urine  analysis:    Component Value Date/Time   COLORURINE AMBER (A) 07/11/2018 1826   APPEARANCEUR HAZY (A) 07/11/2018 1826   LABSPEC 1.020 07/11/2018 1826   PHURINE 5.0 07/11/2018 1826   GLUCOSEU NEGATIVE 07/11/2018 1826   GLUCOSEU NEG mg/dL 12/25/2006 2041   HGBUR NEGATIVE 07/11/2018 1826   HGBUR trace-lysed 03/05/2010 0856   BILIRUBINUR NEGATIVE 07/11/2018 1826   BILIRUBINUR negative 11/11/2011 1701   KETONESUR NEGATIVE 07/11/2018 1826   PROTEINUR >=300 (A) 07/11/2018 1826   UROBILINOGEN 0.2 11/11/2011 1701   UROBILINOGEN 0.2 11/11/2011 1645   NITRITE NEGATIVE 07/11/2018 1826   LEUKOCYTESUR SMALL (A) 07/11/2018 1826   Recent Results (from the past 240 hour(s))  Blood Culture (routine x 2)     Status: None   Collection Time: 07/10/18 10:25 AM  Result Value Ref Range Status   Specimen Description BLOOD RIGHT HAND  Final   Special Requests   Final    BOTTLES DRAWN AEROBIC AND ANAEROBIC Blood Culture results may not be optimal due to an inadequate volume of blood received in culture bottles   Culture   Final    NO GROWTH 5 DAYS Performed at [facility], [place] [address], [place], [place] [address]    Report Status 07/15/2018 FINAL  Final  SARS Coronavirus 2 (CEPHEID- Performed in [place] hospital lab), Hosp  Order     Status: Abnormal   Collection Time: 07/10/18 10:30 AM  Result Value Ref Range Status   SARS Coronavirus 2 POSITIVE (A) NEGATIVE Final    Comment: CRITICAL RESULT CALLED TO, READ BACK BY AND VERIFIED WITH: RN CONNIE V. [id number] (NOTE) If result is NEGATIVE SARS-CoV-2 target nucleic acids are NOT DETECTED. The SARS-CoV-2 RNA is generally detectable in upper and lower  respiratory specimens during the acute phase of infection. The lowest  concentration of SARS-CoV-2 viral copies this assay can detect is 250  copies / mL. A negative result does not preclude SARS-CoV-2 infection  and should not be used as the sole basis for treatment or other  patient management decisions.  A negative result may occur with  improper specimen collection / handling, submission of specimen other  than nasopharyngeal swab, presence of viral mutation(s) within the  areas targeted by this assay, and inadequate number of viral copies  (<250 copies / mL). A negative result must be combined with clinical  observations, patient history, and epidemiological information. If result is POSITIVE SARS-CoV-2 target nucleic acids are DETECTED.  The SARS-CoV-2 RNA is generally detectable in upper and lower  respiratory specimens during the acute phase of infection.  Positive  results are indicative of active infection with SARS-CoV-2.  Clinical  correlation with patient history and other diagnostic information is  necessary to determine patient infection status.  Positive results do  not rule out bacterial infection or co-infection with other viruses. If result is PRESUMPTIVE POSTIVE SARS-CoV-2 nucleic acids MAY BE PRESENT.   A presumptive positive result was obtained on the submitted specimen  and confirmed on repeat testing.  While 2019 novel coronavirus  (SARS-CoV-2) nucleic acids may be present in the submitted sample  additional confirmatory testing may be necessary for epidemiological  and / or  clinical management purposes  to differentiate between  SARS-CoV-2 and other Sarbecovirus currently known to infect humans.  If clinically indicated additional testing with an alternate test  methodology [phone]) i s advised. The SARS-CoV-2 RNA is generally  detectable in upper and lower respiratory specimens during the acute  phase  of infection. The expected result is Negative. Fact Sheet for Patients:  StrictlyIdeas.no Fact Sheet for Healthcare Providers: BankingDealers.co.za This test is not yet approved or cleared by the [place] FDA and has been authorized for detection and/or diagnosis of SARS-CoV-2 by FDA under an Emergency Use Authorization (EUA).  This EUA will remain in effect (meaning this test can be used) for the duration of the COVID-19 declaration under Section 564(b)(1) of the Act, 21 U.S.C. section 360bbb-3(b)(1), unless the authorization is terminated or revoked sooner. Performed at [facility], [place] [address] [place], [place] [address]   Blood Culture (routine x 2)     Status: None   Collection Time: 07/10/18 10:56 AM  Result Value Ref Range Status   Specimen Description BLOOD RIGHT FOREARM  Final   Special Requests   Final    BOTTLES DRAWN AEROBIC AND ANAEROBIC Blood Culture adequate volume   Culture   Final    NO GROWTH 5 DAYS Performed at [facility], [place] [address], [place], [place] [address]    Report Status 07/15/2018 FINAL  Final      Radiology Studies: No results found.  Scheduled Meds: . amLODipine  10 mg Oral Daily  . aspirin EC  81 mg Oral Daily  . atorvastatin  80 mg Oral QHS  . enoxaparin (LOVENOX) injection  30 mg Subcutaneous Q24H  . insulin aspart  0-9 Units Subcutaneous TID WC  . insulin glargine  5 Units Subcutaneous QHS  . metoprolol succinate  25 mg Oral Daily  . PARoxetine  30 mg Oral Daily  . senna-docusate  1 tablet Oral QHS  . sodium chloride flush  3 mL  Intravenous Q12H  . vitamin C  250 mg Oral BID  . zinc sulfate  220 mg Oral Daily   Continuous Infusions:    LOS: 6 days   Time spent: 25 minutes.  Patrecia Pour, MD [facility] [email or web address] Password [facility] 07/16/2018, 1:38 PM

## 2018-07-16 NOTE — Progress Notes (Signed)
CardioVascular Research Department and AHF Team  ReDS Research Project   Patient #: [id number]  ReDS Measurement  Right: 33 %  Left: 31 %

## 2018-07-16 NOTE — Progress Notes (Signed)
The pt's Daughter was called and given a daily update regarding the pt's current status.

## 2018-07-17 LAB — GLUCOSE, CAPILLARY
Glucose-Capillary: 73 mg/dL (ref 70–99)
Glucose-Capillary: 101 mg/dL — ABNORMAL HIGH (ref 70–99)

## 2018-07-17 NOTE — Progress Notes (Signed)
CardioVascular Rewsearch Department and AHF Team  ReDS Research Project   Patient #: [id number]  ReDS Measurement  Right: low quality x 3  Left:

## 2018-07-17 NOTE — Progress Notes (Signed)
Pt recently spoke to daughters x 2 and another relative.

## 2018-07-17 NOTE — Progress Notes (Signed)
PROGRESS NOTE  [name]  PYK:[id number] DOB: Nov 16, 1945 DOA: 07/10/2018 PCP: No primary care provider on file.   Brief Narrative: [name] is a 73 y.o. female with a history of breast CA s/p mastectomy, chemotherapy, HOCM, chornic HFpEF, mod AS s/p bioprosthetic AVR, T2DM, stage III CKD, and hx CVA who presented from ALF with 6 days of fever, myalgias and fatigue despite taking levaquin as prescribed by MD as outpatient for suspected pneumonia. She was seen in the ED and admitted for covid-19 infection, subsequently transferred to [facility] on 5/17.   Assessment & Plan: Principal Problem:   COVID-19 Active Problems:   Hyperlipidemia   Refusal of blood transfusions as patient is Jehovah's Witness   History of adenocarcinoma of breast   Aortic stenosis s/p Tissure AVR 2006   CAD (coronary artery disease)   Hypertension   Acute on chronic renal failure (HCC)   HOCM (hypertrophic obstructive cardiomyopathy) (HCC)   Peripheral arterial disease (HCC)   Chronic diastolic CHF (congestive heart failure) ([place])  Acute hypoxic respiratory failure: Resolved.   Covid-19 viral pneumonia: Improved, no hypoxia, inflammatory markers trending downward - Continue airborne, contact precautions. PPE including surgical gown, gloves, face shield, cap, shoe covers, and N-95 used during this encounter in a negative pressure room.  - Enoxaparin prophylactic dose, 30mg  as CrCl < 52ml/min. - Blood cultures drawn on admission. PCT low. - Maintain euvolemia/net negative.  - Avoid NSAIDs  Weakness: Nonfocal, negative CT head. Suspect due to covid infection.  - PT/OT, mobilize as much as possible. Awaiting disposition to rehabilitation.  T2DM: HbA1c 6.9% indicating good control.  - Decreased longacting insulin, will stop lantus 5u tonight, continue SSI. At inpatient goal.  - With CrCl < 80ml/min will not plan on continuing metformin at discharge.   AKI on stage III CKD: Cr improved. - Continue  holding diuretic and gave IVF. Can restart lasix at discharge. BNP 902, though this has been chronically elevated.  - Monitor UOP, I/O  Chronic HFpEF, HOCM, CAD s/p CABG, AS s/p AVR 2006:  - Continue beta blocker, ASA, statin - Monitor I/O, weights  HTN:  - Restart norvasc, cont metoprolol.  Prolonged QTc: K and Mg wnl. - Magnesium supplementation as below.  - Continue telemetry.  - Avoid azithromycin and other provocative agents.   Hypomagnesemia:  - Supplemented, recheck in AM.  Anemia of chronic disease: Hgb stable 10-11.  - Note that patient is Jehovah's Witness and would not consent to blood products were they to be indicated.  DVT prophylaxis: Lovenox Code Status: Full confirmed earlier in hospitalization Family Communication: None at bedside. Will call with any clinical updates as necessary. Disposition Plan: Stable for discharge, SNF unable to accept until 5/25. They request repeat test, ordered today   Consultants:   None  Procedures:   None  Antimicrobials:  None   Subjective: No changes, feels tired today, not hungry, denies new weakness or any numbness. No dyspnea, fever, cough.   Objective: Vitals:   07/16/18 2209 07/17/18 0532 07/17/18 0800 07/17/18 0900  BP: (!) 170/70 (!) 116/57 (!) 118/55 (!) 133/56  Pulse:  62 61 63  Resp:  14 14 13   Temp:  97.7 F (36.5 C)  97.7 F (36.5 C)  TempSrc:  Oral  Axillary  SpO2:  96% 97% 96%  Weight:  71 kg    Height:        Intake/Output Summary (Last 24 hours) at 07/17/2018 1106 Last data filed at 07/17/2018 0907 Gross per 24  hour  Intake 260 ml  Output 450 ml  Net -190 ml   Filed Weights   07/15/18 0500 07/16/18 0500 07/17/18 0532  Weight: 69.8 kg 70.1 kg 71 kg   Gen: 73 y.o. female in no distress Pulm: Nonlabored breathing room air. Clear. CV: Regular rate and rhythm. II/VI holosystolic murmur, rub, or gallop. No JVD, no dependent edema. GI: Abdomen soft, non-tender, non-distended, with normoactive  bowel sounds.  Ext: Warm, no deformities Skin: No rashes, lesions or ulcers on visualized skin. Neuro: Alert and oriented. Diffusely weak without focal neurological deficits. Psych: Judgement and insight appear fair. Mood euthymic & affect congruent. Behavior is appropriate.    Data Reviewed: I have personally reviewed following labs and imaging studies  CBC: Recent Labs  Lab 07/11/18 0631 07/12/18 0316 07/13/18 0334 07/14/18 0342 07/16/18 0543  WBC 7.8 9.7 11.1* 8.0 12.4*  NEUTROABS 6.2 8.0* 9.3* 5.8 10.6*  HGB 10.3* 10.2* 10.2* 10.5* 10.2*  HCT 31.2* 31.0* 31.6* 32.7* 31.9*  MCV 91.2 90.9 90.8 91.6 90.6  PLT 207 233 278 312 119   Basic Metabolic Panel: Recent Labs  Lab 07/11/18 0631 07/11/18 2304 07/12/18 0316 07/13/18 0334 07/14/18 0342 07/15/18 0630 07/16/18 0543  NA 137 137  --  139 137 139 139  K 4.0 3.9  --  4.1 4.0 4.6 4.0  CL 103 105  --  107 105 107 107  CO2 20* 21*  --  $R'22 22 22 24  'tO$ GLUCOSE 118* 106*  --  62* 98 93 147*  BUN 40* 45*  --  54* 60* 59* 58*  CREATININE 2.91* 2.64*  --  2.52* 2.47* 2.19* 1.97*  CALCIUM 8.2* 7.9*  --  8.1* 8.2* 8.3* 8.2*  MG 1.6*  --  1.6*  --  2.3 2.5*  --   PHOS  --   --  4.3 4.4 4.5 4.4  --    Liver Function Tests: Recent Labs  Lab 07/11/18 0631 07/11/18 2304 07/13/18 0334 07/14/18 0342 07/15/18 0630  AST 23 25 36 47* 41  ALT $Re'12 14 19 29 27  'QjD$ ALKPHOS 71 68 77 82 94  BILITOT 0.6 0.2* 0.4 0.4 0.5  PROT 5.8* 5.8* 5.9* 6.1* 6.3*  ALBUMIN 2.3* 2.3* 2.2* 2.3* 2.3*   Cardiac Enzymes: Recent Labs  Lab 07/11/18 2304 07/12/18 0316 07/13/18 0334 07/14/18 0342 07/15/18 0630  CKTOTAL  --  170 116 77 74  TROPONINI 0.07*  --   --   --   --    CBG: Recent Labs  Lab 07/16/18 1156 07/16/18 1248 07/16/18 1759 07/16/18 2056 07/17/18 0856  GLUCAP 119* 125* 92 93 73   Urine analysis:    Component Value Date/Time   COLORURINE AMBER (A) 07/11/2018 1826   APPEARANCEUR HAZY (A) 07/11/2018 1826   LABSPEC 1.020  07/11/2018 1826   PHURINE 5.0 07/11/2018 1826   GLUCOSEU NEGATIVE 07/11/2018 1826   GLUCOSEU NEG mg/dL 12/25/2006 2041   HGBUR NEGATIVE 07/11/2018 1826   HGBUR trace-lysed 03/05/2010 0856   BILIRUBINUR NEGATIVE 07/11/2018 1826   BILIRUBINUR negative 11/11/2011 1701   KETONESUR NEGATIVE 07/11/2018 1826   PROTEINUR >=300 (A) 07/11/2018 1826   UROBILINOGEN 0.2 11/11/2011 1701   UROBILINOGEN 0.2 11/11/2011 1645   NITRITE NEGATIVE 07/11/2018 1826   LEUKOCYTESUR SMALL (A) 07/11/2018 1826   Recent Results (from the past 240 hour(s))  Blood Culture (routine x 2)     Status: None   Collection Time: 07/10/18 10:25 AM  Result Value Ref Range Status  Specimen Description BLOOD RIGHT HAND  Final   Special Requests   Final    BOTTLES DRAWN AEROBIC AND ANAEROBIC Blood Culture results may not be optimal due to an inadequate volume of blood received in culture bottles   Culture   Final    NO GROWTH 5 DAYS Performed at [facility], [place] [address] [place], [place] [address]    Report Status 07/15/2018 FINAL  Final  SARS Coronavirus 2 (CEPHEID- Performed in [place] hospital lab), Hosp Order     Status: Abnormal   Collection Time: 07/10/18 10:30 AM  Result Value Ref Range Status   SARS Coronavirus 2 POSITIVE (A) NEGATIVE Final    Comment: CRITICAL RESULT CALLED TO, READ BACK BY AND VERIFIED WITH: RN CONNIE V. [id number] (NOTE) If result is NEGATIVE SARS-CoV-2 target nucleic acids are NOT DETECTED. The SARS-CoV-2 RNA is generally detectable in upper and lower  respiratory specimens during the acute phase of infection. The lowest  concentration of SARS-CoV-2 viral copies this assay can detect is 250  copies / mL. A negative result does not preclude SARS-CoV-2 infection  and should not be used as the sole basis for treatment or other  patient management decisions.  A negative result may occur with  improper specimen collection / handling, submission of specimen other  than  nasopharyngeal swab, presence of viral mutation(s) within the  areas targeted by this assay, and inadequate number of viral copies  (<250 copies / mL). A negative result must be combined with clinical  observations, patient history, and epidemiological information. If result is POSITIVE SARS-CoV-2 target nucleic acids are DETECTED.  The SARS-CoV-2 RNA is generally detectable in upper and lower  respiratory specimens during the acute phase of infection.  Positive  results are indicative of active infection with SARS-CoV-2.  Clinical  correlation with patient history and other diagnostic information is  necessary to determine patient infection status.  Positive results do  not rule out bacterial infection or co-infection with other viruses. If result is PRESUMPTIVE POSTIVE SARS-CoV-2 nucleic acids MAY BE PRESENT.   A presumptive positive result was obtained on the submitted specimen  and confirmed on repeat testing.  While 2019 novel coronavirus  (SARS-CoV-2) nucleic acids may be present in the submitted sample  additional confirmatory testing may be necessary for epidemiological  and / or clinical management purposes  to differentiate between  SARS-CoV-2 and other Sarbecovirus currently known to infect humans.  If clinically indicated additional testing with an alternate test  methodology [phone]) i s advised. The SARS-CoV-2 RNA is generally  detectable in upper and lower respiratory specimens during the acute  phase of infection. The expected result is Negative. Fact Sheet for Patients:  StrictlyIdeas.no Fact Sheet for Healthcare Providers: BankingDealers.co.za This test is not yet approved or cleared by the [place] FDA and has been authorized for detection and/or diagnosis of SARS-CoV-2 by FDA under an Emergency Use Authorization (EUA).  This EUA will remain in effect (meaning this test can be used) for the duration of the  COVID-19 declaration under Section 564(b)(1) of the Act, 21 U.S.C. section 360bbb-3(b)(1), unless the authorization is terminated or revoked sooner. Performed at [facility], [place] [address], [place], [place] [address]   Blood Culture (routine x 2)     Status: None   Collection Time: 07/10/18 10:56 AM  Result Value Ref Range Status   Specimen Description BLOOD RIGHT FOREARM  Final   Special Requests   Final  BOTTLES DRAWN AEROBIC AND ANAEROBIC Blood Culture adequate volume   Culture   Final    NO GROWTH 5 DAYS Performed at [facility], [place] [address] [place], [place] [address]    Report Status 07/15/2018 FINAL  Final      Radiology Studies: No results found.  Scheduled Meds: . amLODipine  10 mg Oral Daily  . aspirin EC  81 mg Oral Daily  . atorvastatin  80 mg Oral QHS  . enoxaparin (LOVENOX) injection  30 mg Subcutaneous Q24H  . insulin aspart  0-9 Units Subcutaneous TID WC  . insulin glargine  5 Units Subcutaneous QHS  . metoprolol succinate  25 mg Oral Daily  . PARoxetine  30 mg Oral Daily  . senna-docusate  1 tablet Oral QHS  . sodium chloride flush  3 mL Intravenous Q12H  . vitamin C  250 mg Oral BID  . zinc sulfate  220 mg Oral Daily   Continuous Infusions:    LOS: 7 days   Time spent: 25 minutes.  Patrecia Pour, MD [facility] [email or web address] Password [email or web address] 07/17/2018, 11:06 AM

## 2018-07-17 NOTE — Plan of Care (Signed)
Pt resting quietly. Awakened for breakfast. Plan of care reviewed with verbalized understanding. VSS. No distress noted.

## 2018-07-18 DIAGNOSIS — E1122 Type 2 diabetes mellitus with diabetic chronic kidney disease: Secondary | ICD-10-CM

## 2018-07-18 DIAGNOSIS — N182 Chronic kidney disease, stage 2 (mild): Secondary | ICD-10-CM

## 2018-07-18 DIAGNOSIS — N179 Acute kidney failure, unspecified: Secondary | ICD-10-CM

## 2018-07-18 DIAGNOSIS — Z794 Long term (current) use of insulin: Secondary | ICD-10-CM

## 2018-07-18 LAB — GLUCOSE, CAPILLARY
Glucose-Capillary: 91 mg/dL (ref 70–99)
Glucose-Capillary: 142 mg/dL — ABNORMAL HIGH (ref 70–99)
Glucose-Capillary: 115 mg/dL — ABNORMAL HIGH (ref 70–99)
Glucose-Capillary: 126 mg/dL — ABNORMAL HIGH (ref 70–99)

## 2018-07-18 LAB — MRSA PCR SCREENING: MRSA by PCR: NEGATIVE

## 2018-07-18 NOTE — Progress Notes (Signed)
CardioVascular Research Department and AHF Team  ReDS Research Project   Patient #: [id number]  ReDS Measurement  Right: 34 %  Left: 28 %

## 2018-07-18 NOTE — Progress Notes (Addendum)
PROGRESS NOTE                                                                                                                                                                                                             Patient Demographics:    [name], is a 73 y.o. female, DOB - 12-12-1945, OKH:[id number]  Outpatient Primary MD for the patient is No primary care provider on file.    LOS - 8  Chief Complaint  Patient presents with  . Fever       Brief Narrative: Patient is a 73 y.o. female with PMHx of breast CA s/p mastectomy, chemotherapy, HOCM, chornic HFpEF, mod AS s/p bioprosthetic AVR, T2DM, stage III CKD, and hx CVA who presented from ALF with 6 days of fever, myalgias and fatigue despite taking levaquin as prescribed by MD as outpatient for suspected pneumonia.  She was found to have acute hypoxic respiratory failure in setting of covid 19 viral pneumonia-she was managed with supportive care with significant improvement.    Subjective:    [name] today appears weak and frail-but is completely awake and alert.  She was on room air.   Assessment  & Plan :   Acute hypoxic respiratory failure secondary to Covid-19 viral pneumonia: Significantly improved and on room air.  Managed with just supportive care  Deconditioning/debility: Secondary to acute illness/coinfection-PT evaluation completed-recommendations of SNF.    T2DM: CBGs stable-continue SSI.HbA1c 6.9%  AKI on stage III CKD: AKI likely hemodynamically mediated-creatinine has improved-close to usual baseline.  Resume Lasix in the next day or so  Chronic HFpEF: Remains euvolemic on exam-resume  Lasix in the next day or so  CABG, AS s/p AVR 2006: No anginal symptoms-continue beta blocker, ASA, statin  HTN: Relatively stable-continue amlodipine and metoprolol.  Follow and adjust accordingly  Prolonged QTc: EKG unreliable-as has wide QRS  at baseline-follow periodically.   Anemia of chronic disease: Hemoglobin stable-slightly worse than usual baseline due to acute illness.  Probably has anemia related to CKD at baseline.  - Note that patient is Jehov[employer]'s Witness and would not consent to blood products were they to be indicated.   COVID-19 Labs:  Recent Labs    07/16/18 0543  DDIMER 1.47*  FERRITIN 301  CRP 4.3*    Lab Results  Component Value Date   SARSCOV2NAA POSITIVE (  A) 07/10/2018     COVID-19 Medications: None    ABG: No results found for: PHART, PCO2ART, PO2ART, HCO3, TCO2, ACIDBASEDEF, O2SAT  Condition - Stable  Family Communication  : Daughter updated over the phone  Code Status :  Full Code  Diet : NPO-NG feeds in place  Disposition Plan  :  Remain inpatient-SNF on 5/25 if bed available  Consults  :  None  Procedures  :  None  DVT Prophylaxis  :  Lovenox   Lab Results  Component Value Date   PLT 385 07/16/2018    Inpatient Medications  Scheduled Meds: . amLODipine  10 mg Oral Daily  . aspirin EC  81 mg Oral Daily  . atorvastatin  80 mg Oral QHS  . enoxaparin (LOVENOX) injection  30 mg Subcutaneous Q24H  . insulin aspart  0-9 Units Subcutaneous TID WC  . metoprolol succinate  25 mg Oral Daily  . PARoxetine  30 mg Oral Daily  . senna-docusate  1 tablet Oral QHS  . sodium chloride flush  3 mL Intravenous Q12H  . vitamin C  250 mg Oral BID  . zinc sulfate  220 mg Oral Daily   Continuous Infusions: PRN Meds:.acetaminophen  Antibiotics  :    Anti-infectives (From admission, onward)   None       Time Spent in minutes  25  [name] M.D on 07/18/2018 at 1:23 PM  To page go to [email or web address] - use universal password  [facility] -  Office  [phone]  See all Orders from today for further details  Admit date - 07/10/2018    8    Objective:   Vitals:   07/17/18 2019 07/18/18 0000 07/18/18 0400 07/18/18 0500  BP:  122/65 (!) 141/64   Pulse:  62 62    Resp:  14 13   Temp: 97.8 F (36.6 C) 97.9 F (36.6 C) 97.6 F (36.4 C)   TempSrc:  Oral Oral   SpO2:  95% 97%   Weight:    69.3 kg  Height:        Wt Readings from Last 3 Encounters:  07/18/18 69.3 kg  09/05/17 80.7 kg  07/21/17 80.7 kg     Intake/Output Summary (Last 24 hours) at 07/18/2018 1323 Last data filed at 07/18/2018 0640 Gross per 24 hour  Intake 180 ml  Output 500 ml  Net -320 ml     Physical Exam Gen Exam:Alert awake-not in any distress.  Chronically [name]/frail appearing HEENT:atraumatic, normocephalic Chest: B/L clear to auscultation anteriorly CVS:S1S2 regular Abdomen:soft non tender, non distended Extremities:no edema Neurology: Nonfocal but with generalized weakness Skin: no rash   Data Review:    CBC Recent Labs  Lab 07/12/18 0316 07/13/18 0334 07/14/18 0342 07/16/18 0543  WBC 9.7 11.1* 8.0 12.4*  HGB 10.2* 10.2* 10.5* 10.2*  HCT 31.0* 31.6* 32.7* 31.9*  PLT 233 278 312 385  MCV 90.9 90.8 91.6 90.6  MCH 29.9 29.3 29.4 29.0  MCHC 32.9 32.3 32.1 32.0  RDW 13.3 13.4 13.5 13.3  LYMPHSABS 1.0 1.1 1.2 0.5*  MONOABS 0.5 0.6 0.7 0.8  EOSABS 0.0 0.1 0.1 0.2  BASOSABS 0.0 0.0 0.0 0.0    Chemistries  Recent Labs  Lab 07/11/18 2304 07/12/18 0316 07/13/18 0334 07/14/18 0342 07/15/18 0630 07/16/18 0543  NA 137  --  139 137 139 139  K 3.9  --  4.1 4.0 4.6 4.0  CL 105  --  107 105 107 107  CO2 21*  --  $'22 22 22 24  'k$ GLUCOSE 106*  --  62* 98 93 147*  BUN 45*  --  54* 60* 59* 58*  CREATININE 2.64*  --  2.52* 2.47* 2.19* 1.97*  CALCIUM 7.9*  --  8.1* 8.2* 8.3* 8.2*  MG  --  1.6*  --  2.3 2.5*  --   AST 25  --  36 47* 41  --   ALT 14  --  $R'19 29 27  'LX$ --   ALKPHOS 68  --  77 82 94  --   BILITOT 0.2*  --  0.4 0.4 0.5  --    ------------------------------------------------------------------------------------------------------------------ No results for input(s): CHOL, HDL, LDLCALC, TRIG, CHOLHDL, LDLDIRECT in the last 72 hours.  Lab  Results  Component Value Date   HGBA1C 6.9 (H) 07/11/2018   ------------------------------------------------------------------------------------------------------------------ No results for input(s): TSH, T4TOTAL, T3FREE, THYROIDAB in the last 72 hours.  Invalid input(s): FREET3 ------------------------------------------------------------------------------------------------------------------ Recent Labs    07/16/18 0543  FERRITIN 301    Coagulation profile No results for input(s): INR, PROTIME in the last 168 hours.  Recent Labs    07/16/18 0543  DDIMER 1.47*    Cardiac Enzymes Recent Labs  Lab 07/11/18 2304  TROPONINI 0.07*   ------------------------------------------------------------------------------------------------------------------    Component Value Date/Time   BNP 902.3 (H) 07/11/2018 2102    Micro Results Recent Results (from the past 240 hour(s))  Blood Culture (routine x 2)     Status: None   Collection Time: 07/10/18 10:25 AM  Result Value Ref Range Status   Specimen Description BLOOD RIGHT HAND  Final   Special Requests   Final    BOTTLES DRAWN AEROBIC AND ANAEROBIC Blood Culture results may not be optimal due to an inadequate volume of blood received in culture bottles   Culture   Final    NO GROWTH 5 DAYS Performed at [facility], [place] [address] [place], [place] [address]    Report Status 07/15/2018 FINAL  Final  SARS Coronavirus 2 (CEPHEID- Performed in [place] hospital lab), Hosp Order     Status: Abnormal   Collection Time: 07/10/18 10:30 AM  Result Value Ref Range Status   SARS Coronavirus 2 POSITIVE (A) NEGATIVE Final    Comment: CRITICAL RESULT CALLED TO, READ BACK BY AND VERIFIED WITH: RN CONNIE V. [id number] (NOTE) If result is NEGATIVE SARS-CoV-2 target nucleic acids are NOT DETECTED. The SARS-CoV-2 RNA is generally detectable in upper and lower  respiratory specimens during the acute phase of infection. The lowest   concentration of SARS-CoV-2 viral copies this assay can detect is 250  copies / mL. A negative result does not preclude SARS-CoV-2 infection  and should not be used as the sole basis for treatment or other  patient management decisions.  A negative result may occur with  improper specimen collection / handling, submission of specimen other  than nasopharyngeal swab, presence of viral mutation(s) within the  areas targeted by this assay, and inadequate number of viral copies  (<250 copies / mL). A negative result must be combined with clinical  observations, patient history, and epidemiological information. If result is POSITIVE SARS-CoV-2 target nucleic acids are DETECTED.  The SARS-CoV-2 RNA is generally detectable in upper and lower  respiratory specimens during the acute phase of infection.  Positive  results are indicative of active infection with SARS-CoV-2.  Clinical  correlation with patient history and other diagnostic information is  necessary to determine patient infection status.  Positive results do  not rule out bacterial infection or co-infection with other viruses. If result is PRESUMPTIVE POSTIVE SARS-CoV-2 nucleic acids MAY BE PRESENT.   A presumptive positive result was obtained on the submitted specimen  and confirmed on repeat testing.  While 2019 novel coronavirus  (SARS-CoV-2) nucleic acids may be present in the submitted sample  additional confirmatory testing may be necessary for epidemiological  and / or clinical management purposes  to differentiate between  SARS-CoV-2 and other Sarbecovirus currently known to infect humans.  If clinically indicated additional testing with an alternate test  methodology [phone]) i s advised. The SARS-CoV-2 RNA is generally  detectable in upper and lower respiratory specimens during the acute  phase of infection. The expected result is Negative. Fact Sheet for Patients:  StrictlyIdeas.no Fact Sheet  for Healthcare Providers: BankingDealers.co.za This test is not yet approved or cleared by the [place] FDA and has been authorized for detection and/or diagnosis of SARS-CoV-2 by FDA under an Emergency Use Authorization (EUA).  This EUA will remain in effect (meaning this test can be used) for the duration of the COVID-19 declaration under Section 564(b)(1) of the Act, 21 U.S.C. section 360bbb-3(b)(1), unless the authorization is terminated or revoked sooner. Performed at [facility], [place] [address], [place], [place] [address]   Blood Culture (routine x 2)     Status: None   Collection Time: 07/10/18 10:56 AM  Result Value Ref Range Status   Specimen Description BLOOD RIGHT FOREARM  Final   Special Requests   Final    BOTTLES DRAWN AEROBIC AND ANAEROBIC Blood Culture adequate volume   Culture   Final    NO GROWTH 5 DAYS Performed at [facility], [place] [address], [place],  [address]    Report Status 07/15/2018 FINAL  Final    Radiology Reports Ct Head Wo Contrast  Result Date: 07/10/2018 CLINICAL DATA:  Weakness. EXAM: CT HEAD WITHOUT CONTRAST TECHNIQUE: Contiguous axial images were obtained from the base of the skull through the vertex without intravenous contrast. COMPARISON:  January 25, 2011 FINDINGS: Brain: No subdural, epidural, or subarachnoid hemorrhage. Ventricles and sulci are stable and unremarkable. Cerebellum, brainstem, and basal cisterns are normal. No mass effect or midline shift. White matter changes are noted. No acute cortical ischemia or infarct. Vascular: Calcified atherosclerosis is seen in the intracranial carotids. Skull: Normal. Negative for fracture or focal lesion. Sinuses/Orbits: No acute finding. Other: None. IMPRESSION: Chronic white matter changes. No acute intracranial abnormalities identified. Electronically Signed   By: Dorise Bullion III M.D   On: 07/10/2018 11:53   Dg Chest Port 1 View  Result Date:  07/10/2018 CLINICAL DATA:  Fever, hypoxia EXAM: PORTABLE CHEST 1 VIEW COMPARISON:  07/21/2016 FINDINGS: Lungs are essentially clear. No focal consolidation or frank interstitial edema. No pleural effusion or pneumothorax. Mild cardiomegaly.  Prosthetic aortic valve. Surgical clips in the left axilla. Median sternotomy. IMPRESSION: No evidence of acute cardiopulmonary disease. Electronically Signed   By: Julian Hy M.D.   On: 07/10/2018 11:18

## 2018-07-19 DIAGNOSIS — N189 Chronic kidney disease, unspecified: Secondary | ICD-10-CM

## 2018-07-19 DIAGNOSIS — I2510 Atherosclerotic heart disease of native coronary artery without angina pectoris: Secondary | ICD-10-CM

## 2018-07-19 LAB — GLUCOSE, CAPILLARY: Glucose-Capillary: 124 mg/dL — ABNORMAL HIGH (ref 70–99)

## 2018-07-19 MED ORDER — INSULIN DETEMIR 100 UNIT/ML FLEXPEN: 6.0000 [IU] | PEN_INJECTOR | Freq: Every day | SUBCUTANEOUS | 3 refills | Status: DC

## 2018-07-19 MED ORDER — INSULIN ASPART 100 UNIT/ML ~~LOC~~ SOLN: SUBCUTANEOUS | 11 refills | Status: DC

## 2018-07-19 NOTE — Progress Notes (Signed)
Summary: pt has been alert today, OOB to chair with 2 person assist, incont of urine, needs assist to eat, pt and family aware of transfer to [facility]. IV removed, report given to Almyra Free at [place], [name] daughter updated pt transferred to facility via [place].

## 2018-07-19 NOTE — Discharge Summary (Addendum)
PATIENT DETAILS Name: [name] Age: 73 y.o. Sex: female Date of Birth: April 25, 1945 MRN: [id number]. Admitting Physician: Allie Bossier, MD PCP:No primary care provider on file.  Admit Date: 07/10/2018 Discharge date: 07/19/2018  Recommendations for Outpatient Follow-up:  1. Follow up with PCP in 1-2 weeks 2. Please obtain BMP/CBC in one week  Admitted From:  Home  Disposition: SNF    Home Health: No  Equipment/Devices: None  Discharge Condition: Stabl  CODE STATUS: FULL CODE  Diet recommendation:  Heart Healthy / Carb Modified   Brief Summary: See H&P, Labs, Consult and Test reports for all details in brief, Patient is a 73 y.o. female with PMHx of breast CA s/p mastectomy, chemotherapy, HOCM, chornic HFpEF, mod AS s/p bioprosthetic AVR, T2DM, stage III CKD, and hx CVA who presented from ALF with 6 days of fever, myalgias and fatigue despite taking levaquin as prescribed by MD as outpatient for suspected pneumonia.  She was found to have acute hypoxic respiratory failure in setting of covid 19 viral pneumonia-she was managed with supportive care with significant improvement.  Brief Hospital Course: Acute hypoxic respiratory failure secondary to Covid-19 viral pneumonia: Significantly improved and on room air.  Managed with just supportive care  Deconditioning/debility: Secondary to acute illness/coinfection-PT evaluation completed-recommendations of SNF.    T2DM: CBGs stable-continue SSI.HbA1c 6.9%.  Holding oral hypoglycemic and Levemir on discharge as A1c only 6.9.  PCP to follow and optimize accordingly.  AKI on stage III CKD: AKI likely hemodynamically mediated-creatinine has improved-close to usual baseline.  Resume Lasix on discharge.  Please repeat electrolytes in 1 week  Chronic HFpEF: Remains euvolemic on exam-resume  Lasix on discharge  CABG, AS s/p AVR 2006: No anginal symptoms-continue beta blocker, ASA, statin  HTN: Relatively  stable-continue amlodipine and metoprolol.  Follow and adjust accordingly  Prolonged QTc: EKG unreliable-as has wide QRS at baseline-follow periodically.   Anemia of chronic disease: Hemoglobin stable-slightly worse than usual baseline due to acute illness.  Probably has anemia related to CKD at baseline.  - Note thatpatient is Jehovah's   Left voicemail for patient's daughter on 5/25  Procedures/Studies: None  Discharge Diagnoses:  Principal Problem:   COVID-19 Active Problems:   Hyperlipidemia   Refusal of blood transfusions as patient is Jehovah's Witness   History of adenocarcinoma of breast   Aortic stenosis s/p Tissure AVR 2006   CAD (coronary artery disease)   Hypertension   Acute on chronic renal failure (HCC)   HOCM (hypertrophic obstructive cardiomyopathy) (HCC)   Peripheral arterial disease (HCC)   Chronic diastolic CHF (congestive heart failure) ([place])   Discharge Instructions:  Activity:  As tolerated with Full fall precautions use walker/cane & assistance as needed   Discharge Instructions    Diet - low sodium heart healthy   Complete by:  As directed    Diet - low sodium heart healthy   Complete by:  As directed    Diet Carb Modified   Complete by:  As directed    Diet Carb Modified   Complete by:  As directed    Discharge instructions   Complete by:  As directed    Follow with Primary MD in 1 week  Please get a complete blood count and chemistry panel checked by your Primary MD at your next visit, and again as instructed by your Primary MD.  Get Medicines reviewed and adjusted: Please take all your medications with you for your next visit with your Primary MD  Laboratory/radiological data:  Please request your Primary MD to go over all hospital tests and procedure/radiological results at the follow up, please ask your Primary MD to get all Hospital records sent to his/her office.  In some cases, they will be blood work, cultures and biopsy  results pending at the time of your discharge. Please request that your primary care M.D. follows up on these results.  Also Note the following: If you experience worsening of your admission symptoms, develop shortness of breath, life threatening emergency, suicidal or homicidal thoughts you must seek medical attention immediately by calling 911 or calling your MD immediately  if symptoms less severe.  You must read complete instructions/literature along with all the possible adverse reactions/side effects for all the Medicines you take and that have been prescribed to you. Take any new Medicines after you have completely understood and accpet all the possible adverse reactions/side effects.   Do not drive when taking Pain medications or sleeping medications (Benzodaizepines)  Do not take more than prescribed Pain, Sleep and Anxiety Medications. It is not advisable to combine anxiety,sleep and pain medications without talking with your primary care practitioner  Special Instructions: If you have smoked or chewed Tobacco  in the last 2 yrs please stop smoking, stop any regular Alcohol  and or any Recreational drug use.  Wear Seat belts while driving.  Please note: You were cared for by a hospitalist during your hospital stay. Once you are discharged, your primary care physician will handle any further medical issues. Please note that NO REFILLS for any discharge medications will be authorized once you are discharged, as it is imperative that you return to your primary care physician (or establish a relationship with a primary care physician if you do not have one) for your post hospital discharge needs so that they can reassess your need for medications and monitor your lab values.   CHECK CBG'S BEFORE MEALS AND AT BEDTIME   Increase activity slowly   Complete by:  As directed    Increase activity slowly   Complete by:  As directed    MyChart COVID-19 home monitoring program   Complete by:  Jul 19, 2018    Is the patient willing to use the MyChart Mobile App for home monitoring?:  No     Allergies as of 07/19/2018      Reactions   Other Other (See Comments)   NO "blood products," as the patient is a Jehovah's Witness      Medication List    STOP taking these medications   Insulin Detemir 100 UNIT/ML Pen Commonly known as:  Levemir FlexTouch   levofloxacin 500 MG tablet Commonly known as:  LEVAQUIN   metFORMIN 1000 MG tablet Commonly known as:  GLUCOPHAGE   naproxen sodium 220 MG tablet Commonly known as:  ALEVE     TAKE these medications   Accu-Chek FastClix Lancets Misc Use to check blood sugar up to 3 times a day   acetaminophen 325 MG tablet Commonly known as:  TYLENOL Take 650 mg by mouth 4 (four) times daily as needed for mild pain or headache.   amLODipine 10 MG tablet Commonly known as:  NORVASC Take 1 tablet (10 mg total) by mouth daily.   aspirin 81 MG EC tablet TAKE 1 TABLET (81 MG TOTAL) BY MOUTH DAILY. SWALLOW WHOLE. What changed:    how much to take  how to take this  when to take this   atorvastatin 80 MG tablet Commonly known as:  LIPITOR Take 1 tablet (80 mg total) by mouth daily at 6 PM. What changed:  when to take this   docusate sodium 100 MG capsule Commonly known as:  COLACE Take 200 mg by mouth daily as needed for mild constipation.   eucerin lotion Apply topically 2 (two) times daily.   furosemide 40 MG tablet Commonly known as:  LASIX Take 1 tablet (40 mg total) by mouth 2 (two) times daily.   glucose blood test strip Commonly known as:  Accu-Chek Guide Check blood sugar 3 times a day as instructed   guaiFENesin 600 MG 12 hr tablet Commonly known as:  MUCINEX Take 600 mg by mouth 2 (two) times daily.   guaiFENesin 100 MG/5ML Soln Commonly known as:  ROBITUSSIN Take 5 mLs (100 mg total) by mouth every 4 (four) hours as needed for cough or to loosen phlegm.   hydrALAZINE 50 MG tablet Commonly known as:   APRESOLINE Take 1 tablet (50 mg total) by mouth 2 (two) times daily.   insulin aspart 100 UNIT/ML injection Commonly known as:  novoLOG 0-9 Units, Subcutaneous, 3 times daily with meals CBG < 70: implement hypoglycemia protocol CBG 70 - 120: 0 units CBG 121 - 150: 1 unit CBG 151 - 200: 2 units CBG 201 - 250: 3 units CBG 251 - 300: 5 units CBG 301 - 350: 7 units CBG 351 - 400: 9 units CBG > 400: call MD   Loratadine 10 MG Caps Take 1 capsule (10 mg total) by mouth daily. What changed:  when to take this   metoprolol succinate 25 MG 24 hr tablet Commonly known as:  TOPROL-XL Take 1 tablet (25 mg total) by mouth daily. What changed:  when to take this   PARoxetine 30 MG tablet Commonly known as:  PAXIL Take 1 tablet (30 mg total) by mouth daily.   Pen Needles 3/16" 31G X 5 MM Misc 1 each by Does not apply route 4 (four) times daily. Use to check blood sugars 4 times a day. Dx code E11.22   polyethylene glycol 17 g packet Commonly known as:  MIRALAX / GLYCOLAX Take 17 g by mouth daily. What changed:    when to take this  reasons to take this   saccharomyces boulardii 250 MG capsule Commonly known as:  FLORASTOR Take 250 mg by mouth 2 (two) times daily.   sennosides-docusate sodium 8.6-50 MG tablet Commonly known as:  SENOKOT-S Take 1 tablet by mouth at bedtime.   tetrahydrozoline 0.05 % ophthalmic solution Place 1 drop into both eyes as needed (dry eyes).      Follow-up Information    Dorothy Spark, MD Follow up.   Specialty:  Cardiology Contact information: [place] [address] [phone]          Allergies  Allergen Reactions  . 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," as the patient is a Sales promotion account executive Witness    Consultations:   None  Other Procedures/Studies: Ct Head Wo Contrast  Result Date: 07/10/2018 CLINICAL DATA:  Weakness. EXAM: CT HEAD WITHOUT CONTRAST TECHNIQUE: Contiguous axial images  were obtained from the base of the skull through the vertex without intravenous contrast. COMPARISON:  January 25, 2011 FINDINGS: Brain: No subdural, epidural, or subarachnoid hemorrhage. Ventricles and sulci are stable and unremarkable. Cerebellum, brainstem, and basal cisterns are normal. No mass effect or midline shift. White matter changes are noted. No acute cortical ischemia or infarct. Vascular: Calcified  atherosclerosis is seen in the intracranial carotids. Skull: Normal. Negative for fracture or focal lesion. Sinuses/Orbits: No acute finding. Other: None. IMPRESSION: Chronic white matter changes. No acute intracranial abnormalities identified. Electronically Signed   By: Gerome Sam III M.D   On: 07/10/2018 11:53   Dg Chest Port 1 View  Result Date: 07/10/2018 CLINICAL DATA:  Fever, hypoxia EXAM: PORTABLE CHEST 1 VIEW COMPARISON:  07/21/2016 FINDINGS: Lungs are essentially clear. No focal consolidation or frank interstitial edema. No pleural effusion or pneumothorax. Mild cardiomegaly.  Prosthetic aortic valve. Surgical clips in the left axilla. Median sternotomy. IMPRESSION: No evidence of acute cardiopulmonary disease. Electronically Signed   By: Charline Bills M.D.   On: 07/10/2018 11:18     TODAY-DAY OF DISCHARGE:  Subjective:   Lovell Roe today has no headache,no chest abdominal pain,no new weakness tingling or numbness, feels much better wants to go home today.   Objective:   Blood pressure (!) 151/70, pulse 67, temperature 98 F (36.7 C), temperature source Oral, resp. rate (!) 23, height 5\' 6"  (1.676 m), weight 68 kg, SpO2 96 %.  Intake/Output Summary (Last 24 hours) at 07/19/2018 1023 Last data filed at 07/18/2018 1849 Gross per 24 hour  Intake -  Output 600 ml  Net -600 ml   Filed Weights   07/17/18 0532 07/18/18 0500 07/19/18 0318  Weight: 71 kg 69.3 kg 68 kg    Exam: Awake Alert, Oriented *3, No new F.N deficits, Normal affect [place].AT,PERRAL Supple  Neck,No JVD, No cervical lymphadenopathy appriciated.  Symmetrical Chest wall movement, Good air movement bilaterally, CTAB RRR,No Gallops,Rubs or new Murmurs, No Parasternal Heave +ve B.Sounds, Abd Soft, Non tender, No organomegaly appriciated, No rebound -guarding or rigidity. No Cyanosis, Clubbing or edema, No new Rash or bruise   PERTINENT RADIOLOGIC STUDIES: Ct Head Wo Contrast  Result Date: 07/10/2018 CLINICAL DATA:  Weakness. EXAM: CT HEAD WITHOUT CONTRAST TECHNIQUE: Contiguous axial images were obtained from the base of the skull through the vertex without intravenous contrast. COMPARISON:  January 25, 2011 FINDINGS: Brain: No subdural, epidural, or subarachnoid hemorrhage. Ventricles and sulci are stable and unremarkable. Cerebellum, brainstem, and basal cisterns are normal. No mass effect or midline shift. White matter changes are noted. No acute cortical ischemia or infarct. Vascular: Calcified atherosclerosis is seen in the intracranial carotids. Skull: Normal. Negative for fracture or focal lesion. Sinuses/Orbits: No acute finding. Other: None. IMPRESSION: Chronic white matter changes. No acute intracranial abnormalities identified. Electronically Signed   By: January 27, 2011 III M.D   On: 07/10/2018 11:53   Dg Chest Port 1 View  Result Date: 07/10/2018 CLINICAL DATA:  Fever, hypoxia EXAM: PORTABLE CHEST 1 VIEW COMPARISON:  07/21/2016 FINDINGS: Lungs are essentially clear. No focal consolidation or frank interstitial edema. No pleural effusion or pneumothorax. Mild cardiomegaly.  Prosthetic aortic valve. Surgical clips in the left axilla. Median sternotomy. IMPRESSION: No evidence of acute cardiopulmonary disease. Electronically Signed   By: 07/23/2016 M.D.   On: 07/10/2018 11:18     PERTINENT LAB RESULTS: CBC: No results for input(s): WBC, HGB, HCT, PLT in the last 72 hours. CMET CMP     Component Value Date/Time   NA 139 07/16/2018 0543   NA 141 04/30/2017 1234   K  4.0 07/16/2018 0543   CL 107 07/16/2018 0543   CO2 24 07/16/2018 0543   GLUCOSE 147 (H) 07/16/2018 0543   BUN 58 (H) 07/16/2018 0543   BUN 18 04/30/2017 1234   CREATININE 1.97 (H) 07/16/2018 0543  CREATININE 0.92 06/07/2014 1151   CALCIUM 8.2 (L) 07/16/2018 0543   PROT 6.3 (L) 07/15/2018 0630   PROT 7.4 11/08/2015 1151   ALBUMIN 2.3 (L) 07/15/2018 0630   ALBUMIN 4.2 11/08/2015 1151   AST 41 07/15/2018 0630   ALT 27 07/15/2018 0630   ALKPHOS 94 07/15/2018 0630   BILITOT 0.5 07/15/2018 0630   BILITOT 0.3 11/08/2015 1151   GFRNONAA 24 (L) 07/16/2018 0543   GFRNONAA 63 06/07/2014 1151   GFRAA 28 (L) 07/16/2018 0543   GFRAA 73 06/07/2014 1151    GFR Estimated Creatinine Clearance: 23.5 mL/min (A) (by C-G formula based on SCr of 1.97 mg/dL (H)). No results for input(s): LIPASE, AMYLASE in the last 72 hours. No results for input(s): CKTOTAL, CKMB, CKMBINDEX, TROPONINI in the last 72 hours. Invalid input(s): POCBNP No results for input(s): DDIMER in the last 72 hours. No results for input(s): HGBA1C in the last 72 hours. No results for input(s): CHOL, HDL, LDLCALC, TRIG, CHOLHDL, LDLDIRECT in the last 72 hours. No results for input(s): TSH, T4TOTAL, T3FREE, THYROIDAB in the last 72 hours.  Invalid input(s): FREET3 No results for input(s): VITAMINB12, FOLATE, FERRITIN, TIBC, IRON, RETICCTPCT in the last 72 hours. Coags: No results for input(s): INR in the last 72 hours.  Invalid input(s): PT Microbiology: Recent Results (from the past 240 hour(s))  Blood Culture (routine x 2)     Status: None   Collection Time: 07/10/18 10:25 AM  Result Value Ref Range Status   Specimen Description BLOOD RIGHT HAND  Final   Special Requests   Final    BOTTLES DRAWN AEROBIC AND ANAEROBIC Blood Culture results may not be optimal due to an inadequate volume of blood received in culture bottles   Culture   Final    NO GROWTH 5 DAYS Performed at [facility], [place] [address],  [place], [place] [address]    Report Status 07/15/2018 FINAL  Final  SARS Coronavirus 2 (CEPHEID- Performed in [place] hospital lab), Hosp Order     Status: Abnormal   Collection Time: 07/10/18 10:30 AM  Result Value Ref Range Status   SARS Coronavirus 2 POSITIVE (A) NEGATIVE Final    Comment: CRITICAL RESULT CALLED TO, READ BACK BY AND VERIFIED WITH: RN CONNIE V. [id number] (NOTE) If result is NEGATIVE SARS-CoV-2 target nucleic acids are NOT DETECTED. The SARS-CoV-2 RNA is generally detectable in upper and lower  respiratory specimens during the acute phase of infection. The lowest  concentration of SARS-CoV-2 viral copies this assay can detect is 250  copies / mL. A negative result does not preclude SARS-CoV-2 infection  and should not be used as the sole basis for treatment or other  patient management decisions.  A negative result may occur with  improper specimen collection / handling, submission of specimen other  than nasopharyngeal swab, presence of viral mutation(s) within the  areas targeted by this assay, and inadequate number of viral copies  (<250 copies / mL). A negative result must be combined with clinical  observations, patient history, and epidemiological information. If result is POSITIVE SARS-CoV-2 target nucleic acids are DETECTED.  The SARS-CoV-2 RNA is generally detectable in upper and lower  respiratory specimens during the acute phase of infection.  Positive  results are indicative of active infection with SARS-CoV-2.  Clinical  correlation with patient history and other diagnostic information is  necessary to determine patient infection status.  Positive results do  not rule out bacterial infection or co-infection  with other viruses. If result is PRESUMPTIVE POSTIVE SARS-CoV-2 nucleic acids MAY BE PRESENT.   A presumptive positive result was obtained on the submitted specimen  and confirmed on repeat testing.  While 2019 novel coronavirus  (SARS-CoV-2)  nucleic acids may be present in the submitted sample  additional confirmatory testing may be necessary for epidemiological  and / or clinical management purposes  to differentiate between  SARS-CoV-2 and other Sarbecovirus currently known to infect humans.  If clinically indicated additional testing with an alternate test  methodology [phone]) i s advised. The SARS-CoV-2 RNA is generally  detectable in upper and lower respiratory specimens during the acute  phase of infection. The expected result is Negative. Fact Sheet for Patients:  StrictlyIdeas.no Fact Sheet for Healthcare Providers: BankingDealers.co.za This test is not yet approved or cleared by the [place] FDA and has been authorized for detection and/or diagnosis of SARS-CoV-2 by FDA under an Emergency Use Authorization (EUA).  This EUA will remain in effect (meaning this test can be used) for the duration of the COVID-19 declaration under Section 564(b)(1) of the Act, 21 U.S.C. section 360bbb-3(b)(1), unless the authorization is terminated or revoked sooner. Performed at [facility], [place] [address], [place], [place] [address]   Blood Culture (routine x 2)     Status: None   Collection Time: 07/10/18 10:56 AM  Result Value Ref Range Status   Specimen Description BLOOD RIGHT FOREARM  Final   Special Requests   Final    BOTTLES DRAWN AEROBIC AND ANAEROBIC Blood Culture adequate volume   Culture   Final    NO GROWTH 5 DAYS Performed at [facility], [place] [address], [place], [place] [address]    Report Status 07/15/2018 FINAL  Final  Novel Coronavirus, NAA (hospital order; send-out to ref lab)     Status: Abnormal   Collection Time: 07/17/18 11:06 AM  Result Value Ref Range Status   SARS-CoV-2, NAA DETECTED (A) NOT DETECTED Final    Comment: (NOTE) Testing was performed using the cobas(R) SARS-CoV-2 test. This test was developed and its performance  characteristics determined by [employer]. This test has not been FDA cleared or approved. This test has been authorized by FDA under an Emergency Use Authorization (EUA). This test is only authorized for the duration of time the declaration that circumstances exist justifying the authorization of the emergency use of in vitro diagnostic tests for detection of SARS-CoV-2 virus and/or diagnosis of COVID-19 infection under section 564(b)(1) of the Act, 21 U.S.C. 433IRJ-1(O)(8), unless the authorization is terminated or revoked sooner. When diagnostic testing is negative, the possibility of a false negative result should be considered in the context of a patient's recent exposures and the presence of clinical signs and symptoms consistent with COVID-19. An individual without symptoms of COVID-19 and who is not shedding SARS-CoV-2 virus would expect to have  a negative (not detected) result in this assay. Performed At: [facility] [place], [place] [id number] Rush Farmer MD SW:[id number]    [place]  Final    Comment: Performed at [place] [address], [place], [place] [address]  MRSA PCR Screening     Status: None   Collection Time: 07/18/18 12:00 PM  Result Value Ref Range Status   MRSA by PCR NEGATIVE NEGATIVE Final    Comment:        The GeneXpert MRSA Assay (FDA approved for NASAL specimens only), is one component of a  comprehensive MRSA colonization surveillance program. It is not intended to diagnose MRSA infection nor to guide or monitor treatment for MRSA infections. Performed at [facility], [place] [address] [place], [place] [address]     FURTHER DISCHARGE INSTRUCTIONS:  Get Medicines reviewed and adjusted: Please take all your medications with you for your next visit with your Primary MD  Laboratory/radiological data: Please request your Primary MD to go over  all hospital tests and procedure/radiological results at the follow up, please ask your Primary MD to get all Hospital records sent to his/her office.  In some cases, they will be blood work, cultures and biopsy results pending at the time of your discharge. Please request that your primary care M.D. goes through all the records of your hospital data and follows up on these results.  Also Note the following: If you experience worsening of your admission symptoms, develop shortness of breath, life threatening emergency, suicidal or homicidal thoughts you must seek medical attention immediately by calling 911 or calling your MD immediately  if symptoms less severe.  You must read complete instructions/literature along with all the possible adverse reactions/side effects for all the Medicines you take and that have been prescribed to you. Take any new Medicines after you have completely understood and accpet all the possible adverse reactions/side effects.   Do not drive when taking Pain medications or sleeping medications (Benzodaizepines)  Do not take more than prescribed Pain, Sleep and Anxiety Medications. It is not advisable to combine anxiety,sleep and pain medications without talking with your primary care practitioner  Special Instructions: If you have smoked or chewed Tobacco  in the last 2 yrs please stop smoking, stop any regular Alcohol  and or any Recreational drug use.  Wear Seat belts while driving.  Please note: You were cared for by a hospitalist during your hospital stay. Once you are discharged, your primary care physician will handle any further medical issues. Please note that NO REFILLS for any discharge medications will be authorized once you are discharged, as it is imperative that you return to your primary care physician (or establish a relationship with a primary care physician if you do not have one) for your post hospital discharge needs so that they can reassess your need  for medications and monitor your lab values.  Total Time spent coordinating discharge including counseling, education and face to face time equals 35 minutes.  SignedOren Binet 07/19/2018 10:23 AM

## 2018-07-19 NOTE — TOC Progression Note (Signed)
Transition of Care [facility]) - Progression Note    Patient Details  Name: [name] MRN: [id number] Date of Birth: 11-Aug-1945  Transition [facility]) CM/SW [place], [place] Phone Number: 07/19/2018, 9:11 AM  Clinical Narrative:    Bed available at [facility] today, confirmed with admissions director Suanne Marker.   Will page attending MD with this update.   Expected Discharge Plan: Assisted Living Barriers to Discharge: Continued Medical Work up  Expected Discharge Plan and Services Expected Discharge Plan: Assisted Living     Post Acute Care Choice: (ALF) Living arrangements for the past 2 months: [place] Expected Discharge Date: 07/16/18                                     Social Determinants of Health (SDOH) Interventions    Readmission Risk Interventions No flowsheet data found.

## 2018-07-19 NOTE — Progress Notes (Signed)
Spoke with daughters [name] and Anne Ng pt able to speak with them also

## 2018-07-19 NOTE — TOC Transition Note (Addendum)
Transition of Care [facility]) - CM/SW Discharge Note   Patient Details  Name: [name] MRN: [id number] Date of Birth: 05/12/1945  Transition of [facility]) CM/SW Contact:  Alexander Mt, [place] Phone Number: 07/19/2018, 11:26 AM   Clinical Narrative:    Pt medically stable for transition to SNF.  [facility] has bed available. Pt daughter aware bed available and pt stable per MD. Damaris Schooner with bedside RN and [facility] scheduled for 1:30pm at RN request. Medical Necessity form printed.   Pt daughter [name] provided with facility information and number.  [name] had questions about memory loss related to coronavirus- CSW directed her to call RN for any additional clinical questions.    Final next level of care: Skilled Nursing Facility Barriers to Discharge: Barriers Resolved   Patient Goals and CMS Choice Patient states their goals for this hospitalization and ongoing recovery are:: for her to get better CMS Medicare.gov Compare Post Acute Care list provided to:: Patient Represenative (must comment) Choice offered to / list presented to : Adult Children([name])  Discharge Placement   Existing PASRR number confirmed : 07/14/18          Patient chooses bed at: [facility] [place] Patient to be transferred to facility by: [place] Name of family member notified: pt daughter [name] contacted via telephone Patient and family notified of of transfer: 07/19/18  Discharge Plan and Services Discharge Plan: [place]    Social Determinants of Health (SDOH) Interventions     Readmission Risk Interventions No flowsheet data found.

## 2018-07-19 NOTE — Social Work (Signed)
Clinical Social Worker facilitated patient discharge including contacting patient family and facility to confirm patient discharge plans.  Clinical information faxed to facility and family agreeable with plan.  CSW arranged ambulance transport via PTAR to [facility], pick up at 1:30pm.  RN to call [phone] with report prior to discharge.  Clinical Social Worker will sign off for now as social work intervention is no longer needed. Please consult [place] again if new need arises.  Westley Hummer, MSW, [place] Social Worker [phone]

## 2018-07-19 NOTE — Progress Notes (Signed)
CardioVascular Research Department and AHF Team  ReDS Research Project   Patient #: [id number]  ReDS Measurement  Right: 29 %  Left: 27 %

## 2018-07-19 NOTE — Discharge Instructions (Signed)
Person Under Monitoring Name: [name]  Location: [place] [address]   Infection Prevention Recommendations for Individuals Confirmed to have, or Being Evaluated for, 2019 Novel Coronavirus (COVID-19) Infection Who Receive Care at Home  Individuals who are confirmed to have, or are being evaluated for, COVID-19 should follow the prevention steps below until a healthcare provider or local or state health department says they can return to normal activities.  Stay home except to get medical care You should restrict activities outside your home, except for getting medical care. Do not go to work, school, or public areas, and do not use public transportation or taxis.  Call ahead before visiting your doctor Before your medical appointment, call the healthcare provider and tell them that you have, or are being evaluated for, COVID-19 infection. This will help the healthcare providers office take steps to keep other people from getting infected. Ask your healthcare provider to call the local or state health department.  Monitor your symptoms Seek prompt medical attention if your illness is worsening (e.g., difficulty breathing). Before going to your medical appointment, call the healthcare provider and tell them that you have, or are being evaluated for, COVID-19 infection. Ask your healthcare provider to call the local or state health department.  Wear a facemask You should wear a facemask that covers your nose and mouth when you are in the same room with other people and when you visit a healthcare provider. People who live with or visit you should also wear a facemask while they are in the same room with you.  Separate yourself from other people in your home As much as possible, you should stay in a different room from other people in your home. Also, you should use a separate bathroom, if available.  Avoid sharing household items You should not  share dishes, drinking glasses, cups, eating utensils, towels, bedding, or other items with other people in your home. After using these items, you should wash them thoroughly with soap and water.  Cover your coughs and sneezes Cover your mouth and nose with a tissue when you cough or sneeze, or you can cough or sneeze into your sleeve. Throw used tissues in a lined trash can, and immediately wash your hands with soap and water for at least 20 seconds or use an alcohol-based hand rub.  Wash your [facility] your hands often and thoroughly with soap and water for at least 20 seconds. You can use an alcohol-based hand sanitizer if soap and water are not available and if your hands are not visibly dirty. Avoid touching your eyes, nose, and mouth with unwashed hands.   Prevention Steps for Caregivers and Household Members of Individuals Confirmed to have, or Being Evaluated for, COVID-19 Infection Being Cared for in the Home  If you live with, or provide care at home for, a person confirmed to have, or being evaluated for, COVID-19 infection please follow these guidelines to prevent infection:  Follow healthcare providers instructions Make sure that you understand and can help the patient follow any healthcare provider instructions for all care.  Provide for the patients basic needs You should help the patient with basic needs in the home and provide support for getting groceries, prescriptions, and other personal needs.  Monitor the patients symptoms If they are getting sicker, call his or her medical provider and tell them that the patient has, or is being evaluated for, COVID-19 infection. This will help the healthcare  providers office take steps to keep other people from getting infected. Ask the healthcare provider to call the local or state health department.  Limit the number of people who have contact with the patient  If possible, have only one caregiver for the  patient.  Other household members should stay in another home or place of residence. If this is not possible, they should stay  in another room, or be separated from the patient as much as possible. Use a separate bathroom, if available.  Restrict visitors who do not have an essential need to be in the home.  Keep older adults, very young children, and other sick people away from the patient Keep older adults, very young children, and those who have compromised immune systems or chronic health conditions away from the patient. This includes people with chronic heart, lung, or kidney conditions, diabetes, and cancer.  Ensure good ventilation Make sure that shared spaces in the home have good air flow, such as from an air conditioner or an opened window, weather permitting.  Wash your hands often  Wash your hands often and thoroughly with soap and water for at least 20 seconds. You can use an alcohol based hand sanitizer if soap and water are not available and if your hands are not visibly dirty.  Avoid touching your eyes, nose, and mouth with unwashed hands.  Use disposable paper towels to dry your hands. If not available, use dedicated cloth towels and replace them when they become wet.  Wear a facemask and gloves  Wear a disposable facemask at all times in the room and gloves when you touch or have contact with the patients blood, body fluids, and/or secretions or excretions, such as sweat, saliva, sputum, nasal mucus, vomit, urine, or feces.  Ensure the mask fits over your nose and mouth tightly, and do not touch it during use.  Throw out disposable facemasks and gloves after using them. Do not reuse.  Wash your hands immediately after removing your facemask and gloves.  If your personal clothing becomes contaminated, carefully remove clothing and launder. Wash your hands after handling contaminated clothing.  Place all used disposable facemasks, gloves, and other waste in a lined  container before disposing them with other household waste.  Remove gloves and wash your hands immediately after handling these items.  Do not share dishes, glasses, or other household items with the patient  Avoid sharing household items. You should not share dishes, drinking glasses, cups, eating utensils, towels, bedding, or other items with a patient who is confirmed to have, or being evaluated for, COVID-19 infection.  After the person uses these items, you should wash them thoroughly with soap and water.  Wash laundry thoroughly  Immediately remove and wash clothes or bedding that have blood, body fluids, and/or secretions or excretions, such as sweat, saliva, sputum, nasal mucus, vomit, urine, or feces, on them.  Wear gloves when handling laundry from the patient.  Read and follow directions on labels of laundry or clothing items and detergent. In general, wash and dry with the warmest temperatures recommended on the label.  Clean all areas the individual has used often  Clean all touchable surfaces, such as counters, tabletops, doorknobs, bathroom fixtures, toilets, phones, keyboards, tablets, and bedside tables, every day. Also, clean any surfaces that may have blood, body fluids, and/or secretions or excretions on them.  Wear gloves when cleaning surfaces the patient has come in contact with.  Use a diluted bleach solution (e.g., dilute bleach with  1 part bleach and 10 parts water) or a household disinfectant with a label that says EPA-registered for coronaviruses. To make a bleach solution at home, add 1 tablespoon of bleach to 1 quart (4 cups) of water. For a larger supply, add  cup of bleach to 1 gallon (16 cups) of water.  Read labels of cleaning products and follow recommendations provided on product labels. Labels contain instructions for safe and effective use of the cleaning product including precautions you should take when applying the product, such as wearing gloves or  eye protection and making sure you have good ventilation during use of the product.  Remove gloves and wash hands immediately after cleaning.  Monitor yourself for signs and symptoms of illness Caregivers and household members are considered close contacts, should monitor their health, and will be asked to limit movement outside of the home to the extent possible. Follow the monitoring steps for close contacts listed on the symptom monitoring form.   ? If you have additional questions, contact your local health department or call the epidemiologist on call at [phone] (available 24/7). ? This guidance is subject to change. For the most up-to-date guidance from [facility], please refer to their website: YouBlogs.pl      Person Under Monitoring Name: [name]  Location: [address] [place] [address]   CORONAVIRUS DISEASE 2019 (COVID-19) Guidance for Persons Under Investigation You are being tested for the virus that causes coronavirus disease 2019 (COVID-19). Public health actions are necessary to ensure protection of your health and the health of others, and to prevent further spread of infection. COVID-19 is caused by a virus that can cause symptoms, such as fever, cough, and shortness of breath. The primary transmission from person to person is by coughing or sneezing. On March 25, 2018, the [place] announced a [employer] Emergency of International Concern and on March 26, 2018 the U.S. Department of Health and Human Services declared a public health emergency. If the virus that causesCOVID-19 spreads in the community, it could have severe public health consequences.  As a person under investigation for COVID-19, the [place] advises you to adhere to the following guidance until your test results are reported to  you. If your test result is positive, you will receive additional information from your provider and your local health department at that time.   Remain at home until you are cleared by your health provider or public health authorities.   Keep a log of visitors to your home using the form provided. Any visitors to your home must be aware of your isolation status.  If you plan to move to a new address or leave the county, notify the local health department in your county.  Call a doctor or seek care if you have an urgent medical need. Before seeking medical care, call ahead and get instructions from the provider before arriving at the medical office, clinic or hospital. Notify them that you are being tested for the virus that causes COVID-19 so arrangements can be made, as necessary, to prevent transmission to others in the healthcare setting. Next, notify the local health department in your county.  If a medical emergency arises and you need to call 911, inform the first responders that you are being tested for the virus that causes COVID-19. Next, notify the local health department in your county.  Adhere to all guidance set forth by  the [place] for Home Care of patients that is based on guidance from the Center for Disease Control and Prevention with suspected or confirmed COVID-19. It is provided with this guidance for Persons Under Investigation.  Your health and the health of our community are our top priorities. Public Health officials remain available to provide assistance and counseling to you about COVID-19 and compliance with this guidance.  Provider: ____________________________________________________________ Date: ______/_____/_________  By signing below, you acknowledge that you have read and agree to comply with this Guidance for Persons Under Investigation. ______________________________________________________________ Date:  ______/_____/_________  WHO DO I CALL? You can find a list of local health departments here: [email or web address] Health Department: ____________________________________________________________________ Contact Name: ________________________________________________________________________ Telephone: ___________________________________________________________________________  Marice Potter, [place], Communicable Disease Branch COVID-19 Guidance for Persons Under Investigation May 01, 2018

## 2018-07-19 NOTE — Social Work (Signed)
Received an additional call from bedside RN. Pt daughter [name] had a question regarding going to a facility.  We discussed that pt is discharging to [facility] because she is still positive for COVID but medically stable to transition to SNF per MD. [name] had questions about next steps after [place] including if she could qualify for financial assistance if she took pt home instead of back to [facility]. Discussed long wait list to be compensated for taking care of family but that there could be benefits if pt has Medicaid and Medicare.   Pt daughter also had question regarding if pt could contract COVID again, expressed that CSW could not provide any medical guidance. Pt daughter requested MD call her, MD given this message.  CSW signing off. Please consult if any additional needs arise.  Alexander Mt, [place] Work [phone]

## 2018-07-20 LAB — GLUCOSE, CAPILLARY: Glucose-Capillary: 115 mg/dL — ABNORMAL HIGH (ref 70–99)

## 2018-07-21 LAB — NOVEL CORONAVIRUS, NAA (HOSP ORDER, SEND-OUT TO REF LAB; TAT 18-24 HRS): SARS-CoV-2, NAA: DETECTED — AB

## 2018-07-23 ENCOUNTER — Telehealth (INDEPENDENT_AMBULATORY_CARE_PROVIDER_SITE_OTHER): Payer: Self-pay | Admitting: General Practice

## 2018-07-23 NOTE — Telephone Encounter (Signed)
LM for pt to call back for monitoring thru web based mychart - pt declined mobile app. No email on file.

## 2018-08-26 ENCOUNTER — Telehealth: Payer: Self-pay

## 2018-08-26 NOTE — Telephone Encounter (Signed)
I would be happy to resume as her PCP.  However, I do not make an home visits so I would only be able to be her PCP if she is able to come in for appointments.  The exception is that she is hospice.  If she has hospice, I will be happy to be the attending of record.  Have they investigated physicians that make house calls?  Or ambulance transportation?

## 2018-08-26 NOTE — Telephone Encounter (Signed)
Received TC from Honor Junes, RN with [facility].  She states Pt is no longer at [facility] and is at home, living with one of her daughters, Paul Dykes.  [place] RN states she is wanting to know if Dr. Lynnae January would be willing to be PCP for pt again.  Pt has not been seen at [facility] since 06/18/2017 and has a different PCP listed. Pt was a n/s on 07/28/17, LTR out on 07/28/17.  [place] RN states pt is homebound and would be unable to come in for appt's.  Will forward to Dr. Lynnae January to advise. SChaplin, RN,BSN

## 2018-08-30 NOTE — Telephone Encounter (Signed)
TC to Aimira at [facility], generic VM left to call [facility] triage R/T previous message. SChaplin, RN,BSN

## 2018-08-30 NOTE — Telephone Encounter (Signed)
[facility] is returning call please contact them [phone]

## 2018-08-31 NOTE — Telephone Encounter (Signed)
Rtc, it was to confirm that [facility], dr Software engineer would be willing to re establish care for pt, rep from [facility] was informed that family would need to arrange for transporting pt from home to office visits. She ask about virtual visits and was informed, no imc does not have those available. She was also made aware of hospice and palliative care, possibly care connections. She was informed if family does wish to re establish they would need to call and ask for office manager, doriss.

## 2018-09-01 ENCOUNTER — Encounter: Payer: Self-pay | Admitting: Internal Medicine

## 2018-09-01 ENCOUNTER — Other Ambulatory Visit: Payer: Self-pay

## 2018-09-01 ENCOUNTER — Ambulatory Visit (INDEPENDENT_AMBULATORY_CARE_PROVIDER_SITE_OTHER): Payer: Medicare Other | Admitting: Internal Medicine

## 2018-09-01 VITALS — BP 154/62 | HR 66 | Temp 97.8°F | Wt 164.4 lb

## 2018-09-01 DIAGNOSIS — I5032 Chronic diastolic (congestive) heart failure: Secondary | ICD-10-CM

## 2018-09-01 DIAGNOSIS — Z8619 Personal history of other infectious and parasitic diseases: Secondary | ICD-10-CM | POA: Diagnosis not present

## 2018-09-01 DIAGNOSIS — E1122 Type 2 diabetes mellitus with diabetic chronic kidney disease: Secondary | ICD-10-CM | POA: Diagnosis not present

## 2018-09-01 DIAGNOSIS — E1129 Type 2 diabetes mellitus with other diabetic kidney complication: Secondary | ICD-10-CM | POA: Diagnosis not present

## 2018-09-01 DIAGNOSIS — Z951 Presence of aortocoronary bypass graft: Secondary | ICD-10-CM

## 2018-09-01 DIAGNOSIS — F325 Major depressive disorder, single episode, in full remission: Secondary | ICD-10-CM

## 2018-09-01 DIAGNOSIS — N182 Chronic kidney disease, stage 2 (mild): Secondary | ICD-10-CM | POA: Diagnosis not present

## 2018-09-01 DIAGNOSIS — Z794 Long term (current) use of insulin: Secondary | ICD-10-CM

## 2018-09-01 DIAGNOSIS — Z7982 Long term (current) use of aspirin: Secondary | ICD-10-CM

## 2018-09-01 DIAGNOSIS — I10 Essential (primary) hypertension: Secondary | ICD-10-CM

## 2018-09-01 DIAGNOSIS — I2510 Atherosclerotic heart disease of native coronary artery without angina pectoris: Secondary | ICD-10-CM

## 2018-09-01 DIAGNOSIS — Z79899 Other long term (current) drug therapy: Secondary | ICD-10-CM | POA: Diagnosis not present

## 2018-09-01 DIAGNOSIS — I130 Hypertensive heart and chronic kidney disease with heart failure and stage 1 through stage 4 chronic kidney disease, or unspecified chronic kidney disease: Secondary | ICD-10-CM

## 2018-09-01 DIAGNOSIS — N179 Acute kidney failure, unspecified: Secondary | ICD-10-CM | POA: Diagnosis not present

## 2018-09-01 DIAGNOSIS — N183 Chronic kidney disease, stage 3 (moderate): Secondary | ICD-10-CM | POA: Diagnosis not present

## 2018-09-01 DIAGNOSIS — U071 COVID-19: Secondary | ICD-10-CM

## 2018-09-01 DIAGNOSIS — N189 Chronic kidney disease, unspecified: Secondary | ICD-10-CM

## 2018-09-01 LAB — GLUCOSE, CAPILLARY: Glucose-Capillary: 242 mg/dL — ABNORMAL HIGH (ref 70–99)

## 2018-09-01 LAB — POCT GLYCOSYLATED HEMOGLOBIN (HGB A1C): Hemoglobin A1C: 7.2 % — AB (ref 4.0–5.6)

## 2018-09-01 MED ORDER — SENNA-DOCUSATE SODIUM 8.6-50 MG PO TABS: 1.0000 | ORAL_TABLET | Freq: Every day | ORAL | 1 refills | Status: DC

## 2018-09-01 MED ORDER — CETIRIZINE HCL 10 MG PO TABS: 10.0000 mg | ORAL_TABLET | Freq: Every day | ORAL | 1 refills | Status: DC

## 2018-09-01 MED ORDER — AMLODIPINE BESYLATE 10 MG PO TABS: 10.0000 mg | ORAL_TABLET | Freq: Every day | ORAL | 0 refills | Status: DC

## 2018-09-01 MED ORDER — ATORVASTATIN CALCIUM 80 MG PO TABS: 80.0000 mg | ORAL_TABLET | Freq: Every day | ORAL | 3 refills | Status: DC

## 2018-09-01 MED ORDER — METOPROLOL SUCCINATE ER 25 MG PO TB24: 25.0000 mg | ORAL_TABLET | Freq: Every day | ORAL | 3 refills | Status: DC

## 2018-09-01 MED ORDER — PAROXETINE HCL 30 MG PO TABS: 30.0000 mg | ORAL_TABLET | Freq: Every day | ORAL | 1 refills | Status: DC

## 2018-09-01 MED ORDER — FUROSEMIDE 40 MG PO TABS: 40.0000 mg | ORAL_TABLET | Freq: Every day | ORAL | 0 refills | Status: DC

## 2018-09-01 MED ORDER — XULTOPHY 100-3.6 UNIT-MG/ML ~~LOC~~ SOPN: 10.0000 [IU] | PEN_INJECTOR | Freq: Every day | SUBCUTANEOUS | 5 refills | Status: DC

## 2018-09-01 MED ORDER — ASPIRIN 81 MG PO TBEC: DELAYED_RELEASE_TABLET | ORAL | 3 refills | Status: DC

## 2018-09-01 MED ORDER — CONTOUR NEXT TEST VI STRP: 1.0000 | ORAL_STRIP | Freq: Three times a day (TID) | 12 refills | Status: DC

## 2018-09-01 MED FILL — FUROSEMIDE 40 MG TAB: 40 | 30 days supply | Qty: 30 | Fill #0

## 2018-09-01 MED FILL — PARoxetine HCL 30 MG TABS: 30 | 30 days supply | Qty: 30 | Fill #0

## 2018-09-01 MED FILL — AMLODIPINE BESYLATE 10 MG T: 10 | 30 days supply | Qty: 30 | Fill #0

## 2018-09-01 MED FILL — XULTOPHY 100 UNIT-3.6MG/ML: 100-3.6 | 30 days supply | Qty: 3 | Fill #0

## 2018-09-01 MED FILL — METOPROLOL SUCCINATE ER 25: 25 | 30 days supply | Qty: 30 | Fill #0

## 2018-09-01 MED FILL — ATORVASTATIN 80 MG TABLET: 80 | 30 days supply | Qty: 30 | Fill #0

## 2018-09-01 NOTE — Assessment & Plan Note (Signed)
PMHx of CKD Stage 3a with baseline creatinine 1.3. On admission, creatinine 2.52 likely secondary to COVID-19.  Improved to 1.97 by discharge.   - CMP today

## 2018-09-01 NOTE — Assessment & Plan Note (Signed)
Patient has history of Type II DM. As per Dr. Zenovia Jarred last assessment, patient was on Levemir 24U qd, Novolog 10U tid, and Metforming $RemoveBefore'1000mg'lJpEMJvVxWwAt$  bid. However, patient reports that she was on Lantus 10U qd, Metformin and Levemir 24U at her assisted living facility. As per discharge papers, the patient's metformin and levemir were discontinued due to HbA1c 6.9.  Patient reports not taking any insulin since being discharged from hospital. She does not currently have a glucose meter or strips. Patient reports daily nocturia without any other urinary symptoms. POC glucose 242 and HbA1c 7.2 today.   - Patient given blood glucose meter and test strips ordered. Patient instructed to check blood glucose levels tid.  - Patient started on Xultophy 10U qD - Follow up scheduled with Dr. Lynnae January and [name] for diabetes management

## 2018-09-01 NOTE — Progress Notes (Addendum)
CC: medication refill/follow up for HTN and DM   HPI:  [name] is a 73 y.o. female with a past medical history of Type II DM requiring insulin, HFpEF, CKD stage 3a presenting to clinic today for medication refills.   Of note, patient was admitted to [facility] in May 2020 for management of COVID-19 symptoms. Patient did not require intubation during her stay and was managed with supportive care with discharge to home. Today, patient reports feeling well. She denies any fever, chills, cough, shortness of breath, chest pain, myalgias, fatigue/malaise, headaches or dizziness.   Past Medical History:  Diagnosis Date  . Adenocarcinoma of breast ([place]) 1996   Completed tamoxifen and had mastectomy.  . Aortic stenosis, severe    s/p aortic valve replacement with porcine valve 06/2004.  ECHO 2010 EF 28%, LVH, diastolic dysfxn, Bioprostetic aoritc valve, mild AS. ECHO 2013 EF 60%, Nl aortic artificial valve, dynamic obstruction in the outflow tract   Class IIb rec for annual TTE after 5 yrs. She had a TTE 2013    . Arthritis   . CAD (coronary artery disease) 2006   s/p CABG (5/06) w/ saphenous vein to RCA at time of AVR  . CKD (chronic kidney disease) stage 2, GFR 60-89 ml/min 06/03/2011   She is no longer taking NSAIDs.   [place] CVA (cerebral infarction) 2006   Post-op from AVR. Presumed embolic in nature. Carotid stenosis of R 60-79%. Repeat dopplers 4/10 no R stenosis and L stenosis of 1-29%.  . Depression    Controlled on Paxil  . Diabetes mellitus 1992   Dx 04/25/1990. Now insulin dependent, started 2008. On ACEI.   . Diverticulosis 2001  . Hyperlipidemia    Mgmt with a statin  . Hypertension    Requires 4 drug tx  . Osteoporosis 2006   DEXA 10/06 : L femur T -2.8, R -2.7. Lumbar T -2.4. On bisphosphonates and  Calcium / Vit D.  . Refusal of blood transfusions as patient is Jehovah's Witness    Review of Systems:  Review of Systems  Constitutional: Negative for chills, fever and  malaise/fatigue.  HENT: Negative for sore throat.   Respiratory: Negative for cough and shortness of breath.   Cardiovascular: Negative for chest pain and leg swelling.  Gastrointestinal: Negative for abdominal pain, constipation, diarrhea, nausea and vomiting.  Genitourinary: Negative for dysuria and urgency.       Nocturia  Musculoskeletal: Negative for joint pain and myalgias.  Neurological: Negative for dizziness, weakness and headaches.     Physical Exam:  Vitals:   09/01/18 1016  BP: (!) 154/62  Pulse: 66  Temp: 97.8 F (36.6 C)  TempSrc: Oral  SpO2: 98%  Weight: 164 lb 6.4 oz (74.6 kg)   Lifestyle  Physical activity  . Days per week: Not on file  . Minutes per session: Not on file    Physical Exam  Constitutional: She is oriented to person, place, and time and well-developed, well-nourished, and in no distress.  HENT:  Head: Normocephalic and atraumatic.  Eyes: Conjunctivae are normal.  Neck: Normal range of motion. Neck supple.  Cardiovascular: Normal rate, regular rhythm, normal heart sounds and intact distal pulses. Exam reveals no gallop and no friction rub.  No murmur heard. Pulmonary/Chest: Effort normal and breath sounds normal. No respiratory distress. She has no wheezes. She has no rales.  Abdominal: Soft. Bowel sounds are normal.  Musculoskeletal: Normal range of motion.  Neurological: She is alert and oriented to  person, place, and time.  Skin: Skin is warm and dry.  Psychiatric: Mood, memory, affect and judgment normal.     Assessment & Plan:   See Encounters Tab for problem based charting.  Patient seen with Dr. Angelia Mould

## 2018-09-01 NOTE — Assessment & Plan Note (Signed)
Patient w/hx of CAD s/p CABG in 2006. Patient is stable. She is on metoprolol $RemoveBefor'25mg'cTiHYJaJlYAp$ , aspirin $RemoveB'81mg'TLuDISCm$ , and atorvastatin $RemoveBeforeDE'80mg'cbahLoIUlOgZRgv$ .   - Continue current management - Follow up with PCP

## 2018-09-01 NOTE — Patient Instructions (Addendum)
Ms. Curling,  It was a pleasure seeing you in clinic today. We discussed your medication refills. We will send in a refill for your current medications. Please continue to take those as prescribed. We discussed optimizing your diabetes management. We will recheck your A1c levels and follow up on your insulin regimen. We will also get other blood work to assess kidney function as a follow up to your hospital visit.   - We gave you a glucose monitor and ordered testing strips. Please check your glucose levels three times a day and bring your glucose meter to your next visit.   - Please follow up with your PCP, Dr. Lynnae January, and the diabetes education specialist, [name], for diabetes management.

## 2018-09-01 NOTE — Assessment & Plan Note (Signed)
Patient with history of hypertension. BP today 154/62. As per Dr. Zenovia Jarred last assessment, patient on diltiazem 360, lisinopril 40, and metoprolol 25. However, according to the patient, she is taking Metoprolol $RemoveBeforeDEI'25mg'edxTrehtsyLUNuSi$  qd and Amlodipine $RemoveBefore'10mg'TTCZHARKTuzRr$  qd.   - Continue metoprolol and amlodipine as prescribed  - Follow up with Dr. Lynnae January for hypertension management

## 2018-09-01 NOTE — Telephone Encounter (Signed)
Thank you :)

## 2018-09-01 NOTE — Assessment & Plan Note (Addendum)
This is chronic and stable. Patient on Furosemide $RemoveBefor'40mg'gqGJZbuBmAal$  qd.

## 2018-09-02 LAB — URINALYSIS, COMPLETE
Bilirubin, UA: NEGATIVE
Ketones, UA: NEGATIVE
Leukocytes,UA: NEGATIVE
Nitrite, UA: NEGATIVE
RBC, UA: NEGATIVE
Specific Gravity, UA: 1.016 (ref 1.005–1.030)
Urobilinogen, Ur: 1 mg/dL (ref 0.2–1.0)
pH, UA: 6 (ref 5.0–7.5)

## 2018-09-02 LAB — CBC WITH DIFFERENTIAL/PLATELET
Basophils Absolute: 0 10*3/uL (ref 0.0–0.2)
Basos: 1 %
EOS (ABSOLUTE): 0.4 10*3/uL (ref 0.0–0.4)
Eos: 5 %
Hematocrit: 36.8 % (ref 34.0–46.6)
Hemoglobin: 12 g/dL (ref 11.1–15.9)
Immature Grans (Abs): 0 10*3/uL (ref 0.0–0.1)
Immature Granulocytes: 0 %
Lymphocytes Absolute: 2 10*3/uL (ref 0.7–3.1)
Lymphs: 27 %
MCH: 30.2 pg (ref 26.6–33.0)
MCHC: 32.6 g/dL (ref 31.5–35.7)
MCV: 93 fL (ref 79–97)
Monocytes Absolute: 0.6 10*3/uL (ref 0.1–0.9)
Monocytes: 8 %
Neutrophils Absolute: 4.5 10*3/uL (ref 1.4–7.0)
Neutrophils: 59 %
Platelets: 265 10*3/uL (ref 150–450)
RBC: 3.98 x10E6/uL (ref 3.77–5.28)
RDW: 13.8 % (ref 11.7–15.4)
WBC: 7.6 10*3/uL (ref 3.4–10.8)

## 2018-09-02 LAB — CMP14 + ANION GAP
ALT: 14 IU/L (ref 0–32)
AST: 16 IU/L (ref 0–40)
Albumin/Globulin Ratio: 1.2 (ref 1.2–2.2)
Albumin: 4.2 g/dL (ref 3.7–4.7)
Alkaline Phosphatase: 171 IU/L — ABNORMAL HIGH (ref 39–117)
Anion Gap: 15 mmol/L (ref 10.0–18.0)
BUN/Creatinine Ratio: 16 (ref 12–28)
BUN: 25 mg/dL (ref 8–27)
Bilirubin Total: 0.3 mg/dL (ref 0.0–1.2)
CO2: 25 mmol/L (ref 20–29)
Calcium: 9.5 mg/dL (ref 8.7–10.3)
Chloride: 98 mmol/L (ref 96–106)
Creatinine, Ser: 1.58 mg/dL — ABNORMAL HIGH (ref 0.57–1.00)
GFR calc Af Amer: 37 mL/min/{1.73_m2} — ABNORMAL LOW (ref >59)
GFR calc non Af Amer: 32 mL/min/{1.73_m2} — ABNORMAL LOW (ref >59)
Globulin, Total: 3.6 g/dL (ref 1.5–4.5)
Glucose: 253 mg/dL — ABNORMAL HIGH (ref 65–99)
Potassium: 3.7 mmol/L (ref 3.5–5.2)
Sodium: 138 mmol/L (ref 134–144)
Total Protein: 7.8 g/dL (ref 6.0–8.5)

## 2018-09-02 LAB — MICROSCOPIC EXAMINATION: Casts: NONE SEEN /lpf

## 2018-09-07 NOTE — Progress Notes (Signed)
Internal Medicine Clinic Attending  I saw and evaluated the patient.  I personally confirmed the key portions of the history and exam documented by Dr. Marva Panda and I reviewed pertinent patient test results.  The assessment, diagnosis, and plan were formulated together and I agree with the documentation in the resident's note.     CC is follow up/medication refill for HTN and DM

## 2018-09-15 ENCOUNTER — Encounter: Payer: Self-pay | Admitting: Dietician

## 2018-09-15 ENCOUNTER — Ambulatory Visit (INDEPENDENT_AMBULATORY_CARE_PROVIDER_SITE_OTHER): Payer: Medicare Other | Admitting: Internal Medicine

## 2018-09-15 ENCOUNTER — Encounter: Payer: Self-pay | Admitting: Internal Medicine

## 2018-09-15 ENCOUNTER — Other Ambulatory Visit: Payer: Self-pay

## 2018-09-15 ENCOUNTER — Telehealth: Payer: Self-pay | Admitting: *Deleted

## 2018-09-15 ENCOUNTER — Ambulatory Visit: Payer: Medicare Other | Admitting: Dietician

## 2018-09-15 VITALS — BP 163/76 | HR 69 | Temp 98.8°F | Ht 66.0 in | Wt 164.4 lb

## 2018-09-15 DIAGNOSIS — Z794 Long term (current) use of insulin: Secondary | ICD-10-CM

## 2018-09-15 DIAGNOSIS — Z79899 Other long term (current) drug therapy: Secondary | ICD-10-CM

## 2018-09-15 DIAGNOSIS — E1129 Type 2 diabetes mellitus with other diabetic kidney complication: Secondary | ICD-10-CM

## 2018-09-15 DIAGNOSIS — N182 Chronic kidney disease, stage 2 (mild): Secondary | ICD-10-CM | POA: Diagnosis not present

## 2018-09-15 DIAGNOSIS — I5030 Unspecified diastolic (congestive) heart failure: Secondary | ICD-10-CM

## 2018-09-15 DIAGNOSIS — R3915 Urgency of urination: Secondary | ICD-10-CM

## 2018-09-15 DIAGNOSIS — Z7982 Long term (current) use of aspirin: Secondary | ICD-10-CM

## 2018-09-15 DIAGNOSIS — R350 Frequency of micturition: Secondary | ICD-10-CM | POA: Diagnosis not present

## 2018-09-15 DIAGNOSIS — I2510 Atherosclerotic heart disease of native coronary artery without angina pectoris: Secondary | ICD-10-CM | POA: Diagnosis not present

## 2018-09-15 DIAGNOSIS — E1122 Type 2 diabetes mellitus with diabetic chronic kidney disease: Secondary | ICD-10-CM | POA: Diagnosis not present

## 2018-09-15 DIAGNOSIS — E785 Hyperlipidemia, unspecified: Secondary | ICD-10-CM | POA: Diagnosis not present

## 2018-09-15 DIAGNOSIS — L89159 Pressure ulcer of sacral region, unspecified stage: Secondary | ICD-10-CM | POA: Diagnosis not present

## 2018-09-15 DIAGNOSIS — Z0000 Encounter for general adult medical examination without abnormal findings: Secondary | ICD-10-CM

## 2018-09-15 DIAGNOSIS — R600 Localized edema: Secondary | ICD-10-CM

## 2018-09-15 DIAGNOSIS — Z8619 Personal history of other infectious and parasitic diseases: Secondary | ICD-10-CM

## 2018-09-15 DIAGNOSIS — I130 Hypertensive heart and chronic kidney disease with heart failure and stage 1 through stage 4 chronic kidney disease, or unspecified chronic kidney disease: Secondary | ICD-10-CM

## 2018-09-15 DIAGNOSIS — E7800 Pure hypercholesterolemia, unspecified: Secondary | ICD-10-CM

## 2018-09-15 DIAGNOSIS — Z8673 Personal history of transient ischemic attack (TIA), and cerebral infarction without residual deficits: Secondary | ICD-10-CM

## 2018-09-15 DIAGNOSIS — I10 Essential (primary) hypertension: Secondary | ICD-10-CM

## 2018-09-15 LAB — GLUCOSE, CAPILLARY: Glucose-Capillary: 287 mg/dL — ABNORMAL HIGH (ref 70–99)

## 2018-09-15 MED FILL — XULTOPHY 100 UNIT-3.6MG/ML: 100-3.6 | 21 days supply | Qty: 3 | Fill #0

## 2018-09-15 NOTE — Patient Instructions (Addendum)
[name],  I made an appointment for [place] to review a few diabetes related topics after you see the doctor on September 29, 2018.   If you have questions before then, please do not hesitate to call me.   [name] [phone]

## 2018-09-15 NOTE — Patient Instructions (Addendum)
[name],  It was a pleasure seeing you in clinic today. Today we discussed your diabetes and hypertension. We discussed the barriers to getting the medications and requirement of other equipment.    - Given your recent history of COVID19 and immobility, we are concerned about a blood clot in your left leg. Please make an appointment for ultrasound of your leg as soon as possible.   - Given that your BP is still high, we will add hydralazine to your medications.   - Please call us as soon as you know which pharmacy delivery service you would like your prescriptions sent to.   - An order has been placed for a hospital bed.   Please contact us if you have any concerns.  Thank you!

## 2018-09-15 NOTE — Assessment & Plan Note (Signed)
Patient w/Hx of HFpEF on Furosemide $RemoveBefor'40mg'dPCBEjUpNFUj$  qd reports increased edema of LLE for past few weeks with intermittent pain. She reports that she is unable to bend leg past 45 degrees.  On examination, pitting edema bilaterally to ankle. Left leg appears to be more edematous than right. No warmth, erythema or tenderness appreciated. Given recent COVID19 infection and immobilization, suspicious for DVT.   - Doppler ultrasound

## 2018-09-15 NOTE — Progress Notes (Signed)
Diabetes Self-Management Education  Visit Type: First/Initial  Appt. Start Time: 1435 Appt. End Time: 1500  09/15/2018  [name], identified by name and date of birth, is a 73 y.o. female with a diagnosis of Diabetes: Type 2.   ASSESSMENT  [name] is here with her daughter with whom she resides and is helping her with self care.   Lab Results  Component Value Date   HGBA1C 7.2 (A) 09/01/2018   HGBA1C 6.9 (H) 07/11/2018   HGBA1C 6.2 04/30/2017   HGBA1C 6.9 01/29/2017   HGBA1C 7.1 04/24/2016    Wt Readings from Last 5 Encounters:  09/15/18 164 lb 6.4 oz (74.6 kg)  09/01/18 164 lb 6.4 oz (74.6 kg)  07/19/18 149 lb 14.6 oz (68 kg)  09/05/17 178 lb (80.7 kg)  07/21/17 178 lb (80.7 kg)   Estimated body mass index is 26.53 kg/m as calculated from the following:   Height as of an earlier encounter on 09/15/18: $RemoveBef'5\' 6"'LKWClGXdDS$  (1.676 m).   Weight as of an earlier encounter on 09/15/18: 164 lb 6.4 oz (74.6 kg).  BMI is at target. Will continue to monitor.  Diabetes Self-Management Education - 09/15/18 1700      Visit Information   Visit Type  First/Initial      Initial Visit   Diabetes Type  Type 2    Are you currently following a meal plan?  Yes    What type of meal plan do you follow?  lower carb, healthy    Are you taking your medications as prescribed?  No   having trouble getting them     Health Coping   How would you rate your overall health?  Good      Psychosocial Assessment   Patient Belief/Attitude about Diabetes  Motivated to manage diabetes    Self-care barriers  Debilitated state due to current medical condition;Lack of transportation;Unsteady gait/risk for falls;Lack of material resources    Self-management support  Doctor's office;Family;CDE visits    Other persons present  Family Member    Patient Concerns  Problem Solving    Special Needs  Simplified materials    Preferred Learning Style  No preference indicated    Learning Readiness  Ready      Pre-Education Assessment   Patient understands the diabetes disease and treatment process.  Demonstrates understanding / competency    Patient understands incorporating nutritional management into lifestyle.  Demonstrates understanding / competency    Patient undertands incorporating physical activity into lifestyle.  Demonstrates understanding / competency    Patient understands using medications safely.  Needs Review    Patient understands monitoring blood glucose, interpreting and using results  Needs Review    Patient understands prevention, detection, and treatment of acute complications.  Needs Review    Patient understands prevention, detection, and treatment of chronic complications.  Demonstrates understanding / competency    Patient understands how to develop strategies to address psychosocial issues.  Demonstrates understanding / competency    Patient understands how to develop strategies to promote health/change behavior.  Needs Review      Complications   Last HgB A1C per patient/outside source  7.2 %    How often do you check your blood sugar?  1-2 times/day    Number of hypoglycemic episodes per month  0    Number of hyperglycemic episodes per week  2    Have you had a dilated eye exam in the past 12 months?  No  Patient Education   Medications  Reviewed patients medication for diabetes, action, purpose, timing of dose and side effects.    Monitoring  Taught/evaluated SMBG meter.    Personal strategies to promote health  Helped patient develop diabetes management plan for (enter comment)   selecting a pharmacy and how to transfer prescriptions     Individualized Goals (developed by patient)   Medications  take my medication as prescribed      Outcomes   Expected Outcomes  Demonstrated interest in learning. Expect positive outcomes    Future DMSE  4-6 wks    Program Status  Not Completed       Individualized Plan for Diabetes Self-Management Training:   Learning  Objective:  Patient will have a greater understanding of diabetes self-management. Patient education plan is to attend individual and/or group sessions per assessed needs and concerns.   Plan:   There are no Patient Instructions on file for this visit.  Expected Outcomes:  Demonstrated interest in learning. Expect positive outcomes Education material provided: Diabetes Resources If problems or questions, patient to contact team via:  Phone  Future DSME appointment: 4-6 wks  Debera Lat, RD 09/15/2018 5:17 PM.

## 2018-09-15 NOTE — Assessment & Plan Note (Signed)
Patient currently taking atorvastatin 80mg  qd.   - Continue current medication

## 2018-09-15 NOTE — Assessment & Plan Note (Signed)
BP 163/76 today. Patient is currently taking Metoprolol 25mg  qd and Amlodipine 10mg  qd. Patient brought her current medications with her which included an empty bottle of hydralazine 50mg . Patient does not remember when she ran out of the hydralazine. Given that BP is still elevated, will restart patient on hydralazine.   - Restart hydralazine 50mg  bid  - Continue metoprolol and amlodipine as prescribed  - Follow up in 2 wks for BP check

## 2018-09-15 NOTE — Progress Notes (Signed)
CC: follow up for hypertension and diabetes   HPI:  [name] is a 73 y.o. female w/Hx of HFpEF, CAD, HTN and type II DM requiring insulin presenting to clinic for follow up on diabetes and hypertension.  Please see problem based charting for assessment and plan of patient's current and chronic problems.   Past Medical History:  Diagnosis Date  . Adenocarcinoma of breast ([place]) 1996   Completed tamoxifen and had mastectomy.  . Aortic stenosis, severe    s/p aortic valve replacement with porcine valve 06/2004.  ECHO 2010 EF 10%, LVH, diastolic dysfxn, Bioprostetic aoritc valve, mild AS. ECHO 2013 EF 60%, Nl aortic artificial valve, dynamic obstruction in the outflow tract   Class IIb rec for annual TTE after 5 yrs. She had a TTE 2013    . Arthritis   . CAD (coronary artery disease) 2006   s/p CABG (5/06) w/ saphenous vein to RCA at time of AVR  . CKD (chronic kidney disease) stage 2, GFR 60-89 ml/min 06/03/2011   She is no longer taking NSAIDs.   Marland Kitchen CVA (cerebral infarction) 2006   Post-op from AVR. Presumed embolic in nature. Carotid stenosis of R 60-79%. Repeat dopplers 4/10 no R stenosis and L stenosis of 1-29%.  . Depression    Controlled on Paxil  . Diabetes mellitus 1992   Dx 04/25/1990. Now insulin dependent, started 2008. On ACEI.   . Diverticulosis 2001  . Hyperlipidemia    Mgmt with a statin  . Hypertension    Requires 4 drug tx  . Osteoporosis 2006   DEXA 10/06 : L femur T -2.8, R -2.7. Lumbar T -2.4. On bisphosphonates and  Calcium / Vit D.  . Refusal of blood transfusions as patient is Jehovah's Witness    Review of Systems:  Review of Systems  Constitutional: Negative for chills, fever and malaise/fatigue.  Respiratory: Negative for cough, hemoptysis, shortness of breath and wheezing.   Cardiovascular: Positive for leg swelling. Negative for chest pain and palpitations.  Gastrointestinal: Negative for abdominal pain, constipation, diarrhea, melena, nausea and  vomiting.  Genitourinary: Positive for frequency and urgency. Negative for dysuria, flank pain and hematuria.  Musculoskeletal: Negative for falls, joint pain and myalgias.  Neurological: Negative for dizziness, sensory change, focal weakness and headaches.  Endo/Heme/Allergies: Does not bruise/bleed easily.     Physical Exam:  Vitals:   09/15/18 1314  BP: (!) 163/76  Pulse: 69  Temp: 98.8 F (37.1 C)  TempSrc: Oral  SpO2: 99%  Weight: 164 lb 6.4 oz (74.6 kg)  Height: $Remove'5\' 6"'AdlkQuW$  (1.676 m)   Physical Exam  Constitutional: She is well-developed, well-nourished, and in no distress.  HENT:  Head: Normocephalic and atraumatic.  Cardiovascular: Normal rate, regular rhythm, normal heart sounds and intact distal pulses. Exam reveals no gallop and no friction rub.  No murmur heard. Pulmonary/Chest: Effort normal and breath sounds normal. No respiratory distress. She has no wheezes. She has no rales. She exhibits no tenderness.  Musculoskeletal: Normal range of motion.        General: Edema present.     Comments: Pitting edema bilaterally at the ankle Left leg appears to be more edematous than the right; no warmth, erythema or tenderness noted on examination   Skin: Skin is warm and dry. Lesion noted.     Small, shallow open ulcer with surrounding erythema  No drainage noted  No blister noted No bone, tendon or muscle exposed      Assessment & Plan:  See Encounters Tab for problem based charting.  Patient seen with Dr. Rebeca Alert

## 2018-09-15 NOTE — Telephone Encounter (Signed)
Call from [facility] - requesting VO : Stated pt has stage 2 pressure ulcer requesting vo to "apply calcium alginate and cover foam dressing" ;also SW consult and [place] aide to assist w/bathing. VO given - if no appropriate, let me know. Thanks

## 2018-09-15 NOTE — Telephone Encounter (Signed)
Agree. Thanks

## 2018-09-15 NOTE — Assessment & Plan Note (Signed)
HbA1c 7.2 at last visit. Patient was prescribed Xultophy 10U qd however she reports that she did not pick up her insulin or test strips due to financial restraints and transportation issues. Random CBG today was 287.  Patient has recently moved in with her daughter who is now her main caretaker. Given that transportation is an issue, patient would benefit from medication delivery to home. Patient reports that she was previously receiving her medications in the mail but does not recall the pharmacy she was using. Her daughter states that she is supposed to get a call from the pharmacy on Friday at 1pm and will call the office with the name of the pharmacy for future prescriptions. She was informed that she may still have to pick up the test strips from a retail pharmacy such as [facility] or [facility]. The daughter agreed.  Given financial conditions, will also consult with Dr. Maudie Mercury to assess if we can lower the financial burden of her medications  - Patient given sample test strips by [name] at this visit for glucose monitoring - Will send order for Xultophy 10U qd to pharmacy of choice on Friday  - Follow up in 2 weeks

## 2018-09-15 NOTE — Assessment & Plan Note (Signed)
Patient is mostly wheelchair and bed bound at this time, relying on her daughter for ADLs. She has home health nurse and PT. Of note, patient has developed a pressure ulcer that is shallow with surrounding edema. No drainage or blistering and no bone/tendon/muscle exposed at this time. However, given that the patient is mostly bed bound and reliant on her daughter for care, she would benefit from hospital bed.  Patient and daughter also expressed interest in a shower chair. Given that they have a tub in their apartment, discussed utilization of shower chair vs. Tub transfer bed. Patient and daughter will inform of decision at next visit.   - Order placed for hospital bed    Patient and her daughter report that transportation is an issue as they are unable to constantly take an [place] [employer] for travel. Advised patient to utilize transportation services provided by [facility] but that she would need to call 3 days in advance. Patient and daughter understood. Also, given the transportation issue, patient prefers that her medications be delivered. She has used a delivery service in the past and is still in contact with them; however, she does not know the name. She also is not aware of the prescriptions that are sent to her by that pharmacy. Patient's daughter stated that the pharmacy will call her on Friday and she will call us afterwards to let us know of the pharmacy name.   - Will follow up on Friday for pharmacy name and if none available, will utilize other pharmacies that can deliver her medications

## 2018-09-17 ENCOUNTER — Telehealth: Payer: Self-pay | Admitting: *Deleted

## 2018-09-17 NOTE — Telephone Encounter (Signed)
Pt's daughter called and states she will start using [phone]. Called pharm, they will do pt's eval mon and then fax imc the paperwork.

## 2018-09-17 NOTE — Progress Notes (Signed)
Internal Medicine Clinic Attending  I saw and evaluated the patient.  I personally confirmed the key portions of the history and exam documented by Dr. Marva Panda and I reviewed pertinent patient test results.  The assessment, diagnosis, and plan were formulated together and I agree with the documentation in the resident's note.  Moderate to high concern for DVT with unilateral swelling and calf pain, also in the context of recent COVID and being nearly bedbound. Unfortunately, [place] scheduled in 1 week. Could consider empiric anticoagulation while awaiting study, but HAS-BLED score is quite high as well at 4. She also has difficulty getting her medications and we are working on getting them delivered. Given likelihood of medication confusion and cost concerns as well as bleeding risk, will await [place] study. Currently on aspirin, would stop when starting anticoagulation.   Lenice Pressman, M.D., Ph.D.

## 2018-09-20 ENCOUNTER — Telehealth: Payer: Self-pay

## 2018-09-20 ENCOUNTER — Other Ambulatory Visit: Payer: Self-pay | Admitting: Internal Medicine

## 2018-09-20 DIAGNOSIS — IMO0002 Reserved for concepts with insufficient information to code with codable children: Secondary | ICD-10-CM

## 2018-09-20 DIAGNOSIS — I2510 Atherosclerotic heart disease of native coronary artery without angina pectoris: Secondary | ICD-10-CM

## 2018-09-20 DIAGNOSIS — I10 Essential (primary) hypertension: Secondary | ICD-10-CM

## 2018-09-20 DIAGNOSIS — E1122 Type 2 diabetes mellitus with diabetic chronic kidney disease: Secondary | ICD-10-CM

## 2018-09-20 DIAGNOSIS — E1165 Type 2 diabetes mellitus with hyperglycemia: Secondary | ICD-10-CM

## 2018-09-20 NOTE — Telephone Encounter (Signed)
[place] with [facility] requesting VO for PT. Please call back @ [phone].

## 2018-09-20 NOTE — Telephone Encounter (Signed)
OK with VO

## 2018-09-20 NOTE — Telephone Encounter (Signed)
Return call to [facility] PT, [facility] - requesting verbal order "Continue home health PT fro 2 times a week x 3 weeks the once x 1 week to reassess". She stated pt is doing well but having trouble getting out of w/c and transferring. VO given - if not appropriate, let me know. Thanks

## 2018-09-21 ENCOUNTER — Telehealth: Payer: Self-pay | Admitting: *Deleted

## 2018-09-21 NOTE — Telephone Encounter (Signed)
Following community message received yesterday from Anner Crete at [facility]:  HI - THIS PT'S BED WAS DELIVERED THIS AM AND THE GEL OVERLAY WILL BE DELIVERED TOMORROW.   [name]

## 2018-09-22 ENCOUNTER — Other Ambulatory Visit: Payer: Self-pay | Admitting: Internal Medicine

## 2018-09-22 DIAGNOSIS — Z794 Long term (current) use of insulin: Secondary | ICD-10-CM

## 2018-09-22 DIAGNOSIS — F325 Major depressive disorder, single episode, in full remission: Secondary | ICD-10-CM

## 2018-09-22 DIAGNOSIS — E1122 Type 2 diabetes mellitus with diabetic chronic kidney disease: Secondary | ICD-10-CM

## 2018-09-22 DIAGNOSIS — E1165 Type 2 diabetes mellitus with hyperglycemia: Secondary | ICD-10-CM

## 2018-09-22 DIAGNOSIS — I10 Essential (primary) hypertension: Secondary | ICD-10-CM

## 2018-09-22 DIAGNOSIS — IMO0002 Reserved for concepts with insufficient information to code with codable children: Secondary | ICD-10-CM

## 2018-09-22 DIAGNOSIS — I2510 Atherosclerotic heart disease of native coronary artery without angina pectoris: Secondary | ICD-10-CM

## 2018-09-22 DIAGNOSIS — I5032 Chronic diastolic (congestive) heart failure: Secondary | ICD-10-CM

## 2018-09-22 DIAGNOSIS — E1129 Type 2 diabetes mellitus with other diabetic kidney complication: Secondary | ICD-10-CM

## 2018-09-22 MED ORDER — METOPROLOL SUCCINATE ER 25 MG PO TB24: 25.0000 mg | ORAL_TABLET | Freq: Every day | ORAL | 3 refills | Status: DC

## 2018-09-22 MED ORDER — CETIRIZINE HCL 10 MG PO TABS: 10.0000 mg | ORAL_TABLET | Freq: Every day | ORAL | 1 refills | Status: DC

## 2018-09-22 MED ORDER — ATORVASTATIN CALCIUM 80 MG PO TABS: 80.0000 mg | ORAL_TABLET | Freq: Every day | ORAL | 3 refills | Status: DC

## 2018-09-22 MED ORDER — "PEN NEEDLES 3/16"" 31G X 5 MM MISC": 1.0000 | Freq: Four times a day (QID) | 11 refills | Status: DC

## 2018-09-22 MED ORDER — FUROSEMIDE 40 MG PO TABS: 40.0000 mg | ORAL_TABLET | Freq: Every day | ORAL | 0 refills | Status: DC

## 2018-09-22 MED ORDER — AMLODIPINE BESYLATE 10 MG PO TABS: 10.0000 mg | ORAL_TABLET | Freq: Every day | ORAL | 0 refills | Status: DC

## 2018-09-22 MED ORDER — CONTOUR NEXT TEST VI STRP: 1.0000 | ORAL_STRIP | Freq: Three times a day (TID) | 12 refills | Status: DC

## 2018-09-22 MED ORDER — SENNA-DOCUSATE SODIUM 8.6-50 MG PO TABS: 1.0000 | ORAL_TABLET | Freq: Every day | ORAL | 1 refills | Status: DC

## 2018-09-22 MED ORDER — ASPIRIN 81 MG PO TBEC: DELAYED_RELEASE_TABLET | ORAL | 3 refills | Status: DC

## 2018-09-22 MED ORDER — XULTOPHY 100-3.6 UNIT-MG/ML ~~LOC~~ SOPN: 10.0000 [IU] | PEN_INJECTOR | Freq: Every day | SUBCUTANEOUS | 5 refills | Status: DC

## 2018-09-22 MED ORDER — ACCU-CHEK FASTCLIX LANCETS MISC: 3 refills | Status: DC

## 2018-09-22 MED ORDER — PAROXETINE HCL 30 MG PO TABS: 30.0000 mg | ORAL_TABLET | Freq: Every day | ORAL | 1 refills | Status: DC

## 2018-09-22 NOTE — Progress Notes (Signed)
As requested by patient and daughter, sent the prescriptions to [facility].

## 2018-09-23 ENCOUNTER — Other Ambulatory Visit: Payer: Self-pay

## 2018-09-23 ENCOUNTER — Ambulatory Visit (HOSPITAL_COMMUNITY)
Admission: RE | Admit: 2018-09-23 | Discharge: 2018-09-23 | Disposition: A | Discharge status: Home / Self Care | Payer: Medicare Other | Source: Ambulatory Visit | Attending: Internal Medicine | Admitting: Internal Medicine

## 2018-09-23 ENCOUNTER — Telehealth: Payer: Self-pay | Admitting: *Deleted

## 2018-09-23 DIAGNOSIS — R600 Localized edema: Secondary | ICD-10-CM

## 2018-09-23 NOTE — Telephone Encounter (Signed)
Michelle with [facility] called to report patient is negative for DVT of left leg. Hubbard Hartshorn, RN, BSN

## 2018-09-23 NOTE — Progress Notes (Signed)
Left lower extremity venous duplex completed. Refer to "CV Proc" under chart review to view preliminary results.  09/23/2018 1:36 PM Maudry Mayhew, MHA, RVT, RDCS, RDMS

## 2018-09-23 NOTE — Telephone Encounter (Signed)
Noted! Thank you

## 2018-09-24 NOTE — Progress Notes (Signed)
Called the patient. Spoke to her daughter regarding the results. She reports that there is still lower extremity edema (L>R). She will follow up in clinic on 09/29/2018 for this.

## 2018-09-29 ENCOUNTER — Ambulatory Visit: Payer: Medicare Other | Admitting: Dietician

## 2018-09-29 ENCOUNTER — Other Ambulatory Visit: Payer: Self-pay

## 2018-09-29 ENCOUNTER — Encounter: Payer: Self-pay | Admitting: Internal Medicine

## 2018-09-29 ENCOUNTER — Ambulatory Visit (INDEPENDENT_AMBULATORY_CARE_PROVIDER_SITE_OTHER): Payer: Medicare Other | Admitting: Internal Medicine

## 2018-09-29 ENCOUNTER — Encounter: Payer: Self-pay | Admitting: Dietician

## 2018-09-29 VITALS — BP 143/65 | HR 75 | Temp 98.5°F | Ht 66.0 in | Wt 159.1 lb

## 2018-09-29 DIAGNOSIS — I5032 Chronic diastolic (congestive) heart failure: Secondary | ICD-10-CM

## 2018-09-29 DIAGNOSIS — E1165 Type 2 diabetes mellitus with hyperglycemia: Secondary | ICD-10-CM

## 2018-09-29 DIAGNOSIS — I421 Obstructive hypertrophic cardiomyopathy: Secondary | ICD-10-CM | POA: Diagnosis not present

## 2018-09-29 DIAGNOSIS — Z7982 Long term (current) use of aspirin: Secondary | ICD-10-CM | POA: Diagnosis not present

## 2018-09-29 DIAGNOSIS — E1129 Type 2 diabetes mellitus with other diabetic kidney complication: Secondary | ICD-10-CM

## 2018-09-29 DIAGNOSIS — IMO0002 Reserved for concepts with insufficient information to code with codable children: Secondary | ICD-10-CM

## 2018-09-29 DIAGNOSIS — I10 Essential (primary) hypertension: Secondary | ICD-10-CM

## 2018-09-29 DIAGNOSIS — K5900 Constipation, unspecified: Secondary | ICD-10-CM

## 2018-09-29 DIAGNOSIS — R600 Localized edema: Secondary | ICD-10-CM

## 2018-09-29 DIAGNOSIS — E1122 Type 2 diabetes mellitus with diabetic chronic kidney disease: Secondary | ICD-10-CM

## 2018-09-29 DIAGNOSIS — Z794 Long term (current) use of insulin: Secondary | ICD-10-CM

## 2018-09-29 MED ORDER — AMLODIPINE BESYLATE 10 MG PO TABS: 10.0000 mg | ORAL_TABLET | Freq: Every day | ORAL | 0 refills | Status: DC

## 2018-09-29 MED ORDER — HYDRALAZINE HCL 50 MG PO TABS: 50.0000 mg | ORAL_TABLET | Freq: Two times a day (BID) | ORAL | 3 refills | Status: DC

## 2018-09-29 MED ORDER — DOCUSATE SODIUM 100 MG PO CAPS: 100.0000 mg | ORAL_CAPSULE | Freq: Every day | ORAL | 1 refills | Status: DC | PRN

## 2018-09-29 MED ORDER — ACCU-CHEK FASTCLIX LANCETS MISC: 3 refills | Status: DC

## 2018-09-29 MED ORDER — CONTOUR NEXT TEST VI STRP: 1.0000 | ORAL_STRIP | Freq: Three times a day (TID) | 12 refills | Status: DC

## 2018-09-29 MED ORDER — "PEN NEEDLES 3/16"" 31G X 5 MM MISC": 1.0000 | Freq: Four times a day (QID) | 11 refills | Status: DC

## 2018-09-29 NOTE — Progress Notes (Signed)
Diabetes Self-Management Education  Visit Type: Follow-up  Appt. Start Time: 1640 Appt. End Time: 7014  09/29/2018  [name], identified by name and date of birth, is a 73 y.o. female with a diagnosis of Diabetes:  Marland Kitchen Type 2  ASSESSMENT  [name] is here with her daughter with whom she resides and is helping her with self care.  BMI is at target. Will continue to monitor.  Diabetes Self-Management Education - 09/29/18 1600      Visit Information   Visit Type  Follow-up      Health Coping   How would you rate your overall health?  Good      Complications   Fasting Blood glucose range (mg/dL)  70-129;130-179    Postprandial Blood glucose range (mg/dL)  180-200      Patient Self-Evaluation of Goals - Patient rates self as meeting previously set goals (% of time)   Medications  >75%      Outcomes   Expected Outcomes  Demonstrated interest in learning. Expect positive outcomes    Future DMSE  4-6 wks    Program Status  Completed      Subsequent Visit   Since your last visit have you continued or begun to take your medications as prescribed?  Yes    Since your last visit have you had your blood pressure checked?  Yes    Is your most recent blood pressure lower, unchanged, or higher since your last visit?  Lower    Since your last visit have you experienced any weight changes?  Loss    Weight Loss (lbs)  5    Since your last visit, are you checking your blood glucose at least once a day?  Yes     84-198 100%  Individualized Plan for Diabetes Self-Management Training:   Learning Objective:  Patient will have a greater understanding of diabetes self-management.   Patient education plan is to attend individual and/or group sessions per assessed needs and concerns.   Plan:   Patient Instructions  Dear [name],  Good to see you. I hope you do not mind if I call in a month or so to check on you.   [name] [phone]    Expected Outcomes:  Demonstrated  interest in learning. Expect positive outcomes able to check blood sugar, take diabetes medicine Education material provided: Diabetes Resources If problems or questions, patient to contact team via:  Phone  Future DSME appointment: 4-6 wks  Debera Lat, RD 09/29/2018 4:53 PM.

## 2018-09-29 NOTE — Assessment & Plan Note (Signed)
Left lower extremity edema: Ms. Algernon Huxley was evaluated at the clinic by Dr. Marva Panda on September 15, 2018 with complaints of left lower extremity edema.  Though she has a history of chronic diastolic heart failure and HOCM, there was concern for deep vein thrombosis at that visit and she was scheduled to undergo a Doppler ultrasound of the lower extremities.  Ultrasound did not reveal any evidence of deep vein thrombosis bilaterally.  Today, she reports no lower extremity edema and also denies Calf pain.  Due to the coronavirus pandemic, she has been at home and really engage in much physical activity.  She does however state that she is gone to [place] today with her granddaughter to get groceries and also just walk around.  She might have some degree of May Thurner syndrome making her left lower extremity probably to swelling.  Plan: - Advised to continue her daily activities and ambulate as much as she can - Return pracautions

## 2018-09-29 NOTE — Patient Instructions (Addendum)
Ms. Kloosterman, follow-up left lower extremity edema  It was a pleasure taking care of your the clinic today.  Overall, I am glad to hear that the swelling in your left leg has improved and also the ultrasound did not show any blood clots.  I would encourage you to continue doing your daily activities and as much walking as you can.  I have also refilled the medications and the diabetes supplies you requested.  Take Care! Dr. Eileen Stanford  Please call the internal medicine center clinic if you have any questions or concerns, we may be able to help and keep you from a long and expensive emergency room wait. Our clinic and after hours phone number is [phone], the best time to call is Monday through Friday 9 am to 4 pm but there is always someone available 24/7 if you have an emergency. If you need medication refills please notify your pharmacy one week in advance and they will send [place] a request.

## 2018-09-29 NOTE — Patient Instructions (Signed)
Dear [name],  Good to see you. I hope you do not mind if I call in a month or so to check on you.   [name] [phone]

## 2018-09-29 NOTE — Progress Notes (Signed)
   CC: Follow-up left lower extremity edema  HPI:  [name] is a 73 y.o. very pleasant African-American woman with medical problems listed below presenting to follow-up on left lower extremity edema.  She is here with her granddaughter [name] who assist her at home.  Please see problem based charting for further details.  Past Medical History:  Diagnosis Date  . Adenocarcinoma of breast ([employer]) 1996   Completed tamoxifen and had mastectomy.  . Aortic stenosis, severe    s/p aortic valve replacement with porcine valve 06/2004.  ECHO 2010 EF 14%, LVH, diastolic dysfxn, Bioprostetic aoritc valve, mild AS. ECHO 2013 EF 60%, Nl aortic artificial valve, dynamic obstruction in the outflow tract   Class IIb rec for annual TTE after 5 yrs. She had a TTE 2013    . Arthritis   . CAD (coronary artery disease) 2006   s/p CABG (5/06) w/ saphenous vein to RCA at time of AVR  . CKD (chronic kidney disease) stage 2, GFR 60-89 ml/min 06/03/2011   She is no longer taking NSAIDs.   Marland Kitchen CVA (cerebral infarction) 2006   Post-op from AVR. Presumed embolic in nature. Carotid stenosis of R 60-79%. Repeat dopplers 4/10 no R stenosis and L stenosis of 1-29%.  . Depression    Controlled on Paxil  . Diabetes mellitus 1992   Dx 04/25/1990. Now insulin dependent, started 2008. On ACEI.   . Diverticulosis 2001  . Hyperlipidemia    Mgmt with a statin  . Hypertension    Requires 4 drug tx  . Osteoporosis 2006   DEXA 10/06 : L femur T -2.8, R -2.7. Lumbar T -2.4. On bisphosphonates and  Calcium / Vit D.  . Refusal of blood transfusions as patient is Jehovah's Witness    Review of Systems: As per HPI  Physical Exam:  Vitals:   09/29/18 1438  BP: (!) 143/65  Pulse: 75  Temp: 98.5 F (36.9 C)  TempSrc: Oral  SpO2: 97%  Weight: 159 lb 1.6 oz (72.2 kg)  Height: $Remove'5\' 6"'WUaqxgq$  (1.676 m)   Physical Exam Vitals signs and nursing note reviewed.  Constitutional:      Appearance: Normal appearance.   Musculoskeletal:        General: No swelling, tenderness, deformity or signs of injury.     Right lower leg: No edema.     Left lower leg: No edema.  Skin:    General: Skin is dry.  Neurological:     Mental Status: She is alert.     Assessment & Plan:   See Encounters Tab for problem based charting.  Patient discussed with Dr. Evette Doffing

## 2018-09-30 NOTE — Progress Notes (Signed)
Internal Medicine Clinic Attending  Case discussed with Dr. Agyei at the time of the visit.  We reviewed the resident's history and exam and pertinent patient test results.  I agree with the assessment, diagnosis, and plan of care documented in the resident's note.    

## 2018-10-01 ENCOUNTER — Telehealth: Payer: Self-pay

## 2018-10-01 NOTE — Telephone Encounter (Signed)
[name] requesting refill on    Insulin Pen Needle (PEN NEEDLES 3/16") 31G X 5 MM MISC   hydrALAZINE (APRESOLINE) 50 MG tablet,  Accu-Chek FastClix Lancets MISC.

## 2018-10-01 NOTE — Telephone Encounter (Signed)
Call [facility] as [facility]-spoke with pt's dtr, but was unable to understand her due to her phone was going in and out.  CMA asked that she call back on another line to confirm pharmacy.Despina Hidden Cassady8/7/20209:49 AM

## 2018-10-04 ENCOUNTER — Telehealth: Payer: Self-pay | Admitting: *Deleted

## 2018-10-04 NOTE — Telephone Encounter (Signed)
Pt's daughter calls and wants to know what her insurance pays for drug coverage, she is advised to look on back of insurance card and call customer service, she is agreeable

## 2018-10-05 ENCOUNTER — Other Ambulatory Visit: Payer: Self-pay | Admitting: Internal Medicine

## 2018-10-05 DIAGNOSIS — E1165 Type 2 diabetes mellitus with hyperglycemia: Secondary | ICD-10-CM

## 2018-10-05 DIAGNOSIS — E1129 Type 2 diabetes mellitus with other diabetic kidney complication: Secondary | ICD-10-CM

## 2018-10-05 DIAGNOSIS — K5900 Constipation, unspecified: Secondary | ICD-10-CM

## 2018-10-05 DIAGNOSIS — IMO0002 Reserved for concepts with insufficient information to code with codable children: Secondary | ICD-10-CM

## 2018-10-05 DIAGNOSIS — Z794 Long term (current) use of insulin: Secondary | ICD-10-CM

## 2018-10-05 DIAGNOSIS — E1122 Type 2 diabetes mellitus with diabetic chronic kidney disease: Secondary | ICD-10-CM

## 2018-10-05 DIAGNOSIS — I5032 Chronic diastolic (congestive) heart failure: Secondary | ICD-10-CM

## 2018-10-05 MED ORDER — "PEN NEEDLES 3/16"" 31G X 5 MM MISC": 1.0000 | Freq: Four times a day (QID) | 1 refills | Status: DC

## 2018-10-05 MED ORDER — HYDRALAZINE HCL 50 MG PO TABS: 50.0000 mg | ORAL_TABLET | Freq: Two times a day (BID) | ORAL | 1 refills | Status: DC

## 2018-10-05 MED ORDER — CONTOUR NEXT TEST VI STRP: ORAL_STRIP | 12 refills | Status: DC

## 2018-10-05 MED ORDER — ACCU-CHEK FASTCLIX LANCETS MISC: 2 refills | Status: DC

## 2018-10-05 MED ORDER — FUROSEMIDE 40 MG PO TABS: 40.0000 mg | ORAL_TABLET | Freq: Every day | ORAL | 1 refills | Status: DC

## 2018-10-05 MED ORDER — DOCUSATE SODIUM 100 MG PO CAPS: 100.0000 mg | ORAL_CAPSULE | Freq: Every day | ORAL | 1 refills | Status: DC | PRN

## 2018-10-05 NOTE — Telephone Encounter (Signed)
Rec'd call from [facility] from [place] requests were faxed on 09/30/2018 with no response for the following medications:  hydrALAZINE (APRESOLINE) 50 MG tablet  docusate sodium (COLACE) 100 MG capsule   Pt requesting Contour Diabetic/Supplies   Insulin Degludec-Liraglutide (XULTOPHY) 100-3.6 UNIT-MG/ML SOPN  Accu-Chek FastClix Lancets MISC  Spectravite Oral Medication  Please call back   Fax  Number was provided as well for requests [phone]

## 2018-10-05 NOTE — Telephone Encounter (Signed)
Pls sch PCP appt two appt slots.

## 2018-10-11 ENCOUNTER — Telehealth: Payer: Self-pay | Admitting: *Deleted

## 2018-10-11 NOTE — Telephone Encounter (Signed)
Fall today, no injuries, per [facility], pt was not using walker at the time, she has been re-educated on using walker at all times csw for resources. VO given, do you agree? HHPT states pt's 146/72, h/a and dizzy for 3 days, tylenol given for h/a and pain is 0.

## 2018-10-11 NOTE — Telephone Encounter (Signed)
Agree. thanks

## 2018-10-14 ENCOUNTER — Telehealth: Payer: Self-pay

## 2018-10-14 NOTE — Telephone Encounter (Signed)
Returned call to Dominica Severin, [place] with [facility] who is still in the home with patient. States patient had not taken her medication prior to his arrival and BP 171/80 HR 60s. States BP was outside the parameters for him to be able to do PT. Patient has since taken meds; he will repeat BP and call back with those results. Hubbard Hartshorn, RN, BSN

## 2018-10-14 NOTE — Telephone Encounter (Signed)
Hoy Morn with [facility] requesting to speak with a nurse about pt elevated bp 171/80, will not do any PT today due to bp. Please call back.

## 2018-10-14 NOTE — Telephone Encounter (Signed)
Dominica Severin returned call. BP now 175/80 but it's been less than 30 minutes since patient took meds. He will be unable to do PT with patient today. Hubbard Hartshorn, RN, BSN

## 2018-10-15 ENCOUNTER — Telehealth: Payer: Self-pay | Admitting: *Deleted

## 2018-10-15 NOTE — Telephone Encounter (Signed)
Daughter dropped off PCS form. Patient must have been seen within 90 days of Provider signature on form. Patient was seen in [facility] on 09/15/2018 and hospital bed was ordered. Last OV with PCP April 2019. Hubbard Hartshorn, RN, BSN

## 2018-10-15 NOTE — Telephone Encounter (Signed)
Will do. Thank you

## 2018-10-20 ENCOUNTER — Telehealth: Payer: Self-pay | Admitting: *Deleted

## 2018-10-20 NOTE — Telephone Encounter (Signed)
[facility] called back. Repeat BP 190/80. They did not do any PT today 2/2 patient's elevated BP. Call placed to patient. Daughter states patient fell last week but denies injury. No [facility] appt available today. Daughter prefers afternoon appt. In-person appt given for tomorrow at 1:15. Hubbard Hartshorn, RN, BSN

## 2018-10-20 NOTE — Telephone Encounter (Signed)
Received call from Pricilla Handler, PT with [facility] reporting patient has had a H/A x 5 days; rates 7/10 at present, no other sx. BP 172/88. Patient has taken lasix, metoprolol, hydralazine aspirin and paroxetine approx 1 hour ago. States some relief from Tylenol but it takes awhile to work. Constance Haw will be with patient for next 45 minutes and will recheck BP before she leaves. Will send to PCP to advise. Hubbard Hartshorn, RN, BSN

## 2018-10-20 NOTE — Telephone Encounter (Signed)
Headache and blood pressure persist, I would recommend an appointment.  We could try a telehealth but it may need to be converted to an in person based on the conversation.  Has she had any falls?

## 2018-10-20 NOTE — Telephone Encounter (Signed)
[place] OT calls for VO 2x week for 3 weeks for recert, cont building strength Do you agree? OT also adds when he went over meds pt has not been taking her amlodipine, she does not have it in the home. This could be a contributing factor to BP elevation, will notify ACC doc's

## 2018-10-21 ENCOUNTER — Other Ambulatory Visit: Payer: Self-pay

## 2018-10-21 ENCOUNTER — Ambulatory Visit (INDEPENDENT_AMBULATORY_CARE_PROVIDER_SITE_OTHER): Payer: Medicare Other | Admitting: Internal Medicine

## 2018-10-21 ENCOUNTER — Encounter: Payer: Self-pay | Admitting: Internal Medicine

## 2018-10-21 DIAGNOSIS — Z7982 Long term (current) use of aspirin: Secondary | ICD-10-CM | POA: Diagnosis not present

## 2018-10-21 DIAGNOSIS — Z79899 Other long term (current) drug therapy: Secondary | ICD-10-CM

## 2018-10-21 DIAGNOSIS — I10 Essential (primary) hypertension: Secondary | ICD-10-CM | POA: Diagnosis not present

## 2018-10-21 DIAGNOSIS — R011 Cardiac murmur, unspecified: Secondary | ICD-10-CM

## 2018-10-21 MED ORDER — HYDRALAZINE HCL 50 MG PO TABS: 50.0000 mg | ORAL_TABLET | Freq: Three times a day (TID) | ORAL | 1 refills | Status: DC

## 2018-10-21 MED ORDER — ACETAMINOPHEN 500 MG PO TABS: 1000.0000 mg | ORAL_TABLET | Freq: Four times a day (QID) | ORAL | 2 refills | Status: DC | PRN

## 2018-10-21 MED ORDER — ACETAMINOPHEN 325 MG PO TABS: 650.0000 mg | ORAL_TABLET | Freq: Four times a day (QID) | ORAL | 0 refills | Status: DC | PRN

## 2018-10-21 MED ORDER — AMLODIPINE BESYLATE 10 MG PO TABS: 10.0000 mg | ORAL_TABLET | Freq: Every day | ORAL | 0 refills | Status: DC

## 2018-10-21 NOTE — Telephone Encounter (Signed)
Agree with HHOT

## 2018-10-21 NOTE — Patient Instructions (Signed)
[name], It was nice seeing you! Today we discussed your blood pressure which is elevated today and likely causing your headaches. I'd like you to start taking Amlodipine again and am increasing your hydralazine to three times daily. [facility] so [facility] will have it as well.   I have also sent in Tylenol for your headaches.   We will see you back in 2 weeks to see how these changes do for your blood pressure.   Take care! Dr. Koleen Distance

## 2018-10-24 ENCOUNTER — Encounter: Payer: Self-pay | Admitting: Internal Medicine

## 2018-10-24 NOTE — Progress Notes (Signed)
   CC: HTN  HPI:  [name] is a 73 y.o. female with PMHx listed below who presents for persistently elevated blood pressures and headaches for the last week. Please see problem based charting for further details.   Past Medical History:  Diagnosis Date  . Adenocarcinoma of breast ([place]) 1996   Completed tamoxifen and had mastectomy.  . Aortic stenosis, severe    s/p aortic valve replacement with porcine valve 06/2004.  ECHO 2010 EF 77%, LVH, diastolic dysfxn, Bioprostetic aoritc valve, mild AS. ECHO 2013 EF 60%, Nl aortic artificial valve, dynamic obstruction in the outflow tract   Class IIb rec for annual TTE after 5 yrs. She had a TTE 2013    . Arthritis   . CAD (coronary artery disease) 2006   s/p CABG (5/06) w/ saphenous vein to RCA at time of AVR  . CKD (chronic kidney disease) stage 2, GFR 60-89 ml/min 06/03/2011   She is no longer taking NSAIDs.   Marland Kitchen CVA (cerebral infarction) 2006   Post-op from AVR. Presumed embolic in nature. Carotid stenosis of R 60-79%. Repeat dopplers 4/10 no R stenosis and L stenosis of 1-29%.  . Depression    Controlled on Paxil  . Diabetes mellitus 1992   Dx 04/25/1990. Now insulin dependent, started 2008. On ACEI.   . Diverticulosis 2001  . Hyperlipidemia    Mgmt with a statin  . Hypertension    Requires 4 drug tx  . Osteoporosis 2006   DEXA 10/06 : L femur T -2.8, R -2.7. Lumbar T -2.4. On bisphosphonates and  Calcium / Vit D.  . Refusal of blood transfusions as patient is Jehovah's Witness    Review of Systems:   Review of Systems  Constitutional: Negative for chills and fever.  Eyes: Negative for blurred vision and double vision.  Respiratory: Negative for shortness of breath.   Cardiovascular: Negative for chest pain and palpitations.  Gastrointestinal: Negative for abdominal pain.  Neurological: Positive for headaches. Negative for dizziness, sensory change, focal weakness and loss of consciousness.    Physical Exam:  Vitals:   10/21/18 1313  BP: (!) 192/90  Pulse: 69  Temp: 98.2 F (36.8 C)  TempSrc: Oral  SpO2: 100%  Weight: 163 lb 3.2 oz (74 kg)   General: alert, well-appearing female in NAD CV: RRR; SEM at RUSB Pulm: normal work of breathing; lungs CTAB  Neuro: A&Ox3; Cranial nerves intact. Motor and sensory exam intact throughout.  Ext: no pitting edema in BLE   Assessment & Plan:   See Encounters Tab for problem based charting.  Patient discussed with Dr. Dareen Piano

## 2018-10-24 NOTE — Assessment & Plan Note (Signed)
Patient presents for persistently elevated blood pressures. Does endorse associated headaches that are relieved with Tylenol. There are no concerning neurologic findings on physical exam. Per medication review, she is supposed to be on Amlodipine 10 mg, but family notes she has not been on this for an unknown amount of time. Will send in new Rx for Amlodipine 10 mg and increase Hydral 50 mg BID to TID.  Follow-up in 2 weeks for blood pressure re-check.

## 2018-10-24 NOTE — Progress Notes (Signed)
Internal Medicine Clinic Attending  Case discussed with Dr. Bloomfield at the time of the visit.  We reviewed the resident's history and exam and pertinent patient test results.  I agree with the assessment, diagnosis, and plan of care documented in the resident's note.  

## 2018-11-04 ENCOUNTER — Ambulatory Visit: Payer: Medicare Other

## 2018-11-04 ENCOUNTER — Telehealth: Payer: Self-pay | Admitting: Internal Medicine

## 2018-11-04 NOTE — Telephone Encounter (Signed)
Pt missed called, pls contact [phone]

## 2018-11-04 NOTE — Telephone Encounter (Signed)
Rtc, no answer, cannot find where anyone else has called her

## 2018-11-11 ENCOUNTER — Telehealth: Payer: Self-pay | Admitting: Internal Medicine

## 2018-11-11 NOTE — Telephone Encounter (Signed)
[facility] calling to extended her OT for 2 week 2.

## 2018-11-11 NOTE — Telephone Encounter (Signed)
Form corrections received today. Completed and signed Request for Independent Assessment for [place] [facility] Need faxed to [facility] at [phone].  Mayana Irigoyen L Ducatte9/17/202010:34 AM

## 2018-11-11 NOTE — Telephone Encounter (Signed)
rtc to will  VO for 2x week for 2 weeks to continue strength building and increase ability for adl's Do you agree?

## 2018-11-12 NOTE — Telephone Encounter (Signed)
Agree 

## 2018-11-16 ENCOUNTER — Other Ambulatory Visit: Payer: Self-pay | Admitting: Internal Medicine

## 2018-11-16 DIAGNOSIS — F325 Major depressive disorder, single episode, in full remission: Secondary | ICD-10-CM

## 2018-12-16 ENCOUNTER — Encounter: Payer: Medicare Other | Admitting: Internal Medicine

## 2018-12-16 ENCOUNTER — Encounter: Payer: Self-pay | Admitting: Internal Medicine

## 2018-12-20 ENCOUNTER — Telehealth: Payer: Self-pay | Admitting: Dietician

## 2018-12-20 NOTE — Telephone Encounter (Signed)
Called to follow up on her new diabetes medicine- xultophy "10 units, I love it, where has it been all my life". Her blood sugar was 113 this am,appetite good,weight at lower end, but she feels it's okay. her strength is improving, walks with walker around the house. Call transferred to make an appointment with Dr. Lynnae January per her request.

## 2019-01-26 ENCOUNTER — Other Ambulatory Visit: Payer: Self-pay | Admitting: *Deleted

## 2019-01-26 DIAGNOSIS — I10 Essential (primary) hypertension: Secondary | ICD-10-CM

## 2019-01-26 MED ORDER — METOPROLOL SUCCINATE ER 25 MG PO TB24: 25.0000 mg | ORAL_TABLET | Freq: Every day | ORAL | 1 refills | Status: DC

## 2019-01-26 NOTE — Telephone Encounter (Signed)
[facility] requesting 90 day supply of metoprolol as this will be less expensive for patient

## 2019-02-07 ENCOUNTER — Emergency Department (HOSPITAL_COMMUNITY): Payer: Medicare Other

## 2019-02-07 ENCOUNTER — Inpatient Hospital Stay (HOSPITAL_COMMUNITY)
Admission: EM | Admit: 2019-02-07 | Discharge: 2019-02-14 | DRG: 242 | Disposition: A | Discharge status: Skilled Nursing Facility | Payer: Medicare Other | Attending: Internal Medicine | Admitting: Internal Medicine

## 2019-02-07 DIAGNOSIS — I350 Nonrheumatic aortic (valve) stenosis: Secondary | ICD-10-CM | POA: Diagnosis not present

## 2019-02-07 DIAGNOSIS — Z841 Family history of disorders of kidney and ureter: Secondary | ICD-10-CM

## 2019-02-07 DIAGNOSIS — Y831 Surgical operation with implant of artificial internal device as the cause of abnormal reaction of the patient, or of later complication, without mention of misadventure at the time of the procedure: Secondary | ICD-10-CM | POA: Diagnosis present

## 2019-02-07 DIAGNOSIS — R011 Cardiac murmur, unspecified: Secondary | ICD-10-CM | POA: Diagnosis not present

## 2019-02-07 DIAGNOSIS — I2510 Atherosclerotic heart disease of native coronary artery without angina pectoris: Secondary | ICD-10-CM | POA: Diagnosis present

## 2019-02-07 DIAGNOSIS — Z820 Family history of epilepsy and other diseases of the nervous system: Secondary | ICD-10-CM

## 2019-02-07 DIAGNOSIS — I442 Atrioventricular block, complete: Secondary | ICD-10-CM | POA: Diagnosis present

## 2019-02-07 DIAGNOSIS — M810 Age-related osteoporosis without current pathological fracture: Secondary | ICD-10-CM | POA: Diagnosis present

## 2019-02-07 DIAGNOSIS — Z951 Presence of aortocoronary bypass graft: Secondary | ICD-10-CM | POA: Diagnosis not present

## 2019-02-07 DIAGNOSIS — Z9049 Acquired absence of other specified parts of digestive tract: Secondary | ICD-10-CM | POA: Diagnosis not present

## 2019-02-07 DIAGNOSIS — Z90710 Acquired absence of both cervix and uterus: Secondary | ICD-10-CM | POA: Diagnosis not present

## 2019-02-07 DIAGNOSIS — I130 Hypertensive heart and chronic kidney disease with heart failure and stage 1 through stage 4 chronic kidney disease, or unspecified chronic kidney disease: Secondary | ICD-10-CM | POA: Diagnosis present

## 2019-02-07 DIAGNOSIS — Z794 Long term (current) use of insulin: Secondary | ICD-10-CM

## 2019-02-07 DIAGNOSIS — I5030 Unspecified diastolic (congestive) heart failure: Secondary | ICD-10-CM | POA: Diagnosis not present

## 2019-02-07 DIAGNOSIS — Z01810 Encounter for preprocedural cardiovascular examination: Secondary | ICD-10-CM | POA: Diagnosis not present

## 2019-02-07 DIAGNOSIS — E1122 Type 2 diabetes mellitus with diabetic chronic kidney disease: Secondary | ICD-10-CM | POA: Diagnosis present

## 2019-02-07 DIAGNOSIS — I447 Left bundle-branch block, unspecified: Secondary | ICD-10-CM | POA: Diagnosis present

## 2019-02-07 DIAGNOSIS — T82857A Stenosis of cardiac prosthetic devices, implants and grafts, initial encounter: Secondary | ICD-10-CM | POA: Diagnosis present

## 2019-02-07 DIAGNOSIS — Z9012 Acquired absence of left breast and nipple: Secondary | ICD-10-CM

## 2019-02-07 DIAGNOSIS — Z818 Family history of other mental and behavioral disorders: Secondary | ICD-10-CM

## 2019-02-07 DIAGNOSIS — E119 Type 2 diabetes mellitus without complications: Secondary | ICD-10-CM

## 2019-02-07 DIAGNOSIS — E876 Hypokalemia: Secondary | ICD-10-CM | POA: Diagnosis present

## 2019-02-07 DIAGNOSIS — Z8673 Personal history of transient ischemic attack (TIA), and cerebral infarction without residual deficits: Secondary | ICD-10-CM | POA: Diagnosis not present

## 2019-02-07 DIAGNOSIS — Z20828 Contact with and (suspected) exposure to other viral communicable diseases: Secondary | ICD-10-CM | POA: Diagnosis present

## 2019-02-07 DIAGNOSIS — I10 Essential (primary) hypertension: Secondary | ICD-10-CM | POA: Diagnosis not present

## 2019-02-07 DIAGNOSIS — J9811 Atelectasis: Secondary | ICD-10-CM | POA: Diagnosis present

## 2019-02-07 DIAGNOSIS — I2582 Chronic total occlusion of coronary artery: Secondary | ICD-10-CM | POA: Diagnosis present

## 2019-02-07 DIAGNOSIS — N1832 Chronic kidney disease, stage 3b: Secondary | ICD-10-CM | POA: Diagnosis present

## 2019-02-07 DIAGNOSIS — Z8249 Family history of ischemic heart disease and other diseases of the circulatory system: Secondary | ICD-10-CM | POA: Diagnosis not present

## 2019-02-07 DIAGNOSIS — Z79899 Other long term (current) drug therapy: Secondary | ICD-10-CM

## 2019-02-07 DIAGNOSIS — Z23 Encounter for immunization: Secondary | ICD-10-CM

## 2019-02-07 DIAGNOSIS — K5900 Constipation, unspecified: Secondary | ICD-10-CM | POA: Diagnosis present

## 2019-02-07 DIAGNOSIS — E785 Hyperlipidemia, unspecified: Secondary | ICD-10-CM | POA: Diagnosis present

## 2019-02-07 DIAGNOSIS — R0902 Hypoxemia: Secondary | ICD-10-CM | POA: Diagnosis present

## 2019-02-07 DIAGNOSIS — E7800 Pure hypercholesterolemia, unspecified: Secondary | ICD-10-CM | POA: Diagnosis not present

## 2019-02-07 DIAGNOSIS — Z952 Presence of prosthetic heart valve: Secondary | ICD-10-CM | POA: Diagnosis not present

## 2019-02-07 DIAGNOSIS — F419 Anxiety disorder, unspecified: Secondary | ICD-10-CM | POA: Diagnosis present

## 2019-02-07 DIAGNOSIS — E1165 Type 2 diabetes mellitus with hyperglycemia: Secondary | ICD-10-CM | POA: Diagnosis not present

## 2019-02-07 DIAGNOSIS — Z8619 Personal history of other infectious and parasitic diseases: Secondary | ICD-10-CM | POA: Diagnosis not present

## 2019-02-07 DIAGNOSIS — Z853 Personal history of malignant neoplasm of breast: Secondary | ICD-10-CM

## 2019-02-07 DIAGNOSIS — I080 Rheumatic disorders of both mitral and aortic valves: Secondary | ICD-10-CM | POA: Diagnosis not present

## 2019-02-07 DIAGNOSIS — I5032 Chronic diastolic (congestive) heart failure: Secondary | ICD-10-CM | POA: Diagnosis present

## 2019-02-07 DIAGNOSIS — F329 Major depressive disorder, single episode, unspecified: Secondary | ICD-10-CM | POA: Diagnosis present

## 2019-02-07 DIAGNOSIS — Z833 Family history of diabetes mellitus: Secondary | ICD-10-CM | POA: Diagnosis not present

## 2019-02-07 DIAGNOSIS — Z95818 Presence of other cardiac implants and grafts: Secondary | ICD-10-CM

## 2019-02-07 DIAGNOSIS — N182 Chronic kidney disease, stage 2 (mild): Secondary | ICD-10-CM | POA: Diagnosis not present

## 2019-02-07 DIAGNOSIS — I5033 Acute on chronic diastolic (congestive) heart failure: Secondary | ICD-10-CM | POA: Diagnosis not present

## 2019-02-07 DIAGNOSIS — I509 Heart failure, unspecified: Secondary | ICD-10-CM

## 2019-02-07 DIAGNOSIS — I351 Nonrheumatic aortic (valve) insufficiency: Secondary | ICD-10-CM | POA: Diagnosis not present

## 2019-02-07 DIAGNOSIS — I2722 Pulmonary hypertension due to left heart disease: Secondary | ICD-10-CM | POA: Diagnosis present

## 2019-02-07 DIAGNOSIS — I088 Other rheumatic multiple valve diseases: Secondary | ICD-10-CM | POA: Diagnosis present

## 2019-02-07 DIAGNOSIS — R001 Bradycardia, unspecified: Secondary | ICD-10-CM | POA: Diagnosis present

## 2019-02-07 DIAGNOSIS — N183 Chronic kidney disease, stage 3 unspecified: Secondary | ICD-10-CM | POA: Diagnosis not present

## 2019-02-07 DIAGNOSIS — Z950 Presence of cardiac pacemaker: Secondary | ICD-10-CM | POA: Diagnosis not present

## 2019-02-07 DIAGNOSIS — Z7982 Long term (current) use of aspirin: Secondary | ICD-10-CM

## 2019-02-07 LAB — COMPREHENSIVE METABOLIC PANEL
ALT: 12 U/L (ref 0–44)
AST: 13 U/L — ABNORMAL LOW (ref 15–41)
Albumin: 3.1 g/dL — ABNORMAL LOW (ref 3.5–5.0)
Alkaline Phosphatase: 96 U/L (ref 38–126)
Anion gap: 10 (ref 5–15)
BUN: 25 mg/dL — ABNORMAL HIGH (ref 8–23)
CO2: 23 mmol/L (ref 22–32)
Calcium: 8.2 mg/dL — ABNORMAL LOW (ref 8.9–10.3)
Chloride: 110 mmol/L (ref 98–111)
Creatinine, Ser: 1.48 mg/dL — ABNORMAL HIGH (ref 0.44–1.00)
GFR calc Af Amer: 40 mL/min — ABNORMAL LOW (ref >60)
GFR calc non Af Amer: 34 mL/min — ABNORMAL LOW (ref >60)
Glucose, Bld: 203 mg/dL — ABNORMAL HIGH (ref 70–99)
Potassium: 3.3 mmol/L — ABNORMAL LOW (ref 3.5–5.1)
Sodium: 143 mmol/L (ref 135–145)
Total Bilirubin: 1 mg/dL (ref 0.3–1.2)
Total Protein: 6.5 g/dL (ref 6.5–8.1)

## 2019-02-07 LAB — URINALYSIS, ROUTINE W REFLEX MICROSCOPIC
Bilirubin Urine: NEGATIVE
Glucose, UA: NEGATIVE mg/dL
Hgb urine dipstick: NEGATIVE
Ketones, ur: NEGATIVE mg/dL
Leukocytes,Ua: NEGATIVE
Nitrite: NEGATIVE
Protein, ur: 100 mg/dL — AB
Specific Gravity, Urine: 1.012 (ref 1.005–1.030)
pH: 5 (ref 5.0–8.0)

## 2019-02-07 LAB — CBC WITH DIFFERENTIAL/PLATELET
Abs Immature Granulocytes: 0.04 10*3/uL (ref 0.00–0.07)
Basophils Absolute: 0 10*3/uL (ref 0.0–0.1)
Basophils Relative: 0 %
Eosinophils Absolute: 0.2 10*3/uL (ref 0.0–0.5)
Eosinophils Relative: 2 %
HCT: 34.4 % — ABNORMAL LOW (ref 36.0–46.0)
Hemoglobin: 11 g/dL — ABNORMAL LOW (ref 12.0–15.0)
Immature Granulocytes: 0 %
Lymphocytes Relative: 13 %
Lymphs Abs: 1.6 10*3/uL (ref 0.7–4.0)
MCH: 30.6 pg (ref 26.0–34.0)
MCHC: 32 g/dL (ref 30.0–36.0)
MCV: 95.6 fL (ref 80.0–100.0)
Monocytes Absolute: 0.7 10*3/uL (ref 0.1–1.0)
Monocytes Relative: 6 %
Neutro Abs: 9.2 10*3/uL — ABNORMAL HIGH (ref 1.7–7.7)
Neutrophils Relative %: 79 %
Platelets: 207 10*3/uL (ref 150–400)
RBC: 3.6 MIL/uL — ABNORMAL LOW (ref 3.87–5.11)
RDW: 14 % (ref 11.5–15.5)
WBC: 11.7 10*3/uL — ABNORMAL HIGH (ref 4.0–10.5)
nRBC: 0 % (ref 0.0–0.2)

## 2019-02-07 LAB — LIPID PANEL
Cholesterol: 99 mg/dL (ref 0–200)
HDL: 26 mg/dL — ABNORMAL LOW (ref >40)
LDL Cholesterol: 50 mg/dL (ref 0–99)
Total CHOL/HDL Ratio: 3.8 RATIO
Triglycerides: 117 mg/dL (ref <150)
VLDL: 23 mg/dL (ref 0–40)

## 2019-02-07 LAB — I-STAT CHEM 8, ED
BUN: 34 mg/dL — ABNORMAL HIGH (ref 8–23)
Calcium, Ion: 1.04 mmol/L — ABNORMAL LOW (ref 1.15–1.40)
Chloride: 108 mmol/L (ref 98–111)
Creatinine, Ser: 1.5 mg/dL — ABNORMAL HIGH (ref 0.44–1.00)
Glucose, Bld: 187 mg/dL — ABNORMAL HIGH (ref 70–99)
HCT: 32 % — ABNORMAL LOW (ref 36.0–46.0)
Hemoglobin: 10.9 g/dL — ABNORMAL LOW (ref 12.0–15.0)
Potassium: 4.4 mmol/L (ref 3.5–5.1)
Sodium: 142 mmol/L (ref 135–145)
TCO2: 27 mmol/L (ref 22–32)

## 2019-02-07 LAB — MAGNESIUM: Magnesium: 1.7 mg/dL (ref 1.7–2.4)

## 2019-02-07 LAB — RESPIRATORY PANEL BY RT PCR (FLU A&B, COVID)
Influenza A by PCR: NEGATIVE
Influenza B by PCR: NEGATIVE
SARS Coronavirus 2 by RT PCR: NEGATIVE

## 2019-02-07 LAB — TROPONIN I (HIGH SENSITIVITY)
Troponin I (High Sensitivity): 24 ng/L — ABNORMAL HIGH (ref <18)
Troponin I (High Sensitivity): 26 ng/L — ABNORMAL HIGH (ref <18)

## 2019-02-07 LAB — HEMOGLOBIN A1C
Hgb A1c MFr Bld: 6.8 % — ABNORMAL HIGH (ref 4.8–5.6)
Mean Plasma Glucose: 148.46 mg/dL

## 2019-02-07 LAB — LACTIC ACID, PLASMA: Lactic Acid, Venous: 0.9 mmol/L (ref 0.5–1.9)

## 2019-02-07 LAB — BRAIN NATRIURETIC PEPTIDE: B Natriuretic Peptide: 2131.7 pg/mL — ABNORMAL HIGH (ref 0.0–100.0)

## 2019-02-07 LAB — TSH: TSH: 1.63 u[IU]/mL (ref 0.350–4.500)

## 2019-02-07 LAB — LIPASE, BLOOD: Lipase: 17 U/L (ref 11–51)

## 2019-02-07 LAB — PHOSPHORUS: Phosphorus: 3.6 mg/dL (ref 2.5–4.6)

## 2019-02-07 MED ORDER — FUROSEMIDE 10 MG/ML IJ SOLN
40.0000 mg | Freq: Two times a day (BID) | INTRAMUSCULAR | Status: DC
  Administered 2019-02-08 (×3): 40 mg via INTRAVENOUS
  Filled 2019-02-07 (×3): qty 4

## 2019-02-07 MED ORDER — POTASSIUM CHLORIDE 20 MEQ PO PACK
40.0000 meq | PACK | Freq: Once | ORAL | Status: DC
  Filled 2019-02-07: qty 2

## 2019-02-07 MED ORDER — MAGNESIUM SULFATE 2 GM/50ML IV SOLN
2.0000 g | Freq: Once | INTRAVENOUS | Status: AC
  Administered 2019-02-07: 2 g via INTRAVENOUS
  Filled 2019-02-07: qty 50

## 2019-02-07 MED ORDER — NITROGLYCERIN 2 % TD OINT
1.0000 [in_us] | TOPICAL_OINTMENT | Freq: Four times a day (QID) | TRANSDERMAL | Status: DC
  Administered 2019-02-07 – 2019-02-08 (×4): 1 [in_us] via TOPICAL
  Filled 2019-02-07: qty 1
  Filled 2019-02-07: qty 30
  Filled 2019-02-07: qty 1

## 2019-02-07 MED ORDER — ASPIRIN 81 MG PO CHEW
324.0000 mg | CHEWABLE_TABLET | Freq: Once | ORAL | Status: AC
  Administered 2019-02-07: 324 mg via ORAL
  Filled 2019-02-07: qty 4

## 2019-02-07 MED ORDER — FUROSEMIDE 10 MG/ML IJ SOLN
40.0000 mg | Freq: Once | INTRAMUSCULAR | Status: AC
  Administered 2019-02-07: 40 mg via INTRAVENOUS
  Filled 2019-02-07: qty 4

## 2019-02-07 MED ORDER — SODIUM CHLORIDE 0.9% FLUSH
3.0000 mL | Freq: Two times a day (BID) | INTRAVENOUS | Status: DC
  Administered 2019-02-07 – 2019-02-08 (×3): 3 mL via INTRAVENOUS

## 2019-02-07 MED ORDER — HYDRALAZINE HCL 20 MG/ML IJ SOLN
10.0000 mg | Freq: Four times a day (QID) | INTRAMUSCULAR | Status: DC | PRN
  Administered 2019-02-08: 10 mg via INTRAVENOUS
  Filled 2019-02-07: qty 1

## 2019-02-07 MED ORDER — POTASSIUM CHLORIDE CRYS ER 20 MEQ PO TBCR
40.0000 meq | EXTENDED_RELEASE_TABLET | Freq: Once | ORAL | Status: AC
  Administered 2019-02-07: 40 meq via ORAL
  Filled 2019-02-07: qty 2

## 2019-02-07 MED ORDER — SODIUM CHLORIDE 0.9% FLUSH: 3.0000 mL | INTRAVENOUS | Status: DC | PRN

## 2019-02-07 MED ORDER — SODIUM CHLORIDE 0.9 % IV SOLN: 250.0000 mL | INTRAVENOUS | Status: DC | PRN

## 2019-02-07 MED ORDER — ONDANSETRON HCL 4 MG/2ML IJ SOLN: 4.0000 mg | Freq: Four times a day (QID) | INTRAMUSCULAR | Status: DC | PRN

## 2019-02-07 MED ORDER — INSULIN ASPART 100 UNIT/ML ~~LOC~~ SOLN
0.0000 [IU] | Freq: Every day | SUBCUTANEOUS | Status: DC
  Administered 2019-02-09 – 2019-02-11 (×3): 2 [IU] via SUBCUTANEOUS
  Administered 2019-02-13: 4 [IU] via SUBCUTANEOUS

## 2019-02-07 MED ORDER — ACETAMINOPHEN 325 MG PO TABS
650.0000 mg | ORAL_TABLET | ORAL | Status: DC | PRN
  Administered 2019-02-08 – 2019-02-09 (×2): 650 mg via ORAL
  Filled 2019-02-07 (×3): qty 2

## 2019-02-07 MED ORDER — ENOXAPARIN SODIUM 40 MG/0.4ML ~~LOC~~ SOLN
40.0000 mg | SUBCUTANEOUS | Status: DC
  Administered 2019-02-08: 40 mg via SUBCUTANEOUS
  Filled 2019-02-07: qty 0.4

## 2019-02-07 MED ORDER — INSULIN ASPART 100 UNIT/ML ~~LOC~~ SOLN
0.0000 [IU] | Freq: Three times a day (TID) | SUBCUTANEOUS | Status: DC
  Administered 2019-02-08: 3 [IU] via SUBCUTANEOUS
  Administered 2019-02-08 – 2019-02-09 (×5): 2 [IU] via SUBCUTANEOUS
  Administered 2019-02-10: 5 [IU] via SUBCUTANEOUS
  Administered 2019-02-10: 3 [IU] via SUBCUTANEOUS
  Administered 2019-02-10: 2 [IU] via SUBCUTANEOUS
  Administered 2019-02-11: 3 [IU] via SUBCUTANEOUS
  Administered 2019-02-11: 2 [IU] via SUBCUTANEOUS
  Administered 2019-02-11: 3 [IU] via SUBCUTANEOUS
  Administered 2019-02-12: 2 [IU] via SUBCUTANEOUS
  Administered 2019-02-12: 8 [IU] via SUBCUTANEOUS
  Administered 2019-02-12 – 2019-02-13 (×3): 3 [IU] via SUBCUTANEOUS
  Administered 2019-02-13 – 2019-02-14 (×2): 5 [IU] via SUBCUTANEOUS

## 2019-02-07 NOTE — ED Provider Notes (Addendum)
[facility] EMERGENCY DEPARTMENT Provider Note   CSN: [id number] Arrival date & time: 02/07/19  1306     History Chief Complaint  Patient presents with  . Shortness of Breath    [name] is a 73 y.o. female.  HPI Patient reports she has become increasingly short of breath over the past 4 days.  She describes it as being somewhat gradual in onset.  She does not directly endorse chest pain.  She reports she cannot really describe it, predominantly she just feels very short of breath.  She reports she has been taking her medications as prescribed.  I do note that her legs are swollen.  She does not think that that is excessively swollen from her baseline.  She denies fever or cough.  Denies general constitutional symptoms of body aches or general weakness.    Past Medical History:  Diagnosis Date  . Adenocarcinoma of breast ([place]) 1996   Completed tamoxifen and had mastectomy.  . Aortic stenosis, severe    s/p aortic valve replacement with porcine valve 06/2004.  ECHO 2010 EF 71%, LVH, diastolic dysfxn, Bioprostetic aoritc valve, mild AS. ECHO 2013 EF 60%, Nl aortic artificial valve, dynamic obstruction in the outflow tract   Class IIb rec for annual TTE after 5 yrs. She had a TTE 2013    . Arthritis   . CAD (coronary artery disease) 2006   s/p CABG (5/06) w/ saphenous vein to RCA at time of AVR  . CKD (chronic kidney disease) stage 2, GFR 60-89 ml/min 06/03/2011   She is no longer taking NSAIDs.   Marland Kitchen CVA (cerebral infarction) 2006   Post-op from AVR. Presumed embolic in nature. Carotid stenosis of R 60-79%. Repeat dopplers 4/10 no R stenosis and L stenosis of 1-29%.  . Depression    Controlled on Paxil  . Diabetes mellitus 1992   Dx 04/25/1990. Now insulin dependent, started 2008. On ACEI.   . Diverticulosis 2001  . Hyperlipidemia    Mgmt with a statin  . Hypertension    Requires 4 drug tx  . Osteoporosis 2006   DEXA 10/06 : L femur T -2.8, R -2.7. Lumbar  T -2.4. On bisphosphonates and  Calcium / Vit D.  . Refusal of blood transfusions as patient is Jehovah's Witness     Patient Active Problem List   Diagnosis Date Noted  . Chronic diastolic CHF (congestive heart failure) ([place]) 06/22/2017  . Peripheral arterial disease ([place]) 06/18/2017  . B12 deficiency 05/01/2017  . Edema of left lower extremity 11/08/2015  . Aortic atherosclerosis ([place]) 06/07/2014  . Abnormality of gait 05/29/2012  . HOCM (hypertrophic obstructive cardiomyopathy) ([place]) 06/11/2011  . Acute on chronic renal failure ([place]) 05/25/2011  . Routine health maintenance 06/10/2010  . Hyperlipidemia   . Refusal of blood transfusions as patient is Jehovah's Witness   . History of adenocarcinoma of breast   . Osteoporosis   . Status post CVA   . Aortic stenosis s/p Tissure AVR 2006   . CAD (coronary artery disease)   . Hypertension   . Depression 01/23/2006  . Diabetes mellitus with renal manifestations, controlled ([place]) 03/25/1990    Past Surgical History:  Procedure Laterality Date  . ABDOMINAL HYSTERECTOMY  1987   for fibroids  . AORTIC VALVE REPLACEMENT  2006  . CHOLECYSTECTOMY    . CORONARY ARTERY BYPASS GRAFT  2006   Saphenous vein to RCA at time of AVR. Course complicated by acute respiratory failure, post-op PTX, ARI,  ileus, CVA  . MASTECTOMY Left 1995   L for adenocarcinoma     OB History   No obstetric history on file.     Family History  Problem Relation Age of Onset  . Diabetes Mother   . Hypertension Mother   . Alzheimer's disease Mother   . Heart disease Father 77       AMI at age 32 and 52  . Mental illness Sister   . Heart disease Sister 18       AMI  . Kidney disease Sister     Social History   Tobacco Use  . Smoking status: Never Smoker  . Smokeless tobacco: Never Used  Substance Use Topics  . Alcohol use: Yes    Alcohol/week: 0.0 standard drinks    Comment: Occasional beer, monthly  . Drug use: No    Home Medications Prior  to Admission medications   Medication Sig Start Date End Date Taking? Authorizing Provider  Accu-Chek FastClix Lancets MISC Use to check blood sugar 4 times a day. diag code E11.22. insulin dependent 10/05/18   [name], MD  acetaminophen (TYLENOL) 500 MG tablet Take 2 tablets (1,000 mg total) by mouth every 6 (six) hours as needed. 10/21/18 10/21/19  Bloomfield, Carley D, DO  amLODipine (NORVASC) 10 MG tablet Take 1 tablet (10 mg total) by mouth daily. 10/21/18   Bloomfield, Carley D, DO  aspirin 81 MG EC tablet TAKE 1 TABLET (81 MG TOTAL) BY MOUTH DAILY. SWALLOW WHOLE. 09/25/18   Harvie Heck, MD  atorvastatin (LIPITOR) 80 MG tablet Take 1 tablet (80 mg total) by mouth at bedtime. 09/25/18   Harvie Heck, MD  cetirizine (ZYRTEC) 10 MG tablet Take 1 tablet (10 mg total) by mouth daily. 09/25/18   Harvie Heck, MD  docusate sodium (COLACE) 100 MG capsule Take 1 capsule (100 mg total) by mouth daily as needed. 10/05/18 10/05/19  [name], MD  Emollient (EUCERIN) lotion Apply topically 2 (two) times daily.    [provider]  furosemide (LASIX) 40 MG tablet Take 1 tablet (40 mg total) by mouth daily. 10/05/18   [name], MD  glucose blood (CONTOUR NEXT TEST) test strip Use to check blood sugar 4 times daily. diag code E11.22. insulin dependent 10/05/18   [name], MD  hydrALAZINE (APRESOLINE) 50 MG tablet Take 1 tablet (50 mg total) by mouth 3 (three) times daily. 10/21/18   Bloomfield, Carley D, DO  Insulin Degludec-Liraglutide (XULTOPHY) 100-3.6 UNIT-MG/ML SOPN Inject 10 Units into the skin daily. 09/25/18   Harvie Heck, MD  Insulin Pen Needle (PEN NEEDLES 3/16") 31G X 5 MM MISC 1 each by Does not apply route 4 (four) times daily. Use to check blood sugars 4 times a day. Dx code E11.22 10/05/18   [name], MD  metoprolol succinate (TOPROL-XL) 25 MG 24 hr tablet Take 1 tablet (25 mg total) by mouth daily. 01/26/19   [name], MD   PARoxetine (PAXIL) 30 MG tablet TAKE 1 TABLET BY MOUTH DAILY 11/17/18   [name], MD  polyethylene glycol [facility] / GLYCOLAX) packet Take 17 g by mouth daily. Patient taking differently: Take 17 g by mouth daily as needed for mild constipation.  06/18/17   [name], MD  sennosides-docusate sodium (SENOKOT-S) 8.6-50 MG tablet Take 1 tablet by mouth at bedtime. 09/25/18   Harvie Heck, MD  tetrahydrozoline 0.05 % ophthalmic solution Place 1 drop into both eyes as needed (dry  eyes).    [provider]    Allergies    Other  Review of Systems   Review of Systems 10 Systems reviewed and are negative for acute change except as noted in the HPI.  Physical Exam Updated Vital Signs BP (!) 160/57   Pulse (!) 48   Temp 99.4 F (37.4 C) (Oral)   Resp (!) 21   SpO2 98%   Physical Exam Constitutional:      Comments: Alert and appropriate.  Moderate tachypnea.  Nontoxic.  Clear mental status.  HENT:     Head: Normocephalic and atraumatic.  Eyes:     Extraocular Movements: Extraocular movements intact.  Cardiovascular:     Comments: Bradycardia.  Harsh systolic murmur at right sternal border. Pulmonary:     Comments: Tachypnea with mild to moderate increased work of breathing.  Crackles throughout the lung fields. Musculoskeletal:     Cervical back: Neck supple.     Comments: 2+ pitting edema bilateral lower extremities.  Calves are nontender.  Skin:    General: Skin is warm and dry.  Neurological:     General: No focal deficit present.     Mental Status: She is oriented to person, place, and time.     Coordination: Coordination normal.  Psychiatric:        Mood and Affect: Mood normal.     ED Results / Procedures / Treatments   Labs (all labs ordered are listed, but only abnormal results are displayed) Labs Reviewed  COMPREHENSIVE METABOLIC PANEL - Abnormal; Notable for the following components:      Result Value   Potassium 3.3 (*)    Glucose,  Bld 203 (*)    BUN 25 (*)    Creatinine, Ser 1.48 (*)    Calcium 8.2 (*)    Albumin 3.1 (*)    AST 13 (*)    GFR calc non Af Amer 34 (*)    GFR calc Af Amer 40 (*)    All other components within normal limits  BRAIN NATRIURETIC PEPTIDE - Abnormal; Notable for the following components:   B Natriuretic Peptide 2,131.7 (*)    All other components within normal limits  CBC WITH DIFFERENTIAL/PLATELET - Abnormal; Notable for the following components:   WBC 11.7 (*)    RBC 3.60 (*)    Hemoglobin 11.0 (*)    HCT 34.4 (*)    Neutro Abs 9.2 (*)    All other components within normal limits  TROPONIN I (HIGH SENSITIVITY) - Abnormal; Notable for the following components:   Troponin I (High Sensitivity) 24 (*)    All other components within normal limits  CULTURE, BLOOD (ROUTINE X 2)  CULTURE, BLOOD (ROUTINE X 2)  RESPIRATORY PANEL BY RT PCR (FLU A&B, COVID)  LIPASE, BLOOD  LACTIC ACID, PLASMA  MAGNESIUM  PHOSPHORUS  LACTIC ACID, PLASMA  URINALYSIS, ROUTINE W REFLEX MICROSCOPIC  TSH  I-STAT CHEM 8, ED  TROPONIN I (HIGH SENSITIVITY)    EKG EKG Interpretation  Date/Time:  Monday February 07 2019 13:09:19 EST Ventricular Rate:  51 PR Interval:    QRS Duration: 160 QT Interval:  469 QTC Calculation: 432 R Axis:   72 Text Interpretation: Junctional rhythm IVCD, consider atypical LBBB similar to previous tracing Confirmed by Charlesetta Shanks [phone]) on 02/07/2019 1:14:18 PM   Radiology DG Chest Port 1 View  Result Date: 02/07/2019 CLINICAL DATA:  Shortness of breath EXAM: PORTABLE CHEST 1 VIEW COMPARISON:  07/10/2018 FINDINGS: Prior CABG. Borderline heart size.  Linear densities in the lung bases. No effusions or confluent opacities. No acute bony abnormality. IMPRESSION: Linear densities in the lung bases could reflect atelectasis. Borderline heart size. Electronically Signed   By: Rolm Baptise M.D.   On: 02/07/2019 14:22    Procedures Procedures (including critical care time)  CRITICAL CARE Performed by: Charlesetta Shanks   Total critical care time: 30 minutes  Critical care time was exclusive of separately billable procedures and treating other patients.  Critical care was necessary to treat or prevent imminent or life-threatening deterioration.  Critical care was time spent personally by me on the following activities: development of treatment plan with patient and/or surrogate as well as nursing, discussions with consultants, evaluation of patient's response to treatment, examination of patient, obtaining history from patient or surrogate, ordering and performing treatments and interventions, ordering and review of laboratory studies, ordering and review of radiographic studies, pulse oximetry and re-evaluation of patient's condition. Medications Ordered in ED Medications  furosemide (LASIX) injection 40 mg (has no administration in time range)  nitroGLYCERIN (NITROGLYN) 2 % ointment 1 inch (has no administration in time range)  aspirin chewable tablet 324 mg (has no administration in time range)  furosemide (LASIX) injection 40 mg (has no administration in time range)  potassium chloride SA (KLOR-CON) CR tablet 40 mEq (has no administration in time range)    ED Course  I have reviewed the triage vital signs and the nursing notes.  Pertinent labs & imaging results that were available during my care of the patient were reviewed by me and considered in my medical decision making (see chart for details).  Clinical Course as of Feb 07 1507  Mon Feb 07, 2019  1457 Patient reports subjective improvement since getting Lasix.  She does look improved.  However, no urine output yet.  Will add additional 20 mg of Lasix to original 40 mg dose.  We will add topical nitroglycerin paste and aspirin.  Plan for admission.   [MP]  1507 Consult: Cardiology will evaluate in the emergency department and determine if primary admission or request medical admit   [MP]    Clinical  Course User Index [MP] Charlesetta Shanks, MD   MDM Rules/Calculators/A&P                      Patient presents with gradual onset of shortness of breath over about 4 days duration.  Patient does not describe symptoms suggestive of infectious source.  On examination patient has coarse crackles as well as bilateral lower extremity edema.  BNP is elevated.  At this time I have high suspicion for congestive heart failure.  Have started patient on Lasix with subjective improvement but no urine output yet.  Also have ordered nitroglycerin paste and aspirin.  Patient has bradycardia which is consistently stayed in the high 40s.  She does have metoprolol on her medication list.  Will need ongoing monitoring and review of rhythms by cardiology.  We will plan for admission. Final Clinical Impression(s) / ED Diagnoses Final diagnoses:  Other congestive heart failure ([place])  Bradycardia    Rx / DC Orders ED Discharge Orders    None       Charlesetta Shanks, MD 02/07/19 1505    Charlesetta Shanks, MD 02/07/19 1717

## 2019-02-07 NOTE — ED Notes (Signed)
Cardiology on-call paged about low heart rate  (35-40). Pt is alert and oriented at this time. Cards advised RN to page cardiology in the event pt becomes symptomatic or change in LOC from baseline.

## 2019-02-07 NOTE — ED Triage Notes (Signed)
Pt arrived from home c/o incresaing SOB x4 days. Pt endorses LE edema. Wheezes and crackles in all fields at time of triage. EMS reported 3rd degree heart block. Hr 48. bp 170/77, Sp02 98% on 2LPM, 89%  Ra. Pt is alert and oriented at time of triage.
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
## 2019-02-07 NOTE — ED Notes (Signed)
Pure Wick was placed on patient. 

## 2019-02-07 NOTE — H&P (Addendum)
History and Physical    [name] BFX:[id number] DOB: 02-11-1946 DOA: 02/07/2019  PCP: [name], MD  Patient coming from: Home I have personally briefly reviewed patient's old medical records in [place]  Chief Complaint: Shortness of breath and leg swelling since 4 days  HPI: [name] is a 73 y.o. female with medical history significant of hypertension, diabetes mellitus, coronary artery disease status post CABG with AVR in 2006-complicated by CVA, CKD stage III, insulin-dependent diabetes mellitus, hyperlipidemia, chronic diastolic congestive heart failure, history of adenocarcinoma of breast status post mastectomy in 1996, depression, arthritis presents to emergency department due to worsening shortness of breath and leg swelling since 4 days.  Patient reports exertional shortness of breath associated with leg edema, orthopnea, PND however denies association with chest pain, palpitation, nausea, vomiting, diarrhea, cough, congestion, wheezing, fever, chills, COVID-19 exposure, decreased appetite, weakness, lethargy or weight loss.  She tested positive for COVID-19 6 months ago.  She is compliant with her home medications and lives with her daughter and granddaughter at home.  No history of smoking, alcohol, illicit drug use.  ED Course: Upon arrival to ED: Patient bradycardic, tachypneic, hypoxic-placed on 2 L of oxygen via nasal cannula.  BNP elevated more than 2000, chest x-ray shows atelectasis and borderline heart.  Patient received Lasix 80 IV once in the ED.  EDP consulted cardiology for further evaluation and management.  Review of Systems: As per HPI otherwise negative.    Past Medical History:  Diagnosis Date  . Adenocarcinoma of breast ([place]) 1996   Completed tamoxifen and had mastectomy.  . Aortic stenosis, severe    s/p aortic valve replacement with porcine valve 06/2004.  ECHO 2010 EF 06%, LVH, diastolic dysfxn, Bioprostetic aoritc valve,  mild AS. ECHO 2013 EF 60%, Nl aortic artificial valve, dynamic obstruction in the outflow tract   Class IIb rec for annual TTE after 5 yrs. She had a TTE 2013    . Arthritis   . CAD (coronary artery disease) 2006   s/p CABG (5/06) w/ saphenous vein to RCA at time of AVR  . CKD (chronic kidney disease) stage 2, GFR 60-89 ml/min 06/03/2011   She is no longer taking NSAIDs.   Mar[place] Kitchen CVA (cerebral infarction) 2006   Post-op from AVR. Presumed embolic in nature. Carotid stenosis of R 60-79%. Repeat dopplers 4/10 no R stenosis and L stenosis of 1-29%.  . Depression    Controlled on Paxil  . Diabetes mellitus 1992   Dx 04/25/1990. Now insulin dependent, started 2008. On ACEI.   . Diverticulosis 2001  . Hyperlipidemia    Mgmt with a statin  . Hypertension    Requires 4 drug tx  . Osteoporosis 2006   DEXA 10/06 : L femur T -2.8, R -2.7. Lumbar T -2.4. On bisphosphonates and  Calcium / Vit D.  . Refusal of blood transfusions as patient is Jehovah's Witness     Past Surgical History:  Procedure Laterality Date  . ABDOMINAL HYSTERECTOMY  1987   for fibroids  . AORTIC VALVE REPLACEMENT  2006  . CHOLECYSTECTOMY    . CORONARY ARTERY BYPASS GRAFT  2006   Saphenous vein to RCA at time of AVR. Course complicated by acute respiratory failure, post-op PTX, ARI, ileus, CVA  . MASTECTOMY Left 1995   L for adenocarcinoma     reports that she has never smoked. She has never used smokeless tobacco. She reports current alcohol use. She reports that she does not  use drugs.  Allergies  Allergen Reactions  . 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," as the patient is a Jehovah's Witness    Family History  Problem Relation Age of Onset  . Diabetes Mother   . Hypertension Mother   . Alzheimer's disease Mother   . Heart disease Father 75       AMI at age 69 and 35  . Mental illness Sister   . Heart disease Sister 59       AMI  . Kidney disease Sister     Prior to Admission medications     Medication Sig Start Date End Date Taking? Authorizing Provider  Accu-Chek FastClix Lancets MISC Use to check blood sugar 4 times a day. diag code E11.22. insulin dependent 10/05/18   Burns Spain, MD  acetaminophen (TYLENOL) 500 MG tablet Take 2 tablets (1,000 mg total) by mouth every 6 (six) hours as needed. 10/21/18 10/21/19  Bloomfield, Carley D, DO  amLODipine (NORVASC) 10 MG tablet Take 1 tablet (10 mg total) by mouth daily. 10/21/18   Blo[place]field, Carley D, DO  aspirin 81 MG EC tablet TAKE 1 TABLET (81 MG TOTAL) BY MOUTH DAILY. SWALLOW WHOLE. 09/25/18   Eliezer Bottom, MD  atorvastatin (LIPITOR) 80 MG tablet Take 1 tablet (80 mg total) by mouth at bedtime. 09/25/18   Eliezer Bottom, MD  cetirizine (ZYRTEC) 10 MG tablet Take 1 tablet (10 mg total) by mouth daily. 09/25/18   Eliezer Bottom, MD  docusate sodium (COLACE) 100 MG capsule Take 1 capsule (100 mg total) by mouth daily as needed. 10/05/18 10/05/19  Burns Spain, MD  Emollient (EUCERIN) lotion Apply topically 2 (two) times daily.    [provider]  furosemide (LASIX) 40 MG tablet Take 1 tablet (40 mg total) by mouth daily. 10/05/18   Burns Spain, MD  glucose blood (CONTOUR NEXT TEST) test strip Use to check blood sugar 4 times daily. diag code E11.22. insulin dependent 10/05/18   Burns Spain, MD  hydrALAZINE (APRESOLINE) 50 MG tablet Take 1 tablet (50 mg total) by mouth 3 (three) times daily. 10/21/18   Bloomfield, Carley D, DO  Insulin Degludec-Liraglutide (XULTOPHY) 100-3.6 UNIT-MG/ML SOPN Inject 10 Units into the skin daily. 09/25/18   Eliezer Bottom, MD  Insulin Pen Needle (PEN NEEDLES 3/16") 31G X 5 MM MISC 1 each by Does not apply route 4 (four) times daily. Use to check blood sugars 4 times a day. Dx code E11.22 10/05/18   Burns Spain, MD  metoprolol succinate (TOPROL-XL) 25 MG 24 hr tablet Take 1 tablet (25 mg total) by mouth daily. 01/26/19   Burns Spain, MD  PARoxetine (PAXIL) 30 MG tablet  TAKE 1 TABLET BY MOUTH DAILY 11/17/18   Burns Spain, MD  polyethylene glycol [employer] / GLYCOLAX) packet Take 17 g by mouth daily. Patient taking differently: Take 17 g by mouth daily as needed for mild constipation.  06/18/17   Burns Spain, MD  sennosides-docusate sodium (SENOKOT-S) 8.6-50 MG tablet Take 1 tablet by mouth at bedtime. 09/25/18   Eliezer Bottom, MD  tetrahydrozoline 0.05 % ophthalmic solution Place 1 drop into both eyes as needed (dry eyes).    [provider]    Physical Exam: Vitals:   02/07/19 1313 02/07/19 1330 02/07/19 1530 02/07/19 1745  BP:  (!) 160/57 (!) 168/61 (!) 174/72  Pulse:  (!) 48 (!) 48 (!) 49  Resp:  (!) 21 19 (!) 24  Temp:      TempSrc:      SpO2: 98% 98% 98% 96%    Constitutional: NAD, calm, comfortable, on 2 L of oxygen via nasal cannula. Eyes: PERRL, lids and conjunctivae normal ENMT: Mucous membranes are moist. Posterior pharynx clear of any exudate or lesions.Normal dentition.  Neck: normal, supple, no masses, no thyromegaly Respiratory: Baseline crackles positive, no wheezing, no rhonchi. Cardiovascular: Regular rate and rhythm, no murmurs / rubs / gallops.  Bilateral 2+ pitting edema positive 2+ pedal pulses. No carotid bruits.  Abdomen: no tenderness, no masses palpated. No hepatosplenomegaly. Bowel sounds positive.  Musculoskeletal: no clubbing / cyanosis. No joint deformity upper and lower extremities. Good ROM, no contractures. Normal muscle tone.  Skin: no rashes, lesions, ulcers. No induration Neurologic: CN 2-12 grossly intact. Sensation intact, DTR normal. Strength 5/5 in all 4.  Psychiatric: Normal judgment and insight. Alert and oriented x 3. Normal mood.    Labs on Admission: I have personally reviewed following labs and imaging studies  CBC: Recent Labs  Lab 02/07/19 1323 02/07/19 1527  WBC 11.7*  --   NEUTROABS 9.2*  --   HGB 11.0* 10.9*  HCT 34.4* 32.0*  MCV 95.6  --   PLT 207  --    Basic  Metabolic Panel: Recent Labs  Lab 02/07/19 1323 02/07/19 1527  NA 143 142  K 3.3* 4.4  CL 110 108  CO2 23  --   GLUCOSE 203* 187*  BUN 25* 34*  CREATININE 1.48* 1.50*  CALCIUM 8.2*  --   MG 1.7  --   PHOS 3.6  --    GFR: CrCl cannot be calculated (Unknown ideal weight.). Liver Function Tests: Recent Labs  Lab 02/07/19 1323  AST 13*  ALT 12  ALKPHOS 96  BILITOT 1.0  PROT 6.5  ALBUMIN 3.1*   Recent Labs  Lab 02/07/19 1323  LIPASE 17   No results for input(s): AMMONIA in the last 168 hours. Coagulation Profile: No results for input(s): INR, PROTIME in the last 168 hours. Cardiac Enzymes: No results for input(s): CKTOTAL, CKMB, CKMBINDEX, TROPONINI in the last 168 hours. BNP (last 3 results) No results for input(s): PROBNP in the last 8760 hours. HbA1C: No results for input(s): HGBA1C in the last 72 hours. CBG: No results for input(s): GLUCAP in the last 168 hours. Lipid Profile: No results for input(s): CHOL, HDL, LDLCALC, TRIG, CHOLHDL, LDLDIRECT in the last 72 hours. Thyroid Function Tests: Recent Labs    02/07/19 1626  TSH 1.630   Anemia Panel: No results for input(s): VITAMINB12, FOLATE, FERRITIN, TIBC, IRON, RETICCTPCT in the last 72 hours. Urine analysis:    Component Value Date/Time   COLORURINE YELLOW 02/07/2019 [place] 02/07/2019 1746   APPEARANCEUR Clear 09/01/2018 1131   LABSPEC 1.012 02/07/2019 1746   PHURINE 5.0 02/07/2019 1746   GLUCOSEU NEGATIVE 02/07/2019 1746   GLUCOSEU NEG mg/dL 12/25/2006 2041   HGBUR NEGATIVE 02/07/2019 1746   HGBUR trace-lysed 03/05/2010 0856   BILIRUBINUR NEGATIVE 02/07/2019 1746   BILIRUBINUR Negative 09/01/2018 [place] 02/07/2019 1746   PROTEINUR 100 (A) 02/07/2019 1746   UROBILINOGEN 0.2 11/11/2011 1701   UROBILINOGEN 0.2 11/11/2011 1645   NITRITE NEGATIVE 02/07/2019 1746   LEUKOCYTESUR NEGATIVE 02/07/2019 1746    Radiological Exams on Admission: DG Chest Port 1  View  Result Date: 02/07/2019 CLINICAL DATA:  Shortness of breath EXAM: PORTABLE CHEST 1 VIEW COMPARISON:  07/10/2018 FINDINGS: Prior CABG. Borderline heart size. Linear  densities in the lung bases. No effusions or confluent opacities. No acute bony abnormality. IMPRESSION: Linear densities in the lung bases could reflect atelectasis. Borderline heart size. Electronically Signed   By: Rolm Baptise M.D.   On: 02/07/2019 14:22    EKG: Complete AV block with wide QRS complex  Assessment/Plan Principal Problem:   Acute on chronic diastolic CHF (congestive heart failure) (HCC) Active Problems:   DM (diabetes mellitus) (HCC)   Hyperlipidemia   CAD (coronary artery disease)   Hypertension   CKD (chronic kidney disease), stage III   Hypokalemia   Bradycardia   Acute on chronic diastolic (congestive) heart failure (HCC)   Acute on chronic diastolic congestive heart failure: -Patient presented with worsening shortness of breath associated with leg swelling, orthopnea, PND.  BNP elevated more than 2000, chest x-ray shows interstitial edema. -Patient is requiring 2 L of oxygen via nasal cannula.  COVID-19 and blood culture is pending.  TSH/lactic acid: WNL -Patient received 80 mg of IV Lasix in the ED. -We will admit patient on the floor.  On telemetry.  On continuous pulse ox. -Started on Lasix 40 mg IV twice daily -Monitor electrolytes closely. -Potassium is 3.3-replenished, magnesium is 1.7-replenished -Goal: Potassium more than 4, magnesium more than 2 -Strict INO's and daily weight -EDP consulted cardiology-appreciate help -Transthoracic echo is ordered and is pending. -Continue aspirin, statin, hydralazine  Hypertension: Blood pressure is elevated -Continue  hydralazine and monitor blood pressure closely. -Hold amlodipine and metoprolol for now due to bradycardia and AV block.  Diabetes mellitus: Check A1c -Started on sliding scale insulin and monitor blood sugar  closely  Hyperlipidemia: Check lipid panel -Continue statin  Hypokalemia/hypomagnesemia: -Likely secondary to Lasix use. -Replenished.  Repeat levels tomorrow AM.  Coronary artery disease status post CABG: -Status post AVR-at the time of CABG -Initial troponin 24-patient denies ACS symptoms. -We will trend troponin.  Continue aspirin, statin.  will hold metoprolol for now due to bradycardia and heart block.  Bradycardia/complete heart block: -Reviewed EKG.  TSH: WNL. -Hold amlodipine and beta-blocker for now.  On telemetry.  Monitor heart rate closely. -Repeat echo is pending.  CKD stage IIIb: -Creatinine is 1.48, GFR 40-at baseline. -Monitor kidney function closely  Depression/anxiety: Continue Paxil  Unable to safely start patient's home medication as pharmacy reconciliation is pending.  DVT prophylaxis: TED/SCD/Lovenox Code Status: Full code-confirmed with the patient.  Patient tells me that she is Jehovah witness and does not want blood transfusion. Family Communication: None present at bedside.  Plan of care discussed with patient in length and she verbalized understanding and agreed with it. Disposition Plan: To be determined Consults called: Cardiology by EDP Admission status: Inpatient  Mckinley Jewel MD [facility] Pager [phone]  If 7PM-7AM, please contact night-coverage [email or web address] Password [facility]  02/07/2019, 6:34 PM

## 2019-02-08 ENCOUNTER — Encounter (HOSPITAL_COMMUNITY): Payer: Self-pay | Admitting: Internal Medicine

## 2019-02-08 ENCOUNTER — Inpatient Hospital Stay (HOSPITAL_COMMUNITY): Payer: Medicare Other

## 2019-02-08 ENCOUNTER — Other Ambulatory Visit: Payer: Self-pay

## 2019-02-08 DIAGNOSIS — Z952 Presence of prosthetic heart valve: Secondary | ICD-10-CM

## 2019-02-08 DIAGNOSIS — E1122 Type 2 diabetes mellitus with diabetic chronic kidney disease: Secondary | ICD-10-CM

## 2019-02-08 DIAGNOSIS — I351 Nonrheumatic aortic (valve) insufficiency: Secondary | ICD-10-CM

## 2019-02-08 DIAGNOSIS — T82857A Stenosis of cardiac prosthetic devices, implants and grafts, initial encounter: Secondary | ICD-10-CM

## 2019-02-08 DIAGNOSIS — I2510 Atherosclerotic heart disease of native coronary artery without angina pectoris: Secondary | ICD-10-CM

## 2019-02-08 DIAGNOSIS — I350 Nonrheumatic aortic (valve) stenosis: Secondary | ICD-10-CM

## 2019-02-08 DIAGNOSIS — R011 Cardiac murmur, unspecified: Secondary | ICD-10-CM

## 2019-02-08 DIAGNOSIS — N182 Chronic kidney disease, stage 2 (mild): Secondary | ICD-10-CM

## 2019-02-08 DIAGNOSIS — I5030 Unspecified diastolic (congestive) heart failure: Secondary | ICD-10-CM

## 2019-02-08 DIAGNOSIS — Z79899 Other long term (current) drug therapy: Secondary | ICD-10-CM

## 2019-02-08 DIAGNOSIS — I080 Rheumatic disorders of both mitral and aortic valves: Secondary | ICD-10-CM

## 2019-02-08 LAB — GLUCOSE, CAPILLARY
Glucose-Capillary: 143 mg/dL — ABNORMAL HIGH (ref 70–99)
Glucose-Capillary: 124 mg/dL — ABNORMAL HIGH (ref 70–99)
Glucose-Capillary: 180 mg/dL — ABNORMAL HIGH (ref 70–99)
Glucose-Capillary: 141 mg/dL — ABNORMAL HIGH (ref 70–99)

## 2019-02-08 LAB — MAGNESIUM: Magnesium: 2.3 mg/dL (ref 1.7–2.4)

## 2019-02-08 LAB — BASIC METABOLIC PANEL
Anion gap: 9 (ref 5–15)
BUN: 26 mg/dL — ABNORMAL HIGH (ref 8–23)
CO2: 27 mmol/L (ref 22–32)
Calcium: 9 mg/dL (ref 8.9–10.3)
Chloride: 109 mmol/L (ref 98–111)
Creatinine, Ser: 1.58 mg/dL — ABNORMAL HIGH (ref 0.44–1.00)
GFR calc Af Amer: 37 mL/min — ABNORMAL LOW (ref >60)
GFR calc non Af Amer: 32 mL/min — ABNORMAL LOW (ref >60)
Glucose, Bld: 143 mg/dL — ABNORMAL HIGH (ref 70–99)
Potassium: 4.1 mmol/L (ref 3.5–5.1)
Sodium: 145 mmol/L (ref 135–145)

## 2019-02-08 LAB — ECHOCARDIOGRAM COMPLETE
Height: 60 in
Weight: 2670.21 oz

## 2019-02-08 LAB — CBG MONITORING, ED: Glucose-Capillary: 145 mg/dL — ABNORMAL HIGH (ref 70–99)

## 2019-02-08 MED ORDER — ENOXAPARIN SODIUM 30 MG/0.3ML ~~LOC~~ SOLN
30.0000 mg | SUBCUTANEOUS | Status: DC
  Administered 2019-02-08 – 2019-02-13 (×6): 30 mg via SUBCUTANEOUS
  Filled 2019-02-08 (×6): qty 0.3

## 2019-02-08 MED ORDER — INFLUENZA VAC A&B SA ADJ QUAD 0.5 ML IM PRSY
0.5000 mL | PREFILLED_SYRINGE | INTRAMUSCULAR | Status: AC
  Administered 2019-02-11: 0.5 mL via INTRAMUSCULAR
  Filled 2019-02-08: qty 0.5

## 2019-02-08 NOTE — Progress Notes (Signed)
   Subjective: Patient feeling better this morning. She continues to have shortness of breath. She is not ambulated. She states that nobody is talk to her about her echocardiogram.  Objective: Vital signs in last 24 hours: Vitals:   02/08/19 0733 02/08/19 0944 02/08/19 1000 02/08/19 1100  BP: (!) 149/53 (!) 163/69 (!) 148/58   Pulse: (!) 40 (!) 44 (!) 44   Resp: $Remo'19  19 17  'obHcG$ Temp: 98.3 F (36.8 C)     TempSrc: Oral     SpO2: 100%  97% 97%  Weight:      Height:       General: Elderly female in no acute distress Pulm: Good air movement with bibasilar crackles  CV: RRR, crescendo-decrescendo systolic murmur with blunting of S2, no JVD  Extremities: No LE edema   Assessment/Plan:  [name] is a 73 y.o female with HFpEF, s/p bioprosthetic AVR in 2006, CKD Stage II, and CAD s/p CABG who was admitted on 12/14 for progressive dyspnea on exertion and lower extremity edema. Her BNP was found to be elevated at greater than 2000. Repeat echocardiogram today illustrated severe bioprosthetic aortic valve stenosis and mitral valve stenosis. She subsequently being co-managed with cardiology and cardiothoracic surgery has been consulted for further evaluation.  HFpEF CAD s/p CABG Severe bioprosthetic aortic valve stenosis and mitral valve stenosis - Severe aortic stenosis by mean gradient and valve area  - Severe mitral valve stenosis by gradient and valve area  - On PE she appears euvolemic but by echocardiogram she is intravascularly hypervolemic. Judicious use of lasix. Need to be careful not to decrease preload too much  - Appreciate cardiology and cardiothoracic surgery consultation   Third-degree heart block - Continues to have intermittent 3rd heart block on telemetry. - Her beta blocker has been stopped and will continue to monitor. - Pacer pads in place - May need EP consult if her heart blocked is not resolved.  CKD Stage II - Creatinine and GFR at baseline  - Continue to  monitor and limit nephrotoxic medications.   Diabetes Mellitus  - CBGs at goal of < 180  - Continue SSI  Dispo: Pending improvement in respiratory status and plans for valve replacement.   Ina Homes, MD 02/08/2019, 11:54 AM Pager: [phone]

## 2019-02-08 NOTE — Consult Note (Addendum)
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  EKG is reviewed and shows complete heart block, left bundle branch block, heart rate 44 bpm.  Echo is reviewed and shows severe concentric LVH with a small LV cavity, cavity obliteration, severe bioprosthetic aortic stenosis with peak and mean gradients of 89 and 55 mmHg, respectively.  Peak transaortic velocity is 4.7 m/s.  The mitral valve is also thickened with severe MAC and moderately severe mitral stenosis.  There is evidence of severe pulmonary hypertension based on TR velocity.  I had a lengthy discussion with the patient this evening.  Her daughter is conferenced in on speaker phone.  The patient is very sedentary at baseline.  Her symptoms have progressed over the last several weeks and she has become increasingly short of breath and weak.  As noted above, the patient was hospitalized with COVID-19 pneumonia earlier this year, complicated by acute kidney injury.  She now has evidence of acute on chronic diastolic heart failure and complete heart block in the setting of critical  bioprosthetic aortic stenosis.  I have reviewed her previous echo studies dating back to 2017 and her gradients have increased significantly since that time.  Treatment options are reviewed with the patient and her daughter.  Primary issues are as follows:  Conduction disease with complete heart block/left bundle branch block: Recommend formal EP consultation for consideration of permanent pacemaker prior to TAVR.  The patient has been on beta-blocker therapy, but she has high-grade conduction disease and would be at exceedingly high risk of post-TAVR pacemaker dependence.  Acute on chronic diastolic heart failure: Patient on appropriate medical treatment including IV diuresis per primary team.  Will be difficult to fully treat with her significant valvular disease.  Critical bioprosthetic aortic stenosis: Multidisciplinary heart valve team will review her case.  I do not think she would be a candidate for sternotomy/conventional surgery.  Valve in valve TAVR would be a reasonable consideration.  I discussed work-up including right and left heart catheterization and CT angiography studies with the patient and her daughter.  They understand that the studies will need to be staged in order to lower her risk of contrast-induced nephropathy.  She is at very high risk of contrast-induced nephropathy with a background of diabetes, chronic kidney disease, previous kidney injury, and congestive heart failure.  Despite all of her current problems, she has been functionally independent and wants to proceed with any treatment that would help her quality of life and longevity.  I think it is reasonable to proceed as above.  We will help arrange her studies following EP consultation tomorrow.  Sherren Mocha, M.D. 02/08/2019 5:31 PM

## 2019-02-08 NOTE — Progress Notes (Addendum)
Progress Note  Patient Name: [name] Date of Encounter: 02/08/2019  Primary Cardiologist: [name], MD   Subjective   Patient is feeling better. Breathing is better. No chest pain  Inpatient Medications    Scheduled Meds: . enoxaparin (LOVENOX) injection  40 mg Subcutaneous Q24H  . furosemide  40 mg Intravenous Q12H  . insulin aspart  0-15 Units Subcutaneous TID WC  . insulin aspart  0-5 Units Subcutaneous QHS  . nitroGLYCERIN  1 inch Topical Q6H  . sodium chloride flush  3 mL Intravenous Q12H   Continuous Infusions: . sodium chloride     PRN Meds: sodium chloride, acetaminophen, hydrALAZINE, ondansetron (ZOFRAN) IV, sodium chloride flush   Vital Signs    Vitals:   02/08/19 0208 02/08/19 0215 02/08/19 0443 02/08/19 0733  BP: (!) 153/57  (!) 153/49 (!) 149/53  Pulse: (!) 39  (!) 37 (!) 40  Resp: $Remo'20  20 19  'gHYGK$ Temp: 98.4 F (36.9 C)  98.5 F (36.9 C) 98.3 F (36.8 C)  TempSrc: Oral  Oral Oral  SpO2: 100%  99% 100%  Weight:  75.7 kg    Height:  5' (1.524 m)      Intake/Output Summary (Last 24 hours) at 02/08/2019 0826 Last data filed at 02/07/2019 2045 Gross per 24 hour  Intake 50 ml  Output --  Net 50 ml   Last 3 Weights 02/08/2019 10/21/2018 09/29/2018  Weight (lbs) 166 lb 14.2 oz 163 lb 3.2 oz 159 lb 1.6 oz  Weight (kg) 75.7 kg 74.027 kg 72.167 kg      Telemetry    Sinus bradycardia, HR 39-40s, first degree AV block, - Personally Reviewed  ECG     No new- Personally Reviewed  Physical Exam   GEN: No acute distress.   Neck: No JVD Cardiac: RRR, systolic murmur, rubs, or gallops.  Respiratory: Crackles bilaterally. GI: Soft, nontender, non-distended  MS: No edema; No deformity. Neuro:  Nonfocal  Psych: Normal affect   Labs    High Sensitivity Troponin:   Recent Labs  Lab 02/07/19 1323 02/07/19 1626  TROPONINIHS 24* 26*      Chemistry Recent Labs  Lab 02/07/19 1323 02/07/19 1527 02/08/19 0556  NA 143 142 145  K  3.3* 4.4 4.1  CL 110 108 109  CO2 23  --  27  GLUCOSE 203* 187* 143*  BUN 25* 34* 26*  CREATININE 1.48* 1.50* 1.58*  CALCIUM 8.2*  --  9.0  PROT 6.5  --   --   ALBUMIN 3.1*  --   --   AST 13*  --   --   ALT 12  --   --   ALKPHOS 96  --   --   BILITOT 1.0  --   --   GFRNONAA 34*  --  32*  GFRAA 40*  --  37*  ANIONGAP 10  --  9     Hematology Recent Labs  Lab 02/07/19 1323 02/07/19 1527  WBC 11.7*  --   RBC 3.60*  --   HGB 11.0* 10.9*  HCT 34.4* 32.0*  MCV 95.6  --   MCH 30.6  --   MCHC 32.0  --   RDW 14.0  --   PLT 207  --     BNP Recent Labs  Lab 02/07/19 1323  BNP 2,131.7*     DDimer No results for input(s): DDIMER in the last 168 hours.   Radiology    DG Chest [facility]  Result Date: 02/07/2019 CLINICAL DATA:  Shortness of breath EXAM: PORTABLE CHEST 1 VIEW COMPARISON:  07/10/2018 FINDINGS: Prior CABG. Borderline heart size. Linear densities in the lung bases. No effusions or confluent opacities. No acute bony abnormality. IMPRESSION: Linear densities in the lung bases could reflect atelectasis. Borderline heart size. Electronically Signed   By: Rolm Baptise M.D.   On: 02/07/2019 14:22    Cardiac Studies   Echo pending  Echo 3019 - Left ventricle: The cavity size was normal. Wall thickness was   increased in a pattern of moderate LVH. Systolic function was   normal. The estimated ejection fraction was in the range of 60%   to 65%. Wall motion was normal; there were no regional wall   motion abnormalities. Features are consistent with a pseudonormal   left ventricular filling pattern, with concomitant abnormal   relaxation and increased filling pressure (grade 2 diastolic   dysfunction). - Aortic valve: A bioprosthetic valve is sitting well in the aortic   position. There was moderate stenosis. There was trivial   regurgitation. Mean gradient (S): 28 mm Hg. Peak gradient (S): 49   mm Hg. - Mitral valve: Severely calcified annulus. The findings  are   consistent with mild stenosis. There was no significant   regurgitation. Mean gradient (D): 5 mm Hg. Valve area by pressure   half-time: 1.93 cm^2. - Left atrium: The atrium was mildly dilated. - Right ventricle: The cavity size was normal. Systolic function   was normal. - Pulmonary arteries: No complete TR doppler jet so unable to   estimate PA systolic pressure. - Inferior vena cava: The vessel was normal in size. The   respirophasic diameter changes were in the normal range (= 50%),   consistent with normal central venous pressure.  Patient Profile     73 y.o. 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   Assessment & Plan    SOB/Acute on Chronic diastolic Heart Failure - echo in 2019 showed preserved EF and Grade 2 DD - BNP > 2000 and CXR with atelectasis - Was on lasix 40 mg at home - Patient started on IV Lasix 40 mg - Echo results pending - I/Os not complete - creatinine 1.5 > 1.58 (has had some elevated creatinine in the past, up to 2.6) - Patient appears euvolemic. Breathing is better but crackles on exam. Continue diuresis. Monitor creatinine  S/P AVR (tissue valve, 2006) - echo 2019 showed moderate AS - Echo pending  Bradycardia/CHB/LBBB - BB stopped - plan for BB washout - HR still low, upper 30s to 40s - If still bradycardic might need EP consult for PPM  CKD stage II - creatinine 1.5 (around baseline). Today 1.58 - potassium stable  CAD s/p CABG x 1 - Hs troponin 24 > 26 - No plans for further ischemic work-up  DM2 - A1C 6.8 - per IM  For questions or updates, please contact [place] Please consult [email or web address] for contact info under        Signed, Cadence Ninfa Meeker, PA-C  02/08/2019, 8:26 AM     ---------------------------------------------------------------------------------------------   History and all data above reviewed.  Patient examined.  I agree with the findings as above.  [name] is a pleasant 72 year old female with presentation for shortness of breath and  self-reported lower extremity swelling.  Echocardiogram performed today shows severe bioprosthetic aortic valve stenosis with mean gradient of 55 mmHg and dimensionless index of 0.24.  In addition she has a mean mitral diastolic gradient of 10 mmHg at a heart rate of 45 bpm.   Constitutional: No acute distress Cardiovascular: Regular, bradycardic rhythm.  There is a 3 out of 6 systolic ejection murmur heard best over the right upper sternal border.  S1 is present but quiet, S2 seems preserved. Respiratory: Faint rhonchi mid lung to base. GI : normal bowel sounds, soft and nontender. No distention.   MSK: extremities warm, well perfused. No edema.  NEURO: grossly nonfocal exam, moves all extremities. PSYCH: alert and oriented x 3, normal mood and affect.   All available labs, radiology testing, previous records reviewed. Agree with documented assessment and plan of my colleague as stated above with the following additions or changes:  Principal Problem:   Acute on chronic diastolic CHF (congestive heart failure) (HCC) Active Problems:   DM (diabetes mellitus) ([place])   Hyperlipidemia   CAD (coronary artery disease)   Hypertension   CKD (chronic kidney disease), stage III   Hypokalemia   Bradycardia   Acute on chronic diastolic (congestive) heart failure (HCC)    Plan: The patient had her echocardiogram this morning demonstrating multi valvular disease with severe bioprosthetic aortic valve stenosis.  She has had moderate aortic stenosis documented on previous echoes, so this may be the expected course of degeneration of the valve, however she did have COVID-19 infection in the summer.  I do wonder if  there has been some subacute valve thrombosis contributing to the significantly increased gradients from moderate a year and a half ago to very severe currently.  In addition she has a mitral mean diastolic gradient of 10 mmHg at a heart rate of 45.  This is while she is in high-grade AV block.  However again I wonder if when she is in a normal rhythm her gradient is quite a bit higher contributing to her symptoms.  I will plan to obtain the opinion of our colleagues in cardiovascular surgery today regarding her options for symptomatic severe bioprosthetic aortic valve stenosis.  I suspect the mitral valve will be challenging to manage given the degree of mitral annular calcification.  I have calculated an STS score. STS score 10-11%.  Given that the patient has had a previous sternotomy, she may not be the optimal candidate for redo sternotomy.  I look forward to discussions with cardiovascular surgery understanding the patient is high risk.  I also suspect her significant elevation in BNP may be due to valvular disease more so than gross volume overload.  It would be reasonable to continue Lasix today for purposes of decongesting, however would be cautious not to over diurese with 2 stenotic valvular lesions.  The patient's symptoms were shortness of breath and lower extremity swelling, however the patient has no lower extremity edema today and chest x-ray on admission was not tremendously congested.  We will likely convert to oral Lasix tomorrow so as not to create more renal dysfunction and overdiuresis.  The pacing pads have been placed again for high-grade AV block.  She has not had recent syncope that she is aware of, and is still undergoing beta-blocker washout.  We discussed possible indications for pacemaker but will await further information.  Currently hemodynamically stable.  Time Spent Directly with Patient:  I have spent a total of 45 minutes with the patient reviewing hospital  notes,  telemetry, EKGs, labs and examining the patient as well as establishing an assessment and plan that was discussed personally with the patient.  > 50% of time was spent in direct patient care.  Length of Stay:  LOS: 1 day   Elouise Munroe, MD [facility] 10:58 AM  02/08/2019

## 2019-02-08 NOTE — Progress Notes (Signed)
Paged on-call provider for patient's Heart rate that dropped down to 28. Pt denies any chest pain or discomfort at this moment. Will continue monitoring patient closely

## 2019-02-08 NOTE — H&P (View-Only) (Signed)
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 intact.  EKG is reviewed and shows complete heart block, left bundle branch block, heart rate 44 bpm.  Echo is reviewed and shows severe concentric LVH with a small LV cavity, cavity obliteration, severe bioprosthetic aortic stenosis with peak and mean gradients of 89 and 55 mmHg, respectively.  Peak transaortic velocity is 4.7 m/s.  The mitral valve is also thickened with severe MAC and moderately severe mitral stenosis.  There is evidence of severe pulmonary hypertension based on TR velocity.  I had a lengthy discussion with the patient this evening.  Her daughter is conferenced in on speaker phone.  The patient is very sedentary at baseline.  Her symptoms have progressed over the last several weeks and she has become increasingly short of breath and weak.  As noted above, the patient was hospitalized with COVID-19 pneumonia earlier this year, complicated by acute kidney injury.  She now has evidence of acute on chronic diastolic heart failure and complete heart block in the setting of critical  bioprosthetic aortic stenosis.  I have reviewed her previous echo studies dating back to 2017 and her gradients have increased significantly since that time.  Treatment options are reviewed with the patient and her daughter.  Primary issues are as follows:  Conduction disease with complete heart block/left bundle branch block: Recommend formal EP consultation for consideration of permanent pacemaker prior to TAVR.  The patient has been on beta-blocker therapy, but she has high-grade conduction disease and would be at exceedingly high risk of post-TAVR pacemaker dependence.  Acute on chronic diastolic heart failure: Patient on appropriate medical treatment including IV diuresis per primary team.  Will be difficult to fully treat with her significant valvular disease.  Critical bioprosthetic aortic stenosis: Multidisciplinary heart valve team will review her case.  I do not think she would be a candidate for sternotomy/conventional surgery.  Valve in valve TAVR would be a reasonable consideration.  I discussed work-up including right and left heart catheterization and CT angiography studies with the patient and her daughter.  They understand that the studies will need to be staged in order to lower her risk of contrast-induced nephropathy.  She is at very high risk of contrast-induced nephropathy with a background of diabetes, chronic kidney disease, previous kidney injury, and congestive heart failure.  Despite all of her current problems, she has been functionally independent and wants to proceed with any treatment that would help her quality of life and longevity.  I think it is reasonable to proceed as above.  We will help arrange her studies following EP consultation tomorrow.  Sherren Mocha, M.D. 02/08/2019 5:31 PM

## 2019-02-08 NOTE — Progress Notes (Addendum)
  Date: 02/08/2019  Patient name: [name]  Medical record number: [id number]  Date of birth: May 29, 1945   I have seen and evaluated this patient and I have discussed the plan of care with the house staff. Please see their note for complete details. I concur with their findings with the following additions/corrections:   73 yo woman admitted with progressive dyspnea found to have significant progression of stenosis of her bioprosthetic aortic valve and mitral stenosis, also with new 3rd degree heart block. She still has heaviness in her chest and gets short of breath moving around in bed, but is fairly comfortable at rest. Appreciate multiple cardiology specialist consultations, complex situation and may not be a candidate for open valve replacement, but possible for valve-in-valve TAVR. Will need EP consultation first given complete heart block. We have held BB, heart rate has improved, maintaining BP, but still bradycardic in the 50s.   Still mildly volume overloaded, diuresing today, but will need to be cautious not to overdiurese as she has severely restrictive valve lesions.  I personally updated the patient and her daughter, [name], who is a Neuro ICU nurse here at [facility] and would like to be kept up to date on her care. I personally spent >35 minutes coordinating her care.  Lenice Pressman, M.D., Ph.D. 02/08/2019, 7:21 PM

## 2019-02-08 NOTE — Progress Notes (Signed)
CVTS consulted for severe bioprosthetic aortic valve stenosis

## 2019-02-08 NOTE — Evaluation (Signed)
Physical Therapy Evaluation Patient Details Name: [name] MRN: [id number] DOB: July 30, 1945 Today's Date: 02/08/2019   History of Present Illness  [name] is a 73 y.o. female with medical history significant of hypertension, diabetes mellitus, coronary artery disease status post CABG with AVR in 2006-complicated by CVA, CKD stage III, insulin-dependent diabetes mellitus, hyperlipidemia, chronic diastolic congestive heart failure, history of adenocarcinoma of breast status post mastectomy in 1996, depression, arthritis presents to emergency department due to worsening shortness of breath and leg swelling since 4 days.  Clinical Impression  Pt admitted with above. PTA pt was able to amb with a walker and required minA for lower body dressing and bathing. Pt now very deconditioned requiring maxA to transfer to EOB and is unable to maintain EOB balance without maxA. Pt with residual L sided weakness however she demonstrates a significant regression in functional mobility. Discussed with patient about going to rehab first and she agreed to inpatient rehab to get stronger prior to returning home with family. Acute PT to cont to follow.    Follow Up Recommendations CIR;Supervision/Assistance - 24 hour    Equipment Recommendations  None recommended by PT(TBD at next venue)    Recommendations for Other Services Rehab consult     Precautions / Restrictions Precautions Precautions: Fall Precaution Comments: residual L sided weakness from previous stroke Restrictions Weight Bearing Restrictions: No      Mobility  Bed Mobility Overal bed mobility: Needs Assistance Bed Mobility: Rolling;Sidelying to Sit;Sit to Supine Rolling: Max assist Sidelying to sit: Max assist   Sit to supine: Max assist   General bed mobility comments: pt unable to initiate LE movement, pt attempted to reach with L UE for rail to roll to the R however ultimately required maxA to achieve sidelying, maxA  for trunk elevation, and then max A to bring LEs back into the bed, pt also then required maxAx2 to scoot up to [facility], pt unable to maintain EOB sitting  Transfers                 General transfer comment: unable to attempt safely today with assist of 1 person, pt unable to maintain EOB balance without maxA  Ambulation/Gait             General Gait Details: unable this date  Stairs            Wheelchair Mobility    Modified Rankin (Stroke Patients Only)       Balance Overall balance assessment: Needs assistance Sitting-balance support: Feet supported;Bilateral upper extremity supported Sitting balance-Leahy Scale: Zero Sitting balance - Comments: pt unable to maintain midline or upright posture without maxA pt with strong posterior lean, encouraged pt to use bilat UE to hold self up however pt unable Postural control: Posterior lean     Standing balance comment: unable to stand today                             Pertinent Vitals/Pain Pain Assessment: No/denies pain    Home Living Family/patient expects to be discharged to:: Private residence Living Arrangements: Children(dtr and 2 granddaughters) Available Help at Discharge: Family;Available 24 hours/day Type of Home: Apartment Home Access: Level entry     Home Layout: One level Home Equipment: Walker - 2 wheels;Shower seat;Wheelchair - manual      Prior Function Level of Independence: Needs assistance   Gait / Transfers Assistance Needed: pt was using a RW for ambulation  ADL's / Homemaking Assistance Needed: pt required some help with lower body dressing, pt reports having an aide that comes T, TH, Friday to assist with shower, doesn't drive        Hand Dominance   Dominant Hand: Right    Extremity/Trunk Assessment   Upper Extremity Assessment Upper Extremity Assessment: Generalized weakness(L UE worse than the R)    Lower Extremity Assessment Lower Extremity Assessment: LLE  deficits/detail;RLE deficits/detail RLE Deficits / Details: pt unable to achieve full LAQ or resistance resistance during MMT, able to complete quad set and move ankle into DF to neutral LLE Deficits / Details: very weak from previous stroke, pt with minimal active movement at this time, pt with noted icnreased tone limited passive ROM to all joing as well, able to achieve knee flexion to 45 deg LLE Coordination: decreased gross motor    Cervical / Trunk Assessment Cervical / Trunk Assessment: Normal  Communication   Communication: No difficulties  Cognition Arousal/Alertness: Awake/alert(pt c/o "I'm just so tired") Behavior During Therapy: WFL for tasks assessed/performed Overall Cognitive Status: Within Functional Limits for tasks assessed                                 General Comments: pt c/o fatigue making her sleepy but pt able to participate in PT      General Comments General comments (skin integrity, edema, etc.): HR was 40-44bpm the entire session, pt denies dizziness or lightheadedness    Exercises     Assessment/Plan    PT Assessment Patient needs continued PT services  PT Problem List Decreased strength;Decreased range of motion;Decreased activity tolerance;Decreased balance;Decreased mobility;Decreased coordination;Decreased knowledge of use of DME       PT Treatment Interventions DME instruction;Gait training;Stair training;Functional mobility training;Therapeutic activities;Therapeutic exercise;Balance training;Neuromuscular re-education;Cognitive remediation;Patient/family education    PT Goals (Current goals can be found in the Care Plan section)  Acute Rehab PT Goals Patient Stated Goal: get stronger PT Goal Formulation: With patient Time For Goal Achievement: 02/22/19 Potential to Achieve Goals: Fair    Frequency Min 3X/week   Barriers to discharge        Co-evaluation               AM-PAC PT "6 Clicks" Mobility  Outcome  Measure Help needed turning from your back to your side while in a flat bed without using bedrails?: A Lot Help needed moving from lying on your back to sitting on the side of a flat bed without using bedrails?: A Lot Help needed moving to and from a bed to a chair (including a wheelchair)?: Total Help needed standing up from a chair using your arms (e.g., wheelchair or bedside chair)?: Total Help needed to walk in hospital room?: Total Help needed climbing 3-5 steps with a railing? : Total 6 Click Score: 8    End of Session Equipment Utilized During Treatment: Gait belt Activity Tolerance: Patient tolerated treatment well Patient left: in bed;with call bell/phone within reach;with bed alarm set Nurse Communication: Mobility status PT Visit Diagnosis: Unsteadiness on feet (R26.81);Difficulty in walking, not elsewhere classified (R26.2)    Time: 6283-1517 PT Time Calculation (min) (ACUTE ONLY): 24 min   Charges:   PT Evaluation $PT Eval Moderate Complexity: 1 Mod PT Treatments $Therapeutic Activity: 8-22 mins        Kittie Plater, PT, DPT Acute Rehabilitation Services Pager #: [phone] Office #: [phone]   Berline Lopes 02/08/2019,  2:28 PM

## 2019-02-08 NOTE — TOC Initial Note (Signed)
Transition of Care [facility]) - Initial/Assessment Note    Patient Details  Name: [name] MRN: [id number] Date of Birth: Jul 13, 1945  Transition [facility]) CM/SW Contact:    [name], [place] Phone Number: [phone] 02/08/2019, 12:56 PM  Clinical Narrative:                  CSW consulted for home health screen, CSW met with patient to offer home health services. Patient reports she has support from her daughter at home and states that she already has an aide that comes in the home 3x a week through [place]. She reports no further needs at home she can think of, however she will ask RN to speak to social work if she thinks of anything.   Expected Discharge Plan: Home/Self Care Barriers to Discharge: Continued Medical Work up   Patient Goals and CMS Choice Patient states their goals for this hospitalization and ongoing recovery are:: to go home CMS Medicare.gov Compare Post Acute Care list provided to:: Patient Choice offered to / list presented to : Patient  Expected Discharge Plan and Services Expected Discharge Plan: Home/Self Care       Living arrangements for the past 2 months: Single Family Home                                      Prior Living Arrangements/Services Living arrangements for the past 2 months: Single Family Home Lives with:: Adult Children Patient language and need for interpreter reviewed:: Yes Do you feel safe going back to the place where you live?: Yes      Need for Family Participation in Patient Care: Yes (Comment) Care giver support system in place?: Yes (comment) Current home services: Homehealth aide([facility]) Criminal Activity/Legal Involvement Pertinent to Current Situation/Hospitalization: No - Comment as needed  Activities of Daily Living Home Assistive Devices/Equipment: Wheelchair, Environmental consultant (specify type) ADL Screening (condition at time of admission) Patient's cognitive ability adequate to safely complete  daily activities?: Yes Is the patient deaf or have difficulty hearing?: No Does the patient have difficulty seeing, even when wearing glasses/contacts?: No Does the patient have difficulty concentrating, remembering, or making decisions?: No Patient able to express need for assistance with ADLs?: Yes Does the patient have difficulty dressing or bathing?: Yes Independently performs ADLs?: No Communication: Independent Dressing (OT): Needs assistance Is this a change from baseline?: Pre-admission baseline Grooming: Needs assistance Is this a change from baseline?: Pre-admission baseline Feeding: Independent Bathing: Needs assistance Is this a change from baseline?: Pre-admission baseline Toileting: Needs assistance Is this a change from baseline?: Pre-admission baseline In/Out Bed: Needs assistance Is this a change from baseline?: Pre-admission baseline Walks in Home: Needs assistance Is this a change from baseline?: Pre-admission baseline Does the patient have difficulty walking or climbing stairs?: Yes Weakness of Legs: Both Weakness of Arms/Hands: None  Permission Sought/Granted Permission sought to share information with : Case Manager, Customer service manager, Family Supports Permission granted to share information with : Yes, Verbal Permission Granted  Share Information with NAME: [name]     Permission granted to share info w Relationship: daughter  Permission granted to share info w Contact Information: [phone]  Emotional Assessment Appearance:: Appears stated age Attitude/Demeanor/Rapport: Gracious Affect (typically observed): Calm Orientation: : Oriented to Self, Oriented to Place, Oriented to  Time, Oriented to Situation Alcohol / Substance Use: Not Applicable Psych Involvement: No (comment)  Admission diagnosis:  Bradycardia [R00.1] Other congestive heart failure (HCC) [I50.9] Acute on chronic diastolic (congestive) heart failure ([place]) [I50.33] Patient  Active Problem List   Diagnosis Date Noted  . Acute on chronic diastolic (congestive) heart failure ([place]) 02/07/2019  . Acute on chronic diastolic CHF (congestive heart failure) ([place]) 06/22/2017  . Peripheral arterial disease ([place]) 06/18/2017  . B12 deficiency 05/01/2017  . Edema of left lower extremity 11/08/2015  . Aortic atherosclerosis ([place]) 06/07/2014  . Abnormality of gait 05/29/2012  . HOCM (hypertrophic obstructive cardiomyopathy) ([place]) 06/11/2011  . Acute on chronic renal failure ([place]) 05/25/2011  . Routine health maintenance 06/10/2010  . Hyperlipidemia   . Refusal of blood transfusions as patient is Jehovah's Witness   . History of adenocarcinoma of breast   . Osteoporosis   . Status post CVA   . Aortic stenosis s/p Tissure AVR 2006   . CAD (coronary artery disease)   . Hypertension   . Depression 01/23/2006  . DM (diabetes mellitus) ([place]) 03/25/1990  . CKD (chronic kidney disease), stage III 1992  . Hypokalemia 1992  . Bradycardia 1992   PCP:  Audrey Crews, MD Pharmacy:   [facility], [address] [place] [address] Phone: [phone] Fax: [phone]  [id number] [place] [place] [address] [address]Phone: [phone]: [phone]    Social Determinants of Health (SDOH) Interventions    Readmission Risk Interventions No flowsheet data found.

## 2019-02-08 NOTE — Plan of Care (Signed)
Alert and oriented X4. Skin warm and dry. Able to communicate needs. Pt has been oriented to the room. Call bell is within reach. No respiratory distress is needed at the moment. Will monitor pt closely.

## 2019-02-08 NOTE — Progress Notes (Signed)
Rehab Admissions Coordinator Note:  Per PT recommendation patient was screened by Michel Santee, PT, DPT for appropriateness for an Inpatient Acute Rehab Consult.  Note CVTS consult pending and medical workup/planning regarding mitral/aortic valve stenosis in process.  Will not place a consult at this time, but follow for medical necessity.   Michel Santee, PT, DPT 02/08/2019, 2:46 PM  I can be reached at [phone].

## 2019-02-08 NOTE — Progress Notes (Signed)
Echocardiogram 2D Echocardiogram has been performed.  Oneal Deputy Arvis Zwahlen 02/08/2019, 9:21 AM

## 2019-02-09 ENCOUNTER — Encounter (HOSPITAL_COMMUNITY)
Admission: EM | Disposition: A | Discharge status: Skilled Nursing Facility | Payer: Self-pay | Source: Home / Self Care | Attending: Internal Medicine

## 2019-02-09 ENCOUNTER — Inpatient Hospital Stay (HOSPITAL_COMMUNITY): Payer: Medicare Other

## 2019-02-09 ENCOUNTER — Inpatient Hospital Stay (HOSPITAL_COMMUNITY)
Admission: EM | Disposition: A | Discharge status: Skilled Nursing Facility | Payer: Self-pay | Source: Home / Self Care | Attending: Internal Medicine

## 2019-02-09 DIAGNOSIS — F329 Major depressive disorder, single episode, unspecified: Secondary | ICD-10-CM

## 2019-02-09 DIAGNOSIS — I442 Atrioventricular block, complete: Secondary | ICD-10-CM

## 2019-02-09 DIAGNOSIS — E785 Hyperlipidemia, unspecified: Secondary | ICD-10-CM

## 2019-02-09 DIAGNOSIS — Z01810 Encounter for preprocedural cardiovascular examination: Secondary | ICD-10-CM

## 2019-02-09 HISTORY — PX: PACEMAKER IMPLANT: EP1218

## 2019-02-09 HISTORY — PX: RIGHT HEART CATH AND CORONARY/GRAFT ANGIOGRAPHY: CATH118265

## 2019-02-09 LAB — POCT I-STAT EG7
Acid-Base Excess: 4 mmol/L — ABNORMAL HIGH (ref 0.0–2.0)
Acid-Base Excess: 4 mmol/L — ABNORMAL HIGH (ref 0.0–2.0)
Bicarbonate: 29.6 mmol/L — ABNORMAL HIGH (ref 20.0–28.0)
Bicarbonate: 29.9 mmol/L — ABNORMAL HIGH (ref 20.0–28.0)
Calcium, Ion: 1.14 mmol/L — ABNORMAL LOW (ref 1.15–1.40)
Calcium, Ion: 1.19 mmol/L (ref 1.15–1.40)
HCT: 28 % — ABNORMAL LOW (ref 36.0–46.0)
HCT: 28 % — ABNORMAL LOW (ref 36.0–46.0)
Hemoglobin: 9.5 g/dL — ABNORMAL LOW (ref 12.0–15.0)
Hemoglobin: 9.5 g/dL — ABNORMAL LOW (ref 12.0–15.0)
O2 Saturation: 57 %
O2 Saturation: 59 %
Potassium: 3.9 mmol/L (ref 3.5–5.1)
Potassium: 4 mmol/L (ref 3.5–5.1)
Sodium: 143 mmol/L (ref 135–145)
Sodium: 145 mmol/L (ref 135–145)
TCO2: 31 mmol/L (ref 22–32)
TCO2: 31 mmol/L (ref 22–32)
pCO2, Ven: 51.3 mmHg (ref 44.0–60.0)
pCO2, Ven: 52.2 mmHg (ref 44.0–60.0)
pH, Ven: 7.366 (ref 7.250–7.430)
pH, Ven: 7.369 (ref 7.250–7.430)
pO2, Ven: 32 mmHg (ref 32.0–45.0)
pO2, Ven: 32 mmHg (ref 32.0–45.0)

## 2019-02-09 LAB — POCT I-STAT 7, (LYTES, BLD GAS, ICA,H+H)
Acid-Base Excess: 5 mmol/L — ABNORMAL HIGH (ref 0.0–2.0)
Bicarbonate: 30.1 mmol/L — ABNORMAL HIGH (ref 20.0–28.0)
Calcium, Ion: 1.18 mmol/L (ref 1.15–1.40)
HCT: 27 % — ABNORMAL LOW (ref 36.0–46.0)
Hemoglobin: 9.2 g/dL — ABNORMAL LOW (ref 12.0–15.0)
O2 Saturation: 99 %
Potassium: 4 mmol/L (ref 3.5–5.1)
Sodium: 142 mmol/L (ref 135–145)
TCO2: 32 mmol/L (ref 22–32)
pCO2 arterial: 47.6 mmHg (ref 32.0–48.0)
pH, Arterial: 7.41 (ref 7.350–7.450)
pO2, Arterial: 132 mmHg — ABNORMAL HIGH (ref 83.0–108.0)

## 2019-02-09 LAB — GLUCOSE, CAPILLARY
Glucose-Capillary: 147 mg/dL — ABNORMAL HIGH (ref 70–99)
Glucose-Capillary: 126 mg/dL — ABNORMAL HIGH (ref 70–99)
Glucose-Capillary: 148 mg/dL — ABNORMAL HIGH (ref 70–99)
Glucose-Capillary: 220 mg/dL — ABNORMAL HIGH (ref 70–99)

## 2019-02-09 LAB — BASIC METABOLIC PANEL
Anion gap: 11 (ref 5–15)
BUN: 26 mg/dL — ABNORMAL HIGH (ref 8–23)
CO2: 26 mmol/L (ref 22–32)
Calcium: 8.7 mg/dL — ABNORMAL LOW (ref 8.9–10.3)
Chloride: 104 mmol/L (ref 98–111)
Creatinine, Ser: 1.79 mg/dL — ABNORMAL HIGH (ref 0.44–1.00)
GFR calc Af Amer: 32 mL/min — ABNORMAL LOW (ref >60)
GFR calc non Af Amer: 27 mL/min — ABNORMAL LOW (ref >60)
Glucose, Bld: 169 mg/dL — ABNORMAL HIGH (ref 70–99)
Potassium: 3.9 mmol/L (ref 3.5–5.1)
Sodium: 141 mmol/L (ref 135–145)

## 2019-02-09 SURGERY — RIGHT HEART CATH AND CORONARY/GRAFT ANGIOGRAPHY
Anesthesia: LOCAL

## 2019-02-09 SURGERY — PACEMAKER IMPLANT

## 2019-02-09 MED ORDER — SODIUM CHLORIDE 0.9 % WEIGHT BASED INFUSION: 3.0000 mL/kg/h | INTRAVENOUS | Status: DC

## 2019-02-09 MED ORDER — FENTANYL CITRATE (PF) 100 MCG/2ML IJ SOLN
INTRAMUSCULAR | Status: AC
  Filled 2019-02-09: qty 2

## 2019-02-09 MED ORDER — ASPIRIN EC 81 MG PO TBEC
81.0000 mg | DELAYED_RELEASE_TABLET | Freq: Every day | ORAL | Status: DC
  Administered 2019-02-09 – 2019-02-14 (×6): 81 mg via ORAL
  Filled 2019-02-09 (×6): qty 1

## 2019-02-09 MED ORDER — SODIUM CHLORIDE 0.9 % WEIGHT BASED INFUSION
1.0000 mL/kg/h | INTRAVENOUS | Status: DC
  Administered 2019-02-09: 1 mL/kg/h via INTRAVENOUS

## 2019-02-09 MED ORDER — LIDOCAINE HCL (PF) 1 % IJ SOLN
INTRAMUSCULAR | Status: AC
  Filled 2019-02-09: qty 60

## 2019-02-09 MED ORDER — SODIUM CHLORIDE 0.9 % WEIGHT BASED INFUSION: 1.0000 mL/kg/h | INTRAVENOUS | Status: DC

## 2019-02-09 MED ORDER — SODIUM CHLORIDE 0.9% FLUSH
3.0000 mL | Freq: Two times a day (BID) | INTRAVENOUS | Status: DC
  Administered 2019-02-10 – 2019-02-13 (×4): 3 mL via INTRAVENOUS

## 2019-02-09 MED ORDER — ASPIRIN 81 MG PO CHEW: 81.0000 mg | CHEWABLE_TABLET | ORAL | Status: DC

## 2019-02-09 MED ORDER — SODIUM CHLORIDE 0.9 % IV SOLN: INTRAVENOUS | Status: DC

## 2019-02-09 MED ORDER — SODIUM CHLORIDE 0.9% FLUSH
3.0000 mL | INTRAVENOUS | Status: DC | PRN
  Administered 2019-02-11: 3 mL via INTRAVENOUS

## 2019-02-09 MED ORDER — VERAPAMIL HCL 2.5 MG/ML IV SOLN
INTRAVENOUS | Status: AC
  Filled 2019-02-09: qty 2

## 2019-02-09 MED ORDER — SODIUM CHLORIDE 0.9 % IV SOLN: INTRAVENOUS | Status: AC

## 2019-02-09 MED ORDER — SODIUM CHLORIDE 0.9 % IV SOLN
80.0000 mg | INTRAVENOUS | Status: AC
  Administered 2019-02-09: 80 mg
  Filled 2019-02-09: qty 2

## 2019-02-09 MED ORDER — ACETAMINOPHEN 500 MG PO TABS
500.0000 mg | ORAL_TABLET | ORAL | Status: DC | PRN
  Administered 2019-02-09: 500 mg via ORAL
  Administered 2019-02-10 – 2019-02-11 (×4): 1000 mg via ORAL
  Administered 2019-02-11: 650 mg via ORAL
  Administered 2019-02-11 – 2019-02-12 (×2): 1000 mg via ORAL
  Filled 2019-02-09 (×2): qty 2
  Filled 2019-02-09: qty 1
  Filled 2019-02-09 (×6): qty 2

## 2019-02-09 MED ORDER — SODIUM CHLORIDE 0.9% FLUSH
3.0000 mL | Freq: Two times a day (BID) | INTRAVENOUS | Status: DC
  Administered 2019-02-09 – 2019-02-12 (×7): 3 mL via INTRAVENOUS

## 2019-02-09 MED ORDER — HEPARIN (PORCINE) IN NACL 1000-0.9 UT/500ML-% IV SOLN
INTRAVENOUS | Status: AC
  Filled 2019-02-09: qty 500

## 2019-02-09 MED ORDER — IOHEXOL 350 MG/ML SOLN
INTRAVENOUS | Status: DC | PRN
  Administered 2019-02-09: 33 mL

## 2019-02-09 MED ORDER — MIDAZOLAM HCL 5 MG/5ML IJ SOLN
INTRAMUSCULAR | Status: AC
  Filled 2019-02-09: qty 5

## 2019-02-09 MED ORDER — LIDOCAINE HCL (PF) 1 % IJ SOLN
INTRAMUSCULAR | Status: DC | PRN
  Administered 2019-02-09 (×2): 2 mL

## 2019-02-09 MED ORDER — LABETALOL HCL 5 MG/ML IV SOLN: 10.0000 mg | INTRAVENOUS | Status: AC | PRN

## 2019-02-09 MED ORDER — CEFAZOLIN SODIUM-DEXTROSE 2-4 GM/100ML-% IV SOLN
INTRAVENOUS | Status: AC
  Filled 2019-02-09: qty 100

## 2019-02-09 MED ORDER — MIDAZOLAM HCL 2 MG/2ML IJ SOLN
INTRAMUSCULAR | Status: DC | PRN
  Administered 2019-02-09: 1 mg via INTRAVENOUS

## 2019-02-09 MED ORDER — VERAPAMIL HCL 2.5 MG/ML IV SOLN
INTRAVENOUS | Status: DC | PRN
  Administered 2019-02-09: 10 mL via INTRA_ARTERIAL

## 2019-02-09 MED ORDER — CEFAZOLIN SODIUM-DEXTROSE 2-4 GM/100ML-% IV SOLN
2.0000 g | INTRAVENOUS | Status: AC
  Administered 2019-02-09: 2 g via INTRAVENOUS
  Filled 2019-02-09: qty 100

## 2019-02-09 MED ORDER — CEFAZOLIN SODIUM-DEXTROSE 1-4 GM/50ML-% IV SOLN
1.0000 g | Freq: Four times a day (QID) | INTRAVENOUS | Status: AC
  Administered 2019-02-09 – 2019-02-10 (×2): 1 g via INTRAVENOUS
  Filled 2019-02-09 (×2): qty 50

## 2019-02-09 MED ORDER — SODIUM CHLORIDE 0.9% FLUSH: 3.0000 mL | INTRAVENOUS | Status: DC | PRN

## 2019-02-09 MED ORDER — SODIUM CHLORIDE 0.9 % IV SOLN
250.0000 mL | INTRAVENOUS | Status: DC | PRN
  Administered 2019-02-09: 250 mL via INTRAVENOUS

## 2019-02-09 MED ORDER — SODIUM CHLORIDE 0.9 % WEIGHT BASED INFUSION
3.0000 mL/kg/h | INTRAVENOUS | Status: AC
  Administered 2019-02-09: 3 mL/kg/h via INTRAVENOUS

## 2019-02-09 MED ORDER — LIDOCAINE HCL (PF) 1 % IJ SOLN
INTRAMUSCULAR | Status: AC
  Filled 2019-02-09: qty 30

## 2019-02-09 MED ORDER — HEPARIN SODIUM (PORCINE) 1000 UNIT/ML IJ SOLN
INTRAMUSCULAR | Status: AC
  Filled 2019-02-09: qty 1

## 2019-02-09 MED ORDER — MIDAZOLAM HCL 2 MG/2ML IJ SOLN
INTRAMUSCULAR | Status: AC
  Filled 2019-02-09: qty 2

## 2019-02-09 MED ORDER — SODIUM CHLORIDE 0.9 % IV SOLN
INTRAVENOUS | Status: AC
  Filled 2019-02-09: qty 2

## 2019-02-09 MED ORDER — LIDOCAINE HCL (PF) 1 % IJ SOLN
INTRAMUSCULAR | Status: DC | PRN
  Administered 2019-02-09: 60 mL

## 2019-02-09 MED ORDER — HEPARIN SODIUM (PORCINE) 1000 UNIT/ML IJ SOLN
INTRAMUSCULAR | Status: DC | PRN
  Administered 2019-02-09: 4000 [IU] via INTRAVENOUS

## 2019-02-09 MED ORDER — PAROXETINE HCL 30 MG PO TABS
30.0000 mg | ORAL_TABLET | Freq: Every day | ORAL | Status: DC
  Administered 2019-02-09 – 2019-02-14 (×6): 30 mg via ORAL
  Filled 2019-02-09 (×6): qty 1

## 2019-02-09 MED ORDER — HEPARIN (PORCINE) IN NACL 1000-0.9 UT/500ML-% IV SOLN
INTRAVENOUS | Status: AC
  Filled 2019-02-09: qty 1000

## 2019-02-09 MED ORDER — FENTANYL CITRATE (PF) 100 MCG/2ML IJ SOLN
INTRAMUSCULAR | Status: DC | PRN
  Administered 2019-02-09: 25 ug via INTRAVENOUS

## 2019-02-09 MED ORDER — HEPARIN (PORCINE) IN NACL 1000-0.9 UT/500ML-% IV SOLN
INTRAVENOUS | Status: DC | PRN
  Administered 2019-02-09 (×2): 500 mL

## 2019-02-09 MED ORDER — SODIUM CHLORIDE 0.9 % IV SOLN: 250.0000 mL | INTRAVENOUS | Status: DC | PRN

## 2019-02-09 MED ORDER — ATORVASTATIN CALCIUM 80 MG PO TABS
80.0000 mg | ORAL_TABLET | Freq: Every day | ORAL | Status: DC
  Administered 2019-02-09 – 2019-02-13 (×5): 80 mg via ORAL
  Filled 2019-02-09 (×5): qty 1

## 2019-02-09 SURGICAL SUPPLY — 10 items
CABLE SURGICAL S-101-97-12 (CABLE) ×2 IMPLANT
COVER DOME SNAP 22 D (MISCELLANEOUS) ×2 IMPLANT
IPG PACE AZUR XT DR MRI W1DR01 (Pacemaker) IMPLANT
LEAD CAPSURE NOVUS 45CM (Lead) ×1 IMPLANT
LEAD CAPSURE NOVUS 5076-52CM (Lead) ×1 IMPLANT
PACE AZURE XT DR MRI W1DR01 (Pacemaker) ×2 IMPLANT
PAD PRO RADIOLUCENT 2001M-C (PAD) ×2 IMPLANT
SHEATH 7FR PRELUDE SNAP 13 (SHEATH) ×2 IMPLANT
SHEATH CLASSIC 7F 25CM (SHEATH) ×1 IMPLANT
TRAY PACEMAKER INSERTION (PACKS) ×2 IMPLANT

## 2019-02-09 SURGICAL SUPPLY — 15 items
CATH 5FR JL3.5 JR4 ANG PIG MP (CATHETERS) ×1 IMPLANT
CATH BALLN WEDGE 5F 110CM (CATHETERS) ×1 IMPLANT
CATH EXPO 5F MPA-1 (CATHETERS) ×1 IMPLANT
CATH INFINITI 5 FR AR1 MOD (CATHETERS) ×1 IMPLANT
DEVICE RAD COMP TR BAND LRG (VASCULAR PRODUCTS) ×1 IMPLANT
GLIDESHEATH SLEND SS 6F .021 (SHEATH) ×1 IMPLANT
GUIDEWIRE INQWIRE 1.5J.035X260 (WIRE) IMPLANT
INQWIRE 1.5J .035X260CM (WIRE) ×2
KIT HEART LEFT (KITS) ×2 IMPLANT
KIT MICROPUNCTURE NIT STIFF (SHEATH) ×1 IMPLANT
PACK CARDIAC CATHETERIZATION (CUSTOM PROCEDURE TRAY) ×2 IMPLANT
SHEATH GLIDE SLENDER 4/5FR (SHEATH) ×1 IMPLANT
SHEATH PROBE COVER 6X72 (BAG) ×1 IMPLANT
TRANSDUCER W/STOPCOCK (MISCELLANEOUS) ×2 IMPLANT
TUBING CIL FLEX 10 FLL-RA (TUBING) ×2 IMPLANT

## 2019-02-09 NOTE — Progress Notes (Signed)
Subjective: HD# (Admit Date: 02/07/2019)  Patient was seen this morning on rounds. We spoke about patient's new arrhythmia, and the possibility of replacing her aortic valve.  We also spoke about contrast dye injection and the need for a fluid bolus for kidney protection. Patient with complaints of o/n headaches. Patient endorses fatigue and orthopnea. Patient voiced understanding.   Objective:  Vital signs in last 24 hours: Vitals:   02/08/19 1610 02/08/19 1931 02/09/19 0048 02/09/19 0439  BP:  (!) 153/57 (!) 127/43 (!) 137/51  Pulse:  (!) 53 (!) 50 (!) 46  Resp: 20 (!) $Remo'23 20 17  'kQMmh$ Temp:  97.7 F (36.5 C) 97.9 F (36.6 C) 97.8 F (36.6 C)  TempSrc:  Oral Oral Oral  SpO2: 100% 100%    Weight:    77.7 kg  Height:        Physical Exam: Physical Exam  Constitutional: She is oriented to person, place, and time. No distress.  HENT:  Head: Normocephalic and atraumatic.  Eyes: EOM are normal.  Cardiovascular: Intact distal pulses. Bradycardia present. Exam reveals no gallop and no friction rub.  Murmur heard. Pulmonary/Chest: No respiratory distress (but requiring 2 liters supplimental O2). Wheezes: bilateral lower lung fields. She has rales. She exhibits no tenderness.  Abdominal: Soft. Bowel sounds are normal. She exhibits no distension. There is no abdominal tenderness.  Musculoskeletal:        General: Edema present. No tenderness. Normal range of motion.     Cervical back: Normal range of motion.  Neurological: She is alert and oriented to person, place, and time.  Skin: Skin is warm and dry. She is not diaphoretic.       Pertinent labs/Imaging: CBC Latest Ref Rng & Units 02/07/2019 02/07/2019 09/01/2018  WBC 4.0 - 10.5 K/uL - 11.7(H) 7.6  Hemoglobin 12.0 - 15.0 g/dL 10.9(L) 11.0(L) 12.0  Hematocrit 36.0 - 46.0 % 32.0(L) 34.4(L) 36.8  Platelets 150 - 400 K/uL - 207 265    CMP Latest Ref Rng & Units 02/09/2019 02/08/2019 02/07/2019  Glucose 70 - 99 mg/dL 169(H) 143(H)  187(H)  BUN 8 - 23 mg/dL 26(H) 26(H) 34(H)  Creatinine 0.44 - 1.00 mg/dL 1.79(H) 1.58(H) 1.50(H)  Sodium 135 - 145 mmol/L 141 145 142  Potassium 3.5 - 5.1 mmol/L 3.9 4.1 4.4  Chloride 98 - 111 mmol/L 104 109 108  CO2 22 - 32 mmol/L 26 27 -  Calcium 8.9 - 10.3 mg/dL 8.7(L) 9.0 -  Total Protein 6.5 - 8.1 g/dL - - -  Total Bilirubin 0.3 - 1.2 mg/dL - - -  Alkaline Phos 38 - 126 U/L - - -  AST 15 - 41 U/L - - -  ALT 0 - 44 U/L - - -    Assessment/Plan:  Principal Problem:   Acute on chronic diastolic CHF (congestive heart failure) (HCC) Active Problems:   DM (diabetes mellitus) (HCC)   Hyperlipidemia   Aortic stenosis s/p Tissure AVR 2006   CAD (coronary artery disease)   Hypertension   CKD (chronic kidney disease), stage III   Hypokalemia   Bradycardia   Acute on chronic diastolic (congestive) heart failure [facility])    Patient Summary: Ms. Holecek is a 73 y.o. female with HFpEF s/p bioprosthetic AVR (2006), CAD s/p CAGB, CKD stage II, who was admitted to [facility] with CHF exacerbation with echo showing severe bioprosthetic aortic valve stenosis and mitral valve stenosis currently being managed by cardiology and CT surgery.   #HFpEF: #CAD s/p CABG #Severe bioprosthetic  aortic valve stenosis and mitral valve stenosis: - Furosemide q 12 hours ordered, single dose of 80 mg on the 14th and then 40 mg q 12 hours on the 15th. Cardiology will hold lasix at this point for cath. - Dry weight is is unclear, but was noted to be 179 lbs in 2019. This is likely not accurate as the patient is currently 171 lbs. We will need to assess a new dry weight. - Net output is -1 L since admission - Cardiology consulted for TAVR. EP will evaluate the patient for pacemaker placement prior to performing the procedure.   - Appreciate cardiology and cardiothoracic surgery consultations  #Thrid-degree heart block: - Intermittent heart block - Beta blocker stopped at this time due to high grade conduction  disease. - Pacer pads in place.   - Still bradycardic in the 50's - EP consulted for possible permanent pacemaker. She will likely need pacemaker placement prior to TAVR.  CKD Stage II: - Limit nephrotoxic drugs, patient is high risk for contrast induced nephropathy due to work up  DM:  - Continue SSI   #HLD: - Atorvastatin 80 mg restarted  - ASA 81 mg restarted  #Depression: - restarted paroxetine 30 mg   Diet: NPO IVF: NS 50 cc/hr VTE: enoxaparin  Code: full  Dispo: Anticipated discharge pending clinical improvement.   Marianna Payment, MD 02/09/2019, 7:05 AM Pager: [phone]

## 2019-02-09 NOTE — Progress Notes (Signed)
TR band is removed at this time. Insertion site is level 0. 

## 2019-02-09 NOTE — Interval H&P Note (Signed)
History and Physical Interval Note:  02/09/2019 10:30 AM  [name]  has presented today for surgery, with the diagnosis of pre tavr workup.  The various methods of treatment have been discussed with the patient and family. After consideration of risks, benefits and other options for treatment, the patient has consented to  Procedure(s): RIGHT/LEFT HEART CATH AND CORONARY/GRAFT ANGIOGRAPHY (N/A) as a surgical intervention.  The patient's history has been reviewed, patient examined, no change in status, stable for surgery.  I have reviewed the patient's chart and labs.  Questions were answered to the patient's satisfaction.     Sherren Mocha
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
## 2019-02-09 NOTE — Progress Notes (Addendum)
Progress Note  Patient Name: [name] Date of Encounter: 02/09/2019  Primary Cardiologist: [name], MD   Subjective   Patient is feeling OK this morning. No chest pain. SOB minimally improved. Still in Heart block. R/L Heart cath today.    Inpatient Medications    Scheduled Meds: . aspirin EC  81 mg Oral Daily  . atorvastatin  80 mg Oral q1800  . enoxaparin (LOVENOX) injection  30 mg Subcutaneous Q24H  . furosemide  40 mg Intravenous Q12H  . influenza vaccine adjuvanted  0.5 mL Intramuscular Tomorrow-1000  . insulin aspart  0-15 Units Subcutaneous TID WC  . insulin aspart  0-5 Units Subcutaneous QHS  . PARoxetine  30 mg Oral Daily  . sodium chloride flush  3 mL Intravenous Q12H   Continuous Infusions: . sodium chloride     PRN Meds: sodium chloride, acetaminophen, hydrALAZINE, ondansetron (ZOFRAN) IV, sodium chloride flush   Vital Signs    Vitals:   02/08/19 1610 02/08/19 1931 02/09/19 0048 02/09/19 0439  BP:  (!) 153/57 (!) 127/43 (!) 137/51  Pulse:  (!) 53 (!) 50 (!) 46  Resp: 20 (!) $Remo'23 20 17  'RqZxr$ Temp:  97.7 F (36.5 C) 97.9 F (36.6 C) 97.8 F (36.6 C)  TempSrc:  Oral Oral Oral  SpO2: 100% 100%    Weight:    77.7 kg  Height:        Intake/Output Summary (Last 24 hours) at 02/09/2019 0750 Last data filed at 02/09/2019 0450 Gross per 24 hour  Intake 1078 ml  Output 2200 ml  Net -1122 ml   Last 3 Weights 02/09/2019 02/08/2019 10/21/2018  Weight (lbs) 171 lb 4.8 oz 166 lb 14.2 oz 163 lb 3.2 oz  Weight (kg) 77.7 kg 75.7 kg 74.027 kg      Telemetry    Complete Heart Block, HR 40s - Personally Reviewed  ECG    No new - Personally Reviewed  Physical Exam   GEN: No acute distress.   Neck: No JVD Cardiac: RRR, systolic murmur, rubs, or gallops.  Respiratory: Clear to auscultation bilaterally. GI: Soft, nontender, non-distended  MS: No edema; No deformity. Neuro:  Nonfocal  Psych: Normal affect   Labs    High Sensitivity  Troponin:   Recent Labs  Lab 02/07/19 1323 02/07/19 1626  TROPONINIHS 24* 26*      Chemistry Recent Labs  Lab 02/07/19 1323 02/07/19 1527 02/08/19 0556 02/09/19 0508  NA 143 142 145 141  K 3.3* 4.4 4.1 3.9  CL 110 108 109 104  CO2 23  --  27 26  GLUCOSE 203* 187* 143* 169*  BUN 25* 34* 26* 26*  CREATININE 1.48* 1.50* 1.58* 1.79*  CALCIUM 8.2*  --  9.0 8.7*  PROT 6.5  --   --   --   ALBUMIN 3.1*  --   --   --   AST 13*  --   --   --   ALT 12  --   --   --   ALKPHOS 96  --   --   --   BILITOT 1.0  --   --   --   GFRNONAA 34*  --  32* 27*  GFRAA 40*  --  37* 32*  ANIONGAP 10  --  9 11     Hematology Recent Labs  Lab 02/07/19 1323 02/07/19 1527  WBC 11.7*  --   RBC 3.60*  --   HGB 11.0* 10.9*  HCT 34.4* 32.0*  MCV 95.6  --   MCH 30.6  --   MCHC 32.0  --   RDW 14.0  --   PLT 207  --     BNP Recent Labs  Lab 02/07/19 1323  BNP 2,131.7*     DDimer No results for input(s): DDIMER in the last 168 hours.   Radiology    DG Chest Port 1 View  Result Date: 02/07/2019 CLINICAL DATA:  Shortness of breath EXAM: PORTABLE CHEST 1 VIEW COMPARISON:  07/10/2018 FINDINGS: Prior CABG. Borderline heart size. Linear densities in the lung bases. No effusions or confluent opacities. No acute bony abnormality. IMPRESSION: Linear densities in the lung bases could reflect atelectasis. Borderline heart size. Electronically Signed   By: Rolm Baptise M.D.   On: 02/07/2019 14:22   ECHOCARDIOGRAM COMPLETE  Result Date: 02/08/2019   ECHOCARDIOGRAM REPORT   Patient Name:   [name] Date of Exam: 02/08/2019 Medical Rec #:  [id number]        Height:       60.0 in Accession #:    [id number]       Weight:       166.9 lb Date of Birth:  [birth year]        BSA:          1.73 m Patient Age:    73 years         BP:           149/53 mmHg Patient Gender: F                HR:           45 bpm. Exam Location:  Inpatient Procedure: 2D Echo, Color Doppler and Cardiac Doppler Indications:     I35.0 Nonrheumatic aortic (valve) stenosis; I50.31 Acute                 diastolic (congestive) heart failure  History:        Patient has prior history of Echocardiogram examinations, most                 recent 06/25/2017. CHF, CAD; Risk Factors:Hypertension, Diabetes                 and Dyslipidemia. Aortic Valve: A porcine aortic valve                 bioprosthesis was implanted 06/2004. Covid+ on 07/10/18.  Sonographer:    Raquel Sarna Senior RDCS Referring Phys: [id number] Elouise Munroe  Sonographer Comments: Image acquisition challenging due to respiratory motion. IMPRESSIONS  1. Left ventricular ejection fraction, by visual estimation, is 70 to 75%. The left ventricle has hyperdynamic function. There is moderately increased left ventricular hypertrophy.  2. Abnormal septal motion consistent with post-operative status.  3. Elevated left atrial pressure.  4. The left ventricle has no regional wall motion abnormalities.  5. Global right ventricle has normal systolic function.The right ventricular size is normal. No increase in right ventricular wall thickness.  6. Left atrial size was severely dilated.  7. Right atrial size was mildly dilated.  8. Severe mitral annular calcification.  9. The mitral valve is degenerative. Mild mitral valve regurgitation. Moderate-severe mitral stenosis. 10. MV mean gradient, 10 mmHg at 45 bpm. Calculated area is 1.4 cm sq by PHT and 1.1 cm sq by continuity equation. 11. Severe aortic valve bioprosthesis stenosis. Mean gradient is 55 mm Hg, dimensionless obstructive index 0.24. 12. Aortic valve regurgitation is mild. 13. The tricuspid valve is  normal in structure. Tricuspid valve regurgitation is mild. 14. Pulmonic regurgitation is mild. 15. The pulmonic valve was grossly normal. Pulmonic valve regurgitation is mild. 16. Severely elevated pulmonary artery systolic pressure. 17. The tricuspid regurgitant velocity is 3.97 m/s, and with an assumed right atrial pressure of 15 mmHg, the  estimated right ventricular systolic pressure is severely elevated at 78.0 mmHg. 18. The inferior vena cava is dilated in size with <50% respiratory variability, suggesting right atrial pressure of 15 mmHg. FINDINGS  Left Ventricle: Left ventricular ejection fraction, by visual estimation, is 70 to 75%. The left ventricle has hyperdynamic function. The left ventricle has no regional wall motion abnormalities. The left ventricular internal cavity size was the left ventricle is normal in size. There is moderately increased left ventricular hypertrophy. Concentric left ventricular hypertrophy. Abnormal (paradoxical) septal motion consistent with post-operative status. The left ventricular diastology could not be evaluated due to mitral stenosis. Elevated left atrial pressure. Right Ventricle: The right ventricular size is normal. No increase in right ventricular wall thickness. Global RV systolic function is has normal systolic function. The tricuspid regurgitant velocity is 3.97 m/s, and with an assumed right atrial pressure  of 15 mmHg, the estimated right ventricular systolic pressure is severely elevated at 78.0 mmHg. Left Atrium: Left atrial size was severely dilated. Right Atrium: Right atrial size was mildly dilated Pericardium: There is no evidence of pericardial effusion. Mitral Valve: The mitral valve is degenerative in appearance. Severe mitral annular calcification. Mild mitral valve regurgitation. Moderate-severe mitral valve stenosis by observation. MV peak gradient, 34.8 mmHg. (peak gradient is exaggerated by asynchronous atrial contraction). Mean gradient is 10 mm Hg, despite bradycardia 45 bpm. Tricuspid Valve: The tricuspid valve is normal in structure. Tricuspid valve regurgitation is mild. Aortic Valve: The aortic valve has been repaired/replaced. Aortic valve regurgitation is mild. Severe aortic stenosis is present. Aortic valve mean gradient measures 55.5 mmHg. Aortic valve peak gradient measures  89.1 mmHg. Aortic valve area, by VTI measures 0.77 cm. Porcine aortic valve bioprosthesis valve is present in the aortic position. Procedure Date: 06/2004. Pulmonic Valve: The pulmonic valve was grossly normal. Pulmonic valve regurgitation is mild. Pulmonic regurgitation is mild. Aorta: The aortic root and ascending aorta are structurally normal, with no evidence of dilitation. Venous: The inferior vena cava is dilated in size with less than 50% respiratory variability, suggesting right atrial pressure of 15 mmHg. IAS/Shunts: No atrial level shunt detected by color flow Doppler.  LEFT VENTRICLE PLAX 2D LVIDd:         3.90 cm  Diastology LVIDs:         2.20 cm  LV e' lateral:   6.73 cm/s LV PW:         1.50 cm  LV E/e' lateral: 34.8 LV IVS:        1.70 cm  LV e' medial:    4.87 cm/s LVOT diam:     2.03 cm  LV E/e' medial:  48.0 LV SV:         50 ml LV SV Index:   27.26 LVOT Area:     3.24 cm  RIGHT VENTRICLE RV S prime:     9.08 cm/s TAPSE (M-mode): 1.9 cm LEFT ATRIUM             Index       RIGHT ATRIUM           Index LA diam:        3.90 cm 2.26 cm/m  RA Area:  18.00 cm LA Vol (A2C):   89.2 ml 51.61 ml/m RA Volume:   50.20 ml  29.04 ml/m LA Vol (A4C):   75.0 ml 43.39 ml/m LA Biplane Vol: 92.1 ml 53.29 ml/m  AORTIC VALVE AV Area (Vmax):    0.68 cm AV Area (Vmean):   0.69 cm AV Area (VTI):     0.77 cm AV Vmax:           472.00 cm/s AV Vmean:          348.000 cm/s AV VTI:            1.190 m AV Peak Grad:      89.1 mmHg AV Mean Grad:      55.5 mmHg LVOT Vmax:         98.50 cm/s LVOT Vmean:        74.500 cm/s LVOT VTI:          0.281 m LVOT/AV VTI ratio: 0.24  AORTA Ao Root diam: 2.80 cm Ao Asc diam:  3.30 cm MITRAL VALVE                         TRICUSPID VALVE MV Area (PHT): 1.44 cm              TR Peak grad:   63.0 mmHg MV Peak grad:  34.8 mmHg             TR Vmax:        397.00 cm/s MV Mean grad:  10.0 mmHg MV Vmax:       2.95 m/s              SHUNTS MV Vmean:      142.0 cm/s            Systemic VTI:   0.28 m MV VTI:        0.79 m                Systemic Diam: 2.03 cm MV PHT:        153.25 msec MV Decel Time: 528 msec MR PISA: 0.61 cm MV E velocity: 234.00 cm/s 103 cm/s MV A velocity: 188.00 cm/s 70.3 cm/s MV E/A ratio:  1.24        1.5  Mihai Croitoru MD Electronically signed by Sanda Klein MD Signature Date/Time: 02/08/2019/10:49:20 AM    Final     Cardiac Studies   Echo 02/08/19  1. Left ventricular ejection fraction, by visual estimation, is 70 to 75%. The left ventricle has hyperdynamic function. There is moderately increased left ventricular hypertrophy.  2. Abnormal septal motion consistent with post-operative status.  3. Elevated left atrial pressure.  4. The left ventricle has no regional wall motion abnormalities.  5. Global right ventricle has normal systolic function.The right ventricular size is normal. No increase in right ventricular wall thickness.  6. Left atrial size was severely dilated.  7. Right atrial size was mildly dilated.  8. Severe mitral annular calcification.  9. The mitral valve is degenerative. Mild mitral valve regurgitation. Moderate-severe mitral stenosis. 10. MV mean gradient, 10 mmHg at 45 bpm. Calculated area is 1.4 cm sq by PHT and 1.1 cm sq by continuity equation. 11. Severe aortic valve bioprosthesis stenosis. Mean gradient is 55 mm Hg, dimensionless obstructive index 0.24. 12. Aortic valve regurgitation is mild. 13. The tricuspid valve is normal in structure. Tricuspid valve regurgitation is mild. 14. Pulmonic regurgitation is mild. 15. The pulmonic valve was grossly  normal. Pulmonic valve regurgitation is mild. 16. Severely elevated pulmonary artery systolic pressure. 17. The tricuspid regurgitant velocity is 3.97 m/s, and with an assumed right atrial pressure of 15 mmHg, the estimated right ventricular systolic pressure is severely elevated at 78.0 mmHg. 18. The inferior vena cava is dilated in size with <50% respiratory variability, suggesting  right atrial pressure of 15 mmHg.  Patient Profile     73 y.o. female  with a histroy of CAD s/p CABG (SVG - RCA) with AVR (1610) course complicated by CVA (presumed embolic), CKD stage 2, IDDM, HLD, HTN, chronic diastolic heart failure, and hx of breast cancer who is being treated for Acute CHF and complete Heart block.   Assessment & Plan    SOB/Acute on Chronic diastolic Heart Failure - BNP > 2000 and CXR with atelectasis - Was on lasix 40 mg at home - Patient started on IV Lasix 40 mg - Echo this admission showed EF 70-75%, moderate LV, elevated left atrail pressure, biatrial enlargement, mild MR, moderate-severe MS, severe aortic valve bioprosthesis stenosis - I/Os -1L since admission - Weights not accurate - creatinine continues to increase 1.5 > 1.58 > 1.79.  - Patient appears euvolemic on exam  S/P AVR (tissue valve, 2006) - Echo this admission showed multivalvular disease, severe AS, moderate to severe MS, mild MR - Patient was seen by Structural Heart team and recommend EP consult for CHB before proceeding with a procedure - R/L heart cath today  Bradycardia/CHB/LBBB - BB stopped - Patient is still in CHB with rates in the 40s - EP consult today for PPM  CKD stage II - creatinine 1.79 today - potassium stable  CAD s/p CABG x 1 - TAVR planning cath today.   DM2 - A1C 6.8 - per IM   For questions or updates, please contact [place] Please consult [email or web address] for contact info under        Signed, Cadence Ninfa Meeker, PA-C  02/09/2019, 7:51 AM    ---------------------------------------------------------------------------------------------   History and all data above reviewed.  Patient examined.  I agree with the findings as above.  Thurnell Lose feels well and has no acute concerns. She reminds me that she does not take blood as a Jehovah's witness.  Her headache is improved after removal of the Nitropatch, however she tells me she did have a  mild headache overnight.  Constitutional: No acute distress Cardiovascular: 3-6 late peaking systolic ejection murmur heard best at the right upper sternal border.  Quiet S1.  S2 audible.  No diastolic murmur heard Respiratory: clear to auscultation bilaterally GI : normal bowel sounds, soft and nontender. No distention.   MSK: extremities warm, well perfused.  Trace edema.  NEURO: grossly nonfocal exam, moves all extremities. PSYCH: alert and oriented x 3, normal mood and affect.   All available labs, radiology testing, previous records reviewed. Agree with documented assessment and plan of my colleague as stated above with the following additions or changes:  Principal Problem:   Acute on chronic diastolic CHF (congestive heart failure) ([place]) Active Problems:   DM (diabetes mellitus) ([place])   Hyperlipidemia   Aortic stenosis s/p Tissure AVR 2006   CAD (coronary artery disease)   Hypertension   CKD (chronic kidney disease), stage III   Hypokalemia   Bradycardia   Acute on chronic diastolic (congestive) heart failure ([place])    Plan: I have discussed Ms. Dunbar's care in a multidisciplinary fashion with electrophysiology and structural cardiology.  She is undergoing an  evaluation for permanent pacemaker implantation for complete heart block as well as evaluation for valve in valve TAVR.  Her risk factors are somewhat prohibitive for surgical approach to valve replacement, however we will continue to discuss this in a multidisciplinary fashion.  We will plan for diagnostic coronary angiogram today, as well as potential permanent pacemaker implantation.  We discussed the risk, benefits, alternatives of coronary angiography plus minus PCI.  We discussed indications for coronary angiography plus minus PCI in the setting of pre-TAVR planning.  Given her impaired renal function we will use limited contrast for angiography.  With her valve disease and heart failure, minimize fluids.  We will  reassess volume status after cath today, it is likely she will need continued diuresis, however her renal function is worse today.    INFORMED CONSENT: I have reviewed the risks, indications, and alternatives to cardiac catheterization, possible angioplasty, and stenting with the patient. Risks include but are not limited to bleeding, infection, vascular injury, stroke, myocardial infection, arrhythmia, kidney injury, radiation-related injury in the case of prolonged fluoroscopy use, emergency cardiac surgery, and death. The patient understands the risks of serious complication is 1-2 in 7858 with diagnostic cardiac cath and 1-2% or less with angioplasty/stenting.    Time Spent Directly with Patient:  I have spent a total of 35 minutes with the patient reviewing hospital notes, telemetry, EKGs, labs and examining the patient as well as establishing an assessment and plan that was discussed personally with the patient.  > 50% of time was spent in direct patient care.  Length of Stay:  LOS: 2 days   Elouise Munroe, MD [facility] 9:35 AM  02/09/2019

## 2019-02-09 NOTE — Progress Notes (Signed)
Internal Medicine Attending Note:  I have seen and evaluated this patient and I have discussed the plan of care with the house staff. Please see their note for complete details. I concur with their findings.  Patient is a 73 yo F with a pmhx of CAD and aortic stenosis s/p AVR and CABG x 1 (SVG -> RCA) in 2006, here with bradycardia, complete heart block, and critical stenosis of her bioprosthetic aortic valve. She is now s/p R&LHC today which showed patency of her saphenous vein graft and mild pulmonary hypertension with mean PA pressure of 26. Plan now is for the multidisciplinary heart valve team to review her case. EP is on board and planning pacemaker implantation today for her heart block. Greatly appreciate cardiology's assistance with this case. Will continue to closely monitor her kidney function.   Velna Ochs, MD 02/09/2019, 1:20 PM

## 2019-02-09 NOTE — H&P (Signed)
[name] has presented today for surgery, with the diagnosis of complete AV block.  The various methods of treatment have been discussed with the patient and family. After consideration of risks, benefits and other options for treatment, the patient has consented to  Procedure(s): Pacemaker implant as a surgical intervention .  Risks include but not limited to bleeding, tamponade, infection, pneumothorax, among others. The patient's history has been reviewed, patient examined, no change in status, stable for surgery.  I have reviewed the patient's chart and labs.  Questions were answered to the patient's satisfaction.    Layci Stenglein Curt Bears, MD 02/09/2019 1:05 PM

## 2019-02-09 NOTE — Progress Notes (Signed)
Carotid duplex has been completed.   Preliminary results in CV Proc.   Abram Sander 02/09/2019 9:31 AM

## 2019-02-10 ENCOUNTER — Inpatient Hospital Stay (HOSPITAL_COMMUNITY): Payer: Medicare Other

## 2019-02-10 DIAGNOSIS — I130 Hypertensive heart and chronic kidney disease with heart failure and stage 1 through stage 4 chronic kidney disease, or unspecified chronic kidney disease: Principal | ICD-10-CM

## 2019-02-10 DIAGNOSIS — I2510 Atherosclerotic heart disease of native coronary artery without angina pectoris: Secondary | ICD-10-CM

## 2019-02-10 DIAGNOSIS — I350 Nonrheumatic aortic (valve) stenosis: Secondary | ICD-10-CM

## 2019-02-10 LAB — CBC
HCT: 29.1 % — ABNORMAL LOW (ref 36.0–46.0)
Hemoglobin: 9.5 g/dL — ABNORMAL LOW (ref 12.0–15.0)
MCH: 30.9 pg (ref 26.0–34.0)
MCHC: 32.6 g/dL (ref 30.0–36.0)
MCV: 94.8 fL (ref 80.0–100.0)
Platelets: 187 10*3/uL (ref 150–400)
RBC: 3.07 MIL/uL — ABNORMAL LOW (ref 3.87–5.11)
RDW: 13.6 % (ref 11.5–15.5)
WBC: 6.6 10*3/uL (ref 4.0–10.5)
nRBC: 0 % (ref 0.0–0.2)

## 2019-02-10 LAB — BASIC METABOLIC PANEL
Anion gap: 11 (ref 5–15)
BUN: 21 mg/dL (ref 8–23)
CO2: 28 mmol/L (ref 22–32)
Calcium: 8.4 mg/dL — ABNORMAL LOW (ref 8.9–10.3)
Chloride: 104 mmol/L (ref 98–111)
Creatinine, Ser: 1.35 mg/dL — ABNORMAL HIGH (ref 0.44–1.00)
GFR calc Af Amer: 44 mL/min — ABNORMAL LOW (ref >60)
GFR calc non Af Amer: 38 mL/min — ABNORMAL LOW (ref >60)
Glucose, Bld: 156 mg/dL — ABNORMAL HIGH (ref 70–99)
Potassium: 3.8 mmol/L (ref 3.5–5.1)
Sodium: 143 mmol/L (ref 135–145)

## 2019-02-10 LAB — GLUCOSE, CAPILLARY
Glucose-Capillary: 133 mg/dL — ABNORMAL HIGH (ref 70–99)
Glucose-Capillary: 207 mg/dL — ABNORMAL HIGH (ref 70–99)
Glucose-Capillary: 195 mg/dL — ABNORMAL HIGH (ref 70–99)
Glucose-Capillary: 207 mg/dL — ABNORMAL HIGH (ref 70–99)

## 2019-02-10 MED ORDER — METOPROLOL TARTRATE 50 MG PO TABS
50.0000 mg | ORAL_TABLET | ORAL | Status: AC
  Administered 2019-02-10: 50 mg via ORAL
  Filled 2019-02-10: qty 1

## 2019-02-10 MED ORDER — POLYETHYLENE GLYCOL 3350 17 G PO PACK
17.0000 g | PACK | Freq: Every day | ORAL | Status: DC
  Administered 2019-02-10 – 2019-02-11 (×2): 17 g via ORAL
  Filled 2019-02-10 (×2): qty 1

## 2019-02-10 MED ORDER — SENNOSIDES-DOCUSATE SODIUM 8.6-50 MG PO TABS: 1.0000 | ORAL_TABLET | Freq: Every evening | ORAL | Status: DC | PRN

## 2019-02-10 MED ORDER — IOHEXOL 350 MG/ML SOLN
100.0000 mL | Freq: Once | INTRAVENOUS | Status: AC | PRN
  Administered 2019-02-10: 100 mL via INTRAVENOUS

## 2019-02-10 MED FILL — Midazolam HCl Inj 5 MG/5ML (Base Equivalent): INTRAMUSCULAR | Qty: 5 | Status: AC

## 2019-02-10 MED FILL — Fentanyl Citrate Preservative Free (PF) Inj 100 MCG/2ML: INTRAMUSCULAR | Qty: 2 | Status: AC

## 2019-02-10 MED FILL — Heparin Sod (Porcine)-NaCl IV Soln 1000 Unit/500ML-0.9%: INTRAVENOUS | Qty: 500 | Status: AC

## 2019-02-10 NOTE — Progress Notes (Signed)
Inpatient Rehabilitation Admissions Coordinator  Inpatient rehab consult received per therapy recommendations. Patient max assist to transfer. I will not have an inpt rehab bed available for this patient to admit to CIR this week. I will follow her progress while remains in hospital. Noted plan TAVR 03/01/2019.  Danne Baxter, RN, MSN Rehab Admissions Coordinator [phone] 02/10/2019 8:27 PM

## 2019-02-10 NOTE — Consult Note (Signed)
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  *Not included in total MAR Hold  sodium chloride flush (NS) 0.9 % injection 3 mL (mL) Total dose: Cannot be calculated* Dosing weight: 77.7  *Administration dose not documented  Date/Time   Rate/Dose/Volume Action  02/09/19 1013  *Not included in total MAR Hold  acetaminophen (TYLENOL) tablet 650 mg (mg) Total dose: Cannot be calculated*  *Administration dose not documented  Date/Time   Rate/Dose/Volume Action  02/09/19 1013  *Not included in total MAR Hold     Sedation Time Sedation Time Physician-1: 32 minutes 51 seconds        Contrast Medication Name Total Dose  iohexol (OMNIPAQUE) 350 MG/ML injection 33 mL     Radiation/Fluoro Fluoro time:  9.7 (min)  DAP: 9.9 (Gycm2)  Cumulative Air Kerma: 196.5 (mGy)        Coronary  Findings Diagnostic Dominance: Right  Left Main  Vessel is large. There is mild diffuse disease throughout the vessel.   Left Anterior Descending  There is mild diffuse disease throughout the vessel.   Left Circumflex  There is mild diffuse disease throughout the vessel.   Right Coronary Artery  Prox RCA to Mid RCA lesion 100% stenosed  Prox RCA to Mid RCA lesion is 100% stenosed.   Saphenous Graft To RPDA  SVG and is normal in caliber. The graft exhibits no disease. SVG-PDA patent  Intervention No interventions have been documented.                            Coronary Diagrams Diagnostic Dominance: Right  &&&&&  Intervention      Implants  No implant documentation for this case.      Syngo Images Link to Procedure Log  Show images for CARDIAC CATHETERIZATION Procedure Log     Images on Long Term Storage   Show images for Sansa, Alkema         Hemo Data   Most Recent Value  Fick Cardiac Output 3.8 L/min  Fick Cardiac Output Index 2.17 (L/min)/BSA  RA A Wave 15 mmHg  RA V Wave 14 mmHg  RA Mean 12 mmHg  RV Systolic Pressure 50 mmHg  RV Diastolic Pressure 4 mmHg  RV EDP 11 mmHg  PA Systolic Pressure 51 mmHg  PA Diastolic Pressure 5 mmHg  PA Mean 26 mmHg  PW A Wave 25 mmHg  PW V Wave 29 mmHg  PW Mean 25 mmHg  AO Systolic Pressure 194 mmHg  AO Diastolic Pressure 44 mmHg  AO Mean 65 mmHg  QP/QS 1  TPVR Index 11.98 HRUI  TSVR Index 29.94 HRUI  PVR SVR Ratio 0.02  TPVR/TSVR Ratio 0.4    ADDENDUM REPORT: 02/10/2019 16:53  CLINICAL DATA:  Bioprosthetic aortic valve stenosis  EXAM: Cardiac TAVR CT  TECHNIQUE: The patient was scanned on a Siemens Force 174 slice scanner. A 120 kV retrospective scan was triggered in the descending thoracic aorta at 111 HU's. Gantry rotation speed was 270 msecs and collimation was .9 mm. No beta blockade or nitro were given. The 3D data set was reconstructed in 5% intervals of the R-R cycle.  Systolic and diastolic phases were analyzed on a dedicated work station using MPR, MIP and VRT modes. The patient received 121mL OMNIPAQUE IOHEXOL 350 MG/ML SOLN of contrast.  FINDINGS: Aortic Valve: Bioprosthetic aortic valve. 21 mm Magna Ease pericardial prosthesis, 2006. Moderately restricted leaflet motion with moderately calcified leaflets.  Valve measures 20 x 20 mm. Moderately calcified leaflets. Posterior leaflet of the valve has restricted motion.  No LVOT calcifications.  Aorta: Mild calcifications in the thoracic aorta. Normal 3 vessel branch pattern of the aortic arch.  Sinotubular Junction: 28 mm  Ascending Thoracic Aorta: 37 mm  Aortic Arch: 32 mm  Descending Thoracic Aorta: 25 mm  Coronary Artery Height above Annulus:  Left Main: 8.9 mm  Right Coronary: 2.7 mm  The leaflet of the bioprosthesis adjacent to the left main coronary origin has restricted motion. The left coronary sinus is large, and coronary ostium distance is adequate. The leaflet is unlikely to obstruct the coronary ostium.  The right coronary artery arises low on the right coronary sinus. It is adjacent to the struts  of the bioprosthetic valve. It is known to be occluded and there is a patent SVG-RCA graft seen on this exam.  Prosthesis to LM distance - 67mm  Prosthesis to RCA distance - 2 mm. The RCA is known to be occluded and there is a patent SVG-RCA graft noted.  Coronary Arteries: Three vessel coronary artery calcifications. SVG to RCA graft seen arising from mid ascending aorta.  For valve in valve TAVR, this valve would be suited for a 23 mm Evolut Core Valve.  OTHER:  Severe mitral annular calcifications.  No left atrial appendage thrombus.  Normal pulmonary vein anatomy.  IMPRESSION: 1. 21 mm Magna Ease bioprosthetic aortic valve with severe stenosis. Moderately reduced leaflet excursion with moderately calcified leaflets.  2.  Normal  ascending aorta dimensions.  3. Adequate left coronary height and distance from prosthesis for placement of a 23 mm Evolut Core Valve in a valve in valve fashion.  4.  Severe mitral annular calcification.   Electronically Signed   By: Cherlynn Kaiser   On: 02/10/2019 16:53   CLINICAL DATA:  Inpatient. Aortic valve replacement and CABG 2006. Severe symptomatic aortic stenosis. Normal EF. Pre-TAVR evaluation. Remote history of left breast cancer.  EXAM: CT ANGIOGRAPHY CHEST, ABDOMEN AND PELVIS  TECHNIQUE: Multidetector CT imaging through the chest, abdomen and pelvis was performed using the standard protocol during bolus administration of intravenous contrast. Multiplanar reconstructed images and MIPs were obtained and reviewed to evaluate the vascular anatomy.  CONTRAST:  154mL OMNIPAQUE IOHEXOL 350 MG/ML SOLN  COMPARISON:  07/08/2004 chest and abdomen CT angiogram.  FINDINGS: CTA CHEST FINDINGS  Cardiovascular: Mild cardiomegaly. Right subclavian 2 lead pacemaker with lead tips in the right atrium and interventricular septum. Mild subcutaneous emphysema surrounding the pacemaker device in the ventral upper right chest wall, compatible with recent device placement. No fluid collections. No significant pericardial effusion/thickening. Left main and 3 vessel coronary atherosclerosis status post CABG. Aortic valve prosthesis in place. Atherosclerotic nonaneurysmal thoracic aorta. Top-normal caliber main pulmonary artery (3.2 cm diameter). No central pulmonary emboli.  Mediastinum/Nodes: No discrete thyroid nodules. Unremarkable esophagus. No pathologically enlarged axillary, mediastinal or hilar lymph nodes. Surgical clips noted in the left axilla.  Lungs/Pleura: No pneumothorax. No pleural effusion. No acute consolidative airspace disease, lung masses or significant pulmonary nodules. Mosaic attenuation throughout both lungs.  Musculoskeletal: No  aggressive appearing focal osseous lesions. Intact sternotomy wires. Moderate thoracic spondylosis. Moderate T11 vertebral compression fracture, chronic and stable since 06/22/2017 thoracic spine radiographs.  CTA ABDOMEN AND PELVIS FINDINGS  Hepatobiliary: Normal liver with no liver mass. Cholecystectomy. Bile ducts are within normal post cholecystectomy limits with CBD diameter 7 mm.  Pancreas: Normal, with no mass or duct dilation.  Spleen: Normal size. No mass.  Adrenals/Urinary Tract: Normal adrenals. Simple 4.8 cm lower right renal cyst. No additional contour deforming renal lesions. No hydronephrosis. Normal bladder.  Stomach/Bowel: Small hiatal hernia. Otherwise normal stomach. Normal caliber small bowel with no small bowel wall thickening. Normal appendix. Mild sigmoid diverticulosis, with no large bowel wall thickening or significant pericolonic fat stranding.  Vascular/Lymphatic: Atherosclerotic nonaneurysmal abdominal aorta. Patent splenic and renal veins. No pathologically enlarged lymph nodes in the abdomen or pelvis.  Reproductive: Status post hysterectomy, with no abnormal findings at the vaginal cuff. No adnexal mass.  Other: No pneumoperitoneum, ascites or focal fluid collection.  Musculoskeletal: No aggressive appearing focal osseous lesions. Moderate lumbar spondylosis.  VASCULAR MEASUREMENTS PERTINENT TO TAVR:  AORTA:  Minimal Aortic Diameter-14.0 x 13.8 mm  Severity of  Aortic Calcification-moderate  RIGHT PELVIS:  Right Common Iliac Artery -  Minimal Diameter-8.8 x 8.6 mm  Tortuosity-moderate  Calcification-moderate  Right External Iliac Artery -  Minimal Diameter-7.7 x 7.1 mm  Tortuosity-mild  Calcification-mild  Right Common Femoral Artery -  Minimal Diameter-7.7 x 7.3 mm  Tortuosity-mild  Calcification-mild  LEFT PELVIS:  Left Common Iliac Artery -  Minimal Diameter-9.6 x 9.2  mm  Tortuosity-moderate  Calcification-moderate  Left External Iliac Artery -  Minimal Diameter-6.9 x 6.8 mm  Tortuosity-mild  Calcification-mild  Left Common Femoral Artery -  Minimal Diameter-7.4 x 6.7 mm  Tortuosity-mild  Calcification-mild  Review of the MIP images confirms the above findings.  IMPRESSION: 1. Vascular findings and measurements pertinent to potential TAVR procedure, as detailed. 2. Mild cardiomegaly. Left main and 3 vessel coronary atherosclerosis status post CABG. 3. Mosaic attenuation throughout both lungs, nonspecific, which could be due to mosaic perfusion from pulmonary vascular disease versus air trapping from small airways disease. 4. Small hiatal hernia. 5. Mild sigmoid diverticulosis. 6.  Aortic Atherosclerosis (ICD10-I70.0).   Electronically Signed   By: Ilona Sorrel M.D.   On: 02/10/2019 16:51   STS Risk Calculator: Procedure: AV Replacement  Procedure: Isolated AVR Risk of Mortality: 10.295% Renal Failure: 22.442% Permanent Stroke: 5.507% Prolonged Ventilation: 42.619% DSW Infection: 0.335% Reoperation: 7.875% Morbidity or Mortality: 47.062% Short Length of Stay: 6.867% Long Length of Stay: 24.419%   Impression:  This 73 year old woman has stage D, severe, symptomatic prosthetic aortic valve stenosis and was admitted with New York Heart Association class IV symptoms of exertional shortness of breath and fatigue as well as orthopnea and had a BNP of 2131 on admission consistent with acute on chronic diastolic congestive heart failure.  He was also noted to be in complete heart block with marked bradycardia on admission.  She improved with intravenous diuresis.  She has had a permanent dual-chamber pacemaker placed and said that she feels much better.  I have personally reviewed her 2D echocardiogram, cardiac catheterization, and CTA studies.  Her echocardiogram shows a mean gradient across aortic valve of 55.5  mmHg which has significantly increased from her prior echocardiogram on 06/07/2017 when her mean gradient was 28 mmHg.  She also has severe mitral annular calcification, a calcified degenerative mitral valve and moderate to severe mitral valve stenosis with a mean gradient of 10 mmHg which is increased from her previous echocardiogram where her gradient was 5 mmHg.  Left ventricular systolic function is normal.  Cardiac catheterization shows severe single-vessel disease with occlusion of the mid right coronary artery and continued patency of the saphenous vein graft to the posterior descending artery.  There is no other significant coronary disease.  She is symptomatically much better following permanent pacemaker but I agree that aortic valve replacement is indicated in this patient with severe prosthetic aortic valve stenosis.  She is definitely not a candidate for redo sternotomy and redo open surgical aortic valve replacement given her multiple comorbidities and Jehovah's Witness status.  She has a 21 mm [facility] valve which will be suitable for valve in valve TAVR using a 23 mm Medtronic Evolut valve.  Her gated cardiac CTA shows anatomy suitable for valve in valve TAVR.  Her abdominal pelvic CTA shows adequate pelvic vascular anatomy to allow transfemoral insertion.  The patient was counseled at length regarding treatment alternatives for management of severe symptomatic prosthetic aortic valve stenosis. The risks and benefits of surgical intervention has been discussed in detail. Long-term prognosis with medical therapy  was discussed. Alternative approaches such as conventional surgical aortic valve replacement, transcatheter aortic valve replacement, and palliative medical therapy were compared and contrasted at length. This discussion was placed in the context of the patient's own specific clinical presentation and past medical history. All of her questions have been addressed.    Following the  decision to proceed with transcatheter aortic valve replacement, a discussion was held regarding what types of management strategies would be attempted intraoperatively in the event of life-threatening complications, including whether or not the patient would be considered a candidate for the use of cardiopulmonary bypass and/or conversion to open sternotomy for attempted surgical intervention.  I do not think she would be a candidate for emergent redo sternotomy to manage any intraoperative complications. The patient has been advised of a variety of complications that might develop including but not limited to risks of death, stroke, paravalvular leak, aortic dissection or other major vascular complications, aortic annulus rupture, device embolization, cardiac rupture or perforation, mitral regurgitation, acute myocardial infarction, arrhythmia, heart block or bradycardia requiring permanent pacemaker placement, congestive heart failure, respiratory failure, renal failure, pneumonia, infection, other late complications related to structural valve deterioration or migration, or other complications that might ultimately cause a temporary or permanent loss of functional independence or other long term morbidity. The patient provides full informed consent for the procedure as described and all questions were answered.     Plan:  At this time the plan is to discharge her home to return for transfemoral TAVR using a Medtronic valve on 03/01/2019.  I spent 60 minutes performing this consultation and > 50% of this time was spent face to face counseling and coordinating the care of this patient's severe, symptomatic, prosthetic aortic valve stenosis.    Gaye Pollack, MD 02/10/2019

## 2019-02-10 NOTE — Progress Notes (Signed)
Internal Medicine Attending Note:  I have seen and evaluated this patient and I have discussed the plan of care with the house staff. Please see their note for complete details. I concur with their findings.  Patient feeling better today after pacemaker placement. Discussed plan with cardiology at bedside, appreciate assistance. Plan is for TAVR CT with contrast hopefully today and discharge tomorrow if renal function remains stable. Per cards, will resume oral lasix tomorrow prior to discharge. She will follow up outpatient for the remainder of her work up of her valvular disease.   We will schedule an appointment for her in our IM clinic Monday for hospital follow and repeat labs.   Velna Ochs, MD 02/10/2019, 1:45 PM

## 2019-02-10 NOTE — Progress Notes (Addendum)
  [place] VALVE TEAM  Patient doing well after pacemaker implant. She is feeling much better. Shortness of breath improved and appears euvolemic. Creat down to 1.35. Not getting any diuretic today. Plan to get pre TAVR scans today. I have placed an order for an 18 AC IV and lopressor to be given 1 hour before scans. She cannot lift arm on side of new pacemaker. Dr. Cyndia Bent to see in consultation later this afternoon or tomorrow. Plan for discharge home and bring her back in early January for TAVR.  Angelena Form PA-C  [facility]

## 2019-02-10 NOTE — Progress Notes (Addendum)
Physical Therapy Treatment Patient Details Name: [name] MRN: [id number] DOB: 07-06-1945 Today's Date: 02/10/2019    History of Present Illness [name] is a 73 y.o. female with medical history significant of hypertension, diabetes mellitus, coronary artery disease status post CABG with AVR in 2006-complicated by CVA, CKD stage III, insulin-dependent diabetes mellitus, hyperlipidemia, chronic diastolic congestive heart failure, history of adenocarcinoma of breast status post mastectomy in 1996, depression, arthritis presents to emergency department due to worsening shortness of breath and leg swelling since 4 days. 12/16 pacemaker placement    PT Comments    Patient seen for mobility progression. Pt requires +2 assist for bed mobility and functional transfer training. Patient with improved sitting balance EOB this session. Sit to stand transfer attempted X 2 trials but unable to get into standing today. Current plan remains appropriate.   At this time pt unable to complete Pre TAVR assessment due to debility.     Follow Up Recommendations  CIR;Supervision/Assistance - 24 hour     Equipment Recommendations  None recommended by PT(TBD at next venue)    Recommendations for Other Services Rehab consult     Precautions / Restrictions Precautions Precautions: Fall;ICD/Pacemaker Precaution Comments: residual L sided weakness from previous stroke Restrictions Weight Bearing Restrictions: No    Mobility  Bed Mobility Overal bed mobility: Needs Assistance Bed Mobility: Rolling;Sidelying to Sit;Sit to Supine Rolling: Mod assist Sidelying to sit: Max assist;+2 for physical assistance   Sit to supine: Max assist;+2 for physical assistance   General bed mobility comments: increased assist needed this session given R UE ROM precautions after pacemaker placement; pt assisted with bed rail to roll toward L side; use of bed pad for hips   Transfers Overall transfer level:  Needs assistance Equipment used: Rolling walker (2 wheeled) Transfers: Sit to/from Stand Sit to Stand: Max assist;+2 physical assistance;From elevated surface         General transfer comment: pt with limited L knee flexion; R LE blocked and +2 assist to power up; attempted to stand X 2 trials however unable to lift buttocks from EOB  Ambulation/Gait             General Gait Details: unable this date   Stairs             Wheelchair Mobility    Modified Rankin (Stroke Patients Only)       Balance Overall balance assessment: Needs assistance Sitting-balance support: Feet supported;Single extremity supported Sitting balance-Leahy Scale: Fair Sitting balance - Comments: initially pt required assistance to maintain sitting balance due to posterior bias however improved with time and repositioning of bilat LE; pt able to maintain balance with min guard                                     Cognition Arousal/Alertness: Awake/alert Behavior During Therapy: WFL for tasks assessed/performed Overall Cognitive Status: No family/caregiver present to determine baseline cognitive functioning                                 General Comments: difficulty with problem solving and decreased awareness of deficits      Exercises      General Comments        Pertinent Vitals/Pain Pain Assessment: Faces Faces Pain Scale: Hurts little more Pain Location: pacemaker site with bed mobility Pain  Descriptors / Indicators: Aching;Grimacing;Guarding Pain Intervention(s): Limited activity within patient's tolerance;Monitored during session;Repositioned    Home Living                      Prior Function            PT Goals (current goals can now be found in the care plan section) Acute Rehab PT Goals Patient Stated Goal: get stronger Progress towards PT goals: Progressing toward goals    Frequency    Min 3X/week      PT Plan  Current plan remains appropriate    Co-evaluation              AM-PAC PT "6 Clicks" Mobility   Outcome Measure  Help needed turning from your back to your side while in a flat bed without using bedrails?: A Lot Help needed moving from lying on your back to sitting on the side of a flat bed without using bedrails?: A Lot Help needed moving to and from a bed to a chair (including a wheelchair)?: Total Help needed standing up from a chair using your arms (e.g., wheelchair or bedside chair)?: Total Help needed to walk in hospital room?: Total Help needed climbing 3-5 steps with a railing? : Total 6 Click Score: 8    End of Session Equipment Utilized During Treatment: Gait belt Activity Tolerance: Patient tolerated treatment well Patient left: in bed;with call bell/phone within reach;with bed alarm set;Other (comment);with SCD's reapplied(bed in chair position) Nurse Communication: Mobility status;Need for lift equipment PT Visit Diagnosis: Unsteadiness on feet (R26.81);Difficulty in walking, not elsewhere classified (R26.2)     Time: 8937-3428 PT Time Calculation (min) (ACUTE ONLY): 36 min  Charges:  $Gait Training: 8-22 mins $Therapeutic Activity: 8-22 mins                     Earney Navy, PTA Acute Rehabilitation Services Pager: [phone] Office: [phone]     Darliss Cheney 02/10/2019, 11:01 AM

## 2019-02-10 NOTE — Progress Notes (Addendum)
Progress Note  Patient Name: [name] Date of Encounter: 02/10/2019  Primary Cardiologist: [name], MD   Subjective   Feeling much better after pacemaker implantation  Inpatient Medications    Scheduled Meds: . aspirin EC  81 mg Oral Daily  . atorvastatin  80 mg Oral q1800  . enoxaparin (LOVENOX) injection  30 mg Subcutaneous Q24H  . influenza vaccine adjuvanted  0.5 mL Intramuscular Tomorrow-1000  . insulin aspart  0-15 Units Subcutaneous TID WC  . insulin aspart  0-5 Units Subcutaneous QHS  . PARoxetine  30 mg Oral Daily  . polyethylene glycol  17 g Oral Daily  . sodium chloride flush  3 mL Intravenous Q12H  . sodium chloride flush  3 mL Intravenous Q12H   Continuous Infusions: . sodium chloride 250 mL (02/09/19 2005)   PRN Meds: sodium chloride, acetaminophen, hydrALAZINE, ondansetron (ZOFRAN) IV, senna-docusate, sodium chloride flush   Vital Signs    Vitals:   02/10/19 0554 02/10/19 0555 02/10/19 0556 02/10/19 0849  BP: (!) 184/84   (!) 164/77  Pulse: 72   84  Resp: $Remo'18 16 17   'EYohD$ Temp: 98.6 F (37 C)     TempSrc: Oral     SpO2: 94% 94% 94%   Weight: 77.2 kg     Height:        Intake/Output Summary (Last 24 hours) at 02/10/2019 1003 Last data filed at 02/10/2019 0500 Gross per 24 hour  Intake 408.9 ml  Output 500 ml  Net -91.1 ml   Last 3 Weights 02/10/2019 02/09/2019 02/08/2019  Weight (lbs) 170 lb 3.1 oz 171 lb 4.8 oz 166 lb 14.2 oz  Weight (kg) 77.2 kg 77.7 kg 75.7 kg      Telemetry    V paced rhythm - Personally Reviewed  ECG    A sense V paced- Personally Reviewed  Physical Exam   GEN: No acute distress.   Neck: No JVD Cardiac: RRR, 3/6 SEM late peaking leading into S2, no rubs, or gallops.  Chest: Pacemaker generator site dressed on right chest wall, clean and dry.  Dressing intact.  No surrounding erythema. Respiratory: Clear to auscultation bilaterally. GI: Soft, nontender, non-distended  MS: No edema; No  deformity. Neuro:  Nonfocal  Psych: Normal affect   Labs    High Sensitivity Troponin:   Recent Labs  Lab 02/07/19 1323 02/07/19 1626  TROPONINIHS 24* 26*      Chemistry Recent Labs  Lab 02/07/19 1323 02/08/19 0556 02/09/19 0508 02/09/19 1058 02/09/19 1104 02/10/19 0431  NA 143 145 141 143 142 143  K 3.3* 4.1 3.9 4.0 4.0 3.8  CL 110 109 104  --   --  104  CO2 $Re'23 27 26  'Onx$ --   --  28  GLUCOSE 203* 143* 169*  --   --  156*  BUN 25* 26* 26*  --   --  21  CREATININE 1.48* 1.58* 1.79*  --   --  1.35*  CALCIUM 8.2* 9.0 8.7*  --   --  8.4*  PROT 6.5  --   --   --   --   --   ALBUMIN 3.1*  --   --   --   --   --   AST 13*  --   --   --   --   --   ALT 12  --   --   --   --   --   ALKPHOS 96  --   --   --   --   --  BILITOT 1.0  --   --   --   --   --   GFRNONAA 34* 32* 27*  --   --  38*  GFRAA 40* 37* 32*  --   --  44*  ANIONGAP $RemoveB'10 9 11  'sxeWxIJh$ --   --  11     Hematology Recent Labs  Lab 02/07/19 1323 02/09/19 1058 02/09/19 1104 02/10/19 0431  WBC 11.7*  --   --  6.6  RBC 3.60*  --   --  3.07*  HGB 11.0* 9.5* 9.2* 9.5*  HCT 34.4* 28.0* 27.0* 29.1*  MCV 95.6  --   --  94.8  MCH 30.6  --   --  30.9  MCHC 32.0  --   --  32.6  RDW 14.0  --   --  13.6  PLT 207  --   --  187    BNP Recent Labs  Lab 02/07/19 1323  BNP 2,131.7*     DDimer No results for input(s): DDIMER in the last 168 hours.   Radiology    DG Chest 2 View  Result Date: 02/10/2019 CLINICAL DATA:  Cardiac device placement EXAM: CHEST - 2 VIEW COMPARISON:  Three days ago FINDINGS: New dual-chamber pacer from the right. There is a loop at the level of the right ventricular catheter. Right atrial catheter with tip over the atrial appendage. No pneumothorax or new mediastinal widening. CABG and aortic valve replacement. Cardiomegaly. Improved or resolved pulmonary edema. IMPRESSION: 1. New dual-chamber pacer implant. 2. Improved or resolved pulmonary edema. Electronically Signed   By: Monte Fantasia M.D.    On: 02/10/2019 07:42   CARDIAC CATHETERIZATION  Result Date: 02/09/2019 1.  Severe single-vessel coronary artery disease with total occlusion of the mid RCA and patency of the saphenous vein graft PDA 2.  Patent left main, LAD, and left circumflex with mild diffuse irregularity but no significant stenoses 3.  Known critical bioprosthetic aortic stenosis, valve not crossed during this procedure 4.  Mild pulmonary hypertension with mean PA pressure 26 mmHg, secondary to left heart disease (pulmonary capillary wedge pressure mean 25 mmHg) Plan: Multidisciplinary heart valve team review, EP consultation for consideration of permanent pacemaker in this patient with complete heart block and left bundle branch block and pending TAVR valve in valve.  EP PPM/ICD IMPLANT  Result Date: 02/09/2019 SURGEON:  Will Meredith Leeds, MD   PREPROCEDURE DIAGNOSIS:  Complete AV block   POSTPROCEDURE DIAGNOSIS:  Complete AV block    PROCEDURES:  1. Pacemaker implantation.   INTRODUCTION: [name] is a 73 y.o. female  with a history of bradycardia who presents today for pacemaker implantation.  The patient reports intermittent episodes of shortness of breath over the past few months.  No reversible causes have been identified.  The patient therefore presents today for pacemaker implantation.   DESCRIPTION OF PROCEDURE:  Informed written consent was obtained, and  the patient was brought to the electrophysiology lab in a fasting state.  The patient required no sedation for the procedure today.  The patients right chest was prepped and draped in the usual sterile fashion by the EP lab staff. The skin overlying the right deltopectoral region was infiltrated with lidocaine for local analgesia.  A 4-cm incision was made over the right deltopectoral region.  A right subcutaneous pacemaker pocket was fashioned using a combination of sharp and blunt dissection. Electrocautery was required to assure hemostasis.  RA/RV Lead  Placement: The right cephalic vein was therefore  directly visualized and cannulated.  Through the right cephalic vein, a Medtronic model 5076 (serial number [id number]) right atrial lead and a Medtronic model 5076 (serial number [id number]) right ventricular lead were advanced  with fluoroscopic visualization into the right atrial appendage and right ventricular apex positions respectively.  Initial atrial lead P- waves measured 2.73mV with impedance of 527 ohms and a threshold of 0.8 V at 0.5 msec.  Right ventricular lead R-waves measured 20.2 mV with an impedance of 759 ohms and a threshold of 0.5 V at 0.5 msec.  Both leads were secured to the pectoralis fascia using #2-0 silk over the suture sleeves. Device Placement:  The leads were then connected to a Medtronic Azure XT DR MRI Sure Scan (serial number [id number]) pacemaker.  The pocket was irrigated with copious gentamicin solution.  The pacemaker was then placed into the pocket.  The pocket was then closed in 3 layers with 2.0 Vicryl suture for the subcutaneous and 3.0 Vicryl suture subcuticular layers.  Steri-Strips and a sterile dressing were then applied. EBL<2ml. There were no early apparent complications.   CONCLUSIONS:  1. Successful implantation of a Medtronic Azure XT DR MRI Sure Scan dual-chamber pacemaker for symptomatic bradycardia  2. No early apparent complications.       Will Meredith Leeds, MD 02/09/2019 2:51 PM   VAS US CAROTID  Result Date: 02/09/2019 Carotid Arterial Duplex Study Indications:       Pre tavr. Risk Factors:      Hypertension, hyperlipidemia, Diabetes, coronary artery                    disease, prior CVA. Comparison Study:  no prior Performing Technologist: Abram Sander RVS  Examination Guidelines: A complete evaluation includes B-mode imaging, spectral Doppler, color Doppler, and power Doppler as needed of all accessible portions of each vessel. Bilateral testing is considered an integral part of a complete  examination. Limited examinations for reoccurring indications may be performed as noted.  Right Carotid Findings: +----------+--------+--------+--------+------------------+--------+           PSV cm/sEDV cm/sStenosisPlaque DescriptionComments +----------+--------+--------+--------+------------------+--------+ CCA Prox  93      12              heterogenous               +----------+--------+--------+--------+------------------+--------+ CCA Distal85      15              heterogenous               +----------+--------+--------+--------+------------------+--------+ ICA Prox  81      18      1-39%   heterogenous               +----------+--------+--------+--------+------------------+--------+ ICA Distal60      12                                         +----------+--------+--------+--------+------------------+--------+ ECA       91                                                 +----------+--------+--------+--------+------------------+--------+ +----------+--------+-------+--------+-------------------+           PSV cm/sEDV cmsDescribeArm Pressure (mmHG) +----------+--------+-------+--------+-------------------+ [id number]                                        +----------+--------+-------+--------+-------------------+ +---------+--------+--+--------+-+---------+  VertebralPSV cm/s46EDV cm/s9Antegrade +---------+--------+--+--------+-+---------+  Left Carotid Findings: +----------+--------+--------+--------+------------------+--------+           PSV cm/sEDV cm/sStenosisPlaque DescriptionComments +----------+--------+--------+--------+------------------+--------+ CCA Prox  100     9               heterogenous               +----------+--------+--------+--------+------------------+--------+ CCA Distal65      10              heterogenous               +----------+--------+--------+--------+------------------+--------+ ICA Prox  82       18      1-39%   heterogenous               +----------+--------+--------+--------+------------------+--------+ ICA Distal109     20                                         +----------+--------+--------+--------+------------------+--------+ ECA       86                                                 +----------+--------+--------+--------+------------------+--------+ +----------+--------+--------+--------+-------------------+           PSV cm/sEDV cm/sDescribeArm Pressure (mmHG) +----------+--------+--------+--------+-------------------+ [id number]                                          +----------+--------+--------+--------+-------------------+ +---------+--------+--+--------+--+---------+ VertebralPSV cm/s71EDV cm/s11Antegrade +---------+--------+--+--------+--+---------+  Summary: Right Carotid: Velocities in the right ICA are consistent with a 1-39% stenosis. Left Carotid: Velocities in the left ICA are consistent with a 1-39% stenosis. Vertebrals: Bilateral vertebral arteries demonstrate antegrade flow. *See table(s) above for measurements and observations.  Electronically signed by Monica Martinez MD on 02/09/2019 at 12:44:49 PM.    Final     Cardiac Studies  Echo 02/08/19  1. Left ventricular ejection fraction, by visual estimation, is 70 to 75%. The left ventricle has hyperdynamic function. There is moderately increased left ventricular hypertrophy.  2. Abnormal septal motion consistent with post-operative status.  3. Elevated left atrial pressure.  4. The left ventricle has no regional wall motion abnormalities.  5. Global right ventricle has normal systolic function.The right ventricular size is normal. No increase in right ventricular wall thickness.  6. Left atrial size was severely dilated.  7. Right atrial size was mildly dilated.  8. Severe mitral annular calcification.  9. The mitral valve is degenerative. Mild mitral valve regurgitation.  Moderate-severe mitral stenosis. 10. MV mean gradient, 10 mmHg at 45 bpm. Calculated area is 1.4 cm sq by PHT and 1.1 cm sq by continuity equation. 11. Severe aortic valve bioprosthesis stenosis. Mean gradient is 55 mm Hg, dimensionless obstructive index 0.24. 12. Aortic valve regurgitation is mild. 13. The tricuspid valve is normal in structure. Tricuspid valve regurgitation is mild. 14. Pulmonic regurgitation is mild. 15. The pulmonic valve was grossly normal. Pulmonic valve regurgitation is mild. 16. Severely elevated pulmonary artery systolic pressure. 17. The tricuspid regurgitant velocity is 3.97 m/s, and with an assumed right atrial pressure of 15 mmHg, the estimated right ventricular systolic pressure is severely elevated at 78.0 mmHg. 18. The inferior vena cava  is dilated in size with <50% respiratory variability, suggesting right atrial pressure of 15 mmHg.  R/LHC 12/16  1.  Severe single-vessel coronary artery disease with total occlusion of the mid RCA and patency of the saphenous vein graft PDA 2.  Patent left main, LAD, and left circumflex with mild diffuse irregularity but no significant stenoses 3.  Known critical bioprosthetic aortic stenosis, valve not crossed during this procedure 4.  Mild pulmonary hypertension with mean PA pressure 26 mmHg, secondary to left heart disease (pulmonary capillary wedge pressure mean 25 mmHg)  Plan: Multidisciplinary heart valve team review, EP consultation for consideration of permanent pacemaker in this patient with complete heart block and left bundle branch block and pending TAVR valve in valve.  Patient Profile     [name] is a 73 y.o. 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  Assessment & Plan    Principal  Problem:   Acute on chronic diastolic CHF (congestive heart failure) (HCC) Active Problems:   DM (diabetes mellitus) ([place])   Hyperlipidemia   Aortic stenosis s/p Tissure AVR 2006   CAD (coronary artery disease)   Hypertension   CKD (chronic kidney disease), stage III   Hypokalemia   Bradycardia   Acute on chronic diastolic (congestive) heart failure (HCC)   Complete heart block ([place])  1.  Very severe bioprosthetic aortic valve stenosis-discussed in a multidisciplinary fashion.  I have discussed with the patient that we will pursue TAVR CT for planning for valve in valve aortic valve replacement.  Her creatinine has improved and we can perform the study today, this will be coordinated by the structural heart team.  She remains on a 81 mg aspirin for the indication of bioprosthetic aortic valve.  Once her TAVR CT is complete we will discuss this with our structural heart team and likely complete the remainder of her work-up as an outpatient.  2.  Complete heart block status post permanent pacemaker implantation.  Chest x-ray reviewed.  She feels much better today after pacing.  Her rate is approximately 80 and device appears to be functioning appropriately.  3.  Acute on chronic diastolic heart failure-her volume status is improving, however she will likely need continued gentle diuresis.  She did not receive any Lasix yesterday with her creatinine worsening.  We will forego Lasix this morning in anticipation of a contrast load, however I will plan to reinitiate Lasix orally tomorrow for a home-going regimen.    4.  Moderate to severe stenosis with severe mitral annular calcification-she is minimally ambulatory at baseline but does walk with a walker to the bathroom at home.   5.  Hypertension-Home medications include amlodipine 10 mg daily hydralazine 50 mg 3 times daily metoprolol succinate 25 mg daily.  We will obtain better control of her blood pressure, however being very cautious to avoid  hypotension in the setting of the very severely stenotic aortic valve.  Plan to reinitiate beta-blockade, she will receive beta-blockade today in anticipation of her TAVR CT this can be reinitiated tomorrow.  6.  CAD status post one-vessel CABG at the time of AVR 2006-she is on aspirin, statin, home beta-blocker which was held in the setting of complete heart block.  Disease has not significantly changed on cath yesterday.  7.  Mild pulmonary hypertension secondary  to left heart disease-likely needs gentle diuresis with an eye towards her renal function, and management of her valve disease.      For questions or updates, please contact [place] Please consult [email or web address] for contact info under        Signed, Elouise Munroe, MD  02/10/2019, 10:03 AM

## 2019-02-10 NOTE — Progress Notes (Signed)
Subjective: HD#3 (Admitt date:02/07/2019)   Patient was seen this morning on rounds.  She states that she is feeling better today than she did yesterday and has less fatigue. She states that she has not had a BM since being admitted and would like something to help her go, which I agreed to. PT recommendations were discussed with the patient, and she stated that should would not like to do PT at a nursing facility. She might be interested in CIR, otherwise would prefer home health.  Objective:  Vital signs in last 24 hours: Vitals:   02/10/19 0553 02/10/19 0554 02/10/19 0555 02/10/19 0556  BP:  (!) 184/84    Pulse:  72    Resp: $Remo'17 18 16 17  'VGLJC$ Temp:  98.6 F (37 C)    TempSrc:  Oral    SpO2: 93% 94% 94% 94%  Weight:  77.2 kg    Height:        Physical Exam: Physical Exam  Constitutional: She is oriented to person, place, and time and well-developed, well-nourished, and in no distress.  HENT:  Head: Normocephalic and atraumatic.  Eyes: EOM are normal.  Cardiovascular: Normal rate and regular rhythm.  Murmur heard. Pulmonary/Chest: Effort normal. No respiratory distress. She exhibits no tenderness.  Abdominal: Soft. She exhibits no distension. There is no abdominal tenderness.  Musculoskeletal:        General: Edema present. No tenderness. Normal range of motion.     Cervical back: Normal range of motion.  Neurological: She is alert and oriented to person, place, and time.  Skin: Skin is warm and dry.    Pertinent labs/Imaging: CBC Latest Ref Rng & Units 02/09/2019 02/09/2019 02/09/2019  WBC 4.0 - 10.5 K/uL - - -  Hemoglobin 12.0 - 15.0 g/dL 9.2(L) 9.5(L) 9.5(L)  Hematocrit 36.0 - 46.0 % 27.0(L) 28.0(L) 28.0(L)  Platelets 150 - 400 K/uL - - -    CMP Latest Ref Rng & Units 02/10/2019 02/09/2019 02/09/2019  Glucose 70 - 99 mg/dL 156(H) - -  BUN 8 - 23 mg/dL 21 - -  Creatinine 0.44 - 1.00 mg/dL 1.35(H) - -  Sodium 135 - 145 mmol/L 143 142 143  Potassium 3.5 - 5.1 mmol/L  3.8 4.0 4.0  Chloride 98 - 111 mmol/L 104 - -  CO2 22 - 32 mmol/L 28 - -  Calcium 8.9 - 10.3 mg/dL 8.4(L) - -  Total Protein 6.5 - 8.1 g/dL - - -  Total Bilirubin 0.3 - 1.2 mg/dL - - -  Alkaline Phos 38 - 126 U/L - - -  AST 15 - 41 U/L - - -  ALT 0 - 44 U/L - - -    Coronary Angiography: 1.  Severe single-vessel coronary artery disease with total occlusion of the mid RCA and patency of the saphenous vein graft PDA 2.  Patent left main, LAD, and left circumflex with mild diffuse irregularity but no significant stenoses 3.  Known critical bioprosthetic aortic stenosis, valve not crossed during this procedure 4.  Mild pulmonary hypertension with mean PA pressure 26 mmHg, secondary to left heart disease (pulmonary capillary wedge pressure mean 25 mmHg)  Assessment/Plan:  Principal Problem:   Acute on chronic diastolic CHF (congestive heart failure) (HCC) Active Problems:   DM (diabetes mellitus) ([place])   Hyperlipidemia   Aortic stenosis s/p Tissure AVR 2006   CAD (coronary artery disease)   Hypertension   CKD (chronic kidney disease), stage III   Hypokalemia   Bradycardia   Acute  on chronic diastolic (congestive) heart failure (HCC)   Complete heart block [facility])    Patient Summary: [name] is a 73 yo female with a pertinent past medical history of HFpEF s/p bioprosthetic AVR (2006), CAD s/p CAGB, CKD stage II, who was admitted to [facility] with CHF exacerbation complicated by echo showing severe bioprosthetic aortic valve stenosis and mitral valve stenosis currently being managed by cardiology and CT surgery.    #HFpEF: #CAD s/p CABG #Severe bioprosthetic aortic valve stenosis and mitral valve stenosis: - Lasix were held - Tomorrow CT/Pre-TAVR scan today. - Likely discharge with TAVR as an outpatient pending multidisciplinary team meeting to discuss her case. - I appreciate Cardiology and EP's assistance.  #Thrid-degree heart block: - Pacemaker placed yesterday (Medtronic Azure  XT DR MRI Sure Scan dual-chamber pacemaker).   #CKD Stage II: - Mild improvement of her Cr of 1.35 and GFR of 44, but overall stable renal function.  #DM: - Continue SSI  - Patient having several episodes of hyperglycemia, we will continue to monitor her blood sugar.  #HTN: - Continue HTN medications   Diet: heart health IVF: none VTE: Enoxaparin  Code: Full   Dispo: Anticipated discharge tomorrow, pending CIR place vs Home health PT.   Audrey Payment, MD 02/10/2019, 6:55 AM Pager: [phone]

## 2019-02-10 NOTE — Progress Notes (Addendum)
Electrophysiology Rounding Note  Patient Name: [name] Date of Encounter: 02/10/2019  Primary Cardiologist: [name], MD Electrophysiologist: Dr. Curt Bears   Subjective   S/p dual chamber MDT PPM for CHB   The patient is doing well this am. At this time, the patient denies chest pain, shortness of breath, or any new concerns.  CXR stable appearing leads and no PTx.   Inpatient Medications    Scheduled Meds:  aspirin EC  81 mg Oral Daily   atorvastatin  80 mg Oral q1800   enoxaparin (LOVENOX) injection  30 mg Subcutaneous Q24H   influenza vaccine adjuvanted  0.5 mL Intramuscular Tomorrow-1000   insulin aspart  0-15 Units Subcutaneous TID WC   insulin aspart  0-5 Units Subcutaneous QHS   PARoxetine  30 mg Oral Daily   sodium chloride flush  3 mL Intravenous Q12H   sodium chloride flush  3 mL Intravenous Q12H   Continuous Infusions:  sodium chloride 250 mL (02/09/19 2005)   PRN Meds: sodium chloride, acetaminophen, hydrALAZINE, ondansetron (ZOFRAN) IV, sodium chloride flush   Vital Signs    Vitals:   02/10/19 0553 02/10/19 0554 02/10/19 0555 02/10/19 0556  BP:  (!) 184/84    Pulse:  72    Resp: $Remo'17 18 16 17  'WTPvY$ Temp:  98.6 F (37 C)    TempSrc:  Oral    SpO2: 93% 94% 94% 94%  Weight:  77.2 kg    Height:        Intake/Output Summary (Last 24 hours) at 02/10/2019 0803 Last data filed at 02/10/2019 0500 Gross per 24 hour  Intake 408.9 ml  Output 500 ml  Net -91.1 ml   Filed Weights   02/08/19 0215 02/09/19 0439 02/10/19 0554  Weight: 75.7 kg 77.7 kg 77.2 kg    Physical Exam    GEN- The patient is well appearing, alert and oriented x 3 today.   Head- normocephalic, atraumatic Eyes-  Sclera clear, conjunctiva pink Ears- hearing intact Oropharynx- clear Neck- supple Lungs- Clear to ausculation bilaterally, normal work of breathing Heart- Regular rate and rhythm, no murmurs, rubs or gallops. RIGHT chest stable.  GI- soft, NT, ND, +  BS Extremities- no clubbing, cyanosis, or edema Skin- no rash or lesion Psych- euthymic mood, full affect Neuro- strength and sensation are intact  Labs    CBC Recent Labs    02/07/19 1323 02/09/19 1104 02/10/19 0431  WBC 11.7*  --  6.6  NEUTROABS 9.2*  --   --   HGB 11.0* 9.2* 9.5*  HCT 34.4* 27.0* 29.1*  MCV 95.6  --  94.8  PLT 207  --  834   Basic Metabolic Panel Recent Labs    02/07/19 1323 02/08/19 0556 02/09/19 0508 02/09/19 1104 02/10/19 0431  NA 143 145 141 142 143  K 3.3* 4.1 3.9 4.0 3.8  CL 110 109 104  --  104  CO2 $Re'23 27 26  'Xgi$ --  28  GLUCOSE 203* 143* 169*  --  156*  BUN 25* 26* 26*  --  21  CREATININE 1.48* 1.58* 1.79*  --  1.35*  CALCIUM 8.2* 9.0 8.7*  --  8.4*  MG 1.7 2.3  --   --   --   PHOS 3.6  --   --   --   --    Liver Function Tests Recent Labs    02/07/19 1323  AST 13*  ALT 12  ALKPHOS 96  BILITOT 1.0  PROT 6.5  ALBUMIN  3.1*   Recent Labs    02/07/19 1323  LIPASE 17   Cardiac Enzymes No results for input(s): CKTOTAL, CKMB, CKMBINDEX, TROPONINI in the last 72 hours.   Telemetry    V paced 70-80s (personally reviewed)  Radiology    DG Chest 2 View  Result Date: 02/10/2019 CLINICAL DATA:  Cardiac device placement EXAM: CHEST - 2 VIEW COMPARISON:  Three days ago FINDINGS: New dual-chamber pacer from the right. There is a loop at the level of the right ventricular catheter. Right atrial catheter with tip over the atrial appendage. No pneumothorax or new mediastinal widening. CABG and aortic valve replacement. Cardiomegaly. Improved or resolved pulmonary edema. IMPRESSION: 1. New dual-chamber pacer implant. 2. Improved or resolved pulmonary edema. Electronically Signed   By: Monte Fantasia M.D.   On: 02/10/2019 07:42   CARDIAC CATHETERIZATION  Result Date: 02/09/2019 1.  Severe single-vessel coronary artery disease with total occlusion of the mid RCA and patency of the saphenous vein graft PDA 2.  Patent left main, LAD, and left  circumflex with mild diffuse irregularity but no significant stenoses 3.  Known critical bioprosthetic aortic stenosis, valve not crossed during this procedure 4.  Mild pulmonary hypertension with mean PA pressure 26 mmHg, secondary to left heart disease (pulmonary capillary wedge pressure mean 25 mmHg) Plan: Multidisciplinary heart valve team review, EP consultation for consideration of permanent pacemaker in this patient with complete heart block and left bundle branch block and pending TAVR valve in valve.  EP PPM/ICD IMPLANT  Result Date: 02/09/2019 SURGEON:  Giovany Cosby Meredith Leeds, MD   PREPROCEDURE DIAGNOSIS:  Complete AV block   POSTPROCEDURE DIAGNOSIS:  Complete AV block    PROCEDURES:  1. Pacemaker implantation.   INTRODUCTION: [name] is a 73 y.o. female  with a history of bradycardia who presents today for pacemaker implantation.  The patient reports intermittent episodes of shortness of breath over the past few months.  No reversible causes have been identified.  The patient therefore presents today for pacemaker implantation.   DESCRIPTION OF PROCEDURE:  Informed written consent was obtained, and  the patient was brought to the electrophysiology lab in a fasting state.  The patient required no sedation for the procedure today.  The patients right chest was prepped and draped in the usual sterile fashion by the EP lab staff. The skin overlying the right deltopectoral region was infiltrated with lidocaine for local analgesia.  A 4-cm incision was made over the right deltopectoral region.  A right subcutaneous pacemaker pocket was fashioned using a combination of sharp and blunt dissection. Electrocautery was required to assure hemostasis.  RA/RV Lead Placement: The right cephalic vein was therefore directly visualized and cannulated.  Through the right cephalic vein, a Medtronic model 5076 (serial number [id number]) right atrial lead and a Medtronic model 5076 (serial number [id number]) right  ventricular lead were advanced  with fluoroscopic visualization into the right atrial appendage and right ventricular apex positions respectively.  Initial atrial lead P- waves measured 2.58mV with impedance of 527 ohms and a threshold of 0.8 V at 0.5 msec.  Right ventricular lead R-waves measured 20.2 mV with an impedance of 759 ohms and a threshold of 0.5 V at 0.5 msec.  Both leads were secured to the pectoralis fascia using #2-0 silk over the suture sleeves. Device Placement:  The leads were then connected to a Medtronic Azure XT DR MRI Sure Scan (serial number [id number]) pacemaker.  The pocket was irrigated with copious  gentamicin solution.  The pacemaker was then placed into the pocket.  The pocket was then closed in 3 layers with 2.0 Vicryl suture for the subcutaneous and 3.0 Vicryl suture subcuticular layers.  Steri-Strips and a sterile dressing were then applied. EBL<32ml. There were no early apparent complications.   CONCLUSIONS:  1. Successful implantation of a Medtronic Azure XT DR MRI Sure Scan dual-chamber pacemaker for symptomatic bradycardia  2. No early apparent complications.       Franchon Ketterman Meredith Leeds, MD 02/09/2019 2:51 PM   ECHOCARDIOGRAM COMPLETE  Result Date: 02/08/2019   ECHOCARDIOGRAM REPORT   Patient Name:   ANADELIA KINTZ Date of Exam: 02/08/2019 Medical Rec #:  [id number]        Height:       60.0 in Accession #:    [id number]       Weight:       166.9 lb Date of Birth:  October 31, 1945        BSA:          1.73 m Patient Age:    38 years         BP:           149/53 mmHg Patient Gender: F                HR:           45 bpm. Exam Location:  Inpatient Procedure: 2D Echo, Color Doppler and Cardiac Doppler Indications:    I35.0 Nonrheumatic aortic (valve) stenosis; I50.31 Acute                 diastolic (congestive) heart failure  History:        Patient has prior history of Echocardiogram examinations, most                 recent 06/25/2017. CHF, CAD; Risk Factors:Hypertension, Diabetes                  and Dyslipidemia. Aortic Valve: A porcine aortic valve                 bioprosthesis was implanted 06/2004. Covid+ on 07/10/18.  Sonographer:    Raquel Sarna Senior RDCS Referring Phys: [id number] Elouise Munroe  Sonographer Comments: Image acquisition challenging due to respiratory motion. IMPRESSIONS  1. Left ventricular ejection fraction, by visual estimation, is 70 to 75%. The left ventricle has hyperdynamic function. There is moderately increased left ventricular hypertrophy.  2. Abnormal septal motion consistent with post-operative status.  3. Elevated left atrial pressure.  4. The left ventricle has no regional wall motion abnormalities.  5. Global right ventricle has normal systolic function.The right ventricular size is normal. No increase in right ventricular wall thickness.  6. Left atrial size was severely dilated.  7. Right atrial size was mildly dilated.  8. Severe mitral annular calcification.  9. The mitral valve is degenerative. Mild mitral valve regurgitation. Moderate-severe mitral stenosis. 10. MV mean gradient, 10 mmHg at 45 bpm. Calculated area is 1.4 cm sq by PHT and 1.1 cm sq by continuity equation. 11. Severe aortic valve bioprosthesis stenosis. Mean gradient is 55 mm Hg, dimensionless obstructive index 0.24. 12. Aortic valve regurgitation is mild. 13. The tricuspid valve is normal in structure. Tricuspid valve regurgitation is mild. 14. Pulmonic regurgitation is mild. 15. The pulmonic valve was grossly normal. Pulmonic valve regurgitation is mild. 16. Severely elevated pulmonary artery systolic pressure. 17. The tricuspid regurgitant velocity is 3.97 m/s, and with an assumed right  atrial pressure of 15 mmHg, the estimated right ventricular systolic pressure is severely elevated at 78.0 mmHg. 18. The inferior vena cava is dilated in size with <50% respiratory variability, suggesting right atrial pressure of 15 mmHg. FINDINGS  Left Ventricle: Left ventricular ejection fraction, by visual  estimation, is 70 to 75%. The left ventricle has hyperdynamic function. The left ventricle has no regional wall motion abnormalities. The left ventricular internal cavity size was the left ventricle is normal in size. There is moderately increased left ventricular hypertrophy. Concentric left ventricular hypertrophy. Abnormal (paradoxical) septal motion consistent with post-operative status. The left ventricular diastology could not be evaluated due to mitral stenosis. Elevated left atrial pressure. Right Ventricle: The right ventricular size is normal. No increase in right ventricular wall thickness. Global RV systolic function is has normal systolic function. The tricuspid regurgitant velocity is 3.97 m/s, and with an assumed right atrial pressure  of 15 mmHg, the estimated right ventricular systolic pressure is severely elevated at 78.0 mmHg. Left Atrium: Left atrial size was severely dilated. Right Atrium: Right atrial size was mildly dilated Pericardium: There is no evidence of pericardial effusion. Mitral Valve: The mitral valve is degenerative in appearance. Severe mitral annular calcification. Mild mitral valve regurgitation. Moderate-severe mitral valve stenosis by observation. MV peak gradient, 34.8 mmHg. (peak gradient is exaggerated by asynchronous atrial contraction). Mean gradient is 10 mm Hg, despite bradycardia 45 bpm. Tricuspid Valve: The tricuspid valve is normal in structure. Tricuspid valve regurgitation is mild. Aortic Valve: The aortic valve has been repaired/replaced. Aortic valve regurgitation is mild. Severe aortic stenosis is present. Aortic valve mean gradient measures 55.5 mmHg. Aortic valve peak gradient measures 89.1 mmHg. Aortic valve area, by VTI measures 0.77 cm. Porcine aortic valve bioprosthesis valve is present in the aortic position. Procedure Date: 06/2004. Pulmonic Valve: The pulmonic valve was grossly normal. Pulmonic valve regurgitation is mild. Pulmonic regurgitation is  mild. Aorta: The aortic root and ascending aorta are structurally normal, with no evidence of dilitation. Venous: The inferior vena cava is dilated in size with less than 50% respiratory variability, suggesting right atrial pressure of 15 mmHg. IAS/Shunts: No atrial level shunt detected by color flow Doppler.  LEFT VENTRICLE PLAX 2D LVIDd:         3.90 cm  Diastology LVIDs:         2.20 cm  LV e' lateral:   6.73 cm/s LV PW:         1.50 cm  LV E/e' lateral: 34.8 LV IVS:        1.70 cm  LV e' medial:    4.87 cm/s LVOT diam:     2.03 cm  LV E/e' medial:  48.0 LV SV:         50 ml LV SV Index:   27.26 LVOT Area:     3.24 cm  RIGHT VENTRICLE RV S prime:     9.08 cm/s TAPSE (M-mode): 1.9 cm LEFT ATRIUM             Index       RIGHT ATRIUM           Index LA diam:        3.90 cm 2.26 cm/m  RA Area:     18.00 cm LA Vol (A2C):   89.2 ml 51.61 ml/m RA Volume:   50.20 ml  29.04 ml/m LA Vol (A4C):   75.0 ml 43.39 ml/m LA Biplane Vol: 92.1 ml 53.29 ml/m  AORTIC VALVE AV Area (Vmax):  0.68 cm AV Area (Vmean):   0.69 cm AV Area (VTI):     0.77 cm AV Vmax:           472.00 cm/s AV Vmean:          348.000 cm/s AV VTI:            1.190 m AV Peak Grad:      89.1 mmHg AV Mean Grad:      55.5 mmHg LVOT Vmax:         98.50 cm/s LVOT Vmean:        74.500 cm/s LVOT VTI:          0.281 m LVOT/AV VTI ratio: 0.24  AORTA Ao Root diam: 2.80 cm Ao Asc diam:  3.30 cm MITRAL VALVE                         TRICUSPID VALVE MV Area (PHT): 1.44 cm              TR Peak grad:   63.0 mmHg MV Peak grad:  34.8 mmHg             TR Vmax:        397.00 cm/s MV Mean grad:  10.0 mmHg MV Vmax:       2.95 m/s              SHUNTS MV Vmean:      142.0 cm/s            Systemic VTI:  0.28 m MV VTI:        0.79 m                Systemic Diam: 2.03 cm MV PHT:        153.25 msec MV Decel Time: 528 msec MR PISA: 0.61 cm MV E velocity: 234.00 cm/s 103 cm/s MV A velocity: 188.00 cm/s 70.3 cm/s MV E/A ratio:  1.24        1.5  Mihai Croitoru MD Electronically  signed by Sanda Klein MD Signature Date/Time: 02/08/2019/10:49:20 AM    Final    VAS US CAROTID  Result Date: 02/09/2019 Carotid Arterial Duplex Study Indications:       Pre tavr. Risk Factors:      Hypertension, hyperlipidemia, Diabetes, coronary artery                    disease, prior CVA. Comparison Study:  no prior Performing Technologist: Abram Sander RVS  Examination Guidelines: A complete evaluation includes B-mode imaging, spectral Doppler, color Doppler, and power Doppler as needed of all accessible portions of each vessel. Bilateral testing is considered an integral part of a complete examination. Limited examinations for reoccurring indications may be performed as noted.  Right Carotid Findings: +----------+--------+--------+--------+------------------+--------+           PSV cm/sEDV cm/sStenosisPlaque DescriptionComments +----------+--------+--------+--------+------------------+--------+ CCA Prox  93      12              heterogenous               +----------+--------+--------+--------+------------------+--------+ CCA Distal85      15              heterogenous               +----------+--------+--------+--------+------------------+--------+ ICA Prox  81      18      1-39%   heterogenous               +----------+--------+--------+--------+------------------+--------+  ICA Distal60      12                                         +----------+--------+--------+--------+------------------+--------+ ECA       91                                                 +----------+--------+--------+--------+------------------+--------+ +----------+--------+-------+--------+-------------------+           PSV cm/sEDV cmsDescribeArm Pressure (mmHG) +----------+--------+-------+--------+-------------------+ Subclavian116                                        +----------+--------+-------+--------+-------------------+ +---------+--------+--+--------+-+---------+  VertebralPSV cm/s46EDV cm/s9Antegrade +---------+--------+--+--------+-+---------+  Left Carotid Findings: +----------+--------+--------+--------+------------------+--------+           PSV cm/sEDV cm/sStenosisPlaque DescriptionComments +----------+--------+--------+--------+------------------+--------+ CCA Prox  100     9               heterogenous               +----------+--------+--------+--------+------------------+--------+ CCA Distal65      10              heterogenous               +----------+--------+--------+--------+------------------+--------+ ICA Prox  82      18      1-39%   heterogenous               +----------+--------+--------+--------+------------------+--------+ ICA Distal109     20                                         +----------+--------+--------+--------+------------------+--------+ ECA       86                                                 +----------+--------+--------+--------+------------------+--------+ +----------+--------+--------+--------+-------------------+           PSV cm/sEDV cm/sDescribeArm Pressure (mmHG) +----------+--------+--------+--------+-------------------+ [id number]                                          +----------+--------+--------+--------+-------------------+ +---------+--------+--+--------+--+---------+ VertebralPSV cm/s71EDV cm/s11Antegrade +---------+--------+--+--------+--+---------+  Summary: Right Carotid: Velocities in the right ICA are consistent with a 1-39% stenosis. Left Carotid: Velocities in the left ICA are consistent with a 1-39% stenosis. Vertebrals: Bilateral vertebral arteries demonstrate antegrade flow. *See table(s) above for measurements and observations.  Electronically signed by Monica Martinez MD on 02/09/2019 at 12:44:49 PM.    Final     Patient Profile     OLIANA GOWENS is a 73 y.o. female with a history of CAD s/p CABG with AVR 5956, course complicated by  CVA, CKD 2, IDDM, HLD, HTN, chronic diastolic CHF, and h/o breast cancer who is being seen for the evaluation of complete heart block at the request of Dr. Margaretann Loveless.  Assessment & Plan    1. CHB  S/p MDT dual chamber PPM 02/09/2019 P waves slightly decreased this am, but thresholds stable.  Usual follow up set up and wound care and arm restrictions reviewed.   2. Aortic stenosis Discussed personally with Dr. Burt Knack. She Derick Seminara get a scan tomorrow if her kidney function is stable, and plan for TAVR early January. They Delonte Musich continue to follow.   For questions or updates, please contact [facility] Please consult [email or web address] for contact info under Cardiology/STEMI.  Signed, Cerys Friar, PA-C  02/10/2019, 8:03 AM   I have seen and examined this patient with Oda Kilts.  Agree with above, note added to reflect my findings.  On exam, RRR, 3 out of 6 systolic murmur at the base, lungs clear.  Patient is now status post Medtronic dual-chamber pacemaker.  Device functioning appropriately.  Chest x-ray and interrogation without issue.  We Santosha Jividen arrange for follow-up in EP clinic.  Jeffry Vogelsang M. Danta Baumgardner MD 02/10/2019 10:44 AM

## 2019-02-11 ENCOUNTER — Encounter: Payer: Self-pay | Admitting: Internal Medicine

## 2019-02-11 ENCOUNTER — Telehealth: Payer: Self-pay | Admitting: Internal Medicine

## 2019-02-11 ENCOUNTER — Other Ambulatory Visit: Payer: Self-pay | Admitting: Internal Medicine

## 2019-02-11 DIAGNOSIS — Z950 Presence of cardiac pacemaker: Secondary | ICD-10-CM

## 2019-02-11 LAB — GLUCOSE, CAPILLARY
Glucose-Capillary: 121 mg/dL — ABNORMAL HIGH (ref 70–99)
Glucose-Capillary: 147 mg/dL — ABNORMAL HIGH (ref 70–99)
Glucose-Capillary: 198 mg/dL — ABNORMAL HIGH (ref 70–99)
Glucose-Capillary: 184 mg/dL — ABNORMAL HIGH (ref 70–99)
Glucose-Capillary: 239 mg/dL — ABNORMAL HIGH (ref 70–99)

## 2019-02-11 LAB — BASIC METABOLIC PANEL
Anion gap: 7 (ref 5–15)
BUN: 19 mg/dL (ref 8–23)
CO2: 29 mmol/L (ref 22–32)
Calcium: 8.4 mg/dL — ABNORMAL LOW (ref 8.9–10.3)
Chloride: 104 mmol/L (ref 98–111)
Creatinine, Ser: 1.35 mg/dL — ABNORMAL HIGH (ref 0.44–1.00)
GFR calc Af Amer: 44 mL/min — ABNORMAL LOW (ref >60)
GFR calc non Af Amer: 38 mL/min — ABNORMAL LOW (ref >60)
Glucose, Bld: 139 mg/dL — ABNORMAL HIGH (ref 70–99)
Potassium: 3.9 mmol/L (ref 3.5–5.1)
Sodium: 140 mmol/L (ref 135–145)

## 2019-02-11 LAB — CBC
HCT: 30.2 % — ABNORMAL LOW (ref 36.0–46.0)
Hemoglobin: 9.5 g/dL — ABNORMAL LOW (ref 12.0–15.0)
MCH: 30.1 pg (ref 26.0–34.0)
MCHC: 31.5 g/dL (ref 30.0–36.0)
MCV: 95.6 fL (ref 80.0–100.0)
Platelets: 216 10*3/uL (ref 150–400)
RBC: 3.16 MIL/uL — ABNORMAL LOW (ref 3.87–5.11)
RDW: 13.5 % (ref 11.5–15.5)
WBC: 6.7 10*3/uL (ref 4.0–10.5)
nRBC: 0 % (ref 0.0–0.2)

## 2019-02-11 LAB — SARS CORONAVIRUS 2 (TAT 6-24 HRS): SARS Coronavirus 2: NEGATIVE

## 2019-02-11 MED ORDER — POLYETHYLENE GLYCOL 3350 17 G PO PACK
17.0000 g | PACK | Freq: Two times a day (BID) | ORAL | Status: DC
  Administered 2019-02-11 – 2019-02-13 (×3): 17 g via ORAL
  Filled 2019-02-11 (×6): qty 1

## 2019-02-11 MED ORDER — SENNA 8.6 MG PO TABS
2.0000 | ORAL_TABLET | Freq: Every day | ORAL | Status: DC
  Administered 2019-02-11 – 2019-02-13 (×3): 17.2 mg via ORAL
  Filled 2019-02-11 (×4): qty 2

## 2019-02-11 MED ORDER — FUROSEMIDE 40 MG PO TABS
40.0000 mg | ORAL_TABLET | Freq: Once | ORAL | Status: AC
  Administered 2019-02-11: 40 mg via ORAL
  Filled 2019-02-11: qty 1

## 2019-02-11 MED ORDER — METOPROLOL SUCCINATE ER 25 MG PO TB24
25.0000 mg | ORAL_TABLET | Freq: Every day | ORAL | Status: DC
  Administered 2019-02-11 – 2019-02-14 (×4): 25 mg via ORAL
  Filled 2019-02-11 (×4): qty 1

## 2019-02-11 NOTE — NC FL2 (Signed)
[place] LEVEL OF CARE SCREENING TOOL     IDENTIFICATION  Patient Name: [name] Birthdate: 1945/11/27 Sex: female Admission Date (Current Location): 02/07/2019  [place] and [place] Number:  [name] [id number] [place] and Address:  [facility] [place], [place] [address] [place], [place] [address]      Provider Number: [id number]  Attending Physician Name and Address:  Audrey Ochs, [name]  Relative Name and Phone Number:  [name], [phone]    Current Level of Care: Hospital Recommended Level of Care: [place] Prior Approval Number:    Date Approved/Denied:   PASRR Number: [id number]  Discharge Plan: SNF    Current Diagnoses: Patient Active Problem List   Diagnosis Date Noted  . Complete heart block ([place])   . Acute on chronic diastolic (congestive) heart failure ([place]) 02/07/2019  . Acute on chronic diastolic CHF (congestive heart failure) ([place]) 06/22/2017  . Peripheral arterial disease ([place]) 06/18/2017  . B12 deficiency 05/01/2017  . Edema of left lower extremity 11/08/2015  . Aortic atherosclerosis ([place]) 06/07/2014  . Abnormality of gait 05/29/2012  . HOCM (hypertrophic obstructive cardiomyopathy) ([place]) 06/11/2011  . Acute on chronic renal failure ([place]) 05/25/2011  . Routine health maintenance 06/10/2010  . Hyperlipidemia   . Refusal of blood transfusions as patient is Jehovah's Witness   . History of adenocarcinoma of breast   . Osteoporosis   . Status post CVA   . Aortic stenosis s/p Tissure AVR 2006   . CAD (coronary artery disease)   . Hypertension   . Depression 01/23/2006  . DM (diabetes mellitus) ([place]) 03/25/1990  . CKD (chronic kidney disease), stage III 1992  . Hypokalemia 1992  . Bradycardia 1992    Orientation RESPIRATION BLADDER Height & Weight     Self, Time, Situation, Place  Normal Incontinent, External catheter Weight: 169 lb 12.1 oz (77 kg) Height:  5' (152.4 cm)   BEHAVIORAL SYMPTOMS/MOOD NEUROLOGICAL BOWEL NUTRITION STATUS      Continent Diet(Heart Healthy diet, thin liquids)  AMBULATORY STATUS COMMUNICATION OF NEEDS Skin   Extensive Assist Verbally Normal, Skin abrasions(dry skin, abrasion on left side of back)                       Personal Care Assistance Level of Assistance  Bathing, Feeding, Dressing Bathing Assistance: Limited assistance Feeding assistance: Independent Dressing Assistance: Limited assistance     Functional Limitations Info  Sight, Hearing, Speech Sight Info: Adequate Hearing Info: Adequate Speech Info: Adequate    SPECIAL CARE FACTORS FREQUENCY  PT (By licensed PT), OT (By licensed OT)     PT Frequency: 5x/wk OT Frequency: 5x/wk            Contractures Contractures Info: Not present    Additional Factors Info  Code Status, Allergies, Psychotropic, Insulin Sliding Scale Code Status Info: Full Code Allergies Info: No known allergies Psychotropic Info: paxil $RemoveBef'30mg'ZEIVfJHQBt$  daily and senokot 17.$RemoveBefore'2mg'[id number]$  2 tablets daily Insulin Sliding Scale Info: insulin aspart novolog 0-15 units 3x daily w/meals and insulin aspart 0-5 units daily at bedtime       Current Medications (02/11/2019):  This is the current hospital active medication list Current Facility-Administered Medications  Medication Dose Route Frequency Provider Last Rate Last Admin  . 0.9 %  sodium chloride infusion  250 mL Intravenous PRN Audrey Mocha, [name] 10 mL/hr at 02/09/19 2005 250 mL at 02/09/19 2005  . acetaminophen (TYLENOL) tablet 500-1,000 mg  500-1,000 mg Oral Q4H  PRN Audrey Noble, [name]   650 mg at 02/11/19 1047  . aspirin EC tablet 81 mg  81 mg Oral Daily Audrey Payment, [name]   81 mg at 02/11/19 0831  . atorvastatin (LIPITOR) tablet 80 mg  80 mg Oral q1800 Audrey Payment, [name]   80 mg at 02/10/19 1716  . enoxaparin (LOVENOX) injection 30 mg  30 mg Subcutaneous Q24H [name]   30 mg at 02/10/19 2051  . hydrALAZINE (APRESOLINE)  injection 10 mg  10 mg Intravenous Q6H PRN [name]   10 mg at 02/08/19 1249  . insulin aspart (novoLOG) injection 0-15 Units  0-15 Units Subcutaneous TID WC [name]   3 Units at 02/11/19 1234  . insulin aspart (novoLOG) injection 0-5 Units  0-5 Units Subcutaneous QHS [name]   2 Units at 02/11/19 0132  . ondansetron (ZOFRAN) injection 4 mg  4 mg Intravenous Q6H PRN [name]      . PARoxetine (PAXIL) tablet 30 mg  30 mg Oral Daily Audrey Payment, [name]   30 mg at 02/11/19 8590  . polyethylene glycol (MIRALAX / GLYCOLAX) packet 17 g  17 g Oral BID [name]      . senna (SENOKOT) tablet 17.2 mg  2 tablet Oral Daily [name]   17.2 mg at 02/11/19 1235  . sodium chloride flush (NS) 0.9 % injection 3 mL  3 mL Intravenous Q12H [name]   3 mL at 02/11/19 0836  . sodium chloride flush (NS) 0.9 % injection 3 mL  3 mL Intravenous Q12H Audrey Mocha, [name]   3 mL at 02/10/19 2200  . sodium chloride flush (NS) 0.9 % injection 3 mL  3 mL Intravenous PRN Audrey Mocha, [name]         Discharge Medications: Please see discharge summary for a list of discharge medications.  Relevant Imaging Results:  Relevant Lab Results:   Additional Information SSN: [id number]; COVID negative on 02/08/2019  [name] B [name], [name]

## 2019-02-11 NOTE — Care Management Important Message (Signed)
Important Message  Patient Details  Name: [name] MRN: [id number] Date of Birth: Jul 15, 1945   Medicare Important Message Given:  Yes     Shelda Altes 02/11/2019, 12:10 PM

## 2019-02-11 NOTE — Progress Notes (Signed)
Physical Therapy Treatment Patient Details Name: [name] MRN: [id number] DOB: [birth year] Today's Date: 02/11/2019    History of Present Illness [name] is a 73 y.o. female with medical history significant of hypertension, diabetes mellitus, coronary artery disease status post CABG with AVR in 2006-complicated by CVA, CKD stage III, insulin-dependent diabetes mellitus, hyperlipidemia, chronic diastolic congestive heart failure, history of adenocarcinoma of breast status post mastectomy in 1996, depression, arthritis presents to emergency department due to worsening shortness of breath and leg swelling since 4 days. 12/16 pacemaker placement    PT Comments    Patient seen for mobility progression. Pt requires mod/max A +2 for sit to stand/stand pivot transfers and min/mod A for gait distance of 20 ft. Pt will continue to benefit from further skilled PT services in both acute and post acute settings to maximize independence and safety with mobility.     Follow Up Recommendations  CIR;Supervision/Assistance - 24 hour     Equipment Recommendations  None recommended by PT(TBD at next venue)    Recommendations for Other Services Rehab consult     Precautions / Restrictions Precautions Precautions: Fall;ICD/Pacemaker Precaution Comments: residual L sided weakness from previous stroke Restrictions Weight Bearing Restrictions: No    Mobility  Bed Mobility Overal bed mobility: Needs Assistance Bed Mobility: Supine to Sit     Supine to sit: Max assist;HOB elevated     General bed mobility comments: cues for sequencing; assist to bring bilat LE/hips to EOB and to elevate trunk into sitting   Transfers Overall transfer level: Needs assistance Equipment used: Rolling walker (2 wheeled) Transfers: Sit to/from [facility] Sit to Stand: Max assist;+2 physical assistance;From elevated surface;Mod assist Stand pivot transfers: Mod assist;+2 physical  assistance;+2 safety/equipment       General transfer comment: pt unable to stand fully upright; cues for positioning and hand placement and assist to maximize L knee flexion prior to standing each trial; +2 assist to power up into standing from elevated bed height and from [facility]; assist required for managing RW and for balance when pivoting   Ambulation/Gait Ambulation/Gait assistance: Min assist;Mod assist;+2 safety/equipment Gait Distance (Feet): 20 Feet Assistive device: Rolling walker (2 wheeled) Gait Pattern/deviations: Step-to pattern;Decreased step length - right;Decreased step length - left;Trunk flexed Gait velocity: decreased   General Gait Details: cues for posture and assistance for balance; pt with very flexed posture throughout; posterior bias witih attempting upright posture   Stairs             Wheelchair Mobility    Modified Rankin (Stroke Patients Only)       Balance Overall balance assessment: Needs assistance Sitting-balance support: Bilateral upper extremity supported;Feet supported Sitting balance-Leahy Scale: Poor Sitting balance - Comments: increased posterior bias this session   Standing balance support: Bilateral upper extremity supported Standing balance-Leahy Scale: Poor                              Cognition Arousal/Alertness: Awake/alert Behavior During Therapy: WFL for tasks assessed/performed Overall Cognitive Status: Within Functional Limits for tasks assessed                                        Exercises      General Comments        Pertinent Vitals/Pain Pain Assessment: Faces Faces Pain Scale: Hurts a little  bit Pain Location: pacemaker site with mobility Pain Descriptors / Indicators: Guarding;Sore Pain Intervention(s): Limited activity within patient's tolerance;Monitored during session;Repositioned    Home Living                      Prior Function            PT Goals  (current goals can now be found in the care plan section) Acute Rehab PT Goals Patient Stated Goal: get stronger Progress towards PT goals: Progressing toward goals    Frequency    Min 3X/week      PT Plan Current plan remains appropriate    Co-evaluation              AM-PAC PT "6 Clicks" Mobility   Outcome Measure  Help needed turning from your back to your side while in a flat bed without using bedrails?: A Lot Help needed moving from lying on your back to sitting on the side of a flat bed without using bedrails?: A Lot Help needed moving to and from a bed to a chair (including a wheelchair)?: A Lot Help needed standing up from a chair using your arms (e.g., wheelchair or bedside chair)?: A Lot Help needed to walk in hospital room?: A Lot Help needed climbing 3-5 steps with a railing? : Total 6 Click Score: 11    End of Session Equipment Utilized During Treatment: Gait belt Activity Tolerance: Patient tolerated treatment well Patient left: with call bell/phone within reach;in chair;with chair alarm set Nurse Communication: Mobility status;Need for lift equipment PT Visit Diagnosis: Unsteadiness on feet (R26.81);Difficulty in walking, not elsewhere classified (R26.2)     Time: 5686-1683 PT Time Calculation (min) (ACUTE ONLY): 42 min  Charges:  $Gait Training: 23-37 mins $Therapeutic Activity: 8-22 mins                     Earney Navy, PTA Acute Rehabilitation Services Pager: [phone] Office: [phone]     Darliss Cheney 02/11/2019, 2:29 PM

## 2019-02-11 NOTE — TOC Progression Note (Signed)
Transition of Care [facility]) - Progression Note    Patient Details  Name: [name] MRN: [id number] Date of Birth: [birth year]  Transition of [facility] CM/SW [place], [place] Phone Number: 02/11/2019, 12:49 PM  Clinical Narrative:     Patient was worked up for CIR(inpatient rehab). CIR does not have any beds available for the next few days and the patient is medically ready. Per Pamala Hurry from [facility], family is agreeable to SNF.   CSW called and spoke with the patient's daughter, [name]. CSW explained SNF process and she gave permission to fax the patient out. CSW provided CMS SNF List via email. CSW will provide bed offers to patient's daughter.   CSW will continue to follow and assist with disposition planning.   Expected Discharge Plan: Skilled Nursing Facility Barriers to Discharge: SNF Pending bed offer  Expected Discharge Plan and Services Expected Discharge Plan: [place] Choice: [place] arrangements for the past 2 months: Single Family Home                                       Social Determinants of Health (SDOH) Interventions    Readmission Risk Interventions No flowsheet data found.

## 2019-02-11 NOTE — Telephone Encounter (Signed)
error 

## 2019-02-11 NOTE — Progress Notes (Signed)
Subjective: HD#4 Events Overnight: No events.  Patient was seen this morning on rounds.  She states that she is doing well and feeling much better.  She denies any new complaints at this time including chest pain, shortness of breath, fatigue.  I spoke to her regarding the importance of physical therapy prior to TAVR surgery.  Patient was unable to be transferred to CIR due to bed shortage.  She is agreeable to SNF placement at this time.  Objective:  Vital signs in last 24 hours: Vitals:   02/11/19 0703 02/11/19 0703 02/11/19 0704 02/11/19 0705  BP: (!) 134/59     Pulse: 67  69 67  Resp: 15  (!) 9 17  Temp:  98.4 F (36.9 C)    TempSrc:  Oral    SpO2: 96%  96% 96%  Weight:      Height:        Physical Exam: Physical Exam  Constitutional: She is oriented to person, place, and time and well-developed, well-nourished, and in no distress.  HENT:  Head: Normocephalic and atraumatic.  Eyes: EOM are normal.  Cardiovascular: Normal rate and intact distal pulses.  Murmur heard. Pulmonary/Chest: Effort normal and breath sounds normal. No respiratory distress. She exhibits no tenderness.  Abdominal: Soft. Bowel sounds are normal. She exhibits no distension. There is no abdominal tenderness.  Musculoskeletal:        General: Edema present. No tenderness. Normal range of motion.     Cervical back: Normal range of motion.  Neurological: She is alert and oriented to person, place, and time.  Skin: Skin is warm and dry.    Filed Weights   02/09/19 0439 02/10/19 0554 02/11/19 0533  Weight: 77.7 kg 77.2 kg 77 kg     Intake/Output Summary (Last 24 hours) at 02/11/2019 0740 Last data filed at 02/10/2019 2050 Gross per 24 hour  Intake 840 ml  Output 900 ml  Net -60 ml    Pertinent labs/Imaging: CBC Latest Ref Rng & Units 02/11/2019 02/10/2019 02/09/2019  WBC 4.0 - 10.5 K/uL 6.7 6.6 -  Hemoglobin 12.0 - 15.0 g/dL 9.5(L) 9.5(L) 9.2(L)  Hematocrit 36.0 - 46.0 % 30.2(L) 29.1(L)  27.0(L)  Platelets 150 - 400 K/uL 216 187 -    CMP Latest Ref Rng & Units 02/11/2019 02/10/2019 02/09/2019  Glucose 70 - 99 mg/dL 139(H) 156(H) -  BUN 8 - 23 mg/dL 19 21 -  Creatinine 0.44 - 1.00 mg/dL 1.35(H) 1.35(H) -  Sodium 135 - 145 mmol/L 140 143 142  Potassium 3.5 - 5.1 mmol/L 3.9 3.8 4.0  Chloride 98 - 111 mmol/L 104 104 -  CO2 22 - 32 mmol/L 29 28 -  Calcium 8.9 - 10.3 mg/dL 8.4(L) 8.4(L) -  Total Protein 6.5 - 8.1 g/dL - - -  Total Bilirubin 0.3 - 1.2 mg/dL - - -  Alkaline Phos 38 - 126 U/L - - -  AST 15 - 41 U/L - - -  ALT 0 - 44 U/L - - -    Assessment/Plan:  Principal Problem:   Acute on chronic diastolic CHF (congestive heart failure) (HCC) Active Problems:   DM (diabetes mellitus) (HCC)   Hyperlipidemia   Aortic stenosis s/p Tissure AVR 2006   CAD (coronary artery disease)   Hypertension   CKD (chronic kidney disease), stage III   Hypokalemia   Bradycardia   Acute on chronic diastolic (congestive) heart failure (HCC)   Complete heart block ([place])   Patient Summary: [name] is  a 73 y.o. with pertinent PMH of HFpEF s/p bioprothestic AVT (2006), CAD s/p CABG, CKD stage II, admit for acute on chronic HFpEF 2/2 to severe aortic valve stenosis on hospital day 4   #Acute on chronic HFpEF: #Severe bioprosthetic aortic valve stenosis and mitral valve stenosis: - CT scan performed yesterday. - Restart diuretics today per cardiology - [facility], EP, and CT recommendations and assistance.  #Thrid-degree heart block: - s/p pacemaker placement   #CKD Stage II: - Stable renal function after CT scan with contrast.  #DM: - Continue SSI  #HTN: - Continue HTN medications  Diet: Heart health IVF: none VTE: SCDs Code: Full  Dispo: Anticipated discharge today or tomorrow pending SNF placement. Patient is stable for discharge.   Marianna Payment, D.O. [facility], PGY-1 Date 02/11/2019 Time 7:40 AM Pager: [phone]

## 2019-02-11 NOTE — Progress Notes (Signed)
Inpatient Rehabilitation Admissions Coordinator  Inpatient rehab consult received. I met with patient at bedside and spoke with her daughter, [name], by phone. Pt could benefit from an inpt rehab admission but bed is not available for this patient over the next 3 days. I spoke with Dr. Marianna Payment and pt is medically ready for d/c. [name] wishes to pursue SNF and would like to speak to SW today. I have alerted RN CM ,Neoma Laming as well as SW, [place]. I will follow up on Monday if pt remains in house.  Danne Baxter, RN, MSN Rehab Admissions Coordinator [phone] 02/11/2019 12:29 PM

## 2019-02-11 NOTE — Progress Notes (Signed)
Internal Medicine Attending Note:  I have seen and evaluated this patient and I have discussed the plan of care with the house staff. Please see their note for complete details. I concur with their findings.  Medically stable for discharge pending SNF placement.   Velna Ochs, MD 02/11/2019, 4:05 PM

## 2019-02-11 NOTE — Telephone Encounter (Signed)
Per Pecolia Ades, patient has a TOC appt with Dr. Margaretann Loveless on 02/28/19 at 8:20am

## 2019-02-11 NOTE — Progress Notes (Signed)
Progress Note  Patient Name: [name] Date of Encounter: 02/11/2019  Primary Cardiologist: [name], MD   Subjective   Feeling well, no complaints.   Inpatient Medications    Scheduled Meds:  aspirin EC  81 mg Oral Daily   atorvastatin  80 mg Oral q1800   enoxaparin (LOVENOX) injection  30 mg Subcutaneous Q24H   insulin aspart  0-15 Units Subcutaneous TID WC   insulin aspart  0-5 Units Subcutaneous QHS   PARoxetine  30 mg Oral Daily   polyethylene glycol  17 g Oral BID   senna  2 tablet Oral Daily   sodium chloride flush  3 mL Intravenous Q12H   sodium chloride flush  3 mL Intravenous Q12H   Continuous Infusions:  sodium chloride 250 mL (02/09/19 2005)   PRN Meds: sodium chloride, acetaminophen, hydrALAZINE, ondansetron (ZOFRAN) IV, sodium chloride flush   Vital Signs    Vitals:   02/11/19 0703 02/11/19 0703 02/11/19 0704 02/11/19 0705  BP: (!) 134/59     Pulse: 67  69 67  Resp: 15  (!) 9 17  Temp:  98.4 F (36.9 C)    TempSrc:  Oral    SpO2: 96%  96% 96%  Weight:      Height:        Intake/Output Summary (Last 24 hours) at 02/11/2019 1407 Last data filed at 02/11/2019 1240 Gross per 24 hour  Intake 613 ml  Output 950 ml  Net -337 ml   Last 3 Weights 02/11/2019 02/10/2019 02/09/2019  Weight (lbs) 169 lb 12.1 oz 170 lb 3.1 oz 171 lb 4.8 oz  Weight (kg) 77 kg 77.2 kg 77.7 kg      Telemetry    V paced rhythm - Personally Reviewed  ECG    A sense V paced- Personally Reviewed  Physical Exam    Constitutional: No acute distress Eyes: sclera non-icteric, normal conjunctiva and lids ENMT: normal dentition, moist mucous membranes Cardiovascular: regular rhythm, normal rate, 3/6 SEM late peaking obscures S2. Radial pulses normal bilaterally. No jugular venous distention.  Respiratory: clear to auscultation bilaterally GI : normal bowel sounds, soft and nontender. No distention.   MSK: extremities warm, well perfused. No  edema.  NEURO: grossly nonfocal exam, moves all extremities. PSYCH: alert and oriented x 3, normal mood and affect.   Labs    High Sensitivity Troponin:   Recent Labs  Lab 02/07/19 1323 02/07/19 1626  TROPONINIHS 24* 26*      Chemistry Recent Labs  Lab 02/07/19 1323 02/07/19 1527 02/09/19 0508 02/09/19 1104 02/10/19 0431 02/11/19 0419  NA 143  --  141 142 143 140  K 3.3*  --  3.9 4.0 3.8 3.9  CL 110  --  104  --  104 104  CO2 23   < > 26  --  28 29  GLUCOSE 203*  --  169*  --  156* 139*  BUN 25*  --  26*  --  21 19  CREATININE 1.48*  --  1.79*  --  1.35* 1.35*  CALCIUM 8.2*   < > 8.7*  --  8.4* 8.4*  PROT 6.5  --   --   --   --   --   ALBUMIN 3.1*  --   --   --   --   --   AST 13*  --   --   --   --   --   ALT 12  --   --   --   --   --  ALKPHOS 96  --   --   --   --   --   BILITOT 1.0  --   --   --   --   --   GFRNONAA 34*   < > 27*  --  38* 38*  GFRAA 40*   < > 32*  --  44* 44*  ANIONGAP 10   < > 11  --  11 7   < > = values in this interval not displayed.     Hematology Recent Labs  Lab 02/07/19 1323 02/09/19 1104 02/10/19 0431 02/11/19 0419  WBC 11.7*  --  6.6 6.7  RBC 3.60*  --  3.07* 3.16*  HGB 11.0* 9.2* 9.5* 9.5*  HCT 34.4* 27.0* 29.1* 30.2*  MCV 95.6  --  94.8 95.6  MCH 30.6  --  30.9 30.1  MCHC 32.0  --  32.6 31.5  RDW 14.0  --  13.6 13.5  PLT 207  --  187 216    BNP Recent Labs  Lab 02/07/19 1323  BNP 2,131.7*     DDimer No results for input(s): DDIMER in the last 168 hours.   Radiology    DG Chest 2 View  Result Date: 02/10/2019 CLINICAL DATA:  Cardiac device placement EXAM: CHEST - 2 VIEW COMPARISON:  Three days ago FINDINGS: New dual-chamber pacer from the right. There is a loop at the level of the right ventricular catheter. Right atrial catheter with tip over the atrial appendage. No pneumothorax or new mediastinal widening. CABG and aortic valve replacement. Cardiomegaly. Improved or resolved pulmonary edema. IMPRESSION: 1.  New dual-chamber pacer implant. 2. Improved or resolved pulmonary edema. Electronically Signed   By: Monte Fantasia M.D.   On: 02/10/2019 07:42   CT CORONARY MORPH W/CTA COR W/SCORE W/CA W/CM &/OR WO/CM  Addendum Date: 02/10/2019   ADDENDUM REPORT: 02/10/2019 16:53 CLINICAL DATA:  Bioprosthetic aortic valve stenosis EXAM: Cardiac TAVR CT TECHNIQUE: The patient was scanned on a Siemens Force 092 slice scanner. A 120 kV retrospective scan was triggered in the descending thoracic aorta at 111 HU's. Gantry rotation speed was 270 msecs and collimation was .9 mm. No beta blockade or nitro were given. The 3D data set was reconstructed in 5% intervals of the R-R cycle. Systolic and diastolic phases were analyzed on a dedicated work station using MPR, MIP and VRT modes. The patient received 149mL OMNIPAQUE IOHEXOL 350 MG/ML SOLN of contrast. FINDINGS: Aortic Valve: Bioprosthetic aortic valve. 21 mm Magna Ease pericardial prosthesis, 2006. Moderately restricted leaflet motion with moderately calcified leaflets. Valve measures 20 x 20 mm. Moderately calcified leaflets. Posterior leaflet of the valve has restricted motion. No LVOT calcifications. Aorta: Mild calcifications in the thoracic aorta. Normal 3 vessel branch pattern of the aortic arch. Sinotubular Junction: 28 mm Ascending Thoracic Aorta: 37 mm Aortic Arch: 32 mm Descending Thoracic Aorta: 25 mm Coronary Artery Height above Annulus: Left Main: 8.9 mm Right Coronary: 2.7 mm The leaflet of the bioprosthesis adjacent to the left main coronary origin has restricted motion. The left coronary sinus is large, and coronary ostium distance is adequate. The leaflet is unlikely to obstruct the coronary ostium. The right coronary artery arises low on the right coronary sinus. It is adjacent to the struts of the bioprosthetic valve. It is known to be occluded and there is a patent SVG-RCA graft seen on this exam. Prosthesis to LM distance - 78mm Prosthesis to RCA distance - 2  mm. The RCA is known to  be occluded and there is a patent SVG-RCA graft noted. Coronary Arteries: Three vessel coronary artery calcifications. SVG to RCA graft seen arising from mid ascending aorta. For valve in valve TAVR, this valve would be suited for a 23 mm Evolut Core Valve. OTHER: Severe mitral annular calcifications. No left atrial appendage thrombus. Normal pulmonary vein anatomy. IMPRESSION: 1. 21 mm Magna Ease bioprosthetic aortic valve with severe stenosis. Moderately reduced leaflet excursion with moderately calcified leaflets. 2.  Normal ascending aorta dimensions. 3. Adequate left coronary height and distance from prosthesis for placement of a 23 mm Evolut Core Valve in a valve in valve fashion. 4.  Severe mitral annular calcification. Electronically Signed   By: Cherlynn Kaiser   On: 02/10/2019 16:53   Result Date: 02/10/2019 EXAM: OVER-READ INTERPRETATION  CT CHEST The following report is an over-read performed by radiologist Dr. Salvatore Marvel of [facility], [place] on 02/10/2019. This over-read does not include interpretation of cardiac or coronary anatomy or pathology. The coronary CTA interpretation by the cardiologist is attached. COMPARISON:  07/08/2004 chest CT angiogram. FINDINGS: Please see the separate concurrent chest CT angiogram report for details. IMPRESSION: Please see the concurrent chest CT angiogram report for details. Electronically Signed: By: Ilona Sorrel M.D. On: 02/10/2019 15:39   CT ANGIO CHEST AORTA W/CM &/OR WO/CM  Result Date: 02/10/2019 CLINICAL DATA:  Inpatient. Aortic valve replacement and CABG 2006. Severe symptomatic aortic stenosis. Normal EF. Pre-TAVR evaluation. Remote history of left breast cancer. EXAM: CT ANGIOGRAPHY CHEST, ABDOMEN AND PELVIS TECHNIQUE: Multidetector CT imaging through the chest, abdomen and pelvis was performed using the standard protocol during bolus administration of intravenous contrast. Multiplanar reconstructed images and MIPs  were obtained and reviewed to evaluate the vascular anatomy. CONTRAST:  165mL OMNIPAQUE IOHEXOL 350 MG/ML SOLN COMPARISON:  07/08/2004 chest and abdomen CT angiogram. FINDINGS: CTA CHEST FINDINGS Cardiovascular: Mild cardiomegaly. Right subclavian 2 lead pacemaker with lead tips in the right atrium and interventricular septum. Mild subcutaneous emphysema surrounding the pacemaker device in the ventral upper right chest wall, compatible with recent device placement. No fluid collections. No significant pericardial effusion/thickening. Left main and 3 vessel coronary atherosclerosis status post CABG. Aortic valve prosthesis in place. Atherosclerotic nonaneurysmal thoracic aorta. Top-normal caliber main pulmonary artery (3.2 cm diameter). No central pulmonary emboli. Mediastinum/Nodes: No discrete thyroid nodules. Unremarkable esophagus. No pathologically enlarged axillary, mediastinal or hilar lymph nodes. Surgical clips noted in the left axilla. Lungs/Pleura: No pneumothorax. No pleural effusion. No acute consolidative airspace disease, lung masses or significant pulmonary nodules. Mosaic attenuation throughout both lungs. Musculoskeletal: No aggressive appearing focal osseous lesions. Intact sternotomy wires. Moderate thoracic spondylosis. Moderate T11 vertebral compression fracture, chronic and stable since 06/22/2017 thoracic spine radiographs. CTA ABDOMEN AND PELVIS FINDINGS Hepatobiliary: Normal liver with no liver mass. Cholecystectomy. Bile ducts are within normal post cholecystectomy limits with CBD diameter 7 mm. Pancreas: Normal, with no mass or duct dilation. Spleen: Normal size. No mass. Adrenals/Urinary Tract: Normal adrenals. Simple 4.8 cm lower right renal cyst. No additional contour deforming renal lesions. No hydronephrosis. Normal bladder. Stomach/Bowel: Small hiatal hernia. Otherwise normal stomach. Normal caliber small bowel with no small bowel wall thickening. Normal appendix. Mild sigmoid  diverticulosis, with no large bowel wall thickening or significant pericolonic fat stranding. Vascular/Lymphatic: Atherosclerotic nonaneurysmal abdominal aorta. Patent splenic and renal veins. No pathologically enlarged lymph nodes in the abdomen or pelvis. Reproductive: Status post hysterectomy, with no abnormal findings at the vaginal cuff. No adnexal mass. Other: No pneumoperitoneum, ascites or focal fluid  collection. Musculoskeletal: No aggressive appearing focal osseous lesions. Moderate lumbar spondylosis. VASCULAR MEASUREMENTS PERTINENT TO TAVR: AORTA: Minimal Aortic Diameter-14.0 x 13.8 mm Severity of Aortic Calcification-moderate RIGHT PELVIS: Right Common Iliac Artery - Minimal Diameter-8.8 x 8.6 mm Tortuosity-moderate Calcification-moderate Right External Iliac Artery - Minimal Diameter-7.7 x 7.1 mm Tortuosity-mild Calcification-mild Right Common Femoral Artery - Minimal Diameter-7.7 x 7.3 mm Tortuosity-mild Calcification-mild LEFT PELVIS: Left Common Iliac Artery - Minimal Diameter-9.6 x 9.2 mm Tortuosity-moderate Calcification-moderate Left External Iliac Artery - Minimal Diameter-6.9 x 6.8 mm Tortuosity-mild Calcification-mild Left Common Femoral Artery - Minimal Diameter-7.4 x 6.7 mm Tortuosity-mild Calcification-mild Review of the MIP images confirms the above findings. IMPRESSION: 1. Vascular findings and measurements pertinent to potential TAVR procedure, as detailed. 2. Mild cardiomegaly. Left main and 3 vessel coronary atherosclerosis status post CABG. 3. Mosaic attenuation throughout both lungs, nonspecific, which could be due to mosaic perfusion from pulmonary vascular disease versus air trapping from small airways disease. 4. Small hiatal hernia. 5. Mild sigmoid diverticulosis. 6.  Aortic Atherosclerosis (ICD10-I70.0). Electronically Signed   By: Ilona Sorrel M.D.   On: 02/10/2019 16:51   CT Angio Abd/Pel w/ and/or w/o  Result Date: 02/10/2019 CLINICAL DATA:  Inpatient. Aortic valve  replacement and CABG 2006. Severe symptomatic aortic stenosis. Normal EF. Pre-TAVR evaluation. Remote history of left breast cancer. EXAM: CT ANGIOGRAPHY CHEST, ABDOMEN AND PELVIS TECHNIQUE: Multidetector CT imaging through the chest, abdomen and pelvis was performed using the standard protocol during bolus administration of intravenous contrast. Multiplanar reconstructed images and MIPs were obtained and reviewed to evaluate the vascular anatomy. CONTRAST:  159mL OMNIPAQUE IOHEXOL 350 MG/ML SOLN COMPARISON:  07/08/2004 chest and abdomen CT angiogram. FINDINGS: CTA CHEST FINDINGS Cardiovascular: Mild cardiomegaly. Right subclavian 2 lead pacemaker with lead tips in the right atrium and interventricular septum. Mild subcutaneous emphysema surrounding the pacemaker device in the ventral upper right chest wall, compatible with recent device placement. No fluid collections. No significant pericardial effusion/thickening. Left main and 3 vessel coronary atherosclerosis status post CABG. Aortic valve prosthesis in place. Atherosclerotic nonaneurysmal thoracic aorta. Top-normal caliber main pulmonary artery (3.2 cm diameter). No central pulmonary emboli. Mediastinum/Nodes: No discrete thyroid nodules. Unremarkable esophagus. No pathologically enlarged axillary, mediastinal or hilar lymph nodes. Surgical clips noted in the left axilla. Lungs/Pleura: No pneumothorax. No pleural effusion. No acute consolidative airspace disease, lung masses or significant pulmonary nodules. Mosaic attenuation throughout both lungs. Musculoskeletal: No aggressive appearing focal osseous lesions. Intact sternotomy wires. Moderate thoracic spondylosis. Moderate T11 vertebral compression fracture, chronic and stable since 06/22/2017 thoracic spine radiographs. CTA ABDOMEN AND PELVIS FINDINGS Hepatobiliary: Normal liver with no liver mass. Cholecystectomy. Bile ducts are within normal post cholecystectomy limits with CBD diameter 7 mm. Pancreas:  Normal, with no mass or duct dilation. Spleen: Normal size. No mass. Adrenals/Urinary Tract: Normal adrenals. Simple 4.8 cm lower right renal cyst. No additional contour deforming renal lesions. No hydronephrosis. Normal bladder. Stomach/Bowel: Small hiatal hernia. Otherwise normal stomach. Normal caliber small bowel with no small bowel wall thickening. Normal appendix. Mild sigmoid diverticulosis, with no large bowel wall thickening or significant pericolonic fat stranding. Vascular/Lymphatic: Atherosclerotic nonaneurysmal abdominal aorta. Patent splenic and renal veins. No pathologically enlarged lymph nodes in the abdomen or pelvis. Reproductive: Status post hysterectomy, with no abnormal findings at the vaginal cuff. No adnexal mass. Other: No pneumoperitoneum, ascites or focal fluid collection. Musculoskeletal: No aggressive appearing focal osseous lesions. Moderate lumbar spondylosis. VASCULAR MEASUREMENTS PERTINENT TO TAVR: AORTA: Minimal Aortic Diameter-14.0 x 13.8 mm Severity of Aortic Calcification-moderate RIGHT  PELVIS: Right Common Iliac Artery - Minimal Diameter-8.8 x 8.6 mm Tortuosity-moderate Calcification-moderate Right External Iliac Artery - Minimal Diameter-7.7 x 7.1 mm Tortuosity-mild Calcification-mild Right Common Femoral Artery - Minimal Diameter-7.7 x 7.3 mm Tortuosity-mild Calcification-mild LEFT PELVIS: Left Common Iliac Artery - Minimal Diameter-9.6 x 9.2 mm Tortuosity-moderate Calcification-moderate Left External Iliac Artery - Minimal Diameter-6.9 x 6.8 mm Tortuosity-mild Calcification-mild Left Common Femoral Artery - Minimal Diameter-7.4 x 6.7 mm Tortuosity-mild Calcification-mild Review of the MIP images confirms the above findings. IMPRESSION: 1. Vascular findings and measurements pertinent to potential TAVR procedure, as detailed. 2. Mild cardiomegaly. Left main and 3 vessel coronary atherosclerosis status post CABG. 3. Mosaic attenuation throughout both lungs, nonspecific, which  could be due to mosaic perfusion from pulmonary vascular disease versus air trapping from small airways disease. 4. Small hiatal hernia. 5. Mild sigmoid diverticulosis. 6.  Aortic Atherosclerosis (ICD10-I70.0). Electronically Signed   By: Ilona Sorrel M.D.   On: 02/10/2019 16:51    Cardiac Studies  Echo 02/08/19  1. Left ventricular ejection fraction, by visual estimation, is 70 to 75%. The left ventricle has hyperdynamic function. There is moderately increased left ventricular hypertrophy.  2. Abnormal septal motion consistent with post-operative status.  3. Elevated left atrial pressure.  4. The left ventricle has no regional wall motion abnormalities.  5. Global right ventricle has normal systolic function.The right ventricular size is normal. No increase in right ventricular wall thickness.  6. Left atrial size was severely dilated.  7. Right atrial size was mildly dilated.  8. Severe mitral annular calcification.  9. The mitral valve is degenerative. Mild mitral valve regurgitation. Moderate-severe mitral stenosis. 10. MV mean gradient, 10 mmHg at 45 bpm. Calculated area is 1.4 cm sq by PHT and 1.1 cm sq by continuity equation. 11. Severe aortic valve bioprosthesis stenosis. Mean gradient is 55 mm Hg, dimensionless obstructive index 0.24. 12. Aortic valve regurgitation is mild. 13. The tricuspid valve is normal in structure. Tricuspid valve regurgitation is mild. 14. Pulmonic regurgitation is mild. 15. The pulmonic valve was grossly normal. Pulmonic valve regurgitation is mild. 16. Severely elevated pulmonary artery systolic pressure. 17. The tricuspid regurgitant velocity is 3.97 m/s, and with an assumed right atrial pressure of 15 mmHg, the estimated right ventricular systolic pressure is severely elevated at 78.0 mmHg. 18. The inferior vena cava is dilated in size with <50% respiratory variability, suggesting right atrial pressure of 15 mmHg.  R/LHC 12/16  1.  Severe single-vessel  coronary artery disease with total occlusion of the mid RCA and patency of the saphenous vein graft PDA 2.  Patent left main, LAD, and left circumflex with mild diffuse irregularity but no significant stenoses 3.  Known critical bioprosthetic aortic stenosis, valve not crossed during this procedure 4.  Mild pulmonary hypertension with mean PA pressure 26 mmHg, secondary to left heart disease (pulmonary capillary wedge pressure mean 25 mmHg)  Plan: Multidisciplinary heart valve team review, EP consultation for consideration of permanent pacemaker in this patient with complete heart block and left bundle branch block and pending TAVR valve in valve.  Patient Profile     SHALOM WARE is a 73 y.o. female with a history of CAD s/p CABG (SVG-RCA) with AVR (4174) course complicated by CVA (presumed embolic), CKD stage 2, IDDM, HLD, HTN, chronic diastolic heart failure, and hx of breast cancer treated with tamoxifen and mastectomy (1996), no radiation.  Assessment & Plan    Principal Problem:   Acute on chronic diastolic CHF (congestive heart failure) ([place])  Active Problems:   DM (diabetes mellitus) ([place])   Hyperlipidemia   Aortic stenosis s/p Tissure AVR 2006   CAD (coronary artery disease)   Hypertension   Constipation   CKD (chronic kidney disease), stage III   Hypokalemia   Bradycardia   Acute on chronic diastolic (congestive) heart failure ([facility])   Complete heart block ([place])  1.  Very severe bioprosthetic aortic valve stenosis-seen by Dr. Cyndia Bent yesterday after TAVR CT.  TAVR CT shows adequate coronary height and distance on the left for valve in valve TAVR with a [employer]tronic evolut 23 mm valve.  This is a supra annular valve and may be well suited to her small surgical prosthesis.  Her right coronary artery arises very low on the annulus and is adjacent to the surgical valve struts there is a patent SVG to RCA seen on the TAVR CT.  Creatinine is stable after TAVR CT.  2.  Complete heart  block status post permanent pacemaker implantation.  Chest x-ray reviewed.  She feels much better today after pacing.  Her rate is approximately 80 and device appears to be functioning appropriately.  3.  Acute on chronic diastolic heart failure- her volume status has improved and she appears much better after diuresis and pacing. Will proceed cautiously with diuresis.  -lasix 40 mg po today, and reassess Cr tomorrow  4.  Moderate to severe mitral stenosis with severe mitral annular calcification-she is minimally ambulatory at baseline but does walk with a walker to the bathroom at home. A lower HR will likely be best to address her mitral stenosis. This can be adjusted as symptoms are assessed with our colleagues in [place] as needed.  5.  Hypertension-Home medications include amlodipine 10 mg daily hydralazine 50 mg 3 times daily metoprolol succinate 25 mg daily.  We will obtain better control of her blood pressure, however being very cautious to avoid hypotension in the setting of the very severely stenotic aortic valve.  - will restart metoprolol succ 25 mg daily  - will add amlodipine 5 mg daily tomorrow IF needed for BP and doing well.  - hold hydralazine in setting of severe AS.   6.  CAD status post one-vessel CABG at the time of AVR 2006-she is on aspirin, statin, home beta-blocker which was held in the setting of complete heart block.  Disease has not significantly changed on cath yesterday.  7.  Mild pulmonary hypertension secondary to left heart disease-likely needs gentle diuresis with an eye towards her renal function, and management of her valve disease. Lasix as above.      For questions or updates, please contact [place] Please consult [email or web address] for contact info under        Signed, Elouise Munroe, MD  02/11/2019, 2:07 PM

## 2019-02-11 NOTE — Discharge Summary (Addendum)
Name: [name] MRN: [id number] DOB: 18-Mar-1945 73 y.o. PCP: Burns Spain, MD  Date of Admission: 02/07/2019  1:06 PM Date of Discharge: 02/14/19 Attending Physician: Reymundo Poll, MD  Discharge Diagnosis: 1. Acute on Chronic HFpEF secondary to severe bioprosthetic aortic valve stenosis complicated by third degree heart block.  Discharge Medications: Allergies as of 02/13/2019      Reactions   Other Other (See Comments)   NO "blood products," as the patient is a Jehovah's Witness      Medication List    STOP taking these medications   hydrALAZINE 50 MG tablet Commonly known as: APRESOLINE     TAKE these medications   Accu-Chek FastClix Lancets Misc Use to check blood sugar 4 times a day. diag code E11.22. insulin dependent   acetaminophen 500 MG tablet Commonly known as: TYLENOL Take 2 tablets (1,000 mg total) by mouth every 6 (six) hours as needed.   amLODipine 5 MG tablet Commonly known as: NORVASC Take 1 tablet (5 mg total) by mouth daily. What changed:   medication strength  how much to take   aspirin 81 MG EC tablet TAKE 1 TABLET (81 MG TOTAL) BY MOUTH DAILY. SWALLOW WHOLE. What changed:   how much to take  how to take this  when to take this  additional instructions   atorvastatin 80 MG tablet Commonly known as: LIPITOR Take 1 tablet (80 mg total) by mouth at bedtime.   Contour Next Test test strip Generic drug: glucose blood Use to check blood sugar 4 times daily. diag code E11.22. insulin dependent   furosemide 40 MG tablet Commonly known as: LASIX Take 1 tablet (40 mg total) by mouth daily.   metoprolol succinate 25 MG 24 hr tablet Commonly known as: TOPROL-XL Take 1 tablet (25 mg total) by mouth daily.   PARoxetine 30 MG tablet Commonly known as: PAXIL TAKE 1 TABLET BY MOUTH DAILY   Pen Needles 3/16" 31G X 5 MM Misc 1 each by Does not apply route 4 (four) times daily. Use to check blood sugars 4 times a day.  Dx code E11.22   polyethylene glycol 17 g packet Commonly known as: MIRALAX / GLYCOLAX Take 17 g by mouth daily. What changed:   when to take this  reasons to take this   Xultophy 100-3.6 UNIT-MG/ML Sopn Generic drug: Insulin Degludec-Liraglutide Inject 10 Units into the skin daily.       Disposition and follow-up:   Ms.Rochelle C Ricklefs was discharged from [facility] in Stable condition.  At the hospital follow up visit please address:  1.  Follow up: Cardiology recommendations for diuretics, blood pressure medications, and surgery schedule.  2.  Labs / imaging needed at time of follow-up: CBC, CMP (Electrolyte).  3.  Pending labs/ test needing follow-up: none  Follow-up Appointments:  Contact information for follow-up providers    Regan Lemming, MD Follow up on 05/24/2019.   Specialty: Cardiology Why: at 230 for 3 month post pacemaker check Contact information: [address] [place] [address] [phone]        [facility] Follow up on 02/24/2019.   Why: at 1100 am for post pacemaker wound check Contact information: [address] [place] [address] [phone]       Parke Poisson, MD Follow up.   Specialties: Cardiology, Radiology Why: Cardiology hospital follow-up on 02/28/2019 at 8:20 AM.  Please arrive 15 minutes  early for check-in. Note: This will be at our [facility] office. Contact information: [address] [place] [address] [phone]        [facility]. Go on 02/14/2019.   Why: At 10:15am Contact information: [address] [place] [phone]           Contact information for after-discharge care    Destination    [facility] .   Service: Skilled Nursing Contact information: [address] [place]  [place] Hospital Course by problem list: 1. Acute on Chronic HFpEF - patient presented on 12/14 with exertional shortness of breath with associated LE edema, orthopnea. In the ED, the patient was found to be bradycardic, tachypneic, and hypoxic requiring 2 L [place]. Patients BNP > 2000 and CXR showing atelectasis. Echo performed showing EF of 70 to 75% with hyperdynamic function, LVH, mod-severe mitral valve stenosis, and severe bioprosthesis aortic valve stenosis. Patient assessed by the multidisciplinary heart valve team to determine best course of intervention. Determined to be a candidate for TAVR as an outpatient once ICD in place for complete heart block. Patient set up with SNF for outpatient rehab prior to surgery in early January.   2. Third degree heart block - Patient was found to be bradycardic in the ED. EKG showed complete AV block with wide QRS complex. EP was consulted. Permanent pace maker placed with resolution of her heart block/bradycardia  Discharge Vitals:   BP (!) 146/58   Pulse 67   Temp 97.9 F (36.6 C)   Resp 18   Ht 5' (1.524 m)   Wt 75.4 kg   SpO2 97%   BMI 32.46 kg/m   Pertinent Labs, Studies, and Procedures:  CBC Latest Ref Rng & Units 02/11/2019 02/10/2019 02/09/2019  WBC 4.0 - 10.5 K/uL 6.7 6.6 -  Hemoglobin 12.0 - 15.0 g/dL 9.5(L) 9.5(L) 9.2(L)  Hematocrit 36.0 - 46.0 % 30.2(L) 29.1(L) 27.0(L)  Platelets 150 - 400 K/uL 216 187 -   CMP Latest Ref Rng & Units 02/12/2019 02/11/2019 02/10/2019  Glucose 70 - 99 mg/dL 169(H) 139(H) 156(H)  BUN 8 - 23 mg/dL $Remove'22 19 21  'WEGmJqW$ Creatinine 0.44 - 1.00 mg/dL 1.38(H) 1.35(H) 1.35(H)  Sodium 135 - 145 mmol/L 138 140 143  Potassium 3.5 - 5.1 mmol/L 4.4 3.9 3.8  Chloride 98 - 111 mmol/L 101 104 104  CO2 22 - 32 mmol/L $RemoveB'28 29 28  'tMAqhFva$ Calcium 8.9 - 10.3 mg/dL 8.6(L) 8.4(L) 8.4(L)  Total Protein 6.5 - 8.1 g/dL - - -  Total Bilirubin 0.3 - 1.2 mg/dL - - -  Alkaline Phos 38 - 126 U/L - - -  AST  15 - 41 U/L - - -  ALT 0 - 44 U/L - - -    Discharge Instructions: Discharge Instructions    Diet - low sodium heart healthy   Complete by: As directed    Discharge instructions   Complete by: As directed    You were hospitalized for Heart Failure and Arhythmia. Thank you for allowing [place] to be part of your care.   We arranged for you to follow up at: see after visit summary for appointment times and locations    Please note these changes made to your medications:   Please START taking: home medications with the exception of the medications noted below   Please STOP  taking: hydralazine   Please make sure to follow up at your appointment on 12/21 at the [facility].  Please call our clinic if you have any questions or concerns, we may be able to help and keep you from a long and expensive emergency room wait. Our clinic and after hours phone number is [phone], the best time to call is Monday through Friday 9 am to 4 pm but there is always someone available 24/7 if you have an emergency. If you need medication refills please notify your pharmacy one week in advance and they will send [place] a request.   Increase activity slowly   Complete by: As directed       Signed: Al Decant, MD 02/13/2019, 10:01 AM   Pager: [phone]

## 2019-02-12 LAB — BASIC METABOLIC PANEL
Anion gap: 9 (ref 5–15)
BUN: 22 mg/dL (ref 8–23)
CO2: 28 mmol/L (ref 22–32)
Calcium: 8.6 mg/dL — ABNORMAL LOW (ref 8.9–10.3)
Chloride: 101 mmol/L (ref 98–111)
Creatinine, Ser: 1.38 mg/dL — ABNORMAL HIGH (ref 0.44–1.00)
GFR calc Af Amer: 43 mL/min — ABNORMAL LOW (ref >60)
GFR calc non Af Amer: 37 mL/min — ABNORMAL LOW (ref >60)
Glucose, Bld: 169 mg/dL — ABNORMAL HIGH (ref 70–99)
Potassium: 4.4 mmol/L (ref 3.5–5.1)
Sodium: 138 mmol/L (ref 135–145)

## 2019-02-12 LAB — CULTURE, BLOOD (ROUTINE X 2)
Culture: NO GROWTH
Culture: NO GROWTH
Special Requests: ADEQUATE
Special Requests: ADEQUATE

## 2019-02-12 LAB — GLUCOSE, CAPILLARY
Glucose-Capillary: 142 mg/dL — ABNORMAL HIGH (ref 70–99)
Glucose-Capillary: 253 mg/dL — ABNORMAL HIGH (ref 70–99)
Glucose-Capillary: 152 mg/dL — ABNORMAL HIGH (ref 70–99)
Glucose-Capillary: 181 mg/dL — ABNORMAL HIGH (ref 70–99)

## 2019-02-12 MED ORDER — HYDRALAZINE HCL 20 MG/ML IJ SOLN: 10.0000 mg | Freq: Four times a day (QID) | INTRAMUSCULAR | Status: DC | PRN

## 2019-02-12 MED ORDER — AMLODIPINE BESYLATE 5 MG PO TABS
5.0000 mg | ORAL_TABLET | Freq: Every day | ORAL | Status: DC
  Administered 2019-02-12 – 2019-02-14 (×3): 5 mg via ORAL
  Filled 2019-02-12 (×3): qty 1

## 2019-02-12 MED ORDER — FUROSEMIDE 40 MG PO TABS
40.0000 mg | ORAL_TABLET | Freq: Every day | ORAL | Status: DC
  Administered 2019-02-12 – 2019-02-14 (×3): 40 mg via ORAL
  Filled 2019-02-12 (×3): qty 1

## 2019-02-12 NOTE — Telephone Encounter (Signed)
In hospital. 

## 2019-02-12 NOTE — Progress Notes (Signed)
Progress Note  Patient Name: [name] Date of Encounter: 02/12/2019  Primary Cardiologist: [name], MD   Subjective   She continues to feel well after permanent pacemaker implantation.  Inpatient Medications    Scheduled Meds:  aspirin EC  81 mg Oral Daily   atorvastatin  80 mg Oral q1800   enoxaparin (LOVENOX) injection  30 mg Subcutaneous Q24H   insulin aspart  0-15 Units Subcutaneous TID WC   insulin aspart  0-5 Units Subcutaneous QHS   metoprolol succinate  25 mg Oral Daily   PARoxetine  30 mg Oral Daily   polyethylene glycol  17 g Oral BID   senna  2 tablet Oral Daily   sodium chloride flush  3 mL Intravenous Q12H   sodium chloride flush  3 mL Intravenous Q12H   Continuous Infusions:  sodium chloride 250 mL (02/09/19 2005)   PRN Meds: sodium chloride, acetaminophen, hydrALAZINE, ondansetron (ZOFRAN) IV, sodium chloride flush   Vital Signs    Vitals:   02/11/19 1948 02/12/19 0154 02/12/19 0631 02/12/19 0812  BP: 126/84  (!) 155/64 (!) 145/72  Pulse: 73  75 79  Resp: $Remo'20  18 18  'WYHXE$ Temp: 98.5 F (36.9 C)  98.7 F (37.1 C) 98.5 F (36.9 C)  TempSrc: Oral  Oral Oral  SpO2: 97%  95% 96%  Weight:  75.2 kg    Height:        Intake/Output Summary (Last 24 hours) at 02/12/2019 1100 Last data filed at 02/12/2019 0818 Gross per 24 hour  Intake 499 ml  Output 1525 ml  Net -1026 ml   Last 3 Weights 02/12/2019 02/11/2019 02/10/2019  Weight (lbs) 165 lb 11.2 oz 169 lb 12.1 oz 170 lb 3.1 oz  Weight (kg) 75.161 kg 77 kg 77.2 kg      Telemetry    V paced rhythm - Personally Reviewed  ECG    A sense V paced- Personally Reviewed  Physical Exam  Constitutional: No acute distress Eyes: sclera non-icteric, normal conjunctiva and lids ENMT: normal dentition, moist mucous membranes Cardiovascular: regular rhythm, normal rate, 3/6 SEM, late peaking, obscures S2.  No appreciable diastolic murmur. Radial pulses normal bilaterally. No  jugular venous distention.  Pacemaker generator pocket appears mildly swollen, with no erythema, dressings are clean, dry, and intact. Respiratory: clear to auscultation bilaterally GI : normal bowel sounds, soft and nontender. No distention.   MSK: extremities warm, well perfused. No edema.  NEURO: grossly nonfocal exam, moves all extremities. PSYCH: alert and oriented x 3, normal mood and affect.   Labs    High Sensitivity Troponin:   Recent Labs  Lab 02/07/19 1323 02/07/19 1626  TROPONINIHS 24* 26*      Chemistry Recent Labs  Lab 02/07/19 1323 02/07/19 1527 02/10/19 0431 02/11/19 0419 02/12/19 0339  NA 143  --  143 140 138  K 3.3*  --  3.8 3.9 4.4  CL 110  --  104 104 101  CO2 23   < > $R'28 29 28  'fI$ GLUCOSE 203*  --  156* 139* 169*  BUN 25*  --  $R'21 19 22  'Jr$ CREATININE 1.48*  --  1.35* 1.35* 1.38*  CALCIUM 8.2*   < > 8.4* 8.4* 8.6*  PROT 6.5  --   --   --   --   ALBUMIN 3.1*  --   --   --   --   AST 13*  --   --   --   --  ALT 12  --   --   --   --   ALKPHOS 96  --   --   --   --   BILITOT 1.0  --   --   --   --   GFRNONAA 34*   < > 38* 38* 37*  GFRAA 40*   < > 44* 44* 43*  ANIONGAP 10   < > $R'11 7 9   'rQ$ < > = values in this interval not displayed.     Hematology Recent Labs  Lab 02/07/19 1323 02/09/19 1104 02/10/19 0431 02/11/19 0419  WBC 11.7*  --  6.6 6.7  RBC 3.60*  --  3.07* 3.16*  HGB 11.0* 9.2* 9.5* 9.5*  HCT 34.4* 27.0* 29.1* 30.2*  MCV 95.6  --  94.8 95.6  MCH 30.6  --  30.9 30.1  MCHC 32.0  --  32.6 31.5  RDW 14.0  --  13.6 13.5  PLT 207  --  187 216    BNP Recent Labs  Lab 02/07/19 1323  BNP 2,131.7*     DDimer No results for input(s): DDIMER in the last 168 hours.   Radiology    CT CORONARY MORPH W/CTA COR W/SCORE W/CA W/CM &/OR WO/CM  Addendum Date: 02/10/2019   ADDENDUM REPORT: 02/10/2019 16:53 CLINICAL DATA:  Bioprosthetic aortic valve stenosis EXAM: Cardiac TAVR CT TECHNIQUE: The patient was scanned on a Siemens Force 553 slice  scanner. A 120 kV retrospective scan was triggered in the descending thoracic aorta at 111 HU's. Gantry rotation speed was 270 msecs and collimation was .9 mm. No beta blockade or nitro were given. The 3D data set was reconstructed in 5% intervals of the R-R cycle. Systolic and diastolic phases were analyzed on a dedicated work station using MPR, MIP and VRT modes. The patient received 111mL OMNIPAQUE IOHEXOL 350 MG/ML SOLN of contrast. FINDINGS: Aortic Valve: Bioprosthetic aortic valve. 21 mm Magna Ease pericardial prosthesis, 2006. Moderately restricted leaflet motion with moderately calcified leaflets. Valve measures 20 x 20 mm. Moderately calcified leaflets. Posterior leaflet of the valve has restricted motion. No LVOT calcifications. Aorta: Mild calcifications in the thoracic aorta. Normal 3 vessel branch pattern of the aortic arch. Sinotubular Junction: 28 mm Ascending Thoracic Aorta: 37 mm Aortic Arch: 32 mm Descending Thoracic Aorta: 25 mm Coronary Artery Height above Annulus: Left Main: 8.9 mm Right Coronary: 2.7 mm The leaflet of the bioprosthesis adjacent to the left main coronary origin has restricted motion. The left coronary sinus is large, and coronary ostium distance is adequate. The leaflet is unlikely to obstruct the coronary ostium. The right coronary artery arises low on the right coronary sinus. It is adjacent to the struts of the bioprosthetic valve. It is known to be occluded and there is a patent SVG-RCA graft seen on this exam. Prosthesis to LM distance - 40mm Prosthesis to RCA distance - 2 mm. The RCA is known to be occluded and there is a patent SVG-RCA graft noted. Coronary Arteries: Three vessel coronary artery calcifications. SVG to RCA graft seen arising from mid ascending aorta. For valve in valve TAVR, this valve would be suited for a 23 mm Evolut Core Valve. OTHER: Severe mitral annular calcifications. No left atrial appendage thrombus. Normal pulmonary vein anatomy. IMPRESSION: 1.  21 mm Magna Ease bioprosthetic aortic valve with severe stenosis. Moderately reduced leaflet excursion with moderately calcified leaflets. 2.  Normal ascending aorta dimensions. 3. Adequate left coronary height and distance from prosthesis for placement of a  23 mm Evolut Core Valve in a valve in valve fashion. 4.  Severe mitral annular calcification. Electronically Signed   By: Cherlynn Kaiser   On: 02/10/2019 16:53   Result Date: 02/10/2019 EXAM: OVER-READ INTERPRETATION  CT CHEST The following report is an over-read performed by radiologist Dr. Salvatore Marvel of [facility], [place] on 02/10/2019. This over-read does not include interpretation of cardiac or coronary anatomy or pathology. The coronary CTA interpretation by the cardiologist is attached. COMPARISON:  07/08/2004 chest CT angiogram. FINDINGS: Please see the separate concurrent chest CT angiogram report for details. IMPRESSION: Please see the concurrent chest CT angiogram report for details. Electronically Signed: By: Ilona Sorrel M.D. On: 02/10/2019 15:39   CT ANGIO CHEST AORTA W/CM &/OR WO/CM  Result Date: 02/10/2019 CLINICAL DATA:  Inpatient. Aortic valve replacement and CABG 2006. Severe symptomatic aortic stenosis. Normal EF. Pre-TAVR evaluation. Remote history of left breast cancer. EXAM: CT ANGIOGRAPHY CHEST, ABDOMEN AND PELVIS TECHNIQUE: Multidetector CT imaging through the chest, abdomen and pelvis was performed using the standard protocol during bolus administration of intravenous contrast. Multiplanar reconstructed images and MIPs were obtained and reviewed to evaluate the vascular anatomy. CONTRAST:  172mL OMNIPAQUE IOHEXOL 350 MG/ML SOLN COMPARISON:  07/08/2004 chest and abdomen CT angiogram. FINDINGS: CTA CHEST FINDINGS Cardiovascular: Mild cardiomegaly. Right subclavian 2 lead pacemaker with lead tips in the right atrium and interventricular septum. Mild subcutaneous emphysema surrounding the pacemaker device in the ventral upper  right chest wall, compatible with recent device placement. No fluid collections. No significant pericardial effusion/thickening. Left main and 3 vessel coronary atherosclerosis status post CABG. Aortic valve prosthesis in place. Atherosclerotic nonaneurysmal thoracic aorta. Top-normal caliber main pulmonary artery (3.2 cm diameter). No central pulmonary emboli. Mediastinum/Nodes: No discrete thyroid nodules. Unremarkable esophagus. No pathologically enlarged axillary, mediastinal or hilar lymph nodes. Surgical clips noted in the left axilla. Lungs/Pleura: No pneumothorax. No pleural effusion. No acute consolidative airspace disease, lung masses or significant pulmonary nodules. Mosaic attenuation throughout both lungs. Musculoskeletal: No aggressive appearing focal osseous lesions. Intact sternotomy wires. Moderate thoracic spondylosis. Moderate T11 vertebral compression fracture, chronic and stable since 06/22/2017 thoracic spine radiographs. CTA ABDOMEN AND PELVIS FINDINGS Hepatobiliary: Normal liver with no liver mass. Cholecystectomy. Bile ducts are within normal post cholecystectomy limits with CBD diameter 7 mm. Pancreas: Normal, with no mass or duct dilation. Spleen: Normal size. No mass. Adrenals/Urinary Tract: Normal adrenals. Simple 4.8 cm lower right renal cyst. No additional contour deforming renal lesions. No hydronephrosis. Normal bladder. Stomach/Bowel: Small hiatal hernia. Otherwise normal stomach. Normal caliber small bowel with no small bowel wall thickening. Normal appendix. Mild sigmoid diverticulosis, with no large bowel wall thickening or significant pericolonic fat stranding. Vascular/Lymphatic: Atherosclerotic nonaneurysmal abdominal aorta. Patent splenic and renal veins. No pathologically enlarged lymph nodes in the abdomen or pelvis. Reproductive: Status post hysterectomy, with no abnormal findings at the vaginal cuff. No adnexal mass. Other: No pneumoperitoneum, ascites or focal fluid  collection. Musculoskeletal: No aggressive appearing focal osseous lesions. Moderate lumbar spondylosis. VASCULAR MEASUREMENTS PERTINENT TO TAVR: AORTA: Minimal Aortic Diameter-14.0 x 13.8 mm Severity of Aortic Calcification-moderate RIGHT PELVIS: Right Common Iliac Artery - Minimal Diameter-8.8 x 8.6 mm Tortuosity-moderate Calcification-moderate Right External Iliac Artery - Minimal Diameter-7.7 x 7.1 mm Tortuosity-mild Calcification-mild Right Common Femoral Artery - Minimal Diameter-7.7 x 7.3 mm Tortuosity-mild Calcification-mild LEFT PELVIS: Left Common Iliac Artery - Minimal Diameter-9.6 x 9.2 mm Tortuosity-moderate Calcification-moderate Left External Iliac Artery - Minimal Diameter-6.9 x 6.8 mm Tortuosity-mild Calcification-mild Left Common Femoral Artery - Minimal  Diameter-7.4 x 6.7 mm Tortuosity-mild Calcification-mild Review of the MIP images confirms the above findings. IMPRESSION: 1. Vascular findings and measurements pertinent to potential TAVR procedure, as detailed. 2. Mild cardiomegaly. Left main and 3 vessel coronary atherosclerosis status post CABG. 3. Mosaic attenuation throughout both lungs, nonspecific, which could be due to mosaic perfusion from pulmonary vascular disease versus air trapping from small airways disease. 4. Small hiatal hernia. 5. Mild sigmoid diverticulosis. 6.  Aortic Atherosclerosis (ICD10-I70.0). Electronically Signed   By: Ilona Sorrel M.D.   On: 02/10/2019 16:51   CT Angio Abd/Pel w/ and/or w/o  Result Date: 02/10/2019 CLINICAL DATA:  Inpatient. Aortic valve replacement and CABG 2006. Severe symptomatic aortic stenosis. Normal EF. Pre-TAVR evaluation. Remote history of left breast cancer. EXAM: CT ANGIOGRAPHY CHEST, ABDOMEN AND PELVIS TECHNIQUE: Multidetector CT imaging through the chest, abdomen and pelvis was performed using the standard protocol during bolus administration of intravenous contrast. Multiplanar reconstructed images and MIPs were obtained and reviewed  to evaluate the vascular anatomy. CONTRAST:  182mL OMNIPAQUE IOHEXOL 350 MG/ML SOLN COMPARISON:  07/08/2004 chest and abdomen CT angiogram. FINDINGS: CTA CHEST FINDINGS Cardiovascular: Mild cardiomegaly. Right subclavian 2 lead pacemaker with lead tips in the right atrium and interventricular septum. Mild subcutaneous emphysema surrounding the pacemaker device in the ventral upper right chest wall, compatible with recent device placement. No fluid collections. No significant pericardial effusion/thickening. Left main and 3 vessel coronary atherosclerosis status post CABG. Aortic valve prosthesis in place. Atherosclerotic nonaneurysmal thoracic aorta. Top-normal caliber main pulmonary artery (3.2 cm diameter). No central pulmonary emboli. Mediastinum/Nodes: No discrete thyroid nodules. Unremarkable esophagus. No pathologically enlarged axillary, mediastinal or hilar lymph nodes. Surgical clips noted in the left axilla. Lungs/Pleura: No pneumothorax. No pleural effusion. No acute consolidative airspace disease, lung masses or significant pulmonary nodules. Mosaic attenuation throughout both lungs. Musculoskeletal: No aggressive appearing focal osseous lesions. Intact sternotomy wires. Moderate thoracic spondylosis. Moderate T11 vertebral compression fracture, chronic and stable since 06/22/2017 thoracic spine radiographs. CTA ABDOMEN AND PELVIS FINDINGS Hepatobiliary: Normal liver with no liver mass. Cholecystectomy. Bile ducts are within normal post cholecystectomy limits with CBD diameter 7 mm. Pancreas: Normal, with no mass or duct dilation. Spleen: Normal size. No mass. Adrenals/Urinary Tract: Normal adrenals. Simple 4.8 cm lower right renal cyst. No additional contour deforming renal lesions. No hydronephrosis. Normal bladder. Stomach/Bowel: Small hiatal hernia. Otherwise normal stomach. Normal caliber small bowel with no small bowel wall thickening. Normal appendix. Mild sigmoid diverticulosis, with no large  bowel wall thickening or significant pericolonic fat stranding. Vascular/Lymphatic: Atherosclerotic nonaneurysmal abdominal aorta. Patent splenic and renal veins. No pathologically enlarged lymph nodes in the abdomen or pelvis. Reproductive: Status post hysterectomy, with no abnormal findings at the vaginal cuff. No adnexal mass. Other: No pneumoperitoneum, ascites or focal fluid collection. Musculoskeletal: No aggressive appearing focal osseous lesions. Moderate lumbar spondylosis. VASCULAR MEASUREMENTS PERTINENT TO TAVR: AORTA: Minimal Aortic Diameter-14.0 x 13.8 mm Severity of Aortic Calcification-moderate RIGHT PELVIS: Right Common Iliac Artery - Minimal Diameter-8.8 x 8.6 mm Tortuosity-moderate Calcification-moderate Right External Iliac Artery - Minimal Diameter-7.7 x 7.1 mm Tortuosity-mild Calcification-mild Right Common Femoral Artery - Minimal Diameter-7.7 x 7.3 mm Tortuosity-mild Calcification-mild LEFT PELVIS: Left Common Iliac Artery - Minimal Diameter-9.6 x 9.2 mm Tortuosity-moderate Calcification-moderate Left External Iliac Artery - Minimal Diameter-6.9 x 6.8 mm Tortuosity-mild Calcification-mild Left Common Femoral Artery - Minimal Diameter-7.4 x 6.7 mm Tortuosity-mild Calcification-mild Review of the MIP images confirms the above findings. IMPRESSION: 1. Vascular findings and measurements pertinent to potential TAVR procedure, as detailed.  2. Mild cardiomegaly. Left main and 3 vessel coronary atherosclerosis status post CABG. 3. Mosaic attenuation throughout both lungs, nonspecific, which could be due to mosaic perfusion from pulmonary vascular disease versus air trapping from small airways disease. 4. Small hiatal hernia. 5. Mild sigmoid diverticulosis. 6.  Aortic Atherosclerosis (ICD10-I70.0). Electronically Signed   By: Ilona Sorrel M.D.   On: 02/10/2019 16:51    Cardiac Studies  Echo 02/08/19  1. Left ventricular ejection fraction, by visual estimation, is 70 to 75%. The left ventricle has  hyperdynamic function. There is moderately increased left ventricular hypertrophy.  2. Abnormal septal motion consistent with post-operative status.  3. Elevated left atrial pressure.  4. The left ventricle has no regional wall motion abnormalities.  5. Global right ventricle has normal systolic function.The right ventricular size is normal. No increase in right ventricular wall thickness.  6. Left atrial size was severely dilated.  7. Right atrial size was mildly dilated.  8. Severe mitral annular calcification.  9. The mitral valve is degenerative. Mild mitral valve regurgitation. Moderate-severe mitral stenosis. 10. MV mean gradient, 10 mmHg at 45 bpm. Calculated area is 1.4 cm sq by PHT and 1.1 cm sq by continuity equation. 11. Severe aortic valve bioprosthesis stenosis. Mean gradient is 55 mm Hg, dimensionless obstructive index 0.24. 12. Aortic valve regurgitation is mild. 13. The tricuspid valve is normal in structure. Tricuspid valve regurgitation is mild. 14. Pulmonic regurgitation is mild. 15. The pulmonic valve was grossly normal. Pulmonic valve regurgitation is mild. 16. Severely elevated pulmonary artery systolic pressure. 17. The tricuspid regurgitant velocity is 3.97 m/s, and with an assumed right atrial pressure of 15 mmHg, the estimated right ventricular systolic pressure is severely elevated at 78.0 mmHg. 18. The inferior vena cava is dilated in size with <50% respiratory variability, suggesting right atrial pressure of 15 mmHg.  R/LHC 12/16  1.  Severe single-vessel coronary artery disease with total occlusion of the mid RCA and patency of the saphenous vein graft PDA 2.  Patent left main, LAD, and left circumflex with mild diffuse irregularity but no significant stenoses 3.  Known critical bioprosthetic aortic stenosis, valve not crossed during this procedure 4.  Mild pulmonary hypertension with mean PA pressure 26 mmHg, secondary to left heart disease (pulmonary capillary  wedge pressure mean 25 mmHg)  Plan: Multidisciplinary heart valve team review, EP consultation for consideration of permanent pacemaker in this patient with complete heart block and left bundle branch block and pending TAVR valve in valve.  Patient Profile     [name] is a 73 y.o. female with a history of CAD s/p CABG (SVG-RCA) with AVR (6269) course complicated by CVA (presumed embolic), CKD stage 2, IDDM, HLD, HTN, chronic diastolic heart failure, and hx of breast cancer treated with tamoxifen and mastectomy (1996), no radiation.  Assessment & Plan    Principal Problem:   Acute on chronic diastolic CHF (congestive heart failure) (HCC) Active Problems:   DM (diabetes mellitus) ([place])   Hyperlipidemia   Aortic stenosis s/p Tissure AVR 2006   CAD (coronary artery disease)   Hypertension   Constipation   CKD (chronic kidney disease), stage III   Hypokalemia   Bradycardia   Acute on chronic diastolic (congestive) heart failure (HCC)   Complete heart block ([place])  1.  Very severe bioprosthetic aortic valve stenosis-  Creatinine is stable after TAVR CT. plan for outpatient TAVR evaluation.  I made appointment for the patient in my clinic to ensure she continues to  do well, and our structural heart team will coordinate the remainder of her appointments pre-TAVR.  2.  Complete heart block status post permanent pacemaker implantation.  Chest x-ray reviewed.  She feels much better today after pacing.  Her rate is approximately 80 and device appears to be functioning appropriately.  3.  Acute on chronic diastolic heart failure- her volume status has improved and she appears much better after diuresis and pacing. -She appears much better from a volume standpoint.  Can continue Lasix 40 mg daily p.o., with close reevaluation at follow-up with labs and renal function as well as an assessment of volume status.  4.  Moderate to severe mitral stenosis with severe mitral annular  calcification-she is minimally ambulatory at baseline but does walk with a walker to the bathroom at home. A lower HR will likely be best to address her mitral stenosis. This can be adjusted as symptoms are assessed with our colleagues in [place] as needed.  5.  Hypertension-Home medications include amlodipine 10 mg daily hydralazine 50 mg 3 times daily metoprolol succinate 25 mg daily.  We will obtain better control of her blood pressure, however being very cautious to avoid hypotension in the setting of the very severely stenotic aortic valve.  - will metoprolol succ 25 mg daily  - will add amlodipine 5 mg daily - hold hydralazine in setting of severe AS.   6.  CAD status post one-vessel CABG at the time of AVR 2006-she is on aspirin, statin, home beta-blocker.  Disease has not significantly changed on cath.  7.  Mild pulmonary hypertension secondary to left heart disease-likely needs gentle diuresis with an eye towards her renal function, and management of her valve disease. Lasix as above.      For questions or updates, please contact [place] Please consult [email or web address] for contact info under        Signed, Elouise Munroe, MD  02/12/2019, 11:00 AM

## 2019-02-12 NOTE — Progress Notes (Signed)
Subjective: HD#5 Overnight, no acute events reported. This morning, [name] was evaluated at bedside.She states that she had an uneventful night, but was wondering about her eventual placement. We discussed that no beds are available for inpatient rehab at this time and that upon discussion with her daughter, [name], yesterday, plan is for discharge to SNF. Patient is agreeable for this. All questions and concerns addressed.   Consults: Cardiology, EP, CTS  Objective:  Vital signs in last 24 hours: Vitals:   02/11/19 1410 02/11/19 1614 02/11/19 1948 02/12/19 0154  BP: (!) 158/96 (!) 161/101 126/84   Pulse: 85 78 73   Resp: 20  20   Temp: 98.3 F (36.8 C)  98.5 F (36.9 C)   TempSrc: Oral  Oral   SpO2: 94%  97%   Weight:    75.2 kg  Height:       CBC Latest Ref Rng & Units 02/11/2019 02/10/2019 02/09/2019  WBC 4.0 - 10.5 K/uL 6.7 6.6 -  Hemoglobin 12.0 - 15.0 g/dL 9.5(L) 9.5(L) 9.2(L)  Hematocrit 36.0 - 46.0 % 30.2(L) 29.1(L) 27.0(L)  Platelets 150 - 400 K/uL 216 187 -   BMP Latest Ref Rng & Units 02/12/2019 02/11/2019 02/10/2019  Glucose 70 - 99 mg/dL 169(H) 139(H) 156(H)  BUN 8 - 23 mg/dL $Remove'22 19 21  'IMPiUIV$ Creatinine 0.44 - 1.00 mg/dL 1.38(H) 1.35(H) 1.35(H)  BUN/Creat Ratio 12 - 28 - - -  Sodium 135 - 145 mmol/L 138 140 143  Potassium 3.5 - 5.1 mmol/L 4.4 3.9 3.8  Chloride 98 - 111 mmol/L 101 104 104  CO2 22 - 32 mmol/L $RemoveB'28 29 28  'ttXnwgfI$ Calcium 8.9 - 10.3 mg/dL 8.6(L) 8.4(L) 8.4(L)   CBG (last 3)  Recent Labs    02/11/19 1134 02/11/19 1636 02/11/19 2113  GLUCAP 198* 184* 239*   Physical Exam Constitutional:      General: She is not in acute distress.    Appearance: She is not diaphoretic.  Cardiovascular:     Rate and Rhythm: Normal rate and regular rhythm.     Pulses: Normal pulses.     Heart sounds: Murmur present.  Pulmonary:     Effort: Pulmonary effort is normal.     Breath sounds: Normal breath sounds.  Abdominal:     General: Bowel sounds are normal.       Palpations: Abdomen is soft.     Tenderness: There is no abdominal tenderness.  Skin:    General: Skin is warm and dry.     Capillary Refill: Capillary refill takes less than 2 seconds.  Neurological:     Mental Status: She is alert and oriented to person, place, and time.    Assessment/Plan: [name] is a 73yo female with PMHx of HFpEF s/p bioprosthetic AVT (2006), CAD s/p CABG, and CKDIII admiitted for acute on chronic HFpEF 2/2 severe aortic valve stenosis.   Acute on chronic HFpEF: Severe bioprosthetic aortic valve stenosis:  Patient had TAVR CT 2 days ago that showed adequate coronary height and distance on left for valve in valve TAVR which is planned for 03/01/2019. She is euvolemic on examination after diuresis and pacing. Creatinine is stable this morning.  - Patient stable for discharge to SNF for physical therapy prior to TAVR surgery - Appreciate cardiology, EP and CTS recommendations and assistance   Complete heart block:  Patient is s/p pacemaker placement. HR 75-80 and patient reports feeling good this morning.   CKD Stage III: sCr stable ~1.3-1.4. Patient  produced ~1.2L over past 24 hours.  - Continue to monitor  Hypertension: Patient's home medications include amlodipine 10mg  qd, hydralazine 50mg  tid and metoprolol succinate 25mg  qd. Patient was restarted on her metoprolol yesterday. BP this AM 145/72.  - Continue metoprolol succinate 25mg  qd  - Add amlodipine 5mg  qd   FEN: Heart healthy Code: FULL VTE Prophylaxis: Lovenox  Dispo: Anticipated discharge pending SNF placement.   Harvie Heck, MD  Internal Medicine, PGY-1 02/12/2019, 6:00 AM Pager: [phone]

## 2019-02-13 DIAGNOSIS — N183 Chronic kidney disease, stage 3 unspecified: Secondary | ICD-10-CM

## 2019-02-13 DIAGNOSIS — Z951 Presence of aortocoronary bypass graft: Secondary | ICD-10-CM

## 2019-02-13 LAB — GLUCOSE, CAPILLARY
Glucose-Capillary: 157 mg/dL — ABNORMAL HIGH (ref 70–99)
Glucose-Capillary: 238 mg/dL — ABNORMAL HIGH (ref 70–99)
Glucose-Capillary: 193 mg/dL — ABNORMAL HIGH (ref 70–99)
Glucose-Capillary: 164 mg/dL — ABNORMAL HIGH (ref 70–99)
Glucose-Capillary: 301 mg/dL — ABNORMAL HIGH (ref 70–99)

## 2019-02-13 MED ORDER — AMLODIPINE BESYLATE 5 MG PO TABS: 5.0000 mg | ORAL_TABLET | Freq: Every day | ORAL | 0 refills | Status: DC

## 2019-02-13 MED ORDER — FUROSEMIDE 40 MG PO TABS: 40.0000 mg | ORAL_TABLET | Freq: Every day | ORAL | 0 refills | Status: DC

## 2019-02-13 NOTE — Progress Notes (Signed)
Progress Note  Patient Name: [name] Date of Encounter: 02/13/2019  Primary Cardiologist: [name], MD   Subjective   Feels well no concerns  Inpatient Medications    Scheduled Meds: . amLODipine  5 mg Oral Daily  . aspirin EC  81 mg Oral Daily  . atorvastatin  80 mg Oral q1800  . enoxaparin (LOVENOX) injection  30 mg Subcutaneous Q24H  . furosemide  40 mg Oral Daily  . insulin aspart  0-15 Units Subcutaneous TID WC  . insulin aspart  0-5 Units Subcutaneous QHS  . metoprolol succinate  25 mg Oral Daily  . PARoxetine  30 mg Oral Daily  . polyethylene glycol  17 g Oral BID  . senna  2 tablet Oral Daily  . sodium chloride flush  3 mL Intravenous Q12H  . sodium chloride flush  3 mL Intravenous Q12H   Continuous Infusions: . sodium chloride 250 mL (02/09/19 2005)   PRN Meds: sodium chloride, acetaminophen, hydrALAZINE, ondansetron (ZOFRAN) IV, sodium chloride flush   Vital Signs    Vitals:   02/12/19 2002 02/13/19 0446 02/13/19 0939 02/13/19 1212  BP: 136/62 (!) 128/56 (!) 146/58 (!) 142/55  Pulse: 76 (!) 59 67 68  Resp: $Remo'18 18  20  'LDalr$ Temp: 98.2 F (36.8 C) 97.9 F (36.6 C)    TempSrc: Oral     SpO2: 97% 97%  96%  Weight:  75.4 kg    Height:        Intake/Output Summary (Last 24 hours) at 02/13/2019 1337 Last data filed at 02/13/2019 1251 Gross per 24 hour  Intake 246 ml  Output 2250 ml  Net -2004 ml   Last 3 Weights 02/13/2019 02/12/2019 02/11/2019  Weight (lbs) 166 lb 3.6 oz 165 lb 11.2 oz 169 lb 12.1 oz  Weight (kg) 75.4 kg 75.161 kg 77 kg      Telemetry    V paced rhythm - Personally Reviewed  ECG    A sense V paced- Personally Reviewed  Physical Exam  Constitutional: No acute distress Eyes: sclera non-icteric, normal conjunctiva and lids Cardiovascular: regular rhythm, normal rate, 3/6 systolic ejection murmur obscures S2. Radial pulses normal bilaterally. No jugular venous distention.  Site of pacemaker generator has mild  edema, no erythema, dressings are clean dry and intact. Respiratory: clear to auscultation bilaterally GI : normal bowel sounds, soft and nontender. No distention.   MSK: extremities warm, well perfused. No edema.  NEURO: grossly nonfocal exam, moves all extremities. PSYCH: alert and oriented x 3, normal mood and affect.   Labs    High Sensitivity Troponin:   Recent Labs  Lab 02/07/19 1323 02/07/19 1626  TROPONINIHS 24* 26*      Chemistry Recent Labs  Lab 02/07/19 1323 02/07/19 1527 02/10/19 0431 02/11/19 0419 02/12/19 0339  NA 143  --  143 140 138  K 3.3*  --  3.8 3.9 4.4  CL 110  --  104 104 101  CO2 23   < > $R'28 29 28  'wn$ GLUCOSE 203*  --  156* 139* 169*  BUN 25*  --  $R'21 19 22  'Fq$ CREATININE 1.48*  --  1.35* 1.35* 1.38*  CALCIUM 8.2*   < > 8.4* 8.4* 8.6*  PROT 6.5  --   --   --   --   ALBUMIN 3.1*  --   --   --   --   AST 13*  --   --   --   --  ALT 12  --   --   --   --   ALKPHOS 96  --   --   --   --   BILITOT 1.0  --   --   --   --   GFRNONAA 34*   < > 38* 38* 37*  GFRAA 40*   < > 44* 44* 43*  ANIONGAP 10   < > $R'11 7 9   'zp$ < > = values in this interval not displayed.     Hematology Recent Labs  Lab 02/07/19 1323 02/09/19 1104 02/10/19 0431 02/11/19 0419  WBC 11.7*  --  6.6 6.7  RBC 3.60*  --  3.07* 3.16*  HGB 11.0* 9.2* 9.5* 9.5*  HCT 34.4* 27.0* 29.1* 30.2*  MCV 95.6  --  94.8 95.6  MCH 30.6  --  30.9 30.1  MCHC 32.0  --  32.6 31.5  RDW 14.0  --  13.6 13.5  PLT 207  --  187 216    BNP Recent Labs  Lab 02/07/19 1323  BNP 2,131.7*     DDimer No results for input(s): DDIMER in the last 168 hours.   Radiology    No results found.  Cardiac Studies  Echo 02/08/19  1. Left ventricular ejection fraction, by visual estimation, is 70 to 75%. The left ventricle has hyperdynamic function. There is moderately increased left ventricular hypertrophy.  2. Abnormal septal motion consistent with post-operative status.  3. Elevated left atrial pressure.  4.  The left ventricle has no regional wall motion abnormalities.  5. Global right ventricle has normal systolic function.The right ventricular size is normal. No increase in right ventricular wall thickness.  6. Left atrial size was severely dilated.  7. Right atrial size was mildly dilated.  8. Severe mitral annular calcification.  9. The mitral valve is degenerative. Mild mitral valve regurgitation. Moderate-severe mitral stenosis. 10. MV mean gradient, 10 mmHg at 45 bpm. Calculated area is 1.4 cm sq by PHT and 1.1 cm sq by continuity equation. 11. Severe aortic valve bioprosthesis stenosis. Mean gradient is 55 mm Hg, dimensionless obstructive index 0.24. 12. Aortic valve regurgitation is mild. 13. The tricuspid valve is normal in structure. Tricuspid valve regurgitation is mild. 14. Pulmonic regurgitation is mild. 15. The pulmonic valve was grossly normal. Pulmonic valve regurgitation is mild. 16. Severely elevated pulmonary artery systolic pressure. 17. The tricuspid regurgitant velocity is 3.97 m/s, and with an assumed right atrial pressure of 15 mmHg, the estimated right ventricular systolic pressure is severely elevated at 78.0 mmHg. 18. The inferior vena cava is dilated in size with <50% respiratory variability, suggesting right atrial pressure of 15 mmHg.  R/LHC 12/16  1.  Severe single-vessel coronary artery disease with total occlusion of the mid RCA and patency of the saphenous vein graft PDA 2.  Patent left main, LAD, and left circumflex with mild diffuse irregularity but no significant stenoses 3.  Known critical bioprosthetic aortic stenosis, valve not crossed during this procedure 4.  Mild pulmonary hypertension with mean PA pressure 26 mmHg, secondary to left heart disease (pulmonary capillary wedge pressure mean 25 mmHg)  Plan: Multidisciplinary heart valve team review, EP consultation for consideration of permanent pacemaker in this patient with complete heart block and left  bundle branch block and pending TAVR valve in valve.  Patient Profile     [name] is a 73 y.o. female with a history of CAD s/p CABG (SVG-RCA) with AVR (2376) course complicated by CVA (presumed embolic),  CKD stage 2, IDDM, HLD, HTN, chronic diastolic heart failure, and hx of breast cancer treated with tamoxifen and mastectomy (1996), no radiation.  Assessment & Plan    Principal Problem:   Acute on chronic diastolic CHF (congestive heart failure) (HCC) Active Problems:   DM (diabetes mellitus) ([place])   Hyperlipidemia   Aortic stenosis s/p Tissure AVR 2006   CAD (coronary artery disease)   Hypertension   Constipation   CKD (chronic kidney disease), stage III   Hypokalemia   Bradycardia   Acute on chronic diastolic (congestive) heart failure (HCC)   Complete heart block ([place])  1.  Very severe bioprosthetic aortic valve stenosis-  Creatinine is stable after TAVR CT. plan for outpatient TAVR evaluation.  I made appointment for the patient in my clinic to ensure she continues to do well, and our structural heart team will coordinate the remainder of her appointments pre-TAVR.  2.  Complete heart block status post permanent pacemaker implantation.  Chest x-ray reviewed.  She feels much better today after pacing.  Her rate is approximately 80 and device appears to be functioning appropriately.  3.  Acute on chronic diastolic heart failure- her volume status has improved and she appears much better after diuresis and pacing. -She appears much better from a volume standpoint.  Can continue Lasix 40 mg daily p.o., with close reevaluation at follow-up with labs and renal function as well as an assessment of volume status.  4.  Moderate to severe mitral stenosis with severe mitral annular calcification-she is minimally ambulatory at baseline but does walk with a walker to the bathroom at home. A lower HR will likely be best to address her mitral stenosis. This can be adjusted as symptoms  are assessed with our colleagues in [place] as needed.  5.  Hypertension-Home medications include amlodipine 10 mg daily hydralazine 50 mg 3 times daily metoprolol succinate 25 mg daily.  We will obtain better control of her blood pressure, however being very cautious to avoid hypotension in the setting of the very severely stenotic aortic valve.  - will metoprolol succ 25 mg daily  - will add amlodipine 5 mg daily - hold hydralazine in setting of severe AS.   6.  CAD status post one-vessel CABG at the time of AVR 2006-she is on aspirin, statin, home beta-blocker.  Disease has not significantly changed on cath.  7.  Mild pulmonary hypertension secondary to left heart disease-likely needs gentle diuresis with an eye towards her renal function, and management of her valve disease. Lasix as above.      For questions or updates, please contact [place] Please consult [email or web address] for contact info under        Signed, Elouise Munroe, MD  02/13/2019, 1:37 PM

## 2019-02-13 NOTE — Progress Notes (Signed)
  Date: 02/13/2019  Patient name: [name]  Medical record number: [id number]  Date of birth: 1945/10/29   I have seen and evaluated this patient and I have discussed the plan of care with the house staff. Please see their note for complete details.  Lenice Pressman, M.D., Ph.D. 02/13/2019, 4:02 PM

## 2019-02-13 NOTE — TOC Progression Note (Addendum)
Transition of Care [facility]) - Progression Note    Patient Details  Name: [name] MRN: [id number] Date of Birth: Jul 24, 1945  Transition of Care [facility]) CM/SW [place], [place] Phone Number: [phone] 02/13/2019, 9:54 AM  Clinical Narrative:     CSW confirmed facility choice with daughter [name] for [place]. CSW spoke with Celina from [facility] and she confirmed that they have a bed for her. CSW followed up with MD for discharge summary. Patient will be discharged to [facility] today.  10:15am- CSW received a call from [place] from [facility] to inform CSW that facility would be unable to accept patient today due COVID. CSW alerted MD and attempted to reach daughter to receive her response to the new information and had to leave message.  12:30pm- Spoke with daughter and she wanted CSW to reach out to [facility]. CSW followed up with Juliann Pulse and she stated that she may have a bed available tomorrow.  CSW will continue to follow for discharge planning needs.  Expected Discharge Plan: Skilled Nursing Facility Barriers to Discharge: SNF Pending bed offer  Expected Discharge Plan and Services Expected Discharge Plan: [place] Choice: [place] arrangements for the past 2 months: Single Family Home                                       Social Determinants of Health (SDOH) Interventions    Readmission Risk Interventions No flowsheet data found.

## 2019-02-13 NOTE — Progress Notes (Signed)
   Subjective:  [name] was seen and evaluated at bedside. She states that she has been doing well, and has no overnight complaints. She denies chest pain and headaches. We discussed waiting for her placement, and she was agreeable to the plan. All questions and concerns were addressed.   Consults: cardiology  Objective:  Vital signs in last 24 hours: Vitals:   02/12/19 1117 02/12/19 2002 02/13/19 0446 02/13/19 0939  BP: (!) 154/90 136/62 (!) 128/56 (!) 146/58  Pulse: 74 76 (!) 59 67  Resp:  18 18   Temp:  98.2 F (36.8 C) 97.9 F (36.6 C)   TempSrc:  Oral    SpO2: 94% 97% 97%   Weight:   75.4 kg   Height:       Physical Exam  Constitutional: She is well-developed, well-nourished, and in no distress. No distress.  Cardiovascular: Normal rate and regular rhythm.  Murmur (blowing systolic murmur) heard. Pulmonary/Chest: Effort normal. No respiratory distress.  Abdominal: Soft. Bowel sounds are normal. She exhibits no distension.  Neurological: She is alert.  Skin: Skin is warm and dry. She is not diaphoretic.  Psychiatric: Affect normal.   I/Os:  Intake/Output Summary (Last 24 hours) at 02/13/2019 1043 Last data filed at 02/13/2019 0451 Gross per 24 hour  Intake 604 ml  Output 1550 ml  Net -946 ml   Labs: BMP Latest Ref Rng & Units 02/12/2019 02/11/2019 02/10/2019  Glucose 70 - 99 mg/dL 169(H) 139(H) 156(H)  BUN 8 - 23 mg/dL 22 19 21   Creatinine 0.44 - 1.00 mg/dL 1.38(H) 1.35(H) 1.35(H)  BUN/Creat Ratio 12 - 28 - - -  Sodium 135 - 145 mmol/L 138 140 143  Potassium 3.5 - 5.1 mmol/L 4.4 3.9 3.8  Chloride 98 - 111 mmol/L 101 104 104  CO2 22 - 32 mmol/L 28 29 28   Calcium 8.9 - 10.3 mg/dL 8.6(L) 8.4(L) 8.4(L)   CBC Latest Ref Rng & Units 02/11/2019 02/10/2019 02/09/2019  WBC 4.0 - 10.5 K/uL 6.7 6.6 -  Hemoglobin 12.0 - 15.0 g/dL 9.5(L) 9.5(L) 9.2(L)  Hematocrit 36.0 - 46.0 % 30.2(L) 29.1(L) 27.0(L)  Platelets 150 - 400 K/uL 216 187 -   Assessment/Plan: Audrey.  Ursala Cressy is a 73yo female with PMHx of HFpEF s/p bioprosthetic AVT (2006), CAD s/p CABG, and CKDIII admiitted for acute on chronic HFpEF 2/2 severe aortic valve stenosis.   Acute on chronic HFpEF: Severe bioprosthetic aortic valve stenosis:  Patient had TAVR CT 2 days ago that showed adequate coronary height and distance on left for valve in valve TAVR which is planned for 03/01/2019. She is euvolemic on examination after diuresis and pacing. Creatinine is stable this morning.  - Patient stable for discharge to SNF for physical therapy prior to TAVR surgery  Complete heart block:  Patient is s/p pacemaker placement. HR 75-80 and patient reports feeling well this morning.   CKD Stage III: Yesterday's sCr stable ~1.3-1.4. Patient produced ~1.0L over past 24 hours.   Hypertension: Patient's home medications include amlodipine 10mg  qd, hydralazine 50mg  tid and metoprolol succinate 25mg  qd. Patient was restarted on her metoprolol and amlodipine 5 mg  - Continue metoprolol succinate 25mg  qd and amlodipine 5mg  qd  - Continue holding hydral  FEN: Heart healthy Code: FULL VTE Prophylaxis: Lovenox  Dispo: Anticipated discharge to SNF today.  Al Decant, MD 02/13/2019, 10:43 AM Pager: [phone]

## 2019-02-14 ENCOUNTER — Encounter: Payer: Self-pay | Admitting: Physician Assistant

## 2019-02-14 ENCOUNTER — Ambulatory Visit: Payer: Medicare Other

## 2019-02-14 ENCOUNTER — Other Ambulatory Visit: Payer: Self-pay | Admitting: Physician Assistant

## 2019-02-14 DIAGNOSIS — I10 Essential (primary) hypertension: Secondary | ICD-10-CM

## 2019-02-14 DIAGNOSIS — E7800 Pure hypercholesterolemia, unspecified: Secondary | ICD-10-CM

## 2019-02-14 LAB — GLUCOSE, CAPILLARY
Glucose-Capillary: 175 mg/dL — ABNORMAL HIGH (ref 70–99)
Glucose-Capillary: 238 mg/dL — ABNORMAL HIGH (ref 70–99)
Glucose-Capillary: 160 mg/dL — ABNORMAL HIGH (ref 70–99)

## 2019-02-14 LAB — CREATININE, SERUM
Creatinine, Ser: 1.57 mg/dL — ABNORMAL HIGH (ref 0.44–1.00)
GFR calc Af Amer: 37 mL/min — ABNORMAL LOW (ref >60)
GFR calc non Af Amer: 32 mL/min — ABNORMAL LOW (ref >60)

## 2019-02-14 NOTE — Progress Notes (Signed)
Internal Medicine Attending Note:  I have seen and evaluated this patient and I have discussed the plan of care with the house staff. Please see their note for complete details. I concur with their findings.   Velna Ochs, MD 02/14/2019, 11:24 AM

## 2019-02-14 NOTE — Progress Notes (Signed)
   Subjective:  Patient was seen this morning on rounds. Denies new complaints at this time. Denies pain or SOB.  Consults: cardiology  Objective:  Vital signs in last 24 hours: Vitals:   02/13/19 2258 02/14/19 0010 02/14/19 0526 02/14/19 0833  BP: 124/63 (!) 150/85 123/65 123/65  Pulse: 66 94 64 66  Resp: 18 18 11    Temp: 97.8 F (36.6 C) 98.3 F (36.8 C) 98 F (36.7 C)   TempSrc: Oral Oral Oral   SpO2:  99%    Weight:   75.4 kg   Height:       Physical Exam  Constitutional: She is well-developed, well-nourished, and in no distress. No distress.  Cardiovascular: Normal rate and regular rhythm.  Murmur (blowing systolic murmur) heard. Pulmonary/Chest: Effort normal. No respiratory distress.  Abdominal: Soft. Bowel sounds are normal. She exhibits no distension.  Neurological: She is alert.  Skin: Skin is warm and dry. She is not diaphoretic.  Psychiatric: Affect normal.   I/Os:  Intake/Output Summary (Last 24 hours) at 02/14/2019 0923 Last data filed at 02/14/2019 0527 Gross per 24 hour  Intake 720 ml  Output 2200 ml  Net -1480 ml   Labs: BMP Latest Ref Rng & Units 02/14/2019 02/12/2019 02/11/2019  Glucose 70 - 99 mg/dL - 169(H) 139(H)  BUN 8 - 23 mg/dL - 22 19  Creatinine 0.44 - 1.00 mg/dL 1.57(H) 1.38(H) 1.35(H)  BUN/Creat Ratio 12 - 28 - - -  Sodium 135 - 145 mmol/L - 138 140  Potassium 3.5 - 5.1 mmol/L - 4.4 3.9  Chloride 98 - 111 mmol/L - 101 104  CO2 22 - 32 mmol/L - 28 29  Calcium 8.9 - 10.3 mg/dL - 8.6(L) 8.4(L)   CBC Latest Ref Rng & Units 02/11/2019 02/10/2019 02/09/2019  WBC 4.0 - 10.5 K/uL 6.7 6.6 -  Hemoglobin 12.0 - 15.0 g/dL 9.5(L) 9.5(L) 9.2(L)  Hematocrit 36.0 - 46.0 % 30.2(L) 29.1(L) 27.0(L)  Platelets 150 - 400 K/uL 216 187 -   Assessment/Plan: [name] is a 73yo female with PMHx of HFpEF s/p bioprosthetic AVT (2006), CAD s/p CABG, and CKDIII admiitted for acute on chronic HFpEF 2/2 severe aortic valve stenosis.   Acute on  chronic HFpEF: Severe bioprosthetic aortic valve stenosis:  Patient had TAVR CT 2 days ago that showed adequate coronary height and distance on left for valve in valve TAVR which is planned for 03/01/2019. She is euvolemic on examination after diuresis and pacing.  - Patient stable for discharge to SNF for physical therapy prior to TAVR surgery  Complete heart block:  Patient is s/p pacemaker placement. HR stable. Patient reports feeling well this morning.   Hypertension: Patient's home medications include amlodipine 10mg  qd, hydralazine 50mg  tid and metoprolol succinate 25mg  qd. Patient was restarted on her metoprolol 25 mg and amlodipine at decreased dose of 5 mg; BP stable, 123/65 most recently  - Continue metoprolol succinate 25mg  qd and amlodipine 5mg  qd  - Continue holding hydral  FEN: Heart healthy Code: FULL VTE Prophylaxis: Lovenox  Dispo: Anticipated discharge to SNF today.  Al Decant, MD 02/14/2019, 9:23 AM Pager: [phone]

## 2019-02-14 NOTE — Telephone Encounter (Signed)
Still admitted 02/07/2019 - present (7 days)  [place]

## 2019-02-14 NOTE — Discharge Instructions (Signed)
After Your Pacemaker  . You have a Medtronic Pacemaker  . Do not lift your arm above shoulder height for 1 week after your procedure. After 7 days, you may progress as below.     Wednesday February 16, 2019  Thursday February 17, 2019 Friday February 18, 2019 Saturday February 19, 2019   . Do not lift, push, pull, or carry anything over 10 pounds with the affected arm until 6 weeks (Wednesday March 23, 2019) after your procedure.   . Do not drive until your wound check, or until instructed by your healthcare provider that you are safe to do so.   . Monitor your pacemaker site for redness, swelling, and drainage. Call the device clinic at [phone] if you experience these symptoms or fever/chills.  . If your incision is sealed with Steri-strips or staples, you may shower 7 days after your procedure. Do not remove the steri-strips or let the shower hit directly on your site. You may wash around your site with soap and water. If your incision is closed with Dermabond/Surgical glue. You may shower 1 day after your pacemaker implant and wash around the site with soap and water. Avoid lotions, ointments, or perfumes over your incision until it is well-healed.  . You may use a hot tub or a pool AFTER your wound check appointment if the incision is completely closed.  . Your Pacemaker may be MRI compatible. We will discuss this at your first follow up/wound check. .   . Remote monitoring is used to monitor your pacemaker from home. This monitoring is scheduled every 91 days by our office. It allows [place] to keep an eye on the functioning of your device to ensure it is working properly. You will routinely see your Electrophysiologist annually (more often if necessary).    Pacemaker Implantation, Care After This sheet gives you information about how to care for yourself after your procedure. Your health care provider may also give you more specific instructions. If you have problems or questions,  contact your health care provider. What can I expect after the procedure? After the procedure, it is common to have:  Mild pain.  Slight bruising.  Some swelling over the incision.  A slight bump over the skin where the device was placed. Sometimes, it is possible to feel the device under the skin. This is normal.  You should received your Pacemaker ID card within 4-8 weeks. Follow these instructions at home: Medicines  Take over-the-counter and prescription medicines only as told by your health care provider.  If you were prescribed an antibiotic medicine, take it as told by your health care provider. Do not stop taking the antibiotic even if you start to feel better. Wound care     Do not remove the bandage on your chest until directed to do so by your health care provider.  After your bandage is removed, you may see pieces of tape called skin adhesive strips over the area where the cut was made (incision site). Let them fall off on their own.  Check the incision site every day to make sure it is not infected, bleeding, or starting to pull apart.  Do not use lotions or ointments near the incision site unless directed to do so.  Keep the incision area clean and dry for 7 days after the procedure or as directed by your health care provider. It takes several weeks for the incision site to completely heal.  Do not take baths, swim, or use a  hot tub for 7-10 days or as otherwise directed by your health care provider. Activity  Do not drive or use heavy machinery while taking prescription pain medicine.  Do not drive for 24 hours if you were given a medicine to help you relax (sedative).  Check with your health care provider before you start to drive or play sports.  Avoid sudden jerking, pulling, or chopping movements that pull your upper arm far away from your body. Avoid these movements for at least 6 weeks or as long as told by your health care provider.  Do not lift your  upper arm above your shoulders for at least 6 weeks or as long as told by your health care provider. This means no tennis, golf, or swimming.  You may go back to work when your health care provider says it is okay. Pacemaker care  You may be shown how to transfer data from your pacemaker through the phone to your health care provider.  Always let all health care providers know about your pacemaker before you have any medical procedures or tests.  Wear a medical ID bracelet or necklace stating that you have a pacemaker. Carry a pacemaker ID card with you at all times.  Your pacemaker battery will last for 5-15 years. Routine checks by your health care provider will let the health care provider know when the battery is starting to run down. The pacemaker will need to be replaced when the battery starts to run down.  Do not use amateur Proofreader. Other electrical devices are safe to use, including power tools, lawn mowers, and speakers. If you are unsure of whether something is safe to use, ask your health care provider.  When using your cell phone, hold it to the ear opposite the pacemaker. Do not leave your cell phone in a pocket over the pacemaker.  Avoid places or objects that have a strong electric or magnetic field, including: ? Airport Actuary. When at the airport, let officials know that you have a pacemaker. ? Power plants. ? Large electrical generators. ? Radiofrequency transmission towers, such as cell phone and radio towers. General instructions  Weigh yourself every day. If you suddenly gain weight, fluid may be building up in your body.  Keep all follow-up visits as told by your health care provider. This is important. Contact a health care provider if:  You gain weight suddenly.  Your legs or feet swell.  It feels like your heart is fluttering or skipping beats (heart palpitations).  You have chills or a fever.  You have more  redness, swelling, or pain around your incisions.  You have more fluid or blood coming from your incisions.  Your incisions feel warm to the touch.  You have pus or a bad smell coming from your incisions. Get help right away if:  You have chest pain.  You have trouble breathing or are short of breath.  You become extremely tired.  You are light-headed or you faint. This information is not intended to replace advice given to you by your health care provider. Make sure you discuss any questions you have with your health care provider.       [facility] [address], [address] [place], [place]  [address] Phone:  [phone]   Fax:  [phone]  February 14, 2019  MRN: [id number]  RASHMI TALLENT [address] [place] [address]  Dear Ms. Shirlee Latch,  You are scheduled for drive thru Covid Swabbing (pre procedure lane)on Friday, 02/24/2019 at 8:30AMat the [facility] [address] After this appointment you will need to go home and quarantine until you return for surgery. You can attend medical appointments.   You are scheduled for Pre Admission Testing onFriday, 12/31/2020at 9:00AM. Please arrive in Admitting at [facility] (Main Entrance A, Valet Parking) at 8:45 AM for check-in. No restrictions for this appointment.No one is allowed in with the patient for this visit due to Covid 19 restrictions. A support person can be conferenced in over the phone.    Tuesday, January 5th, 2021 -TAVR:  Please arrive in Admitting at [facility](Main Entrance A) at7am for check-in.   Have nothing to eat or drink after midnight the night before surgery.  Continue taking all current medications without change through the day before surgerywith the following exception. 7 days prior to surgery STOP taking any Ibuprofen, all herbal medications and all vitamins. On the morning of surgery do  not take any medications.  If you have any questions or concerns, please do not hesitate to call.   Sincerely,   Angelena Form  [facility]

## 2019-02-14 NOTE — Progress Notes (Signed)
PT Cancellation Note  Patient Details Name: [name] MRN: [id number] DOB: January 02, 1946   Cancelled Treatment:    Reason Eval/Treat Not Completed: Other (comment) Spoke with nurse and pt is discharging to facility soon.   Transport has been called. Maggie Font, PT Acute Rehab Services Pager [phone] [facility] [phone] [facility] [place] 02/14/2019, 12:49 PM

## 2019-02-14 NOTE — TOC Transition Note (Signed)
Transition of Care [facility]) - CM/SW Discharge Note   Patient Details  Name: [name] MRN: [id number] Date of Birth: 31-Jul-1945  Transition of Care [facility]) CM/SW Contact:  Gelene Mink, [place] Phone Number: 02/14/2019, 11:39 AM   Clinical Narrative:     Patient will DC to: [facility] date: 02/14/2019 Family notified: Yes Transport by: Corey Harold   Per MD patient ready for DC to . RN, patient, patient's family, and facility notified of DC. Discharge Summary and FL2 sent to facility. RN to call report prior to discharge ([phone], Room 113P). DC packet on chart. Ambulance transport requested for patient.   CSW will sign off for now as social work intervention is no longer needed. Please consult [place] again if new needs arise.  Noni Stonesifer, LCSW-A [place]/Clinical Social Work Department Cell: [phone]    Final next level of care: [place] Barriers to Discharge: No Barriers Identified   Patient Goals and CMS Choice Patient states their goals for this hospitalization and ongoing recovery are:: Pt family is agreeable to SNF CMS Medicare.gov Compare Post Acute Care list provided to:: Patient Represenative (must comment) Choice offered to / list presented to : Adult Children  Discharge Placement   Existing PASRR number confirmed : 02/11/19          Patient [facility] bed at: [facility] Patient to be transferred to facility by: [place] Name of family member notified: [name], Daughter Patient and family notified of of transfer: 02/14/19  Discharge Plan and Services     Post Acute Care Choice: [place]          DME Arranged: N/A DME Agency: NA       HH Arranged: NA HH Agency: NA        Social Determinants of Health (SDOH) Interventions     Readmission Risk Interventions No flowsheet data found.

## 2019-02-14 NOTE — Progress Notes (Signed)
Progress Note  Patient Name: [name] Date of Encounter: 02/14/2019  Primary Cardiologist: [name], MD   Subjective  Denies any chest pain or SOB  Inpatient Medications    Scheduled Meds: . amLODipine  5 mg Oral Daily  . aspirin EC  81 mg Oral Daily  . atorvastatin  80 mg Oral q1800  . enoxaparin (LOVENOX) injection  30 mg Subcutaneous Q24H  . furosemide  40 mg Oral Daily  . insulin aspart  0-15 Units Subcutaneous TID WC  . insulin aspart  0-5 Units Subcutaneous QHS  . metoprolol succinate  25 mg Oral Daily  . PARoxetine  30 mg Oral Daily  . polyethylene glycol  17 g Oral BID  . senna  2 tablet Oral Daily  . sodium chloride flush  3 mL Intravenous Q12H  . sodium chloride flush  3 mL Intravenous Q12H   Continuous Infusions: . sodium chloride 250 mL (02/09/19 2005)   PRN Meds: sodium chloride, acetaminophen, hydrALAZINE, ondansetron (ZOFRAN) IV, sodium chloride flush   Vital Signs    Vitals:   02/13/19 1212 02/13/19 2258 02/14/19 0010 02/14/19 0526  BP: (!) 142/55 124/63 (!) 150/85 123/65  Pulse: 68 66 94 64  Resp: $Remo'20 18 18 11  'wCGJW$ Temp: 97.8 F (36.6 C) 97.8 F (36.6 C) 98.3 F (36.8 C) 98 F (36.7 C)  TempSrc: Oral Oral Oral Oral  SpO2: 96%  99%   Weight:    75.4 kg  Height:        Intake/Output Summary (Last 24 hours) at 02/14/2019 2956 Last data filed at 02/14/2019 2130 Gross per 24 hour  Intake 720 ml  Output 2200 ml  Net -1480 ml   Last 3 Weights 02/14/2019 02/13/2019 02/12/2019  Weight (lbs) 166 lb 3.6 oz 166 lb 3.6 oz 165 lb 11.2 oz  Weight (kg) 75.4 kg 75.4 kg 75.161 kg      Telemetry    V paced rhythm- Personally Reviewed  ECG    No new EKG to review- Personally Reviewed  Physical Exam  GEN: Well nourished, well developed in no acute distress HEENT: Normal NECK: No JVD; No carotid bruits LYMPHATICS: No lymphadenopathy CARDIAC:RRR, no murmurs, rubs, gallops RESPIRATORY:  Clear to auscultation without rales, wheezing  or rhonchi  ABDOMEN: Soft, non-tender, non-distended MUSCULOSKELETAL:  No edema; No deformity  SKIN: Warm and dry NEUROLOGIC:  Alert and oriented x 3 PSYCHIATRIC:  Normal affect    Labs    High Sensitivity Troponin:   Recent Labs  Lab 02/07/19 1323 02/07/19 1626  TROPONINIHS 24* 26*      Chemistry Recent Labs  Lab 02/07/19 1323 02/07/19 1527 02/10/19 0431 02/11/19 0419 02/12/19 0339 02/14/19 0540  NA 143  --  143 140 138  --   K 3.3*  --  3.8 3.9 4.4  --   CL 110  --  104 104 101  --   CO2 23   < > $R'28 29 28  'sz$ --   GLUCOSE 203*  --  156* 139* 169*  --   BUN 25*  --  $R'21 19 22  'jT$ --   CREATININE 1.48*  --  1.35* 1.35* 1.38* 1.57*  CALCIUM 8.2*   < > 8.4* 8.4* 8.6*  --   PROT 6.5  --   --   --   --   --   ALBUMIN 3.1*  --   --   --   --   --   AST 13*  --   --   --   --   --  ALT 12  --   --   --   --   --   ALKPHOS 96  --   --   --   --   --   BILITOT 1.0  --   --   --   --   --   GFRNONAA 34*   < > 38* 38* 37* 32*  GFRAA 40*   < > 44* 44* 43* 37*  ANIONGAP 10   < > $R'11 7 9  'ZD$ --    < > = values in this interval not displayed.     Hematology Recent Labs  Lab 02/07/19 1323 02/09/19 1104 02/10/19 0431 02/11/19 0419  WBC 11.7*  --  6.6 6.7  RBC 3.60*  --  3.07* 3.16*  HGB 11.0* 9.2* 9.5* 9.5*  HCT 34.4* 27.0* 29.1* 30.2*  MCV 95.6  --  94.8 95.6  MCH 30.6  --  30.9 30.1  MCHC 32.0  --  32.6 31.5  RDW 14.0  --  13.6 13.5  PLT 207  --  187 216    BNP Recent Labs  Lab 02/07/19 1323  BNP 2,131.7*     DDimer No results for input(s): DDIMER in the last 168 hours.   Radiology    No results found.  Cardiac Studies  Echo 02/08/19  1. Left ventricular ejection fraction, by visual estimation, is 70 to 75%. The left ventricle has hyperdynamic function. There is moderately increased left ventricular hypertrophy.  2. Abnormal septal motion consistent with post-operative status.  3. Elevated left atrial pressure.  4. The left ventricle has no regional wall  motion abnormalities.  5. Global right ventricle has normal systolic function.The right ventricular size is normal. No increase in right ventricular wall thickness.  6. Left atrial size was severely dilated.  7. Right atrial size was mildly dilated.  8. Severe mitral annular calcification.  9. The mitral valve is degenerative. Mild mitral valve regurgitation. Moderate-severe mitral stenosis. 10. MV mean gradient, 10 mmHg at 45 bpm. Calculated area is 1.4 cm sq by PHT and 1.1 cm sq by continuity equation. 11. Severe aortic valve bioprosthesis stenosis. Mean gradient is 55 mm Hg, dimensionless obstructive index 0.24. 12. Aortic valve regurgitation is mild. 13. The tricuspid valve is normal in structure. Tricuspid valve regurgitation is mild. 14. Pulmonic regurgitation is mild. 15. The pulmonic valve was grossly normal. Pulmonic valve regurgitation is mild. 16. Severely elevated pulmonary artery systolic pressure. 17. The tricuspid regurgitant velocity is 3.97 m/s, and with an assumed right atrial pressure of 15 mmHg, the estimated right ventricular systolic pressure is severely elevated at 78.0 mmHg. 18. The inferior vena cava is dilated in size with <50% respiratory variability, suggesting right atrial pressure of 15 mmHg.  R/LHC 12/16  1.  Severe single-vessel coronary artery disease with total occlusion of the mid RCA and patency of the saphenous vein graft PDA 2.  Patent left main, LAD, and left circumflex with mild diffuse irregularity but no significant stenoses 3.  Known critical bioprosthetic aortic stenosis, valve not crossed during this procedure 4.  Mild pulmonary hypertension with mean PA pressure 26 mmHg, secondary to left heart disease (pulmonary capillary wedge pressure mean 25 mmHg)  Plan: Multidisciplinary heart valve team review, EP consultation for consideration of permanent pacemaker in this patient with complete heart block and left bundle branch block and pending TAVR  valve in valve.  Patient Profile     [name] is a 73 y.o. 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  Assessment & Plan    Principal Problem:   Acute on chronic diastolic CHF (congestive heart failure) (HCC) Active Problems:   DM (diabetes mellitus) ([place])   Hyperlipidemia   Aortic stenosis s/p Tissure AVR 2006   CAD (coronary artery disease)   Hypertension   Constipation   CKD (chronic kidney disease), stage III   Hypokalemia   Bradycardia   Acute on chronic diastolic (congestive) heart failure (HCC)   Complete heart block ([place])  1.  Very severe bioprosthetic aortic valve stenosis-   -Creatinine is stable after TAVR CT.  -plan for outpatient TAVR evaluation.    2.  Complete heart block  -status post permanent pacemaker implantation.  -She feels much better  after pacing.   -Her rate is approximately 66 and device appears to be functioning appropriately.  3.  Acute on chronic diastolic heart failure-  -her volume status has improved and she appears much better after diuresis and pacing. -She appears much better from a volume standpoint and euvolemic -she put out 2.2L yesterday and is net neg 4.6L -weight is down 5lbs from admit -Continue Lasix 40 mg daily p.o. -creatinine stable at 1.38  4.  Moderate to severe mitral stenosis with severe mitral annular calcification -she is minimally ambulatory at baseline but does walk with a walker to the bathroom at home.  - A lower HR will likely be best to address her mitral stenosis.  -This can be adjusted as symptoms are assessed with our colleagues in [place] as needed. - continue Toprol XL 25mg  daily - HR in the 60's  5.  Hypertension -Home medications include amlodipine 10 mg daily hydralazine 50 mg 3 times daily  metoprolol succinate 25 mg daily.  We will obtain better control of her blood pressure, however being very cautious to avoid hypotension in the setting of the very severely stenotic aortic valve.  - BP controlled at 123/43mmHg - continue amlodipine 5mg  daily, Toprol XL 25mg  daily  6.  CAD  - status post one-vessel CABG at the time of AVR with SVG to [facility]  for occluded RCA in 2006 - continue aspirin, statin, home beta-blocker.   - Disease has not significantly changed on cath.  7.  Mild pulmonary hypertension  -secondary to left heart disease -continue Lasix 40mg  daily -creatinine stable at 1.38  8.  CKD stage 3a - Creatinine stable at 1.38 - baseline appears to be around 1.3-1.4     [facility] will sign off.   Medication Recommendations:  Amlodipine 5mg  daily, atorvastatin 80mg  daily, ASA 81mg  daily, Lasix 40mg  daily, Toprol XL 25mg  daily Other recommendations (labs, testing, etc):  none Follow up as an outpatient:  Dr. Margaretann Loveless on 02/28/2019 For questions or updates, please contact [facility] Please consult [email or web address] for contact info under        Signed, Fransico Him, MD  02/14/2019, 8:12 AM

## 2019-02-14 NOTE — Progress Notes (Signed)
Report given to [employer] in [employer].

## 2019-02-15 NOTE — Telephone Encounter (Signed)
Left message for pt to call.

## 2019-02-17 NOTE — Telephone Encounter (Signed)
Left message to call back  

## 2019-02-21 ENCOUNTER — Other Ambulatory Visit: Payer: Self-pay | Admitting: Internal Medicine

## 2019-02-22 ENCOUNTER — Telehealth: Payer: Self-pay

## 2019-02-22 NOTE — Telephone Encounter (Signed)
Pt wanted to know if she should cancel her wound check appointment because she having valve replacement surgery in January. I told her to keep the wound check appointment because the nurse has to make sure her wound is healing correctly. I told her that is when she will get her education about her monitor. That is when she will get any questions answered if she has any.

## 2019-02-23 ENCOUNTER — Other Ambulatory Visit: Payer: Self-pay | Admitting: Radiation Oncology

## 2019-02-23 ENCOUNTER — Encounter: Payer: Self-pay | Admitting: Physician Assistant

## 2019-02-23 ENCOUNTER — Encounter: Payer: Self-pay | Admitting: Student

## 2019-02-23 NOTE — Progress Notes (Signed)
[facility] DEVICE PROGRAMMING  Patient Information: Name:  [name]  DOB:  October 20, 1945  MRN:  [id number]   Planned Procedure: TAVR  Surgeon: Dr. Burt Knack  Date of Procedure: 02/28/2018  Cautery will be used.  Position during surgery: supine   Please send documentation back to:  [facility] (Fax # [phone])   Device Information:  Clinic EP Physician:  Allegra Lai, MD   Device Type:  Pacemaker Manufacturer and Phone #:  Medtronic: [phone] Pacemaker Dependent?:  Yes.  (by last check) Date of Last Device Check:  02/23/2019 (In person check for 02/24/2019) Normal Device Function?:  Yes.    Electrophysiologist's Recommendations:   Have magnet available.  Provide continuous ECG monitoring when magnet is used or reprogramming is to be performed.   Procedure will likely interfere with device function.  Device should be programmed:  Asynchronous pacing or per MD preference during procedure and returned to normal programming after procedure. Rep should be present for procedure.   Per Device Clinic Standing Cleta Alberts [place], [place]  11:25 AM 02/23/2019

## 2019-02-23 NOTE — Progress Notes (Signed)
Pt scheduled to early for covid testing. Pt resides in a nursing home and will need rapid testing on the day of surgery. Office aware.   Jacqlyn Larsen, RN

## 2019-02-23 NOTE — Progress Notes (Signed)
[facility] - [address], [place] - [address] [place] [address] Phone: [phone] Fax: [phone]  [facility] [address], [place] - [address] [place] [address] Phone: [phone] Fax: [phone]      Your procedure is scheduled on Tuesday 02/28/2018.  Report to [facility] Main Entrance "A" at 0700 A.M., and check in at the Admitting office.  Call this number if you have problems the morning of surgery:  [phone]  Call [phone] if you have any questions prior to your surgery date Monday-Friday 8am-4pm    Remember:  Do not eat or drink after midnight the night before your surgery   Continue all medications as directed by your surgeon's office.   The morning of surgery do not take any medications.    The Morning of Surgery  Do not wear jewelry, make-up or nail polish.  Do not wear lotions, powders, perfumes, or deodorant  Do not shave 48 hours prior to surgery.  Men may shave face and neck.  Do not bring valuables to the hospital.  [facility] is not responsible for any belongings or valuables.  If you are a smoker, DO NOT Smoke 24 hours prior to surgery  If you wear a CPAP at night please bring your mask, tubing, and machine the morning of surgery   Remember that you must have someone to transport you home after your surgery, and remain with you for 24 hours if you are discharged the same day.   Please bring cases for contacts, glasses, hearing aids, dentures or bridgework because it cannot be worn into surgery.    Leave your suitcase in the car.  After surgery it may be brought to your room.  For patients admitted to the hospital, discharge time will be determined by your treatment team.  Patients discharged the day of surgery will not be allowed to drive home.    Special instructions:   [place]- Preparing For Surgery  Before surgery, you can play an important role.  Because skin is not sterile, your skin needs to be as free of germs as possible. You can reduce the number of germs on your skin by washing with CHG (chlorahexidine gluconate) Soap before surgery.  CHG is an antiseptic cleaner which kills germs and bonds with the skin to continue killing germs even after washing.    Oral Hygiene is also important to reduce your risk of infection.  Remember - BRUSH YOUR TEETH THE MORNING OF SURGERY WITH YOUR REGULAR TOOTHPASTE  Please do not use if you have an allergy to CHG or antibacterial soaps. If your skin becomes reddened/irritated stop using the CHG.  Do not shave (including legs and underarms) for at least 48 hours prior to first CHG shower. It is OK to shave your face.  Please follow these instructions carefully.   1. Shower the NIGHT BEFORE SURGERY and the MORNING OF SURGERY with CHG Soap.   2. If you chose to wash your hair, wash your hair first as usual with your normal shampoo.  3. After you shampoo, rinse your hair and body thoroughly to remove the shampoo.  4. Use CHG as you would any other liquid soap. You can apply CHG directly to the skin and wash gently with a scrungie or a clean washcloth.   5. Apply the CHG Soap to your body ONLY FROM THE NECK DOWN.  Do not use on open wounds or open sores. Avoid contact with  your eyes, ears, mouth and genitals (private parts). Wash Face and genitals (private parts)  with your normal soap.   6. Wash thoroughly, paying special attention to the area where your surgery will be performed.  7. Thoroughly rinse your body with warm water from the neck down.  8. DO NOT shower/wash with your normal soap after using and rinsing off the CHG Soap.  9. Pat yourself dry with a CLEAN TOWEL.  10. Wear CLEAN PAJAMAS to bed the night before surgery, wear comfortable clothes the morning of surgery  11. Place CLEAN SHEETS on your bed the night of your first shower and DO NOT SLEEP WITH PETS.    Day of  Surgery:  Please shower the morning of surgery with the CHG soap Do not apply any deodorants/lotions. Please wear clean clothes to the hospital/surgery center.   Remember to brush your teeth WITH YOUR REGULAR TOOTHPASTE.   Please read over the following fact sheets that you were given.

## 2019-02-24 ENCOUNTER — Ambulatory Visit (INDEPENDENT_AMBULATORY_CARE_PROVIDER_SITE_OTHER): Payer: Medicare Other | Admitting: Student

## 2019-02-24 ENCOUNTER — Ambulatory Visit (HOSPITAL_COMMUNITY)
Admission: RE | Admit: 2019-02-24 | Discharge: 2019-02-24 | Disposition: A | Discharge status: Home / Self Care | Payer: No Typology Code available for payment source | Source: Ambulatory Visit | Attending: Physician Assistant | Admitting: Physician Assistant

## 2019-02-24 ENCOUNTER — Encounter (HOSPITAL_COMMUNITY): Payer: Self-pay

## 2019-02-24 ENCOUNTER — Other Ambulatory Visit: Payer: Self-pay

## 2019-02-24 ENCOUNTER — Encounter (HOSPITAL_COMMUNITY)
Admission: RE | Admit: 2019-02-24 | Discharge: 2019-02-24 | Disposition: A | Discharge status: Home / Self Care | Payer: No Typology Code available for payment source | Source: Ambulatory Visit | Attending: Cardiovascular Disease | Admitting: Cardiovascular Disease

## 2019-02-24 ENCOUNTER — Inpatient Hospital Stay (HOSPITAL_COMMUNITY): Admit: 2019-02-24 | Payer: Medicare Other

## 2019-02-24 ENCOUNTER — Other Ambulatory Visit (HOSPITAL_COMMUNITY): Admit: 2019-02-24 | Payer: Medicare Other

## 2019-02-24 DIAGNOSIS — I442 Atrioventricular block, complete: Secondary | ICD-10-CM

## 2019-02-24 DIAGNOSIS — Z8673 Personal history of transient ischemic attack (TIA), and cerebral infarction without residual deficits: Secondary | ICD-10-CM | POA: Insufficient documentation

## 2019-02-24 DIAGNOSIS — N189 Chronic kidney disease, unspecified: Secondary | ICD-10-CM | POA: Insufficient documentation

## 2019-02-24 DIAGNOSIS — Z794 Long term (current) use of insulin: Secondary | ICD-10-CM | POA: Diagnosis not present

## 2019-02-24 DIAGNOSIS — Z01811 Encounter for preprocedural respiratory examination: Secondary | ICD-10-CM | POA: Insufficient documentation

## 2019-02-24 DIAGNOSIS — Z8619 Personal history of other infectious and parasitic diseases: Secondary | ICD-10-CM | POA: Insufficient documentation

## 2019-02-24 DIAGNOSIS — I5032 Chronic diastolic (congestive) heart failure: Secondary | ICD-10-CM | POA: Diagnosis not present

## 2019-02-24 DIAGNOSIS — E1122 Type 2 diabetes mellitus with diabetic chronic kidney disease: Secondary | ICD-10-CM | POA: Insufficient documentation

## 2019-02-24 DIAGNOSIS — Z951 Presence of aortocoronary bypass graft: Secondary | ICD-10-CM | POA: Diagnosis not present

## 2019-02-24 DIAGNOSIS — I080 Rheumatic disorders of both mitral and aortic valves: Secondary | ICD-10-CM | POA: Diagnosis not present

## 2019-02-24 DIAGNOSIS — I130 Hypertensive heart and chronic kidney disease with heart failure and stage 1 through stage 4 chronic kidney disease, or unspecified chronic kidney disease: Secondary | ICD-10-CM | POA: Insufficient documentation

## 2019-02-24 DIAGNOSIS — Z853 Personal history of malignant neoplasm of breast: Secondary | ICD-10-CM | POA: Diagnosis not present

## 2019-02-24 DIAGNOSIS — E785 Hyperlipidemia, unspecified: Secondary | ICD-10-CM | POA: Diagnosis not present

## 2019-02-24 DIAGNOSIS — Y831 Surgical operation with implant of artificial internal device as the cause of abnormal reaction of the patient, or of later complication, without mention of misadventure at the time of the procedure: Secondary | ICD-10-CM | POA: Diagnosis not present

## 2019-02-24 DIAGNOSIS — I2510 Atherosclerotic heart disease of native coronary artery without angina pectoris: Secondary | ICD-10-CM | POA: Insufficient documentation

## 2019-02-24 DIAGNOSIS — T82857A Stenosis of cardiac prosthetic devices, implants and grafts, initial encounter: Secondary | ICD-10-CM | POA: Insufficient documentation

## 2019-02-24 DIAGNOSIS — Z950 Presence of cardiac pacemaker: Secondary | ICD-10-CM | POA: Diagnosis not present

## 2019-02-24 HISTORY — DX: Presence of cardiac pacemaker: Z95.0

## 2019-02-24 LAB — CUP PACEART INCLINIC DEVICE CHECK
Battery Remaining Longevity: 132 mo
Battery Voltage: 3.09 V
Brady Statistic AP VP Percent: 3.47 %
Brady Statistic AP VS Percent: 0 %
Brady Statistic AS VP Percent: 96.35 %
Brady Statistic AS VS Percent: 0.18 %
Brady Statistic RA Percent Paced: 3.6 %
Brady Statistic RV Percent Paced: 99.82 %
Date Time Interrogation Session: 20201231112731
Implantable Lead Implant Date: 20201216
Implantable Lead Implant Date: 20201216
Implantable Lead Location: 753859
Implantable Lead Location: 753860
Implantable Lead Model: 5076
Implantable Lead Model: 5076
Implantable Pulse Generator Implant Date: 20201216
Lead Channel Impedance Value: 285 Ohm
Lead Channel Impedance Value: 380 Ohm
Lead Channel Impedance Value: 399 Ohm
Lead Channel Impedance Value: 475 Ohm
Lead Channel Pacing Threshold Amplitude: 0.625 V
Lead Channel Pacing Threshold Amplitude: 1 V
Lead Channel Pacing Threshold Pulse Width: 0.4 ms
Lead Channel Pacing Threshold Pulse Width: 0.4 ms
Lead Channel Sensing Intrinsic Amplitude: 1.25 mV
Lead Channel Sensing Intrinsic Amplitude: 1.375 mV
Lead Channel Sensing Intrinsic Amplitude: 27.75 mV
Lead Channel Setting Pacing Amplitude: 3.5 V
Lead Channel Setting Pacing Amplitude: 3.5 V
Lead Channel Setting Pacing Pulse Width: 0.4 ms
Lead Channel Setting Sensing Sensitivity: 0.9 mV

## 2019-02-24 LAB — COMPREHENSIVE METABOLIC PANEL
ALT: 13 U/L (ref 0–44)
AST: 16 U/L (ref 15–41)
Albumin: 3.4 g/dL — ABNORMAL LOW (ref 3.5–5.0)
Alkaline Phosphatase: 125 U/L (ref 38–126)
Anion gap: 10 (ref 5–15)
BUN: 22 mg/dL (ref 8–23)
CO2: 24 mmol/L (ref 22–32)
Calcium: 9.2 mg/dL (ref 8.9–10.3)
Chloride: 104 mmol/L (ref 98–111)
Creatinine, Ser: 1.31 mg/dL — ABNORMAL HIGH (ref 0.44–1.00)
GFR calc Af Amer: 46 mL/min — ABNORMAL LOW (ref >60)
GFR calc non Af Amer: 40 mL/min — ABNORMAL LOW (ref >60)
Glucose, Bld: 134 mg/dL — ABNORMAL HIGH (ref 70–99)
Potassium: 4.3 mmol/L (ref 3.5–5.1)
Sodium: 138 mmol/L (ref 135–145)
Total Bilirubin: 0.6 mg/dL (ref 0.3–1.2)
Total Protein: 7.8 g/dL (ref 6.5–8.1)

## 2019-02-24 LAB — NO BLOOD PRODUCTS

## 2019-02-24 LAB — BLOOD GAS, ARTERIAL
Acid-Base Excess: 1.9 mmol/L (ref 0.0–2.0)
Bicarbonate: 26.4 mmol/L (ref 20.0–28.0)
Drawn by: 421801
FIO2: 21
O2 Saturation: 96.5 %
Patient temperature: 37
pCO2 arterial: 44.9 mmHg (ref 32.0–48.0)
pH, Arterial: 7.388 (ref 7.350–7.450)
pO2, Arterial: 83.2 mmHg (ref 83.0–108.0)

## 2019-02-24 LAB — CBC
HCT: 37.1 % (ref 36.0–46.0)
Hemoglobin: 11.8 g/dL — ABNORMAL LOW (ref 12.0–15.0)
MCH: 30 pg (ref 26.0–34.0)
MCHC: 31.8 g/dL (ref 30.0–36.0)
MCV: 94.4 fL (ref 80.0–100.0)
Platelets: 335 10*3/uL (ref 150–400)
RBC: 3.93 MIL/uL (ref 3.87–5.11)
RDW: 13.3 % (ref 11.5–15.5)
WBC: 9 10*3/uL (ref 4.0–10.5)
nRBC: 0 % (ref 0.0–0.2)

## 2019-02-24 LAB — HEMOGLOBIN A1C
Hgb A1c MFr Bld: 6.6 % — ABNORMAL HIGH (ref 4.8–5.6)
Mean Plasma Glucose: 142.72 mg/dL

## 2019-02-24 LAB — APTT: aPTT: 32 seconds (ref 24–36)

## 2019-02-24 LAB — SURGICAL PCR SCREEN
MRSA, PCR: NEGATIVE
Staphylococcus aureus: NEGATIVE

## 2019-02-24 LAB — PROTIME-INR
INR: 1 (ref 0.8–1.2)
Prothrombin Time: 12.9 seconds (ref 11.4–15.2)

## 2019-02-24 LAB — GLUCOSE, CAPILLARY: Glucose-Capillary: 150 mg/dL — ABNORMAL HIGH (ref 70–99)

## 2019-02-24 LAB — BRAIN NATRIURETIC PEPTIDE: B Natriuretic Peptide: 408.4 pg/mL — ABNORMAL HIGH (ref 0.0–100.0)

## 2019-02-24 NOTE — Progress Notes (Signed)
PCP - Dr. Lynnae January Cardiologist - Dr. Meda Coffee EP - Dr. Curt Bears  PPM/ICD - PPM, inserted 02/09/2019 Device Orders -  Received, printed and placed in chart Rep Notified -   Chest x-ray -  02/24/2019 EKG - 02/10/2019 Stress Test -  ECHO - 02/08/2019 Cardiac Cath - 02/09/2019  Sleep Study - n/a CPAP -   Fasting Blood Sugar -  Checks Blood Sugar _____ times a day  Blood Thinner Instructions: Aspirin Instructions: continue, do not take DOS  ERAS Protcol - PRE-SURGERY Ensure or G2-   COVID TEST- DOS   Anesthesia review: yes, cardiac history  Patient denies shortness of breath, fever, cough and chest pain at PAT appointment   All instructions explained to the patient, with a verbal understanding of the material. Patient agrees to go over the instructions while at home for a better understanding. Patient also instructed to self quarantine after being tested for COVID-19. The opportunity to ask questions was provided.

## 2019-02-24 NOTE — Progress Notes (Signed)
Wound check appointment. Steri-strips removed. Wound without redness or edema. Incision edges approximated, wound well healed. Normal device function. Thresholds, sensing, and impedances consistent with implant measurements. Device programmed at 3.5V for extra safety margin until 3 month visit. Histogram distribution appropriate for patient and level of activity. No mode switches or high ventricular rates noted. Patient educated about wound care, arm mobility, lifting restrictions. ROV in 3 months with Dr. Curt Bears

## 2019-02-24 NOTE — Telephone Encounter (Signed)
Called pt and she was in a appt with [facility] for a procedure. Stated she was coming to appt at 42.

## 2019-02-25 ENCOUNTER — Other Ambulatory Visit: Payer: Self-pay | Admitting: Physician Assistant

## 2019-02-25 DIAGNOSIS — Z952 Presence of prosthetic heart valve: Secondary | ICD-10-CM

## 2019-02-28 ENCOUNTER — Ambulatory Visit: Payer: Medicare Other | Admitting: Internal Medicine

## 2019-02-28 MED ORDER — SODIUM CHLORIDE 0.9 % IV SOLN
1.5000 g | INTRAVENOUS | Status: AC
  Administered 2019-03-01: 1.5 g via INTRAVENOUS
  Filled 2019-02-28: qty 1.5

## 2019-02-28 MED ORDER — VANCOMYCIN HCL 1250 MG/250ML IV SOLN
1250.0000 mg | INTRAVENOUS | Status: AC
  Administered 2019-03-01: 1250 mg via INTRAVENOUS
  Filled 2019-02-28 (×2): qty 250

## 2019-02-28 MED ORDER — DEXMEDETOMIDINE HCL IN NACL 400 MCG/100ML IV SOLN
0.1000 ug/kg/h | INTRAVENOUS | Status: AC
  Administered 2019-03-01: .8 ug/kg/h via INTRAVENOUS
  Administered 2019-03-01: 37.3 ug via INTRAVENOUS
  Filled 2019-02-28: qty 100

## 2019-02-28 MED ORDER — MAGNESIUM SULFATE 50 % IJ SOLN
40.0000 meq | INTRAMUSCULAR | Status: DC
  Filled 2019-02-28: qty 9.85

## 2019-02-28 MED ORDER — POTASSIUM CHLORIDE 2 MEQ/ML IV SOLN
80.0000 meq | INTRAVENOUS | Status: DC
  Filled 2019-02-28: qty 40

## 2019-02-28 MED ORDER — NOREPINEPHRINE 4 MG/250ML-% IV SOLN
0.0000 ug/min | INTRAVENOUS | Status: DC
  Filled 2019-02-28: qty 250

## 2019-02-28 MED ORDER — SODIUM CHLORIDE 0.9 % IV SOLN
INTRAVENOUS | Status: DC
  Filled 2019-02-28 (×2): qty 30

## 2019-02-28 NOTE — H&P (Signed)
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 total MAR Hold     Sedation Time  Sedation Time Physician-1: 32 minutes 51 seconds        Contrast  Medication Name Total Dose  iohexol (OMNIPAQUE) 350 MG/ML injection 33 mL     Radiation/Fluoro  Fluoro time: 9.7 (min)  DAP: 9.9 (Gycm2)  Cumulative Air Kerma: 196.5 (mGy)        Coronary Findings  Diagnostic  Dominance: Right  Left Main  Vessel is large. There is mild diffuse disease throughout the vessel.   Left Anterior Descending  There is mild diffuse disease throughout the vessel.   Left Circumflex  There is mild diffuse disease throughout the vessel.   Right Coronary Artery  Prox RCA to Mid RCA lesion 100% stenosed  Prox RCA to Mid RCA lesion is 100% stenosed.   Saphenous Graft To RPDA  SVG and is normal in caliber. The graft exhibits no disease. SVG-PDA patent  Intervention  No interventions have been documented.                             Coronary Diagrams  Diagnostic  Dominance: Right  &&&&&  Intervention       Implants  No implant documentation for this case.      Syngo Images Link to Procedure Log  Show images for CARDIAC CATHETERIZATION Procedure Log     Images on Long Term Storage   Show images for Kathey, Simer            Hemo Data    Most Recent Value  Fick Cardiac Output 3.8 L/min  Fick Cardiac Output Index 2.17 (L/min)/BSA  RA A Wave 15 mmHg  RA V Wave 14 mmHg  RA Mean 12 mmHg  RV Systolic Pressure 50 mmHg  RV Diastolic Pressure 4 mmHg  RV EDP 11 mmHg  PA Systolic Pressure 51 mmHg  PA Diastolic Pressure 5 mmHg  PA Mean 26 mmHg  PW A Wave 25 mmHg  PW V Wave 29 mmHg  PW Mean 25 mmHg  AO Systolic Pressure 149 mmHg  AO Diastolic Pressure 44 mmHg  AO Mean 65 mmHg  QP/QS 1  TPVR Index 11.98 HRUI  TSVR Index 29.94 HRUI  PVR SVR Ratio 0.02  TPVR/TSVR Ratio 0.4    ADDENDUM REPORT: 02/10/2019 16:53  CLINICAL DATA: Bioprosthetic aortic valve stenosis  EXAM:  Cardiac TAVR CT  TECHNIQUE:  The patient was scanned on a Siemens Force 702 slice scanner. A 120  kV retrospective scan was triggered in the descending thoracic aorta  at 111 HU's. Gantry rotation speed was 270 msecs and collimation was  .9 mm. No beta blockade or nitro were given. The 3D data set was  reconstructed in 5% intervals of the R-R cycle. Systolic and  diastolic phases were analyzed on a dedicated work station using  MPR, MIP and VRT modes. The patient received 162mL OMNIPAQUE IOHEXOL  350 MG/ML SOLN of contrast.  FINDINGS:  Aortic Valve: Bioprosthetic aortic valve. 21 mm  Magna Ease  pericardial prosthesis, 2006. Moderately restricted leaflet motion  with moderately calcified leaflets.  Valve measures 20 x 20 mm. Moderately calcified leaflets. Posterior  leaflet of the valve has restricted motion.  No LVOT calcifications.  Aorta: Mild calcifications in the thoracic aorta.  Normal 3 vessel  branch pattern of the aortic arch.  Sinotubular Junction: 28 mm  Ascending Thoracic Aorta: 37 mm  Aortic Arch: 32 mm  Descending Thoracic Aorta: 25 mm  Coronary Artery Height above Annulus:  Left Main: 8.9 mm  Right Coronary: 2.7 mm  The leaflet of the bioprosthesis adjacent to the left main coronary  origin has restricted motion. The left coronary sinus is large, and  coronary ostium distance is adequate. The leaflet is unlikely to  obstruct the coronary ostium.  The right coronary artery arises low on the right coronary sinus. It  is adjacent to the struts of the bioprosthetic valve. It is known to  be occluded and there is a patent SVG-RCA graft seen on this exam.  Prosthesis to LM distance - 32mm  Prosthesis to RCA distance - 2 mm. The RCA is known to be occluded  and there is a patent SVG-RCA graft noted.  Coronary Arteries: Three vessel coronary artery calcifications. SVG  to RCA graft seen arising from mid ascending aorta.  For valve in valve TAVR, this valve would be suited for a 23 mm  Evolut Core Valve.  OTHER:  Severe mitral annular calcifications.  No left atrial appendage thrombus.  Normal pulmonary vein anatomy.  IMPRESSION:  1. 21 mm Magna Ease bioprosthetic aortic valve with severe stenosis.  Moderately reduced leaflet excursion with moderately calcified  leaflets.  2. Normal ascending aorta dimensions.  3. Adequate left coronary height and distance from prosthesis for  placement of a 23 mm Evolut Core Valve in a valve in valve fashion.  4. Severe mitral annular calcification.  Electronically Signed  By: Cherlynn Kaiser  On: 02/10/2019 16:53  CLINICAL DATA: Inpatient. Aortic valve replacement and CABG 2006.  Severe symptomatic aortic stenosis. Normal EF. Pre-TAVR evaluation.  Remote history of left breast cancer.  EXAM:  CT ANGIOGRAPHY CHEST, ABDOMEN AND PELVIS  TECHNIQUE:  Multidetector CT imaging through the chest, abdomen and pelvis was    performed using the standard protocol during bolus administration of  intravenous contrast. Multiplanar reconstructed images and MIPs were  obtained and reviewed to evaluate the vascular anatomy.  CONTRAST: 111mL OMNIPAQUE IOHEXOL 350 MG/ML SOLN  COMPARISON: 07/08/2004 chest and abdomen CT angiogram.  FINDINGS:  CTA CHEST FINDINGS  Cardiovascular: Mild cardiomegaly. Right subclavian 2 lead pacemaker  with lead tips in the right atrium and interventricular septum. Mild  subcutaneous emphysema surrounding the pacemaker device in the  ventral upper right chest wall, compatible with recent device  placement. No fluid collections. No significant pericardial  effusion/thickening. Left main and 3 vessel coronary atherosclerosis  status post CABG. Aortic valve prosthesis in place. Atherosclerotic  nonaneurysmal thoracic aorta. Top-normal caliber main pulmonary  artery (3.2 cm diameter). No central pulmonary emboli.  Mediastinum/Nodes: No discrete thyroid nodules. Unremarkable  esophagus. No pathologically enlarged axillary, mediastinal or hilar  lymph nodes. Surgical clips noted in the left axilla.  Lungs/Pleura: No pneumothorax. No pleural effusion. No acute  consolidative airspace disease, lung masses or significant pulmonary  nodules. Mosaic attenuation throughout both lungs.  Musculoskeletal: No aggressive appearing focal osseous lesions.  Intact sternotomy wires. Moderate thoracic spondylosis. Moderate T11  vertebral compression fracture, chronic and stable since 06/22/2017  thoracic spine  radiographs.  CTA ABDOMEN AND PELVIS FINDINGS  Hepatobiliary: Normal liver with no liver mass. Cholecystectomy.  Bile ducts are within normal post cholecystectomy limits with CBD  diameter 7 mm.  Pancreas: Normal, with no mass or duct dilation.  Spleen: Normal size. No mass.  Adrenals/Urinary Tract: Normal adrenals. Simple 4.8 cm lower right  renal cyst. No additional contour deforming renal  lesions. No  hydronephrosis. Normal bladder.  Stomach/Bowel: Small hiatal hernia. Otherwise normal stomach. Normal  caliber small bowel with no small bowel wall thickening. Normal  appendix. Mild sigmoid diverticulosis, with no large bowel wall  thickening or significant pericolonic fat stranding.  Vascular/Lymphatic: Atherosclerotic nonaneurysmal abdominal aorta.  Patent splenic and renal veins. No pathologically enlarged lymph  nodes in the abdomen or pelvis.  Reproductive: Status post hysterectomy, with no abnormal findings at  the vaginal cuff. No adnexal mass.  Other: No pneumoperitoneum, ascites or focal fluid collection.  Musculoskeletal: No aggressive appearing focal osseous lesions.  Moderate lumbar spondylosis.  VASCULAR MEASUREMENTS PERTINENT TO TAVR:  AORTA:  Minimal Aortic Diameter-14.0 x 13.8 mm  Severity of Aortic Calcification-moderate  RIGHT PELVIS:  Right Common Iliac Artery -  Minimal Diameter-8.8 x 8.6 mm  Tortuosity-moderate  Calcification-moderate  Right External Iliac Artery -  Minimal Diameter-7.7 x 7.1 mm  Tortuosity-mild  Calcification-mild  Right Common Femoral Artery -  Minimal Diameter-7.7 x 7.3 mm  Tortuosity-mild  Calcification-mild  LEFT PELVIS:  Left Common Iliac Artery -  Minimal Diameter-9.6 x 9.2 mm  Tortuosity-moderate  Calcification-moderate  Left External Iliac Artery -  Minimal Diameter-6.9 x 6.8 mm  Tortuosity-mild  Calcification-mild  Left Common Femoral Artery -  Minimal Diameter-7.4 x 6.7 mm  Tortuosity-mild  Calcification-mild  Review of the MIP images confirms the above findings.  IMPRESSION:  1. Vascular findings and measurements pertinent to potential TAVR  procedure, as detailed.  2. Mild cardiomegaly. Left main and 3 vessel coronary  atherosclerosis status post CABG.  3. Mosaic attenuation throughout both lungs, nonspecific, which  could be due to mosaic perfusion from pulmonary vascular disease  versus air trapping  from small airways disease.  4. Small hiatal hernia.  5. Mild sigmoid diverticulosis.  6. Aortic Atherosclerosis (ICD10-I70.0).  Electronically Signed  By: Ilona Sorrel M.D.  On: 02/10/2019 16:51    STS Risk Calculator:   Procedure: AV Replacement  Procedure: Isolated AVR  Risk of Mortality: 10.295%  Renal Failure: 22.442%  Permanent Stroke: 5.507%  Prolonged Ventilation: 42.619%  DSW Infection: 0.335%  Reoperation: 7.875%  Morbidity or Mortality: 47.062%  Short Length of Stay: 6.867%  Long Length of Stay: 24.419%   Impression:   This 74 year old woman has stage D, severe, symptomatic prosthetic aortic valve stenosis and was admitted with New [place] Heart Association class IV symptoms of exertional shortness of breath and fatigue as well as orthopnea and had a BNP of 2131 on admission consistent with acute on chronic diastolic congestive heart failure. He was also noted to be in complete heart block with marked bradycardia on admission. She improved with intravenous diuresis. She has had a permanent dual-chamber pacemaker placed and said that she feels much better. I have personally reviewed her 2D echocardiogram, cardiac catheterization, and CTA studies. Her echocardiogram shows a mean gradient across aortic valve of 55.5 mmHg which has significantly increased from her prior echocardiogram on 06/07/2017 when her mean gradient was 28 mmHg. She also has severe mitral annular calcification, a calcified degenerative mitral valve and moderate to severe mitral valve stenosis with a mean gradient  of 10 mmHg which is increased from her previous echocardiogram where her gradient was 5 mmHg. Left ventricular systolic function is normal. Cardiac catheterization shows severe single-vessel disease with occlusion of the mid right coronary artery and continued patency of the saphenous vein graft to the posterior descending artery. There is no other significant coronary disease. She is symptomatically much  better following permanent pacemaker but I agree that aortic valve replacement is indicated in this patient with severe prosthetic aortic valve stenosis. She is definitely not a candidate for redo sternotomy and redo open surgical aortic valve replacement given her multiple comorbidities and Jehovah's Witness status. She has a 21 mm [facility] valve which will be suitable for valve in valve TAVR using a 23 mm Medtronic Evolut valve. Her gated cardiac CTA shows anatomy suitable for valve in valve TAVR. Her abdominal pelvic CTA shows adequate pelvic vascular anatomy to allow transfemoral insertion.   The patient was counseled at length regarding treatment alternatives for management of severe symptomatic prosthetic aortic valve stenosis. The risks and benefits of surgical intervention has been discussed in detail. Long-term prognosis with medical therapy was discussed. Alternative approaches such as conventional surgical aortic valve replacement, transcatheter aortic valve replacement, and palliative medical therapy were compared and contrasted at length. This discussion was placed in the context of the patient's own specific clinical presentation and past medical history. All of her questions have been addressed.   Following the decision to proceed with transcatheter aortic valve replacement, a discussion was held regarding what types of management strategies would be attempted intraoperatively in the event of life-threatening complications, including whether or not the patient would be considered a candidate for the use of cardiopulmonary bypass and/or conversion to open sternotomy for attempted surgical intervention. I do not think she would be a candidate for emergent redo sternotomy to manage any intraoperative complications. The patient has been advised of a variety of complications that might develop including but not limited to risks of death, stroke, paravalvular leak, aortic dissection or other major  vascular complications, aortic annulus rupture, device embolization, cardiac rupture or perforation, mitral regurgitation, acute myocardial infarction, arrhythmia, heart block or bradycardia requiring permanent pacemaker placement, congestive heart failure, respiratory failure, renal failure, pneumonia, infection, other late complications related to structural valve deterioration or migration, or other complications that might ultimately cause a temporary or permanent loss of functional independence or other long term morbidity. The patient provides full informed consent for the procedure as described and all questions were answered.    Plan:   Transfemoral valve in valve TAVR using a Medtronic valve.  Gaye Pollack, MD

## 2019-02-28 NOTE — Anesthesia Preprocedure Evaluation (Addendum)
Anesthesia Evaluation    Reviewed: Allergy & Precautions, Patient's Chart, lab work & pertinent test results  History of Anesthesia Complications Negative for: history of anesthetic complications  Airway Mallampati: II  TM Distance: >3 FB Neck ROM: Full    Dental no notable dental hx. (+) Dental Advisory Given   Pulmonary neg pulmonary ROS,    Pulmonary exam normal        Cardiovascular hypertension, + CAD, + CABG, + Peripheral Vascular Disease and +CHF  Normal cardiovascular exam+ dysrhythmias Atrial Fibrillation + pacemaker + Valvular Problems/Murmurs AS, AI and MR   CV: CT coronary 02/10/19: IMPRESSION: 1. 21 mm Magna Ease bioprosthetic aortic valve with severe stenosis. Moderately reduced leaflet excursion with moderately calcified leaflets. 2. Normal ascending aorta dimensions. 3. Adequate left coronary height and distance from prosthesis for placement of a 23 mm Evolut Core Valve in a valve in valve fashion. 4. Severe mitral annular calcification.   Cardiac cath 02/09/19: 1. Severe single-vessel coronary artery disease with total occlusion of the mid RCA and patency of the saphenous vein graft PDA 2. Patent left main, LAD, and left circumflex with mild diffuse irregularity but no significant stenoses 3. Known critical bioprosthetic aortic stenosis, valve not crossed during this procedure 4. Mild pulmonary hypertension with mean PA pressure 26 mmHg, secondary to left heart disease (pulmonary capillary wedge pressure mean 25 mmHg) Plan: Multidisciplinary heart valve team review, EP consultation for consideration of permanent pacemaker in this patient with complete heart block and left bundle branch block and pending TAVR valve in valve.   Echo 02/08/19: IMPRESSIONS 1. Left ventricular ejection fraction, by visual estimation, is 70 to 75%. The left ventricle has hyperdynamic function. There is moderately increased  left ventricular hypertrophy. 2. Abnormal septal motion consistent with post-operative status. 3. Elevated left atrial pressure. 4. The left ventricle has no regional wall motion abnormalities. 5. Global right ventricle has normal systolic function.The right ventricular size is normal. No increase in right ventricular wall thickness. 6. Left atrial size was severely dilated. 7. Right atrial size was mildly dilated. 8. Severe mitral annular calcification. 9. The mitral valve is degenerative. Mild mitral valve regurgitation. Moderate-severe mitral stenosis. 10. MV mean gradient, 10 mmHg at 45 bpm. Calculated area is 1.4 cm sq by PHT and 1.1 cm sq by continuity equation. 11. Severe aortic valve bioprosthesis stenosis. Mean gradient is 55 mm Hg, dimensionless obstructive index 0.24. 12. Aortic valve regurgitation is mild. 13. The tricuspid valve is normal in structure. Tricuspid valve regurgitation is mild. 14. Pulmonic regurgitation is mild. 15. The pulmonic valve was grossly normal. Pulmonic valve regurgitation is mild. 16. Severely elevated pulmonary artery systolic pressure. 17. The tricuspid regurgitant velocity is 3.97 m/s, and with an assumed right atrial pressure of 15 mmHg, the estimated right ventricular systolic pressure is severely elevated at 78.0 mmHg. 18. The inferior vena cava is dilated in size with <50% respiratory variability, suggesting right atrial pressure of 15 mmHg.     Neuro/Psych PSYCHIATRIC DISORDERS Depression CVA    GI/Hepatic negative GI ROS, Neg liver ROS,   Endo/Other  negative endocrine ROSdiabetes  Renal/GU Renal InsufficiencyRenal disease     Musculoskeletal negative musculoskeletal ROS (+)   Abdominal   Peds  Hematology  (+) JEHOVAH'S WITNESS  Anesthesia Other Findings Day of surgery medications reviewed with the patient.  Reproductive/Obstetrics                          Anesthesia Physical Anesthesia  Plan  ASA: IV  Anesthesia Plan: MAC   Post-op Pain Management:    Induction: Intravenous  PONV Risk Score and Plan:   Airway Management Planned: Natural Airway and Simple Face Mask  Additional Equipment: Arterial line  Intra-op Plan:   Post-operative Plan:   Informed Consent:   Plan Discussed with: Anesthesiologist  Anesthesia Plan Comments: (PAT note written 02/28/2019 by Myra Gianotti, PA-C. Has Medtronic PPM.  Same day COVID-19 test (03/01/19).  )       Anesthesia Quick Evaluation

## 2019-02-28 NOTE — Progress Notes (Signed)
HEART AND VASCULAR CENTER   MULTIDISCIPLINARY HEART VALVE TEAM  Virtual Visit via Telephone Note   This visit type was conducted due to national recommendations for restrictions regarding the COVID-19 Pandemic (e.g. social distancing) in an effort to limit this patient's exposure and mitigate transmission in our community.  Due to her co-morbid illnesses, this patient is at least at moderate risk for complications without adequate follow up.  This format is felt to be most appropriate for this patient at this time.  The patient did not have access to video technology/had technical difficulties with video requiring transitioning to audio format only (telephone).  All issues noted in this document were discussed and addressed.  No physical exam could be performed with this format.  Please refer to the patient's chart for her  consent to telehealth for [facility].   Evaluation Performed:  Follow-up visit  Date:  03/09/2019   ID:  [name], DOB September 02, 1945, MRN [id number]  Patient Location: [place] Provider Location: Office  PCP:  [name], MD  Cardiologist:  [name], MD / Dr. Burt Knack & Dr. Cyndia Bent (TAVR) Electrophysiologist: Dr. Curt Bears    Chief Complaint:  TOC s/p TAVR   History of Present Illness:    [name] is a 74 y.o. female with a history of CAD and aortic stenosis s/p pericardial tissue AVR & CABGx1 (SVG--> RCA) in 2006, post operative CVA, CKD stage III (creat baseline ~1.5), IDDM, anemia, chronic diastolic CHF, breast CA s/p mastectomy and tamoxifen, HTN, HLD, Jehovah's witness faith, CHB s/p PPM (12/16), severe mitral stenosis and severe AS s/p TAVR (03/01/19) who presents via telemedicine for follow up.   The patient does not have symptoms concerning for COVID-19 infection (fever, chills, cough, or new shortness of breath).   She was recently admitted for CHB and acute CHF. She underwent successful PPM placement and found to have  severe AS with a mean gradient of >50 mm Hg. She was discharge to a [facility] with plans for staged TAVR.  She underwent successful TAVR with a  Medtronic Evolut Pro Plus 23 mm THV via the TF approach on 03/01/19. Post op echo showed normally functioning TAVR with a mean gradient of 9 mm Hg and no PVL, moderate MS. She was discharged back to previous SNF.  Today she presents for follow up via virtual medicine. No CP or SOB. Mild LE edema but no orthopnea or PND. No dizziness or syncope. No blood in stool or urine. No palpitations.    Past Medical History:  Diagnosis Date  . Adenocarcinoma of breast ([place]) 1996   Completed tamoxifen and had mastectomy.  . Aortic stenosis, severe    s/p aortic valve replacement with porcine valve 06/2004.  ECHO 2010 EF 57%, LVH, diastolic dysfxn, Bioprostetic aoritc valve, mild AS. ECHO 2013 EF 60%, Nl aortic artificial valve, dynamic obstruction in the outflow tract   Class IIb rec for annual TTE after 5 yrs. She had a TTE 2013    . Arthritis   . CAD (coronary artery disease) 2006   s/p CABG (5/06) w/ saphenous vein to RCA at time of AVR  . CKD (chronic kidney disease) stage 2, GFR 60-89 ml/min 06/03/2011   She is no longer taking NSAIDs.   Marland Kitchen CVA (cerebral infarction) 2006   Post-op from AVR. Presumed embolic in nature. Carotid stenosis of R 60-79%. Repeat dopplers 4/10 no R stenosis and L stenosis of 1-29%.  . Depression    Controlled  on Paxil  . Diabetes mellitus 1992   Dx 04/25/1990. Now insulin dependent, started 2008. On ACEI.   . Diverticulosis 2001  . History of 2019 novel coronavirus disease (COVID-19)   . Hyperlipidemia    Mgmt with a statin  . Hypertension    Requires 4 drug tx  . Osteoporosis 2006   DEXA 10/06 : L femur T -2.8, R -2.7. Lumbar T -2.4. On bisphosphonates and  Calcium / Vit D.  . Presence of permanent cardiac pacemaker   . Refusal of blood transfusions as patient is Jehovah's Witness   . Stroke [facility])    Past Surgical  History:  Procedure Laterality Date  . ABDOMINAL HYSTERECTOMY  1987   for fibroids  . AORTIC VALVE REPLACEMENT  2006  . CHOLECYSTECTOMY    . CORONARY ARTERY BYPASS GRAFT  2006   Saphenous vein to RCA at time of AVR. Course complicated by acute respiratory failure, post-op PTX, ARI, ileus, CVA  . MASTECTOMY Left 1995   L for adenocarcinoma  . PACEMAKER IMPLANT N/A 02/09/2019   Procedure: PACEMAKER IMPLANT;  Surgeon: Constance Haw, MD;  Location: [place] CV LAB;  Service: Cardiovascular;  Laterality: N/A;  . RIGHT HEART CATH AND CORONARY/GRAFT ANGIOGRAPHY N/A 02/09/2019   Procedure: RIGHT HEART CATH AND CORONARY/GRAFT ANGIOGRAPHY;  Surgeon: Sherren Mocha, MD;  Location: [place] CV LAB;  Service: Cardiovascular;  Laterality: N/A;  . TRANSCATHETER AORTIC VALVE REPLACEMENT, TRANSFEMORAL N/A 03/01/2019   Procedure: TRANSCATHETER AORTIC VALVE REPLACEMENT, TRANSFEMORAL;  Surgeon: Sherren Mocha, MD;  Location: [place] CV LAB;  Service: Open Heart Surgery;  Laterality: N/A;     Current Meds  Medication Sig  . Accu-Chek FastClix Lancets MISC Use to check blood sugar 4 times a day. diag code E11.22. insulin dependent  . acetaminophen (TYLENOL) 500 MG tablet Take 2 tablets (1,000 mg total) by mouth every 6 (six) hours as needed.  Marland Kitchen amLODipine (NORVASC) 5 MG tablet TAKE 1 TABLET BY MOUTH EVERY DAY  . aspirin 81 MG EC tablet TAKE 1 TABLET (81 MG TOTAL) BY MOUTH DAILY. SWALLOW WHOLE. (Patient taking differently: Take 81 mg by mouth daily. Swallow whole)  . atorvastatin (LIPITOR) 80 MG tablet Take 1 tablet (80 mg total) by mouth at bedtime.  . clopidogrel (PLAVIX) 75 MG tablet Take 1 tablet (75 mg total) by mouth daily with breakfast.  . furosemide (LASIX) 40 MG tablet Take 1 tablet (40 mg total) by mouth daily.  Marland Kitchen glucose blood (CONTOUR NEXT TEST) test strip Use to check blood sugar 4 times daily. diag code E11.22. insulin dependent  . Insulin Degludec-Liraglutide (XULTOPHY) 100-3.6  UNIT-MG/ML SOPN Inject 10 Units into the skin daily.  . Insulin Pen Needle (PEN NEEDLES 3/16") 31G X 5 MM MISC 1 each by Does not apply route 4 (four) times daily. Use to check blood sugars 4 times a day. Dx code E11.22  . metoprolol succinate (TOPROL-XL) 25 MG 24 hr tablet Take 1 tablet (25 mg total) by mouth daily.  Marland Kitchen PARoxetine (PAXIL) 30 MG tablet TAKE 1 TABLET BY MOUTH DAILY (Patient taking differently: Take 30 mg by mouth daily. )  . polyethylene glycol (MIRALAX / GLYCOLAX) packet Take 17 g by mouth daily. (Patient taking differently: Take 17 g by mouth daily as needed for mild constipation. )     Allergies:   Other   Social History   Tobacco Use  . Smoking status: Never Smoker  . Smokeless tobacco: Never Used  Substance Use Topics  . Alcohol  use: Yes    Alcohol/week: 0.0 standard drinks    Comment: Occasional beer, monthly  . Drug use: No     Family Hx: The patient's family history includes Alzheimer's disease in her mother; Diabetes in her mother; Heart disease (age of onset: 80) in her sister; Heart disease (age of onset: 80) in her father; Hypertension in her mother; Kidney disease in her sister; Mental illness in her sister.  ROS:   Please see the history of present illness.    All other systems reviewed and are negative.   Prior CV studies:   The following studies were reviewed today:  TAVR OPERATIVE NOTE  NICKOLA LENIG [id number]  Date of Procedure: 03/01/2019  Preoperative Diagnosis:Severe Aortic Stenosis   Postoperative Diagnosis:Same   Procedure:   Valve in Valve Transcatheter Aortic Valve Replacement - Percutaneous RightTransfemoral Approach Medtronic Evolut-Pro+ (size 82mm, model # EVPROPLUS-23US, serial # [id number])  Co-Surgeons:Bryan Alveria Apley, MD and Sherren Mocha, MD   Anesthesiologist:James D. Tobias Alexander, MD  Echocardiographer:Peter  Johnsie Cancel, MD  Pre-operative Echo Findings: ? Severe prosthetic aortic valve stenosis  ? Normalleft ventricular systolic function  Post-operative Echo Findings: ? trivialparavalvular leak ? Normalleft ventricular systolic function ? Normal mean prosthetic transvalvular gradient of 4 mm Hg.  _______________  Echo 03/01/18 IMPRESSIONS  1. Left ventricular ejection fraction, by visual estimation, is 65 to 70%. The left ventricle has hyperdynamic function. There is severely increased left ventricular hypertrophy.  2. Elevated left ventricular end-diastolic pressure.  3. Left ventricular diastolic parameters are consistent with Grade I diastolic dysfunction (impaired relaxation).  4. The left ventricle has no regional wall motion abnormalities.  5. Global right ventricle has normal systolic function.The right ventricular size is normal. No increase in right ventricular wall thickness.  6. Left atrial size was moderately dilated.  7. Right atrial size was mildly dilated.  8. Moderate calcification of the mitral valve leaflet(s).  9. Moderate thickening of the mitral valve leaflet(s). 10. Severe mitral annular calcification. 11. The mitral valve is degenerative. Mild mitral valve regurgitation. Moderate mitral stenosis. 12. The tricuspid valve is normal in structure. 13. Aortic valve regurgitation is not visualized. No evidence of aortic valve sclerosis or stenosis. 14. The pulmonic valve was normal in structure. Pulmonic valve regurgitation is not visualized. 15. A pacer wire is visualized. 16. The inferior vena cava is normal in size with greater than 50% respiratory variability, suggesting right atrial pressure of 3 mmHg. 17. Supra-annular valve, 23 Medtronic Evolut valve, stable transaortic gradients peak/mean 16/9 mmHg, trivial PVL. 18. MV peak gradient, 15.8 mmHg.   Labs/Other Tests and Data Reviewed:    EKG:  No ECG reviewed.  Recent Labs: 02/07/2019: TSH 1.630 02/24/2019:  ALT 13; B Natriuretic Peptide 408.4 03/02/2019: BUN 24; Creatinine, Ser 1.47; Hemoglobin 10.9; Magnesium 1.7; Platelets 222; Potassium 3.7; Sodium 140   Recent Lipid Panel Lab Results  Component Value Date/Time   CHOL 99 02/07/2019 08:12 PM   CHOL 148 05/25/2015 11:06 AM   TRIG 117 02/07/2019 08:12 PM   HDL 26 (L) 02/07/2019 08:12 PM   HDL 41 05/25/2015 11:06 AM   CHOLHDL 3.8 02/07/2019 08:12 PM   LDLCALC 50 02/07/2019 08:12 PM   LDLCALC 79 05/25/2015 11:06 AM    Wt Readings from Last 3 Encounters:  03/03/19 164 lb 3.9 oz (74.5 kg)  02/24/19 164 lb 6.4 oz (74.6 kg)  02/14/19 166 lb 3.6 oz (75.4 kg)     Objective:    Vital Signs:  Temp 97.7 F (36.5 C)  SpO2 100%    ASSESSMENT & PLAN:     Severe AS s/p TAVR: doing well. SBE prophylaxis discussed; I will Rx antibiotics at her follow up visit in Feb. Continue on aspirin and Plavix. I will see her back in early February for 1 month follow-up and echocardiogram.  CHB s/p PPM: followed by Dr. Curt Bears.  Chronic diastolic CHF: no s/s CHF aside from some mild LE edema. Continue lasix 40 mg daily. I asked her to call us if edema worsens or she develops SOB.   Mitral stenosis: moderate by post op echo .  COVID-19 Education: The signs and symptoms of COVID-19 were discussed with the patient and how to seek care for testing (follow up with PCP or arrange E-visit).  The importance of social distancing was discussed today.  Time:   Today, I have spent 15 minutes with the patient with telehealth technology discussing the above problems.     Medication Adjustments/Labs and Tests Ordered: Current medicines are reviewed at length with the patient today.  Concerns regarding medicines are outlined above.   Tests Ordered: No orders of the defined types were placed in this encounter.   Medication Changes: No orders of the defined types were placed in this encounter.    Disposition:  Follow up next month   Signed, Angelena Form,  PA-C  03/09/2019 5:12 PM    [place]

## 2019-02-28 NOTE — Progress Notes (Signed)
Anesthesia Chart Review:  Case: [id number] Date/Time: 03/01/19 0900   Procedure: TRANSCATHETER AORTIC VALVE REPLACEMENT, TRANSFEMORAL (N/A )   Anesthesia type: General   Pre-op diagnosis: aortic stenosis   Location: [facility] CATH LAB 6 / [place] INVASIVE CV LAB   Providers: Sherren Mocha, MD    CT Surgeon: Gilford Raid, MD   DISCUSSION: Patient is a 74 year old female scheduled for the above procedure.  History includes never smoker, aortic stenosis, CAD (s/p CABG: SVG-RCA, AVR using 21 mm pericardial valve 07/08/04 for 1V CAD and severe AS; severe bioprosthetic AS and moderate to severe mitral stenosis 01/2019), chronic diastolic CHF, complete heart block (s/p Medtronic [id number] Azure XT DR MRI PPM 02/09/19), HTN, HLD, DM2 (diagnosed 1992; insulin dependent since 2008), breast cancer (s/p left mastectomy, chemotherapy1996), CKD, CVA (post AVR 2006), Jehovah's Witness (refusal of blood products), COVID-19 (+ 07/17/18; admitted with viral pneumonia). Mild pulmonary hypertension by RHC 02/09/19 (secondary to left heart disease, mean PA pressure 26 mmHg; had estimated right ventricular systolic pressure is severely elevated at 78.0 mmHg by 02/08/19 echo).  She could not provide a urine specimen for UA at PAT, so will need to be collected on the day of her procedure. Notes also indicate that she is scheduled for a same day COVID-19 test.    VS: BP (!) 167/89   Pulse 71   Temp 36.9 C   Resp 19   Ht $R'5\' 6"'vw$  (1.676 m)   Wt 74.6 kg   SpO2 98%   BMI 26.53 kg/m    PROVIDERS: [name], MD is PCP [name], MD is cardiologist Curt Bears, Will, MD is EP cardiologist   LABS: Labs reviewed: Acceptable for surgery. Cr 1.31. H/H 11.8/37.1. She signed a refusal of all blood and blood products.  (all labs ordered are listed, but only abnormal results are displayed)  Labs Reviewed  GLUCOSE, CAPILLARY - Abnormal; Notable for the following components:      Result Value   Glucose-Capillary 150 (*)     All other components within normal limits  BRAIN NATRIURETIC PEPTIDE - Abnormal; Notable for the following components:   B Natriuretic Peptide 408.4 (*)    All other components within normal limits  CBC - Abnormal; Notable for the following components:   Hemoglobin 11.8 (*)    All other components within normal limits  COMPREHENSIVE METABOLIC PANEL - Abnormal; Notable for the following components:   Glucose, Bld 134 (*)    Creatinine, Ser 1.31 (*)    Albumin 3.4 (*)    GFR calc non Af Amer 40 (*)    GFR calc Af Amer 46 (*)    All other components within normal limits  HEMOGLOBIN A1C - Abnormal; Notable for the following components:   Hgb A1c MFr Bld 6.6 (*)    All other components within normal limits  SURGICAL PCR SCREEN  APTT  BLOOD GAS, ARTERIAL  PROTIME-INR  NO BLOOD PRODUCTS     IMAGES: CXR 02/23/09: FINDINGS: There is no focal consolidation. There is no pleural effusion or pneumothorax. The heart and mediastinal contours are unremarkable. There is a dual lead cardiac pacemaker. There is evidence of prior CABG. There is no acute osseous abnormality. IMPRESSION: No active cardiopulmonary disease.  CTA chest/abd/pelvis 02/10/19: IMPRESSION: 1. Vascular findings and measurements pertinent to potential TAVR procedure, as detailed. 2. Mild cardiomegaly. Left main and 3 vessel coronary atherosclerosis status post CABG. 3. Mosaic attenuation throughout both lungs, nonspecific, which could be due to mosaic perfusion from pulmonary vascular disease  versus air trapping from small airways disease. 4. Small hiatal hernia. 5. Mild sigmoid diverticulosis. 6.  Aortic Atherosclerosis (ICD10-I70.0).   EKG: 02/10/19: Atrial-sensed ventricular-paced rhythm Abnormal ECG paced rhythm is new Confirmed by Shelva Majestic [phone]) on 02/10/2019 7:11:08 PM   CV: CT coronary 02/10/19: IMPRESSION: 1. 21 mm Magna Ease bioprosthetic aortic valve with severe stenosis. Moderately  reduced leaflet excursion with moderately calcified leaflets. 2.  Normal ascending aorta dimensions. 3. Adequate left coronary height and distance from prosthesis for placement of a 23 mm Evolut Core Valve in a valve in valve fashion. 4.  Severe mitral annular calcification.   Cardiac cath 02/09/19: 1.  Severe single-vessel coronary artery disease with total occlusion of the mid RCA and patency of the saphenous vein graft PDA 2.  Patent left main, LAD, and left circumflex with mild diffuse irregularity but no significant stenoses 3.  Known critical bioprosthetic aortic stenosis, valve not crossed during this procedure 4.  Mild pulmonary hypertension with mean PA pressure 26 mmHg, secondary to left heart disease (pulmonary capillary wedge pressure mean 25 mmHg) Plan: Multidisciplinary heart valve team review, EP consultation for consideration of permanent pacemaker in this patient with complete heart block and left bundle branch block and pending TAVR valve in valve.   Echo 02/08/19: IMPRESSIONS  1. Left ventricular ejection fraction, by visual estimation, is 70 to 75%. The left ventricle has hyperdynamic function. There is moderately increased left ventricular hypertrophy.  2. Abnormal septal motion consistent with post-operative status.  3. Elevated left atrial pressure.  4. The left ventricle has no regional wall motion abnormalities.  5. Global right ventricle has normal systolic function.The right ventricular size is normal. No increase in right ventricular wall thickness.  6. Left atrial size was severely dilated.  7. Right atrial size was mildly dilated.  8. Severe mitral annular calcification.  9. The mitral valve is degenerative. Mild mitral valve regurgitation. Moderate-severe mitral stenosis. 10. MV mean gradient, 10 mmHg at 45 bpm. Calculated area is 1.4 cm sq by PHT and 1.1 cm sq by continuity equation. 11. Severe aortic valve bioprosthesis stenosis. Mean gradient is 55 mm Hg,  dimensionless obstructive index 0.24. 12. Aortic valve regurgitation is mild. 13. The tricuspid valve is normal in structure. Tricuspid valve regurgitation is mild. 14. Pulmonic regurgitation is mild. 15. The pulmonic valve was grossly normal. Pulmonic valve regurgitation is mild. 16. Severely elevated pulmonary artery systolic pressure. 17. The tricuspid regurgitant velocity is 3.97 m/s, and with an assumed right atrial pressure of 15 mmHg, the estimated right ventricular systolic pressure is severely elevated at 78.0 mmHg. 18. The inferior vena cava is dilated in size with <50% respiratory variability, suggesting right atrial pressure of 15 mmHg.   Carotid US 02/09/19: Summary: Right Carotid: Velocities in the right ICA are consistent with a 1-39% stenosis. Left Carotid: Velocities in the left ICA are consistent with a 1-39% stenosis. Vertebrals: Bilateral vertebral arteries demonstrate antegrade flow.   Past Medical History:  Diagnosis Date  . Adenocarcinoma of breast ([place]) 1996   Completed tamoxifen and had mastectomy.  . Aortic stenosis, severe    s/p aortic valve replacement with porcine valve 06/2004.  ECHO 2010 EF 06%, LVH, diastolic dysfxn, Bioprostetic aoritc valve, mild AS. ECHO 2013 EF 60%, Nl aortic artificial valve, dynamic obstruction in the outflow tract   Class IIb rec for annual TTE after 5 yrs. She had a TTE 2013    . Arthritis   . CAD (coronary artery disease) 2006   s/p  CABG (5/06) w/ saphenous vein to RCA at time of AVR  . CKD (chronic kidney disease) stage 2, GFR 60-89 ml/min 06/03/2011   She is no longer taking NSAIDs.   Marland Kitchen CVA (cerebral infarction) 2006   Post-op from AVR. Presumed embolic in nature. Carotid stenosis of R 60-79%. Repeat dopplers 4/10 no R stenosis and L stenosis of 1-29%.  . Depression    Controlled on Paxil  . Diabetes mellitus 1992   Dx 04/25/1990. Now insulin dependent, started 2008. On ACEI.   . Diverticulosis 2001  . Hyperlipidemia    Mgmt  with a statin  . Hypertension    Requires 4 drug tx  . Osteoporosis 2006   DEXA 10/06 : L femur T -2.8, R -2.7. Lumbar T -2.4. On bisphosphonates and  Calcium / Vit D.  . Presence of permanent cardiac pacemaker   . Refusal of blood transfusions as patient is Jehovah's Witness   . Stroke [facility])     Past Surgical History:  Procedure Laterality Date  . ABDOMINAL HYSTERECTOMY  1987   for fibroids  . AORTIC VALVE REPLACEMENT  2006  . CHOLECYSTECTOMY    . CORONARY ARTERY BYPASS GRAFT  2006   Saphenous vein to RCA at time of AVR. Course complicated by acute respiratory failure, post-op PTX, ARI, ileus, CVA  . MASTECTOMY Left 1995   L for adenocarcinoma  . PACEMAKER IMPLANT N/A 02/09/2019   Procedure: PACEMAKER IMPLANT;  Surgeon: Constance Haw, MD;  Location: [place] CV LAB;  Service: Cardiovascular;  Laterality: N/A;  . RIGHT HEART CATH AND CORONARY/GRAFT ANGIOGRAPHY N/A 02/09/2019   Procedure: RIGHT HEART CATH AND CORONARY/GRAFT ANGIOGRAPHY;  Surgeon: Sherren Mocha, MD;  Location: [place] CV LAB;  Service: Cardiovascular;  Laterality: N/A;    MEDICATIONS: . Accu-Chek FastClix Lancets MISC  . acetaminophen (TYLENOL) 500 MG tablet  . amLODipine (NORVASC) 5 MG tablet  . aspirin 81 MG EC tablet  . atorvastatin (LIPITOR) 80 MG tablet  . furosemide (LASIX) 40 MG tablet  . glucose blood (CONTOUR NEXT TEST) test strip  . Insulin Degludec-Liraglutide (XULTOPHY) 100-3.6 UNIT-MG/ML SOPN  . Insulin Pen Needle (PEN NEEDLES 3/16") 31G X 5 MM MISC  . metoprolol succinate (TOPROL-XL) 25 MG 24 hr tablet  . PARoxetine (PAXIL) 30 MG tablet  . polyethylene glycol (MIRALAX / GLYCOLAX) packet   No current facility-administered medications for this encounter.   Derrill Memo ON 03/01/2019] cefUROXime (ZINACEF) 1.5 g in sodium chloride 0.9 % 100 mL IVPB  . [START ON 03/01/2019] dexmedetomidine (PRECEDEX) 400 MCG/100ML (4 mcg/mL) infusion  . [START ON 03/01/2019] heparin 30,000 units/NS 1000 mL  solution for CELLSAVER  . [START ON 03/01/2019] magnesium sulfate (IV Push/IM) injection 40 mEq  . [START ON 03/01/2019] norepinephrine (LEVOPHED) 4mg  in 222mL premix infusion  . [START ON 03/01/2019] potassium chloride injection 80 mEq  . [START ON 03/01/2019] vancomycin (VANCOREADY) IVPB 1250 mg/250 mL     Myra Gianotti, PA-C Surgical Short Stay/Anesthesiology [facility] Phone [phone] [facility] Phone [phone] 02/28/2019 9:45 AM

## 2019-03-01 ENCOUNTER — Inpatient Hospital Stay (HOSPITAL_COMMUNITY)
Admission: RE | Admit: 2019-03-01 | Discharge: 2019-03-01 | Disposition: A | Discharge status: Home / Self Care | Payer: Medicare Other | Source: Home / Self Care | Attending: Physician Assistant | Admitting: Physician Assistant

## 2019-03-01 ENCOUNTER — Encounter (HOSPITAL_COMMUNITY): Payer: Self-pay | Admitting: Cardiovascular Disease

## 2019-03-01 ENCOUNTER — Other Ambulatory Visit: Payer: Self-pay

## 2019-03-01 ENCOUNTER — Inpatient Hospital Stay (HOSPITAL_COMMUNITY): Payer: Medicare Other | Admitting: Vascular Surgery

## 2019-03-01 ENCOUNTER — Inpatient Hospital Stay (HOSPITAL_COMMUNITY)
Admission: RE | Admit: 2019-03-01 | Discharge: 2019-03-03 | DRG: 267 | Disposition: A | Discharge status: Skilled Nursing Facility | Payer: Medicare Other | Attending: Surgery | Admitting: Surgery

## 2019-03-01 ENCOUNTER — Inpatient Hospital Stay (HOSPITAL_COMMUNITY): Payer: Medicare Other

## 2019-03-01 ENCOUNTER — Encounter (HOSPITAL_COMMUNITY)
Admission: RE | Disposition: A | Discharge status: Skilled Nursing Facility | Payer: Medicare Other | Source: Home / Self Care | Attending: Surgery

## 2019-03-01 ENCOUNTER — Inpatient Hospital Stay (HOSPITAL_COMMUNITY): Payer: Medicare Other | Admitting: Certified Registered"

## 2019-03-01 DIAGNOSIS — Z950 Presence of cardiac pacemaker: Secondary | ICD-10-CM

## 2019-03-01 DIAGNOSIS — M810 Age-related osteoporosis without current pathological fracture: Secondary | ICD-10-CM | POA: Diagnosis present

## 2019-03-01 DIAGNOSIS — Z90710 Acquired absence of both cervix and uterus: Secondary | ICD-10-CM | POA: Diagnosis not present

## 2019-03-01 DIAGNOSIS — Z7982 Long term (current) use of aspirin: Secondary | ICD-10-CM | POA: Diagnosis not present

## 2019-03-01 DIAGNOSIS — Z79899 Other long term (current) drug therapy: Secondary | ICD-10-CM

## 2019-03-01 DIAGNOSIS — Z9221 Personal history of antineoplastic chemotherapy: Secondary | ICD-10-CM

## 2019-03-01 DIAGNOSIS — N183 Chronic kidney disease, stage 3 unspecified: Secondary | ICD-10-CM | POA: Diagnosis present

## 2019-03-01 DIAGNOSIS — Y831 Surgical operation with implant of artificial internal device as the cause of abnormal reaction of the patient, or of later complication, without mention of misadventure at the time of the procedure: Secondary | ICD-10-CM | POA: Diagnosis present

## 2019-03-01 DIAGNOSIS — Z8673 Personal history of transient ischemic attack (TIA), and cerebral infarction without residual deficits: Secondary | ICD-10-CM

## 2019-03-01 DIAGNOSIS — Z952 Presence of prosthetic heart valve: Secondary | ICD-10-CM | POA: Diagnosis not present

## 2019-03-01 DIAGNOSIS — Z951 Presence of aortocoronary bypass graft: Secondary | ICD-10-CM | POA: Diagnosis not present

## 2019-03-01 DIAGNOSIS — E785 Hyperlipidemia, unspecified: Secondary | ICD-10-CM | POA: Diagnosis present

## 2019-03-01 DIAGNOSIS — Z853 Personal history of malignant neoplasm of breast: Secondary | ICD-10-CM | POA: Diagnosis not present

## 2019-03-01 DIAGNOSIS — E1151 Type 2 diabetes mellitus with diabetic peripheral angiopathy without gangrene: Secondary | ICD-10-CM | POA: Diagnosis present

## 2019-03-01 DIAGNOSIS — Z8616 Personal history of COVID-19: Secondary | ICD-10-CM

## 2019-03-01 DIAGNOSIS — I350 Nonrheumatic aortic (valve) stenosis: Secondary | ICD-10-CM

## 2019-03-01 DIAGNOSIS — Z794 Long term (current) use of insulin: Secondary | ICD-10-CM | POA: Diagnosis not present

## 2019-03-01 DIAGNOSIS — Z8249 Family history of ischemic heart disease and other diseases of the circulatory system: Secondary | ICD-10-CM

## 2019-03-01 DIAGNOSIS — I2510 Atherosclerotic heart disease of native coronary artery without angina pectoris: Secondary | ICD-10-CM | POA: Diagnosis present

## 2019-03-01 DIAGNOSIS — Z923 Personal history of irradiation: Secondary | ICD-10-CM

## 2019-03-01 DIAGNOSIS — T82857A Stenosis of cardiac prosthetic devices, implants and grafts, initial encounter: Principal | ICD-10-CM | POA: Diagnosis present

## 2019-03-01 DIAGNOSIS — I130 Hypertensive heart and chronic kidney disease with heart failure and stage 1 through stage 4 chronic kidney disease, or unspecified chronic kidney disease: Secondary | ICD-10-CM | POA: Diagnosis present

## 2019-03-01 DIAGNOSIS — Z006 Encounter for examination for normal comparison and control in clinical research program: Secondary | ICD-10-CM

## 2019-03-01 DIAGNOSIS — Z9012 Acquired absence of left breast and nipple: Secondary | ICD-10-CM | POA: Diagnosis not present

## 2019-03-01 DIAGNOSIS — E1122 Type 2 diabetes mellitus with diabetic chronic kidney disease: Secondary | ICD-10-CM | POA: Diagnosis present

## 2019-03-01 DIAGNOSIS — I5032 Chronic diastolic (congestive) heart failure: Secondary | ICD-10-CM | POA: Diagnosis present

## 2019-03-01 DIAGNOSIS — I272 Pulmonary hypertension, unspecified: Secondary | ICD-10-CM | POA: Diagnosis present

## 2019-03-01 DIAGNOSIS — I442 Atrioventricular block, complete: Secondary | ICD-10-CM | POA: Diagnosis present

## 2019-03-01 DIAGNOSIS — Z954 Presence of other heart-valve replacement: Secondary | ICD-10-CM | POA: Diagnosis not present

## 2019-03-01 HISTORY — DX: Personal history of COVID-19: Z86.16

## 2019-03-01 HISTORY — PX: TRANSCATHETER AORTIC VALVE REPLACEMENT, TRANSFEMORAL: SHX6400

## 2019-03-01 LAB — POCT I-STAT, CHEM 8
BUN: 21 mg/dL (ref 8–23)
BUN: 21 mg/dL (ref 8–23)
BUN: 20 mg/dL (ref 8–23)
Calcium, Ion: 1.26 mmol/L (ref 1.15–1.40)
Calcium, Ion: 1.2 mmol/L (ref 1.15–1.40)
Calcium, Ion: 1.21 mmol/L (ref 1.15–1.40)
Chloride: 101 mmol/L (ref 98–111)
Chloride: 104 mmol/L (ref 98–111)
Chloride: 104 mmol/L (ref 98–111)
Creatinine, Ser: 1.1 mg/dL — ABNORMAL HIGH (ref 0.44–1.00)
Creatinine, Ser: 1.1 mg/dL — ABNORMAL HIGH (ref 0.44–1.00)
Creatinine, Ser: 1.1 mg/dL — ABNORMAL HIGH (ref 0.44–1.00)
Glucose, Bld: 143 mg/dL — ABNORMAL HIGH (ref 70–99)
Glucose, Bld: 155 mg/dL — ABNORMAL HIGH (ref 70–99)
Glucose, Bld: 145 mg/dL — ABNORMAL HIGH (ref 70–99)
HCT: 34 % — ABNORMAL LOW (ref 36.0–46.0)
HCT: 32 % — ABNORMAL LOW (ref 36.0–46.0)
HCT: 32 % — ABNORMAL LOW (ref 36.0–46.0)
Hemoglobin: 11.6 g/dL — ABNORMAL LOW (ref 12.0–15.0)
Hemoglobin: 10.9 g/dL — ABNORMAL LOW (ref 12.0–15.0)
Hemoglobin: 10.9 g/dL — ABNORMAL LOW (ref 12.0–15.0)
Potassium: 3.9 mmol/L (ref 3.5–5.1)
Potassium: 4 mmol/L (ref 3.5–5.1)
Potassium: 3.9 mmol/L (ref 3.5–5.1)
Sodium: 140 mmol/L (ref 135–145)
Sodium: 141 mmol/L (ref 135–145)
Sodium: 141 mmol/L (ref 135–145)
TCO2: 28 mmol/L (ref 22–32)
TCO2: 28 mmol/L (ref 22–32)
TCO2: 27 mmol/L (ref 22–32)

## 2019-03-01 LAB — GLUCOSE, CAPILLARY
Glucose-Capillary: 106 mg/dL — ABNORMAL HIGH (ref 70–99)
Glucose-Capillary: 136 mg/dL — ABNORMAL HIGH (ref 70–99)
Glucose-Capillary: 75 mg/dL (ref 70–99)
Glucose-Capillary: 161 mg/dL — ABNORMAL HIGH (ref 70–99)

## 2019-03-01 LAB — RESPIRATORY PANEL BY RT PCR (FLU A&B, COVID)
Influenza A by PCR: NEGATIVE
Influenza B by PCR: NEGATIVE
SARS Coronavirus 2 by RT PCR: NEGATIVE

## 2019-03-01 LAB — POCT ACTIVATED CLOTTING TIME
Activated Clotting Time: 144 seconds
Activated Clotting Time: 296 seconds
Activated Clotting Time: 127 seconds

## 2019-03-01 SURGERY — IMPLANTATION, AORTIC VALVE, TRANSCATHETER, FEMORAL APPROACH
Anesthesia: Monitor Anesthesia Care

## 2019-03-01 MED ORDER — CHLORHEXIDINE GLUCONATE 0.12 % MT SOLN
15.0000 mL | Freq: Once | OROMUCOSAL | Status: AC
  Filled 2019-03-01: qty 15

## 2019-03-01 MED ORDER — ATORVASTATIN CALCIUM 80 MG PO TABS
80.0000 mg | ORAL_TABLET | Freq: Every day | ORAL | Status: DC
  Administered 2019-03-01 – 2019-03-02 (×2): 80 mg via ORAL
  Filled 2019-03-01 (×2): qty 1

## 2019-03-01 MED ORDER — ONDANSETRON HCL 4 MG/2ML IJ SOLN
INTRAMUSCULAR | Status: DC | PRN
  Administered 2019-03-01: 4 mg via INTRAVENOUS

## 2019-03-01 MED ORDER — LIDOCAINE HCL (PF) 1 % IJ SOLN
INTRAMUSCULAR | Status: DC | PRN
  Administered 2019-03-01: 10 mL
  Administered 2019-03-01: 10:00:00 10 mL via INTRADERMAL

## 2019-03-01 MED ORDER — SODIUM CHLORIDE 0.9% FLUSH
3.0000 mL | Freq: Two times a day (BID) | INTRAVENOUS | Status: DC
  Administered 2019-03-01 – 2019-03-02 (×3): 3 mL via INTRAVENOUS

## 2019-03-01 MED ORDER — MORPHINE SULFATE (PF) 2 MG/ML IV SOLN: 1.0000 mg | INTRAVENOUS | Status: DC | PRN

## 2019-03-01 MED ORDER — CHLORHEXIDINE GLUCONATE 4 % EX LIQD
60.0000 mL | Freq: Once | CUTANEOUS | Status: DC
  Filled 2019-03-01: qty 60

## 2019-03-01 MED ORDER — FENTANYL CITRATE (PF) 100 MCG/2ML IJ SOLN
INTRAMUSCULAR | Status: DC | PRN
  Administered 2019-03-01 (×2): 25 ug via INTRAVENOUS

## 2019-03-01 MED ORDER — ASPIRIN 81 MG PO TBEC: 81.0000 mg | DELAYED_RELEASE_TABLET | Freq: Every day | ORAL | Status: DC

## 2019-03-01 MED ORDER — CHLORHEXIDINE GLUCONATE 4 % EX LIQD: 30.0000 mL | CUTANEOUS | Status: DC

## 2019-03-01 MED ORDER — PHENYLEPHRINE HCL-NACL 10-0.9 MG/250ML-% IV SOLN
INTRAVENOUS | Status: DC | PRN
  Administered 2019-03-01: 30 ug/min via INTRAVENOUS

## 2019-03-01 MED ORDER — PROTAMINE SULFATE 10 MG/ML IV SOLN
INTRAVENOUS | Status: DC | PRN
  Administered 2019-03-01: 10 mg via INTRAVENOUS
  Administered 2019-03-01: 110 mg via INTRAVENOUS

## 2019-03-01 MED ORDER — ACETAMINOPHEN 650 MG RE SUPP: 650.0000 mg | Freq: Four times a day (QID) | RECTAL | Status: DC | PRN

## 2019-03-01 MED ORDER — HEPARIN SODIUM (PORCINE) 1000 UNIT/ML IJ SOLN
INTRAMUSCULAR | Status: DC | PRN
  Administered 2019-03-01: 12000 [IU] via INTRAVENOUS

## 2019-03-01 MED ORDER — ACETAMINOPHEN 325 MG PO TABS
650.0000 mg | ORAL_TABLET | Freq: Four times a day (QID) | ORAL | Status: DC | PRN
  Administered 2019-03-01 – 2019-03-02 (×2): 650 mg via ORAL
  Filled 2019-03-01 (×2): qty 2

## 2019-03-01 MED ORDER — SODIUM CHLORIDE 0.9 % IV SOLN
1.5000 g | Freq: Two times a day (BID) | INTRAVENOUS | Status: AC
  Administered 2019-03-01 – 2019-03-03 (×4): 1.5 g via INTRAVENOUS
  Filled 2019-03-01 (×5): qty 1.5

## 2019-03-01 MED ORDER — TRAMADOL HCL 50 MG PO TABS: 50.0000 mg | ORAL_TABLET | ORAL | Status: DC | PRN

## 2019-03-01 MED ORDER — ONDANSETRON HCL 4 MG/2ML IJ SOLN: 4.0000 mg | Freq: Four times a day (QID) | INTRAMUSCULAR | Status: DC | PRN

## 2019-03-01 MED ORDER — INSULIN ASPART 100 UNIT/ML ~~LOC~~ SOLN
0.0000 [IU] | Freq: Three times a day (TID) | SUBCUTANEOUS | Status: DC
  Administered 2019-03-01 – 2019-03-02 (×3): 4 [IU] via SUBCUTANEOUS
  Administered 2019-03-02: 2 [IU] via SUBCUTANEOUS

## 2019-03-01 MED ORDER — ASPIRIN 81 MG PO CHEW
81.0000 mg | CHEWABLE_TABLET | Freq: Every day | ORAL | Status: DC
  Administered 2019-03-02 – 2019-03-03 (×2): 81 mg via ORAL
  Filled 2019-03-01 (×2): qty 1

## 2019-03-01 MED ORDER — AMLODIPINE BESYLATE 5 MG PO TABS
5.0000 mg | ORAL_TABLET | Freq: Every day | ORAL | Status: DC
  Administered 2019-03-01 – 2019-03-03 (×3): 5 mg via ORAL
  Filled 2019-03-01 (×3): qty 1

## 2019-03-01 MED ORDER — SODIUM CHLORIDE 0.9% FLUSH: 3.0000 mL | INTRAVENOUS | Status: DC | PRN

## 2019-03-01 MED ORDER — LIDOCAINE HCL 1 % IJ SOLN
INTRAMUSCULAR | Status: AC
  Filled 2019-03-01: qty 20

## 2019-03-01 MED ORDER — VANCOMYCIN HCL IN DEXTROSE 1-5 GM/200ML-% IV SOLN
1000.0000 mg | Freq: Once | INTRAVENOUS | Status: AC
  Administered 2019-03-01: 1000 mg via INTRAVENOUS
  Filled 2019-03-01: qty 200

## 2019-03-01 MED ORDER — PROPOFOL 500 MG/50ML IV EMUL
INTRAVENOUS | Status: DC | PRN
  Administered 2019-03-01: 10 ug/kg/min via INTRAVENOUS

## 2019-03-01 MED ORDER — LACTATED RINGERS IV SOLN: INTRAVENOUS | Status: DC

## 2019-03-01 MED ORDER — PHENYLEPHRINE HCL-NACL 20-0.9 MG/250ML-% IV SOLN
0.0000 ug/min | INTRAVENOUS | Status: DC
  Filled 2019-03-01: qty 250

## 2019-03-01 MED ORDER — PAROXETINE HCL 30 MG PO TABS
30.0000 mg | ORAL_TABLET | Freq: Every day | ORAL | Status: DC
  Administered 2019-03-01 – 2019-03-03 (×3): 30 mg via ORAL
  Filled 2019-03-01 (×3): qty 1

## 2019-03-01 MED ORDER — OXYCODONE HCL 5 MG PO TABS: 5.0000 mg | ORAL_TABLET | ORAL | Status: DC | PRN

## 2019-03-01 MED ORDER — HYDRALAZINE HCL 20 MG/ML IJ SOLN
10.0000 mg | INTRAMUSCULAR | Status: DC | PRN
  Filled 2019-03-01: qty 1

## 2019-03-01 MED ORDER — HEPARIN (PORCINE) IN NACL 1000-0.9 UT/500ML-% IV SOLN
INTRAVENOUS | Status: DC | PRN
  Administered 2019-03-01 (×3): 500 mL

## 2019-03-01 MED ORDER — CLEVIDIPINE BUTYRATE 0.5 MG/ML IV EMUL
INTRAVENOUS | Status: DC | PRN
  Administered 2019-03-01: 4 mg/h via INTRAVENOUS

## 2019-03-01 MED ORDER — SODIUM CHLORIDE 0.9 % IV SOLN: INTRAVENOUS | Status: DC

## 2019-03-01 MED ORDER — CHLORHEXIDINE GLUCONATE 0.12 % MT SOLN
OROMUCOSAL | Status: AC
  Administered 2019-03-01: 15 mL via OROMUCOSAL
  Filled 2019-03-01: qty 15

## 2019-03-01 MED ORDER — CLOPIDOGREL BISULFATE 75 MG PO TABS
75.0000 mg | ORAL_TABLET | Freq: Every day | ORAL | Status: DC
  Administered 2019-03-02 – 2019-03-03 (×2): 75 mg via ORAL
  Filled 2019-03-01 (×2): qty 1

## 2019-03-01 MED ORDER — NITROGLYCERIN IN D5W 200-5 MCG/ML-% IV SOLN: 0.0000 ug/min | INTRAVENOUS | Status: DC

## 2019-03-01 MED ORDER — SODIUM CHLORIDE 0.9 % IV SOLN: 250.0000 mL | INTRAVENOUS | Status: DC | PRN

## 2019-03-01 MED ORDER — METOPROLOL TARTRATE 5 MG/5ML IV SOLN: 2.5000 mg | INTRAVENOUS | Status: DC | PRN

## 2019-03-01 MED ORDER — METOPROLOL SUCCINATE ER 25 MG PO TB24
25.0000 mg | ORAL_TABLET | Freq: Every day | ORAL | Status: DC
  Administered 2019-03-01 – 2019-03-03 (×3): 25 mg via ORAL
  Filled 2019-03-01 (×3): qty 1

## 2019-03-01 MED ORDER — HEPARIN (PORCINE) IN NACL 1000-0.9 UT/500ML-% IV SOLN
INTRAVENOUS | Status: AC
  Filled 2019-03-01: qty 1500

## 2019-03-01 MED ORDER — FUROSEMIDE 40 MG PO TABS
40.0000 mg | ORAL_TABLET | Freq: Every day | ORAL | Status: DC
  Administered 2019-03-01 – 2019-03-03 (×3): 40 mg via ORAL
  Filled 2019-03-01 (×3): qty 1

## 2019-03-01 SURGICAL SUPPLY — 37 items
BAG SNAP BAND KOVER 36X36 (MISCELLANEOUS) ×4 IMPLANT
BLANKET WARM UNDERBOD FULL ACC (MISCELLANEOUS) ×2 IMPLANT
CABLE ADAPT PACING TEMP 12FT (ADAPTER) ×2 IMPLANT
CATH DIAG 6FR PIGTAIL ANGLED (CATHETERS) ×2 IMPLANT
CATH INFINITI 5 FR STR PIGTAIL (CATHETERS) ×2 IMPLANT
CATH INFINITI 6F AL2 (CATHETERS) ×2 IMPLANT
CATH S G BIP PACING (CATHETERS) ×2 IMPLANT
DEVICE CLOSURE PERCLS PRGLD 6F (VASCULAR PRODUCTS) ×2 IMPLANT
DRYSEAL FLEXSHEATH 18FR 33CM (SHEATH) ×1
ELECT DEFIB PAD ADLT CADENCE (PAD) ×2 IMPLANT
GUIDEWIRE CNFDA BRKR CVD (WIRE) ×2 IMPLANT
KIT DILATOR VASC 18G NDL (KITS) ×2 IMPLANT
KIT HEART LEFT (KITS) ×2 IMPLANT
KIT MICROPUNCTURE NIT STIFF (SHEATH) ×2 IMPLANT
PACK CARDIAC CATHETERIZATION (CUSTOM PROCEDURE TRAY) ×2 IMPLANT
PERCLOSE PROGLIDE 6F (VASCULAR PRODUCTS) ×4
SHEATH BRITE TIP 7FR 35CM (SHEATH) ×2 IMPLANT
SHEATH DRYSEAL FLEX 18FR 33CM (SHEATH) ×1 IMPLANT
SHEATH PINNACLE 6F 10CM (SHEATH) ×2 IMPLANT
SHEATH PINNACLE 8F 10CM (SHEATH) ×2 IMPLANT
SHEATH PROBE COVER 6X72 (BAG) ×2 IMPLANT
SHIELD RADPAD SCOOP 12X17 (MISCELLANEOUS) ×2 IMPLANT
SLEEVE REPOSITIONING LENGTH 30 (MISCELLANEOUS) ×2 IMPLANT
STOPCOCK MORSE 400PSI 3WAY (MISCELLANEOUS) ×4 IMPLANT
SYS DEL EVOLUT PROPLS 23 26 29 (CATHETERS) ×2
SYS LOAD EVOLT PROPLS 23 26 29 (CATHETERS) ×2
SYSTEM DEL EVLT PRPLS 23 26 29 (CATHETERS) ×1 IMPLANT
SYSTEM LOAD EVLT PRPLS23 26 29 (CATHETERS) ×1 IMPLANT
TRANSDUCER W/STOPCOCK (MISCELLANEOUS) ×4 IMPLANT
TUBE CONN 8.8X1320 FR HP M-F (CONNECTOR) ×2 IMPLANT
TUBING ART PRESS 72  MALE/FEM (TUBING) ×1
TUBING ART PRESS 72 MALE/FEM (TUBING) ×1 IMPLANT
VALVE AORTIC EVOLT PROPLUS 23 (Valve) ×2 IMPLANT
WIRE AMPLATZ SS-J .035X180CM (WIRE) ×2 IMPLANT
WIRE EMERALD 3MM-J .035X150CM (WIRE) ×2 IMPLANT
WIRE EMERALD 3MM-J .035X260CM (WIRE) ×2 IMPLANT
WIRE EMERALD ST .035X260CM (WIRE) ×2 IMPLANT

## 2019-03-01 NOTE — Op Note (Signed)
[facility]   MULTIDISCIPLINARY HEART VALVE TEAM   TAVR OPERATIVE NOTE  HILDY NICHOLL [id number]  Date of Procedure:                 03/01/2019  Preoperative Diagnosis:      Severe Aortic Stenosis   Postoperative Diagnosis:    Same   Procedure:        Valve in Valve Transcatheter Aortic Valve Replacement - Percutaneous Right Transfemoral Approach             Medtronic Evolut-Pro+  (size 23 mm, model # EVPROPLUS-23US, serial # [id number])              Co-Surgeons:            Gaye Pollack, MD and Sherren Mocha, MD   Anesthesiologist:                  Nelda Severe. Tobias Alexander, MD  Echocardiographer:              Jenkins Rouge, MD  Pre-operative Echo Findings: ? Severe prosthetic aortic valve stenosis  ?  Normal left ventricular systolic function  Post-operative Echo Findings: ? trivial paravalvular leak ? Normal left ventricular systolic function ? Normal mean prosthetic transvalvular gradient of 4 mm Hg.   BRIEF CLINICAL NOTE AND INDICATIONS FOR SURGERY  This 74 year old woman has stage D, severe, symptomatic prosthetic aortic valve stenosis and was admitted with New York Heart Association class IV symptoms of exertional shortness of breath and fatigue as well as orthopnea and had a BNP of 2131 on admission consistent with acute on chronic diastolic congestive heart failure. He was also noted to be in complete heart block with marked bradycardia on admission. She improved with intravenous diuresis. She has had a permanent dual-chamber pacemaker placed and said that she feels much better. I have personally reviewed her 2D echocardiogram, cardiac catheterization, and CTA studies. Her echocardiogram shows a mean gradient across aortic valve of 55.5 mmHg which has significantly increased from her prior echocardiogram on 06/07/2017 when her mean gradient was 28 mmHg. She also has severe mitral annular calcification, a calcified degenerative mitral valve and  moderate to severe mitral valve stenosis with a mean gradient of 10 mmHg which is increased from her previous echocardiogram where her gradient was 5 mmHg. Left ventricular systolic function is normal. Cardiac catheterization shows severe single-vessel disease with occlusion of the mid right coronary artery and continued patency of the saphenous vein graft to the posterior descending artery. There is no other significant coronary disease. She is symptomatically much better following permanent pacemaker but I agree that aortic valve replacement is indicated in this patient with severe prosthetic aortic valve stenosis. She is definitely not a candidate for redo sternotomy and redo open surgical aortic valve replacement given her multiple comorbidities and Jehovah's Witness status. She has a 21 mm [facility] valve which will be suitable for valve in valve TAVR using a 23 mm Medtronic Evolut valve. Her gated cardiac CTA shows anatomy suitable for valve in valve TAVR. Her abdominal pelvic CTA shows adequate pelvic vascular anatomy to allow transfemoral insertion.   The patient was counseled at length regarding treatment alternatives for management of severe symptomatic prosthetic aortic valve stenosis. The risks and benefits of surgical intervention has been discussed in detail. Long-term prognosis with medical therapy was discussed. Alternative approaches such as conventional surgical aortic valve replacement, transcatheter aortic valve replacement, and palliative medical therapy were compared and contrasted  at length. This discussion was placed in the context of the patient's own specific clinical presentation and past medical history. All of her questions have been addressed.   Following the decision to proceed with transcatheter aortic valve replacement, a discussion was held regarding what types of management strategies would be attempted intraoperatively in the event of life-threatening complications,  including whether or not the patient would be considered a candidate for the use of cardiopulmonary bypass and/or conversion to open sternotomy for attempted surgical intervention. I do not think she would be a candidate for emergent redo sternotomy to manage any intraoperative complications. The patient has been advised of a variety of complications that might develop including but not limited to risks of death, stroke, paravalvular leak, aortic dissection or other major vascular complications, aortic annulus rupture, device embolization, cardiac rupture or perforation, mitral regurgitation, acute myocardial infarction, arrhythmia, heart block or bradycardia requiring permanent pacemaker placement, congestive heart failure, respiratory failure, renal failure, pneumonia, infection, other late complications related to structural valve deterioration or migration, or other complications that might ultimately cause a temporary or permanent loss of functional independence or other long term morbidity. The patient provides full informed consent for the procedure as described and all questions were answered.      DETAILS OF THE OPERATIVE PROCEDURE  PREPARATION:    The patient is brought to the operating room on the above mentioned date and central monitoring was established by the anesthesia team including placement of a central venous line and radial arterial line. The patient is placed in the supine position on the operating table.  Intravenous antibiotics are administered. The patient was monitored by anesthesia under conscious sedation.  Baseline tranthoracic echocardiogram was performed. The patient's abdomen and both groins are prepared and draped in a sterile manner. A time out procedure is performed.   PERIPHERAL ACCESS:    Using the modified Seldinger technique, femoral venous access was obtained with placement of a 6 Fr sheath on the left side.  A temporary transvenous pacemaker catheter  was passed through the left femoral venous sheath under fluoroscopic guidance into the right ventricle.  The pacemaker was tested to ensure stable lead placement and pacemaker capture.    TRANSFEMORAL ACCESS:   Percutaneous transfemoral access and sheath placement was performed using ultrasound guidance.  The right common femoral artery was cannulated using a micropuncture needle.  A pair of Abbott Perclose percutaneous closure devices were placed and a 6 French sheath replaced into the femoral artery.  The patient was heparinized systemically and ACT verified > 250 seconds.    A 18 Fr transfemoral [place] Dry-Seal sheath was introduced into the right femoral artery after progressively dilating over an Amplatz superstiff wire. An AL-1 catheter was used to direct a straight-tip exchange length wire across the prosthetic aortic valve into the left ventricle. This was exchanged out for a pigtail catheter and position was confirmed in the LV apex. Simultaneous LV and Ao pressures were recorded.  The pigtail catheter was exchanged for an Amplatz Super-stiff wire in the LV apex.    BALLOON AORTIC VALVULOPLASTY:   Not performed  TRANSCATHETER HEART VALVE DEPLOYMENT:   A Medtronic Evolut-Pro+ transcatheter heart valve (size 23 mm, model #EVPROPLUS-23US, serial # [id number]) was prepared and loaded into an EnVeo PRO delivery catheter system per manufacturer's guidelines and the proper orientation of the valve is confirmed under fluoroscopy. The delivery system was inserted into the right common femoral artery sheath over the Super-stiff wire advanced into the abdominal aorta  under fluoroscopic guidance. The delivery catheter was advanced around the aortic arch and the valve was carefully positioned across the aortic valve annulus. An aortic root injection was performed to confirm position and the valve deployed using the normal technique under fluoroscopic guidance. Intermittent pacing was used during valve  deployment. The delivery system and guidewire were retracted into the descending aorta and the nosecone re-sheathed. Valve function is assessed using echocardiography. There is felt to be trivial paravalvular leak and no central aortic insufficiency. The patient's hemodynamic recovery following valve deployment is good.      PROCEDURE COMPLETION:   The delivery system was removed and femoral artery closure performed using the previously placed Perclose devices. Protamine was administered once femoral arterial repair was complete. The temporary pacemaker and left femoral sheath were removed with manual pressure used for hemostasis.   The patient tolerated the procedure well and is transported to the cath lab recovery area in stable condition. There were no immediate intraoperative complications. All sponge instrument and needle counts are verified correct at completion of the operation.   No blood products were administered during the operation.  The patient received a total of 20 mL of intravenous contrast during the procedure.   Gaye Pollack, MD

## 2019-03-01 NOTE — Transfer of Care (Signed)
Immediate Anesthesia Transfer of Care Note  Patient: [name]  Procedure(s) Performed: TRANSCATHETER AORTIC VALVE REPLACEMENT, TRANSFEMORAL (N/A )  Patient Location: PACU and Cath Lab  Anesthesia Type:MAC  Level of Consciousness: drowsy  Airway & Oxygen Therapy: Patient Spontanous Breathing and Patient connected to nasal cannula oxygen  Post-op Assessment: Report given to RN and Post -op Vital signs reviewed and stable  Post vital signs: Reviewed and stable  Last Vitals:  Vitals Value Taken Time  BP 119/50 03/01/19 1118  Temp    Pulse 69 03/01/19 1124  Resp 14 03/01/19 1124  SpO2 100 % 03/01/19 1124  Vitals shown include unvalidated device data.  Last Pain: There were no vitals filed for this visit.       Complications: No apparent anesthesia complications

## 2019-03-01 NOTE — Anesthesia Procedure Notes (Signed)
Arterial Line Insertion Start/End1/06/2019 8:15 AM, 03/01/2019 8:20 AM Performed by: Duane Boston, MD, Griffin Dakin, CRNA, CRNA  Patient location: Pre-op. Preanesthetic checklist: patient identified, IV checked, site marked, risks and benefits discussed, surgical consent, monitors and equipment checked, pre-op evaluation, timeout performed and anesthesia consent Lidocaine 1% used for infiltration Right, radial was placed Catheter size: 20 Fr Hand hygiene performed  and maximum sterile barriers used   Attempts: 1 Procedure performed without using ultrasound guided technique. Following insertion, dressing applied. Post procedure assessment: normal and unchanged  Patient tolerated the procedure well with no immediate complications.

## 2019-03-01 NOTE — Interval H&P Note (Signed)
History and Physical Interval Note:  03/01/2019 9:54 AM  [name]  has presented today for surgery, with the diagnosis of aortic stenosis.  The various methods of treatment have been discussed with the patient and family. After consideration of risks, benefits and other options for treatment, the patient has consented to  Procedure(s): TRANSCATHETER AORTIC VALVE REPLACEMENT, TRANSFEMORAL (N/A) as a surgical intervention.  The patient's history has been reviewed, patient examined, no change in status, stable for surgery.  I have reviewed the patient's chart and labs.  Questions were answered to the patient's satisfaction.     Sherren Mocha

## 2019-03-01 NOTE — Progress Notes (Signed)
  Echocardiogram 2D Echocardiogram has been performed.  Matilde Bash 03/01/2019, 10:46 AM

## 2019-03-01 NOTE — Op Note (Signed)
[facility]   MULTIDISCIPLINARY HEART VALVE TEAM   TAVR OPERATIVE NOTE   Date of Procedure:  03/01/2019  Preoperative Diagnosis: Severe Aortic Stenosis   Postoperative Diagnosis: Same   Procedure:    Transcatheter Aortic Valve Replacement (Valve-in-Valve) - Percutaneous Transfemoral Approach  Medtronic Evolut Pro Plus (23 mm, serial # [id number])   Co-Surgeons:  Gaye Pollack, MD and Sherren Mocha, MD  Anesthesiologist:  Sheppard Evens, MD  Echocardiographer:  Jenkins Rouge, MD  Pre-operative Echo Findings:  Severe prostheticaortic stenosis  Normal left ventricular systolic function  Post-operative Echo Findings:  No paravalvular leak  Normal/unchanged left ventricular systolic function  BRIEF CLINICAL NOTE AND INDICATIONS FOR SURGERY  74 year old woman who has a history of bioprosthetic aortic valve replacement with a 21 mm Edwards magna valve.  She has developed severe symptomatic bioprosthetic aortic stenosis and presents today for TAVR.   During the course of the patient's preoperative work up they have been evaluated comprehensively by a multidisciplinary team of specialists coordinated through the [facility] in the [facility] [facility].  They have been demonstrated to suffer from symptomatic severe aortic stenosis as noted above. The patient has been counseled extensively as to the relative risks and benefits of all options for the treatment of severe aortic stenosis including long term medical therapy, conventional surgery for aortic valve replacement, and transcatheter aortic valve replacement.  The patient has been independently evaluated in formal cardiac surgical consultation by Dr Cyndia Bent, who deemed the patient appropriate for TAVR. Based upon review of all of the patient's preoperative diagnostic tests they are felt to be candidate for transcatheter aortic valve replacement using the transfemoral approach as  an alternative to high risk redo conventional surgery.    Following the decision to proceed with transcatheter aortic valve replacement, a discussion has been held regarding what types of management strategies would be attempted intraoperatively in the event of life-threatening complications, including whether or not the patient would be considered a candidate for the use of cardiopulmonary bypass and/or conversion to open sternotomy for attempted surgical intervention.  The patient has been advised of a variety of complications that might develop peculiar to this approach including but not limited to risks of death, stroke, paravalvular leak, aortic dissection or other major vascular complications, aortic annulus rupture, device embolization, cardiac rupture or perforation, acute myocardial infarction, arrhythmia, heart block or bradycardia requiring permanent pacemaker placement, congestive heart failure, respiratory failure, renal failure, pneumonia, infection, other late complications related to structural valve deterioration or migration, or other complications that might ultimately cause a temporary or permanent loss of functional independence or other long term morbidity.  The patient provides full informed consent for the procedure as described and all questions were answered preoperatively.  DETAILS OF THE OPERATIVE PROCEDURE  PREPARATION:   The patient is brought to the operating room on the above mentioned date and central monitoring was established by the anesthesia team including placement of a central venous catheter and radial arterial line. The patient is placed in the supine position on the operating table.  Intravenous antibiotics are administered. The patient is monitored closely throughout the procedure under conscious sedation.  Baseline transthoracic echocardiogram is performed. The patient's chest, abdomen, both groins, and both lower extremities are prepared and draped in a sterile  manner. A time out procedure is performed.   PERIPHERAL ACCESS:   Using ultrasound guidance, femoral venous access is obtained with placement of a 6 Fr sheath on the  left side.  A pigtail diagnostic catheter was passed through the femoral arterial sheath under fluoroscopic guidance into the aortic root.  A temporary transvenous pacemaker catheter was passed through the femoral venous sheath under fluoroscopic guidance into the right ventricle.  The pacemaker was tested to ensure stable lead placement and pacemaker capture.   TRANSFEMORAL ACCESS:  A micropuncture technique is used to access the right femoral artery under fluoroscopic and ultrasound guidance.  2 Perclose devices are deployed at 10' and 2' positions to 'PreClose' the femoral artery. An 8 French sheath is placed and then an Amplatz Superstiff wire is advanced through the sheath. This is changed out for an 23 [place] Dry-Seal sheath after progressively dilating over the Superstiff wire.  An AL-1 catheter was used to direct a straight-tip exchange length wire across the native aortic valve into the left ventricle. This was exchanged out for a pigtail catheter and position was confirmed in the LV apex. Simultaneous LV and Ao pressures were recorded.  The pigtail catheter was exchanged for an Amplatz Extra-stiff wire in the LV apex.    BALLOON AORTIC VALVULOPLASTY:  Not performed  TRANSCATHETER HEART VALVE DEPLOYMENT:  A Medtronic Evolut Pro Plus 23 mm transcatheter heart valve (size 23 mm) was prepared and crimped per manufacturer's guidelines, and the proper orientation of the valve is confirmed on the delivery system. The valve was advanced through the introducer sheath using normal technique. The valve is advanced around the aortic arch into the bioprosthetic valve with caution taken to maintain proper wire position. Controlled pacing is initiated and the valve is deployed using normal technique. When the valve is 80% deployed, the patient  recovery is good and echo is performed to assess valve hemodynamics. The valve is then fully released. The patient's hemodynamic recovery following valve deployment is good.  The delivery catheter is then removed without complication.  Echo demostrated acceptable post-procedural gradients, stable mitral valve function, and no aortic insufficiency.   PROCEDURE COMPLETION:  The sheath was removed and femoral artery closure is performed using the 2 previously deployed Perclose devices.  Protamine is administered once femoral arterial repair was complete. The site is clear with no evidence of bleeding or hematoma after the sutures are tightened. The temporary pacemaker is removed.  The patient tolerated the procedure well and is transported to the surgical intensive care in stable condition. There were no immediate intraoperative complications. All sponge instrument and needle counts are verified correct at completion of the operation.   The patient received a total of 0 mL of intravenous contrast during the procedure.   Sherren Mocha, MD 03/01/2019 4:21 PM

## 2019-03-02 ENCOUNTER — Inpatient Hospital Stay (HOSPITAL_COMMUNITY): Payer: Medicare Other

## 2019-03-02 DIAGNOSIS — Z954 Presence of other heart-valve replacement: Secondary | ICD-10-CM

## 2019-03-02 DIAGNOSIS — Z952 Presence of prosthetic heart valve: Secondary | ICD-10-CM

## 2019-03-02 DIAGNOSIS — I350 Nonrheumatic aortic (valve) stenosis: Secondary | ICD-10-CM

## 2019-03-02 LAB — CBC
HCT: 33.6 % — ABNORMAL LOW (ref 36.0–46.0)
Hemoglobin: 10.9 g/dL — ABNORMAL LOW (ref 12.0–15.0)
MCH: 30.4 pg (ref 26.0–34.0)
MCHC: 32.4 g/dL (ref 30.0–36.0)
MCV: 93.6 fL (ref 80.0–100.0)
Platelets: 222 10*3/uL (ref 150–400)
RBC: 3.59 MIL/uL — ABNORMAL LOW (ref 3.87–5.11)
RDW: 13.3 % (ref 11.5–15.5)
WBC: 9.8 10*3/uL (ref 4.0–10.5)
nRBC: 0 % (ref 0.0–0.2)

## 2019-03-02 LAB — BASIC METABOLIC PANEL
Anion gap: 9 (ref 5–15)
BUN: 24 mg/dL — ABNORMAL HIGH (ref 8–23)
CO2: 26 mmol/L (ref 22–32)
Calcium: 8.8 mg/dL — ABNORMAL LOW (ref 8.9–10.3)
Chloride: 105 mmol/L (ref 98–111)
Creatinine, Ser: 1.47 mg/dL — ABNORMAL HIGH (ref 0.44–1.00)
GFR calc Af Amer: 40 mL/min — ABNORMAL LOW (ref >60)
GFR calc non Af Amer: 35 mL/min — ABNORMAL LOW (ref >60)
Glucose, Bld: 115 mg/dL — ABNORMAL HIGH (ref 70–99)
Potassium: 3.7 mmol/L (ref 3.5–5.1)
Sodium: 140 mmol/L (ref 135–145)

## 2019-03-02 LAB — MAGNESIUM: Magnesium: 1.7 mg/dL (ref 1.7–2.4)

## 2019-03-02 LAB — SARS CORONAVIRUS 2 (TAT 6-24 HRS): SARS Coronavirus 2: NEGATIVE

## 2019-03-02 LAB — GLUCOSE, CAPILLARY
Glucose-Capillary: 100 mg/dL — ABNORMAL HIGH (ref 70–99)
Glucose-Capillary: 168 mg/dL — ABNORMAL HIGH (ref 70–99)
Glucose-Capillary: 122 mg/dL — ABNORMAL HIGH (ref 70–99)
Glucose-Capillary: 184 mg/dL — ABNORMAL HIGH (ref 70–99)

## 2019-03-02 MED FILL — Lidocaine HCl Local Inj 1%: INTRAMUSCULAR | Qty: 20 | Status: AC

## 2019-03-02 NOTE — Progress Notes (Signed)
  Echocardiogram 2D Echocardiogram has been performed.  [name] 03/02/2019, 10:52 AM

## 2019-03-02 NOTE — Discharge Summary (Signed)
Physician Discharge Summary  Patient ID: [name] MRN: [id number] DOB/AGE: 1946/02/09 74 y.o.  Admit date: 03/01/2019 Discharge date: 03/03/2019  Admission Diagnoses:  Patient Active Problem List   Diagnosis Date Noted  . Complete heart block ([place])   . Acute on chronic diastolic (congestive) heart failure ([place]) 02/07/2019  . Acute on chronic diastolic CHF (congestive heart failure) ([place]) 06/22/2017  . Peripheral arterial disease ([place]) 06/18/2017  . B12 deficiency 05/01/2017  . Edema of left lower extremity 11/08/2015  . Aortic atherosclerosis ([place]) 06/07/2014  . Abnormality of gait 05/29/2012  . Constipation 03/16/2012  . HOCM (hypertrophic obstructive cardiomyopathy) ([place]) 06/11/2011  . Acute on chronic renal failure ([place]) 05/25/2011  . Routine health maintenance 06/10/2010  . Hyperlipidemia   . Refusal of blood transfusions as patient is Jehovah's Witness   . History of adenocarcinoma of breast   . Osteoporosis   . Status post CVA   . Aortic stenosis s/p Tissure AVR 2006   . CAD (coronary artery disease)   . Hypertension   . Depression 01/23/2006  . DM (diabetes mellitus) ([place]) 03/25/1990  . CKD (chronic kidney disease), stage III 1992  . Hypokalemia 1992  . Bradycardia 1992   Discharge Diagnoses:   Patient Active Problem List   Diagnosis Date Noted  . S/P TAVR (transcatheter aortic valve replacement) 03/01/2019  . Complete heart block ([place])   . Acute on chronic diastolic (congestive) heart failure ([place]) 02/07/2019  . Acute on chronic diastolic CHF (congestive heart failure) ([place]) 06/22/2017  . Peripheral arterial disease ([place]) 06/18/2017  . B12 deficiency 05/01/2017  . Edema of left lower extremity 11/08/2015  . Aortic atherosclerosis ([place]) 06/07/2014  . Abnormality of gait 05/29/2012  . Constipation 03/16/2012  . HOCM (hypertrophic obstructive cardiomyopathy) ([place]) 06/11/2011  . Acute on chronic renal failure ([place]) 05/25/2011  . Routine health maintenance  06/10/2010  . Hyperlipidemia   . Refusal of blood transfusions as patient is Jehovah's Witness   . History of adenocarcinoma of breast   . Osteoporosis   . Status post CVA   . Aortic stenosis s/p Tissure AVR 2006   . CAD (coronary artery disease)   . Hypertension   . Depression 01/23/2006  . DM (diabetes mellitus) ([place]) 03/25/1990  . CKD (chronic kidney disease), stage III 1992  . Hypokalemia 1992  . Bradycardia 1992   Discharged Condition: good  History of Present Illness:   [name] is a 74 yo AA female with known history of CAD and Aortic Stenosis.  She underwent CABG x 1 and replacement of her Aortic Valve by Dr. Cyndia Bent in 2006.  She also has a history of post operative CVA, CKD stage III, IDDM, anemia, chronic diastolic HF, breast cancer S/P mastectomy, chemotherapy, radiation, HTN, HLD, and Jehovah's witness.  The patient has been followed intermittently since her CABG/AVR.  Her aortic valve was functioning normally on her Echocardiogram in 01/2016.  However, in April of 2019 the patient was admitted for Hypertensive urgency.  Echocardiogram at that time showed a preserved EF with moderate aortic stenosis.  Since that time the patient has been admitted twice for CHF exacerbation, and in May of this year with acute hypoxic respiratory failure due to COVID-19.  The patient improved with supportive care.  She had been in her usual state of health until December at which time she developed worsening shortness of breath on exertion and was unable to lie flat due to orthopnea.  She developed lower extremity edema as well with abdominal  fullness, with early satiety after eating.  She presented to the ED where workup showed an elevated BNP of greater than 2000.  EKG showed high grade AV block with a HR of 48 bmp.  She was treated with IV Lasix with improvement in her symptoms.  Her heart rate did not improve and she ultimately required placement of a PPM.  Echocardiogram at that time showed  severe bioprosthetic aortic stenosis.  It was felt she would require intervention on her AVR.  She was evaluated for a Transcatheter Aortic Valve Replacement.  She was felt to be a candidate.  The risks and benefits of the procedure were explained to the patient and she was agreeable to proceed.  She was deconditioned during that admission and it was felt she should be discharged to a SNF facility to get a little stronger prior to proceeding with TAVR procedure.  Hospital Course:   [name] presented to [facility].  She was taken to the operating room and underwent Valve in valve Transcatheter Aortic Valve via Percutaneous Transfemoral approach.  This was done using a Medtronic Evolut Pro Plus 23 mm valve.  She tolerated the procedure without difficulty.  She was monitored in the post recovery room and then transferred to the progressive care unit.  The patient has done well overnight.  Her creatinine has remained within her baseline level.  She remains deconditioned requiring max assist to get up and go to the bathroom.  Her groin site is clean and dry without evidence of hematoma.  Echocardiogram obtained showed no evidence of leak.  Estimated gradient was 9.  Due to her deconditioning she will require additional therapy at SNF facility where she was prior to surgery.  COVID test was performed and was negative.  She is medically stable for discharge to SNF today.  Significant Diagnostic Studies: cardiac graphics:   Echocardiogram:   Pre:   1. Left ventricular ejection fraction, by visual estimation, is 70 to 75%. The left ventricle has hyperdynamic function. There is moderately increased left ventricular hypertrophy.  2. Abnormal septal motion consistent with post-operative status.  3. Elevated left atrial pressure.  4. The left ventricle has no regional wall motion abnormalities.  5. Global right ventricle has normal systolic function.The right ventricular size is normal. No increase  in right ventricular wall thickness.  6. Left atrial size was severely dilated.  7. Right atrial size was mildly dilated.  8. Severe mitral annular calcification.  9. The mitral valve is degenerative. Mild mitral valve regurgitation. Moderate-severe mitral stenosis. 10. MV mean gradient, 10 mmHg at 45 bpm. Calculated area is 1.4 cm sq by PHT and 1.1 cm sq by continuity equation. 11. Severe aortic valve bioprosthesis stenosis. Mean gradient is 55 mm Hg, dimensionless obstructive index 0.24. 12. Aortic valve regurgitation is mild. 13. The tricuspid valve is normal in structure. Tricuspid valve regurgitation is mild. 14. Pulmonic regurgitation is mild. 15. The pulmonic valve was grossly normal. Pulmonic valve regurgitation is mild. 16. Severely elevated pulmonary artery systolic pressure. 17. The tricuspid regurgitant velocity is 3.97 m/s, and with an assumed right atrial pressure of 15 mmHg, the estimated right ventricular systolic pressure is severely elevated at 78.0 mmHg. 18. The inferior vena cava is dilated in size with <50% respiratory variability, suggesting right atrial pressure of 15 mmHg.  FINDINGS  Left Ventricle: Left ventricular ejection fraction, by visual estimation, is 70 to 75%. The left ventricle has hyperdynamic function. The left ventricle has no regional wall motion abnormalities.  The left ventricular internal cavity size was the left  ventricle is normal in size. There is moderately increased left ventricular hypertrophy. Concentric left ventricular hypertrophy. Abnormal (paradoxical) septal motion consistent with post-operative status. The left ventricular diastology could not be  evaluated due to mitral stenosis. Elevated left atrial pressure.  Post:  Treatments: surgery:    Valve in Valve Transcatheter Aortic Valve Replacement - Percutaneous RightTransfemoral Approach Medtronic Evolut-Pro+ (size 45mm, model # EVPROPLUS-23US, serial #  [id number])  Discharge Exam: Blood pressure (!) 135/59, pulse 70, temperature 98.1 F (36.7 C), temperature source Oral, resp. rate 14, height $RemoveBe'5\' 6"'cYtNyTmbv$  (1.676 m), weight 74.5 kg, SpO2 99 %.   General appearance: alert, cooperative and no distress Heart: regular rate and rhythm and + systolic murmur Lungs: clear to auscultation bilaterally Abdomen: soft, non-tender; bowel sounds normal; no masses,  no organomegaly Extremities: extremities normal, atraumatic, no cyanosis or edema Wound: clean and dry, no evidence of hematoma  Discharge disposition: 03-Skilled Nursing Facility  Discharge Medications:  Allergies as of 03/03/2019      Reactions   Other Other (See Comments)   NO "blood products," as the patient is a Jehovah's Witness      Medication List    TAKE these medications   Accu-Chek FastClix Lancets Misc Use to check blood sugar 4 times a day. diag code E11.22. insulin dependent   acetaminophen 500 MG tablet Commonly known as: TYLENOL Take 2 tablets (1,000 mg total) by mouth every 6 (six) hours as needed.   amLODipine 5 MG tablet Commonly known as: NORVASC TAKE 1 TABLET BY MOUTH EVERY DAY   aspirin 81 MG EC tablet TAKE 1 TABLET (81 MG TOTAL) BY MOUTH DAILY. SWALLOW WHOLE. What changed:   how much to take  how to take this  when to take this  additional instructions   atorvastatin 80 MG tablet Commonly known as: LIPITOR Take 1 tablet (80 mg total) by mouth at bedtime.   clopidogrel 75 MG tablet Commonly known as: PLAVIX Take 1 tablet (75 mg total) by mouth daily with breakfast.   Contour Next Test test strip Generic drug: glucose blood Use to check blood sugar 4 times daily. diag code E11.22. insulin dependent   furosemide 40 MG tablet Commonly known as: LASIX Take 1 tablet (40 mg total) by mouth daily.   metoprolol succinate 25 MG 24 hr tablet Commonly known as: TOPROL-XL Take 1 tablet (25 mg total) by mouth daily.   PARoxetine 30 MG tablet Commonly  known as: PAXIL TAKE 1 TABLET BY MOUTH DAILY   Pen Needles 3/16" 31G X 5 MM Misc 1 each by Does not apply route 4 (four) times daily. Use to check blood sugars 4 times a day. Dx code E11.22   polyethylene glycol 17 g packet Commonly known as: MIRALAX / GLYCOLAX Take 17 g by mouth daily. What changed:   when to take this  reasons to take this   Xultophy 100-3.6 UNIT-MG/ML Sopn Generic drug: Insulin Degludec-Liraglutide Inject 10 Units into the skin daily.      Follow-up Information    Eileen Stanford, PA-C Follow up on 03/09/2019.   Specialties: Cardiology, Radiology Why: Virtual Visit at 1:00 Contact information: [place] [facility] [id number] [phone]           Signed:  Ellwood Handler, PA-C  03/03/2019, 7:55 AM

## 2019-03-02 NOTE — TOC Initial Note (Signed)
Transition of Care [facility]) - Initial/Assessment Note    Patient Details  Name: [name] MRN: [id number] Date of Birth: [birth year]  [facility]) CM/SW Contact:    Vinie Sill, [place] Phone Number: 03/02/2019, 12:47 PM  Clinical Narrative:                  CSW met with patient at bedside. CSW introduced self and explaied role. Patient confirmed she was at [facility] for [facility] Rehab and was agreeable to returning there to complete rehab. Patent states no questions or concerns at this time.  [facility] confirmed bed offer and availability. Placement is pending covid test results.  Thurmond Butts, MSW, [place] Clinical Social Worker   Expected Discharge Plan: Skilled Nursing Facility Barriers to Discharge: SNF Pending discharge orders, SNF Pending discharge summary(covid test)   Patient Goals and CMS Choice        Expected Discharge Plan and Services Expected Discharge Plan: [place] In-house Referral: Clinical Social Work     Living arrangements for the past 2 months: [place]                                      Prior Living Arrangements/Services Living arrangements for the past 2 months: [place] Lives with:: Facility Resident Patient language and need for interpreter reviewed:: No        Need for Family Participation in Patient Care: Yes (Comment)     Criminal Activity/Legal Involvement Pertinent to Current Situation/Hospitalization: No - Comment as needed  Activities of Daily Living      Permission Sought/Granted Permission sought to share information with : Family Supports Permission granted to share information with : Yes, Verbal Permission Granted  Share Information with NAME: [name]  Permission granted to share info w AGENCY: [place] granted to share info w Relationship: daughter     Emotional Assessment Appearance:: Appears stated age Attitude/Demeanor/Rapport:  Engaged Affect (typically observed): Accepting Orientation: : Oriented to Self, Oriented to Place, Oriented to  Time, Oriented to Situation Alcohol / Substance Use: Not Applicable Psych Involvement: No (comment)  Admission diagnosis:  S/P TAVR (transcatheter aortic valve replacement) [Z95.2] Patient Active Problem List   Diagnosis Date Noted  . S/P TAVR (transcatheter aortic valve replacement) 03/01/2019  . Complete heart block ([place])   . Acute on chronic diastolic (congestive) heart failure ([place]) 02/07/2019  . Acute on chronic diastolic CHF (congestive heart failure) ([place]) 06/22/2017  . Peripheral arterial disease ([place]) 06/18/2017  . B12 deficiency 05/01/2017  . Edema of left lower extremity 11/08/2015  . Aortic atherosclerosis ([place]) 06/07/2014  . Abnormality of gait 05/29/2012  . Constipation 03/16/2012  . HOCM (hypertrophic obstructive cardiomyopathy) ([place]) 06/11/2011  . Acute on chronic renal failure ([place]) 05/25/2011  . Routine health maintenance 06/10/2010  . Hyperlipidemia   . Refusal of blood transfusions as patient is Jehovah's Witness   . History of adenocarcinoma of breast   . Osteoporosis   . Status post CVA   . Aortic stenosis s/p Tissure AVR 2006   . CAD (coronary artery disease)   . Hypertension   . Depression 01/23/2006  . DM (diabetes mellitus) ([place]) 03/25/1990  . CKD (chronic kidney disease), stage III 1992  . Hypokalemia 1992  . Bradycardia 1992   PCP:  [name], MD Pharmacy:   [facility], [place]  [address] [place] [address] Phone: [phone] Fax: [phone]  [id number] [address] [place] [address]Phone: [phone]: [phone]    Social Determinants of Health (SDOH) Interventions    Readmission Risk Interventions No flowsheet data found.

## 2019-03-02 NOTE — Progress Notes (Signed)
1110-1130 Gave pt diabetic diet. Pt stated she has her meals selected for her at [facility]. Encouraged her to keep groin area dry. Pt stated she has been walking about distance of across room with walker at Rehab. Noted pt was weak and took maximum asst to get to [facility] by nursing earlier. Pt more appropriate for PT services at this time. We will sign off. Not appropriate for Outpatient CRP due to mobility issues. Graylon Good RN BSN 03/02/2019 11:34 AM

## 2019-03-02 NOTE — Discharge Instructions (Signed)
TAVR D/C Instructions  ACTIVITY AND EXERCISE . Daily activity and exercise are an important part of your recovery. People recover at different rates depending on their general health and type of valve procedure. . Most people require six to 10 weeks to feel recovered. . No lifting, pushing, pulling more than 10 pounds (examples to avoid: groceries, vacuuming, gardening, golfing): - For one week with a procedure through the groin. - For six weeks for procedures through the chest wall. - For three months for procedures through the breast-bone. . After the initial healing process of the access site, we recommend cardiac rehabilitation for all TAVR patients. Cardiac rehabilitation will help you: - Rebuild stamina, strength and balance. - Learn how to participate in activities safely, as well as help you regain confidence to do so. - Return to activities of daily living and leisure. . Discuss attending cardiac rehabilitation at your follow-up appointment   DRIVING . Do not drive for four weeks after the date of your procedure. . If you have been told by your doctor in the past that you may not drive, you must talk with him/her before you begin driving again. . When you resume driving, you must have someone with you.  HYGIENE If you had a femoral (leg) procedure, you may take a shower when you return home. After the shower, pat the site dry. Do NOT use powder, oils or lotions in your groin area until the site has completely healed. . If you had a chest procedure, you may shower when you return home unless specifically instructed not to by your discharging practitioner. - DO NOT scrub incision; pat dry with a towel - DO NOT apply any lotions, oils, powders to the incision - No tub baths / swimming for at least six weeks.  SITE CARE . You likely will have small openings in both groins from catheters used during the procedure. If you had a transfemoral procedure, one groin will  have a larger opening and may be bruised or tender. . If you had a chest procedure, you will have either a small incision in your upper sternum (breast-bone) or between your ribs on your left side. - Chest wall site: The surgical incision should be kept dry (no lotions / oils / powders) and open to air. If you experience irritation from clothing rubbing on the incision, a light gauze dressing may be applied. - Inspect your incision daily; notify your physician if there is increased redness, swelling or drainage from the incision. - If the incision is located on your breast-bone you must avoid lifting objects heavier than a gallon of milk (eight pounds) and stretching / twisting / pulling with your arms for at least three months to ensure strong bone healing.   CONTACT [phone]- . Check your sites daily. Contact our office if you have any of the following problems: - Redness and warmth that does not go away - Yellow or green drainage from the wound - Fever and chills - Increasing numbness in your legs - Worsening pain at the site . If you had a leg/groin procedure, it is normal to have bruising or a soft lump at the site. It is not normal if the lump suddenly becomes larger or more firm. This may mean you are bleeding. If this happens: - Lie down - Have someone press down hard, just above the hole in your skin where the procedure was performed for 15 minutes. If after holding on the site, the lump does not become  larger or harder, they are performing this correctly. - If the bleeding has stopped after 15 minutes, rest and stay laying down for at least two hours. - If the bleeding continues, call 911 for an ambulance. Do NOT drive yourself or have someone else drive you.

## 2019-03-02 NOTE — Progress Notes (Addendum)
      [address]       [place] [address]             [phone]      1 Day Post-Op Procedure(s) (LRB): TRANSCATHETER AORTIC VALVE REPLACEMENT, TRANSFEMORAL (N/A) Subjective:  Patient up on bedside commode.  Has no specific complaints.  She states she is feeling pretty good.  She required max assist via nursing to get up on bedside commode.    Objective: Vital signs in last 24 hours: Temp:  [96 F (35.6 C)-98.2 F (36.8 C)] 98.2 F (36.8 C) (01/06 0520) Pulse Rate:  [0-159] 62 (01/06 0520) Cardiac Rhythm: Ventricular paced (01/06 0520) Resp:  [10-40] 17 (01/06 0520) BP: (99-170)/(50-78) 128/53 (01/06 0520) SpO2:  [0 %-100 %] 98 % (01/06 0520) Weight:  [74 kg] 74 kg (01/06 0520)  Intake/Output from previous day: 01/05 0701 - 01/06 0700 In: 2380 [P.O.:360; I.V.:1500; IV Piggyback:520] Out: 680 [Urine:650; Blood:30]  General appearance: alert, cooperative and no distress Heart: regular rate and rhythm and + systolic murmur Lungs: clear to auscultation bilaterally Abdomen: soft, non-tender; bowel sounds normal; no masses,  no organomegaly Extremities: extremities normal, atraumatic, no cyanosis or edema Wound: clean and dry, no evidence of hematoma  Lab Results: Recent Labs    03/01/19 1133 03/02/19 0253  WBC  --  9.8  HGB 10.9* 10.9*  HCT 32.0* 33.6*  PLT  --  222   BMET:  Recent Labs    03/01/19 1133 03/02/19 0253  NA 141 140  K 3.9 3.7  CL 104 105  CO2  --  26  GLUCOSE 145* 115*  BUN 20 24*  CREATININE 1.10* 1.47*  CALCIUM  --  8.8*    PT/INR: No results for input(s): LABPROT, INR in the last 72 hours. ABG    Component Value Date/Time   PHART 7.388 02/24/2019 1020   HCO3 26.4 02/24/2019 1020   TCO2 27 03/01/2019 1133   O2SAT 96.5 02/24/2019 1020   CBG (last 3)  Recent Labs    03/01/19 1644 03/01/19 2123 03/02/19 0617  GLUCAP 75 161* 100*    Assessment/Plan: S/P Procedure(s) (LRB): TRANSCATHETER AORTIC VALVE  REPLACEMENT, TRANSFEMORAL (N/A)  1. CV- H/O CHB, S/P PPM placement 02/09/2019, remains hemodynamically stable 2. Pulm- no acute issues, continue IS 3. Deconditioning- patient came from SNF facility, being she was max assist to get on bedside commode, she will require further PT/OT at SNF facility, COVID test has been ordered 4. dispo- patient stable, for 2D echo today, covid test sent, for d/c later today if echo is okay and SNF bed is available   LOS: 1 day    Ellwood Handler, PA-C  03/02/2019   Chart reviewed, patient examined, agree with above. Echo report is pending but looks ok to me and mean gradient 9 mm Hg.

## 2019-03-03 ENCOUNTER — Encounter: Payer: Medicare Other | Admitting: Internal Medicine

## 2019-03-03 DIAGNOSIS — Z954 Presence of other heart-valve replacement: Secondary | ICD-10-CM

## 2019-03-03 DIAGNOSIS — I350 Nonrheumatic aortic (valve) stenosis: Secondary | ICD-10-CM

## 2019-03-03 LAB — GLUCOSE, CAPILLARY: Glucose-Capillary: 107 mg/dL — ABNORMAL HIGH (ref 70–99)

## 2019-03-03 MED ORDER — CLOPIDOGREL BISULFATE 75 MG PO TABS: 75.0000 mg | ORAL_TABLET | Freq: Every day | ORAL | 3 refills | Status: DC

## 2019-03-03 NOTE — TOC Transition Note (Signed)
Transition of Care [facility]) - CM/SW Discharge Note   Patient Details  Name: [name] MRN: [id number] Date of Birth: [birth year]  [facility] CM/SW Contact:  Vinie Sill, [place] Phone Number: 03/03/2019, 10:33 AM   Clinical Narrative:     Patient will DC to: [facility] Date: 03/03/2019 Family Notified: [name], left voice message with daughter- patient is alert and oriented  Transport SW:VTVN  RN, patient, and facility notified of DC. Discharge Summary sent to facility. RN given number for [phone], Room 116. Ambulance transport requested for patient.   Clinical Social Worker signing off. Thurmond Butts, MSW, [place] Clinical Social Worker    Final next level of care: Skilled Nursing Facility Barriers to Discharge: Barriers Resolved   Patient Goals and CMS Choice        Discharge Placement              Patient chooses bed at: [facility] Patient to be transferred to facility by: [facility] [place] Name of family member notified: [name], left voice message  for daugher- patient is alert and oriented Patient and family notified of of transfer: 03/03/19  Discharge Plan and Services In-house Referral: Clinical Social Work                                   Social Determinants of Health (SDOH) Interventions     Readmission Risk Interventions No flowsheet data found.

## 2019-03-03 NOTE — Progress Notes (Addendum)
      [address]       [place] [address]             [phone]      2 Days Post-Op Procedure(s) (LRB): TRANSCATHETER AORTIC VALVE REPLACEMENT, TRANSFEMORAL (N/A)   Subjective:  Up in bed, no complaints.  Denies chest pain, shortness of breath.    Objective: Vital signs in last 24 hours: Temp:  [98.1 F (36.7 C)-98.6 F (37 C)] 98.1 F (36.7 C) (01/07 0358) Pulse Rate:  [68-71] 70 (01/06 1928) Cardiac Rhythm: Ventricular paced;Bundle branch block (01/06 2022) Resp:  [14-19] 14 (01/06 1928) BP: (123-146)/(56-89) 135/59 (01/07 0358) SpO2:  [97 %-99 %] 99 % (01/06 1928) Weight:  [74.5 kg] 74.5 kg (01/07 0358)  Intake/Output from previous day: 01/06 0701 - 01/07 0700 In: 530 [P.O.:430; IV Piggyback:100] Out: 1800 [Urine:1800]  General appearance: alert, cooperative and no distress Heart: regular rate and rhythm Lungs: clear to auscultation bilaterally Abdomen: soft, non-tender; bowel sounds normal; no masses,  no organomegaly Extremities: extremities normal, atraumatic, no cyanosis or edema Wound: bilateral groin sticks without evidence of hematoma  Lab Results: Recent Labs    03/01/19 1133 03/02/19 0253  WBC  --  9.8  HGB 10.9* 10.9*  HCT 32.0* 33.6*  PLT  --  222   BMET:  Recent Labs    03/01/19 1133 03/02/19 0253  NA 141 140  K 3.9 3.7  CL 104 105  CO2  --  26  GLUCOSE 145* 115*  BUN 20 24*  CREATININE 1.10* 1.47*  CALCIUM  --  8.8*    PT/INR: No results for input(s): LABPROT, INR in the last 72 hours. ABG    Component Value Date/Time   PHART 7.388 02/24/2019 1020   HCO3 26.4 02/24/2019 1020   TCO2 27 03/01/2019 1133   O2SAT 96.5 02/24/2019 1020   CBG (last 3)  Recent Labs    03/02/19 1619 03/02/19 2127 03/03/19 0611  GLUCAP 122* 184* 107*    Assessment/Plan: S/P Procedure(s) (LRB): TRANSCATHETER AORTIC VALVE REPLACEMENT, TRANSFEMORAL (N/A)  1. CV- H/O CHB, PPM in place- 2. Deconditioning- SNF bed has been  arranged, COVID test is negative 3. Dispo- patient stable, will d/c home today   LOS: 2 days    Ellwood Handler, PA-C  03/03/2019  Agree with above. She has been hemodynamically stable in AV paced rhythm. 2D echo from yesterday not read yet but looked ok to me. Plan discharge to SNF today.

## 2019-03-03 NOTE — Anesthesia Postprocedure Evaluation (Signed)
Anesthesia Post Note  Patient: [name]  Procedure(s) Performed: TRANSCATHETER AORTIC VALVE REPLACEMENT, TRANSFEMORAL (N/A )     Patient location during evaluation: PACU Anesthesia Type: MAC Level of consciousness: awake and alert Pain management: pain level controlled Vital Signs Assessment: post-procedure vital signs reviewed and stable Respiratory status: spontaneous breathing and respiratory function stable Cardiovascular status: stable Postop Assessment: no apparent nausea or vomiting Anesthetic complications: no    Last Vitals:  Vitals:   03/03/19 0848 03/03/19 1107  BP: (!) 157/85 (!) 167/80  Pulse: 73 71  Resp: 20 20  Temp: 36.7 C 37.1 C  SpO2: 99% 97%    Last Pain:  Vitals:   03/03/19 1107  TempSrc: Oral  PainSc:                  [name]

## 2019-03-03 NOTE — Progress Notes (Signed)
Pt discharged to [facility] via [place]. IV and telemetry box removed. Paperwork sent via PTAR. Pt left with all of her belongings.

## 2019-03-04 LAB — ECHOCARDIOGRAM COMPLETE
Height: 66 in
Weight: 2610.25 oz

## 2019-03-09 ENCOUNTER — Other Ambulatory Visit: Payer: Self-pay

## 2019-03-09 ENCOUNTER — Telehealth: Payer: Medicare Other | Admitting: Physician Assistant

## 2019-03-09 ENCOUNTER — Telehealth (INDEPENDENT_AMBULATORY_CARE_PROVIDER_SITE_OTHER): Payer: Medicare Other | Admitting: Physician Assistant

## 2019-03-09 VITALS — Temp 97.7°F

## 2019-03-09 DIAGNOSIS — I5032 Chronic diastolic (congestive) heart failure: Secondary | ICD-10-CM

## 2019-03-09 DIAGNOSIS — N189 Chronic kidney disease, unspecified: Secondary | ICD-10-CM

## 2019-03-09 DIAGNOSIS — I050 Rheumatic mitral stenosis: Secondary | ICD-10-CM

## 2019-03-09 DIAGNOSIS — Z950 Presence of cardiac pacemaker: Secondary | ICD-10-CM

## 2019-03-09 DIAGNOSIS — Z952 Presence of prosthetic heart valve: Secondary | ICD-10-CM

## 2019-03-21 ENCOUNTER — Other Ambulatory Visit: Payer: Self-pay | Admitting: Internal Medicine

## 2019-03-21 DIAGNOSIS — I2510 Atherosclerotic heart disease of native coronary artery without angina pectoris: Secondary | ICD-10-CM

## 2019-03-21 NOTE — Telephone Encounter (Signed)
Pls sch appt with me Thanks

## 2019-03-23 NOTE — Telephone Encounter (Signed)
Spoke with the patient this morning.  She is unable to schedule an appointment as she is currently in the [facility] and does not know when she is to be D/C.  Pt would like to wait to schedule until then.  Please advise.

## 2019-03-25 ENCOUNTER — Other Ambulatory Visit: Payer: Self-pay | Admitting: Internal Medicine

## 2019-03-28 NOTE — Telephone Encounter (Signed)
I received refill request for amlodipine. I have not seen her for quite some time. Most recent medical interactions were with [facility]. DC summary by [facility]TS 1/6 listed amlodipine on med list. CVTS telehealth appt on 1/13 did not list amlodipine.Would you pls clarify is she is to be on amlodipine? Thanks

## 2019-03-28 NOTE — Telephone Encounter (Signed)
I had amlodipine listed on my med list when I did a VV. I will call it in to her pharmacy. Thank you

## 2019-03-28 NOTE — Telephone Encounter (Signed)
Thank you :)

## 2019-03-30 NOTE — Progress Notes (Addendum)
HEART AND VASCULAR CENTER   MULTIDISCIPLINARY HEART VALVE TEAM  Virtual Visit via Telephone Note   This visit type was conducted due to national recommendations for restrictions regarding the COVID-19 Pandemic (e.g. social distancing) in an effort to limit this patient's exposure and mitigate transmission in our community.  Due to her co-morbid illnesses, this patient is at least at moderate risk for complications without adequate follow up.  This format is felt to be most appropriate for this patient at this time.  The patient did not have access to video technology/had technical difficulties with video requiring transitioning to audio format only (telephone).  All issues noted in this document were discussed and addressed.  No physical exam could be performed with this format.  Please refer to the patient's chart for her  consent to telehealth for [facility].   Evaluation Performed:  Follow-up visit  Date:  03/31/2019   ID:  [name], DOB 05-Apr-1945, MRN [id number]  Patient Location: [place] Provider Location: Office  PCP:  [name], MD  Cardiologist:  [name], MD / Dr. Burt Knack & Dr. Cyndia Bent (TAVR) Electrophysiologist: Dr. Curt Bears    Chief Complaint:  1 month s/p TAVR  (echo cancelled due to covid restrictions at her facility)  History of Present Illness:    [name] is a 74 y.o. female with a history of CAD and aortic stenosis s/p pericardial tissue AVR & CABGx1 (SVG--> RCA) in 2006, post operative CVA, CKD stage III (creat baseline ~1.5), IDDM, anemia, chronic diastolic CHF, breast CA s/p mastectomy and tamoxifen, HTN, HLD, Jehovah's witness faith, CHB s/p PPM (12/16), severe mitral stenosis and severe AS s/p TAVR (03/01/19) who presents via telemedicine for follow up. She was originally set up for an in office visit with an echo but she had to cancel and convert to virtual bc of covid restrictions at her nursing facility.   The patient  does not have symptoms concerning for COVID-19 infection (fever, chills, cough, or new shortness of breath).   She was recently admitted for CHB and acute CHF. She underwent successful PPM placement and found to have severe AS with a mean gradient of >50 mm Hg. She was discharged to a [facility] with plans for staged TAVR.  She underwent successful TAVR with a  Medtronic Evolut Pro Plus 23 mm THV via the TF approach on 03/01/19. Post op echo showed normally functioning TAVR with a mean gradient of 9 mm Hg and no PVL, moderate MS. She was discharged back to previous SNF.  Today she presents for follow up via virtual medicine. Still at the SNF but leaving next week. No CP or SOB. No LE edema, orthopnea or PND. No dizziness or syncope. No blood in stool or urine. No palpitations. BP has been elevated at night.    Past Medical History:  Diagnosis Date  . Adenocarcinoma of breast ([place]) 1996   Completed tamoxifen and had mastectomy.  . Aortic stenosis, severe    s/p aortic valve replacement with porcine valve 06/2004.  ECHO 2010 EF 67%, LVH, diastolic dysfxn, Bioprostetic aoritc valve, mild AS. ECHO 2013 EF 60%, Nl aortic artificial valve, dynamic obstruction in the outflow tract   Class IIb rec for annual TTE after 5 yrs. She had a TTE 2013    . Arthritis   . CAD (coronary artery disease) 2006   s/p CABG (5/06) w/ saphenous vein to RCA at time of AVR  . CKD (chronic kidney disease) stage  2, GFR 60-89 ml/min 06/03/2011   She is no longer taking NSAIDs.   Marland Kitchen CVA (cerebral infarction) 2006   Post-op from AVR. Presumed embolic in nature. Carotid stenosis of R 60-79%. Repeat dopplers 4/10 no R stenosis and L stenosis of 1-29%.  . Depression    Controlled on Paxil  . Diabetes mellitus 1992   Dx 04/25/1990. Now insulin dependent, started 2008. On ACEI.   . Diverticulosis 2001  . History of 2019 novel coronavirus disease (COVID-19)   . Hyperlipidemia    Mgmt with a statin  . Hypertension     Requires 4 drug tx  . Osteoporosis 2006   DEXA 10/06 : L femur T -2.8, R -2.7. Lumbar T -2.4. On bisphosphonates and  Calcium / Vit D.  . Presence of permanent cardiac pacemaker   . Refusal of blood transfusions as patient is Jehovah's Witness   . Stroke [facility])    Past Surgical History:  Procedure Laterality Date  . ABDOMINAL HYSTERECTOMY  1987   for fibroids  . AORTIC VALVE REPLACEMENT  2006  . CHOLECYSTECTOMY    . CORONARY ARTERY BYPASS GRAFT  2006   Saphenous vein to RCA at time of AVR. Course complicated by acute respiratory failure, post-op PTX, ARI, ileus, CVA  . MASTECTOMY Left 1995   L for adenocarcinoma  . PACEMAKER IMPLANT N/A 02/09/2019   Procedure: PACEMAKER IMPLANT;  Surgeon: Constance Haw, MD;  Location: [place] CV LAB;  Service: Cardiovascular;  Laterality: N/A;  . RIGHT HEART CATH AND CORONARY/GRAFT ANGIOGRAPHY N/A 02/09/2019   Procedure: RIGHT HEART CATH AND CORONARY/GRAFT ANGIOGRAPHY;  Surgeon: Sherren Mocha, MD;  Location: [place] CV [facility];  Service: Cardiovascular;  Laterality: N/A;  . TRANSCATHETER AORTIC VALVE REPLACEMENT, TRANSFEMORAL N/A 03/01/2019   Procedure: TRANSCATHETER AORTIC VALVE REPLACEMENT, TRANSFEMORAL;  Surgeon: Sherren Mocha, MD;  Location: [place] CV [facility];  Service: Open Heart Surgery;  Laterality: N/A;     Current Meds  Medication Sig  . acetaminophen (TYLENOL) 500 MG tablet Take 2 tablets (1,000 mg total) by mouth every 6 (six) hours as needed.  Marland Kitchen amLODipine (NORVASC) 5 MG tablet Take 1 tablet (5 mg total) by mouth daily.  Marland Kitchen aspirin 81 MG EC tablet TAKE 1 TABLET (81 MG TOTAL) BY MOUTH DAILY. SWALLOW WHOLE. (Patient taking differently: Take 81 mg by mouth daily. Swallow whole)  . atorvastatin (LIPITOR) 80 MG tablet TAKE 1 TABLET BY MOUTH AT BEDTIME  . clopidogrel (PLAVIX) 75 MG tablet Take 1 tablet (75 mg total) by mouth daily with breakfast.  . furosemide (LASIX) 40 MG tablet TAKE 1 TABLET (40 MG TOTAL) BY MOUTH DAILY.  Marland Kitchen  glucose blood (CONTOUR NEXT TEST) test strip Use to check blood sugar 4 times daily. diag code E11.22. insulin dependent  . Insulin Degludec-Liraglutide (XULTOPHY) 100-3.6 UNIT-MG/ML SOPN Inject 10 Units into the skin daily.  . Insulin Pen Needle (PEN NEEDLES 3/16") 31G X 5 MM MISC 1 each by Does not apply route 4 (four) times daily. Use to check blood sugars 4 times a day. Dx code E11.22  . metoprolol succinate (TOPROL-XL) 25 MG 24 hr tablet Take 1 tablet (25 mg total) by mouth daily.  Marland Kitchen PARoxetine (PAXIL) 30 MG tablet TAKE 1 TABLET BY MOUTH DAILY (Patient taking differently: Take 30 mg by mouth daily. )  . polyethylene glycol (MIRALAX / GLYCOLAX) packet Take 17 g by mouth daily. (Patient taking differently: Take 17 g by mouth daily as needed for mild constipation. )  Allergies:   Other   Social History   Tobacco Use  . Smoking status: Never Smoker  . Smokeless tobacco: Never Used  Substance Use Topics  . Alcohol use: Yes    Alcohol/week: 0.0 standard drinks    Comment: Occasional beer, monthly  . Drug use: No     Family Hx: The patient's family history includes Alzheimer's disease in her mother; Diabetes in her mother; Heart disease (age of onset: 18) in her sister; Heart disease (age of onset: 52) in her father; Hypertension in her mother; Kidney disease in her sister; Mental illness in her sister.  ROS:   Please see the history of present illness.    All other systems reviewed and are negative.   Prior CV studies:   The following studies were reviewed today:  TAVR OPERATIVE NOTE  Date of Procedure: 03/01/2019  Preoperative Diagnosis:Severe Aortic Stenosis   Postoperative Diagnosis:Same   Procedure:   Valve in Valve Transcatheter Aortic Valve Replacement - Percutaneous RightTransfemoral Approach Medtronic Evolut-Pro+ (size 79mm, model # EVPROPLUS-23US, serial # [id number])  Co-Surgeons:Bryan Alveria Apley, MD and Sherren Mocha, MD   Anesthesiologist:James D. Tobias Alexander, MD  Echocardiographer:Peter Johnsie Cancel, MD  Pre-operative Echo Findings: ? Severe prosthetic aortic valve stenosis  ? Normalleft ventricular systolic function  Post-operative Echo Findings: ? trivialparavalvular leak ? Normalleft ventricular systolic function ? Normal mean prosthetic transvalvular gradient of 4 mm Hg.  _______________  Echo 03/01/18 IMPRESSIONS  1. Left ventricular ejection fraction, by visual estimation, is 65 to 70%. The left ventricle has hyperdynamic function. There is severely increased left ventricular hypertrophy.  2. Elevated left ventricular end-diastolic pressure.  3. Left ventricular diastolic parameters are consistent with Grade I diastolic dysfunction (impaired relaxation).  4. The left ventricle has no regional wall motion abnormalities.  5. Global right ventricle has normal systolic function.The right ventricular size is normal. No increase in right ventricular wall thickness.  6. Left atrial size was moderately dilated.  7. Right atrial size was mildly dilated.  8. Moderate calcification of the mitral valve leaflet(s).  9. Moderate thickening of the mitral valve leaflet(s). 10. Severe mitral annular calcification. 11. The mitral valve is degenerative. Mild mitral valve regurgitation. Moderate mitral stenosis. 12. The tricuspid valve is normal in structure. 13. Aortic valve regurgitation is not visualized. No evidence of aortic valve sclerosis or stenosis. 14. The pulmonic valve was normal in structure. Pulmonic valve regurgitation is not visualized. 15. A pacer wire is visualized. 16. The inferior vena cava is normal in size with greater than 50% respiratory variability, suggesting right atrial pressure of 3 mmHg. 17. Supra-annular valve, 23 Medtronic Evolut valve, stable transaortic gradients peak/mean 16/9 mmHg, trivial PVL. 18. MV peak  gradient, 15.8 mmHg.   Labs/Other Tests and Data Reviewed:    EKG:  No ECG reviewed.  Recent Labs: 02/07/2019: TSH 1.630 02/24/2019: ALT 13; B Natriuretic Peptide 408.4 03/02/2019: BUN 24; Creatinine, Ser 1.47; Hemoglobin 10.9; Magnesium 1.7; Platelets 222; Potassium 3.7; Sodium 140   Recent Lipid Panel Lab Results  Component Value Date/Time   CHOL 99 02/07/2019 08:12 PM   CHOL 148 05/25/2015 11:06 AM   TRIG 117 02/07/2019 08:12 PM   HDL 26 (L) 02/07/2019 08:12 PM   HDL 41 05/25/2015 11:06 AM   CHOLHDL 3.8 02/07/2019 08:12 PM   LDLCALC 50 02/07/2019 08:12 PM   LDLCALC 79 05/25/2015 11:06 AM    Wt Readings from Last 3 Encounters:  03/31/19 163 lb 4.8 oz (74.1 kg)  03/03/19 164 lb 3.9 oz (  74.5 kg)  02/24/19 164 lb 6.4 oz (74.6 kg)     Objective:    Vital Signs:  BP (!) 171/86   Pulse 72   Temp (!) 97.5 F (36.4 C)   Wt 163 lb 4.8 oz (74.1 kg)   SpO2 98%   BMI 26.36 kg/m    ASSESSMENT & PLAN:     Severe AS s/p TAVR:  She was originally set up for an in office visit with an echo but she had to cancel and convert to virtual bc of covid restrictions at her nursing facility. She is doing well with PT at the facility and set to discharge next Thursday. I will get her echo set up in a couple weeks. She has NYHA class I symptoms. SBE prophylaxis discussed; I have RX'd amoxicillin.   CHB s/p PPM: followed by Dr. Curt Bears.  Chronic diastolic CHF: no s/s CHF. Continue lasix $RemoveBefore'40mg'vItYZKObXszHO$  daily.   Mitral stenosis: moderate by post op echo.  HTN: BP was initially 171/86, but rechecked by the SNF NP while I was on the phone with them and 124/64. The NP reviewed her log and noted that she seemed to have some high readings at night. I have asked them to keep an eye on this.   COVID-19 Education: The signs and symptoms of COVID-19 were discussed with the patient and how to seek care for testing (follow up with PCP or arrange E-visit).  The importance of social distancing was discussed  today.  Time:   Today, I have spent 15 minutes with the patient with telehealth technology discussing the above problems.     Medication Adjustments/Labs and Tests Ordered: Current medicines are reviewed at length with the patient today.  Concerns regarding medicines are outlined above.   Tests Ordered: No orders of the defined types were placed in this encounter.   Medication Changes: Meds ordered this encounter  Medications  . amoxicillin (AMOXIL) 500 MG tablet    Sig: Take 4 capsules (2,000 mg) one hour prior to all dental visits.    Dispense:  8 tablet    Refill:  11     Disposition:  Follow up with echo in 2 weeks. Dr Meda Coffee in 3 months  Signed, Angelena Form, PA-C  03/31/2019 3:48 PM    [place]

## 2019-03-31 ENCOUNTER — Telehealth (INDEPENDENT_AMBULATORY_CARE_PROVIDER_SITE_OTHER): Payer: Medicare Other | Admitting: Physician Assistant

## 2019-03-31 ENCOUNTER — Other Ambulatory Visit: Payer: Self-pay

## 2019-03-31 ENCOUNTER — Telehealth: Payer: Medicare Other | Admitting: Physician Assistant

## 2019-03-31 ENCOUNTER — Other Ambulatory Visit (HOSPITAL_COMMUNITY): Payer: Medicare Other

## 2019-03-31 VITALS — BP 171/86 | HR 72 | Temp 97.5°F | Wt 163.3 lb

## 2019-03-31 DIAGNOSIS — Z950 Presence of cardiac pacemaker: Secondary | ICD-10-CM

## 2019-03-31 DIAGNOSIS — I10 Essential (primary) hypertension: Secondary | ICD-10-CM

## 2019-03-31 DIAGNOSIS — Z952 Presence of prosthetic heart valve: Secondary | ICD-10-CM | POA: Diagnosis not present

## 2019-03-31 DIAGNOSIS — I050 Rheumatic mitral stenosis: Secondary | ICD-10-CM

## 2019-03-31 DIAGNOSIS — I5032 Chronic diastolic (congestive) heart failure: Secondary | ICD-10-CM

## 2019-03-31 MED ORDER — AMOXICILLIN 500 MG PO TABS: ORAL_TABLET | ORAL | 11 refills | Status: DC

## 2019-03-31 NOTE — Patient Instructions (Addendum)
Medication Instructions:  1) Your provider discussed the importance of taking an antibiotic prior to all dental visits to prevent damage to the heart valves from infection. You were given a prescription for AMOXIL 2,000 mg to take one hour prior to any dental appointment.   Testing: Your provider has requested that you have an echocardiogram. Echocardiography is a painless test that uses sound waves to create images of your heart. It provides your doctor with information about the size and shape of your heart and how well your heart's chambers and valves are working. This procedure takes approximately one hour. There are no restrictions for this procedure. You are scheduled for your echocardiogram on 04/22/2019 at 11:15AM. Please arrive by 11:00AM for this appointment.     Follow-Up: You have an appointment with Dr. [name] on 07/07/19 at 11:00AM. Please arrive by 10:45AM for this appointment.  You will be called to arrange your 1 year TAVR echo and office visit.

## 2019-03-31 NOTE — Addendum Note (Signed)
Addended by: Harland German A on: 03/31/2019 04:00 PM   Modules accepted: Orders

## 2019-04-08 ENCOUNTER — Telehealth: Payer: Self-pay | Admitting: Internal Medicine

## 2019-04-08 NOTE — Telephone Encounter (Signed)
VO for PT , start of care, pt wanted to start Monday instead of today, VO given for 2/15 [facility] PT assess. Do you agree?

## 2019-04-08 NOTE — Telephone Encounter (Signed)
[name] needs VO [phone]

## 2019-04-08 NOTE — Telephone Encounter (Signed)
agree

## 2019-04-11 ENCOUNTER — Telehealth: Payer: Self-pay | Admitting: Internal Medicine

## 2019-04-11 NOTE — Telephone Encounter (Signed)
Pls contact [facility] [phone] for VO

## 2019-04-11 NOTE — Telephone Encounter (Signed)
agree

## 2019-04-11 NOTE — Telephone Encounter (Signed)
[facility] PT 2x week for 4 weeks, VO given, do you agree? PT also reminded pt to call for appt w/ dr Software engineer for disch from rehab facility, will send to front office for appt

## 2019-04-22 ENCOUNTER — Other Ambulatory Visit: Payer: Self-pay

## 2019-04-22 ENCOUNTER — Ambulatory Visit (HOSPITAL_COMMUNITY): Payer: Medicare Other | Attending: Internal Medicine

## 2019-04-22 DIAGNOSIS — I2510 Atherosclerotic heart disease of native coronary artery without angina pectoris: Secondary | ICD-10-CM | POA: Diagnosis not present

## 2019-04-22 DIAGNOSIS — E785 Hyperlipidemia, unspecified: Secondary | ICD-10-CM | POA: Insufficient documentation

## 2019-04-22 DIAGNOSIS — Z952 Presence of prosthetic heart valve: Secondary | ICD-10-CM | POA: Insufficient documentation

## 2019-04-22 DIAGNOSIS — E119 Type 2 diabetes mellitus without complications: Secondary | ICD-10-CM | POA: Diagnosis not present

## 2019-04-22 DIAGNOSIS — Z950 Presence of cardiac pacemaker: Secondary | ICD-10-CM | POA: Diagnosis not present

## 2019-04-22 DIAGNOSIS — I509 Heart failure, unspecified: Secondary | ICD-10-CM | POA: Insufficient documentation

## 2019-04-22 DIAGNOSIS — I110 Hypertensive heart disease with heart failure: Secondary | ICD-10-CM | POA: Insufficient documentation

## 2019-04-25 ENCOUNTER — Telehealth: Payer: Self-pay | Admitting: Internal Medicine

## 2019-04-25 NOTE — Telephone Encounter (Signed)
[place] calling to f/u with incont/supplies for medium Pullups/ 23 x 36underpads/ medium gloves and protective mask.  Please call back to [facility] [phone].

## 2019-04-26 NOTE — Telephone Encounter (Signed)
Returned call to [name] at [employer]. Forms were signed by PCP, however, the Medical and Functional Status part of the CMN was unable to be completed as she has not seen patient in > 2 years. Danae Chen will fax blank forms to [place] and patient will be scheduled for telehealth appt to discuss need for incontinence supplies and disposable masks. Hubbard Hartshorn, BSN, RN-BC

## 2019-04-26 NOTE — Telephone Encounter (Signed)
Spoke with the patient.  Telehealth Appt has been sch for 04/28/2019 @ 8:45am  with [facility].

## 2019-04-27 ENCOUNTER — Ambulatory Visit (INDEPENDENT_AMBULATORY_CARE_PROVIDER_SITE_OTHER): Payer: Medicare Other | Admitting: Internal Medicine

## 2019-04-27 ENCOUNTER — Encounter: Payer: Self-pay | Admitting: Internal Medicine

## 2019-04-27 ENCOUNTER — Other Ambulatory Visit: Payer: Self-pay

## 2019-04-27 DIAGNOSIS — N183 Chronic kidney disease, stage 3 unspecified: Secondary | ICD-10-CM

## 2019-04-27 DIAGNOSIS — R32 Unspecified urinary incontinence: Secondary | ICD-10-CM

## 2019-04-27 DIAGNOSIS — I5033 Acute on chronic diastolic (congestive) heart failure: Secondary | ICD-10-CM

## 2019-04-27 NOTE — Progress Notes (Signed)
Internal Medicine Clinic Attending  Case discussed with Dr. Melvin  at the time of the visit.  We reviewed the resident's history and exam and pertinent patient test results.  I agree with the assessment, diagnosis, and plan of care documented in the resident's note.  

## 2019-04-27 NOTE — Telephone Encounter (Signed)
CMN and order for incontinence supplies, and order for masks, along with OV notes from today's visit faxed to [facility] at [phone]. Confirmation receipt received. Hubbard Hartshorn, BSN, RN-BC

## 2019-04-27 NOTE — Assessment & Plan Note (Addendum)
Telephone visit conducted to evaluate the status of patient's incontinence due to CKD and worsened with diuretics for heart failure. She continue to require incontinence supplies, gloves, and masks to help maintain her functional level. She has had issues with incontinence for many years. Earliest lab work indicating CKD was in 2016 (though problem notes 1992 as original date). Diastolic heart failure has been known since 2013 when it was noted on echo. Has had to start diuretics and has been admitted for exacerbation in the last year. - Form will be filled out for supplies - Continue current medications

## 2019-04-27 NOTE — Patient Instructions (Signed)
Verbal instructions for telephone visit

## 2019-04-27 NOTE — Progress Notes (Signed)
   This is a telephone encounter between [name] (and her daughter) and Neva Seat on 04/27/2019. The visit was conducted with the patient located at home and Neva Seat at [facility]. The patient's identity was confirmed using their DOB and current address. The patient (and her daughter) consented to being evaluated through a telephone encounter and understands the associated risks (an examination cannot be done and the patient may need to come in for an appointment) / benefits (allows the patient to remain at home, decreasing exposure to coronavirus). I personally spent 10 minutes on medical discussion.  CC: Incontinence, CKD, Chronic diastolic heart failure  HPI:  [name] is a 74 y.o. F with PMHx listed below presenting for Incontinence, CKD, Chronic diastolic heart failure. Please see the A&P for the status of the patient's chronic medical problems.  Past Medical History:  Diagnosis Date  . Adenocarcinoma of breast ([place]) 1996   Completed tamoxifen and had mastectomy.  . Aortic stenosis, severe    s/p aortic valve replacement with porcine valve 06/2004.  ECHO 2010 EF 51%, LVH, diastolic dysfxn, Bioprostetic aoritc valve, mild AS. ECHO 2013 EF 60%, Nl aortic artificial valve, dynamic obstruction in the outflow tract   Class IIb rec for annual TTE after 5 yrs. She had a TTE 2013    . Arthritis   . CAD (coronary artery disease) 2006   s/p CABG (5/06) w/ saphenous vein to RCA at time of AVR  . CKD (chronic kidney disease) stage 2, GFR 60-89 ml/min 06/03/2011   She is no longer taking NSAIDs.   Marland Kitchen CVA (cerebral infarction) 2006   Post-op from AVR. Presumed embolic in nature. Carotid stenosis of R 60-79%. Repeat dopplers 4/10 no R stenosis and L stenosis of 1-29%.  . Depression    Controlled on Paxil  . Diabetes mellitus 1992   Dx 04/25/1990. Now insulin dependent, started 2008. On ACEI.   . Diverticulosis 2001  . History of 2019 novel coronavirus disease (COVID-19)   .  Hyperlipidemia    Mgmt with a statin  . Hypertension    Requires 4 drug tx  . Osteoporosis 2006   DEXA 10/06 : L femur T -2.8, R -2.7. Lumbar T -2.4. On bisphosphonates and  Calcium / Vit D.  . Presence of permanent cardiac pacemaker   . Refusal of blood transfusions as patient is Jehovah's Witness   . Stroke [facility])    Review of Systems:  Performed and all others negative.  Physical Exam:  There were no vitals filed for this visit. Not performed for telephone visit  Assessment & Plan:   See Encounters Tab for problem based charting.  Patient discussed with Dr. Dareen Piano

## 2019-04-28 ENCOUNTER — Ambulatory Visit: Payer: Medicare Other | Admitting: Physician Assistant

## 2019-04-28 ENCOUNTER — Telehealth: Payer: Self-pay

## 2019-04-28 NOTE — Telephone Encounter (Signed)
The patient has been notified of the Echo result and verbalized understanding.  All questions (if any) were answered. Frederik Schmidt, RN 04/28/2019 9:08 AM

## 2019-04-28 NOTE — Telephone Encounter (Signed)
-----   Message from Eileen Stanford, [place] sent at 04/25/2019 11:18 AM EST ----- Can you let the pt know that her echo shows normal heart function, moderate mitral stenosis and normally functioning aortic valve replacement.

## 2019-04-29 ENCOUNTER — Telehealth: Payer: Self-pay

## 2019-04-29 NOTE — Telephone Encounter (Signed)
ABIs in 2019 were normal so lower extremity compression wrapping is likely okay.  I cannot comment on the weight gain as I have not seen her in many years and I do not know if this is heart failure exacerbation or something else.  I agree with the [facility] appointment.  Thank you

## 2019-04-29 NOTE — Telephone Encounter (Signed)
Received TC from [place], [place] with [facility].  States pt has gained 5lbs in 1 week and has BLE edema, 3+left and 2+ right.  Reports b/p 146/76, O2 sats 99% and all other VSS.  Reports clear lung sounds, no SOB.  RN spoke to pt, no SOB noted with conversation.  [facility] appt offered to pt for Monday, pt requested appt on Wednesday of next week, appt made on Wednesday, 05/04/19 @ 1045 in [facility]. [place] nurse asking if MD would like any wraps or compression hose applied to legs. Will forward to PCP to advise and for any further recommendations. Thank you, SChaplin, RN,BSN

## 2019-05-02 ENCOUNTER — Telehealth: Payer: Self-pay

## 2019-05-02 NOTE — Telephone Encounter (Signed)
Cont HH PT 2x week for 2 weeks  1x week for 3 weeks Improve ambulation, safety, posturing, balance VO given, do you agree?

## 2019-05-02 NOTE — Telephone Encounter (Signed)
yes

## 2019-05-02 NOTE — Telephone Encounter (Signed)
[place] with [facility] requesting VO for PT. Please call pt back.

## 2019-05-03 ENCOUNTER — Other Ambulatory Visit: Payer: Self-pay | Admitting: Internal Medicine

## 2019-05-03 DIAGNOSIS — E1165 Type 2 diabetes mellitus with hyperglycemia: Secondary | ICD-10-CM

## 2019-05-03 DIAGNOSIS — IMO0002 Reserved for concepts with insufficient information to code with codable children: Secondary | ICD-10-CM

## 2019-05-03 DIAGNOSIS — E1122 Type 2 diabetes mellitus with diabetic chronic kidney disease: Secondary | ICD-10-CM

## 2019-05-04 ENCOUNTER — Other Ambulatory Visit: Payer: Self-pay

## 2019-05-04 ENCOUNTER — Ambulatory Visit (INDEPENDENT_AMBULATORY_CARE_PROVIDER_SITE_OTHER): Payer: Medicare Other | Admitting: Internal Medicine

## 2019-05-04 ENCOUNTER — Encounter: Payer: Self-pay | Admitting: Internal Medicine

## 2019-05-04 VITALS — BP 135/57 | HR 74 | Wt 166.1 lb

## 2019-05-04 DIAGNOSIS — Z794 Long term (current) use of insulin: Secondary | ICD-10-CM | POA: Diagnosis not present

## 2019-05-04 DIAGNOSIS — IMO0002 Reserved for concepts with insufficient information to code with codable children: Secondary | ICD-10-CM

## 2019-05-04 DIAGNOSIS — I10 Essential (primary) hypertension: Secondary | ICD-10-CM | POA: Diagnosis not present

## 2019-05-04 DIAGNOSIS — I5033 Acute on chronic diastolic (congestive) heart failure: Secondary | ICD-10-CM

## 2019-05-04 DIAGNOSIS — N183 Chronic kidney disease, stage 3 unspecified: Secondary | ICD-10-CM | POA: Diagnosis not present

## 2019-05-04 DIAGNOSIS — E1122 Type 2 diabetes mellitus with diabetic chronic kidney disease: Secondary | ICD-10-CM | POA: Diagnosis not present

## 2019-05-04 DIAGNOSIS — E1165 Type 2 diabetes mellitus with hyperglycemia: Secondary | ICD-10-CM | POA: Diagnosis not present

## 2019-05-04 DIAGNOSIS — N182 Chronic kidney disease, stage 2 (mild): Secondary | ICD-10-CM

## 2019-05-04 MED ORDER — FUROSEMIDE 40 MG PO TABS: 40.0000 mg | ORAL_TABLET | Freq: Every day | ORAL | 5 refills | Status: DC | PRN

## 2019-05-04 NOTE — Assessment & Plan Note (Addendum)
Patient with 2+ pitting edema up to the mid shins bilaterally.  Patient reports taking 40 mg daily without missing doses.  Patient reports no recent changes in diet.  She states she eats hotdogs for lunch.  I counseled patient on avoiding high salt foods.  Patient with weight today of 166 pounds, up from 164 pounds on last discharge.  Patient without chest pain, shortness of breath, orthopnea, PND.  Plan: *Lasix 40 mg twice daily for the next 3 days, followed by Lasix 40 mg once daily.  Patient was instructed to take an additional dose of Lasix 40 mg (total twice daily) if weight exceeds 164 pounds.  Patient states her home scale reads differently than our scale.  Patient instructed that after 3 days if her lower extremity swelling has normalized she can treat her new weight as her dry weight and take the additional Lasix dosage if weight exceeds this dry weight.

## 2019-05-04 NOTE — Assessment & Plan Note (Signed)
Referral placed for podiatry

## 2019-05-04 NOTE — Assessment & Plan Note (Addendum)
Patient currently on regimen of amlodipine 5 mg daily + metoprolol 25 mg daily.  Patient was discontinued from hydralazine after last hospitalization and per chart review lisinopril was discontinued after hospitalization in May 2019. On review of lab work from January 2021, last eGFR estimate of 35-40.  Blood pressure elevated to 147/62 and 135/57 on recheck during this encounter, hypertension is asymptomatic.  Plan: *Continue amlodipine 5 mg daily + carvedilol 25 mg daily *Will recheck BMP today and consider restarting patient on lisinopril based on renal function/electrolytes.  Patient would benefit from renal protective as well as antihypertensive benefit, and medication may help with patient swelling if swelling is secondary to amlodipine.

## 2019-05-04 NOTE — Patient Instructions (Addendum)
You were seen for followup on your leg swelling. Here are my recommendations  1) Take 40 mg of lasix twice a day for the next 3 days. Afterwards, measure your weight every day in the morning and if your weight greater than 164 pounds take lasix 40 mg twice that day (once in morning, and once in afternoon). If your weight is less than 164 pounds, take lasix 40 mg once that day  2) We have collected blood work to check your kidney function and electrolytes. I will call you with these results. Based on these results, we may advise you start another medication for your blood pressure.   3) We have sent a referral to podiatry, they will call you to schedule an appointment  Thank you for allowing [place] to be part of your medical care!

## 2019-05-04 NOTE — Progress Notes (Signed)
   CC: Lower extremity swelling  HPI: Patient is a 74 year old female with past medical history as below who presents for follow-up on lower extremity swelling.  [name] is a 74 y.o.   Past Medical History:  Diagnosis Date  . Acute on chronic diastolic CHF (congestive heart failure) ([place]) 06/22/2017  . Acute on chronic renal failure ([place]) 05/25/2011  . Adenocarcinoma of breast ([place]) 1996   Completed tamoxifen and had mastectomy.  . Aortic stenosis, severe    s/p aortic valve replacement with porcine valve 06/2004.  ECHO 2010 EF 38%, LVH, diastolic dysfxn, Bioprostetic aoritc valve, mild AS. ECHO 2013 EF 60%, Nl aortic artificial valve, dynamic obstruction in the outflow tract   Class IIb rec for annual TTE after 5 yrs. [name] had a TTE 2013    . Arthritis   . CAD (coronary artery disease) 2006   s/p CABG (5/06) w/ saphenous vein to RCA at time of AVR  . CKD (chronic kidney disease) stage 2, GFR 60-89 ml/min 06/03/2011   [name] is no longer taking NSAIDs.   Marland Kitchen CVA (cerebral infarction) 2006   Post-op from AVR. Presumed embolic in nature. Carotid stenosis of R 60-79%. Repeat dopplers 4/10 no R stenosis and L stenosis of 1-29%.  . Depression    Controlled on Paxil  . Diabetes mellitus 1992   Dx 04/25/1990. Now insulin dependent, started 2008. On ACEI.   . Diverticulosis 2001  . History of 2019 novel coronavirus disease (COVID-19)   . Hyperlipidemia    Mgmt with a statin  . Hypertension    Requires 4 drug tx  . Osteoporosis 2006   DEXA 10/06 : L femur T -2.8, R -2.7. Lumbar T -2.4. On bisphosphonates and  Calcium / Vit D.  . Presence of permanent cardiac pacemaker   . Refusal of blood transfusions as patient is Jehovah's Witness   . Stroke [facility])    Review of Systems:   Review of Systems  Respiratory: Negative for shortness of breath.   Cardiovascular: Positive for leg swelling. Negative for chest pain, orthopnea and PND.  All other systems reviewed and are negative.   Physical  Exam:  Vitals:   05/04/19 1050 05/04/19 1128  BP: (!) 147/62 (!) 135/57  Pulse: 80 74  SpO2: 99%   Weight: 166 lb 1.6 oz (75.3 kg)    Physical Exam  Constitutional: [name] is well-developed, well-nourished, and in no distress.  HENT:  Head: Normocephalic and atraumatic.  Eyes: EOM are normal. Right eye exhibits no discharge. Left eye exhibits no discharge.  Neck: No tracheal deviation present.  Cardiovascular: Normal rate and regular rhythm. Exam reveals no gallop and no friction rub.  Murmur (consistent with mechanical aortic valve) heard. Pulmonary/Chest: Effort normal and breath sounds normal. No respiratory distress. [name] has no wheezes. [name] has no rales.  Abdominal: Soft. [name] exhibits no distension. There is no abdominal tenderness. There is no rebound and no guarding.  Musculoskeletal:        General: Edema (2+ pitting edema up to mid-shins) present. No tenderness or deformity. Normal range of motion.     Cervical back: Normal range of motion.  Neurological: [name] is alert. Coordination normal.  Skin: Skin is warm and dry. No rash noted. [name] is not diaphoretic. No erythema.  Psychiatric: Memory and judgment normal.     Assessment & Plan:   See Encounters Tab for problem based charting.  Patient discussed with Dr. Rebeca Alert

## 2019-05-05 LAB — BMP8+ANION GAP
Anion Gap: 16 mmol/L (ref 10.0–18.0)
BUN/Creatinine Ratio: 16 (ref 12–28)
BUN: 28 mg/dL — ABNORMAL HIGH (ref 8–27)
CO2: 25 mmol/L (ref 20–29)
Calcium: 8.9 mg/dL (ref 8.7–10.3)
Chloride: 98 mmol/L (ref 96–106)
Creatinine, Ser: 1.72 mg/dL — ABNORMAL HIGH (ref 0.57–1.00)
GFR calc Af Amer: 33 mL/min/1.73 — ABNORMAL LOW
GFR calc non Af Amer: 29 mL/min/1.73 — ABNORMAL LOW
Glucose: 309 mg/dL — ABNORMAL HIGH (ref 65–99)
Potassium: 4 mmol/L (ref 3.5–5.2)
Sodium: 139 mmol/L (ref 134–144)

## 2019-05-05 NOTE — Progress Notes (Signed)
Internal Medicine Clinic Attending  Case discussed with Dr. MacLean at the time of the visit.  We reviewed the resident's history and exam and pertinent patient test results.  I agree with the assessment, diagnosis, and plan of care documented in the resident's note.  Weber Monnier, M.D., Ph.D.  

## 2019-05-09 ENCOUNTER — Other Ambulatory Visit: Payer: Self-pay | Admitting: Internal Medicine

## 2019-05-09 NOTE — Progress Notes (Signed)
Called patient to discuss lab results. Informed of elevated creatinine. Patient states she has not yet picked up the increased dose of lasix. Counseled patient to pickup this prescription and to take it PRN as previously discussed. Patient should return for appointment early next week for repeat BMP and assessment of lower extremity edema.  Jeanmarie Hubert, MD 05/09/2019, 10:01 AM

## 2019-05-11 ENCOUNTER — Encounter: Payer: Self-pay | Admitting: *Deleted

## 2019-05-17 ENCOUNTER — Ambulatory Visit (INDEPENDENT_AMBULATORY_CARE_PROVIDER_SITE_OTHER): Payer: Medicare Other | Admitting: *Deleted

## 2019-05-17 DIAGNOSIS — Z95810 Presence of automatic (implantable) cardiac defibrillator: Secondary | ICD-10-CM

## 2019-05-17 LAB — CUP PACEART REMOTE DEVICE CHECK
Battery Remaining Longevity: 143 mo
Battery Voltage: 3.05 V
Brady Statistic AP VP Percent: 3.89 %
Brady Statistic AP VS Percent: 0 %
Brady Statistic AS VP Percent: 95.4 %
Brady Statistic AS VS Percent: 0.71 %
Brady Statistic RA Percent Paced: 4.39 %
Brady Statistic RV Percent Paced: 99.29 %
Date Time Interrogation Session: 20210323030445
Implantable Lead Implant Date: 20201216
Implantable Lead Implant Date: 20201216
Implantable Lead Location: 753859
Implantable Lead Location: 753860
Implantable Lead Model: 5076
Implantable Lead Model: 5076
Implantable Pulse Generator Implant Date: 20201216
Lead Channel Impedance Value: 266 Ohm
Lead Channel Impedance Value: 380 Ohm
Lead Channel Impedance Value: 437 Ohm
Lead Channel Impedance Value: 475 Ohm
Lead Channel Pacing Threshold Amplitude: 0.625 V
Lead Channel Pacing Threshold Amplitude: 0.75 V
Lead Channel Pacing Threshold Pulse Width: 0.4 ms
Lead Channel Pacing Threshold Pulse Width: 0.4 ms
Lead Channel Sensing Intrinsic Amplitude: 2.25 mV
Lead Channel Sensing Intrinsic Amplitude: 2.25 mV
Lead Channel Sensing Intrinsic Amplitude: 23 mV
Lead Channel Sensing Intrinsic Amplitude: 23 mV
Lead Channel Setting Pacing Amplitude: 1.5 V
Lead Channel Setting Pacing Amplitude: 2 V
Lead Channel Setting Pacing Pulse Width: 0.4 ms
Lead Channel Setting Sensing Sensitivity: 0.9 mV

## 2019-05-18 ENCOUNTER — Encounter: Payer: Self-pay | Admitting: Internal Medicine

## 2019-05-18 NOTE — Progress Notes (Signed)
ICD Remote  

## 2019-05-19 ENCOUNTER — Ambulatory Visit (INDEPENDENT_AMBULATORY_CARE_PROVIDER_SITE_OTHER): Payer: Medicare Other | Admitting: Internal Medicine

## 2019-05-19 VITALS — BP 176/57 | HR 70 | Wt 168.9 lb

## 2019-05-19 DIAGNOSIS — E538 Deficiency of other specified B group vitamins: Secondary | ICD-10-CM | POA: Diagnosis not present

## 2019-05-19 DIAGNOSIS — F329 Major depressive disorder, single episode, unspecified: Secondary | ICD-10-CM

## 2019-05-19 DIAGNOSIS — I5030 Unspecified diastolic (congestive) heart failure: Secondary | ICD-10-CM

## 2019-05-19 DIAGNOSIS — E1122 Type 2 diabetes mellitus with diabetic chronic kidney disease: Secondary | ICD-10-CM

## 2019-05-19 DIAGNOSIS — I700 Atherosclerosis of aorta: Secondary | ICD-10-CM | POA: Diagnosis not present

## 2019-05-19 DIAGNOSIS — Z794 Long term (current) use of insulin: Secondary | ICD-10-CM | POA: Diagnosis not present

## 2019-05-19 DIAGNOSIS — I5032 Chronic diastolic (congestive) heart failure: Secondary | ICD-10-CM | POA: Diagnosis not present

## 2019-05-19 DIAGNOSIS — Z8673 Personal history of transient ischemic attack (TIA), and cerebral infarction without residual deficits: Secondary | ICD-10-CM

## 2019-05-19 DIAGNOSIS — Z7982 Long term (current) use of aspirin: Secondary | ICD-10-CM

## 2019-05-19 DIAGNOSIS — E1151 Type 2 diabetes mellitus with diabetic peripheral angiopathy without gangrene: Secondary | ICD-10-CM

## 2019-05-19 DIAGNOSIS — Z79899 Other long term (current) drug therapy: Secondary | ICD-10-CM

## 2019-05-19 DIAGNOSIS — Z7902 Long term (current) use of antithrombotics/antiplatelets: Secondary | ICD-10-CM | POA: Diagnosis not present

## 2019-05-19 DIAGNOSIS — I2510 Atherosclerotic heart disease of native coronary artery without angina pectoris: Secondary | ICD-10-CM | POA: Diagnosis not present

## 2019-05-19 DIAGNOSIS — N3941 Urge incontinence: Secondary | ICD-10-CM | POA: Diagnosis not present

## 2019-05-19 DIAGNOSIS — R011 Cardiac murmur, unspecified: Secondary | ICD-10-CM

## 2019-05-19 DIAGNOSIS — N183 Chronic kidney disease, stage 3 unspecified: Secondary | ICD-10-CM | POA: Diagnosis not present

## 2019-05-19 DIAGNOSIS — I10 Essential (primary) hypertension: Secondary | ICD-10-CM

## 2019-05-19 DIAGNOSIS — I739 Peripheral vascular disease, unspecified: Secondary | ICD-10-CM

## 2019-05-19 DIAGNOSIS — N182 Chronic kidney disease, stage 2 (mild): Secondary | ICD-10-CM | POA: Diagnosis not present

## 2019-05-19 DIAGNOSIS — I130 Hypertensive heart and chronic kidney disease with heart failure and stage 1 through stage 4 chronic kidney disease, or unspecified chronic kidney disease: Secondary | ICD-10-CM

## 2019-05-19 DIAGNOSIS — F325 Major depressive disorder, single episode, in full remission: Secondary | ICD-10-CM

## 2019-05-19 LAB — POCT GLYCOSYLATED HEMOGLOBIN (HGB A1C): Hemoglobin A1C: 8.6 % — AB (ref 4.0–5.6)

## 2019-05-19 LAB — GLUCOSE, CAPILLARY: Glucose-Capillary: 191 mg/dL — ABNORMAL HIGH (ref 70–99)

## 2019-05-19 NOTE — Patient Instructions (Addendum)
1. Amber or Marcie Bal will contact you about more help in your home 2. Please see the foot doctor 3. Please see me in 2-4 weeks 4. I will mail you your test results 5. Call me if your weight increases by 2 pounds overnight (165 lbs) or 5 pounds (168) in one week

## 2019-05-20 ENCOUNTER — Telehealth: Payer: Self-pay | Admitting: Internal Medicine

## 2019-05-20 ENCOUNTER — Encounter: Payer: Self-pay | Admitting: Internal Medicine

## 2019-05-20 LAB — BMP8+ANION GAP
Anion Gap: 16 mmol/L (ref 10.0–18.0)
BUN/Creatinine Ratio: 19 (ref 12–28)
BUN: 29 mg/dL — ABNORMAL HIGH (ref 8–27)
CO2: 23 mmol/L (ref 20–29)
Calcium: 9 mg/dL (ref 8.7–10.3)
Chloride: 102 mmol/L (ref 96–106)
Creatinine, Ser: 1.51 mg/dL — ABNORMAL HIGH (ref 0.57–1.00)
GFR calc Af Amer: 39 mL/min/{1.73_m2} — ABNORMAL LOW (ref >59)
GFR calc non Af Amer: 34 mL/min/{1.73_m2} — ABNORMAL LOW (ref >59)
Glucose: 198 mg/dL — ABNORMAL HIGH (ref 65–99)
Potassium: 3.6 mmol/L (ref 3.5–5.2)
Sodium: 141 mmol/L (ref 134–144)

## 2019-05-20 LAB — VITAMIN B12: Vitamin B-12: 239 pg/mL (ref 232–1245)

## 2019-05-20 MED ORDER — CLOPIDOGREL BISULFATE 75 MG PO TABS: 75.0000 mg | ORAL_TABLET | Freq: Every day | ORAL | 3 refills | Status: DC

## 2019-05-20 NOTE — Assessment & Plan Note (Addendum)
This problem is chronic and uncontrolled.  She states that she weighs herself daily and her weight fluctuates around 163.  She states she has not had a steady increase in her weight.  In our clinic, her weight has increased from 163- 168 today.  With the exception of lower extremity edema, which sounds chronic and she says is better than it normally is, she did not have any signs or symptoms of volume overload.  She is on furosemide 40 mg daily and had been instructed to take it twice daily at her last appointment but was unable to do so.  I gave her written instructions and also reviewed verbally to let us know if her weight increases steadily from 163 as we can treat her at home with additional diuretics.  I will also talk to CCM to see if there is any telehealth program she can enroll him to have her weight transmitted to a supervising provider as I think she is going to have difficulty understanding the weight increase parameters.  Diuretic therapy was also complicated by her urinary incontinence which sounds primarily urge incontinence.  She uses adult underwear for this.  PLAN : cont furosemide 40 mg daily Daily weights Consult with CCM to see if there is any tele options for remote monitoring of her weights Work with home health agency for compression stockings or Unna boots

## 2019-05-20 NOTE — Assessment & Plan Note (Addendum)
This problem is chronic and stable.  We reviewed her creatinine graph and I explained that her creatinine/renal function is abnormal but is overall stable for the past 2 years.  We talked about the factors that can worsen renal function and that her age, diabetes, hypertension, heart failure, surgeries probably all played a part in her current renal insufficiency.  I have ordered a BMP today and her creatinine is 1.5, down from 1.7 but overall stable.  PLAN : Follow Review nonsteroidal avoidance at her next appointment

## 2019-05-20 NOTE — Telephone Encounter (Signed)
Talked to [place] - stated pt's BP is 170/90 and pt has not been taking Amlodipine (which was refilled on 03/28/19) and Plavix. Stated pt uses [facility] not [facility]. I will ask PCP to send Plavix rx to [place]. Also Jackelyn Poling stated pt stated she suppose to receive support hose for leg swelling. I will ask PCP about this also.  I called [facility],talked to [facility] who stated Amlodipine 5 mg  was put in recent package which was delivered/ received on 3/22. I relayed this to Haskel Schroeder who stated when she called and talked to someone else, it was not in pt's package - stated she will f/u.

## 2019-05-20 NOTE — Progress Notes (Addendum)
Subjective:    Patient ID: [name], female    DOB: Oct 15, 1945, 74 y.o.   MRN: [id number]  HPI  [name] is here for post hospitalization & SNF FU. Please see the A&P for the status of the pt's chronic medical problems.  ROS : per ROS section and in problem oriented charting. All other systems are negative.  PMHx, Soc hx, and / or Fam hx : She is living in an apartment with her 77 year old granddaughter.  Her granddaughter works.  [name] is able to walk around the apartment with her walker.  When she goes to the grocery store, she uses the store scooter.  She does not walk outside much because she does not have anything to do outside and she is unable to walk long distances even with her walker.  She denies any falls but her daughter spoke to me in the hallway privately and stated she is having many falls.  The daughter also related that she also stepped with her left foot onto broken glass (the plate she was transporting on her walker fell to the floor and cracked and she stepped on it).  She has an aide 3 days a week who helps her with her bath.  She is also getting physical therapy in her home.  [name] is able to use the toilet independently.  She is able to cook very simple meals but anything more involved is done by her aide or her granddaughter.  She does not want to go into a nursing home but is open to additional assistance in her home.  Review of Systems  Constitutional: Negative for unexpected weight change.       She has had no change in her appetite  Respiratory: Negative for shortness of breath.   Cardiovascular: Positive for leg swelling. Negative for chest pain and palpitations.  Genitourinary:       Urinary urgency and incontinence  Skin:       Thickened toenails  Psychiatric/Behavioral:       The patient is noticing memory loss       Objective:   Physical Exam Constitutional:      General: She is not in acute distress.    Appearance: Normal  appearance. She is not ill-appearing, toxic-appearing or diaphoretic.  Eyes:     General: No scleral icterus.       Right eye: No discharge.        Left eye: No discharge.  Cardiovascular:     Rate and Rhythm: Normal rate and regular rhythm.     Heart sounds: Murmur present.     Comments: Left greater than right lower extremity edema to knees.  No other pitting edema.  Harsh early systolic Pulmonary:     Effort: Pulmonary effort is normal. No respiratory distress.  Musculoskeletal:        General: No swelling, tenderness, deformity or signs of injury.  Skin:    General: Skin is warm and dry.  Neurological:     General: No focal deficit present.     Mental Status: She is alert. Mental status is at baseline.  Psychiatric:        Mood and Affect: Mood normal.        Behavior: Behavior normal.        Thought Content: Thought content normal.        Judgment: Judgment normal.       Assessment & Plan:  Billing notes Her diabetes and hypertension  are uncontrolled and combined with her diastolic heart failure, renal insufficiency, recent aortic valve replacement, and frequent falls she has many conditions that pose a threat to her life or bodily function.  I reviewed her prior labs including BMP, CBC, and B12. I ordered a B12 and a BMP.  I got additional history from her daughter regarding her falls.  I am discussing her management with our CCM team.

## 2019-05-20 NOTE — Assessment & Plan Note (Signed)
This problem is chronic and controlled on her Paxil.  She is having no side effects to this medication.  She states her depression is well controlled.  PLAN : cont meds

## 2019-05-20 NOTE — Telephone Encounter (Signed)
RN from [facility] requesting to verify medications.

## 2019-05-20 NOTE — Telephone Encounter (Signed)
Sent plavix #90 to [facility]

## 2019-05-20 NOTE — Assessment & Plan Note (Signed)
She remains on aspirin, Plavix, and atorvastatin 80 mg.  Symptoms of claudication were not elicited today but she is not walking extensively.

## 2019-05-20 NOTE — Assessment & Plan Note (Signed)
This problem is chronic and stable.  It was an incidental finding on a CT scan in 2006.  She is on high intensity statin and aspirin.  PLAN:  Cont current meds

## 2019-05-20 NOTE — Telephone Encounter (Signed)
Called / informed [name] of Plavix refill sent to [facility]. Also informed Plavix rx was  [facility]

## 2019-05-20 NOTE — Assessment & Plan Note (Signed)
This problem is chronic and stable.  She is on aspirin, clopidogrel, metoprolol, and atorvastatin 80 mg.  She has no chest pain currently.  PLAN:  Cont current meds

## 2019-05-20 NOTE — Assessment & Plan Note (Signed)
This problem is chronic and uncontrolled.  Her B12 level was 230 in 2018.  She states that she is not taking any B12 supplementation.  I rechecked her B12 level and it is 239, which was barely normal.  With a prior low B12 I am confident that supplementing her is the best option and I am working with her home health agency to see if they can do monthly or weekly B12 shots for 2 or 3 shots and then oral B12 1000 mcg daily.  PLAN : IM B12 1000 mcg for 2 or 3 shots then oral B12 supplementation

## 2019-05-20 NOTE — Assessment & Plan Note (Signed)
This problem is chronic and uncontrolled.  He is on amlodipine 5 mg, metoprolol 25 daily, and Lasix 40 mg but only daily.  I got a telephone note afterwards but indicated she may not have her amlodipine and therefore may not be taking it.  Amlodipine is not on her medication list via epic as a medication she has picked up using her insurance that was on her most recent discharge summary.  Her home health agency is working on determining whether she has it or not.  PLAN:  Cont current meds Ensure she has amlodipine at home   BP Readings from Last 3 Encounters:  05/19/19 (!) 176/57  05/04/19 (!) 135/57  03/31/19 (!) 171/86

## 2019-05-20 NOTE — Assessment & Plan Note (Signed)
She remains on aspirin, Plavix, and atorvastatin 80 mg.

## 2019-05-20 NOTE — Assessment & Plan Note (Signed)
This problem is chronic and uncontrolled.  Her A1c trend is 6.6 - 8.6 today.  I did not get her A1c result back until after her visit was complete and we have many other issues to talk about today since I had not seen her in many many months.  I have verified her diabetic regimen which is insulin degludec and liraglutide combination 10 units daily.  I am seeing her back in 2 weeks and will adjust her regimen at that time.  PLAN : FU in 2 weeks

## 2019-05-24 ENCOUNTER — Encounter: Payer: Medicare Other | Admitting: Cardiology

## 2019-05-24 ENCOUNTER — Ambulatory Visit: Payer: Medicare Other

## 2019-05-24 DIAGNOSIS — I2510 Atherosclerotic heart disease of native coronary artery without angina pectoris: Secondary | ICD-10-CM

## 2019-05-24 DIAGNOSIS — I10 Essential (primary) hypertension: Secondary | ICD-10-CM

## 2019-05-24 NOTE — Progress Notes (Signed)
Internal Medicine Clinic Faculty & PCP  I have personally reviewed this encounter including the documentation in this note and/or discussed this patient with the care management provider. I will address any urgent items identified by the care management provider.  Larey Dresser, MD 05/24/2019

## 2019-05-24 NOTE — Progress Notes (Signed)
Internal Medicine Clinic Resident   I have personally reviewed this encounter including the documentation in this note and/or discussed this patient with the care management provider. I will address any urgent items identified by the care management provider and will communicate my actions to the patient's PCP. I have reviewed the patient's CCM visit with my supervising attending.  Neva Seat, MD 05/24/2019

## 2019-05-24 NOTE — Patient Instructions (Signed)
Visit Information  Goals Addressed   None     Ms. Ibrahim was given information about Care Management services today including:  1. Care Management services include personalized support from designated clinical staff supervised by her physician, including individualized plan of care and coordination with other care providers 2. 24/7 contact phone numbers for assistance for urgent and routine care needs. 3. The patient may stop CCM services at any time (effective at the end of the month) by phone call to the office staff.  Patient agreed to services and verbal consent obtained.   Patient verbalizes understanding of instructions provided today.   The patient has been provided with contact information for the care management team and has been advised to call with any health related questions or concerns.     Ronn Melena, [place] Coordination Social Worker [place] [phone]

## 2019-05-24 NOTE — Chronic Care Management (AMB) (Signed)
Chronic Care Management    Clinical Social Work General Note  05/24/2019 Name: [name] MRN: [id number] DOB: [birth year]  [name] is a 74 y.o. year old female who is a primary care patient of Audrey January Real Cons, MD. The CCM Social Worker was consulted to assist the patient with request for increase in Personal Care Service hours.   [name] was given information about Chronic Care Management services today including:  1. CCM service includes personalized support from designated clinical staff supervised by her physician, including individualized plan of care and coordination with other care providers 2. 24/7 contact phone numbers for assistance for urgent and routine care needs. 3. Service will only be billed when office clinical staff spend 20 minutes or more in a month to coordinate care. 4. Only one practitioner may furnish and bill the service in a calendar month. 5. The patient may stop CCM services at any time (effective at the end of the month) by phone call to the office staff. 6. The patient will be responsible for cost sharing (co-pay) of up to 20% of the service fee (after annual deductible is met).  Patient agreed to services and verbal consent obtained.   Review of patient status, including review of consultants reports, relevant laboratory and other test results, and collaboration with appropriate care team members and the patient's provider was performed as part of comprehensive patient evaluation and provision of chronic care management services.    Objective: Patient reports that she was just assessed/approved for [place] within the last few months.  Informed patient that she could be reassessed but approval of increase in hours is not likely unless there has been a change in need for assistance with ADL's since last assessment.  Patient declined another assessment at this time.   Social Determinants of Health screening was conducted and no  social work needs were identified at this time.       SDOH (Social Determinants of Health) assessments and interventions performed:  Yes    Outpatient Encounter Medications as of 05/24/2019  Medication Sig  . Accu-Chek FastClix Lancets MISC Use to check blood sugar 4 times a day. diag code E11.22. insulin dependent  . acetaminophen (TYLENOL) 500 MG tablet Take 2 tablets (1,000 mg total) by mouth every 6 (six) hours as needed.  Marland Kitchen amLODipine (NORVASC) 5 MG tablet Take 1 tablet (5 mg total) by mouth daily.  Marland Kitchen amoxicillin (AMOXIL) 500 MG tablet Take 4 capsules (2,000 mg) one hour prior to all dental visits.  Marland Kitchen aspirin 81 MG EC tablet TAKE 1 TABLET (81 MG TOTAL) BY MOUTH DAILY. SWALLOW WHOLE. (Patient taking differently: Take 81 mg by mouth daily. Swallow whole)  . atorvastatin (LIPITOR) 80 MG tablet TAKE 1 TABLET BY MOUTH AT BEDTIME  . clopidogrel (PLAVIX) 75 MG tablet Take 1 tablet (75 mg total) by mouth daily with breakfast.  . EASY TOUCH PEN NEEDLES 31G X 5 MM MISC USE AS DIRECTED FOUR TIMES A DAY  . furosemide (LASIX) 40 MG tablet TAKE 1 TABLET (40 MG TOTAL) BY MOUTH DAILY.  . furosemide (LASIX) 40 MG tablet Take 1 tablet (40 mg total) by mouth daily as needed for edema (For weight above dry weight).  Marland Kitchen glucose blood (CONTOUR NEXT TEST) test strip Use to check blood sugar 4 times daily. diag code E11.22. insulin dependent  . Insulin Degludec-Liraglutide (XULTOPHY) 100-3.6 UNIT-MG/ML SOPN Inject 10 Units into the skin daily.  . metoprolol succinate (TOPROL-XL) 25 MG 24 hr  tablet Take 1 tablet (25 mg total) by mouth daily.  Marland Kitchen PARoxetine (PAXIL) 30 MG tablet TAKE 1 TABLET BY MOUTH DAILY (Patient taking differently: Take 30 mg by mouth daily. )  . polyethylene glycol (MIRALAX / GLYCOLAX) packet Take 17 g by mouth daily. (Patient taking differently: Take 17 g by mouth daily as needed for mild constipation. )   No facility-administered encounter medications on file as of 05/24/2019.    Goals  Addressed   None      Follow up plan:  Patient will be contacted by [facility], Kelli Churn for initial nursing assessment.  Provided patient with my contact information and encouraged her to call if need for social work support arises.       Ronn Melena, [place] Coordination Social Worker [place] [phone]

## 2019-05-25 ENCOUNTER — Telehealth: Payer: Self-pay | Admitting: *Deleted

## 2019-05-25 ENCOUNTER — Other Ambulatory Visit: Payer: Self-pay | Admitting: Internal Medicine

## 2019-05-25 MED ORDER — CYANOCOBALAMIN 1000 MCG/ML IJ SOLN: 1000.0000 ug | INTRAMUSCULAR | 0 refills | Status: DC

## 2019-05-25 NOTE — Telephone Encounter (Addendum)
Spoke with Lexine Baton at [facility]. Relayed orders for [employer] and Vit B12 injections. Lexine Baton states they will wash LEs with soap and water, apply Publix, wrap with Kerlix, then apply compression [place]an. Once edema is managed they will transition patient to compression stockings.  They will administer Vit B12 1000 mcg IM weekly x 4 weeks. They will need Rx for Vit B12 sent to patient's pharmacy and family will need to pick this up prior to administration. Call placed to patient's daughter, [name]. No answer. Left message on VM requesting return call. Hubbard Hartshorn, BSN, RN-BC

## 2019-05-25 NOTE — Telephone Encounter (Signed)
-----   Message from Marcelino Duster, [place] sent at 05/25/2019 10:12 AM EDT ----- Ander Purpura,  Is this something you can help with? ----- Message ----- From: [name], MD Sent: 05/20/2019   7:58 AM EDT To: Marcelino Duster, [place] She [facility].  How do I get home health to put on Unna boots? And I also need home health in to administer B12 shots.  They can do monthly for 2 to 4 months or weekly for 2 to 4 weeks.  I would then transition her over to oral B12.  Thanks

## 2019-05-25 NOTE — Telephone Encounter (Signed)
B12 sent to [facility] mail order. Is that OK?

## 2019-05-26 ENCOUNTER — Ambulatory Visit: Payer: Self-pay | Admitting: *Deleted

## 2019-05-26 DIAGNOSIS — I130 Hypertensive heart and chronic kidney disease with heart failure and stage 1 through stage 4 chronic kidney disease, or unspecified chronic kidney disease: Secondary | ICD-10-CM

## 2019-05-26 DIAGNOSIS — I10 Essential (primary) hypertension: Secondary | ICD-10-CM

## 2019-05-26 NOTE — Chronic Care Management (AMB) (Signed)
  Chronic Care Management   Outreach Note  05/26/2019 Name: [name] MRN: [id number] DOB: [birth year]  Referred by: [name], MD Reason for referral : Chronic Care Management (HF, CAD)   An unsuccessful telephone outreach was attempted today. The patient was referred to the case management team for assistance with care management and care coordination. HIPAA compliant voice message was left on patient's contact number with request to return call to this CCM RN.  Follow Up Plan: The care management team will reach out to the patient again over the next 7 days.   Kelli Churn RN, CCM, [facility] RN Care Manager [phone]

## 2019-06-01 NOTE — Telephone Encounter (Signed)
Call placed to [facility] at [facility] to see if patient received Vit B12 from [facility]. Ailene Ravel will contact [place] RN Olivia Mackie) who has visit scheduled later today and call us back. Hubbard Hartshorn, BSN, RN-BC

## 2019-06-02 ENCOUNTER — Ambulatory Visit: Payer: Medicare Other | Admitting: *Deleted

## 2019-06-02 ENCOUNTER — Ambulatory Visit: Payer: Medicare Other | Admitting: Internal Medicine

## 2019-06-02 DIAGNOSIS — E1122 Type 2 diabetes mellitus with diabetic chronic kidney disease: Secondary | ICD-10-CM

## 2019-06-02 DIAGNOSIS — I2510 Atherosclerotic heart disease of native coronary artery without angina pectoris: Secondary | ICD-10-CM

## 2019-06-02 DIAGNOSIS — I10 Essential (primary) hypertension: Secondary | ICD-10-CM

## 2019-06-02 DIAGNOSIS — N182 Chronic kidney disease, stage 2 (mild): Secondary | ICD-10-CM

## 2019-06-02 DIAGNOSIS — I5032 Chronic diastolic (congestive) heart failure: Secondary | ICD-10-CM

## 2019-06-02 NOTE — Chronic Care Management (AMB) (Addendum)
Chronic Care Management   Initial Visit Note  06/02/2019 Name: [name] MRN: [id number] DOB: 07-12-1945  Referred by: [name], MD Reason for referral : Chronic Care Management (HTN, DM, CAD, HF)   [name] is a 74 y.o. year old female who is a primary care patient of [name], MD. The CCM team was consulted for assistance with chronic disease management and care coordination needs related to CHF, CAD, HTN and DMII  Review of patient status, including review of consultants reports, relevant laboratory and other test results, and collaboration with appropriate care team members and the patient's provider was performed as part of comprehensive patient evaluation and provision of chronic care management services.    SDOH (Social Determinants of Health) assessments performed: No See Care Plan activities for detailed interventions related to SDOH     Medications: Outpatient Encounter Medications as of 06/02/2019  Medication Sig  . Accu-Chek FastClix Lancets MISC Use to check blood sugar 4 times a day. diag code E11.22. insulin dependent  . acetaminophen (TYLENOL) 500 MG tablet Take 2 tablets (1,000 mg total) by mouth every 6 (six) hours as needed.  Marland Kitchen amLODipine (NORVASC) 5 MG tablet Take 1 tablet (5 mg total) by mouth daily.  Marland Kitchen amoxicillin (AMOXIL) 500 MG tablet Take 4 capsules (2,000 mg) one hour prior to all dental visits.  Marland Kitchen aspirin 81 MG EC tablet TAKE 1 TABLET (81 MG TOTAL) BY MOUTH DAILY. SWALLOW WHOLE. (Patient taking differently: Take 81 mg by mouth daily. Swallow whole)  . atorvastatin (LIPITOR) 80 MG tablet TAKE 1 TABLET BY MOUTH AT BEDTIME  . clopidogrel (PLAVIX) 75 MG tablet Take 1 tablet (75 mg total) by mouth daily with breakfast.  . cyanocobalamin (,VITAMIN B-12,) 1000 MCG/ML injection Inject 1 mL (1,000 mcg total) into the muscle once a week. For 4 weeks  . EASY TOUCH PEN NEEDLES 31G X 5 MM MISC USE AS DIRECTED FOUR TIMES A DAY  .  furosemide (LASIX) 40 MG tablet TAKE 1 TABLET (40 MG TOTAL) BY MOUTH DAILY.  . furosemide (LASIX) 40 MG tablet Take 1 tablet (40 mg total) by mouth daily as needed for edema (For weight above dry weight).  Marland Kitchen glucose blood (CONTOUR NEXT TEST) test strip Use to check blood sugar 4 times daily. diag code E11.22. insulin dependent  . Insulin Degludec-Liraglutide (XULTOPHY) 100-3.6 UNIT-MG/ML SOPN Inject 10 Units into the skin daily.  . metoprolol succinate (TOPROL-XL) 25 MG 24 hr tablet Take 1 tablet (25 mg total) by mouth daily.  Marland Kitchen PARoxetine (PAXIL) 30 MG tablet TAKE 1 TABLET BY MOUTH DAILY (Patient taking differently: Take 30 mg by mouth daily. )  . polyethylene glycol (MIRALAX / GLYCOLAX) packet Take 17 g by mouth daily. (Patient taking differently: Take 17 g by mouth daily as needed for mild constipation. )   No facility-administered encounter medications on file as of 06/02/2019.     Objective:  BP Readings from Last 3 Encounters:  05/19/19 (!) 176/57  05/04/19 (!) 135/57  03/31/19 (!) 171/86   Lab Results  Component Value Date   HGBA1C 8.6 (A) 05/19/2019   HGBA1C 6.6 (H) 02/24/2019   HGBA1C 6.8 (H) 02/07/2019   Lab Results  Component Value Date   MICROALBUR 50.9 (H) 06/07/2014 please update   LDLCALC 50 02/07/2019   CREATININE 1.51 (H) 05/19/2019    Echocardiogram of 04/22/19 1. Left ventricular ejection fraction, by estimation, is 65 to 70%. The left ventricle has hyperdynamic function. The left ventricle  has no regional wall motion abnormalities. There is severe concentric left ventricular hypertrophy. Left ventricular diastolic parameters are consistent with Grade I diastolic dysfunction (impaired relaxation). Elevated left ventricular end-diastolic pressure. 2. Right ventricular systolic function is normal   Goals Addressed            This Visit's Progress     Patient Stated   . "I can weigh myself without help and I write it down but I don't keep the paper with the  weights on it after a while." (pt-stated)       CARE PLAN ENTRY (see longitudinal plan of care for additional care plan information)  Current Barriers:  . Chronic Disease Management support, education, and care coordination needs related to CHF, CAD, HTN, DMII, and CKD Stage 3 - patient states she is able to weight herself and record the weight each morning without assistance but does not keep the recordings of her weight after a certain period of time, she was agreeable to using the [place] [facility] spiral bound calender this CCM RN will mail to her, she says she checks her blood sugars each morning but when asked she could not recall any recent readings and was not able to correctly name the fasting blood sugar target or her most recent Hgb A1C but she correctly stated the Hgb A1C target of <7.0%,  she could not identify the possible cause for the 2 point increase in her Hgb A1C from 02/24/19 to 05/19/19. Says she does not monitor her blood pressure at home because she does not have a home monitor but states she would check it if she could get a monitor at no out of pocket cost  Clinical Goal(s) related to CHF, CAD, HTN, DMII, and CKD Stage 3 :  Over the next 30 days, patient will:  . Work with the care management team to address educational, disease management, and care coordination needs  . Begin or continue self health monitoring activities as directed today Measure and record weight daily . Call provider office for new or worsened signs and symptoms Weight outside established parameters . Call care management team with questions or concerns . Verbalize basic understanding of patient centered plan of care established today  Interventions related to CHF, CAD, HTN, DMII, and CKD Stage 3 :  . Evaluation of current treatment plans and patient's adherence to plan as established by provider . Assessed patient understanding of disease states . Assessed patient's education and  care coordination needs . Provided disease specific education to patient - mailed patient [place] [facility] spiral bound calendar with stop lights and action plans for HTN, DM, and HF and Emmi education articles : Heart Failure Keeping Track Of Your Weight; The ABCs of Diabetes; and Diabetes : Why Get Your A1C Checked . Will investigate obtaining a home BP monitor for patient . Collaborated with appropriate clinical care team members regarding patient needs  Patient Self Care Activities related to CHF, CAD, HTN, DMII, and CKD Stage 3 :  . Patient is unable to independently self-manage chronic health conditions  Initial goal documentation       Other   . Blood Pressure < 130/80       BP Readings from Last 3 Encounters:  05/19/19 (!) 176/57  05/04/19 (!) 135/57  03/31/19 (!) 171/86       . HEMOGLOBIN A1C < 7.5       Lab Results  Component Value Date   HGBA1C 8.6 (A)  05/19/2019       . LDL CALC < 100       Lab Results  Component Value Date   CHOL 99 02/07/2019   HDL 26 (L) 02/07/2019   LDLCALC 50 02/07/2019   TRIG 117 02/07/2019   CHOLHDL 3.8 02/07/2019            Ms. Bahner was given information about Chronic Care Management services today including:  1. CCM service includes personalized support from designated clinical staff supervised by her physician, including individualized plan of care and coordination with other care providers 2. 24/7 contact phone numbers for assistance for urgent and routine care needs. 3. Service will only be billed when office clinical staff spend 20 minutes or more in a month to coordinate care. 4. Only one practitioner may furnish and bill the service in a calendar month. 5. The patient may stop CCM services at any time (effective at the end of the month) by phone call to the office staff. 6. The patient will be responsible for cost sharing (co-pay) of up to 20% of the service fee (after annual deductible is  met).  Patient agreed to services and verbal consent obtained.   Plan:   The care management team will reach out to the patient again over the next 30 days.   Kelli Churn RN, CCM, [facility] RN Care Manager [phone]

## 2019-06-02 NOTE — Patient Instructions (Signed)
Visit Information It was nice speaking with you today. Please review the educational material that was mailed to you and we will discuss on next month's call. Please write your daily weights in the spiral bound calender that was mailed to you and bring the calender to each appointment with your health care providers.   Goals Addressed            This Visit's Progress     Patient Stated   . "I can weigh myself without help and I write it down but I don't keep the paper with the weights on it after a while." (pt-stated)       CARE PLAN ENTRY (see longitudinal plan of care for additional care plan information)  Current Barriers:  . Chronic Disease Management support, education, and care coordination needs related to CHF, CAD, HTN, DMII, and CKD Stage 3 - patient states she is able to weight herself and record the weight each morning without assistance but does not keep the recordings of her weight after a certain period of time, she was agreeable to using the [place] [facility] spiral bound calender this CCM RN will mail to her, she says she checks her blood sugars each morning but when asked she could not recall any recent readings and was not able to correctly name the fasting blood sugar target or her most recent Hgb A1C but she correctly stated the Hgb A1C target of <7.0%,  she could not identify the possible cause for the 2 point increase in her Hgb A1C from 02/24/19 to 05/19/19. Says she does not monitor her blood pressure at home because she does not have a home monitor but states she would check it if she could get a monitor at no out of pocket cost  Clinical Goal(s) related to CHF, CAD, HTN, DMII, and CKD Stage 3 :  Over the next 30 days, patient will:  . Work with the care management team to address educational, disease management, and care coordination needs  . Begin or continue self health monitoring activities as directed today Measure and record weight  daily . Call provider office for new or worsened signs and symptoms Weight outside established parameters . Call care management team with questions or concerns . Verbalize basic understanding of patient centered plan of care established today  Interventions related to CHF, CAD, HTN, DMII, and CKD Stage 3 :  . Evaluation of current treatment plans and patient's adherence to plan as established by provider . Assessed patient understanding of disease states . Assessed patient's education and care coordination needs . Provided disease specific education to patient - mailed patient [place] [facility] spiral bound calendar with stop lights and action plans for HTN, DM, and HF and Emmi education articles : Heart Failure Keeping Track Of Your Weight; The ABCs of Diabetes; and Diabetes : Why Get Your A1C Checked . Will investigate obtaining a home BP monitor for patient . Collaborated with appropriate clinical care team members regarding patient needs  Patient Self Care Activities related to CHF, CAD, HTN, DMII, and CKD Stage 3 :  . Patient is unable to independently self-manage chronic health conditions  Initial goal documentation       Other   . Blood Pressure < 130/80       BP Readings from Last 3 Encounters:  05/19/19 (!) 176/57  05/04/19 (!) 135/57  03/31/19 (!) 171/86       . HEMOGLOBIN A1C < 7.5  Lab Results  Component Value Date   HGBA1C 8.6 (A) 05/19/2019       . LDL CALC < 100       Lab Results  Component Value Date   CHOL 99 02/07/2019   HDL 26 (L) 02/07/2019   LDLCALC 50 02/07/2019   TRIG 117 02/07/2019   CHOLHDL 3.8 02/07/2019          [name] was given information about Chronic Care Management services today including:  1. CCM service includes personalized support from designated clinical staff supervised by her physician, including individualized plan of care and coordination with other care providers 2. 24/7 contact phone numbers  for assistance for urgent and routine care needs. 3. Service will only be billed when office clinical staff spend 20 minutes or more in a month to coordinate care. 4. Only one practitioner may furnish and bill the service in a calendar month. 5. The patient may stop CCM services at any time (effective at the end of the month) by phone call to the office staff. 6. The patient will be responsible for cost sharing (co-pay) of up to 20% of the service fee (after annual deductible is met).  Patient agreed to services and verbal consent obtained.   The patient verbalized understanding of instructions provided today and declined a print copy of patient instruction materials.   The care management team will reach out to the patient again over the next 30 days.   Kelli Churn RN, CCM, [facility] RN Care Manager [phone]

## 2019-06-03 NOTE — Progress Notes (Signed)
Internal Medicine Clinic Resident   I have personally reviewed this encounter including the documentation in this note and/or discussed this patient with the care management provider. I will address any urgent items identified by the care management provider and will communicate my actions to the patient's PCP. I have reviewed the patient's CCM visit with my supervising attending.  Aryaman Haliburton D Haylei Cobin, DO 06/03/2019    

## 2019-06-09 NOTE — Progress Notes (Signed)
Internal Medicine Clinic Attending  CCM services provided by the care management provider and their documentation were discussed with Dr. Koleen Distance. We reviewed the pertinent findings, urgent action items addressed by the resident and non-urgent items to be addressed by the PCP.  I agree with the assessment, diagnosis, and plan of care documented in the CCM and resident's note.  Gilles Chiquito, MD 06/09/2019

## 2019-06-10 NOTE — Telephone Encounter (Signed)
Meredith at [facility] called to say she contacted [facility] and Vit B12 is not covered by insurance and patient would need to pay out of pocket. Confirmed with Merrilee Seashore at [facility] that this is non-formulary and cannot even go through with PA. Out of pocket cost to patient would be $30.77. He can not say what would be formulary. Call placed to daughter, [name]. No answer. Left message on VM requesting return call. Hubbard Hartshorn, BSN, RN-BC

## 2019-06-10 NOTE — Addendum Note (Signed)
Addended by: Hulan Fray on: 06/10/2019 07:05 AM   Modules accepted: Orders

## 2019-06-13 NOTE — Telephone Encounter (Signed)
Pls tell her to take OTC Vit B12 1000 mcg PO QD Thanks

## 2019-06-13 NOTE — Telephone Encounter (Signed)
Information below relayed to patient. She repeated name of med and mcg amount. Hubbard Hartshorn, BSN, RN-BC

## 2019-06-16 ENCOUNTER — Other Ambulatory Visit: Payer: Self-pay

## 2019-06-16 ENCOUNTER — Ambulatory Visit (INDEPENDENT_AMBULATORY_CARE_PROVIDER_SITE_OTHER): Payer: Medicare Other | Admitting: Internal Medicine

## 2019-06-16 DIAGNOSIS — R011 Cardiac murmur, unspecified: Secondary | ICD-10-CM

## 2019-06-16 DIAGNOSIS — I5033 Acute on chronic diastolic (congestive) heart failure: Secondary | ICD-10-CM

## 2019-06-16 DIAGNOSIS — N183 Chronic kidney disease, stage 3 unspecified: Secondary | ICD-10-CM

## 2019-06-16 DIAGNOSIS — Z79899 Other long term (current) drug therapy: Secondary | ICD-10-CM | POA: Diagnosis not present

## 2019-06-16 DIAGNOSIS — Z794 Long term (current) use of insulin: Secondary | ICD-10-CM

## 2019-06-16 DIAGNOSIS — I5032 Chronic diastolic (congestive) heart failure: Secondary | ICD-10-CM

## 2019-06-16 DIAGNOSIS — I130 Hypertensive heart and chronic kidney disease with heart failure and stage 1 through stage 4 chronic kidney disease, or unspecified chronic kidney disease: Secondary | ICD-10-CM

## 2019-06-16 DIAGNOSIS — Z7982 Long term (current) use of aspirin: Secondary | ICD-10-CM

## 2019-06-16 DIAGNOSIS — N1832 Chronic kidney disease, stage 3b: Secondary | ICD-10-CM

## 2019-06-16 DIAGNOSIS — E1122 Type 2 diabetes mellitus with diabetic chronic kidney disease: Secondary | ICD-10-CM | POA: Diagnosis not present

## 2019-06-16 DIAGNOSIS — Z952 Presence of prosthetic heart valve: Secondary | ICD-10-CM

## 2019-06-16 DIAGNOSIS — I10 Essential (primary) hypertension: Secondary | ICD-10-CM

## 2019-06-16 MED ORDER — DICLOFENAC SODIUM 1 % EX GEL: 2.0000 g | Freq: Four times a day (QID) | CUTANEOUS | 1 refills | Status: DC

## 2019-06-16 MED ORDER — CLOPIDOGREL BISULFATE 75 MG PO TABS: 75.0000 mg | ORAL_TABLET | Freq: Every day | ORAL | 3 refills | Status: DC

## 2019-06-16 MED ORDER — HYDRALAZINE HCL 50 MG PO TABS: 50.0000 mg | ORAL_TABLET | Freq: Three times a day (TID) | ORAL | 0 refills | Status: DC

## 2019-06-16 NOTE — Assessment & Plan Note (Addendum)
This problem is chronic and uncontrolled.  Her A1c at her last appointment in March was 8.6, up from 6.6.  She is on degludec and liraglutide 10 units once a day.  She denies any hypoglycemia.  As if she is unstable today, I will not adjust her insulin but will have her follow-up closely to increase her degludec and liraglutide.  PLAN : F/U next appt

## 2019-06-16 NOTE — Patient Instructions (Signed)
1.  You have too much fluid in your body.  We are trying to arrange help at home to give you medicine to cool off the fluid.  2.  We will need to talk about your diabetes at your next appointment or over the phone.  3.  Please try to get vitamin B12 1000 mcg and take 1 pill by mouth once a day.  4.  I will send in a cream to use on your left hand for the sore knuckles  5.  I will refill the 2 medicines that you requested.

## 2019-06-16 NOTE — Assessment & Plan Note (Addendum)
This problem has worsened and she has a severe exacerbation of her chronic grade 1 diastolic dysfunction as seen on her most recent echo this year.  Her weight at her most recent discharge after her TAVR was 164 pounds.  Today, she states her weight has been fluctuating between 160 and 163 at home and today her weight was 171 at home.  Per the clinic scales, 163 with her lowest weight on February 4 and currently she is 171.  She states that she did have bilateral compression stockings fitted yesterday but that those would not account for all of the weight gain.  Additionally, at her appointment 1 month ago, she did not have any edema proximal to her knees.  Today, she has pitting edema in the posterior thighs bilaterally.  She also has some crackles in the bases, left greater than right.  Her systolic blood pressure is significantly elevated today to 215.  She is asymptomatic with no dyspnea, dyspnea on exertion, orthopnea.  However, she states her lower extremity edema was getting worse in a week or 2 prior to the compression stockings.  She currently is taking Lasix 40 mg once a day.  At her March appointment, Dr. [name] requested that she increase her Lasix to twice daily but she was unable to comply with this request.  She has stage III CKD with a recent AKI and May 2022 with a creatinine of 2.94.  Also, her potassium is low normal.  I am concerned about her ability to manage increased diuresis at home and lack of ability to monitor renal function and potassium if trying to do outpatient diuresis.  She needs criteria for inpatient diuresis and inpatient diuresis with close monitoring would be beneficial.  I am going to see if she qualifies for our hospital at home diuretic program to prevent an admission.  PLAN: Consult hospital at home for IV diuresis to see if she qualifies

## 2019-06-16 NOTE — Progress Notes (Signed)
   Subjective:    Patient ID: [name], female    DOB: 08-Feb-1946, 74 y.o.   MRN: [id number]  HPI  [name] is here for dia HF F/U. Please see the A&P for the status of the pt's chronic medical problems.  ROS : per ROS section and in problem oriented charting. All other systems are negative.  PMHx, Soc hx, and / or Fam hx : She lives alone.  She has Medicaid that comes 3 times a week to help her shower, cook, and tidy up.  Physical therapy is still coming.  She ambulates with a walker.  She has had 2 falls since I last saw her.  Review of Systems Negative for cough, dyspnea, dyspnea on exertion, orthopnea, chest pain, sleep issues  Positive for chest pain, dizziness, lightheadedness, lower extremity edema    Objective:   Physical Exam Constitutional:      Appearance: Normal appearance.  HENT:     Head: Normocephalic and atraumatic.     Right Ear: External ear normal.     Left Ear: External ear normal.  Eyes:     General:        Right eye: No discharge.        Left eye: No discharge.     Extraocular Movements: Extraocular movements intact.     Conjunctiva/sclera: Conjunctivae normal.  Cardiovascular:     Rate and Rhythm: Normal rate and regular rhythm.     Heart sounds: Murmur present.     Comments: Compression stockings bilaterally to knees.  Pitting edema to posterior thighs bilaterally.  No edema upper extremities Pulmonary:     Effort: Pulmonary effort is normal.     Breath sounds: Rhonchi present.     Comments: She has rhonchi in the bases left greater than right Musculoskeletal:        General: No deformity. Normal range of motion.  Skin:    General: Skin is warm and dry.  Neurological:     General: No focal deficit present.     Mental Status: She is alert. Mental status is at baseline.  Psychiatric:        Mood and Affect: Mood normal.        Behavior: Behavior normal.        Thought Content: Thought content normal.        Judgment: Judgment  normal.           Assessment & Plan:  Billing notes Her chronic diastolic heart failure is a chronic illness with a severe exacerbation.  She meets criteria for hospitalization but due to our hospital at home program, I was able to avoid hospitalization and use IV diuresis at home.  I reviewed her creatinine, vitamin B12 level, and prior echo.  Her daughter was present to function as an independent historian.  I discussed her case with Dr. Tarri Abernethy who was functioning as a representative of the hospital at home program which is independent of the internal medicine Center.

## 2019-06-16 NOTE — Assessment & Plan Note (Signed)
This problem is chronic with a severe exacerbation.  Systolic blood pressure today is 215.  She is asymptomatic other than volume overload.  She does not know her blood pressure medicine.  She has a bottle of hydralazine 50 TID that has 3 pills left.  She states that she is taking it once a day.  This medicine was removed from her med list during her December 2020 hospitalization.  No reason was given.  She is taking her furosemide 40.  There were telephone notes about the confusion regarding her amlodipine 5 mg since her last appointment so I do not know if she is taking it.  Metoprolol 25 is also on her medication list.  I will get home health or the hospital at home program to do a med rec as it is hard to know what to adjust without knowing what she is taking.  I did refill the hydralazine and instruct her to take it 3 times daily as diuresis will only lower her blood pressure still not and she did have elevated blood pressure check at her most recent appointment.  PLAN : Cont Hydral 50 TID Amlodipine 5 QD Lasix inc dosing for diuresis Metoprolol 25 QD

## 2019-06-17 ENCOUNTER — Telehealth: Payer: Medicare Other

## 2019-06-17 ENCOUNTER — Ambulatory Visit: Payer: Medicare Other | Admitting: *Deleted

## 2019-06-17 DIAGNOSIS — I10 Essential (primary) hypertension: Secondary | ICD-10-CM

## 2019-06-17 DIAGNOSIS — N183 Chronic kidney disease, stage 3 unspecified: Secondary | ICD-10-CM

## 2019-06-17 DIAGNOSIS — I130 Hypertensive heart and chronic kidney disease with heart failure and stage 1 through stage 4 chronic kidney disease, or unspecified chronic kidney disease: Secondary | ICD-10-CM

## 2019-06-17 DIAGNOSIS — Z794 Long term (current) use of insulin: Secondary | ICD-10-CM

## 2019-06-17 DIAGNOSIS — E1122 Type 2 diabetes mellitus with diabetic chronic kidney disease: Secondary | ICD-10-CM

## 2019-06-17 DIAGNOSIS — N1832 Chronic kidney disease, stage 3b: Secondary | ICD-10-CM

## 2019-06-17 NOTE — Chronic Care Management (AMB) (Signed)
Chronic Care Management   Follow Up Note   06/17/2019 Name: [name] MRN: [id number] DOB: [birth year]  Referred by: [name], MD Reason for referral : Chronic Care Management (HTN, HF)   [name] is a 74 y.o. year old female who is a primary care patient of [name], MD. The CCM team was consulted for assistance with chronic disease management and care coordination needs.    Review of patient status, including review of consultants reports, relevant laboratory and other test results, and collaboration with appropriate care team members and the patient's provider was performed as part of comprehensive patient evaluation and provision of chronic care management services.    SDOH (Social Determinants of Health) assessments performed: No See Care Plan activities for detailed interventions related to [facility])     Outpatient Encounter Medications as of 06/17/2019  Medication Sig  . Accu-Chek FastClix Lancets MISC Use to check blood sugar 4 times a day. diag code E11.22. insulin dependent  . acetaminophen (TYLENOL) 500 MG tablet Take 2 tablets (1,000 mg total) by mouth every 6 (six) hours as needed.  Marland Kitchen amLODipine (NORVASC) 5 MG tablet Take 1 tablet (5 mg total) by mouth daily.  Marland Kitchen amoxicillin (AMOXIL) 500 MG tablet Take 4 capsules (2,000 mg) one hour prior to all dental visits.  Marland Kitchen aspirin 81 MG EC tablet TAKE 1 TABLET (81 MG TOTAL) BY MOUTH DAILY. SWALLOW WHOLE. (Patient taking differently: Take 81 mg by mouth daily. Swallow whole)  . atorvastatin (LIPITOR) 80 MG tablet TAKE 1 TABLET BY MOUTH AT BEDTIME  . clopidogrel (PLAVIX) 75 MG tablet Take 1 tablet (75 mg total) by mouth daily with breakfast.  . cyanocobalamin (,VITAMIN B-12,) 1000 MCG/ML injection Inject 1 mL (1,000 mcg total) into the muscle once a week. For 4 weeks  . diclofenac Sodium (VOLTAREN) 1 % GEL Apply 2 g topically 4 (four) times daily.  Marland Kitchen EASY TOUCH PEN NEEDLES 31G X 5 MM MISC USE AS DIRECTED  FOUR TIMES A DAY  . furosemide (LASIX) 40 MG tablet TAKE 1 TABLET (40 MG TOTAL) BY MOUTH DAILY.  . furosemide (LASIX) 40 MG tablet Take 1 tablet (40 mg total) by mouth daily as needed for edema (For weight above dry weight).  Marland Kitchen glucose blood (CONTOUR NEXT TEST) test strip Use to check blood sugar 4 times daily. diag code E11.22. insulin dependent  . hydrALAZINE (APRESOLINE) 50 MG tablet Take 1 tablet (50 mg total) by mouth 3 (three) times daily.  . Insulin Degludec-Liraglutide (XULTOPHY) 100-3.6 UNIT-MG/ML SOPN Inject 10 Units into the skin daily.  . metoprolol succinate (TOPROL-XL) 25 MG 24 hr tablet Take 1 tablet (25 mg total) by mouth daily.  Marland Kitchen PARoxetine (PAXIL) 30 MG tablet TAKE 1 TABLET BY MOUTH DAILY (Patient taking differently: Take 30 mg by mouth daily. )  . polyethylene glycol (MIRALAX / GLYCOLAX) packet Take 17 g by mouth daily. (Patient taking differently: Take 17 g by mouth daily as needed for mild constipation. )   No facility-administered encounter medications on file as of 06/17/2019.     Objective:  Wt Readings from Last 3 Encounters:  06/16/19 171 lb 9.6 oz (77.8 kg)  05/19/19 168 lb 14.4 oz (76.6 kg)  05/04/19 166 lb 1.6 oz (75.3 kg)   BP Readings from Last 3 Encounters:  06/16/19 (!) 215/88  05/19/19 (!) 176/57  05/04/19 (!) 135/57   Goals Addressed            This Visit's Progress  Patient Stated   . "I can weigh myself without help and I write it down but I don't keep the paper with the weights on it after a while." (pt-stated)       CARE PLAN ENTRY (see longitudinal plan of care for additional care plan information)  Current Barriers:  Chronic Disease Management support, education, and care coordination needs related to CHF, CAD, HTN, DMII, and CKD Stage 3 - patient states she received the educational information and the [place] Management calendar with BP and weight log sheets and action plan information but says she hasn't read the  material or used the calendar yet. Patient says her [place] nurse is present so spoke with [facility] with [facility] and she advised this CCM RN that patient was enrolled in their heart failure program last evening 06/16/19 and has been provided with a scale and blood pressure monitor and that she will be closely monitored for the next few days since patient required diuresing for volume overload when she was seen by her primary care provider on 06/16/19. Asencion Partridge says patient took her vitals independently as instructed yesterday and her weight was down 3 lbs this morning   Clinical Goal(s) related to CHF, CAD, HTN, DMII, and CKD Stage 3 :  Over the next 30 days, patient will:  . Work with the care management team to address educational, disease management, and care coordination needs  . Begin or continue self health monitoring activities as directed today Measure and record weight daily . Call provider office for new or worsened signs and symptoms Weight outside established parameters . Call care management team with questions or concerns . Verbalize basic understanding of patient centered plan of care established today  Interventions related to CHF, CAD, HTN, DMII, and CKD Stage 3 :  . Evaluation of current treatment plans and patient's adherence to plan as established by provider . Collaborated with appropriate clinical care team members regarding patient needs . Provided [facility] RN Asencion Partridge with this CCM RN's contact number and requested notification when [facility] is no longer monitoring patient so this CCM RN can re-engage with patient and secure home BP monitor  Patient Self Care Activities related to CHF, CAD, HTN, DMII, and CKD Stage 3 :  . Patient is unable to independently self-manage chronic health conditions  Please see past updates related to this goal by clicking on the "Past Updates" button in the selected goal          Plan:   The care management team will  reach out to the patient again over the next 7-14 days days.   Kelli Churn RN, CCM, [facility] RN Care Manager [phone]

## 2019-06-17 NOTE — Progress Notes (Signed)
Internal Medicine Clinic Resident   I have personally reviewed this encounter including the documentation in this note and/or discussed this patient with the care management provider. I will address any urgent items identified by the care management provider and will communicate my actions to the patient's PCP. I have reviewed the patient's CCM visit with my supervising attending.  Maudie Mercury, MD 06/17/2019

## 2019-06-17 NOTE — Patient Instructions (Signed)
Visit Information It was nice speaking with you and your [facility] nurse [place] today. Keep up the good work in measuring your blood pressure and weight each morning.  I will call you for a follow up assessment when [facility] is no longer monitoring your heart failure.  Goals Addressed            This Visit's Progress     Patient Stated   . "I can weigh myself without help and I write it down but I don't keep the paper with the weights on it after a while." (pt-stated)       CARE PLAN ENTRY (see longitudinal plan of care for additional care plan information)  Current Barriers:  . Chronic Disease Management support, education, and care coordination needs related to CHF, CAD, HTN, DMII, and CKD Stage 3 - patient states she received the educational information and the [place] Management calendar with BP and weight log sheets and action plan information but says she hasn't read the material or used the calendar yet. Patient says her [place] nurse is present so spoke with [facility] with [facility] and she advised this CCM RN that patient was enrolled in their heart failure program last evening 06/16/19 and has been provided with a scale and blood pressure monitor and that she will be closely monitored for the next few days since patient required diuresing for volume overload when she was seen by her primary care provider on 06/16/19. Asencion Partridge says patient took her vitals independently as instructed yesterday and her weight was down 3 lbs this morning  Clinical Goal(s) related to CHF, CAD, HTN, DMII, and CKD Stage 3 :  Over the next 30 days, patient will:  . Work with the care management team to address educational, disease management, and care coordination needs  . Begin or continue self health monitoring activities as directed today Measure and record weight daily . Call provider office for new or worsened signs and symptoms Weight outside established  parameters . Call care management team with questions or concerns . Verbalize basic understanding of patient centered plan of care established today  Interventions related to CHF, CAD, HTN, DMII, and CKD Stage 3 :  . Evaluation of current treatment plans and patient's adherence to plan as established by provider . Collaborated with appropriate clinical care team members regarding patient needs . Provided [facility] RN Asencion Partridge with this CCM RN's contact number and requested notification when [facility] is no longer monitoring patient  so this CCM RN can re-engage with patient and secure home BP monitor  Patient Self Care Activities related to CHF, CAD, HTN, DMII, and CKD Stage 3 :  . Patient is unable to independently self-manage chronic health conditions  Please see past updates related to this goal by clicking on the "Past Updates" button in the selected goal         The patient verbalized understanding of instructions provided today and declined a print copy of patient instruction materials.   The care management team will reach out to the patient again over the next 7-14 days.   Kelli Churn RN, CCM, [facility] RN Care Manager [phone]

## 2019-06-20 ENCOUNTER — Other Ambulatory Visit: Payer: Self-pay

## 2019-06-20 ENCOUNTER — Ambulatory Visit (INDEPENDENT_AMBULATORY_CARE_PROVIDER_SITE_OTHER): Payer: Medicare Other | Admitting: Cardiology

## 2019-06-20 ENCOUNTER — Encounter: Payer: Self-pay | Admitting: Cardiology

## 2019-06-20 VITALS — BP 128/74 | HR 73 | Ht 66.0 in | Wt 173.0 lb

## 2019-06-20 DIAGNOSIS — I442 Atrioventricular block, complete: Secondary | ICD-10-CM

## 2019-06-20 DIAGNOSIS — I2510 Atherosclerotic heart disease of native coronary artery without angina pectoris: Secondary | ICD-10-CM

## 2019-06-20 LAB — CUP PACEART INCLINIC DEVICE CHECK
Brady Statistic RA Percent Paced: 3.8 %
Brady Statistic RV Percent Paced: 99.4 %
Date Time Interrogation Session: 20210426165001
Implantable Lead Implant Date: 20201216
Implantable Lead Implant Date: 20201216
Implantable Lead Location: 753859
Implantable Lead Location: 753860
Implantable Lead Model: 5076
Implantable Lead Model: 5076
Implantable Pulse Generator Implant Date: 20201216
Lead Channel Pacing Threshold Amplitude: 0.75 V
Lead Channel Pacing Threshold Pulse Width: 0.4 ms
Lead Channel Sensing Intrinsic Amplitude: 0.3 mV
Lead Channel Sensing Intrinsic Amplitude: 18.4 mV

## 2019-06-20 NOTE — Progress Notes (Signed)
Electrophysiology Office Note   Date:  06/20/2019   ID:  Charlissa, Petros [birth year], MRN [id number]  PCP:  [name], MD  Cardiologist:  Meda Coffee Primary Electrophysiologist:  Vyom Brass Meredith Leeds, MD    Chief Complaint: Pacemaker    History of Present Illness: [name] is a 74 y.o. female who is being seen today for the evaluation of pacemaker at the request of [name], MD. Presenting today for electrophysiology evaluation.  She has a history significant for coronary artery disease status post CABG with AVR in 3500 complicated by CVA, CKD stage II, diabetes, hypertension, hyperlipidemia, diastolic heart failure.  She is now also status post TAVR implant 03/01/2019.  She presented to the hospital December 2020 with acute on chronic heart failure in the setting of complete heart block.  She is now status post maker implanted 02/09/2019.  Today, she denies symptoms of palpitations, chest pain, shortness of breath, orthopnea, PND, lower extremity edema, claudication, dizziness, presyncope, syncope, bleeding, or neurologic sequela. The patient is tolerating medications without difficulties.    Past Medical History:  Diagnosis Date  . Acute on chronic diastolic CHF (congestive heart failure) ([place]) 06/22/2017  . Acute on chronic renal failure ([place]) 05/25/2011  . Adenocarcinoma of breast ([place]) 1996   Completed tamoxifen and had mastectomy.  . Aortic stenosis, severe    s/p aortic valve replacement with porcine valve 06/2004.  ECHO 2010 EF 93%, LVH, diastolic dysfxn, Bioprostetic aoritc valve, mild AS. ECHO 2013 EF 60%, Nl aortic artificial valve, dynamic obstruction in the outflow tract   Class IIb rec for annual TTE after 5 yrs. She had a TTE 2013    . Arthritis   . CAD (coronary artery disease) 2006   s/p CABG (5/06) w/ saphenous vein to RCA at time of AVR  . CKD (chronic kidney disease) stage 2, GFR 60-89 ml/min 06/03/2011   She is no longer taking NSAIDs.     Marland Kitchen CVA (cerebral infarction) 2006   Post-op from AVR. Presumed embolic in nature. Carotid stenosis of R 60-79%. Repeat dopplers 4/10 no R stenosis and L stenosis of 1-29%.  . Depression    Controlled on Paxil  . Diabetes mellitus 1992   Dx 04/25/1990. Now insulin dependent, started 2008. On ACEI.   . Diverticulosis 2001  . History of 2019 novel coronavirus disease (COVID-19)   . Hyperlipidemia    Mgmt with a statin  . Hypertension    Requires 4 drug tx  . Osteoporosis 2006   DEXA 10/06 : L femur T -2.8, R -2.7. Lumbar T -2.4. On bisphosphonates and  Calcium / Vit D.  . Presence of permanent cardiac pacemaker   . Refusal of blood transfusions as patient is Jehovah's Witness   . Stroke [facility])    Past Surgical History:  Procedure Laterality Date  . ABDOMINAL HYSTERECTOMY  1987   for fibroids  . AORTIC VALVE REPLACEMENT  2006  . CHOLECYSTECTOMY    . CORONARY ARTERY BYPASS GRAFT  2006   Saphenous vein to RCA at time of AVR. Course complicated by acute respiratory failure, post-op PTX, ARI, ileus, CVA  . MASTECTOMY Left 1995   L for adenocarcinoma  . PACEMAKER IMPLANT N/A 02/09/2019   Procedure: PACEMAKER IMPLANT;  Surgeon: Constance Haw, MD;  Location: [facility];  Service: Cardiovascular;  Laterality: N/A;  . RIGHT HEART CATH AND CORONARY/GRAFT ANGIOGRAPHY N/A 02/09/2019   Procedure: RIGHT HEART CATH AND CORONARY/GRAFT ANGIOGRAPHY;  Surgeon: Sherren Mocha,  MD;  Location: [place];  Service: Cardiovascular;  Laterality: N/A;  . TRANSCATHETER AORTIC VALVE REPLACEMENT, TRANSFEMORAL N/A 03/01/2019   Procedure: TRANSCATHETER AORTIC VALVE REPLACEMENT, TRANSFEMORAL;  Surgeon: Sherren Mocha, MD;  Location: [facility];  Service: Open Heart Surgery;  Laterality: N/A;     Current Outpatient Medications  Medication Sig Dispense Refill  . Accu-Chek FastClix Lancets MISC Use to check blood sugar 4 times a day. diag code E11.22. insulin dependent 408 each 2  .  acetaminophen (TYLENOL) 500 MG tablet Take 2 tablets (1,000 mg total) by mouth every 6 (six) hours as needed. 100 tablet 2  . amLODipine (NORVASC) 5 MG tablet Take 1 tablet (5 mg total) by mouth daily. 90 tablet 1  . amoxicillin (AMOXIL) 500 MG tablet Take 4 capsules (2,000 mg) one hour prior to all dental visits. 8 tablet 11  . aspirin 81 MG EC tablet TAKE 1 TABLET (81 MG TOTAL) BY MOUTH DAILY. SWALLOW WHOLE. (Patient taking differently: Take 81 mg by mouth daily. Swallow whole) 30 tablet 3  . atorvastatin (LIPITOR) 80 MG tablet TAKE 1 TABLET BY MOUTH AT BEDTIME 30 tablet 5  . clopidogrel (PLAVIX) 75 MG tablet Take 1 tablet (75 mg total) by mouth daily with breakfast. 90 tablet 3  . cyanocobalamin (,VITAMIN B-12,) 1000 MCG/ML injection Inject 1 mL (1,000 mcg total) into the muscle once a week. For 4 weeks 4 mL 0  . diclofenac Sodium (VOLTAREN) 1 % GEL Apply 2 g topically 4 (four) times daily. 50 g 1  . EASY TOUCH PEN NEEDLES 31G X 5 MM MISC USE AS DIRECTED FOUR TIMES A DAY 100 each 1  . furosemide (LASIX) 40 MG tablet TAKE 1 TABLET (40 MG TOTAL) BY MOUTH DAILY. 30 tablet 5  . furosemide (LASIX) 40 MG tablet Take 1 tablet (40 mg total) by mouth daily as needed for edema (For weight above dry weight). 30 tablet 5  . glucose blood (CONTOUR NEXT TEST) test strip Use to check blood sugar 4 times daily. diag code E11.22. insulin dependent 100 each 12  . hydrALAZINE (APRESOLINE) 50 MG tablet Take 1 tablet (50 mg total) by mouth 3 (three) times daily. 270 tablet 0  . Insulin Degludec-Liraglutide (XULTOPHY) 100-3.6 UNIT-MG/ML SOPN Inject 10 Units into the skin daily. 15 mL 5  . metoprolol succinate (TOPROL-XL) 25 MG 24 hr tablet Take 1 tablet (25 mg total) by mouth daily. 90 tablet 1  . PARoxetine (PAXIL) 30 MG tablet TAKE 1 TABLET BY MOUTH DAILY (Patient taking differently: Take 30 mg by mouth daily. ) 30 tablet 11  . polyethylene glycol (MIRALAX / GLYCOLAX) packet Take 17 g by mouth daily. (Patient taking  differently: Take 17 g by mouth daily as needed for mild constipation. ) 14 each 0   No current facility-administered medications for this visit.    Allergies:   Other   Social History:  The patient  reports that she has never smoked. She has never used smokeless tobacco. She reports current alcohol use. She reports that she does not use drugs.   Family History:  The patient's family history includes Alzheimer's disease in her mother; Diabetes in her mother; Heart disease (age of onset: 14) in her sister; Heart disease (age of onset: 10) in her father; Hypertension in her mother; Kidney disease in her sister; Mental illness in her sister.    ROS:  Please see the history of present illness.   Otherwise, review of systems is positive for  none.   All other systems are reviewed and negative.    PHYSICAL EXAM: VS:  BP 128/74   Pulse 73   Ht 5\' 6"  (1.676 m)   Wt 173 lb (78.5 kg)   BMI 27.92 kg/m  , BMI Body mass index is 27.92 kg/m. GEN: Well nourished, well developed, in no acute distress  HEENT: normal  Neck: no JVD, carotid bruits, or masses Cardiac: RRR; no murmurs, rubs, or gallops,no edema  Respiratory:  clear to auscultation bilaterally, normal work of breathing GI: soft, nontender, nondistended, + BS MS: no deformity or atrophy  Skin: warm and dry, device pocket is well healed Neuro:  Strength and sensation are intact Psych: euthymic mood, full affect  EKG:  EKG is ordered today. Personal review of the ekg ordered shows sinus rhythm, ventricular paced, rate 73  Device interrogation is reviewed today in detail.  See PaceArt for details.   Recent Labs: 02/07/2019: TSH 1.630 02/24/2019: ALT 13; B Natriuretic Peptide 408.4 03/02/2019: Hemoglobin 10.9; Magnesium 1.7; Platelets 222 05/19/2019: BUN 29; Creatinine, Ser 1.51; Potassium 3.6; Sodium 141    Lipid Panel     Component Value Date/Time   CHOL 99 02/07/2019 2012   CHOL 148 05/25/2015 1106   TRIG 117 02/07/2019 2012     HDL 26 (L) 02/07/2019 2012   HDL 41 05/25/2015 1106   CHOLHDL 3.8 02/07/2019 2012   VLDL 23 02/07/2019 2012   LDLCALC 50 02/07/2019 2012   LDLCALC 79 05/25/2015 1106     Wt Readings from Last 3 Encounters:  06/20/19 173 lb (78.5 kg)  06/16/19 171 lb 9.6 oz (77.8 kg)  05/19/19 168 lb 14.4 oz (76.6 kg)      Other studies Reviewed: Additional studies/ records that were reviewed today include: TTE 04/23/18  Review of the above records today demonstrates:  1. Left ventricular ejection fraction, by estimation, is 65 to 70%. The  left ventricle has hyperdynamic function. The left ventricle has no  regional wall motion abnormalities. There is severe concentric left  ventricular hypertrophy. Left ventricular  diastolic parameters are consistent with Grade I diastolic dysfunction  (impaired relaxation). Elevated left ventricular end-diastolic pressure.  2. Right ventricular systolic function is normal. The right ventricular  size is normal. There is normal pulmonary artery systolic pressure.  3. Left atrial size was moderately dilated.  4. Right atrial size was mildly dilated.  5. The mitral valve is degenerative. Trivial mitral valve regurgitation.  Moderate mitral stenosis. The mean mitral valve gradient is 9.0 mmHg.  6. The aortic valve has been repaired/replaced. There is a 23 mm  Medtronic CoreValve-Evolut Pro prosthetic (TAVR) valve present in the  aortic position. Procedure Date: 03/01/2019. Aortic valve area, by VTI  measures 1.21 cm. Aortic valve mean gradient  measures 12.0 mmHg. Trivial paravalvular regurgitation. No definite  valvular regurgitation.  7. The inferior vena cava is normal in size with greater than 50%  respiratory variability, suggesting right atrial pressure of 3 mmHg.    ASSESSMENT AND PLAN:  1.  Complete heart block: Status post Medtronic dual-chamber pacemaker implanted December 2020.  Device functioning appropriately.  No changes.  2.  Severe  aortic stenosis: Status post TAVR January 2021.  No obvious issues on most recent echo.  Continue to monitor.  3.  Hypertension: Currently well controlled    Current medicines are reviewed at length with the patient today.   The patient does not have concerns regarding her medicines.  The following changes were made today:  none  Labs/ tests ordered today include:  Orders Placed This Encounter  Procedures  . EKG 12-Lead     Disposition:   FU with Eamon Tantillo 9 months  Signed, Abbegail Matuska Meredith Leeds, MD  06/20/2019 3:43 PM     [facility] [address] [place] [address] [phone] (office) [phone] (fax)

## 2019-06-20 NOTE — Progress Notes (Signed)
Internal Medicine Clinic Attending  CCM services provided by the care management provider and their documentation were discussed with Dr. Gilford Rile. We reviewed the pertinent findings, urgent action items addressed by the resident and non-urgent items to be addressed by the PCP.  I agree with the assessment, diagnosis, and plan of care documented in the CCM and resident's note.  Larey Dresser, MD 06/20/2019

## 2019-06-24 ENCOUNTER — Emergency Department (HOSPITAL_COMMUNITY)
Admission: EM | Admit: 2019-06-24 | Discharge: 2019-06-24 | Disposition: A | Discharge status: Home / Self Care | Payer: Medicare Other | Attending: Emergency Medicine | Admitting: Emergency Medicine

## 2019-06-24 ENCOUNTER — Telehealth: Payer: Self-pay | Admitting: Internal Medicine

## 2019-06-24 ENCOUNTER — Encounter (HOSPITAL_COMMUNITY): Payer: Self-pay | Admitting: Emergency Medicine

## 2019-06-24 ENCOUNTER — Emergency Department (HOSPITAL_COMMUNITY): Payer: Medicare Other

## 2019-06-24 ENCOUNTER — Ambulatory Visit: Payer: Medicare Other | Admitting: *Deleted

## 2019-06-24 DIAGNOSIS — I5032 Chronic diastolic (congestive) heart failure: Secondary | ICD-10-CM | POA: Diagnosis not present

## 2019-06-24 DIAGNOSIS — Z7984 Long term (current) use of oral hypoglycemic drugs: Secondary | ICD-10-CM | POA: Diagnosis not present

## 2019-06-24 DIAGNOSIS — Z79899 Other long term (current) drug therapy: Secondary | ICD-10-CM | POA: Insufficient documentation

## 2019-06-24 DIAGNOSIS — I2510 Atherosclerotic heart disease of native coronary artery without angina pectoris: Secondary | ICD-10-CM | POA: Insufficient documentation

## 2019-06-24 DIAGNOSIS — R2243 Localized swelling, mass and lump, lower limb, bilateral: Secondary | ICD-10-CM | POA: Diagnosis present

## 2019-06-24 DIAGNOSIS — Z951 Presence of aortocoronary bypass graft: Secondary | ICD-10-CM | POA: Diagnosis not present

## 2019-06-24 DIAGNOSIS — E119 Type 2 diabetes mellitus without complications: Secondary | ICD-10-CM | POA: Diagnosis not present

## 2019-06-24 DIAGNOSIS — I10 Essential (primary) hypertension: Secondary | ICD-10-CM

## 2019-06-24 DIAGNOSIS — Z794 Long term (current) use of insulin: Secondary | ICD-10-CM

## 2019-06-24 DIAGNOSIS — E1122 Type 2 diabetes mellitus with diabetic chronic kidney disease: Secondary | ICD-10-CM

## 2019-06-24 DIAGNOSIS — I130 Hypertensive heart and chronic kidney disease with heart failure and stage 1 through stage 4 chronic kidney disease, or unspecified chronic kidney disease: Secondary | ICD-10-CM | POA: Diagnosis not present

## 2019-06-24 DIAGNOSIS — N182 Chronic kidney disease, stage 2 (mild): Secondary | ICD-10-CM | POA: Diagnosis not present

## 2019-06-24 DIAGNOSIS — R600 Localized edema: Secondary | ICD-10-CM

## 2019-06-24 DIAGNOSIS — N1832 Chronic kidney disease, stage 3b: Secondary | ICD-10-CM

## 2019-06-24 LAB — BASIC METABOLIC PANEL
Anion gap: 13 (ref 5–15)
BUN: 18 mg/dL (ref 8–23)
CO2: 29 mmol/L (ref 22–32)
Calcium: 9 mg/dL (ref 8.9–10.3)
Chloride: 98 mmol/L (ref 98–111)
Creatinine, Ser: 1.61 mg/dL — ABNORMAL HIGH (ref 0.44–1.00)
GFR calc Af Amer: 36 mL/min — ABNORMAL LOW (ref >60)
GFR calc non Af Amer: 31 mL/min — ABNORMAL LOW (ref >60)
Glucose, Bld: 242 mg/dL — ABNORMAL HIGH (ref 70–99)
Potassium: 3.5 mmol/L (ref 3.5–5.1)
Sodium: 140 mmol/L (ref 135–145)

## 2019-06-24 LAB — CBC
HCT: 38.8 % (ref 36.0–46.0)
Hemoglobin: 12.2 g/dL (ref 12.0–15.0)
MCH: 29.7 pg (ref 26.0–34.0)
MCHC: 31.4 g/dL (ref 30.0–36.0)
MCV: 94.4 fL (ref 80.0–100.0)
Platelets: 243 10*3/uL (ref 150–400)
RBC: 4.11 MIL/uL (ref 3.87–5.11)
RDW: 14.1 % (ref 11.5–15.5)
WBC: 8 10*3/uL (ref 4.0–10.5)
nRBC: 0 % (ref 0.0–0.2)

## 2019-06-24 LAB — TROPONIN I (HIGH SENSITIVITY)
Troponin I (High Sensitivity): 22 ng/L — ABNORMAL HIGH (ref <18)
Troponin I (High Sensitivity): 17 ng/L (ref <18)

## 2019-06-24 LAB — BRAIN NATRIURETIC PEPTIDE: B Natriuretic Peptide: 430.7 pg/mL — ABNORMAL HIGH (ref 0.0–100.0)

## 2019-06-24 MED ORDER — SODIUM CHLORIDE 0.9% FLUSH
3.0000 mL | Freq: Once | INTRAVENOUS | Status: AC
  Administered 2019-06-24: 3 mL via INTRAVENOUS

## 2019-06-24 MED ORDER — FUROSEMIDE 10 MG/ML IJ SOLN
40.0000 mg | Freq: Once | INTRAMUSCULAR | Status: AC
  Administered 2019-06-24: 17:00:00 40 mg via INTRAVENOUS
  Filled 2019-06-24: qty 4

## 2019-06-24 NOTE — Progress Notes (Signed)
Internal Medicine Clinic Attending  CCM services provided by the care management provider and their documentation were discussed with Dr. Shan Levans. We reviewed the pertinent findings, urgent action items addressed by the resident and non-urgent items to be addressed by the PCP.  I agree with the assessment, diagnosis, and plan of care documented in the CCM and resident's note.  Larey Dresser, MD 06/24/2019

## 2019-06-24 NOTE — Progress Notes (Signed)
Internal Medicine Clinic Resident   I have personally reviewed this encounter including the documentation in this note and/or discussed this patient with the care management provider. I will address any urgent items identified by the care management provider and will communicate my actions to the patient's PCP. I have reviewed the patient's CCM visit with my supervising attending.  Brandon Clatie Kessen, MD 06/24/2019    

## 2019-06-24 NOTE — Patient Instructions (Signed)
Visit Information It was nice speaking with you.  I'm glad you now have a blood pressure monitor in addition  to scales. Keep taking your daily weight and blood pressure as directed.   Goals Addressed            This Visit's Progress     Patient Stated   . "I can weigh myself without help and I write it down but I don't keep the paper with the weights on it after a while." (pt-stated)       CARE PLAN ENTRY (see longitudinal plan of care for additional care plan information)  Current Barriers:  . Chronic Disease Management support, education, and care coordination needs related to CHF, CAD, HTN, DMII, and CKD Stage 3 - patient states she is waiting for her home health physical therapist to arrive and that she is still receiving [facility] RN services to assess her chronic health condition issues, she says she is weighing every morning and that she was given a blood pressure monitor by the home health RN so she is taking her blood pressure daily.   Clinical Goal(s) related to CHF, CAD, HTN, DMII, and CKD Stage 3 :  Over the next 30 days, patient will:  . Work with the care management team to address educational, disease management, and care coordination needs  . Begin or continue self health monitoring activities as directed today Measure and record weight daily . Call provider office for new or worsened signs and symptoms Weight outside established parameters . Call care management team with questions or concerns . Verbalize basic understanding of patient centered plan of care established today  Interventions related to CHF, CAD, HTN, DMII, and CKD Stage 3 :  . Evaluation of current treatment plans and patient's adherence to plan as established by provider . Collaborated with appropriate clinical care team members regarding patient needs . Remote Health RN Asencion Partridge was provided with this CCM RN's contact number on 06/17/19 to request notification when [facility] is no longer monitoring patient   so this CCM RN can re-engage with patient   Patient Self Care Activities related to CHF, CAD, HTN, DMII, and CKD Stage 3 :  . Patient is unable to independently self-manage chronic health conditions  Please see past updates related to this goal by clicking on the "Past Updates" button in the selected goal         The patient verbalized understanding of instructions provided today and declined a print copy of patient instruction materials.   The care management team will reach out to the patient again over the next 7-14 days.   Kelli Churn RN, CCM, [facility] RN Care Manager [phone]

## 2019-06-24 NOTE — ED Triage Notes (Signed)
Pt here with c/i bil leg swelling , pt has had her lasix increased at home but her legs have not went "down"

## 2019-06-24 NOTE — ED Notes (Signed)
Patient verbalizes understanding of discharge instructions. Opportunity for questioning and answers were provided. Armband removed by staff. Patient discharged from ED.  

## 2019-06-24 NOTE — ED Notes (Signed)
Lab to add on BNP.  °

## 2019-06-24 NOTE — Telephone Encounter (Signed)
I saw [name] April 22 in the clinic and she was volume overloaded as evidenced by increased lower extremity edema up to the thighs, crackles in lung bases, and hypertension.  She had no pulmonary symptoms at that time.  I enrolled her in the hospital at home via remote health.  Today, I got a call from her home health caregiver who was in the home.  Yesterday was the last day with remote health.  She has noticed a 5 to 6 pound weight gain but was unable to state how long this has developed.  The home health caregiver noted that she had significant worsened lower extremity edema that caused inability to bend her knees and resulted in a controlled fall when she was trying to stand up from her recliner today.  She sustained no injuries from the fall.  I spoke to the patient at 1145 this morning and encouraged her to come to the ER for evaluation and likely admission for diuresis since home diuresis did not work.  The patient wanted time to think about it so I called her at 1 and she is ready to come to the hospital but has to call her daughter for a ride.

## 2019-06-24 NOTE — Chronic Care Management (AMB) (Signed)
Chronic Care Management   Follow Up Note   06/24/2019 Name: [name] MRN: [id number] DOB: 05/17/1945  Referred by: [name], MD Reason for referral : Chronic Care Management (HF, CAD, HTN, DM)   [name] is a 74 y.o. year old female who is a primary care patient of [name], MD. The CCM team was consulted for assistance with chronic disease management and care coordination needs.    Review of patient status, including review of consultants reports, relevant laboratory and other test results, and collaboration with appropriate care team members and the patient's provider was performed as part of comprehensive patient evaluation and provision of chronic care management services.    SDOH (Social Determinants of Health) assessments performed: No See Care Plan activities for detailed interventions related to [facility])     Outpatient Encounter Medications as of 06/24/2019  Medication Sig  . Accu-Chek FastClix Lancets MISC Use to check blood sugar 4 times a day. diag code E11.22. insulin dependent  . acetaminophen (TYLENOL) 500 MG tablet Take 2 tablets (1,000 mg total) by mouth every 6 (six) hours as needed.  Marland Kitchen amLODipine (NORVASC) 5 MG tablet Take 1 tablet (5 mg total) by mouth daily.  Marland Kitchen amoxicillin (AMOXIL) 500 MG tablet Take 4 capsules (2,000 mg) one hour prior to all dental visits.  Marland Kitchen aspirin 81 MG EC tablet TAKE 1 TABLET (81 MG TOTAL) BY MOUTH DAILY. SWALLOW WHOLE. (Patient taking differently: Take 81 mg by mouth daily. Swallow whole)  . atorvastatin (LIPITOR) 80 MG tablet TAKE 1 TABLET BY MOUTH AT BEDTIME  . clopidogrel (PLAVIX) 75 MG tablet Take 1 tablet (75 mg total) by mouth daily with breakfast.  . cyanocobalamin (,VITAMIN B-12,) 1000 MCG/ML injection Inject 1 mL (1,000 mcg total) into the muscle once a week. For 4 weeks  . diclofenac Sodium (VOLTAREN) 1 % GEL Apply 2 g topically 4 (four) times daily.  Marland Kitchen EASY TOUCH PEN NEEDLES 31G X 5 MM MISC USE AS  DIRECTED FOUR TIMES A DAY  . furosemide (LASIX) 40 MG tablet TAKE 1 TABLET (40 MG TOTAL) BY MOUTH DAILY.  . furosemide (LASIX) 40 MG tablet Take 1 tablet (40 mg total) by mouth daily as needed for edema (For weight above dry weight).  Marland Kitchen glucose blood (CONTOUR NEXT TEST) test strip Use to check blood sugar 4 times daily. diag code E11.22. insulin dependent  . hydrALAZINE (APRESOLINE) 50 MG tablet Take 1 tablet (50 mg total) by mouth 3 (three) times daily.  . Insulin Degludec-Liraglutide (XULTOPHY) 100-3.6 UNIT-MG/ML SOPN Inject 10 Units into the skin daily.  . metoprolol succinate (TOPROL-XL) 25 MG 24 hr tablet Take 1 tablet (25 mg total) by mouth daily.  Marland Kitchen PARoxetine (PAXIL) 30 MG tablet TAKE 1 TABLET BY MOUTH DAILY (Patient taking differently: Take 30 mg by mouth daily. )  . polyethylene glycol (MIRALAX / GLYCOLAX) packet Take 17 g by mouth daily. (Patient taking differently: Take 17 g by mouth daily as needed for mild constipation. )   No facility-administered encounter medications on file as of 06/24/2019.     Objective:  Wt Readings from Last 3 Encounters:  06/20/19 173 lb (78.5 kg)  06/16/19 171 lb 9.6 oz (77.8 kg)  05/19/19 168 lb 14.4 oz (76.6 kg)    Goals Addressed            This Visit's Progress     Patient Stated   . "I can weigh myself without help and I write it down  but I don't keep the paper with the weights on it after a while." (pt-stated)       CARE PLAN ENTRY (see longitudinal plan of care for additional care plan information)  Current Barriers:  . Chronic Disease Management support, education, and care coordination needs related to CHF, CAD, HTN, DMII, and CKD Stage 3 - patient states she is waiting for her home health physical therapist to arrive and that she is still receiving [facility] RN services to assess her chronic health condition issues, she says she is weighing every morning and that she was given a blood pressure monitor by the home health RN so she is taking  her blood pressure daily.   Clinical Goal(s) related to CHF, CAD, HTN, DMII, and CKD Stage 3 :  Over the next 30 days, patient will:  . Work with the care management team to address educational, disease management, and care coordination needs  . Begin or continue self health monitoring activities as directed today Measure and record weight daily . Call provider office for new or worsened signs and symptoms Weight outside established parameters . Call care management team with questions or concerns . Verbalize basic understanding of patient centered plan of care established today  Interventions related to CHF, CAD, HTN, DMII, and CKD Stage 3 :  . Evaluation of current treatment plans and patient's adherence to plan as established by provider . Collaborated with appropriate clinical care team members regarding patient needs . Remote [facility] RN Asencion Partridge was provided with this CCM RN's contact number on 06/17/19 to request notification when [facility] is no longer monitoring patient  so this CCM RN can re-engage with patient   Patient Self Care Activities related to CHF, CAD, HTN, DMII, and CKD Stage 3 :  . Patient is unable to independently self-manage chronic health conditions  Please see past updates related to this goal by clicking on the "Past Updates" button in the selected goal          Plan:   The care management team will reach out to the patient again over the next 7-14 days.    Kelli Churn RN, CCM, [facility] RN Care Manager [phone]

## 2019-06-24 NOTE — ED Provider Notes (Signed)
[place] EMERGENCY DEPARTMENT Provider Note   CSN: [id number] Arrival date & time: 06/24/19  1517     History No chief complaint on file.   [name] is a 74 y.o. female.  Patient is a 74 year old female with history of congestive heart failure, aortic stenosis, coronary artery disease with CABG, chronic renal insufficiency, diabetes, hypertension, and hyperlipidemia.  She presents today for evaluation of leg swelling.  Patient states that she has had increased leg swelling over the past several days that is making it difficult for her to walk.  She denies to me that she is experiencing chest pain or shortness of breath.  She denies any fevers or chills.  She does have Unna boots to both legs that have been applied by home health.  Patient takes Lasix and reports being compliant with this.  She denies increased sodium consumption.  The history is provided by the patient.       Past Medical History:  Diagnosis Date  . Acute on chronic diastolic CHF (congestive heart failure) ([place]) 06/22/2017  . Acute on chronic renal failure ([place]) 05/25/2011  . Adenocarcinoma of breast ([place]) 1996   Completed tamoxifen and had mastectomy.  . Aortic stenosis, severe    s/p aortic valve replacement with porcine valve 06/2004.  ECHO 2010 EF 37%, LVH, diastolic dysfxn, Bioprostetic aoritc valve, mild AS. ECHO 2013 EF 60%, Nl aortic artificial valve, dynamic obstruction in the outflow tract   Class IIb rec for annual TTE after 5 yrs. She had a TTE 2013    . Arthritis   . CAD (coronary artery disease) 2006   s/p CABG (5/06) w/ saphenous vein to RCA at time of AVR  . CKD (chronic kidney disease) stage 2, GFR 60-89 ml/min 06/03/2011   She is no longer taking NSAIDs.   Marland Kitchen CVA (cerebral infarction) 2006   Post-op from AVR. Presumed embolic in nature. Carotid stenosis of R 60-79%. Repeat dopplers 4/10 no R stenosis and L stenosis of 1-29%.  . Depression    Controlled on Paxil  .  Diabetes mellitus 1992   Dx 04/25/1990. Now insulin dependent, started 2008. On ACEI.   . Diverticulosis 2001  . History of 2019 novel coronavirus disease (COVID-19)   . Hyperlipidemia    Mgmt with a statin  . Hypertension    Requires 4 drug tx  . Osteoporosis 2006   DEXA 10/06 : L femur T -2.8, R -2.7. Lumbar T -2.4. On bisphosphonates and  Calcium / Vit D.  . Presence of permanent cardiac pacemaker   . Refusal of blood transfusions as patient is Jehovah's Witness   . Stroke [facility])     Patient Active Problem List   Diagnosis Date Noted  . Complete heart block ([place])   . Chronic diastolic congestive heart failure ([place]) 02/07/2019  . Peripheral arterial disease ([place]) 06/18/2017  . B12 deficiency 05/01/2017  . Edema of left lower extremity 11/08/2015  . Aortic atherosclerosis ([place]) 06/07/2014  . Abnormality of gait 05/29/2012  . Constipation 03/16/2012  . HOCM (hypertrophic obstructive cardiomyopathy) ([place]) 06/11/2011  . Routine health maintenance 06/10/2010  . Hyperlipidemia   . Refusal of blood transfusions as patient is Jehovah's Witness   . History of adenocarcinoma of breast   . Osteoporosis   . Status post CVA   . Aortic stenosis s/p Tissure AVR 2006   . CAD (coronary artery disease)   . Hypertension   . Depression 01/23/2006  . DM (diabetes mellitus) ([place]) 03/25/1990  .  Benign hypertensive heart and kidney disease with diastolic CHF, NYHA class 3 and CKD stage 3 ([place]) 1992  . Bradycardia 1992    Past Surgical History:  Procedure Laterality Date  . ABDOMINAL HYSTERECTOMY  1987   for fibroids  . AORTIC VALVE REPLACEMENT  2006  . CHOLECYSTECTOMY    . CORONARY ARTERY BYPASS GRAFT  2006   Saphenous vein to RCA at time of AVR. Course complicated by acute respiratory failure, post-op PTX, ARI, ileus, CVA  . MASTECTOMY Left 1995   L for adenocarcinoma  . PACEMAKER IMPLANT N/A 02/09/2019   Procedure: PACEMAKER IMPLANT;  Surgeon: Constance Haw, MD;  Location: [place] CV LAB;  Service: Cardiovascular;  Laterality: N/A;  . RIGHT HEART CATH AND CORONARY/GRAFT ANGIOGRAPHY N/A 02/09/2019   Procedure: RIGHT HEART CATH AND CORONARY/GRAFT ANGIOGRAPHY;  Surgeon: Sherren Mocha, MD;  Location: [place] CV LAB;  Service: Cardiovascular;  Laterality: N/A;  . TRANSCATHETER AORTIC VALVE REPLACEMENT, TRANSFEMORAL N/A 03/01/2019   Procedure: TRANSCATHETER AORTIC VALVE REPLACEMENT, TRANSFEMORAL;  Surgeon: Sherren Mocha, MD;  Location: [place] CV LAB;  Service: Open Heart Surgery;  Laterality: N/A;     OB History   No obstetric history on file.     Family History  Problem Relation Age of Onset  . Diabetes Mother   . Hypertension Mother   . Alzheimer's disease Mother   . Heart disease Father 19       AMI at age 21 and 28  . Mental illness Sister   . Heart disease Sister 71       AMI  . Kidney disease Sister     Social History   Tobacco Use  . Smoking status: Never Smoker  . Smokeless tobacco: Never Used  Substance Use Topics  . Alcohol use: Yes    Alcohol/week: 0.0 standard drinks    Comment: Occasional beer, monthly  . Drug use: No    Home Medications Prior to Admission medications   Medication Sig Start Date End Date Taking? Authorizing Provider  Accu-Chek FastClix Lancets MISC Use to check blood sugar 4 times a day. diag code E11.22. insulin dependent 10/05/18   [name], MD  acetaminophen (TYLENOL) 500 MG tablet Take 2 tablets (1,000 mg total) by mouth every 6 (six) hours as needed. 10/21/18 10/21/19  [place], Carley D, DO  amLODipine (NORVASC) 5 MG tablet Take 1 tablet (5 mg total) by mouth daily. 03/28/19   Eileen Stanford, PA-C  amoxicillin (AMOXIL) 500 MG tablet Take 4 capsules (2,000 mg) one hour prior to all dental visits. 03/31/19   Eileen Stanford, PA-C  aspirin 81 MG EC tablet TAKE 1 TABLET (81 MG TOTAL) BY MOUTH DAILY. SWALLOW WHOLE. Patient taking differently: Take 81 mg by mouth daily. Swallow whole  09/25/18   Harvie Heck, MD  atorvastatin (LIPITOR) 80 MG tablet TAKE 1 TABLET BY MOUTH AT BEDTIME 03/21/19   [name], MD  clopidogrel (PLAVIX) 75 MG tablet Take 1 tablet (75 mg total) by mouth daily with breakfast. 06/16/19   [name], MD  cyanocobalamin (,VITAMIN B-12,) 1000 MCG/ML injection Inject 1 mL (1,000 mcg total) into the muscle once a week. For 4 weeks 05/25/19   [name], MD  diclofenac Sodium (VOLTAREN) 1 % GEL Apply 2 g topically 4 (four) times daily. 06/16/19   [name], MD  EASY TOUCH PEN NEEDLES 31G X 5 MM MISC USE AS DIRECTED FOUR TIMES A DAY 05/04/19   Larey Dresser  A, MD  furosemide (LASIX) 40 MG tablet TAKE 1 TABLET (40 MG TOTAL) BY MOUTH DAILY. 03/21/19   [name], MD  furosemide (LASIX) 40 MG tablet Take 1 tablet (40 mg total) by mouth daily as needed for edema (For weight above dry weight). 05/04/19   Jeanmarie Hubert, MD  glucose blood (CONTOUR NEXT TEST) test strip Use to check blood sugar 4 times daily. diag code E11.22. insulin dependent 10/05/18   [name], MD  hydrALAZINE (APRESOLINE) 50 MG tablet Take 1 tablet (50 mg total) by mouth 3 (three) times daily. 06/16/19   [name], MD  Insulin Degludec-Liraglutide (XULTOPHY) 100-3.6 UNIT-MG/ML SOPN Inject 10 Units into the skin daily. 09/25/18   Harvie Heck, MD  metoprolol succinate (TOPROL-XL) 25 MG 24 hr tablet Take 1 tablet (25 mg total) by mouth daily. 01/26/19   [name], MD  PARoxetine (PAXIL) 30 MG tablet TAKE 1 TABLET BY MOUTH DAILY Patient taking differently: Take 30 mg by mouth daily.  11/17/18   [name], MD  polyethylene glycol [facility] Floria Raveling) packet Take 17 g by mouth daily. Patient taking differently: Take 17 g by mouth daily as needed for mild constipation.  06/18/17   [name], MD    Allergies    Other  Review of Systems   Review of Systems  All other systems reviewed and are  negative.   Physical Exam Updated Vital Signs BP (!) 160/84 (BP Location: Right Arm)   Pulse 76   Temp 98.4 F (36.9 C) (Oral)   Resp 16   Ht $R'5\' 6"'qN$  (1.676 m)   Wt 78.5 kg   SpO2 97%   BMI 27.92 kg/m   Physical Exam Vitals and nursing note reviewed.  Constitutional:      General: She is not in acute distress.    Appearance: She is well-developed. She is not diaphoretic.  HENT:     Head: Normocephalic and atraumatic.  Cardiovascular:     Rate and Rhythm: Normal rate and regular rhythm.     Heart sounds: No murmur. No friction rub. No gallop.   Pulmonary:     Effort: Pulmonary effort is normal. No respiratory distress.     Breath sounds: Normal breath sounds. No wheezing.  Abdominal:     General: Bowel sounds are normal. There is no distension.     Palpations: Abdomen is soft.     Tenderness: There is no abdominal tenderness.  Musculoskeletal:        General: Normal range of motion.     Cervical back: Normal range of motion and neck supple.     Right lower leg: Edema present.     Left lower leg: Edema present.     Comments: Patient with Unna boots in place.  She does have swelling/edema above the wraps at the knee, however do not appear to be constrictive.  There is edema to the toes, however capillary refill is brisk and motor and sensation is intact to all toes.  Skin:    General: Skin is warm and dry.  Neurological:     Mental Status: She is alert and oriented to person, place, and time.     ED Results / Procedures / Treatments   Labs (all labs ordered are listed, but only abnormal results are displayed) Labs Reviewed  BASIC METABOLIC PANEL - Abnormal; Notable for the following components:      Result Value   Glucose, Bld 242 (*)  Creatinine, Ser 1.61 (*)    GFR calc non Af Amer 31 (*)    GFR calc Af Amer 36 (*)    All other components within normal limits  TROPONIN I (HIGH SENSITIVITY) - Abnormal; Notable for the following components:   Troponin I (High  Sensitivity) 22 (*)    All other components within normal limits  CBC  BRAIN NATRIURETIC PEPTIDE    EKG None  Radiology DG Chest 2 View  Result Date: 06/24/2019 CLINICAL DATA:  Shortness of breath. EXAM: CHEST - 2 VIEW COMPARISON:  07/10/2018. FINDINGS: Cardiac pacer with lead tip over the right atrium right ventricle. Prior median sternotomy. Aortic valve replacement. Heart size stable. No focal infiltrate. No pleural effusion or pneumothorax. Stable elevation left hemidiaphragm. Surgical clips left axilla. IMPRESSION: 1. Cardiac pacer stable position. Prior CABG and aortic valve replacement again noted. Heart size stable. 2.  No acute pulmonary disease.  Chest stable from prior exam. Electronically Signed   By: Marcello Moores  Register   On: 06/24/2019 15:53    Procedures Procedures (including critical care time)  Medications Ordered in ED Medications  sodium chloride flush (NS) 0.9 % injection 3 mL (has no administration in time range)    ED Course  I have reviewed the triage vital signs and the nursing notes.  Pertinent labs & imaging results that were available during my care of the patient were reviewed by me and considered in my medical decision making (see chart for details).    MDM Rules/Calculators/A&P  Patient with history of CHF and lower extremity edema presenting with complaints of increased leg swelling.  She has bilateral leg wraps and swelling appears to be increased to the feet and knees.  The feet appear to be well perfused.  Patient was given IV Lasix with a good diuresis.  Her work-up shows no significant deviation from her baseline laboratory studies.  Her BNP is mildly elevated.  Chest x-ray shows no acute pulmonary disease.  Oxygen saturations are stable and she appears comfortable.  I see no indication for admission at this time.  Patient to be discharged with an increased dose of her Lasix and as needed return.  Final Clinical Impression(s) / ED Diagnoses Final  diagnoses:  None    Rx / DC Orders ED Discharge Orders    None       Veryl Speak, MD 06/24/19 1842

## 2019-06-24 NOTE — Discharge Instructions (Addendum)
Increase your Lasix to 40 mg twice daily for the next 5 days, then resume your normal 40 mg once daily dose.  Follow-up next week with your cardiologist, and return to the ER if symptoms significantly worsen or change.

## 2019-06-27 NOTE — Telephone Encounter (Signed)
Hi Dr. Lynnae January,   I apologize for the delay. I am looking into the matter and will get back with you.   Thanks,  Larkin Ina

## 2019-07-01 ENCOUNTER — Ambulatory Visit: Payer: Medicare Other | Admitting: *Deleted

## 2019-07-01 DIAGNOSIS — E1122 Type 2 diabetes mellitus with diabetic chronic kidney disease: Secondary | ICD-10-CM

## 2019-07-01 DIAGNOSIS — Z794 Long term (current) use of insulin: Secondary | ICD-10-CM

## 2019-07-01 DIAGNOSIS — I5032 Chronic diastolic (congestive) heart failure: Secondary | ICD-10-CM

## 2019-07-01 DIAGNOSIS — N1832 Chronic kidney disease, stage 3b: Secondary | ICD-10-CM

## 2019-07-01 NOTE — Patient Instructions (Signed)
Visit Information It was nice speaking with you today. Remember you can call the internal medicine clinic for an acute appointment  when you have a concern that is not an emergency.   Goals Addressed            This Visit's Progress     Patient Stated   . "I can weigh myself without help and I write it down but I don't keep the paper with the weights on it after a while." (pt-stated)       CARE PLAN ENTRY (see longitudinal plan of care for additional care plan information)  Current Barriers:  . Chronic Disease Management support, education, and care coordination needs related to CHF, CAD, HTN, DMII, and CKD Stage 3 - patient states she is doing well but sought help at [facility] Emergency Department (ED) on 4/30 because her legs were more swollen than usual, she said she was not having dyspnea or chest pain when she went to ED, she says a [facility] is still coming out weekly to assess her chronic health condition issues and wrap her legs, she says she is still weighing every morning and that she was given a blood pressure monitor by the home health RN so she is taking her blood pressure daily.   Clinical Goal(s) related to CHF, CAD, HTN, DMII, and CKD Stage 3 :  Over the next 30 days, patient will:  . Work with the care management team to address educational, disease management, and care coordination needs  . Begin or continue self health monitoring activities as directed today Measure and record weight daily . Call provider office for new or worsened signs and symptoms Weight outside established parameters . Call care management team with questions or concerns . Verbalize basic understanding of patient centered plan of care established today  Interventions related to CHF, CAD, HTN, DMII, and CKD Stage 3 :  . Evaluation of current treatment plans and patient's adherence to plan as established by provider- reviewed heart failure action plan including weight gain parameters that indicate need for   provider notification, also reminded patient she can call clinic to make acute appointment when she is worried about her LE edema or other non emergency issues . Assessed patient's current clinical status and discussed ED visit on 06/24/19 . Collaborated with appropriate clinical care team members regarding patient needs . [facility] RN Asencion Partridge was provided with this CCM RN's contact number on 06/17/19 to request notification when [facility] is no longer monitoring patient  so this CCM RN can re-engage with patient   Patient Self Care Activities related to CHF, CAD, HTN, DMII, and CKD Stage 3 :  . Patient is unable to independently self-manage chronic health conditions  Please see past updates related to this goal by clicking on the "Past Updates" button in the selected goal         The patient verbalized understanding of instructions provided today and declined a print copy of patient instruction materials.   The care management team will reach out to the patient again over the next 7-14 days.   Kelli Churn RN, CCM, [facility] RN Care Manager [phone]

## 2019-07-01 NOTE — Chronic Care Management (AMB) (Signed)
Chronic Care Management   Follow Up Note   07/01/2019 Name: [name] MRN: [id number] DOB: 02-12-1946  Referred by: Audrey Crews, MD Reason for referral : Chronic Care Management (HF, CAD, HTN, DM)   [name] is a 74 y.o. year old female who is a primary care patient of Audrey Crews, MD. The CCM team was consulted for assistance with chronic disease management and care coordination needs.    Review of patient status, including review of consultants reports, relevant laboratory and other test results, and collaboration with appropriate care team members and the patient's provider was performed as part of comprehensive patient evaluation and provision of chronic care management services.    SDOH (Social Determinants of Health) assessments performed: No See Care Plan activities for detailed interventions related to [facility])     Outpatient Encounter Medications as of 07/01/2019  Medication Sig  . Accu-Chek FastClix Lancets MISC Use to check blood sugar 4 times a day. diag code E11.22. insulin dependent  . acetaminophen (TYLENOL) 500 MG tablet Take 2 tablets (1,000 mg total) by mouth every 6 (six) hours as needed.  Marland Kitchen amLODipine (NORVASC) 5 MG tablet Take 1 tablet (5 mg total) by mouth daily.  Marland Kitchen amoxicillin (AMOXIL) 500 MG tablet Take 4 capsules (2,000 mg) one hour prior to all dental visits.  Marland Kitchen aspirin 81 MG EC tablet TAKE 1 TABLET (81 MG TOTAL) BY MOUTH DAILY. SWALLOW WHOLE. (Patient taking differently: Take 81 mg by mouth daily. Swallow whole)  . atorvastatin (LIPITOR) 80 MG tablet TAKE 1 TABLET BY MOUTH AT BEDTIME  . clopidogrel (PLAVIX) 75 MG tablet Take 1 tablet (75 mg total) by mouth daily with breakfast.  . cyanocobalamin (,VITAMIN B-12,) 1000 MCG/ML injection Inject 1 mL (1,000 mcg total) into the muscle once a week. For 4 weeks  . diclofenac Sodium (VOLTAREN) 1 % GEL Apply 2 g topically 4 (four) times daily.  Marland Kitchen EASY TOUCH PEN NEEDLES 31G X 5 MM MISC USE AS  DIRECTED FOUR TIMES A DAY  . furosemide (LASIX) 40 MG tablet TAKE 1 TABLET (40 MG TOTAL) BY MOUTH DAILY.  . furosemide (LASIX) 40 MG tablet Take 1 tablet (40 mg total) by mouth daily as needed for edema (For weight above dry weight).  Marland Kitchen glucose blood (CONTOUR NEXT TEST) test strip Use to check blood sugar 4 times daily. diag code E11.22. insulin dependent  . hydrALAZINE (APRESOLINE) 50 MG tablet Take 1 tablet (50 mg total) by mouth 3 (three) times daily.  . Insulin Degludec-Liraglutide (XULTOPHY) 100-3.6 UNIT-MG/ML SOPN Inject 10 Units into the skin daily.  . metoprolol succinate (TOPROL-XL) 25 MG 24 hr tablet Take 1 tablet (25 mg total) by mouth daily.  Marland Kitchen PARoxetine (PAXIL) 30 MG tablet TAKE 1 TABLET BY MOUTH DAILY (Patient taking differently: Take 30 mg by mouth daily. )  . polyethylene glycol (MIRALAX / GLYCOLAX) packet Take 17 g by mouth daily. (Patient taking differently: Take 17 g by mouth daily as needed for mild constipation. )   No facility-administered encounter medications on file as of 07/01/2019.     Objective:  Wt Readings from Last 3 Encounters:  06/24/19 173 lb (78.5 kg)  06/20/19 173 lb (78.5 kg)  06/16/19 171 lb 9.6 oz (77.8 kg)   Lab Results  Component Value Date   HGBA1C 8.6 (A) 05/19/2019   HGBA1C 6.6 (H) 02/24/2019   HGBA1C 6.8 (H) 02/07/2019   Lab Results  Component Value Date   MICROALBUR 50.9 (H) 06/07/2014  LDLCALC 50 02/07/2019   CREATININE 1.61 (H) 06/24/2019   Lab Results  Component Value Date   LABMICR See below: 09/01/2018   LABMICR 322.7 11/08/2015   MICROALBUR 50.9 (H) 06/07/2014   MICROALBUR 80.47 (H) 05/02/2008    BP Readings from Last 3 Encounters:  06/24/19 (!) 186/96  06/20/19 128/74  06/16/19 (!) 215/88     Goals Addressed            This Visit's Progress     Patient Stated   . "I can weigh myself without help and I write it down but I don't keep the paper with the weights on it after a while." (pt-stated)       CARE PLAN  ENTRY (see longitudinal plan of care for additional care plan information)  Current Barriers:  . Chronic Disease Management support, education, and care coordination needs related to CHF, CAD, HTN, DMII, and CKD Stage 3 - patient states she is doing well but sought help at [facility] Emergency Department (ED) on 4/30 because her legs were more swollen than usual, she said she was not having dyspnea or chest pain when she went to ED, she says a [facility] is still coming out weekly to assess her chronic health condition issues and wrap her legs, she says she is still weighing every morning and that she was given a blood pressure monitor by the home health RN so she is taking her blood pressure daily.   Clinical Goal(s) related to CHF, CAD, HTN, DMII, and CKD Stage 3 :  Over the next 30 days, patient will:  . Work with the care management team to address educational, disease management, and care coordination needs  . Begin or continue self health monitoring activities as directed today Measure and record weight daily . Call provider office for new or worsened signs and symptoms Weight outside established parameters . Call care management team with questions or concerns . Verbalize basic understanding of patient centered plan of care established today  Interventions related to CHF, CAD, HTN, DMII, and CKD Stage 3 :  Evaluation of current treatment plans and patient's adherence to plan as established by provider-reviewed heart failure action plan including weight gain parameters that indicate need for  provider notification, also reminded patient she can call clinic to make acute appointment when she is worried about her LE edema or other non emergency issues .  Marland Kitchen Assessed patient's current clinical status and discussed ED visit on 06/24/19 . Collaborated with appropriate clinical care team members regarding patient needs . [facility] RN Asencion Partridge was provided with this CCM RN's contact number on 06/17/19 to  request notification when [facility] is no longer monitoring patient  so this CCM RN can re-engage with patient   Patient Self Care Activities related to CHF, CAD, HTN, DMII, and CKD Stage 3 :  . Patient is unable to independently self-manage chronic health conditions  Please see past updates related to this goal by clicking on the "Past Updates" button in the selected goal          Plan:   The care management team will reach out to the patient again over the next 7-14 days.    Kelli Churn RN, CCM, [facility] RN Care Manager [phone]

## 2019-07-04 ENCOUNTER — Telehealth: Payer: Self-pay | Admitting: *Deleted

## 2019-07-04 NOTE — Telephone Encounter (Signed)
Debbie, [facility], calls and states pt has had a 5 lb wt gain in 3 days, please advise. Pt states no symptoms. HHN states pt is ready for compression hose, legs are looking good. Pt states someone told her they could get her the hose. Will send to [facility] and lauren. Pt has extra lasix in home

## 2019-07-04 NOTE — Telephone Encounter (Signed)
She does have new scripts Atorvastatin 80daily clopidogrel 75daily hydralizine 50 tid Metop sucer 25 daily Paroxetine $RemoveBeforeDE'30mg'bUfgnXseAhiHvLn$  daily Lasix $RemoveBef'40mg'PgaBnvHTgL$  prn Lasix 20 prn  K+ 10 daily ASA 325 instead of 81 daily Amlodipine $RemoveBeforeDE'5mg'gLEoYAPptRbshTg$  daily  Granddaughter came in and states that 2 people came everyday for 1 week, that they were both nurses and told her that they would not be back, the pt was using pill paks requested by [facility] And the 2 people made a pillbox for her so she has double medicine, but some meds were not in these 2 peoples med box. She has not checked sugar in 2 days because she doesn't have supplies. [facility] visits 1x week with some prn visits for wrapping legs.

## 2019-07-06 ENCOUNTER — Ambulatory Visit: Payer: Medicare Other | Admitting: *Deleted

## 2019-07-06 ENCOUNTER — Telehealth: Payer: Self-pay | Admitting: *Deleted

## 2019-07-06 ENCOUNTER — Telehealth: Payer: Self-pay | Admitting: Internal Medicine

## 2019-07-06 DIAGNOSIS — I10 Essential (primary) hypertension: Secondary | ICD-10-CM

## 2019-07-06 DIAGNOSIS — I5032 Chronic diastolic (congestive) heart failure: Secondary | ICD-10-CM

## 2019-07-06 DIAGNOSIS — E1165 Type 2 diabetes mellitus with hyperglycemia: Secondary | ICD-10-CM

## 2019-07-06 DIAGNOSIS — N1832 Chronic kidney disease, stage 3b: Secondary | ICD-10-CM

## 2019-07-06 DIAGNOSIS — IMO0002 Reserved for concepts with insufficient information to code with codable children: Secondary | ICD-10-CM

## 2019-07-06 DIAGNOSIS — E1122 Type 2 diabetes mellitus with diabetic chronic kidney disease: Secondary | ICD-10-CM

## 2019-07-06 DIAGNOSIS — E1129 Type 2 diabetes mellitus with other diabetic kidney complication: Secondary | ICD-10-CM

## 2019-07-06 DIAGNOSIS — Z794 Long term (current) use of insulin: Secondary | ICD-10-CM

## 2019-07-06 NOTE — Telephone Encounter (Signed)
  Community Resource Referral   Startex 07/06/2019  1st Attempt  [facility]  Name: CARRERA KIESEL   MRN: [id number]   DOB: [birth year]   AGE: 74 y.o.   GENDER: female   PCP [name], MD.   Called pt regarding Community Resource Referral LMTCB Follow up on: 07/06/2019 @ 3:00pm  Nicholson . [email or web address]  [phone]

## 2019-07-06 NOTE — Patient Instructions (Signed)
Visit Information It was nice speaking with you today. I will contact your provider about a lancet refill and a care guide about your transportation concerns.  Goals Addressed            This Visit's Progress     Patient Stated   . "I can weigh myself without help and I write it down but I don't keep the paper with the weights on it after a while." (pt-stated)       CARE PLAN ENTRY (see longitudinal plan of care for additional care plan information)  Current Barriers:  . Chronic Disease Management support, education, and care coordination needs related to CHF, CAD, HTN, DMII, and CKD Stage 3 - patient states she was not aware that she has an appointment for an EKG tomorrow at her cardiologist office and she says she has no way of getting there, she also says she is out of lancets and her drug store that supplies the lancets does not deliver , she says [facility] RNs are no longer making home visits but they did provide her with a scale and blood pressure cuff,  she says a HHRN from [facility] is still coming out weekly to assess her chronic health condition issues and wrap her legs, she says she has pills in both pill packs and medication boxes and she would prefer to use one system or the other as trying to use both is confusing to her.   Clinical Goal(s) related to CHF, CAD, HTN, DMII, and CKD Stage 3 :  Over the next 30 days, patient will:  . Work with the care management team to address educational, disease management, and care coordination needs  . Begin or continue self health monitoring activities as directed today Measure and record CBG (blood glucose) at least 1 times daily  . Measure and record weight daily . Call provider office for new or worsened signs and symptoms Weight of blood sugars outside established parameters . Call care management team with questions or concerns . Verbalize basic understanding of patient centered plan of care established today  Interventions  related to CHF, CAD, HTN, DMII, and CKD Stage 3 :  . Evaluation of current treatment plans and patient's adherence to plan as established by provider- reviewed heart failure action plan including weight gain parameters that indicate need for  provider notification, also reminded patient she can call clinic to make acute appointment when she is worried about her LE edema or other non emergency issues . Assessed patient's current clinical status . Collaborated with appropriate clinical care team members regarding patient needs . Discussed strategies to improve medication taking behavior . Will notify provider of need for lancet refill . Urgent referral to care guide for urgent and ongoing transportation needs  Patient Self Care Activities related to CHF, CAD, HTN, DMII, and CKD Stage 3 :  . Patient is unable to independently self-manage chronic health conditions  Please see past updates related to this goal by clicking on the "Past Updates" button in the selected goal         The patient verbalized understanding of instructions provided today and declined a print copy of patient instruction materials.   The care management team will reach out to the patient again over the next 7 days.   Kelli Churn RN, CCM, [facility] RN Care Manager [phone]

## 2019-07-06 NOTE — Telephone Encounter (Signed)
DSS transportation [place] at [facility] [place],  [place]  [phone]

## 2019-07-06 NOTE — Telephone Encounter (Signed)
Remote supposed to re-engage

## 2019-07-06 NOTE — Chronic Care Management (AMB) (Signed)
Chronic Care Management   Follow Up Note   07/06/2019 Name: [name] MRN: [id number] DOB: 1945/04/28  Referred by: Audrey Crews, MD Reason for referral : Chronic Care Management (DM, HF)   [name] is a 74 y.o. year old female who is a primary care patient of Audrey Crews, MD. The CCM team was consulted for assistance with chronic disease management and care coordination needs.    Review of patient status, including review of consultants reports, relevant laboratory and other test results, and collaboration with appropriate care team members and the patient's provider was performed as part of comprehensive patient evaluation and provision of chronic care management services.    SDOH (Social Determinants of Health) assessments performed: No See Care Plan activities for detailed interventions related to [facility])     Outpatient Encounter Medications as of 07/06/2019  Medication Sig  . Accu-Chek FastClix Lancets MISC Use to check blood sugar 4 times a day. diag code E11.22. insulin dependent- needs refill  . acetaminophen (TYLENOL) 500 MG tablet Take 2 tablets (1,000 mg total) by mouth every 6 (six) hours as needed.  Marland Kitchen amLODipine (NORVASC) 5 MG tablet Take 1 tablet (5 mg total) by mouth daily.  Marland Kitchen amoxicillin (AMOXIL) 500 MG tablet Take 4 capsules (2,000 mg) one hour prior to all dental visits.  Marland Kitchen aspirin 81 MG EC tablet TAKE 1 TABLET (81 MG TOTAL) BY MOUTH DAILY. SWALLOW WHOLE. (Patient taking differently: Take 81 mg by mouth daily. Swallow whole)  . atorvastatin (LIPITOR) 80 MG tablet TAKE 1 TABLET BY MOUTH AT BEDTIME  . clopidogrel (PLAVIX) 75 MG tablet Take 1 tablet (75 mg total) by mouth daily with breakfast.  . cyanocobalamin (,VITAMIN B-12,) 1000 MCG/ML injection Inject 1 mL (1,000 mcg total) into the muscle once a week. For 4 weeks  . diclofenac Sodium (VOLTAREN) 1 % GEL Apply 2 g topically 4 (four) times daily.  Marland Kitchen EASY TOUCH PEN NEEDLES 31G X 5 MM MISC  USE AS DIRECTED FOUR TIMES A DAY  . furosemide (LASIX) 40 MG tablet TAKE 1 TABLET (40 MG TOTAL) BY MOUTH DAILY.  . furosemide (LASIX) 40 MG tablet Take 1 tablet (40 mg total) by mouth daily as needed for edema (For weight above dry weight).  Marland Kitchen glucose blood (CONTOUR NEXT TEST) test strip Use to check blood sugar 4 times daily. diag code E11.22. insulin dependent  . hydrALAZINE (APRESOLINE) 50 MG tablet Take 1 tablet (50 mg total) by mouth 3 (three) times daily.  . Insulin Degludec-Liraglutide (XULTOPHY) 100-3.6 UNIT-MG/ML SOPN Inject 10 Units into the skin daily.  . metoprolol succinate (TOPROL-XL) 25 MG 24 hr tablet Take 1 tablet (25 mg total) by mouth daily.  Marland Kitchen PARoxetine (PAXIL) 30 MG tablet TAKE 1 TABLET BY MOUTH DAILY (Patient taking differently: Take 30 mg by mouth daily. )  . polyethylene glycol (MIRALAX / GLYCOLAX) packet Take 17 g by mouth daily. (Patient taking differently: Take 17 g by mouth daily as needed for mild constipation. )   No facility-administered encounter medications on file as of 07/06/2019.     Objective:   Lab Results  Component Value Date   HGBA1C 8.6 (A) 05/19/2019   HGBA1C 6.6 (H) 02/24/2019   HGBA1C 6.8 (H) 02/07/2019   Lab Results  Component Value Date   MICROALBUR 50.9 (H) 06/07/2014   LDLCALC 50 02/07/2019   CREATININE 1.61 (H) 06/24/2019   Lab Results  Component Value Date   LABMICR See below: 09/01/2018   LABMICR  322.7 11/08/2015   MICROALBUR 50.9 (H) 06/07/2014   MICROALBUR 80.47 (H) 05/02/2008   Wt Readings from Last 3 Encounters:  06/24/19 173 lb (78.5 kg)  06/20/19 173 lb (78.5 kg)  06/16/19 171 lb 9.6 oz (77.8 kg)   BP Readings from Last 3 Encounters:  06/24/19 (!) 186/96  06/20/19 128/74  06/16/19 (!) 215/88    Goals Addressed            This Visit's Progress     Patient Stated   . "I can weigh myself without help and I write it down but I don't keep the paper with the weights on it after a while." (pt-stated)       CARE  PLAN ENTRY (see longitudinal plan of care for additional care plan information)  Current Barriers:  . Chronic Disease Management support, education, and care coordination needs related to CHF, CAD, HTN, DMII, and CKD Stage 3 - patient states she was not aware that she has an appointment for an EKG tomorrow at her cardiologist office and she says she has no way of getting there, she also says she is out of lancets and her drug store that supplies the lancets does not deliver , she says [facility] RNs are no longer making home visits but they did provide her with a scale and blood pressure cuff,  she says a HHRN from [facility] is still coming out weekly to assess her chronic health condition issues and wrap her legs, she says she has pills in both pill packs and medication boxes and she would prefer to use one system or the other as trying to use both is confusing to her.   Clinical Goal(s) related to CHF, CAD, HTN, DMII, and CKD Stage 3 :  Over the next 30 days, patient will:  . Work with the care management team to address educational, disease management, and care coordination needs  . Begin or continue self health monitoring activities as directed today Measure and record CBG (blood glucose) at least 1 times daily  . Measure and record weight daily . Call provider office for new or worsened signs and symptoms Weight of blood sugars outside established parameters . Call care management team with questions or concerns . Verbalize basic understanding of patient centered plan of care established today  Interventions related to CHF, CAD, HTN, DMII, and CKD Stage 3 :  . Evaluation of current treatment plans and patient's adherence to plan as established by provider- reviewed heart failure action plan including weight gain parameters that indicate need for  provider notification, also reminded patient she can call clinic to make acute appointment when she is worried about her LE edema or other non  emergency issues . Assessed patient's current clinical status . Collaborated with appropriate clinical care team members regarding patient needs . Discussed strategies to improve medication taking behavior . Will notify provider of need for lancet refill . Urgent referral to care guide for urgent and ongoing transportation needs and to investigate mail order delivery for DM supplies  Patient Self Care Activities related to CHF, CAD, HTN, DMII, and CKD Stage 3 :  . Patient is unable to independently self-manage chronic health conditions  Please see past updates related to this goal by clicking on the "Past Updates" button in the selected goal          Plan:   The care management team will reach out to the patient again over the next 7 days.    Marcie Bal  Derinda Sis, CCM, [facility] RN Care Manager [phone]

## 2019-07-07 ENCOUNTER — Ambulatory Visit (INDEPENDENT_AMBULATORY_CARE_PROVIDER_SITE_OTHER): Payer: Medicare Other | Admitting: Cardiology

## 2019-07-07 ENCOUNTER — Other Ambulatory Visit: Payer: Self-pay

## 2019-07-07 ENCOUNTER — Telehealth: Payer: Self-pay | Admitting: Internal Medicine

## 2019-07-07 ENCOUNTER — Encounter: Payer: Self-pay | Admitting: Cardiology

## 2019-07-07 VITALS — BP 180/74 | HR 70 | Ht 66.5 in | Wt 173.0 lb

## 2019-07-07 DIAGNOSIS — I10 Essential (primary) hypertension: Secondary | ICD-10-CM

## 2019-07-07 DIAGNOSIS — I2510 Atherosclerotic heart disease of native coronary artery without angina pectoris: Secondary | ICD-10-CM

## 2019-07-07 DIAGNOSIS — I5032 Chronic diastolic (congestive) heart failure: Secondary | ICD-10-CM

## 2019-07-07 DIAGNOSIS — Z952 Presence of prosthetic heart valve: Secondary | ICD-10-CM

## 2019-07-07 DIAGNOSIS — Z950 Presence of cardiac pacemaker: Secondary | ICD-10-CM | POA: Diagnosis not present

## 2019-07-07 DIAGNOSIS — E7800 Pure hypercholesterolemia, unspecified: Secondary | ICD-10-CM

## 2019-07-07 DIAGNOSIS — I5030 Unspecified diastolic (congestive) heart failure: Secondary | ICD-10-CM

## 2019-07-07 DIAGNOSIS — I130 Hypertensive heart and chronic kidney disease with heart failure and stage 1 through stage 4 chronic kidney disease, or unspecified chronic kidney disease: Secondary | ICD-10-CM

## 2019-07-07 DIAGNOSIS — N183 Chronic kidney disease, stage 3 unspecified: Secondary | ICD-10-CM

## 2019-07-07 DIAGNOSIS — N189 Chronic kidney disease, unspecified: Secondary | ICD-10-CM

## 2019-07-07 MED ORDER — SPIRONOLACTONE 25 MG PO TABS: 12.5000 mg | ORAL_TABLET | Freq: Every day | ORAL | 1 refills | Status: DC

## 2019-07-07 MED ORDER — FUROSEMIDE 40 MG PO TABS: 40.0000 mg | ORAL_TABLET | Freq: Two times a day (BID) | ORAL | 0 refills | Status: DC

## 2019-07-07 NOTE — Telephone Encounter (Signed)
Pt is out of lancets and her drug store that supplies the lancets does not deliver. I spoke with a couple of pharmacies that do deliver but they will not deliver to Medicare [place] and B pt  I left a vm stating that I had checked both of these pharmacies that deliver prescriptions but will not deliver to Medicare patients and pharmacist suggested that she do mail order which she does but is out. Asked pt to call back in to see if the delivery from her pharmacy could be rushed.  Pharmacy  [place], [place]  [address], [place] [address]  Phone:  [phone] Fax:  [phone]  DEA #:  --      [facility] [place]) [address] Loletha Grayer [place], [place] [address] [place] [phone]   [address], [place] [address]

## 2019-07-07 NOTE — Patient Instructions (Signed)
Medication Instructions:   INCREASE YOUR LASIX TO 40 MG BY MOUTH TWICE DAILY--TAKE YOUR FIRST DOSE AROUND 8 AM AND THEN TAKE YOUR SECOND DOSE AROUND 2 PM EVERYDAY.  START TAKING SPIRONOLACTONE 12.5 MG BY MOUTH DAILY  *If you need a refill on your cardiac medications before your next appointment, please call your pharmacy*   Lab Work:  IN 4 WEEKS--SAME DAY AS YOU COME BACK TO SEE AN EXTENDER--WILL CHECK BMET AND PRO-BNP AT THAT TIME.  If you have labs (blood work) drawn today and your tests are completely normal, you will receive your results only by: Marland Kitchen MyChart Message (if you have MyChart) OR . A paper copy in the mail If you have any lab test that is abnormal or we need to change your treatment, we will call you to review the results.   Follow-Up:  4 WEEKS WITH AN EXTENDER IN THE OFFICE--SHE WILL NEED LAB WORK DONE SAME DAY AS THIS APPOINTMENT PER DR. Meda Coffee   At [facility], you and your health needs are our priority.  As part of our continuing mission to provide you with exceptional heart care, we have created designated Provider Care Teams.  These Care Teams include your primary Cardiologist (physician) and Advanced Practice Providers (APPs -  Physician Assistants and Nurse Practitioners) who all work together to provide you with the care you need, when you need it.  We recommend signing up for the patient portal called "MyChart".  Sign up information is provided on this After Visit Summary.  MyChart is used to connect with patients for Virtual Visits (Telemedicine).  Patients are able to view lab/test results, encounter notes, upcoming appointments, etc.  Non-urgent messages can be sent to your provider as well.   To learn more about what you can do with MyChart, go to [email or web address].    Your next appointment:   6 month(s)  The format for your next appointment:   In Person  Provider:   [name], MD
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
## 2019-07-07 NOTE — Telephone Encounter (Signed)
Incoming call pt she stated that she set up her transportation through [place] and will not need [employer]. Asked her about mail order delivery for testing supplies and she stated she is waiting to hear back about prescription for supplies

## 2019-07-08 ENCOUNTER — Other Ambulatory Visit: Payer: Self-pay | Admitting: *Deleted

## 2019-07-08 DIAGNOSIS — I2510 Atherosclerotic heart disease of native coronary artery without angina pectoris: Secondary | ICD-10-CM

## 2019-07-08 MED ORDER — ATORVASTATIN CALCIUM 80 MG PO TABS: 80.0000 mg | ORAL_TABLET | Freq: Every day | ORAL | 3 refills | Status: DC

## 2019-07-11 MED ORDER — CONTOUR NEXT TEST VI STRP: ORAL_STRIP | 12 refills | Status: DC

## 2019-07-11 MED ORDER — ACCU-CHEK FASTCLIX LANCETS MISC: 2 refills | Status: DC

## 2019-07-11 NOTE — Addendum Note (Signed)
Addended by: Mosetta Anis on: 07/11/2019 11:49 AM   Modules accepted: Orders

## 2019-07-11 NOTE — Progress Notes (Signed)
Internal Medicine Clinic Resident  I have personally reviewed this encounter including the documentation in this note and/or discussed this patient with the care management provider. I will address any urgent items identified by the care management provider and will communicate my actions to the patient's PCP. I have reviewed the patient's CCM visit with my supervising attending, Dr Rebeca Alert.  Mosetta Anis, MD 07/11/2019

## 2019-07-12 ENCOUNTER — Other Ambulatory Visit: Payer: Self-pay | Admitting: Internal Medicine

## 2019-07-12 DIAGNOSIS — IMO0002 Reserved for concepts with insufficient information to code with codable children: Secondary | ICD-10-CM

## 2019-07-12 DIAGNOSIS — E1122 Type 2 diabetes mellitus with diabetic chronic kidney disease: Secondary | ICD-10-CM

## 2019-07-12 DIAGNOSIS — E1165 Type 2 diabetes mellitus with hyperglycemia: Secondary | ICD-10-CM

## 2019-07-14 LAB — HEMOGLOBIN A1C: Hemoglobin A1C: 9

## 2019-07-17 ENCOUNTER — Inpatient Hospital Stay (HOSPITAL_COMMUNITY)
Admission: EM | Admit: 2019-07-17 | Discharge: 2019-07-22 | DRG: 314 | Disposition: A | Discharge status: Other Acute Inpatient Hospital | Payer: Medicare Other | Attending: Internal Medicine | Admitting: Internal Medicine

## 2019-07-17 ENCOUNTER — Emergency Department (HOSPITAL_COMMUNITY): Payer: Medicare Other

## 2019-07-17 ENCOUNTER — Encounter (HOSPITAL_COMMUNITY): Payer: Self-pay

## 2019-07-17 DIAGNOSIS — M810 Age-related osteoporosis without current pathological fracture: Secondary | ICD-10-CM | POA: Diagnosis present

## 2019-07-17 DIAGNOSIS — F329 Major depressive disorder, single episode, unspecified: Secondary | ICD-10-CM | POA: Diagnosis present

## 2019-07-17 DIAGNOSIS — I442 Atrioventricular block, complete: Secondary | ICD-10-CM | POA: Diagnosis present

## 2019-07-17 DIAGNOSIS — R509 Fever, unspecified: Secondary | ICD-10-CM

## 2019-07-17 DIAGNOSIS — Y831 Surgical operation with implant of artificial internal device as the cause of abnormal reaction of the patient, or of later complication, without mention of misadventure at the time of the procedure: Secondary | ICD-10-CM | POA: Diagnosis present

## 2019-07-17 DIAGNOSIS — Z833 Family history of diabetes mellitus: Secondary | ICD-10-CM

## 2019-07-17 DIAGNOSIS — Z8673 Personal history of transient ischemic attack (TIA), and cerebral infarction without residual deficits: Secondary | ICD-10-CM

## 2019-07-17 DIAGNOSIS — E785 Hyperlipidemia, unspecified: Secondary | ICD-10-CM | POA: Diagnosis present

## 2019-07-17 DIAGNOSIS — Z90710 Acquired absence of both cervix and uterus: Secondary | ICD-10-CM

## 2019-07-17 DIAGNOSIS — Z7982 Long term (current) use of aspirin: Secondary | ICD-10-CM

## 2019-07-17 DIAGNOSIS — Z950 Presence of cardiac pacemaker: Secondary | ICD-10-CM

## 2019-07-17 DIAGNOSIS — L89152 Pressure ulcer of sacral region, stage 2: Secondary | ICD-10-CM | POA: Diagnosis present

## 2019-07-17 DIAGNOSIS — J810 Acute pulmonary edema: Secondary | ICD-10-CM

## 2019-07-17 DIAGNOSIS — R600 Localized edema: Secondary | ICD-10-CM | POA: Diagnosis present

## 2019-07-17 DIAGNOSIS — Z531 Procedure and treatment not carried out because of patient's decision for reasons of belief and group pressure: Secondary | ICD-10-CM | POA: Diagnosis present

## 2019-07-17 DIAGNOSIS — Z952 Presence of prosthetic heart valve: Secondary | ICD-10-CM

## 2019-07-17 DIAGNOSIS — Z953 Presence of xenogenic heart valve: Secondary | ICD-10-CM

## 2019-07-17 DIAGNOSIS — A401 Sepsis due to streptococcus, group B: Secondary | ICD-10-CM | POA: Diagnosis present

## 2019-07-17 DIAGNOSIS — E1122 Type 2 diabetes mellitus with diabetic chronic kidney disease: Secondary | ICD-10-CM | POA: Diagnosis present

## 2019-07-17 DIAGNOSIS — E1165 Type 2 diabetes mellitus with hyperglycemia: Secondary | ICD-10-CM | POA: Diagnosis present

## 2019-07-17 DIAGNOSIS — Z8616 Personal history of COVID-19: Secondary | ICD-10-CM

## 2019-07-17 DIAGNOSIS — Z794 Long term (current) use of insulin: Secondary | ICD-10-CM

## 2019-07-17 DIAGNOSIS — I2510 Atherosclerotic heart disease of native coronary artery without angina pectoris: Secondary | ICD-10-CM | POA: Diagnosis present

## 2019-07-17 DIAGNOSIS — I5032 Chronic diastolic (congestive) heart failure: Secondary | ICD-10-CM | POA: Diagnosis present

## 2019-07-17 DIAGNOSIS — R7881 Bacteremia: Secondary | ICD-10-CM | POA: Diagnosis present

## 2019-07-17 DIAGNOSIS — B951 Streptococcus, group B, as the cause of diseases classified elsewhere: Secondary | ICD-10-CM | POA: Diagnosis present

## 2019-07-17 DIAGNOSIS — Z820 Family history of epilepsy and other diseases of the nervous system: Secondary | ICD-10-CM

## 2019-07-17 DIAGNOSIS — L89154 Pressure ulcer of sacral region, stage 4: Secondary | ICD-10-CM | POA: Insufficient documentation

## 2019-07-17 DIAGNOSIS — Z818 Family history of other mental and behavioral disorders: Secondary | ICD-10-CM

## 2019-07-17 DIAGNOSIS — Z841 Family history of disorders of kidney and ureter: Secondary | ICD-10-CM

## 2019-07-17 DIAGNOSIS — E872 Acidosis: Secondary | ICD-10-CM | POA: Diagnosis present

## 2019-07-17 DIAGNOSIS — Z951 Presence of aortocoronary bypass graft: Secondary | ICD-10-CM

## 2019-07-17 DIAGNOSIS — E1151 Type 2 diabetes mellitus with diabetic peripheral angiopathy without gangrene: Secondary | ICD-10-CM | POA: Diagnosis present

## 2019-07-17 DIAGNOSIS — I130 Hypertensive heart and chronic kidney disease with heart failure and stage 1 through stage 4 chronic kidney disease, or unspecified chronic kidney disease: Secondary | ICD-10-CM | POA: Diagnosis present

## 2019-07-17 DIAGNOSIS — Z9012 Acquired absence of left breast and nipple: Secondary | ICD-10-CM

## 2019-07-17 DIAGNOSIS — R32 Unspecified urinary incontinence: Secondary | ICD-10-CM | POA: Diagnosis present

## 2019-07-17 DIAGNOSIS — Z79899 Other long term (current) drug therapy: Secondary | ICD-10-CM

## 2019-07-17 DIAGNOSIS — Z8249 Family history of ischemic heart disease and other diseases of the circulatory system: Secondary | ICD-10-CM

## 2019-07-17 DIAGNOSIS — M1990 Unspecified osteoarthritis, unspecified site: Secondary | ICD-10-CM | POA: Diagnosis present

## 2019-07-17 DIAGNOSIS — G9341 Metabolic encephalopathy: Secondary | ICD-10-CM | POA: Insufficient documentation

## 2019-07-17 DIAGNOSIS — E119 Type 2 diabetes mellitus without complications: Secondary | ICD-10-CM

## 2019-07-17 DIAGNOSIS — Z853 Personal history of malignant neoplasm of breast: Secondary | ICD-10-CM

## 2019-07-17 DIAGNOSIS — I350 Nonrheumatic aortic (valve) stenosis: Secondary | ICD-10-CM

## 2019-07-17 DIAGNOSIS — N1832 Chronic kidney disease, stage 3b: Secondary | ICD-10-CM | POA: Diagnosis present

## 2019-07-17 DIAGNOSIS — T827XXA Infection and inflammatory reaction due to other cardiac and vascular devices, implants and grafts, initial encounter: Principal | ICD-10-CM

## 2019-07-17 DIAGNOSIS — A419 Sepsis, unspecified organism: Secondary | ICD-10-CM

## 2019-07-17 DIAGNOSIS — R0600 Dyspnea, unspecified: Secondary | ICD-10-CM | POA: Diagnosis not present

## 2019-07-17 DIAGNOSIS — L89153 Pressure ulcer of sacral region, stage 3: Secondary | ICD-10-CM | POA: Diagnosis present

## 2019-07-17 LAB — URINALYSIS, ROUTINE W REFLEX MICROSCOPIC
Bilirubin Urine: NEGATIVE
Glucose, UA: 150 mg/dL — AB
Ketones, ur: NEGATIVE mg/dL
Leukocytes,Ua: NEGATIVE
Nitrite: NEGATIVE
Protein, ur: 100 mg/dL — AB
Specific Gravity, Urine: 1.009 (ref 1.005–1.030)
pH: 8 (ref 5.0–8.0)

## 2019-07-17 LAB — CBC WITH DIFFERENTIAL/PLATELET
Abs Immature Granulocytes: 0.15 10*3/uL — ABNORMAL HIGH (ref 0.00–0.07)
Basophils Absolute: 0 10*3/uL (ref 0.0–0.1)
Basophils Relative: 0 %
Eosinophils Absolute: 0.1 10*3/uL (ref 0.0–0.5)
Eosinophils Relative: 0 %
HCT: 40 % (ref 36.0–46.0)
Hemoglobin: 12.8 g/dL (ref 12.0–15.0)
Immature Granulocytes: 1 %
Lymphocytes Relative: 3 %
Lymphs Abs: 0.6 10*3/uL — ABNORMAL LOW (ref 0.7–4.0)
MCH: 30.3 pg (ref 26.0–34.0)
MCHC: 32 g/dL (ref 30.0–36.0)
MCV: 94.8 fL (ref 80.0–100.0)
Monocytes Absolute: 0.8 10*3/uL (ref 0.1–1.0)
Monocytes Relative: 4 %
Neutro Abs: 17.3 10*3/uL — ABNORMAL HIGH (ref 1.7–7.7)
Neutrophils Relative %: 92 %
Platelets: 256 10*3/uL (ref 150–400)
RBC: 4.22 MIL/uL (ref 3.87–5.11)
RDW: 13.6 % (ref 11.5–15.5)
WBC: 18.9 10*3/uL — ABNORMAL HIGH (ref 4.0–10.5)
nRBC: 0 % (ref 0.0–0.2)

## 2019-07-17 LAB — PROTIME-INR
INR: 0.9 (ref 0.8–1.2)
Prothrombin Time: 11.5 seconds (ref 11.4–15.2)

## 2019-07-17 LAB — COMPREHENSIVE METABOLIC PANEL
ALT: 13 U/L (ref 0–44)
AST: 17 U/L (ref 15–41)
Albumin: 3.4 g/dL — ABNORMAL LOW (ref 3.5–5.0)
Alkaline Phosphatase: 141 U/L — ABNORMAL HIGH (ref 38–126)
Anion gap: 15 (ref 5–15)
BUN: 25 mg/dL — ABNORMAL HIGH (ref 8–23)
CO2: 27 mmol/L (ref 22–32)
Calcium: 9 mg/dL (ref 8.9–10.3)
Chloride: 95 mmol/L — ABNORMAL LOW (ref 98–111)
Creatinine, Ser: 1.65 mg/dL — ABNORMAL HIGH (ref 0.44–1.00)
GFR calc Af Amer: 35 mL/min — ABNORMAL LOW (ref >60)
GFR calc non Af Amer: 30 mL/min — ABNORMAL LOW (ref >60)
Glucose, Bld: 281 mg/dL — ABNORMAL HIGH (ref 70–99)
Potassium: 4.6 mmol/L (ref 3.5–5.1)
Sodium: 137 mmol/L (ref 135–145)
Total Bilirubin: 0.8 mg/dL (ref 0.3–1.2)
Total Protein: 7.6 g/dL (ref 6.5–8.1)

## 2019-07-17 LAB — APTT: aPTT: 27 seconds (ref 24–36)

## 2019-07-17 LAB — CBG MONITORING, ED: Glucose-Capillary: 273 mg/dL — ABNORMAL HIGH (ref 70–99)

## 2019-07-17 LAB — SARS CORONAVIRUS 2 BY RT PCR (HOSPITAL ORDER, PERFORMED IN ~~LOC~~ HOSPITAL LAB): SARS Coronavirus 2: NEGATIVE

## 2019-07-17 LAB — LACTIC ACID, PLASMA: Lactic Acid, Venous: 2.3 mmol/L (ref 0.5–1.9)

## 2019-07-17 MED ORDER — SODIUM CHLORIDE 0.9 % IV SOLN
1.0000 g | INTRAVENOUS | Status: DC
  Administered 2019-07-17: 1 g via INTRAVENOUS
  Filled 2019-07-17: qty 10

## 2019-07-17 MED ORDER — LACTATED RINGERS IV BOLUS (SEPSIS)
1000.0000 mL | Freq: Once | INTRAVENOUS | Status: AC
  Administered 2019-07-17: 1000 mL via INTRAVENOUS

## 2019-07-17 MED ORDER — ACETAMINOPHEN 500 MG PO TABS
1000.0000 mg | ORAL_TABLET | Freq: Once | ORAL | Status: AC
  Administered 2019-07-17: 1000 mg via ORAL
  Filled 2019-07-17: qty 2

## 2019-07-17 MED ORDER — LACTATED RINGERS IV BOLUS (SEPSIS)
500.0000 mL | Freq: Once | INTRAVENOUS | Status: AC
  Administered 2019-07-17: 500 mL via INTRAVENOUS

## 2019-07-17 MED ORDER — IBUPROFEN 800 MG PO TABS
800.0000 mg | ORAL_TABLET | Freq: Once | ORAL | Status: AC
  Administered 2019-07-17: 800 mg via ORAL
  Filled 2019-07-17: qty 1

## 2019-07-17 NOTE — ED Triage Notes (Signed)
Pt came in [facility] b/c daughter called out stating she was not acting herself and seemed lost. Pt is A/Ox2 (Person/Place). Pt is having generalized weakness. 93%RA, 343CBG, 2041/101 20GIV LFA, 96HR, 25GF

## 2019-07-17 NOTE — ED Provider Notes (Signed)
[facility] EMERGENCY DEPARTMENT Provider Note   CSN: [id number] Arrival date & time: 07/17/19  2114     History No chief complaint on file.   [name] is a 74 y.o. female.  Pt presents to the ED today with AMS.  The pt lives at home, but with her family.  The family found pt wandering around at home confused.  She did not know where she was.  Pt denies any pain.         Past Medical History:  Diagnosis Date   Acute on chronic diastolic CHF (congestive heart failure) ([place]) 06/22/2017   Acute on chronic renal failure ([place]) 05/25/2011   Adenocarcinoma of breast ([place]) 1996   Completed tamoxifen and had mastectomy.   Aortic stenosis, severe    s/p aortic valve replacement with porcine valve 06/2004.  ECHO 2010 EF 40%, LVH, diastolic dysfxn, Bioprostetic aoritc valve, mild AS. ECHO 2013 EF 60%, Nl aortic artificial valve, dynamic obstruction in the outflow tract   Class IIb rec for annual TTE after 5 yrs. She had a TTE 2013     Arthritis    CAD (coronary artery disease) 2006   s/p CABG (5/06) w/ saphenous vein to RCA at time of AVR   CKD (chronic kidney disease) stage 2, GFR 60-89 ml/min 06/03/2011   She is no longer taking NSAIDs.    CVA (cerebral infarction) 2006   Post-op from AVR. Presumed embolic in nature. Carotid stenosis of R 60-79%. Repeat dopplers 4/10 no R stenosis and L stenosis of 1-29%.   Depression    Controlled on Paxil   Diabetes mellitus 1992   Dx 04/25/1990. Now insulin dependent, started 2008. On ACEI.    Diverticulosis 2001   History of 2019 novel coronavirus disease (COVID-19)    Hyperlipidemia    Mgmt with a statin   Hypertension    Requires 4 drug tx   Osteoporosis 2006   DEXA 10/06 : L femur T -2.8, R -2.7. Lumbar T -2.4. On bisphosphonates and  Calcium / Vit D.   Presence of permanent cardiac pacemaker    Refusal of blood transfusions as patient is Jehovah's Witness    Stroke [facility])     Patient Active  Problem List   Diagnosis Date Noted   Complete heart block ([place])    Chronic diastolic congestive heart failure ([place]) 02/07/2019   Peripheral arterial disease ([place]) 06/18/2017   B12 deficiency 05/01/2017   Edema of left lower extremity 11/08/2015   Aortic atherosclerosis ([place]) 06/07/2014   Abnormality of gait 05/29/2012   Constipation 03/16/2012   HOCM (hypertrophic obstructive cardiomyopathy) ([place]) 06/11/2011   Routine health maintenance 06/10/2010   Hyperlipidemia    Refusal of blood transfusions as patient is Jehovah's Witness    History of adenocarcinoma of breast    Osteoporosis    Status post CVA    Aortic stenosis s/p Tissure AVR 2006    CAD (coronary artery disease)    Hypertension    Depression 01/23/2006   DM (diabetes mellitus) ([place]) 03/25/1990   Benign hypertensive heart and kidney disease with diastolic CHF, NYHA class 3 and CKD stage 3 ([place]) 1992   Bradycardia 1992    Past Surgical History:  Procedure Laterality Date   ABDOMINAL HYSTERECTOMY  1987   for fibroids   AORTIC VALVE REPLACEMENT  2006   CHOLECYSTECTOMY     CORONARY ARTERY BYPASS GRAFT  2006   Saphenous vein to RCA at time of AVR. Course complicated by  acute respiratory failure, post-op PTX, ARI, ileus, CVA   MASTECTOMY Left 1995   L for adenocarcinoma   PACEMAKER IMPLANT N/A 02/09/2019   Procedure: PACEMAKER IMPLANT;  Surgeon: Constance Haw, MD;  Location: [place] CV LAB;  Service: Cardiovascular;  Laterality: N/A;   RIGHT HEART CATH AND CORONARY/GRAFT ANGIOGRAPHY N/A 02/09/2019   Procedure: RIGHT HEART CATH AND CORONARY/GRAFT ANGIOGRAPHY;  Surgeon: Sherren Mocha, MD;  Location: [place] CV LAB;  Service: Cardiovascular;  Laterality: N/A;   TRANSCATHETER AORTIC VALVE REPLACEMENT, TRANSFEMORAL N/A 03/01/2019   Procedure: TRANSCATHETER AORTIC VALVE REPLACEMENT, TRANSFEMORAL;  Surgeon: Sherren Mocha, MD;  Location: [place] CV LAB;  Service: Open Heart  Surgery;  Laterality: N/A;     OB History   No obstetric history on file.     Family History  Problem Relation Age of Onset   Diabetes Mother    Hypertension Mother    Alzheimer's disease Mother    Heart disease Father 37       AMI at age 60 and 72   Mental illness Sister    Heart disease Sister 73       AMI   Kidney disease Sister     Social History   Tobacco Use   Smoking status: Never Smoker   Smokeless tobacco: Never Used  Substance Use Topics   Alcohol use: Yes    Alcohol/week: 0.0 standard drinks    Comment: Occasional beer, monthly   Drug use: No    Home Medications Prior to Admission medications   Medication Sig Start Date End Date Taking? Authorizing Provider  Insulin Pen Needle (EASY TOUCH PEN NEEDLES) 31G X 5 MM MISC To inject insulin into the skin. INSULIN DEPENDENT. DIAG CODE E11.9 07/12/19   [name], MD  Accu-Chek FastClix Lancets MISC Use to check blood sugar 4 times a day. diag code E11.22. insulin dependent 07/11/19   Mosetta Anis, MD  acetaminophen (TYLENOL) 500 MG tablet Take 2 tablets (1,000 mg total) by mouth every 6 (six) hours as needed. 10/21/18 10/21/19  [place], Carley D, DO  amLODipine (NORVASC) 5 MG tablet Take 1 tablet (5 mg total) by mouth daily. 03/28/19   Eileen Stanford, PA-C  amoxicillin (AMOXIL) 500 MG tablet Take 4 capsules (2,000 mg) one hour prior to all dental visits. 03/31/19   Eileen Stanford, PA-C  aspirin 81 MG EC tablet TAKE 1 TABLET (81 MG TOTAL) BY MOUTH DAILY. SWALLOW WHOLE. Patient taking differently: Take 81 mg by mouth daily. Swallow whole 09/25/18   Harvie Heck, MD  atorvastatin (LIPITOR) 80 MG tablet Take 1 tablet (80 mg total) by mouth at bedtime. 07/08/19   [name], MD  clopidogrel (PLAVIX) 75 MG tablet Take 1 tablet (75 mg total) by mouth daily with breakfast. 06/16/19   [name], MD  cyanocobalamin (,VITAMIN B-12,) 1000 MCG/ML injection Inject 1 mL (1,000 mcg total)  into the muscle once a week. For 4 weeks 05/25/19   [name], MD  diclofenac Sodium (VOLTAREN) 1 % GEL Apply 2 g topically 4 (four) times daily. 06/16/19   [name], MD  furosemide (LASIX) 40 MG tablet Take 1 tablet (40 mg total) by mouth 2 (two) times daily. 07/07/19   Dorothy Spark, MD  glucose blood (CONTOUR NEXT TEST) test strip Use to check blood sugar 4 times daily. diag code E11.22. insulin dependent 07/11/19   Mosetta Anis, MD  hydrALAZINE (APRESOLINE) 50 MG tablet Take 1 tablet (50  mg total) by mouth 3 (three) times daily. 06/16/19   [name], MD  Insulin Degludec-Liraglutide (XULTOPHY) 100-3.6 UNIT-MG/ML SOPN Inject 10 Units into the skin daily. 09/25/18   Harvie Heck, MD  metoprolol succinate (TOPROL-XL) 25 MG 24 hr tablet Take 1 tablet (25 mg total) by mouth daily. 01/26/19   [name], MD  PARoxetine (PAXIL) 30 MG tablet TAKE 1 TABLET BY MOUTH DAILY Patient taking differently: Take 30 mg by mouth daily.  11/17/18   [name], MD  polyethylene glycol [facility] / Floria Raveling) packet Take 17 g by mouth daily. Patient taking differently: Take 17 g by mouth daily as needed for mild constipation.  06/18/17   [name], MD  spironolactone (ALDACTONE) 25 MG tablet Take 0.5 tablets (12.5 mg total) by mouth daily. 07/07/19   Dorothy Spark, MD    Allergies    Other  Review of Systems   Review of Systems  All other systems reviewed and are negative.   Physical Exam Updated Vital Signs BP (!) 161/85    Pulse 100    Temp (!) 105.1 F (40.6 C) (Rectal)    Resp (!) 30    Ht $R'5\' 6"'Ur$  (1.676 m)    Wt 76.7 kg    SpO2 92%    BMI 27.28 kg/m   Physical Exam Vitals and nursing note reviewed.  Constitutional:      Appearance: Normal appearance.  HENT:     Head: Normocephalic and atraumatic.     Right Ear: External ear normal.     Left Ear: External ear normal.     Nose: Nose normal.     Mouth/Throat:     Mouth: Mucous membranes  are dry.  Eyes:     Extraocular Movements: Extraocular movements intact.     Conjunctiva/sclera: Conjunctivae normal.     Pupils: Pupils are equal, round, and reactive to light.  Cardiovascular:     Rate and Rhythm: Regular rhythm. Tachycardia present.  Pulmonary:     Effort: Pulmonary effort is normal.     Breath sounds: Rhonchi present.  Abdominal:     General: Abdomen is flat. Bowel sounds are normal.     Palpations: Abdomen is soft.  Musculoskeletal:        General: Normal range of motion.     Cervical back: Normal range of motion and neck supple.  Skin:    General: Skin is warm.     Capillary Refill: Capillary refill takes less than 2 seconds.  Neurological:     General: No focal deficit present.     Mental Status: She is alert. She is disoriented.  Psychiatric:        Mood and Affect: Mood normal.        Behavior: Behavior normal.        Thought Content: Thought content normal.        Judgment: Judgment normal.     ED Results / Procedures / Treatments   Labs (all labs ordered are listed, but only abnormal results are displayed) Labs Reviewed  CBC WITH DIFFERENTIAL/PLATELET - Abnormal; Notable for the following components:      Result Value   WBC 18.9 (*)    Neutro Abs 17.3 (*)    Lymphs Abs 0.6 (*)    Abs Immature Granulocytes 0.15 (*)    All other components within normal limits  COMPREHENSIVE METABOLIC PANEL - Abnormal; Notable for the following components:   Chloride 95 (*)    Glucose, Bld  281 (*)    BUN 25 (*)    Creatinine, Ser 1.65 (*)    Albumin 3.4 (*)    Alkaline Phosphatase 141 (*)    GFR calc non Af Amer 30 (*)    GFR calc Af Amer 35 (*)    All other components within normal limits  LACTIC ACID, PLASMA - Abnormal; Notable for the following components:   Lactic Acid, Venous 2.3 (*)    All other components within normal limits  CBG MONITORING, ED - Abnormal; Notable for the following components:   Glucose-Capillary 273 (*)    All other components  within normal limits  SARS CORONAVIRUS 2 BY RT PCR (HOSPITAL ORDER, [place] [facility])  CULTURE, BLOOD (ROUTINE X 2)  CULTURE, BLOOD (ROUTINE X 2)  URINE CULTURE  APTT  PROTIME-INR  URINALYSIS, ROUTINE W REFLEX MICROSCOPIC    EKG EKG Interpretation  Date/Time:  Sunday Jul 17 2019 21:26:11 EDT Ventricular Rate:  94 PR Interval:    QRS Duration: 168 QT Interval:  417 QTC Calculation: 522 R Axis:   -77 Text Interpretation: Atrial-sensed ventricular-paced rhythm Confirmed by Isla Pence [phone]) on 07/17/2019 10:14:09 PM   Radiology DG Chest 2 View  Result Date: 07/17/2019 CLINICAL DATA:  Altered mental status and weakness EXAM: CHEST - 2 VIEW COMPARISON:  Radiograph 06/24/2019 FINDINGS: Pacer pack overlies the right chest wall with leads at the cardiac apex and right atrium in similar positioning to prior. And aortic valve stent graft is also in unchanged location from the comparison exam. Postsurgical changes related to prior CABG including intact and aligned sternotomy wires and multiple surgical clips projecting over the mediastinum. There are diffuse widespread interstitial and patchy developing airspace opacities throughout both lungs with indistinct, cephalized vascularity. No visible pneumothorax or effusion. Cardiac silhouette is mildly enlarged with dense calcific aortic atherosclerosis. No acute osseous or soft tissue abnormality. Degenerative changes are present in the imaged spine and shoulders. Telemetry leads overlie the chest. IMPRESSION: 1. Diffuse widespread interstitial and patchy developing airspace opacities throughout both lungs, favor edema/heart failure with cardiomegaly though infection could present similarly. 2.  Aortic Atherosclerosis (ICD10-I70.0). 3. Prior CABG and TAVR. Electronically Signed   By: Lovena Le M.D.   On: 07/17/2019 22:52   CT Head Wo Contrast  Result Date: 07/17/2019 CLINICAL DATA:  Altered mental status. EXAM: CT HEAD  WITHOUT CONTRAST TECHNIQUE: Contiguous axial images were obtained from the base of the skull through the vertex without intravenous contrast. COMPARISON:  Jul 10, 2018 FINDINGS: Brain: There is mild cerebral atrophy with widening of the extra-axial spaces and ventricular dilatation. There are areas of decreased attenuation within the white matter tracts of the supratentorial brain, consistent with microvascular disease changes. Vascular: No hyperdense vessel or unexpected calcification. Skull: Normal. Negative for fracture or focal lesion. Sinuses/Orbits: No acute finding. Other: None. IMPRESSION: 1. Mild generalized cerebral atrophy. 2. No acute intracranial abnormality. Electronically Signed   By: Virgina Norfolk M.D.   On: 07/17/2019 22:51    Procedures Procedures (including critical care time)  Medications Ordered in ED Medications  cefTRIAXone (ROCEPHIN) 1 g in sodium chloride 0.9 % 100 mL IVPB (0 g Intravenous Stopped 07/17/19 2249)  acetaminophen (TYLENOL) tablet 1,000 mg (1,000 mg Oral Given 07/17/19 2207)  lactated ringers bolus 1,000 mL (0 mLs Intravenous Stopped 07/17/19 2249)    And  lactated ringers bolus 1,000 mL (0 mLs Intravenous Stopped 07/17/19 2249)    And  lactated ringers bolus 500 mL (0 mLs  Intravenous Stopped 07/17/19 2313)    ED Course  I have reviewed the triage vital signs and the nursing notes.  Pertinent labs & imaging results that were available during my care of the patient were reviewed by me and considered in my medical decision making (see chart for details).    MDM Rules/Calculators/A&P                     CRITICAL CARE Performed by: Isla Pence   Total critical care time: 30 minutes  Critical care time was exclusive of separately billable procedures and treating other patients.  Critical care was necessary to treat or prevent imminent or life-threatening deterioration.  Critical care was time spent personally by me on the following activities:  development of treatment plan with patient and/or surrogate as well as nursing, discussions with consultants, evaluation of patient's response to treatment, examination of patient, obtaining history from patient or surrogate, ordering and performing treatments and interventions, ordering and review of laboratory studies, ordering and review of radiographic studies, pulse oximetry and re-evaluation of patient's condition.  Sepsis fluids started, but stopped after CXR showed pulmonary edema.  Pt also put on 2L oxygen.  Pt did have Covid in May of last year.  She has had both doses of her Covid vaccine.  Pt given IV rocephin.  Pt signed out to Dr. Leonette Monarch at shift change.     Final Clinical Impression(s) / ED Diagnoses Final diagnoses:  Fever, unspecified fever cause  Sepsis, due to unspecified organism, unspecified whether acute organ dysfunction present [facility])  Acute pulmonary edema [facility])    Rx / DC Orders ED Discharge Orders    None       Isla Pence, MD 07/17/19 2344

## 2019-07-18 ENCOUNTER — Inpatient Hospital Stay (HOSPITAL_COMMUNITY): Payer: Medicare Other

## 2019-07-18 DIAGNOSIS — A419 Sepsis, unspecified organism: Secondary | ICD-10-CM | POA: Diagnosis not present

## 2019-07-18 DIAGNOSIS — R7881 Bacteremia: Secondary | ICD-10-CM

## 2019-07-18 DIAGNOSIS — R0600 Dyspnea, unspecified: Secondary | ICD-10-CM | POA: Insufficient documentation

## 2019-07-18 DIAGNOSIS — E785 Hyperlipidemia, unspecified: Secondary | ICD-10-CM | POA: Diagnosis present

## 2019-07-18 DIAGNOSIS — Z953 Presence of xenogenic heart valve: Secondary | ICD-10-CM | POA: Diagnosis not present

## 2019-07-18 DIAGNOSIS — I38 Endocarditis, valve unspecified: Secondary | ICD-10-CM | POA: Diagnosis not present

## 2019-07-18 DIAGNOSIS — T827XXS Infection and inflammatory reaction due to other cardiac and vascular devices, implants and grafts, sequela: Secondary | ICD-10-CM | POA: Diagnosis not present

## 2019-07-18 DIAGNOSIS — M810 Age-related osteoporosis without current pathological fracture: Secondary | ICD-10-CM | POA: Diagnosis present

## 2019-07-18 DIAGNOSIS — L89154 Pressure ulcer of sacral region, stage 4: Secondary | ICD-10-CM | POA: Insufficient documentation

## 2019-07-18 DIAGNOSIS — R4182 Altered mental status, unspecified: Secondary | ICD-10-CM | POA: Diagnosis not present

## 2019-07-18 DIAGNOSIS — E872 Acidosis: Secondary | ICD-10-CM | POA: Diagnosis present

## 2019-07-18 DIAGNOSIS — G9341 Metabolic encephalopathy: Secondary | ICD-10-CM | POA: Insufficient documentation

## 2019-07-18 DIAGNOSIS — R32 Unspecified urinary incontinence: Secondary | ICD-10-CM | POA: Diagnosis present

## 2019-07-18 DIAGNOSIS — N1832 Chronic kidney disease, stage 3b: Secondary | ICD-10-CM | POA: Diagnosis present

## 2019-07-18 DIAGNOSIS — E1165 Type 2 diabetes mellitus with hyperglycemia: Secondary | ICD-10-CM | POA: Diagnosis present

## 2019-07-18 DIAGNOSIS — M1990 Unspecified osteoarthritis, unspecified site: Secondary | ICD-10-CM | POA: Diagnosis present

## 2019-07-18 DIAGNOSIS — L89153 Pressure ulcer of sacral region, stage 3: Secondary | ICD-10-CM | POA: Diagnosis present

## 2019-07-18 DIAGNOSIS — Y831 Surgical operation with implant of artificial internal device as the cause of abnormal reaction of the patient, or of later complication, without mention of misadventure at the time of the procedure: Secondary | ICD-10-CM | POA: Diagnosis present

## 2019-07-18 DIAGNOSIS — I350 Nonrheumatic aortic (valve) stenosis: Secondary | ICD-10-CM | POA: Diagnosis not present

## 2019-07-18 DIAGNOSIS — I442 Atrioventricular block, complete: Secondary | ICD-10-CM | POA: Diagnosis present

## 2019-07-18 DIAGNOSIS — E1122 Type 2 diabetes mellitus with diabetic chronic kidney disease: Secondary | ICD-10-CM | POA: Diagnosis present

## 2019-07-18 DIAGNOSIS — B951 Streptococcus, group B, as the cause of diseases classified elsewhere: Secondary | ICD-10-CM | POA: Diagnosis present

## 2019-07-18 DIAGNOSIS — I130 Hypertensive heart and chronic kidney disease with heart failure and stage 1 through stage 4 chronic kidney disease, or unspecified chronic kidney disease: Secondary | ICD-10-CM | POA: Diagnosis present

## 2019-07-18 DIAGNOSIS — L89152 Pressure ulcer of sacral region, stage 2: Secondary | ICD-10-CM | POA: Diagnosis present

## 2019-07-18 DIAGNOSIS — I5032 Chronic diastolic (congestive) heart failure: Secondary | ICD-10-CM | POA: Diagnosis present

## 2019-07-18 DIAGNOSIS — Z8673 Personal history of transient ischemic attack (TIA), and cerebral infarction without residual deficits: Secondary | ICD-10-CM | POA: Diagnosis not present

## 2019-07-18 DIAGNOSIS — T827XXA Infection and inflammatory reaction due to other cardiac and vascular devices, implants and grafts, initial encounter: Secondary | ICD-10-CM | POA: Diagnosis present

## 2019-07-18 DIAGNOSIS — F329 Major depressive disorder, single episode, unspecified: Secondary | ICD-10-CM | POA: Diagnosis present

## 2019-07-18 DIAGNOSIS — I342 Nonrheumatic mitral (valve) stenosis: Secondary | ICD-10-CM | POA: Diagnosis not present

## 2019-07-18 DIAGNOSIS — Z8616 Personal history of COVID-19: Secondary | ICD-10-CM | POA: Diagnosis not present

## 2019-07-18 DIAGNOSIS — E1151 Type 2 diabetes mellitus with diabetic peripheral angiopathy without gangrene: Secondary | ICD-10-CM | POA: Diagnosis present

## 2019-07-18 DIAGNOSIS — R6520 Severe sepsis without septic shock: Secondary | ICD-10-CM | POA: Diagnosis not present

## 2019-07-18 DIAGNOSIS — Z950 Presence of cardiac pacemaker: Secondary | ICD-10-CM | POA: Diagnosis not present

## 2019-07-18 DIAGNOSIS — I2510 Atherosclerotic heart disease of native coronary artery without angina pectoris: Secondary | ICD-10-CM | POA: Diagnosis present

## 2019-07-18 DIAGNOSIS — A401 Sepsis due to streptococcus, group B: Secondary | ICD-10-CM | POA: Diagnosis present

## 2019-07-18 LAB — BLOOD CULTURE ID PANEL (REFLEXED)

## 2019-07-18 LAB — PROCALCITONIN: Procalcitonin: 27.49 ng/mL

## 2019-07-18 LAB — RESPIRATORY PANEL BY PCR

## 2019-07-18 LAB — BASIC METABOLIC PANEL
Anion gap: 14 (ref 5–15)
BUN: 23 mg/dL (ref 8–23)
CO2: 27 mmol/L (ref 22–32)
Calcium: 8.6 mg/dL — ABNORMAL LOW (ref 8.9–10.3)
Chloride: 98 mmol/L (ref 98–111)
Creatinine, Ser: 1.71 mg/dL — ABNORMAL HIGH (ref 0.44–1.00)
GFR calc Af Amer: 33 mL/min — ABNORMAL LOW (ref >60)
GFR calc non Af Amer: 29 mL/min — ABNORMAL LOW (ref >60)
Glucose, Bld: 305 mg/dL — ABNORMAL HIGH (ref 70–99)
Potassium: 3.8 mmol/L (ref 3.5–5.1)
Sodium: 139 mmol/L (ref 135–145)

## 2019-07-18 LAB — ECHOCARDIOGRAM COMPLETE
Height: 66 in
Weight: 2704 oz

## 2019-07-18 LAB — GLUCOSE, CAPILLARY
Glucose-Capillary: 257 mg/dL — ABNORMAL HIGH (ref 70–99)
Glucose-Capillary: 228 mg/dL — ABNORMAL HIGH (ref 70–99)
Glucose-Capillary: 189 mg/dL — ABNORMAL HIGH (ref 70–99)

## 2019-07-18 LAB — LACTIC ACID, PLASMA
Lactic Acid, Venous: 2.4 mmol/L (ref 0.5–1.9)
Lactic Acid, Venous: 2.3 mmol/L (ref 0.5–1.9)
Lactic Acid, Venous: 2.1 mmol/L (ref 0.5–1.9)
Lactic Acid, Venous: 2.3 mmol/L (ref 0.5–1.9)

## 2019-07-18 LAB — CBG MONITORING, ED: Glucose-Capillary: 306 mg/dL — ABNORMAL HIGH (ref 70–99)

## 2019-07-18 LAB — BRAIN NATRIURETIC PEPTIDE: B Natriuretic Peptide: 424.2 pg/mL — ABNORMAL HIGH (ref 0.0–100.0)

## 2019-07-18 MED ORDER — SODIUM CHLORIDE 0.9 % IV SOLN
2.0000 g | INTRAVENOUS | Status: DC
  Filled 2019-07-18: qty 20

## 2019-07-18 MED ORDER — ATORVASTATIN CALCIUM 80 MG PO TABS
80.0000 mg | ORAL_TABLET | Freq: Every day | ORAL | Status: DC
  Administered 2019-07-18 – 2019-07-21 (×4): 80 mg via ORAL
  Filled 2019-07-18 (×4): qty 1

## 2019-07-18 MED ORDER — INSULIN DETEMIR 100 UNIT/ML ~~LOC~~ SOLN
8.0000 [IU] | Freq: Every day | SUBCUTANEOUS | Status: DC
  Filled 2019-07-18: qty 0.08

## 2019-07-18 MED ORDER — AZITHROMYCIN 250 MG PO TABS
500.0000 mg | ORAL_TABLET | Freq: Every day | ORAL | Status: DC
  Administered 2019-07-18: 500 mg via ORAL
  Filled 2019-07-18 (×2): qty 2

## 2019-07-18 MED ORDER — GERHARDT'S BUTT CREAM
TOPICAL_CREAM | Freq: Three times a day (TID) | CUTANEOUS | Status: DC
  Administered 2019-07-19: 1 via TOPICAL
  Filled 2019-07-18 (×2): qty 1

## 2019-07-18 MED ORDER — ASPIRIN EC 81 MG PO TBEC
81.0000 mg | DELAYED_RELEASE_TABLET | Freq: Every day | ORAL | Status: DC
  Administered 2019-07-18 – 2019-07-21 (×3): 81 mg via ORAL
  Filled 2019-07-18 (×4): qty 1

## 2019-07-18 MED ORDER — INSULIN ASPART 100 UNIT/ML ~~LOC~~ SOLN
0.0000 [IU] | Freq: Three times a day (TID) | SUBCUTANEOUS | Status: DC
  Administered 2019-07-18: 4 [IU] via SUBCUTANEOUS
  Administered 2019-07-18: 2 [IU] via SUBCUTANEOUS
  Administered 2019-07-18: 3 [IU] via SUBCUTANEOUS
  Administered 2019-07-19: 2 [IU] via SUBCUTANEOUS

## 2019-07-18 MED ORDER — ACETAMINOPHEN 325 MG PO TABS
650.0000 mg | ORAL_TABLET | Freq: Four times a day (QID) | ORAL | Status: DC | PRN
  Administered 2019-07-18 – 2019-07-21 (×5): 650 mg via ORAL
  Filled 2019-07-18 (×6): qty 2

## 2019-07-18 MED ORDER — SODIUM CHLORIDE 0.9 % IV SOLN: 1.0000 g | INTRAVENOUS | Status: DC

## 2019-07-18 MED ORDER — CLOPIDOGREL BISULFATE 75 MG PO TABS
75.0000 mg | ORAL_TABLET | Freq: Every day | ORAL | Status: DC
  Administered 2019-07-18 – 2019-07-21 (×3): 75 mg via ORAL
  Filled 2019-07-18 (×4): qty 1

## 2019-07-18 MED ORDER — ENOXAPARIN SODIUM 30 MG/0.3ML ~~LOC~~ SOLN
30.0000 mg | Freq: Every day | SUBCUTANEOUS | Status: DC
  Administered 2019-07-18 – 2019-07-19 (×2): 30 mg via SUBCUTANEOUS
  Filled 2019-07-18 (×2): qty 0.3

## 2019-07-18 MED ORDER — VANCOMYCIN HCL IN DEXTROSE 1-5 GM/200ML-% IV SOLN: 1000.0000 mg | INTRAVENOUS | Status: DC

## 2019-07-18 MED ORDER — INSULIN DETEMIR 100 UNIT/ML ~~LOC~~ SOLN
8.0000 [IU] | Freq: Every day | SUBCUTANEOUS | Status: DC
  Administered 2019-07-19: 8 [IU] via SUBCUTANEOUS
  Filled 2019-07-18 (×2): qty 0.08

## 2019-07-18 MED ORDER — LACTATED RINGERS IV SOLN: INTRAVENOUS | Status: AC

## 2019-07-18 MED ORDER — INSULIN DETEMIR 100 UNIT/ML ~~LOC~~ SOLN
8.0000 [IU] | Freq: Once | SUBCUTANEOUS | Status: AC
  Administered 2019-07-18: 8 [IU] via SUBCUTANEOUS
  Filled 2019-07-18: qty 0.08

## 2019-07-18 MED ORDER — VANCOMYCIN HCL 1500 MG/300ML IV SOLN
1500.0000 mg | Freq: Once | INTRAVENOUS | Status: DC
  Filled 2019-07-18: qty 300

## 2019-07-18 MED ORDER — CEFAZOLIN SODIUM-DEXTROSE 2-4 GM/100ML-% IV SOLN
2.0000 g | Freq: Three times a day (TID) | INTRAVENOUS | Status: DC
  Administered 2019-07-18 – 2019-07-21 (×7): 2 g via INTRAVENOUS
  Filled 2019-07-18 (×7): qty 100

## 2019-07-18 NOTE — ED Notes (Signed)
Tele  Breakfast Ordered 

## 2019-07-18 NOTE — ED Notes (Signed)
Pt received IV abx prior to second cultures being drawn.

## 2019-07-18 NOTE — Progress Notes (Signed)
Pharmacy Antibiotic Note  [name] is a 74 y.o. female admitted on 07/17/2019 with bacteremia.  Pharmacy has been consulted for vancomycin dosing for GPCs in pairs in 2/2 bottles. WBC 18.9. PCT 27.49. LA 2.3. SCr 1.7   Of note, patient is also on ceftriaxone and azithromycin   Plan: -Vancomycin 1500 mg IV once, then start vancomycin 1 gm IV Q 24 hours -Monitor CBC, renal fx, cultures and clinical progress -VT at SS   Height: $Remove'5\' 6"'EHVxVKy$  (167.6 cm) Weight: 76.7 kg (169 lb) IBW/kg (Calculated) : 59.3  Temp (24hrs), Avg:101.7 F (38.7 C), Min:98.7 F (37.1 C), Max:105.1 F (40.6 C)  Recent Labs  Lab 07/17/19 2150 07/18/19 0217 07/18/19 0246 07/18/19 0645  WBC 18.9*  --   --   --   CREATININE 1.65*  --  1.71*  --   LATICACIDVEN 2.3* 2.4*  --  2.3*    Estimated Creatinine Clearance: 29.8 mL/min (A) (by C-G formula based on SCr of 1.71 mg/dL (H)).    Allergies  Allergen Reactions  . 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," as the patient is a Jehovah's Witness    Antimicrobials this admission: Vancomycin 5/24 >>  Ceftriaxone 5/24 >>  Azithromycin 5/24 >>  Dose adjustments this admission:  Microbiology results: 5/23 BCx: 2/2 GPC in chains   Thank you for allowing pharmacy to be a part of this patient's care.  Albertina Parr, PharmD., BCPS, BCCCP Clinical Pharmacist Clinical phone for 07/18/19 until 3:30pm: [phone] If after 3:30pm, please refer to [facility] for unit-specific pharmacist

## 2019-07-18 NOTE — Progress Notes (Signed)
Pharmacy Antibiotic Note  [name] is a 74 y.o. female admitted on 07/17/2019 with bacteremia.  Pharmacy has been consulted for cefazolin dosing for bacteremia.   Patient renal function is borderline for q8 vs q12 hour dosing. Will dose more aggressively given blood stream infection. Will check bmet in am, if scr trending up will adjust accordingly.   Plan: -Cefazolin 2g q8 hours -Bmet in am  Height: $Remove'5\' 6"'WdYXRGU$  (167.6 cm) Weight: 76.7 kg (169 lb) IBW/kg (Calculated) : 59.3  Temp (24hrs), Avg:100.1 F (37.8 C), Min:97.9 F (36.6 C), Max:105.1 F (40.6 C)  Recent Labs  Lab 07/17/19 2150 07/18/19 0217 07/18/19 0246 07/18/19 0645 07/18/19 1117 07/18/19 1336  WBC 18.9*  --   --   --   --   --   CREATININE 1.65*  --  1.71*  --   --   --   LATICACIDVEN 2.3* 2.4*  --  2.3* 2.1* 2.3*    Estimated Creatinine Clearance: 29.8 mL/min (A) (by C-G formula based on SCr of 1.71 mg/dL (H)).    Allergies  Allergen Reactions  . 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," as the patient is a Jehovah's Witness    Antimicrobials this admission: Vancomycin 5/24 Ceftriaxone 5/24   Azithromycin 5/24 Cefazolin 5/24>>  Dose adjustments this admission:  Microbiology results: 5/23 BCx: 2/2 GPC in chains>strep on bcid   Thank you for allowing pharmacy to be a part of this patient's care.  Erin Hearing PharmD., BCPS Clinical Pharmacist 07/18/2019 6:41 PM

## 2019-07-18 NOTE — ED Notes (Signed)
Attempted to call nursing report to 2w

## 2019-07-18 NOTE — Progress Notes (Signed)
Orthopedic Tech Progress Note Patient Details:  [name] 1945/09/24 [id number]  Ortho Devices Type of Ortho Device: Haematologist Ortho Device/Splint Location: BLE Ortho Device/Splint Interventions: Ordered, Application   Post Interventions Patient Tolerated: Well Instructions Provided: [facility] of [place] 07/18/2019, 9:14 AM

## 2019-07-18 NOTE — Progress Notes (Signed)
PHARMACY - PHYSICIAN COMMUNICATION CRITICAL VALUE ALERT - BLOOD CULTURE IDENTIFICATION (BCID)  [name] is an 74 y.o. female who presented to [facility] on 07/17/2019 with a chief complaint of confusion and generalized weakness and concern for PNA/sepsis.  Assessment:  38 YOF with noted history of porcine AVR, pacemaker now noted to have both bottles of one set growing streptococcus agalactiae (group B strep). Given the patient's history - will likely require TTE/TEE to rule out endocarditis, and pacemaker evaluation. The patient is currently on Rocephin + Azithromycin - CXR is noted to be more consistent with edema/HF.  Name of physician (or Provider) Contacted: Nanetta Batty and Rylee Panama  Current antibiotics: Rocephin + Azithromycin   Changes to prescribed antibiotics recommended:  Narrow to Cefazolin 2g IV every 12 hours (reduced for renal function, avoid PCN G due to HF and Na content).   Team desired to continue with Rocephin + Azithromycin for now given concurrent concerns for PNA.   Results for orders placed or performed during the hospital encounter of 07/17/19  Blood Culture ID Panel (Reflexed) (Collected: 07/17/2019  9:50 PM)  Result Value Ref Range   Enterococcus species NOT DETECTED NOT DETECTED   Listeria monocytogenes NOT DETECTED NOT DETECTED   Staphylococcus species NOT DETECTED NOT DETECTED   Staphylococcus aureus (BCID) NOT DETECTED NOT DETECTED   Streptococcus species DETECTED (A) NOT DETECTED   Streptococcus agalactiae DETECTED (A) NOT DETECTED   Streptococcus pneumoniae NOT DETECTED NOT DETECTED   Streptococcus pyogenes NOT DETECTED NOT DETECTED   Acinetobacter baumannii NOT DETECTED NOT DETECTED   Enterobacteriaceae species NOT DETECTED NOT DETECTED   Enterobacter cloacae complex NOT DETECTED NOT DETECTED   Escherichia coli NOT DETECTED NOT DETECTED   Klebsiella oxytoca NOT DETECTED NOT DETECTED   Klebsiella pneumoniae NOT DETECTED NOT DETECTED   Proteus species NOT DETECTED NOT DETECTED   Serratia marcescens NOT DETECTED NOT DETECTED   Haemophilus influenzae NOT DETECTED NOT DETECTED   Neisseria meningitidis NOT DETECTED NOT DETECTED   Pseudomonas aeruginosa NOT DETECTED NOT DETECTED   Candida albicans NOT DETECTED NOT DETECTED   Candida glabrata NOT DETECTED NOT DETECTED   Candida krusei NOT DETECTED NOT DETECTED   Candida parapsilosis NOT DETECTED NOT DETECTED   Candida tropicalis NOT DETECTED NOT DETECTED    Thank you for allowing pharmacy to be a part of this patient's care.  Alycia Rossetti, PharmD, BCPS Clinical Pharmacist Clinical phone for 07/18/2019: [phone] 07/18/2019 11:57 AM   **Pharmacist phone directory can now be found on [email or web address] ([email or web address]).  Listed under [place].

## 2019-07-18 NOTE — ED Provider Notes (Signed)
I assumed care of this patient.  Please see previous provider note for further details of Hx, PE.  Briefly patient is a 74 y.o. female who presented who presented with altered mental status found to be febrile.  Code sepsis initiated.  Chest x-ray suspicious for pulmonary edema versus pneumonia.  Currently pending UA. Plan for admission  UA negative.   admitted to Internal medicine  .Critical Care Performed by: Fatima Blank, MD Authorized by: Fatima Blank, MD    CRITICAL CARE Performed by: Grayce Sessions Philana Younis Total critical care time: 30 minutes Critical care time was exclusive of separately billable procedures and treating other patients. Critical care was necessary to treat or prevent imminent or life-threatening deterioration. Critical care was time spent personally by me on the following activities: development of treatment plan with patient and/or surrogate as well as nursing, discussions with consultants, evaluation of patient's response to treatment, examination of patient, obtaining history from patient or surrogate, ordering and performing treatments and interventions, ordering and review of laboratory studies, ordering and review of radiographic studies, pulse oximetry and re-evaluation of patient's condition.         Fatima Blank, MD 07/18/19 [phone]

## 2019-07-18 NOTE — Progress Notes (Signed)
Echocardiogram 2D Echocardiogram has been performed.  Oneal Deputy Neyla Gauntt 07/18/2019, 2:25 PM

## 2019-07-18 NOTE — Progress Notes (Signed)
EEG completed, results pending. 

## 2019-07-18 NOTE — ED Notes (Signed)
Family at bedside. 

## 2019-07-18 NOTE — H&P (Signed)
Date: 07/18/2019               Patient Name:  [name] MRN: [id number]  DOB: 09/25/1945 Age / Sex: 74 y.o., female   PCP: [name], MD         Medical Service: Internal Medicine Teaching Service         Attending Physician: Dr. Lucious Groves, DO    First Contact: Dr. Madilyn Fireman Pager: [phone]  Second Contact: Dr. Sherry Ruffing Pager: [phone]       After Hours (After 5p/  First Contact Pager: [phone]  weekends / holidays): Second Contact Pager: [phone]   Chief Complaint: AMS and weakness  History of Present Illness: [name] is a 74 year old female with a significant PMhx which is not limited to but includes  HTN, HLD, IDDM, aortic stenosis s/p replacement with porcine valve 2006 ,CKD stage III, and HFpEF who presents for progressive confusion over the past three days and generalized weakness. Per patient she began to feel off and weak a few days prior but denied associated symptoms. She stated that her granddaughter became concerned today as she herself could not recall specific information. She denied known fever, chills, cough, shortness of breath, headache, vision changes, focal weakness, abdominal pain, chest pain, palpitations, loose stool, constipation, dysuria, hematuria, rash, joint pain or muscle pain.   In the ER, patient was noted to be febrile and code sepsis initiated. She received 3 L IV fluids.   Meds:  Current Outpatient Medications  Medication Instructions  . Accu-Chek FastClix Lancets MISC Use to check blood sugar 4 times a day. diag code E11.22. insulin dependent  . acetaminophen (TYLENOL) 1,000 mg, Oral, Every 6 hours PRN  . amLODipine (NORVASC) 5 mg, Oral, Daily  . amoxicillin (AMOXIL) 500 MG tablet Take 4 capsules (2,000 mg) one hour prior to all dental visits.  Marland Kitchen aspirin 81 MG EC tablet TAKE 1 TABLET (81 MG TOTAL) BY MOUTH DAILY. SWALLOW WHOLE.  Marland Kitchen atorvastatin (LIPITOR) 80 mg, Oral, Daily at bedtime  . clopidogrel (PLAVIX) 75 mg,  Oral, Daily with breakfast  . cyanocobalamin ((VITAMIN B-12)) 1,000 mcg, Intramuscular, Weekly, For 4 weeks  . diclofenac Sodium (VOLTAREN) 2 g, Topical, 4 times daily  . furosemide (LASIX) 40 mg, Oral, 2 times daily  . glucose blood (CONTOUR NEXT TEST) test strip Use to check blood sugar 4 times daily. diag code E11.22. insulin dependent  . hydrALAZINE (APRESOLINE) 50 mg, Oral, 3 times daily  . Insulin Degludec-Liraglutide (XULTOPHY) 100-3.6 UNIT-MG/ML SOPN 10 Units, Subcutaneous, Daily  . Insulin Pen Needle (EASY TOUCH PEN NEEDLES) 31G X 5 MM MISC To inject insulin into the skin. INSULIN DEPENDENT. DIAG CODE E11.9  . metoprolol succinate (TOPROL-XL) 25 mg, Oral, Daily  . PARoxetine (PAXIL) 30 MG tablet TAKE 1 TABLET BY MOUTH DAILY  . polyethylene glycol (MIRALAX / GLYCOLAX) 17 g, Oral, Daily  . spironolactone (ALDACTONE) 12.5 mg, Oral, Daily   Allergies: Allergies as of 07/17/2019 - Review Complete 07/17/2019  Allergen Reaction Noted  . Other Other (See Comments) 07/21/2017   Past Medical History:  Diagnosis Date  . Acute on chronic diastolic CHF (congestive heart failure) ([place]) 06/22/2017  . Acute on chronic renal failure ([place]) 05/25/2011  . Adenocarcinoma of breast ([place]) 1996   Completed tamoxifen and had mastectomy.  . Aortic stenosis, severe    s/p aortic valve replacement with porcine valve 06/2004.  ECHO 2010 EF 37%, LVH, diastolic dysfxn, Bioprostetic aoritc valve, mild  AS. ECHO 2013 EF 60%, Nl aortic artificial valve, dynamic obstruction in the outflow tract   Class IIb rec for annual TTE after 5 yrs. She had a TTE 2013    . Arthritis   . CAD (coronary artery disease) 2006   s/p CABG (5/06) w/ saphenous vein to RCA at time of AVR  . CKD (chronic kidney disease) stage 2, GFR 60-89 ml/min 06/03/2011   She is no longer taking NSAIDs.   Marland Kitchen CVA (cerebral infarction) 2006   Post-op from AVR. Presumed embolic in nature. Carotid stenosis of R 60-79%. Repeat dopplers 4/10 no R stenosis and  L stenosis of 1-29%.  . Depression    Controlled on Paxil  . Diabetes mellitus 1992   Dx 04/25/1990. Now insulin dependent, started 2008. On ACEI.   . Diverticulosis 2001  . History of 2019 novel coronavirus disease (COVID-19)   . Hyperlipidemia    Mgmt with a statin  . Hypertension    Requires 4 drug tx  . Osteoporosis 2006   DEXA 10/06 : L femur T -2.8, R -2.7. Lumbar T -2.4. On bisphosphonates and  Calcium / Vit D.  . Presence of permanent cardiac pacemaker   . Refusal of blood transfusions as patient is Jehovah's Witness   . Stroke [facility])    Family History:  Family History  Problem Relation Age of Onset  . Diabetes Mother   . Hypertension Mother   . Alzheimer's disease Mother   . Heart disease Father 45       AMI at age 42 and 74  . Mental illness Sister   . Heart disease Sister 25       AMI  . Kidney disease Sister    Social History:  Social History   Tobacco Use  . Smoking status: Never Smoker  . Smokeless tobacco: Never Used  Substance Use Topics  . Alcohol use: Yes    Alcohol/week: 0.0 standard drinks    Comment: Occasional beer, monthly  . Drug use: No   Review of Systems: A complete ROS was negative except as per HPI.   Physical Exam: Blood pressure (!) 121/59, pulse 78, temperature 100 F (37.8 C), temperature source Oral, resp. rate 19, height $RemoveBe'5\' 6"'zEggHADtq$  (1.676 m), weight 76.7 kg, SpO2 100 %.   General: NAD, nl appearance HE: Normocephalic, atraumatic , EOMI, Conjunctivae normal ENT: No congestion, no rhinorrhea, no exudate or erythema , neck supple Cardiovascular: Normal rate, regular rhythm.  Harsh systolic murmur , no rubs, or gallops Pulmonary : Effort normal, breath sounds normal. No wheezes, rales, or rhonchi Abdominal: soft, nontender,  bowel sounds present Musculoskeletal: no swelling , deformity, injury ,or tenderness in extremities, Skin: Warm, dry , two sacral stage 2 ulcers  Psychiatric/Behavioral:  normal mood, normal behavior  Neuro: Alert  and oriented to person, place, (year , but not month), and situation . No gross focal deficits.    EKG: personally reviewed my interpretation is atrial sensed ventricular paced, regular rate   CXR: personally reviewed my interpretation is bilateral  Opacities, no pneumothorax or pulmonary effusion.  Assessment & Plan by Problem: Principal Problem:   Acute encephalopathy Active Problems:   DM (diabetes mellitus) (HCC)   CAD (coronary artery disease)   Bilateral lower extremity edema   Chronic diastolic congestive heart failure (HCC)   Sacral decubitus ulcer, stage II ([place])  74 year old female with a significant PMhx which is not limited to but includes  HTN, HLD, IDDM, aortic stenosis s/p replacement with  porcine valve 2006 ,CKD stage III, and HFpEF who presents for progressive confusion over the past three days and generalized weakness.  Acute Encephalopathy Patient presents with altered mental status and noted by family to be sluggish for 2 days prior. Patient is alert and oriented to person place, year, but not the month ( stated December). She presented with a fever to 105. Most notable labs as her white blood cell count of 18.9 with left shift.  Patient has possible pneumonia vs pulmonary congestion on chest x-ray. Will consider CT chest if patient is not showing clinical improvement. Patient has no subjective symptoms, denies cough, shortness of breath, sputum production.  UA negative.  Neck is supple , so holding off on an LP at this time.  However should consider LP if patient does not show clinical improvement.  Patient has some decubitus ulcers, but they do not look infected .It is possible patient could have had an unwitnessed seizure with fever and increased white count, so will get a EEG. CT Head showed chronic small vessel disease but no acute changes.  Plan: -Trend CBC -EEG -CAP coverage with azithromycin and ceftriaxone -Trend lactic acid  Chronic heart failure Patient has  chronic diastolic heart failure, EF 65% on echo 04/22/2019.Marland Kitchen  BNP is over 424 but this is stable compared to previous BMPs.  Her weight in clinic was 171, and she presents with a weight of 169 today.  On exam she does not appear hypervolemic on exam.  Plan: -Strict ins and outs - Daily weights -Continue metoprolol  Type 2 diabetes mellitus Last hemoglobin A1c 8.6 05/19/2019.  Outpatient medications include the following: degludec-liraglutide 10 units daily.  Plan: -Levemir 8 units QHS -Sliding scale - very sensitive -CBG monitoring  Hypertension Chronic with history of severe exacerbations.  Current medications are Hydrea 50 3 times daily, amlodipine 5 daily, metoprolol 25 daily. Plan: -Holding home antihypertensives in setting of possible infection and normotensive on exam  Sacral decubitus ulcers 2 sacral dubitus ulcers, stage 2, no drainage., ulcers do not appear infected.  - Consult wound care   Code: full Diet: Heart healthy/carb modified VTE prophylaxis: Lovenox Dispo: Admit patient to Inpatient with expected length of stay greater than 2 midnights.    Signed: Tamsen Snider, MD PGY1

## 2019-07-18 NOTE — Procedures (Addendum)
Patient Name: [name]  MRN: [id number]  Epilepsy Attending: Lora Havens  Referring Physician/Provider: Dr. Kathi Ludwig Date: 07/18/2019 Duration: 24.58 minutes  Patient history: 74 year old female with altered mental status.  EEG evaluate for seizures.  Level of alertness: Awake, asleep  AEDs during EEG study: None  Technical aspects: This EEG study was done with scalp electrodes positioned according to the 10-20 International system of electrode placement. Electrical activity was acquired at a sampling rate of $Remov'500Hz'rfpOPy$  and reviewed with a high frequency filter of $RemoveB'70Hz'MPqUpvmp$  and a low frequency filter of $RemoveB'1Hz'pJvLSsfj$ . EEG data were recorded continuously and digitally stored.   Description: The posterior dominant rhythm consists of 8-9 Hz activity of moderate voltage (25-35 uV) seen predominantly in posterior head regions, symmetric and reactive to eye opening and eye closing. Sleep was characterized by vertex waves, sleep spindles (12 to 14 Hz), maximal frontocentral region.  EEG showed intermittent generalized 3 to 6 Hz theta-delta slowing  Physiology photic driving was not seen during photic stimulation.  Hyperventilation was not performed.     ABNORMALITY -Intermittent slow, generalized  IMPRESSION: This study is suggestive of mild diffuse encephalopathy, nonspecific etiology. No seizures or epileptiform discharges were seen throughout the recording.   Elisah Parmer Barbra Sarks

## 2019-07-18 NOTE — Consult Note (Signed)
WOC Nurse Consult Note: Reason for Consult: Bilateral Ischial tuberosity pressure injuries (Stage 3) Wound type: Pressure Pressure Injury POA: Yes Measurement: 1.5cm round x 0.3cm  Wound bed: Pale pink, moist, white/grey maceration in the immediate periwound Drainage (amount, consistency, odor): Scant serous Periwound: As noted above, macerated from urinary incontinence Dressing procedure/placement/frequency: I have provided Nursing with guidance for the care of this patient's integumentary system: a pressure redistribution chair pad, InterDry wicking antimicrobial textile to manage the intertriginous dermatitis, topical care with Gerhart's Butt Cream (a compounded 1:1:1 zinc oxide:lotrimin:hydrocortisone cream product) and turning and repositioning guidance. The patient wears bilateral Unna's boots for management of edema; she has no wounds. I will have [employer] apply those and maintain with once weekly changes while in house. Patient is using an external urinary incontinence device (PurWick) to manage her urinary incontinence.  [facility] nursing team will not follow, but will remain available to this patient, the nursing and medical teams.  Please re-consult if needed. Thanks, Maudie Flakes, MSN, RN, [place], Arther Abbott  Pager# [phone]

## 2019-07-18 NOTE — ED Notes (Signed)
EEG at bedside.

## 2019-07-18 NOTE — Progress Notes (Signed)
Brief History:  [name] is a 74 year old female with a past medical history significant for hypertension, hyperlipidemia, insulin-dependent diabetes mellitus, aortic stenosis s/p replacement with porcine valve 2006 ,chronic kidney disease stage III, and HFpEF who presented to the emergency department with a 3 day history of progressive confusion and generalized weakness.  Subjective:  [name] was evaluated and examined at bedside this morning. Patient was alert and oriented to name and place, but was unable to accurately recall the current year or month. Patient was able to recite the months December through September in reverse order. She states that she feels a general weakness when she tries to move or get up from sitting. She denies any fevers, chills, cough, sputum production, recent sick history, sick contacts, headache, neck stiffness, or chest pain.  Objective:  Vital signs in last 24 hours: Vitals:   07/18/19 0800 07/18/19 0815 07/18/19 0830 07/18/19 0845  BP: (!) 146/76 133/75 138/70 (!) 138/57  Pulse: 73 71 73 71  Resp: $Remo'17 17 15 19  'zPCwP$ Temp:      TempSrc:      SpO2: 97% 98% 97% 98%  Weight:      Height:       Weight change:   Intake/Output Summary (Last 24 hours) at 07/18/2019 0910 Last data filed at 07/17/2019 2249 Gross per 24 hour  Intake 2000 ml  Output --  Net 2000 ml   Physical Exam  Constitutional: No distress.  HENT:  Head: Normocephalic and atraumatic.  Eyes: EOM are normal.  Cardiovascular: Normal rate and regular rhythm.  Murmur heard. Harsh holosystolic murmur. Mild bilateral thigh edema.  Pulmonary/Chest: Effort normal. No respiratory distress. She has no wheezes.  Crackles appreciated in right lower lung.  Musculoskeletal:     Cervical back: Normal range of motion and neck supple.  Neurological: She is alert.  Oriented to name and place, but not year or month.  Skin: Skin is warm and dry. No erythema.  Sacral decubitus ulceration  approximately 1.5 cm x .5 cm in size with scant serous exudate.  Psychiatric: Mood and affect normal.      Assessment/Plan:  Principal Problem:   Acute encephalopathy Active Problems:   DM (diabetes mellitus) ([place])   CAD (coronary artery disease)   Bilateral lower extremity edema   Chronic diastolic congestive heart failure ([place])   Sacral decubitus ulcer, stage II (HCC)   [name] is a 74 year old female with a past medical history significant for hypertension, hyperlipidemia, insulin-dependent diabetes mellitus, aortic stenosis s/p replacement with porcine valve 2006 ,chronic kidney disease stage III, and HFpEF who presented to the emergency department with a 3 day history of progressive confusion and generalized weakness.   # Acute Encephalopathy Patient presented to the emergency department with altered mental status and generalized weakness. She is alerted to name and place, but unable to provide correct year or month. She presented to the emergency department with a fever of 105, that has since regressed to 98.7. CBC revealed a WBC count of 18.9 with a left shift. Lactic acid elevated at 2.4. Procalcitonin is 27.49. Blood cultures positive for gram positive cocci in chains. Urinalysis was negative for WBC and bacteria. Chest xray obtained in the emergency department illustrated interstitial opacities indicating edema vs. infection. Patient denies any cough, sputum production, fever, chills, sick contacts, or chest pain. Neck supple, patient denies neck stiffness or headache. Assessment: Given imaging findings and leukocytosis with left shift on CBC, the etiology of this  acute AMS with generalized weakness is likely infectious in nature. CAP coverage was initiated on admission. Initial fever on presentation, leukocytosis, significantly elevated procalcitonin, elevated lactic acidosis, and positive blood cultures for bacteremia is concerning for sepsis. Urinalysis negative for signs  of UTI, and patient does not report any genitourinary symptoms making UTI unlikely. Lack of headache and nuchal rigidity, and patient's temperature improvement is not overtly concerning for meningitis. Patients sacral ulcerations do not appear to be acutely infected, therefore are not likely underlying cause of patients acute presentation. EEG obtained to assess if patient potentially had seizure causing fever, leukocytosis, and altered mental status.  Plan: - Repeat CBC. Evaluate WBC count. - Continue azithromycin and ceftriaxone. Awaiting [employer] results. - Awaiting results of EEG. - Repeat Lactic Acid. - ECHO in setting of bacteremia with medical history of valvular disease and replacement.  # Chronic heart failure Patient has chronic diastolic heart failure, EF 65% on echo 04/22/2019.  BNP obtained on admission was 424, which Is consistent with BNP from 3 weeks (430) and 4 months (408) ago.  She presented 2 lbs below her last clinic weight. Some mild bilateral edema in the thighs. Plan: - Strict ins and outs - Daily weights - Continue metoprolol - Per orthopedic note, unna boot was ordered to aid in prevention of peripheral edema.  # Type 2 diabetes mellitus Last hemoglobin A1c 8.6 05/19/2019.  Outpatient medications include the following: degludec-liraglutide 10 units daily. Patient's glucose was 306 at 0824. Was given novolog at 0831. Plan: - One time does of Levemir 8 units for hyperglycemia this morning. - Continue Levemir 8 units QHS. - Continue sliding scale insulin - very sensitive. - Continue to monitor CBG.  # Hypertension Chronic with history of severe exacerbations.  Current medications are Hydrea 50 3 times daily, amlodipine 5 daily, metoprolol 25 daily. Plan: - Continue holding outpatient antihypertensives in setting of possible infection and patient's BP remains normotensive.   #Sacral decubitus ulcers 2 sacral dubitus ulcers, stage 2, no drainage., ulcers do not  appear infected.  - Wound care per wound care nurse recommendations.  Code: Full code Diet: Heart healthy/carb modified VTE prophylaxis: Lovenox   LOS: 0 days   Nanetta Batty, Medical Student 07/18/2019, 9:10 AM

## 2019-07-18 NOTE — Hospital Course (Addendum)
GBS sepsis -VSS, afebrile >24h -cefazolin  2. Persistent LA. Unclear etiology  3. AKI. Pre-renal vs ATN.

## 2019-07-19 DIAGNOSIS — E1121 Type 2 diabetes mellitus with diabetic nephropathy: Secondary | ICD-10-CM

## 2019-07-19 DIAGNOSIS — N1832 Chronic kidney disease, stage 3b: Secondary | ICD-10-CM

## 2019-07-19 DIAGNOSIS — I5032 Chronic diastolic (congestive) heart failure: Secondary | ICD-10-CM

## 2019-07-19 DIAGNOSIS — T827XXA Infection and inflammatory reaction due to other cardiac and vascular devices, implants and grafts, initial encounter: Principal | ICD-10-CM

## 2019-07-19 DIAGNOSIS — I2510 Atherosclerotic heart disease of native coronary artery without angina pectoris: Secondary | ICD-10-CM

## 2019-07-19 DIAGNOSIS — Z794 Long term (current) use of insulin: Secondary | ICD-10-CM

## 2019-07-19 DIAGNOSIS — A419 Sepsis, unspecified organism: Secondary | ICD-10-CM

## 2019-07-19 LAB — URINE CULTURE

## 2019-07-19 LAB — CBC
HCT: 32.2 % — ABNORMAL LOW (ref 36.0–46.0)
Hemoglobin: 10.3 g/dL — ABNORMAL LOW (ref 12.0–15.0)
MCH: 30 pg (ref 26.0–34.0)
MCHC: 32 g/dL (ref 30.0–36.0)
MCV: 93.9 fL (ref 80.0–100.0)
Platelets: 184 10*3/uL (ref 150–400)
RBC: 3.43 MIL/uL — ABNORMAL LOW (ref 3.87–5.11)
RDW: 13.9 % (ref 11.5–15.5)
WBC: 16.1 10*3/uL — ABNORMAL HIGH (ref 4.0–10.5)
nRBC: 0 % (ref 0.0–0.2)

## 2019-07-19 LAB — GLUCOSE, CAPILLARY
Glucose-Capillary: 129 mg/dL — ABNORMAL HIGH (ref 70–99)
Glucose-Capillary: 210 mg/dL — ABNORMAL HIGH (ref 70–99)
Glucose-Capillary: 258 mg/dL — ABNORMAL HIGH (ref 70–99)
Glucose-Capillary: 303 mg/dL — ABNORMAL HIGH (ref 70–99)

## 2019-07-19 LAB — BASIC METABOLIC PANEL
Anion gap: 11 (ref 5–15)
BUN: 27 mg/dL — ABNORMAL HIGH (ref 8–23)
CO2: 27 mmol/L (ref 22–32)
Calcium: 8.4 mg/dL — ABNORMAL LOW (ref 8.9–10.3)
Chloride: 99 mmol/L (ref 98–111)
Creatinine, Ser: 1.55 mg/dL — ABNORMAL HIGH (ref 0.44–1.00)
GFR calc Af Amer: 38 mL/min — ABNORMAL LOW (ref >60)
GFR calc non Af Amer: 32 mL/min — ABNORMAL LOW (ref >60)
Glucose, Bld: 140 mg/dL — ABNORMAL HIGH (ref 70–99)
Potassium: 3.7 mmol/L (ref 3.5–5.1)
Sodium: 137 mmol/L (ref 135–145)

## 2019-07-19 MED ORDER — POTASSIUM CHLORIDE CRYS ER 20 MEQ PO TBCR
20.0000 meq | EXTENDED_RELEASE_TABLET | Freq: Every day | ORAL | Status: AC
  Administered 2019-07-19: 20 meq via ORAL
  Filled 2019-07-19: qty 1

## 2019-07-19 MED ORDER — FUROSEMIDE 40 MG PO TABS
40.0000 mg | ORAL_TABLET | Freq: Two times a day (BID) | ORAL | Status: DC
  Administered 2019-07-19 – 2019-07-21 (×5): 40 mg via ORAL
  Filled 2019-07-19 (×6): qty 1

## 2019-07-19 MED ORDER — INSULIN ASPART 100 UNIT/ML ~~LOC~~ SOLN
0.0000 [IU] | Freq: Three times a day (TID) | SUBCUTANEOUS | Status: DC
  Administered 2019-07-19: 5 [IU] via SUBCUTANEOUS
  Administered 2019-07-20 – 2019-07-21 (×4): 2 [IU] via SUBCUTANEOUS

## 2019-07-19 MED ORDER — SENNOSIDES-DOCUSATE SODIUM 8.6-50 MG PO TABS
1.0000 | ORAL_TABLET | Freq: Two times a day (BID) | ORAL | Status: DC
  Administered 2019-07-19 – 2019-07-21 (×4): 1 via ORAL
  Filled 2019-07-19 (×5): qty 1

## 2019-07-19 MED ORDER — PAROXETINE HCL 30 MG PO TABS
30.0000 mg | ORAL_TABLET | Freq: Every day | ORAL | Status: DC
  Administered 2019-07-19 – 2019-07-21 (×2): 30 mg via ORAL
  Filled 2019-07-19 (×3): qty 1

## 2019-07-19 NOTE — H&P (View-Only) (Signed)
    [facility] has been requested to perform a transesophageal echocardiogram on [name] for Bacteremia.   After careful review of history and examination, the risks and benefits of transesophageal echocardiogram have been explained including risks of esophageal damage, perforation (1:10,000 risk), bleeding, pharyngeal hematoma as well as other potential complications associated with conscious sedation including aspiration, arrhythmia, respiratory failure and death. Alternatives to treatment were discussed, questions were answered. Patient is willing to proceed.  TEE - Dr. Marlou Porch @ 07/20/19 - 1200  . NPO after midnight. Meds with sips.   Leanor Kail, PA-C 07/19/2019 3:34 PM

## 2019-07-19 NOTE — Progress Notes (Signed)
Brief History:  [name] is a 74 year old female with a past medical history significant for hypertension, hyperlipidemia, insulin-dependent diabetes mellitus, aortic stenosis s/p replacement with porcine valve 2006 ,chronic kidney disease stage III, andHFpEFwho presented to the emergency department with a 3 day history of progressive confusion and generalized weakness.  Subjective:  [name] was evaluate and examined this morning at bedside. Patient states that she is feeling "well" this morning. She denies any fever, chills, cough, sputum production, or chest pain.  Patient reported some mild posterior lateral neck pain when laying down at night. The pain is positionally elicited and is not constant. Additionally, patient reports abdominal "tighteness." She denies any pain, or tenderness to palpation. The patient has not had a bowel movement since admission.  Objective:  Vital signs in last 24 hours: Vitals:   07/18/19 1400 07/18/19 1513 07/18/19 1852 07/18/19 2005  BP:  122/61  134/73  Pulse:  68  88  Resp: 20  17 (!) 25  Temp:  97.9 F (36.6 C)  98.7 F (37.1 C)  TempSrc:    Oral  SpO2:  100%    Weight:      Height:       Weight change:   Intake/Output Summary (Last 24 hours) at 07/19/2019 0743 Last data filed at 07/19/2019 0517 Gross per 24 hour  Intake 1681.36 ml  Output 950 ml  Net 731.36 ml    Physical Exam  Constitutional: No distress.  HENT:  Head: Normocephalic and atraumatic.  Eyes: EOM are normal.  Cardiovascular: Normal rate and regular rhythm. Exam reveals no gallop and no friction rub.  Murmur heard. III/VI harsh holosystolic murmur.  Pulmonary/Chest: Effort normal. No respiratory distress. She has no wheezes. She has no rales.  Abdominal: Soft. Bowel sounds are normal. There is no abdominal tenderness. There is no rebound.  Mildly distended.  Neurological: She is alert.  Skin: Skin is warm and dry.  Psychiatric: Mood and affect normal.      Assessment/Plan:  Principal Problem:   Acute encephalopathy Active Problems:   DM (diabetes mellitus) ([place])   Aortic stenosis s/p Tissure AVR 2006   CAD (coronary artery disease)   Bilateral lower extremity edema   Chronic diastolic congestive heart failure ([place])   Sacral decubitus ulcer, stage II ([place])   Bacteremia   Sepsis ([place])   [name] is a 74 year old female with a past medical history significant for hypertension, hyperlipidemia, insulin-dependent diabetes mellitus, aortic stenosis s/p replacement with porcine valve 2006 ,chronic kidney disease stage III, andHFpEFwho presented to the emergency department with a 3 day history of progressive confusion and generalized weakness.  # Acute Encephalopathy # Sepsis # Sacral Decubitus Ulcers Patient reported to the emergency department with AMS, and generalized weakness. Patient had a fever at 105 on admission. Patient denies any fever, chills, cough, sputum production, chest pain. Blood cultures obtained yesterday detected streptococcus agalactiae in the blood. Cefazolin was administered for targeted antibiotic therapy for bacteremia. Transthoracic ECHO performed in the setting of bacteremia with past medical history of TAVR, that did not reveal valvular vegetations. Lactic acid level was 2.3 yesterday afternoon, consistent with level of 2.3 on admission. Chest xray revealed diffuse pulmonary infiltrate indicating pulmonary congestion vs pneumonia. EEG was obtained and revealed mild diffuse encephalopathy without specific etiology. Patient has sacral decubitus ulcers that are being managed. Assessment: Given patients initial presentation with AMS, fever, leukocytosis, and elevated lactic acid. As well as the bacteremia, this likely etiology of  this patients acute presentation is sepsis. Patient appears to be improving well. Temperature was 98.7 this morning, and she has not had a fever since admission. The targeted antibiotic  therapy is being tolerated well. With bacteremia in the setting of TAVR, would like additional TEE to evaluate for endocarditis. Etiology of bacteremia still unclear, patient has sacral ulcerations that could be underlying source. Additionally, although chest x ray is more consistent with HF/edema, pneumonia is a possibility that could provide a source of bacteria for bacteremia. Plan: - Continue cefazolin for targeted Strep Agalactiae coverage. - Repeat Blood Culture tomorrow. - Cardiology consulted. TEE scheduled for Friday. - Repeat Lactic Acid. - Monitor Vitals. - Continue wound dressings per wound care recommendations for sacral decubitus ulcerations.  # Chronic Heart Failure Patient has chronic diastolic heart failure. ECHO performed yesterday calculated EF of 60 - 65%. Patient appears to be slightly volume up. Patient wearing unna boots in bed this morning. Patient on Lasix outpatient. - Strict Ins and Outs. - Daily Weights. - Continue metoprolol. - Start Lasix 40 mg BID. - Potassium Chloride 20 mEq daily. - Continue unna boot.   # Type II Diabetes Mellitus Last A1c was 8.6 on 05/19/2019. Outpatient medications include the following: degludec-liraglutide 10 units daily. Patient's most recent glucose was 129.  - Continue Levemir 8 units at bedtime. - SSI - very sensitive. - Continue to monitor CBG.  # Hypertension Patient has a history of sever exacerbations of HTN. Current medications are Hydrea 50 3 times daily, amlodipine 5 daily, metoprolol 25 daily. Blood pressure readings are becoming progressively elevated may consider initiating home antihypertensives if BP elevates. - Continue to hold antihypertensives in the setting of infection.  # Abdominal Distention Patient reports tightness in abdomen. No pain or tenderness to palpation. Patient has not had a bowel movement since admission. - PT/OT evaluation. - Senna-docusate.   Code:Full code Diet: Heart healthy/carb  modified VTEprophylaxis: Lovenox   LOS: 1 day   Nanetta Batty, Medical Student 07/19/2019, 7:43 AM

## 2019-07-19 NOTE — Evaluation (Signed)
Physical Therapy Evaluation Patient Details Name: [name] MRN: [id number] DOB: Dec 10, 1945 Today's Date: 07/19/2019   History of Present Illness  74 yo female admitted to ED On 5/23 with AMS secondary to diffuse encephalopathy, PNA vs pulmonary congestion. PMH includes breast cancer, CAD s/p CABG, CVA, osteoporosis, CHF, CKD, depression, HTN, HLD.  Clinical Impression   Pt presents with LE weakness L>R, impaired sitting and standing balance, difficulty performing mobility tasks, and decreased activity tolerance vs baseline. Pt to benefit from acute PT to address deficits. Pt required mod-max assist for bed mobility and transfer to recliner at bedside, mobility being very laborious for pt and requiring multimodal cuing for form and safety. PT recommending SNF level of care post-acutely. PT to progress mobility as tolerated, and will continue to follow acutely.      Follow Up Recommendations SNF;Supervision/Assistance - 24 hour    Equipment Recommendations  None recommended by PT    Recommendations for Other Services       Precautions / Restrictions Precautions Precautions: Fall Precaution Comments: bilateral unna boots, sacral ulceration bilat, L chronic hemiparesis 2* CVA Restrictions Weight Bearing Restrictions: No      Mobility  Bed Mobility Overal bed mobility: Needs Assistance Bed Mobility: Supine to Sit     Supine to sit: Max assist;HOB elevated     General bed mobility comments: max assist for trunk and LE management, scooting to EOB with use of bed pads, very increased time to perform.  Transfers Overall transfer level: Needs assistance Equipment used: Rolling walker (2 wheeled) Transfers: Sit to/from [employer] Sit to Stand: Mod assist;From elevated surface Stand pivot transfers: Mod assist       General transfer comment: Mod assist for power up, hip extension to neutral, and steadying. Verbal cuing for correct hand placement, not  followed. Stand pivot to recliner with small pivotal steps and mod assist for steadying, physically moving RW. Difficulty progressing LLE due to history of CVA.  Ambulation/Gait                Stairs            Wheelchair Mobility    Modified Rankin (Stroke Patients Only)       Balance Overall balance assessment: Needs assistance;History of Falls Sitting-balance support: Bilateral upper extremity supported;Feet supported Sitting balance-Leahy Scale: Fair Sitting balance - Comments: able to maintain balance without PT assist, posterior lean with fatigue Postural control: Posterior lean Standing balance support: Bilateral upper extremity supported;During functional activity Standing balance-Leahy Scale: Poor Standing balance comment: reliant on external support                             Pertinent Vitals/Pain Pain Assessment: No/denies pain    Home Living Family/patient expects to be discharged to:: Private residence Living Arrangements: Other relatives(granddaughter) Available Help at Discharge: Family;Available PRN/intermittently(granddaughter works during the day) Type of Home: Apartment Home Access: Level entry     Home Layout: One level Home Equipment: Walker - 2 wheels;Shower seat;Wheelchair - manual      Prior Function Level of Independence: Needs assistance   Gait / Transfers Assistance Needed: pt reports walking short distances with RW, uses w/c for longer distances. Pt reports multiple falls in the past year.  ADL's / Homemaking Assistance Needed: pt reports receiving assist for dressing, bathing, meal prep from aide 2 hours/day 3 days/week        Hand Dominance   Dominant Hand: Right  Extremity/Trunk Assessment   Upper Extremity Assessment Upper Extremity Assessment: Generalized weakness    Lower Extremity Assessment Lower Extremity Assessment: Generalized weakness;LLE deficits/detail LLE Deficits / Details: unable to  perform full AROM hip flexion, hip abd/add in sitting; history of CVA with L residual deficits    Cervical / Trunk Assessment Cervical / Trunk Assessment: Normal  Communication   Communication: No difficulties  Cognition Arousal/Alertness: Awake/alert Behavior During Therapy: Flat affect Overall Cognitive Status: Impaired/Different from baseline Area of Impairment: Problem solving;Following commands                       Following Commands: Follows one step commands consistently;Follows one step commands with increased time     Problem Solving: Slow processing;Difficulty sequencing;Decreased initiation;Requires verbal cues        General Comments      Exercises     Assessment/Plan    PT Assessment Patient needs continued PT services  PT Problem List Decreased strength;Decreased mobility;Decreased safety awareness;Decreased activity tolerance;Decreased balance;Decreased knowledge of use of DME;Decreased cognition;Decreased range of motion       PT Treatment Interventions DME instruction;Therapeutic activities;Gait training;Therapeutic exercise;Patient/family education;Balance training;Functional mobility training    PT Goals (Current goals can be found in the Care Plan section)  Acute Rehab PT Goals Patient Stated Goal: walk PT Goal Formulation: With patient Time For Goal Achievement: 08/02/19 Potential to Achieve Goals: Good    Frequency Min 2X/week   Barriers to discharge        Co-evaluation               AM-PAC PT "6 Clicks" Mobility  Outcome Measure Help needed turning from your back to your side while in a flat bed without using bedrails?: A Lot Help needed moving from lying on your back to sitting on the side of a flat bed without using bedrails?: Total Help needed moving to and from a bed to a chair (including a wheelchair)?: A Lot Help needed standing up from a chair using your arms (e.g., wheelchair or bedside chair)?: A Lot Help needed  to walk in hospital room?: A Lot Help needed climbing 3-5 steps with a railing? : Total 6 Click Score: 10    End of Session Equipment Utilized During Treatment: Gait belt Activity Tolerance: Patient tolerated treatment well;Patient limited by fatigue Patient left: in chair;with chair alarm set;with call bell/phone within reach Nurse Communication: Mobility status PT Visit Diagnosis: Other abnormalities of gait and mobility (R26.89);Muscle weakness (generalized) (M62.81);History of falling (Z91.81)    Time: 1308-6578 PT Time Calculation (min) (ACUTE ONLY): 19 min   Charges:   PT Evaluation $PT Eval Low Complexity: 1 Low     Zakhai Meisinger E, PT Acute Rehabilitation Services Pager [phone]  Office [phone]   Tyreek Clabo D Elonda Husky 07/19/2019, 5:25 PM

## 2019-07-19 NOTE — Progress Notes (Signed)
    [facility] has been requested to perform a transesophageal echocardiogram on [name] for Bacteremia.   After careful review of history and examination, the risks and benefits of transesophageal echocardiogram have been explained including risks of esophageal damage, perforation (1:10,000 risk), bleeding, pharyngeal hematoma as well as other potential complications associated with conscious sedation including aspiration, arrhythmia, respiratory failure and death. Alternatives to treatment were discussed, questions were answered. Patient is willing to proceed.  TEE - Dr. Marlou Porch @ 07/20/19 - 1200  . NPO after midnight. Meds with sips.   Leanor Kail, PA-C 07/19/2019 3:34 PM

## 2019-07-19 NOTE — Progress Notes (Signed)
Inpatient Diabetes Program Recommendations  AACE/ADA: New Consensus Statement on Inpatient Glycemic Control (2015)  Target Ranges:  Prepandial:   less than 140 mg/dL      Peak postprandial:   less than 180 mg/dL (1-2 hours)      Critically ill patients:  140 - 180 mg/dL   Lab Results  Component Value Date   GLUCAP 210 (H) 07/19/2019   HGBA1C 8.6 (A) 05/19/2019    Review of Glycemic Control Results for MADINE, SARR (MRN [id number]) as of 07/19/2019 12:47  Ref. Range 07/18/2019 20:27 07/19/2019 08:00 07/19/2019 11:35  Glucose-Capillary Latest Ref Range: 70 - 99 mg/dL 189 (H) 129 (H) 210 (H)   Diabetes history: DM 2 Outpatient Diabetes medications:  Xultophy 10 units daily (includes 10 units of basal insulin) Current orders for Inpatient glycemic control:  Novolog 0-6 units tid with meals Levemir 8 units q HS Inpatient Diabetes Program Recommendations:   Please consider adding Novolog 2 units tid with meals (hold if patient eats less than 50%).   Thanks  Adah Perl, RN, BC-ADM Inpatient Diabetes Coordinator Pager [phone] (8a-5p)

## 2019-07-20 ENCOUNTER — Inpatient Hospital Stay (HOSPITAL_COMMUNITY): Payer: Medicare Other | Admitting: Certified Registered Nurse Anesthetist

## 2019-07-20 ENCOUNTER — Inpatient Hospital Stay (HOSPITAL_COMMUNITY): Payer: Medicare Other

## 2019-07-20 ENCOUNTER — Encounter (HOSPITAL_COMMUNITY): Payer: Self-pay | Admitting: Internal Medicine

## 2019-07-20 ENCOUNTER — Encounter (HOSPITAL_COMMUNITY)
Admission: EM | Disposition: A | Discharge status: Other Acute Inpatient Hospital | Payer: Self-pay | Source: Home / Self Care | Attending: Internal Medicine

## 2019-07-20 DIAGNOSIS — B951 Streptococcus, group B, as the cause of diseases classified elsewhere: Secondary | ICD-10-CM

## 2019-07-20 DIAGNOSIS — I38 Endocarditis, valve unspecified: Secondary | ICD-10-CM

## 2019-07-20 DIAGNOSIS — R7881 Bacteremia: Secondary | ICD-10-CM

## 2019-07-20 DIAGNOSIS — I342 Nonrheumatic mitral (valve) stenosis: Secondary | ICD-10-CM

## 2019-07-20 HISTORY — PX: TEE WITHOUT CARDIOVERSION: SHX5443

## 2019-07-20 LAB — CBC
HCT: 32.4 % — ABNORMAL LOW (ref 36.0–46.0)
Hemoglobin: 10.3 g/dL — ABNORMAL LOW (ref 12.0–15.0)
MCH: 29.9 pg (ref 26.0–34.0)
MCHC: 31.8 g/dL (ref 30.0–36.0)
MCV: 93.9 fL (ref 80.0–100.0)
Platelets: 193 10*3/uL (ref 150–400)
RBC: 3.45 MIL/uL — ABNORMAL LOW (ref 3.87–5.11)
RDW: 13.7 % (ref 11.5–15.5)
WBC: 9.2 10*3/uL (ref 4.0–10.5)
nRBC: 0 % (ref 0.0–0.2)

## 2019-07-20 LAB — GLUCOSE, CAPILLARY
Glucose-Capillary: 219 mg/dL — ABNORMAL HIGH (ref 70–99)
Glucose-Capillary: 168 mg/dL — ABNORMAL HIGH (ref 70–99)
Glucose-Capillary: 208 mg/dL — ABNORMAL HIGH (ref 70–99)

## 2019-07-20 LAB — BASIC METABOLIC PANEL
Anion gap: 12 (ref 5–15)
BUN: 26 mg/dL — ABNORMAL HIGH (ref 8–23)
CO2: 27 mmol/L (ref 22–32)
Calcium: 8.6 mg/dL — ABNORMAL LOW (ref 8.9–10.3)
Chloride: 98 mmol/L (ref 98–111)
Creatinine, Ser: 1.56 mg/dL — ABNORMAL HIGH (ref 0.44–1.00)
GFR calc Af Amer: 37 mL/min — ABNORMAL LOW (ref >60)
GFR calc non Af Amer: 32 mL/min — ABNORMAL LOW (ref >60)
Glucose, Bld: 239 mg/dL — ABNORMAL HIGH (ref 70–99)
Potassium: 3.6 mmol/L (ref 3.5–5.1)
Sodium: 137 mmol/L (ref 135–145)

## 2019-07-20 LAB — MAGNESIUM: Magnesium: 1.8 mg/dL (ref 1.7–2.4)

## 2019-07-20 SURGERY — ECHOCARDIOGRAM, TRANSESOPHAGEAL
Anesthesia: Monitor Anesthesia Care

## 2019-07-20 MED ORDER — AMLODIPINE BESYLATE 5 MG PO TABS
5.0000 mg | ORAL_TABLET | Freq: Every day | ORAL | Status: DC
  Administered 2019-07-20 – 2019-07-21 (×2): 5 mg via ORAL
  Filled 2019-07-20 (×2): qty 1

## 2019-07-20 MED ORDER — PROPOFOL 500 MG/50ML IV EMUL
INTRAVENOUS | Status: DC | PRN
  Administered 2019-07-20: 100 ug/kg/min via INTRAVENOUS

## 2019-07-20 MED ORDER — POTASSIUM CHLORIDE CRYS ER 20 MEQ PO TBCR
20.0000 meq | EXTENDED_RELEASE_TABLET | Freq: Four times a day (QID) | ORAL | Status: AC
  Filled 2019-07-20 (×2): qty 1

## 2019-07-20 MED ORDER — SODIUM CHLORIDE 0.9 % IV SOLN
INTRAVENOUS | Status: DC
  Administered 2019-07-20: 500 mL via INTRAVENOUS

## 2019-07-20 MED ORDER — INSULIN ASPART 100 UNIT/ML ~~LOC~~ SOLN
3.0000 [IU] | Freq: Three times a day (TID) | SUBCUTANEOUS | Status: DC
  Administered 2019-07-21 (×3): 3 [IU] via SUBCUTANEOUS

## 2019-07-20 MED ORDER — INSULIN DETEMIR 100 UNIT/ML ~~LOC~~ SOLN
4.0000 [IU] | Freq: Once | SUBCUTANEOUS | Status: DC
  Filled 2019-07-20: qty 0.04

## 2019-07-20 MED ORDER — PROPOFOL 10 MG/ML IV BOLUS
INTRAVENOUS | Status: DC | PRN
  Administered 2019-07-20 (×4): 20 mg via INTRAVENOUS

## 2019-07-20 MED ORDER — INSULIN DETEMIR 100 UNIT/ML ~~LOC~~ SOLN
10.0000 [IU] | Freq: Every day | SUBCUTANEOUS | Status: DC
  Administered 2019-07-20 – 2019-07-21 (×2): 10 [IU] via SUBCUTANEOUS
  Filled 2019-07-20 (×2): qty 0.1

## 2019-07-20 MED ORDER — LIDOCAINE HCL (CARDIAC) PF 100 MG/5ML IV SOSY
PREFILLED_SYRINGE | INTRAVENOUS | Status: DC | PRN
  Administered 2019-07-20: 40 mg via INTRAVENOUS

## 2019-07-20 MED ORDER — INSULIN ASPART 100 UNIT/ML ~~LOC~~ SOLN
0.0000 [IU] | Freq: Three times a day (TID) | SUBCUTANEOUS | Status: DC
Start: 2019-07-20 — End: 2019-07-20

## 2019-07-20 MED ORDER — ENOXAPARIN SODIUM 40 MG/0.4ML ~~LOC~~ SOLN
40.0000 mg | Freq: Every day | SUBCUTANEOUS | Status: DC
  Administered 2019-07-21: 40 mg via SUBCUTANEOUS
  Filled 2019-07-20 (×2): qty 0.4

## 2019-07-20 NOTE — Transfer of Care (Signed)
Immediate Anesthesia Transfer of Care Note  Patient: [name]  Procedure(s) Performed: TRANSESOPHAGEAL ECHOCARDIOGRAM (TEE) (N/A )  Patient Location: Endoscopy Unit  Anesthesia Type:MAC  Level of Consciousness: drowsy and patient cooperative  Airway & Oxygen Therapy: Patient Spontanous Breathing and Patient connected to face mask oxygen  Post-op Assessment: Report given to RN and Post -op Vital signs reviewed and stable  Post vital signs: Reviewed and stable  Last Vitals:  Vitals Value Taken Time  BP 146/54 07/20/19 1242  Temp 37.1 C 07/20/19 1242  Pulse 88 07/20/19 1244  Resp 17 07/20/19 1244  SpO2 100 % 07/20/19 1244  Vitals shown include unvalidated device data.  Last Pain:  Vitals:   07/20/19 1242  TempSrc: Axillary  PainSc: 0-No pain         Complications: No apparent anesthesia complications

## 2019-07-20 NOTE — Interval H&P Note (Signed)
History and Physical Interval Note:  07/20/2019 11:57 AM  [name]  has presented today for surgery, with the diagnosis of BACTEREMIA.  The various methods of treatment have been discussed with the patient and family. After consideration of risks, benefits and other options for treatment, the patient has consented to  Procedure(s): TRANSESOPHAGEAL ECHOCARDIOGRAM (TEE) (N/A) as a surgical intervention.  The patient's history has been reviewed, patient examined, no change in status, stable for surgery.  I have reviewed the patient's chart and labs.  Questions were answered to the patient's satisfaction.     [employer]

## 2019-07-20 NOTE — Progress Notes (Signed)
UPDATE NOTE:  Pt underwent TEE today for GBS bacteremia in the setting of AVR and pacemaker. TEE today shows vegetation on pacer wire.  Discussed with ID who will place recs on antibiotics.  Confirmed with cardiology for EP to evaluate for possible device removal.   Mitzi Hansen, MD 07/20/19 2:27 PM

## 2019-07-20 NOTE — Consult Note (Signed)
[facility] Infectious Disease    Date of Admission:  07/17/2019     Total days of antibiotics 2   Day 3 Cefazolin              Reason for Consult: Group B Strep bacteremia with PPM vegetation    Referring Provider: Heber Almond  Primary Care Provider: [name], MD   Assessment: [name] is a 74 y.o. female with group b streptococcal bacteremia and vegetation on pacemaker lead with peri-valvular leak involving TAVR. EP note reviewed - this was discussed with structural heart team and appears this finding is stable from previous echos without concern for new process. Her pacameker on the other hand is infected; given she is 100% pacer dependent EP does not consider her a candidate for extraction at this time. Pulmonary findings likely related to right sided/lead endocarditis and possible distal embolization. She will require IV therapy for 4-6 weeks and chronic suppression with amoxicillin thereafter.   Will continue to treat with Cefazolin - could narrow her to continuous ampicillin infusion if the salt load in the fluid won't be too much for her diastolic dysfunction.    She has cleared based on blood cultures - would be OK to place PICC Line for long term management. Her sacral wounds appear to be stage 3 with full thickness skin loss and open skin. Does not appear to have had any wounds on lower extremities per [facility] team notes.     Plan: 1. Continue cefazolin for now 2. PICC line OK for placement from our perspective  3. Suspect she will need SNF placement.     Principal Problem:   Sepsis ([place]) Active Problems:   DM (diabetes mellitus) ([place])   Aortic stenosis s/p Tissure AVR 2006   CAD (coronary artery disease)   Bilateral lower extremity edema   Chronic diastolic congestive heart failure ([place])   Septic encephalopathy   Sacral decubitus ulcer, stage II ([place])   Bacteremia due to group B Streptococcus   . aspirin EC  81 mg Oral Daily  .  atorvastatin  80 mg Oral QHS  . clopidogrel  75 mg Oral Q breakfast  . enoxaparin (LOVENOX) injection  40 mg Subcutaneous Daily  . furosemide  40 mg Oral BID  . Gerhardt's butt cream   Topical TID AC & HS  . insulin aspart  0-9 Units Subcutaneous TID WC  . insulin aspart  3 Units Subcutaneous TID WC  . insulin detemir  10 Units Subcutaneous QHS  . PARoxetine  30 mg Oral Daily  . potassium chloride  20 mEq Oral Q6H  . senna-docusate  1 tablet Oral BID    HPI: [name] is a 74 y.o. female admitted from home with weakness. She lives with her 55 yo granddaughter.   PMHx includes TAVR (02-2019), PPM (dependent on device), HTN, HLD, IDDM, sacral wounds, CKD3.   Presented with fever and leukocytosis with initial blood cultures growing Groub B strep. She was treated with Cefazolin with a good response in leukocytosis and clearance of her blood cultures after 1 dose of Ceftriaxone. Initially was treated for concern over CXR infiltrates. She has had a hard time with some urinary incontinence since hospitalization but unclear if this was a problem at home. Urinalysis was normal at the time of admission. She did have AMS initially but improved with fluids and antibiotics. She has some sacral decubitus ulcers present.    Review of  Systems: Review of Systems  Constitutional: Positive for chills, fever and malaise/fatigue.  Eyes: Negative for blurred vision.  Respiratory: Negative for cough and shortness of breath.   Cardiovascular: Positive for leg swelling (wears unna boots frequently ).  Gastrointestinal: Negative for abdominal pain, diarrhea, nausea and vomiting.  Genitourinary: Negative for dysuria.  Musculoskeletal: Negative for back pain, joint pain and myalgias.  Neurological: Negative for dizziness and headaches.    Past Medical History:  Diagnosis Date  . Acute on chronic diastolic CHF (congestive heart failure) ([place]) 06/22/2017  . Acute on chronic renal failure ([place]) 05/25/2011    . Adenocarcinoma of breast ([place]) 1996   Completed tamoxifen and had mastectomy.  . Aortic stenosis, severe    s/p aortic valve replacement with porcine valve 06/2004.  ECHO 2010 EF 92%, LVH, diastolic dysfxn, Bioprostetic aoritc valve, mild AS. ECHO 2013 EF 60%, Nl aortic artificial valve, dynamic obstruction in the outflow tract   Class IIb rec for annual TTE after 5 yrs. She had a TTE 2013    . Arthritis   . CAD (coronary artery disease) 2006   s/p CABG (5/06) w/ saphenous vein to RCA at time of AVR  . CKD (chronic kidney disease) stage 2, GFR 60-89 ml/min 06/03/2011   She is no longer taking NSAIDs.   [place] CVA (cerebral infarction) 2006   Post-op from AVR. Presumed embolic in nature. Carotid stenosis of R 60-79%. Repeat dopplers 4/10 no R stenosis and L stenosis of 1-29%.  . Depression    Controlled on Paxil  . Diabetes mellitus 1992   Dx 04/25/1990. Now insulin dependent, started 2008. On ACEI.   . Diverticulosis 2001  . History of 2019 novel coronavirus disease (COVID-19)   . Hyperlipidemia    Mgmt with a statin  . Hypertension    Requires 4 drug tx  . Osteoporosis 2006   DEXA 10/06 : L femur T -2.8, R -2.7. Lumbar T -2.4. On bisphosphonates and  Calcium / Vit D.  . Presence of permanent cardiac pacemaker   . Refusal of blood transfusions as patient is Jehovah's Witness   . Stroke [facility])     Social History   Tobacco Use  . Smoking status: Never Smoker  . Smokeless tobacco: Never Used  Substance Use Topics  . Alcohol use: Yes    Alcohol/week: 0.0 standard drinks    Comment: Occasional beer, monthly  . Drug use: No    Family History  Problem Relation Age of Onset  . Diabetes Mother   . Hypertension Mother   . Alzheimer's disease Mother   . Heart disease Father 37       AMI at age 68 and 53  . Mental illness Sister   . Heart disease Sister 22       AMI  . Kidney disease Sister    Allergies  Allergen Reactions  . 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," as  the patient is a Jehovah's Witness    OBJECTIVE: Blood pressure (!) 187/72, pulse 87, temperature 98.8 F (37.1 C), temperature source Axillary, resp. rate 18, height $RemoveBe'5\' 6"'vvmTFJVVk$  (1.676 m), weight 76.7 kg, SpO2 95 %.  Physical Exam Vitals reviewed.  Eyes:     General: No scleral icterus.    Pupils: Pupils are equal, round, and reactive to light.  Cardiovascular:     Rate and Rhythm: Regular rhythm.     Heart sounds: Murmur present.  Abdominal:     General: Bowel sounds are normal.  There is no distension.     Palpations: Abdomen is soft.  Musculoskeletal:     Cervical back: Neck supple. No tenderness.  Skin:    General: Skin is warm and dry.     Capillary Refill: Capillary refill takes less than 2 seconds.     Comments: She has two small areas that are stage 3 with full tissue loss and central opening. No drainage or periwound erythema or cellulitis.  B/L lower extremities are wrapped.  PPM without erythema or tenderness.   Neurological:     Mental Status: She is alert and oriented to person, place, and time.     Lab Results Lab Results  Component Value Date   WBC 9.2 07/20/2019   HGB 10.3 (L) 07/20/2019   HCT 32.4 (L) 07/20/2019   MCV 93.9 07/20/2019   PLT 193 07/20/2019    Lab Results  Component Value Date   CREATININE 1.56 (H) 07/20/2019   BUN 26 (H) 07/20/2019   NA 137 07/20/2019   K 3.6 07/20/2019   CL 98 07/20/2019   CO2 27 07/20/2019    Lab Results  Component Value Date   ALT 13 07/17/2019   AST 17 07/17/2019   ALKPHOS 141 (H) 07/17/2019   BILITOT 0.8 07/17/2019     Microbiology: Recent Results (from the past 240 hour(s))  Urine culture     Status: Abnormal   Collection Time: 07/17/19  9:37 PM   Specimen: In/Out Cath Urine  Result Value Ref Range Status   Specimen Description IN/OUT CATH URINE  Final   Special Requests   Final    NONE Performed at [facility], [address], [place], [place] [address]    Culture MULTIPLE SPECIES PRESENT,  SUGGEST RECOLLECTION (A)  Final   Report Status 07/19/2019 FINAL  Final  Blood Culture (routine x 2)     Status: Abnormal   Collection Time: 07/17/19  9:50 PM   Specimen: BLOOD  Result Value Ref Range Status   Specimen Description BLOOD RIGHT ANTECUBITAL  Final   Special Requests   Final    BOTTLES DRAWN AEROBIC AND ANAEROBIC Blood Culture adequate volume   Culture  Setup Time   Final    GRAM POSITIVE COCCI IN CHAINS IN BOTH AEROBIC AND ANAEROBIC BOTTLES Organism ID to follow CRITICAL RESULT CALLED TO, READ BACK BY AND VERIFIED WITH: Ferne Coe PharmD 11:25 07/18/19 ([email or web address]) Performed at [facility], [address] [place], [place] [address]    Culture GROUP B STREP(S.AGALACTIAE)ISOLATED (A)  Final   Report Status 07/20/2019 FINAL  Final   Organism ID, Bacteria GROUP B STREP(S.AGALACTIAE)ISOLATED  Final      Susceptibility   Group b strep(s.agalactiae)isolated - MIC*    CLINDAMYCIN <=0.25 SENSITIVE Sensitive     AMPICILLIN <=0.25 SENSITIVE Sensitive     ERYTHROMYCIN <=0.12 SENSITIVE Sensitive     VANCOMYCIN 0.5 SENSITIVE Sensitive     CEFTRIAXONE <=0.12 SENSITIVE Sensitive     LEVOFLOXACIN 0.5 SENSITIVE Sensitive     * GROUP B STREP(S.AGALACTIAE)ISOLATED  Blood Culture ID Panel (Reflexed)     Status: Abnormal   Collection Time: 07/17/19  9:50 PM  Result Value Ref Range Status   Enterococcus species NOT DETECTED NOT DETECTED Final   Listeria monocytogenes NOT DETECTED NOT DETECTED Final   Staphylococcus species NOT DETECTED NOT DETECTED Final   Staphylococcus aureus (BCID) NOT DETECTED NOT DETECTED Final   Streptococcus species DETECTED (A) NOT DETECTED Final    Comment: CRITICAL RESULT  CALLED TO, READ BACK BY AND VERIFIED WITH: Ferne Coe PharmD 11:25 07/18/19 ([email or web address])    Streptococcus agalactiae DETECTED (A) NOT DETECTED Final    Comment: CRITICAL RESULT CALLED TO, READ BACK BY AND VERIFIED WITH: Ferne Coe PharmD 11:25 07/18/19 ([email or web address])    Streptococcus pneumoniae  NOT DETECTED NOT DETECTED Final   Streptococcus pyogenes NOT DETECTED NOT DETECTED Final   Acinetobacter baumannii NOT DETECTED NOT DETECTED Final   Enterobacteriaceae species NOT DETECTED NOT DETECTED Final   Enterobacter cloacae complex NOT DETECTED NOT DETECTED Final   Escherichia coli NOT DETECTED NOT DETECTED Final   Klebsiella oxytoca NOT DETECTED NOT DETECTED Final   Klebsiella pneumoniae NOT DETECTED NOT DETECTED Final   Proteus species NOT DETECTED NOT DETECTED Final   Serratia marcescens NOT DETECTED NOT DETECTED Final   Haemophilus influenzae NOT DETECTED NOT DETECTED Final   Neisseria meningitidis NOT DETECTED NOT DETECTED Final   Pseudomonas aeruginosa NOT DETECTED NOT DETECTED Final   Candida albicans NOT DETECTED NOT DETECTED Final   Candida glabrata NOT DETECTED NOT DETECTED Final   Candida krusei NOT DETECTED NOT DETECTED Final   Candida parapsilosis NOT DETECTED NOT DETECTED Final   Candida tropicalis NOT DETECTED NOT DETECTED Final    Comment: Performed at [facility], [place]. [address], [place], [place] [address]  SARS Coronavirus 2 by RT PCR (hospital order, performed in [facility] hospital lab) Nasopharyngeal Nasopharyngeal Swab     Status: None   Collection Time: 07/17/19 10:10 PM   Specimen: Nasopharyngeal Swab  Result Value Ref Range Status   SARS Coronavirus 2 NEGATIVE NEGATIVE Final    Comment: (NOTE) SARS-CoV-2 target nucleic acids are NOT DETECTED. The SARS-CoV-2 RNA is generally detectable in upper and lower respiratory specimens during the acute phase of infection. The lowest concentration of SARS-CoV-2 viral copies this assay can detect is 250 copies / mL. A negative result does not preclude SARS-CoV-2 infection and should not be used as the sole basis for treatment or other patient management decisions.  A negative result may occur with improper specimen collection / handling, submission of specimen other than nasopharyngeal swab, presence of  viral mutation(s) within the areas targeted by this assay, and inadequate number of viral copies (<250 copies / mL). A negative result must be combined with clinical observations, patient history, and epidemiological information. Fact Sheet for Patients:   StrictlyIdeas.no Fact Sheet for Healthcare Providers: BankingDealers.co.za This test is not yet approved or cleared  by the [place] FDA and has been authorized for detection and/or diagnosis of SARS-CoV-2 by FDA under an Emergency Use Authorization (EUA).  This EUA will remain in effect (meaning this test can be used) for the duration of the COVID-19 declaration under Section 564(b)(1) of the Act, 21 U.S.C. section 360bbb-3(b)(1), unless the authorization is terminated or revoked sooner. Performed at [facility], [place] [address], [place], [place] [address]   Respiratory Panel by PCR     Status: None   Collection Time: 07/18/19  3:14 AM   Specimen: Nasopharyngeal Swab; Respiratory  Result Value Ref Range Status   Adenovirus NOT DETECTED NOT DETECTED Final   Coronavirus 229E NOT DETECTED NOT DETECTED Final    Comment: (NOTE) The Coronavirus on the Respiratory Panel, DOES NOT test for the novel  Coronavirus (2019 nCoV)    Coronavirus HKU1 NOT DETECTED NOT DETECTED Final   Coronavirus NL63 NOT DETECTED NOT DETECTED Final   Coronavirus OC43 NOT DETECTED NOT DETECTED Final   Metapneumovirus NOT DETECTED  NOT DETECTED Final   Rhinovirus / Enterovirus NOT DETECTED NOT DETECTED Final   Influenza A NOT DETECTED NOT DETECTED Final   Influenza B NOT DETECTED NOT DETECTED Final   Parainfluenza Virus 1 NOT DETECTED NOT DETECTED Final   Parainfluenza Virus 2 NOT DETECTED NOT DETECTED Final   Parainfluenza Virus 3 NOT DETECTED NOT DETECTED Final   Parainfluenza Virus 4 NOT DETECTED NOT DETECTED Final   Respiratory Syncytial Virus NOT DETECTED NOT DETECTED Final   Bordetella pertussis  NOT DETECTED NOT DETECTED Final   Chlamydophila pneumoniae NOT DETECTED NOT DETECTED Final   Mycoplasma pneumoniae NOT DETECTED NOT DETECTED Final    Comment: Performed at [facility], [place] [address] [place], [place] [address]  Blood Culture (routine x 2)     Status: None (Preliminary result)   Collection Time: 07/18/19 11:13 AM   Specimen: BLOOD  Result Value Ref Range Status   Specimen Description BLOOD SITE NOT SPECIFIED  Final   Special Requests   Final    BOTTLES DRAWN AEROBIC AND ANAEROBIC Blood Culture results may not be optimal due to an excessive volume of blood received in culture bottles   Culture   Final    NO GROWTH 2 DAYS Performed at [facility], [place] [address], [place], [place] [address]    Report Status PENDING  Incomplete  Culture, blood (routine x 2)     Status: None (Preliminary result)   Collection Time: 07/20/19  7:54 AM   Specimen: BLOOD LEFT HAND  Result Value Ref Range Status   Specimen Description BLOOD LEFT HAND  Final   Special Requests   Final    BOTTLES DRAWN AEROBIC AND ANAEROBIC Blood Culture adequate volume   Culture   Final    NO GROWTH < 12 HOURS Performed at [facility], [place] [address], [place], [place] [address]    Report Status PENDING  Incomplete  Culture, blood (routine x 2)     Status: None (Preliminary result)   Collection Time: 07/20/19  7:56 AM   Specimen: BLOOD RIGHT ARM  Result Value Ref Range Status   Specimen Description BLOOD RIGHT ARM  Final   Special Requests   Final    BOTTLES DRAWN AEROBIC AND ANAEROBIC Blood Culture adequate volume   Culture   Final    NO GROWTH < 12 HOURS Performed at [facility], [place] [address] [place], [place] [address]    Report Status PENDING  Incomplete    Janene Madeira, MSN, NP-C [facility] [email or web address] Pager: [phone] Office: [phone] [place]: [phone]

## 2019-07-20 NOTE — Anesthesia Preprocedure Evaluation (Signed)
Anesthesia Evaluation  Patient identified by MRN, date of birth, ID band Patient awake    Reviewed: Allergy & Precautions, NPO status , Patient's Chart, lab work & pertinent test results  Airway Mallampati: II  TM Distance: >3 FB Neck ROM: Full    Dental  (+) Teeth Intact, Dental Advisory Given,    Pulmonary     + decreased breath sounds      Cardiovascular hypertension,  Rhythm:Regular Rate:Normal + Systolic murmurs    Neuro/Psych    GI/Hepatic   Endo/Other  diabetes  Renal/GU      Musculoskeletal   Abdominal   Peds  Hematology   Anesthesia Other Findings   Reproductive/Obstetrics                             Anesthesia Physical Anesthesia Plan  ASA: III  Anesthesia Plan: MAC   Post-op Pain Management:    Induction: Intravenous  PONV Risk Score and Plan: Propofol infusion  Airway Management Planned: Simple Face Mask and Nasal Cannula  Additional Equipment:   Intra-op Plan:   Post-operative Plan:   Informed Consent: I have reviewed the patients History and Physical, chart, labs and discussed the procedure including the risks, benefits and alternatives for the proposed anesthesia with the patient or authorized representative who has indicated his/her understanding and acceptance.       Plan Discussed with: CRNA and Anesthesiologist  Anesthesia Plan Comments:         Anesthesia Quick Evaluation
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
## 2019-07-20 NOTE — Progress Notes (Addendum)
Brief History:  [name] is a 74 year old female with a past medical history significant for hypertension, hyperlipidemia, insulin-dependent diabetes mellitus, aortic stenosis s/p TAVR 02/2019, chronic kidney disease stage III, and HFpEF who presented to the emergency department with a 3 day history of progressive confusion and generalized weakness.  Subjective:  [name] was evaluated and examined at bedside this morning. Patient states that she is feeling well this morning. She denies any fever, chills, cough, abdominal pain, abdominal discomfort, or neck discomfort.  Objective:  Vital signs in last 24 hours: Vitals:   07/19/19 1601 07/19/19 2112 07/20/19 0750 07/20/19 0752  BP: (!) 168/68 (!) 170/79 (!) 170/70 (!) 170/70  Pulse: 84 88 79 77  Resp: $Remo'16 20 15 15  'yEYyD$ Temp: 99.3 F (37.4 C) 98.2 F (36.8 C) 98.6 F (37 C) 98.6 F (37 C)  TempSrc:      SpO2: 94% 95% 95% 95%  Weight:      Height:       Weight change:   Intake/Output Summary (Last 24 hours) at 07/20/2019 0902 Last data filed at 07/20/2019 0156 Gross per 24 hour  Intake 320 ml  Output 3300 ml  Net -2980 ml   Physical Exam  Constitutional: She is oriented to person, place, and time. No distress.  HENT:  Head: Normocephalic and atraumatic.  Eyes: EOM are normal.  Cardiovascular: Normal rate and regular rhythm. Exam reveals no gallop and no friction rub.  Murmur heard. III/VI harsh systolic murmur.  Pulmonary/Chest: Effort normal and breath sounds normal. No respiratory distress. She has no wheezes.  Abdominal: Soft. Bowel sounds are normal. She exhibits no distension. There is no abdominal tenderness.  Neurological: She is alert and oriented to person, place, and time.  Skin: Skin is warm and dry. She is not diaphoretic.  Unna boots on bilateral lower extremities.  Psychiatric: Mood and affect normal.   [name] is a 74 year old female with a past medical history significant for hypertension,  hyperlipidemia, insulin-dependent diabetes mellitus, aortic stenosis s/p TAVR 02/2019, chronic kidney disease stage III, and HFpEF who presented to the emergency department with a 3 day history of progressive confusion and generalized weakness.   Assessment/Plan:  Principal Problem:   Sepsis ([place]) Active Problems:   DM (diabetes mellitus) ([place])   Aortic stenosis s/p Tissure AVR 2006   CAD (coronary artery disease)   Bilateral lower extremity edema   Chronic diastolic congestive heart failure (HCC)   Septic encephalopathy   Sacral decubitus ulcer, stage II ([place])   Bacteremia due to group B Streptococcus     # Acute Encephalopathy # Sepsis #Streptococcus Agalactiae bacteremia # Sacral Decubitus Ulcers Patient reported to the emergency department with AMS, and generalized weakness. Patient had a fever at 105 on admission. Patient denies any fever, chills, or cough. Blood cultures were positive for strep. Agalactiae. Currently treating bacteremia with cefazolin. TEE scheduled for today to assess for bacterial endocarditis in the setting of bacteremia. Assessment: Patients initial presentation and lab values in the setting of bacteremia are consistent with sepsis. Patient is improving well on current treatment strategy. Patient has remained afebrile since admission and is currently not tachycardic. She is not endorsing cough or sputum production, sacral ulcerations may be the source of the bacteremia. Plan: - Continue cefazolin for targeted Strep Agalactiae coverage. - Repeat Blood Culture. - TEE scheduled for today at noon. - NPO for TEE. - Monitor Vitals. - Continue wound dressings per wound care recommendations for sacral  decubitus ulcerations.   # Chronic Heart Failure with preserved EF Patient has chronic diastolic heart failure. ECHO performed yesterday calculated EF of 60 - 65%. Started home dose of lasix yesterday and patient is diuresing well. Patient wearing unna boots in bed  this morning. - Strict Ins and Outs. - Daily Weights. - Continue metoprolol. - Continue Lasix 40 mg BID. - Potassium Chloride 20 mEq twice. - Continue unna boot.     # Type II Diabetes Mellitus Uncontrolled with Hyperglycemia Last A1c was 8.6 on 05/19/2019. Outpatient medications include the following: degludec-liraglutide 10 units daily. Patient's most recent glucose was 219. Patient has been consistently hyperglycemic since admission. - Increase Levemir to 10 units at bedtime. - 3 Units of Novolog with meals, if patient eats more that 50% of meal. - SSI - very sensitive. - Continue to monitor CBG.   # Hypertension Patient has a history of sever exacerbations of HTN. Current medications are Hydrea 50 3 times daily, amlodipine 5 daily. Blood pressure this morning is 170/79. - Start home amlodipine 5 mg once a day.   # Abdominal Distention Patient reports resolution of abdominal tightness and discomfort. - Continue senna-docusate.  Code Status: Full code Diet: NPO VTE prophylaxis: Lovenox    LOS: 2 days   [name], Medical Student 07/20/2019, 9:02 AM

## 2019-07-20 NOTE — CV Procedure (Signed)
   Transesophageal Echocardiogram  Indications: Bacteremia  Time out performed  Propofol with anesthesia  Findings:  Left Ventricle: LVH, normal EF 65%  Mitral Valve: Severe MAC, thickening of MV leaflets. Moderate stenosis with mean gradient of 8 mmHg.   Aortic Valve: TAVR valve with no aortic regurgitation. There is trvial perivalvular leak at 12-1 o'clock on TEE. No vegetations noted.   Tricuspid Valve: Mild TR, challenging to visualize given shadow artifact.   Left Atrium: No thrombus in LAA  Right Atrium: Pacer wires noted. There is a moderate sized mobile linear echodensity consistent with vegetation on pacer wire.   IMPRESSION: PACER WIRE vegetation- endocarditis  Candee Furbish, MD

## 2019-07-20 NOTE — Progress Notes (Signed)
  Echocardiogram Echocardiogram Transesophageal has been performed.  [name] [name] 07/20/2019, 12:52 PM

## 2019-07-21 ENCOUNTER — Inpatient Hospital Stay (HOSPITAL_COMMUNITY): Payer: Medicare Other

## 2019-07-21 DIAGNOSIS — I350 Nonrheumatic aortic (valve) stenosis: Secondary | ICD-10-CM

## 2019-07-21 DIAGNOSIS — R509 Fever, unspecified: Secondary | ICD-10-CM

## 2019-07-21 DIAGNOSIS — I330 Acute and subacute infective endocarditis: Secondary | ICD-10-CM

## 2019-07-21 DIAGNOSIS — G9341 Metabolic encephalopathy: Secondary | ICD-10-CM

## 2019-07-21 DIAGNOSIS — Z952 Presence of prosthetic heart valve: Secondary | ICD-10-CM

## 2019-07-21 DIAGNOSIS — L89152 Pressure ulcer of sacral region, stage 2: Secondary | ICD-10-CM

## 2019-07-21 DIAGNOSIS — T827XXS Infection and inflammatory reaction due to other cardiac and vascular devices, implants and grafts, sequela: Secondary | ICD-10-CM

## 2019-07-21 DIAGNOSIS — R6520 Severe sepsis without septic shock: Secondary | ICD-10-CM

## 2019-07-21 DIAGNOSIS — A401 Sepsis due to streptococcus, group B: Secondary | ICD-10-CM

## 2019-07-21 DIAGNOSIS — T827XXA Infection and inflammatory reaction due to other cardiac and vascular devices, implants and grafts, initial encounter: Secondary | ICD-10-CM

## 2019-07-21 DIAGNOSIS — E119 Type 2 diabetes mellitus without complications: Secondary | ICD-10-CM

## 2019-07-21 LAB — BASIC METABOLIC PANEL
Anion gap: 8 (ref 5–15)
BUN: 20 mg/dL (ref 8–23)
CO2: 29 mmol/L (ref 22–32)
Calcium: 8.5 mg/dL — ABNORMAL LOW (ref 8.9–10.3)
Chloride: 102 mmol/L (ref 98–111)
Creatinine, Ser: 1.35 mg/dL — ABNORMAL HIGH (ref 0.44–1.00)
GFR calc Af Amer: 44 mL/min — ABNORMAL LOW (ref >60)
GFR calc non Af Amer: 38 mL/min — ABNORMAL LOW (ref >60)
Glucose, Bld: 209 mg/dL — ABNORMAL HIGH (ref 70–99)
Potassium: 3.6 mmol/L (ref 3.5–5.1)
Sodium: 139 mmol/L (ref 135–145)

## 2019-07-21 LAB — CULTURE, BLOOD (ROUTINE X 2): Special Requests: ADEQUATE

## 2019-07-21 LAB — GLUCOSE, CAPILLARY
Glucose-Capillary: 198 mg/dL — ABNORMAL HIGH (ref 70–99)
Glucose-Capillary: 147 mg/dL — ABNORMAL HIGH (ref 70–99)
Glucose-Capillary: 278 mg/dL — ABNORMAL HIGH (ref 70–99)
Glucose-Capillary: 311 mg/dL — ABNORMAL HIGH (ref 70–99)
Glucose-Capillary: 330 mg/dL — ABNORMAL HIGH (ref 70–99)

## 2019-07-21 MED ORDER — PENICILLIN G POTASSIUM 20000000 UNITS IJ SOLR
12.0000 10*6.[IU] | Freq: Two times a day (BID) | INTRAVENOUS | Status: DC
  Administered 2019-07-21: 12 10*6.[IU] via INTRAVENOUS
  Filled 2019-07-21 (×3): qty 12

## 2019-07-21 MED ORDER — METOPROLOL SUCCINATE ER 25 MG PO TB24
25.0000 mg | ORAL_TABLET | Freq: Every day | ORAL | Status: DC
  Administered 2019-07-21: 25 mg via ORAL
  Filled 2019-07-21: qty 1

## 2019-07-21 NOTE — Progress Notes (Signed)
RN to place MD order for PICC.

## 2019-07-21 NOTE — Discharge Summary (Signed)
Name: [name] MRN: [id number] DOB: 1945-06-07 74 y.o. PCP: [name], MD  Date of Admission: 07/17/2019  9:14 PM Date of Transfer: 07/21/2019 Attending Physician: Lucious Groves, DO  Discharge Diagnosis: 1. Vegetation of mechanical device (pacer wire) 2. Sepsis due to GBS bacteremia 3. Stage III sacral decubitus ulcers 4. HFpEF 5. Type 2 DM 6. Hypertension  Hospital Course: 74 yo female with T2DM, Aortic stenosis s/p TAVR, CKD, HFpEF and complete heart block s/p pacemaker placement. She presented to [facility]19 for progressive generalized weakness. Workup in the ED revealed sepsis and CXR revealed bilateral pulmonary infiltrates. Blood cultures were obtained and empiric therapy with azithromycin and rocephin were started. She was also noted to have stage III sacral pressure ulcers that were non-infectious appearing. Blood cultures returned + for group B streptococcus for which antibiotics were changed to cefazolin. TTE was obtained which did not reveal acute findings however, due to her numerous mechanical cardiac devices, we obtained a TEE which revealed a vegetation of her pacer wire. ID and EP were consulted. Infectious disease recommended removal of the pacemaker given agitation. EP was consulted and agreed. Unfortunately we were not able to do a device traction at [facility]. She was transferred to [facility] for device extraction. Dr. Remus Blake is the excepting physician.   Discharge Vitals:   BP (!) 172/86   Pulse 85   Temp 98.5 F (36.9 C)   Resp 18   Ht $R'5\' 6"'MJ$  (1.676 m)   Wt 76.7 kg   SpO2 98%   BMI 27.28 kg/m   Pertinent Labs, Studies, and Procedures:  BMP Latest Ref Rng & Units 07/21/2019 07/20/2019 07/19/2019  Glucose 70 - 99 mg/dL 209(H) 239(H) 140(H)  BUN 8 - 23 mg/dL 20 26(H) 27(H)  Creatinine 0.44 - 1.00 mg/dL 1.35(H) 1.56(H) 1.55(H)  BUN/Creat Ratio 12 - 28 - - -  Sodium 135 - 145 mmol/L 139 137 137  Potassium 3.5 - 5.1 mmol/L 3.6 3.6 3.7   Chloride 98 - 111 mmol/L 102 98 99  CO2 22 - 32 mmol/L $RemoveB'29 27 27  'etCgwisb$ Calcium 8.9 - 10.3 mg/dL 8.5(L) 8.6(L) 8.4(L)   CBC Latest Ref Rng & Units 07/20/2019 07/19/2019 07/17/2019  WBC 4.0 - 10.5 K/uL 9.2 16.1(H) 18.9(H)  Hemoglobin 12.0 - 15.0 g/dL 10.3(L) 10.3(L) 12.8  Hematocrit 36.0 - 46.0 % 32.4(L) 32.2(L) 40.0  Platelets 150 - 400 K/uL 193 184 256   TTE 07/18/2019 1. There is severe LVH with a small LVOT gradient present . Left  ventricular ejection fraction, by estimation, is 60 to 65%. The left  ventricle has normal function. The left ventricle has no regional wall  motion abnormalities. There is severe concentric  left ventricular hypertrophy. Left ventricular diastolic parameters are  consistent with Grade II diastolic dysfunction (pseudonormalization).  2. Right ventricular systolic function is mildly reduced. The right  ventricular size is normal. There is normal pulmonary artery systolic  pressure.  3. Left atrial size was severely dilated.  4. Right atrial size was mildly dilated.  5. The mitral valve is abnormal. No evidence of mitral valve  regurgitation. Moderate to severe mitral stenosis.  6. The aortic valve has been repaired/replaced. Aortic valve  regurgitation is trivial.   TEE 07/20/2019 1. There are 2 moderate sized linear mobile echodensities attached to  pacemaker lead consistent with vegetation.  2. Left ventricular ejection fraction, by estimation, is 60 to 65%. The  left ventricle has normal function. The left ventricle has no regional  wall motion abnormalities. There is moderate left ventricular hypertrophy.  3. Right ventricular systolic function is normal. The right ventricular  size is normal.  4. No left atrial/left atrial appendage thrombus was detected.  5. The mitral valve is degenerative. No evidence of mitral valve  regurgitation. Moderate mitral stenosis. The mean mitral valve gradient is  8.0 mmHg.  6. Medtronic Evolut Pro Plus 23 mm  transcatheter heart valve (VALVE IN  VALVE).   There is trivial to mild perivalvular leakage in 12-1 o'clock position  on TEE (posterior). Seen on limited transthoracic ECHO post op. No rocking  motion. . The aortic valve has been repaired/replaced. Aortic valve  regurgitation is not visualized. No  aortic stenosis is present. There is a 23 mm Medtronic, stented (TAVR)  valve present in the aortic position. Aortic valve mean gradient measures  14.0 mmHg. Aortic valve Vmax measures 2.61 m/s.  7. The inferior vena cava is normal in size with greater than 50%  respiratory variability, suggesting right atrial pressure of 3 mmHg.   Signed: Ina Homes, MD 07/21/2019, 3:25 PM   Pager: [phone]

## 2019-07-21 NOTE — Progress Notes (Signed)
[facility]  Date of Admission:  07/17/2019      Total days of antibiotics .3  Day 1 Penicillin     ASSESSMENT: [name] is a 74 y.o. female with group b streptococcal bacteremia with 2 linear vegetations noted on pacer wire. Discussed with EP and will plan transfer to [facility] for extraction to help with timely treatment. Will need temp pacer as she is completely dependent. Will narrow to continuous penicillin infusion now.     PLAN: 1. Narrow to penicillin infusion  2. Transfer to [facility] for extraction of infected PPM system    Principal Problem:   Infection of pacemaker lead wire [facility]) Active Problems:   Bacteremia due to group B Streptococcus   DM (diabetes mellitus) ([place])   Aortic stenosis s/p Tissure AVR 2006   CAD (coronary artery disease)   Bilateral lower extremity edema   Chronic diastolic congestive heart failure ([place])   Septic encephalopathy   Sacral decubitus ulcer, stage II ([place])   Sepsis ([place])   . amLODipine  5 mg Oral Daily  . aspirin EC  81 mg Oral Daily  . atorvastatin  80 mg Oral QHS  . clopidogrel  75 mg Oral Q breakfast  . enoxaparin (LOVENOX) injection  40 mg Subcutaneous Daily  . furosemide  40 mg Oral BID  . Gerhardt's butt cream   Topical TID AC & HS  . insulin aspart  0-9 Units Subcutaneous TID WC  . insulin aspart  3 Units Subcutaneous TID WC  . insulin detemir  10 Units Subcutaneous QHS  . PARoxetine  30 mg Oral Daily  . senna-docusate  1 tablet Oral BID    SUBJECTIVE: Sweet has no complaints today. Slept well. Feeling better but still weak overall.  Afebrile, no leukocytosis.    Review of Systems: Review of Systems  Constitutional: Negative for chills and fever.  HENT: Negative for tinnitus.   Eyes: Negative for blurred vision and photophobia.  Respiratory: Negative for cough and sputum production.   Cardiovascular: Negative for chest pain.  Gastrointestinal: Negative for diarrhea, nausea and  vomiting.  Genitourinary: Negative for dysuria.  Skin: Negative for rash.  Neurological: Positive for weakness. Negative for headaches.     Allergies  Allergen Reactions  . 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," as the patient is a Jehov[employer]'s Witness    OBJECTIVE: Vitals:   07/20/19 1305 07/20/19 1637 07/20/19 2106 07/21/19 0835  BP: (!) 187/72 (!) 161/67 (!) 168/78 (!) 160/78  Pulse: 87 86 95 90  Resp: $Remo'18 15 18 18  'HHGWo$ Temp:  98.7 F (37.1 C) 98.5 F (36.9 C) 98.5 F (36.9 C)  TempSrc:      SpO2: 95% 96% 94% 98%  Weight:      Height:       Body mass index is 27.28 kg/m.   Physical Exam Vitals reviewed.  Constitutional:      Appearance: She is well-developed.     Comments: Seated comfortably in the bed.   HENT:     Mouth/Throat:     Mouth: No oral lesions.     Dentition: Normal dentition. No dental abscesses.     Pharynx: No oropharyngeal exudate.  Cardiovascular:     Rate and Rhythm: Normal rate and regular rhythm.     Heart sounds: Normal heart sounds.  Pulmonary:     Effort: Pulmonary effort is normal.     Breath sounds: Normal breath sounds.  Abdominal:  General: There is no distension.     Palpations: Abdomen is soft.     Tenderness: There is no abdominal tenderness.  Lymphadenopathy:     Cervical: No cervical adenopathy.  Skin:    General: Skin is warm and dry.     Comments: Did not assess sacral area today  Neurological:     Mental Status: She is alert and oriented to person, place, and time.  Psychiatric:        Judgment: Judgment normal.     Lab Results Lab Results  Component Value Date   WBC 9.2 07/20/2019   HGB 10.3 (L) 07/20/2019   HCT 32.4 (L) 07/20/2019   MCV 93.9 07/20/2019   PLT 193 07/20/2019    Lab Results  Component Value Date   CREATININE 1.35 (H) 07/21/2019   BUN 20 07/21/2019   NA 139 07/21/2019   K 3.6 07/21/2019   CL 102 07/21/2019   CO2 29 07/21/2019    Lab Results  Component Value Date   ALT 13  07/17/2019   AST 17 07/17/2019   ALKPHOS 141 (H) 07/17/2019   BILITOT 0.8 07/17/2019     Microbiology: Recent Results (from the past 240 hour(s))  Urine culture     Status: Abnormal   Collection Time: 07/17/19  9:37 PM   Specimen: In/Out Cath Urine  Result Value Ref Range Status   Specimen Description IN/OUT CATH URINE  Final   Special Requests   Final    NONE Performed at [facility], [address], [place], [place] [address]    Culture MULTIPLE SPECIES PRESENT, SUGGEST RECOLLECTION (A)  Final   Report Status 07/19/2019 FINAL  Final  Blood Culture (routine x 2)     Status: Abnormal   Collection Time: 07/17/19  9:50 PM   Specimen: BLOOD  Result Value Ref Range Status   Specimen Description BLOOD RIGHT ANTECUBITAL  Final   Special Requests   Final    BOTTLES DRAWN AEROBIC AND ANAEROBIC Blood Culture adequate volume   Culture  Setup Time   Final    GRAM POSITIVE COCCI IN CHAINS IN BOTH AEROBIC AND ANAEROBIC BOTTLES Organism ID to follow CRITICAL RESULT CALLED TO, READ BACK BY AND VERIFIED WITH: Ferne Coe PharmD 11:25 07/18/19 ([email or web address]) Performed at [facility], [address] [place], [place] [address]    Culture GROUP B STREP(S.AGALACTIAE)ISOLATED (A)  Final   Report Status 07/21/2019 FINAL  Final   Organism ID, Bacteria GROUP B STREP(S.AGALACTIAE)ISOLATED  Final      Susceptibility   Group b strep(s.agalactiae)isolated - MIC*    CLINDAMYCIN <=0.25 SENSITIVE Sensitive     AMPICILLIN <=0.25 SENSITIVE Sensitive     ERYTHROMYCIN <=0.12 SENSITIVE Sensitive     VANCOMYCIN 0.5 SENSITIVE Sensitive     CEFTRIAXONE <=0.12 SENSITIVE Sensitive     LEVOFLOXACIN 0.5 SENSITIVE Sensitive     PENICILLIN Value in next row Sensitive      SENSITIVEMIC <=0.06    * GROUP B STREP(S.AGALACTIAE)ISOLATED  Blood Culture ID Panel (Reflexed)     Status: Abnormal   Collection Time: 07/17/19  9:50 PM  Result Value Ref Range Status   Enterococcus species NOT DETECTED NOT DETECTED  Final   Listeria monocytogenes NOT DETECTED NOT DETECTED Final   Staphylococcus species NOT DETECTED NOT DETECTED Final   Staphylococcus aureus (BCID) NOT DETECTED NOT DETECTED Final   Streptococcus species DETECTED (A) NOT DETECTED Final    Comment: CRITICAL RESULT CALLED TO, READ BACK BY AND  VERIFIED WITH: Ferne Coe PharmD 11:25 07/18/19 ([email or web address])    Streptococcus agalactiae DETECTED (A) NOT DETECTED Final    Comment: CRITICAL RESULT CALLED TO, READ BACK BY AND VERIFIED WITH: Ferne Coe PharmD 11:25 07/18/19 ([email or web address])    Streptococcus pneumoniae NOT DETECTED NOT DETECTED Final   Streptococcus pyogenes NOT DETECTED NOT DETECTED Final   Acinetobacter baumannii NOT DETECTED NOT DETECTED Final   Enterobacteriaceae species NOT DETECTED NOT DETECTED Final   Enterobacter cloacae complex NOT DETECTED NOT DETECTED Final   Escherichia coli NOT DETECTED NOT DETECTED Final   Klebsiella oxytoca NOT DETECTED NOT DETECTED Final   Klebsiella pneumoniae NOT DETECTED NOT DETECTED Final   Proteus species NOT DETECTED NOT DETECTED Final   Serratia marcescens NOT DETECTED NOT DETECTED Final   Haemophilus influenzae NOT DETECTED NOT DETECTED Final   Neisseria meningitidis NOT DETECTED NOT DETECTED Final   Pseudomonas aeruginosa NOT DETECTED NOT DETECTED Final   Candida albicans NOT DETECTED NOT DETECTED Final   Candida glabrata NOT DETECTED NOT DETECTED Final   Candida krusei NOT DETECTED NOT DETECTED Final   Candida parapsilosis NOT DETECTED NOT DETECTED Final   Candida tropicalis NOT DETECTED NOT DETECTED Final    Comment: Performed at [facility], [place]. [address], [place], [place] [address]  SARS Coronavirus 2 by RT PCR (hospital order, performed in [facility] hospital lab) Nasopharyngeal Nasopharyngeal Swab     Status: None   Collection Time: 07/17/19 10:10 PM   Specimen: Nasopharyngeal Swab  Result Value Ref Range Status   SARS Coronavirus 2 NEGATIVE NEGATIVE Final    Comment:  (NOTE) SARS-CoV-2 target nucleic acids are NOT DETECTED. The SARS-CoV-2 RNA is generally detectable in upper and lower respiratory specimens during the acute phase of infection. The lowest concentration of SARS-CoV-2 viral copies this assay can detect is 250 copies / mL. A negative result does not preclude SARS-CoV-2 infection and should not be used as the sole basis for treatment or other patient management decisions.  A negative result may occur with improper specimen collection / handling, submission of specimen other than nasopharyngeal swab, presence of viral mutation(s) within the areas targeted by this assay, and inadequate number of viral copies (<250 copies / mL). A negative result must be combined with clinical observations, patient history, and epidemiological information. Fact Sheet for Patients:   StrictlyIdeas.no Fact Sheet for Healthcare Providers: BankingDealers.co.za This test is not yet approved or cleared  by the [place] FDA and has been authorized for detection and/or diagnosis of SARS-CoV-2 by FDA under an Emergency Use Authorization (EUA).  This EUA will remain in effect (meaning this test can be used) for the duration of the COVID-19 declaration under Section 564(b)(1) of the Act, 21 U.S.C. section 360bbb-3(b)(1), unless the authorization is terminated or revoked sooner. Performed at [facility], [place] [address], [place], [place] [address]   Respiratory Panel by PCR     Status: None   Collection Time: 07/18/19  3:14 AM   Specimen: Nasopharyngeal Swab; Respiratory  Result Value Ref Range Status   Adenovirus NOT DETECTED NOT DETECTED Final   Coronavirus 229E NOT DETECTED NOT DETECTED Final    Comment: (NOTE) The Coronavirus on the Respiratory Panel, DOES NOT test for the novel  Coronavirus (2019 nCoV)    Coronavirus HKU1 NOT DETECTED NOT DETECTED Final   Coronavirus NL63 NOT DETECTED NOT DETECTED  Final   Coronavirus OC43 NOT DETECTED NOT DETECTED Final   Metapneumovirus NOT DETECTED NOT DETECTED Final   Rhinovirus /  Enterovirus NOT DETECTED NOT DETECTED Final   Influenza A NOT DETECTED NOT DETECTED Final   Influenza B NOT DETECTED NOT DETECTED Final   Parainfluenza Virus 1 NOT DETECTED NOT DETECTED Final   Parainfluenza Virus 2 NOT DETECTED NOT DETECTED Final   Parainfluenza Virus 3 NOT DETECTED NOT DETECTED Final   Parainfluenza Virus 4 NOT DETECTED NOT DETECTED Final   Respiratory Syncytial Virus NOT DETECTED NOT DETECTED Final   Bordetella pertussis NOT DETECTED NOT DETECTED Final   Chlamydophila pneumoniae NOT DETECTED NOT DETECTED Final   Mycoplasma pneumoniae NOT DETECTED NOT DETECTED Final    Comment: Performed at [facility], [place] [address], [place], [place] [address]  Blood Culture (routine x 2)     Status: None (Preliminary result)   Collection Time: 07/18/19 11:13 AM   Specimen: BLOOD  Result Value Ref Range Status   Specimen Description BLOOD SITE NOT SPECIFIED  Final   Special Requests   Final    BOTTLES DRAWN AEROBIC AND ANAEROBIC Blood Culture results may not be optimal due to an excessive volume of blood received in culture bottles   Culture   Final    NO GROWTH 3 DAYS Performed at [facility], [place] [address], [place], [place] [address]    Report Status PENDING  Incomplete  Culture, blood (routine x 2)     Status: None (Preliminary result)   Collection Time: 07/20/19  7:54 AM   Specimen: BLOOD LEFT HAND  Result Value Ref Range Status   Specimen Description BLOOD LEFT HAND  Final   Special Requests   Final    BOTTLES DRAWN AEROBIC AND ANAEROBIC Blood Culture adequate volume   Culture   Final    NO GROWTH 1 DAY Performed at [facility], [place] [address], [place], [place] [address]    Report Status PENDING  Incomplete  Culture, blood (routine x 2)     Status: None (Preliminary result)   Collection Time: 07/20/19  7:56 AM   Specimen:  BLOOD RIGHT ARM  Result Value Ref Range Status   Specimen Description BLOOD RIGHT ARM  Final   Special Requests   Final    BOTTLES DRAWN AEROBIC AND ANAEROBIC Blood Culture adequate volume   Culture   Final    NO GROWTH 1 DAY Performed at [facility], [place] [address], [place], [place] [address]    Report Status PENDING  Incomplete    Janene Madeira, MSN, NP-C [facility] Infectious Disease [email or web address] Pager: [phone] Office: [place]: [phone]

## 2019-07-21 NOTE — Progress Notes (Signed)
Brief History:   [name] is a26 year old female with apast medical history significant for hypertension,hyperlipidemia,insulin-dependent diabetes mellitus, aortic stenosis s/p TAVR 02/2019, chronic kidney diseasestage III, andHFpEFwho presented to the emergency department with a 3 day history ofprogressive confusion and generalized weakness.  Subjective:  [name] was evaluated and examined at bedside this morning. She reports that she is feeling well and did not report any overnight events. She denies any fever, chills, or chest pain.   We discussed the finding of the TEE with the patient, and that she will need long-term antibiotics. Using the teachback method, patient was able to recount the possibility that she may be transferred to [facility] for extraction of pacemaker wire.  Objective:  Vital signs in last 24 hours: Vitals:   07/20/19 1305 07/20/19 1637 07/20/19 2106 07/21/19 0835  BP: (!) 187/72 (!) 161/67 (!) 168/78 (!) 160/78  Pulse: 87 86 95 90  Resp: $Remo'18 15 18 18  'Vxahs$ Temp:  98.7 F (37.1 C) 98.5 F (36.9 C) 98.5 F (36.9 C)  TempSrc:      SpO2: 95% 96% 94% 98%  Weight:      Height:       Weight change:   Intake/Output Summary (Last 24 hours) at 07/21/2019 1110 Last data filed at 07/21/2019 0923 Gross per 24 hour  Intake 400 ml  Output 1800 ml  Net -1400 ml   Physical Exam  Constitutional: She is oriented to person, place, and time. No distress.  HENT:  Head: Normocephalic and atraumatic.  Eyes: EOM are normal.  Cardiovascular: Normal rate and regular rhythm. Exam reveals no gallop and no friction rub.  Murmur heard. III/VI systolic murmur.  Pulmonary/Chest: Effort normal and breath sounds normal. No respiratory distress. She has no wheezes.  Abdominal: Soft. Bowel sounds are normal. She exhibits no distension. There is no abdominal tenderness.  Neurological: She is alert and oriented to person, place, and time.  Skin: Skin is warm and  dry. She is not diaphoretic.  Psychiatric: Mood and affect normal.     Assessment/Plan:  Principal Problem:   Sepsis ([place]) Active Problems:   DM (diabetes mellitus) ([place])   Aortic stenosis s/p Tissure AVR 2006   CAD (coronary artery disease)   Bilateral lower extremity edema   Chronic diastolic congestive heart failure ([place])   Septic encephalopathy   Sacral decubitus ulcer, stage II ([place])   Bacteremia due to group B Streptococcus   [name] is a63 year old female with apast medical history significant for hypertension,hyperlipidemia,insulin-dependent diabetes mellitus, aortic stenosis s/p TAVR 02/2019, chronic kidney diseasestage III, andHFpEFwho presented to the emergency department with a 3 day history ofprogressive confusion and generalized weakness.   # Sepsis # Streptococcus Agalactiae bacteremia # Sacral Decubitus Ulcers Patient reported to the emergency department with AMS, and generalized weakness. Patient had a fever at 105 on admission. Patient denies any fever, chills, or chest pain. Blood cultures were positive for strep. Agalactiae, repeat blood cultures pending. Initially treating bacteremia with cefazolin. TEE revealed possible vegetation of pacemaker wire. EP consulted and following. ID consulted and following. Assessment:Patients initial presentation and lab values in the setting of bacteremia are consistent with sepsis. Patient appears to be progressing well, she has remained afebrile since admission and does not endorse fever or chills. Spoke with ID, and per EP (Dr. Curt Bears) note, patient will need extraction of pacemaker wire. She may need to be transferred to [facility] for this procedure to be done. ID  recommend switch to Penicillin G for targeted antibiotic therapy and placement of PICC for long-term IV antibiotic requirement. Plan: - Switch anitbiotic coverage to Penicillin G, per ID recommendation. - Awaiting blood culture results. -  Potential transfer to [facility] Per EP physician, Dr. Curt Bears, he will discuss with cardiology at [facility] for transfer for device extraction. - IV team consulted for placement of PICC line. - Monitor Vitals. - Continue wound dressings per wound care recommendations for sacral decubitus ulcerations.  # Chronic Heart Failure with preserved EF Patient has chronic diastolic heart failure. ECHO performed yesterday calculated EF of 60 - 65%. Started home dose of lasix yesterday and patient is diuresing well. Patient wearing unna boots in bed this morning. - Strict Ins and Outs. - Daily Weights. - Continue metoprolol. - Continue Lasix 40 mg BID. - Continue unna boot.   # Type II Diabetes Mellitus Uncontrolled with Hyperglycemia Last A1c was 8.6 on 05/19/2019.Outpatient medications include the following: degludec-liraglutide 10 units daily.Patient's most recent glucose was 147. Patient has been consistently hyperglycemic since admission. - Continue Levemir to 10 units at bedtime. - Continue 3 Units of Novolog with meals, if patient eats more that 50% of meal. - SSI - very sensitive. - Continue to monitor CBG.  # Hypertension Patient has a history of sever exacerbations of HTN.Current medications are Hydralazine 50 mg  3 times daily, amlodipine 5 daily.Blood pressure this morning is 160/78. - Continue home amlodipine 5 mg once a day. - Will monitor BP regularly.  # Abdominal Distention Patient reports resolution of abdominal tightness and discomfort. - Continue senna-docusate.  Code Status: Full code Diet: Full: Heart Healthy VTE prophylaxis: Lovenox     LOS: 3 days   [name], Medical Student 07/21/2019, 11:10 AM

## 2019-07-21 NOTE — Progress Notes (Signed)
   07/21/19 2108  Section 1: Provider Certification  Patient Condition Patient stabilized;Benefit outweighs risk;No emergency medical condition present  Reason for Transfer device extraction  Benefits of Transfer device extraction  Risks of Transfer Potential deterioration of medical condition  Level of Care BLS  Accepting Physician Dr.Whalen  Sending Physician [place]  Physician Assessment Patient examined and risks and benefits explained  Section 2: Clinician Certification  Accepting hospital or facility Available service confirmed;Available space confirmed;Available personnel confirmed  Accepting [place]  Copies of Medical Records Sent H & P;Transfer Form;Progress Note;Labs  Patient Belongings Disposition Given to transport service (Comment)  E - Vitals (15 min before transfer)  Temp 98.5 F (36.9 C)  ECG Heart Rate 74  Pulse Rate 75  Resp 19  BP (!) 154/65  SpO2 95 %  Section 4: Provider Reassessment (within 1 hour of departure)  Physician Reassessment Reassessment completed prior to transfer (Provider Only)

## 2019-07-21 NOTE — Progress Notes (Addendum)
Electrophysiology Rounding Note  Patient Name: [name] Date of Encounter: 07/21/2019  Primary Cardiologist: [name], MD Electrophysiologist: Joyclyn Plazola Meredith Leeds, MD   Subjective   The patient is doing well today.  At this time, the patient denies chest pain, shortness of breath, or any new concerns.  Inpatient Medications    Scheduled Meds:  amLODipine  5 mg Oral Daily   aspirin EC  81 mg Oral Daily   atorvastatin  80 mg Oral QHS   clopidogrel  75 mg Oral Q breakfast   enoxaparin (LOVENOX) injection  40 mg Subcutaneous Daily   furosemide  40 mg Oral BID   Gerhardt's butt cream   Topical TID AC & HS   insulin aspart  0-9 Units Subcutaneous TID WC   insulin aspart  3 Units Subcutaneous TID WC   insulin detemir  10 Units Subcutaneous QHS   PARoxetine  30 mg Oral Daily   senna-docusate  1 tablet Oral BID   Continuous Infusions:  penicillin g continuous IV infusion     PRN Meds: acetaminophen   Vital Signs    Vitals:   07/20/19 1305 07/20/19 1637 07/20/19 2106 07/21/19 0835  BP: (!) 187/72 (!) 161/67 (!) 168/78 (!) 160/78  Pulse: 87 86 95 90  Resp: $Remo'18 15 18 18  'tDbBI$ Temp:  98.7 F (37.1 C) 98.5 F (36.9 C) 98.5 F (36.9 C)  TempSrc:      SpO2: 95% 96% 94% 98%  Weight:      Height:        Intake/Output Summary (Last 24 hours) at 07/21/2019 1026 Last data filed at 07/21/2019 0521 Gross per 24 hour  Intake 400 ml  Output 1800 ml  Net -1400 ml   Filed Weights   07/17/19 2119  Weight: 76.7 kg    Physical Exam    GEN- The patient is well appearing, alert and oriented x 3 today.   Head- normocephalic, atraumatic Eyes-  Sclera clear, conjunctiva pink Ears- hearing intact Oropharynx- clear Neck- supple Lungs- Clear to ausculation bilaterally, normal work of breathing Heart- Regular rate and rhythm, no murmurs, rubs or gallops GI- soft, NT, ND, + BS Extremities- no clubbing, cyanosis, or edema Skin- no rash or lesion Psych- euthymic mood,  full affect Neuro- strength and sensation are intact  Labs    CBC Recent Labs    07/19/19 0219 07/20/19 0723  WBC 16.1* 9.2  HGB 10.3* 10.3*  HCT 32.2* 32.4*  MCV 93.9 93.9  PLT 184 314   Basic Metabolic Panel Recent Labs    07/20/19 0301 07/21/19 0356  NA 137 139  K 3.6 3.6  CL 98 102  CO2 27 29  GLUCOSE 239* 209*  BUN 26* 20  CREATININE 1.56* 1.35*  CALCIUM 8.6* 8.5*  MG 1.8  --    Liver Function Tests No results for input(s): AST, ALT, ALKPHOS, BILITOT, PROT, ALBUMIN in the last 72 hours. No results for input(s): LIPASE, AMYLASE in the last 72 hours. Cardiac Enzymes No results for input(s): CKTOTAL, CKMB, CKMBINDEX, TROPONINI in the last 72 hours.   Telemetry    V paced 70s (personally reviewed)  Radiology    ECHO TEE  Result Date: 07/20/2019    TRANSESOPHOGEAL ECHO REPORT   Patient Name:   [name] Date of Exam: 07/20/2019 Medical Rec #:  [id number]        Height:       66.0 in Accession #:    [id number]  Weight:       169.0 lb Date of Birth:  14-Sep-1945        BSA:          1.862 m Patient Age:    74 years         BP:           175/72 mmHg Patient Gender: F                HR:           73 bpm. Exam Location:  Inpatient Procedure: Transesophageal Echo, Color Doppler and Cardiac Doppler Indications:     Endocarditis  History:         Patient has prior history of Echocardiogram examinations, most                  recent 07/18/2019. CAD (coronary artery disease), 03/01/19                  valve-in-valve TAVR with, Stroke; Risk Factors:Dyslipidemia and                  Hypertension. Bacteremia due to group B Streptococcus.                  Aortic Valve: 23 mm Medtronic, stented (TAVR) valve is present                  in the aortic position.  Sonographer:     Vikki Ports Turrentine Referring Phys:  [id number] Leanor Kail Diagnosing Phys: Candee Furbish MD PROCEDURE: The transesophogeal probe was passed without difficulty through the esophogus of the patient.  Sedation performed by different physician. The patient was monitored while under deep sedation. Anesthestetic sedation was provided intravenously by Anesthesiology: 279mg  of Propofol. The patient's vital signs; including heart rate, blood pressure, and oxygen saturation; remained stable throughout the procedure. The patient developed no complications during the procedure. IMPRESSIONS  1. There are 2 moderate sized linear mobile echodensities attached to pacemaker lead consistent with vegetation.  2. Left ventricular ejection fraction, by estimation, is 60 to 65%. The left ventricle has normal function. The left ventricle has no regional wall motion abnormalities. There is moderate left ventricular hypertrophy.  3. Right ventricular systolic function is normal. The right ventricular size is normal.  4. No left atrial/left atrial appendage thrombus was detected.  5. The mitral valve is degenerative. No evidence of mitral valve regurgitation. Moderate mitral stenosis. The mean mitral valve gradient is 8.0 mmHg.  6. Medtronic Evolut Pro Plus 23 mm transcatheter heart valve (VALVE IN VALVE).     There is trivial to mild perivalvular leakage in 12-1 o'clock position on TEE (posterior). Seen on limited transthoracic ECHO post op. No rocking motion. . The aortic valve has been repaired/replaced. Aortic valve regurgitation is not visualized. No aortic stenosis is present. There is a 23 mm Medtronic, stented (TAVR) valve present in the aortic position. Aortic valve mean gradient measures 14.0 mmHg. Aortic valve Vmax measures 2.61 m/s.  7. The inferior vena cava is normal in size with greater than 50% respiratory variability, suggesting right atrial pressure of 3 mmHg. Conclusion(s)/Recommendation(s): Findings are concerning for vegetation/infective endocarditis as detailed above. FINDINGS  Left Ventricle: Left ventricular ejection fraction, by estimation, is 60 to 65%. The left ventricle has normal function. The left  ventricle has no regional wall motion abnormalities. The left ventricular internal cavity size was normal in size. There is  moderate left ventricular hypertrophy. Right Ventricle: The right  ventricular size is normal. No increase in right ventricular wall thickness. Right ventricular systolic function is normal. Left Atrium: Left atrial size was normal in size. No left atrial/left atrial appendage thrombus was detected. Right Atrium: Right atrial size was normal in size. Pericardium: There is no evidence of pericardial effusion. Mitral Valve: The mitral valve is degenerative in appearance. There is moderate thickening of the mitral valve leaflet(s). There is moderate calcification of the mitral valve leaflet(s). Normal mobility of the mitral valve leaflets. Moderate to severe mitral annular calcification. No evidence of mitral valve regurgitation. Moderate mitral valve stenosis. MV peak gradient, 17.0 mmHg. The mean mitral valve gradient is 8.0 mmHg. Tricuspid Valve: The tricuspid valve is normal in structure. Tricuspid valve regurgitation is not demonstrated. No evidence of tricuspid stenosis. Aortic Valve: Medtronic Evolut Pro Plus 23 mm transcatheter heart valve (VALVE IN VALVE). There is trivial to mild perivalvular leakage in 12-1 o'clock position on TEE (posterior). Seen on limited transthoracic ECHO post op. No rocking motion. The aortic valve has been repaired/replaced. Aortic valve regurgitation is not visualized. No aortic  stenosis is present. Aortic valve mean gradient measures 14.0 mmHg. Aortic valve peak gradient measures 27.2 mmHg. There is a 23 mm Medtronic, stented (TAVR) valve present in the aortic position. Pulmonic Valve: The pulmonic valve was normal in structure. Pulmonic valve regurgitation is not visualized. No evidence of pulmonic stenosis. Aorta: The aortic root is normal in size and structure. Venous: The inferior vena cava is normal in size with greater than 50% respiratory variability,  suggesting right atrial pressure of 3 mmHg. IAS/Shunts: No atrial level shunt detected by color flow Doppler. Additional Comments: There are 2 moderate sized linear mobile echodensities attached to pacemaker lead consistent with vegetation. A pacer wire is visualized.  AORTIC VALVE AV Vmax:      261.00 cm/s AV Vmean:     173.000 cm/s AV VTI:       0.416 m AV Peak Grad: 27.2 mmHg AV Mean Grad: 14.0 mmHg MITRAL VALVE MV Peak grad: 17.0 mmHg MV Mean grad: 8.0 mmHg MV Vmax:      2.06 m/s MV Vmean:     122.0 cm/s Candee Furbish MD Electronically signed by Candee Furbish MD Signature Date/Time: 07/20/2019/4:42:28 PM    Final     Patient Profile     [name] is a 74 y.o. female with a history of HTN, HLD, IDDM, AS s/p TAVR 02/2019, CHB s/p MDT dual chamber PPM 01/2019, CKD III and HFpEF who is being seen today for the evaluation of PPM lead vegetation at the request of Dr. Heber [place].  Assessment & Plan    1.  CHB s/p Medtronic Dual chamber pacemaker 01/2019 Pt is pacer depedent and would require Temp-Perm pacer in the setting of extraction. Dr Curt Bears has discussed with ID, and would recommend transfer to [facility], as we would not be able to perform extraction here until later next week.  Device otherwise stable on check 07/20/2019   2. Aortic stenosis s/p TAVR 02/2019 TAVR stable per TEE. Reviewed with structural team.    3. Sepsis in setting of Streptococcus Agalactiae bacteremia Suspect sacral ulcers as etiology ID following.  Ultimately, pt [name] need device extraction with vegetation.    4. HTN Follow.   Kareemah Grounds obtain TEE images for transfer to [facility].   For questions or updates, please contact [place] Please consult [email or web address] for contact info under Cardiology/STEMI.  Signed, Audrey Friar, PA-C  07/21/2019, 10:26 AM   I  have seen and examined this patient with Oda Kilts.  Agree with above, note added to reflect my findings.  On exam, RRR, no murmurs, lungs clear.  Feeling improved but still weak. Have discussed with ID and recommend device extraction. Oday Ridings discuss with cardiology at [facility] to transfer for device extraction.    Marijose Curington M. Khang Hannum MD 07/21/2019 10:34 AM

## 2019-07-22 MED ORDER — INSULIN DETEMIR 100 UNIT/ML ~~LOC~~ SOLN: 10.0000 [IU] | Freq: Every day | SUBCUTANEOUS | 11 refills | Status: DC

## 2019-07-22 MED ORDER — GERHARDT'S BUTT CREAM: 1.0000 "application " | TOPICAL_CREAM | Freq: Three times a day (TID) | CUTANEOUS | Status: DC

## 2019-07-22 MED ORDER — PENICILLIN G POTASSIUM 20000000 UNITS IJ SOLR: 12.0000 10*6.[IU] | Freq: Two times a day (BID) | INTRAVENOUS | Status: DC

## 2019-07-22 MED ORDER — INSULIN ASPART 100 UNIT/ML ~~LOC~~ SOLN: 3.0000 [IU] | Freq: Three times a day (TID) | SUBCUTANEOUS | 11 refills | Status: DC

## 2019-07-22 MED ORDER — INSULIN ASPART 100 UNIT/ML ~~LOC~~ SOLN: 0.0000 [IU] | Freq: Three times a day (TID) | SUBCUTANEOUS | 11 refills | Status: DC

## 2019-07-22 MED ORDER — AMLODIPINE BESYLATE 5 MG PO TABS: 5.0000 mg | ORAL_TABLET | Freq: Every day | ORAL | Status: DC

## 2019-07-22 MED ORDER — ENOXAPARIN SODIUM 40 MG/0.4ML ~~LOC~~ SOLN: 40.0000 mg | Freq: Every day | SUBCUTANEOUS | Status: DC

## 2019-07-22 MED ORDER — AMLODIPINE BESYLATE 5 MG PO TABS
5.0000 mg | ORAL_TABLET | Freq: Every day | ORAL | Status: DC
  Filled 2019-07-22: qty 1

## 2019-07-22 NOTE — Progress Notes (Signed)
Report given to [place] LPN

## 2019-07-22 NOTE — Progress Notes (Signed)
[facility] RN was given update of report patient bp 184/71 hr md notifed rechex 169/66

## 2019-07-22 NOTE — Progress Notes (Signed)
   07/22/19 0157  Section 1: Provider Certification  Patient Condition Patient stabilized;Benefit outweighs risk;No emergency medical condition present  Reason for Transfer device extraction  Benefits of Transfer device extraction  Risks of Transfer potential deterioration of medical condition  Level of Care BLS  Accepting Physician Dr.Whalen  Sending Physician [place]  Physician Assessment Patient examined and risks and benefits explained  Section 2: Clinician Certification  Accepting hospital or facility Available service confirmed;Available space confirmed;Available personnel confirmed  [place]  Transport By [employer] of Medical Records Sent H & P;Labs;Progress Note  Patient Belongings Disposition Given to transport service (Comment)  E - Vitals (15 min before transfer)  Temp 97.8 F (36.6 C)  ECG Heart Rate 71  Pulse Rate 72  Resp 17  BP (!) 184/71  SpO2 98 %  Section 4: Provider Reassessment (within 1 hour of departure)  Physician Reassessment Reassessment completed prior to transfer (Provider Only)

## 2019-07-22 NOTE — Progress Notes (Signed)
MD notified that patient bp 184/71. Arthor Captain LPN

## 2019-07-23 DIAGNOSIS — E119 Type 2 diabetes mellitus without complications: Secondary | ICD-10-CM | POA: Insufficient documentation

## 2019-07-23 DIAGNOSIS — I639 Cerebral infarction, unspecified: Secondary | ICD-10-CM | POA: Insufficient documentation

## 2019-07-23 LAB — CULTURE, BLOOD (ROUTINE X 2): Culture: NO GROWTH

## 2019-07-25 LAB — CULTURE, BLOOD (ROUTINE X 2)
Culture: NO GROWTH
Culture: NO GROWTH
Special Requests: ADEQUATE
Special Requests: ADEQUATE

## 2019-07-25 NOTE — Anesthesia Postprocedure Evaluation (Signed)
Anesthesia Post Note  Patient: [name]  Procedure(s) Performed: TRANSESOPHAGEAL ECHOCARDIOGRAM (TEE) (N/A )     Patient location during evaluation: Endoscopy Anesthesia Type: MAC Level of consciousness: awake and alert Pain management: pain level controlled Vital Signs Assessment: post-procedure vital signs reviewed and stable Respiratory status: spontaneous breathing, nonlabored ventilation, respiratory function stable and patient connected to nasal cannula oxygen Cardiovascular status: stable and blood pressure returned to baseline Postop Assessment: no apparent nausea or vomiting Anesthetic complications: no    Last Vitals:  Vitals:   07/22/19 0157 07/22/19 0200  BP: (!) 184/71 (!) 169/66  Pulse: 72   Resp: 17   Temp: 36.6 C   SpO2: 98%     Last Pain:  Vitals:   07/22/19 0200  TempSrc:   PainSc: 7                  Ignacia Gentzler COKER

## 2019-07-29 ENCOUNTER — Encounter: Payer: Self-pay | Admitting: Internal Medicine

## 2019-07-29 ENCOUNTER — Non-Acute Institutional Stay (SKILLED_NURSING_FACILITY): Payer: Medicare Other | Admitting: Internal Medicine

## 2019-07-29 DIAGNOSIS — T827XXD Infection and inflammatory reaction due to other cardiac and vascular devices, implants and grafts, subsequent encounter: Secondary | ICD-10-CM

## 2019-07-29 DIAGNOSIS — N179 Acute kidney failure, unspecified: Secondary | ICD-10-CM

## 2019-07-29 DIAGNOSIS — E1121 Type 2 diabetes mellitus with diabetic nephropathy: Secondary | ICD-10-CM | POA: Diagnosis not present

## 2019-07-29 DIAGNOSIS — N1832 Chronic kidney disease, stage 3b: Secondary | ICD-10-CM

## 2019-07-29 DIAGNOSIS — Z794 Long term (current) use of insulin: Secondary | ICD-10-CM

## 2019-07-29 DIAGNOSIS — L89152 Pressure ulcer of sacral region, stage 2: Secondary | ICD-10-CM

## 2019-07-29 DIAGNOSIS — E1122 Type 2 diabetes mellitus with diabetic chronic kidney disease: Secondary | ICD-10-CM

## 2019-07-29 MED ORDER — CLOPIDOGREL BISULFATE 75 MG PO TABS
75.00 | ORAL_TABLET | ORAL | Status: DC
Start: 2019-07-29 — End: 2019-07-29

## 2019-07-29 MED ORDER — HYDROCODONE-ACETAMINOPHEN 5-325 MG PO TABS
1.00 | ORAL_TABLET | ORAL | Status: DC
Start: ? — End: 2019-07-29

## 2019-07-29 MED ORDER — BISACODYL 10 MG RE SUPP
10.00 | RECTAL | Status: DC
Start: ? — End: 2019-07-29

## 2019-07-29 MED ORDER — DEXTROSE 10 % IV SOLN
125.00 | INTRAVENOUS | Status: DC
Start: ? — End: 2019-07-29

## 2019-07-29 MED ORDER — GENERIC EXTERNAL MEDICATION
30.00 | Status: DC
Start: 2019-07-29 — End: 2019-07-29

## 2019-07-29 MED ORDER — GENERIC EXTERNAL MEDICATION
2.00 | Status: DC
Start: 2019-07-29 — End: 2019-07-29

## 2019-07-29 MED ORDER — NITROGLYCERIN 0.4 MG SL SUBL
0.40 | SUBLINGUAL_TABLET | SUBLINGUAL | Status: DC
Start: ? — End: 2019-07-29

## 2019-07-29 MED ORDER — METOPROLOL SUCCINATE ER 25 MG PO TB24
25.00 | ORAL_TABLET | ORAL | Status: DC
Start: 2019-07-29 — End: 2019-07-29

## 2019-07-29 MED ORDER — HEPARIN SODIUM (PORCINE) 5000 UNIT/ML IJ SOLN
5000.00 | INTRAMUSCULAR | Status: DC
Start: 2019-07-28 — End: 2019-07-29

## 2019-07-29 MED ORDER — ATORVASTATIN CALCIUM 40 MG PO TABS
80.00 | ORAL_TABLET | ORAL | Status: DC
Start: 2019-07-28 — End: 2019-07-29

## 2019-07-29 MED ORDER — AMLODIPINE BESYLATE 5 MG PO TABS
5.00 | ORAL_TABLET | ORAL | Status: DC
Start: 2019-07-29 — End: 2019-07-29

## 2019-07-29 MED ORDER — INSULIN GLARGINE 100 UNIT/ML ~~LOC~~ SOLN
15.00 | SUBCUTANEOUS | Status: DC
Start: 2019-07-29 — End: 2019-07-29

## 2019-07-29 MED ORDER — ACETAMINOPHEN 325 MG PO TABS
650.00 | ORAL_TABLET | ORAL | Status: DC
Start: ? — End: 2019-07-29

## 2019-07-29 MED ORDER — INSULIN LISPRO 100 UNIT/ML ~~LOC~~ SOLN
2.00 | SUBCUTANEOUS | Status: DC
Start: 2019-07-29 — End: 2019-07-29

## 2019-07-29 MED ORDER — ASPIRIN 81 MG PO TBEC
81.00 | DELAYED_RELEASE_TABLET | ORAL | Status: DC
Start: 2019-07-29 — End: 2019-07-29

## 2019-07-29 MED ORDER — HYDRALAZINE HCL 50 MG PO TABS
50.00 | ORAL_TABLET | ORAL | Status: DC
Start: 2019-07-28 — End: 2019-07-29

## 2019-07-29 MED ORDER — FUROSEMIDE 40 MG PO TABS
40.00 | ORAL_TABLET | ORAL | Status: DC
Start: 2019-07-28 — End: 2019-07-29

## 2019-07-29 MED ORDER — GLUCOSE 40 % PO GEL
15.00 | ORAL | Status: DC
Start: ? — End: 2019-07-29

## 2019-07-29 MED ORDER — MELATONIN 3 MG PO TABS
6.00 | ORAL_TABLET | ORAL | Status: DC
Start: ? — End: 2019-07-29

## 2019-07-29 MED ORDER — TAMSULOSIN HCL 0.4 MG PO CAPS
0.40 | ORAL_CAPSULE | ORAL | Status: DC
Start: 2019-07-29 — End: 2019-07-29

## 2019-07-29 NOTE — Assessment & Plan Note (Addendum)
Ceftriaxone 2 g every 24 hours 5/28-09/01/2019. CMP, CBC with differential every Monday while on antibiotics.  Fax results to OPAT team at [phone] ID follow-up 6/14 at 8 AM with Elby Showers, PA-C [phone].

## 2019-07-29 NOTE — Progress Notes (Signed)
NURSING HOME LOCATION:  [facility] ROOM NUMBER:  [id number]  CODE STATUS:  FULL CODE  PCP:  [name], MD  [place] [address]   This is a comprehensive admission note to [facility] performed on this date less than 30 days from date of admission. Included are preadmission medical/surgical history; reconciled medication list; family history; social history and comprehensive review of systems.  Corrections and additions to the records were documented. Comprehensive physical exam was also performed. Additionally a clinical summary was entered for each active diagnosis pertinent to this admission in the Problem List to enhance continuity of care.  HPI: Initially the patient was admitted to [facility] 5/23 and subsequently transferred on 5/27 to [facility]. She presented to the ED with progressive generalized weakness.  ED eval documented sepsis with bilateral pulmonary infiltrates on chest x-ray.  Empirically azithromycin and Rocephin were initiated following collection of blood cultures.  Stage III sacral pressure ulcer which did not appear clinically infected was present.   Blood cultures returned positive for group B streptococcus resulting in change the antibiotic therapy to cefazolin.   TTE revealed a vegetation on the pacer wire. Unfortunately the the device could not be extracted and she was transferred to [facility] for device extraction. At [facility] ID recommended initiating vancomycin pending repeat blood cultures.  Successful device and lead explant with Micra leadless pacemaker implant was completed without complication.   Course was complicated by AKI with creatinine up to 1.6.  ID monitored her daily and changed vancomycin to penicillin. AKI progressed with creatinine up to 2.1 from a baseline at admission of 1.3.  She was unable to void without and out cath.  Diuretic holiday was initiated and fluid intake was liberalized with mobilization as tolerated to  facilitate ability to void.  Urinalysis was unremarkable.  Renal ultrasound revealed increased echogenicity of the renal parenchyma which can be seen with medical renal disease.  There were no other acute findings.  With diuretic holiday the creatinine dropped to 1.67 on the day of discharge.  Intermittent straight catheterization was necessary because of urine retention. PICC line was placed on 6/3 for prolonged antibiotic therapy.  She was to continue to receive ceftriaxone 2 g every 24 hours beginning 5/28 and ending 09/01/2019.  Monitor as an outpatient would include CBC and differential and CMP every Monday with fax to the OPAT team at [phone] follow-up with OPAT was scheduled for 6/14 at 8 AM. Her A1c of 9% indicated poor diabetic control.  Insulin therapy was increased to 14 units daily at discharge.  Past medical and surgical history: Includes essential hypertension, dyslipidemia, history of stroke, osteoporosis, diverticulosis, insulin-dependent diabetes with neurovascular complications, CKD, CAD, severe aortic stenosis, history of breast cancer, and history of chronic diastolic congestive heart failure. Surgeries and procedures include aortic valve replacement, cholecystectomy, abdominal hysterectomy, CABG, left mastectomy, pacemaker implant, and subsequent aortic valve replacement in January of this year.  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  She formally drank socially but states she has quit completely; no history of smoking.  Family history: Extensive history reviewed.   Review of systems: Complete review of systems is essentially negative.  She does describe frequent frontal headaches which respond to Tylenol.  She states that she would check her glucoses at home irregularly.  Fasting glucoses would run 140-190.  She denied any hypoglycemia or other diabetic symptoms.  She specifically denied any peripheral neuropathy symptoms.  She validated that she is on Paxil but not depressed at this time.  She also knew that she had a sacral wound but was unaware of it.  She lives with her granddaughter and ambulates with a walker.  She is limited because of weakness related to previous stroke.  Constitutional: No fever, significant weight change, fatigue  Eyes: No redness, discharge, pain, vision change ENT/mouth: No nasal congestion, purulent discharge, earache, change in hearing, sore throat  Cardiovascular: No chest pain, palpitations, paroxysmal nocturnal dyspnea, claudication, edema  Respiratory: No cough, sputum production, hemoptysis, DOE, significant snoring, apnea  Gastrointestinal: No heartburn, dysphagia, abdominal pain, nausea /vomiting, rectal bleeding, melena, change in bowels Genitourinary: No dysuria, hematuria, pyuria, incontinence, nocturia Musculoskeletal: No joint stiffness, joint swelling, pain Dermatologic: No rash, pruritus, visualized change in appearance of skin Neurologic: No dizziness,  syncope, seizures, numbness, tingling Psychiatric: No significant anxiety, depression, insomnia, anorexia Endocrine: No change in hair/skin/nails, excessive thirst, excessive hunger, excessive urination  Hematologic/lymphatic: No significant bruising, lymphadenopathy, abnormal bleeding Allergy/immunology: No itchy/watery eyes, significant sneezing, urticaria, angioedema  Physical exam:  Pertinent or positive findings: Affect is somewhat flat but she is communicative.  She is missing anterior maxillary and mandibular teeth.  She has a rumbling 1.5 systolic murmur at the base.  Breath sounds decreased inferiorly.  Pedal pulses are decreased.  There are sclerotic hyperpigmented changes over the dorsum of the left toes and distal foot with minimal changes on the right as well.  The toenails are deformed and thickened.  Slight clubbing of the fingernails is suggested.  She is weak to opposition greater in the lower extremities,  especially the left leg.  General appearance: Adequately nourished; no acute distress, increased work of breathing is present.   Lymphatic: No lymphadenopathy about the head, neck, axilla. Eyes: No conjunctival inflammation or lid edema is present. There is no scleral icterus. Ears:  External ear exam shows no significant lesions or deformities.   Nose:  External nasal examination shows no deformity or inflammation. Nasal mucosa are pink and moist without lesions, exudates Oral exam: Lips and gums are healthy appearing.There is no oropharyngeal erythema or exudate. Neck:  No thyromegaly, masses, tenderness noted.    Heart:  Normal rate and regular rhythm. S1 and S2 normal without gallop, click, rub.  Lungs:  without wheezes, rhonchi, rales, rubs. Abdomen: Bowel sounds are normal.  Abdomen is soft and nontender with no organomegaly, hernias, masses. GU: Deferred  Extremities:  No cyanosis, edema. Neurologic exam: Balance, Rhomberg, finger to nose testing could not be completed due to clinical state Skin: Warm & dry w/o tenting.  See clinical summary under each active problem in the Problem List with associated updated therapeutic plan

## 2019-07-29 NOTE — Assessment & Plan Note (Signed)
Monitor renal function every Monday while on antibiotics 5/28-7/8.

## 2019-07-29 NOTE — Patient Instructions (Signed)
See assessment and plan under each diagnosis in the problem list and acutely for this visit 

## 2019-07-29 NOTE — Assessment & Plan Note (Addendum)
Poor diabetic control based on an A1c of 9%.  Xultophy ( insulin degludec and liraglutide) creased to 14 units.   Fasting blood sugar at [facility] 131, p.m. glucose 275.

## 2019-07-29 NOTE — Assessment & Plan Note (Signed)
Wound care nurse will monitor at [facility]

## 2019-08-02 ENCOUNTER — Encounter: Payer: Self-pay | Admitting: Internal Medicine

## 2019-08-02 ENCOUNTER — Other Ambulatory Visit: Payer: Self-pay

## 2019-08-02 ENCOUNTER — Ambulatory Visit (INDEPENDENT_AMBULATORY_CARE_PROVIDER_SITE_OTHER): Payer: Medicare Other | Admitting: Internal Medicine

## 2019-08-02 VITALS — BP 114/64 | HR 65 | Temp 98.4°F

## 2019-08-02 DIAGNOSIS — B951 Streptococcus, group B, as the cause of diseases classified elsewhere: Secondary | ICD-10-CM

## 2019-08-02 DIAGNOSIS — Z8673 Personal history of transient ischemic attack (TIA), and cerebral infarction without residual deficits: Secondary | ICD-10-CM | POA: Diagnosis not present

## 2019-08-02 DIAGNOSIS — L89152 Pressure ulcer of sacral region, stage 2: Secondary | ICD-10-CM | POA: Diagnosis not present

## 2019-08-02 DIAGNOSIS — T827XXS Infection and inflammatory reaction due to other cardiac and vascular devices, implants and grafts, sequela: Secondary | ICD-10-CM | POA: Diagnosis present

## 2019-08-02 DIAGNOSIS — R7881 Bacteremia: Secondary | ICD-10-CM

## 2019-08-02 LAB — CBC AND DIFFERENTIAL
HCT: 29 — AB (ref 36–46)
Hemoglobin: 9.5 — AB (ref 12.0–16.0)
Neutrophils Absolute: 6
Platelets: 314 (ref 150–399)
WBC: 9.1

## 2019-08-02 LAB — COMPREHENSIVE METABOLIC PANEL
Calcium: 9.1 (ref 8.7–10.7)
GFR calc Af Amer: 32.54
GFR calc non Af Amer: 28.08

## 2019-08-02 LAB — BASIC METABOLIC PANEL
BUN: 47 — AB (ref 4–21)
CO2: 26 — AB (ref 13–22)
Chloride: 103 (ref 99–108)
Creatinine: 1.7 — AB (ref 0.5–1.1)
Glucose: 138
Potassium: 4.3 (ref 3.4–5.3)
Sodium: 139 (ref 137–147)

## 2019-08-02 LAB — CBC: RBC: 3.14 — AB (ref 3.87–5.11)

## 2019-08-02 NOTE — Progress Notes (Signed)
RFV: follow up for group b streptococcal cardiac device infection/endocarditis  Patient ID: [name], female   DOB: Apr 18, 1945, 74 y.o.   MRN: [id number]  HPI Deshawn is a 74yo F with CAD s/p CABG, subsequent TAVR, and also mobitz 2 heartblock s/p PPM in Dec 2020 subsequently admitted in May 2021 for group b strep bacteremia, where she was found to have vegetation on pacemaker lead. She was transferred to [facility] for pacemaker extraction then subsequently discharged on 4 wk of ceftriaxone. Based on review of paperwork from [facility], she sustained some aki during period of getting vancomycin. Appears improved. Patient otherwise tolerating abtx infusion  Outpatient Encounter Medications as of 08/02/2019  Medication Sig  . acetaminophen (TYLENOL) 325 MG tablet Take 650 mg by mouth every 6 (six) hours as needed.  . Amino Acids-Protein Hydrolys (FEEDING SUPPLEMENT, PRO-STAT SUGAR FREE 64,) LIQD Take 30 mLs by mouth 2 (two) times daily with a meal.  . amLODipine (NORVASC) 5 MG tablet Take 1 tablet (5 mg total) by mouth daily.  Marland Kitchen amoxicillin (AMOXIL) 500 MG capsule Take 2,000 mg by mouth as needed. Prior to dental procedure  . aspirin 81 MG EC tablet TAKE 1 TABLET (81 MG TOTAL) BY MOUTH DAILY. SWALLOW WHOLE.  Marland Kitchen atorvastatin (LIPITOR) 80 MG tablet Take 1 tablet (80 mg total) by mouth at bedtime.  . cefTRIAXone 2 g in sodium chloride 0.9 % 100 mL Inject 2 g into the vein daily. Piggyback for endocarditis  . clopidogrel (PLAVIX) 75 MG tablet Take 1 tablet (75 mg total) by mouth daily with breakfast.  . diclofenac Sodium (VOLTAREN) 1 % GEL Apply 2 g topically 4 (four) times daily.  Marland Kitchen EASY TOUCH PEN NEEDLES 31G X 5 MM MISC USE TO INJECT INSULIN INTO THE SKIN  . furosemide (LASIX) 40 MG tablet Take 1 tablet (40 mg total) by mouth 2 (two) times daily.  . hydrALAZINE (APRESOLINE) 50 MG tablet Take 1 tablet (50 mg total) by mouth 3 (three) times daily.  . Insulin Degludec-Liraglutide (XULTOPHY) 100-3.6  UNIT-MG/ML SOPN Inject 14 Units into the skin daily.  . metoprolol succinate (TOPROL-XL) 25 MG 24 hr tablet Take 1 tablet (25 mg total) by mouth daily.  Marland Kitchen PARoxetine (PAXIL) 30 MG tablet TAKE 1 TABLET BY MOUTH DAILY  . potassium chloride (KLOR-CON) 10 MEQ tablet Take 10 mEq by mouth daily.   No facility-administered encounter medications on file as of 08/02/2019.     Patient Active Problem List   Diagnosis Date Noted  . Infection of pacemaker lead wire (HCC) 07/21/2019  . Fever   . History of transcatheter aortic valve replacement (TAVR)   . Septic encephalopathy 07/18/2019  . Sacral decubitus ulcer, stage II (HCC) 07/18/2019  . Bacteremia due to group B Streptococcus 07/18/2019  . Sepsis (HCC) 07/18/2019  . Complete heart block (HCC)   . Chronic diastolic congestive heart failure (HCC) 02/07/2019  . AKI (acute kidney injury) (HCC) 07/22/2017  . Peripheral arterial disease (HCC) 06/18/2017  . B12 deficiency 05/01/2017  . Bilateral lower extremity edema 11/08/2015  . Aortic atherosclerosis (HCC) 06/07/2014  . Abnormality of gait 05/29/2012  . Constipation 03/16/2012  . HOCM (hypertrophic obstructive cardiomyopathy) (HCC) 06/11/2011  . Routine health maintenance 06/10/2010  . Hyperlipidemia   . Refusal of blood transfusions as patient is Jehovah's Witness   . History of adenocarcinoma of breast   . Osteoporosis   . Status post CVA   . Aortic stenosis s/p Tissure AVR 2006   . CAD (coronary  artery disease)   . Hypertension   . Depression 01/23/2006  . DM (diabetes mellitus) ([place]) 03/25/1990  . Benign hypertensive heart and kidney disease with diastolic CHF, NYHA class 3 and CKD stage 3 ([place]) 1992  . Bradycardia 1992     Health Maintenance Due  Topic Date Due  . Hepatitis C Screening  Never done  . COVID-19 Vaccine (1) Never done  . OPHTHALMOLOGY EXAM  11/07/2016  . FOOT EXAM  07/10/2017     Review of Systems 12 point ros is negative per hpi Physical Exam   BP 114/64    Pulse 65   Temp 98.4 F (36.9 C)   SpO2 99%   Physical Exam  Constitutional:  oriented to person, place, and time. appears well-developed and well-nourished. No distress.  HENT: [place]/AT, PERRLA, no scleral icterus Mouth/Throat: Oropharynx is clear and moist. No oropharyngeal exudate.  Cardiovascular: Normal rate, regular rhythm and normal heart sounds. Exam reveals no gallop and no friction rub.  No murmur heard.  Pulmonary/Chest: Effort normal and breath sounds normal. No respiratory distress.  has no wheezes.  Neck = supple, no nuchal rigidity Chest wall = bandaged/steri strip no surrounding erythema or tenderness or fluctuance Lymphadenopathy: no cervical adenopathy. No axillary adenopathy Neurological: alert and oriented to person, place, and time.  Skin: Skin is warm and dry. No rash noted. No erythema.  Psychiatric: a normal mood and affect.  behavior is normal.   CBC Lab Results  Component Value Date   WBC 9.2 07/20/2019   RBC 3.45 (L) 07/20/2019   HGB 10.3 (L) 07/20/2019   HCT 32.4 (L) 07/20/2019   PLT 193 07/20/2019   MCV 93.9 07/20/2019   MCH 29.9 07/20/2019   MCHC 31.8 07/20/2019   RDW 13.7 07/20/2019   LYMPHSABS 0.6 (L) 07/17/2019   MONOABS 0.8 07/17/2019   EOSABS 0.1 07/17/2019    BMET Lab Results  Component Value Date   NA 139 07/21/2019   K 3.6 07/21/2019   CL 102 07/21/2019   CO2 29 07/21/2019   GLUCOSE 209 (H) 07/21/2019   BUN 20 07/21/2019   CREATININE 1.35 (H) 07/21/2019   CALCIUM 8.5 (L) 07/21/2019   GFRNONAA 38 (L) 07/21/2019   GFRAA 44 (L) 07/21/2019      Assessment and Plan  Cardiac device infection =Ceftriaxone will be given through July 8th as scheduled to complete 4 wks Do weekly cbc and bmp; will need for follow up with dr Curt Bears mid July to assess need for PPM reimplantation since she has finished her treatment course.  Continue with wound care to stage 2 pressure wound on buttocks  Will recommend to Off load left leg since left  leg weakness from previous stroke, at risk of developing pressure ulcer from how it is laying near foot rest on wheelchair

## 2019-08-04 ENCOUNTER — Ambulatory Visit: Payer: Medicare Other | Admitting: Physician Assistant

## 2019-08-04 ENCOUNTER — Encounter: Payer: Self-pay | Admitting: Internal Medicine

## 2019-08-04 ENCOUNTER — Other Ambulatory Visit: Payer: Medicare Other

## 2019-08-04 DIAGNOSIS — R29818 Other symptoms and signs involving the nervous system: Secondary | ICD-10-CM | POA: Insufficient documentation

## 2019-08-04 DIAGNOSIS — R4189 Other symptoms and signs involving cognitive functions and awareness: Secondary | ICD-10-CM | POA: Insufficient documentation

## 2019-08-08 ENCOUNTER — Encounter: Payer: Self-pay | Admitting: Adult Health

## 2019-08-08 ENCOUNTER — Non-Acute Institutional Stay (SKILLED_NURSING_FACILITY): Payer: Medicare Other | Admitting: Adult Health

## 2019-08-08 DIAGNOSIS — I10 Essential (primary) hypertension: Secondary | ICD-10-CM | POA: Diagnosis not present

## 2019-08-08 DIAGNOSIS — E1121 Type 2 diabetes mellitus with diabetic nephropathy: Secondary | ICD-10-CM | POA: Diagnosis not present

## 2019-08-08 DIAGNOSIS — I2510 Atherosclerotic heart disease of native coronary artery without angina pectoris: Secondary | ICD-10-CM | POA: Diagnosis not present

## 2019-08-08 DIAGNOSIS — T827XXD Infection and inflammatory reaction due to other cardiac and vascular devices, implants and grafts, subsequent encounter: Secondary | ICD-10-CM | POA: Diagnosis not present

## 2019-08-08 DIAGNOSIS — Z794 Long term (current) use of insulin: Secondary | ICD-10-CM

## 2019-08-08 DIAGNOSIS — N179 Acute kidney failure, unspecified: Secondary | ICD-10-CM | POA: Diagnosis not present

## 2019-08-08 DIAGNOSIS — E1122 Type 2 diabetes mellitus with diabetic chronic kidney disease: Secondary | ICD-10-CM

## 2019-08-08 DIAGNOSIS — N1832 Chronic kidney disease, stage 3b: Secondary | ICD-10-CM

## 2019-08-08 LAB — COMPREHENSIVE METABOLIC PANEL
Calcium: 9.4 (ref 8.7–10.7)
GFR calc Af Amer: 38.01
GFR calc non Af Amer: 32.79

## 2019-08-08 LAB — CBC AND DIFFERENTIAL
HCT: 33 — AB (ref 36–46)
Hemoglobin: 10.9 — AB (ref 12.0–16.0)
Neutrophils Absolute: 5
Platelets: 299 (ref 150–399)
WBC: 7.7

## 2019-08-08 LAB — BASIC METABOLIC PANEL
BUN: 41 — AB (ref 4–21)
CO2: 26 — AB (ref 13–22)
Chloride: 101 (ref 99–108)
Creatinine: 1.5 — AB (ref 0.5–1.1)
Glucose: 124
Potassium: 4.1 (ref 3.4–5.3)
Sodium: 139 (ref 137–147)

## 2019-08-08 LAB — CBC: RBC: 3.62 — AB (ref 3.87–5.11)

## 2019-08-08 NOTE — Progress Notes (Addendum)
Location:  [place] Room Number: 563-S Place of Service:  SNF (31) Provider:  Durenda Age, DNP, FNP-BC  Patient Care Team: [name], MD as PCP - General (Internal Medicine) Dorothy Spark, MD as PCP - Cardiology (Cardiology) Constance Haw, MD as PCP - Electrophysiology (Cardiology) Chrismon, Amber as Social Worker Barrington Ellison, RN as Case Manager  Extended Emergency Contact Information Primary Emergency Contact: Lieutenant [place] of [place] Phone: [phone] Mobile Phone: [phone] Relation: Daughter Secondary Emergency Contact: Armstrong,[name]  [place] Phone: [phone] Relation: Daughter  Code Status:  Full Code  Goals of care: Advanced Directive information Advanced Directives 07/18/2019  Does Patient Have a Medical Advance Directive? No  Type of Advance Directive -  Does patient want to make changes to medical advance directive? -  Copy of [place] in Chart? -  Would patient like information on creating a medical advance directive? No - Guardian declined  Pre-existing out of facility DNR order (yellow form or pink MOST form) -     Chief Complaint  Patient presents with  . Medical Management of Chronic Issues    Routine short-term rehabilitation visit.     HPI:  Pt is a 74 y.o. female seen today for medical management of chronic diseases.  She is a short-term care resident of [facility].  She has a PMH of stroke, osteoporosis, diverticulosis, IDDM with vascular complications, chronic kidney disease, CAD, severe aortic stenosis, history of breast cancer and history of chronic diastolic CHF.  She was admitted to [place] on 07/28/19 post [facility] hospitalization 07/22/19 to 07/28/2019 for bacteremia with pacemaker lead vegetation.  Initially, she  presented to [facility] ED on 5/23 with  evaluation of sepsis and bilateral pulmonary infiltrates on chest x-ray. She was then transferred to [facility] from [facility] on 07/22/2019 after TEE from OSH revealed vegetation on her device leads.  Records revealed that she had positive blood cultures on 07/17/2019 for group B strep and received 1 dose of penicillin G prior to transfer.  She is a Sales promotion account executive Witness and will not be accepting any blood products.  On 5/28 she was seen by infectious disease and recommended vancomycin while awaiting new blood cultures.  She had a successful device and lead explant with micra leadless pacemaker implant.  Her creatinine went up to 1.6 on 5/29.  Infectious disease saw her daily and her antibiotic therapy was changed to penicillin G with vancomycin being discontinued.  On 5/30 AKI was noted with Creatinine 2.1 (from 1.3 on admission) and was unable to void without in-out catheterization.  Patient's creatinine improved with diuretic holiday, creatinine down to 1.67.  She had urinary retention on bladder scan requiring straight catheterizations.  Along this same time, she was complaining of constipation x5 days.  After this was addressed, she was able to void on her own.  Single-lumen PICC was placed on 07/28/2019, on her right arm.  ID recommendation, final antibiotic regimen at discharge for ceftriaxone 2 g every 24 hour, start date 5/28 with end date 09/01/19.  GMT was consulted due to uncontrolled blood glucose with hemoglobin A1c 9.0.  It was recommended for her Xulthopy to be increased to 14 units daily.   Past Medical History:  Diagnosis Date  . Acute on chronic diastolic CHF (congestive heart failure) ([place]) 06/22/2017  . Acute on chronic renal failure ([place]) 05/25/2011  . Adenocarcinoma of breast ([place])  1996   Completed tamoxifen and had mastectomy.  . Aortic stenosis, severe    s/p aortic valve replacement with porcine valve 06/2004.  ECHO 2010 EF 45%, LVH, diastolic dysfxn, Bioprostetic aoritc valve, mild  AS. ECHO 2013 EF 60%, Nl aortic artificial valve, dynamic obstruction in the outflow tract   Class IIb rec for annual TTE after 5 yrs. She had a TTE 2013    . Arthritis   . CAD (coronary artery disease) 2006   s/p CABG (5/06) w/ saphenous vein to RCA at time of AVR  . CKD (chronic kidney disease) stage 2, GFR 60-89 ml/min 06/03/2011   She is no longer taking NSAIDs.   Marland Kitchen CVA (cerebral infarction) 2006   Post-op from AVR. Presumed embolic in nature. Carotid stenosis of R 60-79%. Repeat dopplers 4/10 no R stenosis and L stenosis of 1-29%.  . Depression    Controlled on Paxil  . Diabetes mellitus 1992   Dx 04/25/1990. Now insulin dependent, started 2008. On ACEI.   . Diverticulosis 2001  . History of 2019 novel coronavirus disease (COVID-19)   . Hyperlipidemia    Mgmt with a statin  . Hypertension    Requires 4 drug tx  . Osteoporosis 2006   DEXA 10/06 : L femur T -2.8, R -2.7. Lumbar T -2.4. On bisphosphonates and  Calcium / Vit D.  . Presence of permanent cardiac pacemaker   . Refusal of blood transfusions as patient is Jehovah's Witness   . Stroke [facility])    Past Surgical History:  Procedure Laterality Date  . ABDOMINAL HYSTERECTOMY  1987   for fibroids  . AORTIC VALVE REPLACEMENT  2006  . CHOLECYSTECTOMY    . CORONARY ARTERY BYPASS GRAFT  2006   Saphenous vein to RCA at time of AVR. Course complicated by acute respiratory failure, post-op PTX, ARI, ileus, CVA  . MASTECTOMY Left 1995   L for adenocarcinoma  . PACEMAKER IMPLANT N/A 02/09/2019   Procedure: PACEMAKER IMPLANT;  Surgeon: Constance Haw, MD;  Location: [place] CV LAB;  Service: Cardiovascular;  Laterality: N/A;  . RIGHT HEART CATH AND CORONARY/GRAFT ANGIOGRAPHY N/A 02/09/2019   Procedure: RIGHT HEART CATH AND CORONARY/GRAFT ANGIOGRAPHY;  Surgeon: Sherren Mocha, MD;  Location: [place] CV LAB;  Service: Cardiovascular;  Laterality: N/A;  . TEE WITHOUT CARDIOVERSION N/A 07/20/2019   Procedure: TRANSESOPHAGEAL  ECHOCARDIOGRAM (TEE);  Surgeon: Jerline Pain, MD;  Location: [facility] ENDOSCOPY;  Service: Cardiovascular;  Laterality: N/A;  . TRANSCATHETER AORTIC VALVE REPLACEMENT, TRANSFEMORAL N/A 03/01/2019   Procedure: TRANSCATHETER AORTIC VALVE REPLACEMENT, TRANSFEMORAL;  Surgeon: Sherren Mocha, MD;  Location: [place] CV LAB;  Service: Open Heart Surgery;  Laterality: N/A;    Allergies  Allergen Reactions  . 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," as the patient is a Jehovah's Witness    Outpatient Encounter Medications as of 08/08/2019  Medication Sig  . acetaminophen (TYLENOL) 325 MG tablet Take 650 mg by mouth every 6 (six) hours as needed.  . Amino Acids-Protein Hydrolys (FEEDING SUPPLEMENT, PRO-STAT SUGAR FREE 64,) LIQD Take 30 mLs by mouth 2 (two) times daily with a meal.  . amLODipine (NORVASC) 5 MG tablet Take 1 tablet (5 mg total) by mouth daily.  Marland Kitchen amoxicillin (AMOXIL) 500 MG capsule Take 2,000 mg by mouth as needed. Prior to dental procedure  . aspirin 81 MG EC tablet TAKE 1 TABLET (81 MG TOTAL) BY MOUTH DAILY. SWALLOW WHOLE.  Marland Kitchen atorvastatin (LIPITOR) 80 MG tablet Take 1  tablet (80 mg total) by mouth at bedtime.  . cefTRIAXone 2 g in sodium chloride 0.9 % 100 mL Inject 2 g into the vein daily. Piggyback for endocarditis  . clopidogrel (PLAVIX) 75 MG tablet Take 1 tablet (75 mg total) by mouth daily with breakfast.  . diclofenac Sodium (VOLTAREN) 1 % GEL Apply 2 g topically 4 (four) times daily.  . furosemide (LASIX) 40 MG tablet Take 1 tablet (40 mg total) by mouth 2 (two) times daily.  . hydrALAZINE (APRESOLINE) 50 MG tablet Take 1 tablet (50 mg total) by mouth 3 (three) times daily.  . Insulin Degludec-Liraglutide (XULTOPHY) 100-3.6 UNIT-MG/ML SOPN Inject 14 Units into the skin daily.  . metoprolol succinate (TOPROL-XL) 25 MG 24 hr tablet Take 1 tablet (25 mg total) by mouth daily.  Marland Kitchen PARoxetine (PAXIL) 30 MG tablet TAKE 1 TABLET BY MOUTH DAILY  . potassium chloride  (KLOR-CON) 10 MEQ tablet Take 10 mEq by mouth daily.  Marland Kitchen EASY TOUCH PEN NEEDLES 31G X 5 MM MISC USE TO INJECT INSULIN INTO THE SKIN (Patient not taking: Reported on 08/08/2019)   No facility-administered encounter medications on file as of 08/08/2019.    Review of Systems  GENERAL: No change in appetite, no fatigue, no weight changes, no fever, chills or weakness MOUTH and THROAT: Denies oral discomfort, gingival pain or bleeding 20 RESPIRATORY: no cough, SOB, DOE, wheezing, hemoptysis CARDIAC: No chest pain, edema or palpitations GI: No abdominal pain, diarrhea, constipation, heart burn, nausea or vomiting GU: Denies dysuria, frequency, hematuria, incontinence, or discharge NEUROLOGICAL: Denies dizziness, syncope, numbness, or headache PSYCHIATRIC: Denies feelings of depression or anxiety. No report of hallucinations, insomnia, paranoia, or agitation   Immunization History  Administered Date(s) Administered  . Fluad Quad(high Dose 65+) 02/11/2019  . Influenza Split 11/12/2010, 11/10/2011  . Influenza Whole 11/07/2009  . Influenza,inj,Quad PF,6+ Mos 10/19/2012, 01/05/2014, 11/08/2015, 01/29/2017  . Pneumococcal Conjugate-13 06/07/2014  . Pneumococcal Polysaccharide-23 12/26/1994, 03/05/2010  . Td 09/24/1993  . Tdap 11/12/2010, 07/12/2013   Pertinent  Health Maintenance Due  Topic Date Due  . OPHTHALMOLOGY EXAM  11/07/2016  . FOOT EXAM  07/10/2017  . HEMOGLOBIN A1C  08/19/2019  . INFLUENZA VACCINE  09/25/2019  . LIPID PANEL  02/07/2020  . DEXA SCAN  Completed  . PNA vac Low Risk Adult  Completed   Fall Risk  08/02/2019 06/16/2019 05/19/2019 05/04/2019 04/27/2019  Falls in the past year? 1 1 1  0 0  Comment - pt denies-but dtr stated pt had a fall in last mths pt denies falls-but dtr states pt has had a fall - -  Number falls in past yr: 1 0 0 - 0  Injury with Fall? 0 0 0 - 0  Risk Factor Category  - - - - -  Risk for fall due to : Impaired mobility History of fall(s);Impaired  balance/gait;Impaired mobility;Medication side effect History of fall(s);Impaired balance/gait;Impaired mobility;Medication side effect No Fall Risks -  Risk for fall due to: Comment - - - - -  Follow up Falls evaluation completed Falls evaluation completed;Falls prevention discussed Falls evaluation completed Falls evaluation completed -     Vitals:   08/08/19 1610  BP: 132/72  Pulse: 72  Resp: 20  Temp: 97.9 F (36.6 C)  TempSrc: Oral  SpO2: 99%  Weight: 165 lb 6.4 oz (75 kg)  Height: 5\' 6"  (1.676 m)   Body mass index is 26.7 kg/m.  Physical Exam  GENERAL APPEARANCE: Well nourished. In no acute distress. Normal body habitus  SKIN:  Right chest with dressing. Pink scarring to right inner buttock. MOUTH and THROAT: Lips are without lesions. Oral mucosa is moist and without lesions.  RESPIRATORY: Breathing is even & unlabored, BS CTAB CARDIAC: RRR, no murmur,no extra heart sounds, no edema GI: Abdomen soft, normal BS, no masses, no tenderness EXTREMITIES:  Able to move X 4 extremities NEUROLOGICAL: There is no tremor. Speech is clear. Alert and oriented X 3. PSYCHIATRIC:  Affect and behavior are appropriate  Labs reviewed: Recent Labs    02/07/19 1323 02/07/19 1527 02/08/19 0556 02/09/19 0508 03/02/19 0253 05/04/19 1144 07/19/19 0219 07/19/19 0219 07/20/19 0301 07/21/19 0356 08/02/19 0000  NA 143   < > 145   < > 140   < > 137   < > 137 139 139  K 3.3*   < > 4.1   < > 3.7   < > 3.7   < > 3.6 3.6 4.3  CL 110   < > 109   < > 105   < > 99   < > 98 102 103  CO2 23   < > 27   < > 26   < > 27   < > 27 29 26*  GLUCOSE 203*   < > 143*   < > 115*   < > 140*  --  239* 209*  --   BUN 25*   < > 26*   < > 24*   < > 27*   < > 26* 20 47*  CREATININE 1.48*   < > 1.58*   < > 1.47*   < > 1.55*   < > 1.56* 1.35* 1.7*  CALCIUM 8.2*   < > 9.0   < > 8.8*   < > 8.4*   < > 8.6* 8.5* 9.1  MG 1.7   < > 2.3  --  1.7  --   --   --  1.8  --   --   PHOS 3.6  --   --   --   --   --   --   --    --   --   --    < > = values in this interval not displayed.   Recent Labs    02/07/19 1323 02/24/19 1020 07/17/19 2150  AST 13* 16 17  ALT $Re'12 13 13  'jvX$ ALKPHOS 96 125 141*  BILITOT 1.0 0.6 0.8  PROT 6.5 7.8 7.6  ALBUMIN 3.1* 3.4* 3.4*   Recent Labs    02/07/19 1323 02/07/19 1527 07/17/19 2150 07/17/19 2150 07/19/19 0219 07/20/19 0723 08/02/19 0000  WBC 11.7*   < > 18.9*   < > 16.1* 9.2 9.1  NEUTROABS 9.2*  --  17.3*  --   --   --  6  HGB 11.0*   < > 12.8   < > 10.3* 10.3* 9.5*  HCT 34.4*   < > 40.0   < > 32.2* 32.4* 29*  MCV 95.6   < > 94.8  --  93.9 93.9  --   PLT 207   < > 256   < > 184 193 314   < > = values in this interval not displayed.   Lab Results  Component Value Date   TSH 1.630 02/07/2019   Lab Results  Component Value Date   HGBA1C 8.6 (A) 05/19/2019   Lab Results  Component Value Date   CHOL 99 02/07/2019   HDL 26 (L) 02/07/2019  LDLCALC 50 02/07/2019   TRIG 117 02/07/2019   CHOLHDL 3.8 02/07/2019    Significant Diagnostic Results in last 30 days:  EEG  Result Date: 07/18/2019 Lora Havens, MD     07/18/2019 11:02 AM Patient Name: TAYANNA TALFORD MRN: [id number] Epilepsy Attending: Lora Havens Referring Physician/Provider: Dr. Kathi Ludwig Date: 07/18/2019 Duration: 24.58 minutes Patient history: 74 year old female with altered mental status.  EEG evaluate for seizures. Level of alertness: Awake, asleep AEDs during EEG study: None Technical aspects: This EEG study was done with scalp electrodes positioned according to the 10-20 International system of electrode placement. Electrical activity was acquired at a sampling rate of $Remov'500Hz'Gmwufo$  and reviewed with a high frequency filter of $RemoveB'70Hz'kuOgTLst$  and a low frequency filter of $RemoveB'1Hz'fgphILhd$ . EEG data were recorded continuously and digitally stored. Description: The posterior dominant rhythm consists of 8-9 Hz activity of moderate voltage (25-35 uV) seen predominantly in posterior head regions, symmetric and  reactive to eye opening and eye closing. Sleep was characterized by vertex waves, sleep spindles (12 to 14 Hz), maximal frontocentral region.  EEG showed intermittent generalized 3 to 6 Hz theta-delta slowing  Physiology photic driving was not seen during photic stimulation.  Hyperventilation was not performed.   ABNORMALITY -Intermittent slow, generalized IMPRESSION: This study is suggestive of mild diffuse encephalopathy, nonspecific etiology. No seizures or epileptiform discharges were seen throughout the recording. Lora Havens   DG Chest 2 View  Result Date: 07/17/2019 CLINICAL DATA:  Altered mental status and weakness EXAM: CHEST - 2 VIEW COMPARISON:  Radiograph 06/24/2019 FINDINGS: Pacer pack overlies the right chest wall with leads at the cardiac apex and right atrium in similar positioning to prior. And aortic valve stent graft is also in unchanged location from the comparison exam. Postsurgical changes related to prior CABG including intact and aligned sternotomy wires and multiple surgical clips projecting over the mediastinum. There are diffuse widespread interstitial and patchy developing airspace opacities throughout both lungs with indistinct, cephalized vascularity. No visible pneumothorax or effusion. Cardiac silhouette is mildly enlarged with dense calcific aortic atherosclerosis. No acute osseous or soft tissue abnormality. Degenerative changes are present in the imaged spine and shoulders. Telemetry leads overlie the chest. IMPRESSION: 1. Diffuse widespread interstitial and patchy developing airspace opacities throughout both lungs, favor edema/heart failure with cardiomegaly though infection could present similarly. 2.  Aortic Atherosclerosis (ICD10-I70.0). 3. Prior CABG and TAVR. Electronically Signed   By: Lovena Le M.D.   On: 07/17/2019 22:52   CT Head Wo Contrast  Result Date: 07/17/2019 CLINICAL DATA:  Altered mental status. EXAM: CT HEAD WITHOUT CONTRAST TECHNIQUE:  Contiguous axial images were obtained from the base of the skull through the vertex without intravenous contrast. COMPARISON:  Jul 10, 2018 FINDINGS: Brain: There is mild cerebral atrophy with widening of the extra-axial spaces and ventricular dilatation. There are areas of decreased attenuation within the white matter tracts of the supratentorial brain, consistent with microvascular disease changes. Vascular: No hyperdense vessel or unexpected calcification. Skull: Normal. Negative for fracture or focal lesion. Sinuses/Orbits: No acute finding. Other: None. IMPRESSION: 1. Mild generalized cerebral atrophy. 2. No acute intracranial abnormality. Electronically Signed   By: Virgina Norfolk M.D.   On: 07/17/2019 22:51   DG Chest Port 1 View  Result Date: 07/21/2019 CLINICAL DATA:  Central line placement EXAM: PORTABLE CHEST 1 VIEW COMPARISON:  07/17/2019, 06/24/2019 FINDINGS: Post sternotomy changes. Right-sided pacing device with similar appearance of pacing leads. Status post TAVR. Possible right ascending catheter tip projecting  over the low right atrium. Otherwise no additional central venous catheter tubing is visible on the current image. Stable enlarged cardiomediastinal silhouette with aortic atherosclerosis. Improved aeration with decreased vascular congestion and edema. No pneumothorax. IMPRESSION: 1. Possible right ascending catheter tip projecting over the low right atrium/cavoatrial junction, otherwise no central venous catheter tubing is visible on the current image. 2. Improved aeration with decreased vascular congestion and edema. Stable cardiomegaly. Electronically Signed   By: Donavan Foil M.D.   On: 07/21/2019 15:51   ECHOCARDIOGRAM COMPLETE  Result Date: 07/18/2019    ECHOCARDIOGRAM REPORT   Patient Name:   TRISTON LISANTI Date of Exam: 07/18/2019 Medical Rec #:  [id number]        Height:       66.0 in Accession #:    [id number]       Weight:       169.0 lb Date of Birth:  08/14/1945         BSA:          1.862 m Patient Age:    11 years         BP:           147/57 mmHg Patient Gender: F                HR:           70 bpm. Exam Location:  Inpatient Procedure: 2D Echo, Color Doppler and Cardiac Doppler Indications:    Bacteremia R78.81  History:        Patient has prior history of Echocardiogram examinations, most                 recent 04/24/2019. CAD; Risk Factors:Hypertension, Diabetes and                 Dyslipidemia. COVID+ on 07/17/18                 May 2006 Aortic Valve replacement with porcine bioprosthetic.                 03/01/19 valve-in-valve TAVR with 74mm Medtronic Evolut Proplus.  Sonographer:    Raquel Sarna Senior RDCS Referring Phys: [place]  1. There is severe LVH with a small LVOT gradient present . Left ventricular ejection fraction, by estimation, is 60 to 65%. The left ventricle has normal function. The left ventricle has no regional wall motion abnormalities. There is severe concentric  left ventricular hypertrophy. Left ventricular diastolic parameters are consistent with Grade II diastolic dysfunction (pseudonormalization).  2. Right ventricular systolic function is mildly reduced. The right ventricular size is normal. There is normal pulmonary artery systolic pressure.  3. Left atrial size was severely dilated.  4. Right atrial size was mildly dilated.  5. The mitral valve is abnormal. No evidence of mitral valve regurgitation. Moderate to severe mitral stenosis.  6. The aortic valve has been repaired/replaced. Aortic valve regurgitation is trivial. FINDINGS  Left Ventricle: There is severe LVH with a small LVOT gradient present. Left ventricular ejection fraction, by estimation, is 60 to 65%. The left ventricle has normal function. The left ventricle has no regional wall motion abnormalities. The left ventricular internal cavity size was small. There is severe concentric left ventricular hypertrophy. Left ventricular diastolic parameters are consistent with  Grade II diastolic dysfunction (pseudonormalization). Right Ventricle: The right ventricular size is normal. No increase in right ventricular wall thickness. Right ventricular systolic function is mildly reduced. There is normal pulmonary artery  systolic pressure. The tricuspid regurgitant velocity is 2.73 m/s, and with an assumed right atrial pressure of 3 mmHg, the estimated right ventricular systolic pressure is 32.9 mmHg. Left Atrium: Left atrial size was severely dilated. Right Atrium: Right atrial size was mildly dilated. Pericardium: There is no evidence of pericardial effusion. Mitral Valve: The mitral valve is abnormal. Severe mitral annular calcification. No evidence of mitral valve regurgitation. Moderate to severe mitral valve stenosis. MV peak gradient, 15.5 mmHg. The mean mitral valve gradient is 9.7 mmHg. Tricuspid Valve: The tricuspid valve is grossly normal. Tricuspid valve regurgitation is mild. Aortic Valve: The aortic valve has been repaired/replaced. Aortic valve regurgitation is trivial. Severe aortic valve annular calcification. Aortic valve mean gradient measures 15.0 mmHg. Aortic valve peak gradient measures 27.0 mmHg. Aortic valve area, by VTI measures 1.51 cm. There is a Medtronic, stented (TAVR) valve present in the aortic position. Pulmonic Valve: The pulmonic valve was normal in structure. Pulmonic valve regurgitation is trivial. Aorta: The aortic root and ascending aorta are structurally normal, with no evidence of dilitation. IAS/Shunts: The atrial septum is grossly normal. Additional Comments: A pacer wire is visualized.  LEFT VENTRICLE PLAX 2D LVIDd:         3.00 cm  Diastology LVIDs:         2.20 cm  LV e' lateral:   4.35 cm/s LV PW:         1.80 cm  LV E/e' lateral: 40.9 LV IVS:        1.60 cm  LV e' medial:    4.35 cm/s LVOT diam:     1.80 cm  LV E/e' medial:  40.9 LV SV:         80 LV SV Index:   43 LVOT Area:     2.54 cm  RIGHT VENTRICLE RV S prime:     8.81 cm/s TAPSE  (M-mode): 1.6 cm LEFT ATRIUM              Index       RIGHT ATRIUM           Index LA diam:        4.40 cm  2.36 cm/m  RA Area:     19.70 cm LA Vol (A2C):   83.8 ml  45.01 ml/m RA Volume:   56.50 ml  30.35 ml/m LA Vol (A4C):   105.0 ml 56.40 ml/m LA Biplane Vol: 94.4 ml  50.71 ml/m  AORTIC VALVE AV Area (Vmax):    1.36 cm AV Area (Vmean):   1.38 cm AV Area (VTI):     1.51 cm AV Vmax:           260.00 cm/s AV Vmean:          186.000 cm/s AV VTI:            0.534 m AV Peak Grad:      27.0 mmHg AV Mean Grad:      15.0 mmHg LVOT Vmax:         139.00 cm/s LVOT Vmean:        101.000 cm/s LVOT VTI:          0.316 m LVOT/AV VTI ratio: 0.59  AORTA Ao Root diam: 2.70 cm MITRAL VALVE                TRICUSPID VALVE MV Area (PHT): 1.84 cm     TR Peak grad:   29.8 mmHg MV Peak grad:  15.5 mmHg  TR Vmax:        273.00 cm/s MV Mean grad:  9.7 mmHg MV Vmax:       1.97 m/s     SHUNTS MV Vmean:      149.0 cm/s   Systemic VTI:  0.32 m MV Decel Time: 412 msec     Systemic Diam: 1.80 cm MV E velocity: 178.00 cm/s MV A velocity: 174.00 cm/s MV E/A ratio:  1.02 Kristeen Miss MD Electronically signed by Kristeen Miss MD Signature Date/Time: 07/18/2019/2:57:27 PM    Final    ECHO TEE  Result Date: 07/20/2019    TRANSESOPHOGEAL ECHO REPORT   Patient Name:   MIKAYLEE ARSENEAU Date of Exam: 07/20/2019 Medical Rec #:  [id number]        Height:       66.0 in Accession #:    [id number]       Weight:       169.0 lb Date of Birth:  01/06/1946        BSA:          1.862 m Patient Age:    73 years         BP:           175/72 mmHg Patient Gender: F                HR:           73 bpm. Exam Location:  Inpatient Procedure: Transesophageal Echo, Color Doppler and Cardiac Doppler Indications:     Endocarditis  History:         Patient has prior history of Echocardiogram examinations, most                  recent 07/18/2019. CAD (coronary artery disease), 03/01/19                  valve-in-valve TAVR with, Stroke; Risk Factors:Dyslipidemia and                   Hypertension. Bacteremia due to group B Streptococcus.                  Aortic Valve: 23 mm Medtronic, stented (TAVR) valve is present                  in the aortic position.  Sonographer:     Leeroy Bock Turrentine Referring Phys:  [id number] Manson Passey Diagnosing Phys: Donato Schultz MD PROCEDURE: The transesophogeal probe was passed without difficulty through the esophogus of the patient. Sedation performed by different physician. The patient was monitored while under deep sedation. Anesthestetic sedation was provided intravenously by Anesthesiology: 279mg  of Propofol. The patient's vital signs; including heart rate, blood pressure, and oxygen saturation; remained stable throughout the procedure. The patient developed no complications during the procedure. IMPRESSIONS  1. There are 2 moderate sized linear mobile echodensities attached to pacemaker lead consistent with vegetation.  2. Left ventricular ejection fraction, by estimation, is 60 to 65%. The left ventricle has normal function. The left ventricle has no regional wall motion abnormalities. There is moderate left ventricular hypertrophy.  3. Right ventricular systolic function is normal. The right ventricular size is normal.  4. No left atrial/left atrial appendage thrombus was detected.  5. The mitral valve is degenerative. No evidence of mitral valve regurgitation. Moderate mitral stenosis. The mean mitral valve gradient is 8.0 mmHg.  6. Medtronic Evolut Pro Plus 23 mm transcatheter heart valve (VALVE IN VALVE).  There is trivial to mild perivalvular leakage in 12-1 o'clock position on TEE (posterior). Seen on limited transthoracic ECHO post op. No rocking motion. . The aortic valve has been repaired/replaced. Aortic valve regurgitation is not visualized. No aortic stenosis is present. There is a 23 mm Medtronic, stented (TAVR) valve present in the aortic position. Aortic valve mean gradient measures 14.0 mmHg. Aortic valve Vmax measures  2.61 m/s.  7. The inferior vena cava is normal in size with greater than 50% respiratory variability, suggesting right atrial pressure of 3 mmHg. Conclusion(s)/Recommendation(s): Findings are concerning for vegetation/infective endocarditis as detailed above. FINDINGS  Left Ventricle: Left ventricular ejection fraction, by estimation, is 60 to 65%. The left ventricle has normal function. The left ventricle has no regional wall motion abnormalities. The left ventricular internal cavity size was normal in size. There is  moderate left ventricular hypertrophy. Right Ventricle: The right ventricular size is normal. No increase in right ventricular wall thickness. Right ventricular systolic function is normal. Left Atrium: Left atrial size was normal in size. No left atrial/left atrial appendage thrombus was detected. Right Atrium: Right atrial size was normal in size. Pericardium: There is no evidence of pericardial effusion. Mitral Valve: The mitral valve is degenerative in appearance. There is moderate thickening of the mitral valve leaflet(s). There is moderate calcification of the mitral valve leaflet(s). Normal mobility of the mitral valve leaflets. Moderate to severe mitral annular calcification. No evidence of mitral valve regurgitation. Moderate mitral valve stenosis. MV peak gradient, 17.0 mmHg. The mean mitral valve gradient is 8.0 mmHg. Tricuspid Valve: The tricuspid valve is normal in structure. Tricuspid valve regurgitation is not demonstrated. No evidence of tricuspid stenosis. Aortic Valve: Medtronic Evolut Pro Plus 23 mm transcatheter heart valve (VALVE IN VALVE). There is trivial to mild perivalvular leakage in 12-1 o'clock position on TEE (posterior). Seen on limited transthoracic ECHO post op. No rocking motion. The aortic valve has been repaired/replaced. Aortic valve regurgitation is not visualized. No aortic  stenosis is present. Aortic valve mean gradient measures 14.0 mmHg. Aortic valve peak  gradient measures 27.2 mmHg. There is a 23 mm Medtronic, stented (TAVR) valve present in the aortic position. Pulmonic Valve: The pulmonic valve was normal in structure. Pulmonic valve regurgitation is not visualized. No evidence of pulmonic stenosis. Aorta: The aortic root is normal in size and structure. Venous: The inferior vena cava is normal in size with greater than 50% respiratory variability, suggesting right atrial pressure of 3 mmHg. IAS/Shunts: No atrial level shunt detected by color flow Doppler. Additional Comments: There are 2 moderate sized linear mobile echodensities attached to pacemaker lead consistent with vegetation. A pacer wire is visualized.  AORTIC VALVE AV Vmax:      261.00 cm/s AV Vmean:     173.000 cm/s AV VTI:       0.416 m AV Peak Grad: 27.2 mmHg AV Mean Grad: 14.0 mmHg MITRAL VALVE MV Peak grad: 17.0 mmHg MV Mean grad: 8.0 mmHg MV Vmax:      2.06 m/s MV Vmean:     122.0 cm/s Candee Furbish MD Electronically signed by Candee Furbish MD Signature Date/Time: 07/20/2019/4:42:28 PM    Final     Assessment/Plan  1. Infection of pacemaker lead wire, subsequent encounter -WBC 7.7 -Continue ceftriaxone 2 g via IV daily until 09/21/2019 -Follow-up with ID  2. AKI (acute kidney injury) ([place]) - creatinine 1.53, improving - monitor BMP  3. Type 2 diabetes mellitus with stage 3b chronic kidney disease, with long-term current use  of insulin ([place]) Lab Results  Component Value Date   HGBA1C 8.6 (A) 05/19/2019   -CBGs stable -Continue Xultophy 14 units daily  4. Essential hypertension -Controlled, continue amlodipine 5 mg daily and metoprolol succinate ER 25 mg 1 tab daily  5. Coronary artery disease involving native coronary artery of native heart without angina pectoris -Denies chest pains, continue Plavix 75 mg 1 tab daily, aspirin EC 81 mg 1 tab daily and atorvastatin 80 mg 1 tab in the evening     Family/ staff Communication: Discussed plan of care with resident and charge  nurse.  Labs/tests ordered: None  Goals of care:   Short-term care   Durenda Age, DNP, FNP-BC [facility] [phone] (Monday-Friday 8:00 a.m. - 5:00 p.m.) [phone] (after hours)

## 2019-08-11 ENCOUNTER — Other Ambulatory Visit: Payer: Self-pay

## 2019-08-11 ENCOUNTER — Encounter: Payer: Self-pay | Admitting: Physician Assistant

## 2019-08-11 ENCOUNTER — Other Ambulatory Visit: Payer: Self-pay | Admitting: Internal Medicine

## 2019-08-11 ENCOUNTER — Ambulatory Visit (INDEPENDENT_AMBULATORY_CARE_PROVIDER_SITE_OTHER): Payer: Medicare Other | Admitting: Physician Assistant

## 2019-08-11 VITALS — BP 140/75 | HR 72 | Temp 94.6°F | Ht 65.0 in

## 2019-08-11 DIAGNOSIS — Z79899 Other long term (current) drug therapy: Secondary | ICD-10-CM | POA: Diagnosis not present

## 2019-08-11 DIAGNOSIS — Z950 Presence of cardiac pacemaker: Secondary | ICD-10-CM | POA: Diagnosis not present

## 2019-08-11 DIAGNOSIS — I5033 Acute on chronic diastolic (congestive) heart failure: Secondary | ICD-10-CM | POA: Diagnosis not present

## 2019-08-11 DIAGNOSIS — Z952 Presence of prosthetic heart valve: Secondary | ICD-10-CM | POA: Diagnosis not present

## 2019-08-11 DIAGNOSIS — E7800 Pure hypercholesterolemia, unspecified: Secondary | ICD-10-CM

## 2019-08-11 DIAGNOSIS — I2510 Atherosclerotic heart disease of native coronary artery without angina pectoris: Secondary | ICD-10-CM

## 2019-08-11 DIAGNOSIS — I130 Hypertensive heart and chronic kidney disease with heart failure and stage 1 through stage 4 chronic kidney disease, or unspecified chronic kidney disease: Secondary | ICD-10-CM

## 2019-08-11 DIAGNOSIS — R0602 Shortness of breath: Secondary | ICD-10-CM

## 2019-08-11 DIAGNOSIS — N179 Acute kidney failure, unspecified: Secondary | ICD-10-CM

## 2019-08-11 DIAGNOSIS — I10 Essential (primary) hypertension: Secondary | ICD-10-CM

## 2019-08-11 DIAGNOSIS — N183 Chronic kidney disease, stage 3 unspecified: Secondary | ICD-10-CM

## 2019-08-11 DIAGNOSIS — I5030 Unspecified diastolic (congestive) heart failure: Secondary | ICD-10-CM

## 2019-08-11 MED ORDER — FUROSEMIDE 40 MG PO TABS
60.0000 mg | ORAL_TABLET | Freq: Two times a day (BID) | ORAL | 6 refills | Status: DC
Start: 2019-08-11 — End: 2019-08-24

## 2019-08-11 MED ORDER — POTASSIUM CHLORIDE ER 20 MEQ PO TBCR: 20.0000 meq | EXTENDED_RELEASE_TABLET | Freq: Every day | ORAL | 6 refills | Status: DC
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
## 2019-08-11 NOTE — Patient Instructions (Signed)
Medication Instructions:  INCREASE LASIX $RemoveBefore'60MG'[id number]$  TWICE DAILY INCREASE POTASSIUM 20MEQ DAILY  *If you need a refill on your cardiac medications before your next appointment, please call your pharmacy*  Lab Work: [place] BMET TODAY(ALREADY DONE IN THE OFFICE TODAY AND BMET IN 5-7 DAYS If you have labs (blood work) drawn today and your tests are completely normal, you will receive your results only by:  [place] (if you have MyChart) OR A paper copy in the mail.  If you have any lab test that is abnormal or we need to change your treatment, we will call you to review the results. You may go to any [facility] that is convenient for you however, we do have a lab in our office that is able to assist you. You DO NOT need an appointment for our lab. The lab is open 8:00am and closes at 4:00pm. Lunch 12:45 - 1:45pm.  Testing/Procedures: Chest xray(PREFERABLY 08-12-2019, OR ASAP) - Your physician has requested that you have a chest xray, is a fast and painless imaging test that uses certain electromagnetic waves to create pictures of the structures in and around your chest. This test can help diagnose and monitor conditions such as pneumonia and other lung issues his will be done at [facility] [address], [place]. If you should need to call them their phone number is [phone].  Follow-Up: Your next appointment:  1-2 week(s) In Person with You may see [name], MD or one of the following Advanced Practice Providers on your designated Care Team:  Melina Copa, PA-C  Ermalinda Barrios, PA-C  FOLLOW UP WITH DEVICE CLINIC IN 1-2 WEEKS  FOLLOW UP WITH DR CAMNITZ IN MID-July  FOLLOW UP WITH [facility]-DR Graylon Good 08-25-2019 $RemoveBefor'@245PM'[id number]$   At [facility], you and your health needs are our priority.  As part of our continuing mission to provide you with exceptional heart care, we have created designated Provider Care Teams.  These Care Teams include your primary Cardiologist (physician) and Advanced  Practice Providers (APPs -  Physician Assistants and Nurse Practitioners) who all work together to provide you with the care you need, when you need it.

## 2019-08-12 ENCOUNTER — Ambulatory Visit: Payer: Self-pay | Admitting: *Deleted

## 2019-08-12 DIAGNOSIS — E1122 Type 2 diabetes mellitus with diabetic chronic kidney disease: Secondary | ICD-10-CM

## 2019-08-12 DIAGNOSIS — I5032 Chronic diastolic (congestive) heart failure: Secondary | ICD-10-CM

## 2019-08-12 DIAGNOSIS — Z794 Long term (current) use of insulin: Secondary | ICD-10-CM

## 2019-08-12 DIAGNOSIS — I10 Essential (primary) hypertension: Secondary | ICD-10-CM

## 2019-08-12 DIAGNOSIS — N1832 Chronic kidney disease, stage 3b: Secondary | ICD-10-CM

## 2019-08-12 LAB — BASIC METABOLIC PANEL
BUN/Creatinine Ratio: 26 (ref 12–28)
BUN: 45 mg/dL — ABNORMAL HIGH (ref 8–27)
CO2: 27 mmol/L (ref 20–29)
Calcium: 9 mg/dL (ref 8.7–10.3)
Chloride: 98 mmol/L (ref 96–106)
Creatinine, Ser: 1.7 mg/dL — ABNORMAL HIGH (ref 0.57–1.00)
GFR calc Af Amer: 33 mL/min/{1.73_m2} — ABNORMAL LOW (ref >59)
GFR calc non Af Amer: 29 mL/min/{1.73_m2} — ABNORMAL LOW (ref >59)
Glucose: 171 mg/dL — ABNORMAL HIGH (ref 65–99)
Potassium: 4.9 mmol/L (ref 3.5–5.2)
Sodium: 139 mmol/L (ref 134–144)

## 2019-08-12 LAB — PRO B NATRIURETIC PEPTIDE: NT-Pro BNP: 2473 pg/mL — ABNORMAL HIGH (ref 0–738)

## 2019-08-12 NOTE — Chronic Care Management (AMB) (Signed)
  Care Management   Note  08/12/2019 Name: [name] MRN: [id number] DOB: Dec 22, 1945   Patient remains a resident of [facility] for short term rehabilitation since her hospitalizations at [facility] from 5/23-5/28, then transferred to [facility] on 5/28 for lead extraction due to bacteremia and now s/p device explant with leadless PPM.   The care management team will monitor patient for discharge from [facility] via [facility] and complete a transition of care call within 48 hours of patient's discharge to home.   Kelli Churn RN, CCM, [facility] RN Care Manager [phone]

## 2019-08-14 ENCOUNTER — Encounter: Payer: Self-pay | Admitting: Physician Assistant

## 2019-08-15 ENCOUNTER — Encounter: Payer: Self-pay | Admitting: *Deleted

## 2019-08-15 ENCOUNTER — Other Ambulatory Visit: Payer: Self-pay | Admitting: Cardiology

## 2019-08-16 LAB — BASIC METABOLIC PANEL WITH GFR
BUN: 59 — AB (ref 4–21)
CO2: 27 — AB (ref 13–22)
Chloride: 104 (ref 99–108)
Creatinine: 1.6 — AB (ref 0.5–1.1)
Glucose: 118
Potassium: 4.1 (ref 3.4–5.3)
Sodium: 141 (ref 137–147)

## 2019-08-16 LAB — COMPREHENSIVE METABOLIC PANEL WITH GFR
Calcium: 8.9 (ref 8.7–10.7)
GFR calc Af Amer: 37.12
GFR calc non Af Amer: 32.03

## 2019-08-19 ENCOUNTER — Non-Acute Institutional Stay (SKILLED_NURSING_FACILITY): Payer: Medicare Other | Admitting: Adult Health

## 2019-08-19 ENCOUNTER — Encounter: Payer: Self-pay | Admitting: Adult Health

## 2019-08-19 DIAGNOSIS — Z794 Long term (current) use of insulin: Secondary | ICD-10-CM

## 2019-08-19 DIAGNOSIS — I2510 Atherosclerotic heart disease of native coronary artery without angina pectoris: Secondary | ICD-10-CM

## 2019-08-19 DIAGNOSIS — E1122 Type 2 diabetes mellitus with diabetic chronic kidney disease: Secondary | ICD-10-CM

## 2019-08-19 DIAGNOSIS — K5901 Slow transit constipation: Secondary | ICD-10-CM

## 2019-08-19 DIAGNOSIS — E1121 Type 2 diabetes mellitus with diabetic nephropathy: Secondary | ICD-10-CM

## 2019-08-19 DIAGNOSIS — N1832 Chronic kidney disease, stage 3b: Secondary | ICD-10-CM

## 2019-08-19 DIAGNOSIS — T827XXD Infection and inflammatory reaction due to other cardiac and vascular devices, implants and grafts, subsequent encounter: Secondary | ICD-10-CM | POA: Diagnosis not present

## 2019-08-19 DIAGNOSIS — I5031 Acute diastolic (congestive) heart failure: Secondary | ICD-10-CM

## 2019-08-19 DIAGNOSIS — I10 Essential (primary) hypertension: Secondary | ICD-10-CM

## 2019-08-19 NOTE — Progress Notes (Signed)
Location:  [place] Room Number: 217-A Place of Service:  SNF (31) Provider:  Durenda Age, DNP, FNP-BC  Patient Care Team: Angelica Pou, MD as PCP - General (Internal Medicine) Dorothy Spark, MD as PCP - Cardiology (Cardiology) Constance Haw, MD as PCP - Electrophysiology (Cardiology) Chrismon, Amber as Social Worker Barrington Ellison, RN as Case Manager  Extended Emergency Contact Information Primary Emergency Contact: Lieutenant [place] of [place] Phone: [phone] Mobile Phone: [phone] Relation: Daughter Secondary Emergency Contact: Armstrong,[name]  [place] Phone: [phone] Relation: Daughter  Code Status:  Full Code  Goals of care: Advanced Directive information Advanced Directives 07/18/2019  Does Patient Have a Medical Advance Directive? No  Type of Advance Directive -  Does patient want to make changes to medical advance directive? -  Copy of [place] in Chart? -  Would patient like information on creating a medical advance directive? No - Guardian declined  Pre-existing out of facility DNR order (yellow form or pink MOST form) -     Chief Complaint  Patient presents with  . Acute Visit    Routine short-term rehabilitation visit    HPI:  Pt is a 74 y.o. female seen today for a short-term rehab he has a PMH of essential hypertension, dyslipidemia, history of stroke, osteoporosis chronic kidney disease, CAD, severe aortic stenosis, history of breast cancer and history of chronic diastolic CHF.  She was admitted to [facility] on 07/28/19 post [facility] hospitalization 07/22/19 to 07/28/19 for bacteremia with pacemaker lead vegetation. Initially, she presented to [facility] ED on 5/23 with evaluation of sepsis and bilateral pulmonary infiltrates on chest x-ray. She was then transferred to [facility] from [facility] on  07/22/19 after TEE from OSH revealed vegetation on her device leads. Records revealed that she had positive blood cultures on 07/17/19 for group B strep and received 1 dose of penicillin G prior to transfer. She is a Sales promotion account executive Witness and will not be accepting any blood products. On 5/28, she was seen by infectious disease and recommended Vancomycin while awaiting new blood cultures. She had a successful device and lead explant with micra leadless pacemaker implant. Her creatinine went up to 1.6 on 5/29. Infectious disease saw her daily and her antibiotic therapy was changed to penicillin G with Vancomycin and was  being discontinued. On 5/30, AKI was noted with creatinine up to 2.1 and was unable to void without in-and-out catheterization. Patient's creatinine improved with diuretic holiday, creatinine down to 1.67. She had urinary retention on bladder scan requiring straight catheterization. Along this same time, she was complaining of constipation X 5 days. After this was addressed, she was able to void on her own. Single lumen PICC was placed on 07/28/19, on her right arm.  She was seen in her room today. She complained of constipation. Ceftriaxone still being given daily for infection of pacemaker lead wire.  No untoward reactions reported. Latest creatinine 1.56, GFR 37.12. Denies urinary retention. CBGs ranging from 110 to 206. She continues to take Xultophy 14 units daily for DM.    Past Medical History:  Diagnosis Date  . Acute on chronic diastolic CHF (congestive heart failure) ([place]) 06/22/2017  . Acute on chronic renal failure ([place]) 05/25/2011  . Adenocarcinoma of breast ([place]) 1996   Completed tamoxifen and had mastectomy.  . Aortic stenosis, severe    s/p aortic valve replacement with porcine valve 06/2004.  ECHO  2010 EF 29%, LVH, diastolic dysfxn, Bioprostetic aoritc valve, mild AS. ECHO 2013 EF 60%, Nl aortic artificial valve, dynamic obstruction in the outflow tract   Class IIb rec for annual  TTE after 5 yrs. She had a TTE 2013    . Arthritis   . CAD (coronary artery disease) 2006   s/p CABG (5/06) w/ saphenous vein to RCA at time of AVR  . CKD (chronic kidney disease) stage 2, GFR 60-89 ml/min 06/03/2011   She is no longer taking NSAIDs.   Marland Kitchen CVA (cerebral infarction) 2006   Post-op from AVR. Presumed embolic in nature. Carotid stenosis of R 60-79%. Repeat dopplers 4/10 no R stenosis and L stenosis of 1-29%.  . Depression    Controlled on Paxil  . Diabetes mellitus 1992   Dx 04/25/1990. Now insulin dependent, started 2008. On ACEI.   . Diverticulosis 2001  . History of 2019 novel coronavirus disease (COVID-19)   . Hyperlipidemia    Mgmt with a statin  . Hypertension    Requires 4 drug tx  . Osteoporosis 2006   DEXA 10/06 : L femur T -2.8, R -2.7. Lumbar T -2.4. On bisphosphonates and  Calcium / Vit D.  . Presence of permanent cardiac pacemaker   . Refusal of blood transfusions as patient is Jehovah's Witness   . Stroke [facility])    Past Surgical History:  Procedure Laterality Date  . ABDOMINAL HYSTERECTOMY  1987   for fibroids  . AORTIC VALVE REPLACEMENT  2006  . CHOLECYSTECTOMY    . CORONARY ARTERY BYPASS GRAFT  2006   Saphenous vein to RCA at time of AVR. Course complicated by acute respiratory failure, post-op PTX, ARI, ileus, CVA  . MASTECTOMY Left 1995   L for adenocarcinoma  . PACEMAKER IMPLANT N/A 02/09/2019   Procedure: PACEMAKER IMPLANT;  Surgeon: Constance Haw, MD;  Location: [place] CV LAB;  Service: Cardiovascular;  Laterality: N/A;  . RIGHT HEART CATH AND CORONARY/GRAFT ANGIOGRAPHY N/A 02/09/2019   Procedure: RIGHT HEART CATH AND CORONARY/GRAFT ANGIOGRAPHY;  Surgeon: Sherren Mocha, MD;  Location: [place] CV LAB;  Service: Cardiovascular;  Laterality: N/A;  . TEE WITHOUT CARDIOVERSION N/A 07/20/2019   Procedure: TRANSESOPHAGEAL ECHOCARDIOGRAM (TEE);  Surgeon: Jerline Pain, MD;  Location: [facility] ENDOSCOPY;  Service: Cardiovascular;  Laterality: N/A;   . TRANSCATHETER AORTIC VALVE REPLACEMENT, TRANSFEMORAL N/A 03/01/2019   Procedure: TRANSCATHETER AORTIC VALVE REPLACEMENT, TRANSFEMORAL;  Surgeon: Sherren Mocha, MD;  Location: [place] CV LAB;  Service: Open Heart Surgery;  Laterality: N/A;    Allergies  Allergen Reactions  . 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," as the patient is a Jehovah's Witness    Outpatient Encounter Medications as of 08/19/2019  Medication Sig  . acetaminophen (TYLENOL) 325 MG tablet Take 650 mg by mouth every 6 (six) hours as needed.  . Amino Acids-Protein Hydrolys (FEEDING SUPPLEMENT, PRO-STAT SUGAR FREE 64,) LIQD Take 30 mLs by mouth 2 (two) times daily with a meal.  . amLODipine (NORVASC) 5 MG tablet Take 1 tablet (5 mg total) by mouth daily.  Marland Kitchen amoxicillin (AMOXIL) 500 MG capsule Take 2,000 mg by mouth as needed. Prior to dental procedure  . aspirin 81 MG EC tablet TAKE 1 TABLET (81 MG TOTAL) BY MOUTH DAILY. SWALLOW WHOLE.  Marland Kitchen atorvastatin (LIPITOR) 80 MG tablet Take 1 tablet (80 mg total) by mouth at bedtime.  . cefTRIAXone 2 g in sodium chloride 0.9 % 100 mL Inject 2 g into the vein  daily. Piggyback for endocarditis  . clopidogrel (PLAVIX) 75 MG tablet Take 1 tablet (75 mg total) by mouth daily with breakfast.  . diclofenac Sodium (VOLTAREN) 1 % GEL APPLY 2 GRAMS TOPICALLY 4 TIMES DAILY  . furosemide (LASIX) 40 MG tablet Take 1.5 tablets (60 mg total) by mouth 2 (two) times daily.  . hydrALAZINE (APRESOLINE) 50 MG tablet Take 1 tablet (50 mg total) by mouth 3 (three) times daily.  . Insulin Degludec-Liraglutide (XULTOPHY) 100-3.6 UNIT-MG/ML SOPN Inject 14 Units into the skin daily.  . metoprolol succinate (TOPROL-XL) 25 MG 24 hr tablet Take 1 tablet (25 mg total) by mouth daily.  Marland Kitchen PARoxetine (PAXIL) 30 MG tablet TAKE 1 TABLET BY MOUTH DAILY  . potassium chloride 20 MEQ TBCR Take 20 mEq by mouth daily.  . sennosides-docusate sodium (SENOKOT-S) 8.6-50 MG tablet Take 2 tablets by mouth at  bedtime.  Marland Kitchen EASY TOUCH PEN NEEDLES 31G X 5 MM MISC USE TO INJECT INSULIN INTO THE SKIN (Patient not taking: Reported on 08/19/2019)   No facility-administered encounter medications on file as of 08/19/2019.    Review of Systems  GENERAL: No change in appetite, no fatigue, no weight changes, no fever, chills or weakness MOUTH and THROAT: Denies oral discomfort, gingival pain or bleeding RESPIRATORY: no cough, SOB, DOE, wheezing, hemoptysis CARDIAC: No chest pain, edema or palpitations GI: No abdominal pain, +constipation GU: Denies dysuria, frequency, hematuria, incontinence, or discharge NEUROLOGICAL: Denies dizziness, syncope or headache PSYCHIATRIC: Denies feelings of depression or anxiety. No report of hallucinations, insomnia, paranoia, or agitation  Immunization History  Administered Date(s) Administered  . Fluad Quad(high Dose 65+) 02/11/2019  . Influenza Split 11/12/2010, 11/10/2011  . Influenza Whole 11/07/2009  . Influenza,inj,Quad PF,6+ Mos 10/19/2012, 01/05/2014, 11/08/2015, 01/29/2017  . Pneumococcal Conjugate-13 06/07/2014  . Pneumococcal Polysaccharide-23 12/26/1994, 03/05/2010  . Td 09/24/1993  . Tdap 11/12/2010, 07/12/2013   Pertinent  Health Maintenance Due  Topic Date Due  . OPHTHALMOLOGY EXAM  11/07/2016  . FOOT EXAM  07/10/2017  . HEMOGLOBIN A1C  08/19/2019  . INFLUENZA VACCINE  09/25/2019  . LIPID PANEL  02/07/2020  . DEXA SCAN  Completed  . PNA vac Low Risk Adult  Completed   Fall Risk  08/02/2019 06/16/2019 05/19/2019 05/04/2019 04/27/2019  Falls in the past year? 1 1 1  0 0  Comment - pt denies-but dtr stated pt had a fall in last mths pt denies falls-but dtr states pt has had a fall - -  Number falls in past yr: 1 0 0 - 0  Injury with Fall? 0 0 0 - 0  Risk Factor Category  - - - - -  Risk for fall due to : Impaired mobility History of fall(s);Impaired balance/gait;Impaired mobility;Medication side effect History of fall(s);Impaired balance/gait;Impaired  mobility;Medication side effect No Fall Risks -  Risk for fall due to: Comment - - - - -  Follow up Falls evaluation completed Falls evaluation completed;Falls prevention discussed Falls evaluation completed Falls evaluation completed -     Vitals:   08/19/19 1244  BP: 128/74  Pulse: 66  Resp: 18  Temp: 97.7 F (36.5 C)  TempSrc: Oral  Weight: 168 lb 3.2 oz (76.3 kg)  Height: 5\' 5"  (1.651 m)   Body mass index is 27.99 kg/m.  Physical Exam  GENERAL APPEARANCE: Well nourished. In no acute distress. Normal body habitus SKIN:  Right chest with dressing MOUTH and THROAT: Lips are without lesions. Oral mucosa is moist and without lesions.  RESPIRATORY: Breathing is even &  unlabored, BS CTAB CARDIAC: RRR, no murmur,no extra heart sounds,BLE 2+ edema GI: Abdomen soft, normal BS, no masses, no tenderness EXTREMITIES:  Able to move X 4 extremities. NEUROLOGICAL: There is no tremor. Speech is clear. Alert and oriented X 3. Left-sided weakness PSYCHIATRIC:  Affect and behavior are appropriate  Labs reviewed: Recent Labs    02/07/19 1323 02/07/19 1527 02/08/19 0556 02/09/19 0508 03/02/19 0253 05/04/19 1144 07/20/19 0301 07/20/19 0301 07/21/19 0356 07/21/19 0356 08/02/19 0000 08/08/19 0000 08/11/19 1638  NA 143   < > 145   < > 140   < > 137   < > 139  --  139 139 139  K 3.3*   < > 4.1   < > 3.7   < > 3.6   < > 3.6   < > 4.3 4.1 4.9  CL 110   < > 109   < > 105   < > 98   < > 102   < > 103 101 98  CO2 23   < > 27   < > 26   < > 27   < > 29   < > 26* 26* 27  GLUCOSE 203*   < > 143*   < > 115*   < > 239*  --  209*  --   --   --  171*  BUN 25*   < > 26*   < > 24*   < > 26*   < > 20  --  47* 41* 45*  CREATININE 1.48*   < > 1.58*   < > 1.47*   < > 1.56*   < > 1.35*  --  1.7* 1.5* 1.70*  CALCIUM 8.2*   < > 9.0   < > 8.8*   < > 8.6*   < > 8.5*   < > 9.1 9.4 9.0  MG 1.7   < > 2.3  --  1.7  --  1.8  --   --   --   --   --   --   PHOS 3.6  --   --   --   --   --   --   --   --   --    --   --   --    < > = values in this interval not displayed.   Recent Labs    02/07/19 1323 02/24/19 1020 07/17/19 2150  AST 13* 16 17  ALT $Re'12 13 13  'GpT$ ALKPHOS 96 125 141*  BILITOT 1.0 0.6 0.8  PROT 6.5 7.8 7.6  ALBUMIN 3.1* 3.4* 3.4*   Recent Labs    07/17/19 2150 07/17/19 2150 07/19/19 0219 07/19/19 0219 07/20/19 0723 08/02/19 0000 08/08/19 0000  WBC 18.9*   < > 16.1*   < > 9.2 9.1 7.7  NEUTROABS 17.3*  --   --   --   --  6 5  HGB 12.8   < > 10.3*   < > 10.3* 9.5* 10.9*  HCT 40.0   < > 32.2*   < > 32.4* 29* 33*  MCV 94.8  --  93.9  --  93.9  --   --   PLT 256   < > 184   < > 193 314 299   < > = values in this interval not displayed.   Lab Results  Component Value Date   TSH 1.630 02/07/2019   Lab Results  Component Value Date  HGBA1C 8.6 (A) 05/19/2019   Lab Results  Component Value Date   CHOL 99 02/07/2019   HDL 26 (L) 02/07/2019   LDLCALC 50 02/07/2019   TRIG 117 02/07/2019   CHOLHDL 3.8 02/07/2019    Significant Diagnostic Results in last 30 days:  DG Chest Port 1 View  Result Date: 07/21/2019 CLINICAL DATA:  Central line placement EXAM: PORTABLE CHEST 1 VIEW COMPARISON:  07/17/2019, 06/24/2019 FINDINGS: Post sternotomy changes. Right-sided pacing device with similar appearance of pacing leads. Status post TAVR. Possible right ascending catheter tip projecting over the low right atrium. Otherwise no additional central venous catheter tubing is visible on the current image. Stable enlarged cardiomediastinal silhouette with aortic atherosclerosis. Improved aeration with decreased vascular congestion and edema. No pneumothorax. IMPRESSION: 1. Possible right ascending catheter tip projecting over the low right atrium/cavoatrial junction, otherwise no central venous catheter tubing is visible on the current image. 2. Improved aeration with decreased vascular congestion and edema. Stable cardiomegaly. Electronically Signed   By: Donavan Foil M.D.   On: 07/21/2019  15:51    Assessment/Plan  1. Infection of pacemaker lead wire, subsequent encounter -Continue ceftriaxone 2 g via IV daily -Follow-up with infectious disease  2. Type 2 diabetes mellitus with stage 3b chronic kidney disease, with long-term current use of insulin (HCC) Lab Results  Component Value Date   HGBA1C 8.6 (A) 05/19/2019   -CBGs stable - continue Xultophy 14 units SQ daily  3. Acute on chronic diastolic congestive heart failure (HCC) - no SOB, recently followed up with cardiology on 08/11/19, Lasix was then increased to 60 mg twice daily and potassium to 20 M EQ daily -Continue metoprolol succinate ER and hydralazine  4. Coronary artery disease involving native coronary artery of native heart without angina pectoris -No chest pains, continue Plavix and atorvastatin  5. Essential hypertension -Stable, continue amlodipine, metoprolol succinate ER and hydralazine  6. Slow transit constipation -Start senna S 8.6/50 mg 2 tabs at bedtime    Family/ staff Communication: Discussed plan of care with resident and charge nurse.  Labs/tests ordered: CBC and BMP  Goals of care:   Short-term care   Durenda Age, DNP, FNP-BC [facility] [phone] (Monday-Friday 8:00 a.m. - 5:00 p.m.) [phone] (after hours)

## 2019-08-23 ENCOUNTER — Ambulatory Visit (INDEPENDENT_AMBULATORY_CARE_PROVIDER_SITE_OTHER): Payer: Medicare Other | Admitting: Emergency Medicine

## 2019-08-23 ENCOUNTER — Encounter: Payer: Self-pay | Admitting: Cardiology

## 2019-08-23 ENCOUNTER — Ambulatory Visit (INDEPENDENT_AMBULATORY_CARE_PROVIDER_SITE_OTHER): Payer: Medicare Other | Admitting: Cardiology

## 2019-08-23 ENCOUNTER — Other Ambulatory Visit: Payer: Self-pay

## 2019-08-23 VITALS — BP 134/74 | HR 69 | Ht 65.5 in | Wt 162.4 lb

## 2019-08-23 DIAGNOSIS — Z952 Presence of prosthetic heart valve: Secondary | ICD-10-CM | POA: Diagnosis not present

## 2019-08-23 DIAGNOSIS — I442 Atrioventricular block, complete: Secondary | ICD-10-CM

## 2019-08-23 DIAGNOSIS — Z950 Presence of cardiac pacemaker: Secondary | ICD-10-CM

## 2019-08-23 DIAGNOSIS — I38 Endocarditis, valve unspecified: Secondary | ICD-10-CM

## 2019-08-23 DIAGNOSIS — I2510 Atherosclerotic heart disease of native coronary artery without angina pectoris: Secondary | ICD-10-CM

## 2019-08-23 DIAGNOSIS — I5043 Acute on chronic combined systolic (congestive) and diastolic (congestive) heart failure: Secondary | ICD-10-CM | POA: Diagnosis not present
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
## 2019-08-23 NOTE — Patient Instructions (Addendum)
Medication Instructions:   Your physician recommends that you continue on your current medications as directed. Please refer to the Current Medication list given to you today.  *If you need a refill on your cardiac medications before your next appointment, please call your pharmacy*  Lab Work:  TODAY--BMET AND PRO-BNP  If you have labs (blood work) drawn today and your tests are completely normal, you will receive your results only by: Marland Kitchen MyChart Message (if you have MyChart) OR . A paper copy in the mail If you have any lab test that is abnormal or we need to change your treatment, we will call you to review the results.   Follow-Up:  4 WEEKS IN THE OFFICE WITH HAO MENG PA-C   Other Instructions: You do NOT need any further dressings over your pacemaker site. You may wash the site with soap and water. Monitor for signs of infection and call the office if you suspect infection.   Call the device clinic at [phone]

## 2019-08-24 ENCOUNTER — Encounter: Payer: Self-pay | Admitting: Adult Health

## 2019-08-24 ENCOUNTER — Telehealth: Payer: Self-pay | Admitting: *Deleted

## 2019-08-24 ENCOUNTER — Non-Acute Institutional Stay (SKILLED_NURSING_FACILITY): Payer: Medicare Other | Admitting: Adult Health

## 2019-08-24 DIAGNOSIS — K5901 Slow transit constipation: Secondary | ICD-10-CM | POA: Diagnosis not present

## 2019-08-24 DIAGNOSIS — E1121 Type 2 diabetes mellitus with diabetic nephropathy: Secondary | ICD-10-CM | POA: Diagnosis not present

## 2019-08-24 DIAGNOSIS — Z794 Long term (current) use of insulin: Secondary | ICD-10-CM

## 2019-08-24 DIAGNOSIS — T827XXD Infection and inflammatory reaction due to other cardiac and vascular devices, implants and grafts, subsequent encounter: Secondary | ICD-10-CM

## 2019-08-24 DIAGNOSIS — N1832 Chronic kidney disease, stage 3b: Secondary | ICD-10-CM | POA: Diagnosis not present

## 2019-08-24 DIAGNOSIS — E1122 Type 2 diabetes mellitus with diabetic chronic kidney disease: Secondary | ICD-10-CM

## 2019-08-24 LAB — BASIC METABOLIC PANEL
BUN/Creatinine Ratio: 27 (ref 12–28)
BUN: 44 mg/dL — ABNORMAL HIGH (ref 8–27)
CO2: 25 mmol/L (ref 20–29)
Calcium: 9.4 mg/dL (ref 8.7–10.3)
Chloride: 102 mmol/L (ref 96–106)
Creatinine, Ser: 1.62 mg/dL — ABNORMAL HIGH (ref 0.57–1.00)
GFR calc Af Amer: 35 mL/min/{1.73_m2} — ABNORMAL LOW (ref >59)
GFR calc non Af Amer: 31 mL/min/{1.73_m2} — ABNORMAL LOW (ref >59)
Glucose: 121 mg/dL — ABNORMAL HIGH (ref 65–99)
Potassium: 4.4 mmol/L (ref 3.5–5.2)
Sodium: 143 mmol/L (ref 134–144)

## 2019-08-24 LAB — CUP PACEART INCLINIC DEVICE CHECK
Battery Remaining Longevity: 96 mo
Battery Voltage: 3.19 V
Brady Statistic AS VP Percent: 58.22 %
Brady Statistic AS VS Percent: 0 %
Brady Statistic RV Percent Paced: 99.99 %
Date Time Interrogation Session: 20210629175411
Implantable Pulse Generator Implant Date: 20210528
Lead Channel Impedance Value: 630 Ohm
Lead Channel Pacing Threshold Amplitude: 0.5 V
Lead Channel Pacing Threshold Pulse Width: 0.24 ms
Lead Channel Sensing Intrinsic Amplitude: 15.3 mV
Lead Channel Setting Pacing Amplitude: 2 V
Lead Channel Setting Pacing Pulse Width: 0.24 ms
Lead Channel Setting Sensing Sensitivity: 2 mV

## 2019-08-24 LAB — PRO B NATRIURETIC PEPTIDE: NT-Pro BNP: 3638 pg/mL — ABNORMAL HIGH (ref 0–738)

## 2019-08-24 MED ORDER — FUROSEMIDE 80 MG PO TABS
80.0000 mg | ORAL_TABLET | Freq: Two times a day (BID) | ORAL | 0 refills | Status: DC
Start: 2019-08-24 — End: 2019-08-24

## 2019-08-24 NOTE — Telephone Encounter (Signed)
Spoke with Salley Scarlet at [facility], where the pt is currently residing for rehab.  Informed Erica RN that based on pts lab results from her OV with [place] yesterday, Dr. Meda Coffee recommends that we increase her lasix to 80 mg by mouth twice daily. Per Salley Scarlet, she request that we fax this order to their facility at [phone]  ATTN: Danae Chen, and she will oversee this medication change.  Danae Chen states she will call the pts family as well and inform them of lab results and medication change, per Dr. Meda Coffee.  Asked Danae Chen if faxing the lab report with Dr. Francesca Oman written order in that lab to increase her lasix to 80 mg po bid, would be sufficient enough.  Per Danae Chen RN, this would be acceptable and she will await our fax to make further changes to the pts medication regimen.  Danae Chen RN verbalized understanding and agrees with this plan.

## 2019-08-24 NOTE — Progress Notes (Signed)
Location:  [place] Room Number: 217-A Place of Service:  SNF (31) Provider:  Durenda Age, DNP, FNP-BC  Patient Care Team: [name], MD as PCP - General (Internal Medicine) Dorothy Spark, MD as PCP - Cardiology (Cardiology) Constance Haw, MD as PCP - Electrophysiology (Cardiology) Chrismon, Amber as Social Worker Barrington Ellison, RN as Case Manager  Extended Emergency Contact Information Primary Emergency Contact: Lieutenant Diego [place] of [place] Phone: [phone] Mobile Phone: [phone] Relation: Daughter Secondary Emergency Contact: Armstrong,[name]  [place] Phone: [phone] Relation: Daughter  Code Status:  Full Code   Goals of care: Advanced Directive information Advanced Directives 07/18/2019  Does Patient Have a Medical Advance Directive? No  Type of Advance Directive -  Does patient want to make changes to medical advance directive? -  Copy of [place] in Chart? -  Would patient like information on creating a medical advance directive? No - Guardian declined  Pre-existing out of facility DNR order (yellow form or pink MOST form) -     Chief Complaint  Patient presents with  . Acute Visit    Patient is seen for complaints of constipation.     HPI:  Pt is a 74 y.o. female seen today for constipation. She is a short-term care resident of [facility]. She has a PMH of essential hypertension, dyslipidemia, history of stroke, osteoporosis, chronic kidney disease, CAD, severe aortic stenosis, history of breast cancer and history of chronic diastolic CHF. She was seen in her room today and she complained of constipation X 3 days. Current medication for constipation is Senna-S 2 tabs at bedtime. She stated that she used to take Colace and it works better for her.  CBGs continue from 97 to 206, with outlier 256. She takes Xultophy 14  units daily for diabetes mellitus.   She was admitted to [facility] on 07/28/19 post [facility] hospitalization 07/22/19 to 07/28/19 for infection of pacemaker lead wire.  She is currently having short-term rehabilitation.   Past Medical History:  Diagnosis Date  . Acute on chronic diastolic CHF (congestive heart failure) ([place]) 06/22/2017  . Acute on chronic renal failure ([place]) 05/25/2011  . Adenocarcinoma of breast ([place]) 1996   Completed tamoxifen and had mastectomy.  . Aortic stenosis, severe    s/p aortic valve replacement with porcine valve 06/2004.  ECHO 2010 EF 67%, LVH, diastolic dysfxn, Bioprostetic aoritc valve, mild AS. ECHO 2013 EF 60%, Nl aortic artificial valve, dynamic obstruction in the outflow tract   Class IIb rec for annual TTE after 5 yrs. She had a TTE 2013    . Arthritis   . CAD (coronary artery disease) 2006   s/p CABG (5/06) w/ saphenous vein to RCA at time of AVR  . CKD (chronic kidney disease) stage 2, GFR 60-89 ml/min 06/03/2011   She is no longer taking NSAIDs.   Marland Kitchen CVA (cerebral infarction) 2006   Post-op from AVR. Presumed embolic in nature. Carotid stenosis of R 60-79%. Repeat dopplers 4/10 no R stenosis and L stenosis of 1-29%.  . Depression    Controlled on Paxil  . Diabetes mellitus 1992   Dx 04/25/1990. Now insulin dependent, started 2008. On ACEI.   . Diverticulosis 2001  . History of 2019 novel coronavirus disease (COVID-19)   . Hyperlipidemia    Mgmt with a statin  . Hypertension    Requires 4 drug tx  . Osteoporosis 2006  DEXA 10/06 : L femur T -2.8, R -2.7. Lumbar T -2.4. On bisphosphonates and  Calcium / Vit D.  . Presence of permanent cardiac pacemaker   . Refusal of blood transfusions as patient is Jehovah's Witness   . Stroke [facility])    Past Surgical History:  Procedure Laterality Date  . ABDOMINAL HYSTERECTOMY  1987   for fibroids  . AORTIC VALVE REPLACEMENT  2006  . CHOLECYSTECTOMY    . CORONARY ARTERY BYPASS GRAFT  2006     Saphenous vein to RCA at time of AVR. Course complicated by acute respiratory failure, post-op PTX, ARI, ileus, CVA  . MASTECTOMY Left 1995   L for adenocarcinoma  . PACEMAKER IMPLANT N/A 02/09/2019   Procedure: PACEMAKER IMPLANT;  Surgeon: Constance Haw, MD;  Location: [place] CV LAB;  Service: Cardiovascular;  Laterality: N/A;  . RIGHT HEART CATH AND CORONARY/GRAFT ANGIOGRAPHY N/A 02/09/2019   Procedure: RIGHT HEART CATH AND CORONARY/GRAFT ANGIOGRAPHY;  Surgeon: Sherren Mocha, MD;  Location: [place] CV LAB;  Service: Cardiovascular;  Laterality: N/A;  . TEE WITHOUT CARDIOVERSION N/A 07/20/2019   Procedure: TRANSESOPHAGEAL ECHOCARDIOGRAM (TEE);  Surgeon: Jerline Pain, MD;  Location: [facility] ENDOSCOPY;  Service: Cardiovascular;  Laterality: N/A;  . TRANSCATHETER AORTIC VALVE REPLACEMENT, TRANSFEMORAL N/A 03/01/2019   Procedure: TRANSCATHETER AORTIC VALVE REPLACEMENT, TRANSFEMORAL;  Surgeon: Sherren Mocha, MD;  Location: [place] CV LAB;  Service: Open Heart Surgery;  Laterality: N/A;    Allergies  Allergen Reactions  . 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," as the patient is a Jehovah's Witness    Outpatient Encounter Medications as of 08/24/2019  Medication Sig  . acetaminophen (TYLENOL) 325 MG tablet Take 650 mg by mouth every 6 (six) hours as needed.  . Amino Acids-Protein Hydrolys (FEEDING SUPPLEMENT, PRO-STAT SUGAR FREE 64,) LIQD Take 30 mLs by mouth 2 (two) times daily with a meal.  . amLODipine (NORVASC) 5 MG tablet Take 1 tablet (5 mg total) by mouth daily.  Marland Kitchen amoxicillin (AMOXIL) 500 MG capsule Take 2,000 mg by mouth as needed. Prior to dental procedure  . aspirin 81 MG EC tablet TAKE 1 TABLET (81 MG TOTAL) BY MOUTH DAILY. SWALLOW WHOLE.  Marland Kitchen atorvastatin (LIPITOR) 80 MG tablet Take 1 tablet (80 mg total) by mouth at bedtime.  . cefTRIAXone 2 g in sodium chloride 0.9 % 100 mL Inject 2 g into the vein daily. Piggyback for endocarditis  . clopidogrel  (PLAVIX) 75 MG tablet Take 1 tablet (75 mg total) by mouth daily with breakfast.  . diclofenac Sodium (VOLTAREN) 1 % GEL APPLY 2 GRAMS TOPICALLY 4 TIMES DAILY  . EASY TOUCH PEN NEEDLES 31G X 5 MM MISC USE TO INJECT INSULIN INTO THE SKIN  . furosemide (LASIX) 80 MG tablet Take 1 tablet (80 mg total) by mouth 2 (two) times daily.  . hydrALAZINE (APRESOLINE) 50 MG tablet Take 1 tablet (50 mg total) by mouth 3 (three) times daily.  . Insulin Degludec-Liraglutide (XULTOPHY) 100-3.6 UNIT-MG/ML SOPN Inject 14 Units into the skin daily.  . metoprolol succinate (TOPROL-XL) 25 MG 24 hr tablet Take 1 tablet (25 mg total) by mouth daily.  Marland Kitchen PARoxetine (PAXIL) 30 MG tablet TAKE 1 TABLET BY MOUTH DAILY  . potassium chloride 20 MEQ TBCR Take 20 mEq by mouth daily.  . sennosides-docusate sodium (SENOKOT-S) 8.6-50 MG tablet Take 2 tablets by mouth at bedtime.   No facility-administered encounter medications on file as of 08/24/2019.    Review of Systems  GENERAL: No change in appetite, no fatigue, no weight changes, no fever, chills or weakness MOUTH and THROAT: Denies oral discomfort, gingival pain or bleeding RESPIRATORY: no cough, SOB, DOE, wheezing, hemoptysis CARDIAC: No chest pain or palpitations GI: +constipation NEUROLOGICAL: Denies dizziness, syncope, numbness, or headache PSYCHIATRIC: Denies feelings of depression or anxiety. No report of hallucinations, insomnia, paranoia, or agitation   Immunization History  Administered Date(s) Administered  . Fluad Quad(high Dose 65+) 02/11/2019  . Influenza Split 11/12/2010, 11/10/2011  . Influenza Whole 11/07/2009  . Influenza,inj,Quad PF,6+ Mos 10/19/2012, 01/05/2014, 11/08/2015, 01/29/2017  . Pneumococcal Conjugate-13 06/07/2014  . Pneumococcal Polysaccharide-23 12/26/1994, 03/05/2010  . Td 09/24/1993  . Tdap 11/12/2010, 07/12/2013   Pertinent  Health Maintenance Due  Topic Date Due  . OPHTHALMOLOGY EXAM  11/07/2016  . FOOT EXAM  07/10/2017  .  HEMOGLOBIN A1C  08/19/2019  . INFLUENZA VACCINE  09/25/2019  . LIPID PANEL  02/07/2020  . DEXA SCAN  Completed  . PNA vac Low Risk Adult  Completed   Fall Risk  08/02/2019 06/16/2019 05/19/2019 05/04/2019 04/27/2019  Falls in the past year? 1 1 1  0 0  Comment - pt denies-but dtr stated pt had a fall in last mths pt denies falls-but dtr states pt has had a fall - -  Number falls in past yr: 1 0 0 - 0  Injury with Fall? 0 0 0 - 0  Risk Factor Category  - - - - -  Risk for fall due to : Impaired mobility History of fall(s);Impaired balance/gait;Impaired mobility;Medication side effect History of fall(s);Impaired balance/gait;Impaired mobility;Medication side effect No Fall Risks -  Risk for fall due to: Comment - - - - -  Follow up Falls evaluation completed Falls evaluation completed;Falls prevention discussed Falls evaluation completed Falls evaluation completed -     Vitals:   08/24/19 1625  BP: 138/72  Pulse: 74  Resp: 20  Temp: (!) 97.5 F (36.4 C)  TempSrc: Oral  SpO2: 99%  Weight: 168 lb 3.2 oz (76.3 kg)  Height: 5\' 5"  (1.651 m)   Body mass index is 27.99 kg/m.  Physical Exam  GENERAL APPEARANCE: Well nourished. In no acute distress. Normal body habitus SKIN:  Right chest pacemaker MOUTH and THROAT: Lips are without lesions. Oral mucosa is moist and without lesions. Tongue is normal in shape, size, and color and without lesions RESPIRATORY: Breathing is even & unlabored, BS CTAB CARDIAC: RRR, no murmur,no extra heart sounds, Left foot 1+ edema. Left upper arm SL PICC GI: Abdomen soft, normal BS, no masses, no tenderness EXTREMITIES:  Able to move X 4 extremities NEUROLOGICAL: There is no tremor. Speech is clear.  Alert and oriented X 3. Left hemiparesis. PSYCHIATRIC: Affect and behavior are appropriate  Labs reviewed: Recent Labs    02/07/19 1323 02/07/19 1527 02/08/19 0556 02/09/19 0508 03/02/19 0253 05/04/19 1144 07/20/19 0301 07/20/19 0301 07/21/19 0356  08/02/19 0000 08/11/19 1638 08/16/19 0000 08/23/19 1701  NA 143   < > 145   < > 140   < > 137   < > 139   < > 139 141 143  K 3.3*   < > 4.1   < > 3.7   < > 3.6   < > 3.6   < > 4.9 4.1 4.4  CL 110   < > 109   < > 105   < > 98   < > 102   < > 98 104 102  CO2 23   < >  27   < > 26   < > 27   < > 29   < > 27 27* 25  GLUCOSE 203*   < > 143*   < > 115*   < > 239*   < > 209*  --  171*  --  121*  BUN 25*   < > 26*   < > 24*   < > 26*   < > 20   < > 45* 59* 44*  CREATININE 1.48*   < > 1.58*   < > 1.47*   < > 1.56*   < > 1.35*   < > 1.70* 1.6* 1.62*  CALCIUM 8.2*   < > 9.0   < > 8.8*   < > 8.6*   < > 8.5*   < > 9.0 8.9 9.4  MG 1.7   < > 2.3  --  1.7  --  1.8  --   --   --   --   --   --   PHOS 3.6  --   --   --   --   --   --   --   --   --   --   --   --    < > = values in this interval not displayed.   Recent Labs    02/07/19 1323 02/24/19 1020 07/17/19 2150  AST 13* 16 17  ALT $Re'12 13 13  'bdd$ ALKPHOS 96 125 141*  BILITOT 1.0 0.6 0.8  PROT 6.5 7.8 7.6  ALBUMIN 3.1* 3.4* 3.4*   Recent Labs    07/17/19 2150 07/17/19 2150 07/19/19 0219 07/19/19 0219 07/20/19 0723 08/02/19 0000 08/08/19 0000  WBC 18.9*   < > 16.1*   < > 9.2 9.1 7.7  NEUTROABS 17.3*  --   --   --   --  6 5  HGB 12.8   < > 10.3*   < > 10.3* 9.5* 10.9*  HCT 40.0   < > 32.2*   < > 32.4* 29* 33*  MCV 94.8  --  93.9  --  93.9  --   --   PLT 256   < > 184   < > 193 314 299   < > = values in this interval not displayed.   Lab Results  Component Value Date   TSH 1.630 02/07/2019   Lab Results  Component Value Date   HGBA1C 8.6 (A) 05/19/2019   Lab Results  Component Value Date   CHOL 99 02/07/2019   HDL 26 (L) 02/07/2019   LDLCALC 50 02/07/2019   TRIG 117 02/07/2019   CHOLHDL 3.8 02/07/2019    Significant Diagnostic Results in last 30 days:  CUP PACEART INCLINIC DEVICE CHECK  Result Date: 08/24/2019 Leadless PPM wound check in clinic, added-on per Dr. Meda Coffee. Gauze dressing and 1 remaining steri-strip removed  from right chest incision (from prior PPM explant). Incision edges appear fully approximated and well healed. Instructions provided for SNF to  keep incision OTA and cleanse site daily when bathing. Normal leadless PPM function. Impedance and threshold tests stable. Longevity >8 years. VS <0.1%, AM-VP 58.2%, VP 41.8%. Patient to bring home monitor to upcoming appointment for assistance with setup. ROV with Dr. Curt Bears on 08/25/19.   Assessment/Plan  1. Slow transit constipation - start Colace 100 mg twice a day -  Discontinue senna S  2. Type 2 diabetes mellitus with stage 3b chronic kidney disease, with long-term current use  of insulin ([place]) Lab Results  Component Value Date   HGBA1C 8.6 (A) 05/19/2019   - CBGs stable - continue Xultophy 14 units injection daily - monitor CBGs  3.  Infection of pacemaker lead wire, subsequent encounter -Continue ceftriaxone 2 g IV daily until 09/01/19 -  Follows up with infectious disease -  followed up with cardiology on 08/23/19   Family/ staff Communication: Discussed plan of care with resident and charge nurse.  Labs/tests ordered: None  Goals of care:   Short-term care  Durenda Age, DNP, FNP-BC [facility] and Adult Medicine [phone] (Monday-Friday 8:00 a.m. - 5:00 p.m.) [phone] (after hours)

## 2019-08-24 NOTE — Telephone Encounter (Signed)
-----   Message from Dorothy Spark, MD sent at 08/24/2019 11:47 AM EDT ----- Please increase her lasix to 80 mg PO BID, thank you

## 2019-08-24 NOTE — Progress Notes (Signed)
Leadless PPM wound check in clinic, added-on per Dr. Meda Coffee. Gauze dressing and 1 remaining steri-strip removed from right chest incision (from prior PPM explant). Incision edges appear fully approximated and well healed. Instructions provided for SNF to keep incision OTA and cleanse site daily when bathing. Normal leadless PPM function. Impedance and threshold tests stable. Longevity >8 years. VS <0.1%, AM-VP 58.2%, VP 41.8%. Patient to bring home monitor to upcoming appointment for assistance with setup. ROV with Dr. Curt Bears on 08/25/19.

## 2019-08-25 ENCOUNTER — Other Ambulatory Visit: Payer: Self-pay

## 2019-08-25 ENCOUNTER — Ambulatory Visit (INDEPENDENT_AMBULATORY_CARE_PROVIDER_SITE_OTHER): Payer: Medicare Other | Admitting: Cardiology

## 2019-08-25 ENCOUNTER — Ambulatory Visit: Payer: Medicare Other | Admitting: Internal Medicine

## 2019-08-25 ENCOUNTER — Encounter: Payer: Self-pay | Admitting: Cardiology

## 2019-08-25 VITALS — BP 150/72 | HR 63 | Ht 65.5 in | Wt 158.0 lb

## 2019-08-25 DIAGNOSIS — I442 Atrioventricular block, complete: Secondary | ICD-10-CM | POA: Diagnosis not present

## 2019-08-25 DIAGNOSIS — I2510 Atherosclerotic heart disease of native coronary artery without angina pectoris: Secondary | ICD-10-CM | POA: Diagnosis not present

## 2019-08-25 NOTE — Patient Instructions (Addendum)
Medication Instructions:  Your physician recommends that you continue on your current medications as directed. Please refer to the Current Medication list given to you today.  *If you need a refill on your cardiac medications before your next appointment, please call your pharmacy*   Lab Work: None ordered   Testing/Procedures: None ordered   Follow-Up: Remote monitoring is used to monitor your Pacemaker of ICD from home. This monitoring reduces the number of office visits required to check your device to one time per year. It allows [place] to keep an eye on the functioning of your device to ensure it is working properly. You are scheduled for a device check from home on 10/21/2019. You may send your transmission at any time that day. If you have a wireless device, the transmission will be sent automatically. After your physician reviews your transmission, you will receive a postcard with your next transmission date.  At [facility], you and your health needs are our priority.  As part of our continuing mission to provide you with exceptional heart care, we have created designated Provider Care Teams.  These Care Teams include your primary Cardiologist (physician) and Advanced Practice Providers (APPs -  Physician Assistants and Nurse Practitioners) who all work together to provide you with the care you need, when you need it.  We recommend signing up for the patient portal called "MyChart".  Sign up information is provided on this After Visit Summary.  MyChart is used to connect with patients for Virtual Visits (Telemedicine).  Patients are able to view lab/test results, encounter notes, upcoming appointments, etc.  Non-urgent messages can be sent to your provider as well.   To learn more about what you can do with MyChart, go to [email or web address].    Your next appointment:   6 month(s)  The format for your next appointment:   In Person  Provider:   Allegra Lai, MD   Thank you  for choosing [facility]!!   Trinidad Curet, RN [phone]    Other Instructions

## 2019-08-25 NOTE — Progress Notes (Signed)
Electrophysiology Office Note   Date:  08/25/2019   ID:  [name], DOB [birth year], MRN [id number]  PCP:  Audrey Crews, MD  Cardiologist:  [name] Primary Electrophysiologist:  Audrey Sinha Meredith Leeds, MD    Chief Complaint: Pacemaker    History of Present Illness: [name] is a 74 y.o. female who is being seen today for the evaluation of pacemaker at the request of Audrey Crews, MD. Presenting today for electrophysiology evaluation.  She has a history significant for coronary artery disease status post CABG with AVR in 9735 complicated by CVA, CKD stage II, diabetes, hypertension, hyperlipidemia, diastolic heart failure.  She is now also status post TAVR implant 03/01/2019.  She presented to the hospital December 2020 with acute on chronic heart failure in the setting of complete heart block.  She is now status post maker implanted 02/09/2019.  She presented to [facility] May 2021 with sepsis.  She was found to have vegetations on her pacemaker leads.  She was sent to [facility] for explant and is now status post Medtronic Micra implant.  Today, denies symptoms of palpitations, chest [name], shortness of breath, orthopnea, PND, lower extremity edema, claudication, dizziness, presyncope, syncope, bleeding, or neurologic sequela. The patient is tolerating medications without difficulties.  She feels well today.  She has no complaints of chest [name] or shortness of breath.  She is able to do all of her daily activities, though she does ambulate in a wheelchair.   Past Medical History:  Diagnosis Date  . Acute on chronic diastolic CHF (congestive heart failure) ([place]) 06/22/2017  . Acute on chronic renal failure ([place]) 05/25/2011  . Adenocarcinoma of breast ([place]) 1996   Completed tamoxifen and had mastectomy.  . Aortic stenosis, severe    s/p aortic valve replacement with porcine valve 06/2004.  ECHO 2010 EF 32%, LVH, diastolic dysfxn, Bioprostetic aoritc valve, mild AS.  ECHO 2013 EF 60%, Nl aortic artificial valve, dynamic obstruction in the outflow tract   Class IIb rec for annual TTE after 5 yrs. She had a TTE 2013    . Arthritis   . CAD (coronary artery disease) 2006   s/p CABG (5/06) w/ saphenous vein to RCA at time of AVR  . CKD (chronic kidney disease) stage 2, GFR 60-89 ml/min 06/03/2011   She is no longer taking NSAIDs.   Marland Kitchen CVA (cerebral infarction) 2006   Post-op from AVR. Presumed embolic in nature. Carotid stenosis of R 60-79%. Repeat dopplers 4/10 no R stenosis and L stenosis of 1-29%.  . Depression    Controlled on Paxil  . Diabetes mellitus 1992   Dx 04/25/1990. Now insulin dependent, started 2008. On ACEI.   . Diverticulosis 2001  . History of 2019 novel coronavirus disease (COVID-19)   . Hyperlipidemia    Mgmt with a statin  . Hypertension    Requires 4 drug tx  . Osteoporosis 2006   DEXA 10/06 : L femur T -2.8, R -2.7. Lumbar T -2.4. On bisphosphonates and  Calcium / Vit D.  . Presence of permanent cardiac pacemaker   . Refusal of blood transfusions as patient is Jehovah's Witness   . Stroke [facility])    Past Surgical History:  Procedure Laterality Date  . ABDOMINAL HYSTERECTOMY  1987   for fibroids  . AORTIC VALVE REPLACEMENT  2006  . CHOLECYSTECTOMY    . CORONARY ARTERY BYPASS GRAFT  2006   Saphenous vein to RCA at time of AVR. Course complicated by  acute respiratory failure, post-op PTX, ARI, ileus, CVA  . MASTECTOMY Left 1995   L for adenocarcinoma  . PACEMAKER IMPLANT N/A 02/09/2019   Procedure: PACEMAKER IMPLANT;  Surgeon: Audrey Haw, MD;  Location: [place] [name];  Service: Cardiovascular;  Laterality: N/A;  . RIGHT HEART CATH AND CORONARY/GRAFT ANGIOGRAPHY N/A 02/09/2019   Procedure: RIGHT HEART CATH AND CORONARY/GRAFT ANGIOGRAPHY;  Surgeon: Audrey Mocha, MD;  Location: [place] [name];  Service: Cardiovascular;  Laterality: N/A;  . TEE WITHOUT CARDIOVERSION N/A 07/20/2019   Procedure: TRANSESOPHAGEAL  ECHOCARDIOGRAM (TEE);  Surgeon: Audrey Pain, MD;  Location: [facility] ENDOSCOPY;  Service: Cardiovascular;  Laterality: N/A;  . TRANSCATHETER AORTIC VALVE REPLACEMENT, TRANSFEMORAL N/A 03/01/2019   Procedure: TRANSCATHETER AORTIC VALVE REPLACEMENT, TRANSFEMORAL;  Surgeon: Audrey Mocha, MD;  Location: [name];  Service: Open Heart Surgery;  Laterality: N/A;     Current Outpatient Medications  Medication Sig Dispense Refill  . acetaminophen (TYLENOL) 325 MG tablet Take 650 mg by mouth every 6 (six) hours as needed.    . Amino Acids-Protein Hydrolys (FEEDING SUPPLEMENT, PRO-STAT SUGAR FREE 64,) LIQD Take 30 mLs by mouth 2 (two) times daily with a meal.    . amLODipine (NORVASC) 5 MG tablet Take 1 tablet (5 mg total) by mouth daily. 90 tablet 1  . amoxicillin (AMOXIL) 500 MG capsule Take 2,000 mg by mouth as needed. Prior to dental procedure    . aspirin 81 MG EC tablet TAKE 1 TABLET (81 MG TOTAL) BY MOUTH DAILY. SWALLOW WHOLE. 30 tablet 3  . atorvastatin (LIPITOR) 80 MG tablet Take 1 tablet (80 mg total) by mouth at bedtime. 90 tablet 3  . cefTRIAXone 2 g in sodium chloride 0.9 % 100 mL Inject 2 g into the vein daily. Piggyback for endocarditis    . clopidogrel (PLAVIX) 75 MG tablet Take 1 tablet (75 mg total) by mouth daily with breakfast. 90 tablet 3  . diclofenac Sodium (VOLTAREN) 1 % GEL APPLY 2 GRAMS TOPICALLY 4 TIMES DAILY 100 g 3  . docusate sodium (COLACE) 100 MG capsule Take 100 mg by mouth 2 (two) times daily.    Marland Kitchen EASY TOUCH PEN NEEDLES 31G X 5 MM MISC USE TO INJECT INSULIN INTO THE SKIN    . furosemide (LASIX) 80 MG tablet Take 80 mg by mouth 2 (two) times daily.    . hydrALAZINE (APRESOLINE) 50 MG tablet Take 1 tablet (50 mg total) by mouth 3 (three) times daily. 270 tablet 0  . Insulin Degludec-Liraglutide (XULTOPHY) 100-3.6 UNIT-MG/ML SOPN Inject 14 Units into the skin daily.    . metoprolol succinate (TOPROL-XL) 25 MG 24 hr tablet Take 1 tablet (25 mg total) by mouth daily.  90 tablet 1  . PARoxetine (PAXIL) 30 MG tablet TAKE 1 TABLET BY MOUTH DAILY 30 tablet 11  . potassium chloride 20 MEQ TBCR Take 20 mEq by mouth daily. 30 tablet 6   No current facility-administered medications for this visit.    Allergies:   Other   Social History:  The patient  reports that she has never smoked. She has never used smokeless tobacco. She reports current alcohol use. She reports that she does not use drugs.   Family History:  The patient's family history includes Alzheimer's disease in her mother; Diabetes in her mother; Heart disease (age of onset: 36) in her sister; Heart disease (age of onset: 4) in her father; Hypertension in her mother; Kidney disease in her sister; Mental illness in her  sister.    ROS:  Please see the history of present illness.   Otherwise, review of systems is positive for none.   All other systems are reviewed and negative.   PHYSICAL EXAM: VS:  BP (!) 150/72   Pulse 63   Ht 5' 5.5" (1.664 m)   Wt 158 lb (71.7 kg)   BMI 25.89 kg/m  , BMI Body mass index is 25.89 kg/m. GEN: Well nourished, well developed, in no acute distress  HEENT: normal  Neck: no JVD, carotid bruits, or masses Cardiac: RRR; no murmurs, rubs, or gallops,no edema  Respiratory:  clear to auscultation bilaterally, normal work of breathing GI: soft, nontender, nondistended, + BS MS: no deformity or atrophy  Skin: warm and dry, device site well healed Neuro:  Strength and sensation are intact Psych: euthymic mood, full affect  EKG:  EKG is not ordered today. Personal review of the ekg ordered 08/23/19 shows sinus rhythm, left bundle branch block  Personal review of the device interrogation today. Results in [place]: 02/07/2019: TSH 1.630 07/17/2019: ALT 13; B Natriuretic Peptide 424.2 07/20/2019: Magnesium 1.8 08/08/2019: Hemoglobin 10.9; Platelets 299 08/23/2019: BUN 44; Creatinine, Ser 1.62; NT-Pro BNP 3,638; Potassium 4.4; Sodium 143    Lipid Panel      Component Value Date/Time   CHOL 99 02/07/2019 2012   CHOL 148 05/25/2015 1106   TRIG 117 02/07/2019 2012   HDL 26 (L) 02/07/2019 2012   HDL 41 05/25/2015 1106   CHOLHDL 3.8 02/07/2019 2012   VLDL 23 02/07/2019 2012   LDLCALC 50 02/07/2019 2012   LDLCALC 79 05/25/2015 1106     Wt Readings from Last 3 Encounters:  08/25/19 158 lb (71.7 kg)  08/24/19 168 lb 3.2 oz (76.3 kg)  08/23/19 162 lb 6.4 oz (73.7 kg)      Other studies Reviewed: Additional studies/ records that were reviewed today include: TTE 04/23/18  Review of the above records today demonstrates:  1. Left ventricular ejection fraction, by estimation, is 65 to 70%. The  left ventricle has hyperdynamic function. The left ventricle has no  regional wall motion abnormalities. There is severe concentric left  ventricular hypertrophy. Left ventricular  diastolic parameters are consistent with Grade I diastolic dysfunction  (impaired relaxation). Elevated left ventricular end-diastolic pressure.  2. Right ventricular systolic function is normal. The right ventricular  size is normal. There is normal pulmonary artery systolic pressure.  3. Left atrial size was moderately dilated.  4. Right atrial size was mildly dilated.  5. The mitral valve is degenerative. Trivial mitral valve regurgitation.  Moderate mitral stenosis. The mean mitral valve gradient is 9.0 mmHg.  6. The aortic valve has been repaired/replaced. There is a 23 mm  Medtronic CoreValve-Evolut Pro prosthetic (TAVR) valve present in the  aortic position. Procedure Date: 03/01/2019. Aortic valve area, by VTI  measures 1.21 cm. Aortic valve mean gradient  measures 12.0 mmHg. Trivial paravalvular regurgitation. No definite  valvular regurgitation.  7. The inferior vena cava is normal in size with greater than 50%  respiratory variability, suggesting right atrial pressure of 3 mmHg.    ASSESSMENT AND PLAN:  1.  Complete heart block: Status post  Medtronic dual-chamber pacemaker implanted December 2020.  Device functioning appropriately.  Unfortunately, she had sepsis with vegetation on her leads.  She was referred to [facility] and now has a Micra implanted.  Device is functioning appropriately.  No changes.  She is not having much in the way  of symptoms despite only atrial sensing 54% of the time.  If she does develop symptoms, she may benefit from reimplantation of a traditional pacemaker.  2.  Severe aortic stenosis: Status post TAVR January 2021.  No issues on most recent echo.  Continue to monitor.    3.  Hypertension: Elevated today.  She has not taken her medications yet this morning.  Is usually better controlled.  No changes.  3.  Coronary artery disease: Status post CABG x1 with SVG to the RCA in 2006.  No current chest [name].  Current medicines are reviewed at length with the patient today.   The patient does not have concerns regarding her medicines.  The following changes were made today: None  Labs/ tests ordered today include:  No orders of the defined types were placed in this encounter.    Disposition:   FU with Caliana Spires 6 months  Signed, Clarance Bollard Meredith Leeds, MD  08/25/2019 11:52 AM     [facility] [address] [place] [address] [phone] (office) [phone] (fax)

## 2019-08-30 ENCOUNTER — Telehealth: Payer: Self-pay | Admitting: *Deleted

## 2019-08-30 NOTE — Telephone Encounter (Signed)
Renada RN at [facility] was calling to ask about a stop date for the patient PICC. She was to stop her IV medication 09-02-19 but she was to have a visit 08-25-19 that was canceled. Advised her will have to ask the provider as the patient does not have a follow up visit scheduled.  Advised will ask the provider and give them a call back once she responds.

## 2019-08-31 NOTE — Telephone Encounter (Signed)
Yes. Still stick to the same end date of abtx and pull picc line thereafter. Make sure to ask them that she has follow up with cardiology, dr Curt Bears to evaluate her for new device

## 2019-08-31 NOTE — Telephone Encounter (Signed)
Per Dr Leonie Man called [facility] and left message that the patient pull date is still 09-03-19 and to keep follow up visits with cardiology. Left message for them to call back with any questions.

## 2019-09-09 ENCOUNTER — Other Ambulatory Visit: Payer: Self-pay | Admitting: Internal Medicine

## 2019-09-12 ENCOUNTER — Ambulatory Visit: Payer: Medicare Other | Admitting: Internal Medicine

## 2019-09-15 ENCOUNTER — Ambulatory Visit: Payer: Medicare Other | Admitting: Internal Medicine

## 2019-09-19 ENCOUNTER — Other Ambulatory Visit: Payer: Self-pay | Admitting: *Deleted

## 2019-09-19 DIAGNOSIS — I10 Essential (primary) hypertension: Secondary | ICD-10-CM

## 2019-09-19 MED ORDER — METOPROLOL SUCCINATE ER 25 MG PO TB24: 25.0000 mg | ORAL_TABLET | Freq: Every day | ORAL | 1 refills | Status: DC

## 2019-09-20 ENCOUNTER — Encounter: Payer: Self-pay | Admitting: Adult Health

## 2019-09-20 ENCOUNTER — Encounter: Payer: Self-pay | Admitting: Internal Medicine

## 2019-09-20 ENCOUNTER — Ambulatory Visit (INDEPENDENT_AMBULATORY_CARE_PROVIDER_SITE_OTHER): Payer: Medicare Other | Admitting: Internal Medicine

## 2019-09-20 ENCOUNTER — Other Ambulatory Visit: Payer: Self-pay

## 2019-09-20 ENCOUNTER — Non-Acute Institutional Stay (SKILLED_NURSING_FACILITY): Payer: Medicare Other | Admitting: Adult Health

## 2019-09-20 VITALS — BP 131/68 | HR 69 | Temp 98.1°F

## 2019-09-20 DIAGNOSIS — B951 Streptococcus, group B, as the cause of diseases classified elsewhere: Secondary | ICD-10-CM | POA: Diagnosis not present

## 2019-09-20 DIAGNOSIS — E1122 Type 2 diabetes mellitus with diabetic chronic kidney disease: Secondary | ICD-10-CM

## 2019-09-20 DIAGNOSIS — T827XXS Infection and inflammatory reaction due to other cardiac and vascular devices, implants and grafts, sequela: Secondary | ICD-10-CM

## 2019-09-20 DIAGNOSIS — I5032 Chronic diastolic (congestive) heart failure: Secondary | ICD-10-CM

## 2019-09-20 DIAGNOSIS — N1832 Chronic kidney disease, stage 3b: Secondary | ICD-10-CM

## 2019-09-20 DIAGNOSIS — I2510 Atherosclerotic heart disease of native coronary artery without angina pectoris: Secondary | ICD-10-CM | POA: Diagnosis not present

## 2019-09-20 DIAGNOSIS — E1121 Type 2 diabetes mellitus with diabetic nephropathy: Secondary | ICD-10-CM

## 2019-09-20 DIAGNOSIS — I10 Essential (primary) hypertension: Secondary | ICD-10-CM

## 2019-09-20 DIAGNOSIS — T827XXD Infection and inflammatory reaction due to other cardiac and vascular devices, implants and grafts, subsequent encounter: Secondary | ICD-10-CM

## 2019-09-20 DIAGNOSIS — R7881 Bacteremia: Secondary | ICD-10-CM

## 2019-09-20 DIAGNOSIS — Z794 Long term (current) use of insulin: Secondary | ICD-10-CM

## 2019-09-20 DIAGNOSIS — F339 Major depressive disorder, recurrent, unspecified: Secondary | ICD-10-CM

## 2019-09-20 LAB — BASIC METABOLIC PANEL
BUN: 65 — AB (ref 4–21)
CO2: 23 — AB (ref 13–22)
Chloride: 101 (ref 99–108)
Creatinine: 1.8 — AB (ref 0.5–1.1)
Glucose: 153
Potassium: 4.2 (ref 3.4–5.3)
Sodium: 138 (ref 137–147)

## 2019-09-20 LAB — CBC AND DIFFERENTIAL
HCT: 34 — AB (ref 36–46)
Hemoglobin: 11 — AB (ref 12.0–16.0)
Neutrophils Absolute: 5
Platelets: 246 (ref 150–399)
WBC: 8

## 2019-09-20 LAB — COMPREHENSIVE METABOLIC PANEL
Calcium: 8.6 — AB (ref 8.7–10.7)
GFR calc Af Amer: 32.28
GFR calc non Af Amer: 27.85

## 2019-09-20 LAB — CBC: RBC: 3.76 — AB (ref 3.87–5.11)

## 2019-09-20 NOTE — Progress Notes (Signed)
Location:  [facility] Room Number: 217-A Place of Service:  SNF (31) Provider:  Durenda Age, DNP, FNP-BC  Patient Care Team: Angelica Pou, MD as PCP - General (Internal Medicine) Dorothy Spark, MD as PCP - Cardiology (Cardiology) Constance Haw, MD as PCP - Electrophysiology (Cardiology) Chrismon, Amber as Social Worker Barrington Ellison, RN as Case Manager  Extended Emergency Contact Information Primary Emergency Contact: Lieutenant [place] Phone: [phone] Mobile Phone: [phone] Relation: Daughter Secondary Emergency Contact: Armstrong,[name]  [place] Phone: [phone] Relation: Daughter  Code Status:  Full Code  Goals of care: Advanced Directive information Advanced Directives 07/18/2019  Does Patient Have a Medical Advance Directive? No  Type of Advance Directive -  Does patient want to make changes to medical advance directive? -  Copy of [place] in Chart? -  Would patient like information on creating a medical advance directive? No - Guardian declined  Pre-existing out of facility DNR order (yellow form or pink MOST form) -     Chief Complaint  Patient presents with  . Medical Management of Chronic Issues    Routine [facility] visit  . Quality Metric Gaps    Diabetic foot and eye exams  . Best Practice Recommendations    Hepatitis C screening    HPI:  [name] is a 74 y.o. female seen today for medical management of chronic diseases.  She is a long-term care resident of [facility]. She has a PMH of essential hypertension, dyslipidemia, history of stroke, chronic kidney disease, CAD, severe aortic stenosis, history of breast cancer and history of chronic diastolic CHF. She was seen in the room today. She had a follow up consult today with ID, Dr. Carlyle Basques. She has history of pacemaker device infection. She  completed antibiotics. She is currently having [name], OT and ST. SBPs ranging from 118 to 154, with outlier 164.  She is currently taking amlodipine, metoprolol succinate ER and hydralazine for hypertension.   Past Medical History:  Diagnosis Date  . Acute on chronic diastolic CHF (congestive heart failure) ([place]) 06/22/2017  . Acute on chronic renal failure ([place]) 05/25/2011  . Adenocarcinoma of breast ([place]) 1996   Completed tamoxifen and had mastectomy.  . Aortic stenosis, severe    s/p aortic valve replacement with porcine valve 06/2004.  ECHO 2010 EF 37%, LVH, diastolic dysfxn, Bioprostetic aoritc valve, mild AS. ECHO 2013 EF 60%, Nl aortic artificial valve, dynamic obstruction in the outflow tract   Class IIb rec for annual TTE after 5 yrs. She had a TTE 2013    . Arthritis   . CAD (coronary artery disease) 2006   s/p CABG (5/06) w/ saphenous vein to RCA at time of AVR  . CKD (chronic kidney disease) stage 2, GFR 60-89 ml/min 06/03/2011   She is no longer taking NSAIDs.   Marland Kitchen CVA (cerebral infarction) 2006   Post-op from AVR. Presumed embolic in nature. Carotid stenosis of R 60-79%. Repeat dopplers 4/10 no R stenosis and L stenosis of 1-29%.  . Depression    Controlled on Paxil  . Diabetes mellitus 1992   Dx 04/25/1990. Now insulin dependent, started 2008. On ACEI.   . Diverticulosis 2001  . History of 2019 novel coronavirus disease (COVID-19)   . Hyperlipidemia    Mgmt with a statin  . Hypertension    Requires 4 drug tx  . Osteoporosis 2006   DEXA 10/06 :  L femur T -2.8, R -2.7. Lumbar T -2.4. On bisphosphonates and  Calcium / Vit D.  . Presence of permanent cardiac pacemaker   . Refusal of blood transfusions as patient is Jehovah's Witness   . Stroke [facility])    Past Surgical History:  Procedure Laterality Date  . ABDOMINAL HYSTERECTOMY  1987   for fibroids  . AORTIC VALVE REPLACEMENT  2006  . CHOLECYSTECTOMY    . CORONARY ARTERY BYPASS GRAFT  2006   Saphenous vein to RCA at time of  AVR. Course complicated by acute respiratory failure, post-op PTX, ARI, ileus, CVA  . MASTECTOMY Left 1995   L for adenocarcinoma  . PACEMAKER IMPLANT N/A 02/09/2019   Procedure: PACEMAKER IMPLANT;  Surgeon: Constance Haw, MD;  Location: [facility] CV LAB;  Service: Cardiovascular;  Laterality: N/A;  . RIGHT HEART CATH AND CORONARY/GRAFT ANGIOGRAPHY N/A 02/09/2019   Procedure: RIGHT HEART CATH AND CORONARY/GRAFT ANGIOGRAPHY;  Surgeon: Sherren Mocha, MD;  Location: [facility] LAB;  Service: Cardiovascular;  Laterality: N/A;  . TEE WITHOUT CARDIOVERSION N/A 07/20/2019   Procedure: TRANSESOPHAGEAL ECHOCARDIOGRAM (TEE);  Surgeon: Jerline Pain, MD;  Location: [facility] ENDOSCOPY;  Service: Cardiovascular;  Laterality: N/A;  . TRANSCATHETER AORTIC VALVE REPLACEMENT, TRANSFEMORAL N/A 03/01/2019   Procedure: TRANSCATHETER AORTIC VALVE REPLACEMENT, TRANSFEMORAL;  Surgeon: Sherren Mocha, MD;  Location: [facility] LAB;  Service: Open Heart Surgery;  Laterality: N/A;    Allergies  Allergen Reactions  . 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," as the patient is a Jehovah's Witness    Outpatient Encounter Medications as of 09/20/2019  Medication Sig  . acetaminophen (TYLENOL) 325 MG tablet Take 650 mg by mouth every 6 (six) hours as needed.  Marland Kitchen amLODipine (NORVASC) 5 MG tablet Take 1 tablet (5 mg total) by mouth daily.  Marland Kitchen amoxicillin (AMOXIL) 500 MG capsule Take 2,000 mg by mouth as needed. Prior to dental procedure  . aspirin 81 MG EC tablet TAKE 1 TABLET (81 MG TOTAL) BY MOUTH DAILY. SWALLOW WHOLE.  Marland Kitchen atorvastatin (LIPITOR) 80 MG tablet Take 1 tablet (80 mg total) by mouth at bedtime.  . clopidogrel (PLAVIX) 75 MG tablet Take 1 tablet (75 mg total) by mouth daily with breakfast.  . diclofenac Sodium (VOLTAREN) 1 % GEL APPLY 2 GRAMS TOPICALLY 4 TIMES DAILY  . furosemide (LASIX) 80 MG tablet Take 80 mg by mouth 2 (two) times daily.  . hydrALAZINE (APRESOLINE) 50 MG tablet Take 1  tablet (50 mg total) by mouth 3 (three) times daily.  . Insulin Degludec-Liraglutide (XULTOPHY) 100-3.6 UNIT-MG/ML SOPN Inject 14 Units into the skin daily.  Marland Kitchen lactulose (CHRONULAC) 10 GM/15ML solution Take 10 g by mouth daily.   . metoprolol succinate (TOPROL-XL) 25 MG 24 hr tablet Take 1 tablet (25 mg total) by mouth daily.  Marland Kitchen PARoxetine (PAXIL) 30 MG tablet TAKE 1 TABLET BY MOUTH DAILY  . potassium chloride 20 MEQ TBCR Take 20 mEq by mouth daily.  Marland Kitchen EASY TOUCH PEN NEEDLES 31G X 5 MM MISC USE TO INJECT INSULIN INTO THE SKIN (Patient not taking: Reported on 09/20/2019)  . [DISCONTINUED] Amino Acids-Protein Hydrolys (FEEDING SUPPLEMENT, PRO-STAT SUGAR FREE 64,) LIQD Take 30 mLs by mouth 2 (two) times daily with a meal.  . [DISCONTINUED] cefTRIAXone 2 g in sodium chloride 0.9 % 100 mL Inject 2 g into the vein daily. Piggyback for endocarditis  . [DISCONTINUED] docusate sodium (COLACE) 100 MG capsule Take 100 mg by mouth 2 (two) times  daily.   . [DISCONTINUED] XULTOPHY 100-3.6 UNIT-MG/ML SOPN INJECT 10 UNITS UNDER THE SKIN DAILY.   No facility-administered encounter medications on file as of 09/20/2019.    Review of Systems  GENERAL: No change in appetite, no fatigue, no weight changes, no fever, chills or weakness MOUTH and THROAT: Denies oral discomfort, gingival pain or bleeding RESPIRATORY: no cough, SOB, DOE, wheezing, hemoptysis CARDIAC: No chest pain or palpitations GI: No abdominal pain, diarrhea, constipation, heart burn, nausea or vomiting GU: Denies dysuria, frequency, hematuria, incontinence, or discharge NEUROLOGICAL: Denies dizziness, syncope, numbness, or headache PSYCHIATRIC: Denies feelings of depression or anxiety. No report of hallucinations, insomnia, paranoia, or agitation   Immunization History  Administered Date(s) Administered  . Fluad Quad(high Dose 65+) 02/11/2019  . Influenza Split 11/12/2010, 11/10/2011  . Influenza Whole 11/07/2009  . Influenza,inj,Quad PF,6+  Mos 10/19/2012, 01/05/2014, 11/08/2015, 01/29/2017  . Moderna SARS-COVID-2 Vaccination 03/09/2019, 04/06/2019  . Pneumococcal Conjugate-13 06/07/2014  . Pneumococcal Polysaccharide-23 12/26/1994, 03/05/2010  . Td 09/24/1993  . Tdap 11/12/2010, 07/12/2013   Pertinent  Health Maintenance Due  Topic Date Due  . OPHTHALMOLOGY EXAM  11/07/2016  . FOOT EXAM  07/10/2017  . INFLUENZA VACCINE  09/25/2019  . HEMOGLOBIN A1C  10/14/2019  . LIPID PANEL  02/07/2020  . DEXA SCAN  Completed  . PNA vac Low Risk Adult  Completed   Fall Risk  08/02/2019 06/16/2019 05/19/2019 05/04/2019 04/27/2019  Falls in the past year? 1 1 1  0 0  Comment - [name] denies-but dtr stated [name] had a fall in last mths [name] denies falls-but dtr states [name] has had a fall - -  Number falls in past yr: 1 0 0 - 0  Injury with Fall? 0 0 0 - 0  Risk Factor Category  - - - - -  Risk for fall due to : Impaired mobility History of fall(s);Impaired balance/gait;Impaired mobility;Medication side effect History of fall(s);Impaired balance/gait;Impaired mobility;Medication side effect No Fall Risks -  Risk for fall due to: Comment - - - - -  Follow up Falls evaluation completed Falls evaluation completed;Falls prevention discussed Falls evaluation completed Falls evaluation completed -     Vitals:   09/20/19 1523  BP: (!) 120/61  Pulse: 60  Resp: 17  Temp: (!) 97.5 F (36.4 C)  TempSrc: Oral  Weight: 165 lb 9.6 oz (75.1 kg)  Height: 5\' 5"  (1.651 m)   Body mass index is 27.56 kg/m.  Physical Exam  GENERAL APPEARANCE: Well nourished. In no acute distress.  SKIN:  Skin is warm and dry.  MOUTH and THROAT: Lips are without lesions. Oral mucosa is moist and without lesions.  RESPIRATORY: Breathing is even & unlabored, BS CTAB CARDIAC: RRR, no murmur,no extra heart sounds, BLE 1+ leg edema daily  edema GI: Abdomen soft, normal BS, no masses, no tenderness NEUROLOGICAL: There is no tremor. Speech is clear. Alert and oriented X  3. PSYCHIATRIC:  Affect and behavior are appropriate  Labs reviewed: Recent Labs    02/07/19 1323 02/07/19 1527 02/08/19 0556 02/09/19 0508 03/02/19 0253 05/04/19 1144 07/20/19 0301 07/20/19 0301 07/21/19 0356 08/02/19 0000 08/11/19 1638 08/11/19 1638 08/16/19 0000 08/23/19 1701 09/20/19 0000  NA 143   < > 145   < > 140   < > 137   < > 139   < > 139   < > 141 143 138  K 3.3*   < > 4.1   < > 3.7   < > 3.6   < >  3.6   < > 4.9   < > 4.1 4.4 4.2  CL 110   < > 109   < > 105   < > 98   < > 102   < > 98   < > 104 102 101  CO2 23   < > 27   < > 26   < > 27   < > 29   < > 27   < > 27* 25 23*  GLUCOSE 203*   < > 143*   < > 115*   < > 239*   < > 209*  --  171*  --   --  121*  --   BUN 25*   < > 26*   < > 24*   < > 26*   < > 20   < > 45*   < > 59* 44* 65*  CREATININE 1.48*   < > 1.58*   < > 1.47*   < > 1.56*   < > 1.35*   < > 1.70*  --  1.6* 1.62* 1.8*  CALCIUM 8.2*   < > 9.0   < > 8.8*   < > 8.6*   < > 8.5*   < > 9.0   < > 8.9 9.4 8.6*  MG 1.7   < > 2.3  --  1.7  --  1.8  --   --   --   --   --   --   --   --   PHOS 3.6  --   --   --   --   --   --   --   --   --   --   --   --   --   --    < > = values in this interval not displayed.   Recent Labs    02/07/19 1323 02/24/19 1020 07/17/19 2150  AST 13* 16 17  ALT $Re'12 13 13  'xGW$ ALKPHOS 96 125 141*  BILITOT 1.0 0.6 0.8  PROT 6.5 7.8 7.6  ALBUMIN 3.1* 3.4* 3.4*   Recent Labs    07/17/19 2150 07/17/19 2150 07/19/19 0219 07/19/19 0219 07/20/19 0723 07/20/19 0723 08/02/19 0000 08/08/19 0000 09/20/19 0000  WBC 18.9*   < > 16.1*   < > 9.2  --  9.1 7.7 8.0  NEUTROABS 17.3*  --   --   --   --   --  $R'6 5 5  'UU$ HGB 12.8   < > 10.3*   < > 10.3*   < > 9.5* 10.9* 11.0*  HCT 40.0   < > 32.2*   < > 32.4*   < > 29* 33* 34*  MCV 94.8  --  93.9  --  93.9  --   --   --   --   PLT 256   < > 184   < > 193   < > 314 299 246   < > = values in this interval not displayed.   Lab Results  Component Value Date   TSH 1.630 02/07/2019   Lab Results   Component Value Date   HGBA1C 9 07/14/2019   Lab Results  Component Value Date   CHOL 99 02/07/2019   HDL 26 (L) 02/07/2019   LDLCALC 50 02/07/2019   TRIG 117 02/07/2019   CHOLHDL 3.8 02/07/2019    Significant Diagnostic Results in last 30 days:  [place]  Result Date: 08/24/2019 Leadless PPM wound check in clinic, added-on per Dr. Meda Coffee. Gauze dressing and 1 remaining steri-strip removed from right chest incision (from prior PPM explant). Incision edges appear fully approximated and well healed. Instructions provided for SNF to  keep incision OTA and cleanse site daily when bathing. Normal leadless PPM function. Impedance and threshold tests stable. Longevity >8 years. VS <0.1%, AM-VP 58.2%, VP 41.8%. Patient to bring home monitor to upcoming appointment for assistance with setup. ROV with Dr. Curt Bears on 08/25/19.   Assessment/Plan  1. Chronic diastolic congestive heart failure (HCC) - No SOB, continue furosemide and metoprolol succinate ER  2. Essential hypertension -Stable, continue amlodipine, metoprolol succinate ER and hydralazine  3. Type 2 diabetes mellitus with stage 3b chronic kidney disease, with long-term current use of insulin [facility]) Lab Results  Component Value Date   HGBA1C 9 07/14/2019   -Continue Xultophy  4. Coronary artery disease involving native coronary artery of native heart without angina pectoris -Denies chest pain, continue Plavix, aspirin and atorvastatin  5. Infection of pacemaker lead wire, subsequent encounter -Completed antibiotics, and had last appointment with ID, no follow up needed according to report  6. Major depression, recurrent, chronic (HCC) -Mood is stable, continue paroxetine     Family/ staff Communication: Discussed plan of care with resident and charge nurse.  Labs/tests ordered: Hep C antibody  Goals of care:   Short-term care   Durenda Age, DNP, FNP-BC [facility]  [facility] [phone] (Monday-Friday 8:00 a.m. - 5:00 p.m.) [phone] (after hours)

## 2019-09-20 NOTE — Progress Notes (Signed)
Patient ID: [name], female   DOB: 1945/10/03, 74 y.o.   MRN: [id number]  HPI [name] is a 74yo F with hx of TAVR implant 03/01/2019 and also hx of complete heart block with pacemaker in dec 2020 but was hospitalized in may 2021 for streptococcal sepsis and found to have pacemaker lead vegetation. She finished iv abtx on 09/01/19.She was sent to [facility] for explant and is now status post Medtronic Micra implant. She states that she has some itching to her  scar to the new implant on right sided upper chest wall. Doing well.  Has had covid 19 vaccine - had both doses of moderna while at the [facility] in [place].    Outpatient Encounter Medications as of 09/20/2019  Medication Sig  . acetaminophen (TYLENOL) 325 MG tablet Take 650 mg by mouth every 6 (six) hours as needed.  . Amino Acids-Protein Hydrolys (FEEDING SUPPLEMENT, PRO-STAT SUGAR FREE 64,) LIQD Take 30 mLs by mouth 2 (two) times daily with a meal.  . amLODipine (NORVASC) 5 MG tablet Take 1 tablet (5 mg total) by mouth daily.  Marland Kitchen amoxicillin (AMOXIL) 500 MG capsule Take 2,000 mg by mouth as needed. Prior to dental procedure  . aspirin 81 MG EC tablet TAKE 1 TABLET (81 MG TOTAL) BY MOUTH DAILY. SWALLOW WHOLE.  Marland Kitchen atorvastatin (LIPITOR) 80 MG tablet Take 1 tablet (80 mg total) by mouth at bedtime.  . clopidogrel (PLAVIX) 75 MG tablet Take 1 tablet (75 mg total) by mouth daily with breakfast.  . diclofenac Sodium (VOLTAREN) 1 % GEL APPLY 2 GRAMS TOPICALLY 4 TIMES DAILY  . EASY TOUCH PEN NEEDLES 31G X 5 MM MISC USE TO INJECT INSULIN INTO THE SKIN  . furosemide (LASIX) 80 MG tablet Take 80 mg by mouth 2 (two) times daily.  . hydrALAZINE (APRESOLINE) 50 MG tablet Take 1 tablet (50 mg total) by mouth 3 (three) times daily.  Marland Kitchen lactulose (CHRONULAC) 10 GM/15ML solution Take 10 g by mouth 3 (three) times daily.  . metoprolol succinate (TOPROL-XL) 25 MG 24 hr tablet Take 1 tablet (25 mg total) by mouth daily.  Marland Kitchen  PARoxetine (PAXIL) 30 MG tablet TAKE 1 TABLET BY MOUTH DAILY  . potassium chloride 20 MEQ TBCR Take 20 mEq by mouth daily.  Claris Che 100-3.6 UNIT-MG/ML SOPN INJECT 10 UNITS UNDER THE SKIN DAILY.  . cefTRIAXone 2 g in sodium chloride 0.9 % 100 mL Inject 2 g into the vein daily. Piggyback for endocarditis  . docusate sodium (COLACE) 100 MG capsule Take 100 mg by mouth 2 (two) times daily. (Patient not taking: Reported on 09/20/2019)   No facility-administered encounter medications on file as of 09/20/2019.     Patient Active Problem List   Diagnosis Date Noted  . Neurocognitive deficits 08/04/2019  . Infection of pacemaker lead wire ([place]) 07/21/2019  . Fever   . History of transcatheter aortic valve replacement (TAVR)   . Septic encephalopathy 07/18/2019  . Sacral decubitus ulcer, stage II ([place]) 07/18/2019  . Bacteremia due to group B Streptococcus 07/18/2019  . Sepsis ([place]) 07/18/2019  . Complete heart block ([place])   . Chronic diastolic congestive heart failure ([place]) 02/07/2019  . AKI (acute kidney injury) ([place]) 07/22/2017  . Peripheral arterial disease ([place]) 06/18/2017  . B12 deficiency 05/01/2017  . Bilateral lower extremity edema 11/08/2015  . Aortic atherosclerosis ([place]) 06/07/2014  . Abnormality of gait 05/29/2012  . Constipation 03/16/2012  . HOCM (hypertrophic obstructive cardiomyopathy) ([place]) 06/11/2011  .  Routine health maintenance 06/10/2010  . Hyperlipidemia   . Refusal of blood transfusions as patient is Jehovah's Witness   . History of adenocarcinoma of breast   . Osteoporosis   . Status post CVA   . Aortic stenosis s/p Tissure AVR 2006   . CAD (coronary artery disease)   . Hypertension   . Depression 01/23/2006  . DM (diabetes mellitus) ([place]) 03/25/1990  . Benign hypertensive heart and kidney disease with diastolic CHF, NYHA class 3 and CKD stage 3 ([place]) 1992  . Bradycardia 1992     Health Maintenance Due  Topic Date Due  . Hepatitis C Screening  Never done    . COVID-19 Vaccine (1) Never done  . OPHTHALMOLOGY EXAM  11/07/2016  . FOOT EXAM  07/10/2017  . HEMOGLOBIN A1C  08/19/2019     Review of Systems Review of Systems  Constitutional: Negative for fever, chills, diaphoresis, activity change, appetite change, fatigue and unexpected weight change.  HENT: Negative for congestion, sore throat, rhinorrhea, sneezing, trouble swallowing and sinus pressure.  Eyes: Negative for photophobia and visual disturbance.  Respiratory: Negative for cough, chest tightness, shortness of breath, wheezing and stridor.  Cardiovascular: Negative for chest pain, palpitations and leg swelling.  Gastrointestinal: Negative for nausea, vomiting, abdominal pain, diarrhea, constipation, blood in stool, abdominal distention and anal bleeding.  Genitourinary: Negative for dysuria, hematuria, flank pain and difficulty urinating.  Musculoskeletal: Negative for myalgias, back pain, joint swelling, arthralgias and gait problem.  Skin: Negative for color change, pallor, rash and wound.  Neurological: Negative for dizziness, tremors, weakness and light-headedness.  Hematological: Negative for adenopathy. Does not bruise/bleed easily.  Psychiatric/Behavioral: Negative for behavioral problems, confusion, sleep disturbance, dysphoric mood, decreased concentration and agitation.    Physical Exam   BP (!) 131/68   Pulse 69   Temp 98.1 F (36.7 C)   SpO2 98%   Physical Exam  Constitutional:  oriented to person, place, and time. appears well-developed and well-nourished. No distress.  HENT: [place]/AT, PERRLA, no scleral icterus Mouth/Throat: Oropharynx is clear and moist. No oropharyngeal exudate.  Cardiovascular: Normal rate, regular rhythm and normal heart sounds. Exam reveals no gallop and no friction rub.  No murmur heard.  Chest wall: right upper chest wall, incision has keloid changes Lymphadenopathy: no cervical adenopathy. No axillary adenopathy Neurological: alert and  oriented to person, place, and time.  Skin: Skin is warm and dry. No rash noted. No erythema.  Psychiatric: a normal mood and affect.  behavior is normal.   CBC Lab Results  Component Value Date   WBC 7.7 08/08/2019   RBC 3.62 (A) 08/08/2019   HGB 10.9 (A) 08/08/2019   HCT 33 (A) 08/08/2019   PLT 299 08/08/2019   MCV 93.9 07/20/2019   MCH 29.9 07/20/2019   MCHC 31.8 07/20/2019   RDW 13.7 07/20/2019   LYMPHSABS 0.6 (L) 07/17/2019   MONOABS 0.8 07/17/2019   EOSABS 0.1 07/17/2019    BMET Lab Results  Component Value Date   NA 143 08/23/2019   K 4.4 08/23/2019   CL 102 08/23/2019   CO2 25 08/23/2019   GLUCOSE 121 (H) 08/23/2019   BUN 44 (H) 08/23/2019   CREATININE 1.62 (H) 08/23/2019   CALCIUM 9.4 08/23/2019   GFRNONAA 31 (L) 08/23/2019   GFRAA 35 (L) 08/23/2019    Assessment and Plan  Hx of pacermaker device infection = she has completed abtx. No longer needs any suppressive antibiotics.  rtc if needed

## 2019-09-30 ENCOUNTER — Other Ambulatory Visit: Payer: Self-pay | Admitting: Cardiology

## 2019-09-30 DIAGNOSIS — E7800 Pure hypercholesterolemia, unspecified: Secondary | ICD-10-CM

## 2019-09-30 DIAGNOSIS — I10 Essential (primary) hypertension: Secondary | ICD-10-CM

## 2019-09-30 DIAGNOSIS — I130 Hypertensive heart and chronic kidney disease with heart failure and stage 1 through stage 4 chronic kidney disease, or unspecified chronic kidney disease: Secondary | ICD-10-CM

## 2019-09-30 DIAGNOSIS — Z952 Presence of prosthetic heart valve: Secondary | ICD-10-CM

## 2019-09-30 DIAGNOSIS — Z950 Presence of cardiac pacemaker: Secondary | ICD-10-CM

## 2019-09-30 DIAGNOSIS — N189 Chronic kidney disease, unspecified: Secondary | ICD-10-CM

## 2019-09-30 DIAGNOSIS — I5032 Chronic diastolic (congestive) heart failure: Secondary | ICD-10-CM

## 2019-09-30 DIAGNOSIS — I2510 Atherosclerotic heart disease of native coronary artery without angina pectoris: Secondary | ICD-10-CM

## 2019-10-05 ENCOUNTER — Ambulatory Visit (INDEPENDENT_AMBULATORY_CARE_PROVIDER_SITE_OTHER): Payer: Medicare Other | Admitting: Physician Assistant

## 2019-10-05 ENCOUNTER — Encounter: Payer: Self-pay | Admitting: Physician Assistant

## 2019-10-05 ENCOUNTER — Other Ambulatory Visit: Payer: Self-pay

## 2019-10-05 VITALS — BP 133/65 | HR 70 | Temp 97.1°F | Ht 66.0 in | Wt 163.2 lb

## 2019-10-05 DIAGNOSIS — N183 Chronic kidney disease, stage 3 unspecified: Secondary | ICD-10-CM

## 2019-10-05 DIAGNOSIS — I2510 Atherosclerotic heart disease of native coronary artery without angina pectoris: Secondary | ICD-10-CM | POA: Diagnosis not present

## 2019-10-05 DIAGNOSIS — Z952 Presence of prosthetic heart valve: Secondary | ICD-10-CM

## 2019-10-05 DIAGNOSIS — I5030 Unspecified diastolic (congestive) heart failure: Secondary | ICD-10-CM

## 2019-10-05 DIAGNOSIS — Z950 Presence of cardiac pacemaker: Secondary | ICD-10-CM

## 2019-10-05 DIAGNOSIS — E785 Hyperlipidemia, unspecified: Secondary | ICD-10-CM

## 2019-10-05 DIAGNOSIS — I5032 Chronic diastolic (congestive) heart failure: Secondary | ICD-10-CM

## 2019-10-05 DIAGNOSIS — I130 Hypertensive heart and chronic kidney disease with heart failure and stage 1 through stage 4 chronic kidney disease, or unspecified chronic kidney disease: Secondary | ICD-10-CM

## 2019-10-05 DIAGNOSIS — E119 Type 2 diabetes mellitus without complications: Secondary | ICD-10-CM

## 2019-10-05 DIAGNOSIS — I10 Essential (primary) hypertension: Secondary | ICD-10-CM

## 2019-10-05 DIAGNOSIS — I38 Endocarditis, valve unspecified: Secondary | ICD-10-CM | POA: Diagnosis not present
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
## 2019-10-05 NOTE — Patient Instructions (Signed)
Medication Instructions:  Your physician recommends that you continue on your current medications as directed. Please refer to the Current Medication list given to you today.  *If you need a refill on your cardiac medications before your next appointment, please call your pharmacy*  Lab Work: NONE ordered at this time of appointment   If you have labs (blood work) drawn today and your tests are completely normal, you will receive your results only by: Marland Kitchen MyChart Message (if you have MyChart) OR . A paper copy in the mail If you have any lab test that is abnormal or we need to change your treatment, we will call you to review the results.  Testing/Procedures: NONE ordered at this time of appointment   Follow-Up: At [facility], you and your health needs are our priority.  As part of our continuing mission to provide you with exceptional heart care, we have created designated Provider Care Teams.  These Care Teams include your primary Cardiologist (physician) and Advanced Practice Providers (APPs -  Physician Assistants and Nurse Practitioners) who all work together to provide you with the care you need, when you need it.  We recommend signing up for the patient portal called "MyChart".  Sign up information is provided on this After Visit Summary.  MyChart is used to connect with patients for Virtual Visits (Telemedicine).  Patients are able to view lab/test results, encounter notes, upcoming appointments, etc.  Non-urgent messages can be sent to your provider as well.   To learn more about what you can do with MyChart, go to [email or web address].    Your next appointment:   3 month(s)  The format for your next appointment:   In Person  Provider:   [name], MD  Other Instructions

## 2019-10-07 ENCOUNTER — Encounter: Payer: Self-pay | Admitting: Physician Assistant

## 2019-10-10 ENCOUNTER — Other Ambulatory Visit: Payer: Self-pay

## 2019-10-10 MED ORDER — AMLODIPINE BESYLATE 5 MG PO TABS: 5.0000 mg | ORAL_TABLET | Freq: Every day | ORAL | 3 refills | Status: DC

## 2019-10-11 ENCOUNTER — Encounter: Payer: Self-pay | Admitting: Adult Health

## 2019-10-11 ENCOUNTER — Non-Acute Institutional Stay (SKILLED_NURSING_FACILITY): Payer: Medicare Other | Admitting: Adult Health

## 2019-10-11 DIAGNOSIS — I2510 Atherosclerotic heart disease of native coronary artery without angina pectoris: Secondary | ICD-10-CM

## 2019-10-11 DIAGNOSIS — E1121 Type 2 diabetes mellitus with diabetic nephropathy: Secondary | ICD-10-CM | POA: Diagnosis not present

## 2019-10-11 DIAGNOSIS — I5032 Chronic diastolic (congestive) heart failure: Secondary | ICD-10-CM | POA: Diagnosis not present

## 2019-10-11 DIAGNOSIS — K5901 Slow transit constipation: Secondary | ICD-10-CM

## 2019-10-11 DIAGNOSIS — Z794 Long term (current) use of insulin: Secondary | ICD-10-CM

## 2019-10-11 DIAGNOSIS — I10 Essential (primary) hypertension: Secondary | ICD-10-CM

## 2019-10-11 DIAGNOSIS — E1122 Type 2 diabetes mellitus with diabetic chronic kidney disease: Secondary | ICD-10-CM

## 2019-10-11 DIAGNOSIS — F339 Major depressive disorder, recurrent, unspecified: Secondary | ICD-10-CM

## 2019-10-11 DIAGNOSIS — N1832 Chronic kidney disease, stage 3b: Secondary | ICD-10-CM

## 2019-10-11 NOTE — Progress Notes (Addendum)
Location:  [facility] Room Number: 217-A Place of Service:  SNF (31) Provider:  Durenda Age, DNP, FNP-BC  Patient Care Team: Hendricks Limes, MD as PCP - General (Internal Medicine) Dorothy Spark, MD as PCP - Cardiology (Cardiology) Constance Haw, MD as PCP - Electrophysiology (Cardiology) Chrismon, Amber as Social Worker Barrington Ellison, RN as Case Manager Medina-Vargas, Senaida Lange, NP as Nurse Practitioner (Internal Medicine) Rehab, [facility] ([place])  Extended Emergency Contact Information Primary Emergency Contact: Lieutenant [place] of [place] Phone: [phone] Mobile Phone: [phone] Relation: Daughter Secondary Emergency Contact: Armstrong,[name]  [place] Phone: [phone] Relation: Daughter  Code Status:  FULL CODE  Goals of care: Advanced Directive information Advanced Directives 07/18/2019  Does Patient Have a Medical Advance Directive? No  Type of Advance Directive -  Does patient want to make changes to medical advance directive? -  Copy of [place] in Chart? -  Would patient like information on creating a medical advance directive? No - Guardian declined  Pre-existing out of facility DNR order (yellow form or pink MOST form) -     Chief Complaint  Patient presents with  . Medical Management of Chronic Issues    Patient is seen for a routine [facility] SNF visit  . Quality Metric Gaps    Patient needs diabetic foot and eye exams.    HPI:  Pt is a 74 y.o. female seen today for medical management of chronic diseases.  She is a long-term care resident of [facility]. She has a PMH of essential hypertension, dyslipidemia, history of stroke, CKD, CAD, severe aortic stenosis, history of breast cancer and history of chronic diastolic CHF. She was seen in the room today. She complained of constipation. She  stated that she already had her Lactulose earlier and if she can "have another dose of it." She denies having abdominal pain. No SOB reported. She is currently taking Furosemide 80 mg BID and Metoprolol succinate ER 25 mg daily for chronic diastolic heart failure.    Past Medical History:  Diagnosis Date  . Acute on chronic diastolic CHF (congestive heart failure) ([place]) 06/22/2017  . Acute on chronic renal failure ([place]) 05/25/2011  . Adenocarcinoma of breast ([place]) 1996   Completed tamoxifen and had mastectomy.  . Aortic stenosis, severe    s/p aortic valve replacement with porcine valve 06/2004.  ECHO 2010 EF 50%, LVH, diastolic dysfxn, Bioprostetic aoritc valve, mild AS. ECHO 2013 EF 60%, Nl aortic artificial valve, dynamic obstruction in the outflow tract   Class IIb rec for annual TTE after 5 yrs. She had a TTE 2013    . Arthritis   . CAD (coronary artery disease) 2006   s/p CABG (5/06) w/ saphenous vein to RCA at time of AVR  . CKD (chronic kidney disease) stage 2, GFR 60-89 ml/min 06/03/2011   She is no longer taking NSAIDs.   Marland Kitchen CVA (cerebral infarction) 2006   Post-op from AVR. Presumed embolic in nature. Carotid stenosis of R 60-79%. Repeat dopplers 4/10 no R stenosis and L stenosis of 1-29%.  . Depression    Controlled on Paxil  . Diabetes mellitus 1992   Dx 04/25/1990. Now insulin dependent, started 2008. On ACEI.   . Diverticulosis 2001  . History of 2019 novel coronavirus disease (COVID-19)   . Hyperlipidemia    Mgmt with a statin  . Hypertension    Requires 4 drug  tx  . Osteoporosis 2006   DEXA 10/06 : L femur T -2.8, R -2.7. Lumbar T -2.4. On bisphosphonates and  Calcium / Vit D.  . Presence of permanent cardiac pacemaker   . Refusal of blood transfusions as patient is Jehovah's Witness   . Stroke [facility])    Past Surgical History:  Procedure Laterality Date  . ABDOMINAL HYSTERECTOMY  1987   for fibroids  . AORTIC VALVE REPLACEMENT  2006  . CHOLECYSTECTOMY    . CORONARY  ARTERY BYPASS GRAFT  2006   Saphenous vein to RCA at time of AVR. Course complicated by acute respiratory failure, post-op PTX, ARI, ileus, CVA  . MASTECTOMY Left 1995   L for adenocarcinoma  . PACEMAKER IMPLANT N/A 02/09/2019   Procedure: PACEMAKER IMPLANT;  Surgeon: Constance Haw, MD;  Location: [place] CV LAB;  Service: Cardiovascular;  Laterality: N/A;  . RIGHT HEART CATH AND CORONARY/GRAFT ANGIOGRAPHY N/A 02/09/2019   Procedure: RIGHT HEART CATH AND CORONARY/GRAFT ANGIOGRAPHY;  Surgeon: Sherren Mocha, MD;  Location: [place] CV LAB;  Service: Cardiovascular;  Laterality: N/A;  . TEE WITHOUT CARDIOVERSION N/A 07/20/2019   Procedure: TRANSESOPHAGEAL ECHOCARDIOGRAM (TEE);  Surgeon: Jerline Pain, MD;  Location: [facility] ENDOSCOPY;  Service: Cardiovascular;  Laterality: N/A;  . TRANSCATHETER AORTIC VALVE REPLACEMENT, TRANSFEMORAL N/A 03/01/2019   Procedure: TRANSCATHETER AORTIC VALVE REPLACEMENT, TRANSFEMORAL;  Surgeon: Sherren Mocha, MD;  Location: [place] CV LAB;  Service: Open Heart Surgery;  Laterality: N/A;    Allergies  Allergen Reactions  . 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," as the patient is a Jehovah's Witness    Outpatient Encounter Medications as of 10/11/2019  Medication Sig  . acetaminophen (TYLENOL) 325 MG tablet Take 650 mg by mouth in the morning.   Marland Kitchen amLODipine (NORVASC) 5 MG tablet Take 1 tablet (5 mg total) by mouth daily.  Marland Kitchen amoxicillin (AMOXIL) 500 MG capsule Take 2,000 mg by mouth as needed. Prior to dental procedure  . aspirin 81 MG EC tablet TAKE 1 TABLET (81 MG TOTAL) BY MOUTH DAILY. SWALLOW WHOLE.  Marland Kitchen atorvastatin (LIPITOR) 80 MG tablet Take 1 tablet (80 mg total) by mouth at bedtime.  . clopidogrel (PLAVIX) 75 MG tablet Take 1 tablet (75 mg total) by mouth daily with breakfast.  . diclofenac Sodium (VOLTAREN) 1 % GEL APPLY 2 GRAMS TOPICALLY 4 TIMES DAILY  . furosemide (LASIX) 80 MG tablet Take 80 mg by mouth 2 (two) times daily.  .  hydrALAZINE (APRESOLINE) 50 MG tablet Take 1 tablet (50 mg total) by mouth 3 (three) times daily.  . Insulin Degludec-Liraglutide (XULTOPHY) 100-3.6 UNIT-MG/ML SOPN Inject 14 Units into the skin daily.  Marland Kitchen lactulose (CHRONULAC) 10 GM/15ML solution Take 10 g by mouth daily.   . metoprolol succinate (TOPROL-XL) 25 MG 24 hr tablet Take 1 tablet (25 mg total) by mouth daily.  Marland Kitchen PARoxetine (PAXIL) 30 MG tablet TAKE 1 TABLET BY MOUTH DAILY  . potassium chloride 20 MEQ TBCR Take 20 mEq by mouth daily.  Marland Kitchen EASY TOUCH PEN NEEDLES 31G X 5 MM MISC USE TO INJECT INSULIN INTO THE SKIN (Patient not taking: Reported on 10/11/2019)  . [DISCONTINUED] cefTRIAXone (ROCEPHIN) 2-2.22 GM-%(50ML) IVPB Inject 2 g into the vein daily.   No facility-administered encounter medications on file as of 10/11/2019.    Review of Systems  GENERAL: No change in appetite, no fatigue, no weight changes, no fever, chills or weakness MOUTH and THROAT: Denies oral discomfort, gingival pain or  bleeding RESPIRATORY: no cough, SOB, DOE, wheezing, hemoptysis CARDIAC: No chest pain or palpitations GI: No abdominal pain, diarrhea, constipation, heart burn, nausea or vomiting GU: Denies dysuria, frequency, hematuria, incontinence, or discharge NEUROLOGICAL: Denies dizziness, syncope, numbness, or headache PSYCHIATRIC: Denies feelings of depression or anxiety. No report of hallucinations, insomnia, paranoia, or agitation   Immunization History  Administered Date(s) Administered  . Fluad Quad(high Dose 65+) 02/11/2019  . Influenza Split 11/12/2010, 11/10/2011  . Influenza Whole 11/07/2009  . Influenza,inj,Quad PF,6+ Mos 10/19/2012, 01/05/2014, 11/08/2015, 01/29/2017  . Moderna SARS-COVID-2 Vaccination 03/09/2019, 04/06/2019  . Pneumococcal Conjugate-13 06/07/2014  . Pneumococcal Polysaccharide-23 12/26/1994, 03/05/2010  . Td 09/24/1993  . Tdap 11/12/2010, 07/12/2013   Pertinent  Health Maintenance Due  Topic Date Due  .  OPHTHALMOLOGY EXAM  11/07/2016  . FOOT EXAM  07/10/2017  . INFLUENZA VACCINE  09/25/2019  . HEMOGLOBIN A1C  10/14/2019  . LIPID PANEL  02/07/2020  . DEXA SCAN  Completed  . PNA vac Low Risk Adult  Completed   Fall Risk  08/02/2019 06/16/2019 05/19/2019 05/04/2019 04/27/2019  Falls in the past year? $RemoveBe'1 1 1 'QgBiCgIwe$ 0 0  Comment - pt denies-but dtr stated pt had a fall in last mths pt denies falls-but dtr states pt has had a fall - -  Number falls in past yr: 1 0 0 - 0  Injury with Fall? 0 0 0 - 0  Risk Factor Category  - - - - -  Risk for fall due to : Impaired mobility History of fall(s);Impaired balance/gait;Impaired mobility;Medication side effect History of fall(s);Impaired balance/gait;Impaired mobility;Medication side effect No Fall Risks -  Risk for fall due to: Comment - - - - -  Follow up Falls evaluation completed Falls evaluation completed;Falls prevention discussed Falls evaluation completed Falls evaluation completed -     Vitals:   10/11/19 1142  BP: 140/68  Pulse: 63  Resp: 17  Temp: 98 F (36.7 C)  TempSrc: Oral  Weight: 163 lb 3.2 oz (74 kg)  Height: $Remove'5\' 5"'iBEwsgo$  (1.651 m)   Body mass index is 27.16 kg/m.  Physical Exam  GENERAL APPEARANCE: Well nourished. In no acute distress. Normal body habitus SKIN:  Skin is warm and dry.  MOUTH and THROAT: Lips are without lesions. Oral mucosa is moist and without lesions. Tongue is normal in shape, size, and color and without lesions RESPIRATORY: Breathing is even & unlabored, BS CTAB CARDIAC: RRR, no murmur,no extra heart sounds, bilateral feet 1+edema, right chest pacemaker GI: Abdomen soft, normal BS, no masses, no tenderness EXTREMITIES:  Able to move X 4 extremities NEUROLOGICAL: There is no tremor. Speech is clear. Alert and oriented X 3. Affect  PSYCHIATRIC: and behavior are appropriate  Labs reviewed: Recent Labs    02/07/19 1323 02/07/19 1527 02/08/19 0556 02/09/19 0508 03/02/19 0253 05/04/19 1144 07/20/19 0301  07/20/19 0301 07/21/19 0356 08/02/19 0000 08/11/19 1638 08/11/19 1638 08/16/19 0000 08/23/19 1701 09/20/19 0000  NA 143   < > 145   < > 140   < > 137   < > 139   < > 139   < > 141 143 138  K 3.3*   < > 4.1   < > 3.7   < > 3.6   < > 3.6   < > 4.9   < > 4.1 4.4 4.2  CL 110   < > 109   < > 105   < > 98   < > 102   < > 98   < >  104 102 101  CO2 23   < > 27   < > 26   < > 27   < > 29   < > 27   < > 27* 25 23*  GLUCOSE 203*   < > 143*   < > 115*   < > 239*   < > 209*  --  171*  --   --  121*  --   BUN 25*   < > 26*   < > 24*   < > 26*   < > 20   < > 45*   < > 59* 44* 65*  CREATININE 1.48*   < > 1.58*   < > 1.47*   < > 1.56*   < > 1.35*   < > 1.70*  --  1.6* 1.62* 1.8*  CALCIUM 8.2*   < > 9.0   < > 8.8*   < > 8.6*   < > 8.5*   < > 9.0   < > 8.9 9.4 8.6*  MG 1.7   < > 2.3  --  1.7  --  1.8  --   --   --   --   --   --   --   --   PHOS 3.6  --   --   --   --   --   --   --   --   --   --   --   --   --   --    < > = values in this interval not displayed.   Recent Labs    02/07/19 1323 02/24/19 1020 07/17/19 2150  AST 13* 16 17  ALT $Re'12 13 13  'spX$ ALKPHOS 96 125 141*  BILITOT 1.0 0.6 0.8  PROT 6.5 7.8 7.6  ALBUMIN 3.1* 3.4* 3.4*   Recent Labs    07/17/19 2150 07/17/19 2150 07/19/19 0219 07/19/19 0219 07/20/19 0723 07/20/19 0723 08/02/19 0000 08/08/19 0000 09/20/19 0000  WBC 18.9*   < > 16.1*   < > 9.2  --  9.1 7.7 8.0  NEUTROABS 17.3*  --   --   --   --   --  $R'6 5 5  'Td$ HGB 12.8   < > 10.3*   < > 10.3*   < > 9.5* 10.9* 11.0*  HCT 40.0   < > 32.2*   < > 32.4*   < > 29* 33* 34*  MCV 94.8  --  93.9  --  93.9  --   --   --   --   PLT 256   < > 184   < > 193   < > 314 299 246   < > = values in this interval not displayed.   Lab Results  Component Value Date   TSH 1.630 02/07/2019   Lab Results  Component Value Date   HGBA1C 9 07/14/2019   Lab Results  Component Value Date   CHOL 99 02/07/2019   HDL 26 (L) 02/07/2019   LDLCALC 50 02/07/2019   TRIG 117 02/07/2019   CHOLHDL 3.8  02/07/2019    Assessment/Plan  1. Type 2 diabetes mellitus with stage 3b chronic kidney disease, with long-term current use of insulin ([place]) Lab Results  Component Value Date   HGBA1C 9 07/14/2019   -CBGs stable, continue Xultophy and CBG checks - referred to optometry and podiatry for routine check   2. Coronary artery disease involving native coronary artery  of native heart without angina pectoris -Denies chest pain, continue clopidogrel and atorvastatin  3. Essential hypertension -Stable, continue amlodipine and hydralazine  4. Chronic diastolic congestive heart failure (HCC) - no SOB, continue furosemide, metoprolol succinate ER and hydralazine  5. Major depression, recurrent, chronic (HCC) -Mood is stable, continue paroxetine  6. Slow transit constipation -  Will give extra dose of lactulose 10/15 mL give 30 mL in addition to the routine 30 mL daily     Family/ staff Communication: Discussed plan of care with resident and charge nurse.  Labs/tests ordered: None  Goals of care:   Long-term care   Durenda Age, DNP, MSN, FNP-BC [facility] [phone] (Monday-Friday 8:00 a.m. - 5:00 p.m.) [phone] (after hours)

## 2019-10-17 ENCOUNTER — Encounter: Payer: Self-pay | Admitting: Adult Health

## 2019-10-17 ENCOUNTER — Non-Acute Institutional Stay (SKILLED_NURSING_FACILITY): Payer: Medicare Other | Admitting: Adult Health

## 2019-10-17 DIAGNOSIS — K5901 Slow transit constipation: Secondary | ICD-10-CM

## 2019-10-17 DIAGNOSIS — L03031 Cellulitis of right toe: Secondary | ICD-10-CM | POA: Diagnosis not present

## 2019-10-17 DIAGNOSIS — Z794 Long term (current) use of insulin: Secondary | ICD-10-CM

## 2019-10-17 DIAGNOSIS — E1121 Type 2 diabetes mellitus with diabetic nephropathy: Secondary | ICD-10-CM | POA: Diagnosis not present

## 2019-10-17 DIAGNOSIS — N1832 Chronic kidney disease, stage 3b: Secondary | ICD-10-CM

## 2019-10-17 DIAGNOSIS — E1122 Type 2 diabetes mellitus with diabetic chronic kidney disease: Secondary | ICD-10-CM

## 2019-10-17 LAB — HM HEPATITIS C SCREENING LAB
HM Hepatitis Screen: NEGATIVE
HM Hepatitis Screen: NEGATIVE

## 2019-10-17 NOTE — Progress Notes (Signed)
Location:  [facility] Room Number: 217-A Place of Service:  SNF (31) Provider:  Durenda Age, DNP, FNP-BC  Patient Care Team: Hendricks Limes, MD as PCP - General (Internal Medicine) Dorothy Spark, MD as PCP - Cardiology (Cardiology) Constance Haw, MD as PCP - Electrophysiology (Cardiology) Chrismon, Amber as Social Worker Barrington Ellison, RN as Case Manager Medina-Vargas, Senaida Lange, NP as Nurse Practitioner (Internal Medicine) Rehab, [facility] ([facility])  Extended Emergency Contact Information Primary Emergency Contact: Lieutenant Diego [place] of [place] Phone: [phone] Mobile Phone: [phone] Relation: Daughter Secondary Emergency Contact: Armstrong,[name]  [place] Phone: [phone] Relation: Daughter  Code Status:  FULL CODE  Goals of care: Advanced Directive information Advanced Directives 10/19/2019  Does Patient Have a Medical Advance Directive? Yes  Type of Advance Directive (No Data)  Does patient want to make changes to medical advance directive? No - Patient declined  Copy of [place] in Chart? -  Would patient like information on creating a medical advance directive? -  Pre-existing out of facility DNR order (yellow form or pink MOST form) -     Chief Complaint  Patient presents with  . Acute Visit    Patient is seen for right third toe open wound    HPI:  Pt is a 74 y.o. female seen today for right third toe infection.  She is a long-term care resident of [facility].  She has a PMH of essential hypertension, dyslipidemia, history of stroke, chronic kidney disease, CAD, severe aortic stenosis, history of breast cancer and history of chronic diastolic CHF. She was seen in her room today. Right 3rd toenail noted to be wiggling and has moderate amount of serous drainage. Right big toe with black eschar, dry ad  no redness. She denies pain on right toes/foot. She denies chills, fever nor body aches. CBGs ranging from 85-177. She takes Xultophy 14 units daily for diabetes mellitus. She, also, complains of constipation today. She takes lactulose 30 mL daily for constipation.    Past Medical History:  Diagnosis Date  . Acute on chronic diastolic CHF (congestive heart failure) ([place]) 06/22/2017  . Acute on chronic renal failure ([place]) 05/25/2011  . Adenocarcinoma of breast ([place]) 1996   Completed tamoxifen and had mastectomy.  . Aortic stenosis, severe    s/p aortic valve replacement with porcine valve 06/2004.  ECHO 2010 EF 35%, LVH, diastolic dysfxn, Bioprostetic aoritc valve, mild AS. ECHO 2013 EF 60%, Nl aortic artificial valve, dynamic obstruction in the outflow tract   Class IIb rec for annual TTE after 5 yrs. She had a TTE 2013    . Arthritis   . CAD (coronary artery disease) 2006   s/p CABG (5/06) w/ saphenous vein to RCA at time of AVR  . CKD (chronic kidney disease) stage 2, GFR 60-89 ml/min 06/03/2011   She is no longer taking NSAIDs.   Marland Kitchen CVA (cerebral infarction) 2006   Post-op from AVR. Presumed embolic in nature. Carotid stenosis of R 60-79%. Repeat dopplers 4/10 no R stenosis and L stenosis of 1-29%.  . Depression    Controlled on Paxil  . Diabetes mellitus 1992   Dx 04/25/1990. Now insulin dependent, started 2008. On ACEI.   . Diverticulosis 2001  . History of 2019 novel coronavirus disease (COVID-19)   . Hyperlipidemia    Mgmt with a statin  . Hypertension    Requires 4 drug tx  .  Osteoporosis 2006   DEXA 10/06 : L femur T -2.8, R -2.7. Lumbar T -2.4. On bisphosphonates and  Calcium / Vit D.  . Presence of permanent cardiac pacemaker   . Refusal of blood transfusions as patient is Jehovah's Witness   . Stroke [facility])    Past Surgical History:  Procedure Laterality Date  . ABDOMINAL HYSTERECTOMY  1987   for fibroids  . AORTIC VALVE REPLACEMENT  2006  . CHOLECYSTECTOMY    . CORONARY  ARTERY BYPASS GRAFT  2006   Saphenous vein to RCA at time of AVR. Course complicated by acute respiratory failure, post-op PTX, ARI, ileus, CVA  . MASTECTOMY Left 1995   L for adenocarcinoma  . PACEMAKER IMPLANT N/A 02/09/2019   Procedure: PACEMAKER IMPLANT;  Surgeon: Constance Haw, MD;  Location: [place] CV LAB;  Service: Cardiovascular;  Laterality: N/A;  . RIGHT HEART CATH AND CORONARY/GRAFT ANGIOGRAPHY N/A 02/09/2019   Procedure: RIGHT HEART CATH AND CORONARY/GRAFT ANGIOGRAPHY;  Surgeon: Sherren Mocha, MD;  Location: [place] CV LAB;  Service: Cardiovascular;  Laterality: N/A;  . TEE WITHOUT CARDIOVERSION N/A 07/20/2019   Procedure: TRANSESOPHAGEAL ECHOCARDIOGRAM (TEE);  Surgeon: Jerline Pain, MD;  Location: [facility] ENDOSCOPY;  Service: Cardiovascular;  Laterality: N/A;  . TRANSCATHETER AORTIC VALVE REPLACEMENT, TRANSFEMORAL N/A 03/01/2019   Procedure: TRANSCATHETER AORTIC VALVE REPLACEMENT, TRANSFEMORAL;  Surgeon: Sherren Mocha, MD;  Location: [place] CV LAB;  Service: Open Heart Surgery;  Laterality: N/A;    Allergies  Allergen Reactions  . 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," as the patient is a Jehovah's Witness    Outpatient Encounter Medications as of 10/17/2019  Medication Sig  . acetaminophen (TYLENOL) 325 MG tablet Take 650 mg by mouth every 6 (six) hours as needed.   Marland Kitchen amLODipine (NORVASC) 5 MG tablet Take 1 tablet (5 mg total) by mouth daily.  Marland Kitchen amoxicillin (AMOXIL) 500 MG capsule Take 2,000 mg by mouth as needed. Prior to dental procedure  . aspirin 81 MG EC tablet TAKE 1 TABLET (81 MG TOTAL) BY MOUTH DAILY. SWALLOW WHOLE.  Marland Kitchen atorvastatin (LIPITOR) 80 MG tablet Take 1 tablet (80 mg total) by mouth at bedtime.  . clopidogrel (PLAVIX) 75 MG tablet Take 1 tablet (75 mg total) by mouth daily with breakfast.  . diclofenac Sodium (VOLTAREN) 1 % GEL APPLY 2 GRAMS TOPICALLY 4 TIMES DAILY  . furosemide (LASIX) 80 MG tablet Take 80 mg by mouth 2 (two)  times daily.  . hydrALAZINE (APRESOLINE) 50 MG tablet Take 1 tablet (50 mg total) by mouth 3 (three) times daily.  . Insulin Degludec-Liraglutide (XULTOPHY) 100-3.6 UNIT-MG/ML SOPN Inject 14 Units into the skin daily.  Marland Kitchen lactulose (CHRONULAC) 10 GM/15ML solution Take by mouth 2 (two) times daily. 30 ml  . metoprolol succinate (TOPROL-XL) 25 MG 24 hr tablet Take 1 tablet (25 mg total) by mouth daily.  Marland Kitchen PARoxetine (PAXIL) 30 MG tablet TAKE 1 TABLET BY MOUTH DAILY  . potassium chloride 20 MEQ TBCR Take 20 mEq by mouth daily.  Marland Kitchen saccharomyces boulardii (FLORASTOR) 250 MG capsule Take 250 mg by mouth 2 (two) times daily.  Marland Kitchen EASY TOUCH PEN NEEDLES 31G X 5 MM MISC USE TO INJECT INSULIN INTO THE SKIN (Patient not taking: Reported on 10/11/2019)   No facility-administered encounter medications on file as of 10/17/2019.    Review of Systems  GENERAL: No change in appetite, no fatigue, no weight changes, no fever, chills or weakness MOUTH and THROAT: Denies oral  discomfort, gingival pain or bleeding RESPIRATORY: no cough, SOB, DOE, wheezing, hemoptysis CARDIAC: No chest pain or palpitations GI: + Constipation GU: Denies dysuria, frequency, hematuria, incontinence, or discharge NEUROLOGICAL: Denies dizziness, syncope, numbness, or headache PSYCHIATRIC: Denies feelings of depression or anxiety. No report of hallucinations, insomnia, paranoia, or agitation   Immunization History  Administered Date(s) Administered  . Fluad Quad(high Dose 65+) 02/11/2019  . Influenza Split 11/12/2010, 11/10/2011  . Influenza Whole 11/07/2009  . Influenza,inj,Quad PF,6+ Mos 10/19/2012, 01/05/2014, 11/08/2015, 01/29/2017  . Moderna SARS-COVID-2 Vaccination 03/09/2019, 04/06/2019  . Pneumococcal Conjugate-13 06/07/2014  . Pneumococcal Polysaccharide-23 12/26/1994, 03/05/2010  . Td 09/24/1993  . Tdap 11/12/2010, 07/12/2013   Pertinent  Health Maintenance Due  Topic Date Due  . OPHTHALMOLOGY EXAM  11/07/2016  .  FOOT EXAM  07/10/2017  . INFLUENZA VACCINE  09/25/2019  . HEMOGLOBIN A1C  10/14/2019  . LIPID PANEL  02/07/2020  . DEXA SCAN  Completed  . PNA vac Low Risk Adult  Completed   Fall Risk  08/02/2019 06/16/2019 05/19/2019 05/04/2019 04/27/2019  Falls in the past year? $RemoveBe'1 1 1 'tRTMnYkwR$ 0 0  Comment - pt denies-but dtr stated pt had a fall in last mths pt denies falls-but dtr states pt has had a fall - -  Number falls in past yr: 1 0 0 - 0  Injury with Fall? 0 0 0 - 0  Risk Factor Category  - - - - -  Risk for fall due to : Impaired mobility History of fall(s);Impaired balance/gait;Impaired mobility;Medication side effect History of fall(s);Impaired balance/gait;Impaired mobility;Medication side effect No Fall Risks -  Risk for fall due to: Comment - - - - -  Follow up Falls evaluation completed Falls evaluation completed;Falls prevention discussed Falls evaluation completed Falls evaluation completed -     Vitals:   10/17/19 1618  BP: 130/82  Pulse: 84  Resp: 20  Temp: 98.1 F (36.7 C)  TempSrc: Oral  Weight: 163 lb 3.2 oz (74 kg)  Height: $Remove'5\' 5"'ebSbIvi$  (1.651 m)   Body mass index is 27.16 kg/m.  Physical Exam  GENERAL APPEARANCE: Well nourished. In no acute distress. Normal body habitus SKIN:  See HPI MOUTH and THROAT: Lips are without lesions. Oral mucosa is moist and without lesions.  RESPIRATORY: Breathing is even & unlabored, BS CTAB CARDIAC: RRR, no murmur,no extra heart sounds, no edema GI: Abdomen soft, normal BS, no masses, no tenderness EXTREMITIES:  Able to move X 4 extremities NEUROLOGICAL: There is no tremor. Speech is clear. Alert and oriented X 3. PSYCHIATRIC:  Affect and behavior are appropriate  Labs reviewed: Recent Labs    02/07/19 1323 02/07/19 1527 02/08/19 0556 02/09/19 0508 03/02/19 0253 05/04/19 1144 07/20/19 0301 07/20/19 0301 07/21/19 0356 08/02/19 0000 08/11/19 1638 08/11/19 1638 08/16/19 0000 08/23/19 1701 09/20/19 0000  NA 143   < > 145   < > 140   < >  137   < > 139   < > 139   < > 141 143 138  K 3.3*   < > 4.1   < > 3.7   < > 3.6   < > 3.6   < > 4.9   < > 4.1 4.4 4.2  CL 110   < > 109   < > 105   < > 98   < > 102   < > 98   < > 104 102 101  CO2 23   < > 27   < > 26   < >  27   < > 29   < > 27   < > 27* 25 23*  GLUCOSE 203*   < > 143*   < > 115*   < > 239*   < > 209*  --  171*  --   --  121*  --   BUN 25*   < > 26*   < > 24*   < > 26*   < > 20   < > 45*   < > 59* 44* 65*  CREATININE 1.48*   < > 1.58*   < > 1.47*   < > 1.56*   < > 1.35*   < > 1.70*  --  1.6* 1.62* 1.8*  CALCIUM 8.2*   < > 9.0   < > 8.8*   < > 8.6*   < > 8.5*   < > 9.0   < > 8.9 9.4 8.6*  MG 1.7   < > 2.3  --  1.7  --  1.8  --   --   --   --   --   --   --   --   PHOS 3.6  --   --   --   --   --   --   --   --   --   --   --   --   --   --    < > = values in this interval not displayed.   Recent Labs    02/07/19 1323 02/24/19 1020 07/17/19 2150  AST 13* 16 17  ALT $Re'12 13 13  'Qkz$ ALKPHOS 96 125 141*  BILITOT 1.0 0.6 0.8  PROT 6.5 7.8 7.6  ALBUMIN 3.1* 3.4* 3.4*   Recent Labs    07/17/19 2150 07/17/19 2150 07/19/19 0219 07/19/19 0219 07/20/19 0723 07/20/19 0723 08/02/19 0000 08/08/19 0000 09/20/19 0000  WBC 18.9*   < > 16.1*   < > 9.2  --  9.1 7.7 8.0  NEUTROABS 17.3*  --   --   --   --   --  $R'6 5 5  'kd$ HGB 12.8   < > 10.3*   < > 10.3*   < > 9.5* 10.9* 11.0*  HCT 40.0   < > 32.2*   < > 32.4*   < > 29* 33* 34*  MCV 94.8  --  93.9  --  93.9  --   --   --   --   PLT 256   < > 184   < > 193   < > 314 299 246   < > = values in this interval not displayed.   Lab Results  Component Value Date   TSH 1.630 02/07/2019   Lab Results  Component Value Date   HGBA1C 9 07/14/2019   Lab Results  Component Value Date   CHOL 99 02/07/2019   HDL 26 (L) 02/07/2019   LDLCALC 50 02/07/2019   TRIG 117 02/07/2019   CHOLHDL 3.8 02/07/2019    Assessment/Plan  1. Cellulitis of third toe of right foot -  Will start on doxycycline 100 mg BID x10 days and Florastor 250 mg BID  x13 days -Treatment to right third toe daily  2. Type 2 diabetes mellitus with stage 3b chronic kidney disease, with long-term current use of insulin (HCC) Lab Results  Component Value Date   HGBA1C 9 07/14/2019   -Continue Xultophy 14 units SQ daily and CBG checks  3. Slow transit  constipation -We will increase lactulose 10 g / 15 mL 30 mL from daily to twice a day     Family/ staff Communication: Discussed plan of care with resident and charge nurse.  Labs/tests ordered: None  Goals of care:   Long-term care   Durenda Age, DNP, MSN, FNP-BC [facility] [phone] (Monday-Friday 8:00 a.m. - 5:00 p.m.) [phone] (after hours)

## 2019-10-19 ENCOUNTER — Non-Acute Institutional Stay (SKILLED_NURSING_FACILITY): Payer: Medicare Other | Admitting: Nurse Practitioner

## 2019-10-19 ENCOUNTER — Encounter: Payer: Self-pay | Admitting: Nurse Practitioner

## 2019-10-19 DIAGNOSIS — E1121 Type 2 diabetes mellitus with diabetic nephropathy: Secondary | ICD-10-CM

## 2019-10-19 DIAGNOSIS — N1832 Chronic kidney disease, stage 3b: Secondary | ICD-10-CM

## 2019-10-19 DIAGNOSIS — F325 Major depressive disorder, single episode, in full remission: Secondary | ICD-10-CM

## 2019-10-19 DIAGNOSIS — E1122 Type 2 diabetes mellitus with diabetic chronic kidney disease: Secondary | ICD-10-CM

## 2019-10-19 DIAGNOSIS — L089 Local infection of the skin and subcutaneous tissue, unspecified: Secondary | ICD-10-CM | POA: Diagnosis not present

## 2019-10-19 DIAGNOSIS — Z794 Long term (current) use of insulin: Secondary | ICD-10-CM

## 2019-10-19 DIAGNOSIS — I130 Hypertensive heart and chronic kidney disease with heart failure and stage 1 through stage 4 chronic kidney disease, or unspecified chronic kidney disease: Secondary | ICD-10-CM | POA: Diagnosis not present

## 2019-10-19 DIAGNOSIS — I5030 Unspecified diastolic (congestive) heart failure: Secondary | ICD-10-CM

## 2019-10-19 DIAGNOSIS — N183 Chronic kidney disease, stage 3 unspecified: Secondary | ICD-10-CM

## 2019-10-19 DIAGNOSIS — R600 Localized edema: Secondary | ICD-10-CM

## 2019-10-19 NOTE — Progress Notes (Signed)
Location:    [place] Room Number: 217-A Place of Service:  SNF (31) Provider: Bentlee Drier Otho Darner NP  Patient Care Team: Hendricks Limes, MD as PCP - General (Internal Medicine) Dorothy Spark, MD as PCP - Cardiology (Cardiology) Constance Haw, MD as PCP - Electrophysiology (Cardiology) Chrismon, Amber as Social Worker Barrington Ellison, RN as Case Manager Medina-Vargas, Senaida Lange, NP as Nurse Practitioner (Internal Medicine) Rehab, [facility])  Extended Emergency Contact Information Primary Emergency Contact: [place] Phone: [phone] Mobile Phone: [phone] Relation: Daughter Secondary Emergency Contact: Armstrong,[name]  [place] Phone: [phone] Relation: Daughter  Code Status:  Full Code Goals of care: Advanced Directive information Advanced Directives 10/19/2019  Does Patient Have a Medical Advance Directive? Yes  Type of Advance Directive (No Data)  Does patient want to make changes to medical advance directive? No - Patient declined  Copy of [place] in Chart? -  Would patient like information on creating a medical advance directive? -  Pre-existing out of facility DNR order (yellow form or pink MOST form) -     Chief Complaint  Patient presents with  . Acute Visit    Lower Back Rash    HPI:  Pt is a 74 y.o. female seen today for an acute visit for lower back scabbed over areas about a quarter sized with 4-5 white head around, no heat, pain, or redness. The patient stated she didn't feel it, at least nothing resembles last shingle rash eruptions. She is in her usual state of health today.   Depression, takes Paxil  HTN, takes Metoprolol, Amlodipine  T2DM, taking insulin  Edema BLE, takes Furosemide  Started Doxy 10/17/19 for R 3rd toe infection, covered in dressing during today's visit.   CKD,  creat 1.8 09/20/19     Past Medical History:  Diagnosis Date  . Acute on chronic diastolic CHF (congestive heart failure) ([place]) 06/22/2017  . Acute on chronic renal failure ([place]) 05/25/2011  . Adenocarcinoma of breast ([place]) 1996   Completed tamoxifen and had mastectomy.  . Aortic stenosis, severe    s/p aortic valve replacement with porcine valve 06/2004.  ECHO 2010 EF 62%, LVH, diastolic dysfxn, Bioprostetic aoritc valve, mild AS. ECHO 2013 EF 60%, Nl aortic artificial valve, dynamic obstruction in the outflow tract   Class IIb rec for annual TTE after 5 yrs. She had a TTE 2013    . Arthritis   . CAD (coronary artery disease) 2006   s/p CABG (5/06) w/ saphenous vein to RCA at time of AVR  . CKD (chronic kidney disease) stage 2, GFR 60-89 ml/min 06/03/2011   She is no longer taking NSAIDs.   [place] CVA (cerebral infarction) 2006   Post-op from AVR. Presumed embolic in nature. Carotid stenosis of R 60-79%. Repeat dopplers 4/10 no R stenosis and L stenosis of 1-29%.  . Depression    Controlled on Paxil  . Diabetes mellitus 1992   Dx 04/25/1990. Now insulin dependent, started 2008. On ACEI.   . Diverticulosis 2001  . History of 2019 novel coronavirus disease (COVID-19)   . Hyperlipidemia    Mgmt with a statin  . Hypertension    Requires 4 drug tx  . Osteoporosis 2006   DEXA 10/06 : L femur T -2.8, R -2.7. Lumbar T -2.4. On bisphosphonates and  Calcium / Vit D.  . Presence  of permanent cardiac pacemaker   . Refusal of blood transfusions as patient is Jehovah's Witness   . Stroke [facility])    Past Surgical History:  Procedure Laterality Date  . ABDOMINAL HYSTERECTOMY  1987   for fibroids  . AORTIC VALVE REPLACEMENT  2006  . CHOLECYSTECTOMY    . CORONARY ARTERY BYPASS GRAFT  2006   Saphenous vein to RCA at time of AVR. Course complicated by acute respiratory failure, post-op PTX, ARI, ileus, CVA  . MASTECTOMY Left 1995   L for adenocarcinoma  . PACEMAKER IMPLANT N/A 02/09/2019   Procedure:  PACEMAKER IMPLANT;  Surgeon: Constance Haw, MD;  Location: [place] CV LAB;  Service: Cardiovascular;  Laterality: N/A;  . RIGHT HEART CATH AND CORONARY/GRAFT ANGIOGRAPHY N/A 02/09/2019   Procedure: RIGHT HEART CATH AND CORONARY/GRAFT ANGIOGRAPHY;  Surgeon: Sherren Mocha, MD;  Location: [place] CV LAB;  Service: Cardiovascular;  Laterality: N/A;  . TEE WITHOUT CARDIOVERSION N/A 07/20/2019   Procedure: TRANSESOPHAGEAL ECHOCARDIOGRAM (TEE);  Surgeon: Jerline Pain, MD;  Location: [facility] ENDOSCOPY;  Service: Cardiovascular;  Laterality: N/A;  . TRANSCATHETER AORTIC VALVE REPLACEMENT, TRANSFEMORAL N/A 03/01/2019   Procedure: TRANSCATHETER AORTIC VALVE REPLACEMENT, TRANSFEMORAL;  Surgeon: Sherren Mocha, MD;  Location: [place] CV LAB;  Service: Open Heart Surgery;  Laterality: N/A;    Allergies  Allergen Reactions  . 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," as the patient is a Jehovah's Witness    Allergies as of 10/19/2019      Reactions   Other Other (See Comments)   NO "blood products," as the patient is a Jehovah's Witness      Medication List       Accurate as of October 19, 2019 11:59 PM. If you have any questions, ask your nurse or doctor.        acetaminophen 325 MG tablet Commonly known as: TYLENOL Take 650 mg by mouth every 6 (six) hours as needed.   acetaminophen 325 MG tablet Commonly known as: TYLENOL Take 650 mg by mouth in the morning.   amLODipine 5 MG tablet Commonly known as: NORVASC Take 1 tablet (5 mg total) by mouth daily.   amoxicillin 500 MG capsule Commonly known as: AMOXIL Take 2,000 mg by mouth as needed. Prior to dental procedure   aspirin 81 MG EC tablet TAKE 1 TABLET (81 MG TOTAL) BY MOUTH DAILY. SWALLOW WHOLE.   atorvastatin 80 MG tablet Commonly known as: LIPITOR Take 1 tablet (80 mg total) by mouth at bedtime.   clopidogrel 75 MG tablet Commonly known as: PLAVIX Take 1 tablet (75 mg total) by mouth daily with  breakfast.   diclofenac Sodium 1 % Gel Commonly known as: VOLTAREN APPLY 2 GRAMS TOPICALLY 4 TIMES DAILY   doxycycline 100 MG EC tablet Commonly known as: DORYX Take 100 mg by mouth 2 (two) times daily. FOR RIGHT 3RD TOE INFECTION   Easy Touch Pen Needles 31G X 5 MM Misc Generic drug: Insulin Pen Needle USE TO INJECT INSULIN INTO THE SKIN   furosemide 80 MG tablet Commonly known as: LASIX Take 80 mg by mouth 2 (two) times daily.   hydrALAZINE 50 MG tablet Commonly known as: APRESOLINE Take 1 tablet (50 mg total) by mouth 3 (three) times daily.   lactulose 10 GM/15ML solution Commonly known as: CHRONULAC Take by mouth 2 (two) times daily. 30 ml   metoprolol succinate 25 MG 24 hr tablet Commonly known as: TOPROL-XL Take 1 tablet (25 mg total)  by mouth daily.   PARoxetine 30 MG tablet Commonly known as: PAXIL TAKE 1 TABLET BY MOUTH DAILY   Potassium Chloride ER 20 MEQ Tbcr Take 20 mEq by mouth daily.   saccharomyces boulardii 250 MG capsule Commonly known as: FLORASTOR Take 250 mg by mouth 2 (two) times daily.   Xultophy 100-3.6 UNIT-MG/ML Sopn Generic drug: Insulin Degludec-Liraglutide Inject 14 Units into the skin daily.       Review of Systems  Constitutional: Negative for activity change, appetite change and fever.  Eyes: Negative for visual disturbance.  Respiratory: Negative for cough and shortness of breath.   Cardiovascular: Positive for leg swelling. Negative for chest pain and palpitations.  Gastrointestinal: Negative for abdominal pain, constipation, nausea and vomiting.  Genitourinary: Negative for dysuria and urgency.  Musculoskeletal: Positive for gait problem.  Skin: Positive for rash.  Neurological: Negative for speech difficulty, weakness and headaches.  Psychiatric/Behavioral: Negative for behavioral problems and sleep disturbance. The patient is not nervous/anxious.     Immunization History  Administered Date(s) Administered  . Fluad  Quad(high Dose 65+) 02/11/2019  . Influenza Split 11/12/2010, 11/10/2011  . Influenza Whole 11/07/2009  . Influenza,inj,Quad PF,6+ Mos 10/19/2012, 01/05/2014, 11/08/2015, 01/29/2017  . Moderna SARS-COVID-2 Vaccination 03/09/2019, 04/06/2019  . Pneumococcal Conjugate-13 06/07/2014  . Pneumococcal Polysaccharide-23 12/26/1994, 03/05/2010  . Td 09/24/1993  . Tdap 11/12/2010, 07/12/2013   Pertinent  Health Maintenance Due  Topic Date Due  . OPHTHALMOLOGY EXAM  11/07/2016  . FOOT EXAM  07/10/2017  . INFLUENZA VACCINE  09/25/2019  . HEMOGLOBIN A1C  10/14/2019  . LIPID PANEL  02/07/2020  . DEXA SCAN  Completed  . PNA vac Low Risk Adult  Completed   Fall Risk  08/02/2019 06/16/2019 05/19/2019 05/04/2019 04/27/2019  Falls in the past year? 1 1 1  0 0  Comment - pt denies-but dtr stated pt had a fall in last mths pt denies falls-but dtr states pt has had a fall - -  Number falls in past yr: 1 0 0 - 0  Injury with Fall? 0 0 0 - 0  Risk Factor Category  - - - - -  Risk for fall due to : Impaired mobility History of fall(s);Impaired balance/gait;Impaired mobility;Medication side effect History of fall(s);Impaired balance/gait;Impaired mobility;Medication side effect No Fall Risks -  Risk for fall due to: Comment - - - - -  Follow up Falls evaluation completed Falls evaluation completed;Falls prevention discussed Falls evaluation completed Falls evaluation completed -   Functional Status Survey:    Vitals:   10/19/19 1504  BP: 112/66  Pulse: 61  Resp: 18  Temp: 98.3 F (36.8 C)  TempSrc: Oral  SpO2: 99%  Weight: 163 lb 3.2 oz (74 kg)  Height: 5\' 5"  (1.651 m)   Body mass index is 27.16 kg/m. Physical Exam Vitals and nursing note reviewed.  Constitutional:      Appearance: Normal appearance.  HENT:     Head: Normocephalic and atraumatic.     Nose: Nose normal.     Mouth/Throat:     Mouth: Mucous membranes are moist.  Eyes:     Extraocular Movements: Extraocular movements intact.      Conjunctiva/sclera: Conjunctivae normal.     Pupils: Pupils are equal, round, and reactive to light.  Cardiovascular:     Rate and Rhythm: Normal rate and regular rhythm.     Heart sounds: No murmur heard.   Pulmonary:     Effort: Pulmonary effort is normal.     Breath sounds:  No wheezing, rhonchi or rales.  Abdominal:     General: Bowel sounds are normal.     Palpations: Abdomen is soft.     Tenderness: There is no abdominal tenderness.  Musculoskeletal:     Cervical back: Normal range of motion and neck supple.     Right lower leg: Edema present.     Left lower leg: Edema present.     Comments: Mild edema BLE  Skin:    General: Skin is warm and dry.     Comments: for lower back scabbed over areas about a quarter sized with 4-5 white head around, no heat, pain, or redness.  Neurological:     General: No focal deficit present.     Mental Status: She is alert and oriented to person, place, and time. Mental status is at baseline.     Motor: No weakness.     Gait: Gait abnormal.  Psychiatric:        Mood and Affect: Mood normal.        Behavior: Behavior normal.        Thought Content: Thought content normal.        Judgment: Judgment normal.     Labs reviewed: Recent Labs    02/07/19 1323 02/07/19 1527 02/08/19 0556 02/09/19 0508 03/02/19 0253 05/04/19 1144 07/20/19 0301 07/20/19 0301 07/21/19 0356 08/02/19 0000 08/11/19 1638 08/11/19 1638 08/16/19 0000 08/23/19 1701 09/20/19 0000  NA 143   < > 145   < > 140   < > 137   < > 139   < > 139   < > 141 143 138  K 3.3*   < > 4.1   < > 3.7   < > 3.6   < > 3.6   < > 4.9   < > 4.1 4.4 4.2  CL 110   < > 109   < > 105   < > 98   < > 102   < > 98   < > 104 102 101  CO2 23   < > 27   < > 26   < > 27   < > 29   < > 27   < > 27* 25 23*  GLUCOSE 203*   < > 143*   < > 115*   < > 239*   < > 209*  --  171*  --   --  121*  --   BUN 25*   < > 26*   < > 24*   < > 26*   < > 20   < > 45*   < > 59* 44* 65*  CREATININE 1.48*   < > 1.58*    < > 1.47*   < > 1.56*   < > 1.35*   < > 1.70*  --  1.6* 1.62* 1.8*  CALCIUM 8.2*   < > 9.0   < > 8.8*   < > 8.6*   < > 8.5*   < > 9.0   < > 8.9 9.4 8.6*  MG 1.7   < > 2.3  --  1.7  --  1.8  --   --   --   --   --   --   --   --   PHOS 3.6  --   --   --   --   --   --   --   --   --   --   --   --   --   --    < > =  values in this interval not displayed.   Recent Labs    02/07/19 1323 02/24/19 1020 07/17/19 2150  AST 13* 16 17  ALT $Re'12 13 13  'kTh$ ALKPHOS 96 125 141*  BILITOT 1.0 0.6 0.8  PROT 6.5 7.8 7.6  ALBUMIN 3.1* 3.4* 3.4*   Recent Labs    07/17/19 2150 07/17/19 2150 07/19/19 0219 07/19/19 0219 07/20/19 0723 07/20/19 0723 08/02/19 0000 08/08/19 0000 09/20/19 0000  WBC 18.9*   < > 16.1*   < > 9.2  --  9.1 7.7 8.0  NEUTROABS 17.3*  --   --   --   --   --  $R'6 5 5  'om$ HGB 12.8   < > 10.3*   < > 10.3*   < > 9.5* 10.9* 11.0*  HCT 40.0   < > 32.2*   < > 32.4*   < > 29* 33* 34*  MCV 94.8  --  93.9  --  93.9  --   --   --   --   PLT 256   < > 184   < > 193   < > 314 299 246   < > = values in this interval not displayed.   Lab Results  Component Value Date   TSH 1.630 02/07/2019   Lab Results  Component Value Date   HGBA1C 9 07/14/2019   Lab Results  Component Value Date   CHOL 99 02/07/2019   HDL 26 (L) 02/07/2019   LDLCALC 50 02/07/2019   TRIG 117 02/07/2019   CHOLHDL 3.8 02/07/2019    Significant Diagnostic Results in last 30 days:  No results found.  Assessment/Plan Skin pustule lower back scabbed over areas about a quarter sized with 4-5 white head around, no heat, pain, or redness. The patient stated she didn't feel it, at least nothing resembles last shingle rash eruptions. She is in her usual state of health today.  Already on Doxy for the R 3rd toe infection, adding Bactroban oint bid to affected x 7days.   Depression Her mood is stable, continue Paxile.   Benign hypertensive heart and kidney disease with diastolic CHF, NYHA class 3 and CKD stage 3  (HCC) Blood pressure is controlled, continue Metoprolol, Amlodipine, creat 1.8 09/20/19  DM (diabetes mellitus) ([place]) Continue insulin for blood sugar control.   Bilateral lower extremity edema Mild, continue Furosemide.      Family/ staff Communication: plan of care reviewed with the patient and charge nurse.   Labs/tests ordered:  none  Time spend 35 minutes.

## 2019-10-20 ENCOUNTER — Encounter: Payer: Self-pay | Admitting: Nurse Practitioner

## 2019-10-20 DIAGNOSIS — L089 Local infection of the skin and subcutaneous tissue, unspecified: Secondary | ICD-10-CM | POA: Insufficient documentation

## 2019-10-20 NOTE — Assessment & Plan Note (Signed)
Mild, continue Furosemide.

## 2019-10-20 NOTE — Assessment & Plan Note (Signed)
Continue insulin for blood sugar control.

## 2019-10-20 NOTE — Assessment & Plan Note (Signed)
lower back scabbed over areas about a quarter sized with 4-5 white head around, no heat, pain, or redness. The patient stated she didn't feel it, at least nothing resembles last shingle rash eruptions. She is in her usual state of health today.  Already on Doxy for the R 3rd toe infection, adding Bactroban oint bid to affected x 7days.

## 2019-10-20 NOTE — Assessment & Plan Note (Signed)
Her mood is stable, continue Paxile.

## 2019-10-20 NOTE — Assessment & Plan Note (Signed)
Blood pressure is controlled, continue Metoprolol, Amlodipine, creat 1.8 09/20/19

## 2019-10-26 NOTE — Telephone Encounter (Signed)
Not pt of [facility]

## 2019-11-11 ENCOUNTER — Encounter: Payer: Self-pay | Admitting: Adult Health

## 2019-11-11 ENCOUNTER — Non-Acute Institutional Stay (SKILLED_NURSING_FACILITY): Payer: Medicare Other | Admitting: Adult Health

## 2019-11-11 DIAGNOSIS — I5032 Chronic diastolic (congestive) heart failure: Secondary | ICD-10-CM

## 2019-11-11 DIAGNOSIS — E1122 Type 2 diabetes mellitus with diabetic chronic kidney disease: Secondary | ICD-10-CM

## 2019-11-11 DIAGNOSIS — Z794 Long term (current) use of insulin: Secondary | ICD-10-CM

## 2019-11-11 DIAGNOSIS — E1121 Type 2 diabetes mellitus with diabetic nephropathy: Secondary | ICD-10-CM

## 2019-11-11 DIAGNOSIS — I10 Essential (primary) hypertension: Secondary | ICD-10-CM

## 2019-11-11 DIAGNOSIS — N1832 Chronic kidney disease, stage 3b: Secondary | ICD-10-CM

## 2019-11-11 DIAGNOSIS — L03031 Cellulitis of right toe: Secondary | ICD-10-CM | POA: Diagnosis not present

## 2019-11-11 NOTE — Progress Notes (Signed)
Location:  [facility] Room Number: 217-A Place of Service:  SNF (31) Provider:  Kenard Gower, DNP, FNP-BC  Patient Care Team: Pecola Lawless, MD as PCP - General (Internal Medicine) Lars Masson, MD as PCP - Cardiology (Cardiology) Regan Lemming, MD as PCP - Electrophysiology (Cardiology) Chrismon, Amber as Social Worker Bary Richard, RN as Case Manager Medina-Vargas, Margit Banda, NP as Nurse Practitioner (Internal Medicine) Rehab, [facility])  Extended Emergency Contact Information Primary Emergency Contact: Malen Gauze [place] Home Phone: [phone] Mobile Phone: [phone] Relation: Daughter Secondary Emergency Contact: Armstrong,[name]  [place] Home Phone: [phone] Relation: Daughter  Code Status:  FULL CODE  Goals of care: Advanced Directive information Advanced Directives 11/11/2019  Does Patient Have a Medical Advance Directive? Yes  Type of Advance Directive -  Does patient want to make changes to medical advance directive? No - Patient declined  Copy of Healthcare Power of Attorney in Chart? -  Would patient like information on creating a medical advance directive? -  Pre-existing out of facility DNR order (yellow form or pink MOST form) -     Chief Complaint  Patient presents with  . Medical Management of Chronic Issues    Routine [facility] visit    HPI:  Pt is a 74 y.o. female seen today for medical management of chronic diseases. She is a long-term care resident of [facility]. She has a PMH of essential hypertension, dyslipidemia, history of stroke, chronic kidney disease, CAD, severe aortic stenosis, history of breast cancer and history of chronic diastolic CHF. She was seen in her room today. She was noted to have right big toe with serous drainage. Right big toe has small callus on top. No erythema  noted. Right big toe is keratotic. She denies pain on the right foot. No reported fever, chills nor SOB. SBPs ranging from 107 to 129. She takes Amlodipine and Metoprolol for hypertension.   Past Medical History:  Diagnosis Date  . Acute on chronic diastolic CHF (congestive heart failure) (HCC) 06/22/2017  . Acute on chronic renal failure (HCC) 05/25/2011  . Adenocarcinoma of breast (HCC) 1996   Completed tamoxifen and had mastectomy.  . Aortic stenosis, severe    s/p aortic valve replacement with porcine valve 06/2004.  ECHO 2010 EF 65%, LVH, diastolic dysfxn, Bioprostetic aoritc valve, mild AS. ECHO 2013 EF 60%, Nl aortic artificial valve, dynamic obstruction in the outflow tract   Class IIb rec for annual TTE after 5 yrs. She had a TTE 2013    . Arthritis   . CAD (coronary artery disease) 2006   s/p CABG (5/06) w/ saphenous vein to RCA at time of AVR  . CKD (chronic kidney disease) stage 2, GFR 60-89 ml/min 06/03/2011   She is no longer taking NSAIDs.   Marland Kitchen CVA (cerebral infarction) 2006   Post-op from AVR. Presumed embolic in nature. Carotid stenosis of R 60-79%. Repeat dopplers 4/10 no R stenosis and L stenosis of 1-29%.  . Depression    Controlled on Paxil  . Diabetes mellitus 1992   Dx 04/25/1990. Now insulin dependent, started 2008. On ACEI.   . Diverticulosis 2001  . History of 2019 novel coronavirus disease (COVID-19)   . Hyperlipidemia    Mgmt with a statin  . Hypertension    Requires 4 drug tx  . Osteoporosis 2006   DEXA 10/06 : L femur T -2.8, R -2.7. Lumbar  T -2.4. On bisphosphonates and  Calcium / Vit D.  . Presence of permanent cardiac pacemaker   . Refusal of blood transfusions as patient is Jehovah's Witness   . Stroke [facility])    Past Surgical History:  Procedure Laterality Date  . ABDOMINAL HYSTERECTOMY  1987   for fibroids  . AORTIC VALVE REPLACEMENT  2006  . CHOLECYSTECTOMY    . CORONARY ARTERY BYPASS GRAFT  2006   Saphenous vein to RCA at time of AVR. Course  complicated by acute respiratory failure, post-op PTX, ARI, ileus, CVA  . MASTECTOMY Left 1995   L for adenocarcinoma  . PACEMAKER IMPLANT N/A 02/09/2019   Procedure: PACEMAKER IMPLANT;  Surgeon: Constance Haw, MD;  Location: [place] [facility] LAB;  Service: Cardiovascular;  Laterality: N/A;  . RIGHT HEART CATH AND CORONARY/GRAFT ANGIOGRAPHY N/A 02/09/2019   Procedure: RIGHT HEART CATH AND CORONARY/GRAFT ANGIOGRAPHY;  Surgeon: Sherren Mocha, MD;  Location: [place] [facility] LAB;  Service: Cardiovascular;  Laterality: N/A;  . TEE WITHOUT CARDIOVERSION N/A 07/20/2019   Procedure: TRANSESOPHAGEAL ECHOCARDIOGRAM (TEE);  Surgeon: Jerline Pain, MD;  Location: [facility] ENDOSCOPY;  Service: Cardiovascular;  Laterality: N/A;  . TRANSCATHETER AORTIC VALVE REPLACEMENT, TRANSFEMORAL N/A 03/01/2019   Procedure: TRANSCATHETER AORTIC VALVE REPLACEMENT, TRANSFEMORAL;  Surgeon: Sherren Mocha, MD;  Location: [place] CV LAB;  Service: Open Heart Surgery;  Laterality: N/A;    Allergies  Allergen Reactions  . 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," as the patient is a Jehovah's Witness    Outpatient Encounter Medications as of 11/11/2019  Medication Sig  . acetaminophen (TYLENOL) 325 MG tablet Take 650 mg by mouth every 6 (six) hours as needed.   Marland Kitchen acetaminophen (TYLENOL) 325 MG tablet Take 650 mg by mouth in the morning.  Marland Kitchen amLODipine (NORVASC) 5 MG tablet Take 1 tablet (5 mg total) by mouth daily.  Marland Kitchen amoxicillin (AMOXIL) 500 MG capsule Take 2,000 mg by mouth as needed. Prior to dental procedure  . aspirin 81 MG EC tablet TAKE 1 TABLET (81 MG TOTAL) BY MOUTH DAILY. SWALLOW WHOLE.  Marland Kitchen atorvastatin (LIPITOR) 80 MG tablet Take 1 tablet (80 mg total) by mouth at bedtime.  . clopidogrel (PLAVIX) 75 MG tablet Take 1 tablet (75 mg total) by mouth daily with breakfast.  . diclofenac Sodium (VOLTAREN) 1 % GEL Apply 4 g topically 4 (four) times daily as needed (Pain). Apply to bilateral hips and knees  .  furosemide (LASIX) 80 MG tablet Take 80 mg by mouth 2 (two) times daily.  . hydrALAZINE (APRESOLINE) 50 MG tablet Take 1 tablet (50 mg total) by mouth 3 (three) times daily.  . Insulin Degludec-Liraglutide (XULTOPHY) 100-3.6 UNIT-MG/ML SOPN Inject 14 Units into the skin daily.  Marland Kitchen lactulose (CHRONULAC) 10 GM/15ML solution Take by mouth 2 (two) times daily. 30 ml  . metoprolol succinate (TOPROL-XL) 25 MG 24 hr tablet Take 1 tablet (25 mg total) by mouth daily.  Marland Kitchen PARoxetine (PAXIL) 30 MG tablet TAKE 1 TABLET BY MOUTH DAILY  . potassium chloride 20 MEQ TBCR Take 20 mEq by mouth daily.  Marland Kitchen EASY TOUCH PEN NEEDLES 31G X 5 MM MISC USE TO INJECT INSULIN INTO THE SKIN (Patient not taking: Reported on 10/11/2019)  . [DISCONTINUED] diclofenac Sodium (VOLTAREN) 1 % GEL APPLY 2 GRAMS TOPICALLY 4 TIMES DAILY   No facility-administered encounter medications on file as of 11/11/2019.    Review of Systems  GENERAL: No change in appetite, no fatigue, no weight changes,  no fever, chills or weakness MOUTH and THROAT: Denies oral discomfort, gingival pain or bleeding RESPIRATORY: no cough, SOB, DOE, wheezing, hemoptysis CARDIAC: No chest pain or palpitations GI: No abdominal pain, diarrhea, constipation, heart burn, nausea or vomiting GU: Denies dysuria, frequency, hematuria, incontinence, or discharge NEUROLOGICAL: Denies dizziness, syncope, numbness, or headache PSYCHIATRIC: Denies feelings of depression or anxiety. No report of hallucinations, insomnia, paranoia, or agitation   Immunization History  Administered Date(s) Administered  . Fluad Quad(high Dose 65+) 02/11/2019  . Influenza Split 11/12/2010, 11/10/2011  . Influenza Whole 11/07/2009  . Influenza,inj,Quad PF,6+ Mos 10/19/2012, 01/05/2014, 11/08/2015, 01/29/2017  . Moderna SARS-COVID-2 Vaccination 03/09/2019, 04/06/2019  . Pneumococcal Conjugate-13 06/07/2014  . Pneumococcal Polysaccharide-23 12/26/1994, 03/05/2010  . Td 09/24/1993  . Tdap  11/12/2010, 07/12/2013   Pertinent  Health Maintenance Due  Topic Date Due  . HEMOGLOBIN A1C  10/14/2019  . FOOT EXAM  02/24/2020 (Originally 07/10/2017)  . OPHTHALMOLOGY EXAM  02/24/2020 (Originally 11/07/2016)  . INFLUENZA VACCINE  05/24/2020 (Originally 09/25/2019)  . LIPID PANEL  02/07/2020  . DEXA SCAN  Completed  . PNA vac Low Risk Adult  Completed   Fall Risk  08/02/2019 06/16/2019 05/19/2019 05/04/2019 04/27/2019  Falls in the past year? $RemoveBe'1 1 1 'JJODLyxZL$ 0 0  Comment - pt denies-but dtr stated pt had a fall in last mths pt denies falls-but dtr states pt has had a fall - -  Number falls in past yr: 1 0 0 - 0  Injury with Fall? 0 0 0 - 0  Risk Factor Category  - - - - -  Risk for fall due to : Impaired mobility History of fall(s);Impaired balance/gait;Impaired mobility;Medication side effect History of fall(s);Impaired balance/gait;Impaired mobility;Medication side effect No Fall Risks -  Risk for fall due to: Comment - - - - -  Follow up Falls evaluation completed Falls evaluation completed;Falls prevention discussed Falls evaluation completed Falls evaluation completed -     Vitals:   11/11/19 1002  BP: 107/76  Pulse: 68  Resp: 20  Temp: 97.7 F (36.5 C)  TempSrc: Oral  Weight: 156 lb (70.8 kg)  Height: $Remove'5\' 5"'ANhpQqa$  (1.651 m)   Body mass index is 25.96 kg/m.  Physical Exam  GENERAL APPEARANCE: Well nourished. In no acute distress. Normal body habitus SKIN:  See HPI MOUTH and THROAT: Lips are without lesions. Oral mucosa is moist and without lesions. Tongue is normal in shape, size, and color and without lesions RESPIRATORY: Breathing is even & unlabored, BS CTAB CARDIAC: RRR, no murmur,no extra heart sounds, BLE trace edema. Right chest pacemaker GI: Abdomen soft, normal BS, no masses, no tenderness EXTREMITIES:  Able to move X 4 extremities NEUROLOGICAL: There is no tremor. Speech is clear. Alert and oriented X 3. PSYCHIATRIC:  Affect and behavior are appropriate  Labs reviewed: Recent  Labs    02/07/19 1323 02/07/19 1527 02/08/19 0556 02/09/19 0508 03/02/19 0253 05/04/19 1144 07/20/19 0301 07/20/19 0301 07/21/19 0356 08/02/19 0000 08/11/19 1638 08/11/19 1638 08/16/19 0000 08/23/19 1701 09/20/19 0000  NA 143   < > 145   < > 140   < > 137   < > 139   < > 139   < > 141 143 138  K 3.3*   < > 4.1   < > 3.7   < > 3.6   < > 3.6   < > 4.9   < > 4.1 4.4 4.2  CL 110   < > 109   < > 105   < >  98   < > 102   < > 98   < > 104 102 101  CO2 23   < > 27   < > 26   < > 27   < > 29   < > 27   < > 27* 25 23*  GLUCOSE 203*   < > 143*   < > 115*   < > 239*   < > 209*  --  171*  --   --  121*  --   BUN 25*   < > 26*   < > 24*   < > 26*   < > 20   < > 45*   < > 59* 44* 65*  CREATININE 1.48*   < > 1.58*   < > 1.47*   < > 1.56*   < > 1.35*   < > 1.70*  --  1.6* 1.62* 1.8*  CALCIUM 8.2*   < > 9.0   < > 8.8*   < > 8.6*   < > 8.5*   < > 9.0   < > 8.9 9.4 8.6*  MG 1.7   < > 2.3  --  1.7  --  1.8  --   --   --   --   --   --   --   --   PHOS 3.6  --   --   --   --   --   --   --   --   --   --   --   --   --   --    < > = values in this interval not displayed.   Recent Labs    02/07/19 1323 02/24/19 1020 07/17/19 2150  AST 13* 16 17  ALT $Re'12 13 13  'jLt$ ALKPHOS 96 125 141*  BILITOT 1.0 0.6 0.8  PROT 6.5 7.8 7.6  ALBUMIN 3.1* 3.4* 3.4*   Recent Labs    07/17/19 2150 07/17/19 2150 07/19/19 0219 07/19/19 0219 07/20/19 0723 07/20/19 0723 08/02/19 0000 08/08/19 0000 09/20/19 0000  WBC 18.9*   < > 16.1*   < > 9.2  --  9.1 7.7 8.0  NEUTROABS 17.3*  --   --   --   --   --  $R'6 5 5  'AO$ HGB 12.8   < > 10.3*   < > 10.3*   < > 9.5* 10.9* 11.0*  HCT 40.0   < > 32.2*   < > 32.4*   < > 29* 33* 34*  MCV 94.8  --  93.9  --  93.9  --   --   --   --   PLT 256   < > 184   < > 193   < > 314 299 246   < > = values in this interval not displayed.   Lab Results  Component Value Date   TSH 1.630 02/07/2019   Lab Results  Component Value Date   HGBA1C 9 07/14/2019   Lab Results  Component Value  Date   CHOL 99 02/07/2019   HDL 26 (L) 02/07/2019   LDLCALC 50 02/07/2019   TRIG 117 02/07/2019   CHOLHDL 3.8 02/07/2019    Assessment/Plan  1. Cellulitis of toe of right foot -  Will start Cephalexin 500 mg QID X 10 days and Florastor 250 mg BID X 13 days - start treatment : Cleanse right big toe with NS, apply xeroform and cover with dressing  daily till healed  2. Chronic diastolic congestive heart failure (HCC) - no SOB, continue Furosemide, Hydralazine and Metoprolol  3. Type 2 diabetes mellitus with stage 3b chronic kidney disease, with long-term current use of insulin [facility]) Lab Results  Component Value Date   HGBA1C 9 07/14/2019   - continue Xultophy   4. Essential hypertension - stable, continue Metoprolol, Amlodipine and Hydralazine - monitor BPs    Family/ staff Communication:  Discussed plan of care with resident and charge nurse.  Labs/tests ordered:  ABI on right foot  Goals of care:   Long-term care   Durenda Age, DNP, MSN, FNP-BC [facility] [phone] (Monday-Friday 8:00 a.m. - 5:00 p.m.) [phone] (after hours)

## 2019-11-24 ENCOUNTER — Encounter: Payer: Self-pay | Admitting: Podiatry

## 2019-11-24 ENCOUNTER — Other Ambulatory Visit: Payer: Self-pay

## 2019-11-24 ENCOUNTER — Ambulatory Visit (INDEPENDENT_AMBULATORY_CARE_PROVIDER_SITE_OTHER): Payer: Medicare Other

## 2019-11-24 ENCOUNTER — Ambulatory Visit (INDEPENDENT_AMBULATORY_CARE_PROVIDER_SITE_OTHER): Payer: Medicare Other | Admitting: Podiatry

## 2019-11-24 ENCOUNTER — Other Ambulatory Visit: Payer: Self-pay | Admitting: Podiatry

## 2019-11-24 DIAGNOSIS — L97511 Non-pressure chronic ulcer of other part of right foot limited to breakdown of skin: Secondary | ICD-10-CM

## 2019-11-24 DIAGNOSIS — M869 Osteomyelitis, unspecified: Secondary | ICD-10-CM | POA: Diagnosis not present

## 2019-11-24 DIAGNOSIS — S90211A Contusion of right great toe with damage to nail, initial encounter: Secondary | ICD-10-CM

## 2019-11-24 DIAGNOSIS — S90121A Contusion of right lesser toe(s) without damage to nail, initial encounter: Secondary | ICD-10-CM

## 2019-11-24 DIAGNOSIS — I2510 Atherosclerotic heart disease of native coronary artery without angina pectoris: Secondary | ICD-10-CM | POA: Diagnosis not present

## 2019-11-24 MED ORDER — MUPIROCIN 2 % EX OINT: TOPICAL_OINTMENT | CUTANEOUS | 1 refills | Status: DC

## 2019-11-24 MED ORDER — AMOXICILLIN-POT CLAVULANATE 875-125 MG PO TABS: 1.0000 | ORAL_TABLET | Freq: Two times a day (BID) | ORAL | 0 refills | Status: DC

## 2019-11-24 NOTE — Progress Notes (Signed)
Subjective:  Patient ID: [name], female    DOB: 1946-01-16,  MRN: [id number] HPI Chief Complaint  Patient presents with  . Toe Pain    Hallux right - wound tip of toe x approx 2 weeks, "I don't know what happened, they said I broke my toe", been on 2 antibiotics, toe is wrapped today  . Diabetes    Last a1c was 9.0    74 y.o. female presents with the above complaint.   ROS: Denies fever chills nausea vomiting muscle aches pains calf pain back pain chest pain shortness of breath.  Past Medical History:  Diagnosis Date  . Acute on chronic diastolic CHF (congestive heart failure) ([place]) 06/22/2017  . Acute on chronic renal failure ([place]) 05/25/2011  . Adenocarcinoma of breast ([place]) 1996   Completed tamoxifen and had mastectomy.  . Aortic stenosis, severe    s/p aortic valve replacement with porcine valve 06/2004.  ECHO 2010 EF 00%, LVH, diastolic dysfxn, Bioprostetic aoritc valve, mild AS. ECHO 2013 EF 60%, Nl aortic artificial valve, dynamic obstruction in the outflow tract   Class IIb rec for annual TTE after 5 yrs. She had a TTE 2013    . Arthritis   . CAD (coronary artery disease) 2006   s/p CABG (5/06) w/ saphenous vein to RCA at time of AVR  . CKD (chronic kidney disease) stage 2, GFR 60-89 ml/min 06/03/2011   She is no longer taking NSAIDs.   Marland Kitchen CVA (cerebral infarction) 2006   Post-op from AVR. Presumed embolic in nature. Carotid stenosis of R 60-79%. Repeat dopplers 4/10 no R stenosis and L stenosis of 1-29%.  . Depression    Controlled on Paxil  . Diabetes mellitus 1992   Dx 04/25/1990. Now insulin dependent, started 2008. On ACEI.   . Diverticulosis 2001  . History of 2019 novel coronavirus disease (COVID-19)   . Hyperlipidemia    Mgmt with a statin  . Hypertension    Requires 4 drug tx  . Osteoporosis 2006   DEXA 10/06 : L femur T -2.8, R -2.7. Lumbar T -2.4. On bisphosphonates and  Calcium / Vit D.  . Presence of permanent cardiac pacemaker   . Refusal of blood  transfusions as patient is Jehovah's Witness   . Stroke [facility])    Past Surgical History:  Procedure Laterality Date  . ABDOMINAL HYSTERECTOMY  1987   for fibroids  . AORTIC VALVE REPLACEMENT  2006  . CHOLECYSTECTOMY    . CORONARY ARTERY BYPASS GRAFT  2006   Saphenous vein to RCA at time of AVR. Course complicated by acute respiratory failure, post-op PTX, ARI, ileus, CVA  . MASTECTOMY Left 1995   L for adenocarcinoma  . PACEMAKER IMPLANT N/A 02/09/2019   Procedure: PACEMAKER IMPLANT;  Surgeon: Constance Haw, MD;  Location: [place] CV LAB;  Service: Cardiovascular;  Laterality: N/A;  . RIGHT HEART CATH AND CORONARY/GRAFT ANGIOGRAPHY N/A 02/09/2019   Procedure: RIGHT HEART CATH AND CORONARY/GRAFT ANGIOGRAPHY;  Surgeon: Sherren Mocha, MD;  Location: [place] CV LAB;  Service: Cardiovascular;  Laterality: N/A;  . TEE WITHOUT CARDIOVERSION N/A 07/20/2019   Procedure: TRANSESOPHAGEAL ECHOCARDIOGRAM (TEE);  Surgeon: Jerline Pain, MD;  [facility] ENDOSCOPY;  Service: Cardiovascular;  Laterality: N/A;  . TRANSCATHETER AORTIC VALVE REPLACEMENT, TRANSFEMORAL N/A 03/01/2019   Procedure: TRANSCATHETER AORTIC VALVE REPLACEMENT, TRANSFEMORAL;  Surgeon: Sherren Mocha, MD;  Location: [place] CV LAB;  Service: Open Heart Surgery;  Laterality: N/A;    Current Outpatient Medications:  .  acetaminophen (TYLENOL) 325 MG tablet, Take 650 mg by mouth every 6 (six) hours as needed. , Disp: , Rfl:  .  acetaminophen (TYLENOL) 325 MG tablet, Take 650 mg by mouth in the morning., Disp: , Rfl:  .  amLODipine (NORVASC) 5 MG tablet, Take 1 tablet (5 mg total) by mouth daily., Disp: 90 tablet, Rfl: 3 .  amoxicillin (AMOXIL) 500 MG capsule, Take 2,000 mg by mouth as needed. Prior to dental procedure, Disp: , Rfl:  .  amoxicillin-clavulanate (AUGMENTIN) 875-125 MG tablet, Take 1 tablet by mouth 2 (two) times daily., Disp: 28 tablet, Rfl: 0 .  aspirin 81 MG EC tablet, TAKE 1 TABLET (81 MG TOTAL) BY  MOUTH DAILY. SWALLOW WHOLE., Disp: 30 tablet, Rfl: 3 .  atorvastatin (LIPITOR) 80 MG tablet, Take 1 tablet (80 mg total) by mouth at bedtime., Disp: 90 tablet, Rfl: 3 .  clopidogrel (PLAVIX) 75 MG tablet, Take 1 tablet (75 mg total) by mouth daily with breakfast., Disp: 90 tablet, Rfl: 3 .  diclofenac Sodium (VOLTAREN) 1 % GEL, Apply 4 g topically 4 (four) times daily as needed (Pain). Apply to bilateral hips and knees, Disp: , Rfl:  .  EASY TOUCH PEN NEEDLES 31G X 5 MM MISC, USE TO INJECT INSULIN INTO THE SKIN (Patient not taking: Reported on 10/11/2019), Disp: , Rfl:  .  furosemide (LASIX) 80 MG tablet, Take 80 mg by mouth 2 (two) times daily., Disp: , Rfl:  .  hydrALAZINE (APRESOLINE) 50 MG tablet, Take 1 tablet (50 mg total) by mouth 3 (three) times daily., Disp: 270 tablet, Rfl: 0 .  Insulin Degludec-Liraglutide (XULTOPHY) 100-3.6 UNIT-MG/ML SOPN, Inject 14 Units into the skin daily., Disp: , Rfl:  .  lactulose (CHRONULAC) 10 GM/15ML solution, Take by mouth 2 (two) times daily. 30 ml, Disp: , Rfl:  .  metoprolol succinate (TOPROL-XL) 25 MG 24 hr tablet, Take 1 tablet (25 mg total) by mouth daily., Disp: 90 tablet, Rfl: 1 .  mupirocin ointment (BACTROBAN) 2 %, Apply to wound after soaking BID, Disp: 30 g, Rfl: 1 .  PARoxetine (PAXIL) 30 MG tablet, TAKE 1 TABLET BY MOUTH DAILY, Disp: 30 tablet, Rfl: 11 .  potassium chloride SA (KLOR-CON) 20 MEQ tablet, , Disp: , Rfl:   Allergies  Allergen Reactions  . 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," as the patient is a Jehovah's Witness   Review of Systems Objective:  There were no vitals filed for this visit.  General: Well developed, nourished, in no acute distress, alert and oriented x3   Dermatological: Skin is warm, dry and supple bilateral.  She has an ulcerative lesion to the distal aspect of the hallux right with skin breakdown and a deep wound that probes almost to bone.  She does have purulence and malodor from this ulcerative  lesion at the distal aspect of her toe her toenail is practically off which I went ahead and removed in total today.  There is no cellulitis noted.  I did take a sample of the drainage for culture and sensitivity.  There are no open sores, no preulcerative lesions, no rash or signs of infection present.  Vascular: Dorsalis Pedis artery and Posterior Tibial artery pedal pulses are 0/4 bilaterally bilateral with delayed capillary fill time.  Feet are cool to the touch pedal hair growth is not present. No varicosities and no lower extremity edema present bilateral.   Neruologic: Grossly intact via light touch bilateral. Vibratory intact via tuning fork bilateral.  Protective threshold with Semmes Wienstein monofilament intact to all pedal sites bilateral. Patellar and Achilles deep tendon reflexes 2+ bilateral. No Babinski or clonus noted bilateral.   Musculoskeletal: No gross boney pedal deformities bilateral. No pain, crepitus, or limitation noted with foot and ankle range of motion bilateral. Muscular strength 5/5 in all groups tested bilateral.  Gait: She presents in a wheelchair   Radiographs:  Radiographs taken today demonstrate osteolysis to the distal aspect of the toe it appears to be that there is osteomyelitis and skin breakdown of the distal phalanx of the hallux right.  There is considerable arterial calcifications.  And osteoporosis at the very least osteopenia.  I do not see any gas in the tissues.  Assessment & Plan:   Assessment: Severe vascular disease bilaterally plate.  Resulting in ulcerative lesion to the distal aspect of the hallux right most likely osteomyelitis.  Plan: At this point we are going to start her on Augmentin 875 mg 1 p.o.twice daily as long she does not have any GI upset and we are going to discontinue the use of the Keflex.  I also am going to start her on Bactroban ointment to be applied as a dressing once a day after being cleaned by home health.  And we are  going to request CBC and a complete metabolic profile as well as a Darco shoe.  We are also requesting vascular evaluation for ABIs expected to be noncompressible vessels.  I am going to refer her to Dr. Cannon Kettle for further care.  I expect this is going require amputation or possible grafting.  She was instructed to go directly to the hospital should she start to develop fever chills nausea vomiting or worsening of the pain.     Stanton Kissoon [address] [place], [place]

## 2019-11-25 LAB — HEPATIC FUNCTION PANEL
ALT: 15 (ref 7–35)
AST: 14 (ref 13–35)
Alkaline Phosphatase: 140 — AB (ref 25–125)
Bilirubin, Total: 0.2

## 2019-11-25 LAB — BASIC METABOLIC PANEL
BUN: 27 — AB (ref 4–21)
CO2: 26 — AB (ref 13–22)
Chloride: 107 (ref 99–108)
Creatinine: 1.6 — AB (ref 0.5–1.1)
Glucose: 86
Potassium: 4.4 (ref 3.4–5.3)
Sodium: 142 (ref 137–147)

## 2019-11-25 LAB — COMPREHENSIVE METABOLIC PANEL
Albumin: 2.9 — AB (ref 3.5–5.0)
Calcium: 8.6 — AB (ref 8.7–10.7)
GFR calc Af Amer: 34.88
GFR calc non Af Amer: 30.09
Globulin: 2.8

## 2019-11-25 LAB — CBC AND DIFFERENTIAL
HCT: 31 — AB (ref 36–46)
Hemoglobin: 10.3 — AB (ref 12.0–16.0)
Neutrophils Absolute: 4
Platelets: 254 (ref 150–399)
WBC: 6.9

## 2019-11-25 LAB — CBC: RBC: 3.49 — AB (ref 3.87–5.11)

## 2019-11-28 ENCOUNTER — Ambulatory Visit: Payer: Medicare Other | Admitting: *Deleted

## 2019-11-28 DIAGNOSIS — I5032 Chronic diastolic (congestive) heart failure: Secondary | ICD-10-CM

## 2019-11-28 DIAGNOSIS — N1832 Chronic kidney disease, stage 3b: Secondary | ICD-10-CM

## 2019-11-28 DIAGNOSIS — Z794 Long term (current) use of insulin: Secondary | ICD-10-CM

## 2019-11-28 DIAGNOSIS — E1122 Type 2 diabetes mellitus with diabetic chronic kidney disease: Secondary | ICD-10-CM

## 2019-11-28 DIAGNOSIS — I10 Essential (primary) hypertension: Secondary | ICD-10-CM

## 2019-11-28 LAB — WOUND CULTURE
MICRO NUMBER:: 11015797
SPECIMEN QUALITY:: ADEQUATE

## 2019-11-28 NOTE — Progress Notes (Signed)
Internal Medicine Clinic Resident  I have personally reviewed this encounter including the documentation in this note and/or discussed this patient with the care management provider. I will address any urgent items identified by the care management provider and will communicate my actions to the patient's PCP. I have reviewed the patient's CCM visit with my supervising attending, Dr Mullen.  Starlett Pehrson, MD  [facility] PGY-2 11/28/2019    

## 2019-11-28 NOTE — Chronic Care Management (AMB) (Signed)
Chronic Care Management   Follow Up Note   11/28/2019 Name: [name] MRN: [id number] DOB: 05/02/1945  Referred by: Hendricks Limes, MD Reason for referral : Chronic Care Management (HTN, HLD, HF, CAD, HTN, CKD, s/p CVA)   [name] is a 74 y.o. year old female who is a primary care patient of Linna Darner, Darrick Penna, MD. The CCM team was consulted for assistance with chronic disease management and care coordination needs.    Review of patient status, including review of consultants reports, relevant laboratory and other test results, and collaboration with appropriate care team members and the patient's provider was performed as part of comprehensive patient evaluation and provision of chronic care management services.    SDOH (Social Determinants of Health) assessments performed: No See Care Plan activities for detailed interventions related to [facility])     Outpatient Encounter Medications as of 11/28/2019  Medication Sig  . acetaminophen (TYLENOL) 325 MG tablet Take 650 mg by mouth every 6 (six) hours as needed.   Marland Kitchen acetaminophen (TYLENOL) 325 MG tablet Take 650 mg by mouth in the morning.  Marland Kitchen amLODipine (NORVASC) 5 MG tablet Take 1 tablet (5 mg total) by mouth daily.  Marland Kitchen amoxicillin (AMOXIL) 500 MG capsule Take 2,000 mg by mouth as needed. Prior to dental procedure  . amoxicillin-clavulanate (AUGMENTIN) 875-125 MG tablet Take 1 tablet by mouth 2 (two) times daily.  Marland Kitchen aspirin 81 MG EC tablet TAKE 1 TABLET (81 MG TOTAL) BY MOUTH DAILY. SWALLOW WHOLE.  Marland Kitchen atorvastatin (LIPITOR) 80 MG tablet Take 1 tablet (80 mg total) by mouth at bedtime.  . clopidogrel (PLAVIX) 75 MG tablet Take 1 tablet (75 mg total) by mouth daily with breakfast.  . diclofenac Sodium (VOLTAREN) 1 % GEL Apply 4 g topically 4 (four) times daily as needed (Pain). Apply to bilateral hips and knees  . EASY TOUCH PEN NEEDLES 31G X 5 MM MISC USE TO INJECT INSULIN INTO THE SKIN (Patient not taking: Reported on 10/11/2019)   . furosemide (LASIX) 80 MG tablet Take 80 mg by mouth 2 (two) times daily.  . hydrALAZINE (APRESOLINE) 50 MG tablet Take 1 tablet (50 mg total) by mouth 3 (three) times daily.  . Insulin Degludec-Liraglutide (XULTOPHY) 100-3.6 UNIT-MG/ML SOPN Inject 14 Units into the skin daily.  Marland Kitchen lactulose (CHRONULAC) 10 GM/15ML solution Take by mouth 2 (two) times daily. 30 ml  . metoprolol succinate (TOPROL-XL) 25 MG 24 hr tablet Take 1 tablet (25 mg total) by mouth daily.  . mupirocin ointment (BACTROBAN) 2 % Apply to wound after soaking BID  . PARoxetine (PAXIL) 30 MG tablet TAKE 1 TABLET BY MOUTH DAILY  . potassium chloride SA (KLOR-CON) 20 MEQ tablet    No facility-administered encounter medications on file as of 11/28/2019.     Objective:  Wt Readings from Last 3 Encounters:  11/11/19 156 lb (70.8 kg)  10/19/19 163 lb 3.2 oz (74 kg)  10/17/19 163 lb 3.2 oz (74 kg)   BP Readings from Last 3 Encounters:  11/11/19 107/76  10/19/19 112/66  10/17/19 130/82    Goals Addressed              This Visit's Progress     Patient Stated   .  COMPLETED: "I can weigh myself without help and I write it down but I don't keep the paper with the weights on it after a while." (pt-stated)        CARE PLAN ENTRY (see longitudinal plan of care  for additional care plan information)  Current Barriers:  . Chronic Disease Management support, education, and care coordination needs related to CHF, CAD, HTN, DMII, and CKD Stage 3 - patient is now a permanent resident of [facility] with Unice Cobble as her primary care provider so will close to CCM services at the [facility]) related to CHF, CAD, HTN, DMII, and CKD Stage 3 :  Over the next 30 days, patient will:  . Work with the care management team to address educational, disease management, and care coordination needs  . Begin or continue self health monitoring activities as directed today Measure and record CBG  (blood glucose) at least 1 times daily  . Measure and record weight daily . Call provider office for new or worsened signs and symptoms Weight of blood sugars outside established parameters . Call care management team with questions or concerns . Verbalize basic understanding of patient centered plan of care established today  Interventions related to CHF, CAD, HTN, DMII, and CKD Stage 3 :  . Closing referral as patient is no longer an [facility] patient  Patient Self Care Activities related to CHF, CAD, HTN, DMII, and CKD Stage 3 :  . Patient is unable to independently self-manage chronic health conditions  Please see past updates related to this goal by clicking on the "Past Updates" button in the selected goal          Plan:   No further follow up required: patient is permanent resident of [facility] and no longer an [facility] patient   Kelli Churn RN, CCM, [facility] RN Care Manager [phone]

## 2019-11-28 NOTE — Progress Notes (Signed)
[facility] Medicine Clinic Attending  CCM services provided by the care management provider and their documentation were discussed with Dr. Marva Panda. We reviewed the pertinent findings, urgent action items addressed by the resident and non-urgent items to be addressed by the PCP.  I agree with the assessment, diagnosis, and plan of care documented in the CCM and resident's note.  Gilles Chiquito, MD 11/28/2019

## 2019-11-29 ENCOUNTER — Ambulatory Visit (HOSPITAL_COMMUNITY)
Admission: RE | Admit: 2019-11-29 | Discharge: 2019-11-29 | Disposition: A | Discharge status: Home / Self Care | Payer: Medicare Other | Source: Ambulatory Visit | Attending: Podiatry | Admitting: Podiatry

## 2019-11-29 ENCOUNTER — Other Ambulatory Visit: Payer: Self-pay

## 2019-11-29 DIAGNOSIS — M869 Osteomyelitis, unspecified: Secondary | ICD-10-CM | POA: Insufficient documentation

## 2019-11-29 DIAGNOSIS — L97511 Non-pressure chronic ulcer of other part of right foot limited to breakdown of skin: Secondary | ICD-10-CM | POA: Diagnosis present

## 2019-12-01 ENCOUNTER — Other Ambulatory Visit: Payer: Self-pay

## 2019-12-01 ENCOUNTER — Ambulatory Visit (INDEPENDENT_AMBULATORY_CARE_PROVIDER_SITE_OTHER): Payer: Medicare Other | Admitting: Sports Medicine

## 2019-12-01 ENCOUNTER — Encounter: Payer: Self-pay | Admitting: Sports Medicine

## 2019-12-01 DIAGNOSIS — I2510 Atherosclerotic heart disease of native coronary artery without angina pectoris: Secondary | ICD-10-CM

## 2019-12-01 DIAGNOSIS — I739 Peripheral vascular disease, unspecified: Secondary | ICD-10-CM | POA: Diagnosis not present

## 2019-12-01 DIAGNOSIS — M869 Osteomyelitis, unspecified: Secondary | ICD-10-CM

## 2019-12-01 DIAGNOSIS — E0800 Diabetes mellitus due to underlying condition with hyperosmolarity without nonketotic hyperglycemic-hyperosmolar coma (NKHHC): Secondary | ICD-10-CM | POA: Diagnosis not present

## 2019-12-01 DIAGNOSIS — L97511 Non-pressure chronic ulcer of other part of right foot limited to breakdown of skin: Secondary | ICD-10-CM | POA: Diagnosis not present

## 2019-12-01 MED ORDER — GABAPENTIN 100 MG PO CAPS: 100.0000 mg | ORAL_CAPSULE | Freq: Every day | ORAL | 1 refills | Status: DC

## 2019-12-01 MED ORDER — CIPROFLOXACIN HCL 500 MG PO TABS: 500.0000 mg | ORAL_TABLET | Freq: Two times a day (BID) | ORAL | 0 refills | Status: DC

## 2019-12-01 NOTE — Progress Notes (Signed)
Subjective: [name] is a 74 y.o. female patient seen in office for evaluation of ulceration of the right great toe.  Patient is from [facility] living skilled nursing facility and does not recall how she got the ulcer on her toe.  Patient reports that she is taking antibiotics and the facility is dressing the toe and knee issues but does admit to increased numbness tingling and burning pain to the feet.  Denies nausea vomiting fever chills or any other constitutional symptoms at this time.  Patient Active Problem List   Diagnosis Date Noted  . Skin pustule 10/20/2019  . Neurocognitive deficits 08/04/2019  . Controlled type 2 diabetes mellitus without complication ([place]) 71/16/5790  . CVA (cerebral vascular accident) ([place]) 07/23/2019  . Infection of pacemaker lead wire ([place]) 07/21/2019  . Fever   . History of transcatheter aortic valve replacement (TAVR)   . Septic encephalopathy 07/18/2019  . Sacral decubitus ulcer, stage II ([place]) 07/18/2019  . Bacteremia due to group B Streptococcus 07/18/2019  . Sepsis ([place]) 07/18/2019  . Complete heart block ([place])   . Chronic diastolic congestive heart failure ([place]) 02/07/2019  . AKI (acute kidney injury) ([place]) 07/22/2017  . Peripheral arterial disease ([place]) 06/18/2017  . B12 deficiency 05/01/2017  . Bilateral lower extremity edema 11/08/2015  . Aortic atherosclerosis ([place]) 06/07/2014  . Abnormality of gait 05/29/2012  . Constipation 03/16/2012  . HOCM (hypertrophic obstructive cardiomyopathy) ([place]) 06/11/2011  . Routine health maintenance 06/10/2010  . Hyperlipidemia   . Refusal of blood transfusions as patient is Jehovah's Witness   . History of adenocarcinoma of breast   . Osteoporosis   . Status post CVA   . Aortic stenosis s/p Tissure AVR 2006   . CAD (coronary artery disease)   . Hypertension   . Depression 01/23/2006  . DM (diabetes mellitus) ([place]) 03/25/1990  . Benign hypertensive heart and kidney disease with diastolic CHF,  NYHA class 3 and CKD stage 3 ([place]) 1992  . Bradycardia 1992   Current Outpatient Medications on File Prior to Visit  Medication Sig Dispense Refill  . acetaminophen (TYLENOL) 325 MG tablet Take 650 mg by mouth every 6 (six) hours as needed.     Marland Kitchen acetaminophen (TYLENOL) 325 MG tablet Take 650 mg by mouth in the morning.    Marland Kitchen amLODipine (NORVASC) 5 MG tablet Take 1 tablet (5 mg total) by mouth daily. 90 tablet 3  . amoxicillin (AMOXIL) 500 MG capsule Take 2,000 mg by mouth as needed. Prior to dental procedure    . amoxicillin-clavulanate (AUGMENTIN) 875-125 MG tablet Take 1 tablet by mouth 2 (two) times daily. 28 tablet 0  . aspirin 81 MG EC tablet TAKE 1 TABLET (81 MG TOTAL) BY MOUTH DAILY. SWALLOW WHOLE. 30 tablet 3  . atorvastatin (LIPITOR) 80 MG tablet Take 1 tablet (80 mg total) by mouth at bedtime. 90 tablet 3  . clopidogrel (PLAVIX) 75 MG tablet Take 1 tablet (75 mg total) by mouth daily with breakfast. 90 tablet 3  . diclofenac Sodium (VOLTAREN) 1 % GEL Apply 4 g topically 4 (four) times daily as needed (Pain). Apply to bilateral hips and knees    . EASY TOUCH PEN NEEDLES 31G X 5 MM MISC USE TO INJECT INSULIN INTO THE SKIN (Patient not taking: Reported on 10/11/2019)    . furosemide (LASIX) 80 MG tablet Take 80 mg by mouth 2 (two) times daily.    . hydrALAZINE (APRESOLINE) 50 MG tablet Take 1 tablet (50 mg total) by mouth  3 (three) times daily. 270 tablet 0  . Insulin Degludec-Liraglutide (XULTOPHY) 100-3.6 UNIT-MG/ML SOPN Inject 14 Units into the skin daily.    Marland Kitchen lactulose (CHRONULAC) 10 GM/15ML solution Take by mouth 2 (two) times daily. 30 ml    . metoprolol succinate (TOPROL-XL) 25 MG 24 hr tablet Take 1 tablet (25 mg total) by mouth daily. 90 tablet 1  . mupirocin ointment (BACTROBAN) 2 % Apply to wound after soaking BID 30 g 1  . PARoxetine (PAXIL) 30 MG tablet TAKE 1 TABLET BY MOUTH DAILY 30 tablet 11  . potassium chloride SA (KLOR-CON) 20 MEQ tablet      No current  facility-administered medications on file prior to visit.   Allergies  Allergen Reactions  . 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," as the patient is a Sales promotion account executive Witness    Recent Results (from the past 2160 hour(s))  CBC and differential     Status: Abnormal   Collection Time: 09/20/19 12:00 AM  Result Value Ref Range   Hemoglobin 11.0 (A) 12.0 - 16.0   HCT 34 (A) 36 - 46   Neutrophils Absolute 5    Platelets 246 150 - 399   WBC 8.0   CBC     Status: Abnormal   Collection Time: 09/20/19 12:00 AM  Result Value Ref Range   RBC 3.76 (A) 3.87 - 7.86  Basic metabolic panel     Status: Abnormal   Collection Time: 09/20/19 12:00 AM  Result Value Ref Range   Glucose 153    BUN 65 (A) 4 - 21   CO2 23 (A) 13 - 22   Creatinine 1.8 (A) 0.5 - 1.1   Potassium 4.2 3.4 - 5.3   Sodium 138 137 - 147   Chloride 101 99 - 108  Comprehensive metabolic panel     Status: Abnormal   Collection Time: 09/20/19 12:00 AM  Result Value Ref Range   GFR calc Af Amer 32.28    GFR calc non Af Amer 27.85    Calcium 8.6 (A) 8.7 - 10.7  HM HEPATITIS C SCREENING LAB     Status: None   Collection Time: 10/17/19 12:00 AM  Result Value Ref Range   HM Hepatitis Screen Negative-Validated     Comment: 0.08  HM HEPATITIS C SCREENING LAB     Status: None   Collection Time: 10/17/19 12:00 AM  Result Value Ref Range   HM Hepatitis Screen Negative-Validated     Comment: 0.08  WOUND CULTURE     Status: Abnormal   Collection Time: 11/24/19  9:03 AM   Specimen: Wound  Result Value Ref Range   MICRO NUMBER: [id number]    SPECIMEN QUALITY: Adequate    SOURCE: RIGHT HALLUX    STATUS: FINAL    GRAM STAIN:      Moderate White blood cells seen No epithelial cells seen Many Gram positive cocci in pairs Many Gram negative bacilli   ISOLATE 1: Morganella morganii (A)     Comment: Heavy growth of Morganella morganii   ISOLATE 2: Enterobacter cloacae (A)     Comment: Heavy growth of Enterobacter cloacae       Susceptibility   Enterobacter cloacae - AEROBIC CULT, GRAM STAIN NEGATIVE 2    AMOX/CLAVULANIC 8 Resistant     CEFAZOLIN >=64 Resistant     CEFEPIME <=1 Sensitive     CEFTRIAXONE 8 Resistant     CIPROFLOXACIN <=0.25 Sensitive     LEVOFLOXACIN 1 Intermediate  ERTAPENEM <=0.5 Sensitive     GENTAMICIN >=16 Resistant     IMIPENEM <=0.25 Sensitive     PIP/TAZO <=4 Sensitive     TOBRAMYCIN 8 Intermediate     TRIMETH/SULFA* >=320 Resistant      * For infections other than uncomplicated UTIcaused by E. coli, K. pneumoniae or P. mirabilis:Cefazolin is resistant if MIC > or = 8 mcg/mL.(Distinguishing susceptible versus intermediatefor isolates with MIC < or = 4 mcg/mL requiresadditional testing.)Legend:S = Susceptible  I = IntermediateR = Resistant  NS = Not susceptible* = Not tested  NR = Not reported**NN = See antimicrobic comments   Morganella morganii - AEROBIC CULT, GRAM STAIN NEGATIVE 1    AMOX/CLAVULANIC >=32 Resistant     AMPICILLIN >=32 Resistant     AMPICILLIN/SULBACTAM >=32 Resistant     CEFAZOLIN* >=64 Resistant      * For infections other than uncomplicated UTIcaused by E. coli, K. pneumoniae or P. mirabilis:Cefazolin is resistant if MIC > or = 8 mcg/mL.(Distinguishing susceptible versus intermediatefor isolates with MIC < or = 4 mcg/mL requiresadditional testing.)    CEFEPIME <=1 Sensitive     CEFTRIAXONE <=1 Sensitive     CIPROFLOXACIN >=4 Resistant     LEVOFLOXACIN >=8 Resistant     ERTAPENEM <=0.5 Sensitive     GENTAMICIN <=1 Sensitive     PIP/TAZO <=4 Sensitive     TOBRAMYCIN <=1 Sensitive     TRIMETH/SULFA >=320 Resistant     Objective: There were no vitals filed for this visit.  General: Patient is awake, alert, oriented x 3 and in no acute distress.  Dermatology: Skin is warm and dry bilateral with a full thickness ulceration present distal tuft of the right great toe  Ulceration measures 2 cm x 1 cm x 0.4 cm. There is a  Keratotic border with a fibronecrotic  base. The ulceration does probe to bone. There is no malodor, bloody active drainage, no erythema, localized edema. No other acute signs of infection.   Vascular: Dorsalis Pedis pulse = 0/4 Bilateral,  Posterior Tibial pulse = 0/4 Bilateral,  Capillary Fill Time < 5 seconds  Neurologic: Protective sensation severely diminished with significant neuropathy pain worse at night.  Musculosketal: There is pain with palpation to ulcerated area.  Severe end-stage bunion deformity.  No pain with compression to calves bilateral.  ABIs reviewed consistent with noncompressible vessels on the right  Recent Labs    07/17/19 2150  LABORGA GROUP B STREP(S.AGALACTIAE)ISOLATED    Assessment and Plan:  Problem List Items Addressed This Visit      Endocrine   DM (diabetes mellitus) ([place])   Relevant Orders   Ambulatory referral to Vascular Surgery    Other Visit Diagnoses    Skin ulcer of right great toe, limited to breakdown of skin ([place])    -  Primary   Relevant Orders   Ambulatory referral to Vascular Surgery   Osteomyelitis of great toe of right foot [facility])       Relevant Orders   Ambulatory referral to Vascular Surgery   PAD (peripheral artery disease) ([place])       Relevant Orders   Ambulatory referral to Vascular Surgery       -Examined patient and discussed the progression of the wound and treatment alternatives. -ABIs reviewed -Cleansed ulceration.  Very gently with a saline moistened gauze debrided any loose tissue to the area at the right great toe.  Hemostasis was achieved with manuel pressure. Patient tolerated nonselective debridement procedure well without any  discomfort or anesthesia necessary for this wound debridement.  -Applied Betadine and dry sterile dressing and instructed patient to continue with daily dressings at home consisting of same or antibiotic cream at the facility daily -Added on ciprofloxacin for positive culture of Enterococcus -Added on gabapentin 100 mg at  bedtime for increased neuropathy pain -Order set for patient to see vascular surgery for further evaluation of healing of the toe in the setting of ulcer with concern for osteomyelitis and PAD - Advised patient to go to the ER or return to office if the wound worsens or if constitutional symptoms are present. -Patient to return to office in 1 week for follow up care and evaluation or sooner if problems arise.  Landis Martins, DPM

## 2019-12-02 ENCOUNTER — Encounter: Payer: Self-pay | Admitting: Adult Health

## 2019-12-02 ENCOUNTER — Non-Acute Institutional Stay (SKILLED_NURSING_FACILITY): Payer: Medicare Other | Admitting: Adult Health

## 2019-12-02 DIAGNOSIS — E1122 Type 2 diabetes mellitus with diabetic chronic kidney disease: Secondary | ICD-10-CM | POA: Diagnosis not present

## 2019-12-02 DIAGNOSIS — Z794 Long term (current) use of insulin: Secondary | ICD-10-CM

## 2019-12-02 DIAGNOSIS — G629 Polyneuropathy, unspecified: Secondary | ICD-10-CM | POA: Diagnosis not present

## 2019-12-02 DIAGNOSIS — M869 Osteomyelitis, unspecified: Secondary | ICD-10-CM | POA: Diagnosis not present

## 2019-12-02 DIAGNOSIS — N1832 Chronic kidney disease, stage 3b: Secondary | ICD-10-CM | POA: Diagnosis not present

## 2019-12-02 NOTE — Progress Notes (Signed)
Location:  [facility] Room Number: 217-A Place of Service:  SNF (31) Provider:  Kenard Gower, DNP, FNP-BC  Patient Care Team: Pecola Lawless, MD as PCP - General (Internal Medicine) Lars Masson, MD as PCP - Cardiology (Cardiology) Regan Lemming, MD as PCP - Electrophysiology (Cardiology) Medina-Vargas, Margit Banda, NP as Nurse Practitioner (Internal Medicine) Rehab, [facility])  Extended Emergency Contact Information Primary Emergency Contact: Malen Gauze [place] Home Phone: [phone] Mobile Phone: [phone] Relation: Daughter Secondary Emergency Contact: Armstrong,[name]  [place] Home Phone: [phone] Relation: Daughter  Code Status:  FULL CODE  Goals of care: Advanced Directive information Advanced Directives 11/11/2019  Does Patient Have a Medical Advance Directive? Yes  Type of Advance Directive -  Does patient want to make changes to medical advance directive? No - Patient declined  Copy of Healthcare Power of Attorney in Chart? -  Would patient like information on creating a medical advance directive? -  Pre-existing out of facility DNR order (yellow form or pink MOST form) -     Chief Complaint  Patient presents with  . Acute Visit    Patient is seen for right big toe osteomyelitis    HPI:  Pt is a 74 y.o. female seen today for right big toe osteomyelitis. She is a long-term care resident of [facility]. She has a PMH of essential hypertension, dyslipidemia, history of stroke, CKD, CAD, severe aortic stenosis, history of breast cancer and history of chronic diastolic CHF. She was recently diagnosed with right 1st toe osteomyelitis. He was initially started on Cephalexin then shifted to Augmentin and added Cipro for enterococcus infection. Podiatry started Gabapentin for neuropathy and recommended Vascular surgery  consult due to abnormal ABI. She was seen in the room today. CBGs ranging from 78 to 214. She is currently taking Xultophy 14 units injections daily for diabetes mellitus.    Past Medical History:  Diagnosis Date  . Acute on chronic diastolic CHF (congestive heart failure) (HCC) 06/22/2017  . Acute on chronic renal failure (HCC) 05/25/2011  . Adenocarcinoma of breast (HCC) 1996   Completed tamoxifen and had mastectomy.  . Aortic stenosis, severe    s/p aortic valve replacement with porcine valve 06/2004.  ECHO 2010 EF 65%, LVH, diastolic dysfxn, Bioprostetic aoritc valve, mild AS. ECHO 2013 EF 60%, Nl aortic artificial valve, dynamic obstruction in the outflow tract   Class IIb rec for annual TTE after 5 yrs. She had a TTE 2013    . Arthritis   . CAD (coronary artery disease) 2006   s/p CABG (5/06) w/ saphenous vein to RCA at time of AVR  . CKD (chronic kidney disease) stage 2, GFR 60-89 ml/min 06/03/2011   She is no longer taking NSAIDs.   Marland Kitchen CVA (cerebral infarction) 2006   Post-op from AVR. Presumed embolic in nature. Carotid stenosis of R 60-79%. Repeat dopplers 4/10 no R stenosis and L stenosis of 1-29%.  . Depression    Controlled on Paxil  . Diabetes mellitus 1992   Dx 04/25/1990. Now insulin dependent, started 2008. On ACEI.   . Diverticulosis 2001  . History of 2019 novel coronavirus disease (COVID-19)   . Hyperlipidemia    Mgmt with a statin  . Hypertension    Requires 4 drug tx  . Osteoporosis 2006   DEXA 10/06 : L femur T -2.8, R -2.7. Lumbar T -2.4. On bisphosphonates and  Calcium / Vit  D.  . Presence of permanent cardiac pacemaker   . Refusal of blood transfusions as patient is Jehovah's Witness   . Stroke [facility])    Past Surgical History:  Procedure Laterality Date  . ABDOMINAL HYSTERECTOMY  1987   for fibroids  . AORTIC VALVE REPLACEMENT  2006  . CHOLECYSTECTOMY    . CORONARY ARTERY BYPASS GRAFT  2006   Saphenous vein to RCA at time of AVR. Course complicated by acute  respiratory failure, post-op PTX, ARI, ileus, CVA  . MASTECTOMY Left 1995   L for adenocarcinoma  . PACEMAKER IMPLANT N/A 02/09/2019   Procedure: PACEMAKER IMPLANT;  Surgeon: Constance Haw, MD;  Location: [place] [facility];  Service: Cardiovascular;  Laterality: N/A;  . RIGHT HEART CATH AND CORONARY/GRAFT ANGIOGRAPHY N/A 02/09/2019   Procedure: RIGHT HEART CATH AND CORONARY/GRAFT ANGIOGRAPHY;  Surgeon: Sherren Mocha, MD;  Location: [place] [facility];  Service: Cardiovascular;  Laterality: N/A;  . TEE WITHOUT CARDIOVERSION N/A 07/20/2019   Procedure: TRANSESOPHAGEAL ECHOCARDIOGRAM (TEE);  Surgeon: Jerline Pain, MD;  Location: [facility] ENDOSCOPY;  Service: Cardiovascular;  Laterality: N/A;  . TRANSCATHETER AORTIC VALVE REPLACEMENT, TRANSFEMORAL N/A 03/01/2019   Procedure: TRANSCATHETER AORTIC VALVE REPLACEMENT, TRANSFEMORAL;  Surgeon: Sherren Mocha, MD;  Location: [place] [facility];  Service: Open Heart Surgery;  Laterality: N/A;    Allergies  Allergen Reactions  . 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," as the patient is a Jehovah's Witness    Outpatient Encounter Medications as of 12/02/2019  Medication Sig  . acetaminophen (TYLENOL) 325 MG tablet Take 650 mg by mouth every 6 (six) hours as needed.   Marland Kitchen acetaminophen (TYLENOL) 325 MG tablet Take 650 mg by mouth in the morning.  Marland Kitchen amLODipine (NORVASC) 5 MG tablet Take 1 tablet (5 mg total) by mouth daily.  Marland Kitchen amoxicillin (AMOXIL) 500 MG capsule Take 2,000 mg by mouth as needed. Prior to dental procedure  . amoxicillin-clavulanate (AUGMENTIN) 875-125 MG tablet Take 1 tablet by mouth daily.  Marland Kitchen aspirin 81 MG EC tablet TAKE 1 TABLET (81 MG TOTAL) BY MOUTH DAILY. SWALLOW WHOLE.  Marland Kitchen atorvastatin (LIPITOR) 80 MG tablet Take 1 tablet (80 mg total) by mouth at bedtime.  . ciprofloxacin (CIPRO) 500 MG tablet Take 1 tablet (500 mg total) by mouth 2 (two) times daily.  . clopidogrel (PLAVIX) 75 MG tablet Take 1 tablet (75 mg total) by  mouth daily with breakfast.  . diclofenac Sodium (VOLTAREN) 1 % GEL Apply 4 g topically 4 (four) times daily as needed (Pain). Apply to bilateral hips and knees  . furosemide (LASIX) 80 MG tablet Take 80 mg by mouth 2 (two) times daily.  Marland Kitchen gabapentin (NEURONTIN) 100 MG capsule Take 1 capsule (100 mg total) by mouth at bedtime.  . hydrALAZINE (APRESOLINE) 50 MG tablet Take 1 tablet (50 mg total) by mouth 3 (three) times daily.  . Insulin Degludec-Liraglutide (XULTOPHY) 100-3.6 UNIT-MG/ML SOPN Inject 14 Units into the skin daily.  Marland Kitchen lactulose (CHRONULAC) 10 GM/15ML solution Take by mouth 2 (two) times daily. 30 ml  . metoprolol succinate (TOPROL-XL) 25 MG 24 hr tablet Take 1 tablet (25 mg total) by mouth daily.  . mupirocin ointment (BACTROBAN) 2 % Apply to wound after soaking BID  . PARoxetine (PAXIL) 30 MG tablet TAKE 1 TABLET BY MOUTH DAILY  . potassium chloride SA (KLOR-CON) 20 MEQ tablet Take 20 mEq by mouth daily.   Marland Kitchen EASY TOUCH PEN NEEDLES 31G X 5 MM MISC USE  TO INJECT INSULIN INTO THE SKIN (Patient not taking: Reported on 10/11/2019)  . [DISCONTINUED] amoxicillin-clavulanate (AUGMENTIN) 875-125 MG tablet Take 1 tablet by mouth 2 (two) times daily.   No facility-administered encounter medications on file as of 12/02/2019.    Review of Systems  GENERAL: No change in appetite, no fatigue, no weight changes, no fever, chills or weakness MOUTH and THROAT: Denies oral discomfort, gingival pain or bleeding, pain from teeth or hoarseness   RESPIRATORY: no cough, SOB, DOE, wheezing, hemoptysis CARDIAC: No chest pain, edema or palpitations GI: No abdominal pain, diarrhea, constipation, heart burn, nausea or vomiting GU: Denies dysuria, frequency, hematuria, incontinence, or discharge NEUROLOGICAL: Denies dizziness, syncope, numbness, or headache PSYCHIATRIC: Denies feelings of depression or anxiety. No report of hallucinations, insomnia, paranoia, or agitation   Immunization History    Administered Date(s) Administered  . Fluad Quad(high Dose 65+) 02/11/2019  . Influenza Split 11/12/2010, 11/10/2011  . Influenza Whole 11/07/2009  . Influenza,inj,Quad PF,6+ Mos 10/19/2012, 01/05/2014, 11/08/2015, 01/29/2017  . Moderna SARS-COVID-2 Vaccination 03/09/2019, 04/06/2019  . Pneumococcal Conjugate-13 06/07/2014  . Pneumococcal Polysaccharide-23 12/26/1994, 03/05/2010  . Td 09/24/1993  . Tdap 11/12/2010, 07/12/2013   Pertinent  Health Maintenance Due  Topic Date Due  . HEMOGLOBIN A1C  10/14/2019  . FOOT EXAM  02/24/2020 (Originally 07/10/2017)  . OPHTHALMOLOGY EXAM  02/24/2020 (Originally 11/07/2016)  . INFLUENZA VACCINE  05/24/2020 (Originally 09/25/2019)  . LIPID PANEL  02/07/2020  . DEXA SCAN  Completed  . PNA vac Low Risk Adult  Completed   Fall Risk  08/02/2019 06/16/2019 05/19/2019 05/04/2019 04/27/2019  Falls in the past year? $RemoveBe'1 1 1 'XYsCLhxZV$ 0 0  Comment - pt denies-but dtr stated pt had a fall in last mths pt denies falls-but dtr states pt has had a fall - -  Number falls in past yr: 1 0 0 - 0  Injury with Fall? 0 0 0 - 0  Risk Factor Category  - - - - -  Risk for fall due to : Impaired mobility History of fall(s);Impaired balance/gait;Impaired mobility;Medication side effect History of fall(s);Impaired balance/gait;Impaired mobility;Medication side effect No Fall Risks -  Risk for fall due to: Comment - - - - -  Follow up Falls evaluation completed Falls evaluation completed;Falls prevention discussed Falls evaluation completed Falls evaluation completed -     Vitals:   12/02/19 1118  BP: 133/74  Pulse: 64  Resp: 18  Temp: (!) 96.8 F (36 C)  TempSrc: Oral  Weight: 156 lb (70.8 kg)  Height: $Remove'5\' 5"'TJHJCyc$  (1.651 m)   Body mass index is 25.96 kg/m.  Physical Exam  GENERAL APPEARANCE: Well nourished. In no acute distress. Normal body habitus SKIN:  Right 1st toe with dressing. MOUTH and THROAT: Lips are without lesions. Oral mucosa is moist and without lesions. Tongue is  normal in shape, size, and color and without lesions RESPIRATORY: Breathing is even & unlabored, BS CTAB CARDIAC: RRR, no murmur,no extra heart sounds, no edema GI: Abdomen soft, normal BS, no masses, no tenderness EXTREMITIES:  Able to move X 4 extremities NEUROLOGICAL: There is no tremor. Speech is clear. Alert and oriented X 3. PSYCHIATRIC:  Affect and behavior are appropriate  Labs reviewed: Recent Labs    02/07/19 1323 02/07/19 1527 02/08/19 0556 02/09/19 0508 03/02/19 0253 05/04/19 1144 07/20/19 0301 07/20/19 0301 07/21/19 0356 08/02/19 0000 08/11/19 1638 08/16/19 0000 08/23/19 1701 09/20/19 0000 11/25/19 0000  NA 143   < > 145   < > 140   < > 137   < >  139   < > 139   < > 143 138 142  K 3.3*   < > 4.1   < > 3.7   < > 3.6   < > 3.6   < > 4.9   < > 4.4 4.2 4.4  CL 110   < > 109   < > 105   < > 98   < > 102   < > 98   < > 102 101 107  CO2 23   < > 27   < > 26   < > 27   < > 29   < > 27   < > 25 23* 26*  GLUCOSE 203*   < > 143*   < > 115*   < > 239*   < > 209*  --  171*  --  121*  --   --   BUN 25*   < > 26*   < > 24*   < > 26*   < > 20   < > 45*   < > 44* 65* 27*  CREATININE 1.48*   < > 1.58*   < > 1.47*   < > 1.56*   < > 1.35*   < > 1.70*   < > 1.62* 1.8* 1.6*  CALCIUM 8.2*   < > 9.0   < > 8.8*   < > 8.6*   < > 8.5*   < > 9.0   < > 9.4 8.6* 8.6*  MG 1.7   < > 2.3  --  1.7  --  1.8  --   --   --   --   --   --   --   --   PHOS 3.6  --   --   --   --   --   --   --   --   --   --   --   --   --   --    < > = values in this interval not displayed.   Recent Labs    02/07/19 1323 02/07/19 1323 02/24/19 1020 07/17/19 2150 11/25/19 0000  AST 13*   < > $R'16 17 14  'mY$ ALT 12   < > $R'13 13 15  'VS$ ALKPHOS 96   < > 125 141* 140*  BILITOT 1.0  --  0.6 0.8  --   PROT 6.5  --  7.8 7.6  --   ALBUMIN 3.1*   < > 3.4* 3.4* 2.9*   < > = values in this interval not displayed.   Recent Labs    07/17/19 2150 07/17/19 2150 07/19/19 0219 07/19/19 0219 07/20/19 0723 08/02/19 0000  08/08/19 0000 09/20/19 0000 11/25/19 0000  WBC 18.9*   < > 16.1*   < > 9.2   < > 7.7 8.0 6.9  NEUTROABS 17.3*  --   --   --   --    < > $R'5 5 4  'lF$ HGB 12.8   < > 10.3*   < > 10.3*   < > 10.9* 11.0* 10.3*  HCT 40.0   < > 32.2*   < > 32.4*   < > 33* 34* 31*  MCV 94.8  --  93.9  --  93.9  --   --   --   --   PLT 256   < > 184   < > 193   < > 299 246 254   < > =  values in this interval not displayed.   Lab Results  Component Value Date   TSH 1.630 02/07/2019   Lab Results  Component Value Date   HGBA1C 9 07/14/2019   Lab Results  Component Value Date   CHOL 99 02/07/2019   HDL 26 (L) 02/07/2019   LDLCALC 50 02/07/2019   TRIG 117 02/07/2019   CHOLHDL 3.8 02/07/2019    Significant Diagnostic Results in last 30 days:  DG Foot Complete Right  Result Date: 11/24/2019 Please see detailed radiograph report in office note.  VAS [place] ABI WITH/WO TBI  Result Date: 11/29/2019 LOWER EXTREMITY DOPPLER STUDY Indications: Ulceration, and peripheral artery disease. High Risk Factors: Hypertension, hyperlipidemia, Diabetes, no history of                    smoking, prior CVA. Other Factors: Patient has a bandaged ulcer on her right great toe.  Comparison Study: Prior ABI performed 06/25/2017 at [place] Right: 1.23 ABI/ 0.70 TBI with biphasic arterial                   Doppler waveforms at the ankle and Left: 1.38 ABI/ 0.70 TBI                   with biphasic arterial Doppler waveforms at the ankle. Performing Technologist: Delorise Shiner RVT  Examination Guidelines: A complete evaluation includes at minimum, Doppler waveform signals and systolic blood pressure reading at the level of bilateral brachial, anterior tibial, and posterior tibial arteries, when vessel segments are accessible. Bilateral testing is considered an integral part of a complete examination. Photoelectric Plethysmograph (PPG) waveforms and toe systolic pressure readings are included as required and  additional duplex testing as needed. Limited examinations for reoccurring indications may be performed as noted.  ABI Findings: +---------+----------------+-----+------------------+--------------------------+ Right    Rt Pressure     IndexWaveform          Comment                             (mmHg)                                                            +---------+----------------+-----+------------------+--------------------------+ Brachial 147                                                               +---------+----------------+-----+------------------+--------------------------+ ATA      240             1.63                                              +---------+----------------+-----+------------------+--------------------------+ PTA      255             1.73 dampened  monophasic                                   +---------+----------------+-----+------------------+--------------------------+ DP                            monophasic                                   +---------+----------------+-----+------------------+--------------------------+ Great Toe                     Normal            Could not obtain, open                                                     wound                      +---------+----------------+-----+------------------+--------------------------+ +---------+------------------+-----+----------+-----------------------+ Left     Lt Pressure (mmHg)IndexWaveform  Comment                 +---------+------------------+-----+----------+-----------------------+ Brachial                                  Not obtained mastectomy +---------+------------------+-----+----------+-----------------------+ ATA      255               1.73                                   +---------+------------------+-----+----------+-----------------------+ PTA      255                1.73 monophasic                        +---------+------------------+-----+----------+-----------------------+ DP                              triphasic                         +---------+------------------+-----+----------+-----------------------+ Great Toe103               0.70                                   +---------+------------------+-----+----------+-----------------------+ +-------+----------------+------------+------------+------------+ ABI/TBIToday's ABI     Today's TBI Previous ABIPrevious TBI +-------+----------------+------------+------------+------------+ Right  Non compressibleNot obtained1.23        0.70         +-------+----------------+------------+------------+------------+ Left   Non compressible0.70        1.38        0.70         +-------+----------------+------------+------------+------------+  TOES Findings:  On the right, PPG signals are present in all five digits. See attached images.   Left ABIs appear essentially unchanged compared to prior study on 06/25/2017. Right ABI's were non compressible with monophasic arterial Doppler signals at the  ankle suggesting progression of medial calcification compared to prior exam.  Summary: Right: Resting right ankle-brachial index indicates noncompressible right lower extremity arteries. Left: Resting left ankle-brachial index indicates noncompressible left lower extremity arteries. The left toe-brachial index is normal. LT Great toe pressure = 103 mmHg.  *See table(s) above for measurements and observations.  Electronically signed by Monica Martinez MD on 11/29/2019 at 1:12:38 PM.    Final     Assessment/Plan  1. Osteomyelitis of great toe of right foot ([place]) - continue Augmentin and Cipro - continue wound treatment - follow up with podiatry -  Will refer to vascular surgery due to abnormal ABI  2. Type 2 diabetes mellitus with stage 3b chronic kidney disease, with long-term current use of  insulin ([place]) Lab Results  Component Value Date   HGBA1C 9 07/14/2019   - continue Xultophy and CBG monitoring  3. Neuropathy -  Continue Gabapentin     Family/ staff Communication: Discussed plan of care with resident and charge nurse.  Labs/tests ordered: None  Goals of care:   Long-term care   Durenda Age, DNP, MSN, FNP-BC [facility] [phone] (Monday-Friday 8:00 a.m. - 5:00 p.m.) [phone] (after hours)

## 2019-12-08 ENCOUNTER — Other Ambulatory Visit: Payer: Self-pay

## 2019-12-08 ENCOUNTER — Encounter: Payer: Self-pay | Admitting: Sports Medicine

## 2019-12-08 ENCOUNTER — Non-Acute Institutional Stay (SKILLED_NURSING_FACILITY): Payer: Medicare Other | Admitting: Adult Health

## 2019-12-08 ENCOUNTER — Ambulatory Visit (INDEPENDENT_AMBULATORY_CARE_PROVIDER_SITE_OTHER): Payer: Medicare Other | Admitting: Sports Medicine

## 2019-12-08 ENCOUNTER — Encounter: Payer: Self-pay | Admitting: Adult Health

## 2019-12-08 DIAGNOSIS — M869 Osteomyelitis, unspecified: Secondary | ICD-10-CM | POA: Diagnosis not present

## 2019-12-08 DIAGNOSIS — E1122 Type 2 diabetes mellitus with diabetic chronic kidney disease: Secondary | ICD-10-CM | POA: Diagnosis not present

## 2019-12-08 DIAGNOSIS — G629 Polyneuropathy, unspecified: Secondary | ICD-10-CM

## 2019-12-08 DIAGNOSIS — I5032 Chronic diastolic (congestive) heart failure: Secondary | ICD-10-CM

## 2019-12-08 DIAGNOSIS — F325 Major depressive disorder, single episode, in full remission: Secondary | ICD-10-CM

## 2019-12-08 DIAGNOSIS — E0800 Diabetes mellitus due to underlying condition with hyperosmolarity without nonketotic hyperglycemic-hyperosmolar coma (NKHHC): Secondary | ICD-10-CM

## 2019-12-08 DIAGNOSIS — I2510 Atherosclerotic heart disease of native coronary artery without angina pectoris: Secondary | ICD-10-CM | POA: Diagnosis not present

## 2019-12-08 DIAGNOSIS — N1832 Chronic kidney disease, stage 3b: Secondary | ICD-10-CM

## 2019-12-08 DIAGNOSIS — Z8673 Personal history of transient ischemic attack (TIA), and cerebral infarction without residual deficits: Secondary | ICD-10-CM | POA: Diagnosis not present

## 2019-12-08 DIAGNOSIS — Z794 Long term (current) use of insulin: Secondary | ICD-10-CM

## 2019-12-08 DIAGNOSIS — L97511 Non-pressure chronic ulcer of other part of right foot limited to breakdown of skin: Secondary | ICD-10-CM | POA: Diagnosis not present

## 2019-12-08 DIAGNOSIS — I10 Essential (primary) hypertension: Secondary | ICD-10-CM

## 2019-12-08 DIAGNOSIS — I739 Peripheral vascular disease, unspecified: Secondary | ICD-10-CM

## 2019-12-08 NOTE — Progress Notes (Signed)
Subjective: [name] is a 74 y.o. female patient seen in office for evaluation of ulceration of the right great toe.  Patient is from [facility] and does not recall much information reports that when she was taking the gabapentin it helped with pain at night however the last few nights she has been feeling more pain and not sure if they have been giving her the medication.  Patient denies nausea vomiting fever chills or any other constitutional symptoms at this time.  Patient Active Problem List   Diagnosis Date Noted  . Skin pustule 10/20/2019  . Neurocognitive deficits 08/04/2019  . Controlled type 2 diabetes mellitus without complication ([place]) 16/55/3748  . CVA (cerebral vascular accident) ([place]) 07/23/2019  . Infection of pacemaker lead wire ([place]) 07/21/2019  . Fever   . History of transcatheter aortic valve replacement (TAVR)   . Septic encephalopathy 07/18/2019  . Sacral decubitus ulcer, stage II ([place]) 07/18/2019  . Bacteremia due to group B Streptococcus 07/18/2019  . Sepsis ([place]) 07/18/2019  . Complete heart block ([place])   . Chronic diastolic congestive heart failure ([place]) 02/07/2019  . AKI (acute kidney injury) ([place]) 07/22/2017  . Peripheral arterial disease ([place]) 06/18/2017  . B12 deficiency 05/01/2017  . Bilateral lower extremity edema 11/08/2015  . Aortic atherosclerosis ([place]) 06/07/2014  . Abnormality of gait 05/29/2012  . Constipation 03/16/2012  . HOCM (hypertrophic obstructive cardiomyopathy) ([place]) 06/11/2011  . Routine health maintenance 06/10/2010  . Hyperlipidemia   . Refusal of blood transfusions as patient is Jehovah's Witness   . History of adenocarcinoma of breast   . Osteoporosis   . Status post CVA   . Aortic stenosis s/p Tissure AVR 2006   . CAD (coronary artery disease)   . Hypertension   . Depression 01/23/2006  . DM (diabetes mellitus) ([place]) 03/25/1990  . Benign hypertensive heart and kidney disease with diastolic  CHF, NYHA class 3 and CKD stage 3 ([place]) 1992  . Bradycardia 1992   Current Outpatient Medications on File Prior to Visit  Medication Sig Dispense Refill  . acetaminophen (TYLENOL) 325 MG tablet Take 650 mg by mouth every 6 (six) hours as needed.     Marland Kitchen acetaminophen (TYLENOL) 325 MG tablet Take 650 mg by mouth in the morning.    Marland Kitchen amLODipine (NORVASC) 5 MG tablet Take 1 tablet (5 mg total) by mouth daily. 90 tablet 3  . amoxicillin (AMOXIL) 500 MG capsule Take 2,000 mg by mouth as needed. Prior to dental procedure    . amoxicillin-clavulanate (AUGMENTIN) 875-125 MG tablet Take 1 tablet by mouth daily.    Marland Kitchen aspirin 81 MG EC tablet TAKE 1 TABLET (81 MG TOTAL) BY MOUTH DAILY. SWALLOW WHOLE. 30 tablet 3  . atorvastatin (LIPITOR) 80 MG tablet Take 1 tablet (80 mg total) by mouth at bedtime. 90 tablet 3  . ciprofloxacin (CIPRO) 500 MG tablet Take 1 tablet (500 mg total) by mouth 2 (two) times daily. 20 tablet 0  . clopidogrel (PLAVIX) 75 MG tablet Take 1 tablet (75 mg total) by mouth daily with breakfast. 90 tablet 3  . diclofenac Sodium (VOLTAREN) 1 % GEL Apply 4 g topically 4 (four) times daily as needed (Pain). Apply to bilateral hips and knees    . EASY TOUCH PEN NEEDLES 31G X 5 MM MISC USE TO INJECT INSULIN INTO THE SKIN (Patient not taking: Reported on 10/11/2019)    . furosemide (LASIX) 80 MG tablet Take 80 mg by mouth 2 (two) times  daily.    . gabapentin (NEURONTIN) 100 MG capsule Take 1 capsule (100 mg total) by mouth at bedtime. 90 capsule 1  . hydrALAZINE (APRESOLINE) 50 MG tablet Take 1 tablet (50 mg total) by mouth 3 (three) times daily. 270 tablet 0  . Insulin Degludec-Liraglutide (XULTOPHY) 100-3.6 UNIT-MG/ML SOPN Inject 14 Units into the skin daily.    Marland Kitchen lactulose (CHRONULAC) 10 GM/15ML solution Take by mouth 2 (two) times daily. 30 ml    . metoprolol succinate (TOPROL-XL) 25 MG 24 hr tablet Take 1 tablet (25 mg total) by mouth daily. 90 tablet 1  . mupirocin ointment (BACTROBAN) 2 %  Apply to wound after soaking BID 30 g 1  . PARoxetine (PAXIL) 30 MG tablet TAKE 1 TABLET BY MOUTH DAILY 30 tablet 11  . potassium chloride SA (KLOR-CON) 20 MEQ tablet Take 20 mEq by mouth daily.      No current facility-administered medications on file prior to visit.   Allergies  Allergen Reactions  . 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," as the patient is a Sales promotion account executive Witness    Recent Results (from the past 2160 hour(s))  CBC and differential     Status: Abnormal   Collection Time: 09/20/19 12:00 AM  Result Value Ref Range   Hemoglobin 11.0 (A) 12.0 - 16.0   HCT 34 (A) 36 - 46   Neutrophils Absolute 5    Platelets 246 150 - 399   WBC 8.0   CBC     Status: Abnormal   Collection Time: 09/20/19 12:00 AM  Result Value Ref Range   RBC 3.76 (A) 3.87 - 0.63  Basic metabolic panel     Status: Abnormal   Collection Time: 09/20/19 12:00 AM  Result Value Ref Range   Glucose 153    BUN 65 (A) 4 - 21   CO2 23 (A) 13 - 22   Creatinine 1.8 (A) 0.5 - 1.1   Potassium 4.2 3.4 - 5.3   Sodium 138 137 - 147   Chloride 101 99 - 108  Comprehensive metabolic panel     Status: Abnormal   Collection Time: 09/20/19 12:00 AM  Result Value Ref Range   GFR calc Af Amer 32.28    GFR calc non Af Amer 27.85    Calcium 8.6 (A) 8.7 - 10.7  HM HEPATITIS C SCREENING LAB     Status: None   Collection Time: 10/17/19 12:00 AM  Result Value Ref Range   HM Hepatitis Screen Negative-Validated     Comment: 0.08  HM HEPATITIS C SCREENING LAB     Status: None   Collection Time: 10/17/19 12:00 AM  Result Value Ref Range   HM Hepatitis Screen Negative-Validated     Comment: 0.08  WOUND CULTURE     Status: Abnormal   Collection Time: 11/24/19  9:03 AM   Specimen: Wound  Result Value Ref Range   MICRO NUMBER: [id number]    SPECIMEN QUALITY: Adequate    SOURCE: RIGHT HALLUX    STATUS: FINAL    GRAM STAIN:      Moderate White blood cells seen No epithelial cells seen Many Gram positive cocci in  pairs Many Gram negative bacilli   ISOLATE 1: Morganella morganii (A)     Comment: Heavy growth of Morganella morganii   ISOLATE 2: Enterobacter cloacae (A)     Comment: Heavy growth of Enterobacter cloacae      Susceptibility   Enterobacter cloacae - AEROBIC CULT, GRAM  STAIN NEGATIVE 2    AMOX/CLAVULANIC 8 Resistant     CEFAZOLIN >=64 Resistant     CEFEPIME <=1 Sensitive     CEFTRIAXONE 8 Resistant     CIPROFLOXACIN <=0.25 Sensitive     LEVOFLOXACIN 1 Intermediate     ERTAPENEM <=0.5 Sensitive     GENTAMICIN >=16 Resistant     IMIPENEM <=0.25 Sensitive     PIP/TAZO <=4 Sensitive     TOBRAMYCIN 8 Intermediate     TRIMETH/SULFA* >=320 Resistant      * For infections other than uncomplicated UTIcaused by E. coli, K. pneumoniae or P. mirabilis:Cefazolin is resistant if MIC > or = 8 mcg/mL.(Distinguishing susceptible versus intermediatefor isolates with MIC < or = 4 mcg/mL requiresadditional testing.)Legend:S = Susceptible  I = IntermediateR = Resistant  NS = Not susceptible* = Not tested  NR = Not reported**NN = See antimicrobic comments   Morganella morganii - AEROBIC CULT, GRAM STAIN NEGATIVE 1    AMOX/CLAVULANIC >=32 Resistant     AMPICILLIN >=32 Resistant     AMPICILLIN/SULBACTAM >=32 Resistant     CEFAZOLIN* >=64 Resistant      * For infections other than uncomplicated UTIcaused by E. coli, K. pneumoniae or P. mirabilis:Cefazolin is resistant if MIC > or = 8 mcg/mL.(Distinguishing susceptible versus intermediatefor isolates with MIC < or = 4 mcg/mL requiresadditional testing.)    CEFEPIME <=1 Sensitive     CEFTRIAXONE <=1 Sensitive     CIPROFLOXACIN >=4 Resistant     LEVOFLOXACIN >=8 Resistant     ERTAPENEM <=0.5 Sensitive     GENTAMICIN <=1 Sensitive     PIP/TAZO <=4 Sensitive     TOBRAMYCIN <=1 Sensitive     TRIMETH/SULFA >=320 Resistant   CBC and differential     Status: Abnormal   Collection Time: 11/25/19 12:00 AM  Result Value Ref Range   Hemoglobin 10.3 (A) 12.0 - 16.0    HCT 31 (A) 36 - 46   Neutrophils Absolute 4    Platelets 254 150 - 399   WBC 6.9   CBC     Status: Abnormal   Collection Time: 11/25/19 12:00 AM  Result Value Ref Range   RBC 3.49 (A) 3.87 - 9.15  Basic metabolic panel     Status: Abnormal   Collection Time: 11/25/19 12:00 AM  Result Value Ref Range   Glucose 86    BUN 27 (A) 4 - 21   CO2 26 (A) 13 - 22   Creatinine 1.6 (A) 0.5 - 1.1   Potassium 4.4 3.4 - 5.3   Sodium 142 137 - 147   Chloride 107 99 - 108  Comprehensive metabolic panel     Status: Abnormal   Collection Time: 11/25/19 12:00 AM  Result Value Ref Range   Globulin 2.8    GFR calc Af Amer 34.88    GFR calc non Af Amer 30.09    Calcium 8.6 (A) 8.7 - 10.7   Albumin 2.9 (A) 3.5 - 5.0  Hepatic function panel     Status: Abnormal   Collection Time: 11/25/19 12:00 AM  Result Value Ref Range   Alkaline Phosphatase 140 (A) 25 - 125   ALT 15 7 - 35   AST 14 13 - 35   Bilirubin, Total 0.2     Objective: There were no vitals filed for this visit.  General: Patient is awake, alert, oriented x 3 and in no acute distress.  Dermatology: Skin is warm and dry bilateral with a  full thickness ulceration present distal tuft of the right great toe  Ulceration measures 1.8 cm x 0.8 cm x 0.4 cm. There is a  Keratotic border with a fibro granular base. The ulceration does probe to bone. There is no malodor, bloody active drainage, no erythema, localized edema. No other acute signs of infection.   Vascular: Dorsalis Pedis pulse = 0/4 Bilateral,  Posterior Tibial pulse = 0/4 Bilateral,  Capillary Fill Time < 5 seconds  Neurologic: Protective sensation severely diminished with significant neuropathy pain worse at night.  Musculosketal: There is pain with palpation to ulcerated area.  Severe end-stage bunion deformity.  No pain with compression to calves bilateral.  ABIs reviewed consistent with noncompressible vessels on the right  Recent Labs    07/17/19 2150  LABORGA  GROUP B STREP(S.AGALACTIAE)ISOLATED    Assessment and Plan:  Problem List Items Addressed This Visit      Endocrine   DM (diabetes mellitus) ([place])    Other Visit Diagnoses    Skin ulcer of right great toe, limited to breakdown of skin ([place])    -  Primary   Osteomyelitis of great toe of right foot ([place])       PAD (peripheral artery disease) ([place])           -Examined patient and discussed the progression of the wound and treatment alternatives. -Cleansed ulceration using a saline moistened gauze debrided any loose tissue to the area at the right great toe.  Hemostasis was achieved with manuel pressure. Patient tolerated nonselective debridement procedure well without any discomfort or anesthesia necessary for this wound debridement.  -Applied Betadine and dry sterile dressing and instructed patient to continue with daily dressings at home consisting of same if macerated or antibiotic cream if excessively dry at the facility daily -Continue with ciprofloxacin for positive culture of Enterococcus -Continue with gabapentin 100 mg at bedtime for increased neuropathy pain -Patient is awaiting vascular follow-up echo - Advised patient to go to the ER or return to office if the wound worsens or if constitutional symptoms are present. -Patient to return to office in 1 to 2 weeks for follow up care and evaluation or sooner if problems arise.  Landis Martins, DPM

## 2019-12-08 NOTE — Progress Notes (Signed)
Location:  [facility] Room Number: 217-A Place of Service:  SNF (31) Provider:  Durenda Age, DNP, FNP-BC  Patient Care Team: Hendricks Limes, MD as PCP - General (Internal Medicine) Dorothy Spark, MD as PCP - Cardiology (Cardiology) Constance Haw, MD as PCP - Electrophysiology (Cardiology) Medina-Vargas, Senaida Lange, NP as Nurse Practitioner (Internal Medicine) Rehab, [facility] ([place])  Extended Emergency Contact Information Primary Emergency Contact: Lieutenant Diego [place] of [place] Phone: [phone] Mobile Phone: [phone] Relation: Daughter Secondary Emergency Contact: Armstrong,[name]  [place] Phone: [phone] Relation: Daughter  Code Status:  FULL CODE  Goals of care: Advanced Directive information Advanced Directives 11/11/2019  Does Patient Have a Medical Advance Directive? Yes  Type of Advance Directive -  Does patient want to make changes to medical advance directive? No - Patient declined  Copy of [place] in Chart? -  Would patient like information on creating a medical advance directive? -  Pre-existing out of facility DNR order (yellow form or pink MOST form) -     Chief Complaint  Patient presents with  . Medical Management of Chronic Issues    Routine [facility] visit    HPI:  Pt is a 74 y.o. female seen today for medical management of chronic diseases.She is a long-term care resident of [facility]. She has a PMH of essential hypertension, dyslipidemia, history of stroke, chronic kidney disease, CAD, severe aortic stenosis, history of breast cancer and history of chronic diastolic congestive heart failure. She was seen in the room today. She stated that her right great toe hurts mostly at night. She was recently started on Neurontin 100 mg at bedtime for neuropathy. She is currently on Augmentin  875-125 mg 1 tab twice a day, Ciprofloxacin 500 mg 1 tab twice a day and mupirocin 2% cream topically to right great toe daily for right great toe osteomyelitis.  She was seen by orthopedics today.  CBGs ranging from 78-214.  She takes Xultophy 14 units daily for diabetes mellitus.   Past Medical History:  Diagnosis Date  . Acute on chronic diastolic CHF (congestive heart failure) ([place]) 06/22/2017  . Acute on chronic renal failure ([place]) 05/25/2011  . Adenocarcinoma of breast ([place]) 1996   Completed tamoxifen and had mastectomy.  . Aortic stenosis, severe    s/p aortic valve replacement with porcine valve 06/2004.  ECHO 2010 EF 54%, LVH, diastolic dysfxn, Bioprostetic aoritc valve, mild AS. ECHO 2013 EF 60%, Nl aortic artificial valve, dynamic obstruction in the outflow tract   Class IIb rec for annual TTE after 5 yrs. She had a TTE 2013    . Arthritis   . CAD (coronary artery disease) 2006   s/p CABG (5/06) w/ saphenous vein to RCA at time of AVR  . CKD (chronic kidney disease) stage 2, GFR 60-89 ml/min 06/03/2011   She is no longer taking NSAIDs.   Marland Kitchen CVA (cerebral infarction) 2006   Post-op from AVR. Presumed embolic in nature. Carotid stenosis of R 60-79%. Repeat dopplers 4/10 no R stenosis and L stenosis of 1-29%.  . Depression    Controlled on Paxil  . Diabetes mellitus 1992   Dx 04/25/1990. Now insulin dependent, started 2008. On ACEI.   . Diverticulosis 2001  . History of 2019 novel coronavirus disease (COVID-19)   . Hyperlipidemia    Mgmt with a statin  . Hypertension    Requires 4 drug tx  .  Osteoporosis 2006   DEXA 10/06 : L femur T -2.8, R -2.7. Lumbar T -2.4. On bisphosphonates and  Calcium / Vit D.  . Presence of permanent cardiac pacemaker   . Refusal of blood transfusions as patient is Jehovah's Witness   . Stroke [facility])    Past Surgical History:  Procedure Laterality Date  . ABDOMINAL HYSTERECTOMY  1987   for fibroids  . AORTIC VALVE REPLACEMENT  2006  . CHOLECYSTECTOMY     . CORONARY ARTERY BYPASS GRAFT  2006   Saphenous vein to RCA at time of AVR. Course complicated by acute respiratory failure, post-op PTX, ARI, ileus, CVA  . MASTECTOMY Left 1995   L for adenocarcinoma  . PACEMAKER IMPLANT N/A 02/09/2019   Procedure: PACEMAKER IMPLANT;  Surgeon: Constance Haw, MD;  Location: [facility] CV LAB;  Service: Cardiovascular;  Laterality: N/A;  . RIGHT HEART CATH AND CORONARY/GRAFT ANGIOGRAPHY N/A 02/09/2019   Procedure: RIGHT HEART CATH AND CORONARY/GRAFT ANGIOGRAPHY;  Surgeon: Sherren Mocha, MD;  Location: [place] [facility] CV LAB;  Service: Cardiovascular;  Laterality: N/A;  . TEE WITHOUT CARDIOVERSION N/A 07/20/2019   Procedure: TRANSESOPHAGEAL ECHOCARDIOGRAM (TEE);  Surgeon: Jerline Pain, MD;  Location: [facility] ENDOSCOPY;  Service: Cardiovascular;  Laterality: N/A;  . TRANSCATHETER AORTIC VALVE REPLACEMENT, TRANSFEMORAL N/A 03/01/2019   Procedure: TRANSCATHETER AORTIC VALVE REPLACEMENT, TRANSFEMORAL;  Surgeon: Sherren Mocha, MD;  Location: [place] CV LAB;  Service: Open Heart Surgery;  Laterality: N/A;    Allergies  Allergen Reactions  . 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," as the patient is a Jehovah's Witness    Outpatient Encounter Medications as of 12/08/2019  Medication Sig  . acetaminophen (TYLENOL) 325 MG tablet Take 650 mg by mouth every 6 (six) hours as needed.   Marland Kitchen acetaminophen (TYLENOL) 325 MG tablet Take 650 mg by mouth in the morning.  Marland Kitchen amLODipine (NORVASC) 5 MG tablet Take 1 tablet (5 mg total) by mouth daily.  Marland Kitchen amoxicillin (AMOXIL) 500 MG capsule Take 2,000 mg by mouth as needed. Prior to dental procedure  . amoxicillin-clavulanate (AUGMENTIN) 875-125 MG tablet Take 1 tablet by mouth daily.  Marland Kitchen aspirin 81 MG EC tablet TAKE 1 TABLET (81 MG TOTAL) BY MOUTH DAILY. SWALLOW WHOLE.  Marland Kitchen atorvastatin (LIPITOR) 80 MG tablet Take 1 tablet (80 mg total) by mouth at bedtime.  . ciprofloxacin (CIPRO) 500 MG tablet Take 1 tablet (500  mg total) by mouth 2 (two) times daily.  . clopidogrel (PLAVIX) 75 MG tablet Take 1 tablet (75 mg total) by mouth daily with breakfast.  . diclofenac Sodium (VOLTAREN) 1 % GEL Apply 4 g topically 4 (four) times daily as needed (Pain). Apply to bilateral hips and knees  . EASY TOUCH PEN NEEDLES 31G X 5 MM MISC USE TO INJECT INSULIN INTO THE SKIN (Patient not taking: Reported on 10/11/2019)  . furosemide (LASIX) 80 MG tablet Take 80 mg by mouth 2 (two) times daily.  Marland Kitchen gabapentin (NEURONTIN) 100 MG capsule Take 1 capsule (100 mg total) by mouth at bedtime.  . hydrALAZINE (APRESOLINE) 50 MG tablet Take 1 tablet (50 mg total) by mouth 3 (three) times daily.  . Insulin Degludec-Liraglutide (XULTOPHY) 100-3.6 UNIT-MG/ML SOPN Inject 14 Units into the skin daily.  Marland Kitchen lactulose (CHRONULAC) 10 GM/15ML solution Take by mouth 2 (two) times daily. 30 ml  . metoprolol succinate (TOPROL-XL) 25 MG 24 hr tablet Take 1 tablet (25 mg total) by mouth daily.  . mupirocin ointment (BACTROBAN) 2 %  Apply to wound after soaking BID  . PARoxetine (PAXIL) 30 MG tablet TAKE 1 TABLET BY MOUTH DAILY  . potassium chloride SA (KLOR-CON) 20 MEQ tablet Take 20 mEq by mouth daily.    No facility-administered encounter medications on file as of 12/08/2019.    Review of Systems  GENERAL: No change in appetite, no fatigue, no weight changes, no fever, chills or weakness MOUTH and THROAT: Denies oral discomfort, gingival pain or bleeding RESPIRATORY: no cough, SOB, DOE, wheezing, hemoptysis CARDIAC: No chest pain, edema or palpitations GI: No abdominal pain, diarrhea, constipation, heart burn, nausea or vomiting GU: Denies dysuria, frequency, hematuria, incontinence, or discharge NEUROLOGICAL: Denies dizziness, syncope, numbness, or headache PSYCHIATRIC: Denies feelings of depression or anxiety. No report of hallucinations, insomnia, paranoia, or agitation   Immunization History  Administered Date(s) Administered  . Fluad  Quad(high Dose 65+) 02/11/2019  . Influenza Split 11/12/2010, 11/10/2011  . Influenza Whole 11/07/2009  . Influenza,inj,Quad PF,6+ Mos 10/19/2012, 01/05/2014, 11/08/2015, 01/29/2017  . Moderna SARS-COVID-2 Vaccination 03/09/2019, 04/06/2019  . Pneumococcal Conjugate-13 06/07/2014  . Pneumococcal Polysaccharide-23 12/26/1994, 03/05/2010  . Td 09/24/1993  . Tdap 11/12/2010, 07/12/2013   Pertinent  Health Maintenance Due  Topic Date Due  . HEMOGLOBIN A1C  10/14/2019  . FOOT EXAM  02/24/2020 (Originally 07/10/2017)  . OPHTHALMOLOGY EXAM  02/24/2020 (Originally 11/07/2016)  . INFLUENZA VACCINE  05/24/2020 (Originally 09/25/2019)  . LIPID PANEL  02/07/2020  . DEXA SCAN  Completed  . PNA vac Low Risk Adult  Completed   Fall Risk  08/02/2019 06/16/2019 05/19/2019 05/04/2019 04/27/2019  Falls in the past year? 1 1 1  0 0  Comment - pt denies-but dtr stated pt had a fall in last mths pt denies falls-but dtr states pt has had a fall - -  Number falls in past yr: 1 0 0 - 0  Injury with Fall? 0 0 0 - 0  Risk Factor Category  - - - - -  Risk for fall due to : Impaired mobility History of fall(s);Impaired balance/gait;Impaired mobility;Medication side effect History of fall(s);Impaired balance/gait;Impaired mobility;Medication side effect No Fall Risks -  Risk for fall due to: Comment - - - - -  Follow up Falls evaluation completed Falls evaluation completed;Falls prevention discussed Falls evaluation completed Falls evaluation completed -     Vitals:   12/08/19 1324  BP: (!) 114/51  Pulse: 69  Resp: 16  Temp: (!) 97.5 F (36.4 C)  TempSrc: Oral  Weight: 159 lb 9.6 oz (72.4 kg)  Height: 5\' 5"  (1.651 m)   Body mass index is 26.56 kg/m.  Physical Exam  GENERAL APPEARANCE: Well nourished. In no acute distress. Normal body habitus SKIN:  Right 1st toe covered with dressing MOUTH and THROAT: Lips are without lesions. Oral mucosa is moist and without lesions. Tongue is normal in shape, size, and  color and without lesions RESPIRATORY: Breathing is even & unlabored, BS CTAB CARDIAC: RRR, no murmur,no extra heart sounds, no edema GI: Abdomen soft, normal BS, no masses, no tenderness EXTREMITIES:  Able to move X 4 extremities NEUROLOGICAL: There is no tremor. Speech is clear. Alert and oriented X 3. PSYCHIATRIC:  Affect and behavior are appropriate  Labs reviewed: Recent Labs    02/07/19 1323 02/07/19 1527 02/08/19 0556 02/09/19 0508 03/02/19 0253 05/04/19 1144 07/20/19 0301 07/20/19 0301 07/21/19 0356 08/02/19 0000 08/11/19 1638 08/16/19 0000 08/23/19 1701 09/20/19 0000 11/25/19 0000  NA 143   < > 145   < > 140   < >  137   < > 139   < > 139   < > 143 138 142  K 3.3*   < > 4.1   < > 3.7   < > 3.6   < > 3.6   < > 4.9   < > 4.4 4.2 4.4  CL 110   < > 109   < > 105   < > 98   < > 102   < > 98   < > 102 101 107  CO2 23   < > 27   < > 26   < > 27   < > 29   < > 27   < > 25 23* 26*  GLUCOSE 203*   < > 143*   < > 115*   < > 239*   < > 209*  --  171*  --  121*  --   --   BUN 25*   < > 26*   < > 24*   < > 26*   < > 20   < > 45*   < > 44* 65* 27*  CREATININE 1.48*   < > 1.58*   < > 1.47*   < > 1.56*   < > 1.35*   < > 1.70*   < > 1.62* 1.8* 1.6*  CALCIUM 8.2*   < > 9.0   < > 8.8*   < > 8.6*   < > 8.5*   < > 9.0   < > 9.4 8.6* 8.6*  MG 1.7   < > 2.3  --  1.7  --  1.8  --   --   --   --   --   --   --   --   PHOS 3.6  --   --   --   --   --   --   --   --   --   --   --   --   --   --    < > = values in this interval not displayed.   Recent Labs    02/07/19 1323 02/07/19 1323 02/24/19 1020 07/17/19 2150 11/25/19 0000  AST 13*   < > $R'16 17 14  'mK$ ALT 12   < > $R'13 13 15  'zD$ ALKPHOS 96   < > 125 141* 140*  BILITOT 1.0  --  0.6 0.8  --   PROT 6.5  --  7.8 7.6  --   ALBUMIN 3.1*   < > 3.4* 3.4* 2.9*   < > = values in this interval not displayed.   Recent Labs    07/17/19 2150 07/17/19 2150 07/19/19 0219 07/19/19 0219 07/20/19 0723 08/02/19 0000 08/08/19 0000 09/20/19 0000  11/25/19 0000  WBC 18.9*   < > 16.1*   < > 9.2   < > 7.7 8.0 6.9  NEUTROABS 17.3*  --   --   --   --    < > $R'5 5 4  'ya$ HGB 12.8   < > 10.3*   < > 10.3*   < > 10.9* 11.0* 10.3*  HCT 40.0   < > 32.2*   < > 32.4*   < > 33* 34* 31*  MCV 94.8  --  93.9  --  93.9  --   --   --   --   PLT 256   < > 184   < > 193   < > 299  246 254   < > = values in this interval not displayed.   Lab Results  Component Value Date   TSH 1.630 02/07/2019   Lab Results  Component Value Date   HGBA1C 9 07/14/2019   Lab Results  Component Value Date   CHOL 99 02/07/2019   HDL 26 (L) 02/07/2019   LDLCALC 50 02/07/2019   TRIG 117 02/07/2019   CHOLHDL 3.8 02/07/2019    Significant Diagnostic Results in last 30 days:  DG Foot Complete Right  Result Date: 11/24/2019 Please see detailed radiograph report in office note.  VAS [place] ABI WITH/WO TBI  Result Date: 11/29/2019 LOWER EXTREMITY DOPPLER STUDY Indications: Ulceration, and peripheral artery disease. High Risk Factors: Hypertension, hyperlipidemia, Diabetes, no history of                    smoking, prior CVA. Other Factors: Patient has a bandaged ulcer on her right great toe.  Comparison Study: Prior ABI performed 06/25/2017 at [place] Right: 1.23 ABI/ 0.70 TBI with biphasic arterial                   Doppler waveforms at the ankle and Left: 1.38 ABI/ 0.70 TBI                   with biphasic arterial Doppler waveforms at the ankle. Performing Technologist: Delorise Shiner RVT  Examination Guidelines: A complete evaluation includes at minimum, Doppler waveform signals and systolic blood pressure reading at the level of bilateral brachial, anterior tibial, and posterior tibial arteries, when vessel segments are accessible. Bilateral testing is considered an integral part of a complete examination. Photoelectric Plethysmograph (PPG) waveforms and toe systolic pressure readings are included as required and additional duplex testing as  needed. Limited examinations for reoccurring indications may be performed as noted.  ABI Findings: +---------+----------------+-----+------------------+--------------------------+ Right    Rt Pressure     IndexWaveform          Comment                             (mmHg)                                                            +---------+----------------+-----+------------------+--------------------------+ Brachial 147                                                               +---------+----------------+-----+------------------+--------------------------+ ATA      240             1.63                                              +---------+----------------+-----+------------------+--------------------------+ PTA      255  1.73 dampened                                                                   monophasic                                   +---------+----------------+-----+------------------+--------------------------+ DP                            monophasic                                   +---------+----------------+-----+------------------+--------------------------+ Great Toe                     Normal            Could not obtain, open                                                     wound                      +---------+----------------+-----+------------------+--------------------------+ +---------+------------------+-----+----------+-----------------------+ Left     Lt Pressure (mmHg)IndexWaveform  Comment                 +---------+------------------+-----+----------+-----------------------+ Brachial                                  Not obtained mastectomy +---------+------------------+-----+----------+-----------------------+ ATA      255               1.73                                   +---------+------------------+-----+----------+-----------------------+ PTA      255               1.73 monophasic                         +---------+------------------+-----+----------+-----------------------+ DP                              triphasic                         +---------+------------------+-----+----------+-----------------------+ Great Toe103               0.70                                   +---------+------------------+-----+----------+-----------------------+ +-------+----------------+------------+------------+------------+ ABI/TBIToday's ABI     Today's TBI Previous ABIPrevious TBI +-------+----------------+------------+------------+------------+ Right  Non compressibleNot obtained1.23        0.70         +-------+----------------+------------+------------+------------+ Left   Non compressible0.70  1.38        0.70         +-------+----------------+------------+------------+------------+  TOES Findings:  On the right, PPG signals are present in all five digits. See attached images.   Left ABIs appear essentially unchanged compared to prior study on 06/25/2017. Right ABI's were non compressible with monophasic arterial Doppler signals at the ankle suggesting progression of medial calcification compared to prior exam.  Summary: Right: Resting right ankle-brachial index indicates noncompressible right lower extremity arteries. Left: Resting left ankle-brachial index indicates noncompressible left lower extremity arteries. The left toe-brachial index is normal. LT Great toe pressure = 103 mmHg.  *See table(s) above for measurements and observations.  Electronically signed by Monica Martinez MD on 11/29/2019 at 1:12:38 PM.    Final     Assessment/Plan  1. History of CVA (cerebrovascular accident) -Stable, continue Plavix and atorvastatin  2. Type 2 diabetes mellitus with stage 3b chronic kidney disease, with long-term current use of insulin ([place]) Lab Results  Component Value Date   HGBA1C 9 07/14/2019   -Continue Xultophy -  Continue CBG checks  3.  Neuropathy -Stable, recently started on Neurontin  4. Osteomyelitis of great toe of right foot (HCC) -Continue Augmentin and ciprofloxacin -  Follow up with podiatry -  Continue wound treatment with Mupirocin cream  5. Essential hypertension -Stable, continue metoprolol succinate ER, hydralazine and amlodipine  6. Chronic diastolic congestive heart failure (HCC) -  No SOB, continue furosemide and metoprolol succinate ER  7. Major depressive disorder with single episode, in full remission ([place]) -  Mood is stable, continue paroxetine    Family/ staff Communication: Discussed plan of care with resident and charge nurse.  Labs/tests ordered:   None  Goals of care: Long-term care   Durenda Age, DNP, MSN, FNP-BC [facility] [phone] (Monday-Friday 8:00 a.m. - 5:00 p.m.) [phone] (after hours)

## 2019-12-09 LAB — HEPATIC FUNCTION PANEL: Alkaline Phosphatase: 149 — AB (ref 25–125)

## 2019-12-15 ENCOUNTER — Non-Acute Institutional Stay (SKILLED_NURSING_FACILITY): Payer: Medicare Other | Admitting: Adult Health

## 2019-12-15 ENCOUNTER — Encounter: Payer: Self-pay | Admitting: Adult Health

## 2019-12-15 DIAGNOSIS — N1832 Chronic kidney disease, stage 3b: Secondary | ICD-10-CM

## 2019-12-15 DIAGNOSIS — E1122 Type 2 diabetes mellitus with diabetic chronic kidney disease: Secondary | ICD-10-CM | POA: Diagnosis not present

## 2019-12-15 DIAGNOSIS — M869 Osteomyelitis, unspecified: Secondary | ICD-10-CM | POA: Diagnosis not present

## 2019-12-15 DIAGNOSIS — G629 Polyneuropathy, unspecified: Secondary | ICD-10-CM | POA: Diagnosis not present

## 2019-12-15 DIAGNOSIS — Z794 Long term (current) use of insulin: Secondary | ICD-10-CM

## 2019-12-15 NOTE — Progress Notes (Signed)
Location:  [facility] Room Number: 217-A Place of Service:  SNF (31) Provider:  Durenda Age, DNP, FNP-BC  Patient Care Team: Hendricks Limes, MD as PCP - General (Internal Medicine) Dorothy Spark, MD as PCP - Cardiology (Cardiology) Constance Haw, MD as PCP - Electrophysiology (Cardiology) Medina-Vargas, Senaida Lange, NP as Nurse Practitioner (Internal Medicine) Rehab, [facility])  Extended Emergency Contact Information Primary Emergency Contact: Lieutenant Diego [place] of [place] Phone: [phone] Mobile Phone: [phone] Relation: Daughter Secondary Emergency Contact: Armstrong,[name]  [place] Phone: [phone] Relation: Daughter  Code Status:  FULL CODE  Goals of care: Advanced Directive information Advanced Directives 11/11/2019  Does Patient Have a Medical Advance Directive? Yes  Type of Advance Directive -  Does patient want to make changes to medical advance directive? No - Patient declined  Copy of [place] in Chart? -  Would patient like information on creating a medical advance directive? -  Pre-existing out of facility DNR order (yellow form or pink MOST form) -     Chief Complaint  Patient presents with  . Acute Visit    Patient is seen for right great toe osteomyelitis    HPI:  Pt is a 74 y.o. female seen today for follow-up of right big toe osteomyelitis.  She is a long-term care resident of [facility].  She has a PMH of chronic kidney disease, CAD, severe aortic stenosis, history of breast cancer, essential hypertension, dyslipidemia, history of stroke and chronic diastolic congestive heart failure.She recently completed treatment of Augmentin and Cipro for her right 1st toe osteomyelitis. She was seen in her room today. Noted right heel with 2 open superficial wound, macerated and with serous  drainage. Right 1st ray amputation surgical site is with dressing, dry and no erythema. She stated that she has no pain on her right foot at the moment but usually has pain at night. She was recently started on Neurontin 100 mg @ HS for ]neuropathic pain. CBGs ranging from 96 to 207. Currently she takes Xultophy 14 units daily.   Past Medical History:  Diagnosis Date  . Acute on chronic diastolic CHF (congestive heart failure) ([place]) 06/22/2017  . Acute on chronic renal failure ([place]) 05/25/2011  . Adenocarcinoma of breast ([place]) 1996   Completed tamoxifen and had mastectomy.  . Aortic stenosis, severe    s/p aortic valve replacement with porcine valve 06/2004.  ECHO 2010 EF 16%, LVH, diastolic dysfxn, Bioprostetic aoritc valve, mild AS. ECHO 2013 EF 60%, Nl aortic artificial valve, dynamic obstruction in the outflow tract   Class IIb rec for annual TTE after 5 yrs. She had a TTE 2013    . Arthritis   . CAD (coronary artery disease) 2006   s/p CABG (5/06) w/ saphenous vein to RCA at time of AVR  . CKD (chronic kidney disease) stage 2, GFR 60-89 ml/min 06/03/2011   She is no longer taking NSAIDs.   Marland Kitchen CVA (cerebral infarction) 2006   Post-op from AVR. Presumed embolic in nature. Carotid stenosis of R 60-79%. Repeat dopplers 4/10 no R stenosis and L stenosis of 1-29%.  . Depression    Controlled on Paxil  . Diabetes mellitus 1992   Dx 04/25/1990. Now insulin dependent, started 2008. On ACEI.   . Diverticulosis 2001  . History of 2019 novel coronavirus disease (COVID-19)   . Hyperlipidemia    Mgmt with a statin  .  Hypertension    Requires 4 drug tx  . Osteoporosis 2006   DEXA 10/06 : L femur T -2.8, R -2.7. Lumbar T -2.4. On bisphosphonates and  Calcium / Vit D.  . Presence of permanent cardiac pacemaker   . Refusal of blood transfusions as patient is Jehovah's Witness   . Stroke [facility])    Past Surgical History:  Procedure Laterality Date  . ABDOMINAL HYSTERECTOMY  1987   for fibroids  .  AORTIC VALVE REPLACEMENT  2006  . CHOLECYSTECTOMY    . CORONARY ARTERY BYPASS GRAFT  2006   Saphenous vein to RCA at time of AVR. Course complicated by acute respiratory failure, post-op PTX, ARI, ileus, CVA  . MASTECTOMY Left 1995   L for adenocarcinoma  . PACEMAKER IMPLANT N/A 02/09/2019   Procedure: PACEMAKER IMPLANT;  Surgeon: Constance Haw, MD;  Location: [facility] CV LAB;  Service: Cardiovascular;  Laterality: N/A;  . RIGHT HEART CATH AND CORONARY/GRAFT ANGIOGRAPHY N/A 02/09/2019   Procedure: RIGHT HEART CATH AND CORONARY/GRAFT ANGIOGRAPHY;  Surgeon: Sherren Mocha, MD;  Location: [facility] CV LAB;  Service: Cardiovascular;  Laterality: N/A;  . TEE WITHOUT CARDIOVERSION N/A 07/20/2019   Procedure: TRANSESOPHAGEAL ECHOCARDIOGRAM (TEE);  Surgeon: Jerline Pain, MD;  Location: [facility] ENDOSCOPY;  Service: Cardiovascular;  Laterality: N/A;  . TRANSCATHETER AORTIC VALVE REPLACEMENT, TRANSFEMORAL N/A 03/01/2019   Procedure: TRANSCATHETER AORTIC VALVE REPLACEMENT, TRANSFEMORAL;  Surgeon: Sherren Mocha, MD;  Location: [facility] CV LAB;  Service: Open Heart Surgery;  Laterality: N/A;    Allergies  Allergen Reactions  . 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," as the patient is a Jehovah's Witness    Outpatient Encounter Medications as of 12/15/2019  Medication Sig  . acetaminophen (TYLENOL) 325 MG tablet Take 650 mg by mouth every 6 (six) hours as needed.   Marland Kitchen acetaminophen (TYLENOL) 325 MG tablet Take 650 mg by mouth in the morning.  Marland Kitchen amLODipine (NORVASC) 5 MG tablet Take 1 tablet (5 mg total) by mouth daily.  Marland Kitchen amoxicillin (AMOXIL) 500 MG capsule Take 2,000 mg by mouth as needed. Prior to dental procedure  . aspirin 81 MG EC tablet TAKE 1 TABLET (81 MG TOTAL) BY MOUTH DAILY. SWALLOW WHOLE.  Marland Kitchen atorvastatin (LIPITOR) 80 MG tablet Take 1 tablet (80 mg total) by mouth at bedtime.  . ciprofloxacin (CIPRO) 500 MG tablet Take 1 tablet (500 mg total) by mouth 2 (two) times  daily.  . clopidogrel (PLAVIX) 75 MG tablet Take 1 tablet (75 mg total) by mouth daily with breakfast.  . diclofenac Sodium (VOLTAREN) 1 % GEL Apply 4 g topically 4 (four) times daily as needed (Pain). Apply to bilateral hips and knees  . EASY TOUCH PEN NEEDLES 31G X 5 MM MISC USE TO INJECT INSULIN INTO THE SKIN (Patient not taking: Reported on 10/11/2019)  . furosemide (LASIX) 80 MG tablet Take 80 mg by mouth 2 (two) times daily.  Marland Kitchen gabapentin (NEURONTIN) 100 MG capsule Take 1 capsule (100 mg total) by mouth at bedtime.  . hydrALAZINE (APRESOLINE) 50 MG tablet Take 1 tablet (50 mg total) by mouth 3 (three) times daily.  . Insulin Degludec-Liraglutide (XULTOPHY) 100-3.6 UNIT-MG/ML SOPN Inject 14 Units into the skin daily.  Marland Kitchen lactulose (CHRONULAC) 10 GM/15ML solution Take by mouth 2 (two) times daily. 30 ml  . metoprolol succinate (TOPROL-XL) 25 MG 24 hr tablet Take 1 tablet (25 mg total) by mouth daily.  . mupirocin ointment (BACTROBAN) 2 % Apply to wound  after soaking BID  . PARoxetine (PAXIL) 30 MG tablet TAKE 1 TABLET BY MOUTH DAILY  . potassium chloride SA (KLOR-CON) 20 MEQ tablet Take 20 mEq by mouth daily.   Marland Kitchen saccharomyces boulardii (FLORASTOR) 250 MG capsule Take 250 mg by mouth 2 (two) times daily.   No facility-administered encounter medications on file as of 12/15/2019.    Review of Systems  GENERAL: No change in appetite, no fatigue, no weight changes, no fever, chills or weakness MOUTH and THROAT: Denies oral discomfort, gingival pain or bleeding RESPIRATORY: no cough, SOB, DOE, wheezing, hemoptysis CARDIAC: No chest pain or palpitations GI: No abdominal pain, diarrhea, constipation, heart burn, nausea or vomiting GU: Denies dysuria, frequency, hematuria, incontinence, or discharge NEUROLOGICAL: Denies dizziness, syncope, numbness, or headache PSYCHIATRIC: Denies feelings of depression or anxiety. No report of hallucinations, insomnia, paranoia, or agitation   Immunization  History  Administered Date(s) Administered  . Fluad Quad(high Dose 65+) 02/11/2019  . Influenza Split 11/12/2010, 11/10/2011  . Influenza Whole 11/07/2009  . Influenza,inj,Quad PF,6+ Mos 10/19/2012, 01/05/2014, 11/08/2015, 01/29/2017  . Moderna SARS-COVID-2 Vaccination 03/09/2019, 04/06/2019  . Pneumococcal Conjugate-13 06/07/2014  . Pneumococcal Polysaccharide-23 12/26/1994, 03/05/2010  . Td 09/24/1993  . Tdap 11/12/2010, 07/12/2013   Pertinent  Health Maintenance Due  Topic Date Due  . HEMOGLOBIN A1C  10/14/2019  . FOOT EXAM  02/24/2020 (Originally 07/10/2017)  . OPHTHALMOLOGY EXAM  02/24/2020 (Originally 11/07/2016)  . INFLUENZA VACCINE  05/24/2020 (Originally 09/25/2019)  . LIPID PANEL  02/07/2020  . DEXA SCAN  Completed  . PNA vac Low Risk Adult  Completed   Fall Risk  08/02/2019 06/16/2019 05/19/2019 05/04/2019 04/27/2019  Falls in the past year? $RemoveBe'1 1 1 'VNdAyFTUr$ 0 0  Comment - pt denies-but dtr stated pt had a fall in last mths pt denies falls-but dtr states pt has had a fall - -  Number falls in past yr: 1 0 0 - 0  Injury with Fall? 0 0 0 - 0  Risk Factor Category  - - - - -  Risk for fall due to : Impaired mobility History of fall(s);Impaired balance/gait;Impaired mobility;Medication side effect History of fall(s);Impaired balance/gait;Impaired mobility;Medication side effect No Fall Risks -  Risk for fall due to: Comment - - - - -  Follow up Falls evaluation completed Falls evaluation completed;Falls prevention discussed Falls evaluation completed Falls evaluation completed -     Vitals:   12/15/19 1105  BP: 134/68  Pulse: 67  Resp: 17  Temp: (!) 97.3 F (36.3 C)  TempSrc: Oral  Weight: 159 lb 9.6 oz (72.4 kg)  Height: $Remove'5\' 5"'pvvLpNS$  (1.651 m)   Body mass index is 26.56 kg/m.  Physical Exam  GENERAL APPEARANCE: Well nourished. In no acute distress. Normal body habitus SKIN:  See HPI MOUTH and THROAT: Lips are without lesions. Oral mucosa is moist and without lesions. Tongue is normal  in shape, size, and color and without lesions RESPIRATORY: Breathing is even & unlabored, BS CTAB CARDIAC: RRR, no murmur,no extra heart sounds GI: Abdomen soft, normal BS, no masses, no tenderness EXTREMITIES:  Able to move X 4 extremities. NEUROLOGICAL: There is no tremor. Speech is clear. Alert and oriented X 3. PSYCHIATRIC:  Affect and behavior are appropriate  Labs reviewed: Recent Labs    02/07/19 1323 02/07/19 1527 02/08/19 0556 02/09/19 0508 03/02/19 0253 05/04/19 1144 07/20/19 0301 07/20/19 0301 07/21/19 0356 08/02/19 0000 08/11/19 1638 08/16/19 0000 08/23/19 1701 09/20/19 0000 11/25/19 0000  NA 143   < > 145   < >  140   < > 137   < > 139   < > 139   < > 143 138 142  K 3.3*   < > 4.1   < > 3.7   < > 3.6   < > 3.6   < > 4.9   < > 4.4 4.2 4.4  CL 110   < > 109   < > 105   < > 98   < > 102   < > 98   < > 102 101 107  CO2 23   < > 27   < > 26   < > 27   < > 29   < > 27   < > 25 23* 26*  GLUCOSE 203*   < > 143*   < > 115*   < > 239*   < > 209*  --  171*  --  121*  --   --   BUN 25*   < > 26*   < > 24*   < > 26*   < > 20   < > 45*   < > 44* 65* 27*  CREATININE 1.48*   < > 1.58*   < > 1.47*   < > 1.56*   < > 1.35*   < > 1.70*   < > 1.62* 1.8* 1.6*  CALCIUM 8.2*   < > 9.0   < > 8.8*   < > 8.6*   < > 8.5*   < > 9.0   < > 9.4 8.6* 8.6*  MG 1.7   < > 2.3  --  1.7  --  1.8  --   --   --   --   --   --   --   --   PHOS 3.6  --   --   --   --   --   --   --   --   --   --   --   --   --   --    < > = values in this interval not displayed.   Recent Labs    02/07/19 1323 02/07/19 1323 02/24/19 1020 07/17/19 2150 11/25/19 0000  AST 13*   < > $R'16 17 14  'Cl$ ALT 12   < > $R'13 13 15  'TM$ ALKPHOS 96   < > 125 141* 140*  BILITOT 1.0  --  0.6 0.8  --   PROT 6.5  --  7.8 7.6  --   ALBUMIN 3.1*   < > 3.4* 3.4* 2.9*   < > = values in this interval not displayed.   Recent Labs    07/17/19 2150 07/17/19 2150 07/19/19 0219 07/19/19 0219 07/20/19 0723 08/02/19 0000 08/08/19 0000  09/20/19 0000 11/25/19 0000  WBC 18.9*   < > 16.1*   < > 9.2   < > 7.7 8.0 6.9  NEUTROABS 17.3*  --   --   --   --    < > $R'5 5 4  'Pg$ HGB 12.8   < > 10.3*   < > 10.3*   < > 10.9* 11.0* 10.3*  HCT 40.0   < > 32.2*   < > 32.4*   < > 33* 34* 31*  MCV 94.8  --  93.9  --  93.9  --   --   --   --   PLT 256   < > 184   < > 193   < >  299 246 254   < > = values in this interval not displayed.   Lab Results  Component Value Date   TSH 1.630 02/07/2019   Lab Results  Component Value Date   HGBA1C 9 07/14/2019   Lab Results  Component Value Date   CHOL 99 02/07/2019   HDL 26 (L) 02/07/2019   LDLCALC 50 02/07/2019   TRIG 117 02/07/2019   CHOLHDL 3.8 02/07/2019    Significant Diagnostic Results in last 30 days:  DG Foot Complete Right  Result Date: 11/24/2019 Please see detailed radiograph report in office note.  VAS [place] ABI WITH/WO TBI  Result Date: 11/29/2019 LOWER EXTREMITY DOPPLER STUDY Indications: Ulceration, and peripheral artery disease. High Risk Factors: Hypertension, hyperlipidemia, Diabetes, no history of                    smoking, prior CVA. Other Factors: Patient has a bandaged ulcer on her right great toe.  Comparison Study: Prior ABI performed 06/25/2017 at [facility] [place] Right: 1.23 ABI/ 0.70 TBI with biphasic arterial                   Doppler waveforms at the ankle and Left: 1.38 ABI/ 0.70 TBI                   with biphasic arterial Doppler waveforms at the ankle. Performing Technologist: Delorise Shiner RVT  Examination Guidelines: A complete evaluation includes at minimum, Doppler waveform signals and systolic blood pressure reading at the level of bilateral brachial, anterior tibial, and posterior tibial arteries, when vessel segments are accessible. Bilateral testing is considered an integral part of a complete examination. Photoelectric Plethysmograph (PPG) waveforms and toe systolic pressure readings are included as required and additional duplex  testing as needed. Limited examinations for reoccurring indications may be performed as noted.  ABI Findings: +---------+----------------+-----+------------------+--------------------------+ Right    Rt Pressure     IndexWaveform          Comment                             (mmHg)                                                            +---------+----------------+-----+------------------+--------------------------+ Brachial 147                                                               +---------+----------------+-----+------------------+--------------------------+ ATA      240             1.63                                              +---------+----------------+-----+------------------+--------------------------+ PTA      255  1.73 dampened                                                                   monophasic                                   +---------+----------------+-----+------------------+--------------------------+ DP                            monophasic                                   +---------+----------------+-----+------------------+--------------------------+ Great Toe                     Normal            Could not obtain, open                                                     wound                      +---------+----------------+-----+------------------+--------------------------+ +---------+------------------+-----+----------+-----------------------+ Left     Lt Pressure (mmHg)IndexWaveform  Comment                 +---------+------------------+-----+----------+-----------------------+ Brachial                                  Not obtained mastectomy +---------+------------------+-----+----------+-----------------------+ ATA      255               1.73                                   +---------+------------------+-----+----------+-----------------------+ PTA      255               1.73  monophasic                        +---------+------------------+-----+----------+-----------------------+ DP                              triphasic                         +---------+------------------+-----+----------+-----------------------+ Great Toe103               0.70                                   +---------+------------------+-----+----------+-----------------------+ +-------+----------------+------------+------------+------------+ ABI/TBIToday's ABI     Today's TBI Previous ABIPrevious TBI +-------+----------------+------------+------------+------------+ Right  Non compressibleNot obtained1.23        0.70         +-------+----------------+------------+------------+------------+ Left   Non compressible0.70  1.38        0.70         +-------+----------------+------------+------------+------------+  TOES Findings:  On the right, PPG signals are present in all five digits. See attached images.   Left ABIs appear essentially unchanged compared to prior study on 06/25/2017. Right ABI's were non compressible with monophasic arterial Doppler signals at the ankle suggesting progression of medial calcification compared to prior exam.  Summary: Right: Resting right ankle-brachial index indicates noncompressible right lower extremity arteries. Left: Resting left ankle-brachial index indicates noncompressible left lower extremity arteries. The left toe-brachial index is normal. LT Great toe pressure = 103 mmHg.  *See table(s) above for measurements and observations.  Electronically signed by Monica Martinez MD on 11/29/2019 at 1:12:38 PM.    Final     Assessment/Plan  1. Osteomyelitis of great toe of right foot (HCC) - completed Augmentin and Cipro therapy -  Will start Doxycycline 100 mg BID X 2 weeks and Florastor 250 mg 1 capsule BID X 17days -  Continue wound treatment daily  2. Neuropathy -  Continue Neurontin  3. Type 2 diabetes mellitus with stage 3b  chronic kidney disease, with long-term current use of insulin ([place]) Lab Results  Component Value Date   HGBA1C 9 07/14/2019   - stable, continue [employer] staff Communication: Discussed plan of care with resident and charge nurse.  Labs/tests ordered: None  Goals of care:   Long-term care  Durenda Age, DNP, MSN, FNP-BC [facility] [phone] (Monday-Friday 8:00 a.m. - 5:00 p.m.) [phone] (after hours)

## 2019-12-29 ENCOUNTER — Encounter: Payer: Self-pay | Admitting: Sports Medicine

## 2019-12-29 ENCOUNTER — Other Ambulatory Visit: Payer: Self-pay

## 2019-12-29 ENCOUNTER — Ambulatory Visit (INDEPENDENT_AMBULATORY_CARE_PROVIDER_SITE_OTHER): Payer: Medicare Other | Admitting: Sports Medicine

## 2019-12-29 DIAGNOSIS — I739 Peripheral vascular disease, unspecified: Secondary | ICD-10-CM

## 2019-12-29 DIAGNOSIS — M869 Osteomyelitis, unspecified: Secondary | ICD-10-CM | POA: Diagnosis not present

## 2019-12-29 DIAGNOSIS — E0800 Diabetes mellitus due to underlying condition with hyperosmolarity without nonketotic hyperglycemic-hyperosmolar coma (NKHHC): Secondary | ICD-10-CM

## 2019-12-29 DIAGNOSIS — L97511 Non-pressure chronic ulcer of other part of right foot limited to breakdown of skin: Secondary | ICD-10-CM

## 2019-12-29 DIAGNOSIS — L89610 Pressure ulcer of right heel, unstageable: Secondary | ICD-10-CM | POA: Diagnosis not present

## 2019-12-29 DIAGNOSIS — I2510 Atherosclerotic heart disease of native coronary artery without angina pectoris: Secondary | ICD-10-CM

## 2019-12-29 NOTE — Progress Notes (Signed)
Subjective: [name] is a 74 y.o. female patient seen in office for evaluation of ulceration of the right great toe and a new ulcer to the right heel.  Patient is from [facility] and does not recall much information but previous and did not bring in any additional paperwork for me to send order so I will attempt to call the facility.  Patient admits a little pain in her heel that she notices when physical therapy tries to make her walk.  Patient denies nausea vomiting fever chills or any other constitutional symptoms at this time.  Patient Active Problem List   Diagnosis Date Noted  . Skin pustule 10/20/2019  . Neurocognitive deficits 08/04/2019  . Controlled type 2 diabetes mellitus without complication ([place]) 40/97/3532  . CVA (cerebral vascular accident) ([place]) 07/23/2019  . Infection of pacemaker lead wire ([place]) 07/21/2019  . Fever   . History of transcatheter aortic valve replacement (TAVR)   . Septic encephalopathy 07/18/2019  . Sacral decubitus ulcer, stage II ([place]) 07/18/2019  . Bacteremia due to group B Streptococcus 07/18/2019  . Sepsis ([place]) 07/18/2019  . Complete heart block ([place])   . Chronic diastolic congestive heart failure ([place]) 02/07/2019  . AKI (acute kidney injury) ([place]) 07/22/2017  . Peripheral arterial disease ([place]) 06/18/2017  . B12 deficiency 05/01/2017  . Bilateral lower extremity edema 11/08/2015  . Aortic atherosclerosis ([place]) 06/07/2014  . Abnormality of gait 05/29/2012  . Constipation 03/16/2012  . HOCM (hypertrophic obstructive cardiomyopathy) ([place]) 06/11/2011  . Routine health maintenance 06/10/2010  . Hyperlipidemia   . Refusal of blood transfusions as patient is Jehovah's Witness   . History of adenocarcinoma of breast   . Osteoporosis   . Status post CVA   . Aortic stenosis s/p Tissure AVR 2006   . CAD (coronary artery disease)   . Hypertension   . Depression 01/23/2006  . DM (diabetes mellitus) ([place]) 03/25/1990   . Benign hypertensive heart and kidney disease with diastolic CHF, NYHA class 3 and CKD stage 3 ([place]) 1992  . Bradycardia 1992   Current Outpatient Medications on File Prior to Visit  Medication Sig Dispense Refill  . acetaminophen (TYLENOL) 325 MG tablet Take 650 mg by mouth every 6 (six) hours as needed.     Marland Kitchen acetaminophen (TYLENOL) 325 MG tablet Take 650 mg by mouth in the morning.    Marland Kitchen amLODipine (NORVASC) 5 MG tablet Take 1 tablet (5 mg total) by mouth daily. 90 tablet 3  . amoxicillin (AMOXIL) 500 MG capsule Take 2,000 mg by mouth as needed. Prior to dental procedure    . aspirin 81 MG EC tablet TAKE 1 TABLET (81 MG TOTAL) BY MOUTH DAILY. SWALLOW WHOLE. 30 tablet 3  . atorvastatin (LIPITOR) 80 MG tablet Take 1 tablet (80 mg total) by mouth at bedtime. 90 tablet 3  . clopidogrel (PLAVIX) 75 MG tablet Take 1 tablet (75 mg total) by mouth daily with breakfast. 90 tablet 3  . diclofenac Sodium (VOLTAREN) 1 % GEL Apply 4 g topically 4 (four) times daily as needed (Pain). Apply to bilateral hips and knees    . EASY TOUCH PEN NEEDLES 31G X 5 MM MISC USE TO INJECT INSULIN INTO THE SKIN (Patient not taking: Reported on 10/11/2019)    . furosemide (LASIX) 80 MG tablet Take 80 mg by mouth 2 (two) times daily.    Marland Kitchen gabapentin (NEURONTIN) 100 MG capsule Take 1 capsule (100 mg total) by mouth at bedtime. 90 capsule  1  . hydrALAZINE (APRESOLINE) 50 MG tablet Take 1 tablet (50 mg total) by mouth 3 (three) times daily. 270 tablet 0  . Insulin Degludec-Liraglutide (XULTOPHY) 100-3.6 UNIT-MG/ML SOPN Inject 14 Units into the skin daily.    Marland Kitchen lactulose (CHRONULAC) 10 GM/15ML solution Take by mouth 2 (two) times daily. 30 ml    . metoprolol succinate (TOPROL-XL) 25 MG 24 hr tablet Take 1 tablet (25 mg total) by mouth daily. 90 tablet 1  . mupirocin ointment (BACTROBAN) 2 % Apply to wound after soaking BID 30 g 1  . PARoxetine (PAXIL) 30 MG tablet TAKE 1 TABLET BY MOUTH DAILY 30 tablet 11  . potassium  chloride SA (KLOR-CON) 20 MEQ tablet Take 20 mEq by mouth daily.     Marland Kitchen saccharomyces boulardii (FLORASTOR) 250 MG capsule Take 250 mg by mouth 2 (two) times daily.     No current facility-administered medications on file prior to visit.   Allergies  Allergen Reactions  . 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," as the patient is a Sales promotion account executive Witness    Recent Results (from the past 2160 hour(s))  HM HEPATITIS C SCREENING LAB     Status: None   Collection Time: 10/17/19 12:00 AM  Result Value Ref Range   HM Hepatitis Screen Negative-Validated     Comment: 0.08  HM HEPATITIS C SCREENING LAB     Status: None   Collection Time: 10/17/19 12:00 AM  Result Value Ref Range   HM Hepatitis Screen Negative-Validated     Comment: 0.08  WOUND CULTURE     Status: Abnormal   Collection Time: 11/24/19  9:03 AM   Specimen: Wound  Result Value Ref Range   MICRO NUMBER: [id number]    SPECIMEN QUALITY: Adequate    SOURCE: RIGHT HALLUX    STATUS: FINAL    GRAM STAIN:      Moderate White blood cells seen No epithelial cells seen Many Gram positive cocci in pairs Many Gram negative bacilli   ISOLATE 1: Morganella morganii (A)     Comment: Heavy growth of Morganella morganii   ISOLATE 2: Enterobacter cloacae (A)     Comment: Heavy growth of Enterobacter cloacae      Susceptibility   Enterobacter cloacae - AEROBIC CULT, GRAM STAIN NEGATIVE 2    AMOX/CLAVULANIC 8 Resistant     CEFAZOLIN >=64 Resistant     CEFEPIME <=1 Sensitive     CEFTRIAXONE 8 Resistant     CIPROFLOXACIN <=0.25 Sensitive     LEVOFLOXACIN 1 Intermediate     ERTAPENEM <=0.5 Sensitive     GENTAMICIN >=16 Resistant     IMIPENEM <=0.25 Sensitive     PIP/TAZO <=4 Sensitive     TOBRAMYCIN 8 Intermediate     TRIMETH/SULFA* >=320 Resistant      * For infections other than uncomplicated UTIcaused by E. coli, K. pneumoniae or P. mirabilis:Cefazolin is resistant if MIC > or = 8 mcg/mL.(Distinguishing susceptible versus  intermediatefor isolates with MIC < or = 4 mcg/mL requiresadditional testing.)Legend:S = Susceptible  I = IntermediateR = Resistant  NS = Not susceptible* = Not tested  NR = Not reported**NN = See antimicrobic comments   Morganella morganii - AEROBIC CULT, GRAM STAIN NEGATIVE 1    AMOX/CLAVULANIC >=32 Resistant     AMPICILLIN >=32 Resistant     AMPICILLIN/SULBACTAM >=32 Resistant     CEFAZOLIN* >=64 Resistant      * For infections other than uncomplicated UTIcaused by E. coli,  K. pneumoniae or P. mirabilis:Cefazolin is resistant if MIC > or = 8 mcg/mL.(Distinguishing susceptible versus intermediatefor isolates with MIC < or = 4 mcg/mL requiresadditional testing.)    CEFEPIME <=1 Sensitive     CEFTRIAXONE <=1 Sensitive     CIPROFLOXACIN >=4 Resistant     LEVOFLOXACIN >=8 Resistant     ERTAPENEM <=0.5 Sensitive     GENTAMICIN <=1 Sensitive     PIP/TAZO <=4 Sensitive     TOBRAMYCIN <=1 Sensitive     TRIMETH/SULFA >=320 Resistant   CBC and differential     Status: Abnormal   Collection Time: 11/25/19 12:00 AM  Result Value Ref Range   Hemoglobin 10.3 (A) 12.0 - 16.0   HCT 31 (A) 36 - 46   Neutrophils Absolute 4    Platelets 254 150 - 399   WBC 6.9   CBC     Status: Abnormal   Collection Time: 11/25/19 12:00 AM  Result Value Ref Range   RBC 3.49 (A) 3.87 - 3.24  Basic metabolic panel     Status: Abnormal   Collection Time: 11/25/19 12:00 AM  Result Value Ref Range   Glucose 86    BUN 27 (A) 4 - 21   CO2 26 (A) 13 - 22   Creatinine 1.6 (A) 0.5 - 1.1   Potassium 4.4 3.4 - 5.3   Sodium 142 137 - 147   Chloride 107 99 - 108  Comprehensive metabolic panel     Status: Abnormal   Collection Time: 11/25/19 12:00 AM  Result Value Ref Range   Globulin 2.8    GFR calc Af Amer 34.88    GFR calc non Af Amer 30.09    Calcium 8.6 (A) 8.7 - 10.7   Albumin 2.9 (A) 3.5 - 5.0  Hepatic function panel     Status: Abnormal   Collection Time: 11/25/19 12:00 AM  Result Value Ref Range   Alkaline  Phosphatase 140 (A) 25 - 125   ALT 15 7 - 35   AST 14 13 - 35   Bilirubin, Total 0.2   Hepatic function panel     Status: Abnormal   Collection Time: 12/09/19 12:00 AM  Result Value Ref Range   Alkaline Phosphatase 149 (A) 25 - 125    Objective: There were no vitals filed for this visit.  General: Patient is awake, alert, oriented x 3 and in no acute distress.  Dermatology: Skin is warm and dry bilateral with a full thickness ulceration present distal tuft of the right great toe  Ulceration measures 2cm x 1 cm x 0.3 cm. There is a  Keratotic border with a fibro granular base. The ulceration seems to have granulated over the bone with the bone is not exposed anymore. There is no malodor, bloody active drainage, no erythema, localized edema. No other acute signs of infection.  To the right heel there is a dry eschar noted that measures 5-1/2 x 3-1/2 cm with clear to bloody active drainage there is mild edema but no malodor and localized edema present.   Vascular: Dorsalis Pedis pulse = 0/4 Bilateral,  Posterior Tibial pulse = 0/4 Bilateral,  Capillary Fill Time < 5 seconds  Neurologic: Protective sensation severely diminished with significant neuropathy pain worse at night.  Musculosketal: There is pain with palpation to ulcerated areas on the right.  Severe end-stage bunion deformity.  No pain with compression to calves bilateral.   Recent Labs    07/17/19 2150  LABORGA GROUP B STREP(S.AGALACTIAE)ISOLATED  Assessment and Plan:  Problem List Items Addressed This Visit      Endocrine   DM (diabetes mellitus) ([place])    Other Visit Diagnoses    Decubitus ulcer of heel, right, unstageable ([place])    -  Primary   Skin ulcer of right great toe, limited to breakdown of skin ([place])       Osteomyelitis of great toe of right foot ([place])       PAD (peripheral artery disease) ([place])         -Examined patient and discussed the progression of the wounds and treatment  alternatives. -Cleansed ulceration using a saline moistened gauze debrided any loose tissue to the area at the right great toe and heel but did advise patient due to her circulation we cannot do any type of aggressive debridement because she may not be able to heal it.  Hemostasis was achieved with manuel pressure. Patient tolerated nonselective debridement procedure well without any discomfort or anesthesia necessary for this wound debridement.  -Applied Betadine and dry sterile dressing and instructed patient to continue with daily dressings at  the facility daily; avoid antibiotic cream at this time -Continue with heel offloading when in bed to prevent worsening of ulceration -Continue with Doxycyline as given by facilty NP; discussed case with Louretta Parma, NP -Continue with surgical shoe and limited activity due to heel and toe ulcer on right -Continue with gabapentin 100 mg at bedtime for increased neuropathy pain -Patient is awaiting vascular follow-up has an appointment on 11/9 - Advised patient to go to the ER or return to office if the wound worsens or if constitutional symptoms are present. -Patient to return to office in 1 to 2 weeks for follow up care and evaluation or sooner if problems arise.  Landis Martins, DPM

## 2020-01-03 ENCOUNTER — Encounter: Payer: Self-pay | Admitting: Vascular Surgery

## 2020-01-03 ENCOUNTER — Other Ambulatory Visit: Payer: Self-pay

## 2020-01-03 ENCOUNTER — Ambulatory Visit (INDEPENDENT_AMBULATORY_CARE_PROVIDER_SITE_OTHER): Payer: Medicare Other | Admitting: Vascular Surgery

## 2020-01-03 VITALS — BP 135/66 | HR 67 | Temp 97.2°F | Resp 20 | Ht 65.0 in | Wt 160.0 lb

## 2020-01-03 DIAGNOSIS — I7025 Atherosclerosis of native arteries of other extremities with ulceration: Secondary | ICD-10-CM
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
## 2020-01-04 ENCOUNTER — Other Ambulatory Visit: Payer: Self-pay | Admitting: Physician Assistant

## 2020-01-04 ENCOUNTER — Ambulatory Visit (HOSPITAL_COMMUNITY)
Admission: RE | Disposition: A | Discharge status: Home / Self Care | Payer: Self-pay | Source: Home / Self Care | Attending: Vascular Surgery

## 2020-01-04 ENCOUNTER — Observation Stay (HOSPITAL_COMMUNITY)
Admission: RE | Admit: 2020-01-04 | Discharge: 2020-01-05 | Disposition: A | Discharge status: Home / Self Care | Payer: Medicare Other | Attending: Vascular Surgery | Admitting: Vascular Surgery

## 2020-01-04 ENCOUNTER — Encounter (HOSPITAL_COMMUNITY): Payer: Self-pay | Admitting: Vascular Surgery

## 2020-01-04 DIAGNOSIS — E119 Type 2 diabetes mellitus without complications: Secondary | ICD-10-CM | POA: Diagnosis not present

## 2020-01-04 DIAGNOSIS — Z7901 Long term (current) use of anticoagulants: Secondary | ICD-10-CM | POA: Insufficient documentation

## 2020-01-04 DIAGNOSIS — Z20822 Contact with and (suspected) exposure to covid-19: Secondary | ICD-10-CM | POA: Diagnosis not present

## 2020-01-04 DIAGNOSIS — Z7982 Long term (current) use of aspirin: Secondary | ICD-10-CM | POA: Diagnosis not present

## 2020-01-04 DIAGNOSIS — N182 Chronic kidney disease, stage 2 (mild): Secondary | ICD-10-CM | POA: Diagnosis not present

## 2020-01-04 DIAGNOSIS — I5033 Acute on chronic diastolic (congestive) heart failure: Secondary | ICD-10-CM | POA: Diagnosis not present

## 2020-01-04 DIAGNOSIS — I998 Other disorder of circulatory system: Secondary | ICD-10-CM | POA: Diagnosis not present

## 2020-01-04 DIAGNOSIS — I70221 Atherosclerosis of native arteries of extremities with rest pain, right leg: Principal | ICD-10-CM | POA: Insufficient documentation

## 2020-01-04 DIAGNOSIS — I2510 Atherosclerotic heart disease of native coronary artery without angina pectoris: Secondary | ICD-10-CM | POA: Insufficient documentation

## 2020-01-04 DIAGNOSIS — I130 Hypertensive heart and chronic kidney disease with heart failure and stage 1 through stage 4 chronic kidney disease, or unspecified chronic kidney disease: Secondary | ICD-10-CM | POA: Diagnosis not present

## 2020-01-04 DIAGNOSIS — Z952 Presence of prosthetic heart valve: Secondary | ICD-10-CM

## 2020-01-04 DIAGNOSIS — I70229 Atherosclerosis of native arteries of extremities with rest pain, unspecified extremity: Secondary | ICD-10-CM

## 2020-01-04 HISTORY — PX: LOWER EXTREMITY ANGIOGRAPHY: CATH118251

## 2020-01-04 HISTORY — PX: PERIPHERAL VASCULAR BALLOON ANGIOPLASTY: CATH118281

## 2020-01-04 LAB — POCT I-STAT, CHEM 8
BUN: 56 mg/dL — ABNORMAL HIGH (ref 8–23)
Calcium, Ion: 1.08 mmol/L — ABNORMAL LOW (ref 1.15–1.40)
Chloride: 107 mmol/L (ref 98–111)
Creatinine, Ser: 2.8 mg/dL — ABNORMAL HIGH (ref 0.44–1.00)
Glucose, Bld: 109 mg/dL — ABNORMAL HIGH (ref 70–99)
HCT: 37 % (ref 36.0–46.0)
Hemoglobin: 12.6 g/dL (ref 12.0–15.0)
Potassium: 4.5 mmol/L (ref 3.5–5.1)
Sodium: 139 mmol/L (ref 135–145)
TCO2: 24 mmol/L (ref 22–32)

## 2020-01-04 LAB — HEMOGLOBIN A1C
Hgb A1c MFr Bld: 6.3 % — ABNORMAL HIGH (ref 4.8–5.6)
Mean Plasma Glucose: 134.11 mg/dL

## 2020-01-04 LAB — GLUCOSE, CAPILLARY
Glucose-Capillary: 106 mg/dL — ABNORMAL HIGH (ref 70–99)
Glucose-Capillary: 197 mg/dL — ABNORMAL HIGH (ref 70–99)
Glucose-Capillary: 94 mg/dL (ref 70–99)

## 2020-01-04 LAB — SARS CORONAVIRUS 2 BY RT PCR (HOSPITAL ORDER, PERFORMED IN ~~LOC~~ HOSPITAL LAB): SARS Coronavirus 2: NEGATIVE

## 2020-01-04 LAB — POCT ACTIVATED CLOTTING TIME: Activated Clotting Time: 274 seconds

## 2020-01-04 LAB — POTASSIUM: Potassium: 4.5 mmol/L (ref 3.5–5.1)

## 2020-01-04 SURGERY — LOWER EXTREMITY ANGIOGRAPHY
Anesthesia: LOCAL

## 2020-01-04 MED ORDER — FENTANYL CITRATE (PF) 100 MCG/2ML IJ SOLN
INTRAMUSCULAR | Status: AC
  Filled 2020-01-04: qty 2

## 2020-01-04 MED ORDER — CLOPIDOGREL BISULFATE 75 MG PO TABS
75.0000 mg | ORAL_TABLET | Freq: Every day | ORAL | Status: DC
  Administered 2020-01-05: 75 mg via ORAL
  Filled 2020-01-04: qty 1

## 2020-01-04 MED ORDER — DOXYCYCLINE HYCLATE 100 MG PO TABS
100.0000 mg | ORAL_TABLET | Freq: Two times a day (BID) | ORAL | Status: DC
  Administered 2020-01-04 – 2020-01-05 (×2): 100 mg via ORAL
  Filled 2020-01-04 (×2): qty 1

## 2020-01-04 MED ORDER — SODIUM CHLORIDE 0.9% FLUSH
3.0000 mL | Freq: Two times a day (BID) | INTRAVENOUS | Status: DC
  Administered 2020-01-04 – 2020-01-05 (×2): 3 mL via INTRAVENOUS

## 2020-01-04 MED ORDER — LIDOCAINE HCL (PF) 1 % IJ SOLN
INTRAMUSCULAR | Status: AC
  Filled 2020-01-04: qty 30

## 2020-01-04 MED ORDER — LACTULOSE 10 GM/15ML PO SOLN
20.0000 g | Freq: Two times a day (BID) | ORAL | Status: DC
  Filled 2020-01-04 (×2): qty 30

## 2020-01-04 MED ORDER — ACETAMINOPHEN 325 MG PO TABS
650.0000 mg | ORAL_TABLET | ORAL | Status: DC | PRN
  Filled 2020-01-04: qty 2

## 2020-01-04 MED ORDER — PROTAMINE SULFATE 10 MG/ML IV SOLN
INTRAVENOUS | Status: AC
  Filled 2020-01-04: qty 5

## 2020-01-04 MED ORDER — ATORVASTATIN CALCIUM 80 MG PO TABS
80.0000 mg | ORAL_TABLET | Freq: Every day | ORAL | Status: DC
  Administered 2020-01-04: 80 mg via ORAL
  Filled 2020-01-04: qty 1

## 2020-01-04 MED ORDER — HEPARIN (PORCINE) IN NACL 1000-0.9 UT/500ML-% IV SOLN
INTRAVENOUS | Status: AC
  Filled 2020-01-04: qty 500

## 2020-01-04 MED ORDER — ACETAMINOPHEN 325 MG PO TABS
650.0000 mg | ORAL_TABLET | Freq: Every morning | ORAL | Status: DC
  Administered 2020-01-05: 650 mg via ORAL
  Filled 2020-01-04: qty 2

## 2020-01-04 MED ORDER — IODIXANOL 320 MG/ML IV SOLN
INTRAVENOUS | Status: DC | PRN
  Administered 2020-01-04: 55 mL

## 2020-01-04 MED ORDER — SODIUM CHLORIDE 0.9 % IV SOLN: INTRAVENOUS | Status: DC

## 2020-01-04 MED ORDER — SODIUM CHLORIDE 0.9% FLUSH
3.0000 mL | INTRAVENOUS | Status: DC | PRN
  Administered 2020-01-05: 3 mL via INTRAVENOUS

## 2020-01-04 MED ORDER — AMLODIPINE BESYLATE 5 MG PO TABS
5.0000 mg | ORAL_TABLET | Freq: Every day | ORAL | Status: DC
  Administered 2020-01-05: 5 mg via ORAL
  Filled 2020-01-04: qty 1

## 2020-01-04 MED ORDER — FENTANYL CITRATE (PF) 100 MCG/2ML IJ SOLN
INTRAMUSCULAR | Status: DC | PRN
  Administered 2020-01-04: 25 ug via INTRAVENOUS
  Administered 2020-01-04: 50 ug via INTRAVENOUS
  Administered 2020-01-04: 25 ug via INTRAVENOUS

## 2020-01-04 MED ORDER — WHITE PETROLATUM EX OINT: TOPICAL_OINTMENT | CUTANEOUS | Status: DC | PRN

## 2020-01-04 MED ORDER — GABAPENTIN 100 MG PO CAPS
100.0000 mg | ORAL_CAPSULE | Freq: Every day | ORAL | Status: DC
  Administered 2020-01-04: 100 mg via ORAL
  Filled 2020-01-04: qty 1

## 2020-01-04 MED ORDER — LIDOCAINE HCL (PF) 1 % IJ SOLN
INTRAMUSCULAR | Status: DC | PRN
  Administered 2020-01-04: 15 mL

## 2020-01-04 MED ORDER — OXYCODONE HCL 5 MG PO TABS
5.0000 mg | ORAL_TABLET | ORAL | Status: DC | PRN
  Administered 2020-01-04: 10 mg via ORAL
  Filled 2020-01-04: qty 2

## 2020-01-04 MED ORDER — INSULIN DEGLUDEC-LIRAGLUTIDE 100-3.6 UNIT-MG/ML ~~LOC~~ SOPN: 14.0000 [IU] | PEN_INJECTOR | Freq: Every day | SUBCUTANEOUS | Status: DC

## 2020-01-04 MED ORDER — HYDRALAZINE HCL 20 MG/ML IJ SOLN: 5.0000 mg | INTRAMUSCULAR | Status: DC | PRN

## 2020-01-04 MED ORDER — HYDRALAZINE HCL 50 MG PO TABS
50.0000 mg | ORAL_TABLET | Freq: Three times a day (TID) | ORAL | Status: DC
  Administered 2020-01-04 – 2020-01-05 (×3): 50 mg via ORAL
  Filled 2020-01-04 (×3): qty 1

## 2020-01-04 MED ORDER — ASPIRIN EC 81 MG PO TBEC
81.0000 mg | DELAYED_RELEASE_TABLET | Freq: Every day | ORAL | Status: DC
  Administered 2020-01-05: 81 mg via ORAL
  Filled 2020-01-04: qty 1

## 2020-01-04 MED ORDER — POTASSIUM CHLORIDE CRYS ER 20 MEQ PO TBCR
20.0000 meq | EXTENDED_RELEASE_TABLET | Freq: Every day | ORAL | Status: DC
  Administered 2020-01-05: 20 meq via ORAL
  Filled 2020-01-04: qty 1

## 2020-01-04 MED ORDER — METOPROLOL SUCCINATE ER 25 MG PO TB24
25.0000 mg | ORAL_TABLET | Freq: Every day | ORAL | Status: DC
  Administered 2020-01-05: 25 mg via ORAL
  Filled 2020-01-04: qty 1

## 2020-01-04 MED ORDER — HEPARIN (PORCINE) IN NACL 1000-0.9 UT/500ML-% IV SOLN
INTRAVENOUS | Status: DC | PRN
  Administered 2020-01-04: 500 mL

## 2020-01-04 MED ORDER — WHITE PETROLATUM EX OINT
TOPICAL_OINTMENT | Freq: Every day | CUTANEOUS | Status: DC
  Administered 2020-01-04: 0.2 via TOPICAL
  Filled 2020-01-04: qty 28.35

## 2020-01-04 MED ORDER — INSULIN ASPART 100 UNIT/ML ~~LOC~~ SOLN
0.0000 [IU] | Freq: Three times a day (TID) | SUBCUTANEOUS | Status: DC
  Administered 2020-01-05: 2 [IU] via SUBCUTANEOUS

## 2020-01-04 MED ORDER — LABETALOL HCL 5 MG/ML IV SOLN: 10.0000 mg | INTRAVENOUS | Status: DC | PRN

## 2020-01-04 MED ORDER — PROTAMINE SULFATE 10 MG/ML IV SOLN
INTRAVENOUS | Status: DC | PRN
  Administered 2020-01-04: 5 mg via INTRAVENOUS
  Administered 2020-01-04: 25 mg via INTRAVENOUS

## 2020-01-04 MED ORDER — MIDAZOLAM HCL 2 MG/2ML IJ SOLN
INTRAMUSCULAR | Status: AC
  Filled 2020-01-04: qty 2

## 2020-01-04 MED ORDER — MIDAZOLAM HCL 2 MG/2ML IJ SOLN
INTRAMUSCULAR | Status: DC | PRN
  Administered 2020-01-04 (×3): 1 mg via INTRAVENOUS

## 2020-01-04 MED ORDER — WHITE PETROLATUM GEL
1.0000 "application " | Freq: Every day | Status: DC
  Filled 2020-01-04: qty 71

## 2020-01-04 MED ORDER — HEPARIN (PORCINE) IN NACL 1000-0.9 UT/500ML-% IV SOLN: INTRAVENOUS | Status: DC | PRN

## 2020-01-04 MED ORDER — ONDANSETRON HCL 4 MG/2ML IJ SOLN: 4.0000 mg | Freq: Four times a day (QID) | INTRAMUSCULAR | Status: DC | PRN

## 2020-01-04 MED ORDER — SODIUM CHLORIDE 0.9 % IV SOLN: 250.0000 mL | INTRAVENOUS | Status: DC | PRN

## 2020-01-04 MED ORDER — HEPARIN SODIUM (PORCINE) 1000 UNIT/ML IJ SOLN
INTRAMUSCULAR | Status: DC | PRN
  Administered 2020-01-04: 5000 [IU] via INTRAVENOUS
  Administered 2020-01-04: 3000 [IU] via INTRAVENOUS

## 2020-01-04 SURGICAL SUPPLY — 25 items
BAG SNAP BAND KOVER 36X36 (MISCELLANEOUS) ×9 IMPLANT
BALLN STERLING OTW 3X220X150 (BALLOONS) ×3
BALLOON STERLING OTW 3X220X150 (BALLOONS) ×2 IMPLANT
CATH OMNI FLUSH 5F 65CM (CATHETERS) ×3 IMPLANT
CATH QUICKCROSS .018X135CM (MICROCATHETER) ×3 IMPLANT
COVER DOME SNAP 22 D (MISCELLANEOUS) ×3 IMPLANT
DEVICE CLOSURE MYNXGRIP 6/7F (Vascular Products) ×3 IMPLANT
DEVICE TORQUE H2O (MISCELLANEOUS) ×3 IMPLANT
FILTER CO2 0.2 MICRON (VASCULAR PRODUCTS) ×3 IMPLANT
GUIDEWIRE ANGLED .035X150CM (WIRE) ×3 IMPLANT
KIT ENCORE 26 ADVANTAGE (KITS) ×3 IMPLANT
KIT MICROPUNCTURE NIT STIFF (SHEATH) ×3 IMPLANT
KIT PV (KITS) ×3 IMPLANT
RESERVOIR CO2 (VASCULAR PRODUCTS) ×3 IMPLANT
SET FLUSH CO2 (MISCELLANEOUS) ×3 IMPLANT
SHEATH PINNACLE 5F 10CM (SHEATH) ×3 IMPLANT
SHEATH PINNACLE 6F 10CM (SHEATH) ×3 IMPLANT
SHEATH PINNACLE MP 6F 45CM (SHEATH) ×3 IMPLANT
SHEATH PROBE COVER 6X72 (BAG) ×3 IMPLANT
SYR MEDRAD MARK V 150ML (SYRINGE) ×3 IMPLANT
TRANSDUCER W/STOPCOCK (MISCELLANEOUS) ×3 IMPLANT
TRAY PV CATH (CUSTOM PROCEDURE TRAY) ×3 IMPLANT
WIRE BENTSON .035X145CM (WIRE) ×3 IMPLANT
WIRE G V18X300CM (WIRE) ×3 IMPLANT
WIRE ROSEN-J .035X260CM (WIRE) ×3 IMPLANT

## 2020-01-04 NOTE — Interval H&P Note (Signed)
History and Physical Interval Note:  01/04/2020 2:34 PM  [name]  has presented today for surgery, with the diagnosis of ischemia.  The various methods of treatment have been discussed with the patient and family. After consideration of risks, benefits and other options for treatment, the patient has consented to  Procedure(s): LOWER EXTREMITY ANGIOGRAPHY - Right (N/A) as a surgical intervention.  The patient's history has been reviewed, patient examined, no change in status, stable for surgery.  I have reviewed the patient's chart and labs.  Questions were answered to the patient's satisfaction.     Cherre Robins

## 2020-01-04 NOTE — Plan of Care (Signed)
  Problem: Clinical Measurements: Goal: Postoperative complications will be avoided or minimized Outcome: Progressing   Problem: Skin Integrity: Goal: Demonstration of wound healing without infection will improve Outcome: Progressing   

## 2020-01-04 NOTE — Op Note (Addendum)
DATE OF SERVICE: 01/04/2020  PATIENT:  [name]  74 y.o. female  PRE-OPERATIVE DIAGNOSIS:  Critical limb ischemia of right lower extremity with tissue loss  POST-OPERATIVE DIAGNOSIS:  * No post-op diagnosis entered *  PROCEDURE:   1) US guided LCFA access 2) Aortogram 3) Right lower extremity third order cannulation and angiogram 4) angioplasty of right anterior tibial artery (3x227mm Sterling x   SURGEON:  Surgeon(s) and Role:    * Cherre Robins, MD - Primary  ASSISTANT: none  ANESTHESIA:   local and IV sedation  EBL: min  BLOOD ADMINISTERED:none  DRAINS: none   LOCAL MEDICATIONS USED:  LIDOCAINE   SPECIMEN:  none  DISPOSITION OF SPECIMEN:  n/a  COUNTS: confirmed correct.  TOURNIQUET:  * No tourniquets in log *  PLAN OF CARE: Admit for overnight observation  PATIENT DISPOSITION:  PACU - hemodynamically stable.   Delay start of Pharmacological VTE agent (>24hrs) due to surgical blood loss or risk of bleeding: no  INDICATION FOR PROCEDURE: [name] is a 74 y.o. female with critical limb ischemia with tissue loss of right lower extremity. After careful discussion of risks, benefits, and alternatives the patient was offered. The patient understood and wished to proceed.  DESCRIPTION OF PROCEDURE: After identification of the patient in the pre-operative holding area, the patient was transferred to the operating room. The patient was positioned supine on the operating room table. Anesthesia was induced. The groins were prepped and draped in standard fashion. A surgical pause was performed confirming correct patient, procedure, and operative location.  To begin the left groin was anesthetized with 1% lidocaine. Using ultrasound guidance, the left common femoral artery was accessed with micropuncture technique. Fluoroscopy was used to confirm cannulation over the femoral head. Sheathogram was not performed. The 747F sheath was upsized to 47F.   An 035 Britta Mccreedy  wire was advanced into the distal aorta. Over the wire an omni flush catheter was advanced to the level of L2. Aortogram was performed with CO2 revealing no hemodynamically significant stenosis.   The right common iliac artery was selected with an 035 floppy angled glidewire. The wire was advanced into the superficial femoral artery. Over the wire the omni flush catheter was advanced into the external iliac artery. Selective angiography was performed revealing:  Focal stenosis of the common femoral artery >75% No hemodynamically significant stenosis in SFA, popliteal artery All tibial arteries occluded at their origin Reconstitution of the anterior tibial and peroneal artery at the ankle.  The decision was made to intervene. The patient was heparinized with 8000 units of heparin. The sheath was exchanged for a 28F x 45cm sheath. Selective angiography of the left lower extremity was performed prior to intervention. An 018 V18 wire was used to select the anterior tibial artery and the chronic total occlusion was crossed endoluminally with a crossing catheter. Selective angiography through the catheter confirmed endoluminal position. The lesions were treated with:  Angioplasty using a 3x267mm Sterling balloon with two separate treatments.  Completion angiography revealed:  Recanalization of the anterior tibial artery with inline flow to the foot  The sheath was exchanged for a short 28F sheath. A mynx device was used to close the arteriotomy. This maldeployed. Manual pressure was held. Hemostasis was excellent upon completion.  Upon completion of the case instrument and sharps counts were confirmed correct. The patient was transferred to the PACU in good condition. I was present for all portions of the procedure.  [name]. Stanford Breed, MD [facility] Phone Number: [phone] 01/04/2020 4:28 PM

## 2020-01-05 ENCOUNTER — Encounter (HOSPITAL_COMMUNITY): Payer: Self-pay | Admitting: Vascular Surgery

## 2020-01-05 DIAGNOSIS — I70221 Atherosclerosis of native arteries of extremities with rest pain, right leg: Secondary | ICD-10-CM | POA: Diagnosis not present

## 2020-01-05 LAB — GLUCOSE, CAPILLARY
Glucose-Capillary: 103 mg/dL — ABNORMAL HIGH (ref 70–99)
Glucose-Capillary: 158 mg/dL — ABNORMAL HIGH (ref 70–99)

## 2020-01-05 MED ORDER — OXYCODONE-ACETAMINOPHEN 5-325 MG PO TABS
1.0000 | ORAL_TABLET | ORAL | 0 refills | Status: DC | PRN
Start: 2020-01-05 — End: 2020-01-10

## 2020-01-05 MED FILL — Fentanyl Citrate Preservative Free (PF) Inj 100 MCG/2ML: INTRAMUSCULAR | Qty: 2 | Status: AC

## 2020-01-05 NOTE — TOC Transition Note (Signed)
Transition of Care [facility]) - CM/SW Discharge Note   Patient Details  Name: [name] MRN: [id number] Date of Birth: 11/11/1945  [facility] CM/SW Contact:  Vinie Sill, [place] Phone Number: 01/05/2020, 2:37 PM   Clinical Narrative:     Patient will DC to: [facility]  DC Date: 01/05/2020 Family Notified: [name],daughter Transport HN:[id number]   Per MD patient is ready for discharge. RN, patient, and facility notified of DC. Discharge Summary sent to facility. RN given number for [phone]. Ambulance transport requested for patient.   Clinical Social Worker signing off. Thurmond Butts, MSW, Industry Clinical Social Worker    Final next level of care: Skilled Nursing Facility Barriers to Discharge: Barriers Resolved   Patient Goals and CMS Choice        Discharge Placement              Patient chooses bed at: [facility] Patient to be transferred to facility by: [place] Name of family member notified: [name],daughter Patient and family notified of of transfer: 01/05/20  Discharge Plan and Services                                     Social Determinants of Health (SDOH) Interventions     Readmission Risk Interventions No flowsheet data found.

## 2020-01-05 NOTE — Discharge Summary (Signed)
[facility] Discharge Summary   Patient ID:  [name] MRN: [id number] DOB/AGE: Jul 05, 1945 74 y.o.  Admit date: 01/04/2020 Discharge date: 01/05/2020 Date of Surgery: 01/04/2020 Surgeon: Surgeon(s): Cherre Robins, MD  Admission Diagnosis: Critical lower limb ischemia [facility]) [I70.229]  Discharge Diagnoses:  Critical lower limb ischemia ([place]) [I70.229]  Secondary Diagnoses: Past Medical History:  Diagnosis Date  . Acute on chronic diastolic CHF (congestive heart failure) ([place]) 06/22/2017  . Acute on chronic renal failure ([place]) 05/25/2011  . Adenocarcinoma of breast ([place]) 1996   Completed tamoxifen and had mastectomy.  . Aortic stenosis, severe    s/p aortic valve replacement with porcine valve 06/2004.  ECHO 2010 EF 67%, LVH, diastolic dysfxn, Bioprostetic aoritc valve, mild AS. ECHO 2013 EF 60%, Nl aortic artificial valve, dynamic obstruction in the outflow tract   Class IIb rec for annual TTE after 5 yrs. She had a TTE 2013    . Arthritis   . CAD (coronary artery disease) 2006   s/p CABG (5/06) w/ saphenous vein to RCA at time of AVR  . CKD (chronic kidney disease) stage 2, GFR 60-89 ml/min 06/03/2011   She is no longer taking NSAIDs.   Marland Kitchen CVA (cerebral infarction) 2006   Post-op from AVR. Presumed embolic in nature. Carotid stenosis of R 60-79%. Repeat dopplers 4/10 no R stenosis and L stenosis of 1-29%.  . Depression    Controlled on Paxil  . Diabetes mellitus 1992   Dx 04/25/1990. Now insulin dependent, started 2008. On ACEI.   . Diverticulosis 2001  . History of 2019 novel coronavirus disease (COVID-19)   . Hyperlipidemia    Mgmt with a statin  . Hypertension    Requires 4 drug tx  . Osteoporosis 2006   DEXA 10/06 : L femur T -2.8, R -2.7. Lumbar T -2.4. On bisphosphonates and  Calcium / Vit D.  . Peripheral vascular disease ([place])   . Presence of permanent cardiac pacemaker   . Refusal of blood transfusions as patient is Jehovah's Witness   .  Stroke ([place])     Procedure(s): LOWER EXTREMITY ANGIOGRAPHY - Right PERIPHERAL VASCULAR BALLOON ANGIOPLASTY  Discharged Condition: stable  HPI: [name] is a 74 y.o. female with critical limb ischemia of RLE s/p angiography and recanalization of anterior tibial artery with balloon angioplasty.   Hospital Course:  [name] is a 75 y.o. female is S/P  Procedure(s): LOWER EXTREMITY ANGIOGRAPHY - Right PERIPHERAL VASCULAR BALLOON ANGIOPLASTY Appropriate for DC Continue ASA/Plavix indefinitely Follow up with me in 4 weeks with repeat ABI/Duplex  Left groin soft without hematoma, doppler signal right AT.     Significant Diagnostic Studies: CBC Lab Results  Component Value Date   WBC 6.9 11/25/2019   HGB 12.6 01/04/2020   HCT 37.0 01/04/2020   MCV 93.9 07/20/2019   PLT 254 11/25/2019    BMET    Component Value Date/Time   NA 139 01/04/2020 1222   NA 142 11/25/2019 0000   K 4.5 01/04/2020 1222   CL 107 01/04/2020 1222   CO2 26 (A) 11/25/2019 0000   GLUCOSE 109 (H) 01/04/2020 1222   BUN 56 (H) 01/04/2020 1222   BUN 27 (A) 11/25/2019 0000   CREATININE 2.80 (H) 01/04/2020 1222   CREATININE 0.92 06/07/2014 1151   CALCIUM 8.6 (A) 11/25/2019 0000   GFRNONAA 30.09 11/25/2019 0000   GFRNONAA 63 06/07/2014 1151   GFRAA 34.88 11/25/2019 0000   GFRAA 73 06/07/2014 1151  COAG Lab Results  Component Value Date   INR 0.9 07/17/2019   INR 1.0 02/24/2019     Disposition:  Discharge to :Skilled nursing facility Discharge Instructions    Discharge patient   Complete by: As directed    Discharge disposition: 01-Home or Self Care   Discharge patient date: 01/05/2020     Allergies as of 01/05/2020      Reactions   Other Other (See Comments)   NO "blood products," as the patient is a Jehovah's Witness      Medication List    STOP taking these medications   mupirocin ointment 2 % Commonly known as: Bactroban     TAKE these medications    acetaminophen 325 MG tablet Commonly known as: TYLENOL Take 650 mg by mouth every 6 (six) hours as needed. Notify MD id not relieved (Not to exceed 3000 mg ) in 24 hour per stranding order   acetaminophen 325 MG tablet Commonly known as: TYLENOL Take 650 mg by mouth in the morning.   amLODipine 5 MG tablet Commonly known as: NORVASC Take 1 tablet (5 mg total) by mouth daily.   amoxicillin 500 MG capsule Commonly known as: AMOXIL Take 2,000 mg by mouth as needed. Prior to dental procedure   aspirin 81 MG EC tablet TAKE 1 TABLET (81 MG TOTAL) BY MOUTH DAILY. SWALLOW WHOLE. What changed:   how much to take  how to take this  when to take this  additional instructions   atorvastatin 80 MG tablet Commonly known as: LIPITOR Take 1 tablet (80 mg total) by mouth at bedtime.   clopidogrel 75 MG tablet Commonly known as: PLAVIX Take 1 tablet (75 mg total) by mouth daily with breakfast.   diclofenac Sodium 1 % Gel Commonly known as: VOLTAREN Apply 4 g topically 4 (four) times daily as needed (Pain). Apply to bilateral hips and knees   doxycycline 100 MG tablet Commonly known as: VIBRA-TABS Take 100 mg by mouth 2 (two) times daily.   furosemide 80 MG tablet Commonly known as: LASIX Take 80 mg by mouth 2 (two) times daily.   gabapentin 100 MG capsule Commonly known as: NEURONTIN Take 1 capsule (100 mg total) by mouth at bedtime.   hydrALAZINE 50 MG tablet Commonly known as: APRESOLINE Take 1 tablet (50 mg total) by mouth 3 (three) times daily.   lactulose 10 GM/15ML solution Commonly known as: CHRONULAC Take 20 g by mouth 2 (two) times daily.   metoprolol succinate 25 MG 24 hr tablet Commonly known as: TOPROL-XL Take 1 tablet (25 mg total) by mouth daily.   oxyCODONE-acetaminophen 5-325 MG tablet Commonly known as: Percocet Take 1 tablet by mouth every 4 (four) hours as needed for severe pain.   PARoxetine 30 MG tablet Commonly known as: PAXIL TAKE 1 TABLET  BY MOUTH DAILY   potassium chloride SA 20 MEQ tablet Commonly known as: KLOR-CON Take 20 mEq by mouth daily.   Vaseline Gel Generic drug: white petrolatum Apply 1 application topically at bedtime. Bilateral heels   Xultophy 100-3.6 UNIT-MG/ML Sopn Generic drug: Insulin Degludec-Liraglutide Inject 14 Units into the skin daily.      Verbal and written Discharge instructions given to the patient. Wound care per Discharge AVS  Follow-up Information    Dorothy Spark, MD Follow up on 02/08/2020.   Specialty: Cardiology Why: @ 1:30 for an echo (ultrasound of your heart) followed by a visit with Dr. Meda Coffee @ 2:40pm  Contact information: [address] [place] [address] [phone]        Cherre Robins, MD Follow up.   Specialties: Vascular Surgery, Interventional Cardiology Why: 02/07/20. The office will contact the patient for their follow up appointment Contact information: [place] [address] [phone]               Signed: Roxy Horseman 01/05/2020, 11:31 AM

## 2020-01-05 NOTE — Progress Notes (Signed)
Patient leaving with [facility]. Report called to facility per request. Discharge paperwork given to [facility] staff for transfer to give to facility.   Donnelly Angelica, RN

## 2020-01-05 NOTE — Discharge Instructions (Signed)
  [facility]  Discharge Instructions  Lower Extremity Angiogram; Angioplasty/Stenting  Please refer to the following instructions for your post-procedure care. Your surgeon or physician assistant will discuss any changes with you.  Activity  Avoid lifting more than 8 pounds (1 gallons of milk) for 5 days after your procedure. You may walk as much as you can tolerate. It's OK to drive after 72 hours.  Bathing/Showering  You may shower the day after your procedure. If you have a bandage, you may remove it at 24- 48 hours. Clean your incision site with mild soap and water. Pat the area dry with a clean towel.  Diet  Resume your pre-procedure diet. There are no special food restrictions following this procedure. All patients with peripheral vascular disease should follow a low fat/low cholesterol diet. In order to heal from your surgery, it is CRITICAL to get adequate nutrition. Your body requires vitamins, minerals, and protein. Vegetables are the best source of vitamins and minerals. Vegetables also provide the perfect balance of protein. Processed food has little nutritional value, so try to avoid this.  Medications  Resume taking all of your medications unless your doctor tells you not to. If your incision is causing pain, you may take over-the-counter pain relievers such as acetaminophen (Tylenol)  Follow Up  Follow up will be arranged at the time of your procedure. You may have an office visit scheduled or may be scheduled for surgery. Ask your surgeon if you have any questions.  Please call us immediately for any of the following conditions: .Severe or worsening pain your legs or feet at rest or with walking. .Increased pain, redness, drainage at your groin puncture site. .Fever of 101 degrees or higher. .If you have any mild or slow bleeding from your puncture site: lie down, apply firm constant pressure over the area with a piece of gauze or a  clean wash cloth for 30 minutes- no peeking!, call 911 right away if you are still bleeding after 30 minutes, or if the bleeding is heavy and unmanageable.  Reduce your risk factors of vascular disease:  . Stop smoking. If you would like help call [facility] at 1-800-QUIT-NOW 715-835-8717) or [place] at [phone]. . Manage your cholesterol . Maintain a desired weight . Control your diabetes . Keep your blood pressure down .  If you have any questions, please call the office at [phone]

## 2020-01-05 NOTE — Progress Notes (Signed)
   ASSESSMENT & PLAN:  [name] is a 74 y.o. female with critical limb ischemia of RLE s/p angiography and recanalization of anterior tibial artery with balloon angioplasty.   Appropriate for DC Continue ASA/Plavix indefinitely Follow up with me in 4 weeks with repeat ABI/Duplex  SUBJECTIVE:  No complaints. Right foot felt better last night.  OBJECTIVE:  BP (!) 110/46 (BP Location: Right Arm)   Pulse 60   Temp 97.8 F (36.6 C) (Oral)   Resp 13   Ht $R'5\' 5"'Aw$  (1.651 m)   Wt 73.5 kg   SpO2 100%   BMI 26.96 kg/m   Intake/Output Summary (Last 24 hours) at 01/05/2020 0752 Last data filed at 01/05/2020 0655 Gross per 24 hour  Intake -  Output 600 ml  Net -600 ml    Urine output over past 24 hours: 600  Constitutional: well appearing. no acute distress. Cardiac: rrr. Vascular: left groin soft, strongly biphasic DS in R AT Pulmonary: unlabored   CBC Latest Ref Rng & Units 01/04/2020 11/25/2019 09/20/2019  WBC - - 6.9 8.0  Hemoglobin 12.0 - 15.0 g/dL 12.6 10.3(A) 11.0(A)  Hematocrit 36 - 46 % 37.0 31(A) 34(A)  Platelets 150 - 399 - 254 246     CMP Latest Ref Rng & Units 01/04/2020 01/04/2020 12/09/2019  Glucose 70 - 99 mg/dL 109(H) - -  BUN 8 - 23 mg/dL 56(H) - -  Creatinine 0.44 - 1.00 mg/dL 2.80(H) - -  Sodium 135 - 145 mmol/L 139 - -  Potassium 3.5 - 5.1 mmol/L 4.5 4.5 -  Chloride 98 - 111 mmol/L 107 - -  CO2 13 - 22 - - -  Calcium 8.7 - 10.7 - - -  Total Protein 6.5 - 8.1 g/dL - - -  Total Bilirubin 0.3 - 1.2 mg/dL - - -  Alkaline Phos 25 - 125 - - 149(A)  AST 13 - 35 - - -  ALT 7 - 35 - - -    Estimated Creatinine Clearance: 17.2 mL/min (A) (by C-G formula based on SCr of 2.8 mg/dL (H)).  [name]. Stanford Breed, MD [facility] Phone Number: [phone] 01/05/2020 7:53 AM

## 2020-01-06 ENCOUNTER — Non-Acute Institutional Stay (SKILLED_NURSING_FACILITY): Payer: Medicare Other | Admitting: Adult Health

## 2020-01-06 ENCOUNTER — Encounter (HOSPITAL_COMMUNITY): Payer: Self-pay | Admitting: Vascular Surgery

## 2020-01-06 DIAGNOSIS — T148XXA Other injury of unspecified body region, initial encounter: Secondary | ICD-10-CM

## 2020-01-06 DIAGNOSIS — I70229 Atherosclerosis of native arteries of extremities with rest pain, unspecified extremity: Secondary | ICD-10-CM

## 2020-01-06 DIAGNOSIS — G629 Polyneuropathy, unspecified: Secondary | ICD-10-CM | POA: Diagnosis not present

## 2020-01-06 DIAGNOSIS — N1832 Chronic kidney disease, stage 3b: Secondary | ICD-10-CM

## 2020-01-06 DIAGNOSIS — L089 Local infection of the skin and subcutaneous tissue, unspecified: Secondary | ICD-10-CM

## 2020-01-06 DIAGNOSIS — E1122 Type 2 diabetes mellitus with diabetic chronic kidney disease: Secondary | ICD-10-CM | POA: Diagnosis not present

## 2020-01-06 DIAGNOSIS — Z794 Long term (current) use of insulin: Secondary | ICD-10-CM

## 2020-01-06 DIAGNOSIS — Z8673 Personal history of transient ischemic attack (TIA), and cerebral infarction without residual deficits: Secondary | ICD-10-CM

## 2020-01-06 NOTE — Progress Notes (Signed)
Location:  [place] Room Number: 217-A Place of Service:  SNF (31) Provider:  Durenda Age, DNP, FNP-BC  Patient Care Team: Hendricks Limes, MD as PCP - General (Internal Medicine) Dorothy Spark, MD as PCP - Cardiology (Cardiology) Constance Haw, MD as PCP - Electrophysiology (Cardiology) Medina-Vargas, Senaida Lange, NP as Nurse Practitioner (Internal Medicine) Rehab, [facility] ([place])  Extended Emergency Contact Information Primary Emergency Contact: Lieutenant Diego [place] of [place] Phone: [phone] Mobile Phone: [phone] Relation: Daughter Secondary Emergency Contact: Armstrong,[name]  [place] Phone: [phone] Relation: Daughter  Code Status:  FULL CODE  Goals of care: Advanced Directive information Advanced Directives 01/04/2020  Does Patient Have a Medical Advance Directive? No  Type of Advance Directive -  Does patient want to make changes to medical advance directive? -  Copy of [place] in Chart? -  Would patient like information on creating a medical advance directive? No - Patient declined  Pre-existing out of facility DNR order (yellow form or pink MOST form) -     Chief Complaint  Patient presents with  . Acute Visit    Patient is seen for right heel wound, status post angiogram    HPI:  Pt is a 74 y.o. female seen today for right heel wound and S/P angiogram.  She is a long-term care resident of [facility].  She is a long-term care resident of [facility].  She has a PMH of chronic kidney disease, CAD, severe aortic stenosis, history of breast cancer, essential hypertension, dyslipidemia, stroke and chronic diastolic congestive heart failure. She had critical right lower limb ischemia S/P angiography and recanalization of anterior tibial artery with balloon angioplasty on  01/04/20. She remains to be on Doxycycline for right heel wound. CBGs ranging from76  To 266. She takes Xultophy 14 units daily for diabetes mellitus.   Past Medical History:  Diagnosis Date  . Acute on chronic diastolic CHF (congestive heart failure) ([place]) 06/22/2017  . Acute on chronic renal failure ([place]) 05/25/2011  . Adenocarcinoma of breast ([place]) 1996   Completed tamoxifen and had mastectomy.  . Aortic stenosis, severe    s/p aortic valve replacement with porcine valve 06/2004.  ECHO 2010 EF 15%, LVH, diastolic dysfxn, Bioprostetic aoritc valve, mild AS. ECHO 2013 EF 60%, Nl aortic artificial valve, dynamic obstruction in the outflow tract   Class IIb rec for annual TTE after 5 yrs. She had a TTE 2013    . Arthritis   . CAD (coronary artery disease) 2006   s/p CABG (5/06) w/ saphenous vein to RCA at time of AVR  . CKD (chronic kidney disease) stage 2, GFR 60-89 ml/min 06/03/2011   She is no longer taking NSAIDs.   Marland Kitchen CVA (cerebral infarction) 2006   Post-op from AVR. Presumed embolic in nature. Carotid stenosis of R 60-79%. Repeat dopplers 4/10 no R stenosis and L stenosis of 1-29%.  . Depression    Controlled on Paxil  . Diabetes mellitus 1992   Dx 04/25/1990. Now insulin dependent, started 2008. On ACEI.   . Diverticulosis 2001  . History of 2019 novel coronavirus disease (COVID-19)   . Hyperlipidemia    Mgmt with a statin  . Hypertension    Requires 4 drug tx  . Osteoporosis 2006   DEXA 10/06 : L femur T -2.8, R -2.7. Lumbar T -2.4. On bisphosphonates and  Calcium / Vit D.  .  Peripheral vascular disease ([place])   . Presence of permanent cardiac pacemaker   . Refusal of blood transfusions as patient is Jehovah's Witness   . Stroke [facility])    Past Surgical History:  Procedure Laterality Date  . ABDOMINAL HYSTERECTOMY  1987   for fibroids  . AORTIC VALVE REPLACEMENT  2006  . CHOLECYSTECTOMY    . CORONARY ARTERY BYPASS GRAFT  2006   Saphenous vein to RCA at time of AVR. Course  complicated by acute respiratory failure, post-op PTX, ARI, ileus, CVA  . LOWER EXTREMITY ANGIOGRAPHY N/A 01/04/2020   Procedure: LOWER EXTREMITY ANGIOGRAPHY - Right;  Surgeon: Cherre Robins, MD;  Location: [place] CV LAB;  Service: Cardiovascular;  Laterality: N/A;  . MASTECTOMY Left 1995   L for adenocarcinoma  . PACEMAKER IMPLANT N/A 02/09/2019   Procedure: PACEMAKER IMPLANT;  Surgeon: Constance Haw, MD;  Location: [place] CV LAB;  Service: Cardiovascular;  Laterality: N/A;  . PERIPHERAL VASCULAR BALLOON ANGIOPLASTY  01/04/2020   Procedure: PERIPHERAL VASCULAR BALLOON ANGIOPLASTY;  Surgeon: Cherre Robins, MD;  Location: [place] [facility] LAB;  Service: Cardiovascular;;  Rt AT  . RIGHT HEART CATH AND CORONARY/GRAFT ANGIOGRAPHY N/A 02/09/2019   Procedure: RIGHT HEART CATH AND CORONARY/GRAFT ANGIOGRAPHY;  Surgeon: Sherren Mocha, MD;  Location: [place] CV LAB;  Service: Cardiovascular;  Laterality: N/A;  . TEE WITHOUT CARDIOVERSION N/A 07/20/2019   Procedure: TRANSESOPHAGEAL ECHOCARDIOGRAM (TEE);  Surgeon: Jerline Pain, MD;  Location: [facility] ENDOSCOPY;  Service: Cardiovascular;  Laterality: N/A;  . TRANSCATHETER AORTIC VALVE REPLACEMENT, TRANSFEMORAL N/A 03/01/2019   Procedure: TRANSCATHETER AORTIC VALVE REPLACEMENT, TRANSFEMORAL;  Surgeon: Sherren Mocha, MD;  Location: [place] CV LAB;  Service: Open Heart Surgery;  Laterality: N/A;    Allergies  Allergen Reactions  . 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," as the patient is a Jehovah's Witness    Outpatient Encounter Medications as of 01/06/2020  Medication Sig  . acetaminophen (TYLENOL) 325 MG tablet Take 650 mg by mouth every 6 (six) hours as needed. Notify MD id not relieved (Not to exceed 3000 mg ) in 24 hour per stranding order  . acetaminophen (TYLENOL) 325 MG tablet Take 650 mg by mouth in the morning.  Marland Kitchen amLODipine (NORVASC) 5 MG tablet Take 1 tablet (5 mg total) by mouth daily.  Marland Kitchen amoxicillin  (AMOXIL) 500 MG capsule Take 2,000 mg by mouth as needed. Prior to dental procedure  . aspirin 81 MG EC tablet TAKE 1 TABLET (81 MG TOTAL) BY MOUTH DAILY. SWALLOW WHOLE.  Marland Kitchen atorvastatin (LIPITOR) 80 MG tablet Take 1 tablet (80 mg total) by mouth at bedtime.  . clopidogrel (PLAVIX) 75 MG tablet Take 1 tablet (75 mg total) by mouth daily with breakfast.  . diclofenac Sodium (VOLTAREN) 1 % GEL Apply 4 g topically 4 (four) times daily as needed (Pain). Apply to bilateral hips and knees  . doxycycline (VIBRA-TABS) 100 MG tablet Take 100 mg by mouth 2 (two) times daily.  . furosemide (LASIX) 80 MG tablet Take 80 mg by mouth 2 (two) times daily.  Marland Kitchen gabapentin (NEURONTIN) 100 MG capsule Take 1 capsule (100 mg total) by mouth at bedtime.  . hydrALAZINE (APRESOLINE) 50 MG tablet Take 1 tablet (50 mg total) by mouth 3 (three) times daily.  . Insulin Degludec-Liraglutide (XULTOPHY) 100-3.6 UNIT-MG/ML SOPN Inject 14 Units into the skin daily.  Marland Kitchen lactulose (CHRONULAC) 10 GM/15ML solution Take 20 g by mouth 2 (two) times daily.   Marland Kitchen  metoprolol succinate (TOPROL-XL) 25 MG 24 hr tablet Take 1 tablet (25 mg total) by mouth daily.  Marland Kitchen oxyCODONE-acetaminophen (PERCOCET) 5-325 MG tablet Take 1 tablet by mouth every 4 (four) hours as needed for severe pain.  Marland Kitchen PARoxetine (PAXIL) 30 MG tablet TAKE 1 TABLET BY MOUTH DAILY  . potassium chloride SA (KLOR-CON) 20 MEQ tablet Take 20 mEq by mouth daily.   . [DISCONTINUED] white petrolatum (VASELINE) GEL Apply 1 application topically at bedtime. Bilateral heels   No facility-administered encounter medications on file as of 01/06/2020.    Review of Systems  GENERAL: No change in appetite, no fatigue, no weight changes, no fever, chills or weakness MOUTH and THROAT: Denies oral discomfort, gingival pain or bleeding RESPIRATORY: no cough, SOB, DOE, wheezing, hemoptysis CARDIAC: No chest pain, edema or palpitations GI: No abdominal pain, diarrhea, constipation, heart burn,  nausea or vomiting GU: Denies dysuria, frequency, hematuria, incontinence, or discharge NEUROLOGICAL: Denies dizziness, syncope, numbness, or headache PSYCHIATRIC: Denies feelings of depression or anxiety. No report of hallucinations, insomnia, paranoia, or agitation   Immunization History  Administered Date(s) Administered  . Fluad Quad(high Dose 65+) 02/11/2019  . Influenza Split 11/12/2010, 11/10/2011  . Influenza Whole 11/07/2009  . Influenza,inj,Quad PF,6+ Mos 10/19/2012, 01/05/2014, 11/08/2015, 01/29/2017  . Influenza-Unspecified 12/15/2019  . Moderna SARS-COVID-2 Vaccination 03/09/2019, 04/06/2019  . Pneumococcal Conjugate-13 06/07/2014  . Pneumococcal Polysaccharide-23 12/26/1994, 03/05/2010  . Td 09/24/1993  . Tdap 11/12/2010, 07/12/2013   Pertinent  Health Maintenance Due  Topic Date Due  . FOOT EXAM  02/24/2020 (Originally 07/10/2017)  . OPHTHALMOLOGY EXAM  02/24/2020 (Originally 11/07/2016)  . LIPID PANEL  02/07/2020  . HEMOGLOBIN A1C  04/05/2020  . INFLUENZA VACCINE  Completed  . DEXA SCAN  Completed  . PNA vac Low Risk Adult  Completed   Fall Risk  08/02/2019 06/16/2019 05/19/2019 05/04/2019 04/27/2019  Falls in the past year? $RemoveBe'1 1 1 'BPiRleKie$ 0 0  Comment - pt denies-but dtr stated pt had a fall in last mths pt denies falls-but dtr states pt has had a fall - -  Number falls in past yr: 1 0 0 - 0  Injury with Fall? 0 0 0 - 0  Risk Factor Category  - - - - -  Risk for fall due to : Impaired mobility History of fall(s);Impaired balance/gait;Impaired mobility;Medication side effect History of fall(s);Impaired balance/gait;Impaired mobility;Medication side effect No Fall Risks -  Risk for fall due to: Comment - - - - -  Follow up Falls evaluation completed Falls evaluation completed;Falls prevention discussed Falls evaluation completed Falls evaluation completed -     Vitals:   01/06/20 1449  BP: 106/68  Pulse: 67  Resp: 18  Temp: (!) 97.5 F (36.4 C)  TempSrc: Oral  Weight:  159 lb 9.6 oz (72.4 kg)  Height: $Remove'5\' 5"'UqyWchS$  (1.651 m)   Body mass index is 26.56 kg/m.  Physical Exam  GENERAL APPEARANCE: Well nourished. In no acute distress. Normal body habitus SKIN:  Right heel with black eschar in the middle and pink skin on peripheral area. Left groin is dry. MOUTH and THROAT: Lips are without lesions. Oral mucosa is moist and without lesions. Tongue is normal in shape, size, and color and without lesions RESPIRATORY: Breathing is even & unlabored, BS CTAB CARDIAC: RRR, no murmur,no extra heart sounds, no edema GI: Abdomen soft, normal BS, no masses, no tenderness EXTREMITIES:  Able to move X 4 extremities NEUROLOGICAL: There is no tremor. Speech is clear. Alert and oriented X 3. PSYCHIATRIC:  Affect and behavior are appropriate  Labs reviewed: Recent Labs    02/07/19 1323 02/07/19 1527 02/08/19 0556 02/09/19 0508 03/02/19 0253 05/04/19 1144 07/20/19 0301 07/21/19 0356 08/11/19 1638 08/16/19 0000 08/23/19 1701 08/23/19 1701 09/20/19 0000 09/20/19 0000 11/25/19 0000 01/04/20 1129 01/04/20 1222  NA 143   < > 145   < > 140   < > 137   < > 139   < > 143   < > 138  --  142  --  139  K 3.3*   < > 4.1   < > 3.7   < > 3.6   < > 4.9   < > 4.4   < > 4.2   < > 4.4 4.5 4.5  CL 110   < > 109   < > 105   < > 98   < > 98   < > 102   < > 101  --  107  --  107  CO2 23   < > 27   < > 26   < > 27   < > 27   < > 25  --  23*  --  26*  --   --   GLUCOSE 203*   < > 143*   < > 115*   < > 239*   < > 171*  --  121*  --   --   --   --   --  109*  BUN 25*   < > 26*   < > 24*   < > 26*   < > 45*   < > 44*   < > 65*  --  27*  --  56*  CREATININE 1.48*   < > 1.58*   < > 1.47*   < > 1.56*   < > 1.70*   < > 1.62*  --  1.8*  --  1.6*  --  2.80*  CALCIUM 8.2*   < > 9.0   < > 8.8*   < > 8.6*   < > 9.0   < > 9.4  --  8.6*  --  8.6*  --   --   MG 1.7   < > 2.3  --  1.7  --  1.8  --   --   --   --   --   --   --   --   --   --   PHOS 3.6  --   --   --   --   --   --   --   --   --   --    --   --   --   --   --   --    < > = values in this interval not displayed.   Recent Labs    02/07/19 1323 02/07/19 1323 02/24/19 1020 02/24/19 1020 07/17/19 2150 11/25/19 0000 12/09/19 0000  AST 13*   < > 16  --  17 14  --   ALT 12   < > 13  --  13 15  --   ALKPHOS 96   < > 125   < > 141* 140* 149*  BILITOT 1.0  --  0.6  --  0.8  --   --   PROT 6.5  --  7.8  --  7.6  --   --   ALBUMIN 3.1*   < > 3.4*  --  3.4*  2.9*  --    < > = values in this interval not displayed.   Recent Labs    07/17/19 2150 07/17/19 2150 07/19/19 0219 07/19/19 0219 07/20/19 0723 08/02/19 0000 08/08/19 0000 08/08/19 0000 09/20/19 0000 11/25/19 0000 01/04/20 1222  WBC 18.9*   < > 16.1*   < > 9.2   < > 7.7  --  8.0 6.9  --   NEUTROABS 17.3*  --   --   --   --    < > 5  --  5 4  --   HGB 12.8   < > 10.3*   < > 10.3*   < > 10.9*   < > 11.0* 10.3* 12.6  HCT 40.0   < > 32.2*   < > 32.4*   < > 33*   < > 34* 31* 37.0  MCV 94.8  --  93.9  --  93.9  --   --   --   --   --   --   PLT 256   < > 184   < > 193   < > 299  --  246 254  --    < > = values in this interval not displayed.   Lab Results  Component Value Date   TSH 1.630 02/07/2019   Lab Results  Component Value Date   HGBA1C 6.3 (H) 01/04/2020   Lab Results  Component Value Date   CHOL 99 02/07/2019   HDL 26 (L) 02/07/2019   LDLCALC 50 02/07/2019   TRIG 117 02/07/2019   CHOLHDL 3.8 02/07/2019    Significant Diagnostic Results in last 30 days:  PERIPHERAL VASCULAR CATHETERIZATION  Result Date: 01/04/2020 DATE OF SERVICE: 01/04/2020 PATIENT:  Thurnell Lose  74 y.o. female PRE-OPERATIVE DIAGNOSIS:  ischemia POST-OPERATIVE DIAGNOSIS:  * No post-op diagnosis entered * PROCEDURE:  1) US guided LCFA access 2) Aortogram 3) left lower extremity third order cannulation and angiogram ( 4) angioplasty of right anterior tibial artery (3x24mm Sterling x SURGEON:  Surgeon(s) and Role:    * Cherre Robins, MD - Primary ASSISTANT: none  ANESTHESIA:   local and IV sedation EBL: min BLOOD ADMINISTERED:none DRAINS: none LOCAL MEDICATIONS USED:  LIDOCAINE SPECIMEN:  none DISPOSITION OF SPECIMEN:  n/a COUNTS: confirmed correct. TOURNIQUET:  * No tourniquets in log * PLAN OF CARE: Admit for overnight observation PATIENT DISPOSITION:  PACU - hemodynamically stable.  Delay start of Pharmacological VTE agent (>24hrs) due to surgical blood loss or risk of bleeding: no INDICATION FOR PROCEDURE: ENDA SANTO is a 74 y.o. female with critical limb ischemia with tissue loss of right lower extremity. After careful discussion of risks, benefits, and alternatives the patient was offered. The patient understood and wished to proceed. DESCRIPTION OF PROCEDURE: After identification of the patient in the pre-operative holding area, the patient was transferred to the operating room. The patient was positioned supine on the operating room table. Anesthesia was induced. The groins were prepped and draped in standard fashion. A surgical pause was performed confirming correct patient, procedure, and operative location. To begin the left groin was anesthetized with 1% lidocaine. Using ultrasound guidance, the left common femoral artery was accessed with micropuncture technique. Fluoroscopy was used to confirm cannulation over the femoral head. Sheathogram was not performed. The 49F sheath was upsized to 62F. An 035 Britta Mccreedy wire was advanced into the distal aorta. Over the wire an omni flush catheter was advanced to the level of L2. Aortogram  was performed with CO2 revealing no hemodynamically significant stenosis. The right common iliac artery was selected with an 035 floppy angled glidewire. The wire was advanced into the superficial femoral artery. Over the wire the omni flush catheter was advanced into the external iliac artery. Selective angiography was performed revealing: Focal stenosis of the common femoral artery >75% No hemodynamically significant stenosis in SFA,  popliteal artery All tibial arteries occluded at their origin Reconstitution of the anterior tibial and peroneal artery at the ankle. The decision was made to intervene. The patient was heparinized with 8000 units of heparin. The sheath was exchanged for a 98F x 45cm sheath. Selective angiography of the left lower extremity was performed prior to intervention. An 018 V18 wire was used to select the anterior tibial artery and the chronic total occlusion was crossed endoluminally with a crossing catheter. Selective angiography through the catheter confirmed endoluminal position. The lesions were treated with: Angioplasty using a 3x23mm Sterling balloon with two separate treatments. Completion angiography revealed: Recanalization of the anterior tibial artery with inline flow to the foot The sheath was exchanged for a short 98F sheath. A mynx device was used to close the arteriotomy. This maldeployed. Manual pressure was held. Hemostasis was excellent upon completion. Upon completion of the case instrument and sharps counts were confirmed correct. The patient was transferred to the PACU in good condition. I was present for all portions of the procedure. [name]. Stanford Breed, MD [facility] Phone Number: [phone] 01/04/2020 4:28 PM    Assessment/Plan  1. Critical lower limb ischemia (HCC) -  S/P right lower extremity angiography and right peripheral vascular balloon angioplasty -Continue aspirin and Plavix indefinitely -Follow-up with vascular in 4 weeks with repeat ABI/duplex  2. Neuropathy -Continue gabapentin  3. Type 2 diabetes mellitus with stage 3b chronic kidney disease, with long-term current use of insulin (HCC) Lab Results  Component Value Date   HGBA1C 6.3 (H) 01/04/2020   -Continue Xultophy and CBG checks  4. Wound infection -Continue doxycycline and wound treatment daily  5. History of CVA (cerebrovascular accident) -Stable, continue Plavix,  aspirin and aspirin    Family/ staff Communication: Discussed plan of care with resident and charge nurse.  Labs/tests ordered: None  Goals of care:   Long-term care   Durenda Age, DNP, MSN, FNP-BC [facility] [phone] (Monday-Friday 8:00 a.m. - 5:00 p.m.) [phone] (after hours)

## 2020-01-10 ENCOUNTER — Non-Acute Institutional Stay (INDEPENDENT_AMBULATORY_CARE_PROVIDER_SITE_OTHER): Payer: Medicare Other | Admitting: Adult Health

## 2020-01-10 ENCOUNTER — Other Ambulatory Visit: Payer: Self-pay | Admitting: Adult Health

## 2020-01-10 ENCOUNTER — Encounter: Payer: Self-pay | Admitting: Adult Health

## 2020-01-10 DIAGNOSIS — Z0000 Encounter for general adult medical examination without abnormal findings: Secondary | ICD-10-CM

## 2020-01-10 MED ORDER — OXYCODONE-ACETAMINOPHEN 5-325 MG PO TABS: 1.0000 | ORAL_TABLET | ORAL | 0 refills | Status: DC | PRN
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
## 2020-01-12 ENCOUNTER — Ambulatory Visit (INDEPENDENT_AMBULATORY_CARE_PROVIDER_SITE_OTHER): Payer: Medicare Other | Admitting: Sports Medicine

## 2020-01-12 ENCOUNTER — Encounter: Payer: Self-pay | Admitting: Sports Medicine

## 2020-01-12 ENCOUNTER — Other Ambulatory Visit: Payer: Self-pay

## 2020-01-12 VITALS — Temp 97.6°F

## 2020-01-12 DIAGNOSIS — E0800 Diabetes mellitus due to underlying condition with hyperosmolarity without nonketotic hyperglycemic-hyperosmolar coma (NKHHC): Secondary | ICD-10-CM

## 2020-01-12 DIAGNOSIS — L89610 Pressure ulcer of right heel, unstageable: Secondary | ICD-10-CM

## 2020-01-12 DIAGNOSIS — I7025 Atherosclerosis of native arteries of other extremities with ulceration: Secondary | ICD-10-CM

## 2020-01-12 DIAGNOSIS — I739 Peripheral vascular disease, unspecified: Secondary | ICD-10-CM

## 2020-01-12 DIAGNOSIS — M869 Osteomyelitis, unspecified: Secondary | ICD-10-CM

## 2020-01-12 DIAGNOSIS — L97511 Non-pressure chronic ulcer of other part of right foot limited to breakdown of skin: Secondary | ICD-10-CM | POA: Diagnosis not present

## 2020-01-12 NOTE — Progress Notes (Signed)
Subjective: [name] is a 74 y.o. female patient seen in office for evaluation of ulceration of the right great toe and heel ulcer.  Patient denies any foot pain but does admit to some pain in her groin and some drainage from this area.  Patient denies nausea vomiting fever chills or any other constitutional symptoms at this time.  Patient Active Problem List   Diagnosis Date Noted  . Critical lower limb ischemia ([place]) 01/04/2020  . Skin pustule 10/20/2019  . Neurocognitive deficits 08/04/2019  . Controlled type 2 diabetes mellitus without complication ([place]) 08/17/7626  . CVA (cerebral vascular accident) ([place]) 07/23/2019  . Infection of pacemaker lead wire ([place]) 07/21/2019  . Fever   . History of transcatheter aortic valve replacement (TAVR)   . Septic encephalopathy 07/18/2019  . Sacral decubitus ulcer, stage II ([place]) 07/18/2019  . Bacteremia due to group B Streptococcus 07/18/2019  . Sepsis ([place]) 07/18/2019  . Complete heart block ([place])   . Chronic diastolic congestive heart failure ([place]) 02/07/2019  . AKI (acute kidney injury) ([place]) 07/22/2017  . Peripheral arterial disease ([place]) 06/18/2017  . B12 deficiency 05/01/2017  . Bilateral lower extremity edema 11/08/2015  . Aortic atherosclerosis ([place]) 06/07/2014  . Abnormality of gait 05/29/2012  . Constipation 03/16/2012  . HOCM (hypertrophic obstructive cardiomyopathy) ([place]) 06/11/2011  . Routine health maintenance 06/10/2010  . Hyperlipidemia   . Refusal of blood transfusions as patient is Jehovah's Witness   . History of adenocarcinoma of breast   . Osteoporosis   . Status post CVA   . Aortic stenosis s/p Tissure AVR 2006   . CAD (coronary artery disease)   . Hypertension   . Depression 01/23/2006  . DM (diabetes mellitus) ([place]) 03/25/1990  . Benign hypertensive heart and kidney disease with diastolic CHF, NYHA class 3 and CKD stage 3 ([place]) 1992  . Bradycardia 1992   Current Outpatient Medications on File Prior to  Visit  Medication Sig Dispense Refill  . acetaminophen (TYLENOL) 325 MG tablet Take 650 mg by mouth every 6 (six) hours as needed. Notify MD id not relieved (Not to exceed 3000 mg ) in 24 hour per stranding order    . acetaminophen (TYLENOL) 325 MG tablet Take 650 mg by mouth in the morning.    Marland Kitchen amLODipine (NORVASC) 5 MG tablet Take 1 tablet (5 mg total) by mouth daily. 90 tablet 3  . amoxicillin (AMOXIL) 500 MG capsule Take 2,000 mg by mouth as needed. Prior to dental procedure    . aspirin 81 MG EC tablet TAKE 1 TABLET (81 MG TOTAL) BY MOUTH DAILY. SWALLOW WHOLE. 30 tablet 3  . atorvastatin (LIPITOR) 80 MG tablet Take 1 tablet (80 mg total) by mouth at bedtime. 90 tablet 3  . clopidogrel (PLAVIX) 75 MG tablet Take 1 tablet (75 mg total) by mouth daily with breakfast. 90 tablet 3  . diclofenac Sodium (VOLTAREN) 1 % GEL Apply 4 g topically 4 (four) times daily as needed (Pain). Apply to bilateral hips and knees    . doxycycline (VIBRA-TABS) 100 MG tablet Take 100 mg by mouth 2 (two) times daily.    . furosemide (LASIX) 80 MG tablet Take 80 mg by mouth 2 (two) times daily.    Marland Kitchen gabapentin (NEURONTIN) 100 MG capsule Take 1 capsule (100 mg total) by mouth at bedtime. 90 capsule 1  . hydrALAZINE (APRESOLINE) 50 MG tablet Take 1 tablet (50 mg total) by mouth 3 (three) times daily. 270 tablet 0  . Insulin  Degludec-Liraglutide (XULTOPHY) 100-3.6 UNIT-MG/ML SOPN Inject 14 Units into the skin daily.    Marland Kitchen lactulose (CHRONULAC) 10 GM/15ML solution Take 20 g by mouth 2 (two) times daily.     . metoprolol succinate (TOPROL-XL) 25 MG 24 hr tablet Take 1 tablet (25 mg total) by mouth daily. 90 tablet 1  . oxyCODONE-acetaminophen (PERCOCET) 5-325 MG tablet Take 1 tablet by mouth every 4 (four) hours as needed for severe pain. 30 tablet 0  . PARoxetine (PAXIL) 30 MG tablet TAKE 1 TABLET BY MOUTH DAILY 30 tablet 11  . potassium chloride SA (KLOR-CON) 20 MEQ tablet Take 20 mEq by mouth daily.      No current  facility-administered medications on file prior to visit.   Allergies  Allergen Reactions  . 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," as the patient is a Sales promotion account executive Witness    Recent Results (from the past 2160 hour(s))  HM HEPATITIS C SCREENING LAB     Status: None   Collection Time: 10/17/19 12:00 AM  Result Value Ref Range   HM Hepatitis Screen Negative-Validated     Comment: 0.08  HM HEPATITIS C SCREENING LAB     Status: None   Collection Time: 10/17/19 12:00 AM  Result Value Ref Range   HM Hepatitis Screen Negative-Validated     Comment: 0.08  WOUND CULTURE     Status: Abnormal   Collection Time: 11/24/19  9:03 AM   Specimen: Wound  Result Value Ref Range   MICRO NUMBER: [id number]    SPECIMEN QUALITY: Adequate    SOURCE: RIGHT HALLUX    STATUS: FINAL    GRAM STAIN:      Moderate White blood cells seen No epithelial cells seen Many Gram positive cocci in pairs Many Gram negative bacilli   ISOLATE 1: Morganella morganii (A)     Comment: Heavy growth of Morganella morganii   ISOLATE 2: Enterobacter cloacae (A)     Comment: Heavy growth of Enterobacter cloacae      Susceptibility   Enterobacter cloacae - AEROBIC CULT, GRAM STAIN NEGATIVE 2    AMOX/CLAVULANIC 8 Resistant     CEFAZOLIN >=64 Resistant     CEFEPIME <=1 Sensitive     CEFTRIAXONE 8 Resistant     CIPROFLOXACIN <=0.25 Sensitive     LEVOFLOXACIN 1 Intermediate     ERTAPENEM <=0.5 Sensitive     GENTAMICIN >=16 Resistant     IMIPENEM <=0.25 Sensitive     PIP/TAZO <=4 Sensitive     TOBRAMYCIN 8 Intermediate     TRIMETH/SULFA* >=320 Resistant      * For infections other than uncomplicated UTIcaused by E. coli, K. pneumoniae or P. mirabilis:Cefazolin is resistant if MIC > or = 8 mcg/mL.(Distinguishing susceptible versus intermediatefor isolates with MIC < or = 4 mcg/mL requiresadditional testing.)Legend:S = Susceptible  I = IntermediateR = Resistant  NS = Not susceptible* = Not tested  NR = Not reported**NN  = See antimicrobic comments   Morganella morganii - AEROBIC CULT, GRAM STAIN NEGATIVE 1    AMOX/CLAVULANIC >=32 Resistant     AMPICILLIN >=32 Resistant     AMPICILLIN/SULBACTAM >=32 Resistant     CEFAZOLIN* >=64 Resistant      * For infections other than uncomplicated UTIcaused by E. coli, K. pneumoniae or P. mirabilis:Cefazolin is resistant if MIC > or = 8 mcg/mL.(Distinguishing susceptible versus intermediatefor isolates with MIC < or = 4 mcg/mL requiresadditional testing.)    CEFEPIME <=1 Sensitive  CEFTRIAXONE <=1 Sensitive     CIPROFLOXACIN >=4 Resistant     LEVOFLOXACIN >=8 Resistant     ERTAPENEM <=0.5 Sensitive     GENTAMICIN <=1 Sensitive     PIP/TAZO <=4 Sensitive     TOBRAMYCIN <=1 Sensitive     TRIMETH/SULFA >=320 Resistant   CBC and differential     Status: Abnormal   Collection Time: 11/25/19 12:00 AM  Result Value Ref Range   Hemoglobin 10.3 (A) 12.0 - 16.0   HCT 31 (A) 36 - 46   Neutrophils Absolute 4    Platelets 254 150 - 399   WBC 6.9   CBC     Status: Abnormal   Collection Time: 11/25/19 12:00 AM  Result Value Ref Range   RBC 3.49 (A) 3.87 - 5.69  Basic metabolic panel     Status: Abnormal   Collection Time: 11/25/19 12:00 AM  Result Value Ref Range   Glucose 86    BUN 27 (A) 4 - 21   CO2 26 (A) 13 - 22   Creatinine 1.6 (A) 0.5 - 1.1   Potassium 4.4 3.4 - 5.3   Sodium 142 137 - 147   Chloride 107 99 - 108  Comprehensive metabolic panel     Status: Abnormal   Collection Time: 11/25/19 12:00 AM  Result Value Ref Range   Globulin 2.8    GFR calc Af Amer 34.88    GFR calc non Af Amer 30.09    Calcium 8.6 (A) 8.7 - 10.7   Albumin 2.9 (A) 3.5 - 5.0  Hepatic function panel     Status: Abnormal   Collection Time: 11/25/19 12:00 AM  Result Value Ref Range   Alkaline Phosphatase 140 (A) 25 - 125   ALT 15 7 - 35   AST 14 13 - 35   Bilirubin, Total 0.2   Hepatic function panel     Status: Abnormal   Collection Time: 12/09/19 12:00 AM  Result Value Ref  Range   Alkaline Phosphatase 149 (A) 25 - 125  Glucose, capillary     Status: None   Collection Time: 01/04/20  9:50 AM  Result Value Ref Range   Glucose-Capillary 94 70 - 99 mg/dL    Comment: Glucose reference range applies only to samples taken after fasting for at least 8 hours.   Comment 1 Notify RN   SARS Coronavirus 2 by RT PCR (hospital order, performed in [facility] hospital lab) Nasopharyngeal Nasopharyngeal Swab     Status: None   Collection Time: 01/04/20  9:53 AM   Specimen: Nasopharyngeal Swab  Result Value Ref Range   SARS Coronavirus 2 NEGATIVE NEGATIVE    Comment: (NOTE) SARS-CoV-2 target nucleic acids are NOT DETECTED.  The SARS-CoV-2 RNA is generally detectable in upper and lower respiratory specimens during the acute phase of infection. The lowest concentration of SARS-CoV-2 viral copies this assay can detect is 250 copies / mL. A negative result does not preclude SARS-CoV-2 infection and should not be used as the sole basis for treatment or other patient management decisions.  A negative result may occur with improper specimen collection / handling, submission of specimen other than nasopharyngeal swab, presence of viral mutation(s) within the areas targeted by this assay, and inadequate number of viral copies (<250 copies / mL). A negative result must be combined with clinical observations, patient history, and epidemiological information.  Fact Sheet for Patients:   StrictlyIdeas.no  Fact Sheet for Healthcare Providers: BankingDealers.co.za  This test  is not yet approved or  cleared by the [place] and has been authorized for detection and/or diagnosis of SARS-CoV-2 by FDA under an Emergency Use Authorization (EUA).  This EUA will remain in effect (meaning this test can be used) for the duration of the COVID-19 declaration under Section 564(b)(1) of the Act, 21 U.S.C. section 360bbb-3(b)(1), unless  the authorization is terminated or revoked sooner.  Performed at [facility], [place] [address], [place], [place] [address]   Potassium     Status: None   Collection Time: 01/04/20 11:29 AM  Result Value Ref Range   Potassium 4.5 3.5 - 5.1 mmol/L    Comment: Performed at [facility] [address] , [place] [address]  I-STAT, [place] [address]     Status: Abnormal   Collection Time: 01/04/20 12:22 PM  Result Value Ref Range   Sodium 139 135 - 145 mmol/L   Potassium 4.5 3.5 - 5.1 mmol/L   Chloride 107 98 - 111 mmol/L   BUN 56 (H) 8 - 23 mg/dL   Creatinine, Ser 2.80 (H) 0.44 - 1.00 mg/dL   Glucose, Bld 109 (H) 70 - 99 mg/dL    Comment: Glucose reference range applies only to samples taken after fasting for at least 8 hours.   Calcium, Ion 1.08 (L) 1.15 - 1.40 mmol/L   TCO2 24 22 - 32 mmol/L   Hemoglobin 12.6 12.0 - 15.0 g/dL   HCT 37.0 36 - 46 %  POCT Activated clotting time     Status: None   Collection Time: 01/04/20  4:12 PM  Result Value Ref Range   Activated Clotting Time 274 seconds  Glucose, capillary     Status: Abnormal   Collection Time: 01/04/20  5:07 PM  Result Value Ref Range   Glucose-Capillary 106 (H) 70 - 99 mg/dL    Comment: Glucose reference range applies only to samples taken after fasting for at least 8 hours.  Hemoglobin A1c     Status: Abnormal   Collection Time: 01/04/20  7:43 PM  Result Value Ref Range   Hgb A1c MFr Bld 6.3 (H) 4.8 - 5.6 %    Comment: (NOTE) Pre diabetes:          5.7%-6.4%  Diabetes:              >6.4%  Glycemic control for   <7.0% adults with diabetes    Mean Plasma Glucose 134.11 mg/dL    Comment: Performed at [facility] [address], [place], [place] [address]  Glucose, capillary     Status: Abnormal   Collection Time: 01/04/20 11:36 PM  Result Value Ref Range   Glucose-Capillary 197 (H) 70 - 99 mg/dL    Comment: Glucose reference range applies only to samples taken after fasting for at least 8 hours.   Glucose, capillary     Status: Abnormal   Collection Time: 01/05/20  5:48 AM  Result Value Ref Range   Glucose-Capillary 103 (H) 70 - 99 mg/dL    Comment: Glucose reference range applies only to samples taken after fasting for at least 8 hours.  Glucose, capillary     Status: Abnormal   Collection Time: 01/05/20 11:01 AM  Result Value Ref Range   Glucose-Capillary 158 (H) 70 - 99 mg/dL    Comment: Glucose reference range applies only to samples taken after fasting for at least 8 hours.    Objective: There were no vitals filed for this  visit.  General: Patient is awake, alert, oriented x 3 and in no acute distress.  Dermatology: Skin is warm and dry bilateral with a full thickness ulceration present distal tuft of the right great toe  Ulceration measures 1 cm x 1 cm x 0.3 cm. There is a  Keratotic border with a now dry eschar base.  Does not probe to bone anymore.  There is no malodor, bloody active drainage, no erythema, localized edema. No other acute signs of infection.  To the right heel there is a dry eschar noted that measures 6 x 4 cm with eschar base no active drainage no bogginess there is mild edema but no malodor and localized edema present.   Vascular: Dorsalis Pedis pulse = 0/4 Bilateral,  Posterior Tibial pulse = 0/4 Bilateral,  Capillary Fill Time < 5 seconds  Neurologic: Protective sensation severely diminished with significant neuropathy pain worse at night.  Musculosketal: There is pain with palpation to ulcerated areas on the right.  Severe end-stage bunion deformity.  No pain with compression to calves bilateral.   Recent Labs    07/17/19 2150  LABORGA GROUP B STREP(S.AGALACTIAE)ISOLATED    Assessment and Plan:  Problem List Items Addressed This Visit      Endocrine   DM (diabetes mellitus) ([place])    Other Visit Diagnoses    Decubitus ulcer of heel, right, unstageable ([place])    -  Primary   Skin ulcer of right great toe, limited to breakdown of skin  ([place])       Osteomyelitis of great toe of right foot ([place])       PAD (peripheral artery disease) ([place])         -Examined patient and discussed the progression of the wounds and treatment alternatives. -Cleansed ulceration using a saline moistened gauze debrided any loose tissue to the area at the right great toe and heel but did advise patient due to her circulation we cannot do any type of aggressive debridement because she may not be able to heal it.  Hemostasis was achieved with manuel pressure. Patient tolerated nonselective debridement procedure well without any discomfort or anesthesia necessary for this wound debridement.  -Applied Betadine and dry sterile dressing and wrote orders for the facility to continue with the same -Continue with Doxycyline to prevent superimposed infection right foot ulcers -Continue with surgical shoe and limited activity due to heel and toe ulcer on right; must continue with strict pillow offloading of heel to prevent worsening of heel ulcer when in bed -Continue with vascular follow-up - Advised patient to go to the ER or return to office if the wound worsens or if constitutional symptoms are present. -Recommend for the facility to check her groin vascular procedure site to ensure that it is not getting infected -Patient to return to office in 2-3 weeks for wound care or sooner if problems arise.  Landis Martins, DPM

## 2020-01-13 LAB — CBC AND DIFFERENTIAL
HCT: 42 (ref 36–46)
Hemoglobin: 13.6 (ref 12.0–16.0)
Neutrophils Absolute: 8.4
Platelets: 246 (ref 150–399)
WBC: 12

## 2020-01-13 LAB — BASIC METABOLIC PANEL
BUN: 80 — AB (ref 4–21)
CO2: 23 — AB (ref 13–22)
Chloride: 95 — AB (ref 99–108)
Creatinine: 2.4 — AB (ref 0.5–1.1)
Glucose: 129
Potassium: 4.6 (ref 3.4–5.3)
Sodium: 134 — AB (ref 137–147)

## 2020-01-13 LAB — COMPREHENSIVE METABOLIC PANEL
Calcium: 9.1 (ref 8.7–10.7)
GFR calc Af Amer: 21.99
GFR calc non Af Amer: 18.97

## 2020-01-13 LAB — CBC: RBC: 4.59 (ref 3.87–5.11)

## 2020-01-17 LAB — BASIC METABOLIC PANEL
BUN: 80 — AB (ref 4–21)
CO2: 23 — AB (ref 13–22)
Chloride: 103 (ref 99–108)
Creatinine: 2 — AB (ref 0.5–1.1)
Glucose: 102
Potassium: 4.3 (ref 3.4–5.3)
Sodium: 137 (ref 137–147)

## 2020-01-17 LAB — COMPREHENSIVE METABOLIC PANEL
Calcium: 8.7 (ref 8.7–10.7)
GFR calc Af Amer: 27.91
GFR calc non Af Amer: 24.08

## 2020-01-17 LAB — CBC AND DIFFERENTIAL
HCT: 36 (ref 36–46)
Hemoglobin: 11.7 — AB (ref 12.0–16.0)
Neutrophils Absolute: 5.1
Platelets: 212 (ref 150–399)
WBC: 8.7

## 2020-01-17 LAB — CBC: RBC: 3.95 (ref 3.87–5.11)

## 2020-01-18 ENCOUNTER — Non-Acute Institutional Stay (SKILLED_NURSING_FACILITY): Payer: Medicare Other | Admitting: Adult Health

## 2020-01-18 ENCOUNTER — Encounter: Payer: Self-pay | Admitting: Adult Health

## 2020-01-18 DIAGNOSIS — T148XXA Other injury of unspecified body region, initial encounter: Secondary | ICD-10-CM

## 2020-01-18 DIAGNOSIS — L089 Local infection of the skin and subcutaneous tissue, unspecified: Secondary | ICD-10-CM

## 2020-01-18 DIAGNOSIS — I70229 Atherosclerosis of native arteries of extremities with rest pain, unspecified extremity: Secondary | ICD-10-CM

## 2020-01-18 DIAGNOSIS — E1122 Type 2 diabetes mellitus with diabetic chronic kidney disease: Secondary | ICD-10-CM

## 2020-01-18 DIAGNOSIS — Z7189 Other specified counseling: Secondary | ICD-10-CM

## 2020-01-18 DIAGNOSIS — G629 Polyneuropathy, unspecified: Secondary | ICD-10-CM

## 2020-01-18 DIAGNOSIS — Z794 Long term (current) use of insulin: Secondary | ICD-10-CM

## 2020-01-18 DIAGNOSIS — R531 Weakness: Secondary | ICD-10-CM | POA: Diagnosis not present

## 2020-01-18 DIAGNOSIS — N1832 Chronic kidney disease, stage 3b: Secondary | ICD-10-CM

## 2020-01-18 LAB — GLUCOSE, CAPILLARY: Glucose-Capillary: 117 mg/dL — ABNORMAL HIGH (ref 70–99)

## 2020-01-18 NOTE — Progress Notes (Signed)
Location:  [facility] Room Number: [id number] Place of Service:  SNF (31) Provider:  Kenard Gower, DNP, FNP-BC  Patient Care Team: Pecola Lawless, MD as PCP - General (Internal Medicine) Lars Masson, MD as PCP - Cardiology (Cardiology) Regan Lemming, MD as PCP - Electrophysiology (Cardiology) Medina-Vargas, Margit Banda, NP as Nurse Practitioner (Internal Medicine) Rehab, [facility]  Extended Emergency Contact Information Primary Emergency Contact: Malen Gauze [place] Home Phone: [phone] Mobile Phone: [phone] Relation: Daughter Secondary Emergency Contact: Armstrong,[name]  [place] Home Phone: [phone] Relation: Daughter  Code Status:  Full Code  Goals of care: Advanced Directive information Advanced Directives 01/04/2020  Does Patient Have a Medical Advance Directive? No  Type of Advance Directive -  Does patient want to make changes to medical advance directive? -  Copy of Healthcare Power of Attorney in Chart? -  Would patient like information on creating a medical advance directive? No - Patient declined  Pre-existing out of facility DNR order (yellow form or pink MOST form) -     Chief Complaint  Patient presents with  . Care plan meeting    Change of condition    HPI:  Pt is a 74 y.o. female seen today for a change of condition. Resident is a long-term care resident of [facility]. He has a PMH of chronic kidney disease, CAD, severe aortic stenosis, history of breast cancer, essential hypertension, dyslipidemia, stroke and chronic diastolic congestive heart failure. Care plan meeting was attended by resident, MDS coordinator, treatment nurse, NP, social worker and Gerber, daughter, who attended via teleconference. Resident remains to be full code. Resident is now needing hoyer lift. She is now not able to ambulate nor  transfer herself from bed to wheelchair and vice versa. Discussed medications, vital signs and weights. She is currently on Doxycycline for right heel wound infection. Right great toe wound is dry and no erythema. She denies having pain on her right foot nor the left. Percocet was discontinued due to being sleepy. She takes Gabapentin  100 mg at bedtime for neuropathy  The meeting lasted for 30 minutes.  Past Medical History:  Diagnosis Date  . Acute on chronic diastolic CHF (congestive heart failure) (HCC) 06/22/2017  . Acute on chronic renal failure (HCC) 05/25/2011  . Adenocarcinoma of breast (HCC) 1996   Completed tamoxifen and had mastectomy.  . Aortic stenosis, severe    s/p aortic valve replacement with porcine valve 06/2004.  ECHO 2010 EF 65%, LVH, diastolic dysfxn, Bioprostetic aoritc valve, mild AS. ECHO 2013 EF 60%, Nl aortic artificial valve, dynamic obstruction in the outflow tract   Class IIb rec for annual TTE after 5 yrs. She had a TTE 2013    . Arthritis   . CAD (coronary artery disease) 2006   s/p CABG (5/06) w/ saphenous vein to RCA at time of AVR  . CKD (chronic kidney disease) stage 2, GFR 60-89 ml/min 06/03/2011   She is no longer taking NSAIDs.   Marland Kitchen CVA (cerebral infarction) 2006   Post-op from AVR. Presumed embolic in nature. Carotid stenosis of R 60-79%. Repeat dopplers 4/10 no R stenosis and L stenosis of 1-29%.  . Depression    Controlled on Paxil  . Diabetes mellitus 1992   Dx 04/25/1990. Now insulin dependent, started 2008. On ACEI.   . Diverticulosis 2001  . History of 2019 novel coronavirus disease (COVID-19)   . Hyperlipidemia  Mgmt with a statin  . Hypertension    Requires 4 drug tx  . Osteoporosis 2006   DEXA 10/06 : L femur T -2.8, R -2.7. Lumbar T -2.4. On bisphosphonates and  Calcium / Vit D.  . Peripheral vascular disease ([place])   . Presence of permanent cardiac pacemaker   . Refusal of blood transfusions as patient is Jehovah's Witness   . Stroke [facility])     Past Surgical History:  Procedure Laterality Date  . ABDOMINAL HYSTERECTOMY  1987   for fibroids  . AORTIC VALVE REPLACEMENT  2006  . CHOLECYSTECTOMY    . CORONARY ARTERY BYPASS GRAFT  2006   Saphenous vein to RCA at time of AVR. Course complicated by acute respiratory failure, post-op PTX, ARI, ileus, CVA  . LOWER EXTREMITY ANGIOGRAPHY N/A 01/04/2020   Procedure: LOWER EXTREMITY ANGIOGRAPHY - Right;  Surgeon: Cherre Robins, MD;  Location: [place] [facility];  Service: Cardiovascular;  Laterality: N/A;  . MASTECTOMY Left 1995   L for adenocarcinoma  . PACEMAKER IMPLANT N/A 02/09/2019   Procedure: PACEMAKER IMPLANT;  Surgeon: Constance Haw, MD;  Location: [place] [facility];  Service: Cardiovascular;  Laterality: N/A;  . PERIPHERAL VASCULAR BALLOON ANGIOPLASTY  01/04/2020   Procedure: PERIPHERAL VASCULAR BALLOON ANGIOPLASTY;  Surgeon: Cherre Robins, MD;  Location: [place] [facility];  Service: Cardiovascular;;  Rt AT  . RIGHT HEART CATH AND CORONARY/GRAFT ANGIOGRAPHY N/A 02/09/2019   Procedure: RIGHT HEART CATH AND CORONARY/GRAFT ANGIOGRAPHY;  Surgeon: Sherren Mocha, MD;  Location: [place] [facility];  Service: Cardiovascular;  Laterality: N/A;  . TEE WITHOUT CARDIOVERSION N/A 07/20/2019   Procedure: TRANSESOPHAGEAL ECHOCARDIOGRAM (TEE);  Surgeon: Jerline Pain, MD;  Location: [facility] ENDOSCOPY;  Service: Cardiovascular;  Laterality: N/A;  . TRANSCATHETER AORTIC VALVE REPLACEMENT, TRANSFEMORAL N/A 03/01/2019   Procedure: TRANSCATHETER AORTIC VALVE REPLACEMENT, TRANSFEMORAL;  Surgeon: Sherren Mocha, MD;  Location: [place] [facility];  Service: Open Heart Surgery;  Laterality: N/A;    Allergies  Allergen Reactions  . 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," as the patient is a Jehovah's Witness    Outpatient Encounter Medications as of 01/18/2020  Medication Sig  . acetaminophen (TYLENOL) 325 MG tablet Take 650 mg by mouth every 6 (six) hours as needed. Notify MD  id not relieved (Not to exceed 3000 mg ) in 24 hour per stranding order  . acetaminophen (TYLENOL) 325 MG tablet Take 650 mg by mouth in the morning.  Marland Kitchen amLODipine (NORVASC) 5 MG tablet Take 1 tablet (5 mg total) by mouth daily.  Marland Kitchen amoxicillin (AMOXIL) 500 MG capsule Take 2,000 mg by mouth as needed. Prior to dental procedure  . aspirin 81 MG EC tablet TAKE 1 TABLET (81 MG TOTAL) BY MOUTH DAILY. SWALLOW WHOLE.  Marland Kitchen atorvastatin (LIPITOR) 80 MG tablet Take 1 tablet (80 mg total) by mouth at bedtime.  . clopidogrel (PLAVIX) 75 MG tablet Take 1 tablet (75 mg total) by mouth daily with breakfast.  . diclofenac Sodium (VOLTAREN) 1 % GEL Apply 4 g topically 4 (four) times daily as needed (Pain). Apply to bilateral hips and knees  . furosemide (LASIX) 80 MG tablet Take 80 mg by mouth 2 (two) times daily.  Marland Kitchen gabapentin (NEURONTIN) 100 MG capsule Take 1 capsule (100 mg total) by mouth at bedtime.  . hydrALAZINE (APRESOLINE) 50 MG tablet Take 1 tablet (50 mg total) by mouth 3 (three) times daily.  . Insulin Degludec-Liraglutide (XULTOPHY) 100-3.6 UNIT-MG/ML SOPN  Inject 14 Units into the skin daily.  Marland Kitchen lactulose (CHRONULAC) 10 GM/15ML solution Take 20 g by mouth 2 (two) times daily.   . metoprolol succinate (TOPROL-XL) 25 MG 24 hr tablet Take 1 tablet (25 mg total) by mouth daily.  Marland Kitchen oxyCODONE-acetaminophen (PERCOCET) 5-325 MG tablet Take 1 tablet by mouth every 4 (four) hours as needed for severe pain.  Marland Kitchen PARoxetine (PAXIL) 30 MG tablet TAKE 1 TABLET BY MOUTH DAILY  . potassium chloride SA (KLOR-CON) 20 MEQ tablet Take 20 mEq by mouth daily.    No facility-administered encounter medications on file as of 01/18/2020.    Review of Systems  GENERAL: No fever or chills  MOUTH and THROAT: Denies oral discomfort, gingival pain or bleeding RESPIRATORY: no cough, SOB, DOE, wheezing, hemoptysis CARDIAC: No chest pain, edema or palpitations GI: No abdominal pain, diarrhea, constipation, heart burn, nausea or  vomiting GU: Denies dysuria, frequency, hematuria, incontinence, or discharge NEUROLOGICAL: Denies dizziness, syncope, numbness, or headache PSYCHIATRIC: Denies feelings of depression or anxiety. No report of hallucinations, insomnia, paranoia, or agitation   Immunization History  Administered Date(s) Administered  . Fluad Quad(high Dose 65+) 02/11/2019  . Influenza Split 11/12/2010, 11/10/2011  . Influenza Whole 11/07/2009  . Influenza,inj,Quad PF,6+ Mos 10/19/2012, 01/05/2014, 11/08/2015, 01/29/2017  . Influenza-Unspecified 12/15/2019  . Moderna SARS-COVID-2 Vaccination 03/09/2019, 04/06/2019  . Pneumococcal Conjugate-13 06/07/2014  . Pneumococcal Polysaccharide-23 12/26/1994, 03/05/2010  . Td 09/24/1993  . Tdap 11/12/2010, 07/12/2013   Pertinent  Health Maintenance Due  Topic Date Due  . FOOT EXAM  02/24/2020 (Originally 07/10/2017)  . OPHTHALMOLOGY EXAM  02/24/2020 (Originally 11/07/2016)  . LIPID PANEL  02/07/2020  . HEMOGLOBIN A1C  04/05/2020  . INFLUENZA VACCINE  Completed  . DEXA SCAN  Completed  . PNA vac Low Risk Adult  Completed   Fall Risk  01/10/2020 08/02/2019 06/16/2019 05/19/2019 05/04/2019  Falls in the past year? $RemoveBe'1 1 1 1 'AgyAFGtin$ 0  Comment - - pt denies-but dtr stated pt had a fall in last mths pt denies falls-but dtr states pt has had a fall -  Number falls in past yr: 1 1 0 0 -  Injury with Fall? 0 0 0 0 -  Risk Factor Category  - - - - -  Risk for fall due to : History of fall(s);Impaired balance/gait Impaired mobility History of fall(s);Impaired balance/gait;Impaired mobility;Medication side effect History of fall(s);Impaired balance/gait;Impaired mobility;Medication side effect No Fall Risks  Risk for fall due to: Comment - - - - -  Follow up Falls evaluation completed;Education provided;Falls prevention discussed Falls evaluation completed Falls evaluation completed;Falls prevention discussed Falls evaluation completed Falls evaluation completed     Vitals:    01/18/20 1200  BP: (!) 108/57  Pulse: 65  Resp: 16  Temp: (!) 97.5 F (36.4 C)  Weight: 159 lb 9.6 oz (72.4 kg)  Height: $Remove'5\' 5"'tQQEkIL$  (1.651 m)   Body mass index is 26.56 kg/m.  Physical Exam  GENERAL APPEARANCE: Well nourished. In no acute distress. Normal body habitus SKIN:  Left heel is covered with dressing, right great toe with dressing, dry MOUTH and THROAT: Lips are without lesions. Oral mucosa is moist and without lesions. Tongue is normal in shape, size, and color and without lesions RESPIRATORY: Breathing is even & unlabored, BS CTAB CARDIAC: RRR, no murmur,no extra heart sounds, no edema GI: Abdomen soft, normal BS, no masses, no tenderness EXTREMITIES:  Able to move X 4 extremities NEUROLOGICAL: There is no tremor. Speech is clear. Alert and oriented X  3.  PSYCHIATRIC: Affect and behavior are appropriate  Labs reviewed: Recent Labs    02/07/19 1323 02/07/19 1527 02/08/19 0556 02/09/19 0508 03/02/19 0253 05/04/19 1144 07/20/19 0301 07/21/19 0356 08/11/19 1638 08/16/19 0000 08/23/19 1701 08/23/19 1701 09/20/19 0000 09/20/19 0000 11/25/19 0000 01/04/20 1129 01/04/20 1222  NA 143   < > 145   < > 140   < > 137   < > 139   < > 143   < > 138  --  142  --  139  K 3.3*   < > 4.1   < > 3.7   < > 3.6   < > 4.9   < > 4.4   < > 4.2   < > 4.4 4.5 4.5  CL 110   < > 109   < > 105   < > 98   < > 98   < > 102   < > 101  --  107  --  107  CO2 23   < > 27   < > 26   < > 27   < > 27   < > 25  --  23*  --  26*  --   --   GLUCOSE 203*   < > 143*   < > 115*   < > 239*   < > 171*  --  121*  --   --   --   --   --  109*  BUN 25*   < > 26*   < > 24*   < > 26*   < > 45*   < > 44*   < > 65*  --  27*  --  56*  CREATININE 1.48*   < > 1.58*   < > 1.47*   < > 1.56*   < > 1.70*   < > 1.62*  --  1.8*  --  1.6*  --  2.80*  CALCIUM 8.2*   < > 9.0   < > 8.8*   < > 8.6*   < > 9.0   < > 9.4  --  8.6*  --  8.6*  --   --   MG 1.7   < > 2.3  --  1.7  --  1.8  --   --   --   --   --   --   --   --   --    --   PHOS 3.6  --   --   --   --   --   --   --   --   --   --   --   --   --   --   --   --    < > = values in this interval not displayed.   Recent Labs    02/07/19 1323 02/07/19 1323 02/24/19 1020 02/24/19 1020 07/17/19 2150 11/25/19 0000 12/09/19 0000  AST 13*   < > 16  --  17 14  --   ALT 12   < > 13  --  13 15  --   ALKPHOS 96   < > 125   < > 141* 140* 149*  BILITOT 1.0  --  0.6  --  0.8  --   --   PROT 6.5  --  7.8  --  7.6  --   --   ALBUMIN 3.1*   < > 3.4*  --  3.4* 2.9*  --    < > = values in this interval not displayed.   Recent Labs    07/17/19 2150 07/17/19 2150 07/19/19 0219 07/19/19 0219 07/20/19 0723 08/02/19 0000 08/08/19 0000 08/08/19 0000 09/20/19 0000 11/25/19 0000 01/04/20 1222  WBC 18.9*   < > 16.1*   < > 9.2   < > 7.7  --  8.0 6.9  --   NEUTROABS 17.3*  --   --   --   --    < > 5  --  5 4  --   HGB 12.8   < > 10.3*   < > 10.3*   < > 10.9*   < > 11.0* 10.3* 12.6  HCT 40.0   < > 32.2*   < > 32.4*   < > 33*   < > 34* 31* 37.0  MCV 94.8  --  93.9  --  93.9  --   --   --   --   --   --   PLT 256   < > 184   < > 193   < > 299  --  246 254  --    < > = values in this interval not displayed.   Lab Results  Component Value Date   TSH 1.630 02/07/2019   Lab Results  Component Value Date   HGBA1C 6.3 (H) 01/04/2020   Lab Results  Component Value Date   CHOL 99 02/07/2019   HDL 26 (L) 02/07/2019   LDLCALC 50 02/07/2019   TRIG 117 02/07/2019   CHOLHDL 3.8 02/07/2019    Significant Diagnostic Results in last 30 days:  PERIPHERAL VASCULAR CATHETERIZATION  Result Date: 01/04/2020 DATE OF SERVICE: 01/04/2020 PATIENT:  Thurnell Lose  75 y.o. female PRE-OPERATIVE DIAGNOSIS:  ischemia POST-OPERATIVE DIAGNOSIS:  * No post-op diagnosis entered * PROCEDURE:  1) US guided LCFA access 2) Aortogram 3) left lower extremity third order cannulation and angiogram ( 4) angioplasty of right anterior tibial artery (3x24mm Sterling x SURGEON:  Surgeon(s) and  Role:    * Cherre Robins, MD - Primary ASSISTANT: none ANESTHESIA:   local and IV sedation EBL: min BLOOD ADMINISTERED:none DRAINS: none LOCAL MEDICATIONS USED:  LIDOCAINE SPECIMEN:  none DISPOSITION OF SPECIMEN:  n/a COUNTS: confirmed correct. TOURNIQUET:  * No tourniquets in log * PLAN OF CARE: Admit for overnight observation PATIENT DISPOSITION:  PACU - hemodynamically stable.  Delay start of Pharmacological VTE agent (>24hrs) due to surgical blood loss or risk of bleeding: no INDICATION FOR PROCEDURE: JIMI GIZA is a 74 y.o. female with critical limb ischemia with tissue loss of right lower extremity. After careful discussion of risks, benefits, and alternatives the patient was offered. The patient understood and wished to proceed. DESCRIPTION OF PROCEDURE: After identification of the patient in the pre-operative holding area, the patient was transferred to the operating room. The patient was positioned supine on the operating room table. Anesthesia was induced. The groins were prepped and draped in standard fashion. A surgical pause was performed confirming correct patient, procedure, and operative location. To begin the left groin was anesthetized with 1% lidocaine. Using ultrasound guidance, the left common femoral artery was accessed with micropuncture technique. Fluoroscopy was used to confirm cannulation over the femoral head. Sheathogram was not performed. The 60F sheath was upsized to 36F. An 035 Britta Mccreedy wire was advanced into the distal aorta. Over the wire an omni flush catheter was advanced to the level of L2.  Aortogram was performed with CO2 revealing no hemodynamically significant stenosis. The right common iliac artery was selected with an 035 floppy angled glidewire. The wire was advanced into the superficial femoral artery. Over the wire the omni flush catheter was advanced into the external iliac artery. Selective angiography was performed revealing: Focal stenosis of the common femoral  artery >75% No hemodynamically significant stenosis in SFA, popliteal artery All tibial arteries occluded at their origin Reconstitution of the anterior tibial and peroneal artery at the ankle. The decision was made to intervene. The patient was heparinized with 8000 units of heparin. The sheath was exchanged for a 40F x 45cm sheath. Selective angiography of the left lower extremity was performed prior to intervention. An 018 V18 wire was used to select the anterior tibial artery and the chronic total occlusion was crossed endoluminally with a crossing catheter. Selective angiography through the catheter confirmed endoluminal position. The lesions were treated with: Angioplasty using a 3x298mm Sterling balloon with two separate treatments. Completion angiography revealed: Recanalization of the anterior tibial artery with inline flow to the foot The sheath was exchanged for a short 40F sheath. A mynx device was used to close the arteriotomy. This maldeployed. Manual pressure was held. Hemostasis was excellent upon completion. Upon completion of the case instrument and sharps counts were confirmed correct. The patient was transferred to the PACU in good condition. I was present for all portions of the procedure. [name]. Stanford Breed, MD [facility] Phone Number: [phone] 01/04/2020 4:28 PM    Assessment/Plan  1. Advance care planning -  Remains to be full code -  Discussed medications, vital signs and weights  2. Generalized weakness - currently having PT for therapeutic strengthening exercises -  Fall precautions  3. Wound infection -  Continue Doxycycline and Florator  4. Critical lower limb ischemia (HCC) - S/P right lower extremity angiography and right peripheral vascular balloon angioplasty -Continue aspirin and Plavix -Follows up with vascular surgery  5. Neuropathy -Stable, continue gabapentin -Percocet was discontinued due to being sleepy  6.  Type 2 diabetes mellitus with stage 3b chronic kidney disease, with long-term current use of insulin (HCC) Lab Results  Component Value Date   HGBA1C 6.3 (H) 01/04/2020   -Stable, continue Xultophy and CBG checks     Family/ staff Communication:  Discussed plan of care with resident, daughter and IDT.  Labs/tests ordered:  BMP in 1 week  Goals of care:  Long-term care   Durenda Age, DNP, MSN, FNP-BC [facility] [phone] (Monday-Friday 8:00 a.m. - 5:00 p.m.) [phone] (after hours)

## 2020-01-25 LAB — BASIC METABOLIC PANEL
BUN: 64 — AB (ref 4–21)
CO2: 27 — AB (ref 13–22)
Chloride: 101 (ref 99–108)
Creatinine: 1.8 — AB (ref 0.5–1.1)
Glucose: 81
Potassium: 4.7 (ref 3.4–5.3)
Sodium: 138 (ref 137–147)

## 2020-01-25 LAB — COMPREHENSIVE METABOLIC PANEL
Calcium: 8.7 (ref 8.7–10.7)
GFR calc Af Amer: 31.77
GFR calc non Af Amer: 27.41

## 2020-01-26 LAB — CBC AND DIFFERENTIAL
HCT: 34 — AB (ref 36–46)
Hemoglobin: 11.1 — AB (ref 12.0–16.0)
Neutrophils Absolute: 5.6
Platelets: 190 (ref 150–399)
WBC: 9.2

## 2020-01-26 LAB — CBC: RBC: 3.74 — AB (ref 3.87–5.11)

## 2020-01-27 ENCOUNTER — Non-Acute Institutional Stay (SKILLED_NURSING_FACILITY): Payer: Medicare Other | Admitting: Internal Medicine

## 2020-01-27 ENCOUNTER — Encounter: Payer: Self-pay | Admitting: Internal Medicine

## 2020-01-27 DIAGNOSIS — N183 Chronic kidney disease, stage 3 unspecified: Secondary | ICD-10-CM | POA: Insufficient documentation

## 2020-01-27 DIAGNOSIS — R58 Hemorrhage, not elsewhere classified: Secondary | ICD-10-CM | POA: Diagnosis not present

## 2020-01-27 DIAGNOSIS — N1832 Chronic kidney disease, stage 3b: Secondary | ICD-10-CM

## 2020-01-27 DIAGNOSIS — I700 Atherosclerosis of aorta: Secondary | ICD-10-CM

## 2020-01-27 NOTE — Assessment & Plan Note (Addendum)
12/1 creatinine 1.8/GFR 31.77 improved from prior creatinine of 2.0/GFR 27.9 Slight progression of anemia unlikely to be CKD related.

## 2020-01-27 NOTE — Assessment & Plan Note (Signed)
Blood was noted on the bedpan and 12/3.  She gives a history of having suprapubic discomfort of an aching nature 12/2.  Clinically there is no sign of ischemic bowel as etiology of the bleeding.

## 2020-01-27 NOTE — Assessment & Plan Note (Addendum)
CBC 12/2 demonstrated minimal progression of anemia with hemoglobin 11.1, down from 11.7.  Platelet count was normal. Despite negative GU symptoms; urinalysis will be collected. CBC will be repeated 12/6.

## 2020-01-27 NOTE — Progress Notes (Signed)
   NURSING HOME LOCATION:  [facility] ROOM NUMBER:  [id number]   CODE STATUS:  FULL CODE  PCP:   Hendricks Limes, MD  [place] [address]  This is a nursing facility follow up for specific acute issue of abnormal bleeding.   Interim medical record and care since last [place] visit was updated with review of diagnostic studies and change in clinical status since last visit were documented.  HPI: Staff reported blood on the bed pad without definitive cause.  The patient states she has had no bleeding dyscrasias.  Yesterday she did describe aching discomfort in the suprapubic area.  She denies any active GU symptoms.   She is on low-dose aspirin and Plavix but no other anticoagulants. CBC was performed yesterday and revealed slight progression of anemia.  Hemoglobin previously was 11.7/hematocrit 36; on 12/2 hemoglobin 11.1/hematocrit 34. PMH includes TAVR . She had peripheral vascular balloon angioplasty of RLE 01/04/2020.  Review of systems: She is alert and oriented and gives an excellent history. Constitutional: No fever, significant weight change, fatigue  Eyes: No redness, discharge, pain, vision change ENT/mouth: No nasal congestion,  purulent discharge, earache, change in hearing, sore throat  Cardiovascular: No chest pain, palpitations, paroxysmal nocturnal dyspnea, claudication, edema  Respiratory: No cough, sputum production, hemoptysis, DOE, significant snoring, apnea   Gastrointestinal: No heartburn, dysphagia, nausea /vomiting, rectal bleeding, melena, change in bowels Genitourinary: No dysuria, hematuria, pyuria, incontinence, nocturia Dermatologic: No rash, pruritus, ecchymoses Neurologic: No dizziness, headache, syncope, seizures Psychiatric: No significant anxiety, depression, insomnia, anorexia Endocrine: No change in hair/skin/nails, excessive thirst, excessive hunger, excessive urination  Hematologic/lymphatic: No significant   lymphadenopathy Allergy/immunology: No itchy/watery eyes, significant sneezing, urticaria, angioedema  Physical exam:  Pertinent or positive findings: She appears adequately nourished.  She is missing multiple maxillary and mandibular teeth.  There is a very faint, low-grade honking murmur at the base.  Bowel sounds are noted in the left chest.  Abdomen was nontender to palpation in all quadrants.  Right lower extremity is atrophic.  The right foot is in a protective bootie.There is a dressing over sacral wound.  This was removed enough to establish that there was no active bleeding from this site.  A limited vaginal exam revealed suggestion of slight candidiasis without any active bleeding.  Rectal exam revealed soft brown stool was was Hemoccult negative.  General appearance: no acute distress, increased work of breathing is present.   Lymphatic: No lymphadenopathy about the head, neck, axilla. Eyes: No conjunctival inflammation or lid edema is present. There is no scleral icterus. Ears:  External ear exam shows no significant lesions or deformities.   Nose:  External nasal examination shows no deformity or inflammation. Nasal mucosa are pink and moist without lesions, exudates Oral exam:  Lips and gums are healthy appearing. There is no oropharyngeal erythema or exudate. Neck:  No thyromegaly, masses, tenderness noted.    Heart:  Normal rate and regular rhythm. S1 and S2 normal without gallop, click, rub .  Lungs:  without wheezes, rhonchi, rales, rubs. Abdomen: Bowel sounds are normal; no organomegaly, hernias, masses. GU: Deferred  Extremities:  No cyanosis, clubbing, edema  Neurologic exam :Balance, Rhomberg, finger to nose testing could not be completed due to clinical state Skin: Warm & dry w/o tenting. No significant rash.  See summary under each active problem in the Problem List with associated updated therapeutic plan

## 2020-01-27 NOTE — Patient Instructions (Signed)
See assessment and plan under each diagnosis in the problem list and acutely for this visit 

## 2020-01-30 ENCOUNTER — Other Ambulatory Visit: Payer: Self-pay

## 2020-01-30 ENCOUNTER — Telehealth: Payer: Self-pay | Admitting: *Deleted

## 2020-01-30 DIAGNOSIS — I739 Peripheral vascular disease, unspecified: Secondary | ICD-10-CM

## 2020-01-30 LAB — CBC AND DIFFERENTIAL
HCT: 35 — AB (ref 36–46)
Hemoglobin: 11.5 — AB (ref 12.0–16.0)
Neutrophils Absolute: 4.5
Platelets: 197 (ref 150–399)
WBC: 8

## 2020-01-30 LAB — CBC: RBC: 3.86 — AB (ref 3.87–5.11)

## 2020-01-30 NOTE — Telephone Encounter (Signed)
Pt's daughter calls, she is informed that [facility] is no longer pt's pcp. She ask for dr Software engineer and is informed dr Software engineer is no longer with [facility]. She ask who should she call. She is given dr hopper's name and ph# and referred to csw and charge nurse at facility. She is wished well and call ended.

## 2020-02-02 ENCOUNTER — Other Ambulatory Visit: Payer: Self-pay | Admitting: Sports Medicine

## 2020-02-02 ENCOUNTER — Ambulatory Visit (INDEPENDENT_AMBULATORY_CARE_PROVIDER_SITE_OTHER): Payer: Medicare Other | Admitting: Sports Medicine

## 2020-02-02 ENCOUNTER — Other Ambulatory Visit: Payer: Self-pay

## 2020-02-02 ENCOUNTER — Telehealth: Payer: Self-pay

## 2020-02-02 ENCOUNTER — Encounter: Payer: Self-pay | Admitting: Sports Medicine

## 2020-02-02 DIAGNOSIS — M869 Osteomyelitis, unspecified: Secondary | ICD-10-CM

## 2020-02-02 DIAGNOSIS — I7025 Atherosclerosis of native arteries of other extremities with ulceration: Secondary | ICD-10-CM

## 2020-02-02 DIAGNOSIS — I739 Peripheral vascular disease, unspecified: Secondary | ICD-10-CM

## 2020-02-02 DIAGNOSIS — L97511 Non-pressure chronic ulcer of other part of right foot limited to breakdown of skin: Secondary | ICD-10-CM

## 2020-02-02 DIAGNOSIS — L89610 Pressure ulcer of right heel, unstageable: Secondary | ICD-10-CM | POA: Diagnosis not present

## 2020-02-02 DIAGNOSIS — S90822A Blister (nonthermal), left foot, initial encounter: Secondary | ICD-10-CM

## 2020-02-02 DIAGNOSIS — E0800 Diabetes mellitus due to underlying condition with hyperosmolarity without nonketotic hyperglycemic-hyperosmolar coma (NKHHC): Secondary | ICD-10-CM | POA: Diagnosis not present

## 2020-02-02 NOTE — Progress Notes (Signed)
Subjective: [name] is a 74 y.o. female patient seen in office for evaluation of ulceration of the right great toe and heel ulcer and now new wound on left.  Patient denies any foot pain but does admit to feeling weak and tired and not eating much after having her vascular procedure.  Patient denies nausea vomiting fever chills or any other constitutional symptoms at this time.  Patient Active Problem List   Diagnosis Date Noted  . Bleeding 01/27/2020  . CKD (chronic kidney disease) stage 3, GFR 30-59 ml/min (HCC) 01/27/2020  . Critical lower limb ischemia ([place]) 01/04/2020  . Skin pustule 10/20/2019  . Neurocognitive deficits 08/04/2019  . Controlled type 2 diabetes mellitus without complication ([place]) 07/86/7544  . CVA (cerebral vascular accident) ([place]) 07/23/2019  . Infection of pacemaker lead wire ([place]) 07/21/2019  . Fever   . History of transcatheter aortic valve replacement (TAVR)   . Septic encephalopathy 07/18/2019  . Sacral decubitus ulcer, stage II ([place]) 07/18/2019  . Bacteremia due to group B Streptococcus 07/18/2019  . Sepsis ([place]) 07/18/2019  . Complete heart block ([place])   . Chronic diastolic congestive heart failure ([place]) 02/07/2019  . AKI (acute kidney injury) ([place]) 07/22/2017  . Peripheral arterial disease ([place]) 06/18/2017  . B12 deficiency 05/01/2017  . Bilateral lower extremity edema 11/08/2015  . Aortic atherosclerosis ([place]) 06/07/2014  . Abnormality of gait 05/29/2012  . Constipation 03/16/2012  . HOCM (hypertrophic obstructive cardiomyopathy) ([place]) 06/11/2011  . Routine health maintenance 06/10/2010  . Hyperlipidemia   . Refusal of blood transfusions as patient is Jehovah's Witness   . History of adenocarcinoma of breast   . Osteoporosis   . Status post CVA   . Aortic stenosis s/p Tissure AVR 2006   . CAD (coronary artery disease)   . Hypertension   . Depression 01/23/2006  . DM (diabetes mellitus) ([place]) 03/25/1990  . Benign hypertensive heart and  kidney disease with diastolic CHF, NYHA class 3 and CKD stage 3 ([place]) 1992  . Bradycardia 1992   Current Outpatient Medications on File Prior to Visit  Medication Sig Dispense Refill  . acetaminophen (TYLENOL) 325 MG tablet Take 650 mg by mouth every 6 (six) hours as needed. Notify MD id not relieved (Not to exceed 3000 mg ) in 24 hour per stranding order    . acetaminophen (TYLENOL) 325 MG tablet Take 650 mg by mouth in the morning.    Marland Kitchen amLODipine (NORVASC) 5 MG tablet Take 1 tablet (5 mg total) by mouth daily. 90 tablet 3  . amoxicillin (AMOXIL) 500 MG capsule Take 2,000 mg by mouth as needed. Prior to dental procedure    . aspirin 81 MG EC tablet TAKE 1 TABLET (81 MG TOTAL) BY MOUTH DAILY. SWALLOW WHOLE. 30 tablet 3  . atorvastatin (LIPITOR) 80 MG tablet Take 1 tablet (80 mg total) by mouth at bedtime. 90 tablet 3  . clopidogrel (PLAVIX) 75 MG tablet Take 1 tablet (75 mg total) by mouth daily with breakfast. 90 tablet 3  . diclofenac Sodium (VOLTAREN) 1 % GEL Apply 4 g topically 4 (four) times daily as needed (Pain). Apply to bilateral hips and knees    . doxycycline (VIBRA-TABS) 100 MG tablet Take 100 mg by mouth 2 (two) times daily.    . Ensure (ENSURE) Take 237 mLs by mouth in the morning and at bedtime. Strawberry if available    . furosemide (LASIX) 80 MG tablet Take 80 mg by mouth 2 (two) times daily.    Marland Kitchen  hydrALAZINE (APRESOLINE) 50 MG tablet Take 1 tablet (50 mg total) by mouth 3 (three) times daily. 270 tablet 0  . Insulin Degludec-Liraglutide (XULTOPHY) 100-3.6 UNIT-MG/ML SOPN Inject 14 Units into the skin daily.    Marland Kitchen lactulose (CHRONULAC) 10 GM/15ML solution Take 20 g by mouth 2 (two) times daily.     . metoprolol succinate (TOPROL-XL) 25 MG 24 hr tablet Take 1 tablet (25 mg total) by mouth daily. 90 tablet 1  . Nutritional Supplements (NUTRITIONAL SUPPLEMENT PO) Take 1 each by mouth in the morning and at bedtime. Magic Cup    . PARoxetine (PAXIL) 30 MG tablet TAKE 1 TABLET BY  MOUTH DAILY 30 tablet 11  . potassium chloride SA (KLOR-CON) 20 MEQ tablet Take 20 mEq by mouth daily.     Marland Kitchen saccharomyces boulardii (FLORASTOR) 250 MG capsule Take 250 mg by mouth 2 (two) times daily.     No current facility-administered medications on file prior to visit.   Allergies  Allergen Reactions  . 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," as the patient is a Jehovah's Witness    Recent Results (from the past 2160 hour(s))  WOUND CULTURE     Status: Abnormal   Collection Time: 11/24/19  9:03 AM   Specimen: Wound  Result Value Ref Range   MICRO NUMBER: [id number]    SPECIMEN QUALITY: Adequate    SOURCE: RIGHT HALLUX    STATUS: FINAL    GRAM STAIN:      Moderate White blood cells seen No epithelial cells seen Many Gram positive cocci in pairs Many Gram negative bacilli   ISOLATE 1: Morganella morganii (A)     Comment: Heavy growth of Morganella morganii   ISOLATE 2: Enterobacter cloacae (A)     Comment: Heavy growth of Enterobacter cloacae      Susceptibility   Enterobacter cloacae - AEROBIC CULT, GRAM STAIN NEGATIVE 2    AMOX/CLAVULANIC 8 Resistant     CEFAZOLIN >=64 Resistant     CEFEPIME <=1 Sensitive     CEFTRIAXONE 8 Resistant     CIPROFLOXACIN <=0.25 Sensitive     LEVOFLOXACIN 1 Intermediate     ERTAPENEM <=0.5 Sensitive     GENTAMICIN >=16 Resistant     IMIPENEM <=0.25 Sensitive     PIP/TAZO <=4 Sensitive     TOBRAMYCIN 8 Intermediate     TRIMETH/SULFA* >=320 Resistant      * For infections other than uncomplicated UTIcaused by E. coli, K. pneumoniae or P. mirabilis:Cefazolin is resistant if MIC > or = 8 mcg/mL.(Distinguishing susceptible versus intermediatefor isolates with MIC < or = 4 mcg/mL requiresadditional testing.)Legend:S = Susceptible  I = IntermediateR = Resistant  NS = Not susceptible* = Not tested  NR = Not reported**NN = See antimicrobic comments   Morganella morganii - AEROBIC CULT, GRAM STAIN NEGATIVE 1    AMOX/CLAVULANIC >=32 Resistant      AMPICILLIN >=32 Resistant     AMPICILLIN/SULBACTAM >=32 Resistant     CEFAZOLIN* >=64 Resistant      * For infections other than uncomplicated UTIcaused by E. coli, K. pneumoniae or P. mirabilis:Cefazolin is resistant if MIC > or = 8 mcg/mL.(Distinguishing susceptible versus intermediatefor isolates with MIC < or = 4 mcg/mL requiresadditional testing.)    CEFEPIME <=1 Sensitive     CEFTRIAXONE <=1 Sensitive     CIPROFLOXACIN >=4 Resistant     LEVOFLOXACIN >=8 Resistant     ERTAPENEM <=0.5 Sensitive     GENTAMICIN <=1 Sensitive  PIP/TAZO <=4 Sensitive     TOBRAMYCIN <=1 Sensitive     TRIMETH/SULFA >=320 Resistant   CBC and differential     Status: Abnormal   Collection Time: 11/25/19 12:00 AM  Result Value Ref Range   Hemoglobin 10.3 (A) 12.0 - 16.0   HCT 31 (A) 36 - 46   Neutrophils Absolute 4    Platelets 254 150 - 399   WBC 6.9   CBC     Status: Abnormal   Collection Time: 11/25/19 12:00 AM  Result Value Ref Range   RBC 3.49 (A) 3.87 - 3.26  Basic metabolic panel     Status: Abnormal   Collection Time: 11/25/19 12:00 AM  Result Value Ref Range   Glucose 86    BUN 27 (A) 4 - 21   CO2 26 (A) 13 - 22   Creatinine 1.6 (A) 0.5 - 1.1   Potassium 4.4 3.4 - 5.3   Sodium 142 137 - 147   Chloride 107 99 - 108  Comprehensive metabolic panel     Status: Abnormal   Collection Time: 11/25/19 12:00 AM  Result Value Ref Range   Globulin 2.8    GFR calc Af Amer 34.88    GFR calc non Af Amer 30.09    Calcium 8.6 (A) 8.7 - 10.7   Albumin 2.9 (A) 3.5 - 5.0  Hepatic function panel     Status: Abnormal   Collection Time: 11/25/19 12:00 AM  Result Value Ref Range   Alkaline Phosphatase 140 (A) 25 - 125   ALT 15 7 - 35   AST 14 13 - 35   Bilirubin, Total 0.2   Hepatic function panel     Status: Abnormal   Collection Time: 12/09/19 12:00 AM  Result Value Ref Range   Alkaline Phosphatase 149 (A) 25 - 125  Glucose, capillary     Status: None   Collection Time: 01/04/20  9:50 AM   Result Value Ref Range   Glucose-Capillary 94 70 - 99 mg/dL    Comment: Glucose reference range applies only to samples taken after fasting for at least 8 hours.   Comment 1 Notify RN   SARS Coronavirus 2 by RT PCR (hospital order, performed in [facility] hospital lab) Nasopharyngeal Nasopharyngeal Swab     Status: None   Collection Time: 01/04/20  9:53 AM   Specimen: Nasopharyngeal Swab  Result Value Ref Range   SARS Coronavirus 2 NEGATIVE NEGATIVE    Comment: (NOTE) SARS-CoV-2 target nucleic acids are NOT DETECTED.  The SARS-CoV-2 RNA is generally detectable in upper and lower respiratory specimens during the acute phase of infection. The lowest concentration of SARS-CoV-2 viral copies this assay can detect is 250 copies / mL. A negative result does not preclude SARS-CoV-2 infection and should not be used as the sole basis for treatment or other patient management decisions.  A negative result may occur with improper specimen collection / handling, submission of specimen other than nasopharyngeal swab, presence of viral mutation(s) within the areas targeted by this assay, and inadequate number of viral copies (<250 copies / mL). A negative result must be combined with clinical observations, patient history, and epidemiological information.  Fact Sheet for Patients:   StrictlyIdeas.no  Fact Sheet for Healthcare Providers: BankingDealers.co.za  This test is not yet approved or  cleared by the [place] FDA and has been authorized for detection and/or diagnosis of SARS-CoV-2 by FDA under an Emergency Use Authorization (EUA).  This EUA will remain  in effect (meaning this test can be used) for the duration of the COVID-19 declaration under Section 564(b)(1) of the Act, 21 U.S.C. section 360bbb-3(b)(1), unless the authorization is terminated or revoked sooner.  Performed at [facility], [place] [address] [place],  [place] [address]   Potassium     Status: None   Collection Time: 01/04/20 11:29 AM  Result Value Ref Range   Potassium 4.5 3.5 - 5.1 mmol/L    Comment: Performed at [facility] [address], [place], [place] [address]  I-STAT, [place] [address]     Status: Abnormal   Collection Time: 01/04/20 12:22 PM  Result Value Ref Range   Sodium 139 135 - 145 mmol/L   Potassium 4.5 3.5 - 5.1 mmol/L   Chloride 107 98 - 111 mmol/L   BUN 56 (H) 8 - 23 mg/dL   Creatinine, Ser 2.80 (H) 0.44 - 1.00 mg/dL   Glucose, Bld 109 (H) 70 - 99 mg/dL    Comment: Glucose reference range applies only to samples taken after fasting for at least 8 hours.   Calcium, Ion 1.08 (L) 1.15 - 1.40 mmol/L   TCO2 24 22 - 32 mmol/L   Hemoglobin 12.6 12.0 - 15.0 g/dL   HCT 37.0 36.0 - 46.0 %  POCT Activated clotting time     Status: None   Collection Time: 01/04/20  4:12 PM  Result Value Ref Range   Activated Clotting Time 274 seconds  Glucose, capillary     Status: Abnormal   Collection Time: 01/04/20  5:07 PM  Result Value Ref Range   Glucose-Capillary 106 (H) 70 - 99 mg/dL    Comment: Glucose reference range applies only to samples taken after fasting for at least 8 hours.  Hemoglobin A1c     Status: Abnormal   Collection Time: 01/04/20  7:43 PM  Result Value Ref Range   Hgb A1c MFr Bld 6.3 (H) 4.8 - 5.6 %    Comment: (NOTE) Pre diabetes:          5.7%-6.4%  Diabetes:              >6.4%  Glycemic control for   <7.0% adults with diabetes    Mean Plasma Glucose 134.11 mg/dL    Comment: Performed at [facility] [address] [place], [place] [address]  Glucose, capillary     Status: Abnormal   Collection Time: 01/04/20 11:36 PM  Result Value Ref Range   Glucose-Capillary 197 (H) 70 - 99 mg/dL    Comment: Glucose reference range applies only to samples taken after fasting for at least 8 hours.  Glucose, capillary     Status: Abnormal   Collection Time: 01/05/20  5:48 AM  Result Value Ref Range    Glucose-Capillary 103 (H) 70 - 99 mg/dL    Comment: Glucose reference range applies only to samples taken after fasting for at least 8 hours.  Glucose, capillary     Status: Abnormal   Collection Time: 01/05/20 11:01 AM  Result Value Ref Range   Glucose-Capillary 158 (H) 70 - 99 mg/dL    Comment: Glucose reference range applies only to samples taken after fasting for at least 8 hours.  Glucose, capillary     Status: Abnormal   Collection Time: 01/05/20  4:50 PM  Result Value Ref Range   Glucose-Capillary 117 (H) 70 - 99 mg/dL    Comment: Glucose reference range applies only to samples taken after fasting for  at least 8 hours.  CBC and differential     Status: None   Collection Time: 01/13/20 12:00 AM  Result Value Ref Range   Hemoglobin 13.6 12.0 - 16.0   HCT 42 36 - 46   Neutrophils Absolute 8.40    Platelets 246 150 - 399   WBC 12.0   CBC     Status: None   Collection Time: 01/13/20 12:00 AM  Result Value Ref Range   RBC 4.59 3.87 - 2.77  Basic metabolic panel     Status: Abnormal   Collection Time: 01/13/20 12:00 AM  Result Value Ref Range   Glucose 129    BUN 80 (A) 4 - 21   CO2 23 (A) 13 - 22   Creatinine 2.4 (A) 0.5 - 1.1   Potassium 4.6 3.4 - 5.3   Sodium 134 (A) 137 - 147   Chloride 95 (A) 99 - 108  Comprehensive metabolic panel     Status: None   Collection Time: 01/13/20 12:00 AM  Result Value Ref Range   GFR calc Af Amer 21.99    GFR calc non Af Amer 18.97    Calcium 9.1 8.7 - 10.7  CBC and differential     Status: Abnormal   Collection Time: 01/17/20 12:00 AM  Result Value Ref Range   Hemoglobin 11.7 (A) 12.0 - 16.0   HCT 36 36 - 46   Neutrophils Absolute 5.10    Platelets 212 150 - 399   WBC 8.7   CBC     Status: None   Collection Time: 01/17/20 12:00 AM  Result Value Ref Range   RBC 3.95 3.87 - 8.24  Basic metabolic panel     Status: Abnormal   Collection Time: 01/17/20 12:00 AM  Result Value Ref Range   Glucose 102    BUN 80 (A) 4 - 21   CO2 23  (A) 13 - 22   Creatinine 2.0 (A) 0.5 - 1.1   Potassium 4.3 3.4 - 5.3   Sodium 137 137 - 147   Chloride 103 99 - 108  Comprehensive metabolic panel     Status: None   Collection Time: 01/17/20 12:00 AM  Result Value Ref Range   GFR calc Af Amer 27.91    GFR calc non Af Amer 24.08    Calcium 8.7 8.7 - 23.5  Basic metabolic panel     Status: Abnormal   Collection Time: 01/25/20 12:00 AM  Result Value Ref Range   Glucose 81    BUN 64 (A) 4 - 21   CO2 27 (A) 13 - 22   Creatinine 1.8 (A) 0.5 - 1.1   Potassium 4.7 3.4 - 5.3   Sodium 138 137 - 147   Chloride 101 99 - 108  Comprehensive metabolic panel     Status: None   Collection Time: 01/25/20 12:00 AM  Result Value Ref Range   GFR calc Af Amer 31.77    GFR calc non Af Amer 27.41    Calcium 8.7 8.7 - 10.7  CBC and differential     Status: Abnormal   Collection Time: 01/26/20 12:00 AM  Result Value Ref Range   Hemoglobin 11.1 (A) 12.0 - 16.0   HCT 34 (A) 36 - 46   Neutrophils Absolute 5.60    Platelets 190 150 - 399   WBC 9.2   CBC     Status: Abnormal   Collection Time: 01/26/20 12:00 AM  Result Value Ref Range  RBC 3.74 (A) 3.87 - 5.11    Objective: There were no vitals filed for this visit.  General: Patient is awake, alert, oriented x 3 and in no acute distress.  Dermatology: Skin is warm and dry bilateral with a full thickness ulceration present distal tuft of the right great toe  Ulceration measures 0.8 cm x 1 cm x 0.3 cm. There is a  Keratotic border with a now dry eschar base once pressed there was purulent drainage but no malodor, no erythema, localized edema. No other acute signs of infection.  To the right heel there is a dry eschar noted that measures 6 x 4 cm with eschar base no active drainage no bogginess there is mild edema but no malodor unchanged from prior.  To the left there is a dry blood blister noted with no noticeable signs of infection.  Vascular: Dorsalis Pedis pulse = 0/4 Bilateral,   Posterior Tibial pulse = 0/4 Bilateral,  Capillary Fill Time < 5 seconds  Neurologic: Protective sensation severely diminished with history of neuropathy.  Musculosketal: There is minimal pain with palpation to ulcerated areas on the right or left.  Severe end-stage bunion deformity.  No pain with compression to calves bilateral.   Recent Labs    07/17/19 2150  LABORGA GROUP B STREP(S.AGALACTIAE)ISOLATED    Assessment and Plan:  Problem List Items Addressed This Visit      Endocrine   DM (diabetes mellitus) ([place])    Other Visit Diagnoses    Decubitus ulcer of heel, right, unstageable ([place])    -  Primary   Skin ulcer of right great toe, limited to breakdown of skin ([place])       Osteomyelitis of great toe of right foot ([place])       PAD (peripheral artery disease) ([place])       Blister of heel, left, initial encounter         -Examined patient and discussed the progression of the wounds and treatment alternatives. -Cleansed ulceration using a saline moistened gauze debrided any loose tissue to the area at the right great toe and heel but did advise patient due to her circulation we cannot do any type of aggressive debridement because she may not be able to heal it.  Hemostasis was achieved with manuel pressure. Patient tolerated nonselective debridement procedure well without any discomfort or anesthesia necessary for this wound debridement.  -Applied Betadine and dry sterile dressing and wrote orders for the facility to continue with the same on the right and Prevalon dressings to the left. -Continue with Doxycyline to prevent superimposed infection right foot ulcers and new left heel blister.  Wound culture was obtained today from the right great toe we will call facility if there is any need for change in antibiotics. -Continue with pillow offloading of heel to prevent worsening of heel wounds when in bed -Continue with vascular follow-up patient has appointment on 12/14 - Advised  patient to go to the ER or return to office if the wound worsens or if constitutional symptoms are present. -Return in 2 to 3 weeks for wound care or sooner problems or issues arise  Landis Martins, DPM

## 2020-02-02 NOTE — Telephone Encounter (Signed)
[facility] called today wanting to clarify the order. I explained what Dr. Cannon Kettle orders states to cleansed ulceration using a saline moistened gauze debrided any loose tissue to the area at the right great toe and heel. Also they should applied betadine and dry sterile dressing and to continue with the same on the right and Prevalon dressings to the left. I told the faculty to please call us back if they had any additional questions.

## 2020-02-03 ENCOUNTER — Encounter: Payer: Self-pay | Admitting: Adult Health

## 2020-02-03 ENCOUNTER — Non-Acute Institutional Stay (SKILLED_NURSING_FACILITY): Payer: Medicare Other | Admitting: Adult Health

## 2020-02-03 DIAGNOSIS — Z794 Long term (current) use of insulin: Secondary | ICD-10-CM | POA: Diagnosis not present

## 2020-02-03 DIAGNOSIS — E1140 Type 2 diabetes mellitus with diabetic neuropathy, unspecified: Secondary | ICD-10-CM | POA: Diagnosis not present

## 2020-02-03 DIAGNOSIS — M869 Osteomyelitis, unspecified: Secondary | ICD-10-CM | POA: Diagnosis not present

## 2020-02-03 DIAGNOSIS — R634 Abnormal weight loss: Secondary | ICD-10-CM

## 2020-02-03 NOTE — Progress Notes (Incomplete)
Location:  [facility] Room Number: 217-A Place of Service:  SNF (31) Provider:  Kenard Gower, DNP, FNP-BC  Patient Care Team: Pecola Lawless, MD as PCP - General (Internal Medicine) Lars Masson, MD as PCP - Cardiology (Cardiology) Regan Lemming, MD as PCP - Electrophysiology (Cardiology) Medina-Vargas, Margit Banda, NP as Nurse Practitioner (Internal Medicine) Rehab, [facility] (Skilled Nursing Facility)  Extended Emergency Contact Information Primary Emergency Contact: Audrey [name] Home Phone: [phone] Mobile Phone: [phone] Relation: Daughter Secondary Emergency Contact: Audrey [name]  [place] Home Phone: [phone] Relation: Daughter  Code Status:  FULL CODE  Goals of care: Advanced Directive information Advanced Directives 01/04/2020  Does Patient Have a Medical Advance Directive? No  Type of Advance Directive -  Does patient want to make changes to medical advance directive? -  Copy of Healthcare Power of Attorney in Chart? -  Would patient like information on creating a medical advance directive? No - Patient declined  Pre-existing out of facility DNR order (yellow form or pink MOST form) -     Chief Complaint  Patient presents with  . Acute Visit    Patient is seen for a poor appetite.     HPI:  Pt is a 74 y.o. [name] seen today for an acute visit secondary to poor appetite.  She is a long-term care resident of [facility].  He has a PMH of chronic kidney disease, CAD, severe aortic stenosis, history of breast cancer, essential hypertension, dyslipidemia, stroke and chronic diastolic CHF. She was seen in her room today.  She was seen in her room today. She stated that she has poor appetite. She had a weight loss of 14.6 lbs in 2 months. She is currently taking Doxycycline 100 mg BID for osteomyelitis of right great toe. No reported  fever.   Past Medical History:  Diagnosis Date  . Acute on chronic diastolic CHF (congestive heart failure) (HCC) 06/22/2017  . Acute on chronic renal failure (HCC) 05/25/2011  . Adenocarcinoma of breast (HCC) 1996   Completed tamoxifen and had mastectomy.  . Aortic stenosis, severe    s/p aortic valve replacement with porcine valve 06/2004.  ECHO 2010 EF 65%, LVH, diastolic dysfxn, Bioprostetic aoritc valve, mild AS. ECHO 2013 EF 60%, Nl aortic artificial valve, dynamic obstruction in the outflow tract   Class IIb rec for annual TTE after 5 yrs. She had a TTE 2013    . Arthritis   . CAD (coronary artery disease) 2006   s/p CABG (5/06) w/ saphenous vein to RCA at time of AVR  . CKD (chronic kidney disease) stage 2, GFR 60-89 ml/min 06/03/2011   She is no longer taking NSAIDs.   Marland Kitchen CVA (cerebral infarction) 2006   Post-op from AVR. Presumed embolic in nature. Carotid stenosis of R 60-79%. Repeat dopplers 4/10 no R stenosis and L stenosis of 1-29%.  . Depression    Controlled on Paxil  . Diabetes mellitus 1992   Dx 04/25/1990. Now insulin dependent, started 2008. On ACEI.   . Diverticulosis 2001  . History of 2019 novel coronavirus disease (COVID-19)   . Hyperlipidemia    Mgmt with a statin  . Hypertension    Requires 4 drug tx  . Osteoporosis 2006   DEXA 10/06 : L femur T -2.8, R -2.7. Lumbar T -2.4. On bisphosphonates and  Calcium / Vit D.  . Peripheral vascular disease (HCC)   . Presence of  permanent cardiac pacemaker   . Refusal of blood transfusions as patient is Jehovah's Witness   . Stroke [facility])    Past Surgical History:  Procedure Laterality Date  . ABDOMINAL HYSTERECTOMY  1987   for fibroids  . AORTIC VALVE REPLACEMENT  2006  . CHOLECYSTECTOMY    . CORONARY ARTERY BYPASS GRAFT  2006   Saphenous vein to RCA at time of AVR. Course complicated by acute respiratory failure, post-op PTX, ARI, ileus, CVA  . LOWER EXTREMITY ANGIOGRAPHY N/A 01/04/2020   Procedure: LOWER EXTREMITY  ANGIOGRAPHY - Right;  Surgeon: Cherre Robins, MD;  Location: [place] [facility];  Service: Cardiovascular;  Laterality: N/A;  . MASTECTOMY Left 1995   L for adenocarcinoma  . PACEMAKER IMPLANT N/A 02/09/2019   Procedure: PACEMAKER IMPLANT;  Surgeon: Constance Haw, MD;  Location: [place] [facility];  Service: Cardiovascular;  Laterality: N/A;  . PERIPHERAL VASCULAR BALLOON ANGIOPLASTY  01/04/2020   Procedure: PERIPHERAL VASCULAR BALLOON ANGIOPLASTY;  Surgeon: Cherre Robins, MD;  Location: [place] [facility];  Service: Cardiovascular;;  Rt AT  . RIGHT HEART CATH AND CORONARY/GRAFT ANGIOGRAPHY N/A 02/09/2019   Procedure: RIGHT HEART CATH AND CORONARY/GRAFT ANGIOGRAPHY;  Surgeon: Sherren Mocha, MD;  Location: [place] [facility];  Service: Cardiovascular;  Laterality: N/A;  . TEE WITHOUT CARDIOVERSION N/A 07/20/2019   Procedure: TRANSESOPHAGEAL ECHOCARDIOGRAM (TEE);  Surgeon: Jerline Pain, MD;  Location: [facility] ENDOSCOPY;  Service: Cardiovascular;  Laterality: N/A;  . TRANSCATHETER AORTIC VALVE REPLACEMENT, TRANSFEMORAL N/A 03/01/2019   Procedure: TRANSCATHETER AORTIC VALVE REPLACEMENT, TRANSFEMORAL;  Surgeon: Sherren Mocha, MD;  Location: [place] [facility] LAB;  Service: Open Heart Surgery;  Laterality: N/A;    Allergies  Allergen Reactions  . 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," as the patient is a Jehovah's Witness    Outpatient Encounter Medications as of 02/03/2020  Medication Sig  . acetaminophen (TYLENOL) 325 MG tablet Take 650 mg by mouth every 6 (six) hours as needed. Notify MD id not relieved (Not to exceed 3000 mg ) in 24 hour per stranding order  . acetaminophen (TYLENOL) 325 MG tablet Take 650 mg by mouth in the morning.  . Amino Acids-Protein Hydrolys (FEEDING SUPPLEMENT, PRO-STAT SUGAR FREE 64,) LIQD Take 30 mLs by mouth in the morning and at bedtime.  Marland Kitchen amLODipine (NORVASC) 5 MG tablet Take 1 tablet (5 mg total) by mouth daily.  Marland Kitchen amoxicillin (AMOXIL) 500  MG capsule Take 2,000 mg by mouth as needed. Prior to dental procedure  . aspirin 81 MG EC tablet TAKE 1 TABLET (81 MG TOTAL) BY MOUTH DAILY. SWALLOW WHOLE.  Marland Kitchen atorvastatin (LIPITOR) 80 MG tablet Take 1 tablet (80 mg total) by mouth at bedtime.  . clopidogrel (PLAVIX) 75 MG tablet Take 1 tablet (75 mg total) by mouth daily with breakfast.  . diclofenac Sodium (VOLTAREN) 1 % GEL Apply 4 g topically 4 (four) times daily as needed (Pain). Apply to bilateral hips and knees  . doxycycline (VIBRA-TABS) 100 MG tablet Take 100 mg by mouth 2 (two) times daily.  . Ensure (ENSURE) Take 237 mLs by mouth in the morning and at bedtime. Strawberry if available  . furosemide (LASIX) 80 MG tablet Take 80 mg by mouth 2 (two) times daily.  . hydrALAZINE (APRESOLINE) 50 MG tablet Take 1 tablet (50 mg total) by mouth 3 (three) times daily.  . Insulin Degludec-Liraglutide (XULTOPHY) 100-3.6 UNIT-MG/ML SOPN Inject 14 Units into the skin daily.  Marland Kitchen lactulose ([place])  10 GM/15ML solution Take 20 g by mouth 2 (two) times daily.   . metoprolol succinate (TOPROL-XL) 25 MG 24 hr tablet Take 1 tablet (25 mg total) by mouth daily.  . Multiple Vitamins-Minerals (DECUBI-VITE) CAPS Take 1 capsule by mouth daily.  . Nutritional Supplements (NUTRITIONAL SUPPLEMENT PO) Take 1 each by mouth in the morning and at bedtime. Magic Cup  . PARoxetine (PAXIL) 30 MG tablet TAKE 1 TABLET BY MOUTH DAILY  . potassium chloride SA (KLOR-CON) 20 MEQ tablet Take 20 mEq by mouth daily.   Marland Kitchen saccharomyces boulardii (FLORASTOR) 250 MG capsule Take 250 mg by mouth 2 (two) times daily.   No facility-administered encounter medications on file as of 02/03/2020.    Review of Systems  GENERAL: Poor appetite and weight loss MOUTH and THROAT: Denies oral discomfort, gingival pain or bleeding RESPIRATORY: no cough, SOB, DOE, wheezing, hemoptysis CARDIAC: No chest pain, edema or palpitations GI: No abdominal pain, diarrhea, constipation, heart burn,  nausea or vomiting GU: Denies dysuria, frequency, hematuria, incontinence, or discharge NEUROLOGICAL: Denies dizziness, syncope, numbness, or headache PSYCHIATRIC: Denies feelings of depression or anxiety. No report of hallucinations, insomnia, paranoia, or agitation   Immunization History  Administered Date(s) Administered  . Fluad Quad(high Dose 65+) 02/11/2019  . Influenza Split 11/12/2010, 11/10/2011  . Influenza Whole 11/07/2009  . Influenza,inj,Quad PF,6+ Mos 10/19/2012, 01/05/2014, 11/08/2015, 01/29/2017  . Influenza-Unspecified 12/15/2019  . Moderna SARS-COVID-2 Vaccination 03/09/2019, 04/06/2019  . Pneumococcal Conjugate-13 06/07/2014  . Pneumococcal Polysaccharide-23 12/26/1994, 03/05/2010  . Td 09/24/1993  . Tdap 11/12/2010, 07/12/2013   Pertinent  Health Maintenance Due  Topic Date Due  . FOOT EXAM  02/24/2020 (Originally 07/10/2017)  . OPHTHALMOLOGY EXAM  02/24/2020 (Originally 11/07/2016)  . LIPID PANEL  02/07/2020  . HEMOGLOBIN A1C  04/05/2020  . INFLUENZA VACCINE  Completed  . DEXA SCAN  Completed  . PNA vac Low Risk Adult  Completed   Fall Risk  01/10/2020 08/02/2019 06/16/2019 05/19/2019 05/04/2019  Falls in the past year? $RemoveBe'1 1 1 1 'FqJvFkZGI$ 0  Comment - - pt denies-but dtr stated pt had a fall in last mths pt denies falls-but dtr states pt has had a fall -  Number falls in past yr: 1 1 0 0 -  Injury with Fall? 0 0 0 0 -  Risk Factor Category  - - - - -  Risk for fall due to : History of fall(s);Impaired balance/gait Impaired mobility History of fall(s);Impaired balance/gait;Impaired mobility;Medication side effect History of fall(s);Impaired balance/gait;Impaired mobility;Medication side effect No Fall Risks  Risk for fall due to: Comment - - - - -  Follow up Falls evaluation completed;Education provided;Falls prevention discussed Falls evaluation completed Falls evaluation completed;Falls prevention discussed Falls evaluation completed Falls evaluation completed     Vitals:    02/03/20 1608  BP: 102/63  Pulse: 67  Resp: 17  Temp: (!) 97.3 F (36.3 C)  TempSrc: Oral  Weight: 145 lb (65.8 kg)  Height: $Remove'5\' 5"'bnZDdvJ$  (1.651 m)   Body mass index is 24.13 kg/m.  Physical Exam  GENERAL APPEARANCE:  In no acute distress. Normal body habitus SKIN:  Bilateral heels and right great toe wound MOUTH and THROAT: Lips are without lesions. Oral mucosa is moist and without lesions. Tongue is normal in shape, size, and color and without lesions RESPIRATORY: Breathing is even & unlabored, BS CTAB CARDIAC: RRR, no murmur,no extra heart sounds, no edema GI: Abdomen soft, normal BS, no masses, no tenderness NEUROLOGICAL: There is no tremor. Speech is clear. Alert  and oriented X 3. PSYCHIATRIC:  Affect and behavior are appropriate  Labs reviewed: Recent Labs    02/07/19 1323 02/07/19 1527 02/08/19 0556 02/09/19 0508 03/02/19 0253 05/04/19 1144 07/20/19 0301 07/21/19 0356 08/11/19 1638 08/16/19 0000 08/23/19 1701 09/20/19 0000 01/04/20 1222 01/13/20 0000 01/17/20 0000 01/25/20 0000  NA 143   < > 145   < > 140   < > 137   < > 139   < > 143   < > 139 134* 137 138  K 3.3*   < > 4.1   < > 3.7   < > 3.6   < > 4.9   < > 4.4   < > 4.5 4.6 4.3 4.7  CL 110   < > 109   < > 105   < > 98   < > 98   < > 102   < > 107 95* 103 101  CO2 23  --  27   < > 26   < > 27   < > 27   < > 25   < >  --  23* 23* 27*  GLUCOSE 203*   < > 143*   < > 115*   < > 239*   < > 171*  --  121*  --  109*  --   --   --   BUN 25*   < > 26*   < > 24*   < > 26*   < > 45*   < > 44*   < > 56* 80* 80* 64*  CREATININE 1.48*   < > 1.58*   < > 1.47*   < > 1.56*   < > 1.70*   < > 1.Audrey*   < > 2.80* 2.4* 2.0* 1.8*  CALCIUM 8.2*  --  9.0   < > 8.8*   < > 8.6*   < > 9.0   < > 9.4   < >  --  9.1 8.7 8.7  MG 1.7  --  2.3  --  1.7  --  1.8  --   --   --   --   --   --   --   --   --   PHOS 3.6  --   --   --   --   --   --   --   --   --   --   --   --   --   --   --    < > = values in this interval not displayed.    Recent Labs    02/07/19 1323 02/24/19 1020 07/17/19 2150 11/25/19 0000 12/09/19 0000  AST 13* $Remov'16 17 14  'NIFVMj$ --   ALT $Re'12 13 13 15  'mSu$ --   ALKPHOS 96 125 141* 140* 149*  BILITOT 1.0 0.6 0.8  --   --   PROT 6.5 7.8 7.6  --   --   ALBUMIN 3.1* 3.4* 3.4* 2.9*  --    Recent Labs    07/17/19 2150 07/19/19 0219 07/20/19 0723 08/02/19 0000 01/13/20 0000 01/17/20 0000 01/26/20 0000  WBC 18.9* 16.1* 9.2   < > 12.0 8.7 9.2  NEUTROABS 17.3*  --   --    < > 8.40 5.10 5.60  HGB 12.8 10.3* 10.3*   < > 13.6 11.7* 11.1*  HCT 40.0 32.2* 32.4*   < > 42 36 34*  MCV 94.8 93.9 93.9  --   --   --   --  PLT 256 184 193   < > 246 212 190   < > = values in this interval not displayed.   Lab Results  Component Value Date   TSH 1.630 02/07/2019   Lab Results  Component Value Date   HGBA1C 6.3 (H) 01/04/2020   Lab Results  Component Value Date   CHOL 99 02/07/2019   HDL 26 (L) 02/07/2019   LDLCALC 50 02/07/2019   TRIG 117 02/07/2019   CHOLHDL 3.8 02/07/2019    Assessment/Plan      Family/ staff Communication:  Discussed plan of care with resident and charge nurse.  Labs/tests ordered: None  Goals of care:   Long-term care   Durenda Age, DNP, MSN, FNP-BC [facility] [phone] (Monday-Friday 8:00 a.m. - 5:00 p.m.) [phone] (after hours)

## 2020-02-03 NOTE — Progress Notes (Signed)
Location:  [facility] Room Number: 217-A Place of Service:  SNF (31) Provider:  Durenda Age, DNP, FNP-BC  Patient Care Team: Hendricks Limes, MD as PCP - General (Internal Medicine) Dorothy Spark, MD as PCP - Cardiology (Cardiology) Constance Haw, MD as PCP - Electrophysiology (Cardiology) Medina-Vargas, Senaida Lange, NP as Nurse Practitioner (Internal Medicine) Rehab, [facility] ([place])  Extended Emergency Contact Information Primary Emergency Contact: Lieutenant Diego [place] of [place] Phone: [phone] Mobile Phone: [phone] Relation: Daughter Secondary Emergency Contact: Armstrong,[name]  [place] Phone: [phone] Relation: Daughter  Code Status:  FULL CODE  Goals of care: Advanced Directive information Advanced Directives 01/04/2020  Does Patient Have a Medical Advance Directive? No  Type of Advance Directive -  Does patient want to make changes to medical advance directive? -  Copy of [place] in Chart? -  Would patient like information on creating a medical advance directive? No - Patient declined  Pre-existing out of facility DNR order (yellow form or pink MOST form) -     Chief Complaint  Patient presents with  . Acute Visit    Patient is seen for a poor appetite.     HPI:  Pt is a 74 y.o. female seen today for an acute visit secondary to poor appetite.  She is a long-term care resident of [facility].  He has a PMH of chronic kidney disease, CAD, severe aortic stenosis, history of breast cancer, essential hypertension, dyslipidemia, stroke and chronic diastolic CHF. She was seen in her room today.  She was seen in her room today. She stated that she has poor appetite. She had a weight loss of 14.6 lbs in 2 months. She is currently taking Doxycycline 100 mg BID for osteomyelitis of right great toe. No reported  fever.   Past Medical History:  Diagnosis Date  . Acute on chronic diastolic CHF (congestive heart failure) ([place]) 06/22/2017  . Acute on chronic renal failure ([place]) 05/25/2011  . Adenocarcinoma of breast ([place]) 1996   Completed tamoxifen and had mastectomy.  . Aortic stenosis, severe    s/p aortic valve replacement with porcine valve 06/2004.  ECHO 2010 EF 81%, LVH, diastolic dysfxn, Bioprostetic aoritc valve, mild AS. ECHO 2013 EF 60%, Nl aortic artificial valve, dynamic obstruction in the outflow tract   Class IIb rec for annual TTE after 5 yrs. She had a TTE 2013    . Arthritis   . CAD (coronary artery disease) 2006   s/p CABG (5/06) w/ saphenous vein to RCA at time of AVR  . CKD (chronic kidney disease) stage 2, GFR 60-89 ml/min 06/03/2011   She is no longer taking NSAIDs.   Marland Kitchen CVA (cerebral infarction) 2006   Post-op from AVR. Presumed embolic in nature. Carotid stenosis of R 60-79%. Repeat dopplers 4/10 no R stenosis and L stenosis of 1-29%.  . Depression    Controlled on Paxil  . Diabetes mellitus 1992   Dx 04/25/1990. Now insulin dependent, started 2008. On ACEI.   . Diverticulosis 2001  . History of 2019 novel coronavirus disease (COVID-19)   . Hyperlipidemia    Mgmt with a statin  . Hypertension    Requires 4 drug tx  . Osteoporosis 2006   DEXA 10/06 : L femur T -2.8, R -2.7. Lumbar T -2.4. On bisphosphonates and  Calcium / Vit D.  . Peripheral vascular disease ([place])   . Presence of  permanent cardiac pacemaker   . Refusal of blood transfusions as patient is Jehovah's Witness   . Stroke [facility])    Past Surgical History:  Procedure Laterality Date  . ABDOMINAL HYSTERECTOMY  1987   for fibroids  . AORTIC VALVE REPLACEMENT  2006  . CHOLECYSTECTOMY    . CORONARY ARTERY BYPASS GRAFT  2006   Saphenous vein to RCA at time of AVR. Course complicated by acute respiratory failure, post-op PTX, ARI, ileus, CVA  . LOWER EXTREMITY ANGIOGRAPHY N/A 01/04/2020   Procedure: LOWER EXTREMITY  ANGIOGRAPHY - Right;  Surgeon: Cherre Robins, MD;  Location: [facility] CV LAB;  Service: Cardiovascular;  Laterality: N/A;  . MASTECTOMY Left 1995   L for adenocarcinoma  . PACEMAKER IMPLANT N/A 02/09/2019   Procedure: PACEMAKER IMPLANT;  Surgeon: Constance Haw, MD;  Location: [place] CV LAB;  Service: Cardiovascular;  Laterality: N/A;  . PERIPHERAL VASCULAR BALLOON ANGIOPLASTY  01/04/2020   Procedure: PERIPHERAL VASCULAR BALLOON ANGIOPLASTY;  Surgeon: Cherre Robins, MD;  Location: [place] CV LAB;  Service: Cardiovascular;;  Rt AT  . RIGHT HEART CATH AND CORONARY/GRAFT ANGIOGRAPHY N/A 02/09/2019   Procedure: RIGHT HEART CATH AND CORONARY/GRAFT ANGIOGRAPHY;  Surgeon: Sherren Mocha, MD;  Location: [place] CV LAB;  Service: Cardiovascular;  Laterality: N/A;  . TEE WITHOUT CARDIOVERSION N/A 07/20/2019   Procedure: TRANSESOPHAGEAL ECHOCARDIOGRAM (TEE);  Surgeon: Jerline Pain, MD;  Location: [facility] ENDOSCOPY;  Service: Cardiovascular;  Laterality: N/A;  . TRANSCATHETER AORTIC VALVE REPLACEMENT, TRANSFEMORAL N/A 03/01/2019   Procedure: TRANSCATHETER AORTIC VALVE REPLACEMENT, TRANSFEMORAL;  Surgeon: Sherren Mocha, MD;  Location: [place] CV LAB;  Service: Open Heart Surgery;  Laterality: N/A;    Allergies  Allergen Reactions  . 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," as the patient is a Jehovah's Witness    Outpatient Encounter Medications as of 02/03/2020  Medication Sig  . acetaminophen (TYLENOL) 325 MG tablet Take 650 mg by mouth every 6 (six) hours as needed. Notify MD id not relieved (Not to exceed 3000 mg ) in 24 hour per stranding order  . acetaminophen (TYLENOL) 325 MG tablet Take 650 mg by mouth in the morning.  . Amino Acids-Protein Hydrolys (FEEDING SUPPLEMENT, PRO-STAT SUGAR FREE 64,) LIQD Take 30 mLs by mouth in the morning and at bedtime.  Marland Kitchen amLODipine (NORVASC) 5 MG tablet Take 1 tablet (5 mg total) by mouth daily.  Marland Kitchen amoxicillin (AMOXIL) 500  MG capsule Take 2,000 mg by mouth as needed. Prior to dental procedure  . aspirin 81 MG EC tablet TAKE 1 TABLET (81 MG TOTAL) BY MOUTH DAILY. SWALLOW WHOLE.  Marland Kitchen atorvastatin (LIPITOR) 80 MG tablet Take 1 tablet (80 mg total) by mouth at bedtime.  . clopidogrel (PLAVIX) 75 MG tablet Take 1 tablet (75 mg total) by mouth daily with breakfast.  . diclofenac Sodium (VOLTAREN) 1 % GEL Apply 4 g topically 4 (four) times daily as needed (Pain). Apply to bilateral hips and knees  . doxycycline (VIBRA-TABS) 100 MG tablet Take 100 mg by mouth 2 (two) times daily.  . Ensure (ENSURE) Take 237 mLs by mouth in the morning and at bedtime. Strawberry if available  . furosemide (LASIX) 80 MG tablet Take 80 mg by mouth 2 (two) times daily.  . hydrALAZINE (APRESOLINE) 50 MG tablet Take 1 tablet (50 mg total) by mouth 3 (three) times daily.  . Insulin Degludec-Liraglutide (XULTOPHY) 100-3.6 UNIT-MG/ML SOPN Inject 14 Units into the skin daily.  Marland Kitchen lactulose ([place])  10 GM/15ML solution Take 20 g by mouth 2 (two) times daily.   . metoprolol succinate (TOPROL-XL) 25 MG 24 hr tablet Take 1 tablet (25 mg total) by mouth daily.  . Multiple Vitamins-Minerals (DECUBI-VITE) CAPS Take 1 capsule by mouth daily.  . Nutritional Supplements (NUTRITIONAL SUPPLEMENT PO) Take 1 each by mouth in the morning and at bedtime. Magic Cup  . PARoxetine (PAXIL) 30 MG tablet TAKE 1 TABLET BY MOUTH DAILY  . potassium chloride SA (KLOR-CON) 20 MEQ tablet Take 20 mEq by mouth daily.   Marland Kitchen saccharomyces boulardii (FLORASTOR) 250 MG capsule Take 250 mg by mouth 2 (two) times daily.   No facility-administered encounter medications on file as of 02/03/2020.    Review of Systems  GENERAL: Poor appetite and weight loss MOUTH and THROAT: Denies oral discomfort, gingival pain or bleeding RESPIRATORY: no cough, SOB, DOE, wheezing, hemoptysis CARDIAC: No chest pain, edema or palpitations GI: No abdominal pain, diarrhea, constipation, heart burn,  nausea or vomiting GU: Denies dysuria, frequency, hematuria, incontinence, or discharge NEUROLOGICAL: Denies dizziness, syncope, numbness, or headache PSYCHIATRIC: Denies feelings of depression or anxiety. No report of hallucinations, insomnia, paranoia, or agitation   Immunization History  Administered Date(s) Administered  . Fluad Quad(high Dose 65+) 02/11/2019  . Influenza Split 11/12/2010, 11/10/2011  . Influenza Whole 11/07/2009  . Influenza,inj,Quad PF,6+ Mos 10/19/2012, 01/05/2014, 11/08/2015, 01/29/2017  . Influenza-Unspecified 12/15/2019  . Moderna SARS-COVID-2 Vaccination 03/09/2019, 04/06/2019  . Pneumococcal Conjugate-13 06/07/2014  . Pneumococcal Polysaccharide-23 12/26/1994, 03/05/2010  . Td 09/24/1993  . Tdap 11/12/2010, 07/12/2013   Pertinent  Health Maintenance Due  Topic Date Due  . FOOT EXAM  02/24/2020 (Originally 07/10/2017)  . OPHTHALMOLOGY EXAM  02/24/2020 (Originally 11/07/2016)  . LIPID PANEL  02/07/2020  . HEMOGLOBIN A1C  04/05/2020  . INFLUENZA VACCINE  Completed  . DEXA SCAN  Completed  . PNA vac Low Risk Adult  Completed   Fall Risk  01/10/2020 08/02/2019 06/16/2019 05/19/2019 05/04/2019  Falls in the past year? $RemoveBe'1 1 1 1 'gHGrYMRCD$ 0  Comment - - pt denies-but dtr stated pt had a fall in last mths pt denies falls-but dtr states pt has had a fall -  Number falls in past yr: 1 1 0 0 -  Injury with Fall? 0 0 0 0 -  Risk Factor Category  - - - - -  Risk for fall due to : History of fall(s);Impaired balance/gait Impaired mobility History of fall(s);Impaired balance/gait;Impaired mobility;Medication side effect History of fall(s);Impaired balance/gait;Impaired mobility;Medication side effect No Fall Risks  Risk for fall due to: Comment - - - - -  Follow up Falls evaluation completed;Education provided;Falls prevention discussed Falls evaluation completed Falls evaluation completed;Falls prevention discussed Falls evaluation completed Falls evaluation completed     Vitals:    02/03/20 1608  BP: 102/63  Pulse: 67  Resp: 17  Temp: (!) 97.3 F (36.3 C)  TempSrc: Oral  Weight: 145 lb (65.8 kg)  Height: $Remove'5\' 5"'EZTHhAM$  (1.651 m)   Body mass index is 24.13 kg/m.  Physical Exam  GENERAL APPEARANCE:  In no acute distress. Normal body habitus SKIN:  Bilateral heels and right great toe wound MOUTH and THROAT: Lips are without lesions. Oral mucosa is moist and without lesions. Tongue is normal in shape, size, and color and without lesions RESPIRATORY: Breathing is even & unlabored, BS CTAB CARDIAC: RRR, no murmur,no extra heart sounds, no edema GI: Abdomen soft, normal BS, no masses, no tenderness NEUROLOGICAL: There is no tremor. Speech is clear. Alert  and oriented X 3. PSYCHIATRIC:  Affect and behavior are appropriate  Labs reviewed: Recent Labs    02/07/19 1323 02/07/19 1527 02/08/19 0556 02/09/19 0508 03/02/19 0253 05/04/19 1144 07/20/19 0301 07/21/19 0356 08/11/19 1638 08/16/19 0000 08/23/19 1701 09/20/19 0000 01/04/20 1222 01/13/20 0000 01/17/20 0000 01/25/20 0000  NA 143   < > 145   < > 140   < > 137   < > 139   < > 143   < > 139 134* 137 138  K 3.3*   < > 4.1   < > 3.7   < > 3.6   < > 4.9   < > 4.4   < > 4.5 4.6 4.3 4.7  CL 110   < > 109   < > 105   < > 98   < > 98   < > 102   < > 107 95* 103 101  CO2 23  --  27   < > 26   < > 27   < > 27   < > 25   < >  --  23* 23* 27*  GLUCOSE 203*   < > 143*   < > 115*   < > 239*   < > 171*  --  121*  --  109*  --   --   --   BUN 25*   < > 26*   < > 24*   < > 26*   < > 45*   < > 44*   < > 56* 80* 80* 64*  CREATININE 1.48*   < > 1.58*   < > 1.47*   < > 1.56*   < > 1.70*   < > 1.62*   < > 2.80* 2.4* 2.0* 1.8*  CALCIUM 8.2*  --  9.0   < > 8.8*   < > 8.6*   < > 9.0   < > 9.4   < >  --  9.1 8.7 8.7  MG 1.7  --  2.3  --  1.7  --  1.8  --   --   --   --   --   --   --   --   --   PHOS 3.6  --   --   --   --   --   --   --   --   --   --   --   --   --   --   --    < > = values in this interval not displayed.    Recent Labs    02/07/19 1323 02/24/19 1020 07/17/19 2150 11/25/19 0000 12/09/19 0000  AST 13* $Remov'16 17 14  'jhCnjx$ --   ALT $Re'12 13 13 15  'KpH$ --   ALKPHOS 96 125 141* 140* 149*  BILITOT 1.0 0.6 0.8  --   --   PROT 6.5 7.8 7.6  --   --   ALBUMIN 3.1* 3.4* 3.4* 2.9*  --    Recent Labs    07/17/19 2150 07/19/19 0219 07/20/19 0723 08/02/19 0000 01/13/20 0000 01/17/20 0000 01/26/20 0000  WBC 18.9* 16.1* 9.2   < > 12.0 8.7 9.2  NEUTROABS 17.3*  --   --    < > 8.40 5.10 5.60  HGB 12.8 10.3* 10.3*   < > 13.6 11.7* 11.1*  HCT 40.0 32.2* 32.4*   < > 42 36 34*  MCV 94.8 93.9 93.9  --   --   --   --  PLT 256 184 193   < > 246 212 190   < > = values in this interval not displayed.   Lab Results  Component Value Date   TSH 1.630 02/07/2019   Lab Results  Component Value Date   HGBA1C 6.3 (H) 01/04/2020   Lab Results  Component Value Date   CHOL 99 02/07/2019   HDL 26 (L) 02/07/2019   LDLCALC 50 02/07/2019   TRIG 117 02/07/2019   CHOLHDL 3.8 02/07/2019    Assessment/Plan  1. Weight loss Wt Readings from Last 3 Encounters:  02/03/20 145 lb (65.8 kg)  01/27/20 145 lb 12.8 oz (66.1 kg)  01/18/20 159 lb 9.6 oz (72.4 kg)   -  Will increase  Magic cup from BID to TID and continue Ensure BID for supplementation  2. Osteomyelitis of great toe of right foot (HCC) -  Continue doxycycline and wound treatment  3. Type 2 diabetes mellitus with diabetic neuropathy, with long-term current use of insulin (HCC) Lab Results  Component Value Date   HGBA1C 6.3 (H) 01/04/2020   -stable, Continue Xultophy and CBG checks     Family/ staff Communication:  Discussed plan of care with resident and charge nurse.  Labs/tests ordered: None  Goals of care:   Long-term care   Durenda Age, DNP, MSN, FNP-BC [facility] [phone] (Monday-Friday 8:00 a.m. - 5:00 p.m.) [phone] (after hours)

## 2020-02-05 LAB — WOUND CULTURE
MICRO NUMBER:: 11299232
SPECIMEN QUALITY:: ADEQUATE

## 2020-02-05 LAB — HOUSE ACCOUNT TRACKING

## 2020-02-07 ENCOUNTER — Encounter: Payer: Self-pay | Admitting: Cardiology

## 2020-02-07 ENCOUNTER — Other Ambulatory Visit: Payer: Self-pay

## 2020-02-07 ENCOUNTER — Encounter (HOSPITAL_COMMUNITY): Payer: Medicare Other

## 2020-02-07 ENCOUNTER — Ambulatory Visit (INDEPENDENT_AMBULATORY_CARE_PROVIDER_SITE_OTHER): Payer: Medicare Other | Admitting: Cardiology

## 2020-02-07 ENCOUNTER — Encounter: Payer: Medicare Other | Admitting: Vascular Surgery

## 2020-02-07 VITALS — BP 110/60 | HR 70 | Ht 65.0 in | Wt 148.0 lb

## 2020-02-07 DIAGNOSIS — I442 Atrioventricular block, complete: Secondary | ICD-10-CM

## 2020-02-07 DIAGNOSIS — I7025 Atherosclerosis of native arteries of other extremities with ulceration: Secondary | ICD-10-CM | POA: Diagnosis not present

## 2020-02-07 NOTE — Progress Notes (Signed)
Electrophysiology Office Note   Date:  02/07/2020   ID:  [name], [name] 1945-08-18, MRN [id number]  PCP:  Pecola Lawless, MD  Cardiologist:  Delton See Primary Electrophysiologist:  Kirstina Leinweber Jorja Loa, MD    Chief Complaint: Pacemaker    History of Present Illness: [name] is a 74 y.o. female who is being seen today for the evaluation of pacemaker at the request of Burns Spain, MD. Presenting today for electrophysiology evaluation.  She has a history of coronary artery disease status post CABG with AVR in 2006 complicated by CVA, CKD stage II, diabetes, hypertension, hyperlipidemia, diastolic heart failure.  She is status post TAVR 03/01/2019.  She presented to the hospital December 2020 with acute on chronic heart failure in the setting of complete heart block.  She has now status post Medtronic dual-chamber pacemaker implanted 02/09/2020.  She presented to [facility] May 2021 with sepsis and was found to have vegetations on her pacemaker leads.  She was sent to [facility] for explant and is now status post Medtronic Micra AV.  Today, denies symptoms of palpitations, chest pain, shortness of breath, orthopnea, PND, lower extremity edema, claudication, dizziness, presyncope, syncope, bleeding, or neurologic sequela. The patient is tolerating medications without difficulties.  Her pacemaker is functioning appropriately today.  She feels that she is not having any issues with it.  She does not have weakness or fatigue.  He does not appear that she has pacemaker syndrome.  She is having an issue with her right foot.  She apparently has ulcerations from her diabetes.  She is getting wound care.   Past Medical History:  Diagnosis Date  . Acute on chronic diastolic CHF (congestive heart failure) (HCC) 06/22/2017  . Acute on chronic renal failure (HCC) 05/25/2011  . Adenocarcinoma of breast (HCC) 1996   Completed tamoxifen and had mastectomy.  . Aortic  stenosis, severe    s/p aortic valve replacement with porcine valve 06/2004.  ECHO 2010 EF 65%, LVH, diastolic dysfxn, Bioprostetic aoritc valve, mild AS. ECHO 2013 EF 60%, Nl aortic artificial valve, dynamic obstruction in the outflow tract   Class IIb rec for annual TTE after 5 yrs. She had a TTE 2013    . Arthritis   . CAD (coronary artery disease) 2006   s/p CABG (5/06) w/ saphenous vein to RCA at time of AVR  . CKD (chronic kidney disease) stage 2, GFR 60-89 ml/min 06/03/2011   She is no longer taking NSAIDs.   Marland Kitchen CVA (cerebral infarction) 2006   Post-op from AVR. Presumed embolic in nature. Carotid stenosis of R 60-79%. Repeat dopplers 4/10 no R stenosis and L stenosis of 1-29%.  . Depression    Controlled on Paxil  . Diabetes mellitus 1992   Dx 04/25/1990. Now insulin dependent, started 2008. On ACEI.   . Diverticulosis 2001  . History of 2019 novel coronavirus disease (COVID-19)   . Hyperlipidemia    Mgmt with a statin  . Hypertension    Requires 4 drug tx  . Osteoporosis 2006   DEXA 10/06 : L femur T -2.8, R -2.7. Lumbar T -2.4. On bisphosphonates and  Calcium / Vit D.  . Peripheral vascular disease (HCC)   . Presence of permanent cardiac pacemaker   . Refusal of blood transfusions as patient is Jehovah's Witness   . Stroke [facility])    Past Surgical History:  Procedure Laterality Date  . ABDOMINAL HYSTERECTOMY  1987   for fibroids  .  AORTIC VALVE REPLACEMENT  2006  . CHOLECYSTECTOMY    . CORONARY ARTERY BYPASS GRAFT  2006   Saphenous vein to RCA at time of AVR. Course complicated by acute respiratory failure, post-op PTX, ARI, ileus, CVA  . LOWER EXTREMITY ANGIOGRAPHY N/A 01/04/2020   Procedure: LOWER EXTREMITY ANGIOGRAPHY - Right;  Surgeon: Cherre Robins, MD;  Location: [place] CV LAB;  Service: Cardiovascular;  Laterality: N/A;  . MASTECTOMY Left 1995   L for adenocarcinoma  . PACEMAKER IMPLANT N/A 02/09/2019   Procedure: PACEMAKER IMPLANT;  Surgeon: Constance Haw, MD;  Location: [place] [facility];  Service: Cardiovascular;  Laterality: N/A;  . PERIPHERAL VASCULAR BALLOON ANGIOPLASTY  01/04/2020   Procedure: PERIPHERAL VASCULAR BALLOON ANGIOPLASTY;  Surgeon: Cherre Robins, MD;  Location: [place] [facility];  Service: Cardiovascular;;  Rt AT  . RIGHT HEART CATH AND CORONARY/GRAFT ANGIOGRAPHY N/A 02/09/2019   Procedure: RIGHT HEART CATH AND CORONARY/GRAFT ANGIOGRAPHY;  Surgeon: Sherren Mocha, MD;  Location: [place] [facility];  Service: Cardiovascular;  Laterality: N/A;  . TEE WITHOUT CARDIOVERSION N/A 07/20/2019   Procedure: TRANSESOPHAGEAL ECHOCARDIOGRAM (TEE);  Surgeon: Jerline Pain, MD;  Location: [facility] ENDOSCOPY;  Service: Cardiovascular;  Laterality: N/A;  . TRANSCATHETER AORTIC VALVE REPLACEMENT, TRANSFEMORAL N/A 03/01/2019   Procedure: TRANSCATHETER AORTIC VALVE REPLACEMENT, TRANSFEMORAL;  Surgeon: Sherren Mocha, MD;  Location: [place] [facility];  Service: Open Heart Surgery;  Laterality: N/A;     Current Outpatient Medications  Medication Sig Dispense Refill  . acetaminophen (TYLENOL) 325 MG tablet Take 650 mg by mouth every 6 (six) hours as needed. Notify MD id not relieved (Not to exceed 3000 mg ) in 24 hour per stranding order    . acetaminophen (TYLENOL) 325 MG tablet Take 650 mg by mouth in the morning.    . Amino Acids-Protein Hydrolys (FEEDING SUPPLEMENT, PRO-STAT SUGAR FREE 64,) LIQD Take 30 mLs by mouth in the morning and at bedtime.    Marland Kitchen amLODipine (NORVASC) 5 MG tablet Take 1 tablet (5 mg total) by mouth daily. 90 tablet 3  . amoxicillin (AMOXIL) 500 MG capsule Take 2,000 mg by mouth as needed. Prior to dental procedure    . aspirin 81 MG EC tablet TAKE 1 TABLET (81 MG TOTAL) BY MOUTH DAILY. SWALLOW WHOLE. 30 tablet 3  . atorvastatin (LIPITOR) 80 MG tablet Take 1 tablet (80 mg total) by mouth at bedtime. 90 tablet 3  . clopidogrel (PLAVIX) 75 MG tablet Take 1 tablet (75 mg total) by mouth daily with breakfast. 90 tablet 3  .  diclofenac Sodium (VOLTAREN) 1 % GEL Apply 4 g topically 4 (four) times daily as needed (Pain). Apply to bilateral hips and knees    . doxycycline (VIBRA-TABS) 100 MG tablet Take 100 mg by mouth 2 (two) times daily.    . Ensure (ENSURE) Take 237 mLs by mouth in the morning and at bedtime. Strawberry if available    . furosemide (LASIX) 80 MG tablet Take 80 mg by mouth 2 (two) times daily.    . hydrALAZINE (APRESOLINE) 50 MG tablet Take 1 tablet (50 mg total) by mouth 3 (three) times daily. 270 tablet 0  . Insulin Degludec-Liraglutide (XULTOPHY) 100-3.6 UNIT-MG/ML SOPN Inject 14 Units into the skin daily.    Marland Kitchen lactulose (CHRONULAC) 10 GM/15ML solution Take 20 g by mouth 2 (two) times daily.     . metoprolol succinate (TOPROL-XL) 25 MG 24 hr tablet Take 1 tablet (25 mg total) by mouth daily. [place]  tablet 1  . Multiple Vitamins-Minerals (DECUBI-VITE) CAPS Take 1 capsule by mouth daily.    . Nutritional Supplements (NUTRITIONAL SUPPLEMENT PO) Take 1 each by mouth in the morning and at bedtime. Magic Cup    . PARoxetine (PAXIL) 30 MG tablet TAKE 1 TABLET BY MOUTH DAILY 30 tablet 11  . potassium chloride SA (KLOR-CON) 20 MEQ tablet Take 20 mEq by mouth daily.     Marland Kitchen saccharomyces boulardii (FLORASTOR) 250 MG capsule Take 250 mg by mouth 2 (two) times daily.     No current facility-administered medications for this visit.    Allergies:   Other   Social History:  The patient  reports that she has never smoked. She has never used smokeless tobacco. She reports current alcohol use. She reports that she does not use drugs.   Family History:  The patient's family history includes Alzheimer's disease in her mother; Diabetes in her mother; Heart disease (age of onset: 39) in her sister; Heart disease (age of onset: 20) in her father; Hypertension in her mother; Kidney disease in her sister; Mental illness in her sister.    ROS:  Please see the history of present illness.   Otherwise, review of systems is  positive for none.   All other systems are reviewed and negative.   PHYSICAL EXAM: VS:  BP 110/60   Pulse 70   Ht $R'5\' 5"'de$  (1.651 m)   Wt 148 lb (67.1 kg)   SpO2 99%   BMI 24.63 kg/m  , BMI Body mass index is 24.63 kg/m. GEN: Well nourished, well developed, in no acute distress  HEENT: normal  Neck: no JVD, carotid bruits, or masses Cardiac: RRR; no murmurs, rubs, or gallops,no edema  Respiratory:  clear to auscultation bilaterally, normal work of breathing GI: soft, nontender, nondistended, + BS MS: no deformity or atrophy  Skin: warm and dry Neuro:  Strength and sensation are intact Psych: euthymic mood, full affect  EKG:  EKG is not ordered today. Personal review of the ekg ordered 08/23/19 shows sinus rhythm, ventricular paced  Personal review of the device interrogation today. Results in [facility]: 02/07/2019: TSH 1.630 07/17/2019: B Natriuretic Peptide 424.2 07/20/2019: Magnesium 1.8 08/23/2019: NT-Pro BNP 3,638 11/25/2019: ALT 15 01/25/2020: BUN 64; Creatinine 1.8; Potassium 4.7; Sodium 138 01/26/2020: Hemoglobin 11.1; Platelets 190    Lipid Panel     Component Value Date/Time   CHOL 99 02/07/2019 2012   CHOL 148 05/25/2015 1106   TRIG 117 02/07/2019 2012   HDL 26 (L) 02/07/2019 2012   HDL 41 05/25/2015 1106   CHOLHDL 3.8 02/07/2019 2012   VLDL 23 02/07/2019 2012   LDLCALC 50 02/07/2019 2012   LDLCALC 79 05/25/2015 1106     Wt Readings from Last 3 Encounters:  02/07/20 148 lb (67.1 kg)  02/03/20 145 lb (65.8 kg)  01/27/20 145 lb 12.8 oz (66.1 kg)      Other studies Reviewed: Additional studies/ records that were reviewed today include: TTE 04/23/18  Review of the above records today demonstrates:  1. Left ventricular ejection fraction, by estimation, is 65 to 70%. The  left ventricle has hyperdynamic function. The left ventricle has no  regional wall motion abnormalities. There is severe concentric left  ventricular hypertrophy. Left  ventricular  diastolic parameters are consistent with Grade I diastolic dysfunction  (impaired relaxation). Elevated left ventricular end-diastolic pressure.  2. Right ventricular systolic function is normal. The right ventricular  size is normal. There is  normal pulmonary artery systolic pressure.  3. Left atrial size was moderately dilated.  4. Right atrial size was mildly dilated.  5. The mitral valve is degenerative. Trivial mitral valve regurgitation.  Moderate mitral stenosis. The mean mitral valve gradient is 9.0 mmHg.  6. The aortic valve has been repaired/replaced. There is a 23 mm  Medtronic CoreValve-Evolut Pro prosthetic (TAVR) valve present in the  aortic position. Procedure Date: 03/01/2019. Aortic valve area, by VTI  measures 1.21 cm. Aortic valve mean gradient  measures 12.0 mmHg. Trivial paravalvular regurgitation. No definite  valvular regurgitation.  7. The inferior vena cava is normal in size with greater than 50%  respiratory variability, suggesting right atrial pressure of 3 mmHg.    ASSESSMENT AND PLAN:  1.  Complete heart block: Status post Medtronic dual-chamber pacemaker implanted December 2020.  She had sepsis with vegetations on her leads.  She was referred to [facility] and is now status post Micra AV.  2.  Severe aortic stenosis: Status post TAVR January 2021.  No issues on recent echo.  Continue to monitor.    3.  Hypertension: Currently well controlled  4.  Coronary artery disease: Status post CABG x1 with SVG to the RCA.  No current chest pain.    Current medicines are reviewed at length with the patient today.   The patient does not have concerns regarding her medicines.  The following changes were made today: none  Labs/ tests ordered today include:  No orders of the defined types were placed in this encounter.    Disposition:   FU with Wanda Cellucci 12 months  Signed, Shametra Cumberland Meredith Leeds, MD  02/07/2020 2:29 PM     [place] [address] [place] [address] [phone] (office) [phone] (fax)

## 2020-02-07 NOTE — Patient Instructions (Signed)
Medication Instructions:  Your physician recommends that you continue on your current medications as directed. Please refer to the Current Medication list given to you today.  *If you need a refill on your cardiac medications before your next appointment, please call your pharmacy*   Lab Work: None ordered   Testing/Procedures: None ordered   Follow-Up: At [facility], you and your health needs are our priority.  As part of our continuing mission to provide you with exceptional heart care, we have created designated Provider Care Teams.  These Care Teams include your primary Cardiologist (physician) and Advanced Practice Providers (APPs -  Physician Assistants and Nurse Practitioners) who all work together to provide you with the care you need, when you need it.  We recommend signing up for the patient portal called "MyChart".  Sign up information is provided on this After Visit Summary.  MyChart is used to connect with patients for Virtual Visits (Telemedicine).  Patients are able to view lab/test results, encounter notes, upcoming appointments, etc.  Non-urgent messages can be sent to your provider as well.   To learn more about what you can do with MyChart, go to [email or web address].    Remote monitoring is used to monitor your Pacemaker or ICD from home. This monitoring reduces the number of office visits required to check your device to one time per year. It allows [place] to keep an eye on the functioning of your device to ensure it is working properly. You are scheduled for a device check from home on 04/23/20. You may send your transmission at any time that day. If you have a wireless device, the transmission will be sent automatically. After your physician reviews your transmission, you will receive a postcard with your next transmission date.  Your next appointment:   1 year(s)  The format for your next appointment:   In Person  Provider:   Allegra Lai, MD   Thank you for  choosing [facility]!!   Trinidad Curet, RN [phone]

## 2020-02-08 ENCOUNTER — Encounter: Payer: Self-pay | Admitting: Adult Health

## 2020-02-08 ENCOUNTER — Non-Acute Institutional Stay (SKILLED_NURSING_FACILITY): Payer: Medicare Other | Admitting: Adult Health

## 2020-02-08 ENCOUNTER — Emergency Department (HOSPITAL_COMMUNITY): Payer: Medicare Other

## 2020-02-08 ENCOUNTER — Ambulatory Visit: Payer: Medicare Other | Admitting: Cardiology

## 2020-02-08 ENCOUNTER — Encounter (HOSPITAL_COMMUNITY): Payer: Self-pay

## 2020-02-08 ENCOUNTER — Other Ambulatory Visit (HOSPITAL_COMMUNITY): Payer: Medicare Other

## 2020-02-08 ENCOUNTER — Emergency Department (HOSPITAL_COMMUNITY)
Admission: EM | Admit: 2020-02-08 | Discharge: 2020-02-09 | Disposition: A | Discharge status: Home / Self Care | Payer: Medicare Other | Attending: Emergency Medicine | Admitting: Emergency Medicine

## 2020-02-08 ENCOUNTER — Other Ambulatory Visit: Payer: Self-pay

## 2020-02-08 DIAGNOSIS — Z79899 Other long term (current) drug therapy: Secondary | ICD-10-CM | POA: Diagnosis not present

## 2020-02-08 DIAGNOSIS — R630 Anorexia: Secondary | ICD-10-CM

## 2020-02-08 DIAGNOSIS — Z7982 Long term (current) use of aspirin: Secondary | ICD-10-CM | POA: Insufficient documentation

## 2020-02-08 DIAGNOSIS — I2510 Atherosclerotic heart disease of native coronary artery without angina pectoris: Secondary | ICD-10-CM | POA: Diagnosis not present

## 2020-02-08 DIAGNOSIS — I5033 Acute on chronic diastolic (congestive) heart failure: Secondary | ICD-10-CM | POA: Diagnosis not present

## 2020-02-08 DIAGNOSIS — N1832 Chronic kidney disease, stage 3b: Secondary | ICD-10-CM

## 2020-02-08 DIAGNOSIS — Z8616 Personal history of COVID-19: Secondary | ICD-10-CM | POA: Insufficient documentation

## 2020-02-08 DIAGNOSIS — Z20822 Contact with and (suspected) exposure to covid-19: Secondary | ICD-10-CM | POA: Diagnosis not present

## 2020-02-08 DIAGNOSIS — Z794 Long term (current) use of insulin: Secondary | ICD-10-CM | POA: Insufficient documentation

## 2020-02-08 DIAGNOSIS — Z7902 Long term (current) use of antithrombotics/antiplatelets: Secondary | ICD-10-CM | POA: Insufficient documentation

## 2020-02-08 DIAGNOSIS — E1122 Type 2 diabetes mellitus with diabetic chronic kidney disease: Secondary | ICD-10-CM | POA: Diagnosis not present

## 2020-02-08 DIAGNOSIS — Z950 Presence of cardiac pacemaker: Secondary | ICD-10-CM | POA: Insufficient documentation

## 2020-02-08 DIAGNOSIS — I130 Hypertensive heart and chronic kidney disease with heart failure and stage 1 through stage 4 chronic kidney disease, or unspecified chronic kidney disease: Secondary | ICD-10-CM | POA: Diagnosis not present

## 2020-02-08 DIAGNOSIS — Z853 Personal history of malignant neoplasm of breast: Secondary | ICD-10-CM | POA: Diagnosis not present

## 2020-02-08 DIAGNOSIS — M869 Osteomyelitis, unspecified: Secondary | ICD-10-CM | POA: Diagnosis not present

## 2020-02-08 DIAGNOSIS — Z951 Presence of aortocoronary bypass graft: Secondary | ICD-10-CM | POA: Insufficient documentation

## 2020-02-08 LAB — CBC WITH DIFFERENTIAL/PLATELET
Abs Immature Granulocytes: 0.06 10*3/uL (ref 0.00–0.07)
Basophils Absolute: 0 10*3/uL (ref 0.0–0.1)
Basophils Relative: 0 %
Eosinophils Absolute: 0.3 10*3/uL (ref 0.0–0.5)
Eosinophils Relative: 3 %
HCT: 40.8 % (ref 36.0–46.0)
Hemoglobin: 13.2 g/dL (ref 12.0–15.0)
Immature Granulocytes: 1 %
Lymphocytes Relative: 25 %
Lymphs Abs: 2.3 10*3/uL (ref 0.7–4.0)
MCH: 30.1 pg (ref 26.0–34.0)
MCHC: 32.4 g/dL (ref 30.0–36.0)
MCV: 92.9 fL (ref 80.0–100.0)
Monocytes Absolute: 1.1 10*3/uL — ABNORMAL HIGH (ref 0.1–1.0)
Monocytes Relative: 12 %
Neutro Abs: 5.6 10*3/uL (ref 1.7–7.7)
Neutrophils Relative %: 59 %
Platelets: 340 10*3/uL (ref 150–400)
RBC: 4.39 MIL/uL (ref 3.87–5.11)
RDW: 15.4 % (ref 11.5–15.5)
WBC: 9.4 10*3/uL (ref 4.0–10.5)
nRBC: 0.2 % (ref 0.0–0.2)

## 2020-02-08 LAB — RESP PANEL BY RT-PCR (FLU A&B, COVID) ARPGX2
Influenza A by PCR: NEGATIVE
Influenza B by PCR: NEGATIVE
SARS Coronavirus 2 by RT PCR: NEGATIVE

## 2020-02-08 LAB — COMPREHENSIVE METABOLIC PANEL
ALT: 43 U/L (ref 0–44)
AST: 34 U/L (ref 15–41)
Albumin: 2.4 g/dL — ABNORMAL LOW (ref 3.5–5.0)
Alkaline Phosphatase: 161 U/L — ABNORMAL HIGH (ref 38–126)
Anion gap: 12 (ref 5–15)
BUN: 115 mg/dL — ABNORMAL HIGH (ref 8–23)
CO2: 23 mmol/L (ref 22–32)
Calcium: 9.3 mg/dL (ref 8.9–10.3)
Chloride: 104 mmol/L (ref 98–111)
Creatinine, Ser: 2.17 mg/dL — ABNORMAL HIGH (ref 0.44–1.00)
GFR, Estimated: 23 mL/min — ABNORMAL LOW (ref >60)
Glucose, Bld: 81 mg/dL (ref 70–99)
Potassium: 4.5 mmol/L (ref 3.5–5.1)
Sodium: 139 mmol/L (ref 135–145)
Total Bilirubin: 0.6 mg/dL (ref 0.3–1.2)
Total Protein: 7 g/dL (ref 6.5–8.1)

## 2020-02-08 LAB — TSH: TSH: 4.03 u[IU]/mL (ref 0.350–4.500)

## 2020-02-08 NOTE — ED Notes (Signed)
Patient's daughter, [name], going home for the night, please call her with any updates.

## 2020-02-08 NOTE — Discharge Instructions (Signed)
Please talk to your doctor about turning in appetite stimulant. Please call to follow-up with nephrology. Get rechecked if you develop fevers, severe abdominal pain, or new concerning symptoms.

## 2020-02-08 NOTE — ED Notes (Addendum)
Attempted report to [facility]. RN unavailable. Left message with Damian Leavell, CNA to have RN call [phone] for discharge instructions

## 2020-02-08 NOTE — ED Notes (Signed)
Called [facility] to transport to [facility]

## 2020-02-08 NOTE — Addendum Note (Signed)
Addended by: Cranford Mon R on: 02/08/2020 10:48 AM   Modules accepted: Orders

## 2020-02-08 NOTE — Addendum Note (Signed)
Addended by: Cranford Mon R on: 02/08/2020 10:46 AM   Modules accepted: Orders

## 2020-02-08 NOTE — ED Triage Notes (Signed)
Pt BIB [facility] from [facility]. Daughter requested pt to be sent over here for evaluation d/t not eating x1 month since foot surgery. Daughter reports pt only drinks Ensures since surgery. Pt a/o x4 with no complaints. Pt states she has no taste which is why she has not eaten anything. Pt had a negative covid Monday   VSS  BP 130/70 HR 74 RR 18 97% RA  CBG 97

## 2020-02-08 NOTE — ED Provider Notes (Signed)
[facility] EMERGENCY DEPARTMENT Provider Note   CSN: [id number] Arrival date & time: 02/08/20  1622     History Chief Complaint  Patient presents with  . Failure To Thrive    KIMBRA MARCELINO is a 74 y.o. female.  The history is provided by the patient and medical records.   EDELYN HEIDEL is a 74 y.o. female who presents to the Emergency Department complaining of poor appetite. She presents the emergency department by EMS from [facility] rehabilitation for evaluation of one month of poor appetite. She states that she just does not have this desire to eat. She is drinking juices and ensure without difficulty. She denies any fevers, chest pain, shortness of breath, abdominal pain, nausea, vomiting. She currently does not ambulate due to chronic right foot and leg pain. She has a history of peripheral vascular disease and is status post angiography of the lower extremities on November 10. She is on chronic antibiotics for osteomyelitis of the foot. Symptoms are moderate and constant nature. She has been vaccinated for COVID-19.    Past Medical History:  Diagnosis Date  . Acute on chronic diastolic CHF (congestive heart failure) ([place]) 06/22/2017  . Acute on chronic renal failure ([place]) 05/25/2011  . Adenocarcinoma of breast ([place]) 1996   Completed tamoxifen and had mastectomy.  . Aortic stenosis, severe    s/p aortic valve replacement with porcine valve 06/2004.  ECHO 2010 EF 97%, LVH, diastolic dysfxn, Bioprostetic aoritc valve, mild AS. ECHO 2013 EF 60%, Nl aortic artificial valve, dynamic obstruction in the outflow tract   Class IIb rec for annual TTE after 5 yrs. She had a TTE 2013    . Arthritis   . CAD (coronary artery disease) 2006   s/p CABG (5/06) w/ saphenous vein to RCA at time of AVR  . CKD (chronic kidney disease) stage 2, GFR 60-89 ml/min 06/03/2011   She is no longer taking NSAIDs.   Marland Kitchen CVA (cerebral infarction) 2006   Post-op from AVR. Presumed  embolic in nature. Carotid stenosis of R 60-79%. Repeat dopplers 4/10 no R stenosis and L stenosis of 1-29%.  . Depression    Controlled on Paxil  . Diabetes mellitus 1992   Dx 04/25/1990. Now insulin dependent, started 2008. On ACEI.   . Diverticulosis 2001  . History of 2019 novel coronavirus disease (COVID-19)   . Hyperlipidemia    Mgmt with a statin  . Hypertension    Requires 4 drug tx  . Osteoporosis 2006   DEXA 10/06 : L femur T -2.8, R -2.7. Lumbar T -2.4. On bisphosphonates and  Calcium / Vit D.  . Peripheral vascular disease ([place])   . Presence of permanent cardiac pacemaker   . Refusal of blood transfusions as patient is Jehovah's Witness   . Stroke [facility])     Patient Active Problem List   Diagnosis Date Noted  . Bleeding 01/27/2020  . CKD (chronic kidney disease) stage 3, GFR 30-59 ml/min (HCC) 01/27/2020  . Critical lower limb ischemia ([place]) 01/04/2020  . Skin pustule 10/20/2019  . Neurocognitive deficits 08/04/2019  . Controlled type 2 diabetes mellitus without complication ([place]) 41/63/8453  . CVA (cerebral vascular accident) ([place]) 07/23/2019  . Infection of pacemaker lead wire ([place]) 07/21/2019  . Fever   . History of transcatheter aortic valve replacement (TAVR)   . Septic encephalopathy 07/18/2019  . Sacral decubitus ulcer, stage II ([place]) 07/18/2019  . Bacteremia due to group B Streptococcus 07/18/2019  .  Sepsis ([place]) 07/18/2019  . Complete heart block ([place])   . Chronic diastolic congestive heart failure ([place]) 02/07/2019  . AKI (acute kidney injury) ([place]) 07/22/2017  . Peripheral arterial disease ([place]) 06/18/2017  . B12 deficiency 05/01/2017  . Bilateral lower extremity edema 11/08/2015  . Aortic atherosclerosis ([place]) 06/07/2014  . Abnormality of gait 05/29/2012  . Constipation 03/16/2012  . HOCM (hypertrophic obstructive cardiomyopathy) ([place]) 06/11/2011  . Routine health maintenance 06/10/2010  . Hyperlipidemia   . Refusal of blood transfusions as patient  is Jehovah's Witness   . History of adenocarcinoma of breast   . Osteoporosis   . Status post CVA   . Aortic stenosis s/p Tissure AVR 2006   . CAD (coronary artery disease)   . Hypertension   . Depression 01/23/2006  . DM (diabetes mellitus) ([place]) 03/25/1990  . Benign hypertensive heart and kidney disease with diastolic CHF, NYHA class 3 and CKD stage 3 ([place]) 1992  . Bradycardia 1992    Past Surgical History:  Procedure Laterality Date  . ABDOMINAL HYSTERECTOMY  1987   for fibroids  . AORTIC VALVE REPLACEMENT  2006  . CHOLECYSTECTOMY    . CORONARY ARTERY BYPASS GRAFT  2006   Saphenous vein to RCA at time of AVR. Course complicated by acute respiratory failure, post-op PTX, ARI, ileus, CVA  . LOWER EXTREMITY ANGIOGRAPHY N/A 01/04/2020   Procedure: LOWER EXTREMITY ANGIOGRAPHY - Right;  Surgeon: Cherre Robins, MD;  Location: [facility] LAB;  Service: Cardiovascular;  Laterality: N/A;  . MASTECTOMY Left 1995   L for adenocarcinoma  . PACEMAKER IMPLANT N/A 02/09/2019   Procedure: PACEMAKER IMPLANT;  Surgeon: Constance Haw, MD;  Location: [facility] LAB;  Service: Cardiovascular;  Laterality: N/A;  . PERIPHERAL VASCULAR BALLOON ANGIOPLASTY  01/04/2020   Procedure: PERIPHERAL VASCULAR BALLOON ANGIOPLASTY;  Surgeon: Cherre Robins, MD;  Location: [place] LAB;  Service: Cardiovascular;;  Rt AT  . RIGHT HEART CATH AND CORONARY/GRAFT ANGIOGRAPHY N/A 02/09/2019   Procedure: RIGHT HEART CATH AND CORONARY/GRAFT ANGIOGRAPHY;  Surgeon: Sherren Mocha, MD;  Location: [place] [facility] LAB;  Service: Cardiovascular;  Laterality: N/A;  . TEE WITHOUT CARDIOVERSION N/A 07/20/2019   Procedure: TRANSESOPHAGEAL ECHOCARDIOGRAM (TEE);  Surgeon: Jerline Pain, MD;  Location[facility] ENDOSCOPY;  Service: Cardiovascular;  Laterality: N/A;  . TRANSCATHETER AORTIC VALVE REPLACEMENT, TRANSFEMORAL N/A 03/01/2019   Procedure: TRANSCATHETER AORTIC VALVE REPLACEMENT, TRANSFEMORAL;  Surgeon: Sherren Mocha, MD;  Location: [place] CV LAB;  Service: Open Heart Surgery;  Laterality: N/A;     OB History   No obstetric history on file.     Family History  Problem Relation Age of Onset  . Diabetes Mother   . Hypertension Mother   . Alzheimer's disease Mother   . Heart disease Father 55       AMI at age 16 and 66  . Mental illness Sister   . Heart disease Sister 104       AMI  . Kidney disease Sister     Social History   Tobacco Use  . Smoking status: Never Smoker  . Smokeless tobacco: Never Used  Vaping Use  . Vaping Use: Never used  Substance Use Topics  . Alcohol use: Yes    Alcohol/week: 0.0 standard drinks    Comment: Occasional beer, monthly  . Drug use: No    Home Medications Prior to Admission medications   Medication Sig Start Date End Date Taking? Authorizing Provider  acetaminophen (TYLENOL) 325  MG tablet Take 650 mg by mouth every 6 (six) hours as needed. Notify MD id not relieved (Not to exceed 3000 mg ) in 24 hour per stranding order 07/28/19   [provider]  acetaminophen (TYLENOL) 325 MG tablet Take 650 mg by mouth in the morning. 10/10/19   [provider]  Amino Acids-Protein Hydrolys (FEEDING SUPPLEMENT, PRO-STAT SUGAR FREE 64,) LIQD Take 30 mLs by mouth in the morning and at bedtime.    [provider]  amLODipine (NORVASC) 5 MG tablet Take 1 tablet (5 mg total) by mouth daily. 10/10/19   Eileen Stanford, PA-C  amoxicillin (AMOXIL) 500 MG capsule Take 2,000 mg by mouth as needed. Prior to dental procedure    [provider]  aspirin 81 MG EC tablet TAKE 1 TABLET (81 MG TOTAL) BY MOUTH DAILY. SWALLOW WHOLE. 09/25/18   Harvie Heck, MD  atorvastatin (LIPITOR) 80 MG tablet Take 1 tablet (80 mg total) by mouth at bedtime. 07/08/19   [name], MD  clopidogrel (PLAVIX) 75 MG tablet Take 1 tablet (75 mg total) by mouth daily with breakfast. 06/16/19   [name], MD  diclofenac Sodium (VOLTAREN) 1 %  GEL Apply 4 g topically 4 (four) times daily as needed (Pain). Apply to bilateral hips and knees    [provider]  doxycycline (VIBRA-TABS) 100 MG tablet Take 100 mg by mouth 2 (two) times daily.    [provider]  Ensure (ENSURE) Take 237 mLs by mouth in the morning and at bedtime. Strawberry if available    [provider]  furosemide (LASIX) 80 MG tablet Take 80 mg by mouth 2 (two) times daily.    [provider]  hydrALAZINE (APRESOLINE) 50 MG tablet Take 1 tablet (50 mg total) by mouth 3 (three) times daily. 06/16/19   [name], MD  Insulin Degludec-Liraglutide (XULTOPHY) 100-3.6 UNIT-MG/ML SOPN Inject 14 Units into the skin daily.    [provider]  lactulose (CHRONULAC) 10 GM/15ML solution Take 20 g by mouth 2 (two) times daily.  10/17/19   [provider]  metoprolol succinate (TOPROL-XL) 25 MG 24 hr tablet Take 1 tablet (25 mg total) by mouth daily. 09/19/19   Angelica Pou, MD  Multiple Vitamins-Minerals (DECUBI-VITE) CAPS Take 1 capsule by mouth daily.    [provider]  Nutritional Supplements (NUTRITIONAL SUPPLEMENT PO) Take 1 each by mouth in the morning and at bedtime. Social research officer, government, Historical, MD  PARoxetine (PAXIL) 30 MG tablet TAKE 1 TABLET BY MOUTH DAILY 11/17/18   [name], MD  potassium chloride SA (KLOR-CON) 20 MEQ tablet Take 20 mEq by mouth daily.  11/20/19   [provider]  saccharomyces boulardii (FLORASTOR) 250 MG capsule Take 250 mg by mouth 2 (two) times daily.    [provider]    Allergies    Other  Review of Systems   Review of Systems  All other systems reviewed and are negative.   Physical Exam Updated Vital Signs BP 115/67   Pulse 67   Temp 98.2 F (36.8 C) (Oral)   Resp 15   SpO2 98%   Physical Exam Vitals and nursing note reviewed.  Constitutional:      Appearance: She is well-developed and well-nourished.  HENT:     Head:  Normocephalic and atraumatic.  Cardiovascular:     Rate and Rhythm: Normal rate and regular rhythm.     Heart sounds: Murmur heard.  Pulmonary:     Effort: Pulmonary effort is normal. No respiratory distress.     Breath sounds: Normal breath sounds.  Abdominal:     Palpations: Abdomen is soft.     Tenderness: There is no abdominal tenderness. There is no guarding or rebound.  Musculoskeletal:        General: No tenderness or edema.     Comments: There is a necrotic eschar to the right heel. There is a small eschar to the right great toe.  No significant swelling to the feet. Unable to palpate DP pulses bilaterally. These can be obtained by Doppler signals.  Skin:    General: Skin is warm and dry.  Neurological:     Mental Status: She is alert and oriented to person, place, and time.     Comments: Five out of five strength and bilateral upper extremities. 4/5 strength in bilateral lower extremities - patient states this is been ongoing for very long time.  Psychiatric:        Mood and Affect: Mood and affect normal.        Behavior: Behavior normal.     ED Results / Procedures / Treatments   Labs (all labs ordered are listed, but only abnormal results are displayed) Labs Reviewed  COMPREHENSIVE METABOLIC PANEL - Abnormal; Notable for the following components:      Result Value   BUN 115 (*)    Creatinine, Ser 2.17 (*)    Albumin 2.4 (*)    Alkaline Phosphatase 161 (*)    GFR, Estimated 23 (*)    All other components within normal limits  CBC WITH DIFFERENTIAL/PLATELET - Abnormal; Notable for the following components:   Monocytes Absolute 1.1 (*)    All other components within normal limits  RESP PANEL BY RT-PCR (FLU A&B, COVID) ARPGX2  TSH  URINALYSIS, ROUTINE W REFLEX MICROSCOPIC    EKG EKG Interpretation  Date/Time:  Wednesday February 08 2020 16:33:03 EST Ventricular Rate:  72 PR Interval:    QRS Duration: 164 QT Interval:  489 QTC Calculation: 536 R  Axis:   86 Text Interpretation: Ventricular-paced rhythm Confirmed by Quintella Reichert [phone]) on 02/08/2020 4:37:46 PM   Radiology DG Chest 2 View  Result Date: 02/08/2020 CLINICAL DATA:  Weakness. EXAM: CHEST - 2 VIEW COMPARISON:  Radiograph 07/21/2019 FINDINGS: Post TAVR. Chronic cardiomegaly. Unchanged mediastinal contours. Aortic tortuosity and atherosclerosis. Post median sternotomy. Previous right-sided pacemaker has been removed. No focal airspace disease, pulmonary edema, pleural effusion, or pneumothorax. No acute osseous abnormalities are seen. Multiple overlying monitoring devices in place. IMPRESSION: 1. No acute findings. 2. Chronic cardiomegaly, aortic tortuosity and atherosclerosis. Previous TAVR. Electronically Signed   By: Keith Rake M.D.   On: 02/08/2020 18:21   DG Foot Complete Right  Result Date: 02/08/2020 CLINICAL DATA:  Weakness EXAM: RIGHT FOOT COMPLETE - 3+ VIEW COMPARISON:  11/24/2019 FINDINGS: Osteopenia limits the exam. Vascular calcifications. Ulcer at the tip of the great toe. Progressed bony destructive change involving the distal phalanx suspect for osteomyelitis. Sclerosis and deformity of the second proximal phalanx probably due to remote fracture. Bunion at the first metatarsal head with degenerative changes at the MTP joint. IMPRESSION: 1. Progressed bony destructive change involving the distal phalanx of the great toe, suspect for osteomyelitis. Overlying ulcer at the tip of the great toe. 2. Suspect chronic fracture deformity of the second proximal phalanx Electronically Signed   By: Donavan Foil M.D.   On: 02/08/2020 18:23    Procedures Procedures (including  critical care time)  Medications Ordered in ED Medications - No data to display  ED Course  I have reviewed the triage vital signs and the nursing notes.  Pertinent labs & imaging results that were available during my care of the patient were reviewed by me and considered in my medical  decision making (see chart for details).    MDM Rules/Calculators/A&P                         patient here for one month of poor appetite. She is chronically ill appearing on evaluation but in no acute distress. She had angiography of the right lower extremity one month ago. She has chronic wounds to the right heel, right great toe and is on chronic antibiotics for osteomyelitis. There is no evidence of acute decompensation or sepsis. She has no abdominal pain or tenderness concerning for intestinal angina. Labs with stable creatinine but elevation and BUN when compared to priors. COVID-19 test is negative. Discussed with on-call nephrologist findings of labs, he feels based on uremia alone she does not needs admission at this time. Plan to discharge home with PCP and nephrology follow-up as well as close return precautions. Recommend that she encourage oral fluids and may start an appetite stimulant at home to see if this helps with her poor appetite. Findings of studies discussed with patient and daughter. Final Clinical Impression(s) / ED Diagnoses Final diagnoses:  Poor appetite  Stage 3b chronic kidney disease [facility])    Rx / DC Orders ED Discharge Orders    None       Quintella Reichert, MD 02/09/20 0004

## 2020-02-08 NOTE — Progress Notes (Signed)
Location:  [facility] Room Number: 217 A Place of Service:  SNF (31) Provider:  Kenard Gower, DNP, FNP-BC  Patient Care Team: Pecola Lawless, MD as PCP - General (Internal Medicine) Lars Masson, MD as PCP - Cardiology (Cardiology) Regan Lemming, MD as PCP - Electrophysiology (Cardiology) Medina-Vargas, Margit Banda, NP as Nurse Practitioner (Internal Medicine) Rehab, [facility]  Extended Emergency Contact Information Primary Emergency Contact: Malen Gauze [place] Home Phone: [phone] Mobile Phone: [phone] Relation: Daughter Secondary Emergency Contact: Armstrong,[name]  [place] Home Phone: [phone] Relation: Daughter  Code Status:  Full Code  Goals of care: Advanced Directive information Advanced Directives 01/04/2020  Does Patient Have a Medical Advance Directive? No  Type of Advance Directive -  Does patient want to make changes to medical advance directive? -  Copy of Healthcare Power of Attorney in Chart? -  Would patient like information on creating a medical advance directive? No - Patient declined  Pre-existing out of facility DNR order (yellow form or pink MOST form) -     Chief Complaint  Patient presents with  . Acute Visit    Poor appetite    HPI:  Pt is a 74 y.o. female seen today for poor appetite. She has a PMH of chronic kidney disease, CAD, severe aortic stenosis, history of breast cancer, essential hypertension, dyslipidemia, stroke and chronic diastolic CHF. Resident has been having poor appetite. Daughters would bring food and she would not eat them. She was seen by dietician and was started on Ensure twice a day. She is currently taking Doxycycline for osteomyelitis of right great toe. No reported fever. She was complaining of right foot pain and was started on Neurontin and Percocet for pain. Percocet and Neurontin were  eventually discontinued because it was making her sleepy and she has stopped ambulating. Daughter demanded for her to be transferred to the hospital for evaluation of poor appetite. Explained that patient will potentially be exposed to infectious diseases while in the ED. Daughter still wanted patient to be transferred to hospital despite potential exposures to infectious diseases. This is the second time family requested for resident to be transferred to the ED for poor appetite. She had weight loss of 13.2 lbs in 2 months.    Past Medical History:  Diagnosis Date  . Acute on chronic diastolic CHF (congestive heart failure) (HCC) 06/22/2017  . Acute on chronic renal failure (HCC) 05/25/2011  . Adenocarcinoma of breast (HCC) 1996   Completed tamoxifen and had mastectomy.  . Aortic stenosis, severe    s/p aortic valve replacement with porcine valve 06/2004.  ECHO 2010 EF 65%, LVH, diastolic dysfxn, Bioprostetic aoritc valve, mild AS. ECHO 2013 EF 60%, Nl aortic artificial valve, dynamic obstruction in the outflow tract   Class IIb rec for annual TTE after 5 yrs. She had a TTE 2013    . Arthritis   . CAD (coronary artery disease) 2006   s/p CABG (5/06) w/ saphenous vein to RCA at time of AVR  . CKD (chronic kidney disease) stage 2, GFR 60-89 ml/min 06/03/2011   She is no longer taking NSAIDs.   Marland Kitchen CVA (cerebral infarction) 2006   Post-op from AVR. Presumed embolic in nature. Carotid stenosis of R 60-79%. Repeat dopplers 4/10 no R stenosis and L stenosis of 1-29%.  . Depression    Controlled on Paxil  . Diabetes mellitus 1992   Dx 04/25/1990. Now insulin dependent,  started 2008. On ACEI.   . Diverticulosis 2001  . History of 2019 novel coronavirus disease (COVID-19)   . Hyperlipidemia    Mgmt with a statin  . Hypertension    Requires 4 drug tx  . Osteoporosis 2006   DEXA 10/06 : L femur T -2.8, R -2.7. Lumbar T -2.4. On bisphosphonates and  Calcium / Vit D.  . Peripheral vascular disease ([place])    . Presence of permanent cardiac pacemaker   . Refusal of blood transfusions as patient is Jehovah's Witness   . Stroke [facility])    Past Surgical History:  Procedure Laterality Date  . ABDOMINAL HYSTERECTOMY  1987   for fibroids  . AORTIC VALVE REPLACEMENT  2006  . CHOLECYSTECTOMY    . CORONARY ARTERY BYPASS GRAFT  2006   Saphenous vein to RCA at time of AVR. Course complicated by acute respiratory failure, post-op PTX, ARI, ileus, CVA  . LOWER EXTREMITY ANGIOGRAPHY N/A 01/04/2020   Procedure: LOWER EXTREMITY ANGIOGRAPHY - Right;  Surgeon: Cherre Robins, MD;  Location: [place] [facility];  Service: Cardiovascular;  Laterality: N/A;  . MASTECTOMY Left 1995   L for adenocarcinoma  . PACEMAKER IMPLANT N/A 02/09/2019   Procedure: PACEMAKER IMPLANT;  Surgeon: Constance Haw, MD;  Location: [place] [facility];  Service: Cardiovascular;  Laterality: N/A;  . PERIPHERAL VASCULAR BALLOON ANGIOPLASTY  01/04/2020   Procedure: PERIPHERAL VASCULAR BALLOON ANGIOPLASTY;  Surgeon: Cherre Robins, MD;  Location: [place] [facility];  Service: Cardiovascular;;  Rt AT  . RIGHT HEART CATH AND CORONARY/GRAFT ANGIOGRAPHY N/A 02/09/2019   Procedure: RIGHT HEART CATH AND CORONARY/GRAFT ANGIOGRAPHY;  Surgeon: Sherren Mocha, MD;  Location: [place] [facility];  Service: Cardiovascular;  Laterality: N/A;  . TEE WITHOUT CARDIOVERSION N/A 07/20/2019   Procedure: TRANSESOPHAGEAL ECHOCARDIOGRAM (TEE);  Surgeon: Jerline Pain, MD;  Location: [facility] ENDOSCOPY;  Service: Cardiovascular;  Laterality: N/A;  . TRANSCATHETER AORTIC VALVE REPLACEMENT, TRANSFEMORAL N/A 03/01/2019   Procedure: TRANSCATHETER AORTIC VALVE REPLACEMENT, TRANSFEMORAL;  Surgeon: Sherren Mocha, MD;  Location: [place] [facility];  Service: Open Heart Surgery;  Laterality: N/A;    Allergies  Allergen Reactions  . 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," as the patient is a Jehovah's Witness    Outpatient Encounter Medications as of  02/08/2020  Medication Sig  . acetaminophen (TYLENOL) 325 MG tablet Take 650 mg by mouth every 6 (six) hours as needed. Notify MD id not relieved (Not to exceed 3000 mg ) in 24 hour per stranding order  . acetaminophen (TYLENOL) 325 MG tablet Take 650 mg by mouth in the morning.  . Amino Acids-Protein Hydrolys (FEEDING SUPPLEMENT, PRO-STAT SUGAR FREE 64,) LIQD Take 30 mLs by mouth in the morning and at bedtime.  Marland Kitchen amLODipine (NORVASC) 5 MG tablet Take 1 tablet (5 mg total) by mouth daily.  Marland Kitchen amoxicillin (AMOXIL) 500 MG capsule Take 2,000 mg by mouth as needed. Prior to dental procedure  . aspirin 81 MG EC tablet TAKE 1 TABLET (81 MG TOTAL) BY MOUTH DAILY. SWALLOW WHOLE.  Marland Kitchen atorvastatin (LIPITOR) 80 MG tablet Take 1 tablet (80 mg total) by mouth at bedtime.  . clopidogrel (PLAVIX) 75 MG tablet Take 1 tablet (75 mg total) by mouth daily with breakfast.  . diclofenac Sodium (VOLTAREN) 1 % GEL Apply 4 g topically 4 (four) times daily as needed (Pain). Apply to bilateral hips and knees  . doxycycline (VIBRA-TABS) 100 MG tablet Take 100 mg by mouth  2 (two) times daily.  . Ensure (ENSURE) Take 237 mLs by mouth in the morning and at bedtime. Strawberry if available  . furosemide (LASIX) 80 MG tablet Take 80 mg by mouth 2 (two) times daily.  . hydrALAZINE (APRESOLINE) 50 MG tablet Take 1 tablet (50 mg total) by mouth 3 (three) times daily.  . Insulin Degludec-Liraglutide (XULTOPHY) 100-3.6 UNIT-MG/ML SOPN Inject 14 Units into the skin daily.  Marland Kitchen lactulose (CHRONULAC) 10 GM/15ML solution Take 20 g by mouth 2 (two) times daily.   . metoprolol succinate (TOPROL-XL) 25 MG 24 hr tablet Take 1 tablet (25 mg total) by mouth daily.  . Multiple Vitamins-Minerals (DECUBI-VITE) CAPS Take 1 capsule by mouth daily.  . Nutritional Supplements (NUTRITIONAL SUPPLEMENT PO) Take 1 each by mouth in the morning and at bedtime. Magic Cup  . PARoxetine (PAXIL) 30 MG tablet TAKE 1 TABLET BY MOUTH DAILY  . potassium chloride SA  (KLOR-CON) 20 MEQ tablet Take 20 mEq by mouth daily.   Marland Kitchen saccharomyces boulardii (FLORASTOR) 250 MG capsule Take 250 mg by mouth 2 (two) times daily.   No facility-administered encounter medications on file as of 02/08/2020.    Review of Systems  GENERAL: Poor appetite MOUTH and THROAT: Denies oral discomfort, gingival pain or bleeding, pain from teeth or hoarseness   RESPIRATORY: no cough, SOB, DOE, wheezing, hemoptysis CARDIAC: No chest pain, edema or palpitations GI: No abdominal pain, diarrhea, constipation, heart burn, nausea or vomiting GU: Denies dysuria, frequency, hematuria, incontinence, or discharge NEUROLOGICAL: Denies dizziness, syncope, numbness, or headache PSYCHIATRIC: Denies feelings of depression or anxiety. No report of hallucinations, insomnia, paranoia, or agitation   Immunization History  Administered Date(s) Administered  . Fluad Quad(high Dose 65+) 02/11/2019  . Influenza Split 11/12/2010, 11/10/2011  . Influenza Whole 11/07/2009  . Influenza,inj,Quad PF,6+ Mos 10/19/2012, 01/05/2014, 11/08/2015, 01/29/2017  . Influenza-Unspecified 12/15/2019  . Moderna Sars-Covid-2 Vaccination 03/09/2019, 04/06/2019  . Pneumococcal Conjugate-13 06/07/2014  . Pneumococcal Polysaccharide-23 12/26/1994, 03/05/2010  . Td 09/24/1993  . Tdap 11/12/2010, 07/12/2013   Pertinent  Health Maintenance Due  Topic Date Due  . LIPID PANEL  02/07/2020  . FOOT EXAM  02/24/2020 (Originally 07/10/2017)  . OPHTHALMOLOGY EXAM  02/24/2020 (Originally 11/07/2016)  . HEMOGLOBIN A1C  04/05/2020  . INFLUENZA VACCINE  Completed  . DEXA SCAN  Completed  . PNA vac Low Risk Adult  Completed   Fall Risk  01/10/2020 08/02/2019 06/16/2019 05/19/2019 05/04/2019  Falls in the past year? $RemoveBe'1 1 1 1 'DdTVnCIjz$ 0  Comment - - pt denies-but dtr stated pt had a fall in last mths pt denies falls-but dtr states pt has had a fall -  Number falls in past yr: 1 1 0 0 -  Injury with Fall? 0 0 0 0 -  Risk Factor Category  - - - -  -  Risk for fall due to : History of fall(s);Impaired balance/gait Impaired mobility History of fall(s);Impaired balance/gait;Impaired mobility;Medication side effect History of fall(s);Impaired balance/gait;Impaired mobility;Medication side effect No Fall Risks  Risk for fall due to: Comment - - - - -  Follow up Falls evaluation completed;Education provided;Falls prevention discussed Falls evaluation completed Falls evaluation completed;Falls prevention discussed Falls evaluation completed Falls evaluation completed     Vitals:   02/08/20 1401  BP: 127/69  Pulse: 66  Resp: 16  Temp: 97.7 F (36.5 C)  Weight: 146 lb 6.4 oz (66.4 kg)  Height: $Remove'5\' 5"'RpjxUGG$  (1.651 m)   Body mass index is 24.36 kg/m.  Physical Exam  GENERAL  APPEARANCE: Well nourished. In no acute distress. Normal body habitus SKIN:  Right heel has unstageable pressure ulcer and right great toe wound MOUTH and THROAT: Lips are without lesions. Oral mucosa is moist and without lesions. Tongue is normal in shape, size, and color and without lesions RESPIRATORY: Breathing is even & unlabored, BS CTAB CARDIAC: RRR, no murmur,no extra heart sounds, no edema GI: Abdomen soft, normal BS, no masses, no tenderness EXTREMITIES:  Able to move X4 extremities NEUROLOGICAL: There is no tremor. Speech is clear. Alert and oriented X 3. PSYCHIATRIC:  Affect and behavior are appropriate  Labs reviewed: Recent Labs    03/02/19 0253 05/04/19 1144 07/20/19 0301 07/21/19 0356 08/11/19 1638 08/16/19 0000 08/23/19 1701 09/20/19 0000 01/04/20 1222 01/13/20 0000 01/17/20 0000 01/25/20 0000  NA 140   < > 137   < > 139   < > 143   < > 139 134* 137 138  K 3.7   < > 3.6   < > 4.9   < > 4.4   < > 4.5 4.6 4.3 4.7  CL 105   < > 98   < > 98   < > 102   < > 107 95* 103 101  CO2 26   < > 27   < > 27   < > 25   < >  --  23* 23* 27*  GLUCOSE 115*   < > 239*   < > 171*  --  121*  --  109*  --   --   --   BUN 24*   < > 26*   < > 45*   < > 44*   < >  56* 80* 80* 64*  CREATININE 1.47*   < > 1.56*   < > 1.70*   < > 1.62*   < > 2.80* 2.4* 2.0* 1.8*  CALCIUM 8.8*   < > 8.6*   < > 9.0   < > 9.4   < >  --  9.1 8.7 8.7  MG 1.7  --  1.8  --   --   --   --   --   --   --   --   --    < > = values in this interval not displayed.   Recent Labs    02/24/19 1020 07/17/19 2150 11/25/19 0000 12/09/19 0000  AST $Re'16 17 14  'mqx$ --   ALT $Re'13 13 15  'gOk$ --   ALKPHOS 125 141* 140* 149*  BILITOT 0.6 0.8  --   --   PROT 7.8 7.6  --   --   ALBUMIN 3.4* 3.4* 2.9*  --    Recent Labs    07/17/19 2150 07/19/19 0219 07/20/19 0723 08/02/19 0000 01/13/20 0000 01/17/20 0000 01/26/20 0000  WBC 18.9* 16.1* 9.2   < > 12.0 8.7 9.2  NEUTROABS 17.3*  --   --    < > 8.40 5.10 5.60  HGB 12.8 10.3* 10.3*   < > 13.6 11.7* 11.1*  HCT 40.0 32.2* 32.4*   < > 42 36 34*  MCV 94.8 93.9 93.9  --   --   --   --   PLT 256 184 193   < > 246 212 190   < > = values in this interval not displayed.   Lab Results  Component Value Date   TSH 1.630 02/07/2019   Lab Results  Component Value Date   HGBA1C 6.3 (H) 01/04/2020  Lab Results  Component Value Date   CHOL 99 02/07/2019   HDL 26 (L) 02/07/2019   LDLCALC 50 02/07/2019   TRIG 117 02/07/2019   CHOLHDL 3.8 02/07/2019     Assessment/Plan  1. Anorexia - Wt Readings from Last 3 Encounters:  02/08/20 146 lb 6.4 oz (66.4 kg)  02/07/20 148 lb (67.1 kg)  02/03/20 145 lb (65.8 kg)   -  On Ensure and magic cup for supplementation -  Daughter demanding for patient to be transferred to ED despite explanation of potential exposures to infectious diseases  2. Osteomyelitis of great toe of right foot ([place]) -  Will continue Doxycycline and wound treatment -  Follows up with podiatry    Family/ staff Communication:  Discussed plan of care with resident and charge nurse.  Labs/tests ordered:  None  Goals of care:  Long-term care   Durenda Age, DNP, MSN, FNP-BC [facility] [phone] (Monday-Friday 8:00 a.m. - 5:00 p.m.) [phone] (after hours)

## 2020-02-09 ENCOUNTER — Encounter: Payer: Self-pay | Admitting: Adult Health

## 2020-02-09 ENCOUNTER — Non-Acute Institutional Stay (SKILLED_NURSING_FACILITY): Payer: Medicare Other | Admitting: Adult Health

## 2020-02-09 DIAGNOSIS — E1140 Type 2 diabetes mellitus with diabetic neuropathy, unspecified: Secondary | ICD-10-CM | POA: Diagnosis not present

## 2020-02-09 DIAGNOSIS — Z794 Long term (current) use of insulin: Secondary | ICD-10-CM

## 2020-02-09 DIAGNOSIS — F339 Major depressive disorder, recurrent, unspecified: Secondary | ICD-10-CM | POA: Diagnosis not present

## 2020-02-09 DIAGNOSIS — M79671 Pain in right foot: Secondary | ICD-10-CM | POA: Diagnosis not present

## 2020-02-09 NOTE — Progress Notes (Signed)
Location:  [facility] Room Number: 217-A Place of Service:  SNF (31) Provider:  Durenda Age, DNP, FNP-BC  Patient Care Team: Hendricks Limes, MD as PCP - General (Internal Medicine) Dorothy Spark, MD as PCP - Cardiology (Cardiology) Constance Haw, MD as PCP - Electrophysiology (Cardiology) Medina-Vargas, Senaida Lange, NP as Nurse Practitioner (Internal Medicine) Rehab, [facility])  Extended Emergency Contact Information Primary Emergency Contact: Lieutenant [place] Phone: [phone] Mobile Phone: [phone] Relation: Daughter Secondary Emergency Contact: Armstrong,[name]  [place] Phone: [phone] Relation: Daughter  Code Status:  FULL CODE  Goals of care: Advanced Directive information Advanced Directives 02/08/2020  Does Patient Have a Medical Advance Directive? No  Type of Advance Directive -  Does patient want to make changes to medical advance directive? -  Copy of [place] in Chart? -  Would patient like information on creating a medical advance directive? No - Patient declined  Pre-existing out of facility DNR order (yellow form or pink MOST form) -     Chief Complaint  Patient presents with  . Acute Visit    Patient is seen for a change in condition.    HPI:  Pt is a 74 y.o. female seen today for change in condition.  She is a long-term care resident of [facility].  She has a PMH of chronic kidney disease, CAD, severe aortic stenoses, history of breast cancer, essential hypertension, dyslipidemia, stroke and chronic diastolic CHF. She was seen in her room today. She was complaining of pain on her right foot. She stated that she has right fot pain at night. She used to be on Neurontin and Percocet but was making her sleepy so they were discontinued. She has right great toe wound and bilateral  heel wounds. She is not able to lift both feet. Hoyer lift is now being used for transfers. She has a history of of peripheral vascular disease and had angiography of the lower extremities on January 04, 2020. She continues to take Doxycycline for osteomyelitis of right great toe.    Past Medical History:  Diagnosis Date  . Acute on chronic diastolic CHF (congestive heart failure) ([place]) 06/22/2017  . Acute on chronic renal failure ([place]) 05/25/2011  . Adenocarcinoma of breast ([place]) 1996   Completed tamoxifen and had mastectomy.  . Aortic stenosis, severe    s/p aortic valve replacement with porcine valve 06/2004.  ECHO 2010 EF 06%, LVH, diastolic dysfxn, Bioprostetic aoritc valve, mild AS. ECHO 2013 EF 60%, Nl aortic artificial valve, dynamic obstruction in the outflow tract   Class IIb rec for annual TTE after 5 yrs. She had a TTE 2013    . Arthritis   . CAD (coronary artery disease) 2006   s/p CABG (5/06) w/ saphenous vein to RCA at time of AVR  . CKD (chronic kidney disease) stage 2, GFR 60-89 ml/min 06/03/2011   She is no longer taking NSAIDs.   Marland Kitchen CVA (cerebral infarction) 2006   Post-op from AVR. Presumed embolic in nature. Carotid stenosis of R 60-79%. Repeat dopplers 4/10 no R stenosis and L stenosis of 1-29%.  . Depression    Controlled on Paxil  . Diabetes mellitus 1992   Dx 04/25/1990. Now insulin dependent, started 2008. On ACEI.   . Diverticulosis 2001  . History of 2019 novel coronavirus disease (COVID-19)   . Hyperlipidemia    Mgmt with a statin  .  Hypertension    Requires 4 drug tx  . Osteoporosis 2006   DEXA 10/06 : L femur T -2.8, R -2.7. Lumbar T -2.4. On bisphosphonates and  Calcium / Vit D.  . Peripheral vascular disease ([place])   . Presence of permanent cardiac pacemaker   . Refusal of blood transfusions as patient is Jehovah's Witness   . Stroke [facility])    Past Surgical History:  Procedure Laterality Date  . ABDOMINAL HYSTERECTOMY  1987   for fibroids  . AORTIC VALVE  REPLACEMENT  2006  . CHOLECYSTECTOMY    . CORONARY ARTERY BYPASS GRAFT  2006   Saphenous vein to RCA at time of AVR. Course complicated by acute respiratory failure, post-op PTX, ARI, ileus, CVA  . LOWER EXTREMITY ANGIOGRAPHY N/A 01/04/2020   Procedure: LOWER EXTREMITY ANGIOGRAPHY - Right;  Surgeon: Cherre Robins, MD;  Location: [place] CV LAB;  Service: Cardiovascular;  Laterality: N/A;  . MASTECTOMY Left 1995   L for adenocarcinoma  . PACEMAKER IMPLANT N/A 02/09/2019   Procedure: PACEMAKER IMPLANT;  Surgeon: Constance Haw, MD;  Location: [place] CV LAB;  Service: Cardiovascular;  Laterality: N/A;  . PERIPHERAL VASCULAR BALLOON ANGIOPLASTY  01/04/2020   Procedure: PERIPHERAL VASCULAR BALLOON ANGIOPLASTY;  Surgeon: Cherre Robins, MD;  Location: [place] CV LAB;  Service: Cardiovascular;;  Rt AT  . RIGHT HEART CATH AND CORONARY/GRAFT ANGIOGRAPHY N/A 02/09/2019   Procedure: RIGHT HEART CATH AND CORONARY/GRAFT ANGIOGRAPHY;  Surgeon: Sherren Mocha, MD;  Location: [place] CV LAB;  Service: Cardiovascular;  Laterality: N/A;  . TEE WITHOUT CARDIOVERSION N/A 07/20/2019   Procedure: TRANSESOPHAGEAL ECHOCARDIOGRAM (TEE);  Surgeon: Jerline Pain, MD;  Location: [facility] ENDOSCOPY;  Service: Cardiovascular;  Laterality: N/A;  . TRANSCATHETER AORTIC VALVE REPLACEMENT, TRANSFEMORAL N/A 03/01/2019   Procedure: TRANSCATHETER AORTIC VALVE REPLACEMENT, TRANSFEMORAL;  Surgeon: Sherren Mocha, MD;  Location: [place] CV LAB;  Service: Open Heart Surgery;  Laterality: N/A;    Allergies  Allergen Reactions  . 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," as the patient is a Jehovah's Witness    Outpatient Encounter Medications as of 02/09/2020  Medication Sig  . acetaminophen (TYLENOL) 325 MG tablet Take 650 mg by mouth every 6 (six) hours as needed. Notify MD id not relieved (Not to exceed 3000 mg ) in 24 hour per stranding order  . acetaminophen (TYLENOL) 325 MG tablet Take  650 mg by mouth in the morning.  . Amino Acids-Protein Hydrolys (FEEDING SUPPLEMENT, PRO-STAT SUGAR FREE 64,) LIQD Take 30 mLs by mouth in the morning and at bedtime.  Marland Kitchen amLODipine (NORVASC) 5 MG tablet Take 1 tablet (5 mg total) by mouth daily.  Marland Kitchen amoxicillin (AMOXIL) 500 MG capsule Take 2,000 mg by mouth as needed. Prior to dental procedure  . aspirin 81 MG EC tablet TAKE 1 TABLET (81 MG TOTAL) BY MOUTH DAILY. SWALLOW WHOLE.  Marland Kitchen atorvastatin (LIPITOR) 80 MG tablet Take 1 tablet (80 mg total) by mouth at bedtime.  . clopidogrel (PLAVIX) 75 MG tablet Take 1 tablet (75 mg total) by mouth daily with breakfast.  . diclofenac Sodium (VOLTAREN) 1 % GEL Apply 4 g topically 4 (four) times daily as needed (Pain). Apply to bilateral hips and knees  . doxycycline (VIBRA-TABS) 100 MG tablet Take 100 mg by mouth 2 (two) times daily.  . Ensure (ENSURE) Take 237 mLs by mouth in the morning and at bedtime. Strawberry if available  . furosemide (LASIX) 80 MG tablet Take 80  mg by mouth 2 (two) times daily.  . hydrALAZINE (APRESOLINE) 50 MG tablet Take 1 tablet (50 mg total) by mouth 3 (three) times daily.  . Insulin Degludec-Liraglutide (XULTOPHY) 100-3.6 UNIT-MG/ML SOPN Inject 14 Units into the skin daily.  Marland Kitchen lactulose (CHRONULAC) 10 GM/15ML solution Take 20 g by mouth 2 (two) times daily.   . metoprolol succinate (TOPROL-XL) 25 MG 24 hr tablet Take 1 tablet (25 mg total) by mouth daily.  . Multiple Vitamins-Minerals (DECUBI-VITE) CAPS Take 1 capsule by mouth daily.  Marland Kitchen PARoxetine (PAXIL) 30 MG tablet TAKE 1 TABLET BY MOUTH DAILY  . potassium chloride SA (KLOR-CON) 20 MEQ tablet Take 20 mEq by mouth daily.   Marland Kitchen saccharomyces boulardii (FLORASTOR) 250 MG capsule Take 250 mg by mouth 2 (two) times daily.  . [DISCONTINUED] Nutritional Supplements (NUTRITIONAL SUPPLEMENT PO) Take 1 each by mouth in the morning and at bedtime. Magic Cup   No facility-administered encounter medications on file as of 02/09/2020.     Review of Systems  GENERAL: No fever or chills  MOUTH and THROAT: Denies oral discomfort, gingival pain or bleeding RESPIRATORY: no cough, SOB, DOE, wheezing, hemoptysis CARDIAC: No chest pain, edema or palpitations GI: No abdominal pain, diarrhea, constipation, heart burn, nausea or vomiting GU: Denies dysuria, frequency, hematuria or discharge NEUROLOGICAL: Denies dizziness, syncope, numbness, or headache PSYCHIATRIC: Denies feelings of depression or anxiety. No report of hallucinations, insomnia, paranoia, or agitation   Immunization History  Administered Date(s) Administered  . Fluad Quad(high Dose 65+) 02/11/2019  . Influenza Split 11/12/2010, 11/10/2011  . Influenza Whole 11/07/2009  . Influenza,inj,Quad PF,6+ Mos 10/19/2012, 01/05/2014, 11/08/2015, 01/29/2017  . Influenza-Unspecified 12/15/2019  . Moderna Sars-Covid-2 Vaccination 03/09/2019, 04/06/2019  . Pneumococcal Conjugate-13 06/07/2014  . Pneumococcal Polysaccharide-23 12/26/1994, 03/05/2010  . Td 09/24/1993  . Tdap 11/12/2010, 07/12/2013   Pertinent  Health Maintenance Due  Topic Date Due  . LIPID PANEL  02/07/2020  . FOOT EXAM  02/24/2020 (Originally 07/10/2017)  . OPHTHALMOLOGY EXAM  02/24/2020 (Originally 11/07/2016)  . HEMOGLOBIN A1C  04/05/2020  . INFLUENZA VACCINE  Completed  . DEXA SCAN  Completed  . PNA vac Low Risk Adult  Completed   Fall Risk  01/10/2020 08/02/2019 06/16/2019 05/19/2019 05/04/2019  Falls in the past year? $RemoveBe'1 1 1 1 'AZzpiBKsU$ 0  Comment - - pt denies-but dtr stated pt had a fall in last mths pt denies falls-but dtr states pt has had a fall -  Number falls in past yr: 1 1 0 0 -  Injury with Fall? 0 0 0 0 -  Risk Factor Category  - - - - -  Risk for fall due to : History of fall(s);Impaired balance/gait Impaired mobility History of fall(s);Impaired balance/gait;Impaired mobility;Medication side effect History of fall(s);Impaired balance/gait;Impaired mobility;Medication side effect No Fall Risks   Risk for fall due to: Comment - - - - -  Follow up Falls evaluation completed;Education provided;Falls prevention discussed Falls evaluation completed Falls evaluation completed;Falls prevention discussed Falls evaluation completed Falls evaluation completed     Vitals:   02/09/20 1224  BP: 136/70  Pulse: 68  Resp: 20  Temp: (!) 97.1 F (36.2 C)  TempSrc: Oral  Weight: 146 lb 6.4 oz (66.4 kg)  Height: $Remove'5\' 5"'nURklLW$  (1.651 m)   Body mass index is 24.36 kg/m.  Physical Exam  GENERAL APPEARANCE: Well nourished. In no acute distress. Normal body habitus SKIN:  BIlateral heels and right great toe wound with dressing MOUTH and THROAT: Lips are without lesions. Oral mucosa is  moist and without lesions. Tongue is normal in shape, size, and color and without lesions RESPIRATORY: Breathing is even & unlabored, BS CTAB CARDIAC: RRR, no murmur,no extra heart sounds, no edema GI: Abdomen soft, normal BS, no masses, no tenderness NEUROLOGICAL: There is no tremor. Speech is clear. Alert to self, disoriented to time and place PSYCHIATRIC:  Affect and behavior are appropriate  Labs reviewed: Recent Labs    03/02/19 0253 05/04/19 1144 07/20/19 0301 07/21/19 0356 08/23/19 1701 09/20/19 0000 01/04/20 1222 01/13/20 0000 01/17/20 0000 01/25/20 0000 02/08/20 1827  NA 140   < > 137   < > 143   < > 139   < > 137 138 139  K 3.7   < > 3.6   < > 4.4   < > 4.5   < > 4.3 4.7 4.5  CL 105   < > 98   < > 102   < > 107   < > 103 101 104  CO2 26   < > 27   < > 25   < >  --    < > 23* 27* 23  GLUCOSE 115*   < > 239*   < > 121*  --  109*  --   --   --  81  BUN 24*   < > 26*   < > 44*   < > 56*   < > 80* 64* 115*  CREATININE 1.47*   < > 1.56*   < > 1.62*   < > 2.80*   < > 2.0* 1.8* 2.17*  CALCIUM 8.8*   < > 8.6*   < > 9.4   < >  --    < > 8.7 8.7 9.3  MG 1.7  --  1.8  --   --   --   --   --   --   --   --    < > = values in this interval not displayed.   Recent Labs    02/24/19 1020 07/17/19 2150  11/25/19 0000 12/09/19 0000 02/08/20 1827  AST $Re'16 17 14  'ivR$ --  34  ALT $Re'13 13 15  'toj$ --  43  ALKPHOS 125 141* 140* 149* 161*  BILITOT 0.6 0.8  --   --  0.6  PROT 7.8 7.6  --   --  7.0  ALBUMIN 3.4* 3.4* 2.9*  --  2.4*   Recent Labs    07/19/19 0219 07/20/19 0723 08/02/19 0000 01/26/20 0000 01/30/20 0000 02/08/20 1827  WBC 16.1* 9.2   < > 9.2 8.0 9.4  NEUTROABS  --   --    < > 5.60 4.50 5.6  HGB 10.3* 10.3*   < > 11.1* 11.5* 13.2  HCT 32.2* 32.4*   < > 34* 35* 40.8  MCV 93.9 93.9  --   --   --  92.9  PLT 184 193   < > 190 197 340   < > = values in this interval not displayed.   Lab Results  Component Value Date   TSH 4.030 02/08/2020   Lab Results  Component Value Date   HGBA1C 6.3 (H) 01/04/2020   Lab Results  Component Value Date   CHOL 99 02/07/2019   HDL 26 (L) 02/07/2019   LDLCALC 50 02/07/2019   TRIG 117 02/07/2019   CHOLHDL 3.8 02/07/2019    Significant Diagnostic Results in last 30 days:  DG Chest 2 View  Result Date: 02/08/2020 CLINICAL  DATA:  Weakness. EXAM: CHEST - 2 VIEW COMPARISON:  Radiograph 07/21/2019 FINDINGS: Post TAVR. Chronic cardiomegaly. Unchanged mediastinal contours. Aortic tortuosity and atherosclerosis. Post median sternotomy. Previous right-sided pacemaker has been removed. No focal airspace disease, pulmonary edema, pleural effusion, or pneumothorax. No acute osseous abnormalities are seen. Multiple overlying monitoring devices in place. IMPRESSION: 1. No acute findings. 2. Chronic cardiomegaly, aortic tortuosity and atherosclerosis. Previous TAVR. Electronically Signed   By: Keith Rake M.D.   On: 02/08/2020 18:21   DG Foot Complete Right  Result Date: 02/08/2020 CLINICAL DATA:  Weakness EXAM: RIGHT FOOT COMPLETE - 3+ VIEW COMPARISON:  11/24/2019 FINDINGS: Osteopenia limits the exam. Vascular calcifications. Ulcer at the tip of the great toe. Progressed bony destructive change involving the distal phalanx suspect for osteomyelitis.  Sclerosis and deformity of the second proximal phalanx probably due to remote fracture. Bunion at the first metatarsal head with degenerative changes at the MTP joint. IMPRESSION: 1. Progressed bony destructive change involving the distal phalanx of the great toe, suspect for osteomyelitis. Overlying ulcer at the tip of the great toe. 2. Suspect chronic fracture deformity of the second proximal phalanx Electronically Signed   By: Donavan Foil M.D.   On: 02/08/2020 18:23    Assessment/Plan  1. Right foot pain -  Will start on Neurontin 100 mg Q HS  2. Type 2 diabetes mellitus with diabetic neuropathy, with long-term current use of insulin (HCC) Lab Results  Component Value Date   HGBA1C 6.3 (H) 01/04/2020   -Continue Xultophy 14 units daily and CBG checks  3. Major depression, recurrent chronic (HCC) -Will taper off Paxil, starting with Paxil 20 mg daily x3 days then 10 mg daily x3 days then DC then will start on Cymbalta 30 mg daily which will help with depression and address pain as well     Family/ staff Communication: Discussed plan of care with resident and charge nurse.  Labs/tests ordered: None  Goals of care:   Long-term care   Durenda Age, DNP, MSN, FNP-BC [facility] [phone] (Monday-Friday 8:00 a.m. - 5:00 p.m.) [phone] (after hours)

## 2020-02-09 NOTE — ED Notes (Signed)
Attempted report x2 to Claudie Leach, RN

## 2020-02-13 ENCOUNTER — Non-Acute Institutional Stay (SKILLED_NURSING_FACILITY): Payer: Medicare Other | Admitting: Adult Health

## 2020-02-13 ENCOUNTER — Encounter: Payer: Self-pay | Admitting: Adult Health

## 2020-02-13 DIAGNOSIS — Z7189 Other specified counseling: Secondary | ICD-10-CM

## 2020-02-13 DIAGNOSIS — F339 Major depressive disorder, recurrent, unspecified: Secondary | ICD-10-CM

## 2020-02-13 DIAGNOSIS — N1832 Chronic kidney disease, stage 3b: Secondary | ICD-10-CM

## 2020-02-13 DIAGNOSIS — E1140 Type 2 diabetes mellitus with diabetic neuropathy, unspecified: Secondary | ICD-10-CM | POA: Diagnosis not present

## 2020-02-13 DIAGNOSIS — M869 Osteomyelitis, unspecified: Secondary | ICD-10-CM | POA: Diagnosis not present

## 2020-02-13 DIAGNOSIS — R531 Weakness: Secondary | ICD-10-CM

## 2020-02-13 DIAGNOSIS — Z794 Long term (current) use of insulin: Secondary | ICD-10-CM

## 2020-02-13 DIAGNOSIS — G629 Polyneuropathy, unspecified: Secondary | ICD-10-CM

## 2020-02-13 LAB — BASIC METABOLIC PANEL
BUN: 106 — AB (ref 4–21)
CO2: 22 (ref 13–22)
Chloride: 97 — AB (ref 99–108)
Creatinine: 1.9 — AB (ref 0.5–1.1)
Glucose: 105
Potassium: 4.4 (ref 3.4–5.3)
Sodium: 134 — AB (ref 137–147)

## 2020-02-13 LAB — COMPREHENSIVE METABOLIC PANEL
Albumin: 2.9 — AB (ref 3.5–5.0)
Calcium: 9 (ref 8.7–10.7)
GFR calc Af Amer: 28.78
GFR calc non Af Amer: 24.83
Globulin: 3.5

## 2020-02-13 LAB — HEPATIC FUNCTION PANEL
ALT: 49 — AB (ref 7–35)
AST: 51 — AB (ref 13–35)
Alkaline Phosphatase: 222 — AB (ref 25–125)
Bilirubin, Total: 0.3

## 2020-02-13 NOTE — Progress Notes (Signed)
Location:  [facility] Room Number: 217-A Place of Service:  SNF (31) Provider:  Kenard Gower, DNP, FNP-BC  Patient Care Team: Pecola Lawless, MD as PCP - General (Internal Medicine) Lars Masson, MD as PCP - Cardiology (Cardiology) Regan Lemming, MD as PCP - Electrophysiology (Cardiology) Medina-Vargas, Margit Banda, NP as Nurse Practitioner (Internal Medicine) Rehab, [facility]  Extended Emergency Contact Information Primary Emergency Contact: Malen Gauze [place] Home Phone: [phone] Mobile Phone: [phone] Relation: Daughter Secondary Emergency Contact: Armstrong,[name]  [place] Home Phone: [phone] Relation: Daughter  Code Status:  FULL CODE  Goals of care: Advanced Directive information Advanced Directives 02/08/2020  Does Patient Have a Medical Advance Directive? No  Type of Advance Directive -  Does patient want to make changes to medical advance directive? -  Copy of Healthcare Power of Attorney in Chart? -  Would patient like information on creating a medical advance directive? No - Patient declined  Pre-existing out of facility DNR order (yellow form or pink MOST form) -     Chief Complaint  Patient presents with  . Advanced Directive    Patient is seen for a Care Plan Meeting    HPI:  Pt is a 74 y.o. female seen today for a care plan meeting.  She is a long-term care resident of [facility].  She has a PMH of chronic kidney disease, CAD, severe aortic stenosis, history of breast cancer, essential hypertension, dyslipidemia, stroke and chronic diastolic congestive heart failure. The meeting was attended by her 4 daughters (including the RP), Child psychotherapist, MDS coordinator, Cytogeneticist and NP. The meeting was held via zoom.  She remains to be full code. She eats 0-25% of her meals with feeding  assistance. She will be included in restorative feeding program. She will have cuing during feedings. She was recently discharged from speech therapy on 12/16 and PT on 12/8. She prefers to stay in bed and RP wants her to be encouraged to get out of bed. She is needing a mechanical lift for transfers. She was recommended to have OT evaluation. Discussed medications, vital signs and weights. Answered medical questions of family. The meeting lasted for 30 minutes.   Past Medical History:  Diagnosis Date  . Acute on chronic diastolic CHF (congestive heart failure) (HCC) 06/22/2017  . Acute on chronic renal failure (HCC) 05/25/2011  . Adenocarcinoma of breast (HCC) 1996   Completed tamoxifen and had mastectomy.  . Aortic stenosis, severe    s/p aortic valve replacement with porcine valve 06/2004.  ECHO 2010 EF 65%, LVH, diastolic dysfxn, Bioprostetic aoritc valve, mild AS. ECHO 2013 EF 60%, Nl aortic artificial valve, dynamic obstruction in the outflow tract   Class IIb rec for annual TTE after 5 yrs. She had a TTE 2013    . Arthritis   . CAD (coronary artery disease) 2006   s/p CABG (5/06) w/ saphenous vein to RCA at time of AVR  . CKD (chronic kidney disease) stage 2, GFR 60-89 ml/min 06/03/2011   She is no longer taking NSAIDs.   Marland Kitchen CVA (cerebral infarction) 2006   Post-op from AVR. Presumed embolic in nature. Carotid stenosis of R 60-79%. Repeat dopplers 4/10 no R stenosis and L stenosis of 1-29%.  . Depression    Controlled on Paxil  . Diabetes mellitus 1992   Dx 04/25/1990. Now insulin dependent, started 2008. On ACEI.   . Diverticulosis  2001  . History of 2019 novel coronavirus disease (COVID-19)   . Hyperlipidemia    Mgmt with a statin  . Hypertension    Requires 4 drug tx  . Osteoporosis 2006   DEXA 10/06 : L femur T -2.8, R -2.7. Lumbar T -2.4. On bisphosphonates and  Calcium / Vit D.  . Peripheral vascular disease ([place])   . Presence of permanent cardiac pacemaker   . Refusal of blood  transfusions as patient is Jehovah's Witness   . Stroke [facility])    Past Surgical History:  Procedure Laterality Date  . ABDOMINAL HYSTERECTOMY  1987   for fibroids  . AORTIC VALVE REPLACEMENT  2006  . CHOLECYSTECTOMY    . CORONARY ARTERY BYPASS GRAFT  2006   Saphenous vein to RCA at time of AVR. Course complicated by acute respiratory failure, post-op PTX, ARI, ileus, CVA  . LOWER EXTREMITY ANGIOGRAPHY N/A 01/04/2020   Procedure: LOWER EXTREMITY ANGIOGRAPHY - Right;  Surgeon: Cherre Robins, MD;  Location: [place] CV LAB;  Service: Cardiovascular;  Laterality: N/A;  . MASTECTOMY Left 1995   L for adenocarcinoma  . PACEMAKER IMPLANT N/A 02/09/2019   Procedure: PACEMAKER IMPLANT;  Surgeon: Constance Haw, MD;  Location: [place] CV LAB;  Service: Cardiovascular;  Laterality: N/A;  . PERIPHERAL VASCULAR BALLOON ANGIOPLASTY  01/04/2020   Procedure: PERIPHERAL VASCULAR BALLOON ANGIOPLASTY;  Surgeon: Cherre Robins, MD;  Location: [place] CV LAB;  Service: Cardiovascular;;  Rt AT  . RIGHT HEART CATH AND CORONARY/GRAFT ANGIOGRAPHY N/A 02/09/2019   Procedure: RIGHT HEART CATH AND CORONARY/GRAFT ANGIOGRAPHY;  Surgeon: Sherren Mocha, MD;  Location: [place] CV LAB;  Service: Cardiovascular;  Laterality: N/A;  . TEE WITHOUT CARDIOVERSION N/A 07/20/2019   Procedure: TRANSESOPHAGEAL ECHOCARDIOGRAM (TEE);  Surgeon: Jerline Pain, MD;  Location: [facility] ENDOSCOPY;  Service: Cardiovascular;  Laterality: N/A;  . TRANSCATHETER AORTIC VALVE REPLACEMENT, TRANSFEMORAL N/A 03/01/2019   Procedure: TRANSCATHETER AORTIC VALVE REPLACEMENT, TRANSFEMORAL;  Surgeon: Sherren Mocha, MD;  Location: [place] CV LAB;  Service: Open Heart Surgery;  Laterality: N/A;    Allergies  Allergen Reactions  . 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," as the patient is a Jehovah's Witness    Outpatient Encounter Medications as of 02/13/2020  Medication Sig  . acetaminophen (TYLENOL) 325 MG  tablet Take 650 mg by mouth every 6 (six) hours as needed. Notify MD id not relieved (Not to exceed 3000 mg ) in 24 hour per stranding order  . acetaminophen (TYLENOL) 325 MG tablet Take 650 mg by mouth in the morning.  . Amino Acids-Protein Hydrolys (FEEDING SUPPLEMENT, PRO-STAT SUGAR FREE 64,) LIQD Take 30 mLs by mouth in the morning and at bedtime.  Marland Kitchen amLODipine (NORVASC) 5 MG tablet Take 1 tablet (5 mg total) by mouth daily.  Marland Kitchen amoxicillin (AMOXIL) 500 MG capsule Take 2,000 mg by mouth as needed. Prior to dental procedure  . aspirin 81 MG EC tablet TAKE 1 TABLET (81 MG TOTAL) BY MOUTH DAILY. SWALLOW WHOLE.  Marland Kitchen atorvastatin (LIPITOR) 80 MG tablet Take 1 tablet (80 mg total) by mouth at bedtime.  . clopidogrel (PLAVIX) 75 MG tablet Take 1 tablet (75 mg total) by mouth daily with breakfast.  . diclofenac Sodium (VOLTAREN) 1 % GEL Apply 4 g topically 4 (four) times daily as needed (Pain). Apply to bilateral hips and knees  . doxycycline (VIBRA-TABS) 100 MG tablet Take 100 mg by mouth 2 (two) times daily.  Derrill Memo ON  02/16/2020] DULoxetine (CYMBALTA) 30 MG capsule Take 30 mg by mouth daily.  . Ensure (ENSURE) Take 237 mLs by mouth in the morning and at bedtime. Strawberry if available  . furosemide (LASIX) 80 MG tablet Take 80 mg by mouth 2 (two) times daily.  Marland Kitchen gabapentin (NEURONTIN) 100 MG capsule Take 100 mg by mouth at bedtime.  . hydrALAZINE (APRESOLINE) 50 MG tablet Take 1 tablet (50 mg total) by mouth 3 (three) times daily.  . Insulin Degludec-Liraglutide (XULTOPHY) 100-3.6 UNIT-MG/ML SOPN Inject 14 Units into the skin daily.  Marland Kitchen lactulose (CHRONULAC) 10 GM/15ML solution Take 30 g by mouth 2 (two) times daily.  . metoprolol succinate (TOPROL-XL) 25 MG 24 hr tablet Take 1 tablet (25 mg total) by mouth daily.  . Multiple Vitamins-Minerals (DECUBI-VITE) CAPS Take 1 capsule by mouth daily.  Marland Kitchen PARoxetine (PAXIL) 30 MG tablet TAKE 1 TABLET BY MOUTH DAILY  . potassium chloride SA (KLOR-CON) 20 MEQ  tablet Take 20 mEq by mouth daily.   Marland Kitchen saccharomyces boulardii (FLORASTOR) 250 MG capsule Take 250 mg by mouth 2 (two) times daily.   No facility-administered encounter medications on file as of 02/13/2020.    Review of Systems  GENERAL: Poor appetite, no fever, chills or weakness MOUTH and THROAT: Denies oral discomfort, gingival pain or bleeding RESPIRATORY: no cough, SOB, DOE, wheezing, hemoptysis CARDIAC: No chest pain, edema or palpitations GI: No abdominal pain, diarrhea, constipation, heart burn, nausea or vomiting GU: Denies dysuria, frequency, hematuria or discharge NEUROLOGICAL: Denies dizziness, syncope, numbness, or headache PSYCHIATRIC: Denies feelings of depression or anxiety. No report of hallucinations, insomnia, paranoia, or agitation   Immunization History  Administered Date(s) Administered  . Fluad Quad(high Dose 65+) 02/11/2019  . Influenza Split 11/12/2010, 11/10/2011  . Influenza Whole 11/07/2009  . Influenza,inj,Quad PF,6+ Mos 10/19/2012, 01/05/2014, 11/08/2015, 01/29/2017  . Influenza-Unspecified 12/15/2019  . Moderna Sars-Covid-2 Vaccination 03/09/2019, 04/06/2019  . Pneumococcal Conjugate-13 06/07/2014  . Pneumococcal Polysaccharide-23 12/26/1994, 03/05/2010  . Td 09/24/1993  . Tdap 11/12/2010, 07/12/2013   Pertinent  Health Maintenance Due  Topic Date Due  . LIPID PANEL  02/07/2020  . FOOT EXAM  02/24/2020 (Originally 07/10/2017)  . OPHTHALMOLOGY EXAM  02/24/2020 (Originally 11/07/2016)  . HEMOGLOBIN A1C  04/05/2020  . INFLUENZA VACCINE  Completed  . DEXA SCAN  Completed  . PNA vac Low Risk Adult  Completed   Fall Risk  01/10/2020 08/02/2019 06/16/2019 05/19/2019 05/04/2019  Falls in the past year? $RemoveBe'1 1 1 1 'XIHkTKTbF$ 0  Comment - - pt denies-but dtr stated pt had a fall in last mths pt denies falls-but dtr states pt has had a fall -  Number falls in past yr: 1 1 0 0 -  Injury with Fall? 0 0 0 0 -  Risk Factor Category  - - - - -  Risk for fall due to : History  of fall(s);Impaired balance/gait Impaired mobility History of fall(s);Impaired balance/gait;Impaired mobility;Medication side effect History of fall(s);Impaired balance/gait;Impaired mobility;Medication side effect No Fall Risks  Risk for fall due to: Comment - - - - -  Follow up Falls evaluation completed;Education provided;Falls prevention discussed Falls evaluation completed Falls evaluation completed;Falls prevention discussed Falls evaluation completed Falls evaluation completed     Vitals:   02/13/20 1514  BP: 107/68  Pulse: 71  Resp: 16  Temp: (!) 97.2 F (36.2 C)  TempSrc: Oral  Weight: 146 lb 6.4 oz (66.4 kg)  Height: $Remove'5\' 5"'XjDQxCZ$  (1.651 m)   Body mass index is 24.36 kg/m.  Physical Exam  GENERAL APPEARANCE: Well nourished. In no acute distress. Normal body habitus SKIN:  Bilateral heel wounds, right great toe wound and Left sacral area stage 2 wound, all with dressing MOUTH and THROAT: Lips are without lesions. Oral mucosa is moist and without lesions. Tongue is normal in shape, size, and color and without lesions RESPIRATORY: Breathing is even & unlabored, BS CTAB CARDIAC: RRR, no murmur,no extra heart sounds, no edema GI: Abdomen soft, normal BS, no masses, no tenderness NEUROLOGICAL: There is no tremor. Speech is clear. Alert to self and time, disoriented to place. PSYCHIATRIC:  Affect and behavior are appropriate  Labs reviewed: Recent Labs    03/02/19 0253 05/04/19 1144 07/20/19 0301 07/21/19 0356 08/23/19 1701 09/20/19 0000 01/04/20 1222 01/13/20 0000 01/17/20 0000 01/25/20 0000 02/08/20 1827  NA 140   < > 137   < > 143   < > 139   < > 137 138 139  K 3.7   < > 3.6   < > 4.4   < > 4.5   < > 4.3 4.7 4.5  CL 105   < > 98   < > 102   < > 107   < > 103 101 104  CO2 26   < > 27   < > 25   < >  --    < > 23* 27* 23  GLUCOSE 115*   < > 239*   < > 121*  --  109*  --   --   --  81  BUN 24*   < > 26*   < > 44*   < > 56*   < > 80* 64* 115*  CREATININE 1.47*   < > 1.56*    < > 1.62*   < > 2.80*   < > 2.0* 1.8* 2.17*  CALCIUM 8.8*   < > 8.6*   < > 9.4   < >  --    < > 8.7 8.7 9.3  MG 1.7  --  1.8  --   --   --   --   --   --   --   --    < > = values in this interval not displayed.   Recent Labs    02/24/19 1020 07/17/19 2150 11/25/19 0000 12/09/19 0000 02/08/20 1827  AST $Re'16 17 14  'CCy$ --  34  ALT $Re'13 13 15  'IFI$ --  43  ALKPHOS 125 141* 140* 149* 161*  BILITOT 0.6 0.8  --   --  0.6  PROT 7.8 7.6  --   --  7.0  ALBUMIN 3.4* 3.4* 2.9*  --  2.4*   Recent Labs    07/19/19 0219 07/20/19 0723 08/02/19 0000 01/26/20 0000 01/30/20 0000 02/08/20 1827  WBC 16.1* 9.2   < > 9.2 8.0 9.4  NEUTROABS  --   --    < > 5.60 4.50 5.6  HGB 10.3* 10.3*   < > 11.1* 11.5* 13.2  HCT 32.2* 32.4*   < > 34* 35* 40.8  MCV 93.9 93.9  --   --   --  92.9  PLT 184 193   < > 190 197 340   < > = values in this interval not displayed.   Lab Results  Component Value Date   TSH 4.030 02/08/2020   Lab Results  Component Value Date   HGBA1C 6.3 (H) 01/04/2020   Lab Results  Component Value Date   CHOL 99 02/07/2019  HDL 26 (L) 02/07/2019   LDLCALC 50 02/07/2019   TRIG 117 02/07/2019   CHOLHDL 3.8 02/07/2019    Significant Diagnostic Results in last 30 days:  DG Chest 2 View  Result Date: 02/08/2020 CLINICAL DATA:  Weakness. EXAM: CHEST - 2 VIEW COMPARISON:  Radiograph 07/21/2019 FINDINGS: Post TAVR. Chronic cardiomegaly. Unchanged mediastinal contours. Aortic tortuosity and atherosclerosis. Post median sternotomy. Previous right-sided pacemaker has been removed. No focal airspace disease, pulmonary edema, pleural effusion, or pneumothorax. No acute osseous abnormalities are seen. Multiple overlying monitoring devices in place. IMPRESSION: 1. No acute findings. 2. Chronic cardiomegaly, aortic tortuosity and atherosclerosis. Previous TAVR. Electronically Signed   By: Keith Rake M.D.   On: 02/08/2020 18:21   DG Foot Complete Right  Result Date: 02/08/2020 CLINICAL  DATA:  Weakness EXAM: RIGHT FOOT COMPLETE - 3+ VIEW COMPARISON:  11/24/2019 FINDINGS: Osteopenia limits the exam. Vascular calcifications. Ulcer at the tip of the great toe. Progressed bony destructive change involving the distal phalanx suspect for osteomyelitis. Sclerosis and deformity of the second proximal phalanx probably due to remote fracture. Bunion at the first metatarsal head with degenerative changes at the MTP joint. IMPRESSION: 1. Progressed bony destructive change involving the distal phalanx of the great toe, suspect for osteomyelitis. Overlying ulcer at the tip of the great toe. 2. Suspect chronic fracture deformity of the second proximal phalanx Electronically Signed   By: Donavan Foil M.D.   On: 02/08/2020 18:23    Assessment/Plan  1. Advance care planning -  Remains to be full code -  Discussed medications, vital signs and weights  2. Osteomyelitis of great toe of right foot (HCC) -  Continue Doxycycline and Florastor -  Continue wound treatment and follows up with podiatry/foot center  3. Type 2 diabetes mellitus with diabetic neuropathy, with long-term current use of insulin (HCC) Lab Results  Component Value Date   HGBA1C 6.3 (H) 01/04/2020   -Continue Xultophy and CBG checks  4. Neuropathy -Improved, continue Neurontin  5. Stage 3b chronic kidney disease ([place]) - 01/25/20 creatinine 1.77, BUN 64.4, GFR 31.77 -  Will monitor   6. Major depression, recurrent, chronic (HCC) -Paxil is being tapered off then will start on Cymbalta 30 mg daily -Followed up by psych NP  7. Generalized weakness -  Recommended to have OT evaluation and restorative feeding -  Continue to use mechanical lift   Family/ staff Communication:  Discussed plan of care with daughters and IDT.  Labs/tests ordered:   BMP  Goals of care:   Long-term care   Durenda Age, DNP, MSN, FNP-BC [facility] [phone] (Monday-Friday 8:00 a.m. - 5:00  p.m.) [phone] (after hours)

## 2020-02-15 LAB — BASIC METABOLIC PANEL
BUN: 101 — AB (ref 4–21)
CO2: 22 (ref 13–22)
Chloride: 101 (ref 99–108)
Creatinine: 1.6 — AB (ref 0.5–1.1)
Glucose: 104
Potassium: 4.1 (ref 3.4–5.3)
Sodium: 136 — AB (ref 137–147)

## 2020-02-15 LAB — COMPREHENSIVE METABOLIC PANEL
Calcium: 8.7 (ref 8.7–10.7)
GFR calc Af Amer: 35.08
GFR calc non Af Amer: 30.27

## 2020-02-16 ENCOUNTER — Ambulatory Visit (INDEPENDENT_AMBULATORY_CARE_PROVIDER_SITE_OTHER): Payer: Medicare Other | Admitting: Sports Medicine

## 2020-02-16 ENCOUNTER — Encounter: Payer: Self-pay | Admitting: Adult Health

## 2020-02-16 ENCOUNTER — Other Ambulatory Visit: Payer: Self-pay

## 2020-02-16 ENCOUNTER — Non-Acute Institutional Stay (SKILLED_NURSING_FACILITY): Payer: Medicare Other | Admitting: Adult Health

## 2020-02-16 DIAGNOSIS — Z794 Long term (current) use of insulin: Secondary | ICD-10-CM

## 2020-02-16 DIAGNOSIS — I5032 Chronic diastolic (congestive) heart failure: Secondary | ICD-10-CM

## 2020-02-16 DIAGNOSIS — L97511 Non-pressure chronic ulcer of other part of right foot limited to breakdown of skin: Secondary | ICD-10-CM | POA: Diagnosis not present

## 2020-02-16 DIAGNOSIS — E0800 Diabetes mellitus due to underlying condition with hyperosmolarity without nonketotic hyperglycemic-hyperosmolar coma (NKHHC): Secondary | ICD-10-CM

## 2020-02-16 DIAGNOSIS — L89623 Pressure ulcer of left heel, stage 3: Secondary | ICD-10-CM

## 2020-02-16 DIAGNOSIS — I7025 Atherosclerosis of native arteries of other extremities with ulceration: Secondary | ICD-10-CM | POA: Diagnosis not present

## 2020-02-16 DIAGNOSIS — L89610 Pressure ulcer of right heel, unstageable: Secondary | ICD-10-CM

## 2020-02-16 DIAGNOSIS — N1832 Chronic kidney disease, stage 3b: Secondary | ICD-10-CM

## 2020-02-16 DIAGNOSIS — M869 Osteomyelitis, unspecified: Secondary | ICD-10-CM

## 2020-02-16 DIAGNOSIS — E1140 Type 2 diabetes mellitus with diabetic neuropathy, unspecified: Secondary | ICD-10-CM | POA: Diagnosis not present

## 2020-02-16 DIAGNOSIS — I70229 Atherosclerosis of native arteries of extremities with rest pain, unspecified extremity: Secondary | ICD-10-CM

## 2020-02-16 DIAGNOSIS — G629 Polyneuropathy, unspecified: Secondary | ICD-10-CM

## 2020-02-16 DIAGNOSIS — F339 Major depressive disorder, recurrent, unspecified: Secondary | ICD-10-CM

## 2020-02-16 DIAGNOSIS — I739 Peripheral vascular disease, unspecified: Secondary | ICD-10-CM

## 2020-02-16 NOTE — Progress Notes (Signed)
Location:  [facility] Room Number: 217-A Place of Service:  SNF (31) Provider:  Durenda Age, DNP, FNP-BC  Patient Care Team: Hendricks Limes, MD as PCP - General (Internal Medicine) Dorothy Spark, MD as PCP - Cardiology (Cardiology) Constance Haw, MD as PCP - Electrophysiology (Cardiology) Medina-Vargas, Senaida Lange, NP as Nurse Practitioner (Internal Medicine) Rehab, [facility] ([place])  Extended Emergency Contact Information Primary Emergency Contact: Lieutenant [place] Phone: [phone] Mobile Phone: [phone] Relation: Daughter Secondary Emergency Contact: Armstrong,[name]  [place] Phone: [phone] Relation: Daughter  Code Status:  FULL CODE  Goals of care: Advanced Directive information Advanced Directives 02/08/2020  Does Patient Have a Medical Advance Directive? No  Type of Advance Directive -  Does patient want to make changes to medical advance directive? -  Copy of [place] in Chart? -  Would patient like information on creating a medical advance directive? No - Patient declined  Pre-existing out of facility DNR order (yellow form or pink MOST form) -     Chief Complaint  Patient presents with  . Medical Management of Chronic Issues    Routine [facility] visit    HPI:  Pt is a 74 y.o. female seen today for medical management of chronic diseases.  She is a long-term care resident of [facility].  She has a PMH of chronic kidney disease, CAD, severe aortic stenosis, history of breast cancer, essential hypertension, dyslipidemia, stroke and chronic systolic CHF. She has poor appetite and eats 0-25% of her meals. She was given IV fluids NS 1L yesterday. Latest labs : creatinine 1.63, improved from 1.92, BUN 100.9, improved from 105.6 and GRF 35.08, improved from 28.78.  She continues to take  Doxycycline 100 mg BID for right great toe osteomyelitis. No SOB nor edema. She takes Furosemide 80 mg BID for chronic diastolic CHF. CBG ranging from 78 to 200. She takes Xultophy 14 units daily for diabetes mellitus.    Past Medical History:  Diagnosis Date  . Acute on chronic diastolic CHF (congestive heart failure) ([place]) 06/22/2017  . Acute on chronic renal failure ([place]) 05/25/2011  . Adenocarcinoma of breast ([place]) 1996   Completed tamoxifen and had mastectomy.  . Aortic stenosis, severe    s/p aortic valve replacement with porcine valve 06/2004.  ECHO 2010 EF 54%, LVH, diastolic dysfxn, Bioprostetic aoritc valve, mild AS. ECHO 2013 EF 60%, Nl aortic artificial valve, dynamic obstruction in the outflow tract   Class IIb rec for annual TTE after 5 yrs. She had a TTE 2013    . Arthritis   . CAD (coronary artery disease) 2006   s/p CABG (5/06) w/ saphenous vein to RCA at time of AVR  . CKD (chronic kidney disease) stage 2, GFR 60-89 ml/min 06/03/2011   She is no longer taking NSAIDs.   Marland Kitchen CVA (cerebral infarction) 2006   Post-op from AVR. Presumed embolic in nature. Carotid stenosis of R 60-79%. Repeat dopplers 4/10 no R stenosis and L stenosis of 1-29%.  . Depression    Controlled on Paxil  . Diabetes mellitus 1992   Dx 04/25/1990. Now insulin dependent, started 2008. On ACEI.   . Diverticulosis 2001  . History of 2019 novel coronavirus disease (COVID-19)   . Hyperlipidemia    Mgmt with a statin  . Hypertension    Requires 4 drug tx  . Osteoporosis 2006   DEXA 10/06 :  L femur T -2.8, R -2.7. Lumbar T -2.4. On bisphosphonates and  Calcium / Vit D.  . Peripheral vascular disease ([place])   . Presence of permanent cardiac pacemaker   . Refusal of blood transfusions as patient is Jehovah's Witness   . Stroke [facility])    Past Surgical History:  Procedure Laterality Date  . ABDOMINAL HYSTERECTOMY  1987   for fibroids  . AORTIC VALVE REPLACEMENT  2006  . CHOLECYSTECTOMY    . CORONARY ARTERY  BYPASS GRAFT  2006   Saphenous vein to RCA at time of AVR. Course complicated by acute respiratory failure, post-op PTX, ARI, ileus, CVA  . LOWER EXTREMITY ANGIOGRAPHY N/A 01/04/2020   Procedure: LOWER EXTREMITY ANGIOGRAPHY - Right;  Surgeon: Cherre Robins, MD;  Location: [place] CV LAB;  Service: Cardiovascular;  Laterality: N/A;  . MASTECTOMY Left 1995   L for adenocarcinoma  . PACEMAKER IMPLANT N/A 02/09/2019   Procedure: PACEMAKER IMPLANT;  Surgeon: Constance Haw, MD;  Location: [place] CV LAB;  Service: Cardiovascular;  Laterality: N/A;  . PERIPHERAL VASCULAR BALLOON ANGIOPLASTY  01/04/2020   Procedure: PERIPHERAL VASCULAR BALLOON ANGIOPLASTY;  Surgeon: Cherre Robins, MD;  Location: [place] CV LAB;  Service: Cardiovascular;;  Rt AT  . RIGHT HEART CATH AND CORONARY/GRAFT ANGIOGRAPHY N/A 02/09/2019   Procedure: RIGHT HEART CATH AND CORONARY/GRAFT ANGIOGRAPHY;  Surgeon: Sherren Mocha, MD;  Location: [place] CV LAB;  Service: Cardiovascular;  Laterality: N/A;  . TEE WITHOUT CARDIOVERSION N/A 07/20/2019   Procedure: TRANSESOPHAGEAL ECHOCARDIOGRAM (TEE);  Surgeon: Jerline Pain, MD;  Location: [facility] ENDOSCOPY;  Service: Cardiovascular;  Laterality: N/A;  . TRANSCATHETER AORTIC VALVE REPLACEMENT, TRANSFEMORAL N/A 03/01/2019   Procedure: TRANSCATHETER AORTIC VALVE REPLACEMENT, TRANSFEMORAL;  Surgeon: Sherren Mocha, MD;  Location: [place] CV LAB;  Service: Open Heart Surgery;  Laterality: N/A;    Allergies  Allergen Reactions  . 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," as the patient is a Jehovah's Witness    Outpatient Encounter Medications as of 02/16/2020  Medication Sig  . acetaminophen (TYLENOL) 325 MG tablet Take 650 mg by mouth every 6 (six) hours as needed. Notify MD id not relieved (Not to exceed 3000 mg ) in 24 hour per stranding order  . acetaminophen (TYLENOL) 325 MG tablet Take 650 mg by mouth in the morning.  . Amino Acids-Protein  Hydrolys (FEEDING SUPPLEMENT, PRO-STAT SUGAR FREE 64,) LIQD Take 30 mLs by mouth in the morning and at bedtime.  Marland Kitchen amLODipine (NORVASC) 5 MG tablet Take 1 tablet (5 mg total) by mouth daily.  Marland Kitchen amoxicillin (AMOXIL) 500 MG capsule Take 2,000 mg by mouth as needed. Prior to dental procedure  . aspirin 81 MG EC tablet TAKE 1 TABLET (81 MG TOTAL) BY MOUTH DAILY. SWALLOW WHOLE.  Marland Kitchen atorvastatin (LIPITOR) 80 MG tablet Take 1 tablet (80 mg total) by mouth at bedtime.  . clopidogrel (PLAVIX) 75 MG tablet Take 1 tablet (75 mg total) by mouth daily with breakfast.  . diclofenac Sodium (VOLTAREN) 1 % GEL Apply 4 g topically 4 (four) times daily as needed (Pain). Apply to bilateral hips and knees  . doxycycline (VIBRA-TABS) 100 MG tablet Take 100 mg by mouth 2 (two) times daily.  . DULoxetine (CYMBALTA) 30 MG capsule Take 30 mg by mouth daily.  . Ensure (ENSURE) Take 237 mLs by mouth in the morning and at bedtime. Strawberry if available  . furosemide (LASIX) 80 MG tablet Take 80 mg by mouth 2 (  two) times daily.  Marland Kitchen gabapentin (NEURONTIN) 100 MG capsule Take 100 mg by mouth at bedtime.  . hydrALAZINE (APRESOLINE) 50 MG tablet Take 1 tablet (50 mg total) by mouth 3 (three) times daily.  . Insulin Degludec-Liraglutide (XULTOPHY) 100-3.6 UNIT-MG/ML SOPN Inject 14 Units into the skin daily.  Marland Kitchen lactulose (CHRONULAC) 10 GM/15ML solution Take 30 g by mouth 2 (two) times daily.  . metoprolol succinate (TOPROL-XL) 25 MG 24 hr tablet Take 1 tablet (25 mg total) by mouth daily.  . Multiple Vitamins-Minerals (DECUBI-VITE) CAPS Take 1 capsule by mouth daily.  . potassium chloride SA (KLOR-CON) 20 MEQ tablet Take 20 mEq by mouth daily.   Marland Kitchen saccharomyces boulardii (FLORASTOR) 250 MG capsule Take 250 mg by mouth 2 (two) times daily.  . [DISCONTINUED] PARoxetine (PAXIL) 30 MG tablet TAKE 1 TABLET BY MOUTH DAILY   No facility-administered encounter medications on file as of 02/16/2020.    Review of Systems  GENERAL:  Poor  appetite, no fever or chills  MOUTH and THROAT: Denies oral discomfort, gingival pain or bleeding RESPIRATORY: no cough, SOB, DOE, wheezing, hemoptysis CARDIAC: No chest pain, edema or palpitations GI: No abdominal pain, diarrhea, constipation, heart burn, nausea or vomiting GU: Denies dysuria, frequency, hematuria or discharge NEUROLOGICAL: Denies dizziness, syncope, numbness, or headache PSYCHIATRIC: Denies feelings of depression or anxiety. No report of hallucinations, insomnia, paranoia, or agitation   Immunization History  Administered Date(s) Administered  . Fluad Quad(high Dose 65+) 02/11/2019  . Influenza Split 11/12/2010, 11/10/2011  . Influenza Whole 11/07/2009  . Influenza,inj,Quad PF,6+ Mos 10/19/2012, 01/05/2014, 11/08/2015, 01/29/2017  . Influenza-Unspecified 12/15/2019  . Moderna Sars-Covid-2 Vaccination 03/09/2019, 04/06/2019  . Pneumococcal Conjugate-13 06/07/2014  . Pneumococcal Polysaccharide-23 12/26/1994, 03/05/2010  . Td 09/24/1993  . Tdap 11/12/2010, 07/12/2013   Pertinent  Health Maintenance Due  Topic Date Due  . LIPID PANEL  02/07/2020  . FOOT EXAM  02/24/2020 (Originally 07/10/2017)  . OPHTHALMOLOGY EXAM  02/24/2020 (Originally 11/07/2016)  . HEMOGLOBIN A1C  04/05/2020  . INFLUENZA VACCINE  Completed  . DEXA SCAN  Completed  . PNA vac Low Risk Adult  Completed   Fall Risk  01/10/2020 08/02/2019 06/16/2019 05/19/2019 05/04/2019  Falls in the past year? $RemoveBe'1 1 1 1 'cWgkbbUXd$ 0  Comment - - pt denies-but dtr stated pt had a fall in last mths pt denies falls-but dtr states pt has had a fall -  Number falls in past yr: 1 1 0 0 -  Injury with Fall? 0 0 0 0 -  Risk Factor Category  - - - - -  Risk for fall due to : History of fall(s);Impaired balance/gait Impaired mobility History of fall(s);Impaired balance/gait;Impaired mobility;Medication side effect History of fall(s);Impaired balance/gait;Impaired mobility;Medication side effect No Fall Risks  Risk for fall due to: Comment  - - - - -  Follow up Falls evaluation completed;Education provided;Falls prevention discussed Falls evaluation completed Falls evaluation completed;Falls prevention discussed Falls evaluation completed Falls evaluation completed     Vitals:   02/16/20 1038  BP: 125/68  Pulse: 62  Resp: 15  Temp: (!) 97.2 F (36.2 C)  TempSrc: Oral  Weight: 146 lb 6.4 oz (66.4 kg)  Height: $Remove'5\' 5"'yYQUxir$  (1.651 m)   Body mass index is 24.36 kg/m.  Physical Exam  GENERAL APPEARANCE: Well nourished. In no acute distress. Normal body habitus SKIN:  See HPI MOUTH and THROAT: Lips are without lesions. Oral mucosa is moist and without lesions. Tongue is normal in shape, size, and color and without lesions  RESPIRATORY: Breathing is even & unlabored, BS CTAB CARDIAC: RRR, no murmur,no extra heart sounds, no edema GI: Abdomen soft, normal BS, no masses, no tenderness NEUROLOGICAL: There is no tremor. Speech is clear. Alert to self and time, disoriented to place. PSYCHIATRIC:  Affect and behavior are appropriate  Labs reviewed: Recent Labs    03/02/19 0253 05/04/19 1144 07/20/19 0301 07/21/19 0356 08/23/19 1701 09/20/19 0000 01/04/20 1222 01/13/20 0000 02/08/20 1827 02/13/20 0000 02/15/20 0000  NA 140   < > 137   < > 143   < > 139   < > 139 134* 136*  K 3.7   < > 3.6   < > 4.4   < > 4.5   < > 4.5 4.4 4.1  CL 105   < > 98   < > 102   < > 107   < > 104 97* 101  CO2 26   < > 27   < > 25   < >  --    < > $R'23 22 22  'Lz$ GLUCOSE 115*   < > 239*   < > 121*  --  109*  --  81  --   --   BUN 24*   < > 26*   < > 44*   < > 56*   < > 115* 106* 101*  CREATININE 1.47*   < > 1.56*   < > 1.62*   < > 2.80*   < > 2.17* 1.9* 1.6*  CALCIUM 8.8*   < > 8.6*   < > 9.4   < >  --    < > 9.3 9.0 8.7  MG 1.7  --  1.8  --   --   --   --   --   --   --   --    < > = values in this interval not displayed.   Recent Labs    02/24/19 1020 07/17/19 2150 11/25/19 0000 12/09/19 0000 02/08/20 1827 02/13/20 0000  AST $Re'16 17 14  'GIV$ --  34  51*  ALT $Re'13 13 15  'VMt$ --  43 49*  ALKPHOS 125 141* 140* 149* 161* 222*  BILITOT 0.6 0.8  --   --  0.6  --   PROT 7.8 7.6  --   --  7.0  --   ALBUMIN 3.4* 3.4* 2.9*  --  2.4* 2.9*   Recent Labs    07/19/19 0219 07/20/19 0723 08/02/19 0000 01/26/20 0000 01/30/20 0000 02/08/20 1827  WBC 16.1* 9.2   < > 9.2 8.0 9.4  NEUTROABS  --   --    < > 5.60 4.50 5.6  HGB 10.3* 10.3*   < > 11.1* 11.5* 13.2  HCT 32.2* 32.4*   < > 34* 35* 40.8  MCV 93.9 93.9  --   --   --  92.9  PLT 184 193   < > 190 197 340   < > = values in this interval not displayed.   Lab Results  Component Value Date   TSH 4.030 02/08/2020   Lab Results  Component Value Date   HGBA1C 6.3 (H) 01/04/2020   Lab Results  Component Value Date   CHOL 99 02/07/2019   HDL 26 (L) 02/07/2019   LDLCALC 50 02/07/2019   TRIG 117 02/07/2019   CHOLHDL 3.8 02/07/2019    Significant Diagnostic Results in last 30 days:  DG Chest 2 View  Result Date: 02/08/2020 CLINICAL DATA:  Weakness.  EXAM: CHEST - 2 VIEW COMPARISON:  Radiograph 07/21/2019 FINDINGS: Post TAVR. Chronic cardiomegaly. Unchanged mediastinal contours. Aortic tortuosity and atherosclerosis. Post median sternotomy. Previous right-sided pacemaker has been removed. No focal airspace disease, pulmonary edema, pleural effusion, or pneumothorax. No acute osseous abnormalities are seen. Multiple overlying monitoring devices in place. IMPRESSION: 1. No acute findings. 2. Chronic cardiomegaly, aortic tortuosity and atherosclerosis. Previous TAVR. Electronically Signed   By: Keith Rake M.D.   On: 02/08/2020 18:21   DG Foot Complete Right  Result Date: 02/08/2020 CLINICAL DATA:  Weakness EXAM: RIGHT FOOT COMPLETE - 3+ VIEW COMPARISON:  11/24/2019 FINDINGS: Osteopenia limits the exam. Vascular calcifications. Ulcer at the tip of the great toe. Progressed bony destructive change involving the distal phalanx suspect for osteomyelitis. Sclerosis and deformity of the second  proximal phalanx probably due to remote fracture. Bunion at the first metatarsal head with degenerative changes at the MTP joint. IMPRESSION: 1. Progressed bony destructive change involving the distal phalanx of the great toe, suspect for osteomyelitis. Overlying ulcer at the tip of the great toe. 2. Suspect chronic fracture deformity of the second proximal phalanx Electronically Signed   By: Donavan Foil M.D.   On: 02/08/2020 18:23    Assessment/Plan  1. Chronic diastolic congestive heart failure (HCC) -  No SOB, continue furosemide  2. Stage 3b chronic kidney disease ([place]) Lab Results  Component Value Date   NA 136 (A) 02/15/2020   K 4.1 02/15/2020   CO2 22 02/15/2020   GLUCOSE 81 02/08/2020   BUN 101 (A) 02/15/2020   CREATININE 1.6 (A) 02/15/2020   CALCIUM 8.7 02/15/2020   GFRNONAA 30.27 02/15/2020   GFRAA 35.08 02/15/2020    -  S/P IVF NS X 1L A p.o.  3. Type 2 diabetes mellitus with diabetic neuropathy, with long-term current use of insulin (HCC) Lab Results  Component Value Date   HGBA1C 6.3 (H) 01/04/2020   -CBGs is stable, continue Xultophy and CBG checks  4. Osteomyelitis of great toe of right foot ([place]) -   Continue doxycycline and Florastor -   Continue wound treatment  5. Major depression, recurrent, chronic (HCC) -  Has poor appetite, continue supplementations and recently started on Cymbalta after tapering of Paxil -Requested psych consult  6. Neuropathy -Stable, continue Neurontin  7. Critical lower limb ischemia (HCC) - S/P right lower extremity angiography and peripheral balloon angioplasty -   Continue Plavix, aspirin and atorvastatin    Family/ staff Communication:  Discussed plan of care with resident and charge nurse.  Labs/tests ordered:  None  Goals of care:   Long-term care   Durenda Age, DNP, MSN, FNP-BC [facility] [phone] (Monday-Friday 8:00 a.m. - 5:00 p.m.) [phone] (after hours)

## 2020-02-17 ENCOUNTER — Encounter: Payer: Self-pay | Admitting: Sports Medicine

## 2020-02-17 NOTE — Progress Notes (Signed)
Subjective: [name] is a 74 y.o. female patient seen in office for evaluation of ulceration of the right great toe and heel ulcers. Patient denies any current pain. Patient denies nausea vomiting fever chills or any other constitutional symptoms at this time.  Patient Active Problem List   Diagnosis Date Noted  . Bleeding 01/27/2020  . CKD (chronic kidney disease) stage 3, GFR 30-59 ml/min (HCC) 01/27/2020  . Critical lower limb ischemia ([place]) 01/04/2020  . Skin pustule 10/20/2019  . Neurocognitive deficits 08/04/2019  . Controlled type 2 diabetes mellitus without complication ([place]) 46/56/8127  . CVA (cerebral vascular accident) ([place]) 07/23/2019  . Infection of pacemaker lead wire ([place]) 07/21/2019  . Fever   . History of transcatheter aortic valve replacement (TAVR)   . Septic encephalopathy 07/18/2019  . Sacral decubitus ulcer, stage II ([place]) 07/18/2019  . Bacteremia due to group B Streptococcus 07/18/2019  . Sepsis ([place]) 07/18/2019  . Complete heart block ([place])   . Chronic diastolic congestive heart failure ([place]) 02/07/2019  . AKI (acute kidney injury) ([place]) 07/22/2017  . Peripheral arterial disease ([place]) 06/18/2017  . B12 deficiency 05/01/2017  . Bilateral lower extremity edema 11/08/2015  . Aortic atherosclerosis ([place]) 06/07/2014  . Abnormality of gait 05/29/2012  . Constipation 03/16/2012  . HOCM (hypertrophic obstructive cardiomyopathy) ([place]) 06/11/2011  . Routine health maintenance 06/10/2010  . Hyperlipidemia   . Refusal of blood transfusions as patient is Jehovah's Witness   . History of adenocarcinoma of breast   . Osteoporosis   . Status post CVA   . Aortic stenosis s/p Tissure AVR 2006   . CAD (coronary artery disease)   . Hypertension   . Depression 01/23/2006  . DM (diabetes mellitus) ([place]) 03/25/1990  . Benign hypertensive heart and kidney disease with diastolic CHF, NYHA class 3 and CKD stage 3 ([place]) 1992  . Bradycardia 1992   Current Outpatient  Medications on File Prior to Visit  Medication Sig Dispense Refill  . acetaminophen (TYLENOL) 325 MG tablet Take 650 mg by mouth every 6 (six) hours as needed. Notify MD id not relieved (Not to exceed 3000 mg ) in 24 hour per stranding order    . acetaminophen (TYLENOL) 325 MG tablet Take 650 mg by mouth in the morning.    . Amino Acids-Protein Hydrolys (FEEDING SUPPLEMENT, PRO-STAT SUGAR FREE 64,) LIQD Take 30 mLs by mouth in the morning and at bedtime.    Marland Kitchen amLODipine (NORVASC) 5 MG tablet Take 1 tablet (5 mg total) by mouth daily. 90 tablet 3  . amoxicillin (AMOXIL) 500 MG capsule Take 2,000 mg by mouth as needed. Prior to dental procedure    . aspirin 81 MG EC tablet TAKE 1 TABLET (81 MG TOTAL) BY MOUTH DAILY. SWALLOW WHOLE. 30 tablet 3  . atorvastatin (LIPITOR) 80 MG tablet Take 1 tablet (80 mg total) by mouth at bedtime. 90 tablet 3  . clopidogrel (PLAVIX) 75 MG tablet Take 1 tablet (75 mg total) by mouth daily with breakfast. 90 tablet 3  . diclofenac Sodium (VOLTAREN) 1 % GEL Apply 4 g topically 4 (four) times daily as needed (Pain). Apply to bilateral hips and knees    . doxycycline (VIBRA-TABS) 100 MG tablet Take 100 mg by mouth 2 (two) times daily.    . DULoxetine (CYMBALTA) 30 MG capsule Take 30 mg by mouth daily.    . Ensure (ENSURE) Take 237 mLs by mouth in the morning and at bedtime. Strawberry if available    . furosemide (  LASIX) 80 MG tablet Take 80 mg by mouth 2 (two) times daily.    Marland Kitchen gabapentin (NEURONTIN) 100 MG capsule Take 100 mg by mouth at bedtime.    . hydrALAZINE (APRESOLINE) 50 MG tablet Take 1 tablet (50 mg total) by mouth 3 (three) times daily. 270 tablet 0  . Insulin Degludec-Liraglutide (XULTOPHY) 100-3.6 UNIT-MG/ML SOPN Inject 14 Units into the skin daily.    Marland Kitchen lactulose (CHRONULAC) 10 GM/15ML solution Take 30 g by mouth 2 (two) times daily.    . metoprolol succinate (TOPROL-XL) 25 MG 24 hr tablet Take 1 tablet (25 mg total) by mouth daily. 90 tablet 1  . Multiple  Vitamins-Minerals (DECUBI-VITE) CAPS Take 1 capsule by mouth daily.    . potassium chloride SA (KLOR-CON) 20 MEQ tablet Take 20 mEq by mouth daily.     Marland Kitchen saccharomyces boulardii (FLORASTOR) 250 MG capsule Take 250 mg by mouth 2 (two) times daily.     No current facility-administered medications on file prior to visit.   Allergies  Allergen Reactions  . 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," as the patient is a Jehovah's Witness    Recent Results (from the past 2160 hour(s))  WOUND CULTURE     Status: Abnormal   Collection Time: 11/24/19  9:03 AM   Specimen: Wound  Result Value Ref Range   MICRO NUMBER: [id number]    SPECIMEN QUALITY: Adequate    SOURCE: RIGHT HALLUX    STATUS: FINAL    GRAM STAIN:      Moderate White blood cells seen No epithelial cells seen Many Gram positive cocci in pairs Many Gram negative bacilli   ISOLATE 1: Morganella morganii (A)     Comment: Heavy growth of Morganella morganii   ISOLATE 2: Enterobacter cloacae (A)     Comment: Heavy growth of Enterobacter cloacae      Susceptibility   Enterobacter cloacae - AEROBIC CULT, GRAM STAIN NEGATIVE 2    AMOX/CLAVULANIC 8 Resistant     CEFAZOLIN >=64 Resistant     CEFEPIME <=1 Sensitive     CEFTRIAXONE 8 Resistant     CIPROFLOXACIN <=0.25 Sensitive     LEVOFLOXACIN 1 Intermediate     ERTAPENEM <=0.5 Sensitive     GENTAMICIN >=16 Resistant     IMIPENEM <=0.25 Sensitive     PIP/TAZO <=4 Sensitive     TOBRAMYCIN 8 Intermediate     TRIMETH/SULFA* >=320 Resistant      * For infections other than uncomplicated UTIcaused by E. coli, K. pneumoniae or P. mirabilis:Cefazolin is resistant if MIC > or = 8 mcg/mL.(Distinguishing susceptible versus intermediatefor isolates with MIC < or = 4 mcg/mL requiresadditional testing.)Legend:S = Susceptible  I = IntermediateR = Resistant  NS = Not susceptible* = Not tested  NR = Not reported**NN = See antimicrobic comments   Morganella morganii - AEROBIC CULT, GRAM STAIN  NEGATIVE 1    AMOX/CLAVULANIC >=32 Resistant     AMPICILLIN >=32 Resistant     AMPICILLIN/SULBACTAM >=32 Resistant     CEFAZOLIN* >=64 Resistant      * For infections other than uncomplicated UTIcaused by E. coli, K. pneumoniae or P. mirabilis:Cefazolin is resistant if MIC > or = 8 mcg/mL.(Distinguishing susceptible versus intermediatefor isolates with MIC < or = 4 mcg/mL requiresadditional testing.)    CEFEPIME <=1 Sensitive     CEFTRIAXONE <=1 Sensitive     CIPROFLOXACIN >=4 Resistant     LEVOFLOXACIN >=8 Resistant     ERTAPENEM <=0.5  Sensitive     GENTAMICIN <=1 Sensitive     PIP/TAZO <=4 Sensitive     TOBRAMYCIN <=1 Sensitive     TRIMETH/SULFA >=320 Resistant   CBC and differential     Status: Abnormal   Collection Time: 11/25/19 12:00 AM  Result Value Ref Range   Hemoglobin 10.3 (A) 12.0 - 16.0   HCT 31 (A) 36 - 46   Neutrophils Absolute 4    Platelets 254 150 - 399   WBC 6.9   CBC     Status: Abnormal   Collection Time: 11/25/19 12:00 AM  Result Value Ref Range   RBC 3.49 (A) 3.87 - 2.99  Basic metabolic panel     Status: Abnormal   Collection Time: 11/25/19 12:00 AM  Result Value Ref Range   Glucose 86    BUN 27 (A) 4 - 21   CO2 26 (A) 13 - 22   Creatinine 1.6 (A) 0.5 - 1.1   Potassium 4.4 3.4 - 5.3   Sodium 142 137 - 147   Chloride 107 99 - 108  Comprehensive metabolic panel     Status: Abnormal   Collection Time: 11/25/19 12:00 AM  Result Value Ref Range   Globulin 2.8    GFR calc Af Amer 34.88    GFR calc non Af Amer 30.09    Calcium 8.6 (A) 8.7 - 10.7   Albumin 2.9 (A) 3.5 - 5.0  Hepatic function panel     Status: Abnormal   Collection Time: 11/25/19 12:00 AM  Result Value Ref Range   Alkaline Phosphatase 140 (A) 25 - 125   ALT 15 7 - 35   AST 14 13 - 35   Bilirubin, Total 0.2   Hepatic function panel     Status: Abnormal   Collection Time: 12/09/19 12:00 AM  Result Value Ref Range   Alkaline Phosphatase 149 (A) 25 - 125  Glucose, capillary      Status: None   Collection Time: 01/04/20  9:50 AM  Result Value Ref Range   Glucose-Capillary 94 70 - 99 mg/dL    Comment: Glucose reference range applies only to samples taken after fasting for at least 8 hours.   Comment 1 Notify RN   SARS Coronavirus 2 by RT PCR (hospital order, performed in [facility] hospital lab) Nasopharyngeal Nasopharyngeal Swab     Status: None   Collection Time: 01/04/20  9:53 AM   Specimen: Nasopharyngeal Swab  Result Value Ref Range   SARS Coronavirus 2 NEGATIVE NEGATIVE    Comment: (NOTE) SARS-CoV-2 target nucleic acids are NOT DETECTED.  The SARS-CoV-2 RNA is generally detectable in upper and lower respiratory specimens during the acute phase of infection. The lowest concentration of SARS-CoV-2 viral copies this assay can detect is 250 copies / mL. A negative result does not preclude SARS-CoV-2 infection and should not be used as the sole basis for treatment or other patient management decisions.  A negative result may occur with improper specimen collection / handling, submission of specimen other than nasopharyngeal swab, presence of viral mutation(s) within the areas targeted by this assay, and inadequate number of viral copies (<250 copies / mL). A negative result must be combined with clinical observations, patient history, and epidemiological information.  Fact Sheet for Patients:   StrictlyIdeas.no  Fact Sheet for Healthcare Providers: BankingDealers.co.za  This test is not yet approved or  cleared by the [place] FDA and has been authorized for detection and/or diagnosis of SARS-CoV-2 by  FDA under an Emergency Use Authorization (EUA).  This EUA will remain in effect (meaning this test can be used) for the duration of the COVID-19 declaration under Section 564(b)(1) of the Act, 21 U.S.C. section 360bbb-3(b)(1), unless the authorization is terminated or revoked sooner.  Performed at [facility], [place] [address] [place], [place] [address]   Potassium     Status: None   Collection Time: 01/04/20 11:29 AM  Result Value Ref Range   Potassium 4.5 3.5 - 5.1 mmol/L    Comment: Performed at [facility] [address], [place], [place] [address]  I-STAT, [place] [address]     Status: Abnormal   Collection Time: 01/04/20 12:22 PM  Result Value Ref Range   Sodium 139 135 - 145 mmol/L   Potassium 4.5 3.5 - 5.1 mmol/L   Chloride 107 98 - 111 mmol/L   BUN 56 (H) 8 - 23 mg/dL   Creatinine, Ser 2.80 (H) 0.44 - 1.00 mg/dL   Glucose, Bld 109 (H) 70 - 99 mg/dL    Comment: Glucose reference range applies only to samples taken after fasting for at least 8 hours.   Calcium, Ion 1.08 (L) 1.15 - 1.40 mmol/L   TCO2 24 22 - 32 mmol/L   Hemoglobin 12.6 12.0 - 15.0 g/dL   HCT 37.0 36.0 - 46.0 %  POCT Activated clotting time     Status: None   Collection Time: 01/04/20  4:12 PM  Result Value Ref Range   Activated Clotting Time 274 seconds  Glucose, capillary     Status: Abnormal   Collection Time: 01/04/20  5:07 PM  Result Value Ref Range   Glucose-Capillary 106 (H) 70 - 99 mg/dL    Comment: Glucose reference range applies only to samples taken after fasting for at least 8 hours.  Hemoglobin A1c     Status: Abnormal   Collection Time: 01/04/20  7:43 PM  Result Value Ref Range   Hgb A1c MFr Bld 6.3 (H) 4.8 - 5.6 %    Comment: (NOTE) Pre diabetes:          5.7%-6.4%  Diabetes:              >6.4%  Glycemic control for   <7.0% adults with diabetes    Mean Plasma Glucose 134.11 mg/dL    Comment: Performed at [facility] [address], [place], [place] [address]  Glucose, capillary     Status: Abnormal   Collection Time: 01/04/20 11:36 PM  Result Value Ref Range   Glucose-Capillary 197 (H) 70 - 99 mg/dL    Comment: Glucose reference range applies only to samples taken after fasting for at least 8 hours.  Glucose, capillary     Status: Abnormal   Collection Time: 01/05/20   5:48 AM  Result Value Ref Range   Glucose-Capillary 103 (H) 70 - 99 mg/dL    Comment: Glucose reference range applies only to samples taken after fasting for at least 8 hours.  Glucose, capillary     Status: Abnormal   Collection Time: 01/05/20 11:01 AM  Result Value Ref Range   Glucose-Capillary 158 (H) 70 - 99 mg/dL    Comment: Glucose reference range applies only to samples taken after fasting for at least 8 hours.  Glucose, capillary     Status: Abnormal   Collection Time: 01/05/20  4:50 PM  Result Value Ref Range   Glucose-Capillary 117 (H) 70 - 99 mg/dL  Comment: Glucose reference range applies only to samples taken after fasting for at least 8 hours.  CBC and differential     Status: None   Collection Time: 01/13/20 12:00 AM  Result Value Ref Range   Hemoglobin 13.6 12.0 - 16.0   HCT 42 36 - 46   Neutrophils Absolute 8.40    Platelets 246 150 - 399   WBC 12.0   CBC     Status: None   Collection Time: 01/13/20 12:00 AM  Result Value Ref Range   RBC 4.59 3.87 - 7.91  Basic metabolic panel     Status: Abnormal   Collection Time: 01/13/20 12:00 AM  Result Value Ref Range   Glucose 129    BUN 80 (A) 4 - 21   CO2 23 (A) 13 - 22   Creatinine 2.4 (A) 0.5 - 1.1   Potassium 4.6 3.4 - 5.3   Sodium 134 (A) 137 - 147   Chloride 95 (A) 99 - 108  Comprehensive metabolic panel     Status: None   Collection Time: 01/13/20 12:00 AM  Result Value Ref Range   GFR calc Af Amer 21.99    GFR calc non Af Amer 18.97    Calcium 9.1 8.7 - 10.7  CBC and differential     Status: Abnormal   Collection Time: 01/17/20 12:00 AM  Result Value Ref Range   Hemoglobin 11.7 (A) 12.0 - 16.0   HCT 36 36 - 46   Neutrophils Absolute 5.10    Platelets 212 150 - 399   WBC 8.7   CBC     Status: None   Collection Time: 01/17/20 12:00 AM  Result Value Ref Range   RBC 3.95 3.87 - 5.05  Basic metabolic panel     Status: Abnormal   Collection Time: 01/17/20 12:00 AM  Result Value Ref Range   Glucose  102    BUN 80 (A) 4 - 21   CO2 23 (A) 13 - 22   Creatinine 2.0 (A) 0.5 - 1.1   Potassium 4.3 3.4 - 5.3   Sodium 137 137 - 147   Chloride 103 99 - 108  Comprehensive metabolic panel     Status: None   Collection Time: 01/17/20 12:00 AM  Result Value Ref Range   GFR calc Af Amer 27.91    GFR calc non Af Amer 24.08    Calcium 8.7 8.7 - 69.7  Basic metabolic panel     Status: Abnormal   Collection Time: 01/25/20 12:00 AM  Result Value Ref Range   Glucose 81    BUN 64 (A) 4 - 21   CO2 27 (A) 13 - 22   Creatinine 1.8 (A) 0.5 - 1.1   Potassium 4.7 3.4 - 5.3   Sodium 138 137 - 147   Chloride 101 99 - 108  Comprehensive metabolic panel     Status: None   Collection Time: 01/25/20 12:00 AM  Result Value Ref Range   GFR calc Af Amer 31.77    GFR calc non Af Amer 27.41    Calcium 8.7 8.7 - 10.7  CBC and differential     Status: Abnormal   Collection Time: 01/26/20 12:00 AM  Result Value Ref Range   Hemoglobin 11.1 (A) 12.0 - 16.0   HCT 34 (A) 36 - 46   Neutrophils Absolute 5.60    Platelets 190 150 - 399   WBC 9.2   CBC     Status: Abnormal  Collection Time: 01/26/20 12:00 AM  Result Value Ref Range   RBC 3.74 (A) 3.87 - 5.11  CBC and differential     Status: Abnormal   Collection Time: 01/30/20 12:00 AM  Result Value Ref Range   Hemoglobin 11.5 (A) 12.0 - 16.0   HCT 35 (A) 36 - 46   Neutrophils Absolute 4.50    Platelets 197 150 - 399   WBC 8.0   CBC     Status: Abnormal   Collection Time: 01/30/20 12:00 AM  Result Value Ref Range   RBC 3.86 (A) 3.87 - 5.11  House Account Tracking only     Status: None   Collection Time: 02/02/20 12:13 PM  Result Value Ref Range   Tracking House Account      Comment: We were unable to identify an account number for the order submitted. If you do not have a [facility]  account number or if your account information needs  to be updated please call [phone] for assistance. . To prevent delays in testing and  processing of your orders please provide the following information for this order and with every additional order submitted: [facility] account number and account name  Client address Client phone and fax number NPI number of ordering physician along with the physician name.   WOUND CULTURE     Status: None   Collection Time: 02/02/20 12:13 PM  Result Value Ref Range   MICRO NUMBER: [id number]    SPECIMEN QUALITY: Adequate    SOURCE: RT GREAT TOE    STATUS: FINAL    GRAM STAIN:      No white blood cells seen Rare epithelial cells Many Gram positive cocci in clusters   RESULT:      Growth of skin flora (note: Growth does not include S. aureus, beta-hemolytic Streptococci or P. aeruginosa).  Resp Panel by RT-PCR (Flu A&B, Covid) Nasopharyngeal Swab     Status: None   Collection Time: 02/08/20  6:10 PM   Specimen: Nasopharyngeal Swab; Nasopharyngeal(NP) swabs in vial transport medium  Result Value Ref Range   SARS Coronavirus 2 by RT PCR NEGATIVE NEGATIVE    Comment: (NOTE) SARS-CoV-2 target nucleic acids are NOT DETECTED.  The SARS-CoV-2 RNA is generally detectable in upper respiratory specimens during the acute phase of infection. The lowest concentration of SARS-CoV-2 viral copies this assay can detect is 138 copies/mL. A negative result does not preclude SARS-Cov-2 infection and should not be used as the sole basis for treatment or other patient management decisions. A negative result may occur with  improper specimen collection/handling, submission of specimen other than nasopharyngeal swab, presence of viral mutation(s) within the areas targeted by this assay, and inadequate number of viral copies(<138 copies/mL). A negative result must be combined with clinical observations, patient history, and epidemiological information. The expected result is Negative.  Fact Sheet for Patients:  EntrepreneurPulse.com.au  Fact Sheet for Healthcare Providers:   IncredibleEmployment.be  This test is no t yet approved or cleared by the [place] FDA and  has been authorized for detection and/or diagnosis of SARS-CoV-2 by FDA under an Emergency Use Authorization (EUA). This EUA will remain  in effect (meaning this test can be used) for the duration of the COVID-19 declaration under Section 564(b)(1) of the Act, 21 U.S.C.section 360bbb-3(b)(1), unless the authorization is terminated  or revoked sooner.       Influenza A by PCR NEGATIVE NEGATIVE   Influenza B by PCR NEGATIVE NEGATIVE  Comment: (NOTE) The Xpert Xpress SARS-CoV-2/FLU/RSV plus assay is intended as an aid in the diagnosis of influenza from Nasopharyngeal swab specimens and should not be used as a sole basis for treatment. Nasal washings and aspirates are unacceptable for Xpert Xpress SARS-CoV-2/FLU/RSV testing.  Fact Sheet for Patients: EntrepreneurPulse.com.au  Fact Sheet for Healthcare Providers: IncredibleEmployment.be  This test is not yet approved or cleared by the [place] FDA and has been authorized for detection and/or diagnosis of SARS-CoV-2 by FDA under an Emergency Use Authorization (EUA). This EUA will remain in effect (meaning this test can be used) for the duration of the COVID-19 declaration under Section 564(b)(1) of the Act, 21 U.S.C. section 360bbb-3(b)(1), unless the authorization is terminated or revoked.  Performed at [facility], [place] [address], [place], [place] [address]   Comprehensive metabolic panel     Status: Abnormal   Collection Time: 02/08/20  6:27 PM  Result Value Ref Range   Sodium 139 135 - 145 mmol/L   Potassium 4.5 3.5 - 5.1 mmol/L   Chloride 104 98 - 111 mmol/L   CO2 23 22 - 32 mmol/L   Glucose, Bld 81 70 - 99 mg/dL    Comment: Glucose reference range applies only to samples taken after fasting for at least 8 hours.   BUN 115 (H) 8 - 23 mg/dL   Creatinine, Ser  2.17 (H) 0.44 - 1.00 mg/dL   Calcium 9.3 8.9 - 10.3 mg/dL   Total Protein 7.0 6.5 - 8.1 g/dL   Albumin 2.4 (L) 3.5 - 5.0 g/dL   AST 34 15 - 41 U/L   ALT 43 0 - 44 U/L   Alkaline Phosphatase 161 (H) 38 - 126 U/L   Total Bilirubin 0.6 0.3 - 1.2 mg/dL   GFR, Estimated 23 (L) >60 mL/min    Comment: (NOTE) Calculated using the CKD-EPI Creatinine Equation (2021)    Anion gap 12 5 - 15    Comment: Performed at [facility] [address] [place], [place] [address]  CBC with Differential     Status: Abnormal   Collection Time: 02/08/20  6:27 PM  Result Value Ref Range   WBC 9.4 4.0 - 10.5 K/uL   RBC 4.39 3.87 - 5.11 MIL/uL   Hemoglobin 13.2 12.0 - 15.0 g/dL   HCT 40.8 36.0 - 46.0 %   MCV 92.9 80.0 - 100.0 fL   MCH 30.1 26.0 - 34.0 pg   MCHC 32.4 30.0 - 36.0 g/dL   RDW 15.4 11.5 - 15.5 %   Platelets 340 150 - 400 K/uL   nRBC 0.2 0.0 - 0.2 %   Neutrophils Relative % 59 %   Neutro Abs 5.6 1.7 - 7.7 K/uL   Lymphocytes Relative 25 %   Lymphs Abs 2.3 0.7 - 4.0 K/uL   Monocytes Relative 12 %   Monocytes Absolute 1.1 (H) 0.1 - 1.0 K/uL   Eosinophils Relative 3 %   Eosinophils Absolute 0.3 0.0 - 0.5 K/uL   Basophils Relative 0 %   Basophils Absolute 0.0 0.0 - 0.1 K/uL   Immature Granulocytes 1 %   Abs Immature Granulocytes 0.06 0.00 - 0.07 K/uL    Comment: Performed at [facility] [address] [place],  [address]  TSH     Status: None   Collection Time: 02/08/20  6:27 PM  Result Value Ref Range   TSH 4.030 0.350 - 4.500 uIU/mL    Comment: Performed by a 3rd  Generation assay with a functional sensitivity of <=0.01 uIU/mL. Performed at [facility], [place] [address] [place], [place] [address]   Basic metabolic panel     Status: Abnormal   Collection Time: 02/13/20 12:00 AM  Result Value Ref Range   Glucose 105    BUN 106 (A) 4 - 21   CO2 22 13 - 22   Creatinine 1.9 (A) 0.5 - 1.1   Potassium 4.4 3.4 - 5.3   Sodium 134 (A) 137 - 147   Chloride 97 (A)  99 - 108  Comprehensive metabolic panel     Status: Abnormal   Collection Time: 02/13/20 12:00 AM  Result Value Ref Range   Globulin 3.5    GFR calc Af Amer 28.78    GFR calc non Af Amer 24.83    Calcium 9.0 8.7 - 10.7   Albumin 2.9 (A) 3.5 - 5.0  Hepatic function panel     Status: Abnormal   Collection Time: 02/13/20 12:00 AM  Result Value Ref Range   Alkaline Phosphatase 222 (A) 25 - 125   ALT 49 (A) 7 - 35   AST 51 (A) 13 - 35   Bilirubin, Total 0.3   Basic metabolic panel     Status: Abnormal   Collection Time: 02/15/20 12:00 AM  Result Value Ref Range   Glucose 104    BUN 101 (A) 4 - 21   CO2 22 13 - 22   Creatinine 1.6 (A) 0.5 - 1.1   Potassium 4.1 3.4 - 5.3   Sodium 136 (A) 137 - 147   Chloride 101 99 - 108  Comprehensive metabolic panel     Status: None   Collection Time: 02/15/20 12:00 AM  Result Value Ref Range   GFR calc Af Amer 35.08    GFR calc non Af Amer 30.27    Calcium 8.7 8.7 - 10.7    Objective: There were no vitals filed for this visit.  General: Patient is awake, alert, oriented x 3 and in no acute distress.  Dermatology: Skin is warm and dry bilateral with a full thickness ulceration present distal tuft of the right great toe  Ulceration measures 1cm x 0.5 cm x 0.3 cm. There is a dry eschar/keratotic border, no malodor, no erythema, localized edema. No other acute signs of infection.  To the right heel there is a dry eschar noted that measures 2 x 2 cm with eschar base no active drainage no bogginess there is mild edema but no malodor unchanged from prior.  To the left there is a stage 3 ulceration to heel that measures 2.5x2cm with 90:10 fibrogranular base and fatty tissue does not probe to bone and no redness, no warmth, no active drainage.   Vascular: Dorsalis Pedis pulse = 0/4 Bilateral,  Posterior Tibial pulse = 0/4 Bilateral,  Capillary Fill Time < 5 seconds  Neurologic: Protective sensation severely diminished with history of  neuropathy.  Musculosketal: There is minimal pain with palpation to ulcerated areas on the right or left.  Severe end-stage bunion deformity.  No pain with compression to calves bilateral.   Recent Labs    07/17/19 2150  LABORGA GROUP B STREP(S.AGALACTIAE)ISOLATED    Assessment and Plan:  Problem List Items Addressed This Visit      Endocrine   DM (diabetes mellitus) ([place])   Relevant Orders   AMB referral to wound care center    Other Visit Diagnoses    Decubitus ulcer of heel, right,  unstageable ([place])    -  Primary   Relevant Orders   AMB referral to wound care center   Skin ulcer of right great toe, limited to breakdown of skin ([place])       Relevant Orders   AMB referral to wound care center   Osteomyelitis of great toe of right foot ([place])       Relevant Orders   AMB referral to wound care center   Decubitus ulcer of left heel, stage 3 ([place])       Relevant Orders   AMB referral to wound care center   PAD (peripheral artery disease) ([place])       Relevant Orders   AMB referral to wound care center     -Examined patient and discussed the progression of the wounds and treatment alternatives. -Cleansed ulceration using a saline moistened gauze debrided any loose tissue to the area at the right great toe and heels. Hemostasis was achieved with manuel pressure. Patient tolerated nonselective debridement procedure well without any discomfort or anesthesia necessary for this wound debridement.  -Applied Betadine and dry sterile dressing and wrote orders for the facility to continue with the same -Referral made to [facility] wound care center for other wound treatment alternatives  -Continue with Doxycyline  -Continue with pillow offloading of heel to prevent worsening of heel wounds when in bed -Continue with vascular follow-up  - Advised patient to go to the ER or return to office if the wound worsens or if constitutional symptoms are present. -Return after wound care center or sooner  problems or issues arise  Landis Martins, DPM

## 2020-02-20 DIAGNOSIS — L89152 Pressure ulcer of sacral region, stage 2: Secondary | ICD-10-CM | POA: Diagnosis not present

## 2020-02-22 ENCOUNTER — Encounter: Payer: Self-pay | Admitting: Internal Medicine

## 2020-02-22 DIAGNOSIS — M869 Osteomyelitis, unspecified: Secondary | ICD-10-CM | POA: Insufficient documentation

## 2020-02-27 DIAGNOSIS — I69354 Hemiplegia and hemiparesis following cerebral infarction affecting left non-dominant side: Secondary | ICD-10-CM | POA: Diagnosis not present

## 2020-02-27 DIAGNOSIS — R279 Unspecified lack of coordination: Secondary | ICD-10-CM | POA: Diagnosis not present

## 2020-02-27 DIAGNOSIS — M6281 Muscle weakness (generalized): Secondary | ICD-10-CM | POA: Diagnosis not present

## 2020-02-28 ENCOUNTER — Other Ambulatory Visit: Payer: Self-pay

## 2020-02-28 ENCOUNTER — Encounter (HOSPITAL_COMMUNITY): Payer: Self-pay

## 2020-02-28 ENCOUNTER — Ambulatory Visit (INDEPENDENT_AMBULATORY_CARE_PROVIDER_SITE_OTHER): Payer: Medicare Other | Admitting: Vascular Surgery

## 2020-02-28 ENCOUNTER — Ambulatory Visit (HOSPITAL_COMMUNITY)
Admission: RE | Admit: 2020-02-28 | Discharge: 2020-02-28 | Disposition: A | Discharge status: Home / Self Care | Payer: Medicare Other | Source: Ambulatory Visit | Attending: Vascular Surgery | Admitting: Vascular Surgery

## 2020-02-28 ENCOUNTER — Encounter: Payer: Self-pay | Admitting: Vascular Surgery

## 2020-02-28 ENCOUNTER — Ambulatory Visit (INDEPENDENT_AMBULATORY_CARE_PROVIDER_SITE_OTHER)
Admission: RE | Admit: 2020-02-28 | Discharge: 2020-02-28 | Disposition: A | Discharge status: Home / Self Care | Payer: Medicare Other | Source: Ambulatory Visit | Attending: Vascular Surgery | Admitting: Vascular Surgery

## 2020-02-28 VITALS — BP 95/64 | HR 68 | Temp 97.9°F | Resp 20 | Ht 65.0 in | Wt 146.0 lb

## 2020-02-28 DIAGNOSIS — M6281 Muscle weakness (generalized): Secondary | ICD-10-CM | POA: Diagnosis not present

## 2020-02-28 DIAGNOSIS — I69354 Hemiplegia and hemiparesis following cerebral infarction affecting left non-dominant side: Secondary | ICD-10-CM | POA: Diagnosis not present

## 2020-02-28 DIAGNOSIS — I739 Peripheral vascular disease, unspecified: Secondary | ICD-10-CM

## 2020-02-28 DIAGNOSIS — I7025 Atherosclerosis of native arteries of other extremities with ulceration: Secondary | ICD-10-CM | POA: Diagnosis not present

## 2020-02-28 DIAGNOSIS — R279 Unspecified lack of coordination: Secondary | ICD-10-CM | POA: Diagnosis not present
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
## 2020-02-29 ENCOUNTER — Other Ambulatory Visit: Payer: Self-pay

## 2020-02-29 ENCOUNTER — Encounter: Payer: Self-pay | Admitting: Adult Health

## 2020-02-29 ENCOUNTER — Non-Acute Institutional Stay (SKILLED_NURSING_FACILITY): Payer: Medicare Other | Admitting: Adult Health

## 2020-02-29 DIAGNOSIS — R279 Unspecified lack of coordination: Secondary | ICD-10-CM | POA: Diagnosis not present

## 2020-02-29 DIAGNOSIS — F339 Major depressive disorder, recurrent, unspecified: Secondary | ICD-10-CM | POA: Diagnosis not present

## 2020-02-29 DIAGNOSIS — Z794 Long term (current) use of insulin: Secondary | ICD-10-CM

## 2020-02-29 DIAGNOSIS — N182 Chronic kidney disease, stage 2 (mild): Secondary | ICD-10-CM | POA: Diagnosis not present

## 2020-02-29 DIAGNOSIS — I69354 Hemiplegia and hemiparesis following cerebral infarction affecting left non-dominant side: Secondary | ICD-10-CM | POA: Diagnosis not present

## 2020-02-29 DIAGNOSIS — N1832 Chronic kidney disease, stage 3b: Secondary | ICD-10-CM

## 2020-02-29 DIAGNOSIS — I7025 Atherosclerosis of native arteries of other extremities with ulceration: Secondary | ICD-10-CM

## 2020-02-29 DIAGNOSIS — E1140 Type 2 diabetes mellitus with diabetic neuropathy, unspecified: Secondary | ICD-10-CM

## 2020-02-29 DIAGNOSIS — M6281 Muscle weakness (generalized): Secondary | ICD-10-CM | POA: Diagnosis not present

## 2020-02-29 DIAGNOSIS — I129 Hypertensive chronic kidney disease with stage 1 through stage 4 chronic kidney disease, or unspecified chronic kidney disease: Secondary | ICD-10-CM | POA: Diagnosis not present

## 2020-02-29 NOTE — Progress Notes (Signed)
Location:  [facility] Room Number: 217 A Place of Service:  SNF (31) Provider:  Durenda Age, DNP, FNP-BC  Patient Care Team: Hendricks Limes, MD as PCP - General (Internal Medicine) Dorothy Spark, MD as PCP - Cardiology (Cardiology) Constance Haw, MD as PCP - Electrophysiology (Cardiology) Medina-Vargas, Senaida Lange, NP as Nurse Practitioner (Internal Medicine) Rehab, [facility] ([place])  Extended Emergency Contact Information Primary Emergency Contact: Lieutenant Diego [place] of [place] Phone: [phone] Mobile Phone: [phone] Relation: Daughter Secondary Emergency Contact: Armstrong,[name]  [place] Phone: [phone] Relation: Daughter  Code Status:  Full Code  Goals of care: Advanced Directive information Advanced Directives 02/08/2020  Does Patient Have a Medical Advance Directive? No  Type of Advance Directive -  Does patient want to make changes to medical advance directive? -  Copy of [place] in Chart? -  Would patient like information on creating a medical advance directive? No - Patient declined  Pre-existing out of facility DNR order (yellow form or pink MOST form) -     Chief Complaint  Patient presents with  . Acute Visit    Poor oral intake    HPI:  Pt is a 75 y.o. female seen today for poor oral intake. She has a PMH of chronic kidney disease, CAD, severe aortic stenosis, history of breast cancer, essential hypertension, dyslipidemia, stroke and chronic diastolic congestive heart failure. She refused breakfast and lunch according to staff. Stat labs today showed BUN 123.5 up from 100.9 (02/15/20), creatinine 1.58 improved from 1.63 (02/15/20, K 4.7 and GFR 36.42 (GFR 35.08, 02/15/20). She is currently has orders for med pass 120 ml BID, Ensure Enlive BID for supplementation. CBGs ranging from 71 to 253.  She is currently taking  Xultophy 14 units daily for diabetes mellitus.  He verbalized that her right foot pain is better. She is currently taking  Neurontin 100 mg at bedtime for neuropathy.  Past Medical History:  Diagnosis Date  . Acute on chronic diastolic CHF (congestive heart failure) ([place]) 06/22/2017  . Acute on chronic renal failure ([place]) 05/25/2011  . Adenocarcinoma of breast ([place]) 1996   Completed tamoxifen and had mastectomy.  . Aortic stenosis, severe    s/p aortic valve replacement with porcine valve 06/2004.  ECHO 2010 EF 07%, LVH, diastolic dysfxn, Bioprostetic aoritc valve, mild AS. ECHO 2013 EF 60%, Nl aortic artificial valve, dynamic obstruction in the outflow tract   Class IIb rec for annual TTE after 5 yrs. She had a TTE 2013    . Arthritis   . CAD (coronary artery disease) 2006   s/p CABG (5/06) w/ saphenous vein to RCA at time of AVR  . CKD (chronic kidney disease) stage 2, GFR 60-89 ml/min 06/03/2011   She is no longer taking NSAIDs.   Marland Kitchen CVA (cerebral infarction) 2006   Post-op from AVR. Presumed embolic in nature. Carotid stenosis of R 60-79%. Repeat dopplers 4/10 no R stenosis and L stenosis of 1-29%.  . Depression    Controlled on Paxil  . Diabetes mellitus 1992   Dx 04/25/1990. Now insulin dependent, started 2008. On ACEI.   . Diverticulosis 2001  . History of 2019 novel coronavirus disease (COVID-19)   . Hyperlipidemia    Mgmt with a statin  . Hypertension    Requires 4 drug tx  . Osteoporosis 2006   DEXA 10/06 : L femur T -2.8, R -2.7. Lumbar T -2.4. On  bisphosphonates and  Calcium / Vit D.  . Peripheral vascular disease ([place])   . Presence of permanent cardiac pacemaker   . Refusal of blood transfusions as patient is Jehovah's Witness   . Stroke [facility])    Past Surgical History:  Procedure Laterality Date  . ABDOMINAL HYSTERECTOMY  1987   for fibroids  . AORTIC VALVE REPLACEMENT  2006  . CHOLECYSTECTOMY    . CORONARY ARTERY BYPASS GRAFT  2006   Saphenous vein to RCA at time of  AVR. Course complicated by acute respiratory failure, post-op PTX, ARI, ileus, CVA  . LOWER EXTREMITY ANGIOGRAPHY N/A 01/04/2020   Procedure: LOWER EXTREMITY ANGIOGRAPHY - Right;  Surgeon: Cherre Robins, MD;  Location: [facility] CV LAB;  Service: Cardiovascular;  Laterality: N/A;  . MASTECTOMY Left 1995   L for adenocarcinoma  . PACEMAKER IMPLANT N/A 02/09/2019   Procedure: PACEMAKER IMPLANT;  Surgeon: Constance Haw, MD;  Location: [facility] CV LAB;  Service: Cardiovascular;  Laterality: N/A;  . PERIPHERAL VASCULAR BALLOON ANGIOPLASTY  01/04/2020   Procedure: PERIPHERAL VASCULAR BALLOON ANGIOPLASTY;  Surgeon: Cherre Robins, MD;  Location: [facility] CV LAB;  Service: Cardiovascular;;  Rt AT  . RIGHT HEART CATH AND CORONARY/GRAFT ANGIOGRAPHY N/A 02/09/2019   Procedure: RIGHT HEART CATH AND CORONARY/GRAFT ANGIOGRAPHY;  Surgeon: Sherren Mocha, MD;  Location: [facility] CV LAB;  Service: Cardiovascular;  Laterality: N/A;  . TEE WITHOUT CARDIOVERSION N/A 07/20/2019   Procedure: TRANSESOPHAGEAL ECHOCARDIOGRAM (TEE);  Surgeon: Jerline Pain, MD;  Location: [facility];  Service: Cardiovascular;  Laterality: N/A;  . TRANSCATHETER AORTIC VALVE REPLACEMENT, TRANSFEMORAL N/A 03/01/2019   Procedure: TRANSCATHETER AORTIC VALVE REPLACEMENT, TRANSFEMORAL;  Surgeon: Sherren Mocha, MD;  Location: [facility] CV LAB;  Service: Open Heart Surgery;  Laterality: N/A;    Allergies  Allergen Reactions  . 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," as the patient is a Jehovah's Witness    Outpatient Encounter Medications as of 02/29/2020  Medication Sig  . acetaminophen (TYLENOL) 325 MG tablet Take 650 mg by mouth every 6 (six) hours as needed. Notify MD id not relieved (Not to exceed 3000 mg ) in 24 hour per stranding order  . acetaminophen (TYLENOL) 325 MG tablet Take 650 mg by mouth in the morning.  . Amino Acids-Protein Hydrolys (FEEDING SUPPLEMENT, PRO-STAT SUGAR FREE 64,) LIQD  Take 30 mLs by mouth in the morning and at bedtime.  Marland Kitchen amLODipine (NORVASC) 5 MG tablet Take 1 tablet (5 mg total) by mouth daily.  Marland Kitchen amoxicillin (AMOXIL) 500 MG capsule Take 2,000 mg by mouth as needed. Prior to dental procedure  . aspirin 81 MG EC tablet TAKE 1 TABLET (81 MG TOTAL) BY MOUTH DAILY. SWALLOW WHOLE.  Marland Kitchen atorvastatin (LIPITOR) 80 MG tablet Take 1 tablet (80 mg total) by mouth at bedtime.  . clopidogrel (PLAVIX) 75 MG tablet Take 1 tablet (75 mg total) by mouth daily with breakfast.  . diclofenac Sodium (VOLTAREN) 1 % GEL Apply 4 g topically 4 (four) times daily as needed (Pain). Apply to bilateral hips and knees  . doxycycline (VIBRA-TABS) 100 MG tablet Take 100 mg by mouth 2 (two) times daily.  . DULoxetine (CYMBALTA) 30 MG capsule Take 30 mg by mouth daily.  . Ensure (ENSURE) Take 237 mLs by mouth in the morning and at bedtime. Strawberry if available  . furosemide (LASIX) 80 MG tablet Take 80 mg by mouth 2 (two) times daily.  Marland Kitchen gabapentin (NEURONTIN) 100 MG capsule  Take 100 mg by mouth at bedtime.  . hydrALAZINE (APRESOLINE) 50 MG tablet Take 1 tablet (50 mg total) by mouth 3 (three) times daily.  . Insulin Degludec-Liraglutide (XULTOPHY) 100-3.6 UNIT-MG/ML SOPN Inject 14 Units into the skin daily.  Marland Kitchen lactulose (CHRONULAC) 10 GM/15ML solution Take 30 g by mouth 2 (two) times daily.  . metoprolol succinate (TOPROL-XL) 25 MG 24 hr tablet Take 1 tablet (25 mg total) by mouth daily.  . Multiple Vitamins-Minerals (DECUBI-VITE) CAPS Take 1 capsule by mouth daily.  . potassium chloride SA (KLOR-CON) 20 MEQ tablet Take 20 mEq by mouth daily.   Marland Kitchen saccharomyces boulardii (FLORASTOR) 250 MG capsule Take 250 mg by mouth 2 (two) times daily.   No facility-administered encounter medications on file as of 02/29/2020.    Review of Systems  GENERAL: poor appetite no fever or chills  MOUTH and THROAT: Denies oral discomfort, gingival pain or bleeding RESPIRATORY: no cough, SOB, DOE, wheezing,  hemoptysis CARDIAC: No chest pain, edema or palpitations GI: No abdominal pain, diarrhea, constipation, heart burn, nausea or vomiting GU: Denies dysuria, frequency, hematuria, incontinence, or discharge NEUROLOGICAL: Denies dizziness, syncope, numbness, or headache PSYCHIATRIC: Denies feelings of depression or anxiety. No report of hallucinations, insomnia, paranoia, or agitation   Immunization History  Administered Date(s) Administered  . Fluad Quad(high Dose 65+) 02/11/2019  . Influenza Split 11/12/2010, 11/10/2011  . Influenza Whole 11/07/2009  . Influenza,inj,Quad PF,6+ Mos 10/19/2012, 01/05/2014, 11/08/2015, 01/29/2017  . Influenza-Unspecified 12/15/2019  . Moderna Sars-Covid-2 Vaccination 03/09/2019, 04/06/2019  . Pneumococcal Conjugate-13 06/07/2014  . Pneumococcal Polysaccharide-23 12/26/1994, 03/05/2010  . Td 09/24/1993  . Tdap 11/12/2010, 07/12/2013   Pertinent  Health Maintenance Due  Topic Date Due  . OPHTHALMOLOGY EXAM  11/07/2016  . FOOT EXAM  07/10/2017  . LIPID PANEL  02/07/2020  . HEMOGLOBIN A1C  04/05/2020  . INFLUENZA VACCINE  Completed  . DEXA SCAN  Completed  . PNA vac Low Risk Adult  Completed   Fall Risk  01/10/2020 08/02/2019 06/16/2019 05/19/2019 05/04/2019  Falls in the past year? $RemoveBe'1 1 1 1 'bxHBANdlF$ 0  Comment - - pt denies-but dtr stated pt had a fall in last mths pt denies falls-but dtr states pt has had a fall -  Number falls in past yr: 1 1 0 0 -  Injury with Fall? 0 0 0 0 -  Risk Factor Category  - - - - -  Risk for fall due to : History of fall(s);Impaired balance/gait Impaired mobility History of fall(s);Impaired balance/gait;Impaired mobility;Medication side effect History of fall(s);Impaired balance/gait;Impaired mobility;Medication side effect No Fall Risks  Risk for fall due to: Comment - - - - -  Follow up Falls evaluation completed;Education provided;Falls prevention discussed Falls evaluation completed Falls evaluation completed;Falls prevention  discussed Falls evaluation completed Falls evaluation completed     Vitals:   02/29/20 1613  BP: 113/64  Pulse: 71  Resp: 17  Temp: (!) 97 F (36.1 C)  Weight: 137 lb (62.1 kg)  Height: $Remove'5\' 5"'YhRBIJt$  (1.651 m)   Body mass index is 22.8 kg/m.  Physical Exam  GENERAL APPEARANCE:  In no acute distress. Normal body habitus SKIN:  Right great to ulcer and right heel with black eschar  MOUTH and THROAT: Lips are without lesions. Oral mucosa is moist and without lesions.  RESPIRATORY: Breathing is even & unlabored, BS CTAB CARDIAC: RRR, no murmur,no extra heart sounds, no edema GI: Abdomen soft, normal BS, no masses, no tenderness NEUROLOGICAL: There is no tremor. Speech is clear.  PSYCHIATRIC:  Affect and behavior are appropriate  Labs reviewed: Recent Labs    03/02/19 0253 05/04/19 1144 07/20/19 0301 07/21/19 0356 08/23/19 1701 09/20/19 0000 01/04/20 1222 01/13/20 0000 02/08/20 1827 02/13/20 0000 02/15/20 0000  NA 140   < > 137   < > 143   < > 139   < > 139 134* 136*  K 3.7   < > 3.6   < > 4.4   < > 4.5   < > 4.5 4.4 4.1  CL 105   < > 98   < > 102   < > 107   < > 104 97* 101  CO2 26   < > 27   < > 25   < >  --    < > $R'23 22 22  'iT$ GLUCOSE 115*   < > 239*   < > 121*  --  109*  --  81  --   --   BUN 24*   < > 26*   < > 44*   < > 56*   < > 115* 106* 101*  CREATININE 1.47*   < > 1.56*   < > 1.62*   < > 2.80*   < > 2.17* 1.9* 1.6*  CALCIUM 8.8*   < > 8.6*   < > 9.4   < >  --    < > 9.3 9.0 8.7  MG 1.7  --  1.8  --   --   --   --   --   --   --   --    < > = values in this interval not displayed.   Recent Labs    07/17/19 2150 11/25/19 0000 12/09/19 0000 02/08/20 1827 02/13/20 0000  AST 17 14  --  34 51*  ALT 13 15  --  43 49*  ALKPHOS 141* 140* 149* 161* 222*  BILITOT 0.8  --   --  0.6  --   PROT 7.6  --   --  7.0  --   ALBUMIN 3.4* 2.9*  --  2.4* 2.9*   Recent Labs    07/19/19 0219 07/20/19 0723 08/02/19 0000 01/26/20 0000 01/30/20 0000 02/08/20 1827  WBC 16.1*  9.2   < > 9.2 8.0 9.4  NEUTROABS  --   --    < > 5.60 4.50 5.6  HGB 10.3* 10.3*   < > 11.1* 11.5* 13.2  HCT 32.2* 32.4*   < > 34* 35* 40.8  MCV 93.9 93.9  --   --   --  92.9  PLT 184 193   < > 190 197 340   < > = values in this interval not displayed.   Lab Results  Component Value Date   TSH 4.030 02/08/2020   Lab Results  Component Value Date   HGBA1C 6.3 (H) 01/04/2020   Lab Results  Component Value Date   CHOL 99 02/07/2019   HDL 26 (L) 02/07/2019   LDLCALC 50 02/07/2019   TRIG 117 02/07/2019   CHOLHDL 3.8 02/07/2019    Significant Diagnostic Results in last 30 days:  DG Chest 2 View  Result Date: 02/08/2020 CLINICAL DATA:  Weakness. EXAM: CHEST - 2 VIEW COMPARISON:  Radiograph 07/21/2019 FINDINGS: Post TAVR. Chronic cardiomegaly. Unchanged mediastinal contours. Aortic tortuosity and atherosclerosis. Post median sternotomy. Previous right-sided pacemaker has been removed. No focal airspace disease, pulmonary edema, pleural effusion, or pneumothorax. No acute osseous abnormalities are seen. Multiple overlying monitoring devices  in place. IMPRESSION: 1. No acute findings. 2. Chronic cardiomegaly, aortic tortuosity and atherosclerosis. Previous TAVR. Electronically Signed   By: Keith Rake M.D.   On: 02/08/2020 18:21   DG Foot Complete Right  Result Date: 02/08/2020 CLINICAL DATA:  Weakness EXAM: RIGHT FOOT COMPLETE - 3+ VIEW COMPARISON:  11/24/2019 FINDINGS: Osteopenia limits the exam. Vascular calcifications. Ulcer at the tip of the great toe. Progressed bony destructive change involving the distal phalanx suspect for osteomyelitis. Sclerosis and deformity of the second proximal phalanx probably due to remote fracture. Bunion at the first metatarsal head with degenerative changes at the MTP joint. IMPRESSION: 1. Progressed bony destructive change involving the distal phalanx of the great toe, suspect for osteomyelitis. Overlying ulcer at the tip of the great toe. 2. Suspect  chronic fracture deformity of the second proximal phalanx Electronically Signed   By: Donavan Foil M.D.   On: 02/08/2020 18:23   VAS [place] ABI WITH/WO TBI  Result Date: 02/28/2020 LOWER EXTREMITY DOPPLER STUDY Indications: Ulceration, and peripheral artery disease. High Risk Factors: Hypertension, hyperlipidemia, past history of smoking, prior                    CVA. CABG Other Factors: Marland Kitchen  Vascular Interventions: Angioplasty right anterior tibial artery 01/04/2020. Limitations: Today's exam was limited due to Exam performed in WC. Performing Technologist: Alvia Grove RVT  Examination Guidelines: A complete evaluation includes at minimum, Doppler waveform signals and systolic blood pressure reading at the level of bilateral brachial, anterior tibial, and posterior tibial arteries, when vessel segments are accessible. Bilateral testing is considered an integral part of a complete examination. Photoelectric Plethysmograph (PPG) waveforms and toe systolic pressure readings are included as required and additional duplex testing as needed. Limited examinations for reoccurring indications may be performed as noted.  ABI Findings: +--------+------------------+-----+----------+--------+ Right   Rt Pressure (mmHg)IndexWaveform  Comment  +--------+------------------+-----+----------+--------+ [id number]                                       +--------+------------------+-----+----------+--------+ PTA     238               2.33 biphasic           +--------+------------------+-----+----------+--------+ DP      249               2.44 monophasicbrisk    +--------+------------------+-----+----------+--------+ +--------+------------------+-----+----------+-------+ Left    Lt Pressure (mmHg)IndexWaveform  Comment +--------+------------------+-----+----------+-------+ [id number]                                       +--------+------------------+-----+----------+-------+ PTA     240                2.35 monophasic        +--------+------------------+-----+----------+-------+ DP      254               2.49 monophasicbrisk   +--------+------------------+-----+----------+-------+ +-------+-----------+------------+------------+------------+ ABI/TBIToday's ABIToday's TBI Previous ABIPrevious TBI +-------+-----------+------------+------------+------------+ Right  [place]         not obtainedNC                       +-------+-----------+------------+------------+------------+ Left   [place]         not obtainedNC                       +-------+-----------+------------+------------+------------+  Summary: Right: Resting right ankle-brachial index indicates noncompressible right lower extremity arteries. Left: Resting left ankle-brachial index indicates noncompressible left lower extremity arteries.  *See table(s) above for measurements and observations.  Electronically signed by Jamelle Haring on 02/28/2020 at 4:38:22 PM.   Final     Assessment/Plan  1. Stage 3b chronic kidney disease (HCC) -  GFR 36.42, has poor oral intake -  Will give 0.9 NS IV fluid @ 70 ml/hour X 1 L  2. Type 2 diabetes mellitus with diabetic neuropathy, with long-term current use of insulin (HCC) Lab Results  Component Value Date   HGBA1C 6.3 (H) 01/04/2020   -  Continue Xultophy and Neurontin  3. Major depression, recurrent, chronic (HCC) -  Paxil was recently tapered off and started on Cymbalta to manage right foot pain -  Referred to be seen by psych NP    Family/ staff Communication: Discussed plan of care with resident and charge nurse.  Labs/tests ordered:  BMP after IVF  Goals of care:   Long-term care   Durenda Age, DNP, MSN, FNP-BC [facility] [phone] (Monday-Friday 8:00 a.m. - 5:00 p.m.) [phone] (after hours)

## 2020-03-01 ENCOUNTER — Ambulatory Visit (INDEPENDENT_AMBULATORY_CARE_PROVIDER_SITE_OTHER): Payer: Medicare Other | Admitting: Sports Medicine

## 2020-03-01 ENCOUNTER — Encounter: Payer: Self-pay | Admitting: Sports Medicine

## 2020-03-01 ENCOUNTER — Other Ambulatory Visit: Payer: Self-pay

## 2020-03-01 DIAGNOSIS — M869 Osteomyelitis, unspecified: Secondary | ICD-10-CM

## 2020-03-01 DIAGNOSIS — M6281 Muscle weakness (generalized): Secondary | ICD-10-CM | POA: Diagnosis not present

## 2020-03-01 DIAGNOSIS — I69354 Hemiplegia and hemiparesis following cerebral infarction affecting left non-dominant side: Secondary | ICD-10-CM | POA: Diagnosis not present

## 2020-03-01 DIAGNOSIS — I739 Peripheral vascular disease, unspecified: Secondary | ICD-10-CM | POA: Diagnosis not present

## 2020-03-01 DIAGNOSIS — L89610 Pressure ulcer of right heel, unstageable: Secondary | ICD-10-CM | POA: Diagnosis not present

## 2020-03-01 DIAGNOSIS — I7025 Atherosclerosis of native arteries of other extremities with ulceration: Secondary | ICD-10-CM | POA: Diagnosis not present

## 2020-03-01 DIAGNOSIS — L89622 Pressure ulcer of left heel, stage 2: Secondary | ICD-10-CM

## 2020-03-01 DIAGNOSIS — R279 Unspecified lack of coordination: Secondary | ICD-10-CM | POA: Diagnosis not present

## 2020-03-01 DIAGNOSIS — E0800 Diabetes mellitus due to underlying condition with hyperosmolarity without nonketotic hyperglycemic-hyperosmolar coma (NKHHC): Secondary | ICD-10-CM | POA: Diagnosis not present

## 2020-03-01 DIAGNOSIS — D649 Anemia, unspecified: Secondary | ICD-10-CM | POA: Diagnosis not present

## 2020-03-01 NOTE — Progress Notes (Signed)
Subjective: [name] is a 75 y.o. female patient seen in office for evaluation of ulceration of the right great toe and heel ulcers. Patient denies any current pain in the feet but has some belly pain/cramping feels like she needs to go to the restroom. Patient denies nausea vomiting fever chills or any other constitutional symptoms at this time.  Patient Active Problem List   Diagnosis Date Noted  . Osteomyelitis of toe ([place]) 02/22/2020  . Bleeding 01/27/2020  . CKD (chronic kidney disease) stage 3, GFR 30-59 ml/min (HCC) 01/27/2020  . Critical lower limb ischemia ([place]) 01/04/2020  . Skin pustule 10/20/2019  . Neurocognitive deficits 08/04/2019  . Controlled type 2 diabetes mellitus without complication ([place]) 16/11/9602  . CVA (cerebral vascular accident) ([place]) 07/23/2019  . Infection of pacemaker lead wire ([place]) 07/21/2019  . Fever   . History of transcatheter aortic valve replacement (TAVR)   . Septic encephalopathy 07/18/2019  . Sacral decubitus ulcer, stage II ([place]) 07/18/2019  . Bacteremia due to group B Streptococcus 07/18/2019  . Sepsis ([place]) 07/18/2019  . Complete heart block ([place])   . Chronic diastolic congestive heart failure ([place]) 02/07/2019  . AKI (acute kidney injury) ([place]) 07/22/2017  . Peripheral arterial disease ([place]) 06/18/2017  . B12 deficiency 05/01/2017  . Bilateral lower extremity edema 11/08/2015  . Aortic atherosclerosis ([place]) 06/07/2014  . Abnormality of gait 05/29/2012  . Constipation 03/16/2012  . HOCM (hypertrophic obstructive cardiomyopathy) ([place]) 06/11/2011  . Routine health maintenance 06/10/2010  . Hyperlipidemia   . Refusal of blood transfusions as patient is Jehovah's Witness   . History of adenocarcinoma of breast   . Osteoporosis   . Status post CVA   . Aortic stenosis s/p Tissure AVR 2006   . CAD (coronary artery disease)   . Hypertension   . Depression 01/23/2006  . DM (diabetes mellitus) ([place]) 03/25/1990  . Benign hypertensive  heart and kidney disease with diastolic CHF, NYHA class 3 and CKD stage 3 ([place]) 1992  . Bradycardia 1992   Current Outpatient Medications on File Prior to Visit  Medication Sig Dispense Refill  . acetaminophen (TYLENOL) 325 MG tablet Take 650 mg by mouth every 6 (six) hours as needed. Notify MD id not relieved (Not to exceed 3000 mg ) in 24 hour per stranding order    . acetaminophen (TYLENOL) 325 MG tablet Take 650 mg by mouth in the morning.    . Amino Acids-Protein Hydrolys (FEEDING SUPPLEMENT, PRO-STAT SUGAR FREE 64,) LIQD Take 30 mLs by mouth in the morning and at bedtime.    Marland Kitchen amLODipine (NORVASC) 5 MG tablet Take 1 tablet (5 mg total) by mouth daily. 90 tablet 3  . amoxicillin (AMOXIL) 500 MG capsule Take 2,000 mg by mouth as needed. Prior to dental procedure    . aspirin 81 MG EC tablet TAKE 1 TABLET (81 MG TOTAL) BY MOUTH DAILY. SWALLOW WHOLE. 30 tablet 3  . atorvastatin (LIPITOR) 80 MG tablet Take 1 tablet (80 mg total) by mouth at bedtime. 90 tablet 3  . clopidogrel (PLAVIX) 75 MG tablet Take 1 tablet (75 mg total) by mouth daily with breakfast. 90 tablet 3  . diclofenac Sodium (VOLTAREN) 1 % GEL Apply 4 g topically 4 (four) times daily as needed (Pain). Apply to bilateral hips and knees    . doxycycline (DORYX) 100 MG EC tablet Take 100 mg by mouth 2 (two) times daily.    Marland Kitchen doxycycline (VIBRA-TABS) 100 MG tablet Take 100 mg by mouth 2 (  two) times daily.    . DULoxetine (CYMBALTA) 30 MG capsule Take 30 mg by mouth daily.    . Ensure (ENSURE) Take 237 mLs by mouth in the morning and at bedtime. Strawberry if available    . furosemide (LASIX) 80 MG tablet Take 80 mg by mouth 2 (two) times daily.    Marland Kitchen gabapentin (NEURONTIN) 100 MG capsule Take 100 mg by mouth at bedtime.    . hydrALAZINE (APRESOLINE) 50 MG tablet Take 1 tablet (50 mg total) by mouth 3 (three) times daily. 270 tablet 0  . Insulin Degludec-Liraglutide (XULTOPHY) 100-3.6 UNIT-MG/ML SOPN Inject 14 Units into the skin daily.     Marland Kitchen lactulose (CHRONULAC) 10 GM/15ML solution Take 30 g by mouth 2 (two) times daily.    . metoprolol succinate (TOPROL-XL) 25 MG 24 hr tablet Take 1 tablet (25 mg total) by mouth daily. 90 tablet 1  . Multiple Vitamins-Minerals (DECUBI-VITE) CAPS Take 1 capsule by mouth daily.    . potassium chloride SA (KLOR-CON) 20 MEQ tablet Take 20 mEq by mouth daily.     Marland Kitchen saccharomyces boulardii (FLORASTOR) 250 MG capsule Take 250 mg by mouth 2 (two) times daily.     No current facility-administered medications on file prior to visit.   Allergies  Allergen Reactions  . 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," as the patient is a Jehovah's Witness    Recent Results (from the past 2160 hour(s))  Hepatic function panel     Status: Abnormal   Collection Time: 12/09/19 12:00 AM  Result Value Ref Range   Alkaline Phosphatase 149 (A) 25 - 125  Glucose, capillary     Status: None   Collection Time: 01/04/20  9:50 AM  Result Value Ref Range   Glucose-Capillary 94 70 - 99 mg/dL    Comment: Glucose reference range applies only to samples taken after fasting for at least 8 hours.   Comment 1 Notify RN   SARS Coronavirus 2 by RT PCR (hospital order, performed in [facility] hospital lab) Nasopharyngeal Nasopharyngeal Swab     Status: None   Collection Time: 01/04/20  9:53 AM   Specimen: Nasopharyngeal Swab  Result Value Ref Range   SARS Coronavirus 2 NEGATIVE NEGATIVE    Comment: (NOTE) SARS-CoV-2 target nucleic acids are NOT DETECTED.  The SARS-CoV-2 RNA is generally detectable in upper and lower respiratory specimens during the acute phase of infection. The lowest concentration of SARS-CoV-2 viral copies this assay can detect is 250 copies / mL. A negative result does not preclude SARS-CoV-2 infection and should not be used as the sole basis for treatment or other patient management decisions.  A negative result may occur with improper specimen collection / handling, submission of  specimen other than nasopharyngeal swab, presence of viral mutation(s) within the areas targeted by this assay, and inadequate number of viral copies (<250 copies / mL). A negative result must be combined with clinical observations, patient history, and epidemiological information.  Fact Sheet for Patients:   StrictlyIdeas.no  Fact Sheet for Healthcare Providers: BankingDealers.co.za  This test is not yet approved or  cleared by the [place] FDA and has been authorized for detection and/or diagnosis of SARS-CoV-2 by FDA under an Emergency Use Authorization (EUA).  This EUA will remain in effect (meaning this test can be used) for the duration of the COVID-19 declaration under Section 564(b)(1) of the Act, 21 U.S.C. section 360bbb-3(b)(1), unless the authorization is terminated or revoked sooner.  Performed at [facility], [place] [address] [place], [place] [address]   Potassium     Status: None   Collection Time: 01/04/20 11:29 AM  Result Value Ref Range   Potassium 4.5 3.5 - 5.1 mmol/L    Comment: Performed at [facility] [address] [place], [place] [address]  I-STAT, [place]     Status: Abnormal   Collection Time: 01/04/20 12:22 PM  Result Value Ref Range   Sodium 139 135 - 145 mmol/L   Potassium 4.5 3.5 - 5.1 mmol/L   Chloride 107 98 - 111 mmol/L   BUN 56 (H) 8 - 23 mg/dL   Creatinine, Ser 2.80 (H) 0.44 - 1.00 mg/dL   Glucose, Bld 109 (H) 70 - 99 mg/dL    Comment: Glucose reference range applies only to samples taken after fasting for at least 8 hours.   Calcium, Ion 1.08 (L) 1.15 - 1.40 mmol/L   TCO2 24 22 - 32 mmol/L   Hemoglobin 12.6 12.0 - 15.0 g/dL   HCT 37.0 36.0 - 46.0 %  POCT Activated clotting time     Status: None   Collection Time: 01/04/20  4:12 PM  Result Value Ref Range   Activated Clotting Time 274 seconds  Glucose, capillary     Status: Abnormal   Collection Time: 01/04/20  5:07 PM   Result Value Ref Range   Glucose-Capillary 106 (H) 70 - 99 mg/dL    Comment: Glucose reference range applies only to samples taken after fasting for at least 8 hours.  Hemoglobin A1c     Status: Abnormal   Collection Time: 01/04/20  7:43 PM  Result Value Ref Range   Hgb A1c MFr Bld 6.3 (H) 4.8 - 5.6 %    Comment: (NOTE) Pre diabetes:          5.7%-6.4%  Diabetes:              >6.4%  Glycemic control for   <7.0% adults with diabetes    Mean Plasma Glucose 134.11 mg/dL    Comment: Performed at [facility] [address] [place], [place] [address]  Glucose, capillary     Status: Abnormal   Collection Time: 01/04/20 11:36 PM  Result Value Ref Range   Glucose-Capillary 197 (H) 70 - 99 mg/dL    Comment: Glucose reference range applies only to samples taken after fasting for at least 8 hours.  Glucose, capillary     Status: Abnormal   Collection Time: 01/05/20  5:48 AM  Result Value Ref Range   Glucose-Capillary 103 (H) 70 - 99 mg/dL    Comment: Glucose reference range applies only to samples taken after fasting for at least 8 hours.  Glucose, capillary     Status: Abnormal   Collection Time: 01/05/20 11:01 AM  Result Value Ref Range   Glucose-Capillary 158 (H) 70 - 99 mg/dL    Comment: Glucose reference range applies only to samples taken after fasting for at least 8 hours.  Glucose, capillary     Status: Abnormal   Collection Time: 01/05/20  4:50 PM  Result Value Ref Range   Glucose-Capillary 117 (H) 70 - 99 mg/dL    Comment: Glucose reference range applies only to samples taken after fasting for at least 8 hours.  CBC and differential     Status: None   Collection Time: 01/13/20 12:00 AM  Result Value Ref Range   Hemoglobin 13.6 12.0 - 16.0  HCT 42 36 - 46   Neutrophils Absolute 8.40    Platelets 246 150 - 399   WBC 12.0   CBC     Status: None   Collection Time: 01/13/20 12:00 AM  Result Value Ref Range   RBC 4.59 3.87 - 7.90  Basic metabolic panel     Status:  Abnormal   Collection Time: 01/13/20 12:00 AM  Result Value Ref Range   Glucose 129    BUN 80 (A) 4 - 21   CO2 23 (A) 13 - 22   Creatinine 2.4 (A) 0.5 - 1.1   Potassium 4.6 3.4 - 5.3   Sodium 134 (A) 137 - 147   Chloride 95 (A) 99 - 108  Comprehensive metabolic panel     Status: None   Collection Time: 01/13/20 12:00 AM  Result Value Ref Range   GFR calc Af Amer 21.99    GFR calc non Af Amer 18.97    Calcium 9.1 8.7 - 10.7  CBC and differential     Status: Abnormal   Collection Time: 01/17/20 12:00 AM  Result Value Ref Range   Hemoglobin 11.7 (A) 12.0 - 16.0   HCT 36 36 - 46   Neutrophils Absolute 5.10    Platelets 212 150 - 399   WBC 8.7   CBC     Status: None   Collection Time: 01/17/20 12:00 AM  Result Value Ref Range   RBC 3.95 3.87 - 2.40  Basic metabolic panel     Status: Abnormal   Collection Time: 01/17/20 12:00 AM  Result Value Ref Range   Glucose 102    BUN 80 (A) 4 - 21   CO2 23 (A) 13 - 22   Creatinine 2.0 (A) 0.5 - 1.1   Potassium 4.3 3.4 - 5.3   Sodium 137 137 - 147   Chloride 103 99 - 108  Comprehensive metabolic panel     Status: None   Collection Time: 01/17/20 12:00 AM  Result Value Ref Range   GFR calc Af Amer 27.91    GFR calc non Af Amer 24.08    Calcium 8.7 8.7 - 97.3  Basic metabolic panel     Status: Abnormal   Collection Time: 01/25/20 12:00 AM  Result Value Ref Range   Glucose 81    BUN 64 (A) 4 - 21   CO2 27 (A) 13 - 22   Creatinine 1.8 (A) 0.5 - 1.1   Potassium 4.7 3.4 - 5.3   Sodium 138 137 - 147   Chloride 101 99 - 108  Comprehensive metabolic panel     Status: None   Collection Time: 01/25/20 12:00 AM  Result Value Ref Range   GFR calc Af Amer 31.77    GFR calc non Af Amer 27.41    Calcium 8.7 8.7 - 10.7  CBC and differential     Status: Abnormal   Collection Time: 01/26/20 12:00 AM  Result Value Ref Range   Hemoglobin 11.1 (A) 12.0 - 16.0   HCT 34 (A) 36 - 46   Neutrophils Absolute 5.60    Platelets 190 150 - 399   WBC  9.2   CBC     Status: Abnormal   Collection Time: 01/26/20 12:00 AM  Result Value Ref Range   RBC 3.74 (A) 3.87 - 5.11  CBC and differential     Status: Abnormal   Collection Time: 01/30/20 12:00 AM  Result Value Ref Range   Hemoglobin 11.5 (  A) 12.0 - 16.0   HCT 35 (A) 36 - 46   Neutrophils Absolute 4.50    Platelets 197 150 - 399   WBC 8.0   CBC     Status: Abnormal   Collection Time: 01/30/20 12:00 AM  Result Value Ref Range   RBC 3.86 (A) 3.87 - 5.11  House Account Tracking only     Status: None   Collection Time: 02/02/20 12:13 PM  Result Value Ref Range   Tracking House Account      Comment: We were unable to identify an account number for the order submitted. If you do not have a [facility]  account number or if your account information needs  to be updated please call [phone]) for assistance. . To prevent delays in testing and processing of your orders please provide the following information for this order and with every additional order submitted: [facility] account number and account name  Client address Client phone and fax number NPI number of ordering physician along with the physician name.   WOUND CULTURE     Status: None   Collection Time: 02/02/20 12:13 PM  Result Value Ref Range   MICRO NUMBER: [id number]    SPECIMEN QUALITY: Adequate    SOURCE: RT GREAT TOE    STATUS: FINAL    GRAM STAIN:      No white blood cells seen Rare epithelial cells Many Gram positive cocci in clusters   RESULT:      Growth of skin flora (note: Growth does not include S. aureus, beta-hemolytic Streptococci or P. aeruginosa).  Resp Panel by RT-PCR (Flu A&B, Covid) Nasopharyngeal Swab     Status: None   Collection Time: 02/08/20  6:10 PM   Specimen: Nasopharyngeal Swab; Nasopharyngeal(NP) swabs in vial transport medium  Result Value Ref Range   SARS Coronavirus 2 by RT PCR NEGATIVE NEGATIVE    Comment: (NOTE) SARS-CoV-2 target nucleic acids are NOT  DETECTED.  The SARS-CoV-2 RNA is generally detectable in upper respiratory specimens during the acute phase of infection. The lowest concentration of SARS-CoV-2 viral copies this assay can detect is 138 copies/mL. A negative result does not preclude SARS-Cov-2 infection and should not be used as the sole basis for treatment or other patient management decisions. A negative result may occur with  improper specimen collection/handling, submission of specimen other than nasopharyngeal swab, presence of viral mutation(s) within the areas targeted by this assay, and inadequate number of viral copies(<138 copies/mL). A negative result must be combined with clinical observations, patient history, and epidemiological information. The expected result is Negative.  Fact Sheet for Patients:  BloggerCourse.com  Fact Sheet for Healthcare Providers:  SeriousBroker.it  This test is no t yet approved or cleared by the [place] FDA and  has been authorized for detection and/or diagnosis of SARS-CoV-2 by FDA under an Emergency Use Authorization (EUA). This EUA will remain  in effect (meaning this test can be used) for the duration of the COVID-19 declaration under Section 564(b)(1) of the Act, 21 U.S.C.section 360bbb-3(b)(1), unless the authorization is terminated  or revoked sooner.       Influenza A by PCR NEGATIVE NEGATIVE   Influenza B by PCR NEGATIVE NEGATIVE    Comment: (NOTE) The Xpert Xpress SARS-CoV-2/FLU/RSV plus assay is intended as an aid in the diagnosis of influenza from Nasopharyngeal swab specimens and should not be used as a sole basis for treatment. Nasal washings and aspirates are unacceptable for Xpert Xpress SARS-CoV-2/FLU/RSV testing.  Fact Sheet for Patients: EntrepreneurPulse.com.au  Fact Sheet for Healthcare Providers: IncredibleEmployment.be  This test is not yet approved or  cleared by the [place] FDA and has been authorized for detection and/or diagnosis of SARS-CoV-2 by FDA under an Emergency Use Authorization (EUA). This EUA will remain in effect (meaning this test can be used) for the duration of the COVID-19 declaration under Section 564(b)(1) of the Act, 21 U.S.C. section 360bbb-3(b)(1), unless the authorization is terminated or revoked.  Performed at [facility], [place] [address], [place], [place] [address]   Comprehensive metabolic panel     Status: Abnormal   Collection Time: 02/08/20  6:27 PM  Result Value Ref Range   Sodium 139 135 - 145 mmol/L   Potassium 4.5 3.5 - 5.1 mmol/L   Chloride 104 98 - 111 mmol/L   CO2 23 22 - 32 mmol/L   Glucose, Bld 81 70 - 99 mg/dL    Comment: Glucose reference range applies only to samples taken after fasting for at least 8 hours.   BUN 115 (H) 8 - 23 mg/dL   Creatinine, Ser 2.17 (H) 0.44 - 1.00 mg/dL   Calcium 9.3 8.9 - 10.3 mg/dL   Total Protein 7.0 6.5 - 8.1 g/dL   Albumin 2.4 (L) 3.5 - 5.0 g/dL   AST 34 15 - 41 U/L   ALT 43 0 - 44 U/L   Alkaline Phosphatase 161 (H) 38 - 126 U/L   Total Bilirubin 0.6 0.3 - 1.2 mg/dL   GFR, Estimated 23 (L) >60 mL/min    Comment: (NOTE) Calculated using the CKD-EPI Creatinine Equation (2021)    Anion gap 12 5 - 15    Comment: Performed at [facility] [address] [place], [place] [address]  CBC with Differential     Status: Abnormal   Collection Time: 02/08/20  6:27 PM  Result Value Ref Range   WBC 9.4 4.0 - 10.5 K/uL   RBC 4.39 3.87 - 5.11 MIL/uL   Hemoglobin 13.2 12.0 - 15.0 g/dL   HCT 40.8 36.0 - 46.0 %   MCV 92.9 80.0 - 100.0 fL   MCH 30.1 26.0 - 34.0 pg   MCHC 32.4 30.0 - 36.0 g/dL   RDW 15.4 11.5 - 15.5 %   Platelets 340 150 - 400 K/uL   nRBC 0.2 0.0 - 0.2 %   Neutrophils Relative % 59 %   Neutro Abs 5.6 1.7 - 7.7 K/uL   Lymphocytes Relative 25 %   Lymphs Abs 2.3 0.7 - 4.0 K/uL   Monocytes Relative 12 %   Monocytes Absolute 1.1  (H) 0.1 - 1.0 K/uL   Eosinophils Relative 3 %   Eosinophils Absolute 0.3 0.0 - 0.5 K/uL   Basophils Relative 0 %   Basophils Absolute 0.0 0.0 - 0.1 K/uL   Immature Granulocytes 1 %   Abs Immature Granulocytes 0.06 0.00 - 0.07 K/uL    Comment: Performed at [facility] [address], [place], [place] [address]  TSH     Status: None   Collection Time: 02/08/20  6:27 PM  Result Value Ref Range   TSH 4.030 0.350 - 4.500 uIU/mL    Comment: Performed by a 3rd Generation assay with a functional sensitivity of <=0.01 uIU/mL. Performed at [facility], [place] [address] [place], [place] [address]   Basic metabolic panel     Status: Abnormal   Collection Time: 02/13/20 12:00 AM  Result Value Ref  Range   Glucose 105    BUN 106 (A) 4 - 21   CO2 22 13 - 22   Creatinine 1.9 (A) 0.5 - 1.1   Potassium 4.4 3.4 - 5.3   Sodium 134 (A) 137 - 147   Chloride 97 (A) 99 - 108  Comprehensive metabolic panel     Status: Abnormal   Collection Time: 02/13/20 12:00 AM  Result Value Ref Range   Globulin 3.5    GFR calc Af Amer 28.78    GFR calc non Af Amer 24.83    Calcium 9.0 8.7 - 10.7   Albumin 2.9 (A) 3.5 - 5.0  Hepatic function panel     Status: Abnormal   Collection Time: 02/13/20 12:00 AM  Result Value Ref Range   Alkaline Phosphatase 222 (A) 25 - 125   ALT 49 (A) 7 - 35   AST 51 (A) 13 - 35   Bilirubin, Total 0.3   Basic metabolic panel     Status: Abnormal   Collection Time: 02/15/20 12:00 AM  Result Value Ref Range   Glucose 104    BUN 101 (A) 4 - 21   CO2 22 13 - 22   Creatinine 1.6 (A) 0.5 - 1.1   Potassium 4.1 3.4 - 5.3   Sodium 136 (A) 137 - 147   Chloride 101 99 - 108  Comprehensive metabolic panel     Status: None   Collection Time: 02/15/20 12:00 AM  Result Value Ref Range   GFR calc Af Amer 35.08    GFR calc non Af Amer 30.27    Calcium 8.7 8.7 - 10.7    Objective: There were no vitals filed for this visit.  General: Patient is awake, alert, oriented x 3  and in no acute distress.  Dermatology: Skin is warm and dry bilateral with a full thickness ulceration present distal tuft of the right great toe  Ulceration measures 1cm x 0.5 cm x 0.2 cm. There is a dry eschar/keratotic border, no malodor, no erythema, localized edema. No other acute signs of infection.  To the right heel there is a dry eschar noted that measures 2 x 2 cm with eschar base no active drainage no bogginess there is mild edema but no malodor unchanged from prior.  To the left there is a stage 2 ulceration to heel that measures 2x2cm with 100% granular base  does not probe to bone and no redness, no warmth, no active drainage, appears to be healing well.   Vascular: Dorsalis Pedis pulse = 0/4 Bilateral,  Posterior Tibial pulse = 0/4 Bilateral,  Capillary Fill Time < 5 seconds  Neurologic: Protective sensation severely diminished with history of neuropathy.  Musculosketal: There is minimal pain with palpation to ulcerated areas on the right or left.  Severe end-stage bunion deformity.  No pain with compression to calves bilateral.   Recent Labs    07/17/19 2150  LABORGA GROUP B STREP(S.AGALACTIAE)ISOLATED    Assessment and Plan:  Problem List Items Addressed This Visit      Endocrine   DM (diabetes mellitus) ([place])    Other Visit Diagnoses    Decubitus ulcer of heel, right, unstageable ([place])    -  Primary   Decubitus ulcer of left heel, stage 2 ([place])       Osteomyelitis of great toe of right foot ([place])       PAD (peripheral artery disease) ([place])         -Examined patient and  discussed the progression of the wounds and treatment alternatives. -Cleansed ulceration using a saline moistened gauze debrided any loose tissue to the area at the right great toe and heels. Hemostasis was achieved with manuel pressure. Patient tolerated nonselective debridement procedure well without any discomfort or anesthesia necessary for this wound debridement.  -Applied Betadine and dry  sterile dressing and wrote orders for the facility to continue with the same -Referral made to [facility] wound care center for other wound treatment alternatives 2 weeks ago and advised facility to contact wound center for follow up  -Continue with Doxycyline  -Continue with pillow offloading of heel to prevent worsening of heel wounds when in bed like previous -Continue with vascular follow-up due to ischemic nature of wounds - Advised patient to go to the ER or return to office if the wound worsens or if constitutional symptoms are present. -Return after wound care center or sooner problems or issues arise  Landis Martins, DPM

## 2020-03-02 DIAGNOSIS — D649 Anemia, unspecified: Secondary | ICD-10-CM | POA: Diagnosis not present

## 2020-03-03 DIAGNOSIS — I69354 Hemiplegia and hemiparesis following cerebral infarction affecting left non-dominant side: Secondary | ICD-10-CM | POA: Diagnosis not present

## 2020-03-03 DIAGNOSIS — R279 Unspecified lack of coordination: Secondary | ICD-10-CM | POA: Diagnosis not present

## 2020-03-03 DIAGNOSIS — M6281 Muscle weakness (generalized): Secondary | ICD-10-CM | POA: Diagnosis not present

## 2020-03-05 ENCOUNTER — Telehealth: Payer: Self-pay | Admitting: Sports Medicine

## 2020-03-05 DIAGNOSIS — L97512 Non-pressure chronic ulcer of other part of right foot with fat layer exposed: Secondary | ICD-10-CM | POA: Diagnosis not present

## 2020-03-05 DIAGNOSIS — L89152 Pressure ulcer of sacral region, stage 2: Secondary | ICD-10-CM | POA: Diagnosis not present

## 2020-03-05 DIAGNOSIS — L97412 Non-pressure chronic ulcer of right heel and midfoot with fat layer exposed: Secondary | ICD-10-CM | POA: Diagnosis not present

## 2020-03-05 NOTE — Telephone Encounter (Signed)
Pt daughter called back, she works at [employer] that's why she missed Dr. Cannon Kettle call. [name] wanted to know what was going on with her mother's foot. She stated that the director where her mom lives said she was diagnosed with gangrene. Pt daughter wanted to know what the treatment plan, they don't know what to do next.Marland Kitchen Her mom is not eating much at all, is this the side affect? Please call her ASAP and she will be waiting for your call.

## 2020-03-05 NOTE — Telephone Encounter (Signed)
FYI I called and spoke with the Daughter and made her aware of my recommendations; wound care center referal. Her mom can definitely come back to my office to see me after she has been evaluated by the wound center. Also I advised daughter that she can access Mychart to get information on labs or appointment notes. Also made daughter aware the infection can progress to what is considered gangrene especially since her mom has PAD and that they should keep follow up with the vascular doctor as well. Daughter thanked me for taking the time to call back and answer her ?s. I also recommended that if her mom is still not eating and complaining of belly pain to notify her facility staff and doctor; she may need further work up. -Dr. Cannon Kettle

## 2020-03-05 NOTE — Telephone Encounter (Signed)
Thank you soo much

## 2020-03-05 NOTE — Telephone Encounter (Signed)
Patients daughter [name]) has requested call back regarding moms treatment plan, she also has some questions for you, Please advise

## 2020-03-05 NOTE — Telephone Encounter (Signed)
I called and left voicemail since daughter did not answer instructing her to call the office back to let me know what ?s she has Thanks Dr. Chauncey Cruel

## 2020-03-06 DIAGNOSIS — E1129 Type 2 diabetes mellitus with other diabetic kidney complication: Secondary | ICD-10-CM | POA: Diagnosis not present

## 2020-03-06 DIAGNOSIS — N1832 Chronic kidney disease, stage 3b: Secondary | ICD-10-CM | POA: Diagnosis not present

## 2020-03-06 DIAGNOSIS — R627 Adult failure to thrive: Secondary | ICD-10-CM | POA: Diagnosis not present

## 2020-03-06 DIAGNOSIS — I2510 Atherosclerotic heart disease of native coronary artery without angina pectoris: Secondary | ICD-10-CM | POA: Diagnosis not present

## 2020-03-06 DIAGNOSIS — E785 Hyperlipidemia, unspecified: Secondary | ICD-10-CM | POA: Diagnosis not present

## 2020-03-06 DIAGNOSIS — I129 Hypertensive chronic kidney disease with stage 1 through stage 4 chronic kidney disease, or unspecified chronic kidney disease: Secondary | ICD-10-CM | POA: Diagnosis not present

## 2020-03-06 DIAGNOSIS — Z853 Personal history of malignant neoplasm of breast: Secondary | ICD-10-CM | POA: Diagnosis not present

## 2020-03-06 DIAGNOSIS — I350 Nonrheumatic aortic (valve) stenosis: Secondary | ICD-10-CM | POA: Diagnosis not present

## 2020-03-14 ENCOUNTER — Ambulatory Visit (INDEPENDENT_AMBULATORY_CARE_PROVIDER_SITE_OTHER): Payer: Medicare Other | Admitting: Physician Assistant

## 2020-03-14 ENCOUNTER — Ambulatory Visit (HOSPITAL_COMMUNITY): Payer: Medicare Other | Attending: Cardiovascular Disease

## 2020-03-14 ENCOUNTER — Other Ambulatory Visit: Payer: Self-pay

## 2020-03-14 ENCOUNTER — Encounter: Payer: Self-pay | Admitting: Physician Assistant

## 2020-03-14 VITALS — BP 110/70 | HR 62 | Ht 65.0 in | Wt 137.0 lb

## 2020-03-14 DIAGNOSIS — I421 Obstructive hypertrophic cardiomyopathy: Secondary | ICD-10-CM | POA: Diagnosis not present

## 2020-03-14 DIAGNOSIS — Z952 Presence of prosthetic heart valve: Secondary | ICD-10-CM

## 2020-03-14 DIAGNOSIS — I7025 Atherosclerosis of native arteries of other extremities with ulceration: Secondary | ICD-10-CM

## 2020-03-14 DIAGNOSIS — I050 Rheumatic mitral stenosis: Secondary | ICD-10-CM

## 2020-03-14 DIAGNOSIS — R627 Adult failure to thrive: Secondary | ICD-10-CM | POA: Diagnosis not present

## 2020-03-14 LAB — ECHOCARDIOGRAM COMPLETE
AR max vel: 1.74 cm2
AV Area VTI: 1.64 cm2
AV Area mean vel: 1.93 cm2
AV Mean grad: 8 mmHg
AV Peak grad: 14.7 mmHg
Ao pk vel: 1.92 m/s
Area-P 1/2: 1.75 cm2
Height: 65 in
MV VTI: 1.08 cm2
S' Lateral: 1.7 cm
Weight: 2192 oz

## 2020-03-14 MED ORDER — FUROSEMIDE 80 MG PO TABS: 80.0000 mg | ORAL_TABLET | Freq: Every day | ORAL | 3 refills | Status: DC

## 2020-03-14 NOTE — Patient Instructions (Addendum)
Medication Instructions:  Stay on all your same medications and try to increase oral intake   Follow-Up: Dr. Francesca Oman nurse will call you to arrange your 3 month visit.
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
## 2020-03-14 NOTE — Addendum Note (Signed)
Addended by: Harland German A on: 03/14/2020 09:31 PM   Modules accepted: Orders

## 2020-03-16 ENCOUNTER — Encounter: Payer: Self-pay | Admitting: Adult Health

## 2020-03-16 ENCOUNTER — Non-Acute Institutional Stay (SKILLED_NURSING_FACILITY): Payer: Medicare Other | Admitting: Adult Health

## 2020-03-16 DIAGNOSIS — I10 Essential (primary) hypertension: Secondary | ICD-10-CM

## 2020-03-16 DIAGNOSIS — G629 Polyneuropathy, unspecified: Secondary | ICD-10-CM | POA: Diagnosis not present

## 2020-03-16 DIAGNOSIS — E1140 Type 2 diabetes mellitus with diabetic neuropathy, unspecified: Secondary | ICD-10-CM

## 2020-03-16 DIAGNOSIS — Z794 Long term (current) use of insulin: Secondary | ICD-10-CM | POA: Diagnosis not present

## 2020-03-16 DIAGNOSIS — M869 Osteomyelitis, unspecified: Secondary | ICD-10-CM

## 2020-03-16 NOTE — Progress Notes (Signed)
Location:  [facility] Room Number: 217 Place of Service:  SNF (31) Provider:  Durenda Age, DNP, FNP-BC  Patient Care Team: Hendricks Limes, MD as PCP - General (Internal Medicine) Dorothy Spark, MD as PCP - Cardiology (Cardiology) Constance Haw, MD as PCP - Electrophysiology (Cardiology) Medina-Vargas, Senaida Lange, NP as Nurse Practitioner (Internal Medicine) Rehab, [name]  Extended Emergency Contact Information Primary Emergency Contact: [name] [place] of [place] Phone: [phone] Mobile Phone: [phone] Relation: Daughter Secondary Emergency Contact: [name],[name]  [place] Phone: [phone] Relation: Daughter  Code Status:  FULL CODE  Goals of care: Advanced Directive information Advanced Directives 03/16/2020  Does Patient Have a Medical Advance Directive? No  Type of Advance Directive -  Does patient want to make changes to medical advance directive? -  Copy of [place] in Chart? -  Would patient like information on creating a medical advance directive? No - Patient declined  Pre-existing out of facility DNR order (yellow form or pink MOST form) -     Chief Complaint  Patient presents with  . Medical Management of Chronic Issues    Routine [facility] visit    HPI:  Pt is a 75 y.o. female seen today for medical management of chronic diseases.  She is a long-term care resident of [facility].  She has a PMH of chronic kidney disease, CAD, severe aortic stenosis, history of breast cancer, essential hypertension, dyslipidemia, stroke and chronic diastolic CHF. SBPs ranging from 99 to 115. She takes amlodipine 5 mg daily, metoprolol succinate ER 25 mg daily and hydralazine 50 mg three times a day for hypertension. She denies having pain on her lower extremities. She takes Neurontin 100 mg at  bedtime for neuropathy. CBGs ranging from 85-223. She takes Xultophy 14 units daily for diabetes mellitus.   Past Medical History:  Diagnosis Date  . Acute on chronic diastolic CHF (congestive heart failure) ([place]) 06/22/2017  . Acute on chronic renal failure ([place]) 05/25/2011  . Adenocarcinoma of breast ([place]) 1996   Completed tamoxifen and had mastectomy.  . Aortic stenosis, severe    s/p aortic valve replacement with porcine valve 06/2004.  ECHO 2010 EF 07%, LVH, diastolic dysfxn, Bioprostetic aoritc valve, mild AS. ECHO 2013 EF 60%, Nl aortic artificial valve, dynamic obstruction in the outflow tract   Class IIb rec for annual TTE after 5 yrs. She had a TTE 2013    . Arthritis   . CAD (coronary artery disease) 2006   s/p CABG (5/06) w/ saphenous vein to RCA at time of AVR  . CKD (chronic kidney disease) stage 2, GFR 60-89 ml/min 06/03/2011   She is no longer taking NSAIDs.   Marland Kitchen CVA (cerebral infarction) 2006   Post-op from AVR. Presumed embolic in nature. Carotid stenosis of R 60-79%. Repeat dopplers 4/10 no R stenosis and L stenosis of 1-29%.  . Depression    Controlled on Paxil  . Diabetes mellitus 1992   Dx 04/25/1990. Now insulin dependent, started 2008. On ACEI.   . Diverticulosis 2001  . History of 2019 novel coronavirus disease (COVID-19)   . Hyperlipidemia    Mgmt with a statin  . Hypertension    Requires 4 drug tx  . Osteoporosis 2006   DEXA 10/06 : L femur T -2.8, R -2.7. Lumbar T -2.4. On bisphosphonates and  Calcium / Vit D.  . Peripheral vascular disease ([place])   .  Presence of permanent cardiac pacemaker   . Refusal of blood transfusions as patient is Jehovah's Witness   . Stroke [facility])    Past Surgical History:  Procedure Laterality Date  . ABDOMINAL HYSTERECTOMY  1987   for fibroids  . AORTIC VALVE REPLACEMENT  2006  . CHOLECYSTECTOMY    . CORONARY ARTERY BYPASS GRAFT  2006   Saphenous vein to RCA at time of AVR. Course complicated by acute respiratory failure, post-op  PTX, ARI, ileus, CVA  . LOWER EXTREMITY ANGIOGRAPHY N/A 01/04/2020   Procedure: LOWER EXTREMITY ANGIOGRAPHY - Right;  Surgeon: Cherre Robins, MD;  Location: [place] CV LAB;  Service: Cardiovascular;  Laterality: N/A;  . MASTECTOMY Left 1995   L for adenocarcinoma  . PACEMAKER IMPLANT N/A 02/09/2019   Procedure: PACEMAKER IMPLANT;  Surgeon: Constance Haw, MD;  Location: [place] CV LAB;  Service: Cardiovascular;  Laterality: N/A;  . PERIPHERAL VASCULAR BALLOON ANGIOPLASTY  01/04/2020   Procedure: PERIPHERAL VASCULAR BALLOON ANGIOPLASTY;  Surgeon: Cherre Robins, MD;  Location: [place] CV LAB;  Service: Cardiovascular;;  Rt AT  . RIGHT HEART CATH AND CORONARY/GRAFT ANGIOGRAPHY N/A 02/09/2019   Procedure: RIGHT HEART CATH AND CORONARY/GRAFT ANGIOGRAPHY;  Surgeon: Sherren Mocha, MD;  Location: [place] CV LAB;  Service: Cardiovascular;  Laterality: N/A;  . TEE WITHOUT CARDIOVERSION N/A 07/20/2019   Procedure: TRANSESOPHAGEAL ECHOCARDIOGRAM (TEE);  Surgeon: Jerline Pain, MD;  Location: [facility] ENDOSCOPY;  Service: Cardiovascular;  Laterality: N/A;  . TRANSCATHETER AORTIC VALVE REPLACEMENT, TRANSFEMORAL N/A 03/01/2019   Procedure: TRANSCATHETER AORTIC VALVE REPLACEMENT, TRANSFEMORAL;  Surgeon: Sherren Mocha, MD;  Location: [place] CV LAB;  Service: Open Heart Surgery;  Laterality: N/A;    Allergies  Allergen Reactions  . 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," as the patient is a Jehovah's Witness    Outpatient Encounter Medications as of 03/16/2020  Medication Sig  . acetaminophen (TYLENOL) 325 MG tablet Take 650 mg by mouth every 6 (six) hours as needed. Notify MD id not relieved (Not to exceed 3000 mg ) in 24 hour per stranding order  . acetaminophen (TYLENOL) 325 MG tablet Take 650 mg by mouth in the morning.  . Amino Acids-Protein Hydrolys (FEEDING SUPPLEMENT, PRO-STAT SUGAR FREE 64,) LIQD Take 30 mLs by mouth in the morning and at bedtime.  Marland Kitchen  amLODipine (NORVASC) 5 MG tablet Take 1 tablet (5 mg total) by mouth daily.  Marland Kitchen amoxicillin (AMOXIL) 500 MG capsule Take 2,000 mg by mouth as needed. Prior to dental procedure  . aspirin 81 MG EC tablet TAKE 1 TABLET (81 MG TOTAL) BY MOUTH DAILY. SWALLOW WHOLE.  Marland Kitchen atorvastatin (LIPITOR) 80 MG tablet Take 1 tablet (80 mg total) by mouth at bedtime.  . clopidogrel (PLAVIX) 75 MG tablet Take 1 tablet (75 mg total) by mouth daily with breakfast.  . diclofenac Sodium (VOLTAREN) 1 % GEL Apply 4 g topically 4 (four) times daily as needed (Pain). Apply to bilateral hips and knees  . DULoxetine (CYMBALTA) 30 MG capsule Take 30 mg by mouth daily.  . Ensure (ENSURE) Take 237 mLs by mouth in the morning and at bedtime. Strawberry if available  . gabapentin (NEURONTIN) 100 MG capsule Take 100 mg by mouth at bedtime.  . hydrALAZINE (APRESOLINE) 50 MG tablet Take 1 tablet (50 mg total) by mouth 3 (three) times daily.  . Insulin Degludec-Liraglutide (XULTOPHY) 100-3.6 UNIT-MG/ML SOPN Inject 14 Units into the skin daily.  Marland Kitchen lactulose (CHRONULAC) 10 GM/15ML solution  Take 30 g by mouth 2 (two) times daily.  . metoprolol succinate (TOPROL-XL) 25 MG 24 hr tablet Take 1 tablet (25 mg total) by mouth daily.  . Multiple Vitamins-Minerals (DECUBI-VITE) CAPS Take 1 capsule by mouth daily.  . potassium chloride SA (KLOR-CON) 20 MEQ tablet Take 20 mEq by mouth 2 (two) times daily.  Marland Kitchen saccharomyces boulardii (FLORASTOR) 250 MG capsule Take 250 mg by mouth 2 (two) times daily.   No facility-administered encounter medications on file as of 03/16/2020.    Review of Systems  GENERAL: Poor appetite MOUTH and THROAT: Denies oral discomfort, gingival pain or bleeding RESPIRATORY: no cough, SOB, DOE, wheezing, hemoptysis CARDIAC: No chest pain, edema or palpitations GI: No abdominal pain, diarrhea, constipation, heart burn, nausea or vomiting GU: Denies dysuria, frequency, hematuria or discharge NEUROLOGICAL: Denies dizziness,  syncope, numbness, or headache PSYCHIATRIC: Denies feelings of depression or anxiety. No report of hallucinations, insomnia, paranoia, or agitation    Immunization History  Administered Date(s) Administered  . Fluad Quad(high Dose 65+) 02/11/2019  . Influenza Split 11/12/2010, 11/10/2011  . Influenza Whole 11/07/2009  . Influenza,inj,Quad PF,6+ Mos 10/19/2012, 01/05/2014, 11/08/2015, 01/29/2017  . Influenza-Unspecified 12/15/2019  . Moderna Sars-Covid-2 Vaccination 03/09/2019, 04/06/2019  . Pneumococcal Conjugate-13 06/07/2014  . Pneumococcal Polysaccharide-23 12/26/1994, 03/05/2010  . Td 09/24/1993  . Tdap 11/12/2010, 07/12/2013   Pertinent  Health Maintenance Due  Topic Date Due  . OPHTHALMOLOGY EXAM  11/07/2016  . FOOT EXAM  07/10/2017  . LIPID PANEL  02/07/2020  . HEMOGLOBIN A1C  04/05/2020  . INFLUENZA VACCINE  Completed  . DEXA SCAN  Completed  . PNA vac Low Risk Adult  Completed   Fall Risk  01/10/2020 08/02/2019 06/16/2019 05/19/2019 05/04/2019  Falls in the past year? $RemoveBe'1 1 1 1 'bcvciarqc$ 0  Comment - - pt denies-but dtr stated pt had a fall in last mths pt denies falls-but dtr states pt has had a fall -  Number falls in past yr: 1 1 0 0 -  Injury with Fall? 0 0 0 0 -  Risk Factor Category  - - - - -  Risk for fall due to : History of fall(s);Impaired balance/gait Impaired mobility History of fall(s);Impaired balance/gait;Impaired mobility;Medication side effect History of fall(s);Impaired balance/gait;Impaired mobility;Medication side effect No Fall Risks  Risk for fall due to: Comment - - - - -  Follow up Falls evaluation completed;Education provided;Falls prevention discussed Falls evaluation completed Falls evaluation completed;Falls prevention discussed Falls evaluation completed Falls evaluation completed     Vitals:   03/16/20 1423  BP: 113/64  Pulse: 88  Temp: (!) 97 F (36.1 C)  TempSrc: Oral  Weight: 137 lb (62.1 kg)  Height: $Remove'5\' 5"'UnJsfhN$  (1.651 m)   Body mass index is  22.8 kg/m.  Physical Exam  GENERAL APPEARANCE: Well nourished. In no acute distress. Normal body habitus SKIN:  Right heel and right great toe wound with dressing MOUTH and THROAT: Lips are without lesions. Oral mucosa is moist and without lesions.  RESPIRATORY: Breathing is even & unlabored, BS CTAB CARDIAC: RRR, no murmur,no extra heart sounds, no edema GI: Abdomen soft, normal BS, no masses, no tenderness NEUROLOGICAL: There is no tremor. Speech is clear. Alert to self and place, disoriented to time. PSYCHIATRIC:  Affect and behavior are appropriate  Labs reviewed: Recent Labs    07/20/19 0301 07/21/19 0356 08/23/19 1701 09/20/19 0000 01/04/20 1222 01/13/20 0000 02/08/20 1827 02/13/20 0000 02/15/20 0000  NA 137   < > 143   < >  139   < > 139 134* 136*  K 3.6   < > 4.4   < > 4.5   < > 4.5 4.4 4.1  CL 98   < > 102   < > 107   < > 104 97* 101  CO2 27   < > 25   < >  --    < > $R'23 22 22  'Vl$ GLUCOSE 239*   < > 121*  --  109*  --  81  --   --   BUN 26*   < > 44*   < > 56*   < > 115* 106* 101*  CREATININE 1.56*   < > 1.62*   < > 2.80*   < > 2.17* 1.9* 1.6*  CALCIUM 8.6*   < > 9.4   < >  --    < > 9.3 9.0 8.7  MG 1.8  --   --   --   --   --   --   --   --    < > = values in this interval not displayed.   Recent Labs    07/17/19 2150 11/25/19 0000 12/09/19 0000 02/08/20 1827 02/13/20 0000  AST 17 14  --  34 51*  ALT 13 15  --  43 49*  ALKPHOS 141* 140* 149* 161* 222*  BILITOT 0.8  --   --  0.6  --   PROT 7.6  --   --  7.0  --   ALBUMIN 3.4* 2.9*  --  2.4* 2.9*   Recent Labs    07/19/19 0219 07/20/19 0723 08/02/19 0000 01/26/20 0000 01/30/20 0000 02/08/20 1827  WBC 16.1* 9.2   < > 9.2 8.0 9.4  NEUTROABS  --   --    < > 5.60 4.50 5.6  HGB 10.3* 10.3*   < > 11.1* 11.5* 13.2  HCT 32.2* 32.4*   < > 34* 35* 40.8  MCV 93.9 93.9  --   --   --  92.9  PLT 184 193   < > 190 197 340   < > = values in this interval not displayed.   Lab Results  Component Value Date   TSH  4.030 02/08/2020   Lab Results  Component Value Date   HGBA1C 6.3 (H) 01/04/2020   Lab Results  Component Value Date   CHOL 99 02/07/2019   HDL 26 (L) 02/07/2019   LDLCALC 50 02/07/2019   TRIG 117 02/07/2019   CHOLHDL 3.8 02/07/2019    Significant Diagnostic Results in last 30 days:  VAS [place] ABI WITH/WO TBI  Result Date: 02/28/2020 LOWER EXTREMITY DOPPLER STUDY Indications: Ulceration, and peripheral artery disease. High Risk Factors: Hypertension, hyperlipidemia, past history of smoking, prior                    CVA. CABG Other Factors: Marland Kitchen  Vascular Interventions: Angioplasty right anterior tibial artery 01/04/2020. Limitations: Today's exam was limited due to Exam performed in WC. Performing Technologist: Alvia Grove RVT  Examination Guidelines: A complete evaluation includes at minimum, Doppler waveform signals and systolic blood pressure reading at the level of bilateral brachial, anterior tibial, and posterior tibial arteries, when vessel segments are accessible. Bilateral testing is considered an integral part of a complete examination. Photoelectric Plethysmograph (PPG) waveforms and toe systolic pressure readings are included as required and additional duplex testing as needed. Limited examinations for reoccurring indications may be performed as noted.  ABI Findings: +--------+------------------+-----+----------+--------+  Right   Rt Pressure (mmHg)IndexWaveform  Comment  +--------+------------------+-----+----------+--------+ [id number]                                       +--------+------------------+-----+----------+--------+ PTA     238               2.33 biphasic           +--------+------------------+-----+----------+--------+ DP      249               2.44 monophasicbrisk    +--------+------------------+-----+----------+--------+ +--------+------------------+-----+----------+-------+ Left    Lt Pressure (mmHg)IndexWaveform  Comment  +--------+------------------+-----+----------+-------+ Brachial96                                       +--------+------------------+-----+----------+-------+ PTA     240               2.35 monophasic        +--------+------------------+-----+----------+-------+ DP      254               2.49 monophasicbrisk   +--------+------------------+-----+----------+-------+ +-------+-----------+------------+------------+------------+ ABI/TBIToday's ABIToday's TBI Previous ABIPrevious TBI +-------+-----------+------------+------------+------------+ Right  [place]         not obtainedNC                       +-------+-----------+------------+------------+------------+ Left   [place]         not obtainedNC                       +-------+-----------+------------+------------+------------+  Summary: Right: Resting right ankle-brachial index indicates noncompressible right lower extremity arteries. Left: Resting left ankle-brachial index indicates noncompressible left lower extremity arteries.  *See table(s) above for measurements and observations.  Electronically signed by Jamelle Haring on 02/28/2020 at 4:38:22 PM.   Final    ECHOCARDIOGRAM COMPLETE  Result Date: 03/14/2020    ECHOCARDIOGRAM REPORT   Patient Name:   [name] [facility] Date of Exam: 03/14/2020 Medical Rec #:  [id number]        Height:       65.0 in Accession #:    [id number]       Weight:       137.0 lb Date of Birth:  1945/10/04        BSA:          1.684 m Patient Age:    64 years         BP:           105/58 mmHg Patient Gender: F                HR:           62 bpm. Exam Location:  [place] Procedure: 2D Echo, Cardiac Doppler and Color Doppler Indications:    Z95.2 s/p TAVR  History:        Patient has prior history of Echocardiogram examinations, most                 recent 07/18/2019. HOCM, CAD, Pacemaker and Prior CABG, PAD,                 Stroke and CKD; Risk Factors:Dyslipidemia, Hypertension, HLD and                 Diabetes.  COVID 19 (2020).                 Aortic Valve: 23 mm Evolt Proplus valve is present in the aortic                 position. Procedure Date: 03/01/19.  Sonographer:    Marygrace Drought RCS Referring Phys: [id number] [place]  1. Small underfilled LV with mid/apical cavity obliteration in systole Mitral valve disease with MS likely contributes to low pre load and underfilling Appears to be a significant mid cavity gradient with velocity 2.9 m/sec at rest with HR 67 bpm This increases to velocity of 3.5 m/sec with valsalva . Left ventricular ejection fraction, by estimation, is 60 to 65%. The left ventricle has normal function. The left ventricle has no regional wall motion abnormalities. There is mild left ventricular hypertrophy. Left ventricular diastolic parameters are indeterminate.  2. Right ventricular systolic function is normal. The right ventricular size is normal. There is normal pulmonary artery systolic pressure.  3. Left atrial size was mildly dilated.  4. The mitral valve is degenerative. No evidence of mitral valve regurgitation. Moderate mitral stenosis. Severe mitral annular calcification.  5. Post TAVR with 23 mm Medtronic Evolut valve No PVL mean gradient 7 peak 12 mmHg . The aortic valve has been repaired/replaced. Aortic valve regurgitation is not visualized. No aortic stenosis is present. There is a 23 mm Evolt Proplus valve present in the aortic position. Procedure Date: 03/01/19.  6. The inferior vena cava is normal in size with greater than 50% respiratory variability, suggesting right atrial pressure of 3 mmHg. FINDINGS  Left Ventricle: Small underfilled LV with mid/apical cavity obliteration in systole Mitral valve disease with MS likely contributes to low pre load and underfilling Appears to be a significant mid cavity gradient with velocity 2.9 m/sec at rest with HR 67 bpm This increases to velocity of 3.5 m/sec with valsalva. Left ventricular ejection fraction, by  estimation, is 60 to 65%. The left ventricle has normal function. The left ventricle has no regional wall motion abnormalities. The left ventricular internal cavity size was normal in size. There is mild left ventricular hypertrophy. Left ventricular diastolic parameters are indeterminate. Right Ventricle: The right ventricular size is normal. No increase in right ventricular wall thickness. Right ventricular systolic function is normal. There is normal pulmonary artery systolic pressure. The tricuspid regurgitant velocity is 2.43 m/s, and  with an assumed right atrial pressure of 3 mmHg, the estimated right ventricular systolic pressure is 56.2 mmHg. Left Atrium: Left atrial size was mildly dilated. Right Atrium: Right atrial size was normal in size. Pericardium: There is no evidence of pericardial effusion. Mitral Valve: The mitral valve is degenerative in appearance. There is moderate thickening of the mitral valve leaflet(s). There is moderate calcification of the mitral valve leaflet(s). Severe mitral annular calcification. No evidence of mitral valve regurgitation. Moderate mitral valve stenosis. MV peak gradient, 8.8 mmHg. The mean mitral valve gradient is 3.0 mmHg. Tricuspid Valve: The tricuspid valve is normal in structure. Tricuspid valve regurgitation is not demonstrated. No evidence of tricuspid stenosis. Aortic Valve: Post TAVR with 23 mm Medtronic Evolut valve No PVL mean gradient 7 peak 12 mmHg. The aortic valve has been repaired/replaced. Aortic valve regurgitation is not visualized. No aortic stenosis is present. Aortic valve mean gradient measures 8.0 mmHg. Aortic valve peak gradient measures 14.7 mmHg. Aortic valve area, by VTI measures 1.64 cm. There is a 23 mm Evolt Proplus valve present  in the aortic position. Procedure Date: 03/01/19. Pulmonic Valve: The pulmonic valve was normal in structure. Pulmonic valve regurgitation is not visualized. No evidence of pulmonic stenosis. Aorta: The aortic  root is normal in size and structure. Venous: The inferior vena cava is normal in size with greater than 50% respiratory variability, suggesting right atrial pressure of 3 mmHg. IAS/Shunts: No atrial level shunt detected by color flow Doppler.  LEFT VENTRICLE PLAX 2D LVIDd:         2.40 cm  Diastology LVIDs:         1.70 cm  LV e' medial:    2.61 cm/s LV PW:         0.80 cm  LV E/e' medial:  28.7 LV IVS:        1.10 cm  LV e' lateral:   7.18 cm/s LVOT diam:     1.80 cm  LV E/e' lateral: 10.4 LV SV:         55 LV SV Index:   33 LVOT Area:     2.54 cm  RIGHT VENTRICLE RV Basal diam:  2.70 cm RV S prime:     6.53 cm/s TAPSE (M-mode): 1.0 cm RVSP:           26.6 mmHg LEFT ATRIUM             Index       RIGHT ATRIUM           Index LA diam:        4.10 cm 2.43 cm/m  RA Pressure: 3.00 mmHg LA Vol (A2C):   58.7 ml 34.85 ml/m RA Area:     10.40 cm LA Vol (A4C):   32.4 ml 19.24 ml/m RA Volume:   20.10 ml  11.93 ml/m LA Biplane Vol: 46.2 ml 27.43 ml/m  AORTIC VALVE AV Area (Vmax):    1.74 cm AV Area (Vmean):   1.93 cm AV Area (VTI):     1.64 cm AV Vmax:           192.00 cm/s AV Vmean:          126.000 cm/s AV VTI:            0.339 m AV Peak Grad:      14.7 mmHg AV Mean Grad:      8.0 mmHg LVOT Vmax:         131.00 cm/s LVOT Vmean:        95.400 cm/s LVOT VTI:          0.218 m LVOT/AV VTI ratio: 0.64  AORTA Ao Root diam: 2.60 cm MITRAL VALVE                TRICUSPID VALVE MV Area (PHT): 1.75 cm     TR Peak grad:   23.6 mmHg MV Area VTI:   1.08 cm     TR Vmax:        243.00 cm/s MV Peak grad:  8.8 mmHg     Estimated RAP:  3.00 mmHg MV Mean grad:  3.0 mmHg     RVSP:           26.6 mmHg MV Vmax:       1.48 m/s MV Vmean:      80.7 cm/s    SHUNTS MV Decel Time: 434 msec     Systemic VTI:  0.22 m MV E velocity: 75.00 cm/s   Systemic Diam: 1.80 cm MV A velocity: 133.00 cm/s MV E/A ratio:  0.56 Jenkins Rouge MD Electronically signed by Jenkins Rouge MD Signature Date/Time: 03/14/2020/4:53:10 PM    Final      Assessment/Plan  1. Primary hypertension -  BPs low, will discontinue Amlodipine, continue Metoprolol succinate and hydralazine -   Monitor BPs  2. Type 2 diabetes mellitus with diabetic neuropathy, with long-term current use of insulin (HCC) Lab Results  Component Value Date   HGBA1C 6.3 (H) 01/04/2020   -  Continue Xultophy and CBG checks  3. Osteomyelitis of great toe of right foot ([place]) -  Continue wound treatment for right great toe and right heel -   Continue doxycycline  4. Neuropathy -  Stable, continue gabapentin     Family/ staff Communication: Discussed plan of care with resident and charge nurse.  Labs/tests ordered: None  Goals of care:   Long-term care   Durenda Age, DNP, MSN, FNP-BC [facility] [phone] (Monday-Friday 8:00 a.m. - 5:00 p.m.) [phone] (after hours)

## 2020-03-19 ENCOUNTER — Encounter (HOSPITAL_BASED_OUTPATIENT_CLINIC_OR_DEPARTMENT_OTHER): Payer: Medicare Other | Attending: Internal Medicine | Admitting: Internal Medicine

## 2020-03-19 DIAGNOSIS — Z952 Presence of prosthetic heart valve: Secondary | ICD-10-CM | POA: Insufficient documentation

## 2020-03-19 DIAGNOSIS — E11621 Type 2 diabetes mellitus with foot ulcer: Secondary | ICD-10-CM | POA: Insufficient documentation

## 2020-03-19 DIAGNOSIS — I5032 Chronic diastolic (congestive) heart failure: Secondary | ICD-10-CM | POA: Insufficient documentation

## 2020-03-19 DIAGNOSIS — L97418 Non-pressure chronic ulcer of right heel and midfoot with other specified severity: Secondary | ICD-10-CM | POA: Insufficient documentation

## 2020-03-19 DIAGNOSIS — E1169 Type 2 diabetes mellitus with other specified complication: Secondary | ICD-10-CM | POA: Insufficient documentation

## 2020-03-19 DIAGNOSIS — L89313 Pressure ulcer of right buttock, stage 3: Secondary | ICD-10-CM | POA: Insufficient documentation

## 2020-03-19 DIAGNOSIS — I130 Hypertensive heart and chronic kidney disease with heart failure and stage 1 through stage 4 chronic kidney disease, or unspecified chronic kidney disease: Secondary | ICD-10-CM | POA: Insufficient documentation

## 2020-03-19 DIAGNOSIS — E1151 Type 2 diabetes mellitus with diabetic peripheral angiopathy without gangrene: Secondary | ICD-10-CM | POA: Insufficient documentation

## 2020-03-19 DIAGNOSIS — L89153 Pressure ulcer of sacral region, stage 3: Secondary | ICD-10-CM | POA: Insufficient documentation

## 2020-03-19 DIAGNOSIS — L97518 Non-pressure chronic ulcer of other part of right foot with other specified severity: Secondary | ICD-10-CM | POA: Insufficient documentation

## 2020-03-19 DIAGNOSIS — E1122 Type 2 diabetes mellitus with diabetic chronic kidney disease: Secondary | ICD-10-CM | POA: Insufficient documentation

## 2020-03-19 DIAGNOSIS — N1832 Chronic kidney disease, stage 3b: Secondary | ICD-10-CM | POA: Insufficient documentation

## 2020-03-19 DIAGNOSIS — M86671 Other chronic osteomyelitis, right ankle and foot: Secondary | ICD-10-CM | POA: Insufficient documentation

## 2020-03-19 DIAGNOSIS — Z950 Presence of cardiac pacemaker: Secondary | ICD-10-CM | POA: Insufficient documentation

## 2020-03-19 DIAGNOSIS — L89152 Pressure ulcer of sacral region, stage 2: Secondary | ICD-10-CM | POA: Diagnosis not present

## 2020-03-19 DIAGNOSIS — Z853 Personal history of malignant neoplasm of breast: Secondary | ICD-10-CM | POA: Insufficient documentation

## 2020-03-19 DIAGNOSIS — Z8673 Personal history of transient ischemic attack (TIA), and cerebral infarction without residual deficits: Secondary | ICD-10-CM | POA: Insufficient documentation

## 2020-03-19 DIAGNOSIS — L97412 Non-pressure chronic ulcer of right heel and midfoot with fat layer exposed: Secondary | ICD-10-CM | POA: Diagnosis not present

## 2020-03-19 DIAGNOSIS — L97512 Non-pressure chronic ulcer of other part of right foot with fat layer exposed: Secondary | ICD-10-CM | POA: Diagnosis not present

## 2020-03-20 ENCOUNTER — Other Ambulatory Visit: Payer: Self-pay

## 2020-03-20 ENCOUNTER — Encounter (HOSPITAL_BASED_OUTPATIENT_CLINIC_OR_DEPARTMENT_OTHER): Payer: Medicare Other | Admitting: Internal Medicine

## 2020-03-20 DIAGNOSIS — I5032 Chronic diastolic (congestive) heart failure: Secondary | ICD-10-CM | POA: Diagnosis not present

## 2020-03-20 DIAGNOSIS — Z853 Personal history of malignant neoplasm of breast: Secondary | ICD-10-CM | POA: Diagnosis not present

## 2020-03-20 DIAGNOSIS — L97412 Non-pressure chronic ulcer of right heel and midfoot with fat layer exposed: Secondary | ICD-10-CM | POA: Diagnosis not present

## 2020-03-20 DIAGNOSIS — Z7401 Bed confinement status: Secondary | ICD-10-CM | POA: Diagnosis not present

## 2020-03-20 DIAGNOSIS — Z950 Presence of cardiac pacemaker: Secondary | ICD-10-CM | POA: Diagnosis not present

## 2020-03-20 DIAGNOSIS — E1169 Type 2 diabetes mellitus with other specified complication: Secondary | ICD-10-CM | POA: Diagnosis not present

## 2020-03-20 DIAGNOSIS — L97418 Non-pressure chronic ulcer of right heel and midfoot with other specified severity: Secondary | ICD-10-CM | POA: Diagnosis not present

## 2020-03-20 DIAGNOSIS — L97518 Non-pressure chronic ulcer of other part of right foot with other specified severity: Secondary | ICD-10-CM | POA: Diagnosis not present

## 2020-03-20 DIAGNOSIS — M2550 Pain in unspecified joint: Secondary | ICD-10-CM | POA: Diagnosis not present

## 2020-03-20 DIAGNOSIS — G8190 Hemiplegia, unspecified affecting unspecified side: Secondary | ICD-10-CM | POA: Diagnosis not present

## 2020-03-20 DIAGNOSIS — E11621 Type 2 diabetes mellitus with foot ulcer: Secondary | ICD-10-CM | POA: Diagnosis not present

## 2020-03-20 DIAGNOSIS — Z952 Presence of prosthetic heart valve: Secondary | ICD-10-CM | POA: Diagnosis not present

## 2020-03-20 DIAGNOSIS — M86671 Other chronic osteomyelitis, right ankle and foot: Secondary | ICD-10-CM | POA: Diagnosis not present

## 2020-03-20 DIAGNOSIS — L89153 Pressure ulcer of sacral region, stage 3: Secondary | ICD-10-CM | POA: Diagnosis not present

## 2020-03-20 DIAGNOSIS — I130 Hypertensive heart and chronic kidney disease with heart failure and stage 1 through stage 4 chronic kidney disease, or unspecified chronic kidney disease: Secondary | ICD-10-CM | POA: Diagnosis not present

## 2020-03-20 DIAGNOSIS — E1122 Type 2 diabetes mellitus with diabetic chronic kidney disease: Secondary | ICD-10-CM | POA: Diagnosis not present

## 2020-03-20 DIAGNOSIS — I959 Hypotension, unspecified: Secondary | ICD-10-CM | POA: Diagnosis not present

## 2020-03-20 DIAGNOSIS — L89313 Pressure ulcer of right buttock, stage 3: Secondary | ICD-10-CM | POA: Diagnosis not present

## 2020-03-20 DIAGNOSIS — E1151 Type 2 diabetes mellitus with diabetic peripheral angiopathy without gangrene: Secondary | ICD-10-CM | POA: Diagnosis not present

## 2020-03-20 DIAGNOSIS — F29 Unspecified psychosis not due to a substance or known physiological condition: Secondary | ICD-10-CM | POA: Diagnosis not present

## 2020-03-20 DIAGNOSIS — Z8673 Personal history of transient ischemic attack (TIA), and cerebral infarction without residual deficits: Secondary | ICD-10-CM | POA: Diagnosis not present

## 2020-03-20 DIAGNOSIS — N1832 Chronic kidney disease, stage 3b: Secondary | ICD-10-CM | POA: Diagnosis not present

## 2020-03-20 NOTE — Progress Notes (Signed)
[name], [name] ([id number]) Visit Report for 03/20/2020 Abuse/Suicide Risk Screen Details Patient Name: Date of Service: [name], [name] 03/20/2020 1:15 PM Medical Record Number: [id number] Patient Account Number: [id number] Date of Birth/Sex: Treating RN: 1945-08-17 (75 y.o. Elam Dutch Primary Care Demyan Fugate: Hendricks Limes Other Clinician: Referring Nuala Chiles: Treating Colan Laymon/Extender: Mare Ferrari in Treatment: 0 Abuse/Suicide Risk Screen Items Answer ABUSE RISK SCREEN: Has anyone close to you tried to hurt or harm you recentlyo No Do you feel uncomfortable with anyone in your familyo No Has anyone forced you do things that you didnt want to doo No Electronic Signature(s) Signed: 03/20/2020 5:42:27 PM By: Baruch Gouty RN, BSN Entered By: Baruch Gouty on 03/20/2020 14:39:52 -------------------------------------------------------------------------------- Activities of Daily Living Details Patient Name: Date of Service: [name], [name] 03/20/2020 1:15 PM Medical Record Number: [id number] Patient Account Number: [id number] Date of Birth/Sex: Treating RN: 15-Mar-1945 (75 y.o. Elam Dutch Primary Care Trevaughn Schear: Hendricks Limes Other Clinician: Referring Amias Hutchinson: Treating Shelly Spenser/Extender: Mare Ferrari in Treatment: 0 Activities of Daily Living Items Answer Activities of Daily Living (Please select one for each item) Drive Automobile Not Able T Medications ake Need Assistance Use T elephone Completely Able Care for Appearance Need Assistance Use T oilet Need Assistance Bath / Shower Need Assistance Dress Self Need Assistance Feed Self Completely Able Walk Not Able Get In / Out Bed Need Assistance Housework Not Able Prepare Meals Not Able Handle Money Need Assistance Shop for Self Not Able Electronic Signature(s) Signed: 03/20/2020 5:42:27 PM By: Baruch Gouty RN, BSN Entered By:  Baruch Gouty on 03/20/2020 14:40:44 -------------------------------------------------------------------------------- Education Screening Details Patient Name: Date of Service: [name], [name] 03/20/2020 1:15 PM Medical Record Number: [id number] Patient Account Number: [id number] Date of Birth/Sex: Treating RN: 1945-04-30 (75 y.o. Elam Dutch Primary Care Shandy Vi: Hendricks Limes Other Clinician: Referring Delle Andrzejewski: Treating Rishith Siddoway/Extender: Mare Ferrari in Treatment: 0 Primary Learner Assessed: Patient Learning Preferences/Education Level/Primary Language Learning Preference: Explanation, Demonstration, Printed Material Highest Education Level: High School Preferred Language: English Cognitive Barrier Language Barrier: No Translator Needed: No Memory Deficit: No Emotional Barrier: No Cultural/Religious Beliefs Affecting Medical Care: Yes Jehovah witness, no blood products Physical Barrier Impaired Vision: No Impaired Hearing: No Decreased Hand dexterity: No Knowledge/Comprehension Knowledge Level: Medium Comprehension Level: Medium Ability to understand written instructions: High Ability to understand verbal instructions: High Motivation Anxiety Level: Calm Cooperation: Cooperative Education Importance: Acknowledges Need Interest in Health Problems: Asks Questions Perception: Coherent Willingness to Engage in Self-Management High Activities: Readiness to Engage in Self-Management High Activities: Electronic Signature(s) Signed: 03/20/2020 5:42:27 PM By: Baruch Gouty RN, BSN Entered By: Baruch Gouty on 03/20/2020 14:41:45 -------------------------------------------------------------------------------- Fall Risk Assessment Details Patient Name: Date of Service: [name] 03/20/2020 1:15 PM Medical Record Number: [id number] Patient Account Number: [id number] Date of Birth/Sex: Treating RN: August 20, 1945 (75 y.o.  Elam Dutch Primary Care Jaylia Pettus: Hendricks Limes Other Clinician: Referring Daleyza Gadomski: Treating Nautia Lem/Extender: Mare Ferrari in Treatment: 0 Fall Risk Assessment Items Have you had 2 or more falls in the last 12 monthso 0 No Have you had any fall that resulted in injury in the last 12 monthso 0 No FALLS RISK SCREEN History of falling - immediate or within 3 months 0 No Secondary diagnosis (Do you have 2 or more medical diagnoseso) 0 No Ambulatory aid None/bed rest/wheelchair/nurse 0 Yes Crutches/cane/walker 0 No Furniture 0 No Intravenous therapy Access/Saline/Heparin [place] Clap  0 No Gait/Transferring Normal/ bed rest/ wheelchair 0 Yes Weak (short steps with or without shuffle, stooped but able to lift head while walking, may seek 0 No support from furniture) Impaired (short steps with shuffle, may have difficulty arising from chair, head down, impaired 0 No balance) Mental Status Oriented to own ability 0 Yes Electronic Signature(s) Signed: 03/20/2020 5:42:27 PM By: Baruch Gouty RN, BSN Entered By: Baruch Gouty on 03/20/2020 14:42:17 -------------------------------------------------------------------------------- Foot Assessment Details Patient Name: Date of Service: [name], [name] 03/20/2020 1:15 PM Medical Record Number: [id number] Patient Account Number: [id number] Date of Birth/Sex: Treating RN: [birth year] (75 y.o. Elam Dutch Primary Care Kallista Pae: Hendricks Limes Other Clinician: Referring Krissi Willaims: Treating Kemal Amores/Extender: Mare Ferrari in Treatment: 0 Foot Assessment Items Site Locations + = Sensation present, - = Sensation absent, C = Callus, U = Ulcer R = Redness, W = Warmth, M = Maceration, PU = Pre-ulcerative lesion F = Fissure, S = Swelling, D = Dryness Assessment Right: Left: Other Deformity: No No Prior Foot Ulcer: No No Prior Amputation: No No Charcot Joint: No  No Ambulatory Status: Non-ambulatory Assistance Device: Stretcher Gait: Engineer, maintenance) Signed: 03/20/2020 5:42:27 PM By: Baruch Gouty RN, BSN Entered By: Baruch Gouty on 03/20/2020 14:44:21 -------------------------------------------------------------------------------- Nutrition Risk Screening Details Patient Name: Date of Service: [name], [name] 03/20/2020 1:15 PM Medical Record Number: [id number] Patient Account Number: [id number] Date of Birth/Sex: Treating RN: 05-23-1945 (75 y.o. Elam Dutch Primary Care Sheily Lineman: Hendricks Limes Other Clinician: Referring Tesla Bochicchio: Treating Max Nuno/Extender: Malachy Moan Weeks in Treatment: 0 Height (in): 66 Weight (lbs): 165 Body Mass Index (BMI): 26.6 Nutrition Risk Screening Items Score Screening NUTRITION RISK SCREEN: I have an illness or condition that made me change the kind and/or amount of food I eat 2 Yes I eat fewer than two meals per day 3 Yes I eat few fruits and vegetables, or milk products 0 No I have three or more drinks of beer, liquor or wine almost every day 0 No I have tooth or mouth problems that make it hard for me to eat 0 No I don't always have enough money to buy the food I need 0 No I eat alone most of the time 1 Yes I take three or more different prescribed or over-the-counter drugs a day 1 Yes Without wanting to, I have lost or gained 10 pounds in the last six months 2 Yes I am not always physically able to shop, cook and/or feed myself 0 No Nutrition Protocols Good Risk Protocol Moderate Risk Protocol Provide education on elevated blood High Risk Proctocol 0 sugars and impact on wound healing, as applicable Risk Level: High Risk Score: 9 Electronic Signature(s) Signed: 03/20/2020 5:42:27 PM By: Baruch Gouty RN, BSN Entered By: Baruch Gouty on 03/20/2020 14:42:48

## 2020-03-21 ENCOUNTER — Other Ambulatory Visit (HOSPITAL_COMMUNITY)
Admit: 2020-03-21 | Discharge: 2020-03-21 | Disposition: A | Discharge status: Home / Self Care | Payer: Medicare Other | Source: Ambulatory Visit | Attending: Internal Medicine | Admitting: Internal Medicine

## 2020-03-21 DIAGNOSIS — Z87440 Personal history of urinary (tract) infections: Secondary | ICD-10-CM | POA: Diagnosis not present

## 2020-03-21 DIAGNOSIS — R630 Anorexia: Secondary | ICD-10-CM | POA: Diagnosis not present

## 2020-03-21 DIAGNOSIS — E11621 Type 2 diabetes mellitus with foot ulcer: Secondary | ICD-10-CM | POA: Insufficient documentation

## 2020-03-21 NOTE — Progress Notes (Addendum)
KANYA, POTTEIGER ([id number]) Visit Report for 03/20/2020 Chief Complaint Document Details Patient Name: Date of Service: [name], [name] 03/20/2020 1:15 PM Medical Record Number: [id number] Patient Account Number: [id number] Date of Birth/Sex: Treating RN: 10-23-1945 (75 y.o. Female) Rhae Hammock Primary Care Provider: Hendricks Limes Other Clinician: Referring Provider: Treating Provider/Extender: Mare Ferrari in Treatment: 0 Information Obtained from: Patient Chief Complaint 03/20/2020; patient is here for review of several wounds sacrum, right buttock, right heel and the tip of the right great toe Electronic Signature(s) Signed: 03/20/2020 5:14:25 PM By: Linton Ham MD Entered By: Linton Ham on 03/20/2020 15:41:48 -------------------------------------------------------------------------------- Debridement Details Patient Name: Date of Service: [name] 03/20/2020 1:15 PM Medical Record Number: [id number] Patient Account Number: [id number] Date of Birth/Sex: Treating RN: [birth year] (75 y.o. Female) Rhae Hammock Primary Care Provider: Hendricks Limes Other Clinician: Referring Provider: Treating Provider/Extender: Mare Ferrari in Treatment: 0 Debridement Performed for Assessment: Wound #4 Right Gluteus Performed By: Clinician Deon Pilling, RN Debridement Type: Chemical/Enzymatic/Mechanical Agent Used: Santyl Level of Consciousness (Pre-procedure): Awake and Alert Pre-procedure Verification/Time Out Yes - 15:37 Taken: Start Time: 15:37 Pain Control: Lidocaine Bleeding: Minimum Hemostasis Achieved: Pressure End Time: 15:38 Procedural Pain: 0 Post Procedural Pain: 0 Response to Treatment: Procedure was tolerated well Level of Consciousness (Post- Awake and Alert procedure): Post Debridement Measurements of Total Wound Length: (cm) 4.4 Stage: Category/Stage III Width: (cm) 0.5 Depth:  (cm) 0.1 Volume: (cm) 0.173 Character of Wound/Ulcer Post Debridement: Improved Post Procedure Diagnosis Same as Pre-procedure Electronic Signature(s) Signed: 03/20/2020 5:14:25 PM By: Linton Ham MD Signed: 03/21/2020 5:56:51 PM By: Rhae Hammock RN Entered By: Rhae Hammock on 03/20/2020 15:30:33 -------------------------------------------------------------------------------- Debridement Details Patient Name: Date of Service: [name] 03/20/2020 1:15 PM Medical Record Number: [id number] Patient Account Number: [id number] Date of Birth/Sex: Treating RN: 1945/11/12 (75 y.o. Female) Rhae Hammock Primary Care Provider: Hendricks Limes Other Clinician: Referring Provider: Treating Provider/Extender: Mare Ferrari in Treatment: 0 Debridement Performed for Assessment: Wound #3 Sacrum Performed By: Clinician Deon Pilling, RN Debridement Type: Chemical/Enzymatic/Mechanical Agent Used: Santyl Level of Consciousness (Pre-procedure): Awake and Alert Pre-procedure Verification/Time Out Yes - 15:35 Taken: Start Time: 15:35 Pain Control: Lidocaine Bleeding: Minimum Hemostasis Achieved: Pressure End Time: 15:36 Procedural Pain: 0 Post Procedural Pain: 0 Response to Treatment: Procedure was tolerated well Level of Consciousness (Post- Awake and Alert procedure): Post Debridement Measurements of Total Wound Length: (cm) 4.9 Stage: Category/Stage III Width: (cm) 6.9 Depth: (cm) 0.1 Volume: (cm) 2.655 Character of Wound/Ulcer Post Debridement: Improved Post Procedure Diagnosis Same as Pre-procedure Electronic Signature(s) Signed: 03/20/2020 5:14:25 PM By: Linton Ham MD Signed: 03/21/2020 5:56:51 PM By: Rhae Hammock RN Entered By: Rhae Hammock on 03/20/2020 15:31:08 -------------------------------------------------------------------------------- Debridement Details Patient Name: Date of Service: [name] 03/20/2020 1:15 PM Medical Record Number: [id number] Patient Account Number: [id number] Date of Birth/Sex: Treating RN: 10/18/1945 (75 y.o. Female) Rhae Hammock Primary Care Provider: Hendricks Limes Other Clinician: Referring Provider: Treating Provider/Extender: Mare Ferrari in Treatment: 0 Debridement Performed for Assessment: Wound #2 Right Calcaneus Performed By: Physician Ricard Dillon., MD Debridement Type: Debridement Severity of Tissue Pre Debridement: Fat layer exposed Level of Consciousness (Pre-procedure): Awake and Alert Pre-procedure Verification/Time Out Yes - 15:13 Taken: Start Time: 15:13 Pain Control: Lidocaine T Area Debrided (L x W): otal 3 (cm) x 6 (cm) = 18 (cm) Tissue and other material debrided: Viable, Non-Viable, Eschar,  Slough, Subcutaneous, Skin: Dermis , Slough Level: Skin/Subcutaneous Tissue Debridement Description: Excisional Instrument: Curette, Forceps Specimen: Tissue Culture Number of Specimens T aken: 1 Bleeding: Minimum Hemostasis Achieved: Pressure End Time: 15:17 Procedural Pain: 0 Post Procedural Pain: 0 Response to Treatment: Procedure was tolerated well Level of Consciousness (Post- Awake and Alert procedure): Post Debridement Measurements of Total Wound Length: (cm) 3 Width: (cm) 6 Depth: (cm) 2 Volume: (cm) 28.274 Character of Wound/Ulcer Post Debridement: Improved Severity of Tissue Post Debridement: Fat layer exposed Post Procedure Diagnosis Same as Pre-procedure Electronic Signature(s) Signed: 03/20/2020 5:14:25 PM By: Linton Ham MD Signed: 03/21/2020 5:56:51 PM By: Rhae Hammock RN Entered By: Linton Ham on 03/20/2020 15:41:15 -------------------------------------------------------------------------------- HPI Details Patient Name: Date of Service: [name] 03/20/2020 1:15 PM Medical Record Number: [id number] Patient Account Number: [id number] Date of  Birth/Sex: Treating RN: November 28, 1945 (75 y.o. Female) Rhae Hammock Primary Care Provider: Hendricks Limes Other Clinician: Referring Provider: Treating Provider/Extender: Mare Ferrari in Treatment: 0 History of Present Illness HPI Description: Admission 03/20/2020 This is a frail 75 year old woman who is a chronic resident of [facility]. She is here for review of multiple areas including the sacrum, right buttock, right heel and tip of the right great toe. Tracing this back this seems to have started in September. She started being followed by Dr. Cannon Kettle of podiatry at that point having an area on the right great toe as well as documented osteomyelitis of the right first toe. On November 4 she saw Dr. Stanford Breed of vein and vascular. Noninvasive studies at that point showed noncompressible ABIs monophasic waveforms and a TBI in the left of 0.7. Right could not be done because of bandaging. She underwent an ANGIOGRAM on 01/04/2020. This showed a 75% focal stenosis of the common femoral artery. No hemodynamically significant stenosis in the SFA or popliteal artery. All tibial arteries were occluded at their origin but there was reconstitution of the anterior tibial and peroneal artery at the ankle. She received an angioplasty of the anterior tibial artery with inline flow to the foot. A plain x-ray on 02/08/2020 showed progressive bony destructions involving the distal phalanx of the right great toe. At her last visit with Dr. Cannon Kettle on 03/01/2020 she had dry eschar on the right heel as well as a wound on the left heel. The right first toe had dry eschar with hyperkeratotic tissue around it. She has a follow-up with vascular study on 08/28/2020 but Dr. Luan Pulling notes suggest she was not a candidate for any further intervention. She comes in today with an area on the right first toe. This probes to bone I think they have been using Betadine and silver alginate.  Necrotic eschar with significant malodor on the right heel. There is nothing open on the left heel. She has a stage III wound on the sacrum and a an unstageable area on the right gluteal fold just above which almost looks like a linear brief pressure area. 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 She lives at [place] Signature(s) Signed: 03/20/2020 5:14:25 PM By: Linton Ham MD Entered By: Linton Ham on 03/20/2020 15:49:40 -------------------------------------------------------------------------------- Physical Exam Details Patient Name: Date of Service: [name], [name] 03/20/2020 1:15 PM Medical Record Number: [id number] Patient Account Number: [id number] Date of Birth/Sex: Treating RN: 1945/10/23 (74 y.o.  Female) Rhae Hammock Primary Care Provider: Hendricks Limes Other Clinician: Referring Provider: Treating Provider/Extender: Malachy Moan Weeks in Treatment: 0 Constitutional Sitting or standing Blood Pressure is within target range for patient.. Pulse regular and within target range for patient.Marland Kitchen Respirations regular, non-labored and within target range.. Temperature is normal and within the target range for the patient.Marland Kitchen Appears in no distress. Respiratory work of breathing is normal. Bilateral breath sounds are clear and equal in all lobes with no wheezes, rales or rhonchi.. Cardiovascular 3 out of 6 tight systolic ejection murmur heard maximally at the lower left sternal border. I did not hear a mitral stenosis murmur. Dorsalis pedis pulses faintly palpable on the right not the posterior tibial. Popliteal and femoral pulses are both palpable. Notes Wound exam Right first toe tip. This actually does not look too bad. It does probe  close to bone however the bone feels solid and the surrounding granulation looks healthy Right heel ulcer is necrotic and malodorous separating eschar. I remove this with a #15 scalpel and pickups completely necrotic surface underneath this. I did a subcutaneous culture of this but I did not alter antibiotics. Sacral wound stage III surrounding buttocks. There is still eschar over a lot of this wound will need to be removed echnically unstageable wound on the right buttock probably a stage II. This will also need debridement. T There is nothing open on the left heel Electronic Signature(s) Signed: 03/20/2020 5:14:25 PM By: Linton Ham MD Entered By: Linton Ham on 03/20/2020 16:04:27 -------------------------------------------------------------------------------- Physician Orders Details Patient Name: Date of Service: [name], [name] 03/20/2020 1:15 PM Medical Record Number: [id number] Patient Account Number: [id number] Date of Birth/Sex: Treating RN: 1945-12-01 (75 y.o. Female) Rhae Hammock Primary Care Provider: Hendricks Limes Other Clinician: Referring Provider: Treating Provider/Extender: Mare Ferrari in Treatment: 0 Verbal / Phone Orders: No Diagnosis Coding Follow-up Appointments Return Appointment in 2 weeks. Bathing/ Shower/ Hygiene May shower and wash wound with soap and water. - may wash on days the wound dressing is to be changed. Dial antibacterial soap is a good soap to use while washing the feet. Feet should not be soaked in anything, only washed and then thoroughly rinsed. Edema Control - Lymphedema / SCD / Other Elevate legs to the level of the heart or above for 30 minutes daily and/or when sitting, a frequency of: Avoid standing for long periods of time. Off-Loading Turn and reposition every 2 hours Other: - Keep pressure off of heels using the bunny boot or multipodus boots. Wound Treatment Wound #1 - T Great oe Wound  Laterality: Right Cleanser: Soap and Water Every Other Day/15 Days Discharge Instructions: May shower and wash wound with dial antibacterial soap and water prior to dressing change. Cleanser: Wound Cleanser Every Other Day/15 Days Discharge Instructions: Cleanse the wound with wound cleanser prior to applying a clean dressing using gauze sponges, not tissue or cotton balls. Prim Dressing: KerraCel Ag Gelling Fiber Dressing, 4x5 in (silver alginate) Every Other Day/15 Days ary Discharge Instructions: Apply silver alginate to wound bed as instructed Secondary Dressing: Woven Gauze Sponges 2x2 in Every Other Day/15 Days Discharge Instructions: Apply over primary dressing as directed. Secured With: Child psychotherapist, Sterile 2x75 (in/in) Every Other Day/15 Days Discharge Instructions: Secure with stretch gauze as directed. Secured With: Paper Tape, 1x10 (in/yd) Every Other Day/15 Days Discharge Instructions: Secure dressing with tape as directed. Wound #2 - Calcaneus Wound Laterality: Right Cleanser: Soap and Water  Every Other Day/15 Days Discharge Instructions: May shower and wash wound with dial antibacterial soap and water prior to dressing change. Cleanser: Wound Cleanser Every Other Day/15 Days Discharge Instructions: Cleanse the wound with wound cleanser prior to applying a clean dressing using gauze sponges, not tissue or cotton balls. Peri-Wound Care: Zinc Oxide Ointment 30g tube Every Other Day/15 Days Discharge Instructions: Apply Zinc Oxide to periwound with each dressing change Prim Dressing: KerraCel Ag Gelling Fiber Dressing, 4x5 in (silver alginate) Every Other Day/15 Days ary Discharge Instructions: Apply silver alginate to wound bed as instructed Secondary Dressing: Woven Gauze Sponge, Non-Sterile 4x4 in Every Other Day/15 Days Discharge Instructions: Apply over primary dressing as directed. Secondary Dressing: ABD Pad, 5x9 Every Other Day/15 Days Discharge  Instructions: Apply over primary dressing as directed. Secondary Dressing: ALLEVYN Heel 4 1/2in x 5 1/2in / 10.5cm x 13.5cm Every Other Day/15 Days Discharge Instructions: Apply over primary dressing as directed. Secured With: [employer], 4.5x3.1 (in/yd) Every Other Day/15 Days Discharge Instructions: Secure with Kerlix as directed. Secured With: Paper Tape, 1x10 (in/yd) Every Other Day/15 Days Discharge Instructions: Secure dressing with tape as directed. Secured With: netting Every Other Day/15 Days Discharge Instructions: may use netting over the kerlix Wound #3 - Sacrum Cleanser: Soap and Water 1 x Per Day/15 Days Discharge Instructions: May shower and wash wound with dial antibacterial soap and water prior to dressing change. Cleanser: Wound Cleanser 1 x Per Day/15 Days Discharge Instructions: Cleanse the wound with wound cleanser prior to applying a clean dressing using gauze sponges, not tissue or cotton balls. Peri-Wound Care: Skin Prep 1 x Per Day/15 Days Discharge Instructions: Use skin prep as directed Prim Dressing: Santyl Ointment 1 x Per Day/15 Days ary Discharge Instructions: Apply nickel thick amount to wound bed as instructed Secondary Dressing: Woven Gauze Sponge, Non-Sterile 4x4 in 1 x Per Day/15 Days Discharge Instructions: Apply over primary dressing as directed. Secondary Dressing: ComfortFoam Border, 6x6 in (silicone border) 1 x Per Day/15 Days Discharge Instructions: Apply over primary dressing as directed. Wound #4 - Gluteus Wound Laterality: Right Cleanser: Soap and Water 1 x Per ESP/23 Days Discharge Instructions: May shower and wash wound with dial antibacterial soap and water prior to dressing change. Cleanser: Wound Cleanser 1 x Per Day/15 Days Discharge Instructions: Cleanse the wound with wound cleanser prior to applying a clean dressing using gauze sponges, not tissue or cotton balls. Peri-Wound Care: Skin Prep 1 x Per Day/15 Days Discharge  Instructions: Use skin prep as directed Prim Dressing: Santyl Ointment 1 x Per Day/15 Days ary Discharge Instructions: Apply nickel thick amount to wound bed as instructed Secondary Dressing: Woven Gauze Sponge, Non-Sterile 4x4 in 1 x Per Day/15 Days Discharge Instructions: Apply over primary dressing as directed. Secondary Dressing: ComfortFoam Border, 6x6 in (silicone border) 1 x Per Day/15 Days Discharge Instructions: Apply over primary dressing as directed. Laboratory naerobe culture (MICRO) - right heel Bacteria identified in Unspecified specimen by A LOINC Code: 300-7 Convenience Name: Anerobic culture Electronic Signature(s) Signed: 03/20/2020 5:14:25 PM By: Linton Ham MD Signed: 03/21/2020 5:56:51 PM By: Rhae Hammock RN Entered By: Rhae Hammock on 03/20/2020 15:40:50 -------------------------------------------------------------------------------- Problem List Details Patient Name: Date of Service: [name], [name] 03/20/2020 1:15 PM Medical Record Number: [id number] Patient Account Number: [id number] Date of Birth/Sex: Treating RN: Aug 30, 1945 (75 y.o. Female) Rhae Hammock Primary Care Provider: Hendricks Limes Other Clinician: Referring Provider: Treating Provider/Extender: Mare Ferrari in Treatment: 0 Active Problems ICD-10 Encounter  Code Description Active Date MDM Diagnosis E11.621 Type 2 diabetes mellitus with foot ulcer 03/20/2020 No Yes E11.51 Type 2 diabetes mellitus with diabetic peripheral angiopathy without gangrene 03/20/2020 No Yes M86.671 Other chronic osteomyelitis, right ankle and foot 03/20/2020 No Yes L97.518 Non-pressure chronic ulcer of other part of right foot with other specified 03/20/2020 No Yes severity L97.418 Non-pressure chronic ulcer of right heel and midfoot with other specified 03/20/2020 No Yes severity L89.153 Pressure ulcer of sacral region, stage 3 03/20/2020 No Yes L89.313 Pressure ulcer  of right buttock, stage 3 03/20/2020 No Yes Inactive Problems Resolved Problems Electronic Signature(s) Signed: 03/20/2020 5:14:25 PM By: Linton Ham MD Entered By: Linton Ham on 03/20/2020 15:40:35 -------------------------------------------------------------------------------- Progress Note Details Patient Name: Date of Service: [name] 03/20/2020 1:15 PM Medical Record Number: [id number] Patient Account Number: [id number] Date of Birth/Sex: Treating RN: [birth year] (75 y.o. Female) Rhae Hammock Primary Care Provider: Hendricks Limes Other Clinician: Referring Provider: Treating Provider/Extender: Mare Ferrari in Treatment: 0 Subjective Chief Complaint Information obtained from Patient 03/20/2020; patient is here for review of several wounds sacrum, right buttock, right heel and the tip of the right great toe History of Present Illness (HPI) Admission 03/20/2020 This is a frail 75 year old woman who is a chronic resident of [facility]. She is here for review of multiple areas including the sacrum, right buttock, right heel and tip of the right great toe. Tracing this back this seems to have started in September. She started being followed by Dr. Cannon Kettle of podiatry at that point having an area on the right great toe as well as documented osteomyelitis of the right first toe. On November 4 she saw Dr. Stanford Breed of vein and vascular. Noninvasive studies at that point showed noncompressible ABIs monophasic waveforms and a TBI in the left of 0.7. Right could not be done because of bandaging. She underwent an ANGIOGRAM on 01/04/2020. This showed a 75% focal stenosis of the common femoral artery. No hemodynamically significant stenosis in the SFA or popliteal artery. All tibial arteries were occluded at their origin but there was reconstitution of the anterior tibial and peroneal artery at the ankle. She received an angioplasty of  the anterior tibial artery with inline flow to the foot. A plain x-ray on 02/08/2020 showed progressive bony destructions involving the distal phalanx of the right great toe. At her last visit with Dr. Cannon Kettle on 03/01/2020 she had dry eschar on the right heel as well as a wound on the left heel. The right first toe had dry eschar with hyperkeratotic tissue around it. She has a follow-up with vascular study on 08/28/2020 but Dr. Luan Pulling notes suggest she was not a candidate for any further intervention. She comes in today with an area on the right first toe. This probes to bone I think they have been using Betadine and silver alginate. Necrotic eschar with significant malodor on the right heel. There is nothing open on the left heel. She has a stage III wound on the sacrum and a an unstageable area on the right gluteal fold just above which almost looks like a linear brief pressure area. 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 She lives at [facility] skilled facility Patient History Allergies No Known Allergies Family History Diabetes - Mother, Hypertension - Father, No family history of Cancer,  Heart Disease, Hereditary Spherocytosis, Kidney Disease, Lung Disease, Seizures, Stroke, Thyroid Problems, Tuberculosis. Social History Never smoker, Marital Status - Separated, Alcohol Use - Never, Drug Use - No History, Caffeine Use - Daily. Medical History Eyes Denies history of Cataracts, Glaucoma, Optic Neuritis Cardiovascular Patient has history of Congestive Heart Failure, Coronary Artery Disease, Hypertension, Peripheral Arterial Disease Endocrine Patient has history of Type II Diabetes Genitourinary Denies history of End Stage Renal Disease Integumentary (Skin) Denies history of History of  Burn Musculoskeletal Patient has history of Osteomyelitis Oncologic Denies history of Received Chemotherapy, Received Radiation Psychiatric Patient has history of Anorexia/bulimia - poor appetite Denies history of Confinement Anxiety Patient is treated with Insulin. Blood sugar is tested. Hospitalization/Surgery History - lower extremity angiography. - peripheral balloon angioplasty. - TEE. - transcatheter aortic valve replacement. - cardiac cath. - pacemaker placed. - left mastectomy. - abdominal hysterectomy. - cholecystectomy. Medical A Surgical History Notes nd Constitutional Symptoms (General Health) vitamin B12 deficiency Cardiovascular hypertrophic ocstructive cardiomyopathy, hyperlipidemia, complete heart bloc, pacemaker, aortic stenosis Gastrointestinal constipation Genitourinary CKD stage 3 Neurologic CVA Oncologic adenocarcinoma of breast Psychiatric depression Review of Systems (ROS) Constitutional Symptoms (General Health) Complains or has symptoms of Fatigue. Eyes Complains or has symptoms of Glasses / Contacts - reading. Ear/Nose/Mouth/Throat Denies complaints or symptoms of Chronic sinus problems or rhinitis. Respiratory Denies complaints or symptoms of Chronic or frequent coughs, Shortness of Breath. Gastrointestinal Denies complaints or symptoms of Frequent diarrhea, Nausea, Vomiting. Genitourinary Denies complaints or symptoms of Frequent urination. Integumentary (Skin) Complains or has symptoms of Wounds - sacrum, right glut, right heel, right great toe. Musculoskeletal Complains or has symptoms of Muscle Weakness. Neurologic Denies complaints or symptoms of Numbness/parasthesias. Psychiatric Denies complaints or symptoms of Claustrophobia, Suicidal. Objective Constitutional Sitting or standing Blood Pressure is within target range for patient.. Pulse regular and within target range for patient.Marland Kitchen Respirations regular, non-labored and within  target range.. Temperature is normal and within the target range for the patient.Marland Kitchen Appears in no distress. Vitals Time Taken: 2:25 PM, Height: 66 in, Source: Stated, Weight: 165 lbs, Source: Stated, BMI: 26.6, Temperature: 98.2 F, Pulse: 80 bpm, Respiratory Rate: 18 breaths/min, Blood Pressure: 117/69 mmHg. General Notes: glucose monitored at facility Respiratory work of breathing is normal. Bilateral breath sounds are clear and equal in all lobes with no wheezes, rales or rhonchi.. Cardiovascular 3 out of 6 tight systolic ejection murmur heard maximally at the lower left sternal border. I did not hear a mitral stenosis murmur. Dorsalis pedis pulses faintly palpable on the right not the posterior tibial. Popliteal and femoral pulses are both palpable. General Notes: Wound exam ooRight first toe tip. This actually does not look too bad. It does probe close to bone however the bone feels solid and the surrounding granulation looks healthy ooRight heel ulcer is necrotic and malodorous separating eschar. I remove this with a #15 scalpel and pickups completely necrotic surface underneath this. I did a subcutaneous culture of this but I did not alter antibiotics. ooSacral wound stage III surrounding buttocks. There is still eschar over a lot of this wound will need to be removed ooT echnically unstageable wound on the right buttock probably a stage II. This will also need debridement. ooThere is nothing open on the left heel Integumentary (Hair, Skin) Wound #1 status is Open. Original cause of wound was Gradually Appeared. The wound is located on the Right T Great. The wound measures 0.4cm length x oe 1.2cm width x 0.6cm depth; 0.377cm^2 area and 0.226cm^3 volume. There  is bone and Fat Layer (Subcutaneous Tissue) exposed. There is no tunneling or undermining noted. There is a medium amount of sanguinous drainage noted. The wound margin is distinct with the outline attached to the wound base. There  is large (67-100%) red, friable granulation within the wound bed. There is no necrotic tissue within the wound bed. Wound #2 status is Open. Original cause of wound was Pressure Injury. The wound is located on the Right Calcaneus. The wound measures 3cm length x 6cm width x 1.1cm depth; 14.137cm^2 area and 15.551cm^3 volume. There is Fat Layer (Subcutaneous Tissue) exposed. There is no tunneling or undermining noted. There is a medium amount of serosanguineous drainage noted. Foul odor after cleansing was noted. The wound margin is flat and intact. There is no granulation within the wound bed. There is a large (67-100%) amount of necrotic tissue within the wound bed including Eschar and Adherent Slough. Wound #3 status is Open. Original cause of wound was Pressure Injury. The wound is located on the Sacrum. The wound measures 4.9cm length x 6.9cm width x 0.1cm depth; 26.554cm^2 area and 2.655cm^3 volume. There is Fat Layer (Subcutaneous Tissue) exposed. There is no tunneling or undermining noted. There is a medium amount of serosanguineous drainage noted. The wound margin is flat and intact. There is small (1-33%) red, pink granulation within the wound bed. There is a large (67-100%) amount of necrotic tissue within the wound bed including Eschar and Adherent Slough. Wound #4 status is Open. Original cause of wound was Pressure Injury. The wound is located on the Right Gluteus. The wound measures 4.4cm length x 0.5cm width x 0.1cm depth; 1.728cm^2 area and 0.173cm^3 volume. There is Fat Layer (Subcutaneous Tissue) exposed. There is no tunneling or undermining noted. There is a small amount of serosanguineous drainage noted. The wound margin is flat and intact. There is medium (34-66%) pink granulation within the wound bed. There is a medium (34-66%) amount of necrotic tissue within the wound bed including Adherent Slough. Assessment Active Problems ICD-10 Type 2 diabetes mellitus with foot  ulcer Type 2 diabetes mellitus with diabetic peripheral angiopathy without gangrene Other chronic osteomyelitis, right ankle and foot Non-pressure chronic ulcer of other part of right foot with other specified severity Non-pressure chronic ulcer of right heel and midfoot with other specified severity Pressure ulcer of sacral region, stage 3 Pressure ulcer of right buttock, stage 3 Procedures Wound #2 Pre-procedure diagnosis of Wound #2 is a Diabetic Wound/Ulcer of the Lower Extremity located on the Right Calcaneus .Severity of Tissue Pre Debridement is: Fat layer exposed. There was a Excisional Skin/Subcutaneous Tissue Debridement with a total area of 18 sq cm performed by Ricard Dillon., MD. With the following instrument(s): Curette, and Forceps to remove Viable and Non-Viable tissue/material. Material removed includes Eschar, Subcutaneous Tissue, Slough, and Skin: Dermis after achieving pain control using Lidocaine. 1 specimen was taken by a Tissue Culture and sent to the lab per facility protocol. A time out was conducted at 15:13, prior to the start of the procedure. A Minimum amount of bleeding was controlled with Pressure. The procedure was tolerated well with a pain level of 0 throughout and a pain level of 0 following the procedure. Post Debridement Measurements: 3cm length x 6cm width x 2cm depth; 28.274cm^3 volume. Character of Wound/Ulcer Post Debridement is improved. Severity of Tissue Post Debridement is: Fat layer exposed. Post procedure Diagnosis Wound #2: Same as Pre-Procedure Wound #3 Pre-procedure diagnosis of Wound #3 is a Pressure Ulcer located on  the Sacrum . There was a Excisional Skin/Subcutaneous Tissue Chemical/Enzymatic/Mechanical performed by Deon Pilling, RN. to remove Viable and Non-Viable tissue/material. Material removed includes Eschar, Subcutaneous Tissue, Slough, and Skin: Dermis after achieving pain control using Lidocaine. Agent used was [employer]. A time  out was conducted at 15:35, prior to the start of the procedure. A Minimum amount of bleeding was controlled with Pressure. The procedure was tolerated well with a pain level of 0 throughout and a pain level of 0 following the procedure. Post Debridement Measurements: 4.9cm length x 6.9cm width x 0.1cm depth; 2.655cm^3 volume. Post debridement Stage noted as Category/Stage III. Character of Wound/Ulcer Post Debridement is improved. Post procedure Diagnosis Wound #3: Same as Pre-Procedure Wound #4 Pre-procedure diagnosis of Wound #4 is a Pressure Ulcer located on the Right Gluteus . There was a Chemical/Enzymatic/Mechanical debridement performed by Deon Pilling, RN. to remove Viable and Non-Viable tissue/material. Material removed includes Subcutaneous Tissue, Slough, and Skin: Dermis after achieving pain control using Lidocaine. Agent used was [employer]. A time out was conducted at 15:37, prior to the start of the procedure. A Minimum amount of bleeding was controlled with Pressure. The procedure was tolerated well with a pain level of 0 throughout and a pain level of 0 following the procedure. Post Debridement Measurements: 4.4cm length x 0.5cm width x 0.1cm depth; 0.173cm^3 volume. Post debridement Stage noted as Category/Stage III. Character of Wound/Ulcer Post Debridement is improved. Post procedure Diagnosis Wound #4: Same as Pre-Procedure Plan Follow-up Appointments: Return Appointment in 2 weeks. Bathing/ Shower/ Hygiene: May shower and wash wound with soap and water. - may wash on days the wound dressing is to be changed. Dial antibacterial soap is a good soap to use while washing the feet. Feet should not be soaked in anything, only washed and then thoroughly rinsed. Edema Control - Lymphedema / SCD / Other: Elevate legs to the level of the heart or above for 30 minutes daily and/or when sitting, a frequency of: Avoid standing for long periods of time. Off-Loading: Turn and reposition  every 2 hours Other: - Keep pressure off of heels using the bunny boot or multipodus boots. Laboratory ordered were: Anerobic culture - right heel WOUND #1: - T Great Wound Laterality: Right oe Cleanser: Soap and Water Every Other Day/15 Days Discharge Instructions: May shower and wash wound with dial antibacterial soap and water prior to dressing change. Cleanser: Wound Cleanser Every Other Day/15 Days Discharge Instructions: Cleanse the wound with wound cleanser prior to applying a clean dressing using gauze sponges, not tissue or cotton balls. Prim Dressing: KerraCel Ag Gelling Fiber Dressing, 4x5 in (silver alginate) Every Other Day/15 Days ary Discharge Instructions: Apply silver alginate to wound bed as instructed Secondary Dressing: Woven Gauze Sponges 2x2 in Every Other Day/15 Days Discharge Instructions: Apply over primary dressing as directed. Secured With: Child psychotherapist, Sterile 2x75 (in/in) Every Other Day/15 Days Discharge Instructions: Secure with stretch gauze as directed. Secured With: Paper T ape, 1x10 (in/yd) Every Other Day/15 Days Discharge Instructions: Secure dressing with tape as directed. WOUND #2: - Calcaneus Wound Laterality: Right Cleanser: Soap and Water Every Other Day/15 Days Discharge Instructions: May shower and wash wound with dial antibacterial soap and water prior to dressing change. Cleanser: Wound Cleanser Every Other Day/15 Days Discharge Instructions: Cleanse the wound with wound cleanser prior to applying a clean dressing using gauze sponges, not tissue or cotton balls. Peri-Wound Care: Zinc Oxide Ointment 30g tube Every Other Day/15 Days Discharge Instructions: Apply Zinc Oxide  to periwound with each dressing change Prim Dressing: KerraCel Ag Gelling Fiber Dressing, 4x5 in (silver alginate) Every Other Day/15 Days ary Discharge Instructions: Apply silver alginate to wound bed as instructed Secondary Dressing: Woven Gauze Sponge,  Non-Sterile 4x4 in Every Other Day/15 Days Discharge Instructions: Apply over primary dressing as directed. Secondary Dressing: ABD Pad, 5x9 Every Other Day/15 Days Discharge Instructions: Apply over primary dressing as directed. Secondary Dressing: ALLEVYN Heel 4 1/2in x 5 1/2in / 10.5cm x 13.5cm Every Other Day/15 Days Discharge Instructions: Apply over primary dressing as directed. Secured With: [employer], 4.5x3.1 (in/yd) Every Other Day/15 Days Discharge Instructions: Secure with Kerlix as directed. Secured With: Paper T ape, 1x10 (in/yd) Every Other Day/15 Days Discharge Instructions: Secure dressing with tape as directed. Secured With: netting Every Other Day/15 Days Discharge Instructions: may use netting over the kerlix WOUND #3: - Sacrum Wound Laterality: Cleanser: Soap and Water 1 x Per QIO/96 Days Discharge Instructions: May shower and wash wound with dial antibacterial soap and water prior to dressing change. Cleanser: Wound Cleanser 1 x Per Day/15 Days Discharge Instructions: Cleanse the wound with wound cleanser prior to applying a clean dressing using gauze sponges, not tissue or cotton balls. Peri-Wound Care: Skin Prep 1 x Per Day/15 Days Discharge Instructions: Use skin prep as directed Prim Dressing: Santyl Ointment 1 x Per Day/15 Days ary Discharge Instructions: Apply nickel thick amount to wound bed as instructed Secondary Dressing: Woven Gauze Sponge, Non-Sterile 4x4 in 1 x Per Day/15 Days Discharge Instructions: Apply over primary dressing as directed. Secondary Dressing: ComfortFoam Border, 6x6 in (silicone border) 1 x Per Day/15 Days Discharge Instructions: Apply over primary dressing as directed. WOUND #4: - Gluteus Wound Laterality: Right Cleanser: Soap and Water 1 x Per EXB/28 Days Discharge Instructions: May shower and wash wound with dial antibacterial soap and water prior to dressing change. Cleanser: Wound Cleanser 1 x Per Day/15 Days Discharge  Instructions: Cleanse the wound with wound cleanser prior to applying a clean dressing using gauze sponges, not tissue or cotton balls. Peri-Wound Care: Skin Prep 1 x Per Day/15 Days Discharge Instructions: Use skin prep as directed Prim Dressing: Santyl Ointment 1 x Per Day/15 Days ary Discharge Instructions: Apply nickel thick amount to wound bed as instructed Secondary Dressing: Woven Gauze Sponge, Non-Sterile 4x4 in 1 x Per Day/15 Days Discharge Instructions: Apply over primary dressing as directed. Secondary Dressing: ComfortFoam Border, 6x6 in (silicone border) 1 x Per Day/15 Days Discharge Instructions: Apply over primary dressing as directed. #1 I continued silver alginate to the right first toe and the right heel change every 2 2. Await heel culture. Marked odor necrotic tissue. This area will need further debridement I am not sure how far deep this goes. 3. She has not had a recent x-ray with the last one being in December. I reviewed this. This shows intense bone destruction at the tip of the right great toe she has been on doxycycline 4. Buttock wound Santyl with border foam, sacrum with the same dressing 5. Doxycycline can frequently cause nausea and in my experience anorexia. I wonder if this has something to do with her poor intake and weight loss. I am not sure whether this was chosen empirically or as a result of cultures I will need to look back at this 6. Patient is a generally frail woman but alert and conversational. 7. She will need aggressive offloading, of course this is hard to ensure in nursing homes. Electronic Signature(s) Signed: 03/20/2020 5:14:25  PM By: Linton Ham MD Entered By: Linton Ham on 03/20/2020 16:07:17 -------------------------------------------------------------------------------- [facility] Details Patient Name: Date of Service: [name], [name] 03/20/2020 1:15 PM Medical Record Number: [id number] Patient Account Number: [id number] Date of  Birth/Sex: Treating RN: Sep 05, 1945 (75 y.o. Female) Baruch Gouty Primary Care Provider: Hendricks Limes Other Clinician: Referring Provider: Treating Provider/Extender: Mare Ferrari in Treatment: 0 Constitutional Symptoms (General Health) Complaints and Symptoms: Positive for: Fatigue Medical History: Past Medical History Notes: vitamin B12 deficiency Eyes Complaints and Symptoms: Positive for: Glasses / Contacts - reading Medical History: Negative for: Cataracts; Glaucoma; Optic Neuritis Ear/Nose/Mouth/Throat Complaints and Symptoms: Negative for: Chronic sinus problems or rhinitis Respiratory Complaints and Symptoms: Negative for: Chronic or frequent coughs; Shortness of Breath Gastrointestinal Complaints and Symptoms: Negative for: Frequent diarrhea; Nausea; Vomiting Medical History: Past Medical History Notes: constipation Genitourinary Complaints and Symptoms: Negative for: Frequent urination Medical History: Negative for: End Stage Renal Disease Past Medical History Notes: CKD stage 3 Integumentary (Skin) Complaints and Symptoms: Positive for: Wounds - sacrum, right glut, right heel, right great toe Medical History: Negative for: History of Burn Musculoskeletal Complaints and Symptoms: Positive for: Muscle Weakness Medical History: Positive for: Osteomyelitis Neurologic Complaints and Symptoms: Negative for: Numbness/parasthesias Medical History: Past Medical History Notes: CVA Psychiatric Complaints and Symptoms: Negative for: Claustrophobia; Suicidal Medical History: Positive for: Anorexia/bulimia - poor appetite Negative for: Confinement Anxiety Past Medical History Notes: depression Hematologic/Lymphatic Cardiovascular Medical History: Positive for: Congestive Heart Failure; Coronary Artery Disease; Hypertension; Peripheral Arterial Disease Past Medical History Notes: hypertrophic ocstructive cardiomyopathy,  hyperlipidemia, complete heart bloc, pacemaker, aortic stenosis Endocrine Medical History: Positive for: Type II Diabetes Treated with: Insulin Blood sugar tested every day: Yes Tested : 4 times per day at facility Immunological Oncologic Medical History: Negative for: Received Chemotherapy; Received Radiation Past Medical History Notes: adenocarcinoma of breast Immunizations Pneumococcal Vaccine: Received Pneumococcal Vaccination: Yes Implantable Devices No devices added Hospitalization / Surgery History Type of Hospitalization/Surgery lower extremity angiography peripheral balloon angioplasty TEE transcatheter aortic valve replacement cardiac cath pacemaker placed left mastectomy abdominal hysterectomy cholecystectomy Family and Social History Cancer: No; Diabetes: Yes - Mother; Heart Disease: No; Hereditary Spherocytosis: No; Hypertension: Yes - Father; Kidney Disease: No; Lung Disease: No; Seizures: No; Stroke: No; Thyroid Problems: No; Tuberculosis: No; Never smoker; Marital Status - Separated; Alcohol Use: Never; Drug Use: No History; Caffeine Use: Daily; Financial Concerns: No; Food, Clothing or Shelter Needs: No; Support System Lacking: No; Transportation Concerns: Yes - from SNF Electronic Signature(s) Signed: 03/20/2020 5:14:25 PM By: Linton Ham MD Signed: 03/20/2020 5:42:27 PM By: Baruch Gouty RN, BSN Entered By: Baruch Gouty on 03/20/2020 15:09:11 -------------------------------------------------------------------------------- [facility] Details Patient Name: Date of Service: [name] 03/20/2020 Medical Record Number: [id number] Patient Account Number: [id number] Date of Birth/Sex: Treating RN: [birth year] (75 y.o. Female) Rhae Hammock Primary Care Provider: Hendricks Limes Other Clinician: Referring Provider: Treating Provider/Extender: Mare Ferrari in Treatment: 0 Diagnosis Coding ICD-10 Codes Code  Description [phone] Type 2 diabetes mellitus with foot ulcer E11.51 Type 2 diabetes mellitus with diabetic peripheral angiopathy without gangrene M86.671 Other chronic osteomyelitis, right ankle and foot L97.518 Non-pressure chronic ulcer of other part of right foot with other specified severity L97.418 Non-pressure chronic ulcer of right heel and midfoot with other specified severity L89.153 Pressure ulcer of sacral region, stage 3 L89.313 Pressure ulcer of right buttock, stage 3 Facility Procedures CPT4 Code: [id number] Description: 99213 - WOUND CARE VISIT-LEV 3 EST PT Modifier: Quantity:  1 CPT4 Code: [id number] Description: 75051 - DEB SUBQ TISSUE 20 SQ CM/< ICD-10 Diagnosis Description L97.418 Non-pressure chronic ulcer of right heel and midfoot with other specified severit Modifier: y Quantity: 1 Physician Procedures : CPT4 Code Description Modifier [id number] - WC PHYS LEVEL 4 - NEW PT 25 ICD-10 Diagnosis Description L97.518 Non-pressure chronic ulcer of other part of right foot with other specified severity L89.153 Pressure ulcer of sacral region, stage 3  L97.418 Non-pressure chronic ulcer of right heel and midfoot with other specified severity L89.313 Pressure ulcer of right buttock, stage 3 Quantity: 1 : [id number] 11042 - WC PHYS SUBQ TISS 20 SQ CM ICD-10 Diagnosis Description L97.418 Non-pressure chronic ulcer of right heel and midfoot with other specified severity Quantity: 1 Electronic Signature(s) Signed: 03/26/2020 1:23:55 PM By: Linton Ham MD Signed: 04/03/2020 1:48:37 PM By: Rhae Hammock RN Previous Signature: 03/20/2020 5:14:25 PM Version By: Linton Ham MD Entered By: Rhae Hammock on 03/26/2020 11:43:39

## 2020-03-21 NOTE — Progress Notes (Signed)
[name], [name] ([id number]) Visit Report for 03/20/2020 Allergy List Details Patient Name: Date of Service: [name], [name] 03/20/2020 1:15 PM Medical Record Number: [id number] Patient Account Number: [id number] Date of Birth/Sex: Treating RN: [birth year] (75 y.o. Elam Dutch Primary Care Juventino Pavone: Hendricks Limes Other Clinician: Referring Gerrick Ray: Treating Goldie Tregoning/Extender: Malachy Moan Weeks in Treatment: 0 Allergies Active Allergies No Known Allergies Type: Allergen Allergy Notes Electronic Signature(s) Signed: 03/20/2020 5:42:27 PM By: Baruch Gouty RN, BSN Entered By: Baruch Gouty on 03/20/2020 14:29:03 -------------------------------------------------------------------------------- Arrival Information Details Patient Name: Date of Service: [name], [name] 03/20/2020 1:15 PM Medical Record Number: [id number] Patient Account Number: [id number] Date of Birth/Sex: Treating RN: December 20, 1945 (75 y.o. Elam Dutch Primary Care Lovel Suazo: Hendricks Limes Other Clinician: Referring Lindzie Boxx: Treating Brit Carbonell/Extender: Mare Ferrari in Treatment: 0 Visit Information Patient Arrived: Stretcher Arrival Time: 13:57 Accompanied By: EMS Transfer Assistance: Manual Patient Identification Verified: Yes Secondary Verification Process Completed: Yes Patient Requires Transmission-Based Precautions: No Patient Has Alerts: No Electronic Signature(s) Signed: 03/20/2020 5:42:27 PM By: Baruch Gouty RN, BSN Entered By: Baruch Gouty on 03/20/2020 14:02:37 -------------------------------------------------------------------------------- Clinic Level of Care Assessment Details Patient Name: Date of Service: [name], [name] 03/20/2020 1:15 PM Medical Record Number: [id number] Patient Account Number: [id number] Date of Birth/Sex: Treating RN: 05-Aug-1945 (75 y.o. Tonita Phoenix, Lauren Primary Care Bethene Hankinson: Hendricks Limes Other Clinician: Referring Darrill Vreeland: Treating Lizzet Hendley/Extender: Mare Ferrari in Treatment: 0 Clinic Level of Care Assessment Items TOOL 1 Quantity Score X- 1 0 Use when EandM and Procedure is performed on INITIAL visit ASSESSMENTS - Nursing Assessment / Reassessment X- 1 20 General Physical Exam (combine w/ comprehensive assessment (listed just below) when performed on new pt. evals) X- 1 25 Comprehensive Assessment (HX, ROS, Risk Assessments, Wounds Hx, etc.) ASSESSMENTS - Wound and Skin Assessment / Reassessment X- 1 10 Dermatologic / Skin Assessment (not related to wound area) ASSESSMENTS - Ostomy and/or Continence Assessment and Care []  - 0 Incontinence Assessment and Management []  - 0 Ostomy Care Assessment and Management (repouching, etc.) PROCESS - Coordination of Care []  - 0 Simple Patient / Family Education for ongoing care X- 1 20 Complex (extensive) Patient / Family Education for ongoing care X- 1 10 Staff obtains Programmer, systems, Records, T Results / Process Orders est X- 1 10 Staff telephones HHA, Nursing Homes / Clarify orders / etc []  - 0 Routine Transfer to another Facility (non-emergent condition) []  - 0 Routine Hospital Admission (non-emergent condition) X- 1 15 New Admissions / Biomedical engineer / Ordering NPWT Apligraf, etc. , []  - 0 Emergency Hospital Admission (emergent condition) PROCESS - Special Needs []  - 0 Pediatric / Minor Patient Management []  - 0 Isolation Patient Management []  - 0 Hearing / Language / Visual special needs []  - 0 Assessment of Community assistance (transportation, D/[name] planning, etc.) []  - 0 Additional assistance / Altered mentation []  - 0 Support Surface(s) Assessment (bed, cushion, seat, etc.) INTERVENTIONS - Miscellaneous []  - 0 External ear exam []  - 0 Patient Transfer (multiple staff / Civil Service fast streamer / Similar devices) []  - 0 Simple Staple / Suture removal (25 or less) []   - 0 Complex Staple / Suture removal (26 or more) []  - 0 Hypo/Hyperglycemic Management (do not check if billed separately) []  - 0 Ankle / Brachial Index (ABI) - do not check if billed separately Has the patient been seen at the hospital within the last three years: Yes Total Score:  110 Level Of Care: New/Established - Level 3 Electronic Signature(s) Signed: 03/21/2020 5:56:51 PM By: Rhae Hammock RN Entered By: Rhae Hammock on 03/20/2020 15:42:31 -------------------------------------------------------------------------------- Encounter Discharge Information Details Patient Name: Date of Service: [name] 03/20/2020 1:15 PM Medical Record Number: [id number] Patient Account Number: [id number] Date of Birth/Sex: Treating RN: 04-01-1945 (75 y.o. Debby Bud Primary Care Oris Staffieri: Hendricks Limes Other Clinician: Referring Dorathy Stallone: Treating Nana Hoselton/Extender: Mare Ferrari in Treatment: 0 Encounter Discharge Information Items Post Procedure Vitals Discharge Condition: Stable Temperature (F): 98.2 Ambulatory Status: Stretcher Pulse (bpm): 80 Discharge Destination: [place] Respiratory Rate (breaths/min): 18 Telephoned: No Blood Pressure (mmHg): 117/69 Orders Sent: Yes Transportation: Ambulance Accompanied By: EMS Schedule Follow-up Appointment: Yes Clinical Summary of Care: Electronic Signature(s) Signed: 03/20/2020 6:32:06 PM By: Deon Pilling Entered By: Deon Pilling on 03/20/2020 17:55:08 -------------------------------------------------------------------------------- Lower Extremity Assessment Details Patient Name: Date of Service: [name], [name] 03/20/2020 1:15 PM Medical Record Number: [id number] Patient Account Number: [id number] Date of Birth/Sex: Treating RN: 1945-11-27 (75 y.o. Elam Dutch Primary Care Rodrigus Kilker: Hendricks Limes Other Clinician: Referring Osama Coleson: Treating  Sander Remedios/Extender: Malachy Moan Weeks in Treatment: 0 Edema Assessment Assessed: [Left: No] [Right: No] Edema: [Left: N] [Right: o] Calf Left: Right: Point of Measurement: From Medial Instep 29.5 cm Ankle Left: Right: Point of Measurement: From Medial Instep 18.7 cm Vascular Assessment Pulses: Dorsalis Pedis Palpable: [Right:Yes] Electronic Signature(s) Signed: 03/20/2020 5:42:27 PM By: Baruch Gouty RN, BSN Entered By: Baruch Gouty on 03/20/2020 14:45:01 -------------------------------------------------------------------------------- Multi Wound Chart Details Patient Name: Date of Service: [name] 03/20/2020 1:15 PM Medical Record Number: [id number] Patient Account Number: [id number] Date of Birth/Sex: Treating RN: 1945/07/02 (75 y.o. Tonita Phoenix, Lauren Primary Care Aerik Polan: Hendricks Limes Other Clinician: Referring Leara Rawl: Treating Giuseppe Duchemin/Extender: Mare Ferrari in Treatment: 0 Vital Signs Height(in): 40 Pulse(bpm): 80 Weight(lbs): 165 Blood Pressure(mmHg): 117/69 Body Mass Index(BMI): 27 Temperature(F): 98.2 Respiratory Rate(breaths/min): 18 Photos: [1:No Photos Right T Great oe] [2:No Photos Right Calcaneus] [3:No Photos Sacrum] Wound Location: [1:Gradually Appeared] [2:Pressure Injury] [3:Pressure Injury] Wounding Event: [1:Diabetic Wound/Ulcer of the Lower] [2:Diabetic Wound/Ulcer of the Lower] [3:Pressure Ulcer] Primary Etiology: [1:Extremity Congestive Heart Failure, Coronary] [2:Extremity Congestive Heart Failure, Coronary] [3:Congestive Heart Failure, Coronary] Comorbid History: [1:Artery Disease, Hypertension, Peripheral Arterial Disease, Type II Diabetes, Osteomyelitis, Anorexia/bulimia 01/25/2020] [2:Artery Disease, Hypertension, Peripheral Arterial Disease, Type II Diabetes, Osteomyelitis, Anorexia/bulimia  01/25/2020] [3:Artery Disease, Hypertension, Peripheral Arterial Disease, Type II  Diabetes, Osteomyelitis, Anorexia/bulimia 01/25/2020] Date Acquired: [1:0] [2:0] [3:0] Weeks of Treatment: [1:Open] [2:Open] [3:Open] Wound Status: [1:0.4x1.2x0.6] [2:3x6x1.1] [3:4.9x6.9x0.1] Measurements L x W x D (cm) [1:0.377] [2:14.137] [3:26.554] A (cm) : rea [1:0.226] [2:15.551] [3:2.655] Volume (cm) : [1:Grade 3] [2:Grade 2] [3:Category/Stage III] Classification: [1:X-Ray] [2:N/A] [3:N/A] Wagner Verification: [1:Medium] [2:Medium] [3:Medium] Exudate A mount: [1:Sanguinous] [2:Serosanguineous] [3:Serosanguineous] Exudate Type: [1:red] [2:red, brown] [3:red, brown] Exudate Color: [1:No] [2:Yes] [3:No] Foul Odor A Cleansing: [1:fter N/A] [2:No] [3:N/A] Odor A nticipated Due to Product Use: [1:Distinct, outline attached] [2:Flat and Intact] [3:Flat and Intact] Wound Margin: [1:Large (67-100%)] [2:None Present (0%)] [3:Small (1-33%)] Granulation A mount: [1:Red, Friable] [2:N/A] [3:Red, Pink] Granulation Quality: [1:None Present (0%)] [2:Large (67-100%)] [3:Large (67-100%)] Necrotic A mount: [1:N/A] [2:Eschar, Adherent Slough] [3:Eschar, Adherent Slough] Necrotic Tissue: [1:Fat Layer (Subcutaneous Tissue): Yes Fat Layer (Subcutaneous Tissue): Yes Fat Layer (Subcutaneous Tissue): Yes] Exposed Structures: [1:Bone: Yes Fascia: No Tendon: No Muscle: No Joint: No Small (1-33%)] [2:Fascia: No Tendon: No Muscle: No  Joint: No Bone: No Small (1-33%)] [3:Fascia: No Tendon: No Muscle: No Joint: No Bone: No Small (1-33%)] Epithelialization: [1:N/A] [2:Debridement - Excisional] [3:Chemical/Enzymatic/Mechanical -] Debridement: [3:Excisional] Pre-procedure Verification/Time Out N/A [2:15:13] [3:15:35] Taken: [1:N/A] [2:Lidocaine] [3:Lidocaine] Pain Control: [1:N/A] [2:Necrotic/Eschar, Subcutaneous,] [3:Necrotic/Eschar, Subcutaneous,] Tissue Debrided: [1:N/A] [2:Slough Skin/Subcutaneous Tissue] [3:Slough Skin/Subcutaneous Tissue] Level: [1:N/A] [2:18] [3:N/A] Debridement A (sq cm): [1:rea N/A]  [2:Curette, Forceps] [3:N/A] Instrument: [1:N/A] [2:Minimum] [3:Minimum] Bleeding: [1:N/A] [2:Pressure] [3:Pressure] Hemostasis Achieved: [1:N/A] [2:0] [3:0] Procedural Pain: [1:N/A] [2:0] [3:0] Post Procedural Pain: [1:N/A] [2:Procedure was tolerated well] [3:Procedure was tolerated well] Debridement Treatment Response: [1:N/A] [2:3x6x2] [3:4.9x6.9x0.1] Post Debridement Measurements L x W x D (cm) [1:N/A] [2:28.274] [3:2.655] Post Debridement Volume: (cm) [1:N/A] [2:N/A] [3:Category/Stage III] Post Debridement Stage: [1:N/A] [2:Debridement] [3:Debridement] Wound Number: 4 N/A N/A Photos: No Photos N/A N/A Right Gluteus N/A N/A Wound Location: Pressure Injury N/A N/A Wounding Event: Pressure Ulcer N/A N/A Primary Etiology: Congestive Heart Failure, Coronary N/A N/A Comorbid History: Artery Disease, Hypertension, Peripheral Arterial Disease, Type II Diabetes, Osteomyelitis, Anorexia/bulimia 01/25/2020 N/A N/A Date Acquired: 0 N/A N/A Weeks of Treatment: Open N/A N/A Wound Status: 4.4x0.5x0.1 N/A N/A Measurements L x W x D (cm) 1.728 N/A N/A A (cm) : rea 0.173 N/A N/A Volume (cm) : Category/Stage III N/A N/A Classification: N/A N/A N/A Earleen Newport Verification: Small N/A N/A Exudate A mount: Serosanguineous N/A N/A Exudate Type: red, brown N/A N/A Exudate Color: No N/A N/A Foul Odor A Cleansing: fter N/A N/A N/A Odor A nticipated Due to Product Use: Flat and Intact N/A N/A Wound Margin: Medium (34-66%) N/A N/A Granulation A mount: Pink N/A N/A Granulation Quality: Medium (34-66%) N/A N/A Necrotic A mount: Adherent Slough N/A N/A Necrotic Tissue: Fat Layer (Subcutaneous Tissue): Yes N/A N/A Exposed Structures: Fascia: No Tendon: No Muscle: No Joint: No Bone: No Small (1-33%) N/A N/A Epithelialization: Chemical/Enzymatic/Mechanical - N/A N/A Debridement: Excisional Pre-procedure Verification/Time Out 15:37 N/A N/A Taken: Lidocaine N/A N/A Pain  Control: Subcutaneous, Slough N/A N/A Tissue Debrided: N/A N/A N/A Level: N/A N/A N/A Debridement A (sq cm): rea N/A N/A N/A Instrument: Minimum N/A N/A Bleeding: Pressure N/A N/A Hemostasis A chieved: 0 N/A N/A Procedural Pain: 0 N/A N/A Post Procedural Pain: Procedure was tolerated well N/A N/A Debridement Treatment Response: 4.4x0.5x0.1 N/A N/A Post Debridement Measurements L x W x D (cm) 0.173 N/A N/A Post Debridement Volume: (cm) Category/Stage III N/A N/A Post Debridement Stage: Debridement N/A N/A Procedures Performed: Treatment Notes Electronic Signature(s) Signed: 03/20/2020 5:14:25 PM By: Linton Ham MD Signed: 03/21/2020 5:56:51 PM By: Rhae Hammock RN Entered By: Linton Ham on 03/20/2020 15:40:56 -------------------------------------------------------------------------------- Multi-Disciplinary Care Plan Details Patient Name: Date of Service: [name], [name] 03/20/2020 1:15 PM Medical Record Number: [id number] Patient Account Number: [id number] Date of Birth/Sex: Treating RN: [birth year] (75 y.o. Tonita Phoenix, Lauren Primary Care Shaquella Stamant: Hendricks Limes Other Clinician: Referring Jilliana Burkes: Treating Reilynn Lauro/Extender: Mare Ferrari in Treatment: 0 Active Inactive Orientation to the Wound Care Program Nursing Diagnoses: Knowledge deficit related to the wound healing center program Goals: Patient/caregiver will verbalize understanding of the [place] Date Initiated: 03/20/2020 Target Resolution Date: 03/30/2020 Goal Status: Active Interventions: Provide education on orientation to the wound center Notes: Wound/Skin Impairment Nursing Diagnoses: Impaired tissue integrity Goals: Patient will have a decrease in wound volume by X% from date: (specify in notes) Date Initiated: 03/20/2020 Target Resolution Date: 03/30/2020 Goal Status: Active Patient/caregiver will verbalize understanding  of skin care regimen Date Initiated: 03/20/2020 Target Resolution Date:  03/30/2020 Goal Status: Active Ulcer/skin breakdown will have a volume reduction of 30% by week 4 Date Initiated: 03/20/2020 Target Resolution Date: 03/30/2020 Goal Status: Active Interventions: Assess patient/caregiver ability to obtain necessary supplies Assess patient/caregiver ability to perform ulcer/skin care regimen upon admission and as needed Assess ulceration(s) every visit Notes: Electronic Signature(s) Signed: 03/21/2020 5:56:51 PM By: Rhae Hammock RN Entered By: Rhae Hammock on 03/20/2020 15:41:19 -------------------------------------------------------------------------------- Pain Assessment Details Patient Name: Date of Service: [name], [name] 03/20/2020 1:15 PM Medical Record Number: [id number] Patient Account Number: [id number] Date of Birth/Sex: Treating RN: [birth year] (75 y.o. Elam Dutch Primary Care Koury Roddy: Hendricks Limes Other Clinician: Referring Cristiano Capri: Treating Shlome Baldree/Extender: Mare Ferrari in Treatment: 0 Active Problems Location of Pain Severity and Description of Pain Patient Has Paino Yes Site Locations Pain Location: Pain Location: Pain in Ulcers With Dressing Change: Yes Duration of the Pain. Constant / Intermittento Intermittent Rate the pain. Current Pain Level: 0 Worst Pain Level: 8 Least Pain Level: 0 Character of Pain Describe the Pain: Throbbing Pain Management and Medication Current Pain Management: Medication: Yes Leg drop or elevation: Yes Is the Current Pain Management Adequate: Adequate How does your wound impact your activities of daily livingo Sleep: Yes Bathing: No Appetite: No Relationship With Others: No Bladder Continence: No Emotions: No Bowel Continence: No Hobbies: No Toileting: No Dressing: No Electronic Signature(s) Signed: 03/20/2020 5:42:27 PM By: Baruch Gouty RN, BSN Entered  By: Baruch Gouty on 03/20/2020 14:53:15 -------------------------------------------------------------------------------- Patient/Caregiver Education Details Patient Name: Date of Service: [name] 1/25/2022andnbsp1:15 PM Medical Record Number: [id number] Patient Account Number: [id number] Date of Birth/Gender: Treating RN: [birth year] (75 y.o. Tonita Phoenix, Lauren Primary Care Physician: Hendricks Limes Other Clinician: Referring Physician: Treating Physician/Extender: Mare Ferrari in Treatment: 0 Education Assessment Education Provided To: Patient Education Topics Provided Welcome T [facility] [place]: o Methods: Explain/Verbal Responses: Reinforcements needed Electronic Signature(s) Signed: 03/21/2020 5:56:51 PM By: Rhae Hammock RN Entered By: Rhae Hammock on 03/20/2020 15:41:30 -------------------------------------------------------------------------------- Wound Assessment Details Patient Name: Date of Service: [name], [name] 03/20/2020 1:15 PM Medical Record Number: [id number] Patient Account Number: [id number] Date of Birth/Sex: Treating RN: 1945/12/29 (75 y.o. Elam Dutch Primary Care Mallissa Lorenzen: Hendricks Limes Other Clinician: Referring Chrisann Melaragno: Treating Analyse Angst/Extender: Malachy Moan Weeks in Treatment: 0 Wound Status Wound Number: 1 Primary Diabetic Wound/Ulcer of the Lower Extremity Etiology: Wound Location: Right T Great oe Wound Open Wounding Event: Gradually Appeared Status: Date Acquired: 01/25/2020 Comorbid Congestive Heart Failure, Coronary Artery Disease, Hypertension, Weeks Of Treatment: 0 History: Peripheral Arterial Disease, Type II Diabetes, Osteomyelitis, Clustered Wound: No Anorexia/bulimia Wound Measurements Length: (cm) 0.4 Width: (cm) 1.2 Depth: (cm) 0.6 Area: (cm) 0.377 Volume: (cm) 0.226 % Reduction in Area: % Reduction in  Volume: Epithelialization: Small (1-33%) Tunneling: No Undermining: No Wound Description Classification: Grade 3 Wagner Verification: X-Ray Wound Margin: Distinct, outline attached Exudate Amount: Medium Exudate Type: Sanguinous Exudate Color: red Foul Odor After Cleansing: No Slough/Fibrino No Wound Bed Granulation Amount: Large (67-100%) Exposed Structure Granulation Quality: Red, Friable Fascia Exposed: No Necrotic Amount: None Present (0%) Fat Layer (Subcutaneous Tissue) Exposed: Yes Tendon Exposed: No Muscle Exposed: No Joint Exposed: No Bone Exposed: Yes Treatment Notes Wound #1 (Toe Great) Wound Laterality: Right Cleanser Soap and Water Discharge Instruction: May shower and wash wound with dial antibacterial soap and water prior to dressing change. Wound Cleanser Discharge Instruction: Cleanse the wound with wound cleanser prior to  applying a clean dressing using gauze sponges, not tissue or cotton balls. Peri-Wound Care Topical Primary Dressing KerraCel Ag Gelling Fiber Dressing, 4x5 in (silver alginate) Discharge Instruction: Apply silver alginate to wound bed as instructed Secondary Dressing Woven Gauze Sponges 2x2 in Discharge Instruction: Apply over primary dressing as directed. Secured With Conforming Stretch Gauze Bandage, Sterile 2x75 (in/in) Discharge Instruction: Secure with stretch gauze as directed. Paper Tape, 1x10 (in/yd) Discharge Instruction: Secure dressing with tape as directed. Compression Wrap Compression Stockings Add-Ons Electronic Signature(s) Signed: 03/20/2020 5:42:27 PM By: Baruch Gouty RN, BSN Entered By: Baruch Gouty on 03/20/2020 14:48:14 -------------------------------------------------------------------------------- Wound Assessment Details Patient Name: Date of Service: [name], [name] 03/20/2020 1:15 PM Medical Record Number: [id number] Patient Account Number: [id number] Date of Birth/Sex: Treating RN: 1945-09-25  (75 y.o. Elam Dutch Primary Care Kyler Lerette: Hendricks Limes Other Clinician: Referring Lyberti Thrush: Treating Josilynn Losh/Extender: Malachy Moan Weeks in Treatment: 0 Wound Status Wound Number: 2 Primary Diabetic Wound/Ulcer of the Lower Extremity Etiology: Wound Location: Right Calcaneus Wound Open Wounding Event: Pressure Injury Status: Date Acquired: 01/25/2020 Comorbid Congestive Heart Failure, Coronary Artery Disease, Hypertension, Weeks Of Treatment: 0 History: Peripheral Arterial Disease, Type II Diabetes, Osteomyelitis, Clustered Wound: No Anorexia/bulimia Wound Measurements Length: (cm) 3 Width: (cm) 6 Depth: (cm) 1.1 Area: (cm) 14.137 Volume: (cm) 15.551 % Reduction in Area: % Reduction in Volume: Epithelialization: Small (1-33%) Tunneling: No Undermining: No Wound Description Classification: Grade 2 Wound Margin: Flat and Intact Exudate Amount: Medium Exudate Type: Serosanguineous Exudate Color: red, brown Foul Odor After Cleansing: Yes Due to Product Use: No Slough/Fibrino Yes Wound Bed Granulation Amount: None Present (0%) Exposed Structure Necrotic Amount: Large (67-100%) Fascia Exposed: No Necrotic Quality: Eschar, Adherent Slough Fat Layer (Subcutaneous Tissue) Exposed: Yes Tendon Exposed: No Muscle Exposed: No Joint Exposed: No Bone Exposed: No Treatment Notes Wound #2 (Calcaneus) Wound Laterality: Right Cleanser Soap and Water Discharge Instruction: May shower and wash wound with dial antibacterial soap and water prior to dressing change. Wound Cleanser Discharge Instruction: Cleanse the wound with wound cleanser prior to applying a clean dressing using gauze sponges, not tissue or cotton balls. Peri-Wound Care Zinc Oxide Ointment 30g tube Discharge Instruction: Apply Zinc Oxide to periwound with each dressing change Topical Primary Dressing KerraCel Ag Gelling Fiber Dressing, 4x5 in (silver alginate) Discharge  Instruction: Apply silver alginate to wound bed as instructed Secondary Dressing Woven Gauze Sponge, Non-Sterile 4x4 in Discharge Instruction: Apply over primary dressing as directed. ABD Pad, 5x9 Discharge Instruction: Apply over primary dressing as directed. ALLEVYN Heel 4 1/2in x 5 1/2in / 10.5cm x 13.5cm Discharge Instruction: Apply over primary dressing as directed. Secured With [employer], 4.5x3.1 (in/yd) Discharge Instruction: Secure with Kerlix as directed. Paper Tape, 1x10 (in/yd) Discharge Instruction: Secure dressing with tape as directed. netting Discharge Instruction: may use netting over the kerlix Compression Wrap Compression Stockings Add-Ons Electronic Signature(s) Signed: 03/20/2020 5:42:27 PM By: Baruch Gouty RN, BSN Entered By: Baruch Gouty on 03/20/2020 14:49:44 -------------------------------------------------------------------------------- Wound Assessment Details Patient Name: Date of Service: [name], [name] 03/20/2020 1:15 PM Medical Record Number: [id number] Patient Account Number: [id number] Date of Birth/Sex: Treating RN: Aug 03, 1945 (74 y.o. Elam Dutch Primary Care Corley Kohls: Hendricks Limes Other Clinician: Referring Ishmail Mcmanamon: Treating Alenna Russell/Extender: Malachy Moan Weeks in Treatment: 0 Wound Status Wound Number: 3 Primary Pressure Ulcer Etiology: Wound Location: Sacrum Wound Open Wounding Event: Pressure Injury Status: Date Acquired: 01/25/2020 Comorbid Congestive Heart Failure, Coronary Artery Disease, Hypertension, Weeks Of  Treatment: 0 History: Peripheral Arterial Disease, Type II Diabetes, Osteomyelitis, Clustered Wound: No Anorexia/bulimia Wound Measurements Length: (cm) 4.9 Width: (cm) 6.9 Depth: (cm) 0.1 Area: (cm) 26.554 Volume: (cm) 2.655 % Reduction in Area: % Reduction in Volume: Epithelialization: Small (1-33%) Tunneling: No Undermining: No Wound  Description Classification: Category/Stage III Wound Margin: Flat and Intact Exudate Amount: Medium Exudate Type: Serosanguineous Exudate Color: red, brown Foul Odor After Cleansing: No Slough/Fibrino Yes Wound Bed Granulation Amount: Small (1-33%) Exposed Structure Granulation Quality: Red, Pink Fascia Exposed: No Necrotic Amount: Large (67-100%) Fat Layer (Subcutaneous Tissue) Exposed: Yes Necrotic Quality: Eschar, Adherent Slough Tendon Exposed: No Muscle Exposed: No Joint Exposed: No Bone Exposed: No Treatment Notes Wound #3 (Sacrum) Cleanser Soap and Water Discharge Instruction: May shower and wash wound with dial antibacterial soap and water prior to dressing change. Wound Cleanser Discharge Instruction: Cleanse the wound with wound cleanser prior to applying a clean dressing using gauze sponges, not tissue or cotton balls. Peri-Wound Care Skin Prep Discharge Instruction: Use skin prep as directed Topical Primary Dressing Santyl Ointment Discharge Instruction: Apply nickel thick amount to wound bed as instructed Secondary Dressing Woven Gauze Sponge, Non-Sterile 4x4 in Discharge Instruction: Apply over primary dressing as directed. ComfortFoam Border, 6x6 in (silicone border) Discharge Instruction: Apply over primary dressing as directed. Secured With Compression Wrap Compression Stockings Environmental education officer) Signed: 03/20/2020 5:42:27 PM By: Baruch Gouty RN, BSN Entered By: Baruch Gouty on 03/20/2020 14:51:04 -------------------------------------------------------------------------------- Wound Assessment Details Patient Name: Date of Service: [name], [name] 03/20/2020 1:15 PM Medical Record Number: [id number] Patient Account Number: [id number] Date of Birth/Sex: Treating RN: 1945/11/27 (75 y.o. Elam Dutch Primary Care Eliyah Bazzi: Hendricks Limes Other Clinician: Referring Kalief Kattner: Treating Massiel Stipp/Extender: Malachy Moan Weeks in Treatment: 0 Wound Status Wound Number: 4 Primary Pressure Ulcer Etiology: Wound Location: Right Gluteus Wound Open Wounding Event: Pressure Injury Status: Date Acquired: 01/25/2020 Comorbid Congestive Heart Failure, Coronary Artery Disease, Hypertension, Weeks Of Treatment: 0 History: Peripheral Arterial Disease, Type II Diabetes, Osteomyelitis, Clustered Wound: No Anorexia/bulimia Wound Measurements Length: (cm) 4.4 Width: (cm) 0.5 Depth: (cm) 0.1 Area: (cm) 1.728 Volume: (cm) 0.173 % Reduction in Area: % Reduction in Volume: Epithelialization: Small (1-33%) Tunneling: No Undermining: No Wound Description Classification: Category/Stage III Wound Margin: Flat and Intact Exudate Amount: Small Exudate Type: Serosanguineous Exudate Color: red, brown Foul Odor After Cleansing: No Slough/Fibrino Yes Wound Bed Granulation Amount: Medium (34-66%) Exposed Structure Granulation Quality: Pink Fascia Exposed: No Necrotic Amount: Medium (34-66%) Fat Layer (Subcutaneous Tissue) Exposed: Yes Necrotic Quality: Adherent Slough Tendon Exposed: No Muscle Exposed: No Joint Exposed: No Bone Exposed: No Treatment Notes Wound #4 (Gluteus) Wound Laterality: Right Cleanser Soap and Water Discharge Instruction: May shower and wash wound with dial antibacterial soap and water prior to dressing change. Wound Cleanser Discharge Instruction: Cleanse the wound with wound cleanser prior to applying a clean dressing using gauze sponges, not tissue or cotton balls. Peri-Wound Care Skin Prep Discharge Instruction: Use skin prep as directed Topical Primary Dressing Santyl Ointment Discharge Instruction: Apply nickel thick amount to wound bed as instructed Secondary Dressing Woven Gauze Sponge, Non-Sterile 4x4 in Discharge Instruction: Apply over primary dressing as directed. ComfortFoam Border, 6x6 in (silicone border) Discharge Instruction: Apply over  primary dressing as directed. Secured With Compression Wrap Compression Stockings Environmental education officer) Signed: 03/20/2020 5:42:27 PM By: Baruch Gouty RN, BSN Entered By: Baruch Gouty on 03/20/2020 14:52:08 -------------------------------------------------------------------------------- Vitals Details Patient Name: Date of Service: [name], [name]. 03/20/2020  1:15 PM Medical Record Number: [id number] Patient Account Number: [id number] Date of Birth/Sex: Treating RN: 17-Dec-1945 (75 y.o. Elam Dutch Primary Care Vu Liebman: Hendricks Limes Other Clinician: Referring Cayden Rautio: Treating Alianis Trimmer/Extender: Mare Ferrari in Treatment: 0 Vital Signs Time Taken: 14:25 Temperature (F): 98.2 Height (in): 66 Pulse (bpm): 80 Source: Stated Respiratory Rate (breaths/min): 18 Weight (lbs): 165 Blood Pressure (mmHg): 117/69 Source: Stated Reference Range: 80 - 120 mg / dl Body Mass Index (BMI): 26.6 Notes glucose monitored at facility Electronic Signature(s) Signed: 03/20/2020 5:42:27 PM By: Baruch Gouty RN, BSN Entered By: Baruch Gouty on 03/20/2020 14:28:37

## 2020-03-22 ENCOUNTER — Encounter: Payer: Self-pay | Admitting: Adult Health

## 2020-03-22 ENCOUNTER — Non-Acute Institutional Stay (SKILLED_NURSING_FACILITY): Payer: Medicare Other | Admitting: Adult Health

## 2020-03-22 DIAGNOSIS — E875 Hyperkalemia: Secondary | ICD-10-CM | POA: Diagnosis not present

## 2020-03-22 DIAGNOSIS — N179 Acute kidney failure, unspecified: Secondary | ICD-10-CM

## 2020-03-22 NOTE — Progress Notes (Deleted)
Location:  [place] Room Number: 217-A Place of Service:  SNF (31) Provider:  Durenda Age, DNP, FNP-BC  Patient Care Team: Hendricks Limes, MD as PCP - General (Internal Medicine) Dorothy Spark, MD as PCP - Cardiology (Cardiology) Constance Haw, MD as PCP - Electrophysiology (Cardiology) Medina-Vargas, Senaida Lange, NP as Nurse Practitioner (Internal Medicine) Rehab, [facility] ([place])  Extended Emergency Contact Information Primary Emergency Contact: Lieutenant [place] of [place] Phone: [phone] Mobile Phone: [phone] Relation: Daughter Secondary Emergency Contact: Armstrong,[name]  [place] Phone: [phone] Relation: Daughter  Code Status:  FULL CODE   Goals of care: Advanced Directive information Advanced Directives 03/16/2020  Does Patient Have a Medical Advance Directive? No  Type of Advance Directive -  Does patient want to make changes to medical advance directive? -  Copy of [place] in Chart? -  Would patient like information on creating a medical advance directive? No - Patient declined  Pre-existing out of facility DNR order (yellow form or pink MOST form) -     Chief Complaint  Patient presents with  . Acute Visit    Patient is seen acutely for poor urinary output.     HPI:  Pt is a 75 y.o. female seen today poor urinary output. She is a long-term care resident of [facility]. She has a PMH of   Past Medical History:  Diagnosis Date  . Acute on chronic diastolic CHF (congestive heart failure) ([place]) 06/22/2017  . Acute on chronic renal failure ([place]) 05/25/2011  . Adenocarcinoma of breast ([place]) 1996   Completed tamoxifen and had mastectomy.  . Aortic stenosis, severe    s/p aortic valve replacement with porcine valve 06/2004.  ECHO 2010 EF 83%, LVH, diastolic dysfxn, Bioprostetic aoritc valve, mild  AS. ECHO 2013 EF 60%, Nl aortic artificial valve, dynamic obstruction in the outflow tract   Class IIb rec for annual TTE after 5 yrs. She had a TTE 2013    . Arthritis   . CAD (coronary artery disease) 2006   s/p CABG (5/06) w/ saphenous vein to RCA at time of AVR  . CKD (chronic kidney disease) stage 2, GFR 60-89 ml/min 06/03/2011   She is no longer taking NSAIDs.   Marland Kitchen CVA (cerebral infarction) 2006   Post-op from AVR. Presumed embolic in nature. Carotid stenosis of R 60-79%. Repeat dopplers 4/10 no R stenosis and L stenosis of 1-29%.  . Depression    Controlled on Paxil  . Diabetes mellitus 1992   Dx 04/25/1990. Now insulin dependent, started 2008. On ACEI.   . Diverticulosis 2001  . History of 2019 novel coronavirus disease (COVID-19)   . Hyperlipidemia    Mgmt with a statin  . Hypertension    Requires 4 drug tx  . Osteoporosis 2006   DEXA 10/06 : L femur T -2.8, R -2.7. Lumbar T -2.4. On bisphosphonates and  Calcium / Vit D.  . Peripheral vascular disease ([place])   . Presence of permanent cardiac pacemaker   . Refusal of blood transfusions as patient is Jehovah's Witness   . Stroke [facility])    Past Surgical History:  Procedure Laterality Date  . ABDOMINAL HYSTERECTOMY  1987   for fibroids  . AORTIC VALVE REPLACEMENT  2006  . CHOLECYSTECTOMY    . CORONARY ARTERY BYPASS GRAFT  2006   Saphenous vein to RCA at time of AVR. Course complicated by acute  respiratory failure, post-op PTX, ARI, ileus, CVA  . LOWER EXTREMITY ANGIOGRAPHY N/A 01/04/2020   Procedure: LOWER EXTREMITY ANGIOGRAPHY - Right;  Surgeon: Cherre Robins, MD;  Location: [place] CV LAB;  Service: Cardiovascular;  Laterality: N/A;  . MASTECTOMY Left 1995   L for adenocarcinoma  . PACEMAKER IMPLANT N/A 02/09/2019   Procedure: PACEMAKER IMPLANT;  Surgeon: Constance Haw, MD;  Location: [place] CV LAB;  Service: Cardiovascular;  Laterality: N/A;  . PERIPHERAL VASCULAR BALLOON ANGIOPLASTY  01/04/2020    Procedure: PERIPHERAL VASCULAR BALLOON ANGIOPLASTY;  Surgeon: Cherre Robins, MD;  Location: [place] CV LAB;  Service: Cardiovascular;;  Rt AT  . RIGHT HEART CATH AND CORONARY/GRAFT ANGIOGRAPHY N/A 02/09/2019   Procedure: RIGHT HEART CATH AND CORONARY/GRAFT ANGIOGRAPHY;  Surgeon: Sherren Mocha, MD;  Location: [place] CV LAB;  Service: Cardiovascular;  Laterality: N/A;  . TEE WITHOUT CARDIOVERSION N/A 07/20/2019   Procedure: TRANSESOPHAGEAL ECHOCARDIOGRAM (TEE);  Surgeon: Jerline Pain, MD;  [facility] ENDOSCOPY;  Service: Cardiovascular;  Laterality: N/A;  . TRANSCATHETER AORTIC VALVE REPLACEMENT, TRANSFEMORAL N/A 03/01/2019   Procedure: TRANSCATHETER AORTIC VALVE REPLACEMENT, TRANSFEMORAL;  Surgeon: Sherren Mocha, MD;  Location: [place] CV LAB;  Service: Open Heart Surgery;  Laterality: N/A;    Allergies  Allergen Reactions  . 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," as the patient is a Jehovah's Witness    Outpatient Encounter Medications as of 03/22/2020  Medication Sig  . acetaminophen (TYLENOL) 325 MG tablet Take 650 mg by mouth every 6 (six) hours as needed. Notify MD id not relieved (Not to exceed 3000 mg ) in 24 hour per stranding order  . acetaminophen (TYLENOL) 325 MG tablet Take 650 mg by mouth in the morning.  . Amino Acids-Protein Hydrolys (FEEDING SUPPLEMENT, PRO-STAT SUGAR FREE 64,) LIQD Take 30 mLs by mouth in the morning and at bedtime.  Marland Kitchen amLODipine (NORVASC) 5 MG tablet Take 1 tablet (5 mg total) by mouth daily.  Marland Kitchen amoxicillin (AMOXIL) 500 MG capsule Take 2,000 mg by mouth as needed. Prior to dental procedure  . aspirin 81 MG EC tablet TAKE 1 TABLET (81 MG TOTAL) BY MOUTH DAILY. SWALLOW WHOLE.  Marland Kitchen atorvastatin (LIPITOR) 80 MG tablet Take 1 tablet (80 mg total) by mouth at bedtime.  . clopidogrel (PLAVIX) 75 MG tablet Take 1 tablet (75 mg total) by mouth daily with breakfast.  . diclofenac Sodium (VOLTAREN) 1 % GEL Apply 4 g topically 4 (four)  times daily as needed (Pain). Apply to bilateral hips and knees  . DULoxetine (CYMBALTA) 30 MG capsule Take 30 mg by mouth daily.  . Ensure (ENSURE) Take 237 mLs by mouth in the morning and at bedtime. Strawberry if available  . gabapentin (NEURONTIN) 100 MG capsule Take 100 mg by mouth at bedtime.  . hydrALAZINE (APRESOLINE) 50 MG tablet Take 1 tablet (50 mg total) by mouth 3 (three) times daily.  . Insulin Degludec-Liraglutide (XULTOPHY) 100-3.6 UNIT-MG/ML SOPN Inject 14 Units into the skin daily.  Marland Kitchen lactulose (CHRONULAC) 10 GM/15ML solution Take 30 g by mouth 2 (two) times daily.  . metoprolol succinate (TOPROL-XL) 25 MG 24 hr tablet Take 1 tablet (25 mg total) by mouth daily.  . Multiple Vitamins-Minerals (DECUBI-VITE) CAPS Take 1 capsule by mouth daily.  . potassium chloride SA (KLOR-CON) 20 MEQ tablet Take 20 mEq by mouth 2 (two) times daily.  Marland Kitchen saccharomyces boulardii (FLORASTOR) 250 MG capsule Take 250 mg by mouth 2 (two) times daily.  No facility-administered encounter medications on file as of 03/22/2020.    Review of Systems  GENERAL: No change in appetite, no fatigue, no weight changes, no fever, chills or weakness SKIN: Denies rash, itching, wounds, ulcer sores, or nail abnormalities EYES: Denies change in vision, dry eyes, eye pain, itching or discharge EARS: Denies change in hearing, ringing in ears, or earache NOSE: Denies nasal congestion or epistaxis MOUTH and THROAT: Denies oral discomfort, gingival pain or bleeding, pain from teeth or hoarseness   RESPIRATORY: no cough, SOB, DOE, wheezing, hemoptysis CARDIAC: No chest pain, edema or palpitations GI: No abdominal pain, diarrhea, constipation, heart burn, nausea or vomiting GU: Denies dysuria, frequency, hematuria, incontinence, or discharge MUSCULOSKELETAL: Denies joint pain, muscle pain, back pain, restricted movement, or unusual weakness CIRCULATION: Denies claudication, edema of legs, varicosities, or cold  extremities NEUROLOGICAL: Denies dizziness, syncope, numbness, or headache PSYCHIATRIC: Denies feelings of depression or anxiety. No report of hallucinations, insomnia, paranoia, or agitation ENDOCRINE: Denies polyphagia, polyuria, polydipsia, heat or cold intolerance HEME/LYMPH: Denies excessive bruising, petechia, enlarged lymph nodes, or bleeding problems IMMUNOLOGIC: Denies history of frequent infections, AIDS, or use of immunosuppressive agents   Immunization History  Administered Date(s) Administered  . Fluad Quad(high Dose 65+) 02/11/2019  . Influenza Split 11/12/2010, 11/10/2011  . Influenza Whole 11/07/2009  . Influenza,inj,Quad PF,6+ Mos 10/19/2012, 01/05/2014, 11/08/2015, 01/29/2017  . Influenza-Unspecified 12/15/2019  . Moderna Sars-Covid-2 Vaccination 03/09/2019, 04/06/2019  . Pneumococcal Conjugate-13 06/07/2014  . Pneumococcal Polysaccharide-23 12/26/1994, 03/05/2010  . Td 09/24/1993  . Tdap 11/12/2010, 07/12/2013   Pertinent  Health Maintenance Due  Topic Date Due  . OPHTHALMOLOGY EXAM  11/07/2016  . FOOT EXAM  07/10/2017  . LIPID PANEL  02/07/2020  . HEMOGLOBIN A1C  04/05/2020  . INFLUENZA VACCINE  Completed  . DEXA SCAN  Completed  . PNA vac Low Risk Adult  Completed   Fall Risk  01/10/2020 08/02/2019 06/16/2019 05/19/2019 05/04/2019  Falls in the past year? $RemoveBe'1 1 1 1 'uYgrbyeQW$ 0  Comment - - pt denies-but dtr stated pt had a fall in last mths pt denies falls-but dtr states pt has had a fall -  Number falls in past yr: 1 1 0 0 -  Injury with Fall? 0 0 0 0 -  Risk Factor Category  - - - - -  Risk for fall due to : History of fall(s);Impaired balance/gait Impaired mobility History of fall(s);Impaired balance/gait;Impaired mobility;Medication side effect History of fall(s);Impaired balance/gait;Impaired mobility;Medication side effect No Fall Risks  Risk for fall due to: Comment - - - - -  Follow up Falls evaluation completed;Education provided;Falls prevention discussed Falls  evaluation completed Falls evaluation completed;Falls prevention discussed Falls evaluation completed Falls evaluation completed     Vitals:   03/22/20 1250  BP: (!) 104/57  Pulse: 76  Resp: 17  Temp: 97.9 F (36.6 C)  TempSrc: Oral  Weight: 132 lb 9.6 oz (60.1 kg)  Height: $Remove'5\' 5"'eXLmTBN$  (1.651 m)   Body mass index is 22.07 kg/m.  Physical Exam  GENERAL APPEARANCE: Well nourished. In no acute distress. Normal body habitus SKIN:  Skin is warm and dry. There are no suspicious lesions or rash HEAD: Normal in size and contour. No evidence of trauma EYES: Lids open and close normally. No blepharitis, entropion or ectropion. PERRL. Conjunctivae are clear and sclerae are white. Lenses are without opacity EARS: Pinnae are normal. Patient hears normal voice tunes of the examiner MOUTH and THROAT: Lips are without lesions. Oral mucosa is moist and without lesions.  Tongue is normal in shape, size, and color and without lesions NECK: supple, trachea midline, no neck masses, no thyroid tenderness, no thyromegaly LYMPHATICS: No LAN in the neck, no supraclavicular LAN RESPIRATORY: Breathing is even & unlabored, BS CTAB CARDIAC: RRR, no murmur,no extra heart sounds, no edema GI: Abdomen soft, normal BS, no masses, no tenderness, no hepatomegaly, no splenomegaly MUSCULOSKELETAL: No deformities. Movement at each extremity is full and painless. Strength is 5/5 at each extremity. Back is without kyphosis or scoliosis CIRCULATION: Pedal pulses are 2+. There is no edema of the legs, ankles and feet NEUROLOGICAL: There is no tremor. Speech is clear PSYCHIATRIC: Alert and oriented X 3. Affect and behavior are appropriate  Labs reviewed: Recent Labs    07/20/19 0301 07/21/19 0356 08/23/19 1701 09/20/19 0000 01/04/20 1222 01/13/20 0000 02/08/20 1827 02/13/20 0000 02/15/20 0000  NA 137   < > 143   < > 139   < > 139 134* 136*  K 3.6   < > 4.4   < > 4.5   < > 4.5 4.4 4.1  CL 98   < > 102   < > 107   < >  104 97* 101  CO2 27   < > 25   < >  --    < > $R'23 22 22  'pF$ GLUCOSE 239*   < > 121*  --  109*  --  81  --   --   BUN 26*   < > 44*   < > 56*   < > 115* 106* 101*  CREATININE 1.56*   < > 1.62*   < > 2.80*   < > 2.17* 1.9* 1.6*  CALCIUM 8.6*   < > 9.4   < >  --    < > 9.3 9.0 8.7  MG 1.8  --   --   --   --   --   --   --   --    < > = values in this interval not displayed.   Recent Labs    07/17/19 2150 11/25/19 0000 12/09/19 0000 02/08/20 1827 02/13/20 0000  AST 17 14  --  34 51*  ALT 13 15  --  43 49*  ALKPHOS 141* 140* 149* 161* 222*  BILITOT 0.8  --   --  0.6  --   PROT 7.6  --   --  7.0  --   ALBUMIN 3.4* 2.9*  --  2.4* 2.9*   Recent Labs    07/19/19 0219 07/20/19 0723 08/02/19 0000 01/26/20 0000 01/30/20 0000 02/08/20 1827  WBC 16.1* 9.2   < > 9.2 8.0 9.4  NEUTROABS  --   --    < > 5.60 4.50 5.6  HGB 10.3* 10.3*   < > 11.1* 11.5* 13.2  HCT 32.2* 32.4*   < > 34* 35* 40.8  MCV 93.9 93.9  --   --   --  92.9  PLT 184 193   < > 190 197 340   < > = values in this interval not displayed.   Lab Results  Component Value Date   TSH 4.030 02/08/2020   Lab Results  Component Value Date   HGBA1C 6.3 (H) 01/04/2020   Lab Results  Component Value Date   CHOL 99 02/07/2019   HDL 26 (L) 02/07/2019   LDLCALC 50 02/07/2019   TRIG 117 02/07/2019   CHOLHDL 3.8 02/07/2019    Significant Diagnostic Results in last 30  days:  VAS [place] ABI WITH/WO TBI  Result Date: 02/28/2020 LOWER EXTREMITY DOPPLER STUDY Indications: Ulceration, and peripheral artery disease. High Risk Factors: Hypertension, hyperlipidemia, past history of smoking, prior                    CVA. CABG Other Factors: Marland Kitchen  Vascular Interventions: Angioplasty right anterior tibial artery 01/04/2020. Limitations: Today's exam was limited due to Exam performed in WC. Performing Technologist: Alvia Grove RVT  Examination Guidelines: A complete evaluation includes at minimum, Doppler waveform signals and systolic blood pressure  reading at the level of bilateral brachial, anterior tibial, and posterior tibial arteries, when vessel segments are accessible. Bilateral testing is considered an integral part of a complete examination. Photoelectric Plethysmograph (PPG) waveforms and toe systolic pressure readings are included as required and additional duplex testing as needed. Limited examinations for reoccurring indications may be performed as noted.  ABI Findings: +--------+------------------+-----+----------+--------+ Right   Rt Pressure (mmHg)IndexWaveform  Comment  +--------+------------------+-----+----------+--------+ [id number]                                       +--------+------------------+-----+----------+--------+ PTA     238               2.33 biphasic           +--------+------------------+-----+----------+--------+ DP      249               2.44 monophasicbrisk    +--------+------------------+-----+----------+--------+ +--------+------------------+-----+----------+-------+ Left    Lt Pressure (mmHg)IndexWaveform  Comment +--------+------------------+-----+----------+-------+ Brachial96                                       +--------+------------------+-----+----------+-------+ PTA     240               2.35 monophasic        +--------+------------------+-----+----------+-------+ DP      254               2.49 monophasicbrisk   +--------+------------------+-----+----------+-------+ +-------+-----------+------------+------------+------------+ ABI/TBIToday's ABIToday's TBI Previous ABIPrevious TBI +-------+-----------+------------+------------+------------+ Right  [place]         not obtainedNC                       +-------+-----------+------------+------------+------------+ Left   [place]         not obtainedNC                       +-------+-----------+------------+------------+------------+  Summary: Right: Resting right ankle-brachial index indicates noncompressible  right lower extremity arteries. Left: Resting left ankle-brachial index indicates noncompressible left lower extremity arteries.  *See table(s) above for measurements and observations.  Electronically signed by Jamelle Haring on 02/28/2020 at 4:38:22 PM.   Final    ECHOCARDIOGRAM COMPLETE  Result Date: 03/14/2020    ECHOCARDIOGRAM REPORT   Patient Name:   YARITZEL ST[facility] Date of Exam: 03/14/2020 Medical Rec #:  [id number]        Height:       65.0 in Accession #:    [id number]       Weight:       137.0 lb Date of Birth:  11-12-1945        BSA:          1.684 m Patient Age:  74 years         BP:           105/58 mmHg Patient Gender: F                HR:           62 bpm. Exam Location:  [address] Procedure: 2D Echo, Cardiac Doppler and Color Doppler Indications:    Z95.2 s/p TAVR  History:        Patient has prior history of Echocardiogram examinations, most                 recent 07/18/2019. HOCM, CAD, Pacemaker and Prior CABG, PAD,                 Stroke and CKD; Risk Factors:Dyslipidemia, Hypertension, HLD and                 Diabetes. COVID 19 (2020).                 Aortic Valve: 23 mm Evolt Proplus valve is present in the aortic                 position. Procedure Date: 03/01/19.  Sonographer:    Marygrace Drought RCS Referring Phys: [id number] [place]  1. Small underfilled LV with mid/apical cavity obliteration in systole Mitral valve disease with MS likely contributes to low pre load and underfilling Appears to be a significant mid cavity gradient with velocity 2.9 m/sec at rest with HR 67 bpm This increases to velocity of 3.5 m/sec with valsalva . Left ventricular ejection fraction, by estimation, is 60 to 65%. The left ventricle has normal function. The left ventricle has no regional wall motion abnormalities. There is mild left ventricular hypertrophy. Left ventricular diastolic parameters are indeterminate.  2. Right ventricular systolic function is normal. The right ventricular size  is normal. There is normal pulmonary artery systolic pressure.  3. Left atrial size was mildly dilated.  4. The mitral valve is degenerative. No evidence of mitral valve regurgitation. Moderate mitral stenosis. Severe mitral annular calcification.  5. Post TAVR with 23 mm Medtronic Evolut valve No PVL mean gradient 7 peak 12 mmHg . The aortic valve has been repaired/replaced. Aortic valve regurgitation is not visualized. No aortic stenosis is present. There is a 23 mm Evolt Proplus valve present in the aortic position. Procedure Date: 03/01/19.  6. The inferior vena cava is normal in size with greater than 50% respiratory variability, suggesting right atrial pressure of 3 mmHg. FINDINGS  Left Ventricle: Small underfilled LV with mid/apical cavity obliteration in systole Mitral valve disease with MS likely contributes to low pre load and underfilling Appears to be a significant mid cavity gradient with velocity 2.9 m/sec at rest with HR 67 bpm This increases to velocity of 3.5 m/sec with valsalva. Left ventricular ejection fraction, by estimation, is 60 to 65%. The left ventricle has normal function. The left ventricle has no regional wall motion abnormalities. The left ventricular internal cavity size was normal in size. There is mild left ventricular hypertrophy. Left ventricular diastolic parameters are indeterminate. Right Ventricle: The right ventricular size is normal. No increase in right ventricular wall thickness. Right ventricular systolic function is normal. There is normal pulmonary artery systolic pressure. The tricuspid regurgitant velocity is 2.43 m/s, and  with an assumed right atrial pressure of 3 mmHg, the estimated right ventricular systolic pressure is 17.6 mmHg. Left Atrium: Left atrial size was mildly dilated.  Right Atrium: Right atrial size was normal in size. Pericardium: There is no evidence of pericardial effusion. Mitral Valve: The mitral valve is degenerative in appearance. There is  moderate thickening of the mitral valve leaflet(s). There is moderate calcification of the mitral valve leaflet(s). Severe mitral annular calcification. No evidence of mitral valve regurgitation. Moderate mitral valve stenosis. MV peak gradient, 8.8 mmHg. The mean mitral valve gradient is 3.0 mmHg. Tricuspid Valve: The tricuspid valve is normal in structure. Tricuspid valve regurgitation is not demonstrated. No evidence of tricuspid stenosis. Aortic Valve: Post TAVR with 23 mm Medtronic Evolut valve No PVL mean gradient 7 peak 12 mmHg. The aortic valve has been repaired/replaced. Aortic valve regurgitation is not visualized. No aortic stenosis is present. Aortic valve mean gradient measures 8.0 mmHg. Aortic valve peak gradient measures 14.7 mmHg. Aortic valve area, by VTI measures 1.64 cm. There is a 23 mm Evolt Proplus valve present in the aortic position. Procedure Date: 03/01/19. Pulmonic Valve: The pulmonic valve was normal in structure. Pulmonic valve regurgitation is not visualized. No evidence of pulmonic stenosis. Aorta: The aortic root is normal in size and structure. Venous: The inferior vena cava is normal in size with greater than 50% respiratory variability, suggesting right atrial pressure of 3 mmHg. IAS/Shunts: No atrial level shunt detected by color flow Doppler.  LEFT VENTRICLE PLAX 2D LVIDd:         2.40 cm  Diastology LVIDs:         1.70 cm  LV e' medial:    2.61 cm/s LV PW:         0.80 cm  LV E/e' medial:  28.7 LV IVS:        1.10 cm  LV e' lateral:   7.18 cm/s LVOT diam:     1.80 cm  LV E/e' lateral: 10.4 LV SV:         55 LV SV Index:   33 LVOT Area:     2.54 cm  RIGHT VENTRICLE RV Basal diam:  2.70 cm RV S prime:     6.53 cm/s TAPSE (M-mode): 1.0 cm RVSP:           26.6 mmHg LEFT ATRIUM             Index       RIGHT ATRIUM           Index LA diam:        4.10 cm 2.43 cm/m  RA Pressure: 3.00 mmHg LA Vol (A2C):   58.7 ml 34.85 ml/m RA Area:     10.40 cm LA Vol (A4C):   32.4 ml 19.24 ml/m  RA Volume:   20.10 ml  11.93 ml/m LA Biplane Vol: 46.2 ml 27.43 ml/m  AORTIC VALVE AV Area (Vmax):    1.74 cm AV Area (Vmean):   1.93 cm AV Area (VTI):     1.64 cm AV Vmax:           192.00 cm/s AV Vmean:          126.000 cm/s AV VTI:            0.339 m AV Peak Grad:      14.7 mmHg AV Mean Grad:      8.0 mmHg LVOT Vmax:         131.00 cm/s LVOT Vmean:        95.400 cm/s LVOT VTI:          0.218 m LVOT/AV VTI ratio: 0.64  AORTA Ao Root diam: 2.60 cm MITRAL VALVE                TRICUSPID VALVE MV Area (PHT): 1.75 cm     TR Peak grad:   23.6 mmHg MV Area VTI:   1.08 cm     TR Vmax:        243.00 cm/s MV Peak grad:  8.8 mmHg     Estimated RAP:  3.00 mmHg MV Mean grad:  3.0 mmHg     RVSP:           26.6 mmHg MV Vmax:       1.48 m/s MV Vmean:      80.7 cm/s    SHUNTS MV Decel Time: 434 msec     Systemic VTI:  0.22 m MV E velocity: 75.00 cm/s   Systemic Diam: 1.80 cm MV A velocity: 133.00 cm/s MV E/A ratio:  0.56 Jenkins Rouge MD Electronically signed by Jenkins Rouge MD Signature Date/Time: 03/14/2020/4:53:10 PM    Final     Assessment/Plan ***   Family/ staff Communication: ***  Labs/tests ordered:  ***  Goals of care:   Long-term care.   Durenda Age, DNP, MSN, FNP-BC [facility] [phone] (Monday-Friday 8:00 a.m. - 5:00 p.m.) [phone] (after hours)

## 2020-03-22 NOTE — Progress Notes (Signed)
Location:  [place] Room Number: 217-A Place of Service:  SNF (31) Provider:  Durenda Age, DNP, FNP-BC  Patient Care Team: Hendricks Limes, MD as PCP - General (Internal Medicine) Dorothy Spark, MD as PCP - Cardiology (Cardiology) Constance Haw, MD as PCP - Electrophysiology (Cardiology) Medina-Vargas, Senaida Lange, NP as Nurse Practitioner (Internal Medicine) Rehab, [facility] ([place])  Extended Emergency Contact Information Primary Emergency Contact: Lieutenant Diego [place] of [place] Phone: [phone] Mobile Phone: [phone] Relation: Daughter Secondary Emergency Contact: Armstrong,[name]  [place] Phone: [phone] Relation: Daughter  Code Status:  FULL CODE  Goals of care: Advanced Directive information Advanced Directives 03/16/2020  Does Patient Have a Medical Advance Directive? No  Type of Advance Directive -  Does patient want to make changes to medical advance directive? -  Copy of [place] in Chart? -  Would patient like information on creating a medical advance directive? No - Patient declined  Pre-existing out of facility DNR order (yellow form or pink MOST form) -     Chief Complaint  Patient presents with  . Acute Visit    Patient is seen for poor urine output    HPI:  Pt is a 75 y.o. female seen today for poor urine output.  She is a long-term care resident of [facility].  She has a PMH of chronic kidney disease, CAD, severe aortic stenosis, history of breast cancer, essential hypertension, dyslipidemia, stroke and chronic diastolic congestive heart failure. She stated that she had not urinated since morning. BMP done today showed -   Glucose 167, creatinine 1.49, BUN 73.9, Na 132, K 5.3, GFR 39.08. She has poor appetite and has refused breakfast.   Past Medical History:  Diagnosis Date  . Acute on  chronic diastolic CHF (congestive heart failure) ([place]) 06/22/2017  . Acute on chronic renal failure ([place]) 05/25/2011  . Adenocarcinoma of breast ([place]) 1996   Completed tamoxifen and had mastectomy.  . Aortic stenosis, severe    s/p aortic valve replacement with porcine valve 06/2004.  ECHO 2010 EF 09%, LVH, diastolic dysfxn, Bioprostetic aoritc valve, mild AS. ECHO 2013 EF 60%, Nl aortic artificial valve, dynamic obstruction in the outflow tract   Class IIb rec for annual TTE after 5 yrs. She had a TTE 2013    . Arthritis   . CAD (coronary artery disease) 2006   s/p CABG (5/06) w/ saphenous vein to RCA at time of AVR  . CKD (chronic kidney disease) stage 2, GFR 60-89 ml/min 06/03/2011   She is no longer taking NSAIDs.   Marland Kitchen CVA (cerebral infarction) 2006   Post-op from AVR. Presumed embolic in nature. Carotid stenosis of R 60-79%. Repeat dopplers 4/10 no R stenosis and L stenosis of 1-29%.  . Depression    Controlled on Paxil  . Diabetes mellitus 1992   Dx 04/25/1990. Now insulin dependent, started 2008. On ACEI.   . Diverticulosis 2001  . History of 2019 novel coronavirus disease (COVID-19)   . Hyperlipidemia    Mgmt with a statin  . Hypertension    Requires 4 drug tx  . Osteoporosis 2006   DEXA 10/06 : L femur T -2.8, R -2.7. Lumbar T -2.4. On bisphosphonates and  Calcium / Vit D.  . Peripheral vascular disease ([place])   . Presence of permanent cardiac pacemaker   . Refusal of blood transfusions as patient is Jehovah's Witness   .  Stroke [facility])    Past Surgical History:  Procedure Laterality Date  . ABDOMINAL HYSTERECTOMY  1987   for fibroids  . AORTIC VALVE REPLACEMENT  2006  . CHOLECYSTECTOMY    . CORONARY ARTERY BYPASS GRAFT  2006   Saphenous vein to RCA at time of AVR. Course complicated by acute respiratory failure, post-op PTX, ARI, ileus, CVA  . LOWER EXTREMITY ANGIOGRAPHY N/A 01/04/2020   Procedure: LOWER EXTREMITY ANGIOGRAPHY - Right;  Surgeon: Cherre Robins, MD;  Location:  [place] CV LAB;  Service: Cardiovascular;  Laterality: N/A;  . MASTECTOMY Left 1995   L for adenocarcinoma  . PACEMAKER IMPLANT N/A 02/09/2019   Procedure: PACEMAKER IMPLANT;  Surgeon: Constance Haw, MD;  Location: [place] CV LAB;  Service: Cardiovascular;  Laterality: N/A;  . PERIPHERAL VASCULAR BALLOON ANGIOPLASTY  01/04/2020   Procedure: PERIPHERAL VASCULAR BALLOON ANGIOPLASTY;  Surgeon: Cherre Robins, MD;  Location: [place] CV LAB;  Service: Cardiovascular;;  Rt AT  . RIGHT HEART CATH AND CORONARY/GRAFT ANGIOGRAPHY N/A 02/09/2019   Procedure: RIGHT HEART CATH AND CORONARY/GRAFT ANGIOGRAPHY;  Surgeon: Sherren Mocha, MD;  Location: [place] CV LAB;  Service: Cardiovascular;  Laterality: N/A;  . TEE WITHOUT CARDIOVERSION N/A 07/20/2019   Procedure: TRANSESOPHAGEAL ECHOCARDIOGRAM (TEE);  Surgeon: Jerline Pain, MD;  Location: [facility] ENDOSCOPY;  Service: Cardiovascular;  Laterality: N/A;  . TRANSCATHETER AORTIC VALVE REPLACEMENT, TRANSFEMORAL N/A 03/01/2019   Procedure: TRANSCATHETER AORTIC VALVE REPLACEMENT, TRANSFEMORAL;  Surgeon: Sherren Mocha, MD;  Location: [place] CV LAB;  Service: Open Heart Surgery;  Laterality: N/A;    Allergies  Allergen Reactions  . 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," as the patient is a Jehovah's Witness    Outpatient Encounter Medications as of 03/22/2020  Medication Sig  . amoxicillin (AMOXIL) 500 MG capsule Take 2,000 mg by mouth as needed. Prior to dental procedure  . doxycycline (VIBRAMYCIN) 100 MG capsule Take 100 mg by mouth 2 (two) times daily.  Marland Kitchen saccharomyces boulardii (FLORASTOR) 250 MG capsule Take 250 mg by mouth 2 (two) times daily.  Marland Kitchen acetaminophen (TYLENOL) 325 MG tablet Take 650 mg by mouth every 6 (six) hours as needed. Notify MD id not relieved (Not to exceed 3000 mg ) in 24 hour per stranding order  . acetaminophen (TYLENOL) 325 MG tablet Take 650 mg by mouth in the morning.  . Amino Acids-Protein  Hydrolys (FEEDING SUPPLEMENT, PRO-STAT SUGAR FREE 64,) LIQD Take 30 mLs by mouth in the morning and at bedtime.  Marland Kitchen amLODipine (NORVASC) 5 MG tablet Take 1 tablet (5 mg total) by mouth daily.  Marland Kitchen aspirin 81 MG EC tablet TAKE 1 TABLET (81 MG TOTAL) BY MOUTH DAILY. SWALLOW WHOLE.  Marland Kitchen atorvastatin (LIPITOR) 80 MG tablet Take 1 tablet (80 mg total) by mouth at bedtime.  . clopidogrel (PLAVIX) 75 MG tablet Take 1 tablet (75 mg total) by mouth daily with breakfast.  . diclofenac Sodium (VOLTAREN) 1 % GEL Apply 4 g topically 4 (four) times daily as needed (Pain). Apply to bilateral hips and knees  . DULoxetine (CYMBALTA) 30 MG capsule Take 30 mg by mouth daily.  . Ensure (ENSURE) Take 237 mLs by mouth in the morning and at bedtime. Strawberry if available  . gabapentin (NEURONTIN) 100 MG capsule Take 100 mg by mouth at bedtime.  . hydrALAZINE (APRESOLINE) 50 MG tablet Take 1 tablet (50 mg total) by mouth 3 (three) times daily.  . Insulin Degludec-Liraglutide (XULTOPHY) 100-3.6 UNIT-MG/ML SOPN Inject  14 Units into the skin daily.  Marland Kitchen lactulose (CHRONULAC) 10 GM/15ML solution Take 30 g by mouth 2 (two) times daily.  . metoprolol succinate (TOPROL-XL) 25 MG 24 hr tablet Take 1 tablet (25 mg total) by mouth daily.  . Multiple Vitamins-Minerals (DECUBI-VITE) CAPS Take 1 capsule by mouth daily.  . potassium chloride SA (KLOR-CON) 20 MEQ tablet Take 20 mEq by mouth 2 (two) times daily.   No facility-administered encounter medications on file as of 03/22/2020.    Review of Systems  GENERAL: Has poor appetite MOUTH and THROAT: Denies oral discomfort, gingival pain or bleeding RESPIRATORY: no cough, SOB, DOE, wheezing, hemoptysis CARDIAC: No chest pain, edema or palpitations GI: No abdominal pain, diarrhea, constipation, heart burn, nausea or vomiting GU: has not urinated since morning NEUROLOGICAL: Denies dizziness, syncope, numbness, or headache PSYCHIATRIC: Denies feelings of depression or anxiety. No  report of hallucinations, insomnia, paranoia, or agitation   Immunization History  Administered Date(s) Administered  . Fluad Quad(high Dose 65+) 02/11/2019  . Influenza Split 11/12/2010, 11/10/2011  . Influenza Whole 11/07/2009  . Influenza,inj,Quad PF,6+ Mos 10/19/2012, 01/05/2014, 11/08/2015, 01/29/2017  . Influenza-Unspecified 12/15/2019  . Moderna Sars-Covid-2 Vaccination 03/09/2019, 04/06/2019  . Pneumococcal Conjugate-13 06/07/2014  . Pneumococcal Polysaccharide-23 12/26/1994, 03/05/2010  . Td 09/24/1993  . Tdap 11/12/2010, 07/12/2013   Pertinent  Health Maintenance Due  Topic Date Due  . OPHTHALMOLOGY EXAM  11/07/2016  . FOOT EXAM  07/10/2017  . LIPID PANEL  02/07/2020  . HEMOGLOBIN A1C  04/05/2020  . INFLUENZA VACCINE  Completed  . DEXA SCAN  Completed  . PNA vac Low Risk Adult  Completed   Fall Risk  01/10/2020 08/02/2019 06/16/2019 05/19/2019 05/04/2019  Falls in the past year? 1 1 1 1  0  Comment - - pt denies-but dtr stated pt had a fall in last mths pt denies falls-but dtr states pt has had a fall -  Number falls in past yr: 1 1 0 0 -  Injury with Fall? 0 0 0 0 -  Risk Factor Category  - - - - -  Risk for fall due to : History of fall(s);Impaired balance/gait Impaired mobility History of fall(s);Impaired balance/gait;Impaired mobility;Medication side effect History of fall(s);Impaired balance/gait;Impaired mobility;Medication side effect No Fall Risks  Risk for fall due to: Comment - - - - -  Follow up Falls evaluation completed;Education provided;Falls prevention discussed Falls evaluation completed Falls evaluation completed;Falls prevention discussed Falls evaluation completed Falls evaluation completed     Vitals:   03/22/20 1250  BP: (!) 104/57  Pulse: 76  Resp: 17  Temp: 97.9 F (36.6 C)  TempSrc: Oral  Weight: 132 lb 9.6 oz (60.1 kg)  Height: 5\' 5"  (1.651 m)   Body mass index is 22.07 kg/m.  Physical Exam  GENERAL APPEARANCE:  In no acute distress.  Normal body habitus SKIN:  Right foot with dressing MOUTH and THROAT: Lips are without lesions. Oral mucosa is moist and without lesions.  RESPIRATORY: Breathing is even & unlabored, BS CTAB CARDIAC: RRR, no murmur,no extra heart sounds, no edema GI: Abdomen soft, normal BS, no masses, no tenderness NEUROLOGICAL: There is no tremor. Speech is clear PSYCHIATRIC:  Affect and behavior are appropriate  Labs reviewed: Recent Labs    07/20/19 0301 07/21/19 0356 08/23/19 1701 09/20/19 0000 01/04/20 1222 01/13/20 0000 02/08/20 1827 02/13/20 0000 02/15/20 0000  NA 137   < > 143   < > 139   < > 139 134* 136*  K 3.6   < >  4.4   < > 4.5   < > 4.5 4.4 4.1  CL 98   < > 102   < > 107   < > 104 97* 101  CO2 27   < > 25   < >  --    < > $R'23 22 22  'HH$ GLUCOSE 239*   < > 121*  --  109*  --  81  --   --   BUN 26*   < > 44*   < > 56*   < > 115* 106* 101*  CREATININE 1.56*   < > 1.62*   < > 2.80*   < > 2.17* 1.9* 1.6*  CALCIUM 8.6*   < > 9.4   < >  --    < > 9.3 9.0 8.7  MG 1.8  --   --   --   --   --   --   --   --    < > = values in this interval not displayed.   Recent Labs    07/17/19 2150 11/25/19 0000 12/09/19 0000 02/08/20 1827 02/13/20 0000  AST 17 14  --  34 51*  ALT 13 15  --  43 49*  ALKPHOS 141* 140* 149* 161* 222*  BILITOT 0.8  --   --  0.6  --   PROT 7.6  --   --  7.0  --   ALBUMIN 3.4* 2.9*  --  2.4* 2.9*   Recent Labs    07/19/19 0219 07/20/19 0723 08/02/19 0000 01/26/20 0000 01/30/20 0000 02/08/20 1827  WBC 16.1* 9.2   < > 9.2 8.0 9.4  NEUTROABS  --   --    < > 5.60 4.50 5.6  HGB 10.3* 10.3*   < > 11.1* 11.5* 13.2  HCT 32.2* 32.4*   < > 34* 35* 40.8  MCV 93.9 93.9  --   --   --  92.9  PLT 184 193   < > 190 197 340   < > = values in this interval not displayed.   Lab Results  Component Value Date   TSH 4.030 02/08/2020   Lab Results  Component Value Date   HGBA1C 6.3 (H) 01/04/2020   Lab Results  Component Value Date   CHOL 99 02/07/2019   HDL 26 (L)  02/07/2019   LDLCALC 50 02/07/2019   TRIG 117 02/07/2019   CHOLHDL 3.8 02/07/2019    Significant Diagnostic Results in last 30 days:  VAS [place] ABI WITH/WO TBI  Result Date: 02/28/2020 LOWER EXTREMITY DOPPLER STUDY Indications: Ulceration, and peripheral artery disease. High Risk Factors: Hypertension, hyperlipidemia, past history of smoking, prior                    CVA. CABG Other Factors: Marland Kitchen  Vascular Interventions: Angioplasty right anterior tibial artery 01/04/2020. Limitations: Today's exam was limited due to Exam performed in WC. Performing Technologist: Alvia Grove RVT  Examination Guidelines: A complete evaluation includes at minimum, Doppler waveform signals and systolic blood pressure reading at the level of bilateral brachial, anterior tibial, and posterior tibial arteries, when vessel segments are accessible. Bilateral testing is considered an integral part of a complete examination. Photoelectric Plethysmograph (PPG) waveforms and toe systolic pressure readings are included as required and additional duplex testing as needed. Limited examinations for reoccurring indications may be performed as noted.  ABI Findings: +--------+------------------+-----+----------+--------+ Right   Rt Pressure (mmHg)IndexWaveform  Comment  +--------+------------------+-----+----------+--------+ Brachial102                                       +--------+------------------+-----+----------+--------+  PTA     238               2.33 biphasic           +--------+------------------+-----+----------+--------+ DP      249               2.44 monophasicbrisk    +--------+------------------+-----+----------+--------+ +--------+------------------+-----+----------+-------+ Left    Lt Pressure (mmHg)IndexWaveform  Comment +--------+------------------+-----+----------+-------+ Brachial96                                       +--------+------------------+-----+----------+-------+ PTA     240                2.35 monophasic        +--------+------------------+-----+----------+-------+ DP      254               2.49 monophasicbrisk   +--------+------------------+-----+----------+-------+ +-------+-----------+------------+------------+------------+ ABI/TBIToday's ABIToday's TBI Previous ABIPrevious TBI +-------+-----------+------------+------------+------------+ Right  [place]         not obtainedNC                       +-------+-----------+------------+------------+------------+ Left            not obtainedNC                       +-------+-----------+------------+------------+------------+  Summary: Right: Resting right ankle-brachial index indicates noncompressible right lower extremity arteries. Left: Resting left ankle-brachial index indicates noncompressible left lower extremity arteries.  *See table(s) above for measurements and observations.  Electronically signed by Jamelle Haring on 02/28/2020 at 4:38:22 PM.   Final    ECHOCARDIOGRAM COMPLETE  Result Date: 03/14/2020    ECHOCARDIOGRAM REPORT   Patient Name:   MILLER EDGINGTON [facility] Date of Exam: 03/14/2020 Medical Rec #:  [id number]        Height:       65.0 in Accession #:    [id number]       Weight:       137.0 lb Date of Birth:  10/14/1945        BSA:          1.684 m Patient Age:    89 years         BP:           105/58 mmHg Patient Gender: F                HR:           62 bpm. Exam Location:  [place] Procedure: 2D Echo, Cardiac Doppler and Color Doppler Indications:    Z95.2 s/p TAVR  History:        Patient has prior history of Echocardiogram examinations, most                 recent 07/18/2019. HOCM, CAD, Pacemaker and Prior CABG, PAD,                 Stroke and CKD; Risk Factors:Dyslipidemia, Hypertension, HLD and                 Diabetes. COVID 19 (2020).                 Aortic Valve: 23 mm Evolt Proplus valve is present in the aortic                 position. Procedure Date:  03/01/19.  Sonographer:    Marygrace Drought RCS  Referring Phys: [id number] [place]  1. Small underfilled LV with mid/apical cavity obliteration in systole Mitral valve disease with MS likely contributes to low pre load and underfilling Appears to be a significant mid cavity gradient with velocity 2.9 m/sec at rest with HR 67 bpm This increases to velocity of 3.5 m/sec with valsalva . Left ventricular ejection fraction, by estimation, is 60 to 65%. The left ventricle has normal function. The left ventricle has no regional wall motion abnormalities. There is mild left ventricular hypertrophy. Left ventricular diastolic parameters are indeterminate.  2. Right ventricular systolic function is normal. The right ventricular size is normal. There is normal pulmonary artery systolic pressure.  3. Left atrial size was mildly dilated.  4. The mitral valve is degenerative. No evidence of mitral valve regurgitation. Moderate mitral stenosis. Severe mitral annular calcification.  5. Post TAVR with 23 mm Medtronic Evolut valve No PVL mean gradient 7 peak 12 mmHg . The aortic valve has been repaired/replaced. Aortic valve regurgitation is not visualized. No aortic stenosis is present. There is a 23 mm Evolt Proplus valve present in the aortic position. Procedure Date: 03/01/19.  6. The inferior vena cava is normal in size with greater than 50% respiratory variability, suggesting right atrial pressure of 3 mmHg. FINDINGS  Left Ventricle: Small underfilled LV with mid/apical cavity obliteration in systole Mitral valve disease with MS likely contributes to low pre load and underfilling Appears to be a significant mid cavity gradient with velocity 2.9 m/sec at rest with HR 67 bpm This increases to velocity of 3.5 m/sec with valsalva. Left ventricular ejection fraction, by estimation, is 60 to 65%. The left ventricle has normal function. The left ventricle has no regional wall motion abnormalities. The left ventricular internal cavity size was normal in size.  There is mild left ventricular hypertrophy. Left ventricular diastolic parameters are indeterminate. Right Ventricle: The right ventricular size is normal. No increase in right ventricular wall thickness. Right ventricular systolic function is normal. There is normal pulmonary artery systolic pressure. The tricuspid regurgitant velocity is 2.43 m/s, and  with an assumed right atrial pressure of 3 mmHg, the estimated right ventricular systolic pressure is 81.1 mmHg. Left Atrium: Left atrial size was mildly dilated. Right Atrium: Right atrial size was normal in size. Pericardium: There is no evidence of pericardial effusion. Mitral Valve: The mitral valve is degenerative in appearance. There is moderate thickening of the mitral valve leaflet(s). There is moderate calcification of the mitral valve leaflet(s). Severe mitral annular calcification. No evidence of mitral valve regurgitation. Moderate mitral valve stenosis. MV peak gradient, 8.8 mmHg. The mean mitral valve gradient is 3.0 mmHg. Tricuspid Valve: The tricuspid valve is normal in structure. Tricuspid valve regurgitation is not demonstrated. No evidence of tricuspid stenosis. Aortic Valve: Post TAVR with 23 mm Medtronic Evolut valve No PVL mean gradient 7 peak 12 mmHg. The aortic valve has been repaired/replaced. Aortic valve regurgitation is not visualized. No aortic stenosis is present. Aortic valve mean gradient measures 8.0 mmHg. Aortic valve peak gradient measures 14.7 mmHg. Aortic valve area, by VTI measures 1.64 cm. There is a 23 mm Evolt Proplus valve present in the aortic position. Procedure Date: 03/01/19. Pulmonic Valve: The pulmonic valve was normal in structure. Pulmonic valve regurgitation is not visualized. No evidence of pulmonic stenosis. Aorta: The aortic root is normal in size and structure. Venous: The inferior vena cava is normal in size with greater than  50% respiratory variability, suggesting right atrial pressure of 3 mmHg. IAS/Shunts:  No atrial level shunt detected by color flow Doppler.  LEFT VENTRICLE PLAX 2D LVIDd:         2.40 cm  Diastology LVIDs:         1.70 cm  LV e' medial:    2.61 cm/s LV PW:         0.80 cm  LV E/e' medial:  28.7 LV IVS:        1.10 cm  LV e' lateral:   7.18 cm/s LVOT diam:     1.80 cm  LV E/e' lateral: 10.4 LV SV:         55 LV SV Index:   33 LVOT Area:     2.54 cm  RIGHT VENTRICLE RV Basal diam:  2.70 cm RV S prime:     6.53 cm/s TAPSE (M-mode): 1.0 cm RVSP:           26.6 mmHg LEFT ATRIUM             Index       RIGHT ATRIUM           Index LA diam:        4.10 cm 2.43 cm/m  RA Pressure: 3.00 mmHg LA Vol (A2C):   58.7 ml 34.85 ml/m RA Area:     10.40 cm LA Vol (A4C):   32.4 ml 19.24 ml/m RA Volume:   20.10 ml  11.93 ml/m LA Biplane Vol: 46.2 ml 27.43 ml/m  AORTIC VALVE AV Area (Vmax):    1.74 cm AV Area (Vmean):   1.93 cm AV Area (VTI):     1.64 cm AV Vmax:           192.00 cm/s AV Vmean:          126.000 cm/s AV VTI:            0.339 m AV Peak Grad:      14.7 mmHg AV Mean Grad:      8.0 mmHg LVOT Vmax:         131.00 cm/s LVOT Vmean:        95.400 cm/s LVOT VTI:          0.218 m LVOT/AV VTI ratio: 0.64  AORTA Ao Root diam: 2.60 cm MITRAL VALVE                TRICUSPID VALVE MV Area (PHT): 1.75 cm     TR Peak grad:   23.6 mmHg MV Area VTI:   1.08 cm     TR Vmax:        243.00 cm/s MV Peak grad:  8.8 mmHg     Estimated RAP:  3.00 mmHg MV Mean grad:  3.0 mmHg     RVSP:           26.6 mmHg MV Vmax:       1.48 m/s MV Vmean:      80.7 cm/s    SHUNTS MV Decel Time: 434 msec     Systemic VTI:  0.22 m MV E velocity: 75.00 cm/s   Systemic Diam: 1.80 cm MV A velocity: 133.00 cm/s MV E/A ratio:  0.56 Jenkins Rouge MD Electronically signed by Jenkins Rouge MD Signature Date/Time: 03/14/2020/4:53:10 PM    Final     Assessment/Plan  1. AKI (acute kidney injury) ([place]) -  Creatinine 1.49, BUN 73.9, GFR 39.08, has poor urine output and poor appetite -  Will start  IVF 0.9 NS @ 70 ml/hour X 1L  2. Hyperkalemia -  K  5.3, will discontinue KCL ER -  No longer taking Lasix    Family/ staff Communication: Discussed plan of care with resident and charge nurse.  Labs/tests ordered:  Repeat BMP after IVF  Goals of care:   Long-term care   Durenda Age, DNP, MSN, FNP-BC [facility] [phone] (Monday-Friday 8:00 a.m. - 5:00 p.m.) [phone] (after hours)

## 2020-03-23 DIAGNOSIS — R279 Unspecified lack of coordination: Secondary | ICD-10-CM | POA: Diagnosis not present

## 2020-03-23 DIAGNOSIS — M6281 Muscle weakness (generalized): Secondary | ICD-10-CM | POA: Diagnosis not present

## 2020-03-23 DIAGNOSIS — I69354 Hemiplegia and hemiparesis following cerebral infarction affecting left non-dominant side: Secondary | ICD-10-CM | POA: Diagnosis not present

## 2020-03-23 DIAGNOSIS — D649 Anemia, unspecified: Secondary | ICD-10-CM | POA: Diagnosis not present

## 2020-03-23 LAB — AEROBIC CULTURE W GRAM STAIN (SUPERFICIAL SPECIMEN): Gram Stain: NONE SEEN

## 2020-03-26 DIAGNOSIS — I69354 Hemiplegia and hemiparesis following cerebral infarction affecting left non-dominant side: Secondary | ICD-10-CM | POA: Diagnosis not present

## 2020-03-26 DIAGNOSIS — R279 Unspecified lack of coordination: Secondary | ICD-10-CM | POA: Diagnosis not present

## 2020-03-26 DIAGNOSIS — M6281 Muscle weakness (generalized): Secondary | ICD-10-CM | POA: Diagnosis not present

## 2020-03-27 DIAGNOSIS — M6281 Muscle weakness (generalized): Secondary | ICD-10-CM | POA: Diagnosis not present

## 2020-03-27 DIAGNOSIS — R279 Unspecified lack of coordination: Secondary | ICD-10-CM | POA: Diagnosis not present

## 2020-03-29 ENCOUNTER — Ambulatory Visit: Payer: Medicare Other | Admitting: Sports Medicine

## 2020-04-02 DIAGNOSIS — M6281 Muscle weakness (generalized): Secondary | ICD-10-CM | POA: Diagnosis not present

## 2020-04-02 DIAGNOSIS — R279 Unspecified lack of coordination: Secondary | ICD-10-CM | POA: Diagnosis not present

## 2020-04-03 ENCOUNTER — Inpatient Hospital Stay (HOSPITAL_COMMUNITY)
Admission: EM | Admit: 2020-04-03 | Discharge: 2020-04-11 | DRG: 463 | Disposition: A | Discharge status: Skilled Nursing Facility | Payer: Medicare Other | Attending: Family Medicine | Admitting: Family Medicine

## 2020-04-03 ENCOUNTER — Encounter (HOSPITAL_COMMUNITY): Payer: Self-pay | Admitting: General Surgery

## 2020-04-03 ENCOUNTER — Emergency Department (HOSPITAL_COMMUNITY): Payer: Medicare Other

## 2020-04-03 ENCOUNTER — Inpatient Hospital Stay (HOSPITAL_COMMUNITY): Payer: Medicare Other

## 2020-04-03 ENCOUNTER — Telehealth: Payer: Self-pay | Admitting: Sports Medicine

## 2020-04-03 ENCOUNTER — Encounter (HOSPITAL_BASED_OUTPATIENT_CLINIC_OR_DEPARTMENT_OTHER): Payer: Medicare Other | Attending: Internal Medicine | Admitting: Internal Medicine

## 2020-04-03 ENCOUNTER — Other Ambulatory Visit: Payer: Self-pay

## 2020-04-03 DIAGNOSIS — F32A Depression, unspecified: Secondary | ICD-10-CM | POA: Diagnosis present

## 2020-04-03 DIAGNOSIS — Z7982 Long term (current) use of aspirin: Secondary | ICD-10-CM

## 2020-04-03 DIAGNOSIS — E871 Hypo-osmolality and hyponatremia: Secondary | ICD-10-CM | POA: Diagnosis present

## 2020-04-03 DIAGNOSIS — I10 Essential (primary) hypertension: Secondary | ICD-10-CM | POA: Diagnosis not present

## 2020-04-03 DIAGNOSIS — Z531 Procedure and treatment not carried out because of patient's decision for reasons of belief and group pressure: Secondary | ICD-10-CM

## 2020-04-03 DIAGNOSIS — M86671 Other chronic osteomyelitis, right ankle and foot: Principal | ICD-10-CM | POA: Diagnosis present

## 2020-04-03 DIAGNOSIS — Z950 Presence of cardiac pacemaker: Secondary | ICD-10-CM

## 2020-04-03 DIAGNOSIS — M1990 Unspecified osteoarthritis, unspecified site: Secondary | ICD-10-CM | POA: Diagnosis not present

## 2020-04-03 DIAGNOSIS — L89150 Pressure ulcer of sacral region, unstageable: Secondary | ICD-10-CM | POA: Diagnosis not present

## 2020-04-03 DIAGNOSIS — Z7401 Bed confinement status: Secondary | ICD-10-CM | POA: Diagnosis not present

## 2020-04-03 DIAGNOSIS — N179 Acute kidney failure, unspecified: Secondary | ICD-10-CM | POA: Diagnosis not present

## 2020-04-03 DIAGNOSIS — I2510 Atherosclerotic heart disease of native coronary artery without angina pectoris: Secondary | ICD-10-CM | POA: Diagnosis present

## 2020-04-03 DIAGNOSIS — I959 Hypotension, unspecified: Secondary | ICD-10-CM | POA: Diagnosis present

## 2020-04-03 DIAGNOSIS — Z20822 Contact with and (suspected) exposure to covid-19: Secondary | ICD-10-CM | POA: Diagnosis not present

## 2020-04-03 DIAGNOSIS — L89610 Pressure ulcer of right heel, unstageable: Secondary | ICD-10-CM | POA: Diagnosis not present

## 2020-04-03 DIAGNOSIS — F419 Anxiety disorder, unspecified: Secondary | ICD-10-CM | POA: Diagnosis present

## 2020-04-03 DIAGNOSIS — R531 Weakness: Secondary | ICD-10-CM | POA: Diagnosis not present

## 2020-04-03 DIAGNOSIS — M810 Age-related osteoporosis without current pathological fracture: Secondary | ICD-10-CM | POA: Diagnosis present

## 2020-04-03 DIAGNOSIS — N182 Chronic kidney disease, stage 2 (mild): Secondary | ICD-10-CM | POA: Diagnosis not present

## 2020-04-03 DIAGNOSIS — E876 Hypokalemia: Secondary | ICD-10-CM | POA: Diagnosis present

## 2020-04-03 DIAGNOSIS — R339 Retention of urine, unspecified: Secondary | ICD-10-CM | POA: Diagnosis present

## 2020-04-03 DIAGNOSIS — Z951 Presence of aortocoronary bypass graft: Secondary | ICD-10-CM

## 2020-04-03 DIAGNOSIS — M2011 Hallux valgus (acquired), right foot: Secondary | ICD-10-CM | POA: Diagnosis not present

## 2020-04-03 DIAGNOSIS — I708 Atherosclerosis of other arteries: Secondary | ICD-10-CM | POA: Diagnosis not present

## 2020-04-03 DIAGNOSIS — E119 Type 2 diabetes mellitus without complications: Secondary | ICD-10-CM

## 2020-04-03 DIAGNOSIS — F329 Major depressive disorder, single episode, unspecified: Secondary | ICD-10-CM | POA: Diagnosis not present

## 2020-04-03 DIAGNOSIS — L89154 Pressure ulcer of sacral region, stage 4: Secondary | ICD-10-CM | POA: Diagnosis not present

## 2020-04-03 DIAGNOSIS — E1122 Type 2 diabetes mellitus with diabetic chronic kidney disease: Secondary | ICD-10-CM | POA: Diagnosis present

## 2020-04-03 DIAGNOSIS — R41841 Cognitive communication deficit: Secondary | ICD-10-CM | POA: Diagnosis not present

## 2020-04-03 DIAGNOSIS — Z792 Long term (current) use of antibiotics: Secondary | ICD-10-CM

## 2020-04-03 DIAGNOSIS — Z452 Encounter for adjustment and management of vascular access device: Secondary | ICD-10-CM | POA: Diagnosis not present

## 2020-04-03 DIAGNOSIS — E1151 Type 2 diabetes mellitus with diabetic peripheral angiopathy without gangrene: Secondary | ICD-10-CM | POA: Diagnosis present

## 2020-04-03 DIAGNOSIS — N184 Chronic kidney disease, stage 4 (severe): Secondary | ICD-10-CM | POA: Diagnosis present

## 2020-04-03 DIAGNOSIS — I350 Nonrheumatic aortic (valve) stenosis: Secondary | ICD-10-CM

## 2020-04-03 DIAGNOSIS — E1140 Type 2 diabetes mellitus with diabetic neuropathy, unspecified: Secondary | ICD-10-CM | POA: Diagnosis present

## 2020-04-03 DIAGNOSIS — E785 Hyperlipidemia, unspecified: Secondary | ICD-10-CM | POA: Diagnosis not present

## 2020-04-03 DIAGNOSIS — G9341 Metabolic encephalopathy: Secondary | ICD-10-CM | POA: Diagnosis present

## 2020-04-03 DIAGNOSIS — I130 Hypertensive heart and chronic kidney disease with heart failure and stage 1 through stage 4 chronic kidney disease, or unspecified chronic kidney disease: Secondary | ICD-10-CM | POA: Diagnosis not present

## 2020-04-03 DIAGNOSIS — Z8249 Family history of ischemic heart disease and other diseases of the circulatory system: Secondary | ICD-10-CM

## 2020-04-03 DIAGNOSIS — M7989 Other specified soft tissue disorders: Secondary | ICD-10-CM | POA: Diagnosis not present

## 2020-04-03 DIAGNOSIS — I421 Obstructive hypertrophic cardiomyopathy: Secondary | ICD-10-CM | POA: Diagnosis present

## 2020-04-03 DIAGNOSIS — L89152 Pressure ulcer of sacral region, stage 2: Secondary | ICD-10-CM | POA: Diagnosis not present

## 2020-04-03 DIAGNOSIS — I5032 Chronic diastolic (congestive) heart failure: Secondary | ICD-10-CM | POA: Diagnosis present

## 2020-04-03 DIAGNOSIS — E11621 Type 2 diabetes mellitus with foot ulcer: Secondary | ICD-10-CM | POA: Diagnosis present

## 2020-04-03 DIAGNOSIS — E11649 Type 2 diabetes mellitus with hypoglycemia without coma: Secondary | ICD-10-CM | POA: Diagnosis present

## 2020-04-03 DIAGNOSIS — Z8616 Personal history of COVID-19: Secondary | ICD-10-CM

## 2020-04-03 DIAGNOSIS — S91309A Unspecified open wound, unspecified foot, initial encounter: Secondary | ICD-10-CM | POA: Diagnosis present

## 2020-04-03 DIAGNOSIS — Z8673 Personal history of transient ischemic attack (TIA), and cerebral infarction without residual deficits: Secondary | ICD-10-CM

## 2020-04-03 DIAGNOSIS — E1169 Type 2 diabetes mellitus with other specified complication: Secondary | ICD-10-CM | POA: Diagnosis present

## 2020-04-03 DIAGNOSIS — Z79899 Other long term (current) drug therapy: Secondary | ICD-10-CM

## 2020-04-03 DIAGNOSIS — Z9012 Acquired absence of left breast and nipple: Secondary | ICD-10-CM

## 2020-04-03 DIAGNOSIS — R278 Other lack of coordination: Secondary | ICD-10-CM | POA: Diagnosis not present

## 2020-04-03 DIAGNOSIS — M869 Osteomyelitis, unspecified: Secondary | ICD-10-CM | POA: Diagnosis not present

## 2020-04-03 DIAGNOSIS — Z993 Dependence on wheelchair: Secondary | ICD-10-CM

## 2020-04-03 DIAGNOSIS — IMO0001 Reserved for inherently not codable concepts without codable children: Secondary | ICD-10-CM

## 2020-04-03 DIAGNOSIS — N183 Chronic kidney disease, stage 3 unspecified: Secondary | ICD-10-CM | POA: Diagnosis not present

## 2020-04-03 DIAGNOSIS — M6281 Muscle weakness (generalized): Secondary | ICD-10-CM | POA: Diagnosis not present

## 2020-04-03 DIAGNOSIS — Z833 Family history of diabetes mellitus: Secondary | ICD-10-CM

## 2020-04-03 DIAGNOSIS — I442 Atrioventricular block, complete: Secondary | ICD-10-CM | POA: Diagnosis not present

## 2020-04-03 DIAGNOSIS — M868X7 Other osteomyelitis, ankle and foot: Secondary | ICD-10-CM | POA: Diagnosis not present

## 2020-04-03 DIAGNOSIS — N1832 Chronic kidney disease, stage 3b: Secondary | ICD-10-CM

## 2020-04-03 DIAGNOSIS — D649 Anemia, unspecified: Secondary | ICD-10-CM | POA: Diagnosis present

## 2020-04-03 DIAGNOSIS — Z818 Family history of other mental and behavioral disorders: Secondary | ICD-10-CM

## 2020-04-03 DIAGNOSIS — R2243 Localized swelling, mass and lump, lower limb, bilateral: Secondary | ICD-10-CM | POA: Diagnosis not present

## 2020-04-03 DIAGNOSIS — I700 Atherosclerosis of aorta: Secondary | ICD-10-CM | POA: Diagnosis present

## 2020-04-03 DIAGNOSIS — E872 Acidosis: Secondary | ICD-10-CM | POA: Diagnosis present

## 2020-04-03 DIAGNOSIS — F29 Unspecified psychosis not due to a substance or known physiological condition: Secondary | ICD-10-CM | POA: Diagnosis not present

## 2020-04-03 DIAGNOSIS — M6258 Muscle wasting and atrophy, not elsewhere classified, other site: Secondary | ICD-10-CM | POA: Diagnosis not present

## 2020-04-03 DIAGNOSIS — R5381 Other malaise: Secondary | ICD-10-CM | POA: Diagnosis not present

## 2020-04-03 DIAGNOSIS — Z952 Presence of prosthetic heart valve: Secondary | ICD-10-CM | POA: Diagnosis not present

## 2020-04-03 DIAGNOSIS — Z953 Presence of xenogenic heart valve: Secondary | ICD-10-CM

## 2020-04-03 DIAGNOSIS — Z853 Personal history of malignant neoplasm of breast: Secondary | ICD-10-CM

## 2020-04-03 DIAGNOSIS — L89619 Pressure ulcer of right heel, unspecified stage: Secondary | ICD-10-CM | POA: Diagnosis not present

## 2020-04-03 DIAGNOSIS — T148XXA Other injury of unspecified body region, initial encounter: Secondary | ICD-10-CM | POA: Diagnosis not present

## 2020-04-03 DIAGNOSIS — S91301A Unspecified open wound, right foot, initial encounter: Secondary | ICD-10-CM | POA: Diagnosis not present

## 2020-04-03 DIAGNOSIS — M533 Sacrococcygeal disorders, not elsewhere classified: Secondary | ICD-10-CM | POA: Diagnosis not present

## 2020-04-03 DIAGNOSIS — R1311 Dysphagia, oral phase: Secondary | ICD-10-CM | POA: Diagnosis not present

## 2020-04-03 DIAGNOSIS — L089 Local infection of the skin and subcutaneous tissue, unspecified: Secondary | ICD-10-CM | POA: Diagnosis not present

## 2020-04-03 DIAGNOSIS — L97412 Non-pressure chronic ulcer of right heel and midfoot with fat layer exposed: Secondary | ICD-10-CM | POA: Diagnosis not present

## 2020-04-03 DIAGNOSIS — Z794 Long term (current) use of insulin: Secondary | ICD-10-CM

## 2020-04-03 DIAGNOSIS — B964 Proteus (mirabilis) (morganii) as the cause of diseases classified elsewhere: Secondary | ICD-10-CM | POA: Diagnosis not present

## 2020-04-03 DIAGNOSIS — M2550 Pain in unspecified joint: Secondary | ICD-10-CM | POA: Diagnosis not present

## 2020-04-03 DIAGNOSIS — S31000A Unspecified open wound of lower back and pelvis without penetration into retroperitoneum, initial encounter: Secondary | ICD-10-CM | POA: Diagnosis not present

## 2020-04-03 DIAGNOSIS — L89159 Pressure ulcer of sacral region, unspecified stage: Secondary | ICD-10-CM | POA: Diagnosis not present

## 2020-04-03 DIAGNOSIS — M62262 Nontraumatic ischemic infarction of muscle, left lower leg: Secondary | ICD-10-CM | POA: Diagnosis not present

## 2020-04-03 DIAGNOSIS — Z820 Family history of epilepsy and other diseases of the nervous system: Secondary | ICD-10-CM

## 2020-04-03 DIAGNOSIS — M19071 Primary osteoarthritis, right ankle and foot: Secondary | ICD-10-CM | POA: Diagnosis not present

## 2020-04-03 DIAGNOSIS — Z841 Family history of disorders of kidney and ureter: Secondary | ICD-10-CM

## 2020-04-03 DIAGNOSIS — M47818 Spondylosis without myelopathy or radiculopathy, sacral and sacrococcygeal region: Secondary | ICD-10-CM | POA: Diagnosis not present

## 2020-04-03 DIAGNOSIS — M86171 Other acute osteomyelitis, right ankle and foot: Secondary | ICD-10-CM | POA: Diagnosis not present

## 2020-04-03 DIAGNOSIS — M160 Bilateral primary osteoarthritis of hip: Secondary | ICD-10-CM | POA: Diagnosis not present

## 2020-04-03 DIAGNOSIS — I639 Cerebral infarction, unspecified: Secondary | ICD-10-CM | POA: Diagnosis not present

## 2020-04-03 DIAGNOSIS — Z7902 Long term (current) use of antithrombotics/antiplatelets: Secondary | ICD-10-CM

## 2020-04-03 LAB — COMPREHENSIVE METABOLIC PANEL
ALT: 20 U/L (ref 0–44)
AST: 19 U/L (ref 15–41)
Albumin: 1.7 g/dL — ABNORMAL LOW (ref 3.5–5.0)
Alkaline Phosphatase: 142 U/L — ABNORMAL HIGH (ref 38–126)
Anion gap: 12 (ref 5–15)
BUN: 90 mg/dL — ABNORMAL HIGH (ref 8–23)
CO2: 21 mmol/L — ABNORMAL LOW (ref 22–32)
Calcium: 8.1 mg/dL — ABNORMAL LOW (ref 8.9–10.3)
Chloride: 99 mmol/L (ref 98–111)
Creatinine, Ser: 2.2 mg/dL — ABNORMAL HIGH (ref 0.44–1.00)
GFR, Estimated: 23 mL/min — ABNORMAL LOW (ref >60)
Glucose, Bld: 188 mg/dL — ABNORMAL HIGH (ref 70–99)
Potassium: 4.4 mmol/L (ref 3.5–5.1)
Sodium: 132 mmol/L — ABNORMAL LOW (ref 135–145)
Total Bilirubin: 0.9 mg/dL (ref 0.3–1.2)
Total Protein: 5.8 g/dL — ABNORMAL LOW (ref 6.5–8.1)

## 2020-04-03 LAB — CBC WITH DIFFERENTIAL/PLATELET
Abs Immature Granulocytes: 0.13 10*3/uL — ABNORMAL HIGH (ref 0.00–0.07)
Basophils Absolute: 0 10*3/uL (ref 0.0–0.1)
Basophils Relative: 0 %
Eosinophils Absolute: 0.2 10*3/uL (ref 0.0–0.5)
Eosinophils Relative: 1 %
HCT: 29.1 % — ABNORMAL LOW (ref 36.0–46.0)
Hemoglobin: 9.5 g/dL — ABNORMAL LOW (ref 12.0–15.0)
Immature Granulocytes: 1 %
Lymphocytes Relative: 11 %
Lymphs Abs: 2.1 10*3/uL (ref 0.7–4.0)
MCH: 30.9 pg (ref 26.0–34.0)
MCHC: 32.6 g/dL (ref 30.0–36.0)
MCV: 94.8 fL (ref 80.0–100.0)
Monocytes Absolute: 1.4 10*3/uL — ABNORMAL HIGH (ref 0.1–1.0)
Monocytes Relative: 7 %
Neutro Abs: 15.9 10*3/uL — ABNORMAL HIGH (ref 1.7–7.7)
Neutrophils Relative %: 80 %
Platelets: 289 10*3/uL (ref 150–400)
RBC: 3.07 MIL/uL — ABNORMAL LOW (ref 3.87–5.11)
RDW: 17.1 % — ABNORMAL HIGH (ref 11.5–15.5)
WBC: 19.8 10*3/uL — ABNORMAL HIGH (ref 4.0–10.5)
nRBC: 0 % (ref 0.0–0.2)

## 2020-04-03 LAB — RESP PANEL BY RT-PCR (FLU A&B, COVID) ARPGX2
Influenza A by PCR: NEGATIVE
Influenza B by PCR: NEGATIVE
SARS Coronavirus 2 by RT PCR: NEGATIVE

## 2020-04-03 MED ORDER — COLLAGENASE 250 UNIT/GM EX OINT
TOPICAL_OINTMENT | Freq: Every day | CUTANEOUS | Status: DC
  Administered 2020-04-04: 1 via TOPICAL
  Filled 2020-04-03 (×2): qty 30

## 2020-04-03 MED ORDER — PIPERACILLIN-TAZOBACTAM 3.375 G IVPB 30 MIN
3.3750 g | Freq: Once | INTRAVENOUS | Status: AC
  Administered 2020-04-03: 3.375 g via INTRAVENOUS
  Filled 2020-04-03: qty 50

## 2020-04-03 MED ORDER — LACTATED RINGERS IV BOLUS
1000.0000 mL | Freq: Once | INTRAVENOUS | Status: AC
  Administered 2020-04-03: 1000 mL via INTRAVENOUS

## 2020-04-03 NOTE — Progress Notes (Signed)
PT  Hydro Note  Patient Details Name: AERIE DONICA MRN: [id number] DOB: [birth year]  Order received, will start hydro therapy for wound tomorrow.         Clide Dales 04/03/2020, 3:42 PM  Gatha Mayer, PT, MPT Acute Rehabilitation Services Office: [phone] Pager: [phone] 04/03/2020

## 2020-04-03 NOTE — ED Triage Notes (Signed)
Transported by [facility] from wound care appointment-- sent here to ED for further evaluation. Has wound to sacral area and right heel. MD wrote a note stating that patient will need IV antibiotics or possible debridement of wound. Patient is bed bound due to hx of stroke. Resident of [facility].

## 2020-04-03 NOTE — ED Notes (Signed)
Un able to obtain blood cultures and lactic. MD made aware.

## 2020-04-03 NOTE — H&P (Signed)
History and Physical    [name]:[id number] DOB: March 06, 1945 DOA: 04/03/2020  PCP: Hendricks Limes, MD  Patient coming from: Home  Chief Complaint: infected wounds  HPI: [name] is a 75 y.o. female with medical history significant of CAD, CABG, AS s/p TAVR, DM2, CKD 4, chronic diastolic HF, HLD, HTN, Breast CA s/p mastectomy. Patient presenting from wound care with concerns of infected sacral wound and heel wounds. She has been under the care of wound care for the last 2 weeks. Cultures per their note of the right heal were positive for morganella. She has been taking cefedinir 350m BID without improvement. After examination today, the staff was concerned and recommended hospitalization for IV abx. The patient denies fevers, chills, nausea, or vomiting. She states that her bottom and heels are painful. She denies any other aggravating or alleviating factors.   ED Course: EDP spoke with general surgery. They recommended hydrotherapy and santyl for the sacral wound. EDP consulted podiatry. Awaiting their recs. TRH was called for admission.    Review of Systems: Review of systems is otherwise negative for all not mentioned in HPI.   PMHx Past Medical History:  Diagnosis Date  . Acute on chronic diastolic CHF (congestive heart failure) [place] 06/22/2017  . Acute on chronic renal failure ([facility] 05/25/2011  . Adenocarcinoma of breast ([facility] [place]   Completed tamoxifen and had mastectomy.  . Aortic stenosis, severe    s/p aortic valve replacement with porcine valve 06/2004.  ECHO 2010 EF 676% LVH, diastolic dysfxn, Bioprostetic aoritc valve, mild AS. ECHO 2013 EF 60%, Nl aortic artificial valve, dynamic obstruction in the outflow tract   Class IIb rec for annual TTE after 5 yrs. She had a TTE 2013    . Arthritis   . CAD (coronary artery disease) 2006   s/p CABG (5/06) w/ saphenous vein to RCA at time of AVR  . CKD (chronic kidney disease) stage 2, GFR 60-89 ml/min 06/03/2011   She  is no longer taking NSAIDs.   .Marland KitchenCVA (cerebral infarction) 2006   Post-op from AVR. Presumed embolic in nature. Carotid stenosis of R 60-79%. Repeat dopplers 4/10 no R stenosis and L stenosis of 1-29%.  . Depression    Controlled on Paxil  . Diabetes mellitus 1992   Dx 04/25/1990. Now insulin dependent, started 2008. On ACEI.   . Diverticulosis 2001  . History of 2019 novel coronavirus disease (COVID-19)   . Hyperlipidemia    Mgmt with a statin  . Hypertension    Requires 4 drug tx  . Osteoporosis 2006   DEXA 10/06 : L femur T -2.8, R -2.7. Lumbar T -2.4. On bisphosphonates and  Calcium / Vit D.  . Peripheral vascular disease ([facility]   . Presence of permanent cardiac pacemaker   . Refusal of blood transfusions as patient is Jehovah's Witness   . Stroke ([facility]     PSHx Past Surgical History:  Procedure Laterality Date  . ABDOMINAL HYSTERECTOMY  1987   for fibroids  . AORTIC VALVE REPLACEMENT  2006  . CHOLECYSTECTOMY    . CORONARY ARTERY BYPASS GRAFT  2006   Saphenous vein to RCA at time of AVR. Course complicated by acute respiratory failure, post-op PTX, ARI, ileus, CVA  . LOWER EXTREMITY ANGIOGRAPHY N/A 01/04/2020   Procedure: LOWER EXTREMITY ANGIOGRAPHY - Right;  Surgeon: HCherre Robins MD;  [facility];  Service: Cardiovascular;  Laterality: N/A;  . MASTECTOMY Left 1995   L  for adenocarcinoma  . PACEMAKER IMPLANT N/A 02/09/2019   Procedure: PACEMAKER IMPLANT;  Surgeon: Constance Haw, MD;  Location: [place] [facility];  Service: Cardiovascular;  Laterality: N/A;  . PERIPHERAL VASCULAR BALLOON ANGIOPLASTY  01/04/2020   Procedure: PERIPHERAL VASCULAR BALLOON ANGIOPLASTY;  Surgeon: Cherre Robins, MD;  Location: [place] [facility];  Service: Cardiovascular;;  Rt AT  . RIGHT HEART CATH AND CORONARY/GRAFT ANGIOGRAPHY N/A 02/09/2019   Procedure: RIGHT HEART CATH AND CORONARY/GRAFT ANGIOGRAPHY;  Surgeon: Sherren Mocha, MD;  Location: [place] [facility];   Service: Cardiovascular;  Laterality: N/A;  . TEE WITHOUT CARDIOVERSION N/A 07/20/2019   Procedure: TRANSESOPHAGEAL ECHOCARDIOGRAM (TEE);  Surgeon: Jerline Pain, MD;  Location: [facility] ENDOSCOPY;  Service: Cardiovascular;  Laterality: N/A;  . TRANSCATHETER AORTIC VALVE REPLACEMENT, TRANSFEMORAL N/A 03/01/2019   Procedure: TRANSCATHETER AORTIC VALVE REPLACEMENT, TRANSFEMORAL;  Surgeon: Sherren Mocha, MD;  Location: [place] [facility];  Service: Open Heart Surgery;  Laterality: N/A;    SocHx  reports that she has never smoked. She has never used smokeless tobacco. She reports current alcohol use. She reports that she does not use drugs.  Allergies  Allergen Reactions  . 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," as the patient is a Jehovah's Witness    FamHx Family History  Problem Relation Age of Onset  . Diabetes Mother   . Hypertension Mother   . Alzheimer's disease Mother   . Heart disease Father 102       AMI at age 46 and 16  . Mental illness Sister   . Heart disease Sister 85       AMI  . Kidney disease Sister     Prior to Admission medications   Medication Sig Start Date End Date Taking? Authorizing Provider  acetaminophen (TYLENOL) 325 MG tablet Take 650 mg by mouth daily. Not to exceed 3000 mg in 24 hour per stranding order 07/28/19  Yes [provider]  Amino Acids-Protein Hydrolys (FEEDING SUPPLEMENT, PRO-STAT SUGAR FREE 64,) LIQD Take 30 mLs by mouth in the morning and at bedtime.   Yes [provider]  amoxicillin (AMOXIL) 500 MG capsule Take 2,000 mg by mouth once. As needed prior to dental procedure   Yes [provider]  aspirin 81 MG EC tablet TAKE 1 TABLET (81 MG TOTAL) BY MOUTH DAILY. SWALLOW WHOLE. Patient taking differently: Take 81 mg by mouth daily. 09/25/18  Yes Aslam, Loralyn Freshwater, MD  atorvastatin (LIPITOR) 80 MG tablet Take 1 tablet (80 mg total) by mouth at bedtime. 07/08/19  Yes [name], MD  cefdinir (OMNICEF) 300 MG  capsule Take 300 mg by mouth 2 (two) times daily. 03/27/20  Yes [provider]  clopidogrel (PLAVIX) 75 MG tablet Take 1 tablet (75 mg total) by mouth daily with breakfast. Patient taking differently: Take 75 mg by mouth daily. 06/16/19  Yes [name], MD  diclofenac Sodium (VOLTAREN) 1 % GEL Apply 4 g topically 4 (four) times daily as needed (Pain). Apply to bilateral hips and knees   Yes [provider]  DULoxetine (CYMBALTA) 30 MG capsule Take 30 mg by mouth daily.   Yes [provider]  Ensure (ENSURE) Take 237 mLs by mouth in the morning and at bedtime. Strawberry if available   Yes [provider]  gabapentin (NEURONTIN) 100 MG capsule Take 100 mg by mouth at bedtime.   Yes [provider]  hydrALAZINE (APRESOLINE) 25 MG tablet Take 25 mg by  mouth 2 (two) times daily. Hold if blood pressure < 110   Yes [provider]  lactulose (CHRONULAC) 10 GM/15ML solution Take 20 g by mouth daily. 10/17/19  Yes [provider]  magnesium hydroxide (MILK OF MAGNESIA) 400 MG/5ML suspension Take 30 mLs by mouth daily as needed for mild constipation.   Yes [provider]  melatonin 3 MG TABS tablet Take 6 mg by mouth every evening.   Yes [provider]  metoprolol tartrate (LOPRESSOR) 25 MG tablet Take 12.5 mg by mouth 2 (two) times daily. 03/23/20  Yes [provider]  mirtazapine (REMERON) 15 MG tablet Take 7.5 mg by mouth at bedtime. 03/31/20  Yes [provider]  Multiple Vitamins-Minerals (DECUBI-VITE) CAPS Take 1 capsule by mouth daily.   Yes [provider]  NOVOLOG FLEXPEN 100 UNIT/ML FlexPen Inject 5 Units into the skin 2 (two) times daily. cbg >/= 150 03/27/20  Yes [provider]  Nutritional Supplement LIQD Take 120 mLs by mouth 2 (two) times daily. MedPass   Yes [provider]  Nutritional Supplements (NUTRITIONAL DRINK PO) Take 1 Container by mouth in the morning, at  noon, and at bedtime. Magic cup   Yes [provider]  ondansetron (ZOFRAN) 4 MG tablet Take 4 mg by mouth every 12 (twelve) hours as needed for nausea or vomiting. 03/23/20  Yes [provider]  saccharomyces boulardii (FLORASTOR) 250 MG capsule Take 250 mg by mouth 2 (two) times daily.   Yes [provider]  white petrolatum (VASELINE) GEL Apply 1 application topically daily. Heels at bedtime   Yes [provider]  cefTRIAXone (ROCEPHIN) 1 g injection Inject 1 g into the muscle daily.    [provider]  Insulin Degludec-Liraglutide (XULTOPHY) 100-3.6 UNIT-MG/ML SOPN Inject 10 Units into the skin daily.    [provider]  metoprolol succinate (TOPROL-XL) 25 MG 24 hr tablet Take 1 tablet (25 mg total) by mouth daily. Patient not taking: Reported on 04/03/2020 09/19/19   Angelica Pou, MD    Physical Exam: Vitals:   04/03/20 1300 04/03/20 1410 04/03/20 1420 04/03/20 1500  BP: (!) 110/47 (!) 107/48 (!) 106/58 (!) 101/49  Pulse: 64 62 64 67  Resp: _0 Temp:      TempSrc:      SpO2: 100% 99% 100% 100%    General: 75 y.o. female resting in bed in NAD Eyes: PERRL, normal sclera ENMT: Nares patent w/o discharge, orophaynx clear, dentition normal, ears w/o discharge/lesions/ulcers Neck: Supple, trachea midline Cardiovascular: RRR, +S1, S2, no g/r, 3/6 SEM equal pulses throughout Respiratory: CTABL, no w/r/r, normal WOB GI: BS+, NDNT, no masses noted, no organomegaly noted MSK: No e/c/c Skin: sacral decub; right great toe ulcer, right heal wound Neuro: A&O x 3, no focal deficits Psyc: Appropriate interaction and affect, calm/cooperative  Labs on Admission: I have personally reviewed following labs and imaging studies  CBC: Recent Labs  Lab 04/03/20 1251  WBC 19.8*  NEUTROABS 15.9*  HGB 9.5*  HCT 29.1*  MCV 94.8  PLT 542   Basic Metabolic Panel: Recent Labs  Lab 04/03/20 1251  NA 132*  K 4.4  CL 99  CO2 21*   GLUCOSE 188*  BUN 90*  CREATININE 2.20*  CALCIUM 8.1*   GFR: Estimated Creatinine Clearance: 19.6 mL/min (A) (by C-G formula based on SCr of 2.2 mg/dL (H)). Liver Function Tests: Recent Labs  Lab 04/03/20 1251  AST 19  ALT 20  ALKPHOS  142*  BILITOT 0.9  PROT 5.8*  ALBUMIN 1.7*   No results for input(s): LIPASE, AMYLASE in the last 168 hours. No results for input(s): AMMONIA in the last 168 hours. Coagulation Profile: No results for input(s): INR, PROTIME in the last 168 hours. Cardiac Enzymes: No results for input(s): CKTOTAL, CKMB, CKMBINDEX, TROPONINI in the last 168 hours. BNP (last 3 results) Recent Labs    08/11/19 1638 08/23/19 1701  PROBNP 2,473* 3,638*   HbA1C: No results for input(s): HGBA1C in the last 72 hours. CBG: No results for input(s): GLUCAP in the last 168 hours. Lipid Profile: No results for input(s): CHOL, HDL, LDLCALC, TRIG, CHOLHDL, LDLDIRECT in the last 72 hours. Thyroid Function Tests: No results for input(s): TSH, T4TOTAL, FREET4, T3FREE, THYROIDAB in the last 72 hours. Anemia Panel: No results for input(s): VITAMINB12, FOLATE, FERRITIN, TIBC, IRON, RETICCTPCT in the last 72 hours. Urine analysis:    Component Value Date/Time   COLORURINE STRAW (A) 07/17/2019 2137   APPEARANCEUR CLEAR 07/17/2019 2137   APPEARANCEUR Clear 09/01/2018 1131   LABSPEC 1.009 07/17/2019 2137   PHURINE 8.0 07/17/2019 2137   GLUCOSEU 150 (A) 07/17/2019 2137   GLUCOSEU NEG mg/dL 12/25/2006 2041   HGBUR SMALL (A) 07/17/2019 2137   HGBUR trace-lysed 03/05/2010 0856   BILIRUBINUR NEGATIVE 07/17/2019 2137   BILIRUBINUR Negative 09/01/2018 [place] 07/17/2019 2137   PROTEINUR 100 (A) 07/17/2019 2137   UROBILINOGEN 0.2 11/11/2011 1701   UROBILINOGEN 0.2 11/11/2011 1645   NITRITE NEGATIVE 07/17/2019 2137   LEUKOCYTESUR NEGATIVE 07/17/2019 2137    Radiological Exams on Admission: DG Sacrum/Coccyx  Result Date: 04/03/2020 CLINICAL DATA:  Sacral  wound EXAM: SACRUM AND COCCYX - 2+ VIEW COMPARISON:  October 06, 2005 FINDINGS: Frontal, angled frontal, and lateral views were obtained. There is no fracture or diastasis. No erosive change or bony destruction evident. There is osteoarthritic change in each sacroiliac joint. No soft tissue air evident. IMPRESSION: No bony destruction. No fracture or diastasis. Osteoarthritic change in each sacroiliac joint noted. Electronically Signed   By: Lowella Grip III M.D.   On: 04/03/2020 14:36   DG Foot Complete Right  Result Date: 04/03/2020 CLINICAL DATA:  Pain EXAM: RIGHT FOOT COMPLETE - 3+ VIEW COMPARISON:  February 08, 2020 FINDINGS: Frontal, oblique, and lateral views were obtained. Portions of the mid to distal aspects of the first distal phalanx are absent, likely due to chronic bony destruction. There is a focal area of apparent bony destruction along the lateral aspect of the distal portion of the remaining first distal phalanx, concerning for active osteomyelitis in this area. No other bony destruction seen. Prior fractures of the proximal aspect of the second proximal phalanx and distal aspect of the third proximal phalanx appear stable. No new fracture evident. No dislocation. Bones are osteoporotic. Joint spaces appear unremarkable. There is widespread arterial vascular calcification. IMPRESSION: Apparent osteomyelitis involving a portion of the first distal phalanx. Note that much of this bone has undergone previous resorption, likely due to chronic osteomyelitis change. Diffuse osteoporosis. Prior fractures of the proximal aspect of the second proximal phalanx and distal aspect of the third proximal phalanx. No acute fracture or dislocation. Widespread arterial vascular calcification noted. Electronically Signed   By: Lowella Grip III M.D.   On: 04/03/2020 14:40    Assessment/Plan Non-healing right high ulcer Non-healing right great toe ulcer Non-healing sacral ulcer     - admit to inpt,  tele     - started on zosyn;  switch to cefepime, flagyl d/t renal function     - gen surg recs hydrotherapy for sacral wound     - awaiting podiatry final recs for foot wounds, will defer further imaging to them     - fluids, pain control  CKD 4     - slightly off baseline, watch nephrotoxins     - she will get gentle fluids     - change abx to cefepime, flagyl  Chronic diastolic HF CAD     - continue home regimen  HTN     - continue home regimen as able  HLD     - continue statin  DM2     - SSI, DM2 diet, glucose check, A1c  Normocytic anemia     - no evidence of bleed, follow  Elevated alk phos     - check ggt  Hyponatremia     - mild, follow   Anxiety     - continue cymbalta and remeron  DVT prophylaxis: heparin  Code Status: FULL  Family Communication: None at bedside.  Consults called: general surgery, podiatry  Status is: Inpatient  Remains inpatient appropriate because:Inpatient level of care appropriate due to severity of illness   Dispo: The patient is from: SNF              Anticipated d/c is to: SNF              Anticipated d/c date is: 1 day              Patient currently is not medically stable to d/c.   Difficult to place patient No  Time spent coordinating admission: 60 minutes  [facility]  If 7PM-7AM, please contact night-coverage [email or web address]  04/03/2020, 3:50 PM

## 2020-04-03 NOTE — Telephone Encounter (Signed)
You welcome.  Teamwork, makes the dream work :)

## 2020-04-03 NOTE — Consult Note (Signed)
Reason for Consult: Heel ulcer, infection  Referring Physician: Dr. Cherylann Ratel, DO  [name] is an 75 y.o. female.  HPI: 75 y.o. female with medical history significant of CAD, CABG, AS s/p TAVR, DM2, CKD 4, chronic diastolic HF, HLD, HTN, Breast CA s/p mastectomy. Patient presenting from wound care with concerns of infected sacral wound and heel wounds. She has been under the care of wound care for the last 2 weeks. Previously seen by Dr. Cannon Kettle in the office.  Cultures per their note of the right heal were positive for morganella. She has been taking cefedinir $RemoveBeforeDE'300mg'vQbyAsrUeEheNfK$  BID without improvement. After examination today, the staff was concerned and recommended hospitalization for IV abx.  In ER WBC 19.8, Hg 9.5, blood cultures pending  Previously has seen vascular surgery. She is s/p endovascular recanalization of R AT artery 01/04/20. Per the last note not a candidate for further intervention.     Past Medical History:  Diagnosis Date  . Acute on chronic diastolic CHF (congestive heart failure) ([place]) 06/22/2017  . Acute on chronic renal failure ([place]) 05/25/2011  . Adenocarcinoma of breast ([place]) 1996   Completed tamoxifen and had mastectomy.  . Aortic stenosis, severe    s/p aortic valve replacement with porcine valve 06/2004.  ECHO 2010 EF 53%, LVH, diastolic dysfxn, Bioprostetic aoritc valve, mild AS. ECHO 2013 EF 60%, Nl aortic artificial valve, dynamic obstruction in the outflow tract   Class IIb rec for annual TTE after 5 yrs. She had a TTE 2013    . Arthritis   . CAD (coronary artery disease) 2006   s/p CABG (5/06) w/ saphenous vein to RCA at time of AVR  . CKD (chronic kidney disease) stage 2, GFR 60-89 ml/min 06/03/2011   She is no longer taking NSAIDs.   Marland Kitchen CVA (cerebral infarction) 2006   Post-op from AVR. Presumed embolic in nature. Carotid stenosis of R 60-79%. Repeat dopplers 4/10 no R stenosis and L stenosis of 1-29%.  . Depression    Controlled on Paxil  . Diabetes mellitus  1992   Dx 04/25/1990. Now insulin dependent, started 2008. On ACEI.   . Diverticulosis 2001  . History of 2019 novel coronavirus disease (COVID-19)   . Hyperlipidemia    Mgmt with a statin  . Hypertension    Requires 4 drug tx  . Osteoporosis 2006   DEXA 10/06 : L femur T -2.8, R -2.7. Lumbar T -2.4. On bisphosphonates and  Calcium / Vit D.  . Peripheral vascular disease ([place])   . Presence of permanent cardiac pacemaker   . Refusal of blood transfusions as patient is Jehovah's Witness   . Stroke [facility])     Past Surgical History:  Procedure Laterality Date  . ABDOMINAL HYSTERECTOMY  1987   for fibroids  . AORTIC VALVE REPLACEMENT  2006  . CHOLECYSTECTOMY    . CORONARY ARTERY BYPASS GRAFT  2006   Saphenous vein to RCA at time of AVR. Course complicated by acute respiratory failure, post-op PTX, ARI, ileus, CVA  . LOWER EXTREMITY ANGIOGRAPHY N/A 01/04/2020   Procedure: LOWER EXTREMITY ANGIOGRAPHY - Right;  Surgeon: Cherre Robins, MD;  Location: [place] CV LAB;  Service: Cardiovascular;  Laterality: N/A;  . MASTECTOMY Left 1995   L for adenocarcinoma  . PACEMAKER IMPLANT N/A 02/09/2019   Procedure: PACEMAKER IMPLANT;  Surgeon: Constance Haw, MD;  Location: [place] [facility] LAB;  Service: Cardiovascular;  Laterality: N/A;  . PERIPHERAL VASCULAR BALLOON ANGIOPLASTY  01/04/2020  Procedure: PERIPHERAL VASCULAR BALLOON ANGIOPLASTY;  Surgeon: Cherre Robins, MD;  Location: [place] CV LAB;  Service: Cardiovascular;;  Rt AT  . RIGHT HEART CATH AND CORONARY/GRAFT ANGIOGRAPHY N/A 02/09/2019   Procedure: RIGHT HEART CATH AND CORONARY/GRAFT ANGIOGRAPHY;  Surgeon: Sherren Mocha, MD;  Location: [place] CV LAB;  Service: Cardiovascular;  Laterality: N/A;  . TEE WITHOUT CARDIOVERSION N/A 07/20/2019   Procedure: TRANSESOPHAGEAL ECHOCARDIOGRAM (TEE);  Surgeon: Jerline Pain, MD;  Location: [facility] ENDOSCOPY;  Service: Cardiovascular;  Laterality: N/A;  . TRANSCATHETER AORTIC VALVE  REPLACEMENT, TRANSFEMORAL N/A 03/01/2019   Procedure: TRANSCATHETER AORTIC VALVE REPLACEMENT, TRANSFEMORAL;  Surgeon: Sherren Mocha, MD;  Location: [place] CV LAB;  Service: Open Heart Surgery;  Laterality: N/A;    Family History  Problem Relation Age of Onset  . Diabetes Mother   . Hypertension Mother   . Alzheimer's disease Mother   . Heart disease Father 28       AMI at age 42 and 42  . Mental illness Sister   . Heart disease Sister 74       AMI  . Kidney disease Sister     Social History:  reports that she has never smoked. She has never used smokeless tobacco. She reports current alcohol use. She reports that she does not use drugs.  Allergies:  Allergies  Allergen Reactions  . 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," as the patient is a Jehovah's Witness    Medications: I have reviewed the patient's current medications.  Results for orders placed or performed during the hospital encounter of 04/03/20 (from the past 48 hour(s))  Comprehensive metabolic panel     Status: Abnormal   Collection Time: 04/03/20 12:51 PM  Result Value Ref Range   Sodium 132 (L) 135 - 145 mmol/L   Potassium 4.4 3.5 - 5.1 mmol/L   Chloride 99 98 - 111 mmol/L   CO2 21 (L) 22 - 32 mmol/L   Glucose, Bld 188 (H) 70 - 99 mg/dL    Comment: Glucose reference range applies only to samples taken after fasting for at least 8 hours.   BUN 90 (H) 8 - 23 mg/dL   Creatinine, Ser 2.20 (H) 0.44 - 1.00 mg/dL   Calcium 8.1 (L) 8.9 - 10.3 mg/dL   Total Protein 5.8 (L) 6.5 - 8.1 g/dL   Albumin 1.7 (L) 3.5 - 5.0 g/dL   AST 19 15 - 41 U/L   ALT 20 0 - 44 U/L   Alkaline Phosphatase 142 (H) 38 - 126 U/L   Total Bilirubin 0.9 0.3 - 1.2 mg/dL   GFR, Estimated 23 (L) >60 mL/min    Comment: (NOTE) Calculated using the CKD-EPI Creatinine Equation (2021)    Anion gap 12 5 - 15    Comment: Performed at [facility], [place] [address] [place], [place] [address]  CBC with Differential      Status: Abnormal   Collection Time: 04/03/20 12:51 PM  Result Value Ref Range   WBC 19.8 (H) 4.0 - 10.5 K/uL   RBC 3.07 (L) 3.87 - 5.11 MIL/uL   Hemoglobin 9.5 (L) 12.0 - 15.0 g/dL   HCT 29.1 (L) 36.0 - 46.0 %   MCV 94.8 80.0 - 100.0 fL   MCH 30.9 26.0 - 34.0 pg   MCHC 32.6 30.0 - 36.0 g/dL   RDW 17.1 (H) 11.5 - 15.5 %   Platelets 289 150 - 400 K/uL   nRBC 0.0 0.0 -  0.2 %   Neutrophils Relative % 80 %   Neutro Abs 15.9 (H) 1.7 - 7.7 K/uL   Lymphocytes Relative 11 %   Lymphs Abs 2.1 0.7 - 4.0 K/uL   Monocytes Relative 7 %   Monocytes Absolute 1.4 (H) 0.1 - 1.0 K/uL   Eosinophils Relative 1 %   Eosinophils Absolute 0.2 0.0 - 0.5 K/uL   Basophils Relative 0 %   Basophils Absolute 0.0 0.0 - 0.1 K/uL   Immature Granulocytes 1 %   Abs Immature Granulocytes 0.13 (H) 0.00 - 0.07 K/uL    Comment: Performed at [facility], [place] [address], [place], [place] [address]  Resp Panel by RT-PCR (Flu A&B, Covid) Nasopharyngeal Swab     Status: None   Collection Time: 04/03/20  2:46 PM   Specimen: Nasopharyngeal Swab; Nasopharyngeal(NP) swabs in vial transport medium  Result Value Ref Range   SARS Coronavirus 2 by RT PCR NEGATIVE NEGATIVE    Comment: (NOTE) SARS-CoV-2 target nucleic acids are NOT DETECTED.  The SARS-CoV-2 RNA is generally detectable in upper respiratory specimens during the acute phase of infection. The lowest concentration of SARS-CoV-2 viral copies this assay can detect is 138 copies/mL. A negative result does not preclude SARS-Cov-2 infection and should not be used as the sole basis for treatment or other patient management decisions. A negative result may occur with  improper specimen collection/handling, submission of specimen other than nasopharyngeal swab, presence of viral mutation(s) within the areas targeted by this assay, and inadequate number of viral copies(<138 copies/mL). A negative result must be combined with clinical observations,  patient history, and epidemiological information. The expected result is Negative.  Fact Sheet for Patients:  EntrepreneurPulse.com.au  Fact Sheet for Healthcare Providers:  IncredibleEmployment.be  This test is no t yet approved or cleared by the [place] FDA and  has been authorized for detection and/or diagnosis of SARS-CoV-2 by FDA under an Emergency Use Authorization (EUA). This EUA will remain  in effect (meaning this test can be used) for the duration of the COVID-19 declaration under Section 564(b)(1) of the Act, 21 U.S.C.section 360bbb-3(b)(1), unless the authorization is terminated  or revoked sooner.       Influenza A by PCR NEGATIVE NEGATIVE   Influenza B by PCR NEGATIVE NEGATIVE    Comment: (NOTE) The Xpert Xpress SARS-CoV-2/FLU/RSV plus assay is intended as an aid in the diagnosis of influenza from Nasopharyngeal swab specimens and should not be used as a sole basis for treatment. Nasal washings and aspirates are unacceptable for Xpert Xpress SARS-CoV-2/FLU/RSV testing.  Fact Sheet for Patients: EntrepreneurPulse.com.au  Fact Sheet for Healthcare Providers: IncredibleEmployment.be  This test is not yet approved or cleared by the [place] FDA and has been authorized for detection and/or diagnosis of SARS-CoV-2 by FDA under an Emergency Use Authorization (EUA). This EUA will remain in effect (meaning this test can be used) for the duration of the COVID-19 declaration under Section 564(b)(1) of the Act, 21 U.S.C. section 360bbb-3(b)(1), unless the authorization is terminated or revoked.  Performed at [facility], [place] [address], [place], [place] [address]     DG Sacrum/Coccyx  Result Date: 04/03/2020 CLINICAL DATA:  Sacral wound EXAM: SACRUM AND COCCYX - 2+ VIEW COMPARISON:  October 06, 2005 FINDINGS: Frontal, angled frontal, and lateral views were obtained.  There is no fracture or diastasis. No erosive change or bony destruction evident. There is osteoarthritic change in each sacroiliac joint. No soft tissue air evident. IMPRESSION: No  bony destruction. No fracture or diastasis. Osteoarthritic change in each sacroiliac joint noted. Electronically Signed   By: Lowella Grip III M.D.   On: 04/03/2020 14:36   DG Foot Complete Right  Result Date: 04/03/2020 CLINICAL DATA:  Pain EXAM: RIGHT FOOT COMPLETE - 3+ VIEW COMPARISON:  February 08, 2020 FINDINGS: Frontal, oblique, and lateral views were obtained. Portions of the mid to distal aspects of the first distal phalanx are absent, likely due to chronic bony destruction. There is a focal area of apparent bony destruction along the lateral aspect of the distal portion of the remaining first distal phalanx, concerning for active osteomyelitis in this area. No other bony destruction seen. Prior fractures of the proximal aspect of the second proximal phalanx and distal aspect of the third proximal phalanx appear stable. No new fracture evident. No dislocation. Bones are osteoporotic. Joint spaces appear unremarkable. There is widespread arterial vascular calcification. IMPRESSION: Apparent osteomyelitis involving a portion of the first distal phalanx. Note that much of this bone has undergone previous resorption, likely due to chronic osteomyelitis change. Diffuse osteoporosis. Prior fractures of the proximal aspect of the second proximal phalanx and distal aspect of the third proximal phalanx. No acute fracture or dislocation. Widespread arterial vascular calcification noted. Electronically Signed   By: Lowella Grip III M.D.   On: 04/03/2020 14:40    Review of Systems Blood pressure (!) 118/58, pulse 76, temperature 98.1 F (36.7 C), temperature source Oral, resp. rate 18, SpO2 100 %. Physical Exam General: AAO x3, NAD  Dermatological: Large ulceration present on the posterior aspect of right heel, lateral  aspect of granular wound base as well as fibrotic tissue.  No probing to bone although close.  There is no drainage or pus.  There is no surrounding erythema, ascending cellulitis.  No fluctuation crepitation.  Superficial wound in the right hallux.  Chronic edema present but there is no erythema or warmth.  Dry, scaly skin is present bilaterally.  See pictures note below.            Vascular: DP/PT pulses absent   Neruologic: Sensation decreased  Musculoskeletal: No pain on exam.   Assessment/Plan:  X-rays reviewed.  Likely chronic osteomyelitis of the right hallux.  Given the wound on the right heel would recommend CT scan to rule out osteomyelitis and this was ordered for her today.  Continue IV Invanz for now.  Monitor white blood cell count, blood cultures.  Will monitor clinically.  Discussed with her possible surgical intervention to debride the wound as well as bone biopsy if needed.  Concerned given her circulation status as well as she is undergoing difficult healing this wound.  Vascular surgery is previously been consulted and evaluated the patient. Podiatry will continue to follow  Trula Slade 04/03/2020, 6:20 PM

## 2020-04-03 NOTE — Telephone Encounter (Signed)
Thanks keely for getting me help with patient's at [facility]. I will get updates on these patients from [place]. Thank you I have a census of 6 patients now 2 at [facility] 2 at [place] 2 new ones today from [facility]

## 2020-04-03 NOTE — Consult Note (Addendum)
[name] 08/07/1945  [id number].    Requesting MD: Dr. Davonna Belling Chief Complaint/Reason for Consult: sacral decubitus ulcer   HPI:  This is a 75 yo black female with multiple medical problems as listed below who is on plavix and lives at [place].  She states that she was sent to [facility] from her wound care center this morning for evaluation.  She thinks she just developed this decubitus ulcer about 3 weeks ago, although according to her chart it looks like she had a wound last year as well.  The patient also has an ulcer on her right posterior heel/ankle area.  She denies fevers that she knows of.  We have been asked to see her for further evaluation of her wound.  ROS: ROS: Please see HPI, otherwise negative.  Family History  Problem Relation Age of Onset  . Diabetes Mother   . Hypertension Mother   . Alzheimer's disease Mother   . Heart disease Father 70       AMI at age 63 and 16  . Mental illness Sister   . Heart disease Sister 28       AMI  . Kidney disease Sister     Past Medical History:  Diagnosis Date  . Acute on chronic diastolic CHF (congestive heart failure) ([place]) 06/22/2017  . Acute on chronic renal failure ([place]) 05/25/2011  . Adenocarcinoma of breast ([place]) 1996   Completed tamoxifen and had mastectomy.  . Aortic stenosis, severe    s/p aortic valve replacement with porcine valve 06/2004.  ECHO 2010 EF 49%, LVH, diastolic dysfxn, Bioprostetic aoritc valve, mild AS. ECHO 2013 EF 60%, Nl aortic artificial valve, dynamic obstruction in the outflow tract   Class IIb rec for annual TTE after 5 yrs. She had a TTE 2013    . Arthritis   . CAD (coronary artery disease) 2006   s/p CABG (5/06) w/ saphenous vein to RCA at time of AVR  . CKD (chronic kidney disease) stage 2, GFR 60-89 ml/min 06/03/2011   She is no longer taking NSAIDs.   Marland Kitchen CVA (cerebral infarction) 2006   Post-op from AVR. Presumed embolic in nature. Carotid stenosis of R 60-79%. Repeat dopplers  4/10 no R stenosis and L stenosis of 1-29%.  . Depression    Controlled on Paxil  . Diabetes mellitus 1992   Dx 04/25/1990. Now insulin dependent, started 2008. On ACEI.   . Diverticulosis 2001  . History of 2019 novel coronavirus disease (COVID-19)   . Hyperlipidemia    Mgmt with a statin  . Hypertension    Requires 4 drug tx  . Osteoporosis 2006   DEXA 10/06 : L femur T -2.8, R -2.7. Lumbar T -2.4. On bisphosphonates and  Calcium / Vit D.  . Peripheral vascular disease ([place])   . Presence of permanent cardiac pacemaker   . Refusal of blood transfusions as patient is Jehovah's Witness   . Stroke [facility])     Past Surgical History:  Procedure Laterality Date  . ABDOMINAL HYSTERECTOMY  1987   for fibroids  . AORTIC VALVE REPLACEMENT  2006  . CHOLECYSTECTOMY    . CORONARY ARTERY BYPASS GRAFT  2006   Saphenous vein to RCA at time of AVR. Course complicated by acute respiratory failure, post-op PTX, ARI, ileus, CVA  . LOWER EXTREMITY ANGIOGRAPHY N/A 01/04/2020   Procedure: LOWER EXTREMITY ANGIOGRAPHY - Right;  Surgeon: Cherre Robins, MD;  Location: [place] CV LAB;  Service: Cardiovascular;  Laterality: N/A;  . MASTECTOMY Left 1995   L for adenocarcinoma  . PACEMAKER IMPLANT N/A 02/09/2019   Procedure: PACEMAKER IMPLANT;  Surgeon: Regan Lemming, MD;  [facility] LAB;  Service: Cardiovascular;  Laterality: N/A;  . PERIPHERAL VASCULAR BALLOON ANGIOPLASTY  01/04/2020   Procedure: PERIPHERAL VASCULAR BALLOON ANGIOPLASTY;  Surgeon: Leonie Douglas, MD;  Location: [facility] INVASIVE CV LAB;  Service: Cardiovascular;;  Rt AT  . RIGHT HEART CATH AND CORONARY/GRAFT ANGIOGRAPHY N/A 02/09/2019   Procedure: RIGHT HEART CATH AND CORONARY/GRAFT ANGIOGRAPHY;  Surgeon: Tonny Bollman, MD;  Location: [facility] INVASIVE CV LAB;  Service: Cardiovascular;  Laterality: N/A;  . TEE WITHOUT CARDIOVERSION N/A 07/20/2019   Procedure: TRANSESOPHAGEAL ECHOCARDIOGRAM (TEE);  Surgeon: Jake Bathe, MD;   Location: [facility] ENDOSCOPY;  Service: Cardiovascular;  Laterality: N/A;  . TRANSCATHETER AORTIC VALVE REPLACEMENT, TRANSFEMORAL N/A 03/01/2019   Procedure: TRANSCATHETER AORTIC VALVE REPLACEMENT, TRANSFEMORAL;  Surgeon: Tonny Bollman, MD;  Location: [facility] INVASIVE CV LAB;  Service: Open Heart Surgery;  Laterality: N/A;    Social History:  reports that she has never smoked. She has never used smokeless tobacco. She reports current alcohol use. She reports that she does not use drugs.  Allergies:  Allergies  Allergen Reactions  . 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," as the patient is a Jehovah's Witness    (Not in a hospital admission)    Physical Exam: Blood pressure (!) 108/53, pulse 65, temperature 98.1 F (36.7 C), temperature source Oral, resp. rate 16, SpO2 100 %. General: pleasant, WD, WN black female who is laying in bed in NAD HEENT: head is normocephalic, atraumatic.  Sclera are noninjected.  PERRL.  Ears and nose without any masses or lesions.  Mouth is pink and moist Heart: brady but irregular.  Pacemaker in place.  Normal s1,s2. No obvious murmurs, gallops, or rubs noted.  Palpable radial pulses bilaterally Lungs: CTAB, no wheezes, rhonchi, or rales noted.  Respiratory effort nonlabored MS: all 4 extremities are symmetrical with no cyanosis, clubbing, or edema; however she has flaking dry skin on her lower extremities with an large wound on the posterior aspect of her right heel. Skin: unstageable sacral decubitus ulcer.  There is some surrounding cellulitis possibly.  No purulent drainage, but semi-firm eschar present so it is unclear what is beneath this.    Psych: A&Ox3 with an appropriate affect.   Results for orders placed or performed during the hospital encounter of 04/03/20 (from the past 48 hour(s))  Comprehensive metabolic panel     Status: Abnormal   Collection Time: 04/03/20 12:51 PM  Result Value Ref Range   Sodium 132 (L) 135 - 145 mmol/L   Potassium  4.4 3.5 - 5.1 mmol/L   Chloride 99 98 - 111 mmol/L   CO2 21 (L) 22 - 32 mmol/L   Glucose, Bld 188 (H) 70 - 99 mg/dL    Comment: Glucose reference range applies only to samples taken after fasting for at least 8 hours.   BUN 90 (H) 8 - 23 mg/dL   Creatinine, Ser 6.68 (H) 0.44 - 1.00 mg/dL   Calcium 8.1 (L) 8.9 - 10.3 mg/dL   Total Protein 5.8 (L) 6.5 - 8.1 g/dL   Albumin 1.7 (L) 3.5 - 5.0 g/dL   AST 19 15 - 41 U/L   ALT 20 0 - 44 U/L   Alkaline Phosphatase 142 (H) 38 - 126 U/L   Total Bilirubin 0.9 0.3 - 1.2 mg/dL  GFR, Estimated 23 (L) >60 mL/min    Comment: (NOTE) Calculated using the CKD-EPI Creatinine Equation (2021)    Anion gap 12 5 - 15    Comment: Performed at [facility], [place] [address], [place], [place] [address]  CBC with Differential     Status: Abnormal   Collection Time: 04/03/20 12:51 PM  Result Value Ref Range   WBC 19.8 (H) 4.0 - 10.5 K/uL   RBC 3.07 (L) 3.87 - 5.11 MIL/uL   Hemoglobin 9.5 (L) 12.0 - 15.0 g/dL   HCT 29.1 (L) 36.0 - 46.0 %   MCV 94.8 80.0 - 100.0 fL   MCH 30.9 26.0 - 34.0 pg   MCHC 32.6 30.0 - 36.0 g/dL   RDW 17.1 (H) 11.5 - 15.5 %   Platelets 289 150 - 400 K/uL   nRBC 0.0 0.0 - 0.2 %   Neutrophils Relative % 80 %   Neutro Abs 15.9 (H) 1.7 - 7.7 K/uL   Lymphocytes Relative 11 %   Lymphs Abs 2.1 0.7 - 4.0 K/uL   Monocytes Relative 7 %   Monocytes Absolute 1.4 (H) 0.1 - 1.0 K/uL   Eosinophils Relative 1 %   Eosinophils Absolute 0.2 0.0 - 0.5 K/uL   Basophils Relative 0 %   Basophils Absolute 0.0 0.0 - 0.1 K/uL   Immature Granulocytes 1 %   Abs Immature Granulocytes 0.13 (H) 0.00 - 0.07 K/uL    Comment: Performed at [facility], [place] [address] [place], [place] [address]   No results found.    Assessment/Plan Multiple medical problems  Unstageable sacral decubitus ulcer The patient has multiple medical problems and would like to avoid surgical intervention on this wound if able.  I think it is  reasonable to start with conservative management with santyl, dressing changes, and PT hydrotherapy.  We will recheck the wound in a couple of days to see if we are making progress and to see if any bedside debridement may need to be done.  This was discussed with the patient.  Recommend holding Plavix as well incase we need to do any intervention.   Henreitta Cea, [facility] Surgery 04/03/2020, 2:21 PM Please see Amion for pager number during day hours 7:00am-4:30pm or 7:00am -11:30am on weekends

## 2020-04-03 NOTE — Telephone Encounter (Signed)
Dr. Jacqualyn Posey could you see this patient as well today?

## 2020-04-03 NOTE — ED Notes (Signed)
Applied med per mar on sacrum area and covered with meplex, also placed meplex on the wound on left heal. Gave pt a sandwich and drink. Pt denies any other needs at this time.

## 2020-04-03 NOTE — Progress Notes (Addendum)
[name], [name] ([id number]) Visit Report for 04/03/2020 Arrival Information Details Patient Name: Date of Service: [name], [name] 04/03/2020 10:45 A M Medical Record Number: [id number] Patient Account Number: [id number] Date of Birth/Sex: Treating RN: 07-30-1945 (75 y.o. Female) Baruch Gouty Primary Care Maleka Contino: Hendricks Limes Other Clinician: Referring Ulas Zuercher: Treating Kinleigh Nault/Extender: Mare Ferrari in Treatment: 2 Visit Information History Since Last Visit Added or deleted any medications: No Patient Arrived: Stretcher Any new allergies or adverse reactions: No Arrival Time: 10:36 Had a fall or experienced change in No Accompanied By: EMS activities of daily living that may affect Transfer Assistance: Stretcher risk of falls: Patient Identification Verified: Yes Signs or symptoms of abuse/neglect since last visito No Secondary Verification Process Completed: Yes Hospitalized since last visit: No Patient Requires Transmission-Based Precautions: No Implantable device outside of the clinic excluding No Patient Has Alerts: No cellular tissue based products placed in the center since last visit: Has Dressing in Place as Prescribed: Yes Pain Present Now: Yes Electronic Signature(s) Signed: 04/03/2020 5:13:50 PM By: Baruch Gouty RN, BSN Entered By: Baruch Gouty on 04/03/2020 10:45:45 -------------------------------------------------------------------------------- Encounter Discharge Information Details Patient Name: Date of Service: [name]. 04/03/2020 10:45 A M Medical Record Number: [id number] Patient Account Number: [id number] Date of Birth/Sex: Treating RN: 11-03-1945 (75 y.o. Female) Levan Hurst Primary Care Mersadies Petree: Hendricks Limes Other Clinician: Referring Julaine Zimny: Treating Toy Eisemann/Extender: Mare Ferrari in Treatment: 2 Encounter Discharge Information Items Post Procedure  Vitals Discharge Condition: Stable Temperature (F): 99 Ambulatory Status: Stretcher Pulse (bpm): 73 Discharge Destination: Emergency Room Respiratory Rate (breaths/min): 18 Telephoned: No Blood Pressure (mmHg): 119/78 Orders Sent: Yes Transportation: Ambulance Accompanied By: alone Schedule Follow-up Appointment: Yes Clinical Summary of Care: Patient Declined Electronic Signature(s) Signed: 04/03/2020 5:04:56 PM By: Levan Hurst RN, BSN Entered By: Levan Hurst on 04/03/2020 14:49:26 -------------------------------------------------------------------------------- Lower Extremity Assessment Details Patient Name: Date of Service: [name] 04/03/2020 10:45 A M Medical Record Number: [id number] Patient Account Number: [id number] Date of Birth/Sex: Treating RN: 05/05/1945 (75 y.o. Female) Baruch Gouty Primary Care Hilarie Sinha: Hendricks Limes Other Clinician: Referring Folashade Gamboa: Treating Tala Audrey/Extender: Malachy Moan Weeks in Treatment: 2 Edema Assessment Assessed: [Left: No] [Right: No] Edema: [Left: N] [Right: o] Calf Left: Right: Point of Measurement: From Medial Instep 29.5 cm Ankle Left: Right: Point of Measurement: From Medial Instep 18.7 cm Vascular Assessment Pulses: Dorsalis Pedis Palpable: [Right:No] Electronic Signature(s) Signed: 04/03/2020 5:13:50 PM By: Baruch Gouty RN, BSN Entered By: Baruch Gouty on 04/03/2020 10:47:33 -------------------------------------------------------------------------------- Multi Wound Chart Details Patient Name: Date of Service: [name] 04/03/2020 10:45 A M Medical Record Number: [id number] Patient Account Number: [id number] Date of Birth/Sex: Treating RN: Jul 22, 1945 (75 y.o. Female) Rhae Hammock Primary Care Katoya Amato: Hendricks Limes Other Clinician: Referring Yezenia Fredrick: Treating Cambria Osten/Extender: Mare Ferrari in Treatment: 2 Vital  Signs Height(in): 66 Pulse(bpm): 53 Weight(lbs): 165 Blood Pressure(mmHg): 119/78 Body Mass Index(BMI): 27 Temperature(F): 99 Respiratory Rate(breaths/min): 30 Photos: [1:No Photos Right T Great oe] [2:No Photos Right Calcaneus] [3:No Photos Sacrum] Wound Location: [1:Gradually Appeared] [2:Pressure Injury] [3:Pressure Injury] Wounding Event: [1:Diabetic Wound/Ulcer of the Lower] [2:Diabetic Wound/Ulcer of the Lower] [3:Pressure Ulcer] Primary Etiology: [1:Extremity Congestive Heart Failure, Coronary] [2:Extremity Congestive Heart Failure, Coronary] [3:Congestive Heart Failure, Coronary] Comorbid History: [1:Artery Disease, Hypertension, Peripheral Arterial Disease, Type II Diabetes, Osteomyelitis, Anorexia/bulimia 01/25/2020] [2:Artery Disease, Hypertension, Peripheral Arterial Disease, Type II Diabetes, Osteomyelitis, Anorexia/bulimia  01/25/2020] [3:Artery Disease, Hypertension, Peripheral  Arterial Disease, Type II Diabetes, Osteomyelitis, Anorexia/bulimia 01/25/2020] Date Acquired: [1:2] [2:2] [3:2] Weeks of Treatment: [1:Open] [2:Open] [3:Open] Wound Status: [1:0.3x0.3x0.1] [2:2.8x4.8x1.6] [3:6.1x8x0.1] Measurements L x W x D (cm) [1:0.071] [2:10.556] [3:38.327] A (cm) : rea [1:0.007] [2:16.889] [3:3.833] Volume (cm) : [1:81.20%] [2:25.30%] [3:-44.30%] % Reduction in A rea: [1:96.90%] [2:-8.60%] [3:-44.40%] % Reduction in Volume: [1:Grade 3] [2:Grade 2] [3:Unstageable/Unclassified] Classification: [1:Small] [2:Medium] [3:Medium] Exudate A mount: [1:Sanguinous] [2:Serosanguineous] [3:Serosanguineous] Exudate Type: [1:red] [2:red, brown] [3:red, brown] Exudate Color: [1:No] [2:Yes] [3:Yes] Foul Odor A Cleansing: [1:fter N/A] [2:No] [3:No] Odor A nticipated Due to Product Use: [1:Distinct, outline attached] [2:Epibole] [3:Flat and Intact] Wound Margin: [1:Large (67-100%)] [2:None Present (0%)] [3:None Present (0%)] Granulation A mount: [1:Red, Pink] [2:N/A] [3:N/A] Granulation  Quality: [1:None Present (0%)] [2:Large (67-100%)] [3:Large (67-100%)] Necrotic A mount: [1:N/A] [2:Eschar, Adherent Slough] [3:Eschar, Adherent Slough] Necrotic Tissue: [1:Fat Layer (Subcutaneous Tissue): Yes Fat Layer (Subcutaneous Tissue): Yes Fat Layer (Subcutaneous Tissue): Yes] Exposed Structures: [1:Fascia: No Tendon: No Muscle: No Joint: No Bone: No Medium (34-66%)] [2:Fascia: No Tendon: No Muscle: No Joint: No Bone: No None] [3:Fascia: No Tendon: No Muscle: No Joint: No Bone: No Small (1-33%)] Epithelialization: [1:N/A] [2:Debridement - Excisional] [3:N/A] Debridement: Pre-procedure Verification/Time Out N/A [2:11:40] [3:N/A] Taken: [1:N/A] [2:Lidocaine] [3:N/A] Pain Control: [1:N/A] [2:Subcutaneous, Slough] [3:N/A] Tissue Debrided: [1:N/A] [2:Skin/Subcutaneous Tissue] [3:N/A] Level: [1:N/A] [2:13.44] [3:N/A] Debridement A (sq cm): [1:rea N/A] [2:Blade, Forceps] [3:N/A] Instrument: [1:N/A] [2:Minimum] [3:N/A] Bleeding: [1:N/A] [2:Pressure] [3:N/A] Hemostasis A chieved: [1:N/A] [2:0] [3:N/A] Procedural Pain: [1:N/A] [2:0] [3:N/A] Post Procedural Pain: [1:N/A] [2:Procedure was tolerated well] [3:N/A] Debridement Treatment Response: [1:N/A] [2:2.8x4.8x1.6] [3:N/A] Post Debridement Measurements L x W x D (cm) [1:N/A] [2:16.889] [3:N/A] Post Debridement Volume: (cm) [1:N/A] [2:Debridement] [3:N/A] Wound Number: 4 N/A N/A Photos: No Photos N/A N/A Right Gluteus N/A N/A Wound Location: Pressure Injury N/A N/A Wounding Event: Pressure Ulcer N/A N/A Primary Etiology: Congestive Heart Failure, Coronary N/A N/A Comorbid History: Artery Disease, Hypertension, Peripheral Arterial Disease, Type II Diabetes, Osteomyelitis, Anorexia/bulimia 01/25/2020 N/A N/A Date Acquired: 2 N/A N/A Weeks of Treatment: Open N/A N/A Wound Status: 0.9x0.3x0.1 N/A N/A Measurements L x W x D (cm) 0.212 N/A N/A A (cm) : rea 0.021 N/A N/A Volume (cm) : 87.70% N/A N/A % Reduction in A  rea: 87.90% N/A N/A % Reduction in Volume: Category/Stage III N/A N/A Classification: Small N/A N/A Exudate A mount: Serosanguineous N/A N/A Exudate Type: red, brown N/A N/A Exudate Color: No N/A N/A Foul Odor A Cleansing: fter N/A N/A N/A Odor A nticipated Due to Product Use: Flat and Intact N/A N/A Wound Margin: Large (67-100%) N/A N/A Granulation A mount: Pink N/A N/A Granulation Quality: None Present (0%) N/A N/A Necrotic A mount: N/A N/A N/A Necrotic Tissue: Fat Layer (Subcutaneous Tissue): Yes N/A N/A Exposed Structures: Fascia: No Tendon: No Muscle: No Joint: No Bone: No Large (67-100%) N/A N/A Epithelialization: N/A N/A N/A Debridement: N/A N/A N/A Pain Control: N/A N/A N/A Tissue Debrided: N/A N/A N/A Level: N/A N/A N/A Debridement A (sq cm): rea N/A N/A N/A Instrument: N/A N/A N/A Bleeding: N/A N/A N/A Hemostasis Achieved: N/A N/A N/A Procedural Pain: N/A N/A N/A Post Procedural Pain: N/A N/A N/A Debridement Treatment Response: N/A N/A N/A Post Debridement Measurements L x W x D (cm) N/A N/A N/A Post Debridement Volume: (cm) N/A N/A N/A Procedures Performed: Treatment Notes Electronic Signature(s) Signed: 04/03/2020 4:37:05 PM By: Linton Ham MD Signed: 04/03/2020 5:05:22 PM By: Rhae Hammock RN Entered By: Linton Ham on 04/03/2020 12:38:54 --------------------------------------------------------------------------------  Multi-Disciplinary Care Plan Details Patient Name: Date of Service: Audrey, [name] 04/03/2020 10:45 A M Medical Record Number: [id number] Patient Account Number: [id number] Date of Birth/Sex: Treating RN: [birth year] (75 y.o. Female) Rhae Hammock Primary Care Barnet Benavides: Hendricks Limes Other Clinician: Referring Rohail Klees: Treating Marigny Borre/Extender: Mare Ferrari in Treatment: 2 Active Inactive Orientation to the Wound Care Program Nursing Diagnoses: Knowledge  deficit related to the wound healing center program Goals: Patient/caregiver will verbalize understanding of the [place] Date Initiated: 03/20/2020 Target Resolution Date: 03/30/2020 Goal Status: Active Interventions: Provide education on orientation to the wound center Notes: Wound/Skin Impairment Nursing Diagnoses: Impaired tissue integrity Goals: Patient will have a decrease in wound volume by X% from date: (specify in notes) Date Initiated: 03/20/2020 Target Resolution Date: 03/30/2020 Goal Status: Active Patient/caregiver will verbalize understanding of skin care regimen Date Initiated: 03/20/2020 Target Resolution Date: 03/30/2020 Goal Status: Active Ulcer/skin breakdown will have a volume reduction of 30% by week 4 Date Initiated: 03/20/2020 Target Resolution Date: 03/30/2020 Goal Status: Active Interventions: Assess patient/caregiver ability to obtain necessary supplies Assess patient/caregiver ability to perform ulcer/skin care regimen upon admission and as needed Assess ulceration(s) every visit Notes: Electronic Signature(s) Signed: 04/03/2020 5:05:22 PM By: Rhae Hammock RN Entered By: Rhae Hammock on 04/03/2020 11:53:49 -------------------------------------------------------------------------------- Pain Assessment Details Patient Name: Date of Service: [name] 04/03/2020 10:45 A M Medical Record Number: [id number] Patient Account Number: [id number] Date of Birth/Sex: Treating RN: [birth year] (75 y.o. Female) Baruch Gouty Primary Care Verdis Koval: Hendricks Limes Other Clinician: Referring Shanell Aden: Treating Merritt Kibby/Extender: Mare Ferrari in Treatment: 2 Active Problems Location of Pain Severity and Description of Pain Patient Has Paino Yes Site Locations Pain Location: Generalized Pain, Pain in Ulcers With Dressing Change: Yes Duration of the Pain. Constant / Intermittento Intermittent Rate the  pain. Current Pain Level: 8 Character of Pain Describe the Pain: Aching Pain Management and Medication Current Pain Management: Medication: Yes Other: reposition Is the Current Pain Management Adequate: Adequate How does your wound impact your activities of daily livingo Sleep: Yes Bathing: No Appetite: No Relationship With Others: No Bladder Continence: No Emotions: No Bowel Continence: No Hobbies: No Toileting: No Dressing: No Electronic Signature(s) Signed: 04/03/2020 5:13:50 PM By: Baruch Gouty RN, BSN Entered By: Baruch Gouty on 04/03/2020 10:47:04 -------------------------------------------------------------------------------- Patient/Caregiver Education Details Patient Name: Date of Service: [name] 2/8/2022andnbsp10:45 A M Medical Record Number: [id number] Patient Account Number: [id number] Date of Birth/Gender: Treating RN: 1945/07/12 (75 y.o. Female) Rhae Hammock Primary Care Physician: Hendricks Limes Other Clinician: Referring Physician: Treating Physician/Extender: Mare Ferrari in Treatment: 2 Education Assessment Education Provided To: Patient Education Topics Provided Welcome T [facility] [place]: o Methods: Explain/Verbal Responses: State content correctly Electronic Signature(s) Signed: 04/03/2020 5:05:22 PM By: Rhae Hammock RN Entered By: Rhae Hammock on 04/03/2020 11:56:09 -------------------------------------------------------------------------------- Wound Assessment Details Patient Name: Date of Service: [name] 04/03/2020 10:45 A M Medical Record Number: [id number] Patient Account Number: [id number] Date of Birth/Sex: Treating RN: 1946-02-10 (75 y.o. Female) Baruch Gouty Primary Care Clydell Sposito: Hendricks Limes Other Clinician: Referring Annella Prowell: Treating Zenaya Ulatowski/Extender: Malachy Moan Weeks in Treatment: 2 Wound Status Wound Number: 1  Primary Diabetic Wound/Ulcer of the Lower Extremity Etiology: Wound Location: Right T Great oe Wound Open Wounding Event: Gradually Appeared Status: Date Acquired: 01/25/2020 Comorbid Congestive Heart Failure, Coronary Artery Disease, Hypertension, Weeks Of Treatment: 2 History: Peripheral Arterial Disease, Type  II Diabetes, Osteomyelitis, Clustered Wound: No Anorexia/bulimia Photos Photo Uploaded By: Mikeal Hawthorne on 04/06/2020 15:01:21 Wound Measurements Length: (cm) 0.3 Width: (cm) 0.3 Depth: (cm) 0.1 Area: (cm) 0.071 Volume: (cm) 0.007 % Reduction in Area: 81.2% % Reduction in Volume: 96.9% Epithelialization: Medium (34-66%) Tunneling: No Undermining: No Wound Description Classification: Grade 3 Wound Margin: Distinct, outline attached Exudate Amount: Small Exudate Type: Sanguinous Exudate Color: red Foul Odor After Cleansing: No Slough/Fibrino No Wound Bed Granulation Amount: Large (67-100%) Exposed Structure Granulation Quality: Red, Pink Fascia Exposed: No Necrotic Amount: None Present (0%) Fat Layer (Subcutaneous Tissue) Exposed: Yes Tendon Exposed: No Muscle Exposed: No Joint Exposed: No Bone Exposed: No Treatment Notes Wound #1 (Toe Great) Wound Laterality: Right Cleanser Soap and Water Discharge Instruction: May shower and wash wound with dial antibacterial soap and water prior to dressing change. Wound Cleanser Discharge Instruction: Cleanse the wound with wound cleanser prior to applying a clean dressing using gauze sponges, not tissue or cotton balls. Peri-Wound Care Topical Primary Dressing KerraCel Ag Gelling Fiber Dressing, 4x5 in (silver alginate) Discharge Instruction: Apply silver alginate to wound bed as instructed Secondary Dressing Woven Gauze Sponges 2x2 in Discharge Instruction: Apply over primary dressing as directed. Secured With Conforming Stretch Gauze Bandage, Sterile 2x75 (in/in) Discharge Instruction: Secure with stretch  gauze as directed. Paper Tape, 1x10 (in/yd) Discharge Instruction: Secure dressing with tape as directed. Compression Wrap Compression Stockings Add-Ons Electronic Signature(s) Signed: 04/03/2020 5:13:50 PM By: Baruch Gouty RN, BSN Entered By: Baruch Gouty on 04/03/2020 11:01:21 -------------------------------------------------------------------------------- Wound Assessment Details Patient Name: Date of Service: [name] 04/03/2020 10:45 A M Medical Record Number: [id number] Patient Account Number: [id number] Date of Birth/Sex: Treating RN: [birth year] (75 y.o. Female) Baruch Gouty Primary Care Novice Vrba: Hendricks Limes Other Clinician: Referring Ardean Simonich: Treating Fredna Stricker/Extender: Mare Ferrari in Treatment: 2 Wound Status Wound Number: 2 Primary Diabetic Wound/Ulcer of the Lower Extremity Etiology: Wound Location: Right Calcaneus Wound Open Wounding Event: Pressure Injury Status: Date Acquired: 01/25/2020 Comorbid Congestive Heart Failure, Coronary Artery Disease, Hypertension, Weeks Of Treatment: 2 History: Peripheral Arterial Disease, Type II Diabetes, Osteomyelitis, Clustered Wound: No Anorexia/bulimia Photos Wound Measurements Length: (cm) 2.8 Width: (cm) 4.8 Depth: (cm) 1.6 Area: (cm) 10.556 Volume: (cm) 16.889 % Reduction in Area: 25.3% % Reduction in Volume: -8.6% Epithelialization: None Tunneling: No Undermining: No Wound Description Classification: Grade 2 Wound Margin: Epibole Exudate Amount: Medium Exudate Type: Serosanguineous Exudate Color: red, brown Foul Odor After Cleansing: Yes Due to Product Use: No Slough/Fibrino Yes Wound Bed Granulation Amount: None Present (0%) Exposed Structure Necrotic Amount: Large (67-100%) Fascia Exposed: No Necrotic Quality: Eschar, Adherent Slough Fat Layer (Subcutaneous Tissue) Exposed: Yes Tendon Exposed: No Muscle Exposed: No Joint Exposed: No Bone Exposed:  No Treatment Notes Wound #2 (Calcaneus) Wound Laterality: Right Cleanser Soap and Water Discharge Instruction: May shower and wash wound with dial antibacterial soap and water prior to dressing change. Wound Cleanser Discharge Instruction: Cleanse the wound with wound cleanser prior to applying a clean dressing using gauze sponges, not tissue or cotton balls. Peri-Wound Care Zinc Oxide Ointment 30g tube Discharge Instruction: Apply Zinc Oxide to periwound with each dressing change Topical Primary Dressing KerraCel Ag Gelling Fiber Dressing, 4x5 in (silver alginate) Discharge Instruction: Apply silver alginate to wound bed as instructed Secondary Dressing Woven Gauze Sponge, Non-Sterile 4x4 in Discharge Instruction: Apply over primary dressing as directed. ABD Pad, 5x9 Discharge Instruction: Apply over primary dressing as directed. ALLEVYN Heel 4 1/2in x 5 1/2in /  10.5cm x 13.5cm Discharge Instruction: Apply over primary dressing as directed. Secured With [employer], 4.5x3.1 (in/yd) Discharge Instruction: Secure with Kerlix as directed. Paper Tape, 1x10 (in/yd) Discharge Instruction: Secure dressing with tape as directed. netting Discharge Instruction: may use netting over the kerlix Compression Wrap Compression Stockings Add-Ons Electronic Signature(s) Signed: 04/06/2020 3:11:05 PM By: Mikeal Hawthorne EMT/HBOT/SD Signed: 04/06/2020 5:49:38 PM By: Baruch Gouty RN, BSN Previous Signature: 04/03/2020 5:13:50 PM Version By: Baruch Gouty RN, BSN Entered By: Mikeal Hawthorne on 04/06/2020 15:02:23 -------------------------------------------------------------------------------- Wound Assessment Details Patient Name: Date of Service: [name] 04/03/2020 10:45 A M Medical Record Number: [id number] Patient Account Number: [id number] Date of Birth/Sex: Treating RN: 10-Jan-1946 (75 y.o. Female) Baruch Gouty Primary Care Nelly Scriven: Hendricks Limes Other  Clinician: Referring Bernetha Anschutz: Treating Hollyann Pablo/Extender: Mare Ferrari in Treatment: 2 Wound Status Wound Number: 3 Primary Pressure Ulcer Etiology: Wound Location: Sacrum Wound Open Wounding Event: Pressure Injury Status: Date Acquired: 01/25/2020 Comorbid Congestive Heart Failure, Coronary Artery Disease, Hypertension, Weeks Of Treatment: 2 History: Peripheral Arterial Disease, Type II Diabetes, Osteomyelitis, Clustered Wound: No Anorexia/bulimia Photos Photo Uploaded By: Mikeal Hawthorne on 04/06/2020 15:03:08 Wound Measurements Length: (cm) 6.1 Width: (cm) 8 Depth: (cm) 0.1 Area: (cm) 38.327 Volume: (cm) 3.833 % Reduction in Area: -44.3% % Reduction in Volume: -44.4% Epithelialization: Small (1-33%) Tunneling: No Undermining: No Wound Description Classification: Unstageable/Unclassified Wound Margin: Flat and Intact Exudate Amount: Medium Exudate Type: Serosanguineous Exudate Color: red, brown Foul Odor After Cleansing: Yes Due to Product Use: No Slough/Fibrino Yes Wound Bed Granulation Amount: None Present (0%) Exposed Structure Necrotic Amount: Large (67-100%) Fascia Exposed: No Necrotic Quality: Eschar, Adherent Slough Fat Layer (Subcutaneous Tissue) Exposed: Yes Tendon Exposed: No Muscle Exposed: No Joint Exposed: No Bone Exposed: No Treatment Notes Wound #3 (Sacrum) Cleanser Soap and Water Discharge Instruction: May shower and wash wound with dial antibacterial soap and water prior to dressing change. Wound Cleanser Discharge Instruction: Cleanse the wound with wound cleanser prior to applying a clean dressing using gauze sponges, not tissue or cotton balls. Peri-Wound Care Skin Prep Discharge Instruction: Use skin prep as directed Topical Primary Dressing Santyl Ointment Discharge Instruction: Apply nickel thick amount to wound bed as instructed Secondary Dressing Woven Gauze Sponge, Non-Sterile 4x4 in Discharge  Instruction: Apply over primary dressing as directed. ComfortFoam Border, 6x6 in (silicone border) Discharge Instruction: Apply over primary dressing as directed. Secured With Compression Wrap Compression Stockings Environmental education officer) Signed: 04/03/2020 5:13:50 PM By: Baruch Gouty RN, BSN Entered By: Baruch Gouty on 04/03/2020 11:02:44 -------------------------------------------------------------------------------- Wound Assessment Details Patient Name: Date of Service: [name] 04/03/2020 10:45 A M Medical Record Number: [id number] Patient Account Number: [id number] Date of Birth/Sex: Treating RN: [birth year] (75 y.o. Female) Baruch Gouty Primary Care Shanae Luo: Hendricks Limes Other Clinician: Referring Tinnie Kunin: Treating Nakai Yard/Extender: Mare Ferrari in Treatment: 2 Wound Status Wound Number: 4 Primary Pressure Ulcer Etiology: Wound Location: Right Gluteus Wound Open Wounding Event: Pressure Injury Status: Date Acquired: 01/25/2020 Comorbid Congestive Heart Failure, Coronary Artery Disease, Hypertension, Weeks Of Treatment: 2 History: Peripheral Arterial Disease, Type II Diabetes, Osteomyelitis, Clustered Wound: No Anorexia/bulimia Photos Photo Uploaded By: Mikeal Hawthorne on 04/06/2020 15:03:09 Wound Measurements Length: (cm) 0.9 Width: (cm) 0.3 Depth: (cm) 0.1 Area: (cm) 0.212 Volume: (cm) 0.021 % Reduction in Area: 87.7% % Reduction in Volume: 87.9% Epithelialization: Large (67-100%) Tunneling: No Undermining: No Wound Description Classification: Category/Stage III Wound Margin: Flat and Intact Exudate Amount: Small Exudate Type:  Serosanguineous Exudate Color: red, brown Foul Odor After Cleansing: No Slough/Fibrino Yes Wound Bed Granulation Amount: Large (67-100%) Exposed Structure Granulation Quality: Pink Fascia Exposed: No Necrotic Amount: None Present (0%) Fat Layer (Subcutaneous Tissue)  Exposed: Yes Tendon Exposed: No Muscle Exposed: No Joint Exposed: No Bone Exposed: No Treatment Notes Wound #4 (Gluteus) Wound Laterality: Right Cleanser Soap and Water Discharge Instruction: May shower and wash wound with dial antibacterial soap and water prior to dressing change. Wound Cleanser Discharge Instruction: Cleanse the wound with wound cleanser prior to applying a clean dressing using gauze sponges, not tissue or cotton balls. Peri-Wound Care Skin Prep Discharge Instruction: Use skin prep as directed Topical Primary Dressing Santyl Ointment Discharge Instruction: Apply nickel thick amount to wound bed as instructed Secondary Dressing Woven Gauze Sponge, Non-Sterile 4x4 in Discharge Instruction: Apply over primary dressing as directed. ComfortFoam Border, 6x6 in (silicone border) Discharge Instruction: Apply over primary dressing as directed. Secured With Compression Wrap Compression Stockings Environmental education officer) Signed: 04/03/2020 5:13:50 PM By: Baruch Gouty RN, BSN Signed: 04/03/2020 5:13:50 PM By: Baruch Gouty RN, BSN Entered By: Baruch Gouty on 04/03/2020 11:02:21 -------------------------------------------------------------------------------- Vitals Details Patient Name: Date of Service: [name] 04/03/2020 10:45 A M Medical Record Number: [id number] Patient Account Number: [id number] Date of Birth/Sex: Treating RN: 1945-06-25 (75 y.o. Female) Baruch Gouty Primary Care Adream Parzych: Hendricks Limes Other Clinician: Referring Mikaila Grunert: Treating Cranford Blessinger/Extender: Mare Ferrari in Treatment: 2 Vital Signs Time Taken: 10:45 Temperature (F): 99 Height (in): 66 Pulse (bpm): 73 Source: Stated Respiratory Rate (breaths/min): 18 Weight (lbs): 165 Blood Pressure (mmHg): 119/78 Source: Stated Reference Range: 80 - 120 mg / dl Body Mass Index (BMI): 26.6 Electronic Signature(s) Signed: 04/03/2020  5:13:50 PM By: Baruch Gouty RN, BSN Entered By: Baruch Gouty on 04/03/2020 10:46:15

## 2020-04-03 NOTE — Progress Notes (Signed)
Audrey, [name] ([id number]) Visit Report for 04/03/2020 Debridement Details Patient Name: Date of Service: [name], [name] 04/03/2020 10:45 A M Medical Record Number: [id number] Patient Account Number: [id number] Date of Birth/Sex: Treating RN: 1945/09/29 (75 y.o. Female) Rhae Hammock Primary Care Provider: Hendricks Limes Other Clinician: Referring Provider: Treating Provider/Extender: Mare Ferrari in Treatment: 2 Debridement Performed for Assessment: Wound #2 Right Calcaneus Performed By: Physician Ricard Dillon., MD Debridement Type: Debridement Severity of Tissue Pre Debridement: Fat layer exposed Level of Consciousness (Pre-procedure): Awake and Alert Pre-procedure Verification/Time Out Yes - 11:40 Taken: Start Time: 11:40 Pain Control: Lidocaine T Area Debrided (L x W): otal 2.8 (cm) x 4.8 (cm) = 13.44 (cm) Tissue and other material debrided: Viable, Non-Viable, Slough, Subcutaneous, Skin: Dermis , Skin: Epidermis, Slough Level: Skin/Subcutaneous Tissue Debridement Description: Excisional Instrument: Blade, Forceps Bleeding: Minimum Hemostasis Achieved: Pressure End Time: 11:41 Procedural Pain: 0 Post Procedural Pain: 0 Response to Treatment: Procedure was tolerated well Level of Consciousness (Post- Awake and Alert procedure): Post Debridement Measurements of Total Wound Length: (cm) 2.8 Width: (cm) 4.8 Depth: (cm) 1.6 Volume: (cm) 16.889 Character of Wound/Ulcer Post Debridement: Improved Severity of Tissue Post Debridement: Fat layer exposed Post Procedure Diagnosis Same as Pre-procedure Electronic Signature(s) Signed: 04/03/2020 4:37:05 PM By: Linton Ham MD Signed: 04/03/2020 5:05:22 PM By: Rhae Hammock RN Entered By: Linton Ham on 04/03/2020 12:39:06 -------------------------------------------------------------------------------- HPI Details Patient Name: Date of Service: [name]. 04/03/2020  10:45 A M Medical Record Number: [id number] Patient Account Number: [id number] Date of Birth/Sex: Treating RN: 18-May-1945 (75 y.o. Female) Rhae Hammock Primary Care Provider: Hendricks Limes Other Clinician: Referring Provider: Treating Provider/Extender: Mare Ferrari in Treatment: 2 History of Present Illness HPI Description: Admission 03/20/2020 This is a frail 75 year old woman who is a chronic resident of [facility]. She is here for review of multiple areas including the sacrum, right buttock, right heel and tip of the right great toe. Tracing this back this seems to have started in September. She started being followed by Dr. Cannon Kettle of podiatry at that point having an area on the right great toe as well as documented osteomyelitis of the right first toe. On November 4 she saw Dr. Stanford Breed of vein and vascular. Noninvasive studies at that point showed noncompressible ABIs monophasic waveforms and a TBI in the left of 0.7. Right could not be done because of bandaging. She underwent an ANGIOGRAM on 01/04/2020. This showed a 75% focal stenosis of the common femoral artery. No hemodynamically significant stenosis in the SFA or popliteal artery. All tibial arteries were occluded at their origin but there was reconstitution of the anterior tibial and peroneal artery at the ankle. She received an angioplasty of the anterior tibial artery with inline flow to the foot. A plain x-ray on 02/08/2020 showed progressive bony destructions involving the distal phalanx of the right great toe. At her last visit with Dr. Cannon Kettle on 03/01/2020 she had dry eschar on the right heel as well as a wound on the left heel. The right first toe had dry eschar with hyperkeratotic tissue around it. She has a follow-up with vascular study on 08/28/2020 but Dr. Luan Pulling notes suggest she was not a candidate for any further intervention. She comes in today with an area on the right  first toe. This probes to bone I think they have been using Betadine and silver alginate. Necrotic eschar with significant malodor on the right heel. There  is nothing open on the left heel. She has a stage III wound on the sacrum and a an unstageable area on the right gluteal fold just above which almost looks like a linear brief pressure area. 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 She lives at [facility] 04/03/2020; this is a patient I admitted to the clinic 2 weeks ago. She had necrotic wounds on her right heel, sacrum. She had additional wounds on the right first toe and right buttock. We use silver alginate to all these wounds. Culture I did last time showed Morganella. I started her on cefdinir 300 twice daily for 10 days which she is still taking with doxycycline. I am presuming the longstanding on doxycycline is for osteomyelitis of the right first toe. She arrives in clinic today with malodor of the right heel and right sacrum reported by our intake nurse. Electronic Signature(s) Signed: 04/03/2020 4:37:05 PM By: Linton Ham MD Entered By: Linton Ham on 04/03/2020 12:43:12 -------------------------------------------------------------------------------- Physical Exam Details Patient Name: Date of Service: [name] 04/03/2020 10:45 A M Medical Record Number: [id number] Patient Account Number: [id number] Date of Birth/Sex: Treating RN: January 30, 1946 (75 y.o. Female) Rhae Hammock Primary Care Provider: Hendricks Limes Other Clinician: Referring Provider: Treating Provider/Extender: Mare Ferrari in Treatment: 2 Constitutional Sitting or standing Blood Pressure is within target range for patient.. Pulse regular and within target range for  patient.Marland Kitchen Respirations regular, non-labored and within target range.. Temperature is normal and within the target range for the patient.Marland Kitchen Appears in no distress. Notes Wound exam The tip of the toe looks somewhat better. I cannot identify probing the bone The right heel was necrotic and I removed necrotic tissue probably mostly fat and subcutaneous tissue to try and clean up the surface for appropriate dressing. There was an odor I did not repeat the culture. Working my way up I then looked at the sacrum which was a completely necrotic wound with surrounding erythema and tenderness. Extreme malodor. The area on the right gluteal fold actually looks somewhat better. Electronic Signature(s) Signed: 04/03/2020 4:37:05 PM By: Linton Ham MD Entered By: Linton Ham on 04/03/2020 12:44:49 -------------------------------------------------------------------------------- Physician Orders Details Patient Name: Date of Service: [name] 04/03/2020 10:45 A M Medical Record Number: [id number] Patient Account Number: [id number] Date of Birth/Sex: Treating RN: [birth year] (75 y.o. Female) Rhae Hammock Primary Care Provider: Hendricks Limes Other Clinician: Referring Provider: Treating Provider/Extender: Mare Ferrari in Treatment: 2 Verbal / Phone Orders: No Diagnosis Coding Follow-up Appointments Return Appointment in 2 weeks. Bathing/ Shower/ Hygiene May shower and wash wound with soap and water. - may wash on days the wound dressing is to be changed. Dial antibacterial soap is a good soap to use while washing the feet. Feet should not be soaked in anything, only washed and then thoroughly rinsed. Edema Control - Lymphedema / SCD / Other Elevate legs to the level of the heart or above for 30 minutes daily and/or when sitting, a frequency of: Avoid standing for long periods of time. Off-Loading Turn and reposition every 2 hours Other: - Keep  pressure off of heels using the bunny boot or multipodus boots. Additional Orders / Instructions Other: - Pt. being sent to ED for possible OR debridement and IV antibiotics for infection. Wound Treatment Wound #1 -  T Great oe Wound Laterality: Right Cleanser: Soap and Water Every Other Day/15 Days Discharge Instructions: May shower and wash wound with dial antibacterial soap and water prior to dressing change. Cleanser: Wound Cleanser Every Other Day/15 Days Discharge Instructions: Cleanse the wound with wound cleanser prior to applying a clean dressing using gauze sponges, not tissue or cotton balls. Prim Dressing: KerraCel Ag Gelling Fiber Dressing, 4x5 in (silver alginate) Every Other Day/15 Days ary Discharge Instructions: Apply silver alginate to wound bed as instructed Secondary Dressing: Woven Gauze Sponges 2x2 in Every Other Day/15 Days Discharge Instructions: Apply over primary dressing as directed. Secured With: Child psychotherapist, Sterile 2x75 (in/in) Every Other Day/15 Days Discharge Instructions: Secure with stretch gauze as directed. Secured With: Paper Tape, 1x10 (in/yd) Every Other Day/15 Days Discharge Instructions: Secure dressing with tape as directed. Wound #2 - Calcaneus Wound Laterality: Right Cleanser: Soap and Water Every Other Day/15 Days Discharge Instructions: May shower and wash wound with dial antibacterial soap and water prior to dressing change. Cleanser: Wound Cleanser Every Other Day/15 Days Discharge Instructions: Cleanse the wound with wound cleanser prior to applying a clean dressing using gauze sponges, not tissue or cotton balls. Peri-Wound Care: Zinc Oxide Ointment 30g tube Every Other Day/15 Days Discharge Instructions: Apply Zinc Oxide to periwound with each dressing change Prim Dressing: KerraCel Ag Gelling Fiber Dressing, 4x5 in (silver alginate) Every Other Day/15 Days ary Discharge Instructions: Apply silver alginate to wound bed  as instructed Secondary Dressing: Woven Gauze Sponge, Non-Sterile 4x4 in Every Other Day/15 Days Discharge Instructions: Apply over primary dressing as directed. Secondary Dressing: ABD Pad, 5x9 Every Other Day/15 Days Discharge Instructions: Apply over primary dressing as directed. Secondary Dressing: ALLEVYN Heel 4 1/2in x 5 1/2in / 10.5cm x 13.5cm Every Other Day/15 Days Discharge Instructions: Apply over primary dressing as directed. Secured With: [employer], 4.5x3.1 (in/yd) Every Other Day/15 Days Discharge Instructions: Secure with Kerlix as directed. Secured With: Paper Tape, 1x10 (in/yd) Every Other Day/15 Days Discharge Instructions: Secure dressing with tape as directed. Secured With: netting Every Other Day/15 Days Discharge Instructions: may use netting over the kerlix Wound #3 - Sacrum Cleanser: Soap and Water 1 x Per Day/15 Days Discharge Instructions: May shower and wash wound with dial antibacterial soap and water prior to dressing change. Cleanser: Wound Cleanser 1 x Per Day/15 Days Discharge Instructions: Cleanse the wound with wound cleanser prior to applying a clean dressing using gauze sponges, not tissue or cotton balls. Peri-Wound Care: Skin Prep 1 x Per Day/15 Days Discharge Instructions: Use skin prep as directed Prim Dressing: Santyl Ointment 1 x Per Day/15 Days ary Discharge Instructions: Apply nickel thick amount to wound bed as instructed Secondary Dressing: Woven Gauze Sponge, Non-Sterile 4x4 in 1 x Per Day/15 Days Discharge Instructions: Apply over primary dressing as directed. Secondary Dressing: ComfortFoam Border, 6x6 in (silicone border) 1 x Per Day/15 Days Discharge Instructions: Apply over primary dressing as directed. Wound #4 - Gluteus Wound Laterality: Right Cleanser: Soap and Water 1 x Per GUY/40 Days Discharge Instructions: May shower and wash wound with dial antibacterial soap and water prior to dressing change. Cleanser: Wound  Cleanser 1 x Per Day/15 Days Discharge Instructions: Cleanse the wound with wound cleanser prior to applying a clean dressing using gauze sponges, not tissue or cotton balls. Peri-Wound Care: Skin Prep 1 x Per Day/15 Days Discharge Instructions: Use skin prep as directed Prim Dressing: Santyl Ointment 1 x Per Day/15 Days ary Discharge Instructions: Apply nickel  thick amount to wound bed as instructed Secondary Dressing: Woven Gauze Sponge, Non-Sterile 4x4 in 1 x Per Day/15 Days Discharge Instructions: Apply over primary dressing as directed. Secondary Dressing: ComfortFoam Border, 6x6 in (silicone border) 1 x Per Day/15 Days Discharge Instructions: Apply over primary dressing as directed. Electronic Signature(s) Signed: 04/03/2020 4:37:05 PM By: Linton Ham MD Signed: 04/03/2020 5:05:22 PM By: Rhae Hammock RN Entered By: Rhae Hammock on 04/03/2020 11:53:41 -------------------------------------------------------------------------------- Problem List Details Patient Name: Date of Service: [name] 04/03/2020 10:45 A M Medical Record Number: [id number] Patient Account Number: [id number] Date of Birth/Sex: Treating RN: 18-Dec-1945 (75 y.o. Female) Rhae Hammock Primary Care Provider: Hendricks Limes Other Clinician: Referring Provider: Treating Provider/Extender: Mare Ferrari in Treatment: 2 Active Problems ICD-10 Encounter Code Description Active Date MDM Diagnosis E11.621 Type 2 diabetes mellitus with foot ulcer 03/20/2020 No Yes E11.51 Type 2 diabetes mellitus with diabetic peripheral angiopathy without gangrene 03/20/2020 No Yes M86.671 Other chronic osteomyelitis, right ankle and foot 03/20/2020 No Yes L97.518 Non-pressure chronic ulcer of other part of right foot with other specified 03/20/2020 No Yes severity L97.418 Non-pressure chronic ulcer of right heel and midfoot with other specified 03/20/2020 No Yes severity L89.153  Pressure ulcer of sacral region, stage 3 03/20/2020 No Yes L89.313 Pressure ulcer of right buttock, stage 3 03/20/2020 No Yes Inactive Problems Resolved Problems Electronic Signature(s) Signed: 04/03/2020 4:37:05 PM By: Linton Ham MD Entered By: Linton Ham on 04/03/2020 12:38:46 -------------------------------------------------------------------------------- Progress Note Details Patient Name: Date of Service: [name] 04/03/2020 10:45 A M Medical Record Number: [id number] Patient Account Number: [id number] Date of Birth/Sex: Treating RN: 1945-10-11 (75 y.o. Female) Rhae Hammock Primary Care Provider: Hendricks Limes Other Clinician: Referring Provider: Treating Provider/Extender: Mare Ferrari in Treatment: 2 Subjective History of Present Illness (HPI) Admission 03/20/2020 This is a frail 75 year old woman who is a chronic resident of [facility]. She is here for review of multiple areas including the sacrum, right buttock, right heel and tip of the right great toe. Tracing this back this seems to have started in September. She started being followed by Dr. Cannon Kettle of podiatry at that point having an area on the right great toe as well as documented osteomyelitis of the right first toe. On November 4 she saw Dr. Stanford Breed of vein and vascular. Noninvasive studies at that point showed noncompressible ABIs monophasic waveforms and a TBI in the left of 0.7. Right could not be done because of bandaging. She underwent an ANGIOGRAM on 01/04/2020. This showed a 75% focal stenosis of the common femoral artery. No hemodynamically significant stenosis in the SFA or popliteal artery. All tibial arteries were occluded at their origin but there was reconstitution of the anterior tibial and peroneal artery at the ankle. She received an angioplasty of the anterior tibial artery with inline flow to the foot. A plain x-ray on 02/08/2020  showed progressive bony destructions involving the distal phalanx of the right great toe. At her last visit with Dr. Cannon Kettle on 03/01/2020 she had dry eschar on the right heel as well as a wound on the left heel. The right first toe had dry eschar with hyperkeratotic tissue around it. She has a follow-up with vascular study on 08/28/2020 but Dr. Luan Pulling notes suggest she was not a candidate for any further intervention. She comes in today with an area on the right first toe. This probes to bone I think they have been using Betadine  and silver alginate. Necrotic eschar with significant malodor on the right heel. There is nothing open on the left heel. She has a stage III wound on the sacrum and a an unstageable area on the right gluteal fold just above which almost looks like a linear brief pressure area. 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 She lives at [facility] skilled facility 04/03/2020; this is a patient I admitted to the clinic 2 weeks ago. She had necrotic wounds on her right heel, sacrum. She had additional wounds on the right first toe and right buttock. We use silver alginate to all these wounds. Culture I did last time showed Morganella. I started her on cefdinir 300 twice daily for 10 days which she is still taking with doxycycline. I am presuming the longstanding on doxycycline is for osteomyelitis of the right first toe. She arrives in clinic today with malodor of the right heel and right sacrum reported by our intake nurse. Objective Constitutional Sitting or standing Blood Pressure is within target range for patient.. Pulse regular and within target range for patient.Marland Kitchen Respirations regular, non-labored and within target range.. Temperature is normal and within the target range for the patient.Marland Kitchen Appears  in no distress. Vitals Time Taken: 10:45 AM, Height: 66 in, Source: Stated, Weight: 165 lbs, Source: Stated, BMI: 26.6, Temperature: 99 F, Pulse: 73 bpm, Respiratory Rate: 18 breaths/min, Blood Pressure: 119/78 mmHg. General Notes: Wound exam ooThe tip of the toe looks somewhat better. I cannot identify probing the bone ooThe right heel was necrotic and I removed necrotic tissue probably mostly fat and subcutaneous tissue to try and clean up the surface for appropriate dressing. There was an odor I did not repeat the culture. ooWorking my way up I then looked at the sacrum which was a completely necrotic wound with surrounding erythema and tenderness. Extreme malodor. ooThe area on the right gluteal fold actually looks somewhat better. Integumentary (Hair, Skin) Wound #1 status is Open. Original cause of wound was Gradually Appeared. The wound is located on the Right T Great. The wound measures 0.3cm length x oe 0.3cm width x 0.1cm depth; 0.071cm^2 area and 0.007cm^3 volume. There is Fat Layer (Subcutaneous Tissue) exposed. There is no tunneling or undermining noted. There is a small amount of sanguinous drainage noted. The wound margin is distinct with the outline attached to the wound base. There is large (67- 100%) red, pink granulation within the wound bed. There is no necrotic tissue within the wound bed. Wound #2 status is Open. Original cause of wound was Pressure Injury. The wound is located on the Right Calcaneus. The wound measures 2.8cm length x 4.8cm width x 1.6cm depth; 10.556cm^2 area and 16.889cm^3 volume. There is Fat Layer (Subcutaneous Tissue) exposed. There is no tunneling or undermining noted. There is a medium amount of serosanguineous drainage noted. Foul odor after cleansing was noted. The wound margin is epibole. There is no granulation within the wound bed. There is a large (67-100%) amount of necrotic tissue within the wound bed including Eschar and Adherent  Slough. Wound #3 status is Open. Original cause of wound was Pressure Injury. The wound is located on the Sacrum. The wound measures 6.1cm length x 8cm width x 0.1cm depth; 38.327cm^2 area and 3.833cm^3 volume. There is Fat Layer (Subcutaneous Tissue) exposed. There is no tunneling or undermining noted. There is a  medium amount of serosanguineous drainage noted. Foul odor after cleansing was noted. The wound margin is flat and intact. There is no granulation within the wound bed. There is a large (67-100%) amount of necrotic tissue within the wound bed including Eschar and Adherent Slough. Wound #4 status is Open. Original cause of wound was Pressure Injury. The wound is located on the Right Gluteus. The wound measures 0.9cm length x 0.3cm width x 0.1cm depth; 0.212cm^2 area and 0.021cm^3 volume. There is Fat Layer (Subcutaneous Tissue) exposed. There is no tunneling or undermining noted. There is a small amount of serosanguineous drainage noted. The wound margin is flat and intact. There is large (67-100%) pink granulation within the wound bed. There is no necrotic tissue within the wound bed. Assessment Active Problems ICD-10 Type 2 diabetes mellitus with foot ulcer Type 2 diabetes mellitus with diabetic peripheral angiopathy without gangrene Other chronic osteomyelitis, right ankle and foot Non-pressure chronic ulcer of other part of right foot with other specified severity Non-pressure chronic ulcer of right heel and midfoot with other specified severity Pressure ulcer of sacral region, stage 3 Pressure ulcer of right buttock, stage 3 Procedures Wound #2 Pre-procedure diagnosis of Wound #2 is a Diabetic Wound/Ulcer of the Lower Extremity located on the Right Calcaneus .Severity of Tissue Pre Debridement is: Fat layer exposed. There was a Excisional Skin/Subcutaneous Tissue Debridement with a total area of 13.44 sq cm performed by Ricard Dillon., MD. With the following instrument(s):  Blade, and Forceps to remove Viable and Non-Viable tissue/material. Material removed includes Subcutaneous Tissue, Slough, Skin: Dermis, and Skin: Epidermis after achieving pain control using Lidocaine. No specimens were taken. A time out was conducted at 11:40, prior to the start of the procedure. A Minimum amount of bleeding was controlled with Pressure. The procedure was tolerated well with a pain level of 0 throughout and a pain level of 0 following the procedure. Post Debridement Measurements: 2.8cm length x 4.8cm width x 1.6cm depth; 16.889cm^3 volume. Character of Wound/Ulcer Post Debridement is improved. Severity of Tissue Post Debridement is: Fat layer exposed. Post procedure Diagnosis Wound #2: Same as Pre-Procedure Plan Follow-up Appointments: Return Appointment in 2 weeks. Bathing/ Shower/ Hygiene: May shower and wash wound with soap and water. - may wash on days the wound dressing is to be changed. Dial antibacterial soap is a good soap to use while washing the feet. Feet should not be soaked in anything, only washed and then thoroughly rinsed. Edema Control - Lymphedema / SCD / Other: Elevate legs to the level of the heart or above for 30 minutes daily and/or when sitting, a frequency of: Avoid standing for long periods of time. Off-Loading: Turn and reposition every 2 hours Other: - Keep pressure off of heels using the bunny boot or multipodus boots. Additional Orders / Instructions: Other: - Pt. being sent to ED for possible OR debridement and IV antibiotics for infection. WOUND #1: - T Great Wound Laterality: Right oe Cleanser: Soap and Water Every Other Day/15 Days Discharge Instructions: May shower and wash wound with dial antibacterial soap and water prior to dressing change. Cleanser: Wound Cleanser Every Other Day/15 Days Discharge Instructions: Cleanse the wound with wound cleanser prior to applying a clean dressing using gauze sponges, not tissue or cotton  balls. Prim Dressing: KerraCel Ag Gelling Fiber Dressing, 4x5 in (silver alginate) Every Other Day/15 Days ary Discharge Instructions: Apply silver alginate to wound bed as instructed Secondary Dressing: Woven Gauze Sponges 2x2 in Every Other Day/15 Days Discharge  Instructions: Apply over primary dressing as directed. Secured With: Child psychotherapist, Sterile 2x75 (in/in) Every Other Day/15 Days Discharge Instructions: Secure with stretch gauze as directed. Secured With: Paper T ape, 1x10 (in/yd) Every Other Day/15 Days Discharge Instructions: Secure dressing with tape as directed. WOUND #2: - Calcaneus Wound Laterality: Right Cleanser: Soap and Water Every Other Day/15 Days Discharge Instructions: May shower and wash wound with dial antibacterial soap and water prior to dressing change. Cleanser: Wound Cleanser Every Other Day/15 Days Discharge Instructions: Cleanse the wound with wound cleanser prior to applying a clean dressing using gauze sponges, not tissue or cotton balls. Peri-Wound Care: Zinc Oxide Ointment 30g tube Every Other Day/15 Days Discharge Instructions: Apply Zinc Oxide to periwound with each dressing change Prim Dressing: KerraCel Ag Gelling Fiber Dressing, 4x5 in (silver alginate) Every Other Day/15 Days ary Discharge Instructions: Apply silver alginate to wound bed as instructed Secondary Dressing: Woven Gauze Sponge, Non-Sterile 4x4 in Every Other Day/15 Days Discharge Instructions: Apply over primary dressing as directed. Secondary Dressing: ABD Pad, 5x9 Every Other Day/15 Days Discharge Instructions: Apply over primary dressing as directed. Secondary Dressing: ALLEVYN Heel 4 1/2in x 5 1/2in / 10.5cm x 13.5cm Every Other Day/15 Days Discharge Instructions: Apply over primary dressing as directed. Secured With: [employer], 4.5x3.1 (in/yd) Every Other Day/15 Days Discharge Instructions: Secure with Kerlix as directed. Secured With: Paper T ape,  1x10 (in/yd) Every Other Day/15 Days Discharge Instructions: Secure dressing with tape as directed. Secured With: netting Every Other Day/15 Days Discharge Instructions: may use netting over the kerlix WOUND #3: - Sacrum Wound Laterality: Cleanser: Soap and Water 1 x Per LYY/50 Days Discharge Instructions: May shower and wash wound with dial antibacterial soap and water prior to dressing change. Cleanser: Wound Cleanser 1 x Per Day/15 Days Discharge Instructions: Cleanse the wound with wound cleanser prior to applying a clean dressing using gauze sponges, not tissue or cotton balls. Peri-Wound Care: Skin Prep 1 x Per Day/15 Days Discharge Instructions: Use skin prep as directed Prim Dressing: Santyl Ointment 1 x Per Day/15 Days ary Discharge Instructions: Apply nickel thick amount to wound bed as instructed Secondary Dressing: Woven Gauze Sponge, Non-Sterile 4x4 in 1 x Per Day/15 Days Discharge Instructions: Apply over primary dressing as directed. Secondary Dressing: ComfortFoam Border, 6x6 in (silicone border) 1 x Per Day/15 Days Discharge Instructions: Apply over primary dressing as directed. WOUND #4: - Gluteus Wound Laterality: Right Cleanser: Soap and Water 1 x Per PTW/65 Days Discharge Instructions: May shower and wash wound with dial antibacterial soap and water prior to dressing change. Cleanser: Wound Cleanser 1 x Per Day/15 Days Discharge Instructions: Cleanse the wound with wound cleanser prior to applying a clean dressing using gauze sponges, not tissue or cotton balls. Peri-Wound Care: Skin Prep 1 x Per Day/15 Days Discharge Instructions: Use skin prep as directed Prim Dressing: Santyl Ointment 1 x Per Day/15 Days ary Discharge Instructions: Apply nickel thick amount to wound bed as instructed Secondary Dressing: Woven Gauze Sponge, Non-Sterile 4x4 in 1 x Per Day/15 Days Discharge Instructions: Apply over primary dressing as directed. Secondary Dressing: ComfortFoam  Border, 6x6 in (silicone border) 1 x Per Day/15 Days Discharge Instructions: Apply over primary dressing as directed. 1. Unfortunately had already debrided her heel by the time I looked at her sacrum. 2. I think this patient's sacral wound is infected with progressive surrounding soft tissue infection. This would have occurred while she is on cefdinir [prescribed for the Morganella I cultured  out of the right heel 2 weeks ago] as well as doxycycline. I did not think this could be managed as an outpatient. I think she will need IV antibiotics surgical debridement and cultures towards that end I sent her to the ER 3. We contacted her family to update them Electronic Signature(s) Signed: 04/03/2020 4:37:05 PM By: Linton Ham MD Entered By: Linton Ham on 04/03/2020 12:46:12 -------------------------------------------------------------------------------- SuperBill Details Patient Name: Date of Service: [name] 04/03/2020 Medical Record Number: [id number] Patient Account Number: [id number] Date of Birth/Sex: Treating RN: May 01, 1945 (75 y.o. Female) Rhae Hammock Primary Care Provider: Hendricks Limes Other Clinician: Referring Provider: Treating Provider/Extender: Mare Ferrari in Treatment: 2 Diagnosis Coding ICD-10 Codes Code Description [id number] Type 2 diabetes mellitus with foot ulcer E11.51 Type 2 diabetes mellitus with diabetic peripheral angiopathy without gangrene M86.671 Other chronic osteomyelitis, right ankle and foot L97.518 Non-pressure chronic ulcer of other part of right foot with other specified severity L97.418 Non-pressure chronic ulcer of right heel and midfoot with other specified severity L89.153 Pressure ulcer of sacral region, stage 3 L89.313 Pressure ulcer of right buttock, stage 3 Facility Procedures CPT4 Code: [id number] Description: 39688 - DEB SUBQ TISSUE 20 SQ CM/< ICD-10 Diagnosis Description L97.418 Non-pressure  chronic ulcer of right heel and midfoot with other specified seve Modifier: rity Quantity: 1 Physician Procedures : CPT4 Code Description Modifier [id number] 11042 - WC PHYS SUBQ TISS 20 SQ CM ICD-10 Diagnosis Description L97.418 Non-pressure chronic ulcer of right heel and midfoot with other specified severity Quantity: 1 Electronic Signature(s) Signed: 04/03/2020 4:37:05 PM By: Linton Ham MD Entered By: Linton Ham on 04/03/2020 12:46:26

## 2020-04-03 NOTE — ED Provider Notes (Addendum)
[facility] DEPT Provider Note   CSN: [id number] Arrival date & time: 04/03/20  1214     History Chief Complaint  Patient presents with  . Wound Infection    [name] is a 75 y.o. female.  HPI Patient sent in from wound care with Dr. Dellia Nims.  Has necrotic wounds of sacrum and right heel.  Both have been on oral antibiotics.  Reportedly will require IV antibiotics and likely surgical debridement.  Patient states overall she feels fine.  Does have some buttock pain.  Worse with movement.  Denies fevers or chills.  Has been on cefdinir 300 mg 2 times a day.  Patient had heel culture from just over week ago positive for [facility] MORGANII with some resistances.        Past Medical History:  Diagnosis Date  . Acute on chronic diastolic CHF (congestive heart failure) ([place]) 06/22/2017  . Acute on chronic renal failure ([place]) 05/25/2011  . Adenocarcinoma of breast ([place]) 1996   Completed tamoxifen and had mastectomy.  . Aortic stenosis, severe    s/p aortic valve replacement with porcine valve 06/2004.  ECHO 2010 EF 90%, LVH, diastolic dysfxn, Bioprostetic aoritc valve, mild AS. ECHO 2013 EF 60%, Nl aortic artificial valve, dynamic obstruction in the outflow tract   Class IIb rec for annual TTE after 5 yrs. She had a TTE 2013    . Arthritis   . CAD (coronary artery disease) 2006   s/p CABG (5/06) w/ saphenous vein to RCA at time of AVR  . CKD (chronic kidney disease) stage 2, GFR 60-89 ml/min 06/03/2011   She is no longer taking NSAIDs.   [place] Kitchen CVA (cerebral infarction) 2006   Post-op from AVR. Presumed embolic in nature. Carotid stenosis of R 60-79%. Repeat dopplers 4/10 no R stenosis and L stenosis of 1-29%.  . Depression    Controlled on Paxil  . Diabetes mellitus 1992   Dx 04/25/1990. Now insulin dependent, started 2008. On ACEI.   . Diverticulosis 2001  . History of 2019 novel coronavirus disease (COVID-19)   . Hyperlipidemia    Mgmt with a statin   . Hypertension    Requires 4 drug tx  . Osteoporosis 2006   DEXA 10/06 : L femur T -2.8, R -2.7. Lumbar T -2.4. On bisphosphonates and  Calcium / Vit D.  . Peripheral vascular disease ([place])   . Presence of permanent cardiac pacemaker   . Refusal of blood transfusions as patient is Jehovah's Witness   . Stroke [facility])     Patient Active Problem List   Diagnosis Date Noted  . Osteomyelitis of toe ([place]) 02/22/2020  . Bleeding 01/27/2020  . CKD (chronic kidney disease) stage 3, GFR 30-59 ml/min (HCC) 01/27/2020  . Critical lower limb ischemia ([place]) 01/04/2020  . Skin pustule 10/20/2019  . Neurocognitive deficits 08/04/2019  . Controlled type 2 diabetes mellitus without complication ([place]) 24/10/7351  . CVA (cerebral vascular accident) ([place]) 07/23/2019  . Infection of pacemaker lead wire ([place]) 07/21/2019  . Fever   . History of transcatheter aortic valve replacement (TAVR)   . Septic encephalopathy 07/18/2019  . Sacral decubitus ulcer, stage II ([place]) 07/18/2019  . Bacteremia due to group B Streptococcus 07/18/2019  . Sepsis ([place]) 07/18/2019  . Complete heart block ([place])   . Chronic diastolic congestive heart failure ([place]) 02/07/2019  . AKI (acute kidney injury) ([place]) 07/22/2017  . Peripheral arterial disease ([place]) 06/18/2017  . B12 deficiency 05/01/2017  . Bilateral lower  extremity edema 11/08/2015  . Aortic atherosclerosis ([place]) 06/07/2014  . Abnormality of gait 05/29/2012  . Constipation 03/16/2012  . HOCM (hypertrophic obstructive cardiomyopathy) ([place]) 06/11/2011  . Routine health maintenance 06/10/2010  . Hyperlipidemia   . Refusal of blood transfusions as patient is Jehovah's Witness   . History of adenocarcinoma of breast   . Osteoporosis   . Status post CVA   . Aortic stenosis s/p Tissure AVR 2006   . CAD (coronary artery disease)   . Hypertension   . Depression 01/23/2006  . DM (diabetes mellitus) ([place]) 03/25/1990  . Benign hypertensive heart and kidney disease with  diastolic CHF, NYHA class 3 and CKD stage 3 ([place]) 1992  . Bradycardia 1992    Past Surgical History:  Procedure Laterality Date  . ABDOMINAL HYSTERECTOMY  1987   for fibroids  . AORTIC VALVE REPLACEMENT  2006  . CHOLECYSTECTOMY    . CORONARY ARTERY BYPASS GRAFT  2006   Saphenous vein to RCA at time of AVR. Course complicated by acute respiratory failure, post-op PTX, ARI, ileus, CVA  . LOWER EXTREMITY ANGIOGRAPHY N/A 01/04/2020   Procedure: LOWER EXTREMITY ANGIOGRAPHY - Right;  Surgeon: Cherre Robins, MD;  Location: [facility];  Service: Cardiovascular;  Laterality: N/A;  . MASTECTOMY Left 1995   L for adenocarcinoma  . PACEMAKER IMPLANT N/A 02/09/2019   Procedure: PACEMAKER IMPLANT;  Surgeon: Constance Haw, MD;  Location: [facility];  Service: Cardiovascular;  Laterality: N/A;  . PERIPHERAL VASCULAR BALLOON ANGIOPLASTY  01/04/2020   Procedure: PERIPHERAL VASCULAR BALLOON ANGIOPLASTY;  Surgeon: Cherre Robins, MD;  Location: [facility];  Service: Cardiovascular;;  Rt AT  . RIGHT HEART CATH AND CORONARY/GRAFT ANGIOGRAPHY N/A 02/09/2019   Procedure: RIGHT HEART CATH AND CORONARY/GRAFT ANGIOGRAPHY;  Surgeon: Sherren Mocha, MD;  Location: [facility];  Service: Cardiovascular;  Laterality: N/A;  . TEE WITHOUT CARDIOVERSION N/A 07/20/2019   Procedure: TRANSESOPHAGEAL ECHOCARDIOGRAM (TEE);  Surgeon: Jerline Pain, MD;  Location: [facility] ENDOSCOPY;  Service: Cardiovascular;  Laterality: N/A;  . TRANSCATHETER AORTIC VALVE REPLACEMENT, TRANSFEMORAL N/A 03/01/2019   Procedure: TRANSCATHETER AORTIC VALVE REPLACEMENT, TRANSFEMORAL;  Surgeon: Sherren Mocha, MD;  Location: [facility];  Service: Open Heart Surgery;  Laterality: N/A;     OB History   No obstetric history on file.     Family History  Problem Relation Age of Onset  . Diabetes Mother   . Hypertension Mother   . Alzheimer's disease Mother   . Heart disease Father 64       AMI at age  59 and 54  . Mental illness Sister   . Heart disease Sister 68       AMI  . Kidney disease Sister     Social History   Tobacco Use  . Smoking status: Never Smoker  . Smokeless tobacco: Never Used  Vaping Use  . Vaping Use: Never used  Substance Use Topics  . Alcohol use: Yes    Alcohol/week: 0.0 standard drinks    Comment: Occasional beer, monthly  . Drug use: No    Home Medications Prior to Admission medications   Medication Sig Start Date End Date Taking? Authorizing Provider  acetaminophen (TYLENOL) 325 MG tablet Take 650 mg by mouth daily. Not to exceed 3000 mg in 24 hour per stranding order 07/28/19  Yes [provider]  Amino Acids-Protein Hydrolys (FEEDING SUPPLEMENT, PRO-STAT SUGAR FREE 64,) LIQD Take 30 mLs by mouth in the  morning and at bedtime.   Yes [provider]  amoxicillin (AMOXIL) 500 MG capsule Take 2,000 mg by mouth once. As needed prior to dental procedure   Yes [provider]  aspirin 81 MG EC tablet TAKE 1 TABLET (81 MG TOTAL) BY MOUTH DAILY. SWALLOW WHOLE. Patient taking differently: Take 81 mg by mouth daily. 09/25/18  Yes Aslam, Loralyn Freshwater, MD  atorvastatin (LIPITOR) 80 MG tablet Take 1 tablet (80 mg total) by mouth at bedtime. 07/08/19  Yes [name], MD  cefdinir (OMNICEF) 300 MG capsule Take 300 mg by mouth 2 (two) times daily. 03/27/20  Yes [provider]  clopidogrel (PLAVIX) 75 MG tablet Take 1 tablet (75 mg total) by mouth daily with breakfast. Patient taking differently: Take 75 mg by mouth daily. 06/16/19  Yes [name], MD  diclofenac Sodium (VOLTAREN) 1 % GEL Apply 4 g topically 4 (four) times daily as needed (Pain). Apply to bilateral hips and knees   Yes [provider]  DULoxetine (CYMBALTA) 30 MG capsule Take 30 mg by mouth daily.   Yes [provider]  Ensure (ENSURE) Take 237 mLs by mouth in the morning and at bedtime. Strawberry if available   Yes [provider]   gabapentin (NEURONTIN) 100 MG capsule Take 100 mg by mouth at bedtime.   Yes [provider]  hydrALAZINE (APRESOLINE) 25 MG tablet Take 25 mg by mouth 2 (two) times daily. Hold if blood pressure < 110   Yes [provider]  lactulose (CHRONULAC) 10 GM/15ML solution Take 20 g by mouth daily. 10/17/19  Yes [provider]  magnesium hydroxide (MILK OF MAGNESIA) 400 MG/5ML suspension Take 30 mLs by mouth daily as needed for mild constipation.   Yes [provider]  melatonin 3 MG TABS tablet Take 6 mg by mouth every evening.   Yes [provider]  metoprolol tartrate (LOPRESSOR) 25 MG tablet Take 12.5 mg by mouth 2 (two) times daily. 03/23/20  Yes [provider]  mirtazapine (REMERON) 15 MG tablet Take 7.5 mg by mouth at bedtime. 03/31/20  Yes [provider]  Multiple Vitamins-Minerals (DECUBI-VITE) CAPS Take 1 capsule by mouth daily.   Yes [provider]  NOVOLOG FLEXPEN 100 UNIT/ML FlexPen Inject 5 Units into the skin 2 (two) times daily. cbg >/= 150 03/27/20  Yes [provider]  Nutritional Supplement LIQD Take 120 mLs by mouth 2 (two) times daily. MedPass   Yes [provider]  Nutritional Supplements (NUTRITIONAL DRINK PO) Take 1 Container by mouth in the morning, at noon, and at bedtime. Magic cup   Yes [provider]  ondansetron (ZOFRAN) 4 MG tablet Take 4 mg by mouth every 12 (twelve) hours as needed for nausea or vomiting. 03/23/20  Yes [provider]  saccharomyces boulardii (FLORASTOR) 250 MG capsule Take 250 mg by mouth 2 (two) times daily.   Yes [provider]  white petrolatum (VASELINE) GEL Apply 1 application topically daily. Heels at bedtime   Yes [provider]  cefTRIAXone (ROCEPHIN) 1 g injection Inject 1 g into the muscle daily.    [provider]  Insulin Degludec-Liraglutide (XULTOPHY) 100-3.6 UNIT-MG/ML SOPN Inject 10 Units into the skin  daily.    [provider]  metoprolol succinate (TOPROL-XL) 25 MG 24 hr tablet Take 1 tablet (25 mg total) by mouth daily. Patient not taking: Reported on 04/03/2020 09/19/19   Angelica Pou, MD    Allergies  Other  Review of Systems   Review of Systems  Constitutional: Positive for appetite change and fatigue. Negative for fever.  HENT: Negative for congestion.   Respiratory: Negative for shortness of breath.   Cardiovascular: Negative for chest pain.  Gastrointestinal: Negative for abdominal pain.  Genitourinary: Negative for flank pain.  Musculoskeletal: Positive for gait problem.  Skin: Positive for wound.  Neurological: Positive for weakness.  Psychiatric/Behavioral: Negative for confusion.    Physical Exam Updated Vital Signs BP (!) 118/58   Pulse 76   Temp 98.1 F (36.7 C) (Oral)   Resp 18   SpO2 100%   Physical Exam Vitals and nursing note reviewed.  HENT:     Head: Normocephalic.     Mouth/Throat:     Mouth: Mucous membranes are moist.  Eyes:     Pupils: Pupils are equal, round, and reactive to light.  Cardiovascular:     Rate and Rhythm: Regular rhythm.  Pulmonary:     Breath sounds: No wheezing or rhonchi.  Abdominal:     Tenderness: There is no abdominal tenderness.  Genitourinary:    Comments: Necrotic wound on the sacrum.  No purulence.  Some surrounding erythema. Musculoskeletal:     Cervical back: Neck supple.     Comments: Necrotic wound on right heel.  Wound ulcer on tip of right great toe.  Skin:    General: Skin is warm.     Capillary Refill: Capillary refill takes less than 2 seconds.  Neurological:     Mental Status: She is alert and oriented to person, place, and time.     Comments: Patient is awake answers questions.           ED Results / Procedures / Treatments   Labs (all labs ordered are listed, but only abnormal results are displayed) Labs Reviewed  COMPREHENSIVE METABOLIC PANEL - Abnormal; Notable for  the following components:      Result Value   Sodium 132 (*)    CO2 21 (*)    Glucose, Bld 188 (*)    BUN 90 (*)    Creatinine, Ser 2.20 (*)    Calcium 8.1 (*)    Total Protein 5.8 (*)    Albumin 1.7 (*)    Alkaline Phosphatase 142 (*)    GFR, Estimated 23 (*)    All other components within normal limits  CBC WITH DIFFERENTIAL/PLATELET - Abnormal; Notable for the following components:   WBC 19.8 (*)    RBC 3.07 (*)    Hemoglobin 9.5 (*)    HCT 29.1 (*)    RDW 17.1 (*)    Neutro Abs 15.9 (*)    Monocytes Absolute 1.4 (*)    Abs Immature Granulocytes 0.13 (*)    All other components within normal limits  RESP PANEL BY RT-PCR (FLU A&B, COVID) ARPGX2  CULTURE, BLOOD (ROUTINE X 2)  CULTURE, BLOOD (ROUTINE X 2)  LACTIC ACID, PLASMA  LACTIC ACID, PLASMA    EKG None  Radiology DG Sacrum/Coccyx  Result Date: 04/03/2020 CLINICAL DATA:  Sacral wound EXAM: SACRUM AND COCCYX - 2+ VIEW COMPARISON:  October 06, 2005 FINDINGS: Frontal, angled frontal, and lateral views were obtained. There is no fracture or diastasis. No erosive change or bony destruction evident. There is osteoarthritic change in each sacroiliac joint. No soft tissue air evident. IMPRESSION: No bony destruction. No fracture or diastasis. Osteoarthritic change in each sacroiliac joint noted. Electronically Signed   By: Lowella Grip III M.D.   On: 04/03/2020  14:36   DG Foot Complete Right  Result Date: 04/03/2020 CLINICAL DATA:  Pain EXAM: RIGHT FOOT COMPLETE - 3+ VIEW COMPARISON:  February 08, 2020 FINDINGS: Frontal, oblique, and lateral views were obtained. Portions of the mid to distal aspects of the first distal phalanx are absent, likely due to chronic bony destruction. There is a focal area of apparent bony destruction along the lateral aspect of the distal portion of the remaining first distal phalanx, concerning for active osteomyelitis in this area. No other bony destruction seen. Prior fractures of the proximal  aspect of the second proximal phalanx and distal aspect of the third proximal phalanx appear stable. No new fracture evident. No dislocation. Bones are osteoporotic. Joint spaces appear unremarkable. There is widespread arterial vascular calcification. IMPRESSION: Apparent osteomyelitis involving a portion of the first distal phalanx. Note that much of this bone has undergone previous resorption, likely due to chronic osteomyelitis change. Diffuse osteoporosis. Prior fractures of the proximal aspect of the second proximal phalanx and distal aspect of the third proximal phalanx. No acute fracture or dislocation. Widespread arterial vascular calcification noted. Electronically Signed   By: Lowella Grip III M.D.   On: 04/03/2020 14:40    Procedures Procedures   Medications Ordered in ED Medications  collagenase (SANTYL) ointment (has no administration in time range)  piperacillin-tazobactam (ZOSYN) IVPB 3.375 g (3.375 g Intravenous New Bag/Given 04/03/20 1430)  lactated ringers bolus 1,000 mL (1,000 mLs Intravenous New Bag/Given 04/03/20 1542)    ED Course  I have reviewed the triage vital signs and the nursing notes.  Pertinent labs & imaging results that were available during my care of the patient were reviewed by me and considered in my medical decision making (see chart for details).    MDM Rules/Calculators/A&P                          Patient sent in for decubitus ulcer and infection of heel and sacral area.  Wound care physician sent in for IV antibiotics and may be debridement.  Mild hypotension but reviewing records patient has not been eating and drinking and patient states she is had no appetite.  No fever.  White count is elevated.  X-ray of the sacral area done and reassuring.  However potentially has osteomyelitis of the toe, which she has had previously.  X-ray of the heel also reassuring.  Has seen both vascular surgery and podiatry for her foot previously.  General surgery has  seen patient for the sacral ulcer.  No acute plans for surgery but hold Plavix in case the need intervention.  Will discuss with hospitalist for admission. At this point patient has been difficult IV access.  Has IV access but difficulty getting cultures.  Will not delay IV antibiotics or other cultures at this point.  Creatinine mildly elevated but reviewing records has not been eating and drinking I think this easily could be the cause.  Afebrile.  Zosyn given has had IV fluids.  Will admit to hospitalist.  Discussed with Dr. Marylyn Ishihara.  Will admit patient.  However requested I consult either vascular surgery or podiatry to see the patient regarding the foot.  I have discussed with podiatry, who will consult.   Final Clinical Impression(s) / ED Diagnoses Final diagnoses:  Pressure injury of skin of sacral region, unspecified injury stage  Wound infection  Pressure injury of skin of right heel, unspecified injury stage  Toe osteomyelitis ([place])    Rx /  DC Orders ED Discharge Orders    None       Davonna Belling, MD 04/03/20 0029    Davonna Belling, MD 04/03/20 [id number]

## 2020-04-03 NOTE — ED Notes (Addendum)
Pt arrived from [place] w soiled brief. Pt changed and purewick was placed on pt at 85mmHg.

## 2020-04-03 NOTE — Telephone Encounter (Signed)
Dr. Marylyn Ishihara is requesting consult. contact [phone]  Patient is being admitted into hospital with osteo infection in the right foot.

## 2020-04-04 DIAGNOSIS — L89159 Pressure ulcer of sacral region, unspecified stage: Secondary | ICD-10-CM | POA: Diagnosis not present

## 2020-04-04 LAB — COMPREHENSIVE METABOLIC PANEL
ALT: 19 U/L (ref 0–44)
AST: 17 U/L (ref 15–41)
Albumin: 1.7 g/dL — ABNORMAL LOW (ref 3.5–5.0)
Alkaline Phosphatase: 134 U/L — ABNORMAL HIGH (ref 38–126)
Anion gap: 13 (ref 5–15)
BUN: 94 mg/dL — ABNORMAL HIGH (ref 8–23)
CO2: 20 mmol/L — ABNORMAL LOW (ref 22–32)
Calcium: 8.2 mg/dL — ABNORMAL LOW (ref 8.9–10.3)
Chloride: 100 mmol/L (ref 98–111)
Creatinine, Ser: 2.29 mg/dL — ABNORMAL HIGH (ref 0.44–1.00)
GFR, Estimated: 22 mL/min — ABNORMAL LOW (ref >60)
Glucose, Bld: 142 mg/dL — ABNORMAL HIGH (ref 70–99)
Potassium: 4.7 mmol/L (ref 3.5–5.1)
Sodium: 133 mmol/L — ABNORMAL LOW (ref 135–145)
Total Bilirubin: 0.7 mg/dL (ref 0.3–1.2)
Total Protein: 6.1 g/dL — ABNORMAL LOW (ref 6.5–8.1)

## 2020-04-04 LAB — CBC
HCT: 27.9 % — ABNORMAL LOW (ref 36.0–46.0)
Hemoglobin: 9.3 g/dL — ABNORMAL LOW (ref 12.0–15.0)
MCH: 31.1 pg (ref 26.0–34.0)
MCHC: 33.3 g/dL (ref 30.0–36.0)
MCV: 93.3 fL (ref 80.0–100.0)
Platelets: 286 10*3/uL (ref 150–400)
RBC: 2.99 MIL/uL — ABNORMAL LOW (ref 3.87–5.11)
RDW: 16.9 % — ABNORMAL HIGH (ref 11.5–15.5)
WBC: 22.6 10*3/uL — ABNORMAL HIGH (ref 4.0–10.5)
nRBC: 0 % (ref 0.0–0.2)

## 2020-04-04 LAB — HEMOGLOBIN A1C
Hgb A1c MFr Bld: 5.8 % — ABNORMAL HIGH (ref 4.8–5.6)
Mean Plasma Glucose: 119.76 mg/dL

## 2020-04-04 LAB — CBG MONITORING, ED
Glucose-Capillary: 113 mg/dL — ABNORMAL HIGH (ref 70–99)
Glucose-Capillary: 115 mg/dL — ABNORMAL HIGH (ref 70–99)
Glucose-Capillary: 134 mg/dL — ABNORMAL HIGH (ref 70–99)

## 2020-04-04 LAB — GAMMA GT: GGT: 29 U/L (ref 7–50)

## 2020-04-04 MED ORDER — GABAPENTIN 100 MG PO CAPS
100.0000 mg | ORAL_CAPSULE | Freq: Every day | ORAL | Status: DC
  Administered 2020-04-04 – 2020-04-07 (×4): 100 mg via ORAL
  Filled 2020-04-04 (×4): qty 1

## 2020-04-04 MED ORDER — METOPROLOL TARTRATE 25 MG PO TABS
12.5000 mg | ORAL_TABLET | Freq: Two times a day (BID) | ORAL | Status: DC
  Administered 2020-04-04 (×2): 12.5 mg via ORAL
  Filled 2020-04-04 (×3): qty 1

## 2020-04-04 MED ORDER — ACETAMINOPHEN 650 MG RE SUPP
650.0000 mg | Freq: Four times a day (QID) | RECTAL | Status: DC | PRN
  Filled 2020-04-04: qty 1

## 2020-04-04 MED ORDER — SACCHAROMYCES BOULARDII 250 MG PO CAPS
250.0000 mg | ORAL_CAPSULE | Freq: Two times a day (BID) | ORAL | Status: DC
  Administered 2020-04-04 – 2020-04-11 (×15): 250 mg via ORAL
  Filled 2020-04-04 (×16): qty 1

## 2020-04-04 MED ORDER — MIRTAZAPINE 15 MG PO TABS
7.5000 mg | ORAL_TABLET | Freq: Every day | ORAL | Status: DC
  Administered 2020-04-04 – 2020-04-05 (×2): 7.5 mg via ORAL
  Filled 2020-04-04 (×2): qty 1

## 2020-04-04 MED ORDER — INSULIN ASPART 100 UNIT/ML ~~LOC~~ SOLN
0.0000 [IU] | Freq: Three times a day (TID) | SUBCUTANEOUS | Status: DC
  Administered 2020-04-04: 2 [IU] via SUBCUTANEOUS
  Filled 2020-04-04: qty 0.15

## 2020-04-04 MED ORDER — HYDROCODONE-ACETAMINOPHEN 5-325 MG PO TABS
1.0000 | ORAL_TABLET | ORAL | Status: DC | PRN
  Administered 2020-04-05: 2 via ORAL
  Filled 2020-04-04: qty 2

## 2020-04-04 MED ORDER — MELATONIN 3 MG PO TABS
6.0000 mg | ORAL_TABLET | Freq: Every evening | ORAL | Status: DC
  Administered 2020-04-04 – 2020-04-07 (×3): 6 mg via ORAL
  Filled 2020-04-04 (×3): qty 2

## 2020-04-04 MED ORDER — HEPARIN SODIUM (PORCINE) 5000 UNIT/ML IJ SOLN
5000.0000 [IU] | Freq: Three times a day (TID) | INTRAMUSCULAR | Status: DC
  Administered 2020-04-04 – 2020-04-10 (×18): 5000 [IU] via SUBCUTANEOUS
  Filled 2020-04-04 (×18): qty 1

## 2020-04-04 MED ORDER — CLOPIDOGREL BISULFATE 75 MG PO TABS: 75.0000 mg | ORAL_TABLET | Freq: Every day | ORAL | Status: DC

## 2020-04-04 MED ORDER — ASPIRIN EC 81 MG PO TBEC
81.0000 mg | DELAYED_RELEASE_TABLET | Freq: Every day | ORAL | Status: DC
  Administered 2020-04-04 – 2020-04-11 (×8): 81 mg via ORAL
  Filled 2020-04-04 (×8): qty 1

## 2020-04-04 MED ORDER — METRONIDAZOLE 500 MG PO TABS: 500.0000 mg | ORAL_TABLET | Freq: Three times a day (TID) | ORAL | Status: DC

## 2020-04-04 MED ORDER — DULOXETINE HCL 30 MG PO CPEP
30.0000 mg | ORAL_CAPSULE | Freq: Every day | ORAL | Status: DC
  Administered 2020-04-04 – 2020-04-08 (×5): 30 mg via ORAL
  Filled 2020-04-04 (×5): qty 1

## 2020-04-04 MED ORDER — ATORVASTATIN CALCIUM 40 MG PO TABS
80.0000 mg | ORAL_TABLET | Freq: Every day | ORAL | Status: DC
  Administered 2020-04-04 – 2020-04-10 (×7): 80 mg via ORAL
  Filled 2020-04-04 (×7): qty 2

## 2020-04-04 MED ORDER — ONDANSETRON HCL 4 MG PO TABS: 4.0000 mg | ORAL_TABLET | Freq: Two times a day (BID) | ORAL | Status: DC | PRN

## 2020-04-04 MED ORDER — ACETAMINOPHEN 325 MG PO TABS
650.0000 mg | ORAL_TABLET | Freq: Four times a day (QID) | ORAL | Status: DC | PRN
  Administered 2020-04-06 – 2020-04-11 (×4): 650 mg via ORAL
  Filled 2020-04-04 (×5): qty 2

## 2020-04-04 MED ORDER — ADULT MULTIVITAMIN W/MINERALS CH
1.0000 | ORAL_TABLET | Freq: Every day | ORAL | Status: DC
  Administered 2020-04-04 – 2020-04-11 (×8): 1 via ORAL
  Filled 2020-04-04 (×8): qty 1

## 2020-04-04 MED ORDER — SODIUM CHLORIDE 0.9 % IV SOLN: INTRAVENOUS | Status: DC

## 2020-04-04 MED ORDER — SODIUM CHLORIDE 0.9 % IV SOLN
500.0000 mg | INTRAVENOUS | Status: DC
  Administered 2020-04-04 – 2020-04-11 (×8): 500 mg via INTRAVENOUS
  Filled 2020-04-04 (×10): qty 0.5

## 2020-04-04 MED ORDER — LACTULOSE 10 GM/15ML PO SOLN
20.0000 g | Freq: Every day | ORAL | Status: DC
  Administered 2020-04-04 – 2020-04-11 (×5): 20 g via ORAL
  Filled 2020-04-04 (×7): qty 30

## 2020-04-04 MED ORDER — SODIUM CHLORIDE 0.9 % IV SOLN: 2.0000 g | INTRAVENOUS | Status: DC

## 2020-04-04 NOTE — Progress Notes (Addendum)
Subjective: [name] is an 75 y.o. female.  HPI: 75 y.o.femalewith medical history significant ofCAD, CABG, AS s/p TAVR, DM2, CKD 4, chronic diastolic HF, HLD, HTN, Breast CA s/p mastectomy.  I saw her today in the emergency department still awaiting a bed.  She was tired today and slept during the majority of the exam today.  Her daughter was at bedside.  Objective: NAD Overall exam remains unchanged.  Large ulceration to the posterior aspect the right heel with fibrogranular base without any probing to bone, undermining tunneling.  There is no surrounding erythema, ascending cellulitis.  No fluctuance or irritation.  There is no malodor.   Superficial wound on the right hallux with minimal edema.  No erythema or warmth. No pain with calf compression, swelling, warmth, erythema  Assessment: 74 year old female osteomyelitis, ulcerations right foot  Plan: Patient was sleeping during today's exam but her daughter was at bedside.  Reviewed the CT findings with her which did reveal osteomyelitis.  White blood cell count elevated today to 22.6.  She is afebrile but she has been hypotensive.   Although the CT is concerning for osteomyelitis clinically the foot does not have significant cellulitis or warmth.  I discussed with the daughter possible surgical debridement, bone biopsy of the wound and possible synthetic graft application.  The patient's daughter is agreeable to proceed with surgery at this time if needed  We will possibly plan for doing this Thursday versus Friday and will discuss with Dr. Cannon Kettle. I am concerned about her undergoing surgery. Also, I believe that given her circulation status it is going to be hard to heal these ulcers.   Celesta Gentile, DPM

## 2020-04-04 NOTE — Progress Notes (Addendum)
   04/04/20 1350  Hydrotherapy Evaluation.  Subjective Assessment  Subjective my foot hurts, I want to roll over  Patient and Family Stated Goals agreed to wound care, get better  Date of Onset  (present on adm.)  Prior Treatments unsure.  Pressure Injury 04/04/20 Sacrum Medial Unstageable - Full thickness tissue loss in which the base of the injury is covered by slough (yellow, tan, gray, green or brown) and/or eschar (tan, brown or black) in the wound bed. Physical therapy only-  sacral   Date First Assessed/Time First Assessed: 04/04/20 1030   Location: Sacrum  Location Orientation: Medial  Staging: Unstageable - Full thickness tissue loss in which the base of the injury is covered by slough (yellow, tan, gray, green or brown) and/or ...  Dressing Type ABD;Gauze (Comment);Moist to moist;Santyl;Silicone dressing  Dressing Changed  Dressing Change Frequency Daily  Site / Wound Assessment Granulation tissue;Friable  % Wound base Red or Granulating 0%  % Wound base Black/Eschar 100%  Wound Length (cm) 6 cm  Wound Width (cm) 7 cm  Wound Depth (cm) 1 cm (after debridement)  Wound Surface Area (cm^2) 42 cm^2  Wound Volume (cm^3) 42 cm^3  Margins Unattached edges (unapproximated)  Drainage Amount Moderate  Drainage Description Odor;Purulent;Serosanguineous  Treatment Debridement (Selective);Hydrotherapy (Pulse lavage);Packing (Saline gauze)  Hydrotherapy  Pulsed Lavage with Suction (psi) 12 psi  Pulsed Lavage with Suction - Normal Saline Used 1000 mL  Pulsed Lavage Tip Tip with splash shield  Pulsed lavage therapy - wound location sacral  Selective Debridement  Selective Debridement - Location sacral  Selective Debridement - Tools Used Forceps;Scissors  Selective Debridement - Tissue Removed dark eschar  Wound Therapy - Assess/Plan/Recommendations  Wound Therapy - Clinical Statement Patient presents sacral wound with soft dark eschar, malodorous, inferior  peri wound border appears  maceration with white dermis and pink under. Patient tolerated well. Patient comes from LTC SNF, is bed bound for several weeks per patient. Patient has right heel wound/osteo being followed by podiatry. Patient will benefit from PT for hydrotherapy to decrease necrotic and increase granulation, improve qualitiy of wound and minimize bioburden effects to sacrum.  Patient will benefit from low air loss mattress.  Wound Therapy - Functional Problem List bed bound, total care  Factors Delaying/Impairing Wound Healing Diabetes Mellitus;Incontinence  Hydrotherapy Plan Debridement;Dressing change;Patient/family education;Pulsatile lavage with suction  Wound Therapy - Frequency 6X / week  Wound Therapy - Current Recommendations Case manager/social work  Wound Therapy - Follow Up Recommendations Skilled nursing facility  Wound Plan PLS , debridement, dressing changes  Wound Therapy Goals - Improve the function of patient's integumentary system by progressing the wound(s) through the phases of wound healing by:  Decrease Necrotic Tissue to 50  Decrease Necrotic Tissue - Progress Goal set today  Increase Granulation Tissue to 50  Increase Granulation Tissue - Progress Goal set today  Patient/Family will be able to  direct pressure relief from sacrum  Patient/Family Instruction Goal - Progress Goal set today  Goals/treatment plan/discharge plan were made with and agreed upon by patient/family Yes  Time For Goal Achievement 2 weeks  Wound Therapy - Potential for Goals Fair  [place] Pager [phone] Office [phone]

## 2020-04-04 NOTE — Progress Notes (Signed)
PROGRESS NOTE   [name]  JFH:[id number] DOB: 26-Mar-1945 DOA: 04/03/2020 PCP: Hendricks Limes, MD  Brief Narrative:  75 year old female skilled living facility resident-wheelchair-bound at baseline critical right lower extremity ischemia status post angiography recanalization A NT tibial artery-balloon angioplasty on aspirin Plavix Aortic stenosis status post TAVR 03/01/2019 for, aortic valve repair 2006 HFpEF + CHB + PPM (placed 01/2019)-bacteremia group B strep 06/2019 status post PPM explantation and then subsequent [employer] Micra AV Prior coronavirus infection 06/2018 DM TY 2 [employer] Witness CKD stage III Prior CVA Breast cancer status postmastectomy and chemotherapy  Presented from [facility] has been treated in the outpatient setting for osteomyelitis of the right great toe with doxycycline--on follow-up evaluation found to have acute kidney injury and was started on saline in the outpatient setting-found to have sacral decubitus and right heel ulcer Culture showed right heel ulcer was positive for Morganella and had been changed to cefdinir  Found to have mild hypotension white count elevated to 19.8 General surgery saw the patient for sacral ulcer Podiatry saw the patient in addition-patient was placed on Invanz CT scan was ordered  Assessment & Plan:   Active Problems:   Open wound of heel   1. Osteomyelitis of right heel-failed outpatient therapy on multiple agents a. CT scan 2/8 reveals first proximal distal phalangeal osteomyelitis?  Calcaneal osteomyelitis-deferring to podiatrist plan for possible debridement versus need for amputation b. Received Zosyn on admission-switched to Invanz c. Received bolus of IV fluids-changed to IV fluid '@75'  cc/H 2. Sacral decubitus ulcer a. General surgery recommends hydrotherapy as per PT-nonoperative candidate b. Santyl as needed c. Wounds to be reviewed when hydrotherapy is performed 3. HOCM + bradycardia a. Metoprolol as  below-holding hydralazine for now 4. AoS status post TAVR a. Plavix held-aspirin continued b. Continue metoprolol 12.5 twice daily 5. AKI on admission superimposed on CKD 3B a. Saline 75 cc/H-avoid nephrotoxins 6. DM TY 2 with neuropathy a. Resume gabapentin 100 at bedtime, stop combination insulin liraglutide and just place on sliding scale for now 7. Depression a. Continue Remeron 7.5 at bedtime, Cymbalta 30 daily 8. Normocytic anemia a. Monitor trends-transfusion threshold below 7  DVT prophylaxis: Lovenox Code Status: Full Family Communication: None currently present Disposition:  Status is: Inpatient  Remains inpatient appropriate because:Hemodynamically unstable, Persistent severe electrolyte disturbances, Ongoing active pain requiring inpatient pain management and IV treatments appropriate due to intensity of illness or inability to take PO   Dispo: The patient is from: SNF              Anticipated d/c is to: SNF              Anticipated d/c date is: 3 days              Patient currently is not medically stable to d/c.   Difficult to place patient No       Consultants:   Podiatry  General surgery  Procedures: None yet  Antimicrobials: Invanz   Subjective: Arousable and awake can oriented but seems a little sleepy Understands she may need surgery of right foot No pain fever chills nausea vomiting currently No chest pain Food remains relatively untouched at the bedside  Objective: Vitals:   04/04/20 0500 04/04/20 0530 04/04/20 0600 04/04/20 0630  BP: (!) 100/46 (!) 113/49 (!) 110/50 (!) 99/51  Pulse: 64 63 65 64  Resp: '16 16 17 16  ' Temp:      TempSrc:      SpO2: 100% 100% 100%  99%    Intake/Output Summary (Last 24 hours) at 04/04/2020 0700 Last data filed at 04/03/2020 1916 Gross per 24 hour  Intake 999 ml  Output --  Net 999 ml   There were no vitals filed for this visit.  Examination:  Awake coherent younger than stated age EOMI NCAT arcus  senilis I9-C7 6 systolic murmur-on monitors paced rhythm with PVC Abdomen soft no rebound Right foot heel and toe examined did not examine sacral decubitus today Neurologically intact  Data Reviewed: I have personally reviewed following labs and imaging studies No labs today  Baseline BUN/creatinine about 2.1-9 > 90/2.2 Sodium 132 Alk phos 142 White count On admission 19.8  COVID-19 Labs  No results for input(s): DDIMER, FERRITIN, LDH, CRP in the last 72 hours.  Lab Results  Component Value Date   [place] NEGATIVE 04/03/2020   [place] NEGATIVE 02/08/2020   [place] NEGATIVE 01/04/2020   [place] NEGATIVE 07/17/2019     Radiology Studies: DG Sacrum/Coccyx  Result Date: 04/03/2020 CLINICAL DATA:  Sacral wound EXAM: SACRUM AND COCCYX - 2+ VIEW COMPARISON:  October 06, 2005 FINDINGS: Frontal, angled frontal, and lateral views were obtained. There is no fracture or diastasis. No erosive change or bony destruction evident. There is osteoarthritic change in each sacroiliac joint. No soft tissue air evident. IMPRESSION: No bony destruction. No fracture or diastasis. Osteoarthritic change in each sacroiliac joint noted. Electronically Signed   By: Lowella Grip III M.D.   On: 04/03/2020 14:36   CT FOOT RIGHT WO CONTRAST  Result Date: 04/03/2020 CLINICAL DATA:  Concern for osteomyelitis, abnormal radiographs EXAM: CT OF THE RIGHT FOOT WITHOUT CONTRAST TECHNIQUE: Multidetector CT imaging of the right foot was performed according to the standard protocol. Multiplanar CT image reconstructions were also generated. COMPARISON:  Radiographs 02/08/2020, 04/03/2020 FINDINGS: Bones/Joint/Cartilage The osseous structures appear diffusely demineralized. There conspicuous transcortical lucencies involving the first distal phalanx as well as the head and base of the first proximal phalanx. Some more chronic resorptive changes of distal phalanx could reflect sequela prior infection as well.  Soft tissue ulceration is noted at the level of the posterior calcaneus some slight cortical irregularity and possible early periostitis features which could reflect sequela of acute or chronic osteomyelitis. Hammertoe deformities are noted at the second through fourth rays including inferior subluxation across the second PIP with likely fracture cross the proximal phalangeal head. Sclerotic and irregular changes at the hallucal sesamoidal complexes could reflect sequela of prior sesamoiditis. Note is made of a significant hallux valgus deformity of the first ray as well. Additional degenerative changes noted throughout foot. No acute acute fracture or traumatic malalignment. Additional degenerative changes noted in the ankle. Ligaments Suboptimally assessed by CT. Muscles and Tendons Diffuse muscular atrophy of the foot. No clearly retracted or torn tendons are seen. Extensive soft tissue stranding is noted along the dorsal and plantar tendon sheaths particularly of the forefoot. Soft tissues Extensive soft tissue swelling of the foot and lower extremity edema. Focal ulceration at the tip of the first digit and along the posterior calcaneus, as described above. No sizable ankle joint effusion. No other organized fluid collection is evident. Extensive vascular calcium. IMPRESSION: 1. Extensive soft tissue swelling thickening of the foot most pronounced about the first ray and posterior to the calcaneus with there is focal soft tissue. Subjacent areas of cortical irregularity are seen involving the first proximal and distal phalanges concerning for osteomyelitis. Some additional cortical irregularity and periostitis of the calcaneus could reflect an additional site  of acute or chronic osteomyelitis. MR imaging, ideally with contrast if tolerable, is a more sensitive and specific modality further determination of osteomyelitis. 2. Additional chronic resorptive changes of the distal phalanx could reflect sequela of  more remote small during chronic osteomyelitis as well. 3. Likely posttraumatic deformity of the head of the second proximal phalanx with volar subluxation across the second PIP. 4. Hammertoe deformities at the second through fourth rays. 5. Sclerotic and irregular changes at the hallucal sesamoidal complexes could reflect sequela of prior sesamoiditis. 6. Hallux valgus deformity and bunion formation of foot. 7. Extensive soft tissue swelling of the foot and lower extremity edema. Electronically Signed   By: Lovena Le M.D.   On: 04/03/2020 22:49   DG Foot Complete Right  Result Date: 04/03/2020 CLINICAL DATA:  Pain EXAM: RIGHT FOOT COMPLETE - 3+ VIEW COMPARISON:  February 08, 2020 FINDINGS: Frontal, oblique, and lateral views were obtained. Portions of the mid to distal aspects of the first distal phalanx are absent, likely due to chronic bony destruction. There is a focal area of apparent bony destruction along the lateral aspect of the distal portion of the remaining first distal phalanx, concerning for active osteomyelitis in this area. No other bony destruction seen. Prior fractures of the proximal aspect of the second proximal phalanx and distal aspect of the third proximal phalanx appear stable. No new fracture evident. No dislocation. Bones are osteoporotic. Joint spaces appear unremarkable. There is widespread arterial vascular calcification. IMPRESSION: Apparent osteomyelitis involving a portion of the first distal phalanx. Note that much of this bone has undergone previous resorption, likely due to chronic osteomyelitis change. Diffuse osteoporosis. Prior fractures of the proximal aspect of the second proximal phalanx and distal aspect of the third proximal phalanx. No acute fracture or dislocation. Widespread arterial vascular calcification noted. Electronically Signed   By: Lowella Grip III M.D.   On: 04/03/2020 14:40     Scheduled Meds: . collagenase   Topical Daily   Continuous  Infusions:   LOS: 1 day    Time spent: [place], MD [facility] To contact the attending provider between 7A-7P or the covering provider during after hours 7P-7A, please log into the web site [email or web address] and access using universal [place] password for that web site. If you do not have the password, please call the hospital operator.  04/04/2020, 7:00 AM

## 2020-04-04 NOTE — ED Notes (Signed)
Report called and given to nurse.

## 2020-04-04 NOTE — Telephone Encounter (Signed)
I saw the patient last night.

## 2020-04-04 NOTE — ED Notes (Signed)
Updated patients daughter on plan of care  [phone]

## 2020-04-04 NOTE — ED Notes (Signed)
PT at bedside.

## 2020-04-05 ENCOUNTER — Other Ambulatory Visit: Payer: Self-pay

## 2020-04-05 ENCOUNTER — Telehealth: Payer: Self-pay | Admitting: Sports Medicine

## 2020-04-05 DIAGNOSIS — L89159 Pressure ulcer of sacral region, unspecified stage: Secondary | ICD-10-CM | POA: Diagnosis not present

## 2020-04-05 LAB — COMPREHENSIVE METABOLIC PANEL
ALT: 18 U/L (ref 0–44)
AST: 21 U/L (ref 15–41)
Albumin: 1.6 g/dL — ABNORMAL LOW (ref 3.5–5.0)
Alkaline Phosphatase: 135 U/L — ABNORMAL HIGH (ref 38–126)
Anion gap: 14 (ref 5–15)
BUN: 82 mg/dL — ABNORMAL HIGH (ref 8–23)
CO2: 16 mmol/L — ABNORMAL LOW (ref 22–32)
Calcium: 8 mg/dL — ABNORMAL LOW (ref 8.9–10.3)
Chloride: 100 mmol/L (ref 98–111)
Creatinine, Ser: 2.23 mg/dL — ABNORMAL HIGH (ref 0.44–1.00)
GFR, Estimated: 22 mL/min — ABNORMAL LOW (ref >60)
Glucose, Bld: 103 mg/dL — ABNORMAL HIGH (ref 70–99)
Potassium: 4.4 mmol/L (ref 3.5–5.1)
Sodium: 130 mmol/L — ABNORMAL LOW (ref 135–145)
Total Bilirubin: 0.6 mg/dL (ref 0.3–1.2)
Total Protein: 5.8 g/dL — ABNORMAL LOW (ref 6.5–8.1)

## 2020-04-05 LAB — CBC WITH DIFFERENTIAL/PLATELET
Abs Immature Granulocytes: 0.25 10*3/uL — ABNORMAL HIGH (ref 0.00–0.07)
Basophils Absolute: 0.1 10*3/uL (ref 0.0–0.1)
Basophils Relative: 0 %
Eosinophils Absolute: 0.3 10*3/uL (ref 0.0–0.5)
Eosinophils Relative: 2 %
HCT: 30.9 % — ABNORMAL LOW (ref 36.0–46.0)
Hemoglobin: 10.1 g/dL — ABNORMAL LOW (ref 12.0–15.0)
Immature Granulocytes: 1 %
Lymphocytes Relative: 11 %
Lymphs Abs: 2.4 10*3/uL (ref 0.7–4.0)
MCH: 30.5 pg (ref 26.0–34.0)
MCHC: 32.7 g/dL (ref 30.0–36.0)
MCV: 93.4 fL (ref 80.0–100.0)
Monocytes Absolute: 2 10*3/uL — ABNORMAL HIGH (ref 0.1–1.0)
Monocytes Relative: 9 %
Neutro Abs: 17.1 10*3/uL — ABNORMAL HIGH (ref 1.7–7.7)
Neutrophils Relative %: 77 %
Platelets: 246 10*3/uL (ref 150–400)
RBC: 3.31 MIL/uL — ABNORMAL LOW (ref 3.87–5.11)
RDW: 16.6 % — ABNORMAL HIGH (ref 11.5–15.5)
WBC: 22.2 10*3/uL — ABNORMAL HIGH (ref 4.0–10.5)
nRBC: 0 % (ref 0.0–0.2)

## 2020-04-05 LAB — GLUCOSE, CAPILLARY
Glucose-Capillary: 92 mg/dL (ref 70–99)
Glucose-Capillary: 86 mg/dL (ref 70–99)
Glucose-Capillary: 101 mg/dL — ABNORMAL HIGH (ref 70–99)
Glucose-Capillary: 95 mg/dL (ref 70–99)

## 2020-04-05 MED ORDER — GLUCERNA SHAKE PO LIQD
237.0000 mL | ORAL | Status: DC
  Administered 2020-04-07 – 2020-04-09 (×3): 237 mL via ORAL
  Filled 2020-04-05 (×6): qty 237

## 2020-04-05 MED ORDER — PROSOURCE PLUS PO LIQD
30.0000 mL | Freq: Two times a day (BID) | ORAL | Status: DC
  Administered 2020-04-07 – 2020-04-11 (×4): 30 mL via ORAL
  Filled 2020-04-05 (×4): qty 30

## 2020-04-05 MED ORDER — JUVEN PO PACK
1.0000 | PACK | Freq: Two times a day (BID) | ORAL | Status: DC
  Administered 2020-04-05 – 2020-04-11 (×9): 1 via ORAL
  Filled 2020-04-05 (×13): qty 1

## 2020-04-05 NOTE — Progress Notes (Signed)
04/05/20 1100  Hydrotherapy Evaluation.  Subjective Assessment  Subjective Patient  Somnolent, answers simple questions.  Patient and Family Stated Goals agreed to wound care,  Date of Onset  (present on adm.)  Prior Treatments unsure.  Pressure Injury 04/04/20 Sacrum Medial Unstageable - Full thickness tissue loss in which the base of the injury is covered by slough (yellow, tan, gray, green or brown) and/or eschar (tan, brown or black) in the wound bed. Physical therapy only-  sacral   Date First Assessed/Time First Assessed: 04/04/20 1030   Location: Sacrum  Location Orientation: Medial  Staging: Unstageable - Full thickness tissue loss in which the base of the injury is covered by slough (yellow, tan, gray, green or brown) and/or ...  Dressing Type ABD;Gauze (Comment);Moist to moist;Santyl;Silicone dressing  Dressing Changed  Dressing Change Frequency BID  Site / Wound Assessment Granulation tissue;Friable  % Wound base Red or Granulating 0%  % Wound base Black/Eschar Dark slough  Wound Length (cm) 6 cm  Wound Width (cm) 7 cm  Wound Depth (cm) 2 cm (after debridement by surgery PA  Wound Surface Area (cm^2) 42 cm^2  Wound Volume (cm^3) 42 cm^3  Margins Unattached edges (unapproximated)  Drainage Amount Moderate  Drainage Description Odor;Purulent  Treatment ;Hydrotherapy (Pulse lavage);Packing (Saline gauze)  Hydrotherapy  Pulsed Lavage with Suction (psi) 12 psi  Pulsed Lavage with Suction - Normal Saline Used 1000 mL  Pulsed Lavage Tip Tip with splash shield  Pulsed lavage therapy - wound location sacral  Selective Debridement  Selective Debridement - Location Sacral by surgery PA,         Wound Therapy - Assess/Plan/Recommendations  Wound Therapy - Clinical Statement CCS PA, present to provide sharp debridement of malodorous slough form wound, increasing size and depth of wound. Patient tolerated well. Wound  Continued to bleed following sharp debridement, PLS was limited  to right side of wound due to continued oozing of blood.. Pressure applied for 10 minutes with noted minimal bleeding. RN alerted that wound  Had been bleeding. Dressing placed with santyl saline guaze and ABD. Continue Hydrotherapy, debridement.  Wound Therapy - Functional Problem List bed bound, total care  Factors Delaying/Impairing Wound Healing Diabetes Mellitus;Incontinence  Hydrotherapy Plan Debridement;Dressing change;Patient/family education;Pulsatile lavage with suction  Wound Therapy - Frequency 6X / week  Wound Therapy - Current Recommendations Case manager/social work  Wound Therapy - Follow Up Recommendations Skilled nursing facility  Wound Plan PLS , debridement, dressing changes  Wound Therapy Goals - Improve the function of patient's integumentary system by progressing the wound(s) through the phases of wound healing by:  Decrease Necrotic Tissue to 50  Decrease Necrotic Tissue - Progress Goal set today  Increase Granulation Tissue to 50  Increase Granulation Tissue - Progress Goal set today  Patient/Family will be able to  direct pressure relief from sacrum  Patient/Family Instruction Goal - Progress Goal set today  Goals/treatment plan/discharge plan were made with and agreed upon by patient/family Yes  Time For Goal Achievement 2 weeks  Wound Therapy - Potential for Goals Fair  [place] Pager [phone] Office [phone]

## 2020-04-05 NOTE — Progress Notes (Signed)
[facility] Progress Note     Subjective: CC-  Seen during hydrotherapy.  Objective: Vital signs in last 24 hours: Temp:  [97.6 F (36.4 C)-98 F (36.7 C)] 97.6 F (36.4 C) (02/10 0826) Pulse Rate:  [59-121] 59 (02/10 0826) Resp:  [13-25] 16 (02/10 0826) BP: (86-141)/(38-86) 92/38 (02/10 0826) SpO2:  [96 %-100 %] 96 % (02/10 0826) Weight:  [62.1 kg] 62.1 kg (02/10 0018) Last BM Date: 04/04/20  Intake/Output from previous day: 02/09 0701 - 02/10 0700 In: 719 [P.O.:100; I.V.:569; IV Piggyback:50] Out: -  Intake/Output this shift: No intake/output data recorded.  PE: Gen:  Alert, NAD, pleasant Pulm:  rate and effort normal Skin: warm and dry GU:  -before debridement:   -after debridement: total wound measures 7x6cm (opening is 5x5cm) with 2cm depth, 1cm circumferential undermining with 2cm undermining at 4 o'clock position. Bone is palpable but not fully exposed     Lab Results:  Recent Labs    04/04/20 0945 04/05/20 0542  WBC 22.6* 22.2*  HGB 9.3* 10.1*  HCT 27.9* 30.9*  PLT 286 246   BMET Recent Labs    04/04/20 0945 04/05/20 0542  NA 133* 130*  K 4.7 4.4  CL 100 100  CO2 20* 16*  GLUCOSE 142* 103*  BUN 94* 82*  CREATININE 2.29* 2.23*  CALCIUM 8.2* 8.0*   PT/INR No results for input(s): LABPROT, INR in the last 72 hours. CMP     Component Value Date/Time   NA 130 (L) 04/05/2020 0542   NA 136 (A) 02/15/2020 0000   K 4.4 04/05/2020 0542   CL 100 04/05/2020 0542   CO2 16 (L) 04/05/2020 0542   GLUCOSE 103 (H) 04/05/2020 0542   BUN 82 (H) 04/05/2020 0542   BUN 101 (A) 02/15/2020 0000   CREATININE 2.23 (H) 04/05/2020 0542   CREATININE 0.92 06/07/2014 1151   CALCIUM 8.0 (L) 04/05/2020 0542   PROT 5.8 (L) 04/05/2020 0542   PROT 7.8 09/01/2018 1142   ALBUMIN 1.6 (L) 04/05/2020 0542   ALBUMIN 4.2 09/01/2018 1142   AST 21 04/05/2020 0542   ALT 18 04/05/2020 0542   ALKPHOS 135 (H) 04/05/2020 0542   BILITOT 0.6 04/05/2020 0542    BILITOT 0.3 09/01/2018 1142   GFRNONAA 22 (L) 04/05/2020 0542   GFRNONAA 63 06/07/2014 1151   GFRAA 35.08 02/15/2020 0000   GFRAA 73 06/07/2014 1151   Lipase     Component Value Date/Time   LIPASE 17 02/07/2019 1323       Studies/Results: DG Sacrum/Coccyx  Result Date: 04/03/2020 CLINICAL DATA:  Sacral wound EXAM: SACRUM AND COCCYX - 2+ VIEW COMPARISON:  October 06, 2005 FINDINGS: Frontal, angled frontal, and lateral views were obtained. There is no fracture or diastasis. No erosive change or bony destruction evident. There is osteoarthritic change in each sacroiliac joint. No soft tissue air evident. IMPRESSION: No bony destruction. No fracture or diastasis. Osteoarthritic change in each sacroiliac joint noted. Electronically Signed   By: Lowella Grip III M.D.   On: 04/03/2020 14:36   CT FOOT RIGHT WO CONTRAST  Result Date: 04/03/2020 CLINICAL DATA:  Concern for osteomyelitis, abnormal radiographs EXAM: CT OF THE RIGHT FOOT WITHOUT CONTRAST TECHNIQUE: Multidetector CT imaging of the right foot was performed according to the standard protocol. Multiplanar CT image reconstructions were also generated. COMPARISON:  Radiographs 02/08/2020, 04/03/2020 FINDINGS: Bones/Joint/Cartilage The osseous structures appear diffusely demineralized. There conspicuous transcortical lucencies involving the first distal phalanx as well as the head and base of  the first proximal phalanx. Some more chronic resorptive changes of distal phalanx could reflect sequela prior infection as well. Soft tissue ulceration is noted at the level of the posterior calcaneus some slight cortical irregularity and possible early periostitis features which could reflect sequela of acute or chronic osteomyelitis. Hammertoe deformities are noted at the second through fourth rays including inferior subluxation across the second PIP with likely fracture cross the proximal phalangeal head. Sclerotic and irregular changes at the hallucal  sesamoidal complexes could reflect sequela of prior sesamoiditis. Note is made of a significant hallux valgus deformity of the first ray as well. Additional degenerative changes noted throughout foot. No acute acute fracture or traumatic malalignment. Additional degenerative changes noted in the ankle. Ligaments Suboptimally assessed by CT. Muscles and Tendons Diffuse muscular atrophy of the foot. No clearly retracted or torn tendons are seen. Extensive soft tissue stranding is noted along the dorsal and plantar tendon sheaths particularly of the forefoot. Soft tissues Extensive soft tissue swelling of the foot and lower extremity edema. Focal ulceration at the tip of the first digit and along the posterior calcaneus, as described above. No sizable ankle joint effusion. No other organized fluid collection is evident. Extensive vascular calcium. IMPRESSION: 1. Extensive soft tissue swelling thickening of the foot most pronounced about the first ray and posterior to the calcaneus with there is focal soft tissue. Subjacent areas of cortical irregularity are seen involving the first proximal and distal phalanges concerning for osteomyelitis. Some additional cortical irregularity and periostitis of the calcaneus could reflect an additional site of acute or chronic osteomyelitis. MR imaging, ideally with contrast if tolerable, is a more sensitive and specific modality further determination of osteomyelitis. 2. Additional chronic resorptive changes of the distal phalanx could reflect sequela of more remote small during chronic osteomyelitis as well. 3. Likely posttraumatic deformity of the head of the second proximal phalanx with volar subluxation across the second PIP. 4. Hammertoe deformities at the second through fourth rays. 5. Sclerotic and irregular changes at the hallucal sesamoidal complexes could reflect sequela of prior sesamoiditis. 6. Hallux valgus deformity and bunion formation of foot. 7. Extensive soft  tissue swelling of the foot and lower extremity edema. Electronically Signed   By: Lovena Le M.D.   On: 04/03/2020 22:49   DG Foot Complete Right  Result Date: 04/03/2020 CLINICAL DATA:  Pain EXAM: RIGHT FOOT COMPLETE - 3+ VIEW COMPARISON:  February 08, 2020 FINDINGS: Frontal, oblique, and lateral views were obtained. Portions of the mid to distal aspects of the first distal phalanx are absent, likely due to chronic bony destruction. There is a focal area of apparent bony destruction along the lateral aspect of the distal portion of the remaining first distal phalanx, concerning for active osteomyelitis in this area. No other bony destruction seen. Prior fractures of the proximal aspect of the second proximal phalanx and distal aspect of the third proximal phalanx appear stable. No new fracture evident. No dislocation. Bones are osteoporotic. Joint spaces appear unremarkable. There is widespread arterial vascular calcification. IMPRESSION: Apparent osteomyelitis involving a portion of the first distal phalanx. Note that much of this bone has undergone previous resorption, likely due to chronic osteomyelitis change. Diffuse osteoporosis. Prior fractures of the proximal aspect of the second proximal phalanx and distal aspect of the third proximal phalanx. No acute fracture or dislocation. Widespread arterial vascular calcification noted. Electronically Signed   By: Lowella Grip III M.D.   On: 04/03/2020 14:40    Anti-infectives: Anti-infectives (From admission,  onward)   Start     Dose/Rate Route Frequency Ordered Stop   04/04/20 1200  ertapenem (INVANZ) 500 mg in sodium chloride 0.9 % 50 mL IVPB        500 mg 100 mL/hr over 30 Minutes Intravenous Every 24 hours 04/04/20 0829     04/04/20 0845  ceFEPIme (MAXIPIME) 2 g in sodium chloride 0.9 % 100 mL IVPB  Status:  Discontinued        2 g 200 mL/hr over 30 Minutes Intravenous Every 24 hours 04/04/20 0836 04/04/20 0837   04/04/20 0845   metroNIDAZOLE (FLAGYL) tablet 500 mg  Status:  Discontinued        500 mg Oral Every 8 hours 04/04/20 0836 04/04/20 0837   04/03/20 1415  piperacillin-tazobactam (ZOSYN) IVPB 3.375 g        3.375 g 100 mL/hr over 30 Minutes Intravenous  Once 04/03/20 1401 04/03/20 1500       Assessment/Plan DM AKI on CKD-3B Aortic stenosis s/p TAVR on plavix - on ASA $Remo'81mg'YKowZ$ , plavix on hold HOCM S/p pacemaker PVD CAD s/p CABG 2006 Hx CVA Hx breast cancer Right heel osteomyelitis - per podiatry, on invanz Wheelchair bound  Jehovah's witness  Sacral decubitus ulcer - Wound debrided today at bedside (see procedure note) - Continue hydrotherapy and BID wet to dry dressing changes with santyl - Air mattress ordered - We will see again Monday, please call sooner with concerns  ID - invanz for right heel osteomyelitis  FEN - CM diet VTE - sq heparin Foley - none Follow up - TBD   LOS: 2 days    Wellington Hampshire, [facility] 04/05/2020, 9:59 AM Please see Amion for pager number during day hours 7:00am-4:30pm

## 2020-04-05 NOTE — Progress Notes (Signed)
Initial Nutrition Assessment  RD working remotely.  DOCUMENTATION CODES:   Not applicable  INTERVENTION:  - will order Glucerna Shake once/day, each supplement provides 220 kcal and 10 grams of protein. - will order 30 ml Prosource Plus BID, each supplement provides 100 kcal and 15 grams protein.  - will order Juven BID, each packet provides 95 calories, 2.5 grams of protein (collagen), and 9.8 grams of carbohydrate (3 grams sugar); also contains 7 grams of L-arginine and L-glutamine, 300 mg vitamin C, 15 mg vitamin E, 1.2 mcg vitamin B-12, 9.5 mg zinc, 200 mg calcium, and 1.5 g  Calcium Beta-hydroxy-Beta-methylbutyrate to support wound healing.   NUTRITION DIAGNOSIS:   Increased nutrient needs related to acute illness,wound healing as evidenced by estimated needs.  GOAL:   Patient will meet greater than or equal to 90% of their needs  MONITOR:   PO intake,Supplement acceptance,Labs,Weight trends,Skin  REASON FOR ASSESSMENT:   Malnutrition Screening Tool  ASSESSMENT:   75 y.o. female with medical history of CAD s/p CABG, AS s/p TAVR, type 2 DM, stage 4 CKD, CHF, HLD, HTN, and breast cancer s/p mastectomy. She presented from [facility] due to concern of sacral and heel wounds infections. She reported pain to heels.  Unable to talk with patient x2 attempts on site earlier today. No intakes documented since admission.  Weight today is 137 lb and weight on 02/28/20 was 146 lb. This indicates 9 lb weight loss (6.2% body weight) in the past 5 weeks; significant for time frame.   Per notes: - s/p I&D of sacral wound this AM - CT heels indicated osteomyelitis--possible OR visit 2/11 - Podiatry and Surgery following   Labs reviewed; CBGs: 92, 86, 101 mg/dl, Na: 130 mmol/l, BUN: 82 mg/dl, creatinine: 2.23 mg/dl, Ca: 8 mg/dl, GFR: 22 ml/min. Medications reviewed; sliding scale novolog, 20 g lactulose/day, 6 mg melatonin/day, 1 tablet multivitamin with minerals/day, 250 mg  florastor BID.  IVF; NS @ 75 ml/hr.    NUTRITION - FOCUSED PHYSICAL EXAM:  unable to complete at this time.   Diet Order:   Diet Order            Diet NPO time specified  Diet effective 0500 tomorrow           Diet Carb Modified Fluid consistency: Thin; Room service appropriate? Yes  Diet effective now                 EDUCATION NEEDS:   Not appropriate for education at this time  Skin:  Skin Assessment: Skin Integrity Issues: Skin Integrity Issues:: Unstageable,Other (Comment) Unstageable: full thickness to sacrum Other: non-healing ulcers to bilateral heels  Last BM:  2/9  Height:   Ht Readings from Last 1 Encounters:  04/05/20 $RemoveB'5\' 6"'YJzbYVYp$  (1.676 m)    Weight:   Wt Readings from Last 1 Encounters:  04/05/20 62.1 kg    Estimated Nutritional Needs:  Kcal:  1875-2100 kcal Protein:  95-110 grams Fluid:  >/= 2.1 L/day      Jarome Matin, MS, RD, LDN, CNSC Inpatient Clinical Dietitian RD pager # available in AMION  After hours/weekend pager # available in [facility]

## 2020-04-05 NOTE — Telephone Encounter (Signed)
Patients daughter has requested return call regarding information for moms surgery, she called in earlier and I transferred call to [facility]. Patients daughter stated she didn't receive any information regarding patient surgery from [facility] and asked to speak directly to you, Please Advise

## 2020-04-05 NOTE — Progress Notes (Addendum)
PROGRESS NOTE   [name]  GEX:[id number] DOB: 09/27/1945 DOA: 04/03/2020 PCP: Hendricks Limes, MD  Brief Narrative:  75 year old female skilled living facility resident-wheelchair-bound at baseline critical right lower extremity ischemia status post angiography recanalization A NT tibial artery-balloon angioplasty on aspirin Plavix Aortic stenosis status post TAVR 03/01/2019 for, aortic valve repair 2006 HFpEF + CHB + PPM (placed 01/2019)-bacteremia group B strep 06/2019 status post PPM explantation and then subsequent [employer] Micra AV Prior coronavirus infection 06/2018 DM TY 2 Jehovah's Witness CKD stage III Prior CVA Breast cancer status postmastectomy and chemotherapy  Presented from [facility] has been treated in the outpatient setting for osteomyelitis of the right great toe with doxycycline--on follow-up evaluation found to have acute kidney injury and was started on saline in the outpatient setting-found to have sacral decubitus and right heel ulcer Culture showed right heel ulcer was positive for Morganella and had been changed to cefdinir  Found to have mild hypotension white count elevated to 19.8 General surgery saw the patient for sacral ulcer Podiatry saw the patient in addition-patient was placed on Invanz CT scan was ordered  Assessment & Plan:   Active Problems:   Open wound of heel   1. Osteomyelitis of right heel-failed outpatient therapy on multiple agents a. CT scan 2/8 reveals first proximal distal phalangeal osteomyelitis?  Calcaneal osteomyelitis-see their notes regarding risk-benefit and plans for surgery-continue Invanz till can obtain deep bone culture and may need 6 weeks antibiotics regardless b. Received bolus of IV fluids-changed to IV fluid '@75'  cc/H continue 2. Sacral decubitus ulcer a. General surgery recommends hydrotherapy as per PT-nonoperative candidate per them at this time b. Santyl as needed c. Wounds reviewed 2/10 see below  pictures 3. HOCM + bradycardia a. Metoprolol as below-holding hydralazine for now 4. AoS status post TAVR a. Plavix held-aspirin continued b. Continue metoprolol 12.5 twice daily 5. AKI on admission superimposed on CKD 3B 6. Metabolic a. Saline 75 cc/H-avoid nephrotoxins b. Kidney function is slowly improving she has a mild metabolic acidosis in addition c. We will bladder scan to ensure there is no retention 7. DM TY 2 with neuropathy a. Resume gabapentin 100 at bedtime, stop combination insulin liraglutide and just place on sliding scale for now 8. Depression a. Continue Remeron 7.5 at bedtime, Cymbalta 30 daily 9. Normocytic anemia a. Monitor trends-transfusion threshold below 7  DVT prophylaxis: Lovenox Code Status: Full Family Communication: called [name] Glennon Mac [phone] no answer--will re-attempt when able Disposition:  Status is: Inpatient  Remains inpatient appropriate because:Hemodynamically unstable, Persistent severe electrolyte disturbances, Ongoing active pain requiring inpatient pain management and IV treatments appropriate due to intensity of illness or inability to take PO   Dispo: The patient is from: SNF              Anticipated d/c is to: SNF              Anticipated d/c date is: 3 days              Patient currently is not medically stable to d/c.   Difficult to place patient No       Consultants:   Podiatry  General surgery  Procedures: None yet  Antimicrobials: Invanz   Subjective:  Sitting quietly in bed no distress Seems to understand that she has a sacral decubitus but did not understand much apparently about the lower extremity No pain  Objective: Vitals:   04/05/20 0014 04/05/20 0018 04/05/20 0500 04/05/20 0826  BP: 122/86  120/80 (!) 92/38  Pulse: (!) 59  61 (!) 59  Resp: '18  18 16  ' Temp: 98 F (36.7 C)  98 F (36.7 C) 97.6 F (36.4 C)  TempSrc: Oral  Oral Oral  SpO2: 100%   96%  Weight:  62.1 kg    Height:  '5\' 6"'  (1.676  m)      Intake/Output Summary (Last 24 hours) at 04/05/2020 0901 Last data filed at 04/05/2020 0352 Gross per 24 hour  Intake 718.98 ml  Output --  Net 718.98 ml   Filed Weights   04/05/20 0018  Weight: 62.1 kg    Examination:  EOMI NCAT no focal deficit Chest clear no added sound Abdomen soft slight distention no rebound    As compared to 2/8 picture pictures taken 2/10 No lower extremity edema Foot exam deferred   Data Reviewed: I have personally reviewed following labs and imaging studies No labs today  Baseline BUN/creatinine about 2.1-9 > 90/2.2-->82/2.2 CO2 was 60 Sodium 130 Alk phos 135 White count 22.6-->2.2 One  COVID-19 Labs  No results for input(s): DDIMER, FERRITIN, LDH, CRP in the last 72 hours.  Lab Results  Component Value Date   [place] NEGATIVE 04/03/2020   [place] NEGATIVE 02/08/2020   [place] NEGATIVE 01/04/2020   [place] NEGATIVE 07/17/2019     Radiology Studies: DG Sacrum/Coccyx  Result Date: 04/03/2020 CLINICAL DATA:  Sacral wound EXAM: SACRUM AND COCCYX - 2+ VIEW COMPARISON:  October 06, 2005 FINDINGS: Frontal, angled frontal, and lateral views were obtained. There is no fracture or diastasis. No erosive change or bony destruction evident. There is osteoarthritic change in each sacroiliac joint. No soft tissue air evident. IMPRESSION: No bony destruction. No fracture or diastasis. Osteoarthritic change in each sacroiliac joint noted. Electronically Signed   By: Lowella Grip III M.D.   On: 04/03/2020 14:36   CT FOOT RIGHT WO CONTRAST  Result Date: 04/03/2020 CLINICAL DATA:  Concern for osteomyelitis, abnormal radiographs EXAM: CT OF THE RIGHT FOOT WITHOUT CONTRAST TECHNIQUE: Multidetector CT imaging of the right foot was performed according to the standard protocol. Multiplanar CT image reconstructions were also generated. COMPARISON:  Radiographs 02/08/2020, 04/03/2020 FINDINGS: Bones/Joint/Cartilage The osseous  structures appear diffusely demineralized. There conspicuous transcortical lucencies involving the first distal phalanx as well as the head and base of the first proximal phalanx. Some more chronic resorptive changes of distal phalanx could reflect sequela prior infection as well. Soft tissue ulceration is noted at the level of the posterior calcaneus some slight cortical irregularity and possible early periostitis features which could reflect sequela of acute or chronic osteomyelitis. Hammertoe deformities are noted at the second through fourth rays including inferior subluxation across the second PIP with likely fracture cross the proximal phalangeal head. Sclerotic and irregular changes at the hallucal sesamoidal complexes could reflect sequela of prior sesamoiditis. Note is made of a significant hallux valgus deformity of the first ray as well. Additional degenerative changes noted throughout foot. No acute acute fracture or traumatic malalignment. Additional degenerative changes noted in the ankle. Ligaments Suboptimally assessed by CT. Muscles and Tendons Diffuse muscular atrophy of the foot. No clearly retracted or torn tendons are seen. Extensive soft tissue stranding is noted along the dorsal and plantar tendon sheaths particularly of the forefoot. Soft tissues Extensive soft tissue swelling of the foot and lower extremity edema. Focal ulceration at the tip of the first digit and along the posterior calcaneus, as described above. No sizable ankle joint effusion. No other organized fluid collection  is evident. Extensive vascular calcium. IMPRESSION: 1. Extensive soft tissue swelling thickening of the foot most pronounced about the first ray and posterior to the calcaneus with there is focal soft tissue. Subjacent areas of cortical irregularity are seen involving the first proximal and distal phalanges concerning for osteomyelitis. Some additional cortical irregularity and periostitis of the calcaneus could  reflect an additional site of acute or chronic osteomyelitis. MR imaging, ideally with contrast if tolerable, is a more sensitive and specific modality further determination of osteomyelitis. 2. Additional chronic resorptive changes of the distal phalanx could reflect sequela of more remote small during chronic osteomyelitis as well. 3. Likely posttraumatic deformity of the head of the second proximal phalanx with volar subluxation across the second PIP. 4. Hammertoe deformities at the second through fourth rays. 5. Sclerotic and irregular changes at the hallucal sesamoidal complexes could reflect sequela of prior sesamoiditis. 6. Hallux valgus deformity and bunion formation of foot. 7. Extensive soft tissue swelling of the foot and lower extremity edema. Electronically Signed   By: Lovena Le M.D.   On: 04/03/2020 22:49   DG Foot Complete Right  Result Date: 04/03/2020 CLINICAL DATA:  Pain EXAM: RIGHT FOOT COMPLETE - 3+ VIEW COMPARISON:  February 08, 2020 FINDINGS: Frontal, oblique, and lateral views were obtained. Portions of the mid to distal aspects of the first distal phalanx are absent, likely due to chronic bony destruction. There is a focal area of apparent bony destruction along the lateral aspect of the distal portion of the remaining first distal phalanx, concerning for active osteomyelitis in this area. No other bony destruction seen. Prior fractures of the proximal aspect of the second proximal phalanx and distal aspect of the third proximal phalanx appear stable. No new fracture evident. No dislocation. Bones are osteoporotic. Joint spaces appear unremarkable. There is widespread arterial vascular calcification. IMPRESSION: Apparent osteomyelitis involving a portion of the first distal phalanx. Note that much of this bone has undergone previous resorption, likely due to chronic osteomyelitis change. Diffuse osteoporosis. Prior fractures of the proximal aspect of the second proximal phalanx and  distal aspect of the third proximal phalanx. No acute fracture or dislocation. Widespread arterial vascular calcification noted. Electronically Signed   By: Lowella Grip III M.D.   On: 04/03/2020 14:40     Scheduled Meds: . aspirin EC  81 mg Oral Daily  . atorvastatin  80 mg Oral QHS  . collagenase   Topical Daily  . DULoxetine  30 mg Oral Daily  . gabapentin  100 mg Oral QHS  . heparin  5,000 Units Subcutaneous Q8H  . insulin aspart  0-15 Units Subcutaneous TID WC  . lactulose  20 g Oral Daily  . melatonin  6 mg Oral QPM  . metoprolol tartrate  12.5 mg Oral BID  . mirtazapine  7.5 mg Oral QHS  . multivitamin with minerals  1 tablet Oral Daily  . saccharomyces boulardii  250 mg Oral BID   Continuous Infusions: . sodium chloride 75 mL/hr at 04/05/20 0352  . ertapenem Stopped (04/04/20 1324)     LOS: 2 days    Time spent: Riddle, MD [facility] To contact the attending provider between 7A-7P or the covering provider during after hours 7P-7A, please log into the web site [email or web address] and access using universal [place] password for that web site. If you do not have the password, please call the hospital operator.  04/05/2020, 9:01 AM

## 2020-04-05 NOTE — NC FL2 (Signed)
[place] LEVEL OF CARE SCREENING TOOL     IDENTIFICATION  Patient Name: [name] Birthdate: 06/08/1945 Sex: female Admission Date (Current Location): 04/03/2020  [facility] and [place] Number:  Herbalist and Address:  [facility],  [place] [address] [place]      Provider Number: [id number]  Attending Physician Name and Address:  Nita Sells, MD  Relative Name and Phone Number:  [name] dtr [phone]    Current Level of Care:   Recommended Level of Care: Nursing Facility Prior Approval Number:    Date Approved/Denied:   PASRR Number:    Discharge Plan: Other (Comment) (LTC)    Current Diagnoses: Patient Active Problem List   Diagnosis Date Noted  . Open wound of heel 04/03/2020  . Osteomyelitis of toe ([place]) 02/22/2020  . Bleeding 01/27/2020  . CKD (chronic kidney disease) stage 3, GFR 30-59 ml/min (HCC) 01/27/2020  . Critical lower limb ischemia ([place]) 01/04/2020  . Skin pustule 10/20/2019  . Neurocognitive deficits 08/04/2019  . Controlled type 2 diabetes mellitus without complication ([place]) 87/68/1157  . CVA (cerebral vascular accident) ([place]) 07/23/2019  . Infection of pacemaker lead wire ([place]) 07/21/2019  . Fever   . History of transcatheter aortic valve replacement (TAVR)   . Septic encephalopathy 07/18/2019  . Sacral decubitus ulcer, stage II ([place]) 07/18/2019  . Bacteremia due to group B Streptococcus 07/18/2019  . Sepsis ([place]) 07/18/2019  . Complete heart block ([place])   . Chronic diastolic congestive heart failure ([place]) 02/07/2019  . AKI (acute kidney injury) ([place]) 07/22/2017  . Peripheral arterial disease ([place]) 06/18/2017  . B12 deficiency 05/01/2017  . Bilateral lower extremity edema 11/08/2015  . Aortic atherosclerosis ([place]) 06/07/2014  . Abnormality of gait 05/29/2012  . Constipation 03/16/2012  . HOCM (hypertrophic obstructive cardiomyopathy) ([place]) 06/11/2011  . Routine health  maintenance 06/10/2010  . Hyperlipidemia   . Refusal of blood transfusions as patient is Jehovah's Witness   . History of adenocarcinoma of breast   . Osteoporosis   . Status post CVA   . Aortic stenosis s/p Tissure AVR 2006   . CAD (coronary artery disease)   . Hypertension   . Depression 01/23/2006  . DM (diabetes mellitus) ([place]) 03/25/1990  . Benign hypertensive heart and kidney disease with diastolic CHF, NYHA class 3 and CKD stage 3 ([place]) 1992  . Bradycardia 1992    Orientation RESPIRATION BLADDER Height & Weight     Self,Time,Situation,Place  Normal Incontinent Weight: 62.1 kg Height:  $Remove'5\' 6"'QDnatuG$  (167.6 cm)  BEHAVIORAL SYMPTOMS/MOOD NEUROLOGICAL BOWEL NUTRITION STATUS      Incontinent Diet (CHO MOD)  AMBULATORY STATUS COMMUNICATION OF NEEDS Skin   Extensive Assist Verbally PU Stage and Appropriate Care                       Personal Care Assistance Level of Assistance  Bathing,Feeding,Dressing Bathing Assistance: Maximum assistance Feeding assistance: Limited assistance Dressing Assistance: Maximum assistance     Functional Limitations Info  Sight,Hearing,Speech Sight Info: Adequate Hearing Info: Adequate Speech Info: Adequate    SPECIAL CARE FACTORS FREQUENCY   (Bed bound)                    Contractures Contractures Info: Not present    Additional Factors Info  Code Status,Allergies,Psychotropic,Insulin Sliding Scale (Full code) Code Status Info:  (Full code) Allergies Info:  (Doxycycline) Psychotropic Info:  (remeron,cymbalta) Insulin Sliding Scale Info:  (  SSI see MAR)       Current Medications (04/05/2020):  This is the current hospital active medication list Current Facility-Administered Medications  Medication Dose Route Frequency Provider Last Rate Last Admin  . 0.9 %  sodium chloride infusion   Intravenous Continuous Nita Sells, MD 75 mL/hr at 04/05/20 0352 New Bag at 04/05/20 0352  . acetaminophen (TYLENOL) tablet 650 mg  650 mg  Oral Q6H PRN Marylyn Ishihara, Tyrone A, DO       Or  . acetaminophen (TYLENOL) suppository 650 mg  650 mg Rectal Q6H PRN Marylyn Ishihara, Tyrone A, DO      . aspirin EC tablet 81 mg  81 mg Oral Daily Kyle, Tyrone A, DO   81 mg at 04/05/20 1158  . atorvastatin (LIPITOR) tablet 80 mg  80 mg Oral QHS Kyle, Tyrone A, DO   80 mg at 04/04/20 2316  . collagenase (SANTYL) ointment   Topical Daily Cherylann Ratel A, DO   Given at 04/05/20 1014  . DULoxetine (CYMBALTA) DR capsule 30 mg  30 mg Oral Daily Kyle, Tyrone A, DO   30 mg at 04/05/20 1159  . ertapenem (INVANZ) 500 mg in sodium chloride 0.9 % 50 mL IVPB  500 mg Intravenous Q24H Nita Sells, MD 100 mL/hr at 04/05/20 1225 500 mg at 04/05/20 1225  . gabapentin (NEURONTIN) capsule 100 mg  100 mg Oral QHS Kyle, Tyrone A, DO   100 mg at 04/04/20 2317  . heparin injection 5,000 Units  5,000 Units Subcutaneous Q8H Kyle, Tyrone A, DO   5,000 Units at 04/05/20 1304  . HYDROcodone-acetaminophen (NORCO/VICODIN) 5-325 MG per tablet 1-2 tablet  1-2 tablet Oral Q4H PRN Marylyn Ishihara, Tyrone A, DO   2 tablet at 04/05/20 0017  . insulin aspart (novoLOG) injection 0-15 Units  0-15 Units Subcutaneous TID WC Kyle, Tyrone A, DO   2 Units at 04/04/20 1738  . lactulose (CHRONULAC) 10 GM/15ML solution 20 g  20 g Oral Daily Kyle, Tyrone A, DO   20 g at 04/05/20 1203  . melatonin tablet 6 mg  6 mg Oral QPM Kyle, Tyrone A, DO   6 mg at 04/04/20 1745  . metoprolol tartrate (LOPRESSOR) tablet 12.5 mg  12.5 mg Oral BID Marylyn Ishihara, Tyrone A, DO   12.5 mg at 04/04/20 2315  . mirtazapine (REMERON) tablet 7.5 mg  7.5 mg Oral QHS Kyle, Tyrone A, DO   7.5 mg at 04/04/20 2315  . multivitamin with minerals tablet 1 tablet  1 tablet Oral Daily Kyle, Tyrone A, DO   1 tablet at 04/05/20 1158  . ondansetron (ZOFRAN) tablet 4 mg  4 mg Oral Q12H PRN Marylyn Ishihara, Tyrone A, DO      . saccharomyces boulardii (FLORASTOR) capsule 250 mg  250 mg Oral BID Marylyn Ishihara, Tyrone A, DO   250 mg at 04/05/20 1159     Discharge Medications: Please  see discharge summary for a list of discharge medications.  Relevant Imaging Results:  Relevant Lab Results:   Additional Information  [phone])  Michon Kaczmarek, Juliann Pulse, RN

## 2020-04-05 NOTE — Procedures (Signed)
1.  Excisional vs non-excisional. Excisional  2.  Tool used for debridement (curette, scapel, etc.)  #10 blade scalpel   3.  Frequency of surgical debridement.   once  4.  Measurement of total devitalized tissue (wound surface) before and after surgical debridement.    Before: 5x5cm, no depth After 5x5x2cm  5.  Area and depth of devitalized tissue removed from wound.   50cm^3, 5x5x2cm removed   6.  Blood loss and description of tissue removed.  Minimal blood loss, Liquifed necrotic adipose tissue as well as leathery eschar from outer most part of the wound   7.  Evidence of the progress of the wound's response to treatment.  A.  Current wound volume (current dimensions and depth).  7x6x2cm   B.  Presence (and extent of) of infection.  No infection  C.  Presence (and extent of) of non viable tissue.  50cm^3 of necrotic tissue.    D.  Other material in the wound that is expected to inhibit healing.  none  8.  Was there any viable tissue removed (measurements): no  [name] 10:27 AM 04/05/2020

## 2020-04-05 NOTE — TOC Initial Note (Signed)
Transition of Care [facility]) - Initial/Assessment Note    Patient Details  Name: [name] MRN: [id number] Date of Birth: 1945-07-10  [facility]) CM/SW Contact:    Dessa Phi, RN Phone Number: 04/05/2020, 3:22 PM  Clinical Narrative:   From [facility]-LTC-w/c bound- d/c plan to return. Await medical stability.                Expected Discharge Plan: Long Term Nursing Home Barriers to Discharge: Continued Medical Work up   Patient Goals and CMS Choice Patient states their goals for this hospitalization and ongoing recovery are:: return back to [facility] CMS Medicare.gov Compare Post Acute Care list provided to:: Patient Choice offered to / list presented to : Patient  Expected Discharge Plan and Services Expected Discharge Plan: [place]                                              Prior Living Arrangements/Services   Lives with:: Facility Resident Patient language and need for interpreter reviewed:: Yes Do you feel safe going back to the place where you live?: Yes      Need for Family Participation in Patient Care: No (Comment) Care giver support system in place?: Yes (comment)   Criminal Activity/Legal Involvement Pertinent to Current Situation/Hospitalization: No - Comment as needed  Activities of Daily Living Home Assistive Devices/Equipment: Hospital bed,Eyeglasses,Dentures (specify type) (upper and lower partials) ADL Screening (condition at time of admission) Patient's cognitive ability adequate to safely complete daily activities?: Yes Is the patient deaf or have difficulty hearing?: No Does the patient have difficulty seeing, even when wearing glasses/contacts?: No Does the patient have difficulty concentrating, remembering, or making decisions?: No Patient able to express need for assistance with ADLs?: Yes Does the patient have difficulty dressing or bathing?: Yes Independently performs ADLs?: No Communication:  Independent Dressing (OT): Needs assistance Is this a change from baseline?: Pre-admission baseline Grooming: Independent Feeding: Independent Bathing: Needs assistance Is this a change from baseline?: Pre-admission baseline Toileting: Needs assistance Is this a change from baseline?: Pre-admission baseline In/Out Bed: Needs assistance Is this a change from baseline?: Pre-admission baseline Walks in Home: Dependent Is this a change from baseline?: Pre-admission baseline Does the patient have difficulty walking or climbing stairs?: Yes Weakness of Legs: Both Weakness of Arms/Hands: Both  Permission Sought/Granted Permission sought to share information with : Case Manager Permission granted to share information with : Yes, Verbal Permission Granted  Share Information with NAME: Case Manager     Permission granted to share info w Relationship: [name] dtr [phone]     Emotional Assessment Appearance:: Appears stated age Attitude/Demeanor/Rapport: Gracious Affect (typically observed): Accepting Orientation: : Oriented to Self,Oriented to Place,Oriented to  Time,Oriented to Situation Alcohol / Substance Use: Not Applicable Psych Involvement: No (comment)  Admission diagnosis:  Wound infection [T14.8XXA, L08.9] Toe osteomyelitis ([place]) [M86.9] Open wound of heel [S91.309A] Pressure injury of skin of right heel, unspecified injury stage [L89.619] Pressure injury of skin of sacral region, unspecified injury stage [L89.159] Patient Active Problem List   Diagnosis Date Noted  . Open wound of heel 04/03/2020  . Osteomyelitis of toe ([place]) 02/22/2020  . Bleeding 01/27/2020  . CKD (chronic kidney disease) stage 3, GFR 30-59 ml/min (HCC) 01/27/2020  . Critical lower limb ischemia ([place]) 01/04/2020  . Skin pustule 10/20/2019  . Neurocognitive  deficits 08/04/2019  . Controlled type 2 diabetes mellitus without complication ([place]) 23/79/9094  . CVA (cerebral vascular accident)  ([place]) 07/23/2019  . Infection of pacemaker lead wire ([place]) 07/21/2019  . Fever   . History of transcatheter aortic valve replacement (TAVR)   . Septic encephalopathy 07/18/2019  . Sacral decubitus ulcer, stage II ([place]) 07/18/2019  . Bacteremia due to group B Streptococcus 07/18/2019  . Sepsis ([place]) 07/18/2019  . Complete heart block ([place])   . Chronic diastolic congestive heart failure ([place]) 02/07/2019  . AKI (acute kidney injury) ([place]) 07/22/2017  . Peripheral arterial disease ([place]) 06/18/2017  . B12 deficiency 05/01/2017  . Bilateral lower extremity edema 11/08/2015  . Aortic atherosclerosis ([place]) 06/07/2014  . Abnormality of gait 05/29/2012  . Constipation 03/16/2012  . HOCM (hypertrophic obstructive cardiomyopathy) ([place]) 06/11/2011  . Routine health maintenance 06/10/2010  . Hyperlipidemia   . Refusal of blood transfusions as patient is Jehovah's Witness   . History of adenocarcinoma of breast   . Osteoporosis   . Status post CVA   . Aortic stenosis s/p Tissure AVR 2006   . CAD (coronary artery disease)   . Hypertension   . Depression 01/23/2006  . DM (diabetes mellitus) ([place]) 03/25/1990  . Benign hypertensive heart and kidney disease with diastolic CHF, NYHA class 3 and CKD stage 3 ([place]) 1992  . Bradycardia 1992   PCP:  Hendricks Limes, MD Pharmacy:   [facility] [place] [address] Phone: [phone] Fax: [phone]     Social Determinants of Health (SDOH) Interventions    Readmission Risk Interventions No flowsheet data found.

## 2020-04-05 NOTE — Telephone Encounter (Signed)
I called the daughter she did not answer and left a voicemail

## 2020-04-05 NOTE — Progress Notes (Signed)
Podiatry Progress Note  Subjective: [name] is a 75 y.o. female patient seen at bedside, resting comfortably in no acute distress for follow up evaluation of right foot wounds. Patient is slumped over in bed but when awakened she reports that she feels ok. Denies any pain to both lower extremities and denies any acute constitutional symptoms at this time.   Patient Active Problem List   Diagnosis Date Noted  . Open wound of heel 04/03/2020  . Osteomyelitis of toe ([place]) 02/22/2020  . Bleeding 01/27/2020  . CKD (chronic kidney disease) stage 3, GFR 30-59 ml/min (HCC) 01/27/2020  . Critical lower limb ischemia ([place]) 01/04/2020  . Skin pustule 10/20/2019  . Neurocognitive deficits 08/04/2019  . Controlled type 2 diabetes mellitus without complication ([place]) 94/76/5465  . CVA (cerebral vascular accident) ([place]) 07/23/2019  . Infection of pacemaker lead wire ([place]) 07/21/2019  . Fever   . History of transcatheter aortic valve replacement (TAVR)   . Septic encephalopathy 07/18/2019  . Sacral decubitus ulcer, stage II ([place]) 07/18/2019  . Bacteremia due to group B Streptococcus 07/18/2019  . Sepsis ([place]) 07/18/2019  . Complete heart block ([place])   . Chronic diastolic congestive heart failure ([place]) 02/07/2019  . AKI (acute kidney injury) ([place]) 07/22/2017  . Peripheral arterial disease ([place]) 06/18/2017  . B12 deficiency 05/01/2017  . Bilateral lower extremity edema 11/08/2015  . Aortic atherosclerosis ([place]) 06/07/2014  . Abnormality of gait 05/29/2012  . Constipation 03/16/2012  . HOCM (hypertrophic obstructive cardiomyopathy) ([place]) 06/11/2011  . Routine health maintenance 06/10/2010  . Hyperlipidemia   . Refusal of blood transfusions as patient is Jehovah's Witness   . History of adenocarcinoma of breast   . Osteoporosis   . Status post CVA   . Aortic stenosis s/p Tissure AVR 2006   . CAD (coronary artery disease)   . Hypertension   . Depression 01/23/2006  . DM (diabetes  mellitus) ([place]) 03/25/1990  . Benign hypertensive heart and kidney disease with diastolic CHF, NYHA class 3 and CKD stage 3 ([place]) 1992  . Bradycardia 1992     Current Facility-Administered Medications:  .  0.9 %  sodium chloride infusion, , Intravenous, Continuous, Samtani, Jai-Gurmukh, MD, Last Rate: 75 mL/hr at 04/05/20 0352, New Bag at 04/05/20 0352 .  acetaminophen (TYLENOL) tablet 650 mg, 650 mg, Oral, Q6H PRN **OR** acetaminophen (TYLENOL) suppository 650 mg, 650 mg, Rectal, Q6H PRN, Marylyn Ishihara, Tyrone A, DO .  aspirin EC tablet 81 mg, 81 mg, Oral, Daily, Kyle, Tyrone A, DO, 81 mg at 04/04/20 1056 .  atorvastatin (LIPITOR) tablet 80 mg, 80 mg, Oral, QHS, Kyle, Tyrone A, DO, 80 mg at 04/04/20 2316 .  collagenase (SANTYL) ointment, , Topical, Daily, Cherylann Ratel A, DO, 1 application at 03/54/65 0949 .  DULoxetine (CYMBALTA) DR capsule 30 mg, 30 mg, Oral, Daily, Kyle, Tyrone A, DO, 30 mg at 04/04/20 1056 .  ertapenem (INVANZ) 500 mg in sodium chloride 0.9 % 50 mL IVPB, 500 mg, Intravenous, Q24H, Nita Sells, MD, Stopped at 04/04/20 1324 .  gabapentin (NEURONTIN) capsule 100 mg, 100 mg, Oral, QHS, Kyle, Tyrone A, DO, 100 mg at 04/04/20 2317 .  heparin injection 5,000 Units, 5,000 Units, Subcutaneous, Q8H, Kyle, Tyrone A, DO, 5,000 Units at 04/05/20 0559 .  HYDROcodone-acetaminophen (NORCO/VICODIN) 5-325 MG per tablet 1-2 tablet, 1-2 tablet, Oral, Q4H PRN, Marylyn Ishihara, Tyrone A, DO, 2 tablet at 04/05/20 0017 .  insulin aspart (novoLOG) injection 0-15 Units, 0-15 Units, Subcutaneous, TID WC, Kyle, Tyrone A, DO, 2  Units at 04/04/20 1738 .  lactulose (CHRONULAC) 10 GM/15ML solution 20 g, 20 g, Oral, Daily, Kyle, Tyrone A, DO, 20 g at 04/04/20 1059 .  melatonin tablet 6 mg, 6 mg, Oral, QPM, Kyle, Tyrone A, DO, 6 mg at 04/04/20 1745 .  metoprolol tartrate (LOPRESSOR) tablet 12.5 mg, 12.5 mg, Oral, BID, Kyle, Tyrone A, DO, 12.5 mg at 04/04/20 2315 .  mirtazapine (REMERON) tablet 7.5 mg, 7.5 mg, Oral,  QHS, Kyle, Tyrone A, DO, 7.5 mg at 04/04/20 2315 .  multivitamin with minerals tablet 1 tablet, 1 tablet, Oral, Daily, Kyle, Tyrone A, DO, 1 tablet at 04/04/20 1055 .  ondansetron (ZOFRAN) tablet 4 mg, 4 mg, Oral, Q12H PRN, Marylyn Ishihara, Tyrone A, DO .  saccharomyces boulardii (FLORASTOR) capsule 250 mg, 250 mg, Oral, BID, Kyle, Tyrone A, DO, 250 mg at 04/04/20 2315  Allergies  Allergen Reactions  . 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," as the patient is a Jehovah's Witness     Objective: Today's Vitals   04/05/20 0014 04/05/20 0018 04/05/20 0102 04/05/20 0500  BP: 122/86   120/80  Pulse: (!) 59   61  Resp: 18   18  Temp: 98 F (36.7 C)   98 F (36.7 C)  TempSrc: Oral   Oral  SpO2: 100%     Weight:  62.1 kg    Height:  $Remove'5\' 6"'HzYDGHy$  (1.676 m)    PainSc:  7  Asleep     General: No acute distress  Lower extremity: 4x4 guaze and heel boot noted to the right, Upon removal there is a full thickness fibrogranular wound to the right posterior heel that measures less than 4cm with central area that probes to deep soft tissue, no obvious bone exposure, no significant odor, no active drainage, no significant redness, or warmth. To the right great toe there is a superifical dry scab that does not appear to be acutely infected. Appears dry and stable. Left foot no wounds  Pedal pulses are non palpable No significant tenderness to feet or calves.    Assessment and Plan:  Problem List Items Addressed This Visit   None   Visit Diagnoses    Pressure injury of skin of sacral region, unspecified injury stage    -  Primary   Wound infection       Relevant Medications   cefdinir (OMNICEF) 300 MG capsule   cefTRIAXone (ROCEPHIN) 1 g injection   ertapenem (INVANZ) 500 mg in sodium chloride 0.9 % 50 mL IVPB   Pressure injury of skin of right heel, unspecified injury stage       Toe osteomyelitis (HCC)       Relevant Medications   cefdinir (OMNICEF) 300 MG capsule   cefTRIAXone (ROCEPHIN) 1 g  injection   ertapenem (INVANZ) 500 mg in sodium chloride 0.9 % 50 mL IVPB      -Patient seen and evaluated at bedside -Wound check performed  -CT results reviewed with patient which suggest osteomyelitis -Advised patient that I will discuss plan of debridement with biopsy and graft on the right heel with daughter today; possible plan for OR tomorrow pending discussion with daughter -Patient is noted to be very frail so there is risk with this procedure that I will thoroughly discuss with the daughter; if this procedure is done may have to be done under strict local and very light sedation.  In addition due to patient's poor vascular status there is still possibility of failure to heal.  -  Continue with offloading boot to heel on right to prevent worsening of heel ulceration -Continue with empiric antibiotics at this time  -Continue with rest and elevation to assist with pain and edema control  -Podiatry will continue to follow closely    Landis Martins, DPM [facility] [phone] office [phone] cell

## 2020-04-05 NOTE — Plan of Care (Signed)
  Problem: Education: Goal: Knowledge of General Education information will improve Description: Including pain rating scale, medication(s)/side effects and non-pharmacologic comfort measures Outcome: Progressing   Problem: Health Behavior/Discharge Planning: Goal: Ability to manage health-related needs will improve Outcome: Progressing   Problem: Clinical Measurements: Goal: Ability to maintain clinical measurements within normal limits will improve Outcome: Progressing Goal: Will remain free from infection Outcome: Progressing Goal: Diagnostic test results will improve Outcome: Progressing Goal: Respiratory complications will improve Outcome: Progressing Goal: Cardiovascular complication will be avoided Outcome: Progressing   Problem: Activity: Goal: Risk for activity intolerance will decrease Outcome: Progressing   Problem: Nutrition: Goal: Adequate nutrition will be maintained Outcome: Progressing   Problem: Coping: Goal: Level of anxiety will decrease Outcome: Progressing   Problem: Elimination: Goal: Will not experience complications related to bowel motility Outcome: Progressing Goal: Will not experience complications related to urinary retention Outcome: Progressing   Problem: Pain Managment: Goal: General experience of comfort will improve Outcome: Progressing   Problem: Safety: Goal: Ability to remain free from injury will improve Outcome: Progressing   Problem: Skin Integrity: Goal: Risk for impaired skin integrity will decrease Outcome: Progressing   Problem: Education: Goal: Ability to describe self-care measures that may prevent or decrease complications (Diabetes Survival Skills Education) will improve Outcome: Progressing Goal: Individualized Educational Video(s) Outcome: Progressing   Problem: Coping: Goal: Ability to adjust to condition or change in health will improve Outcome: Progressing   Problem: Fluid Volume: Goal: Ability to  maintain a balanced intake and output will improve Outcome: Progressing   Problem: Health Behavior/Discharge Planning: Goal: Ability to identify and utilize available resources and services will improve Outcome: Progressing Goal: Ability to manage health-related needs will improve Outcome: Progressing   Problem: Metabolic: Goal: Ability to maintain appropriate glucose levels will improve Outcome: Progressing   Problem: Nutritional: Goal: Maintenance of adequate nutrition will improve Outcome: Progressing Goal: Progress toward achieving an optimal weight will improve Outcome: Progressing   Problem: Skin Integrity: Goal: Risk for impaired skin integrity will decrease Outcome: Progressing   Problem: Tissue Perfusion: Goal: Adequacy of tissue perfusion will improve Outcome: Progressing   Problem: Education: Goal: Knowledge of disease and its progression will improve Outcome: Progressing Goal: Individualized Educational Video(s) Outcome: Progressing   Problem: Fluid Volume: Goal: Compliance with measures to maintain balanced fluid volume will improve Outcome: Progressing   Problem: Health Behavior/Discharge Planning: Goal: Ability to manage health-related needs will improve Outcome: Progressing   Problem: Nutritional: Goal: Ability to make healthy dietary choices will improve Outcome: Progressing   Problem: Clinical Measurements: Goal: Complications related to the disease process, condition or treatment will be avoided or minimized Outcome: Progressing

## 2020-04-06 ENCOUNTER — Encounter (HOSPITAL_COMMUNITY): Payer: Self-pay | Admitting: Internal Medicine

## 2020-04-06 ENCOUNTER — Inpatient Hospital Stay (HOSPITAL_COMMUNITY): Payer: Medicare Other | Admitting: Anesthesiology

## 2020-04-06 ENCOUNTER — Encounter (HOSPITAL_COMMUNITY)
Admission: EM | Disposition: A | Discharge status: Skilled Nursing Facility | Payer: Self-pay | Source: Home / Self Care | Attending: Family Medicine

## 2020-04-06 DIAGNOSIS — M86671 Other chronic osteomyelitis, right ankle and foot: Secondary | ICD-10-CM

## 2020-04-06 DIAGNOSIS — L89159 Pressure ulcer of sacral region, unspecified stage: Secondary | ICD-10-CM | POA: Diagnosis not present

## 2020-04-06 HISTORY — PX: INCISION AND DRAINAGE OF WOUND: SHX1803

## 2020-04-06 HISTORY — PX: BONE BIOPSY: SHX375

## 2020-04-06 LAB — CBC WITH DIFFERENTIAL/PLATELET
Abs Immature Granulocytes: 0.32 10*3/uL — ABNORMAL HIGH (ref 0.00–0.07)
Basophils Absolute: 0.1 10*3/uL (ref 0.0–0.1)
Basophils Relative: 0 %
Eosinophils Absolute: 0.3 10*3/uL (ref 0.0–0.5)
Eosinophils Relative: 1 %
HCT: 30 % — ABNORMAL LOW (ref 36.0–46.0)
Hemoglobin: 9.7 g/dL — ABNORMAL LOW (ref 12.0–15.0)
Immature Granulocytes: 2 %
Lymphocytes Relative: 11 %
Lymphs Abs: 2.2 10*3/uL (ref 0.7–4.0)
MCH: 30.6 pg (ref 26.0–34.0)
MCHC: 32.3 g/dL (ref 30.0–36.0)
MCV: 94.6 fL (ref 80.0–100.0)
Monocytes Absolute: 1.7 10*3/uL — ABNORMAL HIGH (ref 0.1–1.0)
Monocytes Relative: 8 %
Neutro Abs: 16.4 10*3/uL — ABNORMAL HIGH (ref 1.7–7.7)
Neutrophils Relative %: 78 %
Platelets: 292 10*3/uL (ref 150–400)
RBC: 3.17 MIL/uL — ABNORMAL LOW (ref 3.87–5.11)
RDW: 17.1 % — ABNORMAL HIGH (ref 11.5–15.5)
WBC: 21 10*3/uL — ABNORMAL HIGH (ref 4.0–10.5)
nRBC: 0 % (ref 0.0–0.2)

## 2020-04-06 LAB — GLUCOSE, CAPILLARY
Glucose-Capillary: 112 mg/dL — ABNORMAL HIGH (ref 70–99)
Glucose-Capillary: 90 mg/dL (ref 70–99)
Glucose-Capillary: 82 mg/dL (ref 70–99)
Glucose-Capillary: 79 mg/dL (ref 70–99)
Glucose-Capillary: 92 mg/dL (ref 70–99)

## 2020-04-06 LAB — COMPREHENSIVE METABOLIC PANEL
ALT: 18 U/L (ref 0–44)
AST: 18 U/L (ref 15–41)
Albumin: 1.6 g/dL — ABNORMAL LOW (ref 3.5–5.0)
Alkaline Phosphatase: 129 U/L — ABNORMAL HIGH (ref 38–126)
Anion gap: 10 (ref 5–15)
BUN: 88 mg/dL — ABNORMAL HIGH (ref 8–23)
CO2: 19 mmol/L — ABNORMAL LOW (ref 22–32)
Calcium: 8 mg/dL — ABNORMAL LOW (ref 8.9–10.3)
Chloride: 102 mmol/L (ref 98–111)
Creatinine, Ser: 2.23 mg/dL — ABNORMAL HIGH (ref 0.44–1.00)
GFR, Estimated: 22 mL/min — ABNORMAL LOW (ref >60)
Glucose, Bld: 117 mg/dL — ABNORMAL HIGH (ref 70–99)
Potassium: 4.6 mmol/L (ref 3.5–5.1)
Sodium: 131 mmol/L — ABNORMAL LOW (ref 135–145)
Total Bilirubin: 0.5 mg/dL (ref 0.3–1.2)
Total Protein: 5.6 g/dL — ABNORMAL LOW (ref 6.5–8.1)

## 2020-04-06 LAB — MRSA PCR SCREENING: MRSA by PCR: NEGATIVE

## 2020-04-06 SURGERY — IRRIGATION AND DEBRIDEMENT WOUND
Anesthesia: Monitor Anesthesia Care | Laterality: Right

## 2020-04-06 MED ORDER — BUPIVACAINE HCL 0.25 % IJ SOLN
INTRAMUSCULAR | Status: AC
  Filled 2020-04-06: qty 1

## 2020-04-06 MED ORDER — PROPOFOL 500 MG/50ML IV EMUL
INTRAVENOUS | Status: DC | PRN
  Administered 2020-04-06: 50 ug/kg/min via INTRAVENOUS

## 2020-04-06 MED ORDER — METOPROLOL SUCCINATE ER 25 MG PO TB24
25.0000 mg | ORAL_TABLET | Freq: Every day | ORAL | Status: DC
  Administered 2020-04-06: 25 mg via ORAL
  Filled 2020-04-06: qty 1

## 2020-04-06 MED ORDER — BUPIVACAINE HCL 0.25 % IJ SOLN
INTRAMUSCULAR | Status: DC | PRN
  Administered 2020-04-06: 20 mL

## 2020-04-06 MED ORDER — CHLORHEXIDINE GLUCONATE CLOTH 2 % EX PADS
6.0000 | MEDICATED_PAD | Freq: Every day | CUTANEOUS | Status: DC
  Administered 2020-04-06 – 2020-04-11 (×6): 6 via TOPICAL

## 2020-04-06 MED ORDER — SODIUM CHLORIDE 0.9 % IV BOLUS
500.0000 mL | Freq: Once | INTRAVENOUS | Status: AC
  Administered 2020-04-06: 500 mL via INTRAVENOUS

## 2020-04-06 MED ORDER — LIDOCAINE HCL (PF) 1 % IJ SOLN
INTRAMUSCULAR | Status: AC
  Filled 2020-04-06: qty 30

## 2020-04-06 MED ORDER — LIDOCAINE HCL 1 % IJ SOLN
INTRAMUSCULAR | Status: DC | PRN
  Administered 2020-04-06: 20 mL

## 2020-04-06 MED ORDER — EPHEDRINE SULFATE 50 MG/ML IJ SOLN
INTRAMUSCULAR | Status: DC | PRN
  Administered 2020-04-06 (×3): 10 mg via INTRAVENOUS
  Administered 2020-04-06: 15 mg via INTRAVENOUS
  Administered 2020-04-06: 10 mg via INTRAVENOUS

## 2020-04-06 MED ORDER — PHENYLEPHRINE HCL (PRESSORS) 10 MG/ML IV SOLN
INTRAVENOUS | Status: DC | PRN
  Administered 2020-04-06: 80 ug via INTRAVENOUS
  Administered 2020-04-06: 120 ug via INTRAVENOUS
  Administered 2020-04-06: 160 ug via INTRAVENOUS
  Administered 2020-04-06: 80 ug via INTRAVENOUS
  Administered 2020-04-06: 120 ug via INTRAVENOUS
  Administered 2020-04-06: 80 ug via INTRAVENOUS

## 2020-04-06 MED ORDER — 0.9 % SODIUM CHLORIDE (POUR BTL) OPTIME
TOPICAL | Status: DC | PRN
  Administered 2020-04-06: 1000 mL

## 2020-04-06 MED ORDER — PHENYLEPHRINE HCL-NACL 10-0.9 MG/250ML-% IV SOLN
INTRAVENOUS | Status: DC | PRN
  Administered 2020-04-06: 50 ug/min via INTRAVENOUS

## 2020-04-06 MED ORDER — LACTATED RINGERS IV SOLN: INTRAVENOUS | Status: DC | PRN

## 2020-04-06 MED ORDER — LACTATED RINGERS IV SOLN: Freq: Once | INTRAVENOUS | Status: AC

## 2020-04-06 SURGICAL SUPPLY — 54 items
BLADE SURG 15 STRL LF DISP TIS (BLADE) ×2 IMPLANT
BLADE SURG 15 STRL SS (BLADE) ×6
BNDG CONFORM 2 STRL LF (GAUZE/BANDAGES/DRESSINGS) ×3 IMPLANT
BNDG ELASTIC 3X5.8 VLCR STR LF (GAUZE/BANDAGES/DRESSINGS) ×3 IMPLANT
BNDG ELASTIC 4X5.8 VLCR STR LF (GAUZE/BANDAGES/DRESSINGS) ×4 IMPLANT
BNDG ELASTIC 6X5.8 VLCR STR LF (GAUZE/BANDAGES/DRESSINGS) ×3 IMPLANT
BNDG ESMARK 4X9 LF (GAUZE/BANDAGES/DRESSINGS) ×3 IMPLANT
BNDG GAUZE ELAST 4 BULKY (GAUZE/BANDAGES/DRESSINGS) ×3 IMPLANT
BUR EGG ELITE 4.0 (BURR) ×2 IMPLANT
BUR EGG ELITE 4.0MM (BURR) ×1
COVER BACK TABLE 60X90IN (DRAPES) ×3 IMPLANT
COVER WAND RF STERILE (DRAPES) IMPLANT
CUFF TOURN SGL QUICK 18X4 (TOURNIQUET CUFF) IMPLANT
DRAPE EXTREMITY T 121X128X90 (DISPOSABLE) ×3 IMPLANT
DRAPE IMP U-DRAPE 54X76 (DRAPES) ×3 IMPLANT
DRAPE OEC MINIVIEW 54X84 (DRAPES) ×3 IMPLANT
DRSG ADAPTIC 3X8 NADH LF (GAUZE/BANDAGES/DRESSINGS) ×2 IMPLANT
DRSG EMULSION OIL 3X3 NADH (GAUZE/BANDAGES/DRESSINGS) ×3 IMPLANT
DRSG PAD ABDOMINAL 8X10 ST (GAUZE/BANDAGES/DRESSINGS) ×4 IMPLANT
DURAPREP 26ML APPLICATOR (WOUND CARE) IMPLANT
ELECT REM PT RETURN 15FT ADLT (MISCELLANEOUS) ×3 IMPLANT
GAUZE 4X4 16PLY RFD (DISPOSABLE) IMPLANT
GAUZE SPONGE 4X4 12PLY STRL (GAUZE/BANDAGES/DRESSINGS) ×3 IMPLANT
GLOVE SURG ENC MOIS LTX SZ7.5 (GLOVE) ×6 IMPLANT
GLOVE SURG LTX SZ7.5 (GLOVE) ×3 IMPLANT
GLOVE SURG UNDER POLY LF SZ7.5 (GLOVE) ×3 IMPLANT
GOWN STRL REUS W/ TWL LRG LVL3 (GOWN DISPOSABLE) ×1 IMPLANT
GOWN STRL REUS W/TWL LRG LVL3 (GOWN DISPOSABLE) ×3
KIT BASIN OR (CUSTOM PROCEDURE TRAY) ×3 IMPLANT
MATRIX WOUND 3-LAYER 7X10 (Tissue) ×1 IMPLANT
MICROMATRIX 1000MG (Tissue) ×3 IMPLANT
NDL HYPO 25X1 1.5 SAFETY (NEEDLE) ×2 IMPLANT
NDL SAFETY ECLIPSE 18X1.5 (NEEDLE) IMPLANT
NEEDLE HYPO 18GX1.5 SHARP (NEEDLE)
NEEDLE HYPO 25X1 1.5 SAFETY (NEEDLE) ×6 IMPLANT
NS IRRIG 1000ML POUR BTL (IV SOLUTION) IMPLANT
PADDING CAST ABS 4INX4YD NS (CAST SUPPLIES) ×2
PADDING CAST ABS COTTON 4X4 ST (CAST SUPPLIES) ×1 IMPLANT
PENCIL SMOKE EVACUATOR (MISCELLANEOUS) ×3 IMPLANT
SOLUTION PARTIC MCRMTRX 1000MG (Tissue) IMPLANT
STOCKINETTE 6  STRL (DRAPES) ×3
STOCKINETTE 6 STRL (DRAPES) ×1 IMPLANT
STRIP SUTURE WOUND CLOSURE 1/2 (MISCELLANEOUS) IMPLANT
SUT MNCRL AB 3-0 PS2 18 (SUTURE) ×3 IMPLANT
SUT MNCRL AB 4-0 PS2 18 (SUTURE) IMPLANT
SUT MON AB 5-0 PS2 18 (SUTURE) IMPLANT
SUT PROLENE 3 0 PS 2 (SUTURE) ×3 IMPLANT
SYR 10ML LL (SYRINGE) IMPLANT
SYR BULB EAR ULCER 3OZ GRN STR (SYRINGE) ×3 IMPLANT
SYR CONTROL 10ML LL (SYRINGE) ×6 IMPLANT
TUBING CONNECTING 10 (TUBING) ×2 IMPLANT
TUBING CONNECTING 10' (TUBING) ×1
UNDERPAD 30X36 HEAVY ABSORB (UNDERPADS AND DIAPERS) ×3 IMPLANT
WOUND MATRIX 3-LAYER 7X10 (Tissue) ×1 IMPLANT

## 2020-04-06 NOTE — Progress Notes (Signed)
Patient was cold on PACU admit from OR (temp 96.0 temporal-would not register oral or axillary). Bair hugger initiated in PACU with slow and gradual increase in patients temp to WNL (97.3 axillary). Bair hugger turned off and warm blankets applied, but patient's temp had dropped when re-checked (96.9 temporal), so Bair hugger was resumed and  PACU Anesthesiologist Dr.Turk was called to notify of patient temperature readings on & off Bair hugger, and that [facility] had been resumed. Received verbal order to transfer patient back to her room with Bair hugger in use, increase the patient's temp in her room on the floor, and have 4E nurse wean her off the [facility] as pt temp allows. Patient received in the room by 4E nurse Garen Grams, RN w/bair hugger in use for patient. She assumed care of patient after updated report given and Bair Hugger use was reviewed and discussed w/her.

## 2020-04-06 NOTE — Care Management Important Message (Signed)
Medicare IM printed for [name], NCM to give to the patient.

## 2020-04-06 NOTE — H&P (Signed)
Anesthesia H&P Update: History and Physical Exam reviewed; patient is OK for planned anesthetic and procedure. ? ?

## 2020-04-06 NOTE — Anesthesia Preprocedure Evaluation (Signed)
Anesthesia Evaluation  Patient identified by MRN, date of birth, ID band Patient awake and Patient confused    Reviewed: Allergy & Precautions, NPO status , Patient's Chart, lab work & pertinent test results, reviewed documented beta blocker date and time   Airway Mallampati: II  TM Distance: >3 FB Neck ROM: Full    Dental  (+) Dental Advisory Given, Poor Dentition, Missing,    Pulmonary neg pulmonary ROS,  Covid 2019    Pulmonary exam normal breath sounds clear to auscultation       Cardiovascular hypertension, Pt. on medications and Pt. on home beta blockers + CAD, + CABG, + Peripheral Vascular Disease and +CHF  Normal cardiovascular exam+ dysrhythmias + pacemaker + Valvular Problems/Murmurs (s/p TAVR, Mitral stenosis)  Rhythm:Regular Rate:Normal  Echo 03/14/20: 1. Small underfilled LV with mid/apical cavity obliteration in systole  Mitral valve disease with MS likely contributes to low pre load and  underfilling Appears to be a significant mid cavity gradient with velocity  2.9 m/sec at rest with HR 67 bpm This  increases to velocity of 3.5 m/sec with valsalva . Left ventricular  ejection fraction, by estimation, is 60 to 65%. The left ventricle has  normal function. The left ventricle has no regional wall motion  abnormalities. There is mild left ventricular  hypertrophy. Left ventricular diastolic parameters are indeterminate.  2. Right ventricular systolic function is normal. The right ventricular  size is normal. There is normal pulmonary artery systolic pressure.  3. Left atrial size was mildly dilated.  4. The mitral valve is degenerative. No evidence of mitral valve  regurgitation. Moderate mitral stenosis. Severe mitral annular  calcification.  5. Post TAVR with 23 mm Medtronic Evolut valve No PVL mean gradient 7  peak 12 mmHg . The aortic valve has been repaired/replaced. Aortic valve  regurgitation is not  visualized. No aortic stenosis is present. There is a  23 mm Evolt Proplus valve present  in the aortic position. Procedure Date: 03/01/19.  6. The inferior vena cava is normal in size with greater than 50%  respiratory variability, suggesting right atrial pressure of 3 mmHg.    Neuro/Psych PSYCHIATRIC DISORDERS Depression CVA, Residual Symptoms    GI/Hepatic negative GI ROS, Neg liver ROS,   Endo/Other  diabetes, Type 2, Insulin Dependent, Oral Hypoglycemic Agents  Renal/GU Renal InsufficiencyRenal disease     Musculoskeletal  (+) Arthritis , Osteomyelitis of right heel-failed outpatient therapy on multiple agents   Abdominal   Peds  Hematology  (+) Blood dyscrasia (Plavix), anemia , REFUSES BLOOD PRODUCTS, JEHOVAH'S WITNESS  Anesthesia Other Findings Day of surgery medications reviewed with the patient.  Breast cancer status postmastectomy and chemotherapy  Reproductive/Obstetrics                            Anesthesia Physical Anesthesia Plan  ASA: IV  Anesthesia Plan: MAC   Post-op Pain Management:    Induction: Intravenous  PONV Risk Score and Plan: 2 and Propofol infusion and Treatment may vary due to age or medical condition  Airway Management Planned: Nasal Cannula and Natural Airway  Additional Equipment:   Intra-op Plan:   Post-operative Plan:   Informed Consent: I have reviewed the patients History and Physical, chart, labs and discussed the procedure including the risks, benefits and alternatives for the proposed anesthesia with the patient or authorized representative who has indicated his/her understanding and acceptance.     Dental advisory given  Plan Discussed with:  CRNA  Anesthesia Plan Comments:         Anesthesia Quick Evaluation

## 2020-04-06 NOTE — Plan of Care (Signed)
  Problem: Education: Goal: Knowledge of General Education information will improve Description: Including pain rating scale, medication(s)/side effects and non-pharmacologic comfort measures Outcome: Progressing   Problem: Coping: Goal: Level of anxiety will decrease Outcome: Progressing   Problem: Elimination: Goal: Will not experience complications related to bowel motility Outcome: Progressing   Problem: Pain Managment: Goal: General experience of comfort will improve Outcome: Progressing   Problem: Safety: Goal: Ability to remain free from injury will improve Outcome: Progressing   Problem: Skin Integrity: Goal: Risk for impaired skin integrity will decrease Outcome: Progressing   Problem: Coping: Goal: Ability to adjust to condition or change in health will improve Outcome: Progressing   Problem: Education: Goal: Knowledge of disease and its progression will improve Outcome: Progressing

## 2020-04-06 NOTE — Op Note (Signed)
DATE: 04/06/20  SURGEON: Landis Martins, DPM  PREOPERATIVE DIAGNOSIS: Chronic Right foot pressure ulcer at heel with osteomyelitis     POSTOPERATIVE DIAGNOSIS:Same     PROCEDURE PERFORMED:Right wound debridement and removal of bone/bone biopsy and placement of Cytal wound graft     HEMOSTASIS: Right ankle tourniquet     ESTIMATED BLOOD LOSS: 50cc     ANESTHESIA: MAC with local 20cc of 1:1 mixture 1% lidocaine and 0.25% marcaine plain    SPECIMENS: Bone    COMPLICATIONS: None.   INDICATIONS FOR PROCEDURE: This patient is a pleasant 75 y.o. Female who has been seen on many occassions for Right foot ulceration without resolution with conservative wound care and + CT for osteomyelitis. At this time, all risks, complications, benefits, and alternatives were explained in detail to the patient. Patient opts for Right foot wound debridement and removal of bone (heel) with placement of graft. Risks and complications include but are not limited to infection, recurrence of symptoms, pain, numbness, wound dehiscence, delayed healing, as well as need for future surgery/amputation. No guarantees were given or applied. All questions were answered to the patient's satisfaction, and the patient has consented to the above procedure. All preoperative labs and H&P, medical clearances have been obtained and NPO status past midnight has been confirmed.   PREPARATION FOR PROCEDURE: The patient was brought to the operating room and placed on the operating table in supine position. A pneumatic ankle tourniquet was placed about the patient's Right foot but not yet inflated. After the department of anesthesia had administered MAC anesthesia. A local block was administered and The Right foot was then scrubbed, prepped, and draped in the usual aseptic manner. An Esmarch bandage was utilized to exsanguinate the patient's Right foot and leg, and the pneumatic tourniquet was inflated  to 250 mmHg.   PROCEDURE IN DETAIL: At this time, attention was directed to the patient's Right foot where there was of note prominent bone and a full thickness ulceration plantar aspect of the foot that measures 4 x2x1cm. Attention was directed to the posterior heel where a 15 blade was used to elliptically excise and debride the heel wound through subcutaneous tissue and fat down to the level of bone removing all nonviable tissue.  Once healthy bleeding and normal tissue was noted the wound was flushed with saline solution.  Intraop bone biopsy was obtained using Jamshidi needle and sent to pathology then Cytol allograft 7 x 10 and micromatrix powder was secured to wound bed using staples. A post op block consisting of 20 cc of 0.25% marcaine and 1% lidocaine plain was administered to surgical site. The Right foot was then dressed with surgical lube and adaptic overlying the graft site, 4 x 4 gauze, abd, Kerlix, and ACE. At this time, the Right pneumatic tourniquet was deflated, and a positive hyperemic response was noted Right digits. The patient tolerated the procedure and anesthesia well. Upon transfer to the recovery room, the patient's vital signs were stable, and neurovascular status was intact.   Patient to return to medical floor for continued medical management if bone biopsy is negative IV antibiotics can be discontinued however positive recommend long-term course of antibiotics for a total of 4 weeks of Invanz.  Landis Martins, DPM

## 2020-04-06 NOTE — Progress Notes (Signed)
Physical Therapy Hydrotherapy Wound Treatment    04/06/20 1200  Subjective Assessment  Subjective Pt sleeping most of session.  Date of Onset  (present on admission)  Evaluation and Treatment  Evaluation and Treatment Procedures Explained to Patient/Family Yes  Evaluation and Treatment Procedures agreed to  Pressure Injury 04/04/20 Sacrum Medial Unstageable - Full thickness tissue loss in which the base of the injury is covered by slough (yellow, tan, gray, green or brown) and/or eschar (tan, brown or black) in the wound bed. Physical therapy only-  sacral   Date First Assessed/Time First Assessed: 04/04/20 1030   Location: Sacrum  Location Orientation: Medial  Staging: Unstageable - Full thickness tissue loss in which the base of the injury is covered by slough (yellow, tan, gray, green or brown) and/or ...  Dressing Type Santyl;Moist to dry;Foam - Lift dressing to assess site every shift;Gauze (Comment)  Dressing Changed  Dressing Change Frequency Twice a day  State of Healing Eschar  Site / Wound Assessment Brown;Black;Bleeding;Yellow  % Wound base Red or Granulating 0%  % Wound base Yellow/Fibrinous Exudate 10%  % Wound base Black/Eschar 90%  Peri-wound Assessment Erythema (non-blanchable)  Margins Unattached edges (unapproximated)  Drainage Amount Moderate  Drainage Description Serosanguineous;Odor;Purulent  Treatment Debridement (Selective);Hydrotherapy (Pulse lavage);Packing (Saline gauze);Off loading  Hydrotherapy  Pulsed Lavage with Suction (psi) 12 psi  Pulsed Lavage with Suction - Normal Saline Used 1000 mL  Pulsed Lavage Tip Tip with splash shield  Pulsed lavage therapy - wound location sacral  Selective Debridement  Selective Debridement - Location sacral  Selective Debridement - Tools Used Forceps;Scissors  Selective Debridement - Tissue Removed moderate amount of black eschar, minimal slough  Wound Therapy - Assess/Plan/Recommendations  Wound Therapy - Clinical  Statement Patient comes from LTC SNF, is bed bound for several weeks per patient. Patient has right heel wound/osteo being followed by podiatry. Patient will benefit from PT for hydrotherapy to decrease necrotic and increase granulation, improve qualitiy of wound and minimize bioburden effects.  Wound Therapy - Functional Problem List bed bound, total care  Factors Delaying/Impairing Wound Healing Diabetes Mellitus;Incontinence  Hydrotherapy Plan Debridement;Dressing change;Patient/family education;Pulsatile lavage with suction  Wound Therapy - Frequency 6X / week  Wound Therapy - Current Recommendations Case manager/social work  Wound Therapy - Follow Up Recommendations Skilled nursing facility  Wound Plan Continue with PLS, conservative sharp debridement and dressing changes to decrease bioburden and necrotic tissue.  Wound Therapy Goals - Improve the function of patient's integumentary system by progressing the wound(s) through the phases of wound healing by:  Decrease Necrotic Tissue to 50  Decrease Necrotic Tissue - Progress Progressing toward goal  Increase Granulation Tissue to 50  Increase Granulation Tissue - Progress Progressing toward goal  Patient/Family will be able to  direct pressure relief from sacrum  Patient/Family Instruction Goal - Progress Progressing toward goal  Goals/treatment plan/discharge plan were made with and agreed upon by patient/family Yes  Time For Goal Achievement 2 weeks  Wound Therapy - Potential for Goals Fair   Time: 3149-7026  Arlyce Dice, DPT Acute Rehabilitation Services Pager: [phone] Office: [phone]

## 2020-04-06 NOTE — TOC Progression Note (Signed)
Transition of Care [facility]) - Progression Note    Patient Details  Name: MILLI WOOLRIDGE MRN: [id number] Date of Birth: [birth year]  [facility] CM/SW Contact  Ly Wass, Juliann Pulse, RN Phone Number: 04/06/2020, 11:56 AM  Clinical Narrative:Spoke to dtr Arturo Morton [phone]-she does not want her mom to return back to [facility] prefers [facility] for LT-I spoke to rep LaSha @ [facility]-she will review fl2,I have also faxed out to other facilities-await bed offers.       Expected Discharge Plan: Long Term Nursing Home Barriers to Discharge: Continued Medical Work up  Expected Discharge Plan and Services Expected Discharge Plan: [place]                                               Social Determinants of Health (SDOH) Interventions    Readmission Risk Interventions No flowsheet data found.

## 2020-04-06 NOTE — Progress Notes (Addendum)
PROGRESS NOTE   [name]  HEN:[id number] DOB: 02/22/1946 DOA: 04/03/2020 PCP: Hendricks Limes, MD  Brief Narrative:  75 year old female skilled living facility resident-wheelchair-bound at baseline critical right lower extremity ischemia status post angiography recanalization A NT tibial artery-balloon angioplasty on aspirin Plavix Aortic stenosis status post TAVR 03/01/2019 for, aortic valve repair 2006 HFpEF + CHB + PPM (placed 01/2019)-bacteremia group B strep 06/2019 status post PPM explantation and then subsequent Medtronic Micra AV Prior coronavirus infection 06/2018 DM TY 2 Jehovah's Witness CKD stage III Prior CVA Breast cancer status postmastectomy and chemotherapy  Presented from [facility] has been treated in the outpatient setting for osteomyelitis of the right great toe with doxycycline--on follow-up evaluation found to have acute kidney injury and was started on saline in the outpatient setting-found to have sacral decubitus and right heel ulcer Culture showed right heel ulcer was positive for Morganella and had been changed to cefdinir  Found to have mild hypotension white count elevated to 19.8 General surgery saw the patient for sacral ulcer Podiatry saw the patient in addition-patient was placed on Invanz CT scan was ordered  Assessment & Plan:   Active Problems:   Open wound of heel   1. Osteomyelitis of right heel-failed outpatient therapy on multiple agents a. CT scan 2/8 reveals first proximal distal phalangeal osteomyelitis?  Calcaneal osteomyelitis-per podiatry b. Continue Invanz till can obtain deep bone culture and may need 6 weeks antibiotics regardless c. Received bolus of IV fluids-changed to IV fluid '@75'  cc/H continue 2. Sacral decubitus ulcer a. General surgery recommends hydrotherapy as per PT-Sharp debridement performed by general surgery at bedside 2/10 b. Santyl as needed c. Wounds reviewed 2/10 see below pictures 3. HOCM +  bradycardia a. Metoprolol as below-holding hydralazine for now 4. AoS status post TAVR a. Plavix held-aspirin continued b. Metoprolol changed to XL form 25 daily to stop smoking c. 49 hypertension 5. AKI on admission superimposed on CKD 3B 6. Metabolic a. Saline 75 cc/H-avoid nephrotoxins b. Kidney function is slowly improving she has a mild metabolic acidosis in addition 7. Acute urinary retention a. Retained urine on 2/10 therefore Foley placed 2/11 b. Needs outpatient monitor 8. DM TY 2 with neuropathy a. Resume gabapentin 100 at bedtime, stop combination insulin liraglutide b. Sliding scale coverage blood sugars ranging continue to 120 9. Depression a. Continue Remeron 7.5 at bedtime, Cymbalta 30 daily 10. Normocytic anemia a. Monitor trends-transfusion threshold below 7  DVT prophylaxis: Lovenox Code Status: Full Family Communication: called [name] Glennon Mac [phone] no answer on several days of calling Called the daughter [name] [phone] and updated Disposition:  Status is: Inpatient  Remains inpatient appropriate because:Hemodynamically unstable, Persistent severe electrolyte disturbances, Ongoing active pain requiring inpatient pain management and IV treatments appropriate due to intensity of illness or inability to take PO  Dispo: The patient is from: SNF              Anticipated d/c is to: SNF              Anticipated d/c date is: 3 days needs surgery and then rehabilitation and probably return just rehab in the next week              Patient currently is not medically stable to d/c.   Difficult to place patient No   Consultants:   Podiatry  General surgery  Procedures: None yet  Antimicrobials: Invanz   Subjective:  Sitting comfortably No distress Foley catheter placed earlier today   Objective:  Vitals:   04/05/20 2053 04/06/20 0637 04/06/20 0923 04/06/20 1230  BP: 98/60 (!) 96/50 (!) 103/46 134/61  Pulse: (!) 59 60 63 62  Resp: '17  14 14 16  ' Temp: 97.8 F (36.6 C) (!) 97.3 F (36.3 C) (!) 97.4 F (36.3 C) (!) 97.5 F (36.4 C)  TempSrc:  Oral Axillary Axillary  SpO2: 98% 100% 97% 94%  Weight:      Height:        Intake/Output Summary (Last 24 hours) at 04/06/2020 1306 Last data filed at 04/06/2020 0700 Gross per 24 hour  Intake 1821.95 ml  Output -  Net 1821.95 ml   Filed Weights   04/05/20 0018  Weight: 62.1 kg    Examination:  EOMI NCAT no focal deficit Chest clear no added sound Abdomen soft slight distention no rebound Wounds not examined today   Data Reviewed: I have personally reviewed following labs and imaging studies   Baseline BUN/creatinine about 2.1-9 > 90/2.2-->82/2.2-->88/2.2 CO2 was 19 Sodium 131 Alk phos 129 White count 22.6--> 21.0  COVID-19 Labs  No results for input(s): DDIMER, FERRITIN, LDH, CRP in the last 72 hours.  Lab Results  Component Value Date   [place] NEGATIVE 04/03/2020   [place] NEGATIVE 02/08/2020   [place] NEGATIVE 01/04/2020   [place] NEGATIVE 07/17/2019     Radiology Studies: No results found.   Scheduled Meds: . (feeding supplement) PROSource Plus  30 mL Oral BID BM  . aspirin EC  81 mg Oral Daily  . atorvastatin  80 mg Oral QHS  . Chlorhexidine Gluconate Cloth  6 each Topical Daily  . collagenase   Topical Daily  . DULoxetine  30 mg Oral Daily  . feeding supplement (GLUCERNA SHAKE)  237 mL Oral Q24H  . gabapentin  100 mg Oral QHS  . heparin  5,000 Units Subcutaneous Q8H  . insulin aspart  0-15 Units Subcutaneous TID WC  . lactulose  20 g Oral Daily  . melatonin  6 mg Oral QPM  . metoprolol succinate  25 mg Oral Daily  . mirtazapine  7.5 mg Oral QHS  . multivitamin with minerals  1 tablet Oral Daily  . nutrition supplement (JUVEN)  1 packet Oral BID BM  . saccharomyces boulardii  250 mg Oral BID   Continuous Infusions: . sodium chloride 125 mL/hr at 04/06/20 1026  . ertapenem 500 mg (04/05/20 1225)     LOS: 3 days     Time spent: [place], MD [facility] To contact the attending provider between 7A-7P or the covering provider during after hours 7P-7A, please log into the web site [email or web address] and access using universal [place] password for that web site. If you do not have the password, please call the hospital operator.  04/06/2020, 1:06 PM

## 2020-04-06 NOTE — Progress Notes (Signed)
Notified provider on call due to patient not having any output during the night shift. Patient was bladder scanned twice. First time it showed 161 mL in the bladder and the second time it showed 192 mL in the bladder. On call provider ordered 500 mL NaCl 0.9% bolus.

## 2020-04-07 DIAGNOSIS — L89159 Pressure ulcer of sacral region, unspecified stage: Secondary | ICD-10-CM | POA: Diagnosis not present

## 2020-04-07 LAB — RETICULOCYTES
Immature Retic Fract: 36.6 % — ABNORMAL HIGH (ref 2.3–15.9)
RBC.: 2.3 MIL/uL — ABNORMAL LOW (ref 3.87–5.11)
Retic Count, Absolute: 48.5 10*3/uL (ref 19.0–186.0)
Retic Ct Pct: 2.1 % (ref 0.4–3.1)

## 2020-04-07 LAB — CBC WITH DIFFERENTIAL/PLATELET
Abs Immature Granulocytes: 0.22 10*3/uL — ABNORMAL HIGH (ref 0.00–0.07)
Basophils Absolute: 0 10*3/uL (ref 0.0–0.1)
Basophils Relative: 0 %
Eosinophils Absolute: 0.1 10*3/uL (ref 0.0–0.5)
Eosinophils Relative: 1 %
HCT: 22.3 % — ABNORMAL LOW (ref 36.0–46.0)
Hemoglobin: 7.2 g/dL — ABNORMAL LOW (ref 12.0–15.0)
Immature Granulocytes: 1 %
Lymphocytes Relative: 5 %
Lymphs Abs: 0.8 10*3/uL (ref 0.7–4.0)
MCH: 30.5 pg (ref 26.0–34.0)
MCHC: 32.3 g/dL (ref 30.0–36.0)
MCV: 94.5 fL (ref 80.0–100.0)
Monocytes Absolute: 1.1 10*3/uL — ABNORMAL HIGH (ref 0.1–1.0)
Monocytes Relative: 7 %
Neutro Abs: 14.3 10*3/uL — ABNORMAL HIGH (ref 1.7–7.7)
Neutrophils Relative %: 86 %
Platelets: 273 10*3/uL (ref 150–400)
RBC: 2.36 MIL/uL — ABNORMAL LOW (ref 3.87–5.11)
RDW: 17.1 % — ABNORMAL HIGH (ref 11.5–15.5)
WBC: 16.5 10*3/uL — ABNORMAL HIGH (ref 4.0–10.5)
nRBC: 0 % (ref 0.0–0.2)

## 2020-04-07 LAB — IRON AND TIBC: Iron: 13 ug/dL — ABNORMAL LOW (ref 28–170)

## 2020-04-07 LAB — COMPREHENSIVE METABOLIC PANEL
ALT: 16 U/L (ref 0–44)
AST: 17 U/L (ref 15–41)
Albumin: 1.3 g/dL — ABNORMAL LOW (ref 3.5–5.0)
Alkaline Phosphatase: 102 U/L (ref 38–126)
Anion gap: 11 (ref 5–15)
BUN: 80 mg/dL — ABNORMAL HIGH (ref 8–23)
CO2: 15 mmol/L — ABNORMAL LOW (ref 22–32)
Calcium: 6.9 mg/dL — ABNORMAL LOW (ref 8.9–10.3)
Chloride: 111 mmol/L (ref 98–111)
Creatinine, Ser: 2 mg/dL — ABNORMAL HIGH (ref 0.44–1.00)
GFR, Estimated: 25 mL/min — ABNORMAL LOW (ref >60)
Glucose, Bld: 68 mg/dL — ABNORMAL LOW (ref 70–99)
Potassium: 3.6 mmol/L (ref 3.5–5.1)
Sodium: 137 mmol/L (ref 135–145)
Total Bilirubin: 0.8 mg/dL (ref 0.3–1.2)
Total Protein: 4.4 g/dL — ABNORMAL LOW (ref 6.5–8.1)

## 2020-04-07 LAB — GLUCOSE, CAPILLARY
Glucose-Capillary: 75 mg/dL (ref 70–99)
Glucose-Capillary: 94 mg/dL (ref 70–99)
Glucose-Capillary: 111 mg/dL — ABNORMAL HIGH (ref 70–99)
Glucose-Capillary: 125 mg/dL — ABNORMAL HIGH (ref 70–99)

## 2020-04-07 MED ORDER — SODIUM CHLORIDE 0.9 % IV BOLUS
500.0000 mL | Freq: Once | INTRAVENOUS | Status: AC
  Administered 2020-04-07: 500 mL via INTRAVENOUS

## 2020-04-07 NOTE — Progress Notes (Signed)
PROGRESS NOTE   [name]  CHE:[id number] DOB: 1946/02/08 DOA: 04/03/2020 PCP: Hendricks Limes, MD  Brief Narrative:  75 year old female skilled living facility resident-wheelchair-bound at baseline Jehovah's Witness-does not take transfusions critical right lower extremity ischemia status post angiography recanalization A NT tibial artery-balloon angioplasty on aspirin Plavix Aortic stenosis status post TAVR 03/01/2019 for, aortic valve repair 2006 HFpEF + CHB + PPM (placed 01/2019)-bacteremia group B strep 06/2019 status post PPM explantation and then subsequent [employer] Micra AV Prior coronavirus infection 06/2018 DM TY 2 CKD stage III Prior CVA Breast cancer status postmastectomy and chemotherapy  Presented from [facility] has been treated in the outpatient setting for osteomyelitis of the right great toe with doxycycline--on follow-up evaluation found to have acute kidney injury and was started on saline in the outpatient setting-found to have sacral decubitus and right heel ulcer Culture showed right heel ulcer was positive for Morganella and had been changed to cefdinir  Found to have mild hypotension white count elevated to 19.8 General surgery saw the patient for sacral ulcer Podiatry saw the patient in addition-patient was placed on Invanz CT scan was ordered  Assessment & Plan:   Active Problems:   Open wound of heel   1. Osteomyelitis of right heel-failed outpatient therapy on multiple agents a. CT scan 2/8 reveals first proximal distal phalangeal osteomyelitis underwent 2/11 debridement b. White count has responded well c. Continue Invanz till deep wound culture results d. Continue fluids 125 cc/H 2. Perioperative hypotension a. Had a little hypothermia and hypotension 2/11 2/12 b. Give saline bolus 500 cc c. Adjust blood pressure meds as below  3. Sacral decubitus ulcer a. General surgery recommends hydrotherapy as per PT-Sharp debridement performed by general  surgery at bedside 2/10 b. Santyl as needed c. To be reviewed next 2/13 a.m. 4. HOCM + bradycardia a. Metoprolol and hydralazine both on hold 5. AoS status post TAVR a. Plavix held-aspirin continued b. Toprol-XL 25 held given perioperative hypotension c. Outpatient May need to resume 6. AKI on admission superimposed on CKD 3B 7. Metabolic a. Kidney function is slowly improving she has a mild metabolic acidosis in addition 8. Acute urinary retention a. Retained urine on 2/10 therefore Foley placed 2/11 b. Needs outpatient monitor c. Will attempt voiding trial on 2/13 a.m. to see if this can be removed prior to discharge otherwise will need to keep the same and this may help healing of her sacral decubitus 9. DM TY 2 with neuropathy a. Resume gabapentin 100 at bedtime, stop combination insulin liraglutide b. Sliding scale coverage discontinued given sugars are low c. Resume meds in the outpatient setting 10. Depression a. Continue Remeron 7.5 at bedtime, Cymbalta 30 daily 11. Normocytic anemia-Jehovah's Witness a. Monitor trends-transfusion threshold below 7 but patient is a Jehovah's Witness b. Check iron stores and may need to give Feraheme  DVT prophylaxis: Lovenox Code Status: Full Family Communication: Updated at the bedside [name] [phone] on 2/12 Disposition:  Status is: Inpatient  Remains inpatient appropriate because:Hemodynamically unstable, Persistent severe electrolyte disturbances, Ongoing active pain requiring inpatient pain management and IV treatments appropriate due to intensity of illness or inability to take PO  Dispo: The patient is from: SNF              Anticipated d/c is to: SNF              Anticipated d/c date is: 3 days needs surgery and then rehabilitation and probably return just rehab in the next week  Patient currently is not medically stable to d/c.   Difficult to place patient No   Consultants:   Podiatry  General  surgery  Procedures: None yet  Antimicrobials: Invanz   Subjective:  Awake coherent no distress slight confusion about when she had the surgery No pain No fever   Objective: Vitals:   04/06/20 2100 04/07/20 0000 04/07/20 0600 04/07/20 0800  BP: (!) 110/54 (!) 95/51  (!) 97/38  Pulse: 62 62  68  Resp: '14 16  14  ' Temp: (!) 97 F (36.1 C) 98.5 F (36.9 C) 98.8 F (37.1 C)   TempSrc:   Oral   SpO2: 100% 100%  100%  Weight:      Height:        Intake/Output Summary (Last 24 hours) at 04/07/2020 0854 Last data filed at 04/06/2020 2300 Gross per 24 hour  Intake 1459.76 ml  Output 500 ml  Net 959.76 ml   Filed Weights   04/05/20 0018 04/06/20 1644  Weight: 62.1 kg 62.1 kg    Examination:  EOMI NCAT no focal deficit CTA B no added sound Abdomen soft nontender no rebound no guarding Right lower extremity is wrapped in [place] with protective boot I did not examine the wound today Neurologically intact   Data Reviewed: I have personally reviewed following labs and imaging studies   Baseline BUN/creatinine about 2.1-9 > 90/2.2-->82/2.2-->88/2.2-->80/2.0 CO2 was 15 Sodium 137 Alk phos 102 White count 22.6--> 21.0-->16.5 Hemoglobin 9.7-->7.2  COVID-19 Labs  No results for input(s): DDIMER, FERRITIN, LDH, CRP in the last 72 hours.  Lab Results  Component Value Date   [place] NEGATIVE 04/03/2020   [place] NEGATIVE 02/08/2020   [place] NEGATIVE 01/04/2020   [place] NEGATIVE 07/17/2019     Radiology Studies: No results found.   Scheduled Meds: . (feeding supplement) PROSource Plus  30 mL Oral BID BM  . aspirin EC  81 mg Oral Daily  . atorvastatin  80 mg Oral QHS  . Chlorhexidine Gluconate Cloth  6 each Topical Daily  . collagenase   Topical Daily  . DULoxetine  30 mg Oral Daily  . feeding supplement (GLUCERNA SHAKE)  237 mL Oral Q24H  . gabapentin  100 mg Oral QHS  . heparin  5,000 Units Subcutaneous Q8H  . lactulose  20 g Oral Daily   . melatonin  6 mg Oral QPM  . multivitamin with minerals  1 tablet Oral Daily  . nutrition supplement (JUVEN)  1 packet Oral BID BM  . saccharomyces boulardii  250 mg Oral BID   Continuous Infusions: . sodium chloride 125 mL/hr at 04/07/20 0417  . ertapenem 500 mg (04/06/20 1316)     LOS: 4 days    Time spent: [place], MD [facility] To contact the attending provider between 7A-7P or the covering provider during after hours 7P-7A, please log into the web site [email or web address] and access using universal [place] password for that web site. If you do not have the password, please call the hospital operator.  04/07/2020, 8:54 AM

## 2020-04-07 NOTE — Transfer of Care (Signed)
Immediate Anesthesia Transfer of Care Note  Patient: [name]  Procedure(s) Performed: IRRIGATION AND DEBRIDEMENT WOUND (Right ) BONE BIOPSY (Right )  Patient Location: PACU  Anesthesia Type:MAC  Level of Consciousness: sedated  Airway & Oxygen Therapy: Patient Spontanous Breathing and Patient connected to nasal cannula oxygen  Post-op Assessment: Report given to RN and Post -op Vital signs reviewed and stable  Post vital signs: Reviewed and stable  Last Vitals:  Vitals Value Taken Time  BP 95/51 04/07/20 0000  Temp 36.9 C 04/07/20 0000  Pulse 63 04/07/20 0221  Resp 12 04/07/20 0221  SpO2 100 % 04/07/20 0221  Vitals shown include unvalidated device data.  Last Pain:  Vitals:   04/06/20 2300  TempSrc:   PainSc: 0-No pain      Patients Stated Pain Goal: 2 (25/18/98 4210)  Complications: No complications documented.

## 2020-04-07 NOTE — Progress Notes (Signed)
Subjective: [name] is a 75 y.o. female patient seen at bedside, resting comfortably in no acute distress s/p day #1 right heel wound debridement and bone biopsy with application of graft. Patient denies pain at surgical site.  No other acute issues noted.   Daughter named Diane at bedside.  Patient Active Problem List   Diagnosis Date Noted  . Open wound of heel 04/03/2020  . Osteomyelitis of toe ([place]) 02/22/2020  . Bleeding 01/27/2020  . CKD (chronic kidney disease) stage 3, GFR 30-59 ml/min (HCC) 01/27/2020  . Critical lower limb ischemia ([place]) 01/04/2020  . Skin pustule 10/20/2019  . Neurocognitive deficits 08/04/2019  . Controlled type 2 diabetes mellitus without complication ([place]) 40/98/1191  . CVA (cerebral vascular accident) ([place]) 07/23/2019  . Infection of pacemaker lead wire ([place]) 07/21/2019  . Fever   . History of transcatheter aortic valve replacement (TAVR)   . Septic encephalopathy 07/18/2019  . Sacral decubitus ulcer, stage II ([place]) 07/18/2019  . Bacteremia due to group B Streptococcus 07/18/2019  . Sepsis ([place]) 07/18/2019  . Complete heart block ([place])   . Chronic diastolic congestive heart failure ([place]) 02/07/2019  . AKI (acute kidney injury) ([place]) 07/22/2017  . Peripheral arterial disease ([place]) 06/18/2017  . B12 deficiency 05/01/2017  . Bilateral lower extremity edema 11/08/2015  . Aortic atherosclerosis ([place]) 06/07/2014  . Abnormality of gait 05/29/2012  . Constipation 03/16/2012  . HOCM (hypertrophic obstructive cardiomyopathy) ([place]) 06/11/2011  . Routine health maintenance 06/10/2010  . Hyperlipidemia   . Refusal of blood transfusions as patient is Jehovah's Witness   . History of adenocarcinoma of breast   . Osteoporosis   . Status post CVA   . Aortic stenosis s/p Tissure AVR 2006   . CAD (coronary artery disease)   . Hypertension   . Depression 01/23/2006  . DM (diabetes mellitus) ([place]) 03/25/1990  . Benign hypertensive heart and kidney  disease with diastolic CHF, NYHA class 3 and CKD stage 3 ([place]) 1992  . Bradycardia 1992     Current Facility-Administered Medications:  .  (feeding supplement) PROSource Plus liquid 30 mL, 30 mL, Oral, BID BM, Samtani, Jai-Gurmukh, MD, 30 mL at 04/07/20 1054 .  0.9 %  sodium chloride infusion, , Intravenous, Continuous, Samtani, Jai-Gurmukh, MD, Last Rate: 125 mL/hr at 04/07/20 0417, New Bag at 04/07/20 0417 .  acetaminophen (TYLENOL) tablet 650 mg, 650 mg, Oral, Q6H PRN, 650 mg at 04/06/20 1035 **OR** acetaminophen (TYLENOL) suppository 650 mg, 650 mg, Rectal, Q6H PRN, Marylyn Ishihara, Tyrone A, DO .  aspirin EC tablet 81 mg, 81 mg, Oral, Daily, Kyle, Tyrone A, DO, 81 mg at 04/07/20 1048 .  atorvastatin (LIPITOR) tablet 80 mg, 80 mg, Oral, QHS, Kyle, Tyrone A, DO, 80 mg at 04/06/20 2249 .  Chlorhexidine Gluconate Cloth 2 % PADS 6 each, 6 each, Topical, Daily, Nita Sells, MD, 6 each at 04/07/20 1055 .  collagenase (SANTYL) ointment, , Topical, Daily, Kyle, Tyrone A, DO, Given at 04/07/20 1055 .  DULoxetine (CYMBALTA) DR capsule 30 mg, 30 mg, Oral, Daily, Kyle, Tyrone A, DO, 30 mg at 04/07/20 1048 .  ertapenem (INVANZ) 500 mg in sodium chloride 0.9 % 50 mL IVPB, 500 mg, Intravenous, Q24H, Samtani, Jai-Gurmukh, MD, Last Rate: 100 mL/hr at 04/07/20 1334, 500 mg at 04/07/20 1334 .  feeding supplement (GLUCERNA SHAKE) (GLUCERNA SHAKE) liquid 237 mL, 237 mL, Oral, Q24H, Samtani, Jai-Gurmukh, MD, 237 mL at 04/07/20 1056 .  gabapentin (NEURONTIN) capsule 100 mg, 100 mg, Oral, QHS, Marylyn Ishihara, Tyrone  A, DO, 100 mg at 04/06/20 2248 .  heparin injection 5,000 Units, 5,000 Units, Subcutaneous, Q8H, Kyle, Tyrone A, DO, 5,000 Units at 04/07/20 1442 .  HYDROcodone-acetaminophen (NORCO/VICODIN) 5-325 MG per tablet 1-2 tablet, 1-2 tablet, Oral, Q4H PRN, Ronaldo Miyamoto, Tyrone A, DO, 2 tablet at 04/05/20 0017 .  lactulose (CHRONULAC) 10 GM/15ML solution 20 g, 20 g, Oral, Daily, Kyle, Tyrone A, DO, 20 g at 04/06/20 1036 .   melatonin tablet 6 mg, 6 mg, Oral, QPM, Kyle, Tyrone A, DO, 6 mg at 04/05/20 1906 .  multivitamin with minerals tablet 1 tablet, 1 tablet, Oral, Daily, Kyle, Tyrone A, DO, 1 tablet at 04/07/20 1048 .  nutrition supplement (JUVEN) (JUVEN) powder packet 1 packet, 1 packet, Oral, BID BM, Rhetta Mura, MD, 1 packet at 04/07/20 1055 .  ondansetron (ZOFRAN) tablet 4 mg, 4 mg, Oral, Q12H PRN, Ronaldo Miyamoto, Tyrone A, DO .  saccharomyces boulardii (FLORASTOR) capsule 250 mg, 250 mg, Oral, BID, Kyle, Tyrone A, DO, 250 mg at 04/07/20 1048  Allergies  Allergen Reactions  . Doxycycline     Made her stopped eating   . 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," as the patient is a Jehovah's Witness     Objective: Today's Vitals   04/07/20 0800 04/07/20 1039 04/07/20 1100 04/07/20 1600  BP: (!) 97/38 (!) 108/48 (!) 105/45   Pulse: 68  62   Resp: 14  11   Temp:    98 F (36.7 C)  TempSrc:    Axillary  SpO2: 100%  100%   Weight:      Height:      PainSc:        General: No acute distress  Right lower extremity: Dressing to right foot clean, dry, intact. No strikethrough noted, capillary fill time 5 seconds at exposed toes. No other acute signs of infection. No calf pain.  Heel offloading boot in place..      Results for orders placed or performed during the hospital encounter of 04/03/20  Culture, blood (routine x 2)     Status: None (Preliminary result)   Collection Time: 04/03/20  2:45 PM   Specimen: BLOOD  Result Value Ref Range Status   Specimen Description   Final    BLOOD RIGHT ARM Performed at [facility], [address] [place], [place] [address]    Special Requests   Final    BOTTLES DRAWN AEROBIC AND ANAEROBIC Blood Culture adequate volume Performed at [facility], [address], [place], [place] [address]    Culture   Final    NO GROWTH 3 DAYS Performed at [facility], [address], [place], [place] [address]     Report Status PENDING  Incomplete  Resp Panel by RT-PCR (Flu A&B, Covid) Nasopharyngeal Swab     Status: None   Collection Time: 04/03/20  2:46 PM   Specimen: Nasopharyngeal Swab; Nasopharyngeal(NP) swabs in vial transport medium  Result Value Ref Range Status   SARS Coronavirus 2 by RT PCR NEGATIVE NEGATIVE Final    Comment: (NOTE) SARS-CoV-2 target nucleic acids are NOT DETECTED.  The SARS-CoV-2 RNA is generally detectable in upper respiratory specimens during the acute phase of infection. The lowest concentration of SARS-CoV-2 viral copies this assay can detect is 138 copies/mL. A negative result does not preclude SARS-Cov-2 infection and should not be used as the sole basis for treatment or other patient management decisions. A negative result may occur with  improper specimen collection/handling, submission of specimen other than nasopharyngeal swab, presence of viral mutation(s) within the areas targeted by this assay, and inadequate number of viral copies(<138 copies/mL). A negative result must be combined with clinical observations, patient history, and epidemiological information. The expected result is Negative.  Fact Sheet for Patients:  EntrepreneurPulse.com.au  Fact Sheet for Healthcare Providers:  IncredibleEmployment.be  This test is no t yet approved or cleared by the [place] FDA and  has been authorized for detection and/or diagnosis of SARS-CoV-2 by FDA under an Emergency Use Authorization (EUA). This EUA will remain  in effect (meaning this test can be used) for the duration of the COVID-19 declaration under Section 564(b)(1) of the Act, 21 U.S.C.section 360bbb-3(b)(1), unless the authorization is terminated  or revoked sooner.       Influenza A by PCR NEGATIVE NEGATIVE Final   Influenza B by PCR NEGATIVE NEGATIVE Final    Comment: (NOTE) The Xpert Xpress SARS-CoV-2/FLU/RSV plus assay is intended as an aid in the  diagnosis of influenza from Nasopharyngeal swab specimens and should not be used as a sole basis for treatment. Nasal washings and aspirates are unacceptable for Xpert Xpress SARS-CoV-2/FLU/RSV testing.  Fact Sheet for Patients: EntrepreneurPulse.com.au  Fact Sheet for Healthcare Providers: IncredibleEmployment.be  This test is not yet approved or cleared by the [place] FDA and has been authorized for detection and/or diagnosis of SARS-CoV-2 by FDA under an Emergency Use Authorization (EUA). This EUA will remain in effect (meaning this test can be used) for the duration of the COVID-19 declaration under Section 564(b)(1) of the Act, 21 U.S.C. section 360bbb-3(b)(1), unless the authorization is terminated or revoked.  Performed at [facility], [place] [address], [place], [place] [address]   MRSA PCR Screening     Status: None   Collection Time: 04/06/20  3:30 PM   Specimen: Nasal Mucosa; Nasopharyngeal  Result Value Ref Range Status   MRSA by PCR NEGATIVE NEGATIVE Final    Comment:        The GeneXpert MRSA Assay (FDA approved for NASAL specimens only), is one component of a comprehensive MRSA colonization surveillance program. It is not intended to diagnose MRSA infection nor to guide or monitor treatment for MRSA infections. Performed at [facility], [place] [address], [place], [place] [address]    Pathology pending  Assessment and Plan:  Problem List Items Addressed This Visit   None   Visit Diagnoses    Pressure injury of skin of sacral region, unspecified injury stage    -  Primary   Wound infection       Relevant Medications   cefdinir (OMNICEF) 300 MG capsule   cefTRIAXone (ROCEPHIN) 1 g injection   ertapenem (INVANZ) 500 mg in sodium chloride 0.9 % 50 mL IVPB   Pressure injury of skin of right heel, unspecified injury stage       Toe osteomyelitis (HCC)       Relevant Medications    cefdinir (OMNICEF) 300 MG capsule   cefTRIAXone (ROCEPHIN) 1 g injection   ertapenem (INVANZ) 500 mg in sodium chloride 0.9 % 50 mL IVPB     -Patient seen and evaluated at bedside -Chart reviewed -Dressing check performed -Awaiting intra-op bone biopsy results; continue with IV antibiotics until cultures have resulted  -Continue with heel offloading -Continue with rest and elevation to assist with pain and edema control  -Patient may be transferred from bed to bedside chair and turned routinely according to nursing  protocol -Podiatry will continue to follow closely  -Anticipated discharge plan of care: To SNF with antibiotics for 2 to 4 weeks based on culture results. Patient after discharge to follow up in office and with wound care center within 1-2 weeks.    Landis Martins, DPM

## 2020-04-07 NOTE — Anesthesia Postprocedure Evaluation (Signed)
Anesthesia Post Note  Patient: [name]  Procedure(s) Performed: IRRIGATION AND DEBRIDEMENT WOUND (Right ) BONE BIOPSY (Right )     Patient location during evaluation: PACU Anesthesia Type: MAC Level of consciousness: awake and alert Pain management: pain level controlled Vital Signs Assessment: post-procedure vital signs reviewed and stable Respiratory status: spontaneous breathing, nonlabored ventilation, respiratory function stable and patient connected to nasal cannula oxygen Cardiovascular status: stable and blood pressure returned to baseline Postop Assessment: no apparent nausea or vomiting Anesthetic complications: no   No complications documented.  Last Vitals:  Vitals:   04/06/20 2100 04/07/20 0000  BP: (!) 110/54 (!) 95/51  Pulse: 62 62  Resp: 14 16  Temp: (!) 36.1 C 36.9 C  SpO2: 100% 100%    Last Pain:  Vitals:   04/06/20 2300  TempSrc:   PainSc: 0-No pain                 Catalina Gravel

## 2020-04-07 NOTE — Progress Notes (Signed)
Physical Therapy Hydrotherapy Wound Treatment    04/07/20   Subjective Assessment  Subjective Pt sleeping most of session.  Date of Onset  (present on admission)  Evaluation and Treatment  Evaluation and Treatment Procedures Explained to Patient/Family Yes  Evaluation and Treatment Procedures agreed to  Pressure Injury 04/04/20 Sacrum Medial Unstageable - Full thickness tissue loss in which the base of the injury is covered by slough (yellow, tan, gray, green or brown) and/or eschar (tan, brown or black) in the wound bed. Physical therapy only-  sacral   Date First Assessed/Time First Assessed: 04/04/20 1030   Location: Sacrum  Location Orientation: Medial  Staging: Unstageable - Full thickness tissue loss in which the base of the injury is covered by slough (yellow, tan, gray, green or brown) and/or ...  Dressing Type Santyl;Moist to dry;ABD  Dressing Changed  Dressing Change Frequency Twice a day  State of Healing Eschar  Site / Wound Assessment Brown;Black;Bleeding;Yellow  % Wound base Red or Granulating 0%  % Wound base Yellow/Fibrinous Exudate 10%  % Wound base Black/Eschar 90%  Peri-wound Assessment Erythema (non-blanchable)  Margins Unattached edges (unapproximated)  Drainage Amount Moderate  Drainage Description Serosanguineous;Odor;Purulent  Treatment Debridement (Selective);Hydrotherapy (Pulse lavage);Packing (Saline gauze);Off loading  Hydrotherapy  Pulsed Lavage with Suction (psi) 12 psi  Pulsed Lavage with Suction - Normal Saline Used 1000 mL  Pulsed Lavage Tip Tip with splash shield  Pulsed lavage therapy - wound location sacral  Selective Debridement  Selective Debridement - Location sacral  Selective Debridement - Tools Used Forceps;Scissors  Selective Debridement - Tissue Removed moderate amount of black eschar from bed and wound edges,  slough  Wound Therapy - Assess/Plan/Recommendations  Wound Therapy - Clinical Statement Patient comes from LTC SNF, is bed  bound for several weeks per patient. Patient has right heel wound/osteo being followed by podiatry. Patient will benefit from PT for hydrotherapy to decrease necrotic and increase granulation, improve qualitiy of wound and minimize bioburden effects. Patient is very somnolent. Complained of neck pain when rolling. Assisted  With A/AAROM to neck with subjective less pain. RN notified that Low air loss mattress replacement that was order 04/05/20 not in place. Also recommend Prevalon boots bilaterally. RN will order both.   Wound Therapy - Functional Problem List bed bound, total care  Factors Delaying/Impairing Wound Healing Diabetes Mellitus;Incontinence  Hydrotherapy Plan Debridement;Dressing change;Patient/family education;Pulsatile lavage with suction  Wound Therapy - Frequency 6X / week  Wound Therapy - Current Recommendations Case manager/social work  Wound Therapy - Follow Up Recommendations Skilled nursing facility  Wound Plan Continue with PLS, conservative sharp debridement and dressing changes to decrease bioburden and necrotic tissue.  Wound Therapy Goals - Improve the function of patient's integumentary system by progressing the wound(s) through the phases of wound healing by:  Decrease Necrotic Tissue to 50  Decrease Necrotic Tissue - Progress Progressing toward goal  Increase Granulation Tissue to 50  Increase Granulation Tissue - Progress Progressing toward goal  Patient/Family will be able to  direct pressure relief from sacrum  Patient/Family Instruction Goal - Progress Progressing toward goal  Goals/treatment plan/discharge plan were made with and agreed upon by patient/family Yes  Time For Goal Achievement 2 weeks  Wound Therapy - Potential for Goals Fair   Time: [place] Pager [phone] Office [phone]

## 2020-04-08 DIAGNOSIS — L89159 Pressure ulcer of sacral region, unspecified stage: Secondary | ICD-10-CM | POA: Diagnosis not present

## 2020-04-08 LAB — GLUCOSE, CAPILLARY
Glucose-Capillary: 79 mg/dL (ref 70–99)
Glucose-Capillary: 87 mg/dL (ref 70–99)
Glucose-Capillary: 132 mg/dL — ABNORMAL HIGH (ref 70–99)
Glucose-Capillary: 176 mg/dL — ABNORMAL HIGH (ref 70–99)
Glucose-Capillary: 147 mg/dL — ABNORMAL HIGH (ref 70–99)

## 2020-04-08 LAB — CULTURE, BLOOD (ROUTINE X 2)
Culture: NO GROWTH
Special Requests: ADEQUATE

## 2020-04-08 LAB — CBC WITH DIFFERENTIAL/PLATELET
Abs Immature Granulocytes: 0.29 10*3/uL — ABNORMAL HIGH (ref 0.00–0.07)
Basophils Absolute: 0 10*3/uL (ref 0.0–0.1)
Basophils Relative: 0 %
Eosinophils Absolute: 0.2 10*3/uL (ref 0.0–0.5)
Eosinophils Relative: 2 %
HCT: 20.2 % — ABNORMAL LOW (ref 36.0–46.0)
Hemoglobin: 6.5 g/dL — CL (ref 12.0–15.0)
Immature Granulocytes: 2 %
Lymphocytes Relative: 13 %
Lymphs Abs: 1.8 10*3/uL (ref 0.7–4.0)
MCH: 31.4 pg (ref 26.0–34.0)
MCHC: 32.2 g/dL (ref 30.0–36.0)
MCV: 97.6 fL (ref 80.0–100.0)
Monocytes Absolute: 1.2 10*3/uL — ABNORMAL HIGH (ref 0.1–1.0)
Monocytes Relative: 8 %
Neutro Abs: 10.6 10*3/uL — ABNORMAL HIGH (ref 1.7–7.7)
Neutrophils Relative %: 75 %
Platelets: 263 10*3/uL (ref 150–400)
RBC: 2.07 MIL/uL — ABNORMAL LOW (ref 3.87–5.11)
RDW: 17.6 % — ABNORMAL HIGH (ref 11.5–15.5)
WBC: 14.1 10*3/uL — ABNORMAL HIGH (ref 4.0–10.5)
nRBC: 0.1 % (ref 0.0–0.2)

## 2020-04-08 LAB — BASIC METABOLIC PANEL
Anion gap: 8 (ref 5–15)
BUN: 71 mg/dL — ABNORMAL HIGH (ref 8–23)
CO2: 14 mmol/L — ABNORMAL LOW (ref 22–32)
Calcium: 6.2 mg/dL — CL (ref 8.9–10.3)
Chloride: 116 mmol/L — ABNORMAL HIGH (ref 98–111)
Creatinine, Ser: 1.48 mg/dL — ABNORMAL HIGH (ref 0.44–1.00)
GFR, Estimated: 36 mL/min — ABNORMAL LOW (ref >60)
Glucose, Bld: 85 mg/dL (ref 70–99)
Potassium: 3 mmol/L — ABNORMAL LOW (ref 3.5–5.1)
Sodium: 138 mmol/L (ref 135–145)

## 2020-04-08 MED ORDER — DARBEPOETIN ALFA 40 MCG/0.4ML IJ SOSY
40.0000 ug | PREFILLED_SYRINGE | INTRAMUSCULAR | Status: DC
  Administered 2020-04-08: 40 ug via SUBCUTANEOUS
  Filled 2020-04-08: qty 0.4

## 2020-04-08 MED ORDER — SODIUM CHLORIDE 0.9 % IV SOLN
510.0000 mg | INTRAVENOUS | Status: DC
  Administered 2020-04-08: 510 mg via INTRAVENOUS
  Filled 2020-04-08: qty 510

## 2020-04-08 MED ORDER — CALCIUM CARBONATE 1250 (500 CA) MG PO TABS
1.0000 | ORAL_TABLET | Freq: Two times a day (BID) | ORAL | Status: DC
  Administered 2020-04-08 – 2020-04-11 (×7): 500 mg via ORAL
  Filled 2020-04-08 (×8): qty 1

## 2020-04-08 MED ORDER — POTASSIUM CHLORIDE CRYS ER 20 MEQ PO TBCR
40.0000 meq | EXTENDED_RELEASE_TABLET | Freq: Every day | ORAL | Status: DC
  Administered 2020-04-08 – 2020-04-10 (×3): 40 meq via ORAL
  Filled 2020-04-08 (×4): qty 2

## 2020-04-08 MED ORDER — HYDROCODONE-ACETAMINOPHEN 5-325 MG PO TABS
1.0000 | ORAL_TABLET | ORAL | Status: DC | PRN
  Administered 2020-04-08: 1 via ORAL
  Filled 2020-04-08: qty 1

## 2020-04-08 MED ORDER — DULOXETINE HCL 20 MG PO CPEP
20.0000 mg | ORAL_CAPSULE | Freq: Every day | ORAL | Status: DC
  Filled 2020-04-08: qty 1

## 2020-04-08 NOTE — Plan of Care (Signed)
  Problem: Coping: Goal: Level of anxiety will decrease Outcome: Progressing   Problem: Elimination: Goal: Will not experience complications related to urinary retention Outcome: Progressing   Problem: Pain Managment: Goal: General experience of comfort will improve Outcome: Progressing   Problem: Safety: Goal: Ability to remain free from injury will improve Outcome: Progressing   Problem: Skin Integrity: Goal: Risk for impaired skin integrity will decrease Outcome: Progressing   Problem: Coping: Goal: Ability to adjust to condition or change in health will improve Outcome: Progressing   Problem: Skin Integrity: Goal: Risk for impaired skin integrity will decrease Outcome: Progressing

## 2020-04-08 NOTE — Progress Notes (Signed)
PROGRESS NOTE   [name]  JQB:[id number] DOB: Nov 06, 1945 DOA: 04/03/2020 PCP: Hendricks Limes, MD  Brief Narrative:  75 year old female skilled living facility resident-wheelchair-bound at baseline Jehovah's Witness-does not take transfusions critical right lower extremity ischemia status post angiography recanalization A NT tibial artery-balloon angioplasty on aspirin Plavix Aortic stenosis status post TAVR 03/01/2019 for, aortic valve repair 2006 HFpEF + CHB + PPM (placed 01/2019)-bacteremia group B strep 06/2019 status post PPM explantation and then subsequent [employer] Micra AV Prior coronavirus infection 06/2018 DM TY 2 CKD stage III Prior CVA Breast cancer status postmastectomy and chemotherapy  Presented from [facility] has been treated in the outpatient setting for osteomyelitis of the right great toe with doxycycline--on follow-up evaluation found to have acute kidney injury and was started on saline in the outpatient setting-found to have sacral decubitus and right heel ulcer Culture showed right heel ulcer was positive for Morganella and had been changed to cefdinir  Found to have mild hypotension white count elevated to 19.8 General surgery saw the patient for sacral ulcer and did bedside debridement during hospital stay Podiatry saw the patient in addition-patient was placed on Invanz CT scan was ordered Eventually underwent debridement first proximal phalanx-became slightly hypotensive but has stabilized  Assessment & Plan:   Active Problems:   Open wound of heel   1. Osteomyelitis of right heel-failed outpatient therapy on multiple agents a. CT scan 2/8 reveals first proximal distal phalangeal osteomyelitis underwent 2/11 debridement b. White count continues to decrease c. Cut back fluids to 75 cc an hour 2. Perioperative hypotension a. Had a little hypothermia and hypotension 2/11 2/12 b. Low normal blood pressures monitor trends as above 3. Sacral decubitus  ulcer a. General surgery recommends hydrotherapy as per PT-Sharp debridement performed by general surgery at bedside 2/10 b. Santyl as needed 4. Normocytic anemia-Jehovah's Witness a. Hemoglobin 6.7 may not be able to resume Plavix this admission and may only need aspirin b. Iron levels are low-give Feraheme now and later on in the day-we will discuss erythropoietin use with family 5. HOCM + bradycardia a. Metoprolol and hydralazine both on hold 6. AoS status post TAVR a. Plavix held-aspirin continued b. Toprol-XL 25 held given perioperative hypotension c. Outpatient May need to resume 7. AKI on admission superimposed on CKD 3B 8. Metabolic a. Kidney function is slowly improving she has a mild metabolic acidosis in addition 9. Hypokalemia hypocalcemia a. Replace potassium with K. Dur 40 daily and start calcium carbonate 500 twice daily b. Corrected calcium is close to normal 10. Acute urinary retention a. Retained urine on 2/10 therefore Foley placed 2/11 b. Needs outpatient monitor c. Will attempt voiding trial on 2/13 a.m 11. DM TY 2 with neuropathy a. Resume gabapentin 100 at bedtime, stop combination insulin liraglutide b. Sliding scale coverage discontinued as mild hypoglycemia and not eating much 12. Depression a. Continue Remeron 7.5 at bedtime, Cymbalta 30 daily  DVT prophylaxis: Lovenox Code Status: Full Family Communication: Updated at the bedside [name] [phone] on 2/12 Disposition:  Status is: Inpatient  Remains inpatient appropriate because:Hemodynamically unstable, Persistent severe electrolyte disturbances, Ongoing active pain requiring inpatient pain management and IV treatments appropriate due to intensity of illness or inability to take PO  Dispo: The patient is from: SNF              Anticipated d/c is to: SNF              Anticipated d/c date is: 3 days needs surgery and  then rehabilitation and probably return just rehab in the next week               Patient currently is not medically stable to d/c.   Difficult to place patient No   Consultants:   Podiatry  General surgery  Procedures: None yet  Antimicrobials: Invanz   Subjective:  Doing fair no chest pain no fever no chills no nausea no vomiting Not eating much Oxygen seems to been placed overnight but she is 99% without it Still has Foley Not very mobile   Objective: Vitals:   04/08/20 0300 04/08/20 0400 04/08/20 0500 04/08/20 0523  BP: (!) 105/52 (!) 105/45 (!) 111/92 (!) 105/52  Pulse: 61  61 60  Resp:  $Remo'11 12 13  'BFjUs$ Temp:    97.6 F (36.4 C)  TempSrc:    Axillary  SpO2: 100%   100%  Weight:      Height:        Intake/Output Summary (Last 24 hours) at 04/08/2020 0906 Last data filed at 04/08/2020 0600 Gross per 24 hour  Intake 3000.44 ml  Output 425 ml  Net 2575.44 ml   Filed Weights   04/05/20 0018 04/06/20 1644  Weight: 62.1 kg 62.1 kg    Examination:  EOMI NCAT no focal deficit eyes predominantly closed during exam CTA B no added sound no rales no rhonchi Abdomen soft nontender no rebound no guarding Right lower extremity not examined today Sacral decubitus reviewed On 2/13    Neurologically intact   Data Reviewed: I have personally reviewed following labs and imaging studies   Baseline BUN/creatinine about 2.1-9 > 90/2.2-->->71/1.4 Calcium 6.2 but corrected calcium is 8.4 Potassium 3.0 White count 22.6--> 21.0-->16.5--insulin 14.1 Hemoglobin 9.7--> 6.5-->Feraheme  COVID-19 Labs  No results for input(s): DDIMER, FERRITIN, LDH, CRP in the last 72 hours.  Lab Results  Component Value Date   [place] NEGATIVE 04/03/2020   [place] NEGATIVE 02/08/2020   [place] NEGATIVE 01/04/2020   [place] NEGATIVE 07/17/2019     Radiology Studies: No results found.   Scheduled Meds: . (feeding supplement) PROSource Plus  30 mL Oral BID BM  . aspirin EC  81 mg Oral Daily  . atorvastatin  80 mg Oral QHS  . calcium  carbonate  1 tablet Oral BID WC  . Chlorhexidine Gluconate Cloth  6 each Topical Daily  . collagenase   Topical Daily  . DULoxetine  30 mg Oral Daily  . feeding supplement (GLUCERNA SHAKE)  237 mL Oral Q24H  . gabapentin  100 mg Oral QHS  . heparin  5,000 Units Subcutaneous Q8H  . lactulose  20 g Oral Daily  . melatonin  6 mg Oral QPM  . multivitamin with minerals  1 tablet Oral Daily  . nutrition supplement (JUVEN)  1 packet Oral BID BM  . potassium chloride  40 mEq Oral Daily  . saccharomyces boulardii  250 mg Oral BID   Continuous Infusions: . sodium chloride 125 mL/hr at 04/08/20 0322  . ertapenem 500 mg (04/07/20 1334)  . ferumoxytol       LOS: 5 days    Time spent: [place], MD [facility] To contact the attending provider between 7A-7P or the covering provider during after hours 7P-7A, please log into the web site [email or web address] and access using universal [place] password for that web site. If you do not have the password, please call the hospital operator.  04/08/2020, 9:06 AM

## 2020-04-08 NOTE — Progress Notes (Addendum)
CRITICAL VALUE ALERT  Critical Value:  Hgb: 6.5  Date & Time Notied: 04/08/20 7:37am  Provider Notified: Nita Sells  Orders Received/Actions taken: Minimize blood draws.   Jerene Pitch

## 2020-04-08 NOTE — Progress Notes (Signed)
Subjective: [name] is a 75 y.o. female patient seen at bedside, resting comfortably in no acute distress s/p day #2 right heel wound debridement and bone biopsy with application of graft. Patient denies pain at surgical site.  No other acute issues noted.   Patient Active Problem List   Diagnosis Date Noted  . Open wound of heel 04/03/2020  . Osteomyelitis of toe ([place]) 02/22/2020  . Bleeding 01/27/2020  . CKD (chronic kidney disease) stage 3, GFR 30-59 ml/min (HCC) 01/27/2020  . Critical lower limb ischemia ([place]) 01/04/2020  . Skin pustule 10/20/2019  . Neurocognitive deficits 08/04/2019  . Controlled type 2 diabetes mellitus without complication ([place]) 92/33/0076  . CVA (cerebral vascular accident) ([place]) 07/23/2019  . Infection of pacemaker lead wire ([place]) 07/21/2019  . Fever   . History of transcatheter aortic valve replacement (TAVR)   . Septic encephalopathy 07/18/2019  . Sacral decubitus ulcer, stage II ([place]) 07/18/2019  . Bacteremia due to group B Streptococcus 07/18/2019  . Sepsis ([place]) 07/18/2019  . Complete heart block ([place])   . Chronic diastolic congestive heart failure ([place]) 02/07/2019  . AKI (acute kidney injury) ([place]) 07/22/2017  . Peripheral arterial disease ([place]) 06/18/2017  . B12 deficiency 05/01/2017  . Bilateral lower extremity edema 11/08/2015  . Aortic atherosclerosis ([place]) 06/07/2014  . Abnormality of gait 05/29/2012  . Constipation 03/16/2012  . HOCM (hypertrophic obstructive cardiomyopathy) ([place]) 06/11/2011  . Routine health maintenance 06/10/2010  . Hyperlipidemia   . Refusal of blood transfusions as patient is Jehovah's Witness   . History of adenocarcinoma of breast   . Osteoporosis   . Status post CVA   . Aortic stenosis s/p Tissure AVR 2006   . CAD (coronary artery disease)   . Hypertension   . Depression 01/23/2006  . DM (diabetes mellitus) ([place]) 03/25/1990  . Benign hypertensive heart and kidney disease with diastolic CHF, NYHA class 3  and CKD stage 3 ([place]) 1992  . Bradycardia 1992     Current Facility-Administered Medications:  .  (feeding supplement) PROSource Plus liquid 30 mL, 30 mL, Oral, BID BM, Samtani, Jai-Gurmukh, MD, 30 mL at 04/07/20 1054 .  0.9 %  sodium chloride infusion, , Intravenous, Continuous, Samtani, Jai-Gurmukh, MD, Last Rate: 75 mL/hr at 04/08/20 1029, Rate Change at 04/08/20 1029 .  acetaminophen (TYLENOL) tablet 650 mg, 650 mg, Oral, Q6H PRN, 650 mg at 04/06/20 1035 **OR** acetaminophen (TYLENOL) suppository 650 mg, 650 mg, Rectal, Q6H PRN, Marylyn Ishihara, Tyrone A, DO .  aspirin EC tablet 81 mg, 81 mg, Oral, Daily, Kyle, Tyrone A, DO, 81 mg at 04/08/20 0820 .  atorvastatin (LIPITOR) tablet 80 mg, 80 mg, Oral, QHS, Kyle, Tyrone A, DO, 80 mg at 04/07/20 2117 .  calcium carbonate (OS-CAL - dosed in mg of elemental calcium) tablet 500 mg of elemental calcium, 1 tablet, Oral, BID WC, Samtani, Jai-Gurmukh, MD, 500 mg of elemental calcium at 04/08/20 1030 .  Chlorhexidine Gluconate Cloth 2 % PADS 6 each, 6 each, Topical, Daily, Nita Sells, MD, 6 each at 04/08/20 [phone] .  collagenase (SANTYL) ointment, , Topical, Daily, Kyle, Tyrone A, DO, Given at 04/07/20 1055 .  Darbepoetin Alfa (ARANESP) injection 40 mcg, 40 mcg, Subcutaneous, Weekly, Nita Sells, MD .  Derrill Memo ON 04/09/2020] DULoxetine (CYMBALTA) DR capsule 20 mg, 20 mg, Oral, Daily, Samtani, Jai-Gurmukh, MD .  ertapenem (INVANZ) 500 mg in sodium chloride 0.9 % 50 mL IVPB, 500 mg, Intravenous, Q24H, Samtani, Jai-Gurmukh, MD, Last Rate: 100 mL/hr at 04/07/20 1334, 500  mg at 04/07/20 1334 .  feeding supplement (GLUCERNA SHAKE) (GLUCERNA SHAKE) liquid 237 mL, 237 mL, Oral, Q24H, Samtani, Jai-Gurmukh, MD, 237 mL at 04/08/20 0833 .  ferumoxytol (FERAHEME) 510 mg in sodium chloride 0.9 % 100 mL IVPB, 510 mg, Intravenous, Weekly, Samtani, Jai-Gurmukh, MD, Last Rate: 468 mL/hr at 04/08/20 1034, 510 mg at 04/08/20 1034 .  heparin injection 5,000 Units, 5,000  Units, Subcutaneous, Q8H, Kyle, Tyrone A, DO, 5,000 Units at 04/08/20 0521 .  HYDROcodone-acetaminophen (NORCO/VICODIN) 5-325 MG per tablet 1 tablet, 1 tablet, Oral, Q4H PRN, Samtani, Jai-Gurmukh, MD .  lactulose (CHRONULAC) 10 GM/15ML solution 20 g, 20 g, Oral, Daily, Kyle, Tyrone A, DO, 20 g at 04/06/20 1036 .  multivitamin with minerals tablet 1 tablet, 1 tablet, Oral, Daily, Kyle, Tyrone A, DO, 1 tablet at 04/08/20 0820 .  nutrition supplement (JUVEN) (JUVEN) powder packet 1 packet, 1 packet, Oral, BID BM, Nita Sells, MD, 1 packet at 04/08/20 1030 .  ondansetron (ZOFRAN) tablet 4 mg, 4 mg, Oral, Q12H PRN, Marylyn Ishihara, Tyrone A, DO .  potassium chloride SA (KLOR-CON) CR tablet 40 mEq, 40 mEq, Oral, Daily, Samtani, Jai-Gurmukh, MD, 40 mEq at 04/08/20 0819 .  saccharomyces boulardii (FLORASTOR) capsule 250 mg, 250 mg, Oral, BID, Kyle, Tyrone A, DO, 250 mg at 04/08/20 0820  Allergies  Allergen Reactions  . Doxycycline     Made her stopped eating   . 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," as the patient is a Jehovah's Witness     Objective: Today's Vitals   04/08/20 0400 04/08/20 0500 04/08/20 0523 04/08/20 0900  BP: (!) 105/45 (!) 111/92 (!) 105/52 (!) 112/55  Pulse:  61 60 60  Resp: $Remo'11 12 13 11  'EKskV$ Temp:   97.6 F (36.4 C)   TempSrc:   Axillary   SpO2:   100% 99%  Weight:      Height:      PainSc:       Labs reviewed  General: No acute distress  Right lower extremity: Dressing to right foot clean, dry, intact. No strikethrough noted, upon removal there was mild bloody drainage in the inner gauze layers with the graft site appearing healthy and granular.  No erythema.  No malodor.  No edema.  Graft appears to be incorporating and skin staples intact.  Capillary fill time 5 seconds to toes on right. No other acute signs of infection. No calf pain.    Results for orders placed or performed during the hospital encounter of 04/03/20  Culture, blood (routine x 2)      Status: None (Preliminary result)   Collection Time: 04/03/20  2:45 PM   Specimen: BLOOD  Result Value Ref Range Status   Specimen Description   Final    BLOOD RIGHT ARM Performed at [facility] [address], [place], [place] [address]    Special Requests   Final    BOTTLES DRAWN AEROBIC AND ANAEROBIC Blood Culture adequate volume Performed at [facility] [address], [place], [place] [address]    Culture   Final    NO GROWTH 4 DAYS Performed at [facility], [place] [address] [place], [place] [address]    Report Status PENDING  Incomplete  Resp Panel by RT-PCR (Flu A&B, Covid) Nasopharyngeal Swab     Status: None   Collection Time: 04/03/20  2:46 PM   Specimen: Nasopharyngeal Swab; Nasopharyngeal(NP) swabs in vial transport medium  Result Value Ref Range  Status   SARS Coronavirus 2 by RT PCR NEGATIVE NEGATIVE Final    Comment: (NOTE) SARS-CoV-2 target nucleic acids are NOT DETECTED.  The SARS-CoV-2 RNA is generally detectable in upper respiratory specimens during the acute phase of infection. The lowest concentration of SARS-CoV-2 viral copies this assay can detect is 138 copies/mL. A negative result does not preclude SARS-Cov-2 infection and should not be used as the sole basis for treatment or other patient management decisions. A negative result may occur with  improper specimen collection/handling, submission of specimen other than nasopharyngeal swab, presence of viral mutation(s) within the areas targeted by this assay, and inadequate number of viral copies(<138 copies/mL). A negative result must be combined with clinical observations, patient history, and epidemiological information. The expected result is Negative.  Fact Sheet for Patients:  EntrepreneurPulse.com.au  Fact Sheet for Healthcare Providers:  IncredibleEmployment.be  This test is no t yet approved or cleared by the  [place] FDA and  has been authorized for detection and/or diagnosis of SARS-CoV-2 by FDA under an Emergency Use Authorization (EUA). This EUA will remain  in effect (meaning this test can be used) for the duration of the COVID-19 declaration under Section 564(b)(1) of the Act, 21 U.S.C.section 360bbb-3(b)(1), unless the authorization is terminated  or revoked sooner.       Influenza A by PCR NEGATIVE NEGATIVE Final   Influenza B by PCR NEGATIVE NEGATIVE Final    Comment: (NOTE) The Xpert Xpress SARS-CoV-2/FLU/RSV plus assay is intended as an aid in the diagnosis of influenza from Nasopharyngeal swab specimens and should not be used as a sole basis for treatment. Nasal washings and aspirates are unacceptable for Xpert Xpress SARS-CoV-2/FLU/RSV testing.  Fact Sheet for Patients: EntrepreneurPulse.com.au  Fact Sheet for Healthcare Providers: IncredibleEmployment.be  This test is not yet approved or cleared by the [place] FDA and has been authorized for detection and/or diagnosis of SARS-CoV-2 by FDA under an Emergency Use Authorization (EUA). This EUA will remain in effect (meaning this test can be used) for the duration of the COVID-19 declaration under Section 564(b)(1) of the Act, 21 U.S.C. section 360bbb-3(b)(1), unless the authorization is terminated or revoked.  Performed at [facility], [place] [address], [place], [place] [address]   MRSA PCR Screening     Status: None   Collection Time: 04/06/20  3:30 PM   Specimen: Nasal Mucosa; Nasopharyngeal  Result Value Ref Range Status   MRSA by PCR NEGATIVE NEGATIVE Final    Comment:        The GeneXpert MRSA Assay (FDA approved for NASAL specimens only), is one component of a comprehensive MRSA colonization surveillance program. It is not intended to diagnose MRSA infection nor to guide or monitor treatment for MRSA infections. Performed at [facility], [place] [address] [place], [place] [address]     Pathology still pending  Assessment and Plan:  Problem List Items Addressed This Visit   None   Visit Diagnoses    Pressure injury of skin of sacral region, unspecified injury stage    -  Primary   Wound infection       Relevant Medications   cefdinir (OMNICEF) 300 MG capsule   cefTRIAXone (ROCEPHIN) 1 g injection   ertapenem (INVANZ) 500 mg in sodium chloride 0.9 % 50 mL IVPB   Pressure injury of skin of right heel, unspecified injury stage       Toe osteomyelitis (HCC)       Relevant Medications  cefdinir (OMNICEF) 300 MG capsule   cefTRIAXone (ROCEPHIN) 1 g injection   ertapenem (INVANZ) 500 mg in sodium chloride 0.9 % 50 mL IVPB     -Patient seen and evaluated at bedside -Chart reviewed -Dressing change performed applied IntraSite wound gel to the right heel cover with dry dressing orders placed for nursing to assist with dressing changes every other day starting on Tuesday -Awaiting intra-op bone biopsy results; continue with IV antibiotics until cultures have resulted  -Continue with heel offloading -Continue with rest and elevation to assist with pain and edema control  -Patient may be transferred from bed to bedside chair and turned routinely according to nursing protocol to prevent worsening of pressure sores -Podiatry will continue to follow closely  -Family was updated via phone message on the status of her heel wound and its improvements  -Anticipated discharge plan of care: To rehab or SNF with antibiotics for 2 to 4 weeks based on culture results. Patient after discharge to follow up in office and with wound care center within 1-2 weeks.    Dr. Landis Martins, DPM [facility] [phone] office [phone] cell

## 2020-04-09 ENCOUNTER — Encounter (HOSPITAL_COMMUNITY): Payer: Self-pay | Admitting: Sports Medicine

## 2020-04-09 DIAGNOSIS — L89159 Pressure ulcer of sacral region, unspecified stage: Secondary | ICD-10-CM | POA: Diagnosis not present

## 2020-04-09 DIAGNOSIS — N182 Chronic kidney disease, stage 2 (mild): Secondary | ICD-10-CM

## 2020-04-09 DIAGNOSIS — B964 Proteus (mirabilis) (morganii) as the cause of diseases classified elsewhere: Secondary | ICD-10-CM | POA: Diagnosis not present

## 2020-04-09 DIAGNOSIS — M86171 Other acute osteomyelitis, right ankle and foot: Secondary | ICD-10-CM

## 2020-04-09 LAB — CBC WITH DIFFERENTIAL/PLATELET
Abs Immature Granulocytes: 0.31 10*3/uL — ABNORMAL HIGH (ref 0.00–0.07)
Basophils Absolute: 0 10*3/uL (ref 0.0–0.1)
Basophils Relative: 0 %
Eosinophils Absolute: 0.4 10*3/uL (ref 0.0–0.5)
Eosinophils Relative: 3 %
HCT: 23.6 % — ABNORMAL LOW (ref 36.0–46.0)
Hemoglobin: 7.4 g/dL — ABNORMAL LOW (ref 12.0–15.0)
Immature Granulocytes: 2 %
Lymphocytes Relative: 18 %
Lymphs Abs: 2.3 10*3/uL (ref 0.7–4.0)
MCH: 30.6 pg (ref 26.0–34.0)
MCHC: 31.4 g/dL (ref 30.0–36.0)
MCV: 97.5 fL (ref 80.0–100.0)
Monocytes Absolute: 1.2 10*3/uL — ABNORMAL HIGH (ref 0.1–1.0)
Monocytes Relative: 9 %
Neutro Abs: 8.9 10*3/uL — ABNORMAL HIGH (ref 1.7–7.7)
Neutrophils Relative %: 68 %
Platelets: 255 10*3/uL (ref 150–400)
RBC: 2.42 MIL/uL — ABNORMAL LOW (ref 3.87–5.11)
RDW: 17.6 % — ABNORMAL HIGH (ref 11.5–15.5)
WBC: 13.2 10*3/uL — ABNORMAL HIGH (ref 4.0–10.5)
nRBC: 0.2 % (ref 0.0–0.2)

## 2020-04-09 LAB — COMPREHENSIVE METABOLIC PANEL
ALT: 17 U/L (ref 0–44)
AST: 21 U/L (ref 15–41)
Albumin: 1.4 g/dL — ABNORMAL LOW (ref 3.5–5.0)
Alkaline Phosphatase: 112 U/L (ref 38–126)
Anion gap: 7 (ref 5–15)
BUN: 79 mg/dL — ABNORMAL HIGH (ref 8–23)
CO2: 18 mmol/L — ABNORMAL LOW (ref 22–32)
Calcium: 8.1 mg/dL — ABNORMAL LOW (ref 8.9–10.3)
Chloride: 114 mmol/L — ABNORMAL HIGH (ref 98–111)
Creatinine, Ser: 1.76 mg/dL — ABNORMAL HIGH (ref 0.44–1.00)
GFR, Estimated: 30 mL/min — ABNORMAL LOW (ref >60)
Glucose, Bld: 108 mg/dL — ABNORMAL HIGH (ref 70–99)
Potassium: 3.9 mmol/L (ref 3.5–5.1)
Sodium: 139 mmol/L (ref 135–145)
Total Bilirubin: 0.9 mg/dL (ref 0.3–1.2)
Total Protein: 4.8 g/dL — ABNORMAL LOW (ref 6.5–8.1)

## 2020-04-09 LAB — GLUCOSE, CAPILLARY
Glucose-Capillary: 91 mg/dL (ref 70–99)
Glucose-Capillary: 80 mg/dL (ref 70–99)
Glucose-Capillary: 138 mg/dL — ABNORMAL HIGH (ref 70–99)
Glucose-Capillary: 148 mg/dL — ABNORMAL HIGH (ref 70–99)

## 2020-04-09 MED ORDER — KETOROLAC TROMETHAMINE 15 MG/ML IJ SOLN
15.0000 mg | Freq: Once | INTRAMUSCULAR | Status: AC
  Administered 2020-04-09: 15 mg via INTRAVENOUS
  Filled 2020-04-09: qty 1

## 2020-04-09 MED ORDER — NALOXONE HCL 0.4 MG/ML IJ SOLN
0.4000 mg | INTRAMUSCULAR | Status: DC | PRN
  Filled 2020-04-09: qty 1

## 2020-04-09 MED ORDER — IBUPROFEN 200 MG PO TABS
400.0000 mg | ORAL_TABLET | Freq: Four times a day (QID) | ORAL | Status: DC | PRN
  Administered 2020-04-10: 400 mg via ORAL
  Filled 2020-04-09: qty 2

## 2020-04-09 NOTE — Care Management Important Message (Signed)
Important Message  Patient Details IM Letter given to the Patient. Name: [name] MRN: [id number] Date of Birth: Jan 08, 1946   Medicare Important Message Given:  Yes     Kerin Salen 04/09/2020, 12:33 PM

## 2020-04-09 NOTE — Progress Notes (Signed)
04/09/20 1015  Hydrotherapy treatment  Subjective Assessment  Subjective Pt sleeping most of session.  Patient and Family Stated Goals agreed to wound care, get better  Date of Onset  (present on admission from SNF)  Prior Treatments unsure.  Pressure Injury 04/04/20 Sacrum Medial Unstageable - Full thickness tissue loss in which the base of the injury is covered by slough (yellow, tan, gray, green or brown) and/or eschar (tan, brown or black) in the wound bed. Physical therapy only-  sacral   Date First Assessed/Time First Assessed: 04/04/20 1030   Location: Sacrum  Location Orientation: Medial  Staging: Unstageable - Full thickness tissue loss in which the base of the injury is covered by slough (yellow, tan, gray, green or brown) and/or ...  Dressing Type ABD;Gauze (Comment);Moist to dry;Santyl;Silicone dressing (silicone drssing to inferior border of peri wound)  Dressing Changed  Dressing Change Frequency Twice a day  State of Healing Eschar  Site / Wound Assessment Bleeding;Black;Yellow  % Wound base Red or Granulating 0%  % Wound base Yellow/Fibrinous Exudate 10%  % Wound base Black/Eschar 90%  Peri-wound Assessment Erythema (non-blanchable)  Wound Length (cm) 6 cm  Wound Width (cm) 7 cm  Wound Depth (cm) 1 cm  Wound Surface Area (cm^2) 42 cm^2  Wound Volume (cm^3) 42 cm^3  Margins Unattached edges (unapproximated)  Drainage Amount Minimal (appears recnet dressing changed)  Drainage Description Odor  Treatment Debridement (Selective);Hydrotherapy (Pulse lavage);Packing (Saline gauze)  Hydrotherapy  Pulsed Lavage with Suction (psi) 12 psi  Pulsed Lavage with Suction - Normal Saline Used 1000 mL  Pulsed Lavage Tip Tip with splash shield  Pulsed lavage therapy - wound location sacral  Selective Debridement  Selective Debridement - Location sacral  Selective Debridement - Tools Used Forceps;Scissors;Scalpel  Selective Debridement - Tissue Removed moderate amount of black  eschar, minimal slough  Wound Therapy - Assess/Plan/Recommendations  Wound Therapy - Clinical Statement Patient comes from LTC SNF, is bed bound for several weeks per patient. Patient has right heel wound/osteo being followed by podiatry. Patient will benefit from PT for hydrotherapy to decrease necrotic and increase granulation, improve qualitiy of wound and minimize bioburden effects.Brooke Meuth PA in  to debride necrotic  tissue. some bleeding which stopped with pressure. Patient very somnolent. Requires max stimulation to arouse. During [place], patient did not indicate any pain but  did indicate pain after Hydro completed. Will premedicate prior to hydro treatment.  Wound Therapy - Functional Problem List bed bound, total care  Factors Delaying/Impairing Wound Healing Diabetes Mellitus;Incontinence  Hydrotherapy Plan Debridement;Dressing change;Patient/family education;Pulsatile lavage with suction  Wound Therapy - Frequency 6X / week  Wound Therapy - Current Recommendations Case manager/social work  Wound Therapy - Follow Up Recommendations Skilled nursing facility  Wound Plan Continue with PLS, conservative sharp debridement and dressing changes to decrease bioburden and necrotic tissue.  Wound Therapy Goals - Improve the function of patient's integumentary system by progressing the wound(s) through the phases of wound healing by:  Decrease Necrotic Tissue to 50  Decrease Necrotic Tissue - Progress Progressing toward goal  Increase Granulation Tissue to 50  Increase Granulation Tissue - Progress Progressing toward goal  Patient/Family will be able to  direct pressure relief from sacrum  Patient/Family Instruction Goal - Progress Progressing toward goal  Goals/treatment plan/discharge plan were made with and agreed upon by patient/family Yes  Time For Goal Achievement 2 weeks  Wound Therapy - Potential for Goals Fair  Blanchard Kelch PT Acute Rehabilitation Services Pager [phone] Office  [phone] Time [phone]

## 2020-04-09 NOTE — Consult Note (Signed)
[facility]    Date of Admission:  04/03/2020     Reason for Consult: Osteo     Referring Physician: Dr Verlon Au   ASSESSMENT:    1. Right foot osteomyelitis - outpatient cultures with Morganella morganii 2. Sacral decub ulcer 3. CKD  PLAN:    . Ertapenem 527m IV daily . Will need central access (PICC vs tunneled line given her advanced CKD) . Wound care, glycemic control . Please call with questions.  Diagnosis: Right foot osteo  Culture Result: Morganella morganii  Allergies  Allergen Reactions  . Doxycycline     Made her stopped eating   . 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," as the patient is a Jehovah's Witness    OPAT Orders Discharge antibiotics to be given via PICC line Discharge antibiotics: Per pharmacy protocol  Ertapenem [id number]  Duration: 6 weeks End Date: 05/18/20  [facility]  Home health RN for IV administration and teaching; PICC line care and labs.    Labs weekly while on IV antibiotics: _x_ CBC with differential _x_ BMP __ CMP _x_ CRP _x_ ESR __ Vancomycin trough __ CK  _x_ Please pull PIC at completion of IV antibiotics __ Please leave PIC in place until doctor has seen patient or been notified  Fax weekly labs to ([phone] Follow Up Appt: 3/16 at 145pm with StJanene Madeira HPI:    Audrey MARTELLEs a [age].o. female who is a SNF resident, wheelchair bound, CKD, PAD, DM, prior CVA admitted 04/03/20 for right foot osteo and sacral decub ulcer.    She has been treated outpatient at the wound center for right great toe OM and right heel wound where a culture on 1/25 grew Morganella morganii.  She also has sacral ulcer.  She went to the OR on 2/11 for debridement and biopsy with Dr StCannon Kettle She is also being followed by general surgery for a sacral decub ulcer.  We are asked to see for abx recommendations.    Past Medical History:  Diagnosis Date  . Acute  on chronic diastolic CHF (congestive heart failure) ([facility]  . Acute on chronic renal failure ([facility]  . Adenocarcinoma of breast ([facility]   Completed tamoxifen and had mastectomy.  . Aortic stenosis, severe    s/p aortic valve replacement with porcine valve 06/2004.  ECHO 2010 EF 6567%LVH, diastolic dysfxn, Bioprostetic aoritc valve, mild AS. ECHO 2013 EF 60%, Nl aortic artificial valve, dynamic obstruction in the outflow tract   Class IIb rec for annual TTE after 5 yrs. She had a TTE 2013    . Arthritis   . CAD (coronary artery disease) 2006   s/p CABG (5/06) w/ saphenous vein to RCA at time of AVR  . CKD (chronic kidney disease) stage 2, GFR 60-89 ml/min 06/03/2011   She is no longer taking NSAIDs.   . [place] KitchenVA (cerebral infarction) 2006   Post-op from AVR. Presumed embolic in nature. Carotid stenosis of R 60-79%. Repeat dopplers 4/10 no R stenosis and L stenosis of 1-29%.  . Depression    Controlled on Paxil  . Diabetes mellitus 1992   Dx 04/25/1990. Now insulin dependent, started 2008. On ACEI.   . Diverticulosis 2001  . History of 2019 novel coronavirus disease (COVID-19)   . Hyperlipidemia    Mgmt with a statin  . Hypertension    Requires 4 drug tx  .  Osteoporosis 2006   DEXA 10/06 : L femur T -2.8, R -2.7. Lumbar T -2.4. On bisphosphonates and  Calcium / Vit D.  . Peripheral vascular disease ([place])   . Presence of permanent cardiac pacemaker   . Refusal of blood transfusions as patient is Jehovah's Witness   . Stroke [facility])     Social History   Tobacco Use  . Smoking status: Never Smoker  . Smokeless tobacco: Never Used  Vaping Use  . Vaping Use: Never used  Substance Use Topics  . Alcohol use: Yes    Alcohol/week: 0.0 standard drinks    Comment: Occasional beer, monthly  . Drug use: No    Family History  Problem Relation Age of Onset  . Diabetes Mother   . Hypertension Mother   . Alzheimer's disease Mother   . Heart disease Father 29       AMI at age  45 and 63  . Mental illness Sister   . Heart disease Sister 62       AMI  . Kidney disease Sister     Allergies  Allergen Reactions  . Doxycycline     Made her stopped eating   . 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," as the patient is a Jehovah's Witness    Review of Systems  Constitutional: Negative for chills and fever.  Gastrointestinal: Negative.   Musculoskeletal: Negative for joint pain.  All other systems reviewed and are negative.   OBJECTIVE:   Blood pressure (!) 104/54, pulse 60, temperature 97.6 F (36.4 C), temperature source Oral, resp. rate (!) 9, height _0  (1.676 m), weight 62.1 kg, SpO2 100 %. Body mass index is 22.1 kg/m.  Physical Exam Constitutional:      Comments: Chronically ill appearing woman, lying in bed, NAD.   Cardiovascular:     Rate and Rhythm: Normal rate and regular rhythm.  Pulmonary:     Effort: Pulmonary effort is normal. No respiratory distress.  Abdominal:     General: There is no distension.     Palpations: Abdomen is soft.     Tenderness: There is no abdominal tenderness.  Skin:    Comments: Evidence of diminished vascular flow.  Right foot wrapped in clean, dry ACE bandage.   Neurological:     General: No focal deficit present.     Cranial Nerves: No cranial nerve deficit.  Psychiatric:        Mood and Affect: Mood normal.        Behavior: Behavior normal.     Lab Results: Lab Results  Component Value Date   WBC 13.2 (H) 04/09/2020   HGB 7.4 (L) 04/09/2020   HCT 23.6 (L) 04/09/2020   MCV 97.5 04/09/2020   PLT 255 04/09/2020    Lab Results  Component Value Date   NA 139 04/09/2020   K 3.9 04/09/2020   CO2 18 (L) 04/09/2020   GLUCOSE 108 (H) 04/09/2020   BUN 79 (H) 04/09/2020   CREATININE 1.76 (H) 04/09/2020   CALCIUM 8.1 (L) 04/09/2020   GFRNONAA 30 (L) 04/09/2020   GFRAA 35.08 02/15/2020    Lab Results  Component Value Date   ALT 17 04/09/2020   AST 21 04/09/2020   GGT 29 04/04/2020    ALKPHOS 112 04/09/2020   BILITOT 0.9 04/09/2020       Component Value Date/Time   CRP 4.3 (H) 07/16/2018 0543       Component Value Date/Time   ESRSEDRATE 93 (  H) 07/11/2018 2304    I have reviewed the micro and lab results in Epic.  Imaging: IMPRESSION: 1. Extensive soft tissue swelling thickening of the foot most pronounced about the first ray and posterior to the calcaneus with there is focal soft tissue. Subjacent areas of cortical irregularity are seen involving the first proximal and distal phalanges concerning for osteomyelitis. Some additional cortical irregularity and periostitis of the calcaneus could reflect an additional site of acute or chronic osteomyelitis. MR imaging, ideally with contrast if tolerable, is a more sensitive and specific modality further determination of osteomyelitis. 2. Additional chronic resorptive changes of the distal phalanx could reflect sequela of more remote small during chronic osteomyelitis as well. 3. Likely posttraumatic deformity of the head of the second proximal phalanx with volar subluxation across the second PIP. 4. Hammertoe deformities at the second through fourth rays. 5. Sclerotic and irregular changes at the hallucal sesamoidal complexes could reflect sequela of prior sesamoiditis. 6. Hallux valgus deformity and bunion formation of foot. 7. Extensive soft tissue swelling of the foot and lower extremity edema.  [facility] [phone] pager 04/09/2020, 1:25 PM   I spent greater than 110 minutes with the patient including greater than 50% of time in face to face counsel of the patient and in coordination of their care.

## 2020-04-09 NOTE — Progress Notes (Signed)
Chaplain responded to spiritual consult that family was desiring an Scientist, physiological. Chaplain noted during huddle that patient was reported by nurse to have altered mental status. Later, nurse said that patient was alert and oriented times two,(not 4)  and that when she was awake she was foggy. Some days are better than others, she said. Chaplain visited patient but she was sound asleep.  Chaplain connected with patient's daughter,[name], who is an Community education officer working on [employer] today.  [name] was emotional & tearful when chaplain explained that her mother's cognitive ability would preclude her completing an AD at this time, but nodded in agreement and said she understood.  Chaplain reassured her that if her cognition improves an AD can be completed, but that in the event one is not completed, she would chart that of the eight children [name] has, [name], listed on FaceSheet and [name] (with whom chaplain spoke) would be the first contacted by family wishes to make decisions on her behalf of their mother. Their mother is not married and her daughters are next of kin. Chaplain will refer this family to Danton Clap, who may followup with Ms. Armstrong for pastoral care & support.  Rev. Tamsen Snider Pager [phone]

## 2020-04-09 NOTE — Plan of Care (Signed)
  Problem: Nutrition: Goal: Adequate nutrition will be maintained Outcome: Progressing   Problem: Pain Managment: Goal: General experience of comfort will improve Outcome: Progressing   Problem: Activity: Goal: Risk for activity intolerance will decrease Outcome: Not Progressing

## 2020-04-09 NOTE — Progress Notes (Signed)
PROGRESS NOTE  [name]  VEL:[id number] DOB: 15-Aug-1945 DOA: 04/03/2020 PCP: Hendricks Limes, MD  Brief Narrative:   75 year old female skilled living facility resident-wheelchair-bound at baseline Jehovah's Witness-does not take transfusions critical right lower extremity ischemia status post angiography recanalization A NT tibial artery-balloon angioplasty on aspirin Plavix Aortic stenosis status post TAVR 03/01/2019 for, aortic valve repair 2006 HFpEF + CHB + PPM (placed 01/2019)-bacteremia group B strep 06/2019 status post PPM explantation and then subsequent [employer] Micra AV Prior coronavirus infection 06/2018 DM TY 2 CKD stage III Prior CVA Breast cancer status postmastectomy and chemotherapy  Presented from [facility] has been treated in the outpatient setting for osteomyelitis of the right great toe with doxycycline--on follow-up evaluation found to have acute kidney injury and was started on saline in the outpatient setting-found to have sacral decubitus and right heel ulcer Culture showed right heel ulcer was positive for Morganella and had been changed to cefdinir  Found to have mild hypotension white count elevated to 19.8 General surgery saw the patient for sacral ulcer and did bedside debridement during hospital stay Podiatry saw the patient in addition-patient was placed on Invanz CT scan was ordered Eventually underwent debridement first proximal phalanx-became slightly hypotensive but has stabilized  Assessment & Plan:   Active Problems:   Open wound of heel  1. Osteomyelitis of right heel-failed outpatient therapy on multiple agents 2. Grew Morganella in the outpatient setting a. CT scan 2/8 reveals first proximal distal phalangeal osteomyelitis underwent 2/11 debridement b. White count continues to decrease-ID rec's invanz thru 3/25--Placing PICC line c. Cut back fluids to 40 cc and d/c in am 3. Perioperative hypotension a. Remains somewhat  hypotensive b. Fluids as above 4. Sacral decubitus ulcer a. General surgery recommends hydrotherapy as per PT-Sharp debridement performed by general surgery at bedside 2/10 b. Santyl as needed c. Pain management with as needed ibuprofen stop all opiates as below 5. Slight confusion?  Metabolic encephalopathy a. Unsure of lack of sleep versus medication effect b. Discontinue opiates, Cymbalta, melatonin c. Given Narcan x1 with good effect 6. Normocytic anemia-Jehovah's Witness a. Holding Plavix at this time b. Hemoglobin was low iron levels are low-give Feraheme 2/13 and Aranesp 7. HOCM + bradycardia a. Metoprolol and hydralazine both on hold 8. AoS status post TAVR a. Plavix held-aspirin continued b. Toprol-XL 25 held given perioperative hypotension c. Outpatient may need to resume  9. AKI on admission superimposed on CKD 3B 10. Metabolic a. Kidney function is slowly improving she has a mild metabolic acidosis in addition 11. Hypokalemia hypocalcemia a. Replace potassium with K. Dur 40 daily and start calcium carbonate 500 twice daily b. Corrected calcium is close to normal 12. Acute urinary retention a. Retained urine on 2/10 therefore Foley placed 2/11 b. Needs outpatient monitor c. Voiding trial failed will continue Foley catheter in the outpatient  13. DM TY 2 with neuropathy a. holding gabapentin 100 at bedtime, stop combination insulin liraglutide b. Sliding scale coverage discontinued as mild hypoglycemia and not eating much 14. Depression a. Holding Remeron 7.5 at bedtime, Cymbalta 30 daily  DVT prophylaxis: Lovenox Code Status: Full Family Communication: Updated at the bedside [name] [phone] on 2/14 in detail Disposition:  Status is: Inpatient  Remains inpatient appropriate because:Hemodynamically unstable, Persistent severe electrolyte disturbances, Ongoing active pain requiring inpatient pain management and IV treatments appropriate due to intensity  of illness or inability to take PO  Dispo: The patient is from: SNF  Anticipated d/c is to: SNF              Anticipated d/c date is: 1 day -needs Tunnelled IV for long term ABx              Patient currently is not medically stable to d/c.   Difficult to place patient No   Consultants:   Podiatry  General surgery  Procedures: None yet  Antimicrobials: Invanz  Subjective:  Sleepy today arousable but not as interactive Just had debridement of sacrum tells me pain is 9/10 No chest pain States foot does not pain her at this time No fever no chills   Addend Reassess after Narcan much more approp and coherent and discussed planning with daughter  Objective: Vitals:   04/08/20 2000 04/09/20 0000 04/09/20 0400 04/09/20 0600  BP: (!) 110/52 (!) 104/54    Pulse: 67 62 65 60  Resp: 12 (!) 9  (!) 9  Temp: 97.9 F (36.6 C) 97.6 F (36.4 C) 97.6 F (36.4 C)   TempSrc: Oral Oral Oral   SpO2: 100% 100%  100%  Weight:      Height:        Intake/Output Summary (Last 24 hours) at 04/09/2020 1025 Last data filed at 04/09/2020 0100 Gross per 24 hour  Intake 1851.55 ml  Output 550 ml  Net 1301.55 ml   Filed Weights   04/05/20 0018 04/06/20 1644  Weight: 62.1 kg 62.1 kg    Examination:  Awake coherent CTA B no added sound no rales no rhonchi Abdomen soft nontender no rebound no guarding Right lower extremity not examined today See wound exam as per surgery who did pictures on 2/14 Neurologically intact   Data Reviewed: I have personally reviewed following labs and imaging studies   Baseline BUN/creatinine about 2.1-9 > 90/2.2-->->71/1.4-->79/1.7 Calcium 8.1 Potassium 3.9 White count 22.6--> 13.2 Hemoglobin 9.7--> 6.5-->Feraheme--7.4  COVID-19 Labs  No results for input(s): DDIMER, FERRITIN, LDH, CRP in the last 72 hours.  Lab Results  Component Value Date   [place] NEGATIVE 04/03/2020   [place] NEGATIVE 02/08/2020   [place] NEGATIVE  01/04/2020   [place] NEGATIVE 07/17/2019   Radiology Studies: No results found.  Scheduled Meds: . (feeding supplement) PROSource Plus  30 mL Oral BID BM  . aspirin EC  81 mg Oral Daily  . atorvastatin  80 mg Oral QHS  . calcium carbonate  1 tablet Oral BID WC  . Chlorhexidine Gluconate Cloth  6 each Topical Daily  . collagenase   Topical Daily  . darbepoetin (ARANESP) injection - NON-DIALYSIS  40 mcg Subcutaneous Weekly  . feeding supplement (GLUCERNA SHAKE)  237 mL Oral Q24H  . heparin  5,000 Units Subcutaneous Q8H  . ketorolac  15 mg Intravenous Once  . lactulose  20 g Oral Daily  . multivitamin with minerals  1 tablet Oral Daily  . nutrition supplement (JUVEN)  1 packet Oral BID BM  . potassium chloride  40 mEq Oral Daily  . saccharomyces boulardii  250 mg Oral BID   Continuous Infusions: . sodium chloride 75 mL/hr at 04/09/20 0036  . ertapenem 500 mg (04/08/20 1319)  . ferumoxytol 510 mg (04/08/20 1034)     LOS: 6 days    Time spent: [place], MD [facility] To contact the attending provider between 7A-7P or the covering provider during after hours 7P-7A, please log into the web site [email or web address] and access using universal [place] password for that web site. If you  do not have the password, please call the hospital operator.  04/09/2020, 10:25 AM

## 2020-04-09 NOTE — Progress Notes (Signed)
PHARMACY CONSULT NOTE FOR:  OUTPATIENT  PARENTERAL ANTIBIOTIC THERAPY (OPAT)  Indication: osteomyelitis Regimen: ertapenem 500mg  IV q24h End date: 05/18/2020  IV antibiotic discharge orders are pended. To discharging provider:  please sign these orders via discharge navigator,  Select New Orders & click on the button choice - Manage This Unsigned Work.     Thank you for allowing pharmacy to be a part of this patient's care.  Phillis Haggis 04/09/2020, 11:55 AM

## 2020-04-09 NOTE — Progress Notes (Signed)
Clamped pt's foley 04/08/20, patient did not verbalize that she had the urge to urinate on. Patient's foley had small to moderate sediment.  [name]

## 2020-04-09 NOTE — Progress Notes (Signed)
[facility] Progress Note  3 Days Post-Op  Subjective: CC-  Seen during hydrotherapy.  Objective: Vital signs in last 24 hours: Temp:  [97.4 F (36.3 C)-97.9 F (36.6 C)] 97.6 F (36.4 C) (02/14 0400) Pulse Rate:  [60-67] 60 (02/14 0600) Resp:  [9-13] 9 (02/14 0600) BP: (104-122)/(52-57) 104/54 (02/14 0000) SpO2:  [100 %] 100 % (02/14 0600) Last BM Date: 04/07/20  Intake/Output from previous day: 02/13 0701 - 02/14 0700 In: 1851.6 [P.O.:120; I.V.:1564.6; IV Piggyback:167] Out: 550 [Urine:550] Intake/Output this shift: No intake/output data recorded.  PE: Gen:  Lethargic, NAD Pulm:  rate and effort normal Skin: warm and dry GU: total wound measures 7x6cm (opening is 5x5cm) with 2cm depth, 1cm circumferential undermining with 2cm undermining at 4 o'clock position. Bone is palpable but not fully exposed. Some necrotic tissue debrided from edges and distal aspect, some of the superior tan tissue appears to be periosteum       Lab Results:  Recent Labs    04/08/20 0721 04/09/20 0601  WBC 14.1* 13.2*  HGB 6.5* 7.4*  HCT 20.2* 23.6*  PLT 263 255   BMET Recent Labs    04/08/20 0721 04/09/20 0601  NA 138 139  K 3.0* 3.9  CL 116* 114*  CO2 14* 18*  GLUCOSE 85 108*  BUN 71* 79*  CREATININE 1.48* 1.76*  CALCIUM 6.2* 8.1*   PT/INR No results for input(s): LABPROT, INR in the last 72 hours. CMP     Component Value Date/Time   NA 139 04/09/2020 0601   NA 136 (A) 02/15/2020 0000   K 3.9 04/09/2020 0601   CL 114 (H) 04/09/2020 0601   CO2 18 (L) 04/09/2020 0601   GLUCOSE 108 (H) 04/09/2020 0601   BUN 79 (H) 04/09/2020 0601   BUN 101 (A) 02/15/2020 0000   CREATININE 1.76 (H) 04/09/2020 0601   CREATININE 0.92 06/07/2014 1151   CALCIUM 8.1 (L) 04/09/2020 0601   PROT 4.8 (L) 04/09/2020 0601   PROT 7.8 09/01/2018 1142   ALBUMIN 1.4 (L) 04/09/2020 0601   ALBUMIN 4.2 09/01/2018 1142   AST 21 04/09/2020 0601   ALT 17 04/09/2020 0601   ALKPHOS 112  04/09/2020 0601   BILITOT 0.9 04/09/2020 0601   BILITOT 0.3 09/01/2018 1142   GFRNONAA 30 (L) 04/09/2020 0601   GFRNONAA 63 06/07/2014 1151   GFRAA 35.08 02/15/2020 0000   GFRAA 73 06/07/2014 1151   Lipase     Component Value Date/Time   LIPASE 17 02/07/2019 1323       Studies/Results: No results found.  Anti-infectives: Anti-infectives (From admission, onward)   Start     Dose/Rate Route Frequency Ordered Stop   04/04/20 1200  ertapenem (INVANZ) 500 mg in sodium chloride 0.9 % 50 mL IVPB        500 mg 100 mL/hr over 30 Minutes Intravenous Every 24 hours 04/04/20 0829     04/04/20 0845  ceFEPIme (MAXIPIME) 2 g in sodium chloride 0.9 % 100 mL IVPB  Status:  Discontinued        2 g 200 mL/hr over 30 Minutes Intravenous Every 24 hours 04/04/20 0836 04/04/20 0837   04/04/20 0845  metroNIDAZOLE (FLAGYL) tablet 500 mg  Status:  Discontinued        500 mg Oral Every 8 hours 04/04/20 0836 04/04/20 0837   04/03/20 1415  piperacillin-tazobactam (ZOSYN) IVPB 3.375 g        3.375 g 100 mL/hr over 30 Minutes Intravenous  Once 04/03/20 1401  04/03/20 1500       Assessment/Plan DM AKI on CKD-3B Aortic stenosis s/p TAVR on plavix - on ASA $Remo'81mg'UwWIB$ , plavix on hold HOCM S/p pacemaker PVD CAD s/p CABG 2006 Hx CVA Hx breast cancer Right heel osteomyelitis s/p debridement and removal of bone/bone biopsy and placement of Cytal wound graft Dr. Cannon Kettle 2/11 Wheelchair bound  Jehovah's witness  Sacral decubitus ulcer - S/p bedside debridement 2/10 - Wound slowly improving with current regimen. Do not recommend surgical intervention. Continue hydrotherapy and BID wet to dry dressing changes with santyl.  - We will see again Thursday, please call sooner with concerns  ID - invanz for right heel osteomyelitis 2/9>> FEN - HH/CM diet VTE - sq heparin Foley - in place Follow up - TBD   LOS: 6 days    Wellington Hampshire, [facility] 04/09/2020, 9:54 AM Please see Amion for  pager number during day hours 7:00am-4:30pm

## 2020-04-09 NOTE — TOC Progression Note (Signed)
Transition of Care [facility]) - Progression Note    Patient Details  Name: [name] MRN: [id number] Date of Birth: 1946/01/18  [facility]) CM/SW Contact  Joaquin Courts, RN Phone Number: 04/09/2020, 3:02 PM  Clinical Narrative:    CM spoke with patient's daughter [name] and provided list of bed offers along with medicare star ratings.  [name] reports she will be meeting with the rest of the family this evening to discuss facility options and try to make a decision.  TOC will follow-up tomorrow for choice.   Expected Discharge Plan: Long Term Nursing Home Barriers to Discharge: Continued Medical Work up  Expected Discharge Plan and Services Expected Discharge Plan: [place]     Post Acute Care Choice: Nursing Home                                         Social Determinants of Health (SDOH) Interventions    Readmission Risk Interventions No flowsheet data found.

## 2020-04-10 DIAGNOSIS — T148XXA Other injury of unspecified body region, initial encounter: Secondary | ICD-10-CM

## 2020-04-10 DIAGNOSIS — L089 Local infection of the skin and subcutaneous tissue, unspecified: Secondary | ICD-10-CM | POA: Diagnosis not present

## 2020-04-10 DIAGNOSIS — L89159 Pressure ulcer of sacral region, unspecified stage: Secondary | ICD-10-CM | POA: Diagnosis not present

## 2020-04-10 LAB — COMPREHENSIVE METABOLIC PANEL
ALT: 20 U/L (ref 0–44)
AST: 22 U/L (ref 15–41)
Albumin: 1.4 g/dL — ABNORMAL LOW (ref 3.5–5.0)
Alkaline Phosphatase: 118 U/L (ref 38–126)
Anion gap: 7 (ref 5–15)
BUN: 78 mg/dL — ABNORMAL HIGH (ref 8–23)
CO2: 18 mmol/L — ABNORMAL LOW (ref 22–32)
Calcium: 8.5 mg/dL — ABNORMAL LOW (ref 8.9–10.3)
Chloride: 115 mmol/L — ABNORMAL HIGH (ref 98–111)
Creatinine, Ser: 1.65 mg/dL — ABNORMAL HIGH (ref 0.44–1.00)
GFR, Estimated: 32 mL/min — ABNORMAL LOW (ref >60)
Glucose, Bld: 121 mg/dL — ABNORMAL HIGH (ref 70–99)
Potassium: 4.4 mmol/L (ref 3.5–5.1)
Sodium: 140 mmol/L (ref 135–145)
Total Bilirubin: 0.8 mg/dL (ref 0.3–1.2)
Total Protein: 5 g/dL — ABNORMAL LOW (ref 6.5–8.1)

## 2020-04-10 LAB — CBC WITH DIFFERENTIAL/PLATELET
Abs Immature Granulocytes: 0.46 10*3/uL — ABNORMAL HIGH (ref 0.00–0.07)
Basophils Absolute: 0.1 10*3/uL (ref 0.0–0.1)
Basophils Relative: 0 %
Eosinophils Absolute: 0.5 10*3/uL (ref 0.0–0.5)
Eosinophils Relative: 3 %
HCT: 24.8 % — ABNORMAL LOW (ref 36.0–46.0)
Hemoglobin: 7.7 g/dL — ABNORMAL LOW (ref 12.0–15.0)
Immature Granulocytes: 3 %
Lymphocytes Relative: 13 %
Lymphs Abs: 1.9 10*3/uL (ref 0.7–4.0)
MCH: 30.6 pg (ref 26.0–34.0)
MCHC: 31 g/dL (ref 30.0–36.0)
MCV: 98.4 fL (ref 80.0–100.0)
Monocytes Absolute: 1.4 10*3/uL — ABNORMAL HIGH (ref 0.1–1.0)
Monocytes Relative: 9 %
Neutro Abs: 10.5 10*3/uL — ABNORMAL HIGH (ref 1.7–7.7)
Neutrophils Relative %: 72 %
Platelets: 286 10*3/uL (ref 150–400)
RBC: 2.52 MIL/uL — ABNORMAL LOW (ref 3.87–5.11)
RDW: 18 % — ABNORMAL HIGH (ref 11.5–15.5)
WBC: 14.8 10*3/uL — ABNORMAL HIGH (ref 4.0–10.5)
nRBC: 0.3 % — ABNORMAL HIGH (ref 0.0–0.2)

## 2020-04-10 LAB — GLUCOSE, CAPILLARY
Glucose-Capillary: 90 mg/dL (ref 70–99)
Glucose-Capillary: 93 mg/dL (ref 70–99)
Glucose-Capillary: 91 mg/dL (ref 70–99)

## 2020-04-10 LAB — SURGICAL PATHOLOGY

## 2020-04-10 NOTE — Progress Notes (Signed)
04/10/20  1115  Hydrotherapy treatment   Subjective Assessment  Subjective Pt sleeping most of session. Did arouse more readily.  Patient and Family Stated Goals agreed to wound care, get better  Date of Onset  (present on admission from SNF)  Prior Treatments unsure.  Pressure Injury 04/04/20 Sacrum Medial Unstageable - Full thickness tissue loss in which the base of the injury is covered by slough (yellow, tan, gray, green or brown) and/or eschar (tan, brown or black) in the wound bed. Physical therapy only-  sacral   Date First Assessed/Time First Assessed: 04/04/20 1030   Location: Sacrum  Location Orientation: Medial  Staging: Unstageable - Full thickness tissue loss in which the base of the injury is covered by slough (yellow, tan, gray, green or brown)   Dressing Type ABD;Gauze (Comment);Moist to dry;Santyl;Silicone dressing (silicone drssing to inferior border of peri wound)  Dressing Changed  Dressing Change Frequency Twice a day  State of Healing Eschar  Site / Wound Assessment Bleeding;Black;Yellow  % Wound base Red or Granulating 0%  % Wound base Yellow/Fibrinous Exudate 10%  % Wound base Black/Eschar 90%  Peri-wound Assessment Erythema (non-blanchable), friable/excoriated at gluteal cleft/binferior border, placed mepilex  Wound Length (cm) 6 cm  Wound Width (cm) 7 cm  Wound Depth (cm) undermine 2.5 cm 3cm @ 4:00, 2cm from 1:00 -4:00. 2.5 @ 12:00  Wound Surface Area (cm^2) 42 cm^2  Wound Volume (cm^3) 42 cm^3  Margins Unattached edges (unapproximated)  Drainage Amount Mod   Drainage Description Odor, slight green  Treatment Debridement (Selective);Hydrotherapy (Pulse lavage);Packing (Saline gauze)  Hydrotherapy  Pulsed Lavage with Suction (psi) 12 psi  Pulsed Lavage with Suction - Normal Saline Used 1000 mL  Pulsed Lavage Tip Tip with splash shield  Pulsed lavage therapy - wound location sacral  Selective Debridement  Selective Debridement - Location sacral   Selective Debridement - Tools Used Forceps;Scissors;  Selective Debridement - Tissue Removed moderate amount of black eschar, minimal slough  Wound Therapy - Assess/Plan/Recommendations  Wound Therapy - Clinical Statement Patient  Somnolent but slightly more aroused today. Patient premedicated and did not indicate pain during hydro.  Wound remains with  Slough and eschar, very little granulation. Sacral bone palpable.  Continue PLS , selective sharp debridement.  Noted  Very light  Green on old dressing. Will watch for pseudomonas signs.   Wound Therapy - Functional Problem List bed bound, total care  Factors Delaying/Impairing Wound Healing Diabetes Mellitus;Incontinence  Hydrotherapy Plan Debridement;Dressing change;Patient/family education;Pulsatile lavage with suction  Wound Therapy - Frequency 6X / week  Wound Therapy - Current Recommendations Case manager/social work  Wound Therapy - Follow Up Recommendations Skilled nursing facility  Wound Plan Continue with PLS, conservative sharp debridement and dressing changes to decrease bioburden and necrotic tissue.  Wound Therapy Goals - Improve the function of patient's integumentary system by progressing the wound(s) through the phases of wound healing by:  Decrease Necrotic Tissue to 50  Decrease Necrotic Tissue - Progress Progressing toward goal  Increase Granulation Tissue to 50  Increase Granulation Tissue - Progress Progressing toward goal  Patient/Family will be able to  direct pressure relief from sacrum  Patient/Family Instruction Goal - Progress Progressing toward goal  Goals/treatment plan/discharge plan were made with and agreed upon by patient/family Yes  Time For Goal Achievement 2 weeks  Wound Therapy - Potential for Goals Fair  [place] Pager [phone] Office [phone] Time [phone]           N

## 2020-04-10 NOTE — Progress Notes (Addendum)
Referring Physician(s): Dr. Mahala Menghini, Shela Commons  Supervising Physician: Ruel Favors  Patient Status:  [facility] - [facility]  Chief Complaint: Osteomyelitis, need long term IV abx treatment   Subjective: 75 year old female with osteomyelitis of right heal, s/p debridement on 04/06/20.  IR was requested for image guided tunneled IJ PICC placement.  Upon evaluation patient is laying on bed comfortably, not in distress. Denise headache, fever, chills, shortness of breath, cough, chest pain, abdominal pain, nausea ,vomiting, and bleeding.  Patient is [employer] not take transfusions.  Allergies: Doxycycline and Other  Medications: Prior to Admission medications   Medication Sig Start Date End Date Taking? Authorizing Provider  acetaminophen (TYLENOL) 325 MG tablet Take 650 mg by mouth daily. Not to exceed 3000 mg in 24 hour per stranding order 07/28/19  Yes [provider]  Amino Acids-Protein Hydrolys (FEEDING SUPPLEMENT, PRO-STAT SUGAR FREE 64,) LIQD Take 30 mLs by mouth in the morning and at bedtime.   Yes [provider]  amoxicillin (AMOXIL) 500 MG capsule Take 2,000 mg by mouth once. As needed prior to dental procedure   Yes [provider]  aspirin 81 MG EC tablet TAKE 1 TABLET (81 MG TOTAL) BY MOUTH DAILY. SWALLOW WHOLE. Patient taking differently: Take 81 mg by mouth daily. 09/25/18  Yes Aslam, Leanna Sato, MD  atorvastatin (LIPITOR) 80 MG tablet Take 1 tablet (80 mg total) by mouth at bedtime. 07/08/19  Yes Burns Spain, MD  cefdinir (OMNICEF) 300 MG capsule Take 300 mg by mouth 2 (two) times daily. 03/27/20  Yes [provider]  clopidogrel (PLAVIX) 75 MG tablet Take 1 tablet (75 mg total) by mouth daily with breakfast. Patient taking differently: Take 75 mg by mouth daily. 06/16/19  Yes Burns Spain, MD  diclofenac Sodium (VOLTAREN) 1 % GEL Apply 4 g topically 4 (four) times daily as needed (Pain). Apply to bilateral hips and knees   Yes  [provider]  DULoxetine (CYMBALTA) 30 MG capsule Take 30 mg by mouth daily.   Yes [provider]  Ensure (ENSURE) Take 237 mLs by mouth in the morning and at bedtime. Strawberry if available   Yes [provider]  gabapentin (NEURONTIN) 100 MG capsule Take 100 mg by mouth at bedtime.   Yes [provider]  hydrALAZINE (APRESOLINE) 25 MG tablet Take 25 mg by mouth 2 (two) times daily. Hold if blood pressure < 110   Yes [provider]  lactulose (CHRONULAC) 10 GM/15ML solution Take 20 g by mouth daily. 10/17/19  Yes [provider]  magnesium hydroxide (MILK OF MAGNESIA) 400 MG/5ML suspension Take 30 mLs by mouth daily as needed for mild constipation.   Yes [provider]  melatonin 3 MG TABS tablet Take 6 mg by mouth every evening.   Yes [provider]  metoprolol tartrate (LOPRESSOR) 25 MG tablet Take 12.5 mg by mouth 2 (two) times daily. 03/23/20  Yes [provider]  mirtazapine (REMERON) 15 MG tablet Take 7.5 mg by mouth at bedtime. 03/31/20  Yes [provider]  Multiple Vitamins-Minerals (DECUBI-VITE) CAPS Take 1 capsule by mouth daily.   Yes [provider]  NOVOLOG FLEXPEN 100 UNIT/ML FlexPen Inject 5 Units into the skin 2 (two) times daily. cbg >/= 150 03/27/20  Yes [provider]  Nutritional Supplement LIQD Take 120 mLs by mouth 2 (two) times daily. MedPass   Yes [provider]  Nutritional Supplements (NUTRITIONAL DRINK PO) Take 1 Container by mouth in  the morning, at noon, and at bedtime. Magic cup   Yes [provider]  ondansetron (ZOFRAN) 4 MG tablet Take 4 mg by mouth every 12 (twelve) hours as needed for nausea or vomiting. 03/23/20  Yes [provider]  saccharomyces boulardii (FLORASTOR) 250 MG capsule Take 250 mg by mouth 2 (two) times daily.   Yes [provider]  white petrolatum (VASELINE) GEL Apply 1 application topically daily.  Heels at bedtime   Yes [provider]  cefTRIAXone (ROCEPHIN) 1 g injection Inject 1 g into the muscle daily.    [provider]  Insulin Degludec-Liraglutide (XULTOPHY) 100-3.6 UNIT-MG/ML SOPN Inject 10 Units into the skin daily.    [provider]  metoprolol succinate (TOPROL-XL) 25 MG 24 hr tablet Take 1 tablet (25 mg total) by mouth daily. Patient not taking: Reported on 04/03/2020 09/19/19   Angelica Pou, MD     Vital Signs: BP (!) 114/49   Pulse 64   Temp (!) 97.4 F (36.3 C) (Oral)   Resp (!) 9   Ht $R'5\' 6"'pq$  (1.676 m)   Wt 136 lb 14.5 oz (62.1 kg)   SpO2 100%   BMI 22.10 kg/m   Constitutional:      Appearance: Normal appearance.  Cardiovascular:     Rate and Rhythm: Normal rate and regular rhythm.     Pulses: Normal pulses.     Heart sounds: Normal heart sounds.  Pulmonary:     Effort: Pulmonary effort is normal.     Breath sounds: Normal breath sounds.  Abdominal:     General: Abdomen is flat. Bowel sounds are normal.     Palpations: Abdomen is soft.  Neurological:     Mental Status: Patient is alert and oriented to person, place, and time.     Imaging: No results found.  Labs:  CBC: Recent Labs    04/07/20 0618 04/08/20 0721 04/09/20 0601 04/10/20 0530  WBC 16.5* 14.1* 13.2* 14.8*  HGB 7.2* 6.5* 7.4* 7.7*  HCT 22.3* 20.2* 23.6* 24.8*  PLT 273 263 255 286    COAGS: Recent Labs    07/17/19 2150  INR 0.9  APTT 27    BMP: Recent Labs    01/17/20 0000 01/25/20 0000 02/08/20 1827 02/13/20 0000 02/15/20 0000 04/03/20 1251 04/07/20 0618 04/08/20 0721 04/09/20 0601 04/10/20 0530  NA 137 138   < > 134* 136*   < > 137 138 139 140  K 4.3 4.7   < > 4.4 4.1   < > 3.6 3.0* 3.9 4.4  CL 103 101   < > 97* 101   < > 111 116* 114* 115*  CO2 23* 27*   < > 22 22   < > 15* 14* 18* 18*  GLUCOSE  --   --    < >  --   --    < > 68* 85 108* 121*  BUN 80* 64*   < > 106* 101*   < > 80* 71* 79* 78*  CALCIUM 8.7 8.7   < > 9.0  8.7   < > 6.9* 6.2* 8.1* 8.5*  CREATININE 2.0* 1.8*   < > 1.9* 1.6*   < > 2.00* 1.48* 1.76* 1.65*  GFRNONAA 24.08 27.41   < > 24.83 30.27   < > 25* 36* 30* 32*  GFRAA 27.91 31.77  --  28.78 35.08  --   --   --   --   --    < > =  values in this interval not displayed.    LIVER FUNCTION TESTS: Recent Labs    04/06/20 0552 04/07/20 0618 04/09/20 0601 04/10/20 0530  BILITOT 0.5 0.8 0.9 0.8  AST $Re'18 17 21 22  'SNK$ ALT $R'18 16 17 20  'GI$ ALKPHOS 129* 102 112 118  PROT 5.6* 4.4* 4.8* 5.0*  ALBUMIN 1.6* 1.3* 1.4* 1.4*    Assessment and Plan: 75 year old female with osteomyelitis of right heal, s/p debridement on 04/06/20.  IR was requested for image guided tunneled IJ PICC placement.  Not on blood thinner per patient and chart review.   Hgb 7.7, platelet 286 this morning.   *ADDENDUM* Patient on sq heparin, last does at 5 am. Informed Dr. Annamaria Boots.  Held 2 pm dose.   Risks and benefits of image guided tunneld IJ PICC placement was discussed with the patient including, but not limited to bleeding, infection, and damagind adjacent structures.   All of the patient's questions were answered, patient is agreeable to proceed. Consent signed and in chart.   Electronically Signed: Tera Mater, PA-C 04/10/2020, 11:44 AM   I spent a total of 15 Minutes at the the patient's bedside AND on the patient's hospital floor or unit, greater than 50% of which was counseling/coordinating care for  image guided tunneld IJ PICC placement

## 2020-04-10 NOTE — Progress Notes (Signed)
PODIATRY PROGRESS NOTE  [name]       XKG:[id number] DOB: 16-Dec-1945 DOA: 04/03/2020 PCP: Pecola Lawless, MD     Patient status post right foot wound debridement with bone biopsy and placement of Cytal wound graft. DOS: 04/06/2020  Patient resting comfortably in bed this evening.  Dressings are clean dry and intact.  Changed this a.m.  Past Medical History:  Diagnosis Date  . Acute on chronic diastolic CHF (congestive heart failure) (HCC) 06/22/2017  . Acute on chronic renal failure (HCC) 05/25/2011  . Adenocarcinoma of breast (HCC) 1996   Completed tamoxifen and had mastectomy.  . Aortic stenosis, severe    s/p aortic valve replacement with porcine valve 06/2004.  ECHO 2010 EF 65%, LVH, diastolic dysfxn, Bioprostetic aoritc valve, mild AS. ECHO 2013 EF 60%, Nl aortic artificial valve, dynamic obstruction in the outflow tract   Class IIb rec for annual TTE after 5 yrs. She had a TTE 2013    . Arthritis   . CAD (coronary artery disease) 2006   s/p CABG (5/06) w/ saphenous vein to RCA at time of AVR  . CKD (chronic kidney disease) stage 2, GFR 60-89 ml/min 06/03/2011   She is no longer taking NSAIDs.   Marland Kitchen CVA (cerebral infarction) 2006   Post-op from AVR. Presumed embolic in nature. Carotid stenosis of R 60-79%. Repeat dopplers 4/10 no R stenosis and L stenosis of 1-29%.  . Depression    Controlled on Paxil  . Diabetes mellitus 1992   Dx 04/25/1990. Now insulin dependent, started 2008. On ACEI.   . Diverticulosis 2001  . History of 2019 novel coronavirus disease (COVID-19)   . Hyperlipidemia    Mgmt with a statin  . Hypertension    Requires 4 drug tx  . Osteoporosis 2006   DEXA 10/06 : L femur T -2.8, R -2.7. Lumbar T -2.4. On bisphosphonates and  Calcium / Vit D.  . Peripheral vascular disease (HCC)   . Presence of permanent cardiac pacemaker   . Refusal of blood transfusions as patient is Jehovah's Witness   . Stroke [facility])      CBC Latest Ref Rng & Units 04/10/2020  04/09/2020 04/08/2020  WBC 4.0 - 10.5 K/uL 14.8(H) 13.2(H) 14.1(H)  Hemoglobin 12.0 - 15.0 g/dL 7.7(L) 7.4(L) 6.5(LL)  Hematocrit 36.0 - 46.0 % 24.8(L) 23.6(L) 20.2(L)  Platelets 150 - 400 K/uL 286 255 263   BMP Latest Ref Rng & Units 04/10/2020 04/09/2020 04/08/2020  Glucose 70 - 99 mg/dL 702(O) 378(H) 85  BUN 8 - 23 mg/dL 88(F) 02(D) 74(J)  Creatinine 0.44 - 1.00 mg/dL 2.87(O) 6.76(H) 2.09(O)  BUN/Creat Ratio 12 - 28 - - -  Sodium 135 - 145 mmol/L 140 139 138  Potassium 3.5 - 5.1 mmol/L 4.4 3.9 3.0(L)  Chloride 98 - 111 mmol/L 115(H) 114(H) 116(H)  CO2 22 - 32 mmol/L 18(L) 18(L) 14(L)  Calcium 8.9 - 10.3 mg/dL 7.0(J) 8.1(L) 6.2(LL)   Surgical pathology bone biopsy right 04/06/2020:  FINAL MICROSCOPIC DIAGNOSIS:   A. BONE, RIGHT HEEL, BIOPSY:  - Benign bone.  - No acute osteomyelitis.   Assessment/plan of care: Nonhealing wound right foot  -Bone biopsy results reviewed and pertinent labs reviewed.  -Continue dressing changes every other day  -Post discharge antibiotics as per ID.  -Follow-up within 1 week in office post discharge     Felecia Shelling, DPM [facility]  Dr. Felecia Shelling, DPM    [address]  [place], [place] [address]                Office [phone]  Fax [phone]

## 2020-04-10 NOTE — Progress Notes (Signed)
Pt intake poor today.  She has refused all but morning Juven and Prosource supplements.  Tried to encourage pt to eat, drink, take supplements, educated pt about nutritional status and wound healing.  Pt states she is not hungry.  Daughter at bedside most of the day and is aware.  Will continue to monitor.

## 2020-04-10 NOTE — TOC Progression Note (Signed)
Transition of Care [facility]) - Progression Note    Patient Details  Name: [name] MRN: [id number] Date of Birth: Jun 02, 1945  Transition of Care [facility]) CM/SW Contact  Joaquin Courts, RN Phone Number: 04/10/2020, 4:32 PM  Clinical Narrative:    CM spoke with daughter [name], who confirms family selects [facility] for SNF.  Facility rep made aware of choice.   Expected Discharge Plan: Long Term Nursing Home Barriers to Discharge: Continued Medical Work up  Expected Discharge Plan and Services Expected Discharge Plan: [place]     Post Acute Care Choice: Nursing Home                                         Social Determinants of Health (SDOH) Interventions    Readmission Risk Interventions No flowsheet data found.

## 2020-04-10 NOTE — Progress Notes (Addendum)
PROGRESS NOTE  [name]  TAV:[id number] DOB: [birth year] DOA: 04/03/2020 PCP: Hendricks Limes, MD  Brief Narrative:   75 year old female skilled living facility resident-wheelchair-bound at baseline Jehovah's Witness-does not take transfusions critical right lower extremity ischemia status post angiography recanalization A NT tibial artery-balloon angioplasty on aspirin Plavix Aortic stenosis status post TAVR 03/01/2019 for, aortic valve repair 2006 HFpEF + CHB + PPM (placed 01/2019)-bacteremia group B strep 06/2019 status post PPM explantation and then subsequent [employer] Micra AV Prior coronavirus infection 06/2018 DM TY 2 CKD stage III Prior CVA Breast cancer status postmastectomy and chemotherapy  Presented from [facility] has been treated in the outpatient setting for osteomyelitis of the right great toe with doxycycline--on follow-up evaluation found to have acute kidney injury and was started on saline in the outpatient setting-found to have sacral decubitus and right heel ulcer Culture showed right heel ulcer was positive for Morganella and had been changed to cefdinir  Found to have mild hypotension white count elevated to 19.8 General surgery saw the patient for sacral ulcer and did bedside debridement during hospital stay Podiatry saw the patient in addition-patient was placed on Invanz CT scan was ordered Eventually underwent debridement first proximal phalanx-became slightly hypotensive but has stabilized  Assessment & Plan:   Active Problems:   Open wound of heel  1. Osteomyelitis of right heel-failed outpatient therapy on multiple agents 2. Grew Morganella in the outpatient setting a. CT scan 2/8 reveals first proximal distal phalangeal osteomyelitis underwent 2/11 debridement b. White count continues to decrease-ID rec's invanz thru 3/25--Placing tunneled PICC line c. Saline lock 2/15 3. Perioperative hypotension a. Remains somewhat hypotensive b. Fluids cut  back-monitor trends 4. Sacral decubitus ulcer a. General surgery recommends hydrotherapy as per PT-Sharp debridement performed by general surgery at bedside 2/10 b. Santyl as needed c. Pain management with as needed ibuprofen stop all opiates as below 5. Slight confusion?  Metabolic encephalopathy a. Unsure of lack of sleep versus medication effect b. Discontinue opiates, Cymbalta, melatonin c. Given Narcan x1 with good effect on 2/14 6. Normocytic anemia-Jehovah's Witness a. Holding Plavix at this time b. Hemoglobin was low iron levels are low-give Feraheme 2/13 and Aranesp 7. HOCM + bradycardia a. Metoprolol and hydralazine both on hold 8. AoS status post TAVR a. Plavix held-aspirin continued b. Toprol-XL 25 held given perioperative hypotension c. See below discussion regarding Plavix 9. AKI on admission superimposed on CKD 3B 10. Metabolic a. Kidney function is slowly improving she has a mild metabolic acidosis in addition 11. Hypokalemia hypocalcemia a. Continue K. Dur 40 daily  b. calcium carbonate 500 twice daily 12. Acute urinary retention a. Retained urine on 2/10 therefore Foley placed 2/11 b. Needs outpatient monitor c. Voiding trial failed will continue Foley catheter in the outpatient  13. DM TY 2 with neuropathy a. holding gabapentin 100 at bedtime, stop combination insulin liraglutide b. Sliding scale coverage discontinued as mild hypoglycemia and not eating much 14. Depression a. Holding Remeron 7.5 at bedtime, Cymbalta 30 daily  Addendum It looks like after careful chart review patient had a perioperative stroke after valve repair which may be why patient was started on Plavix Would hold Plavix for the time being and as an outpatient if hemoglobin is above 8 consistently can continue  DVT prophylaxis: Lovenox Code Status: Full Family Communication: Updated at the bedside [name] [phone] on 2/15 Disposition:  Status is: Inpatient  Remains  inpatient appropriate because:Hemodynamically unstable, Persistent severe electrolyte disturbances, Ongoing active pain requiring inpatient  pain management and IV treatments appropriate due to intensity of illness or inability to take PO  Dispo: The patient is from: SNF              Anticipated d/c is to: SNF              Anticipated d/c date is: 1 day -needs Tunnelled IV for long term ABx              Patient currently is not medically stable to d/c.   Difficult to place patient No   Consultants:   Podiatry  General surgery  Procedures: None yet  Antimicrobials: Invanz  Subjective:  Awake appropriate no distress Eating minimally No chest pain no fever Unclear if she is going to be able to get tunneled catheter today  Objective: Vitals:   04/10/20 0000 04/10/20 0506 04/10/20 0600 04/10/20 1242  BP: (!) 121/56 (!) 101/55 (!) 114/49 (!) 110/53  Pulse: 61 62 64 95  Resp: 11 12 (!) 9 10  Temp: 97.6 F (36.4 C) (!) 97.4 F (36.3 C)  (!) 97.4 F (36.3 C)  TempSrc: Oral Oral  Oral  SpO2: 100% 100% 100% (!) 80%  Weight:      Height:        Intake/Output Summary (Last 24 hours) at 04/10/2020 1420 Last data filed at 04/10/2020 0737 Gross per 24 hour  Intake 1655.6 ml  Output 1075 ml  Net 580.6 ml   Filed Weights   04/05/20 0018 04/06/20 1644  Weight: 62.1 kg 62.1 kg    Examination:  Coherent pleasant no distress EOMI NCAT No icterus no pallor Chest clear no added sound Abdomen soft no rebound No lower extremity edema Neurologically intact moving all four limbs but quite weak Wound not examined on foot today   Data Reviewed: I have personally reviewed following labs and imaging studies   BUNs/creatinine 70/1.6 Bicarb 18 Chloride 115 White count 22.6--> 13.2> 14.8 Hemoglobin 9.7--> 6.5-->Feraheme--7.7  COVID-19 Labs  No results for input(s): DDIMER, FERRITIN, LDH, CRP in the last 72 hours.  Lab Results  Component Value Date   [place] NEGATIVE  04/03/2020   [place] NEGATIVE 02/08/2020   [place] NEGATIVE 01/04/2020   [place] NEGATIVE 07/17/2019   Radiology Studies: No results found.  Scheduled Meds: . (feeding supplement) PROSource Plus  30 mL Oral BID BM  . aspirin EC  81 mg Oral Daily  . atorvastatin  80 mg Oral QHS  . calcium carbonate  1 tablet Oral BID WC  . Chlorhexidine Gluconate Cloth  6 each Topical Daily  . collagenase   Topical Daily  . darbepoetin (ARANESP) injection - NON-DIALYSIS  40 mcg Subcutaneous Weekly  . feeding supplement (GLUCERNA SHAKE)  237 mL Oral Q24H  . heparin  5,000 Units Subcutaneous Q8H  . lactulose  20 g Oral Daily  . multivitamin with minerals  1 tablet Oral Daily  . nutrition supplement (JUVEN)  1 packet Oral BID BM  . potassium chloride  40 mEq Oral Daily  . saccharomyces boulardii  250 mg Oral BID   Continuous Infusions: . sodium chloride 40 mL/hr at 04/09/20 1057  . ertapenem 500 mg (04/10/20 1323)  . ferumoxytol 510 mg (04/08/20 1034)     LOS: 7 days   Time spent: [place], MD [facility] To contact the attending provider between 7A-7P or the covering provider during after hours 7P-7A, please log into the web site [email or web address] and access using universal [place] password for that  web site. If you do not have the password, please call the hospital operator.  04/10/2020, 2:20 PM

## 2020-04-11 ENCOUNTER — Inpatient Hospital Stay (HOSPITAL_COMMUNITY): Payer: Medicare Other

## 2020-04-11 DIAGNOSIS — X58XXXA Exposure to other specified factors, initial encounter: Secondary | ICD-10-CM | POA: Diagnosis present

## 2020-04-11 DIAGNOSIS — E43 Unspecified severe protein-calorie malnutrition: Secondary | ICD-10-CM | POA: Diagnosis present

## 2020-04-11 DIAGNOSIS — R601 Generalized edema: Secondary | ICD-10-CM | POA: Diagnosis not present

## 2020-04-11 DIAGNOSIS — S91301A Unspecified open wound, right foot, initial encounter: Secondary | ICD-10-CM | POA: Diagnosis not present

## 2020-04-11 DIAGNOSIS — R770 Abnormality of albumin: Secondary | ICD-10-CM | POA: Diagnosis not present

## 2020-04-11 DIAGNOSIS — R5381 Other malaise: Secondary | ICD-10-CM | POA: Diagnosis not present

## 2020-04-11 DIAGNOSIS — E785 Hyperlipidemia, unspecified: Secondary | ICD-10-CM | POA: Diagnosis not present

## 2020-04-11 DIAGNOSIS — E1169 Type 2 diabetes mellitus with other specified complication: Secondary | ICD-10-CM | POA: Diagnosis present

## 2020-04-11 DIAGNOSIS — Z20822 Contact with and (suspected) exposure to covid-19: Secondary | ICD-10-CM | POA: Diagnosis present

## 2020-04-11 DIAGNOSIS — M869 Osteomyelitis, unspecified: Secondary | ICD-10-CM | POA: Diagnosis present

## 2020-04-11 DIAGNOSIS — M86671 Other chronic osteomyelitis, right ankle and foot: Secondary | ICD-10-CM | POA: Diagnosis not present

## 2020-04-11 DIAGNOSIS — Z66 Do not resuscitate: Secondary | ICD-10-CM | POA: Diagnosis not present

## 2020-04-11 DIAGNOSIS — M7989 Other specified soft tissue disorders: Secondary | ICD-10-CM | POA: Diagnosis not present

## 2020-04-11 DIAGNOSIS — M2550 Pain in unspecified joint: Secondary | ICD-10-CM | POA: Diagnosis not present

## 2020-04-11 DIAGNOSIS — L89619 Pressure ulcer of right heel, unspecified stage: Secondary | ICD-10-CM | POA: Diagnosis not present

## 2020-04-11 DIAGNOSIS — L89159 Pressure ulcer of sacral region, unspecified stage: Secondary | ICD-10-CM | POA: Diagnosis not present

## 2020-04-11 DIAGNOSIS — R278 Other lack of coordination: Secondary | ICD-10-CM | POA: Diagnosis not present

## 2020-04-11 DIAGNOSIS — R41841 Cognitive communication deficit: Secondary | ICD-10-CM | POA: Diagnosis not present

## 2020-04-11 DIAGNOSIS — I517 Cardiomegaly: Secondary | ICD-10-CM | POA: Diagnosis not present

## 2020-04-11 DIAGNOSIS — N179 Acute kidney failure, unspecified: Secondary | ICD-10-CM | POA: Diagnosis not present

## 2020-04-11 DIAGNOSIS — I639 Cerebral infarction, unspecified: Secondary | ICD-10-CM | POA: Diagnosis not present

## 2020-04-11 DIAGNOSIS — Z952 Presence of prosthetic heart valve: Secondary | ICD-10-CM | POA: Diagnosis not present

## 2020-04-11 DIAGNOSIS — T83511A Infection and inflammatory reaction due to indwelling urethral catheter, initial encounter: Secondary | ICD-10-CM | POA: Diagnosis present

## 2020-04-11 DIAGNOSIS — Z452 Encounter for adjustment and management of vascular access device: Secondary | ICD-10-CM | POA: Diagnosis not present

## 2020-04-11 DIAGNOSIS — T8189XD Other complications of procedures, not elsewhere classified, subsequent encounter: Secondary | ICD-10-CM | POA: Diagnosis not present

## 2020-04-11 DIAGNOSIS — I442 Atrioventricular block, complete: Secondary | ICD-10-CM | POA: Diagnosis not present

## 2020-04-11 DIAGNOSIS — M6281 Muscle weakness (generalized): Secondary | ICD-10-CM | POA: Diagnosis not present

## 2020-04-11 DIAGNOSIS — Z7189 Other specified counseling: Secondary | ICD-10-CM | POA: Diagnosis not present

## 2020-04-11 DIAGNOSIS — T68XXXA Hypothermia, initial encounter: Secondary | ICD-10-CM | POA: Diagnosis not present

## 2020-04-11 DIAGNOSIS — I130 Hypertensive heart and chronic kidney disease with heart failure and stage 1 through stage 4 chronic kidney disease, or unspecified chronic kidney disease: Secondary | ICD-10-CM | POA: Diagnosis present

## 2020-04-11 DIAGNOSIS — L089 Local infection of the skin and subcutaneous tissue, unspecified: Secondary | ICD-10-CM | POA: Diagnosis not present

## 2020-04-11 DIAGNOSIS — T148XXA Other injury of unspecified body region, initial encounter: Secondary | ICD-10-CM | POA: Diagnosis not present

## 2020-04-11 DIAGNOSIS — Z853 Personal history of malignant neoplasm of breast: Secondary | ICD-10-CM | POA: Diagnosis not present

## 2020-04-11 DIAGNOSIS — F329 Major depressive disorder, single episode, unspecified: Secondary | ICD-10-CM | POA: Diagnosis not present

## 2020-04-11 DIAGNOSIS — G928 Other toxic encephalopathy: Secondary | ICD-10-CM | POA: Diagnosis present

## 2020-04-11 DIAGNOSIS — D649 Anemia, unspecified: Secondary | ICD-10-CM | POA: Diagnosis not present

## 2020-04-11 DIAGNOSIS — N1831 Chronic kidney disease, stage 3a: Secondary | ICD-10-CM | POA: Diagnosis present

## 2020-04-11 DIAGNOSIS — R6520 Severe sepsis without septic shock: Secondary | ICD-10-CM | POA: Diagnosis not present

## 2020-04-11 DIAGNOSIS — E11649 Type 2 diabetes mellitus with hypoglycemia without coma: Secondary | ICD-10-CM | POA: Diagnosis present

## 2020-04-11 DIAGNOSIS — I5033 Acute on chronic diastolic (congestive) heart failure: Secondary | ICD-10-CM | POA: Diagnosis not present

## 2020-04-11 DIAGNOSIS — E871 Hypo-osmolality and hyponatremia: Secondary | ICD-10-CM | POA: Diagnosis not present

## 2020-04-11 DIAGNOSIS — R531 Weakness: Secondary | ICD-10-CM | POA: Diagnosis not present

## 2020-04-11 DIAGNOSIS — F29 Unspecified psychosis not due to a substance or known physiological condition: Secondary | ICD-10-CM | POA: Diagnosis not present

## 2020-04-11 DIAGNOSIS — G9341 Metabolic encephalopathy: Secondary | ICD-10-CM | POA: Diagnosis not present

## 2020-04-11 DIAGNOSIS — Z8616 Personal history of COVID-19: Secondary | ICD-10-CM | POA: Diagnosis not present

## 2020-04-11 DIAGNOSIS — I5032 Chronic diastolic (congestive) heart failure: Secondary | ICD-10-CM | POA: Diagnosis present

## 2020-04-11 DIAGNOSIS — D696 Thrombocytopenia, unspecified: Secondary | ICD-10-CM | POA: Diagnosis present

## 2020-04-11 DIAGNOSIS — R4182 Altered mental status, unspecified: Secondary | ICD-10-CM | POA: Diagnosis not present

## 2020-04-11 DIAGNOSIS — L89614 Pressure ulcer of right heel, stage 4: Secondary | ICD-10-CM | POA: Diagnosis present

## 2020-04-11 DIAGNOSIS — I5030 Unspecified diastolic (congestive) heart failure: Secondary | ICD-10-CM | POA: Diagnosis not present

## 2020-04-11 DIAGNOSIS — Y846 Urinary catheterization as the cause of abnormal reaction of the patient, or of later complication, without mention of misadventure at the time of the procedure: Secondary | ICD-10-CM | POA: Diagnosis present

## 2020-04-11 DIAGNOSIS — G9340 Encephalopathy, unspecified: Secondary | ICD-10-CM | POA: Diagnosis not present

## 2020-04-11 DIAGNOSIS — I10 Essential (primary) hypertension: Secondary | ICD-10-CM | POA: Diagnosis not present

## 2020-04-11 DIAGNOSIS — M62262 Nontraumatic ischemic infarction of muscle, left lower leg: Secondary | ICD-10-CM | POA: Diagnosis not present

## 2020-04-11 DIAGNOSIS — E162 Hypoglycemia, unspecified: Secondary | ICD-10-CM | POA: Diagnosis not present

## 2020-04-11 DIAGNOSIS — Z794 Long term (current) use of insulin: Secondary | ICD-10-CM | POA: Diagnosis not present

## 2020-04-11 DIAGNOSIS — L03115 Cellulitis of right lower limb: Secondary | ICD-10-CM | POA: Diagnosis present

## 2020-04-11 DIAGNOSIS — J9811 Atelectasis: Secondary | ICD-10-CM | POA: Diagnosis not present

## 2020-04-11 DIAGNOSIS — T8189XA Other complications of procedures, not elsewhere classified, initial encounter: Secondary | ICD-10-CM | POA: Diagnosis not present

## 2020-04-11 DIAGNOSIS — L89150 Pressure ulcer of sacral region, unstageable: Secondary | ICD-10-CM | POA: Diagnosis not present

## 2020-04-11 DIAGNOSIS — N183 Chronic kidney disease, stage 3 unspecified: Secondary | ICD-10-CM | POA: Diagnosis not present

## 2020-04-11 DIAGNOSIS — D638 Anemia in other chronic diseases classified elsewhere: Secondary | ICD-10-CM | POA: Diagnosis present

## 2020-04-11 DIAGNOSIS — R509 Fever, unspecified: Secondary | ICD-10-CM | POA: Diagnosis not present

## 2020-04-11 DIAGNOSIS — L89613 Pressure ulcer of right heel, stage 3: Secondary | ICD-10-CM | POA: Diagnosis not present

## 2020-04-11 DIAGNOSIS — Z515 Encounter for palliative care: Secondary | ICD-10-CM | POA: Diagnosis not present

## 2020-04-11 DIAGNOSIS — Z6831 Body mass index (BMI) 31.0-31.9, adult: Secondary | ICD-10-CM | POA: Diagnosis not present

## 2020-04-11 DIAGNOSIS — R627 Adult failure to thrive: Secondary | ICD-10-CM | POA: Diagnosis not present

## 2020-04-11 DIAGNOSIS — D509 Iron deficiency anemia, unspecified: Secondary | ICD-10-CM | POA: Diagnosis not present

## 2020-04-11 DIAGNOSIS — E119 Type 2 diabetes mellitus without complications: Secondary | ICD-10-CM | POA: Diagnosis not present

## 2020-04-11 DIAGNOSIS — E1140 Type 2 diabetes mellitus with diabetic neuropathy, unspecified: Secondary | ICD-10-CM | POA: Diagnosis not present

## 2020-04-11 DIAGNOSIS — E1151 Type 2 diabetes mellitus with diabetic peripheral angiopathy without gangrene: Secondary | ICD-10-CM | POA: Diagnosis present

## 2020-04-11 DIAGNOSIS — R2243 Localized swelling, mass and lump, lower limb, bilateral: Secondary | ICD-10-CM | POA: Diagnosis not present

## 2020-04-11 DIAGNOSIS — Z4659 Encounter for fitting and adjustment of other gastrointestinal appliance and device: Secondary | ICD-10-CM | POA: Diagnosis not present

## 2020-04-11 DIAGNOSIS — N390 Urinary tract infection, site not specified: Secondary | ICD-10-CM | POA: Diagnosis present

## 2020-04-11 DIAGNOSIS — L89154 Pressure ulcer of sacral region, stage 4: Secondary | ICD-10-CM | POA: Diagnosis present

## 2020-04-11 DIAGNOSIS — Z7401 Bed confinement status: Secondary | ICD-10-CM | POA: Diagnosis not present

## 2020-04-11 DIAGNOSIS — A419 Sepsis, unspecified organism: Secondary | ICD-10-CM | POA: Diagnosis present

## 2020-04-11 DIAGNOSIS — E8809 Other disorders of plasma-protein metabolism, not elsewhere classified: Secondary | ICD-10-CM | POA: Diagnosis not present

## 2020-04-11 DIAGNOSIS — R1311 Dysphagia, oral phase: Secondary | ICD-10-CM | POA: Diagnosis not present

## 2020-04-11 DIAGNOSIS — Z531 Procedure and treatment not carried out because of patient's decision for reasons of belief and group pressure: Secondary | ICD-10-CM | POA: Diagnosis not present

## 2020-04-11 DIAGNOSIS — L89153 Pressure ulcer of sacral region, stage 3: Secondary | ICD-10-CM | POA: Diagnosis not present

## 2020-04-11 DIAGNOSIS — E1122 Type 2 diabetes mellitus with diabetic chronic kidney disease: Secondary | ICD-10-CM | POA: Diagnosis not present

## 2020-04-11 DIAGNOSIS — M86171 Other acute osteomyelitis, right ankle and foot: Secondary | ICD-10-CM | POA: Diagnosis not present

## 2020-04-11 DIAGNOSIS — M1990 Unspecified osteoarthritis, unspecified site: Secondary | ICD-10-CM | POA: Diagnosis not present

## 2020-04-11 HISTORY — PX: IR US GUIDE VASC ACCESS RIGHT: IMG2390

## 2020-04-11 HISTORY — PX: IR FLUORO GUIDE CV LINE RIGHT: IMG2283

## 2020-04-11 LAB — COMPREHENSIVE METABOLIC PANEL
ALT: 21 U/L (ref 0–44)
AST: 26 U/L (ref 15–41)
Albumin: 1.4 g/dL — ABNORMAL LOW (ref 3.5–5.0)
Alkaline Phosphatase: 124 U/L (ref 38–126)
Anion gap: 7 (ref 5–15)
BUN: 78 mg/dL — ABNORMAL HIGH (ref 8–23)
CO2: 16 mmol/L — ABNORMAL LOW (ref 22–32)
Calcium: 8.5 mg/dL — ABNORMAL LOW (ref 8.9–10.3)
Chloride: 119 mmol/L — ABNORMAL HIGH (ref 98–111)
Creatinine, Ser: 1.66 mg/dL — ABNORMAL HIGH (ref 0.44–1.00)
GFR, Estimated: 32 mL/min — ABNORMAL LOW (ref >60)
Glucose, Bld: 92 mg/dL (ref 70–99)
Potassium: 5.3 mmol/L — ABNORMAL HIGH (ref 3.5–5.1)
Sodium: 142 mmol/L (ref 135–145)
Total Bilirubin: 0.5 mg/dL (ref 0.3–1.2)
Total Protein: 4.9 g/dL — ABNORMAL LOW (ref 6.5–8.1)

## 2020-04-11 LAB — CBC WITH DIFFERENTIAL/PLATELET
Abs Immature Granulocytes: 0.45 10*3/uL — ABNORMAL HIGH (ref 0.00–0.07)
Basophils Absolute: 0.1 10*3/uL (ref 0.0–0.1)
Basophils Relative: 0 %
Eosinophils Absolute: 0.4 10*3/uL (ref 0.0–0.5)
Eosinophils Relative: 3 %
HCT: 25.1 % — ABNORMAL LOW (ref 36.0–46.0)
Hemoglobin: 7.5 g/dL — ABNORMAL LOW (ref 12.0–15.0)
Immature Granulocytes: 3 %
Lymphocytes Relative: 13 %
Lymphs Abs: 1.8 10*3/uL (ref 0.7–4.0)
MCH: 30.1 pg (ref 26.0–34.0)
MCHC: 29.9 g/dL — ABNORMAL LOW (ref 30.0–36.0)
MCV: 100.8 fL — ABNORMAL HIGH (ref 80.0–100.0)
Monocytes Absolute: 1 10*3/uL (ref 0.1–1.0)
Monocytes Relative: 7 %
Neutro Abs: 10 10*3/uL — ABNORMAL HIGH (ref 1.7–7.7)
Neutrophils Relative %: 74 %
Platelets: 290 10*3/uL (ref 150–400)
RBC: 2.49 MIL/uL — ABNORMAL LOW (ref 3.87–5.11)
RDW: 18.6 % — ABNORMAL HIGH (ref 11.5–15.5)
WBC: 13.7 10*3/uL — ABNORMAL HIGH (ref 4.0–10.5)
nRBC: 0.5 % — ABNORMAL HIGH (ref 0.0–0.2)

## 2020-04-11 LAB — GLUCOSE, CAPILLARY
Glucose-Capillary: 97 mg/dL (ref 70–99)
Glucose-Capillary: 67 mg/dL — ABNORMAL LOW (ref 70–99)
Glucose-Capillary: 65 mg/dL — ABNORMAL LOW (ref 70–99)
Glucose-Capillary: 79 mg/dL (ref 70–99)
Glucose-Capillary: 109 mg/dL — ABNORMAL HIGH (ref 70–99)
Glucose-Capillary: 106 mg/dL — ABNORMAL HIGH (ref 70–99)

## 2020-04-11 LAB — SARS CORONAVIRUS 2 (TAT 6-24 HRS): SARS Coronavirus 2: NEGATIVE

## 2020-04-11 MED ORDER — DARBEPOETIN ALFA 40 MCG/0.4ML IJ SOSY: 40.0000 ug | PREFILLED_SYRINGE | INTRAMUSCULAR | 0 refills | Status: DC

## 2020-04-11 MED ORDER — GLUCOSE 40 % PO GEL
ORAL | Status: AC
  Administered 2020-04-11: 37.5 g
  Filled 2020-04-11: qty 1

## 2020-04-11 MED ORDER — LIDOCAINE HCL 1 % IJ SOLN
INTRAMUSCULAR | Status: AC | PRN
  Administered 2020-04-11: 5 mL

## 2020-04-11 MED ORDER — LIDOCAINE HCL 1 % IJ SOLN
INTRAMUSCULAR | Status: AC
  Filled 2020-04-11: qty 20

## 2020-04-11 NOTE — Procedures (Signed)
Interventional Radiology Procedure Note  Procedure: Right IJ Powerline  Indication: Osteomyelitis  Findings: Please refer to procedural dictation for full description.  Complications: None  EBL: < 10 mL  Miachel Roux, MD [phone]

## 2020-04-11 NOTE — Progress Notes (Signed)
Patient alert and in no distress. Patient transported to the facility via ambulance, discharge packet given to [place] for facility, hydrotherapy and dressing change done on sacral ulcer.

## 2020-04-11 NOTE — Progress Notes (Signed)
Report given to Manuela Schwartz the Doctor, hospital at [facility]. Daughter at the bedside updated on discharge. Waiting on [facility] for transportation

## 2020-04-11 NOTE — Progress Notes (Signed)
Hypoglycemic Event  CBG:67  Treatment: 4 oz juice/soda  Symptoms: None  Follow-up CBG: Time: 0802 CBG Result:65  Possible Reasons for Event: Inadequate meal intake  Comments/MD notified:Dr. Anner Crete, Larence Penning

## 2020-04-11 NOTE — Progress Notes (Signed)
04/11/20  1115 Hydrotherapy treatment   Subjective Assessment  Subjective Pt sleeping most of session. Barely speaks.  Patient and Family Stated Goals Pt unable today  Date of Onset  (present on admission from SNF)  Prior Treatments unsure.  Pressure Injury 04/04/20 Sacrum Medial Unstageable - Full thickness tissue loss in which the base of the injury is covered by slough (yellow, tan, gray, green or brown) and/or eschar (tan, brown or black) in the wound bed. Physical therapy only- sacral   Date First Assessed/Time First Assessed: 04/04/20 1030 Location: Sacrum Location Orientation: Medial Staging: Unstageable - Full thickness tissue loss in which the base of the injury is covered by slough (yellow, tan, gray, green or brown)   Dressing Type ABD;Gauze (Comment);Moist to dry;Santyl;Silicone dressing (silicone drssing to inferior border of peri wound and at right gluteal fold to cover macerated areas.  Dressing Changed  Dressing Change Frequency Twice a day  State of Healing Eschar  Site / Wound Assessment ;Black;Yellow  % Wound base Red or Granulating 0%  % Wound base Yellow/Fibrinous Exudate 10%  % Wound base Black/Eschar 90%  Peri-wound Assessment Erythema (non-blanchable), friable/excoriated at gluteal cleft/binferior border, placed mepilex  Wound Length (cm) 6 cm  Wound Width (cm) 7 cm  Wound Depth (cm) undermine 2.5 cm 3cm @ 4:00, 2cm from 1:00 -4:00. 2.5 @ 12:00  Wound Surface Area (cm^2) 42 cm^2  Wound Volume (cm^3) 42 cm^3  Margins Unattached edges (unapproximated)  Drainage Amount Mod   Drainage Description Odor,   Treatment Debridement (Selective);Hydrotherapy (Pulse lavage);Packing (Saline gauze)  Hydrotherapy  Pulsed Lavage with Suction (psi) 12 psi  Pulsed Lavage with Suction - Normal Saline Used 1000 mL  Pulsed Lavage Tip Tip with splash shield  Pulsed lavage therapy - wound location sacral  Selective Debridement  Selective Debridement - Location sacral   Selective Debridement - Tools Used Forceps;Scissors;  Selective Debridement - Tissue Removed moderate amount of black eschar, minimal slough  Wound Therapy - Assess/Plan/Recommendations  Wound Therapy - Clinical Statement Patient  Somnolent ,barely audible responses when asked about pain if ready to eat. Winces when turned stating "my low back". Patient had PICC placed by IR today. Patient premedicated  With Tylenol..  Wound remains with  Slough and eschar, very little granulation with very little changes in wound appearance. Sacral bone palpable.  Continue PLS , selective sharp debridement.  Did not note any greenish tinge today on  old dressing  Dated 2/15 @ 2300.   Wound Therapy - Functional Problem List bed bound, total care  Factors Delaying/Impairing Wound Healing Diabetes Mellitus;Incontinence  Hydrotherapy Plan Debridement;Dressing change;Patient/family education;Pulsatile lavage with suction  Wound Therapy - Frequency 6X / week  Wound Therapy - Current Recommendations Case manager/social work  Wound Therapy - Follow Up Recommendations Skilled nursing facility  Wound Plan Continue with PLS, conservative sharp debridement and dressing changes to decrease bioburden and necrotic tissue.  Wound Therapy Goals - Improve the function of patient's integumentary system by progressing the wound(s) through the phases of wound healing by:  Decrease Necrotic Tissue to 50  Decrease Necrotic Tissue - Progress Progressing toward goal  Increase Granulation Tissue to 50  Increase Granulation Tissue - Progress Progressing toward goal  Patient/Family will be able to  direct pressure relief from sacrum  Patient/Family Instruction Goal - Progress Progressing toward goal  Goals/treatment plan/discharge plan were made with and agreed upon by patient/family Yes  Time For Goal Achievement 2 weeks  Wound Therapy - Potential for Goals Fair  Santiago Glad  [place] Pager  [phone] Office [phone] Time [phone]

## 2020-04-11 NOTE — Progress Notes (Signed)
[facility] ([facility])  Hospital Liaison RN note         Notified by TOC manager of patient/family request for [facility] Palliative services at home after discharge.              [facility] Palliative team will follow up with patient after discharge.         Please call with any hospice or palliative related questions.         Thank you for the opportunity to participate in this patient's care.     Chrislyn King, BSN, RN [facility] Hospital Liaison (listed on [facility] under Hospice/[facility])    [phone] (24h on call)    

## 2020-04-11 NOTE — Progress Notes (Signed)
MEDICATION-RELATED CONSULT NOTE   IR Procedure Consult - Anticoagulant/Antiplatelet PTA/Inpatient Med List Review by Pharmacist    Procedure: PICC placement    Completed: 2/16 @ 0915  Post-Procedural bleeding risk per IR MD assessment: LOW  Antithrombotic medications on inpatient profile prior to procedure:  SQ heparin 5000 units TID, ASA 81 mg daily PTA antithrombotic medications:  Plavix 75 mg daily    Recommended restart time per IR Post-Procedure Guidelines:  ASA not held; SQ heparin to restart at least 4 hr afterprocedure   Other considerations:  Per d/c summary, planning to hold Plavix at discharge until Hgb stabilizes   Plan:     OK to resume heparin at 1400 (if not yet discharged)  Reuel Boom, PharmD, BCPS [phone] 04/11/2020, 1:10 PM

## 2020-04-11 NOTE — Discharge Summary (Addendum)
Physician Discharge Summary [facility]    Patient: [name]                   Admit date: 04/03/2020   DOB: 07/19/1945             Discharge date:04/11/2020/3:20 PM WLN:[id number]                          PCP: Hendricks Limes, MD  Disposition:  SNF  Recommendations for Outpatient Follow-up:   Follow up: Recommended to follow with the physician within 1 to 3 days. Discussed with daughter in detail given the comorbidities that prognosis is poor, will not advocating for tube feeds, or PEG tube placement.  We highly recommend palliative consultation and follow-up    Discharge Condition: Stable  Code Status:   Code Status: Full Code Diet recommendation: Diabetic diet   Discharge Diagnoses:    Active Problems:   DM (diabetes mellitus) ([place])   Depression   Hyperlipidemia   Refusal of blood transfusions as patient is Jehovah's Witness   History of adenocarcinoma of breast   Status post CVA   Aortic stenosis s/p Tissure AVR 2006   CAD (coronary artery disease)   Hypertension   Aortic atherosclerosis (HCC)   Chronic diastolic congestive heart failure (HCC)   Sacral decubitus ulcer, stage II (HCC)   CKD (chronic kidney disease) stage 3, GFR 30-59 ml/min (HCC)   Osteomyelitis of toe (HCC)   Open wound of heel   History of Present Illness/ Hospital Course [name] Summary:   [name] is a 75 year old female skilled living facility resident-wheelchair-bound at baseline Jehovah's Witness-does not take transfusions critical right lower extremity ischemia status post angiography recanalization A NT tibial artery-balloon angioplasty on aspirin and Plavix Aortic stenosis status post TAVR 03/01/2019 for, aortic valve repair 2006 HFpEF + CHB + PPM (placed 01/2019)-bacteremia group B strep 06/2019 status post PPM explantation and then subsequent Medtronic Micra AV Prior coronavirus infection 06/2018 DM TY 2 CKD stage III Prior CVA Breast cancer status  postmastectomy and chemotherapy  Presented from [facility] has been treated in the outpatient setting for osteomyelitis of the right great toe with doxycycline--on follow-up evaluation found to have acute kidney injury and was started on saline in the outpatient setting-found to have sacral decubitus and right heel ulcer Culture showed right heel ulcer was positive for Morganella and had been changed to cefdinir  Found to have mild hypotension white count elevated to 19.8 General surgery saw the patient for sacral ulcer and did bedside debridement during hospital stay Podiatry saw the patient in addition-patient was placed on Invanz CT scan was ordered Eventually underwent debridement first proximal phalanx-became slightly hypotensive but has stabilized     Active Problems:   Open wound of heel  1. Osteomyelitis of right heel-failed outpatient therapy on multiple agents 2. Grew Morganella in the outpatient setting a. CT scan 2/8 reveals first proximal distal phalangeal osteomyelitis underwent 2/11 debridement b. White count continues to decrease-ID rec's invanz thru 05/18/2020--Placing tunneled PICC line  3. Perioperative hypotension a. Remains somewhat hypotensive b. Fluids cut back-monitor trends 4. Sacral decubitus ulcer a. General surgery recommends hydrotherapy as per PT-Sharp debridement performed by general surgery at bedside 2/10 b. Santyl as needed c. Pain management with as needed ibuprofen stop all opiates as below 5. Slight confusion?  Metabolic encephalopathy a. Unsure of lack of sleep versus medication effect b. Discontinue opiates, Cymbalta, melatonin c. Given  Narcan x1 with good effect on 2/14 6. Normocytic anemia-Jehovah's Witness a. Holding Plavix at this time b. Hemoglobin was low iron levels are low-give Feraheme 2/13 and Aranesp 7. HOCM + bradycardia a. Metoprolol and hydralazine both on hold 8. AoS status post TAVR a. Will continue Plavix and  aspirin b. Toprol-XL 25 was held given perioperative hypotension--resolved, continue Toprol-XL 9. AKI on admission superimposed on CKD 3B -  a. Kidney function is slowly improving she has a mild metabolic acidosis in addition 10. Hypokalemia hypocalcemia a. Continue K. Dur 40 daily  b. calcium carbonate 500 twice daily 11. Acute urinary retention a. Retained urine on 2/10 therefore Foley placed 2/11 b. Needs outpatient monitor c. Voiding trial failed will continue Foley catheter in the outpatient  12. DM TY 2 with neuropathy a. holding gabapentin 100 at bedtime, stop combination insulin liraglutide b. Sliding scale coverage discontinued as mild hypoglycemia and not eating much 13. Depression a. D/Ced Remeron 7.5 at bedtime,  b. Cont.Cymbalta 30 daily  H/o CVA  It looks like after careful chart review patient had a perioperative stroke after valve repair which may be why patient was started on Plavix Would hold Plavix for the time being and as an outpatient if hemoglobin is above 8 consistently can continue  Central line was placed by interventional radiologist 04/11/2008 -for prolonged antibiotic use    Code Status: Full Family Communication:  Updated at the bedside [name] [phone] On 04/11/2018 Discussed in detail regarding long-term care, recommending palliative care. Based on our assessment, prognosis is poor. Daughter expressed understanding and agreement with current plan.  Disposition:  Status is: Inpatient   Dispo: The patient is from: SNF Plan to discharge to [facility] 04/11/2020     Consultants:   Podiatry  General surgery  Procedures: None yet  Antimicrobials: Invanz  Nutritional status:  Nutrition Problem: Increased nutrient needs Etiology: acute illness,wound healing Signs/Symptoms: estimated needs Interventions: Glucerna shake,Juven,Prostat  The patient's BMI is: Body mass index is 22.1 kg/m. I agree with the assessment and  plan as outlined below: Nutrition Status: Nutrition Problem: Increased nutrient needs Etiology: acute illness,wound healing Signs/Symptoms: estimated needs Interventions: Glucerna shake,Juven,Prostat     SKIN assessment: I agree with skin assessment and plan as outlined below: Pressure Injury 04/04/20 (Active)  04/04/20 1030  Location:   Location Orientation:   Staging:   Wound Description (Comments):   Present on Admission:      Pressure Injury 04/04/20 Sacrum Medial Unstageable - Full thickness tissue loss in which the base of the injury is covered by slough (yellow, tan, gray, green or brown) and/or eschar (tan, brown or black) in the wound bed. Physical therapy only-  sacral  (Active)  04/04/20 1030  Location: Sacrum  Location Orientation: Medial  Staging: Unstageable - Full thickness tissue loss in which the base of the injury is covered by slough (yellow, tan, gray, green or brown) and/or eschar (tan, brown or black) in the wound bed.  Wound Description (Comments): Physical therapy only-  sacral wound covered with dark soft eschar, malodorous  Present on Admission: Yes    Risk of unplanned readmission Score:     Discharge Instructions:   Discharge Instructions    Activity as tolerated - No restrictions   Complete by: As directed    [facility] pharmacist to adjust dose for Vancomycin, Aminoglycosides and other anti-infective therapies as requested by physician.   Complete by: As directed    [facility] to provide Cath Flo 54m  Complete by: As directed    Administer for PICC line occlusion and as ordered by physician for other access device issues.   Anaphylaxis Kit: Provided to treat any anaphylactic reaction to the medication being provided to the patient if First Dose or when requested by physician   Complete by: As directed    Epinephrine 41m/ml vial / amp: Administer 0.354m(0.53m66msubcutaneously once for moderate to severe anaphylaxis,  nurse to call physician and pharmacy when reaction occurs and call 911 if needed for immediate care   Diphenhydramine 67m20m IV vial: Administer 25-67mg17mIM PRN for first dose reaction, rash, itching, mild reaction, nurse to call physician and pharmacy when reaction occurs   Sodium Chloride 0.9% NS 500ml 66mAdminister if needed for hypovolemic blood pressure drop or as ordered by physician after call to physician with anaphylactic reaction   Change dressing on IV access line weekly and PRN   Complete by: As directed    Diet - low sodium heart healthy   Complete by: As directed    Discharge instructions   Complete by: As directed    F/up With ID in 2 wks   Discharge wound care:   Complete by: As directed    Per instructions provided by wound care team, RN Recommending wound care consult, daily wound care as recommended   Flush IV access with Sodium Chloride 0.9% and Heparin 10 units/ml or 100 units/ml   Complete by: As directed    Home infusion instructions - [facility]   Complete by: As directed    Instructions: Flush IV access with Sodium Chloride 0.9% and Heparin 10units/ml or 100units/ml   Change dressing on IV access line: Weekly and PRN   Instructions Cath Flo 2mg: A57mnister for PICC Line occlusion and as ordered by physician for other access device   [facility] pharmacist to adjust dose for: Vancomycin, Aminoglycosides and other anti-infective therapies as requested by physician   Increase activity slowly   Complete by: As directed    Method of administration may be changed at the discretion of home infusion pharmacist based upon assessment of the patient and/or caregiver's ability to self-administer the medication ordered   Complete by: As directed        Medication List    STOP taking these medications   amoxicillin 500 MG capsule Commonly known as: AMOXIL   cefdinir 300 MG capsule Commonly known as: OMNICEF   cefTRIAXone 1 g  injection Commonly known as: ROCEPHIN   gabapentin 100 MG capsule Commonly known as: NEURONTIN   hydrALAZINE 25 MG tablet Commonly known as: APRESOLINE   magnesium hydroxide 400 MG/5ML suspension Commonly known as: MILK OF MAGNESIA   metoprolol succinate 25 MG 24 hr tablet Commonly known as: TOPROL-XL   mirtazapine 15 MG tablet Commonly known as: REMERON   saccharomyces boulardii 250 MG capsule Commonly known as: FLORASTOR     TAKE these medications   acetaminophen 325 MG tablet Commonly known as: TYLENOL Take 650 mg by mouth daily. Not to exceed 3000 mg in 24 hour per stranding order   aspirin 81 MG EC tablet TAKE 1 TABLET (81 MG TOTAL) BY MOUTH DAILY. SWALLOW WHOLE. What changed:   how much to take  how to take this  when to take this  additional instructions   atorvastatin 80 MG tablet Commonly known as: LIPITOR Take 1 tablet (80 mg total) by mouth at bedtime. Notes to patient: 04/12/20   clopidogrel 75 MG tablet Commonly known as:  PLAVIX Take 1 tablet (75 mg total) by mouth daily with breakfast. What changed: when to take this   Darbepoetin Alfa 40 MCG/0.4ML Sosy injection Commonly known as: ARANESP Inject 0.4 mLs (40 mcg total) into the skin once a week. Start taking on: April 15, 2020   Decubi-Vite Caps Take 1 capsule by mouth daily.   diclofenac Sodium 1 % Gel Commonly known as: VOLTAREN Apply 4 g topically 4 (four) times daily as needed (Pain). Apply to bilateral hips and knees   DULoxetine 30 MG capsule Commonly known as: CYMBALTA Take 30 mg by mouth daily.   Ensure Take 237 mLs by mouth in the morning and at bedtime. Strawberry if available What changed: Another medication with the same name was removed. Continue taking this medication, and follow the directions you see here.   ertapenem 500 mg in sodium chloride 0.9 % 50 mL Inject 500 mg into the vein daily. Start taking on: April 12, 2020   feeding supplement (PRO-STAT SUGAR  FREE 64) Liqd Take 30 mLs by mouth in the morning and at bedtime.   ferumoxytol 510 mg in sodium chloride 0.9 % 100 mL Inject 510 mg into the vein once a week. Start taking on: April 15, 2020   lactulose 10 GM/15ML solution Commonly known as: CHRONULAC Take 20 g by mouth daily.   melatonin 3 MG Tabs tablet Take 6 mg by mouth every evening.   metoprolol tartrate 25 MG tablet Commonly known as: LOPRESSOR Take 12.5 mg by mouth 2 (two) times daily.   NovoLOG FlexPen 100 UNIT/ML FlexPen Generic drug: insulin aspart Inject 5 Units into the skin 2 (two) times daily. cbg >/= 150   ondansetron 4 MG tablet Commonly known as: ZOFRAN Take 4 mg by mouth every 12 (twelve) hours as needed for nausea or vomiting.   white petrolatum Gel Commonly known as: VASELINE Apply 1 application topically daily. Heels at bedtime   Xultophy 100-3.6 UNIT-MG/ML Sopn Generic drug: Insulin Degludec-Liraglutide Inject 10 Units into the skin daily.       Contact information for after-discharge care    [facility] .   Service: Skilled Nursing Contact information: [place] [phone]                 Allergies  Allergen Reactions  . Doxycycline     Made her stopped eating   . 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," as the patient is a Jehovah's Witness     Procedures /Studies:   DG Sacrum/Coccyx  Result Date: 04/03/2020 CLINICAL DATA:  Sacral wound EXAM: SACRUM AND COCCYX - 2+ VIEW COMPARISON:  October 06, 2005 FINDINGS: Frontal, angled frontal, and lateral views were obtained. There is no fracture or diastasis. No erosive change or bony destruction evident. There is osteoarthritic change in each sacroiliac joint. No soft tissue air evident. IMPRESSION: No bony destruction. No fracture or diastasis. Osteoarthritic change in each sacroiliac joint noted. Electronically Signed   By: Lowella Grip III M.D.   On: 04/03/2020 14:36   CT FOOT RIGHT WO CONTRAST  Result Date: 04/03/2020 CLINICAL DATA:  Concern for osteomyelitis, abnormal radiographs EXAM: CT OF THE RIGHT FOOT WITHOUT CONTRAST TECHNIQUE: Multidetector CT imaging of the right foot was performed according to the standard protocol. Multiplanar CT image reconstructions were also generated. COMPARISON:  Radiographs 02/08/2020, 04/03/2020 FINDINGS: Bones/Joint/Cartilage The osseous structures appear diffusely demineralized. There conspicuous transcortical lucencies involving  the first distal phalanx as well as the head and base of the first proximal phalanx. Some more chronic resorptive changes of distal phalanx could reflect sequela prior infection as well. Soft tissue ulceration is noted at the level of the posterior calcaneus some slight cortical irregularity and possible early periostitis features which could reflect sequela of acute or chronic osteomyelitis. Hammertoe deformities are noted at the second through fourth rays including inferior subluxation across the second PIP with likely fracture cross the proximal phalangeal head. Sclerotic and irregular changes at the hallucal sesamoidal complexes could reflect sequela of prior sesamoiditis. Note is made of a significant hallux valgus deformity of the first ray as well. Additional degenerative changes noted throughout foot. No acute acute fracture or traumatic malalignment. Additional degenerative changes noted in the ankle. Ligaments Suboptimally assessed by CT. Muscles and Tendons Diffuse muscular atrophy of the foot. No clearly retracted or torn tendons are seen. Extensive soft tissue stranding is noted along the dorsal and plantar tendon sheaths particularly of the forefoot. Soft tissues Extensive soft tissue swelling of the foot and lower extremity edema. Focal ulceration at the tip of the first digit and along the posterior calcaneus, as described above. No sizable ankle joint  effusion. No other organized fluid collection is evident. Extensive vascular calcium. IMPRESSION: 1. Extensive soft tissue swelling thickening of the foot most pronounced about the first ray and posterior to the calcaneus with there is focal soft tissue. Subjacent areas of cortical irregularity are seen involving the first proximal and distal phalanges concerning for osteomyelitis. Some additional cortical irregularity and periostitis of the calcaneus could reflect an additional site of acute or chronic osteomyelitis. MR imaging, ideally with contrast if tolerable, is a more sensitive and specific modality further determination of osteomyelitis. 2. Additional chronic resorptive changes of the distal phalanx could reflect sequela of more remote small during chronic osteomyelitis as well. 3. Likely posttraumatic deformity of the head of the second proximal phalanx with volar subluxation across the second PIP. 4. Hammertoe deformities at the second through fourth rays. 5. Sclerotic and irregular changes at the hallucal sesamoidal complexes could reflect sequela of prior sesamoiditis. 6. Hallux valgus deformity and bunion formation of foot. 7. Extensive soft tissue swelling of the foot and lower extremity edema. Electronically Signed   By: Lovena Le M.D.   On: 04/03/2020 22:49   IR Fluoro Guide CV Line Right  Result Date: 04/11/2020 INDICATION: Osteomyelitis requiring long-term antibiotics. EXAM: Tunneled right IJ central venous catheter MEDICATIONS: None ANESTHESIA/SEDATION: None FLUOROSCOPY TIME:  Fluoroscopy Time: 0 minutes 18 seconds (2 mGy). COMPLICATIONS: None immediate. PROCEDURE: Informed written consent was obtained from the patient after a thorough discussion of the procedural risks, benefits and alternatives. All questions were addressed. Maximal Sterile Barrier Technique was utilized including caps, mask, sterile gowns, sterile gloves, sterile drape, hand hygiene and skin antiseptic. A timeout was  performed prior to the initiation of the procedure. Patient positioned supine on the angiography table. Right neck and anterior upper chest prepped and draped in the usual sterile fashion. The right internal jugular vein was evaluated with ultrasound and shown to be patent. A permanent ultrasound image was obtained and placed in the patient's medical record. Using sterile gel and a sterile probe cover, the right internal jugular vein was entered with a 21 ga needle during real time ultrasound guidance. 0.018 inch guidewire advanced to the IVC under fluoroscopic guidance. Peel-away sheath placed. Attention then turned to the right anterior upper chest. Following local lidocaine administration, the  catheter was tunneled from the chest wall to the venotomy site. The catheter was inserted through the peel-away sheath. The tip of the catheter was positioned at the cavoatrial junction. All lumens of the catheter aspirated and flushed well. The catheter was secured to the skin with suture. Skin overlying the right neck venotomy site was sealed with Dermabond. The insertion site was covered with a Biopatch and sterile dressing. IMPRESSION: Dual lumen right IJ tunneled PICC ready for use. Electronically Signed   By: Miachel Roux M.D.   On: 04/11/2020 10:05   IR US Guide Vasc Access Right  Result Date: 04/11/2020 INDICATION: Osteomyelitis requiring long-term antibiotics. EXAM: Tunneled right IJ central venous catheter MEDICATIONS: None ANESTHESIA/SEDATION: None FLUOROSCOPY TIME:  Fluoroscopy Time: 0 minutes 18 seconds (2 mGy). COMPLICATIONS: None immediate. PROCEDURE: Informed written consent was obtained from the patient after a thorough discussion of the procedural risks, benefits and alternatives. All questions were addressed. Maximal Sterile Barrier Technique was utilized including caps, mask, sterile gowns, sterile gloves, sterile drape, hand hygiene and skin antiseptic. A timeout was performed prior to the  initiation of the procedure. Patient positioned supine on the angiography table. Right neck and anterior upper chest prepped and draped in the usual sterile fashion. The right internal jugular vein was evaluated with ultrasound and shown to be patent. A permanent ultrasound image was obtained and placed in the patient's medical record. Using sterile gel and a sterile probe cover, the right internal jugular vein was entered with a 21 ga needle during real time ultrasound guidance. 0.018 inch guidewire advanced to the IVC under fluoroscopic guidance. Peel-away sheath placed. Attention then turned to the right anterior upper chest. Following local lidocaine administration, the catheter was tunneled from the chest wall to the venotomy site. The catheter was inserted through the peel-away sheath. The tip of the catheter was positioned at the cavoatrial junction. All lumens of the catheter aspirated and flushed well. The catheter was secured to the skin with suture. Skin overlying the right neck venotomy site was sealed with Dermabond. The insertion site was covered with a Biopatch and sterile dressing. IMPRESSION: Dual lumen right IJ tunneled PICC ready for use. Electronically Signed   By: Miachel Roux M.D.   On: 04/11/2020 10:05   DG Foot Complete Right  Result Date: 04/03/2020 CLINICAL DATA:  Pain EXAM: RIGHT FOOT COMPLETE - 3+ VIEW COMPARISON:  February 08, 2020 FINDINGS: Frontal, oblique, and lateral views were obtained. Portions of the mid to distal aspects of the first distal phalanx are absent, likely due to chronic bony destruction. There is a focal area of apparent bony destruction along the lateral aspect of the distal portion of the remaining first distal phalanx, concerning for active osteomyelitis in this area. No other bony destruction seen. Prior fractures of the proximal aspect of the second proximal phalanx and distal aspect of the third proximal phalanx appear stable. No new fracture evident. No  dislocation. Bones are osteoporotic. Joint spaces appear unremarkable. There is widespread arterial vascular calcification. IMPRESSION: Apparent osteomyelitis involving a portion of the first distal phalanx. Note that much of this bone has undergone previous resorption, likely due to chronic osteomyelitis change. Diffuse osteoporosis. Prior fractures of the proximal aspect of the second proximal phalanx and distal aspect of the third proximal phalanx. No acute fracture or dislocation. Widespread arterial vascular calcification noted. Electronically Signed   By: Lowella Grip III M.D.   On: 04/03/2020 14:40   ECHOCARDIOGRAM COMPLETE  Result Date: 03/14/2020  ECHOCARDIOGRAM REPORT   Patient Name:   JERSIE BEEL Date of Exam: 03/14/2020 Medical Rec #:  [id number]        Height:       65.0 in Accession #:    [id number]       Weight:       137.0 lb Date of Birth:  October 01, 1945        BSA:          1.684 m Patient Age:    85 years         BP:           105/58 mmHg Patient Gender: F                HR:           62 bpm. Exam Location:  [facility] Procedure: 2D Echo, Cardiac Doppler and Color Doppler Indications:    Z95.2 s/p TAVR  History:        Patient has prior history of Echocardiogram examinations, most                 recent 07/18/2019. HOCM, CAD, Pacemaker and Prior CABG, PAD,                 Stroke and CKD; Risk Factors:Dyslipidemia, Hypertension, HLD and                 Diabetes. COVID 19 (2020).                 Aortic Valve: 23 mm Evolt Proplus valve is present in the aortic                 position. Procedure Date: 03/01/19.  Sonographer:    Marygrace Drought RCS Referring Phys: [id number] [place]  1. Small underfilled LV with mid/apical cavity obliteration in systole Mitral valve disease with MS likely contributes to low pre load and underfilling Appears to be a significant mid cavity gradient with velocity 2.9 m/sec at rest with HR 67 bpm This increases to velocity of 3.5 m/sec with  valsalva . Left ventricular ejection fraction, by estimation, is 60 to 65%. The left ventricle has normal function. The left ventricle has no regional wall motion abnormalities. There is mild left ventricular hypertrophy. Left ventricular diastolic parameters are indeterminate.  2. Right ventricular systolic function is normal. The right ventricular size is normal. There is normal pulmonary artery systolic pressure.  3. Left atrial size was mildly dilated.  4. The mitral valve is degenerative. No evidence of mitral valve regurgitation. Moderate mitral stenosis. Severe mitral annular calcification.  5. Post TAVR with 23 mm Medtronic Evolut valve No PVL mean gradient 7 peak 12 mmHg . The aortic valve has been repaired/replaced. Aortic valve regurgitation is not visualized. No aortic stenosis is present. There is a 23 mm Evolt Proplus valve present in the aortic position. Procedure Date: 03/01/19.  6. The inferior vena cava is normal in size with greater than 50% respiratory variability, suggesting right atrial pressure of 3 mmHg. FINDINGS  Left Ventricle: Small underfilled LV with mid/apical cavity obliteration in systole Mitral valve disease with MS likely contributes to low pre load and underfilling Appears to be a significant mid cavity gradient with velocity 2.9 m/sec at rest with HR 67 bpm This increases to velocity of 3.5 m/sec with valsalva. Left ventricular ejection fraction, by estimation, is 60 to 65%. The left ventricle has normal function. The left ventricle has no regional wall motion  abnormalities. The left ventricular internal cavity size was normal in size. There is mild left ventricular hypertrophy. Left ventricular diastolic parameters are indeterminate. Right Ventricle: The right ventricular size is normal. No increase in right ventricular wall thickness. Right ventricular systolic function is normal. There is normal pulmonary artery systolic pressure. The tricuspid regurgitant velocity is 2.43 m/s,  and  with an assumed right atrial pressure of 3 mmHg, the estimated right ventricular systolic pressure is 43.3 mmHg. Left Atrium: Left atrial size was mildly dilated. Right Atrium: Right atrial size was normal in size. Pericardium: There is no evidence of pericardial effusion. Mitral Valve: The mitral valve is degenerative in appearance. There is moderate thickening of the mitral valve leaflet(s). There is moderate calcification of the mitral valve leaflet(s). Severe mitral annular calcification. No evidence of mitral valve regurgitation. Moderate mitral valve stenosis. MV peak gradient, 8.8 mmHg. The mean mitral valve gradient is 3.0 mmHg. Tricuspid Valve: The tricuspid valve is normal in structure. Tricuspid valve regurgitation is not demonstrated. No evidence of tricuspid stenosis. Aortic Valve: Post TAVR with 23 mm Medtronic Evolut valve No PVL mean gradient 7 peak 12 mmHg. The aortic valve has been repaired/replaced. Aortic valve regurgitation is not visualized. No aortic stenosis is present. Aortic valve mean gradient measures 8.0 mmHg. Aortic valve peak gradient measures 14.7 mmHg. Aortic valve area, by VTI measures 1.64 cm. There is a 23 mm Evolt Proplus valve present in the aortic position. Procedure Date: 03/01/19. Pulmonic Valve: The pulmonic valve was normal in structure. Pulmonic valve regurgitation is not visualized. No evidence of pulmonic stenosis. Aorta: The aortic root is normal in size and structure. Venous: The inferior vena cava is normal in size with greater than 50% respiratory variability, suggesting right atrial pressure of 3 mmHg. IAS/Shunts: No atrial level shunt detected by color flow Doppler.  LEFT VENTRICLE PLAX 2D LVIDd:         2.40 cm  Diastology LVIDs:         1.70 cm  LV e' medial:    2.61 cm/s LV PW:         0.80 cm  LV E/e' medial:  28.7 LV IVS:        1.10 cm  LV e' lateral:   7.18 cm/s LVOT diam:     1.80 cm  LV E/e' lateral: 10.4 LV SV:         55 LV SV Index:   33 LVOT Area:      2.54 cm  RIGHT VENTRICLE RV Basal diam:  2.70 cm RV S prime:     6.53 cm/s TAPSE (M-mode): 1.0 cm RVSP:           26.6 mmHg LEFT ATRIUM             Index       RIGHT ATRIUM           Index LA diam:        4.10 cm 2.43 cm/m  RA Pressure: 3.00 mmHg LA Vol (A2C):   58.7 ml 34.85 ml/m RA Area:     10.40 cm LA Vol (A4C):   32.4 ml 19.24 ml/m RA Volume:   20.10 ml  11.93 ml/m LA Biplane Vol: 46.2 ml 27.43 ml/m  AORTIC VALVE AV Area (Vmax):    1.74 cm AV Area (Vmean):   1.93 cm AV Area (VTI):     1.64 cm AV Vmax:           192.00 cm/s AV Vmean:  126.000 cm/s AV VTI:            0.339 m AV Peak Grad:      14.7 mmHg AV Mean Grad:      8.0 mmHg LVOT Vmax:         131.00 cm/s LVOT Vmean:        95.400 cm/s LVOT VTI:          0.218 m LVOT/AV VTI ratio: 0.64  AORTA Ao Root diam: 2.60 cm MITRAL VALVE                TRICUSPID VALVE MV Area (PHT): 1.75 cm     TR Peak grad:   23.6 mmHg MV Area VTI:   1.08 cm     TR Vmax:        243.00 cm/s MV Peak grad:  8.8 mmHg     Estimated RAP:  3.00 mmHg MV Mean grad:  3.0 mmHg     RVSP:           26.6 mmHg MV Vmax:       1.48 m/s MV Vmean:      80.7 cm/s    SHUNTS MV Decel Time: 434 msec     Systemic VTI:  0.22 m MV E velocity: 75.00 cm/s   Systemic Diam: 1.80 cm MV A velocity: 133.00 cm/s MV E/A ratio:  0.56 Jenkins Rouge MD Electronically signed by Jenkins Rouge MD Signature Date/Time: 03/14/2020/4:53:10 PM    Final     Subjective:   Patient was seen and examined 04/11/2020, 3:20 PM Patient stable today. No acute distress.  No issues overnight Stable for discharge.  Discharge Exam:    Vitals:   04/11/20 1030 04/11/20 1100 04/11/20 1219 04/11/20 1323  BP: (!) 108/54 (!) 112/52 (!) 114/50   Pulse: 67 69 66   Resp: _0 Temp: 97.6 F (36.4 C) 97.6 F (36.4 C) 97.6 F (36.4 C) 97.8 F (36.6 C)  TempSrc: Axillary Oral Axillary Oral  SpO2: 100% 100% 100%   Weight:      Height:        General: Pt lying comfortably in bed & appears in no obvious  distress. Cardiovascular: S1 & S2 heard, RRR, S1/S2 +. No murmurs, rubs, gallops or clicks. No JVD or pedal edema. Respiratory: Clear to auscultation without wheezing, rhonchi or crackles. No increased work of breathing. Abdominal:  Non-distended, non-tender & soft. No organomegaly or masses appreciated. Normal bowel sounds heard. CNS: Alert and oriented. No focal deficits. Extremities: no edema, no cyanosis Pressure Injury 04/04/20 (Active)  04/04/20 1030  Location:   Location Orientation:   Staging:   Wound Description (Comments):   Present on Admission:      Pressure Injury 04/04/20 Sacrum Medial Unstageable - Full thickness tissue loss in which the base of the injury is covered by slough (yellow, tan, gray, green or brown) and/or eschar (tan, brown or black) in the wound bed. Physical therapy only-  sacral  (Active)  04/04/20 1030  Location: Sacrum  Location Orientation: Medial  Staging: Unstageable - Full thickness tissue loss in which the base of the injury is covered by slough (yellow, tan, gray, green or brown) and/or eschar (tan, brown or black) in the wound bed.  Wound Description (Comments): Physical therapy only-  sacral wound covered with dark soft eschar, malodorous  Present on Admission: Yes      The results of significant diagnostics from this hospitalization (including imaging, microbiology, ancillary and laboratory) are  listed below for reference.      Microbiology:   Recent Results (from the past 240 hour(s))  Culture, blood (routine x 2)     Status: None   Collection Time: 04/03/20  2:45 PM   Specimen: BLOOD  Result Value Ref Range Status   Specimen Description   Final    BLOOD RIGHT ARM Performed at [facility] [address] [place], [place] [address]    Special Requests   Final    BOTTLES DRAWN AEROBIC AND ANAEROBIC Blood Culture adequate volume Performed at [facility] [address] [place], [place]  [address]    Culture   Final    NO GROWTH 5 DAYS Performed at [facility], [place] [address] [place], [place] [address]    Report Status 04/08/2020 FINAL  Final  Resp Panel by RT-PCR (Flu A&B, Covid) Nasopharyngeal Swab     Status: None   Collection Time: 04/03/20  2:46 PM   Specimen: Nasopharyngeal Swab; Nasopharyngeal(NP) swabs in vial transport medium  Result Value Ref Range Status   SARS Coronavirus 2 by RT PCR NEGATIVE NEGATIVE Final    Comment: (NOTE) SARS-CoV-2 target nucleic acids are NOT DETECTED.  The SARS-CoV-2 RNA is generally detectable in upper respiratory specimens during the acute phase of infection. The lowest concentration of SARS-CoV-2 viral copies this assay can detect is 138 copies/mL. A negative result does not preclude SARS-Cov-2 infection and should not be used as the sole basis for treatment or other patient management decisions. A negative result may occur with  improper specimen collection/handling, submission of specimen other than nasopharyngeal swab, presence of viral mutation(s) within the areas targeted by this assay, and inadequate number of viral copies(<138 copies/mL). A negative result must be combined with clinical observations, patient history, and epidemiological information. The expected result is Negative.  Fact Sheet for Patients:  EntrepreneurPulse.com.au  Fact Sheet for Healthcare Providers:  IncredibleEmployment.be  This test is no t yet approved or cleared by the [place] FDA and  has been authorized for detection and/or diagnosis of SARS-CoV-2 by FDA under an Emergency Use Authorization (EUA). This EUA will remain  in effect (meaning this test can be used) for the duration of the COVID-19 declaration under Section 564(b)(1) of the Act, 21 U.S.C.section 360bbb-3(b)(1), unless the authorization is terminated  or revoked sooner.       Influenza A by PCR NEGATIVE NEGATIVE Final   Influenza  B by PCR NEGATIVE NEGATIVE Final    Comment: (NOTE) The Xpert Xpress SARS-CoV-2/FLU/RSV plus assay is intended as an aid in the diagnosis of influenza from Nasopharyngeal swab specimens and should not be used as a sole basis for treatment. Nasal washings and aspirates are unacceptable for Xpert Xpress SARS-CoV-2/FLU/RSV testing.  Fact Sheet for Patients: EntrepreneurPulse.com.au  Fact Sheet for Healthcare Providers: IncredibleEmployment.be  This test is not yet approved or cleared by the [place] FDA and has been authorized for detection and/or diagnosis of SARS-CoV-2 by FDA under an Emergency Use Authorization (EUA). This EUA will remain in effect (meaning this test can be used) for the duration of the COVID-19 declaration under Section 564(b)(1) of the Act, 21 U.S.C. section 360bbb-3(b)(1), unless the authorization is terminated or revoked.  Performed at [facility], [place] [address] [place], [place] [address]   MRSA PCR Screening     Status: None   Collection Time: 04/06/20  3:30 PM   Specimen: Nasal Mucosa; Nasopharyngeal  Result Value Ref Range  Status   MRSA by PCR NEGATIVE NEGATIVE Final    Comment:        The GeneXpert MRSA Assay (FDA approved for NASAL specimens only), is one component of a comprehensive MRSA colonization surveillance program. It is not intended to diagnose MRSA infection nor to guide or monitor treatment for MRSA infections. Performed at [facility], [place] [address], [place], [place] [address]   SARS CORONAVIRUS 2 (TAT 6-24 HRS) Nasopharyngeal Nasopharyngeal Swab     Status: None   Collection Time: 04/10/20  5:50 PM   Specimen: Nasopharyngeal Swab  Result Value Ref Range Status   SARS Coronavirus 2 NEGATIVE NEGATIVE Final    Comment: (NOTE) SARS-CoV-2 target nucleic acids are NOT DETECTED.  The SARS-CoV-2 RNA is generally detectable in upper and lower respiratory  specimens during the acute phase of infection. Negative results do not preclude SARS-CoV-2 infection, do not rule out co-infections with other pathogens, and should not be used as the sole basis for treatment or other patient management decisions. Negative results must be combined with clinical observations, patient history, and epidemiological information. The expected result is Negative.  Fact Sheet for Patients: SugarRoll.be  Fact Sheet for Healthcare Providers: https://www.woods-mathews.com/  This test is not yet approved or cleared by the [place] FDA and  has been authorized for detection and/or diagnosis of SARS-CoV-2 by FDA under an Emergency Use Authorization (EUA). This EUA will remain  in effect (meaning this test can be used) for the duration of the COVID-19 declaration under Se ction 564(b)(1) of the Act, 21 U.S.C. section 360bbb-3(b)(1), unless the authorization is terminated or revoked sooner.  Performed at [facility], [place] [address], [place], [place] [address]      Labs:   CBC: Recent Labs  Lab 04/07/20 0618 04/08/20 0721 04/09/20 0601 04/10/20 0530 04/11/20 0544  WBC 16.5* 14.1* 13.2* 14.8* 13.7*  NEUTROABS 14.3* 10.6* 8.9* 10.5* 10.0*  HGB 7.2* 6.5* 7.4* 7.7* 7.5*  HCT 22.3* 20.2* 23.6* 24.8* 25.1*  MCV 94.5 97.6 97.5 98.4 100.8*  PLT 273 263 255 286 676   Basic Metabolic Panel: Recent Labs  Lab 04/07/20 0618 04/08/20 0721 04/09/20 0601 04/10/20 0530 04/11/20 0544  NA 137 138 139 140 142  K 3.6 3.0* 3.9 4.4 5.3*  CL 111 116* 114* 115* 119*  CO2 15* 14* 18* 18* 16*  GLUCOSE 68* 85 108* 121* 92  BUN 80* 71* 79* 78* 78*  CREATININE 2.00* 1.48* 1.76* 1.65* 1.66*  CALCIUM 6.9* 6.2* 8.1* 8.5* 8.5*   Liver Function Tests: Recent Labs  Lab 04/06/20 0552 04/07/20 0618 04/09/20 0601 04/10/20 0530 04/11/20 0544  AST _0 ALT _1 ALKPHOS 129* 102 112 118 124   BILITOT 0.5 0.8 0.9 0.8 0.5  PROT 5.6* 4.4* 4.8* 5.0* 4.9*  ALBUMIN 1.6* 1.3* 1.4* 1.4* 1.4*   BNP (last 3 results) Recent Labs    06/24/19 1605 07/17/19 2150  BNP 430.7* 424.2*   Cardiac Enzymes: No results for input(s): CKTOTAL, CKMB, CKMBINDEX, TROPONINI in the last 168 hours. CBG: Recent Labs  Lab 04/11/20 0105 04/11/20 0729 04/11/20 0802 04/11/20 0831 04/11/20 1215  GLUCAP 97 67* 65* 79 109*   Urinalysis    Component Value Date/Time   COLORURINE STRAW (A) 07/17/2019 2137   APPEARANCEUR CLEAR 07/17/2019 2137   APPEARANCEUR Clear 09/01/2018 1131   LABSPEC 1.009 07/17/2019 2137   PHURINE 8.0 07/17/2019 2137   GLUCOSEU 150 (A) 07/17/2019  2137   GLUCOSEU NEG mg/dL 12/25/2006 2041   HGBUR SMALL (A) 07/17/2019 2137   HGBUR trace-lysed 03/05/2010 0856   BILIRUBINUR NEGATIVE 07/17/2019 2137   BILIRUBINUR Negative 09/01/2018 1131   KETONESUR NEGATIVE 07/17/2019 2137   PROTEINUR 100 (A) 07/17/2019 2137   UROBILINOGEN 0.2 11/11/2011 1701   UROBILINOGEN 0.2 11/11/2011 1645   NITRITE NEGATIVE 07/17/2019 2137   LEUKOCYTESUR NEGATIVE 07/17/2019 2137   Pressure Injury 04/04/20 (Active)  04/04/20 1030  Location:   Location Orientation:   Staging:   Wound Description (Comments):   Present on Admission:      Pressure Injury 04/04/20 Sacrum Medial Unstageable - Full thickness tissue loss in which the base of the injury is covered by slough (yellow, tan, gray, green or brown) and/or eschar (tan, brown or black) in the wound bed. Physical therapy only-  sacral  (Active)  04/04/20 1030  Location: Sacrum  Location Orientation: Medial  Staging: Unstageable - Full thickness tissue loss in which the base of the injury is covered by slough (yellow, tan, gray, green or brown) and/or eschar (tan, brown or black) in the wound bed.  Wound Description (Comments): Physical therapy only-  sacral wound covered with dark soft eschar, malodorous  Present on Admission: Yes       Time  coordinating discharge: Over 45 minutes  SIGNED: Deatra James, MD, FACP, [facility]. [facility],  Please use [email or web address] to Page If 7PM-7AM, please contact night-coverage [email or web address] 04/11/2020, 3:20 PM

## 2020-04-11 NOTE — TOC Transition Note (Signed)
Transition of Care [facility]) - CM/SW Discharge Note   Patient Details  Name: [name] MRN: [id number] Date of Birth: 05/09/1945  [facility] CM/SW Contact:  Ross Ludwig, LCSW Phone Number: 04/11/2020, 2:34 PM   Clinical Narrative:     Patient to be d/c'ed today to [facility] room 8B.  Patient and family agreeable to plans will transport via ems RN to call report to [phone].  Palliative requested to follow patient at SNF, CSW made referral to [facility] and spoke to [place] she is aware of referral.  Patient's daughter is aware that patient is discharging today.       Final next level of care: Skilled Nursing Facility Barriers to Discharge: Barriers Resolved   Patient Goals and CMS Choice Patient states their goals for this hospitalization and ongoing recovery are:: To go to [facility]. CMS Medicare.gov Compare Post Acute Care list provided to:: Patient Represenative (must comment) Choice offered to / list presented to : Adult Children  Discharge Placement   Existing PASRR number confirmed : 04/11/20          Patient chooses bed at: [facility] Patient to be transferred to facility by: [facility] Name of family member notified: Armstrong,[name] Daughter [phone] Patient and family notified of of transfer: 04/11/20  Discharge Plan and Services     Post Acute Care Choice: Nursing Home                               Social Determinants of Health (SDOH) Interventions     Readmission Risk Interventions No flowsheet data found.

## 2020-04-11 NOTE — Plan of Care (Signed)
  Problem: Health Behavior/Discharge Planning: Goal: Ability to manage health-related needs will improve Outcome: Progressing   Problem: Activity: Goal: Risk for activity intolerance will decrease Outcome: Not Progressing   Problem: Nutrition: Goal: Adequate nutrition will be maintained Outcome: Not Progressing

## 2020-04-11 NOTE — Progress Notes (Signed)
Hypoglycemic Event  CBG: 65  Treatment: 1 tube glucose gel  Symptoms: None  Follow-up CBG: [id number] CBG Result:76  Possible Reasons for Event: Inadequate meal intake  Comments/MD notified:Dr. Ortencia Kick, Larence Penning

## 2020-04-12 ENCOUNTER — Ambulatory Visit: Payer: Medicare Other | Admitting: Sports Medicine

## 2020-04-12 DIAGNOSIS — I10 Essential (primary) hypertension: Secondary | ICD-10-CM | POA: Diagnosis not present

## 2020-04-12 DIAGNOSIS — E1122 Type 2 diabetes mellitus with diabetic chronic kidney disease: Secondary | ICD-10-CM | POA: Diagnosis not present

## 2020-04-12 DIAGNOSIS — M869 Osteomyelitis, unspecified: Secondary | ICD-10-CM | POA: Diagnosis not present

## 2020-04-12 DIAGNOSIS — I5033 Acute on chronic diastolic (congestive) heart failure: Secondary | ICD-10-CM | POA: Diagnosis not present

## 2020-04-13 ENCOUNTER — Encounter: Payer: Self-pay | Admitting: Nurse Practitioner

## 2020-04-13 ENCOUNTER — Non-Acute Institutional Stay: Payer: Self-pay | Admitting: Nurse Practitioner

## 2020-04-13 ENCOUNTER — Other Ambulatory Visit: Payer: Self-pay

## 2020-04-13 DIAGNOSIS — Z515 Encounter for palliative care: Secondary | ICD-10-CM

## 2020-04-13 DIAGNOSIS — I639 Cerebral infarction, unspecified: Secondary | ICD-10-CM

## 2020-04-13 NOTE — Progress Notes (Signed)
[place] Consult Note Telephone: [phone]  Fax: [phone]  PATIENT NAME: [name] DOB: 02-27-1945 MRN: [id number]  PRIMARY CARE PROVIDER:   [facility] RESPONSIBLE PARTY:   Daughter [name] [phone]   I was asked to see [name] by Dr Reesa Chew for [place] consult for complex medical decision making  1. Advance Care Planning; Full code  Jehovah's Witness  2. Goals of Care: Goals include to maximize quality of life and symptom management. Our advance care planning conversation included a discussion about:     The value and importance of advance care planning   Exploration of personal, cultural or spiritual beliefs that might influence medical decisions   Exploration of goals of care in the event of a sudden injury or illness   Identification and preparation of a healthcare agent   Review and updating or creation of an  advance directive document.   3. Palliative care encounter; Palliative care encounter; Palliative medicine team will continue to support patient, patient's family, and medical team. Visit consisted of counseling and education dealing with the complex and emotionally intense issues of symptom management and palliative care in the setting of serious and potentially life-threatening illness   4. f/u 1 week for ongoing monitoring chronic disease progression, ongoing discussions complex medical decision making  I spent 60 minutes providing this consultation, starting at 10:30am More than 50% of the time in this consultation was spent coordinating communication.   HISTORY OF PRESENT ILLNESS:  [name] is a 75 y.o. year old female with multiple medical problems including History of adenocarcinoma of breast s/p chemotherapy and radiation, CVA, aortic stenosis s/p TAVR 03/01/2019. Aortic valve repair 2006, pacemaker place 01/2019, coronary artery disease, it worded Ortho  sclerosis, congestive heart failure, chronic kidney disease, hypertension, osteomyelitis of toe, sacral decubitus ulcer, hyperlipidemia, diabetes, depression, hypertension. Hospitalized 2 / 8 / 2022 to 2/16 / 2022 from skilled facility wheelchair-bound at baseline with critical right lower extremity is schema s /p angiography recanalization A NT tibial artery balloon angioplasty on aspirin and Plavix. Workout significant for osteomyelitis right great toe, sacral decubitus and right heel ulcer with culture positive for morganella with adjusted antibiotics. General surgery for 4 sacral ulcer and bedside debridement. Podiatry saw and placed on invanz with CT scan underwent debridement of first proximal flange slightly hypotensive but stabilized. Discharge to [facility] where she currently resides. [name] is a full code. PICC line right chest, foley, confusion. 2 / 17/2022  to [place] with discharge planner at [facility], on skill days completed remain in Long-Term care in this facility or transfer to another facility if possible. Staff requires assistance for turning, positioning, bathing. [name] does require assistance with feeding, currently on Diabetic diet, Level 4 - Pureed texture, Regular Liquids consistency, prostat, ensure with speech evaluation yesterday. Staff endorses [name] does remain confused. I visited and observed [name]. At present time [name] is lying in bed, appears to debilitated, chronically ill. [name] did make eye contact with verbal cues. [name] did ask to speak to her nurse. We talked about symptoms of pain, shortness of breath, though limited with cognitive impairment, confused. It was difficult for [name] to answer questions as she does remains confused. [name] was cooperative with the assessment. Medical goals review. I did attempt to contact [name] daughter, Daughter [name] message left to return call for  further discussion  of complex medical decision-making including revisiting code status. I updated nursing staff.  Palliative Care was asked to help address goals of care.   CODE STATUS: Full code  PPS: 30% HOSPICE ELIGIBILITY/DIAGNOSIS: TBD  PAST MEDICAL HISTORY:  Past Medical History:  Diagnosis Date  . Acute on chronic diastolic CHF (congestive heart failure) ([place]) 06/22/2017  . Acute on chronic renal failure ([place]) 05/25/2011  . Adenocarcinoma of breast ([place]) 1996   Completed tamoxifen and had mastectomy.  . Aortic stenosis, severe    s/p aortic valve replacement with porcine valve 06/2004.  ECHO 2010 EF 46%, LVH, diastolic dysfxn, Bioprostetic aoritc valve, mild AS. ECHO 2013 EF 60%, Nl aortic artificial valve, dynamic obstruction in the outflow tract   Class IIb rec for annual TTE after 5 yrs. She had a TTE 2013    . Arthritis   . CAD (coronary artery disease) 2006   s/p CABG (5/06) w/ saphenous vein to RCA at time of AVR  . CKD (chronic kidney disease) stage 2, GFR 60-89 ml/min 06/03/2011   She is no longer taking NSAIDs.   Mar[place] Kitchen CVA (cerebral infarction) 2006   Post-op from AVR. Presumed embolic in nature. Carotid stenosis of R 60-79%. Repeat dopplers 4/10 no R stenosis and L stenosis of 1-29%.  . Depression    Controlled on Paxil  . Diabetes mellitus 1992   Dx 04/25/1990. Now insulin dependent, started 2008. On ACEI.   . Diverticulosis 2001  . History of 2019 novel coronavirus disease (COVID-19)   . Hyperlipidemia    Mgmt with a statin  . Hypertension    Requires 4 drug tx  . Osteoporosis 2006   DEXA 10/06 : L femur T -2.8, R -2.7. Lumbar T -2.4. On bisphosphonates and  Calcium / Vit D.  . Peripheral vascular disease ([place])   . Presence of permanent cardiac pacemaker   . Refusal of blood transfusions as patient is Jehovah's Witness   . Stroke [facility])     SOCIAL HX:  Social History   Tobacco Use  . Smoking status: Never Smoker  . Smokeless tobacco: Never Used  Substance Use  Topics  . Alcohol use: Yes    Alcohol/week: 0.0 standard drinks    Comment: Occasional beer, monthly    ALLERGIES:  Allergies  Allergen Reactions  . Doxycycline     Made her stopped eating   . 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," as the patient is a Jehovah's Witness      PHYSICAL EXAM:   General: chronically ill, debilitated, confused female Cardiovascular: regular rate and rhythm Pulmonary: decrease bases Neurological: bedbound  Jurell Basista Ihor Gully, NP

## 2020-04-16 DIAGNOSIS — T8189XA Other complications of procedures, not elsewhere classified, initial encounter: Secondary | ICD-10-CM | POA: Diagnosis not present

## 2020-04-16 DIAGNOSIS — L89159 Pressure ulcer of sacral region, unspecified stage: Secondary | ICD-10-CM | POA: Diagnosis not present

## 2020-04-16 DIAGNOSIS — L89619 Pressure ulcer of right heel, unspecified stage: Secondary | ICD-10-CM | POA: Diagnosis not present

## 2020-04-16 DIAGNOSIS — E1140 Type 2 diabetes mellitus with diabetic neuropathy, unspecified: Secondary | ICD-10-CM | POA: Diagnosis not present

## 2020-04-16 DIAGNOSIS — M62262 Nontraumatic ischemic infarction of muscle, left lower leg: Secondary | ICD-10-CM | POA: Diagnosis not present

## 2020-04-16 DIAGNOSIS — D509 Iron deficiency anemia, unspecified: Secondary | ICD-10-CM | POA: Diagnosis not present

## 2020-04-16 DIAGNOSIS — R278 Other lack of coordination: Secondary | ICD-10-CM | POA: Diagnosis not present

## 2020-04-16 DIAGNOSIS — N183 Chronic kidney disease, stage 3 unspecified: Secondary | ICD-10-CM | POA: Diagnosis not present

## 2020-04-16 DIAGNOSIS — L89150 Pressure ulcer of sacral region, unstageable: Secondary | ICD-10-CM | POA: Diagnosis not present

## 2020-04-16 DIAGNOSIS — M869 Osteomyelitis, unspecified: Secondary | ICD-10-CM | POA: Diagnosis not present

## 2020-04-16 DIAGNOSIS — I10 Essential (primary) hypertension: Secondary | ICD-10-CM | POA: Diagnosis not present

## 2020-04-16 DIAGNOSIS — M6281 Muscle weakness (generalized): Secondary | ICD-10-CM | POA: Diagnosis not present

## 2020-04-19 ENCOUNTER — Telehealth: Payer: Self-pay | Admitting: Nurse Practitioner

## 2020-04-19 NOTE — Telephone Encounter (Signed)
Daughter [name] [phone] Ms Don returned call. Clinical update discussed. We talked about purpose PC visit, PC visit with Ms. Criado. We talked about past medical history, chronic disease progression, recent hospitalization. We talked about her functional level prior to hospitalization and currently. Ms. Hewins was steps with walker prior to hospitalization but not since. [name] endorses the hope is to transition to LTC at [facility]. We talked about medical goals of care. Discussed will f/u with PC visit tomorrow at facility and re-contact [facility] for ongoing discussions [place].   Total time spent 25 minutes Phone 20 minutes Documentation 5 minutes

## 2020-04-20 ENCOUNTER — Encounter (HOSPITAL_BASED_OUTPATIENT_CLINIC_OR_DEPARTMENT_OTHER): Payer: Medicare Other | Admitting: Internal Medicine

## 2020-04-20 ENCOUNTER — Other Ambulatory Visit: Payer: Self-pay

## 2020-04-20 ENCOUNTER — Encounter: Payer: Self-pay | Admitting: Nurse Practitioner

## 2020-04-20 ENCOUNTER — Non-Acute Institutional Stay: Payer: Medicare Other | Admitting: Nurse Practitioner

## 2020-04-20 DIAGNOSIS — I639 Cerebral infarction, unspecified: Secondary | ICD-10-CM

## 2020-04-20 DIAGNOSIS — Z515 Encounter for palliative care: Secondary | ICD-10-CM

## 2020-04-20 NOTE — Progress Notes (Addendum)
Therapist, nutritional Palliative Care Consult Note Telephone: [phone]  Fax: [phone]  PATIENT NAME: [name] DOB: 05/22/1945 MRN: [id number]  PRIMARY CARE PROVIDER:   [facility]  REFERRING PROVIDER:  [facility]  Responsible party: [name] daughter [phone]  1. Advance Care Planning; Reviewed; Full code, ongoing discussions with daughters about code status and full scope of treatment  2. Goals of Care: Goals include to maximize quality of life and symptom management. Our advance care planning conversation included a discussion about:     The value and importance of advance care planning   Exploration of personal, cultural or spiritual beliefs that might influence medical decisions   Exploration of goals of care in the event of a sudden injury or illness   Identification and preparation of a healthcare agent   Review and updating or creation of an  advance directive document.  3. Debility/deconditioning. Continue to encourage work with therapy, hoyer to recliner.   4. Palliative care encounter; Palliative care encounter; Palliative medicine team will continue to support patient, patient's family, and medical team. Visit consisted of counseling and education dealing with the complex and emotionally intense issues of symptom management and palliative care in the setting of serious and potentially life-threatening illness  5. f/u 1 week for ongoing monitoring chronic disease progression, ongoing discussions complex medical decision making  I reviewed the patient chart, labs, medications and all admission documents from the facility here. I reviewed the patient, all hospital/facility records including H&P, discharge summary, medications, Lab tests ect. I discussed case with staff and discussed diagnosis and treatment plans with staff.   I spent 60 minutes providing this consultation, starting at  12:00pm. More than 50% of the time in this consultation was spent coordinating communication.   HISTORY OF PRESENT ILLNESS:  [name] is a 75 y.o. year old female with multiple medical problems including History of adenocarcinoma of breast s/p chemotherapy and radiation, CVA, aortic stenosis s/p TAVR 03/01/2019. Aortic valve repair 2006, pacemaker place 01/2019, coronary artery disease, it worded Ortho sclerosis, congestive heart failure, chronic kidney disease, hypertension, osteomyelitis of toe, sacral decubitus ulcer, hyperlipidemia, diabetes, depression, hypertension. Hospitalized 2 / 8 / 2022 to 2/16 / 2022 from skilled facility wheelchair-bound at baseline with critical right lower extremity is schema s /p angiography recanalization A NT tibial artery balloon angioplasty [name] aspirin and Plavix. Workout significant for osteomyelitis right great toe, sacral decubitus and right heel ulcer with culture positive for morganella with adjusted antibiotics. General surgery for 4 sacral ulcer and bedside debridement. Podiatry saw and placed [name] invanz with CT scan underwent debridement of first proximal flange slightly hypotensive but stabilized. Discharge to [facility] where she currently resides. [name] continues to reside at [facility]. Ms [name] is a hoyer to a recliner, total ADL assistance for bathing, dressing, toileting. Ms [name] requires assistance for feeding. Ms [name] daughter [name] requested Palliative care visit her follow-up today. Ms [name] continues to receive therapy, and participating her staff. At present Ms [name] is lying in bed. Staff was getting ready to hoyer [name] to the recliner. Ms [name] and I talked about Palliative care visit. We talked about how she was feeling today. Ms [name] was more clear with her speech, making eye contact and not as confused as she was the initial Palliative care visit. We talked  about being hired to the recliner. We talked about  work with therapy. We talked about symptoms of pain but she denies currently. We talked about appetite. We talked about her daughter's visiting if she has nine total all involved in her care. We talked about sleep patterns for which she has been sleeping. Discussed with Ms [name] will contact her daughter [name] for update [name] Palliative care visit. Emotional support provided. Praised Ms [name] for getting out of bed. Ms [name] was cooperative with assessment. I called [name], Ms [name] daughter. Clinical update discussed. We talked about Palliative care visit with Ms [name]. We talked about symptoms, work with therapy. We talked about Ms [name] being hoyer out of bed to recliner today. We talked about symptoms, work with therapy and that they were in the process of ordering her to the recliner. We talked about medical goals of care including aggressive versus conservative versus comfort care. We talked about code status as currently  Ms [name] is a full code. [name] endorses that she did ask Ms [name] and she wish to have CPR. We talked about different scenarios with CPR. At present time Ms [name] will remain a full code with [name] going to scussion with [name] and her sisters, concerning complex medical decision making and code status. We talked about role of Palliative care with plan of care. We talked about follow up visit in 2 weeks if needed or sooner should she declined, to monitor progression with therapy, weights and ongoing discussion of complex medical goals of care. We talked about having Facility Social Work and Nursing contact her for updates. We talked about wishes to transition to long-term care possibly at [facility] once therepy is completed. I updated nursing staff, [name] of nursing and search for work. Contact information provided..   Palliative Care was asked to help to continue to address goals of care.   ROS reviewed  all negative except weakness, fatigue, decrease appetite  I reviewed the patient chart, labs, medications and all admission documents from the facility here. I reviewed the patient, all hospital/facility records including H&P, discharge summary, medications, Lab tests ect. I discussed case with staff and discussed diagnosis and treatment plans with staff.   CODE STATUS: full code  PPS: 40% HOSPICE ELIGIBILITY/DIAGNOSIS: TBD  PAST MEDICAL HISTORY:  Past Medical History:  Diagnosis Date  . Acute [name] chronic diastolic CHF (congestive heart failure) ([place]) 06/22/2017  . Acute [name] chronic renal failure ([place]) 05/25/2011  . Adenocarcinoma of breast ([place]) 1996   Completed tamoxifen and had mastectomy.  . Aortic stenosis, severe    s/p aortic valve replacement with porcine valve 06/2004.  ECHO 2010 EF 12%, LVH, diastolic dysfxn, Bioprostetic aoritc valve, mild AS. ECHO 2013 EF 60%, Nl aortic artificial valve, dynamic obstruction in the outflow tract   Class IIb rec for annual TTE after 5 yrs. She had a TTE 2013    . Arthritis   . CAD (coronary artery disease) 2006   s/p CABG (5/06) w/ saphenous vein to RCA at time of AVR  . CKD (chronic kidney disease) stage 2, GFR 60-89 ml/min 06/03/2011   She is no longer taking NSAIDs.   Marland Kitchen CVA (cerebral infarction) 2006   Post-op from AVR. Presumed embolic in nature. Carotid stenosis of R 60-79%. Repeat dopplers 4/10 no R stenosis and L stenosis of 1-29%.  . Depression    Controlled [name] Paxil  . Diabetes mellitus 1992   Dx 04/25/1990. Now insulin dependent, started 2008. [name] ACEI.   . Diverticulosis 2001  . History of  2019 novel coronavirus disease (COVID-19)   . Hyperlipidemia    Mgmt with a statin  . Hypertension    Requires 4 drug tx  . Osteoporosis 2006   DEXA 10/06 : L femur T -2.8, R -2.7. Lumbar T -2.4. [name] bisphosphonates and  Calcium / Vit D.  . Peripheral vascular disease ([place])   . Presence of permanent cardiac pacemaker   . Refusal of blood  transfusions as patient is Jehovah's Witness   . Stroke [facility])     SOCIAL HX:  Social History   Tobacco Use  . Smoking status: Never Smoker  . Smokeless tobacco: Never Used  Substance Use Topics  . Alcohol use: Yes    Alcohol/week: 0.0 standard drinks    Comment: Occasional beer, monthly    ALLERGIES:  Allergies  Allergen Reactions  . Doxycycline     Made her stopped eating   . 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," as the patient is a Jehovah's Witness       PHYSICAL EXAM:   General: NAD, chronically ill, debilitated, pleasant female, mildly cognitively impaired Cardiovascular: regular rate and rhythm Pulmonary: clear ant fields; decrease bases Neurological: bed-bound; hoyer lift  Gardy Montanari Ihor Gully, NP

## 2020-04-23 DIAGNOSIS — T8189XD Other complications of procedures, not elsewhere classified, subsequent encounter: Secondary | ICD-10-CM | POA: Diagnosis not present

## 2020-04-23 DIAGNOSIS — D509 Iron deficiency anemia, unspecified: Secondary | ICD-10-CM | POA: Diagnosis not present

## 2020-04-23 DIAGNOSIS — M869 Osteomyelitis, unspecified: Secondary | ICD-10-CM | POA: Diagnosis not present

## 2020-04-23 DIAGNOSIS — F329 Major depressive disorder, single episode, unspecified: Secondary | ICD-10-CM | POA: Diagnosis not present

## 2020-04-23 DIAGNOSIS — L89150 Pressure ulcer of sacral region, unstageable: Secondary | ICD-10-CM | POA: Diagnosis not present

## 2020-04-23 DIAGNOSIS — N183 Chronic kidney disease, stage 3 unspecified: Secondary | ICD-10-CM | POA: Diagnosis not present

## 2020-04-24 ENCOUNTER — Telehealth: Payer: Self-pay | Admitting: Sports Medicine

## 2020-04-24 DIAGNOSIS — D509 Iron deficiency anemia, unspecified: Secondary | ICD-10-CM | POA: Diagnosis not present

## 2020-04-24 DIAGNOSIS — L89159 Pressure ulcer of sacral region, unspecified stage: Secondary | ICD-10-CM | POA: Diagnosis not present

## 2020-04-24 DIAGNOSIS — E119 Type 2 diabetes mellitus without complications: Secondary | ICD-10-CM | POA: Diagnosis not present

## 2020-04-24 DIAGNOSIS — I639 Cerebral infarction, unspecified: Secondary | ICD-10-CM | POA: Diagnosis not present

## 2020-04-24 DIAGNOSIS — M869 Osteomyelitis, unspecified: Secondary | ICD-10-CM | POA: Diagnosis not present

## 2020-04-24 DIAGNOSIS — M6281 Muscle weakness (generalized): Secondary | ICD-10-CM | POA: Diagnosis not present

## 2020-04-24 DIAGNOSIS — F329 Major depressive disorder, single episode, unspecified: Secondary | ICD-10-CM | POA: Diagnosis not present

## 2020-04-24 DIAGNOSIS — R1311 Dysphagia, oral phase: Secondary | ICD-10-CM | POA: Diagnosis not present

## 2020-04-24 NOTE — Telephone Encounter (Signed)
It is normal to ooze blood when you have an opening wound that is trying to heal. Continue with dressing changes 2-3x per week. -Dr. Cannon Kettle

## 2020-04-24 NOTE — Telephone Encounter (Signed)
Patients daughter to inform Cannon Kettle that patients surgical area is oozing. Please advise if this is normal after surgery.

## 2020-04-25 DIAGNOSIS — F329 Major depressive disorder, single episode, unspecified: Secondary | ICD-10-CM | POA: Diagnosis not present

## 2020-04-25 DIAGNOSIS — I639 Cerebral infarction, unspecified: Secondary | ICD-10-CM | POA: Diagnosis not present

## 2020-04-25 DIAGNOSIS — M6281 Muscle weakness (generalized): Secondary | ICD-10-CM | POA: Diagnosis not present

## 2020-04-25 DIAGNOSIS — E119 Type 2 diabetes mellitus without complications: Secondary | ICD-10-CM | POA: Diagnosis not present

## 2020-04-25 DIAGNOSIS — D509 Iron deficiency anemia, unspecified: Secondary | ICD-10-CM | POA: Diagnosis not present

## 2020-04-25 DIAGNOSIS — R1311 Dysphagia, oral phase: Secondary | ICD-10-CM | POA: Diagnosis not present

## 2020-04-25 DIAGNOSIS — M869 Osteomyelitis, unspecified: Secondary | ICD-10-CM | POA: Diagnosis not present

## 2020-04-25 DIAGNOSIS — L89159 Pressure ulcer of sacral region, unspecified stage: Secondary | ICD-10-CM | POA: Diagnosis not present

## 2020-04-30 ENCOUNTER — Inpatient Hospital Stay: Payer: Medicare Other | Admitting: Oncology

## 2020-04-30 ENCOUNTER — Inpatient Hospital Stay: Payer: Medicare Other

## 2020-04-30 ENCOUNTER — Encounter: Payer: Self-pay | Admitting: Nurse Practitioner

## 2020-04-30 ENCOUNTER — Non-Acute Institutional Stay: Payer: Medicare Other | Admitting: Nurse Practitioner

## 2020-04-30 DIAGNOSIS — Z515 Encounter for palliative care: Secondary | ICD-10-CM

## 2020-04-30 DIAGNOSIS — I639 Cerebral infarction, unspecified: Secondary | ICD-10-CM

## 2020-04-30 NOTE — Progress Notes (Signed)
Therapist, nutritional Palliative Care Consult Note Telephone: [phone]  Fax: [phone]  PATIENT NAME: [name] DOB: June 08, 1945 MRN: [id number]  PRIMARY CARE PROVIDER:   [facility]  REFERRING PROVIDER:  [facility]  Responsible party: [name] daughter [phone]  1.Advance Care Planning;Reviewed; Full code, ongoing discussions with daughters about code status and full scope of treatment  2. Goals of Care: Goals include to maximize quality of life and symptom management. Our advance care planning conversation included a discussion about:   The value and importance of advance care planning  Exploration of personal, cultural or spiritual beliefs that might influence medical decisions  Exploration of goals of care in the event of a sudden injury or illness  Identification and preparation of a healthcare agent  Review and updating or creation of anadvance directive document.  3. Debility/deconditioning. Continue to encourage work with therapy, hoyer to recliner.   4.Palliative care encounter; Palliative care encounter; Palliative medicine team will continue to support patient, patient's family, and medical team. Visit consisted of counseling and education dealing with the complex and emotionally intense issues of symptom management and palliative care in the setting of serious and potentially life-threatening illness  5. f/u2 weeks for ongoing monitoring chronic disease progression, ongoing discussions complex medical decision making  I spent 50 minutes providing this consultation, starting at 1:45pm More than 50% of the time in this consultation was spent coordinating communication.   HISTORY OF PRESENT ILLNESS:  [name] is a 75 y.o. year old female with multiple medical problems including History of adenocarcinoma of breast s/p chemotherapy and radiation, CVA, aortic stenosis s/p  TAVR 03/01/2019. Aortic valve repair 2006, pacemaker place 01/2019, coronary artery disease, it worded Ortho sclerosis, congestive heart failure, chronic kidney disease, hypertension, osteomyelitis of toe, sacral decubitus ulcer, hyperlipidemia, diabetes, depression, hypertension. Hospitalized 2 / 8 / 2022 to 2/16 / 2022 from skilled facility wheelchair-bound atbaseline with critical right lower extremity is schema s /p angiography recanalization ANT tibial artery balloon angioplasty on aspirin and Plavix. Workout significant for osteomyelitis right great toe, sacral decubitus and right heel ulcer with culture positive for morganella with adjusted antibiotics. General surgeryfor4 sacral ulcer and bedside debridement. Podiatry saw and placed on invanz with CT scan underwent debridement of first proximal flange slightly hypotensive but stabilized. [name] continues to reside at [facility]. [name] will be transitioning to long-term care from short term care is completed. [name] is bed-bound requires assistance for bathing, dressing, toileting. [name] is hoyer to a recliner chair. [name] does require assistance with feeding. Palliative care follow-up visit today to see about progress with therapy, appetite and mobility. Staff endorses [name] has no other changes. At present [name] is sitting up in bed, appears debilitated, smiling. [name] daughter [name] is with her. We talked about purpose of Palliative care visit. [name] in agreement. We talked about how [name] been feeling. [name] endorses that she has had an okay weekend. [name] endorses doing good today. We talked about getting out of bed. [name] endorses it makes her very tired. [name] endorses at present time she is very  tired. We talked about energy conservation. We talked about importance of position changes, mobility. We talked about weakness, slow progression.  We talked about symptoms of pain and shortness of breath what she is not experiencing. We talked about appetite. [name] endorses  that she did eat breakfast this morning though the food was not as tasteful. Medical review completed. We talked about role of Palliative care visit. Discuss with [name] and [name] will follow up in 2 weeks if needed or sooner should she declined, continuing to monitor and follow progression, symptoms, appetite and assist with the transition to long-term care. I updated nursing staff in the new changes that present time. Therapeutic listening, emotional support provided. [name] does appear comfortable. Questions answered to satisfaction.  Palliative Care was asked to help to continue to address goals of care.   CODE STATUS: full code PPS: 30% HOSPICE ELIGIBILITY/DIAGNOSIS: TBD  PAST MEDICAL HISTORY:  Past Medical History:  Diagnosis Date  . Acute on chronic diastolic CHF (congestive heart failure) ([place]) 06/22/2017  . Acute on chronic renal failure ([place]) 05/25/2011  . Adenocarcinoma of breast ([place]) 1996   Completed tamoxifen and had mastectomy.  . Aortic stenosis, severe    s/p aortic valve replacement with porcine valve 06/2004.  ECHO 2010 EF 31%, LVH, diastolic dysfxn, Bioprostetic aoritc valve, mild AS. ECHO 2013 EF 60%, Nl aortic artificial valve, dynamic obstruction in the outflow tract   Class IIb rec for annual TTE after 5 yrs. She had a TTE 2013    . Arthritis   . CAD (coronary artery disease) 2006   s/p CABG (5/06) w/ saphenous vein to RCA at time of AVR  . CKD (chronic kidney disease) stage 2, GFR 60-89 ml/min 06/03/2011   She is no longer taking NSAIDs.   Marland Kitchen CVA (cerebral infarction) 2006   Post-op from AVR. Presumed embolic in nature. Carotid stenosis of R 60-79%. Repeat dopplers 4/10 no R stenosis and L stenosis of 1-29%.  . Depression    Controlled on Paxil  . Diabetes mellitus 1992   Dx 04/25/1990. Now insulin dependent, started 2008. On ACEI.    . Diverticulosis 2001  . History of 2019 novel coronavirus disease (COVID-19)   . Hyperlipidemia    Mgmt with a statin  . Hypertension    Requires 4 drug tx  . Osteoporosis 2006   DEXA 10/06 : L femur T -2.8, R -2.7. Lumbar T -2.4. On bisphosphonates and  Calcium / Vit D.  . Peripheral vascular disease ([place])   . Presence of permanent cardiac pacemaker   . Refusal of blood transfusions as patient is Jehovah's Witness   . Stroke [facility])     SOCIAL HX:  Social History   Tobacco Use  . Smoking status: Never Smoker  . Smokeless tobacco: Never Used  Substance Use Topics  . Alcohol use: Yes    Alcohol/week: 0.0 standard drinks    Comment: Occasional beer, monthly    ALLERGIES:  Allergies  Allergen Reactions  . Doxycycline     Made her stopped eating   . 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," as the patient is a Jehovah's Witness      PHYSICAL EXAM:   General: debilitated chronically ill, pleasant female Cardiovascular: regular rate and rhythm Pulmonary: clear ant fields Neurological: functional quadriplegic  Baker, NP

## 2020-05-01 ENCOUNTER — Other Ambulatory Visit: Payer: Self-pay

## 2020-05-02 DIAGNOSIS — L89150 Pressure ulcer of sacral region, unstageable: Secondary | ICD-10-CM | POA: Diagnosis not present

## 2020-05-02 DIAGNOSIS — S91301A Unspecified open wound, right foot, initial encounter: Secondary | ICD-10-CM | POA: Diagnosis not present

## 2020-05-02 DIAGNOSIS — M6281 Muscle weakness (generalized): Secondary | ICD-10-CM | POA: Diagnosis not present

## 2020-05-03 ENCOUNTER — Telehealth: Payer: Self-pay | Admitting: Sports Medicine

## 2020-05-03 NOTE — Telephone Encounter (Signed)
Patients daughter called in stating mom was still has discharge leaking during the dressing changed, stated she called in March 1st regarding matter and didn't get response or follow up call. I offered to bring patient in today at 115 but they declined appointment stating the would have to give facility notice and not enough time for today, Please Advise    Best Contact for Patients daughter  [name]  [phone]

## 2020-05-03 NOTE — Telephone Encounter (Signed)
Done, thanks

## 2020-05-03 NOTE — Telephone Encounter (Signed)
Can you tell the daughter to have the facility to call me so I can discuss with them and give them orders. Thanks Dr. Cannon Kettle

## 2020-05-09 ENCOUNTER — Ambulatory Visit (INDEPENDENT_AMBULATORY_CARE_PROVIDER_SITE_OTHER): Payer: Medicare Other | Admitting: Infectious Diseases

## 2020-05-09 ENCOUNTER — Encounter: Payer: Self-pay | Admitting: Infectious Diseases

## 2020-05-09 ENCOUNTER — Other Ambulatory Visit: Payer: Self-pay

## 2020-05-09 ENCOUNTER — Emergency Department (HOSPITAL_COMMUNITY): Payer: Medicare Other

## 2020-05-09 ENCOUNTER — Inpatient Hospital Stay (HOSPITAL_COMMUNITY)
Admission: EM | Admit: 2020-05-09 | Discharge: 2020-05-22 | DRG: 871 | Disposition: A | Discharge status: Home Health | Payer: Medicare Other | Attending: Internal Medicine | Admitting: Internal Medicine

## 2020-05-09 DIAGNOSIS — L97419 Non-pressure chronic ulcer of right heel and midfoot with unspecified severity: Secondary | ICD-10-CM | POA: Diagnosis not present

## 2020-05-09 DIAGNOSIS — R770 Abnormality of albumin: Secondary | ICD-10-CM | POA: Diagnosis not present

## 2020-05-09 DIAGNOSIS — F0390 Unspecified dementia without behavioral disturbance: Secondary | ICD-10-CM | POA: Diagnosis present

## 2020-05-09 DIAGNOSIS — Z515 Encounter for palliative care: Secondary | ICD-10-CM

## 2020-05-09 DIAGNOSIS — X58XXXA Exposure to other specified factors, initial encounter: Secondary | ICD-10-CM | POA: Diagnosis present

## 2020-05-09 DIAGNOSIS — Z9012 Acquired absence of left breast and nipple: Secondary | ICD-10-CM

## 2020-05-09 DIAGNOSIS — E11621 Type 2 diabetes mellitus with foot ulcer: Secondary | ICD-10-CM | POA: Diagnosis present

## 2020-05-09 DIAGNOSIS — I5032 Chronic diastolic (congestive) heart failure: Secondary | ICD-10-CM | POA: Diagnosis present

## 2020-05-09 DIAGNOSIS — D638 Anemia in other chronic diseases classified elsewhere: Secondary | ICD-10-CM | POA: Diagnosis present

## 2020-05-09 DIAGNOSIS — Z66 Do not resuscitate: Secondary | ICD-10-CM | POA: Diagnosis not present

## 2020-05-09 DIAGNOSIS — E785 Hyperlipidemia, unspecified: Secondary | ICD-10-CM | POA: Diagnosis present

## 2020-05-09 DIAGNOSIS — M7989 Other specified soft tissue disorders: Secondary | ICD-10-CM | POA: Diagnosis not present

## 2020-05-09 DIAGNOSIS — R509 Fever, unspecified: Secondary | ICD-10-CM | POA: Diagnosis not present

## 2020-05-09 DIAGNOSIS — N183 Chronic kidney disease, stage 3 unspecified: Secondary | ICD-10-CM | POA: Diagnosis present

## 2020-05-09 DIAGNOSIS — Z952 Presence of prosthetic heart valve: Secondary | ICD-10-CM

## 2020-05-09 DIAGNOSIS — G928 Other toxic encephalopathy: Secondary | ICD-10-CM | POA: Diagnosis present

## 2020-05-09 DIAGNOSIS — N390 Urinary tract infection, site not specified: Secondary | ICD-10-CM | POA: Diagnosis present

## 2020-05-09 DIAGNOSIS — Z9221 Personal history of antineoplastic chemotherapy: Secondary | ICD-10-CM

## 2020-05-09 DIAGNOSIS — M722 Plantar fascial fibromatosis: Secondary | ICD-10-CM | POA: Diagnosis present

## 2020-05-09 DIAGNOSIS — Z7401 Bed confinement status: Secondary | ICD-10-CM | POA: Diagnosis not present

## 2020-05-09 DIAGNOSIS — J9811 Atelectasis: Secondary | ICD-10-CM | POA: Diagnosis not present

## 2020-05-09 DIAGNOSIS — Y846 Urinary catheterization as the cause of abnormal reaction of the patient, or of later complication, without mention of misadventure at the time of the procedure: Secondary | ICD-10-CM | POA: Diagnosis present

## 2020-05-09 DIAGNOSIS — T68XXXA Hypothermia, initial encounter: Secondary | ICD-10-CM

## 2020-05-09 DIAGNOSIS — Z923 Personal history of irradiation: Secondary | ICD-10-CM

## 2020-05-09 DIAGNOSIS — Z20822 Contact with and (suspected) exposure to covid-19: Secondary | ICD-10-CM | POA: Diagnosis present

## 2020-05-09 DIAGNOSIS — I2510 Atherosclerotic heart disease of native coronary artery without angina pectoris: Secondary | ICD-10-CM | POA: Diagnosis not present

## 2020-05-09 DIAGNOSIS — Z794 Long term (current) use of insulin: Secondary | ICD-10-CM

## 2020-05-09 DIAGNOSIS — D649 Anemia, unspecified: Secondary | ICD-10-CM

## 2020-05-09 DIAGNOSIS — L03115 Cellulitis of right lower limb: Secondary | ICD-10-CM | POA: Diagnosis present

## 2020-05-09 DIAGNOSIS — Z90710 Acquired absence of both cervix and uterus: Secondary | ICD-10-CM

## 2020-05-09 DIAGNOSIS — T83511A Infection and inflammatory reaction due to indwelling urethral catheter, initial encounter: Secondary | ICD-10-CM | POA: Diagnosis present

## 2020-05-09 DIAGNOSIS — Z8249 Family history of ischemic heart disease and other diseases of the circulatory system: Secondary | ICD-10-CM

## 2020-05-09 DIAGNOSIS — I130 Hypertensive heart and chronic kidney disease with heart failure and stage 1 through stage 4 chronic kidney disease, or unspecified chronic kidney disease: Secondary | ICD-10-CM | POA: Diagnosis present

## 2020-05-09 DIAGNOSIS — E871 Hypo-osmolality and hyponatremia: Secondary | ICD-10-CM | POA: Diagnosis not present

## 2020-05-09 DIAGNOSIS — Z4682 Encounter for fitting and adjustment of non-vascular catheter: Secondary | ICD-10-CM | POA: Diagnosis not present

## 2020-05-09 DIAGNOSIS — L89614 Pressure ulcer of right heel, stage 4: Secondary | ICD-10-CM | POA: Diagnosis present

## 2020-05-09 DIAGNOSIS — Z833 Family history of diabetes mellitus: Secondary | ICD-10-CM

## 2020-05-09 DIAGNOSIS — I10 Essential (primary) hypertension: Secondary | ICD-10-CM | POA: Diagnosis present

## 2020-05-09 DIAGNOSIS — G9340 Encephalopathy, unspecified: Secondary | ICD-10-CM | POA: Diagnosis not present

## 2020-05-09 DIAGNOSIS — Z853 Personal history of malignant neoplasm of breast: Secondary | ICD-10-CM

## 2020-05-09 DIAGNOSIS — E11649 Type 2 diabetes mellitus with hypoglycemia without coma: Secondary | ICD-10-CM | POA: Diagnosis present

## 2020-05-09 DIAGNOSIS — Z841 Family history of disorders of kidney and ureter: Secondary | ICD-10-CM

## 2020-05-09 DIAGNOSIS — L89153 Pressure ulcer of sacral region, stage 3: Secondary | ICD-10-CM

## 2020-05-09 DIAGNOSIS — Z993 Dependence on wheelchair: Secondary | ICD-10-CM

## 2020-05-09 DIAGNOSIS — E1151 Type 2 diabetes mellitus with diabetic peripheral angiopathy without gangrene: Secondary | ICD-10-CM | POA: Diagnosis present

## 2020-05-09 DIAGNOSIS — S91301A Unspecified open wound, right foot, initial encounter: Secondary | ICD-10-CM

## 2020-05-09 DIAGNOSIS — M810 Age-related osteoporosis without current pathological fracture: Secondary | ICD-10-CM | POA: Diagnosis present

## 2020-05-09 DIAGNOSIS — M47816 Spondylosis without myelopathy or radiculopathy, lumbar region: Secondary | ICD-10-CM | POA: Diagnosis not present

## 2020-05-09 DIAGNOSIS — E8809 Other disorders of plasma-protein metabolism, not elsewhere classified: Secondary | ICD-10-CM | POA: Diagnosis not present

## 2020-05-09 DIAGNOSIS — I639 Cerebral infarction, unspecified: Secondary | ICD-10-CM | POA: Diagnosis not present

## 2020-05-09 DIAGNOSIS — Z8673 Personal history of transient ischemic attack (TIA), and cerebral infarction without residual deficits: Secondary | ICD-10-CM

## 2020-05-09 DIAGNOSIS — Z6831 Body mass index (BMI) 31.0-31.9, adult: Secondary | ICD-10-CM | POA: Diagnosis not present

## 2020-05-09 DIAGNOSIS — E43 Unspecified severe protein-calorie malnutrition: Secondary | ICD-10-CM | POA: Diagnosis present

## 2020-05-09 DIAGNOSIS — N1831 Chronic kidney disease, stage 3a: Secondary | ICD-10-CM | POA: Diagnosis present

## 2020-05-09 DIAGNOSIS — R601 Generalized edema: Secondary | ICD-10-CM

## 2020-05-09 DIAGNOSIS — L89613 Pressure ulcer of right heel, stage 3: Secondary | ICD-10-CM | POA: Diagnosis present

## 2020-05-09 DIAGNOSIS — M5126 Other intervertebral disc displacement, lumbar region: Secondary | ICD-10-CM | POA: Diagnosis not present

## 2020-05-09 DIAGNOSIS — G459 Transient cerebral ischemic attack, unspecified: Secondary | ICD-10-CM | POA: Diagnosis not present

## 2020-05-09 DIAGNOSIS — E1169 Type 2 diabetes mellitus with other specified complication: Secondary | ICD-10-CM | POA: Diagnosis present

## 2020-05-09 DIAGNOSIS — D696 Thrombocytopenia, unspecified: Secondary | ICD-10-CM | POA: Diagnosis present

## 2020-05-09 DIAGNOSIS — E876 Hypokalemia: Secondary | ICD-10-CM | POA: Diagnosis not present

## 2020-05-09 DIAGNOSIS — Z4659 Encounter for fitting and adjustment of other gastrointestinal appliance and device: Secondary | ICD-10-CM | POA: Diagnosis not present

## 2020-05-09 DIAGNOSIS — E878 Other disorders of electrolyte and fluid balance, not elsewhere classified: Secondary | ICD-10-CM | POA: Diagnosis not present

## 2020-05-09 DIAGNOSIS — F32A Depression, unspecified: Secondary | ICD-10-CM | POA: Diagnosis present

## 2020-05-09 DIAGNOSIS — F419 Anxiety disorder, unspecified: Secondary | ICD-10-CM | POA: Diagnosis present

## 2020-05-09 DIAGNOSIS — R627 Adult failure to thrive: Secondary | ICD-10-CM | POA: Diagnosis present

## 2020-05-09 DIAGNOSIS — E162 Hypoglycemia, unspecified: Secondary | ICD-10-CM | POA: Diagnosis not present

## 2020-05-09 DIAGNOSIS — R6520 Severe sepsis without septic shock: Secondary | ICD-10-CM | POA: Diagnosis not present

## 2020-05-09 DIAGNOSIS — Z8616 Personal history of COVID-19: Secondary | ICD-10-CM | POA: Diagnosis not present

## 2020-05-09 DIAGNOSIS — E1122 Type 2 diabetes mellitus with diabetic chronic kidney disease: Secondary | ICD-10-CM | POA: Diagnosis present

## 2020-05-09 DIAGNOSIS — M869 Osteomyelitis, unspecified: Secondary | ICD-10-CM | POA: Diagnosis present

## 2020-05-09 DIAGNOSIS — Z950 Presence of cardiac pacemaker: Secondary | ICD-10-CM

## 2020-05-09 DIAGNOSIS — A419 Sepsis, unspecified organism: Secondary | ICD-10-CM | POA: Diagnosis not present

## 2020-05-09 DIAGNOSIS — T148XXA Other injury of unspecified body region, initial encounter: Secondary | ICD-10-CM | POA: Diagnosis not present

## 2020-05-09 DIAGNOSIS — M2550 Pain in unspecified joint: Secondary | ICD-10-CM | POA: Diagnosis not present

## 2020-05-09 DIAGNOSIS — Z9049 Acquired absence of other specified parts of digestive tract: Secondary | ICD-10-CM

## 2020-05-09 DIAGNOSIS — I509 Heart failure, unspecified: Secondary | ICD-10-CM | POA: Diagnosis not present

## 2020-05-09 DIAGNOSIS — Z7189 Other specified counseling: Secondary | ICD-10-CM

## 2020-05-09 DIAGNOSIS — R4182 Altered mental status, unspecified: Secondary | ICD-10-CM | POA: Diagnosis not present

## 2020-05-09 DIAGNOSIS — L97509 Non-pressure chronic ulcer of other part of unspecified foot with unspecified severity: Secondary | ICD-10-CM | POA: Diagnosis present

## 2020-05-09 DIAGNOSIS — M48061 Spinal stenosis, lumbar region without neurogenic claudication: Secondary | ICD-10-CM | POA: Diagnosis not present

## 2020-05-09 DIAGNOSIS — I5030 Unspecified diastolic (congestive) heart failure: Secondary | ICD-10-CM | POA: Diagnosis not present

## 2020-05-09 DIAGNOSIS — R0602 Shortness of breath: Secondary | ICD-10-CM

## 2020-05-09 DIAGNOSIS — E1165 Type 2 diabetes mellitus with hyperglycemia: Secondary | ICD-10-CM | POA: Diagnosis not present

## 2020-05-09 DIAGNOSIS — Z452 Encounter for adjustment and management of vascular access device: Secondary | ICD-10-CM

## 2020-05-09 DIAGNOSIS — M86171 Other acute osteomyelitis, right ankle and foot: Secondary | ICD-10-CM | POA: Diagnosis not present

## 2020-05-09 DIAGNOSIS — Z951 Presence of aortocoronary bypass graft: Secondary | ICD-10-CM

## 2020-05-09 DIAGNOSIS — Z818 Family history of other mental and behavioral disorders: Secondary | ICD-10-CM

## 2020-05-09 DIAGNOSIS — Z820 Family history of epilepsy and other diseases of the nervous system: Secondary | ICD-10-CM

## 2020-05-09 DIAGNOSIS — I517 Cardiomegaly: Secondary | ICD-10-CM | POA: Diagnosis not present

## 2020-05-09 DIAGNOSIS — Z953 Presence of xenogenic heart valve: Secondary | ICD-10-CM

## 2020-05-09 DIAGNOSIS — L89154 Pressure ulcer of sacral region, stage 4: Secondary | ICD-10-CM | POA: Diagnosis present

## 2020-05-09 DIAGNOSIS — E119 Type 2 diabetes mellitus without complications: Secondary | ICD-10-CM

## 2020-05-09 LAB — URINALYSIS, ROUTINE W REFLEX MICROSCOPIC
Bilirubin Urine: NEGATIVE
Glucose, UA: NEGATIVE mg/dL
Ketones, ur: NEGATIVE mg/dL
Nitrite: NEGATIVE
Protein, ur: 30 mg/dL — AB
Specific Gravity, Urine: 1.011 (ref 1.005–1.030)
WBC, UA: >50 WBC/hpf — ABNORMAL HIGH (ref 0–5)
pH: 5 (ref 5.0–8.0)

## 2020-05-09 LAB — LACTIC ACID, PLASMA
Lactic Acid, Venous: 1.9 mmol/L (ref 0.5–1.9)
Lactic Acid, Venous: 2.3 mmol/L (ref 0.5–1.9)

## 2020-05-09 LAB — COMPREHENSIVE METABOLIC PANEL
ALT: 24 U/L (ref 0–44)
AST: 43 U/L — ABNORMAL HIGH (ref 15–41)
Albumin: 1.2 g/dL — ABNORMAL LOW (ref 3.5–5.0)
Alkaline Phosphatase: 168 U/L — ABNORMAL HIGH (ref 38–126)
Anion gap: 7 (ref 5–15)
BUN: 14 mg/dL (ref 8–23)
CO2: 21 mmol/L — ABNORMAL LOW (ref 22–32)
Calcium: 7.7 mg/dL — ABNORMAL LOW (ref 8.9–10.3)
Chloride: 109 mmol/L (ref 98–111)
Creatinine, Ser: 1.28 mg/dL — ABNORMAL HIGH (ref 0.44–1.00)
GFR, Estimated: 43 mL/min — ABNORMAL LOW (ref >60)
Glucose, Bld: 34 mg/dL — CL (ref 70–99)
Potassium: 3.7 mmol/L (ref 3.5–5.1)
Sodium: 137 mmol/L (ref 135–145)
Total Bilirubin: 0.8 mg/dL (ref 0.3–1.2)
Total Protein: 5.6 g/dL — ABNORMAL LOW (ref 6.5–8.1)

## 2020-05-09 LAB — CBC WITH DIFFERENTIAL/PLATELET
Abs Immature Granulocytes: 0.1 10*3/uL — ABNORMAL HIGH (ref 0.00–0.07)
Basophils Absolute: 0 10*3/uL (ref 0.0–0.1)
Basophils Relative: 0 %
Eosinophils Absolute: 0 10*3/uL (ref 0.0–0.5)
Eosinophils Relative: 0 %
HCT: 29.8 % — ABNORMAL LOW (ref 36.0–46.0)
Hemoglobin: 9.6 g/dL — ABNORMAL LOW (ref 12.0–15.0)
Immature Granulocytes: 1 %
Lymphocytes Relative: 11 %
Lymphs Abs: 1.3 10*3/uL (ref 0.7–4.0)
MCH: 30.6 pg (ref 26.0–34.0)
MCHC: 32.2 g/dL (ref 30.0–36.0)
MCV: 94.9 fL (ref 80.0–100.0)
Monocytes Absolute: 0.6 10*3/uL (ref 0.1–1.0)
Monocytes Relative: 6 %
Neutro Abs: 9.2 10*3/uL — ABNORMAL HIGH (ref 1.7–7.7)
Neutrophils Relative %: 82 %
Platelets: 215 10*3/uL (ref 150–400)
RBC: 3.14 MIL/uL — ABNORMAL LOW (ref 3.87–5.11)
RDW: 17.4 % — ABNORMAL HIGH (ref 11.5–15.5)
WBC: 11.2 10*3/uL — ABNORMAL HIGH (ref 4.0–10.5)
nRBC: 0.3 % — ABNORMAL HIGH (ref 0.0–0.2)

## 2020-05-09 LAB — CBG MONITORING, ED
Glucose-Capillary: 29 mg/dL — CL (ref 70–99)
Glucose-Capillary: 178 mg/dL — ABNORMAL HIGH (ref 70–99)
Glucose-Capillary: 95 mg/dL (ref 70–99)
Glucose-Capillary: 102 mg/dL — ABNORMAL HIGH (ref 70–99)

## 2020-05-09 LAB — PROTIME-INR
INR: 1.2 (ref 0.8–1.2)
Prothrombin Time: 14.5 seconds (ref 11.4–15.2)

## 2020-05-09 LAB — RESP PANEL BY RT-PCR (FLU A&B, COVID) ARPGX2
Influenza A by PCR: NEGATIVE
Influenza B by PCR: NEGATIVE
SARS Coronavirus 2 by RT PCR: NEGATIVE

## 2020-05-09 LAB — TROPONIN I (HIGH SENSITIVITY)
Troponin I (High Sensitivity): 29 ng/L — ABNORMAL HIGH (ref <18)
Troponin I (High Sensitivity): 26 ng/L — ABNORMAL HIGH (ref <18)

## 2020-05-09 LAB — APTT: aPTT: 36 seconds (ref 24–36)

## 2020-05-09 MED ORDER — ENOXAPARIN SODIUM 40 MG/0.4ML ~~LOC~~ SOLN
40.0000 mg | Freq: Every day | SUBCUTANEOUS | Status: DC
  Administered 2020-05-09 – 2020-05-15 (×7): 40 mg via SUBCUTANEOUS
  Filled 2020-05-09 (×7): qty 0.4

## 2020-05-09 MED ORDER — LACTATED RINGERS IV BOLUS
750.0000 mL | Freq: Once | INTRAVENOUS | Status: AC
  Administered 2020-05-09: 750 mL via INTRAVENOUS

## 2020-05-09 MED ORDER — ACETAMINOPHEN 325 MG PO TABS
650.0000 mg | ORAL_TABLET | Freq: Four times a day (QID) | ORAL | Status: DC | PRN
Start: 2020-05-09 — End: 2020-05-17
  Administered 2020-05-10 – 2020-05-17 (×7): 650 mg via ORAL
  Filled 2020-05-09 (×7): qty 2

## 2020-05-09 MED ORDER — DEXTROSE 10 % IV SOLN: INTRAVENOUS | Status: DC

## 2020-05-09 MED ORDER — ACETAMINOPHEN 650 MG RE SUPP: 650.0000 mg | Freq: Four times a day (QID) | RECTAL | Status: DC | PRN

## 2020-05-09 MED ORDER — VANCOMYCIN HCL 1000 MG/200ML IV SOLN
1000.0000 mg | Freq: Once | INTRAVENOUS | Status: AC
  Administered 2020-05-09: 1000 mg via INTRAVENOUS
  Filled 2020-05-09: qty 200

## 2020-05-09 MED ORDER — METRONIDAZOLE IN NACL 5-0.79 MG/ML-% IV SOLN
500.0000 mg | Freq: Once | INTRAVENOUS | Status: AC
  Administered 2020-05-09: 500 mg via INTRAVENOUS
  Filled 2020-05-09: qty 100

## 2020-05-09 MED ORDER — ONDANSETRON HCL 4 MG PO TABS: 4.0000 mg | ORAL_TABLET | Freq: Four times a day (QID) | ORAL | Status: DC | PRN

## 2020-05-09 MED ORDER — SODIUM CHLORIDE 0.9 % IV SOLN
2.0000 g | Freq: Two times a day (BID) | INTRAVENOUS | Status: AC
  Administered 2020-05-09 – 2020-05-11 (×4): 2 g via INTRAVENOUS
  Filled 2020-05-09 (×7): qty 2

## 2020-05-09 MED ORDER — ONDANSETRON HCL 4 MG/2ML IJ SOLN: 4.0000 mg | Freq: Four times a day (QID) | INTRAMUSCULAR | Status: DC | PRN

## 2020-05-09 MED ORDER — LACTATED RINGERS IV BOLUS
1000.0000 mL | Freq: Once | INTRAVENOUS | Status: AC
  Administered 2020-05-09: 1000 mL via INTRAVENOUS

## 2020-05-09 MED ORDER — DEXTROSE 50 % IV SOLN
INTRAVENOUS | Status: AC
  Filled 2020-05-09: qty 50

## 2020-05-09 MED ORDER — SODIUM CHLORIDE 0.9 % IV SOLN: 2.0000 g | Freq: Two times a day (BID) | INTRAVENOUS | Status: DC

## 2020-05-09 MED ORDER — SODIUM CHLORIDE 0.9 % IV SOLN
2.0000 g | Freq: Once | INTRAVENOUS | Status: AC
  Administered 2020-05-09: 2 g via INTRAVENOUS
  Filled 2020-05-09: qty 2

## 2020-05-09 MED ORDER — VANCOMYCIN HCL 1000 MG/200ML IV SOLN
1000.0000 mg | INTRAVENOUS | Status: DC
  Administered 2020-05-10: 1000 mg via INTRAVENOUS
  Filled 2020-05-09: qty 200

## 2020-05-09 NOTE — ED Provider Notes (Signed)
[name] Provider Note   CSN: [id number] Arrival date & time: 05/09/20  1816     History Chief Complaint  Patient presents with  . Altered Mental Status  . Hypoglycemia    [name] is a 75 y.o. female with multiple medical problems including History of adenocarcinoma of breast s/p chemotherapy and radiation, CVA, aortic stenosis s/p TAVR 03/01/2019. Aortic valve repair 2006, pacemaker place 01/2019, coronary artery disease, congestive heart failure, chronic kidney disease, hypertension, osteomyelitis of toe, sacral decubitus ulcer, hyperlipidemia, diabetes, depression, hypertension. Hospitalized 2 / 8 / 2022 to 2/16 / 2022 from skilled facility wheelchair-bound atbaseline with critical right lower extremity is schema s /p angiography recanalization ANT tibial artery balloon angioplasty on aspirin and Plavix. Workout significant for osteomyelitis right great toe, sacral decubitus and right heel ulcer with culture positive for morganella with adjusted antibiotics. General surgeryfor4 sacral ulcer and bedside debridement. Podiatry saw and placed on invanz with CT scan underwent debridement of first proximal flange slightly hypotensive but stabilized. [name] continues to reside at [name]. She is brought in today for altered mental status.  History is gathered at bedside from the patient's 2 daughters, 1 by telephone in 1 here in the room.  Patient's daughter states that she was out today going for her wound evaluation.  She had been more confused than normal and toward the end of the evening she became very lethargic.  They stopped at a [name] to try to get her some soda to see if her it would bring up her blood sugar.  She was unable to figure out how to use the [name] which is completely abnormal for her and EMS was called for transport.  Upon arrival the patient's blood sugar was found to be 29.   She was given D50 and her current blood sugar is 178.  Daughter states that she has had some intermittent confusion ever since she underwent her angiography with recanalization procedure however this is is absolutely different from normal.  A sacral decubitus and right heel ulcer.  She has a chronic indwelling Foley catheter.  HPI     Past Medical History:  Diagnosis Date  . Acute on chronic diastolic CHF (congestive heart failure) ([place]) 06/22/2017  . Acute on chronic renal failure ([place]) 05/25/2011  . Adenocarcinoma of breast ([place]) 1996   Completed tamoxifen and had mastectomy.  . Aortic stenosis, severe    s/p aortic valve replacement with porcine valve 06/2004.  ECHO 2010 EF 95%, LVH, diastolic dysfxn, Bioprostetic aoritc valve, mild AS. ECHO 2013 EF 60%, Nl aortic artificial valve, dynamic obstruction in the outflow tract   Class IIb rec for annual TTE after 5 yrs. She had a TTE 2013    . Arthritis   . CAD (coronary artery disease) 2006   s/p CABG (5/06) w/ saphenous vein to RCA at time of AVR  . CKD (chronic kidney disease) stage 2, GFR 60-89 ml/min 06/03/2011   She is no longer taking NSAIDs.   Marland Kitchen CVA (cerebral infarction) 2006   Post-op from AVR. Presumed embolic in nature. Carotid stenosis of R 60-79%. Repeat dopplers 4/10 no R stenosis and L stenosis of 1-29%.  . Depression    Controlled on Paxil  . Diabetes mellitus 1992   Dx 04/25/1990. Now insulin dependent, started 2008. On ACEI.   . Diverticulosis 2001  . History of 2019 novel coronavirus disease (COVID-19)   . Hyperlipidemia  Mgmt with a statin  . Hypertension    Requires 4 drug tx  . Osteoporosis 2006   DEXA 10/06 : L femur T -2.8, R -2.7. Lumbar T -2.4. On bisphosphonates and  Calcium / Vit D.  . Peripheral vascular disease (HCC)   . Presence of permanent cardiac pacemaker   . Refusal of blood transfusions as patient is Jehovah's Witness   . Stroke [facility])     Patient Active Problem List   Diagnosis Date Noted  .  Open wound of heel 04/03/2020  . Bleeding 01/27/2020  . CKD (chronic kidney disease) stage 3, GFR 30-59 ml/min (HCC) 01/27/2020  . Critical lower limb ischemia (HCC) 01/04/2020  . Skin pustule 10/20/2019  . Neurocognitive deficits 08/04/2019  . Controlled type 2 diabetes mellitus without complication (HCC) 07/23/2019  . CVA (cerebral vascular accident) (HCC) 07/23/2019  . Fever   . History of transcatheter aortic valve replacement (TAVR)   . Sacral decubitus ulcer, stage III (HCC) 07/18/2019  . Complete heart block (HCC)   . Chronic diastolic congestive heart failure (HCC) 02/07/2019  . AKI (acute kidney injury) (HCC) 07/22/2017  . Peripheral arterial disease (HCC) 06/18/2017  . B12 deficiency 05/01/2017  . Bilateral lower extremity edema 11/08/2015  . Aortic atherosclerosis (HCC) 06/07/2014  . Abnormality of gait 05/29/2012  . Constipation 03/16/2012  . HOCM (hypertrophic obstructive cardiomyopathy) (HCC) 06/11/2011  . Routine health maintenance 06/10/2010  . Hyperlipidemia   . Refusal of blood transfusions as patient is Jehovah's Witness   . History of adenocarcinoma of breast   . Osteoporosis   . Status post CVA   . Aortic stenosis s/p Tissure AVR 2006   . CAD (coronary artery disease)   . Hypertension   . Depression 01/23/2006  . DM (diabetes mellitus) (HCC) 03/25/1990  . Benign hypertensive heart and kidney disease with diastolic CHF, NYHA class 3 and CKD stage 3 (HCC) 1992  . Bradycardia 1992    Past Surgical History:  Procedure Laterality Date  . ABDOMINAL HYSTERECTOMY  1987   for fibroids  . AORTIC VALVE REPLACEMENT  2006  . BONE BIOPSY Right 04/06/2020   Procedure: BONE BIOPSY;  Surgeon: Asencion Islam, DPM;  Location: [facility] ORS;  Service: Podiatry;  Laterality: Right;  . CHOLECYSTECTOMY    . CORONARY ARTERY BYPASS GRAFT  2006   Saphenous vein to RCA at time of AVR. Course complicated by acute respiratory failure, post-op PTX, ARI, ileus, CVA  . INCISION AND  DRAINAGE OF WOUND Right 04/06/2020   Procedure: IRRIGATION AND DEBRIDEMENT WOUND;  Surgeon: Asencion Islam, DPM;  Location: [facility] ORS;  Service: Podiatry;  Laterality: Right;  . IR FLUORO GUIDE CV LINE RIGHT  04/11/2020  . IR US GUIDE VASC ACCESS RIGHT  04/11/2020  . LOWER EXTREMITY ANGIOGRAPHY N/A 01/04/2020   Procedure: LOWER EXTREMITY ANGIOGRAPHY - Right;  Surgeon: Leonie Douglas, MD;  Location: [facility] INVASIVE CV LAB;  Service: Cardiovascular;  Laterality: N/A;  . MASTECTOMY Left 1995   L for adenocarcinoma  . PACEMAKER IMPLANT N/A 02/09/2019   Procedure: PACEMAKER IMPLANT;  Surgeon: Regan Lemming, MD;  Location: [facility] INVASIVE CV LAB;  Service: Cardiovascular;  Laterality: N/A;  . PERIPHERAL VASCULAR BALLOON ANGIOPLASTY  01/04/2020   Procedure: PERIPHERAL VASCULAR BALLOON ANGIOPLASTY;  Surgeon: Leonie Douglas, MD;  Location: [facility] INVASIVE CV LAB;  Service: Cardiovascular;;  Rt AT  . RIGHT HEART CATH AND CORONARY/GRAFT ANGIOGRAPHY N/A 02/09/2019   Procedure: RIGHT HEART CATH AND CORONARY/GRAFT ANGIOGRAPHY;  Surgeon: Tonny Bollman, MD;  Location: [facility];  Service: Cardiovascular;  Laterality: N/A;  . TEE WITHOUT CARDIOVERSION N/A 07/20/2019   Procedure: TRANSESOPHAGEAL ECHOCARDIOGRAM (TEE);  Surgeon: Jerline Pain, MD;  Location: [facility] ENDOSCOPY;  Service: Cardiovascular;  Laterality: N/A;  . TRANSCATHETER AORTIC VALVE REPLACEMENT, TRANSFEMORAL N/A 03/01/2019   Procedure: TRANSCATHETER AORTIC VALVE REPLACEMENT, TRANSFEMORAL;  Surgeon: Sherren Mocha, MD;  Location: [place] CV LAB;  Service: Open Heart Surgery;  Laterality: N/A;     OB History   No obstetric history on file.     Family History  Problem Relation Age of Onset  . Diabetes Mother   . Hypertension Mother   . Alzheimer's disease Mother   . Heart disease Father 57       AMI at age 77 and 54  . Mental illness Sister   . Heart disease Sister 34       AMI  . Kidney disease Sister     Social History    Tobacco Use  . Smoking status: Never Smoker  . Smokeless tobacco: Never Used  Vaping Use  . Vaping Use: Never used  Substance Use Topics  . Alcohol use: Yes    Alcohol/week: 0.0 standard drinks    Comment: Occasional beer, monthly  . Drug use: No    Home Medications Prior to Admission medications   Medication Sig Start Date End Date Taking? Authorizing Provider  acetaminophen (TYLENOL) 325 MG tablet Take 650 mg by mouth daily. Not to exceed 3000 mg in 24 hour per stranding order 07/28/19   [provider]  Amino Acids-Protein Hydrolys (FEEDING SUPPLEMENT, PRO-STAT SUGAR FREE 64,) LIQD Take 30 mLs by mouth in the morning and at bedtime.    [provider]  aspirin 81 MG EC tablet TAKE 1 TABLET (81 MG TOTAL) BY MOUTH DAILY. SWALLOW WHOLE. Patient taking differently: Take 81 mg by mouth daily. 09/25/18   Harvie Heck, MD  atorvastatin (LIPITOR) 80 MG tablet Take 1 tablet (80 mg total) by mouth at bedtime. 07/08/19   [name], MD  clopidogrel (PLAVIX) 75 MG tablet Take 1 tablet (75 mg total) by mouth daily with breakfast. Patient taking differently: Take 75 mg by mouth daily. 06/16/19   [name], MD  Darbepoetin Alfa (ARANESP) 40 MCG/0.4ML SOSY injection Inject 0.4 mLs (40 mcg total) into the skin once a week. 04/15/20   Shahmehdi, Valeria Batman, MD  diclofenac Sodium (VOLTAREN) 1 % GEL Apply 4 g topically 4 (four) times daily as needed (Pain). Apply to bilateral hips and knees    [provider]  DULoxetine (CYMBALTA) 30 MG capsule Take 30 mg by mouth daily.    [provider]  Ensure (ENSURE) Take 237 mLs by mouth in the morning and at bedtime. Strawberry if available    [provider]  ertapenem 500 mg in sodium chloride 0.9 % 50 mL Inject 500 mg into the vein daily. 04/12/20 05/18/20  Deatra James, MD  ferumoxytol 510 mg in sodium chloride 0.9 % 100 mL Inject 510 mg into the vein once a week. 04/15/20   Shahmehdi, Valeria Batman, MD   Insulin Degludec-Liraglutide (XULTOPHY) 100-3.6 UNIT-MG/ML SOPN Inject 10 Units into the skin daily.    [provider]  lactulose (CHRONULAC) 10 GM/15ML solution Take 20 g by mouth daily. 10/17/19   [provider]  melatonin 3 MG TABS tablet Take 6 mg by mouth every evening.    [provider]  metoprolol tartrate (LOPRESSOR) 25 MG tablet Take  12.5 mg by mouth 2 (two) times daily. 03/23/20   [provider]  Multiple Vitamins-Minerals (DECUBI-VITE) CAPS Take 1 capsule by mouth daily.    [provider]  NOVOLOG FLEXPEN 100 UNIT/ML FlexPen Inject 5 Units into the skin 2 (two) times daily. cbg >/= 150 03/27/20   [provider]  ondansetron (ZOFRAN) 4 MG tablet Take 4 mg by mouth every 12 (twelve) hours as needed for nausea or vomiting. 03/23/20   [provider]  white petrolatum (VASELINE) GEL Apply 1 application topically daily. Heels at bedtime    [provider]    Allergies    Doxycycline and Other  Review of Systems   Review of Systems Unable to review systems due to altered mental status Physical Exam Updated Vital Signs BP (!) 90/59   Pulse 63   Temp (!) 93.6 F (34.2 C) (Rectal)   Resp 12   Ht $R'5\' 6"'wK$  (1.676 m)   Wt 62.1 kg   SpO2 93%   BMI 22.10 kg/m   Physical Exam Vitals and nursing note reviewed.  Constitutional:      General: She is not in acute distress.    Appearance: She is well-developed. She is ill-appearing. She is not diaphoretic.  HENT:     Head: Normocephalic and atraumatic.     Mouth/Throat:     Mouth: Mucous membranes are dry.  Eyes:     General: No scleral icterus.    Extraocular Movements: Extraocular movements intact.     Conjunctiva/sclera: Conjunctivae normal.     Pupils: Pupils are equal, round, and reactive to light.  Cardiovascular:     Rate and Rhythm: Normal rate and regular rhythm.     Heart sounds: Normal heart sounds. No murmur heard. No friction rub. No gallop.       Comments: Pitting edema of the bilateral lower extremities Pitting edema of the bilateral upper extremities Periorbital edema Pulmonary:     Effort: Pulmonary effort is normal. No respiratory distress.     Breath sounds: Normal breath sounds.  Abdominal:     General: Bowel sounds are normal. There is no distension.     Palpations: Abdomen is soft. There is no mass.     Tenderness: There is no abdominal tenderness. There is no guarding.  Genitourinary:    Comments: Erythema and swelling of the vulva.  There is macerated intertriginous tissue breakdown with erythematous rash which is well-circumscribed and consistent with chronic candidal infection. Musculoskeletal:     Cervical back: Normal range of motion.  Skin:    General: Skin is warm and dry.     Comments: Stage IV decubitus ulcer of the sacrum, stage III decubitus ulcer of the right heel with staples around the margin and macerated versus necrotic tissue. Avulsion of the nail of the right fifth digit  Neurological:     GCS: GCS eye subscore is 3. GCS verbal subscore is 2. GCS motor subscore is 5.     ED Results / Procedures / Treatments   Labs (all labs ordered are listed, but only abnormal results are displayed) Labs Reviewed  CBG MONITORING, ED - Abnormal; Notable for the following components:      Result Value   Glucose-Capillary 29 (*)    All other components within normal limits  CBG MONITORING, ED - Abnormal; Notable for the following components:   Glucose-Capillary 178 (*)    All other components within normal limits  RESP PANEL BY RT-PCR (FLU A&B, COVID) ARPGX2  CULTURE,  BLOOD (ROUTINE X 2)  CULTURE, BLOOD (ROUTINE X 2)  URINE CULTURE  LACTIC ACID, PLASMA  LACTIC ACID, PLASMA  COMPREHENSIVE METABOLIC PANEL  CBC WITH DIFFERENTIAL/PLATELET  PROTIME-INR  APTT  URINALYSIS, ROUTINE W REFLEX MICROSCOPIC    EKG None  Radiology No results found.  Procedures .Critical Care Performed by: Arthor Captain,  PA-C Authorized by: Arthor Captain, PA-C   Critical care provider statement:    Critical care time (minutes):  45   Critical care time was exclusive of:  Separately billable procedures and treating other patients   Critical care was necessary to treat or prevent imminent or life-threatening deterioration of the following conditions:  Sepsis   Critical care was time spent personally by me on the following activities:  Discussions with consultants, evaluation of patient's response to treatment, examination of patient, ordering and performing treatments and interventions, ordering and review of laboratory studies, ordering and review of radiographic studies, pulse oximetry, re-evaluation of patient's condition, obtaining history from patient or surrogate and review of old charts BLADDER CATHETERIZATION  Date/Time: 05/09/2020 10:03 PM Performed by: Arthor Captain, PA-C Authorized by: Arthor Captain, PA-C   Consent:    Consent obtained:  Emergent situation   Risks discussed:  False passage, infection, urethral injury and incomplete procedure Universal protocol:    Patient identity confirmed:  Arm band Pre-procedure details:    Procedure purpose:  Diagnostic   Preparation: Patient was prepped and draped in usual sterile fashion   Procedure details:    Provider performed due to:  Nurse unavailable   Catheter insertion:  Indwelling   Catheter type:  Foley   Catheter size:  14 Fr   Bladder irrigation: no     Number of attempts:  1   Urine characteristics:  Mildly cloudy Post-procedure details:    Procedure completion:  Tolerated well, no immediate complications     Medications Ordered in ED Medications  ceFEPIme (MAXIPIME) 2 g in sodium chloride 0.9 % 100 mL IVPB (has no administration in time range)  metroNIDAZOLE (FLAGYL) IVPB 500 mg (has no administration in time range)  vancomycin (VANCOREADY) IVPB 1000 mg/200 mL (has no administration in time range)  dextrose 50 % solution (   Given 05/09/20 1832)    ED Course  I have reviewed the triage vital signs and the nursing notes.  Pertinent labs & imaging results that were available during my care of the patient were reviewed by me and considered in my medical decision making (see chart for details).  Clinical Course as of 05/09/20 2154  Wed May 09, 2020  1917 75 yo female coming from SNF, complicated medical hx, presenting to Ed with hypoglycemia, hypothermia.  Sepsis alert initiated.  CBG 29 on arrival  - pt given D50 here.  Somnolent on exam.  BP soft but stable.  Pending labs, rewarming, IV antibiotics BS. [MT]  2034 BP stabilizing, glucose okay on repeat. [MT]  2041 Total Protein(!): 5.6 [AH]  2041 Calcium(!): 7.7 [AH]  2041 Albumin(!): 1.2 [AH]  2130 Likely UTI as source.  Pt on BS antibiotics. [MT]    Clinical Course User Index [AH] Arthor Captain, PA-C [MT] Terald Sleeper, MD   MDM Rules/Calculators/A&P                         [id number] mental status VS:  Vitals:   05/09/20 2100 05/09/20 2102 05/09/20 2145 05/09/20 2151  BP: (!) 127/56  110/68   Pulse: 62  63  Resp: 13  10   Temp:  (!) 94 F (34.4 C)  (!) 93.6 F (34.2 C)  TempSrc:  Axillary  Rectal  SpO2: 100%  100%   Weight:      Height:        VV:OHYWVPX is gathered by family and emr. Previous records obtained and reviewed. DDX:The patient's complaint of ams involves an extensive number of diagnostic and treatment options, and is a complaint that carries with it a high risk of complications, morbidity, and potential mortality. Given the large differential diagnosis, medical decision making is of high complexity. The differential diagnosis for AMS is extensive and includes, but is not limited to: drug overdose - opioids, alcohol, sedatives, antipsychotics, drug withdrawal, others; Metabolic: hypoxia, hypoglycemia, hyperglycemia, hypercalcemia, hypernatremia, hyponatremia, uremia, hepatic encephalopathy, hypothyroidism, hyperthyroidism,  vitamin B12 or thiamine deficiency, carbon monoxide poisoning, Wilson's disease, Lactic acidosis, DKA/HHOS; Infectious: meningitis, encephalitis, bacteremia/sepsis, urinary tract infection, pneumonia, neurosyphilis; Structural: Space-occupying lesion, (brain tumor, subdural hematoma, hydrocephalus,); Vascular: stroke, subarachnoid hemorrhage, coronary ischemia, hypertensive encephalopathy, CNS vasculitis, thrombotic thrombocytopenic purpura, disseminated intravascular coagulation, hyperviscosity; Psychiatric: Schizophrenia, depression; Other: Seizure, hypothermia, heat stroke, ICU psychosis, dementia -"sundowning."   Labs: I ordered reviewed and interpreted labs which include  CBC which shows white count of 11.2 thousand, normocytic anemia, CMP with initial glucose of 34, creatinine of 1.28 slightly above baseline.  Calcium of 7.7, low protein and albumin levels, slightly elevated alkaline phosphatase.  Patient's respiratory panel is negative for Covid and influenza.  Urinalysis is positive for UTI after change of indwelling catheter. Lactic acid within normal limits multiple pending labs including troponin and TSH level Imaging: I ordered and reviewed images which included portable 1 view chest x-ray. I independently visualized and interpreted all imaging.There are no acute, significant findings on today's images. EKG: NSR 72 WITH LBBB UNCHANGED FORM PREVIOUS TRACINGS Consults: Case discussed with Dr. Alcario Drought for admission MDM: This is a 75 year old female here with altered mental status.  She is initially per glycemia.  She also has hypothermia.  Code sepsis initiated secondary to hypotension, hypothermia.  Her white blood cell count is moderately elevated.  Pressure sample from newly changed Foley catheter shows infection.  She is currently on Invanz via PICC line for her osteomyelitis.  She is hypothermic despite [employer] and have switched her to warm fluids.  She will need admission for suspected  sepsis. Patient disposition:The patient appears reasonably stabilized for admission considering the current resources, flow, and capabilities available in the ED at this time, and I doubt any other [facility] requiring further screening and/or treatment in the ED prior to admission.        . Final Clinical Impression(s) / ED Diagnoses Final diagnoses:  Hypothermia, initial encounter  Hypoglycemia  Urinary tract infection associated with indwelling urethral catheter, initial encounter ([place])  Hypocalcemia  Hypoalbuminemia  Anasarca  Toxic metabolic encephalopathy    Rx / DC Orders ED Discharge Orders    None       Margarita Mail, PA-C 05/09/20 2226    Wyvonnia Dusky, MD 05/09/20 2356

## 2020-05-09 NOTE — Assessment & Plan Note (Addendum)
Going into recent surgery, exposed bone in the setting of chronic wound with pathology indicating no acute osteo. Morganella morganii cultured from superficial drainage in January. No intraoperative cultures.  Treated x 6 weeks Invanz due to complete 3/25.  Wound bed has some adherent slough that needs to be removed likely mechanically since she has been getting santyl. Has yet to see podiatry for follow up. There are scattered sutures I suspect that were from her graft used intraoperatively that likely need to come out. I asked the SNF to please schedule FU this week. Discussed with her daughters the same.  Will stop antibiotics as planned and remove PICC.  FU if condition worsens with ID.

## 2020-05-09 NOTE — ED Notes (Signed)
Switched fluid bolus to warm fluid.

## 2020-05-09 NOTE — H&P (Signed)
History and Physical    [name] FMB:[id number] DOB: [birth year] DOA: 05/09/2020  PCP: Hendricks Limes, MD  Patient coming from: SNF  I have personally briefly reviewed patient's old medical records in [place]  Chief Complaint: AMS  HPI: [name] is a 75 y.o. female with medical history significant of CVA, AS s/p AVR in 2006, then TAVR in 2021, PPM 2020, CAD, CHF, CKD 3a.  Pt admitted to hospital 2/8 - 2/16 with critical ischemia of R leg and open ulcer of heel growing Morganella morganii on culture.  Other issues included Sacral decubitus ulcer, and urinary retention.  Had balloon angioplasty of leg performed; however, plavix, which she had been on PRIOR to the admission (for stroke prevention), was stopped secondary to anemia.  Pt is a Jehovah's witness.  Patient was discharged on Invanz through March 25th.  Since that time pt has been in SNF, continued to have very poor PO intake (ongoing for past 3 months per daughter), intermittent confusion.  Today she was at ID for wound eval.  This evening she became more confused than normal and very lethargic.  EMS called, BGL 29, pt brought to ED.   ED Course: Initially hypothermic with T 93.6, hypotensive with SBP in 80s.  Pt given D50, then put on D10 gtt.  UA concerning for UTI.  bair hugger placed.  WBC 11k.  Started on sepsis protocol and IVF bolus.  BP improved.  Started on cefepime + Vanc.  Hospitalist asked to admit.   Review of Systems: Unable to perform due to AMS.  Past Medical History:  Diagnosis Date  . Acute on chronic diastolic CHF (congestive heart failure) ([place]) 06/22/2017  . Acute on chronic renal failure ([place]) 05/25/2011  . Adenocarcinoma of breast ([place]) 1996   Completed tamoxifen and had mastectomy.  . Aortic stenosis, severe    s/p aortic valve replacement with porcine valve 06/2004.  ECHO 2010 EF 70%, LVH, diastolic dysfxn, Bioprostetic aoritc valve, mild AS. ECHO 2013 EF  60%, Nl aortic artificial valve, dynamic obstruction in the outflow tract   Class IIb rec for annual TTE after 5 yrs. She had a TTE 2013    . Arthritis   . CAD (coronary artery disease) 2006   s/p CABG (5/06) w/ saphenous vein to RCA at time of AVR  . CKD (chronic kidney disease) stage 2, GFR 60-89 ml/min 06/03/2011   She is no longer taking NSAIDs.   [place] CVA (cerebral infarction) 2006   Post-op from AVR. Presumed embolic in nature. Carotid stenosis of R 60-79%. Repeat dopplers 4/10 no R stenosis and L stenosis of 1-29%.  . Depression    Controlled on Paxil  . Diabetes mellitus 1992   Dx 04/25/1990. Now insulin dependent, started 2008. On ACEI.   . Diverticulosis 2001  . History of 2019 novel coronavirus disease (COVID-19)   . Hyperlipidemia    Mgmt with a statin  . Hypertension    Requires 4 drug tx  . Osteoporosis 2006   DEXA 10/06 : L femur T -2.8, R -2.7. Lumbar T -2.4. On bisphosphonates and  Calcium / Vit D.  . Peripheral vascular disease ([place])   . Presence of permanent cardiac pacemaker   . Refusal of blood transfusions as patient is Jehovah's Witness   . Stroke [facility])     Past Surgical History:  Procedure Laterality Date  . ABDOMINAL HYSTERECTOMY  1987   for fibroids  . AORTIC VALVE REPLACEMENT  2006  .  BONE BIOPSY Right 04/06/2020   Procedure: BONE BIOPSY;  Surgeon: Landis Martins, DPM;  Location: [facility];  Service: Podiatry;  Laterality: Right;  . CHOLECYSTECTOMY    . CORONARY ARTERY BYPASS GRAFT  2006   Saphenous vein to RCA at time of AVR. Course complicated by acute respiratory failure, post-op PTX, ARI, ileus, CVA  . INCISION AND DRAINAGE OF WOUND Right 04/06/2020   Procedure: IRRIGATION AND DEBRIDEMENT WOUND;  Surgeon: Landis Martins, DPM;  Location: [facility];  Service: Podiatry;  Laterality: Right;  . IR FLUORO GUIDE CV LINE RIGHT  04/11/2020  . IR US GUIDE VASC ACCESS RIGHT  04/11/2020  . LOWER EXTREMITY ANGIOGRAPHY N/A 01/04/2020   Procedure: LOWER EXTREMITY ANGIOGRAPHY  - Right;  Surgeon: Cherre Robins, MD;  Location: [place] CV LAB;  Service: Cardiovascular;  Laterality: N/A;  . MASTECTOMY Left 1995   L for adenocarcinoma  . PACEMAKER IMPLANT N/A 02/09/2019   Procedure: PACEMAKER IMPLANT;  Surgeon: Constance Haw, MD;  Location: [place] CV LAB;  Service: Cardiovascular;  Laterality: N/A;  . PERIPHERAL VASCULAR BALLOON ANGIOPLASTY  01/04/2020   Procedure: PERIPHERAL VASCULAR BALLOON ANGIOPLASTY;  Surgeon: Cherre Robins, MD;  Location: [place] CV LAB;  Service: Cardiovascular;;  Rt AT  . RIGHT HEART CATH AND CORONARY/GRAFT ANGIOGRAPHY N/A 02/09/2019   Procedure: RIGHT HEART CATH AND CORONARY/GRAFT ANGIOGRAPHY;  Surgeon: Sherren Mocha, MD;  Location: [place] CV LAB;  Service: Cardiovascular;  Laterality: N/A;  . TEE WITHOUT CARDIOVERSION N/A 07/20/2019   Procedure: TRANSESOPHAGEAL ECHOCARDIOGRAM (TEE);  Surgeon: Jerline Pain, MD;  Location[facility];  Service: Cardiovascular;  Laterality: N/A;  . TRANSCATHETER AORTIC VALVE REPLACEMENT, TRANSFEMORAL N/A 03/01/2019   Procedure: TRANSCATHETER AORTIC VALVE REPLACEMENT, TRANSFEMORAL;  Surgeon: Sherren Mocha, MD;  Location: [place] CV LAB;  Service: Open Heart Surgery;  Laterality: N/A;     reports that she has never smoked. She has never used smokeless tobacco. She reports current alcohol use. She reports that she does not use drugs.  Allergies  Allergen Reactions  . Doxycycline     Made her stopped eating   . 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," as the patient is a Jehovah's Witness    Family History  Problem Relation Age of Onset  . Diabetes Mother   . Hypertension Mother   . Alzheimer's disease Mother   . Heart disease Father 76       AMI at age 42 and 66  . Mental illness Sister   . Heart disease Sister 29       AMI  . Kidney disease Sister      Prior to Admission medications   Medication Sig Start Date End Date Taking? Authorizing Provider   acetaminophen (TYLENOL) 325 MG tablet Take 650 mg by mouth daily. Not to exceed 3000 mg in 24 hour per stranding order 07/28/19   [provider]  Amino Acids-Protein Hydrolys (FEEDING SUPPLEMENT, PRO-STAT SUGAR FREE 64,) LIQD Take 30 mLs by mouth in the morning and at bedtime.    [provider]  aspirin 81 MG EC tablet TAKE 1 TABLET (81 MG TOTAL) BY MOUTH DAILY. SWALLOW WHOLE. Patient taking differently: Take 81 mg by mouth daily. 09/25/18   Harvie Heck, MD  atorvastatin (LIPITOR) 80 MG tablet Take 1 tablet (80 mg total) by mouth at bedtime. 07/08/19   [name], MD  clopidogrel (PLAVIX) 75 MG tablet Take 1 tablet (75 mg total) by mouth daily with breakfast.  Patient taking differently: Take 75 mg by mouth daily. 06/16/19   [name], MD  Darbepoetin Alfa (ARANESP) 40 MCG/0.4ML SOSY injection Inject 0.4 mLs (40 mcg total) into the skin once a week. 04/15/20   Shahmehdi, Valeria Batman, MD  diclofenac Sodium (VOLTAREN) 1 % GEL Apply 4 g topically 4 (four) times daily as needed (Pain). Apply to bilateral hips and knees    [provider]  DULoxetine (CYMBALTA) 30 MG capsule Take 30 mg by mouth daily.    [provider]  Ensure (ENSURE) Take 237 mLs by mouth in the morning and at bedtime. Strawberry if available    [provider]  ertapenem 500 mg in sodium chloride 0.9 % 50 mL Inject 500 mg into the vein daily. 04/12/20 05/18/20  Deatra James, MD  ferumoxytol 510 mg in sodium chloride 0.9 % 100 mL Inject 510 mg into the vein once a week. 04/15/20   Shahmehdi, Valeria Batman, MD  Insulin Degludec-Liraglutide (XULTOPHY) 100-3.6 UNIT-MG/ML SOPN Inject 10 Units into the skin daily.    [provider]  lactulose (CHRONULAC) 10 GM/15ML solution Take 20 g by mouth daily. 10/17/19   [provider]  melatonin 3 MG TABS tablet Take 6 mg by mouth every evening.    [provider]  metoprolol tartrate (LOPRESSOR) 25 MG tablet Take  12.5 mg by mouth 2 (two) times daily. 03/23/20   [provider]  Multiple Vitamins-Minerals (DECUBI-VITE) CAPS Take 1 capsule by mouth daily.    [provider]  NOVOLOG FLEXPEN 100 UNIT/ML FlexPen Inject 5 Units into the skin 2 (two) times daily. cbg >/= 150 03/27/20   [provider]  ondansetron (ZOFRAN) 4 MG tablet Take 4 mg by mouth every 12 (twelve) hours as needed for nausea or vomiting. 03/23/20   [provider]  white petrolatum (VASELINE) GEL Apply 1 application topically daily. Heels at bedtime    [provider]    Physical Exam: Vitals:   05/09/20 2145 05/09/20 2151 05/09/20 2200 05/09/20 2215  BP: 110/68  125/72 113/72  Pulse: 63  62 61  Resp: $Remo'10  11 13  'lqKmj$ Temp:  (!) 93.6 F (34.2 C)    TempSrc:  Rectal    SpO2: 100%  100% 100%  Weight:      Height:        Constitutional: NAD, calm, comfortable Eyes: PERRL, lids and conjunctivae normal ENMT: Mucous membranes are moist. Posterior pharynx clear of any exudate or lesions.Normal dentition.  Neck: normal, supple, no masses, no thyromegaly Respiratory: clear to auscultation bilaterally, no wheezing, no crackles. Normal respiratory effort. No accessory muscle use.  Cardiovascular: Regular rate and rhythm, no murmurs / rubs / gallops. No extremity edema. 2+ pedal pulses. No carotid bruits.  Abdomen: no tenderness, no masses palpated. No hepatosplenomegaly. Bowel sounds positive.  Musculoskeletal: no clubbing / cyanosis. No joint deformity upper and lower extremities. Good ROM, no contractures. Normal muscle tone.  Skin: no rashes, lesions, ulcers. No induration Neurologic: CN 2-12 grossly intact. Sensation intact, DTR normal. Strength 5/5 in all 4.  Psychiatric: Awake, somewhat confused   Labs on Admission: I have personally reviewed following labs and imaging studies  CBC: Recent Labs  Lab 05/09/20 1906  WBC 11.2*  NEUTROABS 9.2*  HGB 9.6*  HCT 29.8*  MCV 94.9  PLT 315    Basic Metabolic Panel: Recent Labs  Lab 05/09/20 1906  NA 137  K 3.7  CL 109  CO2 21*  GLUCOSE 34*  BUN 14  CREATININE 1.28*  CALCIUM 7.7*   GFR: Estimated Creatinine Clearance: 35 mL/min (A) (by C-G formula based on SCr of 1.28 mg/dL (H)). Liver Function Tests: Recent Labs  Lab 05/09/20 1906  AST 43*  ALT 24  ALKPHOS 168*  BILITOT 0.8  PROT 5.6*  ALBUMIN 1.2*   No results for input(s): LIPASE, AMYLASE in the last 168 hours. No results for input(s): AMMONIA in the last 168 hours. Coagulation Profile: Recent Labs  Lab 05/09/20 1906  INR 1.2   Cardiac Enzymes: No results for input(s): CKTOTAL, CKMB, CKMBINDEX, TROPONINI in the last 168 hours. BNP (last 3 results) Recent Labs    08/11/19 1638 08/23/19 1701  PROBNP 2,473* 3,638*   HbA1C: No results for input(s): HGBA1C in the last 72 hours. CBG: Recent Labs  Lab 05/09/20 1823 05/09/20 1839 05/09/20 2015 05/09/20 2147  GLUCAP 29* 178* 95 102*   Lipid Profile: No results for input(s): CHOL, HDL, LDLCALC, TRIG, CHOLHDL, LDLDIRECT in the last 72 hours. Thyroid Function Tests: No results for input(s): TSH, T4TOTAL, FREET4, T3FREE, THYROIDAB in the last 72 hours. Anemia Panel: No results for input(s): VITAMINB12, FOLATE, FERRITIN, TIBC, IRON, RETICCTPCT in the last 72 hours. Urine analysis:    Component Value Date/Time   COLORURINE YELLOW 05/09/2020 1906   APPEARANCEUR CLOUDY (A) 05/09/2020 1906   APPEARANCEUR Clear 09/01/2018 1131   LABSPEC 1.011 05/09/2020 1906   PHURINE 5.0 05/09/2020 1906   GLUCOSEU NEGATIVE 05/09/2020 1906   GLUCOSEU NEG mg/dL 12/25/2006 2041   HGBUR MODERATE (A) 05/09/2020 1906   HGBUR trace-lysed 03/05/2010 0856   BILIRUBINUR NEGATIVE 05/09/2020 1906   BILIRUBINUR Negative 09/01/2018 1131   KETONESUR NEGATIVE 05/09/2020 1906   PROTEINUR 30 (A) 05/09/2020 1906   UROBILINOGEN 0.2 11/11/2011 1701   UROBILINOGEN 0.2 11/11/2011 1645   NITRITE NEGATIVE 05/09/2020 1906    LEUKOCYTESUR LARGE (A) 05/09/2020 1906    Radiological Exams on Admission: DG Chest Port 1 View  Result Date: 05/09/2020 CLINICAL DATA:  Lethargy and altered mental status. Questionable sepsis - evaluate for abnormality EXAM: PORTABLE CHEST 1 VIEW COMPARISON:  Radiograph 02/08/2020 FINDINGS: Right internal jugular central venous catheter tip at the atrial caval junction. Post median sternotomy and TAVR. Implanted loop recorder projects over the left chest wall. Lung volumes are low. Mild streaky atelectasis at the left greater than right lung base. No confluent consolidation. Stable heart size. Unchanged mediastinal contours with aortic atherosclerosis and tortuosity. Chronic cardiomegaly. No pulmonary edema. No large pleural effusion or pneumothorax. IMPRESSION: Low lung volumes with streaky atelectasis at the left greater than right lung base. Electronically Signed   By: Keith Rake M.D.   On: 05/09/2020 19:59    EKG: Independently reviewed.  Assessment/Plan Principal Problem:   Acute encephalopathy Active Problems:   DM2 (diabetes mellitus, type 2) (HCC)   Hypertension   Hypoglycemia   Benign hypertensive heart and kidney disease with diastolic CHF, NYHA class 3 and CKD stage 3 ([place])   Sacral decubitus ulcer, stage IV (HCC)   Sepsis (HCC)   Catheter-associated urinary tract infection (HCC)   Hypothermia   Decubitus ulcer of right heel, stage 3 ([place])    1. Acute encephalopathy - 1. Due to hypoglycemia, also suspect underlying sepsis may be playing a role. 2. Improved with pt waking up in ED now 2. Hypoglycemia - 1. D10 gtt for the moment 2. Q2H CBG checks 3. Holding insulin 4. Repeat BMP in AM 3. Sepsis - 1. Due to CAUTI most likely, DDx for  source also includes the sacral decubitus and the heel decubitus 2. Occurs despite being on invanz for the past month! 3. Broaden coverage to merrem + Vanc for the moment 4. BCx and UCx pending 5. Repeat CBC in AM 6. Foley changed  out in ED 4. Decubitus ulcers - 1. Wound care consult and ABx as above 5. HTN - 1. Med rec pending 2. Holding home meds in setting of sepsis with initial hypotension on ED presentation anyhow 6. Severe malnutrition - 1. With albumin of 1.2 causing anasarca 2. Anasarca only going to get worse with IVF resuscitation 3. Pt is Jehova's witness and refuses albumin (d/w daughter at bedside). 4. Getting nutrition consult in AM 7. Anemia - 1. Improved compared to last admit with HGB in the 9 range. 2. Daily CBC on DVT ppx while here  DVT prophylaxis: Lovenox Code Status: Full Family Communication: Daughter at bedside Disposition Plan: SNF after sepsis resolved and wounds addressed Consults called: None Admission status: Admit to inpatient  Severity of Illness: The appropriate patient status for this patient is INPATIENT. Inpatient status is judged to be reasonable and necessary in order to provide the required intensity of service to ensure the patient's safety. The patient's presenting symptoms, physical exam findings, and initial radiographic and laboratory data in the context of their chronic comorbidities is felt to place them at high risk for further clinical deterioration. Furthermore, it is not anticipated that the patient will be medically stable for discharge from the hospital within 2 midnights of admission. The following factors support the patient status of inpatient.   IP status due to: 1) sepsis and CAUTI with organism that is presumably resistant to invanz   * I certify that at the point of admission it is my clinical judgment that the patient will require inpatient hospital care spanning beyond 2 midnights from the point of admission due to high intensity of service, high risk for further deterioration and high frequency of surveillance required.*    Naz Denunzio M. DO [facility]  How to contact the [facility] Attending or Consulting provider [place] or covering  provider during after hours [place], for this patient?  1. Check the care team in [facility] and look for a) attending/consulting [facility] provider listed and b) the [facility] team listed 2. Log into [email or web address]  Amion Physician Scheduling and messaging for groups and whole hospitals  On call and physician scheduling software for group practices, residents, hospitalists and other medical providers for call, clinic, rotation and shift schedules. On[employer] is a hospital-wide system for scheduling doctors and paging doctors on call. EasyPlot is for scientific plotting and data analysis.  [email or web address]  and use [place]'s universal password to access. If you do not have the password, please contact the hospital operator.  3. Locate the [facility] provider you are looking for under [facility] and page to a number that you can be directly reached. 4. If you still have difficulty reaching the provider, please page the [facility] (Director on Call) for the Hospitalists listed on amion for assistance.  05/09/2020, 10:27 PM

## 2020-05-09 NOTE — Sepsis Progress Note (Signed)
Verified w/ bedside nurse that blood cx's were drawn prior to antibiotics given. Labs have not been charted yet d/t time restraints.

## 2020-05-09 NOTE — Patient Instructions (Addendum)
I called the facility and am awaiting a call back from the wound treatment nurse about an update from the sacral wound.   For now will finish out the IV antibiotics as planned through 3/25.   I gave the facility the number to Dr. Leeanne Rio office and made a request to get her there this week.  She is getting santyl once a day to her bottom and heel.  I also expressed my concern regarding recurrent infections if she cannot be kept clean and free of stool.   Follow up will be pending update from the wound care team as of now but getting her back to see podiatry is priority for ongoing management of non-healing wound on her heel.    To pass along to nursing home team -   I would recommend liberalizing her food intake and add in supplements between meals. Need to get her nutrition in a better place or these wounds will never heal.  Vitamin C and Zinc supplements to help with chronic wound healing if not being given.  Her heel needs to be floated off the bed at all times  Frequent turning off sacrum Air mattress overlay if able to get that Consider wound vac for sacral wound if clean and no concern for infection.  Would recommend a wound center to help follow up if at all possible

## 2020-05-09 NOTE — ED Notes (Signed)
Bair Hugger applied per PA.

## 2020-05-09 NOTE — Sepsis Progress Note (Signed)
[facility] is following the sepsis protocol.

## 2020-05-09 NOTE — Progress Notes (Addendum)
Pharmacy Antibiotic Note  [name] is a 75 y.o. female admitted on 05/09/2020 with worsening confusion concerning for infection of unknown source. Of note, patient has a complicated infectious history significant for osteomyelitis and infected decubitus ulcer. Pharmacy has been consulted for Cefepime and vancomycin dosing.  WBC 11.2, SCr 1.28.   Plan: -Cefepime 2 gm IV Q 12 hours -Vancomycin 1 gm IV Q 24 hours per traditional nomogram.  -Monitor CBC, renal fx, cultures and clinical progress -Vanc trough as indicated      Temp (24hrs), Avg:93.6 F (34.2 C), Min:93.6 F (34.2 C), Max:93.6 F (34.2 C)  No results for input(s): WBC, CREATININE, LATICACIDVEN, VANCOTROUGH, VANCOPEAK, VANCORANDOM, GENTTROUGH, GENTPEAK, GENTRANDOM, TOBRATROUGH, TOBRAPEAK, TOBRARND, AMIKACINPEAK, AMIKACINTROU, AMIKACIN in the last 168 hours.  CrCl cannot be calculated (Patient's most recent lab result is older than the maximum 21 days allowed.).    Allergies  Allergen Reactions  . Doxycycline     Made her stopped eating   . 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," as the patient is a Jehovah's Witness    Antimicrobials this admission: Cefepime 3/16>>  Vanc 3/16 >>  Metronidazole 3/16 >>   Dose adjustments this admission:  Microbiology results: 3/16 Blood >>  3/16 UCx >>   Thank you for allowing pharmacy to be a part of this patient's care.  Albertina Parr, PharmD., BCPS, BCCCP Clinical Pharmacist Please refer to [facility] for unit-specific pharmacist    Addendum: Now broadening gram negative coverage from cefepime to meropenem. Of note, patient has a open wound ulcer that was being treated with Invanz due to complete on 3/25. UA shows possible UTI. Start meropenem 2 gm IV Q 12 hours.  Albertina Parr, PharmD., BCPS, BCCCP Clinical Pharmacist Please refer to [facility] for unit-specific pharmacist

## 2020-05-09 NOTE — ED Triage Notes (Signed)
Pt arrives POV with daughter who states they were transported from patient's SNF to a doctor's appointment regarding pt's sacral and heel wounds for which she is receiving iv abx. Pt has been altered all day and they stopped at [employer] to get something to eat. Pt then would not eat or make motions to drink out of straw, so family brought her here. Pt unable to answer questions and is very lethargic in triage, CBG 29. Taken immediately to treatment room.

## 2020-05-09 NOTE — ED Notes (Addendum)
Recheck blood sugar after administration of D50 is 178. Pt is now arousable to speech. VS stable Continuous cardiac monitoring in place. Provider at bedside.

## 2020-05-09 NOTE — Progress Notes (Signed)
Patient: [name]  DOB: 12/25/1945 MRN: [id number] PCP: Hendricks Limes, MD    Subjective:  [name] is a 75 y.o. female here for hospital follow up.  Of note she has a history of TAVR and PPM --> previously had PPM extracted in 2021 for lead vegetation d/t GBS; new device (medtronic micra implant) since then.   She was hospitalized 2/08 for worsening of sacral wound and DFU with concern for osteomyelitis (exposed bone). Underwent debridement with Dr. Cannon Kettle on 2/11 on his right DFU at his heel >> op note reviewed, all non viable bone and tissue  was removed and sent for biopsy with Cytal wound graft placement. Large sacral wound measuring 7x6x2 cm with fat layer exposed (no bone visualized). General surgery consulted >> bedside debridement 2/10 and and recommended hydrotherapy + BID W-D santyl dressing changes.  Discharged to [facility] with tunneled PICC line.   Pathology revealed no acute osteomyelitis from heel bone. Culture drainage 02/2020 revealed polymicrobial gram stain and growth from morganella morganii. Dr. Juleen China saw her in the hospital and recommended Invanz x 6 weeks for presumed osteomyelitis treatment due to end 3/25.   She has not seen Podiatry yet as her SNF was not able to arrange transportation to the office after her daughter called with concerns. There is a phone note describing a call from the patient's daughter reporting discharge during the dressing change (actually called 2x 3/1 and 3/10). It seems that the discharge was described to be more bloody at that time.   Her daughter, [name] is here in person with her and face-timed her other sister who primarily accompanies with her to visits. There is significant concern about her not being cleaned up frequent enough. They describe sitting in her same diaper for 10+ hours and covered in stool/diarrhea. She has a urinary catheter in place due to her "retaining urine and not being able to void on her  own." It has been in for about a month. She hardly eats anything unless outside food is brought to her. Her daughter is asking if she will ever walk again after the infection is healed.   I spoke with her Nurse at the SNF - this was her first day. She goes to the "wound treatment nurse" and gets daily santyl. She thinks this is applied to her sacrum and her heel. Dressing on her foot shows a dated dressing from 3/15.  She has no description available about how her wound is doing. She had a BMP and CBC this week. No antibiotic dose today yet.    Review of Systems  Constitutional: Negative for chills and fever.  HENT: Negative for tinnitus.   Eyes: Negative for blurred vision and photophobia.  Respiratory: Negative for cough and sputum production.   Cardiovascular: Negative for chest pain.  Gastrointestinal: Negative for diarrhea, nausea and vomiting.  Genitourinary: Negative for dysuria.       Chronic indwelling urinary catheter.   Musculoskeletal: Positive for back pain (upper back ).  Skin: Negative for rash.  Neurological: Negative for headaches.    Past Medical History:  Diagnosis Date  . Acute on chronic diastolic CHF (congestive heart failure) ([place]) 06/22/2017  . Acute on chronic renal failure ([place]) 05/25/2011  . Adenocarcinoma of breast ([place]) 1996   Completed tamoxifen and had mastectomy.  . Aortic stenosis, severe    s/p aortic valve replacement with porcine valve 06/2004.  ECHO 2010 EF 13%, LVH, diastolic dysfxn, Bioprostetic aoritc valve,  mild AS. ECHO 2013 EF 60%, Nl aortic artificial valve, dynamic obstruction in the outflow tract   Class IIb rec for annual TTE after 5 yrs. She had a TTE 2013    . Arthritis   . CAD (coronary artery disease) 2006   s/p CABG (5/06) w/ saphenous vein to RCA at time of AVR  . CKD (chronic kidney disease) stage 2, GFR 60-89 ml/min 06/03/2011   She is no longer taking NSAIDs.   Marland Kitchen CVA (cerebral infarction) 2006   Post-op from AVR. Presumed embolic in  nature. Carotid stenosis of R 60-79%. Repeat dopplers 4/10 no R stenosis and L stenosis of 1-29%.  . Depression    Controlled on Paxil  . Diabetes mellitus 1992   Dx 04/25/1990. Now insulin dependent, started 2008. On ACEI.   . Diverticulosis 2001  . History of 2019 novel coronavirus disease (COVID-19)   . Hyperlipidemia    Mgmt with a statin  . Hypertension    Requires 4 drug tx  . Osteoporosis 2006   DEXA 10/06 : L femur T -2.8, R -2.7. Lumbar T -2.4. On bisphosphonates and  Calcium / Vit D.  . Peripheral vascular disease ([place])   . Presence of permanent cardiac pacemaker   . Refusal of blood transfusions as patient is Jehovah's Witness   . Stroke ([place])     No facility-administered medications prior to visit.   Outpatient Medications Prior to Visit  Medication Sig Dispense Refill  . acetaminophen (TYLENOL) 325 MG tablet Take 650 mg by mouth daily. Not to exceed 3000 mg in 24 hour per stranding order    . Amino Acids-Protein Hydrolys (FEEDING SUPPLEMENT, PRO-STAT SUGAR FREE 64,) LIQD Take 30 mLs by mouth in the morning and at bedtime.    Marland Kitchen aspirin 81 MG EC tablet TAKE 1 TABLET (81 MG TOTAL) BY MOUTH DAILY. SWALLOW WHOLE. (Patient taking differently: Take 81 mg by mouth daily.) 30 tablet 3  . atorvastatin (LIPITOR) 80 MG tablet Take 1 tablet (80 mg total) by mouth at bedtime. 90 tablet 3  . clopidogrel (PLAVIX) 75 MG tablet Take 1 tablet (75 mg total) by mouth daily with breakfast. (Patient taking differently: Take 75 mg by mouth daily.) 90 tablet 3  . Darbepoetin Alfa (ARANESP) 40 MCG/0.4ML SOSY injection Inject 0.4 mLs (40 mcg total) into the skin once a week. 8.4 mL 0  . diclofenac Sodium (VOLTAREN) 1 % GEL Apply 4 g topically 4 (four) times daily as needed (Pain). Apply to bilateral hips and knees    . DULoxetine (CYMBALTA) 30 MG capsule Take 30 mg by mouth daily.    . Ensure (ENSURE) Take 237 mLs by mouth in the morning and at bedtime. Strawberry if available    . ertapenem 500 mg  in sodium chloride 0.9 % 50 mL Inject 500 mg into the vein daily.    . ferumoxytol 510 mg in sodium chloride 0.9 % 100 mL Inject 510 mg into the vein once a week.    . Insulin Degludec-Liraglutide (XULTOPHY) 100-3.6 UNIT-MG/ML SOPN Inject 10 Units into the skin daily.    Marland Kitchen lactulose (CHRONULAC) 10 GM/15ML solution Take 20 g by mouth daily.    . melatonin 3 MG TABS tablet Take 6 mg by mouth every evening.    . metoprolol tartrate (LOPRESSOR) 25 MG tablet Take 12.5 mg by mouth 2 (two) times daily.    . Multiple Vitamins-Minerals (DECUBI-VITE) CAPS Take 1 capsule by mouth daily.    Marland Kitchen NOVOLOG FLEXPEN 100 UNIT/ML  FlexPen Inject 5 Units into the skin 2 (two) times daily. cbg >/= 150    . ondansetron (ZOFRAN) 4 MG tablet Take 4 mg by mouth every 12 (twelve) hours as needed for nausea or vomiting.    . white petrolatum (VASELINE) GEL Apply 1 application topically daily. Heels at bedtime       Allergies  Allergen Reactions  . Doxycycline     Made her stopped eating   . 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," as the patient is a Sales promotion account executive Witness    Social History   Tobacco Use  . Smoking status: Never Smoker  . Smokeless tobacco: Never Used  Vaping Use  . Vaping Use: Never used  Substance Use Topics  . Alcohol use: Yes    Alcohol/week: 0.0 standard drinks    Comment: Occasional beer, monthly  . Drug use: No    Family History  Problem Relation Age of Onset  . Diabetes Mother   . Hypertension Mother   . Alzheimer's disease Mother   . Heart disease Father 35       AMI at age 81 and 30  . Mental illness Sister   . Heart disease Sister 59       AMI  . Kidney disease Sister     Objective:  There were no vitals filed for this visit. There is no height or weight on file to calculate BMI.  Attempted several times for BP - not reading. Apical HR 58. Afebrile.   Physical Exam Vitals reviewed.  Constitutional:      Appearance: She is ill-appearing.     Comments: Elderly  woman seated in wheelchair hunched over. Cannot pick her head up. Cannot lift limbs to gravity.  She is awake and alert asking appropriate questions and participating in conversation.   HENT:     Mouth/Throat:     Mouth: Mucous membranes are moist.     Pharynx: Oropharynx is clear.  Cardiovascular:     Rate and Rhythm: Normal rate. Rhythm irregular.  Pulmonary:     Effort: Pulmonary effort is normal.     Breath sounds: Normal breath sounds.  Chest:    Abdominal:     General: Bowel sounds are normal.     Palpations: Abdomen is soft.  Musculoskeletal:     Comments: Unable to safely assess sacral wound.  R heel as pictured. Full thickness wound noted with pink/red wound bed, some bleeding, no odor and collagenous material in wound bed with some adherent yellow slough in 12 o'clock position There are several surrounding staples in place still that don't appear to be doing anything. ?if this was for graft used intraoperatively. Wound margins appear slightly macerated.           Lab Results: Lab Results  Component Value Date   WBC 11.2 (H) 05/09/2020   HGB 9.6 (L) 05/09/2020   HCT 29.8 (L) 05/09/2020   MCV 94.9 05/09/2020   PLT 215 05/09/2020    Lab Results  Component Value Date   CREATININE 1.28 (H) 05/09/2020   BUN 14 05/09/2020   NA 137 05/09/2020   K 3.7 05/09/2020   CL 109 05/09/2020   CO2 21 (L) 05/09/2020    Lab Results  Component Value Date   ALT 24 05/09/2020   AST 43 (H) 05/09/2020   GGT 29 04/04/2020   ALKPHOS 168 (H) 05/09/2020   BILITOT 0.8 05/09/2020     Assessment & Plan:   Problem List  Items Addressed This Visit      Unprioritized   Sacral decubitus ulcer, stage III ([place]) - Primary    No safe way to evaluate her wound today. Unfortunately after I called the SNF they have zero documentation about her wound progress, or lack thereof. I am awaiting a call back from the wound nurse to get an update. Requested CBC and BMP drawn this week to be sent  to our office.  CRP / ESR today. Nonspecific findings however and no baseline in the hospital with recent illness to compare. Will for now continue through end date for 6 weeks and arrange PICC to be d/c'd in IR. I don't want this in longer than necessary with her PPM and TAVR in place.   This is going to be a challenging thing to heal and prevent from becoming infected again. Discussed with her nurse at SNF that she needs more attentive toileting attention to clean her from stool. Urine currently contained with catheter, however that seems unhelpful given she is incontinent of diarrhea and not changed enough.   Recommended to increase nutrition supplementation with at least BID ensures/boosts between meals or whatever protein supplementation they can get her. Add vitamin C and zinc for wound healing support.   If her wound does not look grossly infected perhaps the facility could consider VAC placement to help with healing and granulation.   She is following with facility wound care team and physician recommendations. I strongly suspect she will require re-hospitalization soon given she will become infected again.       Relevant Orders   C-reactive protein   Sedimentation rate   Open wound of heel    Going into recent surgery, exposed bone in the setting of chronic wound with pathology indicating no acute osteo. Morganella morganii cultured from superficial drainage in January. No intraoperative cultures.  Treated x 6 weeks Invanz due to complete 3/25.  Wound bed has some adherent slough that needs to be removed likely mechanically since she has been getting santyl. Has yet to see podiatry for follow up. There are scattered sutures I suspect that were from her graft used intraoperatively that likely need to come out. I asked the SNF to please schedule FU this week. Discussed with her daughters the same.  Will stop antibiotics as planned and remove PICC.  FU if condition worsens with ID.         Other Visit Diagnoses    PICC (peripherally inserted central catheter) in place       Relevant Orders   IR REMOVAL TUN ACCESS W/ PORT W/O FL MOD SED      Janene Madeira, MSN, NP-C [facility] for [place] Pager: [phone] Office: [phone]  05/09/20  9:08 PM

## 2020-05-09 NOTE — Assessment & Plan Note (Addendum)
No safe way to evaluate her wound today. Unfortunately after I called the SNF they have zero documentation about her wound progress, or lack thereof. I am awaiting a call back from the wound nurse to get an update. Requested CBC and BMP drawn this week to be sent to our office.  CRP / ESR today. Nonspecific findings however and no baseline in the hospital with recent illness to compare. Will for now continue through end date for 6 weeks and arrange PICC to be d/c'd in IR. I don't want this in longer than necessary with her PPM and TAVR in place.   This is going to be a challenging thing to heal and prevent from becoming infected again. Discussed with her nurse at SNF that she needs more attentive toileting attention to clean her from stool. Urine currently contained with catheter, however that seems unhelpful given she is incontinent of diarrhea and not changed enough.   Recommended to increase nutrition supplementation with at least BID ensures/boosts between meals or whatever protein supplementation they can get her. Add vitamin C and zinc for wound healing support.   If her wound does not look grossly infected perhaps the facility could consider VAC placement to help with healing and granulation.   She is following with facility wound care team and physician recommendations. I strongly suspect she will require re-hospitalization soon given she will become infected again.

## 2020-05-09 NOTE — Sepsis Progress Note (Signed)
Notified provider of need to order additional  fluid bolus.

## 2020-05-10 DIAGNOSIS — G9340 Encephalopathy, unspecified: Secondary | ICD-10-CM | POA: Diagnosis not present

## 2020-05-10 DIAGNOSIS — M86171 Other acute osteomyelitis, right ankle and foot: Secondary | ICD-10-CM

## 2020-05-10 DIAGNOSIS — T68XXXA Hypothermia, initial encounter: Secondary | ICD-10-CM

## 2020-05-10 DIAGNOSIS — L89154 Pressure ulcer of sacral region, stage 4: Secondary | ICD-10-CM

## 2020-05-10 DIAGNOSIS — E1169 Type 2 diabetes mellitus with other specified complication: Secondary | ICD-10-CM | POA: Diagnosis not present

## 2020-05-10 DIAGNOSIS — E162 Hypoglycemia, unspecified: Secondary | ICD-10-CM | POA: Diagnosis not present

## 2020-05-10 DIAGNOSIS — T83511A Infection and inflammatory reaction due to indwelling urethral catheter, initial encounter: Secondary | ICD-10-CM | POA: Diagnosis not present

## 2020-05-10 DIAGNOSIS — Z794 Long term (current) use of insulin: Secondary | ICD-10-CM

## 2020-05-10 DIAGNOSIS — N390 Urinary tract infection, site not specified: Secondary | ICD-10-CM

## 2020-05-10 LAB — CBC
HCT: 23.3 % — ABNORMAL LOW (ref 36.0–46.0)
Hemoglobin: 7.5 g/dL — ABNORMAL LOW (ref 12.0–15.0)
MCH: 30.4 pg (ref 26.0–34.0)
MCHC: 32.2 g/dL (ref 30.0–36.0)
MCV: 94.3 fL (ref 80.0–100.0)
Platelets: 165 10*3/uL (ref 150–400)
RBC: 2.47 MIL/uL — ABNORMAL LOW (ref 3.87–5.11)
RDW: 17.4 % — ABNORMAL HIGH (ref 11.5–15.5)
WBC: 14.7 10*3/uL — ABNORMAL HIGH (ref 4.0–10.5)
nRBC: 0.1 % (ref 0.0–0.2)

## 2020-05-10 LAB — GLUCOSE, CAPILLARY
Glucose-Capillary: 118 mg/dL — ABNORMAL HIGH (ref 70–99)
Glucose-Capillary: 98 mg/dL (ref 70–99)
Glucose-Capillary: 101 mg/dL — ABNORMAL HIGH (ref 70–99)
Glucose-Capillary: 109 mg/dL — ABNORMAL HIGH (ref 70–99)

## 2020-05-10 LAB — BASIC METABOLIC PANEL
Anion gap: 6 (ref 5–15)
BUN: 12 mg/dL (ref 8–23)
CO2: 22 mmol/L (ref 22–32)
Calcium: 7.1 mg/dL — ABNORMAL LOW (ref 8.9–10.3)
Chloride: 108 mmol/L (ref 98–111)
Creatinine, Ser: 1.12 mg/dL — ABNORMAL HIGH (ref 0.44–1.00)
GFR, Estimated: 51 mL/min — ABNORMAL LOW (ref >60)
Glucose, Bld: 173 mg/dL — ABNORMAL HIGH (ref 70–99)
Potassium: 3.4 mmol/L — ABNORMAL LOW (ref 3.5–5.1)
Sodium: 136 mmol/L (ref 135–145)

## 2020-05-10 LAB — C-REACTIVE PROTEIN: CRP: 23 mg/L — ABNORMAL HIGH (ref <8.0)

## 2020-05-10 LAB — CBG MONITORING, ED
Glucose-Capillary: 152 mg/dL — ABNORMAL HIGH (ref 70–99)
Glucose-Capillary: 152 mg/dL — ABNORMAL HIGH (ref 70–99)

## 2020-05-10 LAB — SEDIMENTATION RATE: Sed Rate: 22 mm/h (ref 0–30)

## 2020-05-10 LAB — TSH: TSH: 1.851 u[IU]/mL (ref 0.350–4.500)

## 2020-05-10 MED ORDER — CHLORHEXIDINE GLUCONATE CLOTH 2 % EX PADS
6.0000 | MEDICATED_PAD | Freq: Every day | CUTANEOUS | Status: DC
  Administered 2020-05-11 – 2020-05-17 (×7): 6 via TOPICAL

## 2020-05-10 MED ORDER — COLLAGENASE 250 UNIT/GM EX OINT
TOPICAL_OINTMENT | Freq: Every day | CUTANEOUS | Status: DC
  Administered 2020-05-14 – 2020-05-20 (×3): 1 via TOPICAL
  Filled 2020-05-10 (×5): qty 30

## 2020-05-10 NOTE — Progress Notes (Signed)
Pt arrived to 4e from ED. CHG wipes used. Vitals obtained. Telemetry box placed and CCMD notified x2 verifiers. ID MD at bedside for assessment. Sacral dressing changed. Daughter at bedside. Air loss bed ordered.

## 2020-05-10 NOTE — ED Notes (Signed)
The pts daughter is concerned about a wound on her mothers rt foot  Surgery weeks ago supoased to have some type boot on it.  She aslo has skin breakdown from not being turned at her nursing home

## 2020-05-10 NOTE — Consult Note (Signed)
Date of Admission:  05/09/2020          Reason for Consult: Sepsis    Referring Provider: Dr. Alcario Drought  Assessment:  1. Hypotension 2. Transient hypotension 3. Hypoglycemia 4. DFU with osteomyelitis including possible calcaneal osteomyelitis sp surgery by podiatry 5. Deep acral decubitus ulcer 6. Bedbound status 7. Central line in place 8. 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 TAVR 10. DM 11. CHF 12. Dementia  Plan:  1. Ok to continue vancomycin and cefepime for now 2. Follow-up blood cultures  3. Would like to check MRI of the right foot with contrast (and device seems PM compatible)  4. WOC following sacral wound, considering imaging of that area    Principal Problem:   Acute encephalopathy Active Problems:   DM2 (diabetes mellitus, type 2) ([place])   Hypertension   Hypoglycemia   Benign hypertensive heart and kidney disease with diastolic CHF, NYHA class 3 and CKD stage 3 ([place])   Sacral decubitus ulcer, stage IV (HCC)   Sepsis (HCC)   Catheter-associated urinary tract infection ([place])   Hypothermia   Decubitus ulcer of right heel, stage 3 (HCC)   Scheduled Meds: . [START ON 05/11/2020] Chlorhexidine Gluconate Cloth  6 each Topical Daily  . collagenase   Topical Daily  . enoxaparin (LOVENOX) injection  40 mg Subcutaneous QHS   Continuous Infusions: . dextrose 125 mL/hr at 05/10/20 1053  . meropenem (MERREM) IV 2 g (05/10/20 1054)  . vancomycin     PRN Meds:.acetaminophen **OR** acetaminophen, ondansetron **OR** ondansetron (ZOFRAN) IV  HPI: [name] is a 75 y.o. female likely has underlying dementia is bedbound with large sacral decubitus ulcer, history pacemaker that became infected in context of group B streptococcus bacteremia.  Pacemaker was extracted in 2021 and new Medtronic Micra was placed.  He also underwent TAVR.  He was  hospitalized in February of this year with worsening of her sacral wound and a diabetic foot ulcer with concern for osteomyelitis with exposed bone.  T scan done at that time showed areas of cortical irregularity involving the first proximal and distal distal phalanges concerning for osteomyelitis as well as cortical regularity along the calcaneus.  There are chronic resorptive change of the distal phalanx thought to be possible due to chronic osteomyelitis along with posttraumatic deformity of the second proximal phalanx sclerotic and irregular changes in the hallucal sesamoidal complexes that could require flex also prior sesamoiditis.  Also extensive soft tissue swelling the foot  She had prior angioplasty on this leg performed.    Patient underwent surgery with Dr. Cannon Kettle who performed I&D of the right foot with removal of bone sent for biopsy pathology came back with benign bone and no evidence of osteomyelitis on pathology.  A graft was placed no cultures were done.  Patient previously grown a Morganella species on culture taken from heel.  General surgery also saw the patient and examined her 7 x 6 x 2 cm large sacral decubitus ulcer.  They performed bedside debridement on the 10th and recommended hydrotherapy along with Santyl dressing changes.  [facility]  Partner Dr. Verl Blalock saw the patient and recommended 6 weeks of IV ertapenem with stop date being 25 March.  She was seen yesterday by Janene Madeira, NP in our infectious disease clinic.  Apparently the daughter has been concerned about increasing discharge from  the patient's heel wound.  She had called podiatry regarding this.  She is also concerned about both wounds not being cleaned frequently and in particular that the sacral wound was not being protected sufficiently.   Colletta Maryland felt that the wound on the right appeared to be doing well and that the patient can complete  antibiotics and her tunneled PICC line could be removed.  Inflammatory markers have not did come down with her sed rate having come down from 93 to a value of 22.  CRP is elevated 23.  Patient apparently last night became encephalopathic  EMS was called and she was found to have a blood sugar of 29.  Apparently she was hypothermic in the ER to 93.6 with a blood pressure in the 80s but rapidly responded to fluid resuscitation.  Her UA showed some pyuria blood cultures were taken and she was started on vancomycin and cefepime.  Note she had still been on ertapenem at the time that this was all going on.  Patient herself is not able to provide much history and most of it is from her daughter in the room reading the chart.  It is possible that the hypoglycemia may have in itself precipitated her admission and may not be necessary related to infection.  I am bothered though by findings of osteomyelitis on imaging of the foot.  Based on the device type in my text with Dr. Curt Bears this pacemaker should be MRI compatible and I have ordered an MRI of the foot.  If we do not identify an infection that is going to need aggressive surgery and prolonged further antibiotics it would be prudent to remove her central line during her hospitalization but need to get clarity around why she was admitted to ensure that she is not acutely bacteremic or having progression of infection in her foot or in her sacral ulcer.    Review of Systems: Review of Systems  Unable to perform ROS: Dementia    Past Medical History:  Diagnosis Date  . Acute on chronic diastolic CHF (congestive heart failure) ([place]) 06/22/2017  . Acute on chronic renal failure ([place]) 05/25/2011  . Adenocarcinoma of breast ([place]) 1996   Completed tamoxifen and had mastectomy.  . Aortic stenosis, severe    s/p aortic valve replacement with porcine valve 06/2004.  ECHO 2010 EF 68%, LVH, diastolic dysfxn, Bioprostetic aoritc valve, mild AS. ECHO  2013 EF 60%, Nl aortic artificial valve, dynamic obstruction in the outflow tract   Class IIb rec for annual TTE after 5 yrs. She had a TTE 2013    . Arthritis   . CAD (coronary artery disease) 2006   s/p CABG (5/06) w/ saphenous vein to RCA at time of AVR  . CKD (chronic kidney disease) stage 2, GFR 60-89 ml/min 06/03/2011   She is no longer taking NSAIDs.   [place] CVA (cerebral infarction) 2006   Post-op from AVR. Presumed embolic in nature. Carotid stenosis of R 60-79%. Repeat dopplers 4/10 no R stenosis and L stenosis of 1-29%.  . Depression    Controlled on Paxil  . Diabetes mellitus 1992   Dx 04/25/1990. Now insulin dependent, started 2008. On ACEI.   . Diverticulosis 2001  . History of 2019 novel coronavirus disease (COVID-19)   . Hyperlipidemia    Mgmt with a statin  . Hypertension    Requires 4 drug tx  . Osteoporosis 2006   DEXA 10/06 : L femur T -2.8, R -2.7. Lumbar T -2.4. On bisphosphonates  and  Calcium / Vit D.  . Peripheral vascular disease ([place])   . Presence of permanent cardiac pacemaker   . Refusal of blood transfusions as patient is Jehovah's Witness   . Stroke [facility])     Social History   Tobacco Use  . Smoking status: Never Smoker  . Smokeless tobacco: Never Used  Vaping Use  . Vaping Use: Never used  Substance Use Topics  . Alcohol use: Yes    Alcohol/week: 0.0 standard drinks    Comment: Occasional beer, monthly  . Drug use: No    Family History  Problem Relation Age of Onset  . Diabetes Mother   . Hypertension Mother   . Alzheimer's disease Mother   . Heart disease Father 92       AMI at age 62 and 20  . Mental illness Sister   . Heart disease Sister 62       AMI  . Kidney disease Sister    Allergies  Allergen Reactions  . Doxycycline     Made her stopped eating   . 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," as the patient is a Jehovah's Witness    OBJECTIVE: Blood pressure (!) 156/83, pulse 71, temperature (!) 97.5 F (36.4 C),  temperature source Oral, resp. rate 18, height $RemoveBe'5\' 6"'mxXuRGwin$  (1.676 m), weight 62.1 kg, SpO2 100 %.  Physical Exam HENT:     Head: Normocephalic and atraumatic.  Eyes:     Extraocular Movements: Extraocular movements intact.  Cardiovascular:     Rate and Rhythm: Regular rhythm.     Heart sounds: Murmur heard.    Pulmonary:     Effort: Pulmonary effort is normal. No respiratory distress.     Breath sounds: No stridor. No wheezing or rhonchi.  Abdominal:     General: Abdomen is flat. Bowel sounds are normal. There is no distension.     Palpations: Abdomen is soft.  Skin:    General: Skin is warm.  Neurological:     General: No focal deficit present.     Mental Status: She is alert.    Central line May 10, 2020:  :    Decubitus ulcer pictured May 10, 2020:      Right foot May 10, 2020:       Lab Results Lab Results  Component Value Date   WBC 14.7 (H) 05/10/2020   HGB 7.5 (L) 05/10/2020   HCT 23.3 (L) 05/10/2020   MCV 94.3 05/10/2020   PLT 165 05/10/2020    Lab Results  Component Value Date   CREATININE 1.12 (H) 05/10/2020   BUN 12 05/10/2020   NA 136 05/10/2020   K 3.4 (L) 05/10/2020   CL 108 05/10/2020   CO2 22 05/10/2020    Lab Results  Component Value Date   ALT 24 05/09/2020   AST 43 (H) 05/09/2020   GGT 29 04/04/2020   ALKPHOS 168 (H) 05/09/2020   BILITOT 0.8 05/09/2020     Microbiology: Recent Results (from the past 240 hour(s))  Resp Panel by RT-PCR (Flu A&B, Covid) Nasopharyngeal Swab     Status: None   Collection Time: 05/09/20  7:30 PM   Specimen: Nasopharyngeal Swab; Nasopharyngeal(NP) swabs in vial transport medium  Result Value Ref Range Status   SARS Coronavirus 2 by RT PCR NEGATIVE NEGATIVE Final    Comment: (NOTE) SARS-CoV-2 target nucleic acids are NOT DETECTED.  The SARS-CoV-2 RNA is generally detectable in upper respiratory specimens during the acute  phase of infection. The lowest concentration of SARS-CoV-2 viral  copies this assay can detect is 138 copies/mL. A negative result does not preclude SARS-Cov-2 infection and should not be used as the sole basis for treatment or other patient management decisions. A negative result may occur with  improper specimen collection/handling, submission of specimen other than nasopharyngeal swab, presence of viral mutation(s) within the areas targeted by this assay, and inadequate number of viral copies(<138 copies/mL). A negative result must be combined with clinical observations, patient history, and epidemiological information. The expected result is Negative.  Fact Sheet for Patients:  EntrepreneurPulse.com.au  Fact Sheet for Healthcare Providers:  IncredibleEmployment.be  This test is no t yet approved or cleared by the [place] FDA and  has been authorized for detection and/or diagnosis of SARS-CoV-2 by FDA under an Emergency Use Authorization (EUA). This EUA will remain  in effect (meaning this test can be used) for the duration of the COVID-19 declaration under Section 564(b)(1) of the Act, 21 U.S.C.section 360bbb-3(b)(1), unless the authorization is terminated  or revoked sooner.       Influenza A by PCR NEGATIVE NEGATIVE Final   Influenza B by PCR NEGATIVE NEGATIVE Final    Comment: (NOTE) The Xpert Xpress SARS-CoV-2/FLU/RSV plus assay is intended as an aid in the diagnosis of influenza from Nasopharyngeal swab specimens and should not be used as a sole basis for treatment. Nasal washings and aspirates are unacceptable for Xpert Xpress SARS-CoV-2/FLU/RSV testing.  Fact Sheet for Patients: EntrepreneurPulse.com.au  Fact Sheet for Healthcare Providers: IncredibleEmployment.be  This test is not yet approved or cleared by the [place] FDA and has been authorized for detection and/or diagnosis of SARS-CoV-2 by FDA under an Emergency Use Authorization (EUA). This  EUA will remain in effect (meaning this test can be used) for the duration of the COVID-19 declaration under Section 564(b)(1) of the Act, 21 U.S.C. section 360bbb-3(b)(1), unless the authorization is terminated or revoked.  Performed at [facility], [place] [address], [place], [place] [address]     Alcide Evener, [place] [facility] [phone] pager  05/10/2020, 3:48 PM

## 2020-05-10 NOTE — Progress Notes (Signed)
PROGRESS NOTE    [name]  ATF:[id number] DOB: 08/03/1945 DOA: 05/09/2020 PCP: Hendricks Limes, MD    Chief Complaint  Patient presents with  . Altered Mental Status  . Hypoglycemia    Brief Narrative:  [name] is a 75 y.o. female with medical history significant of breast CA in 1990's  s/p mastectomy, chemotherapy,  CVA, AS s/p AVR in 2006, then TAVR in 2021, HFpEF + CHB + PPM (placed 01/2019)-bacteremia group B strep 06/2019 status post PPM explantation and then subsequent Medtronic Micra, h/o    right lower extremity ischemia status post angiography in 12/2019, h/o insulin dependent diabetes , Pt admitted to hospital 2/8 - 2/16 with critical ischemia of R leg and open ulcer of heel growing Morganella morganii on culture.  Other issues included Sacral decubitus ulcer, and urinary retention.Patient was discharged on Invanz through March 25th   FTT has been at nursing home for about a year, family noticed progressive decline for about 59-month, with poor oral intake, weight loss of 30 to 40 pounds, has been bedbound, needing to be fed,  She was brought to the ED from nursing home due to altered mental status, found to have hypoglycemia, blood glucose 29, hypothermia and hypotension  Subjective:  Patient is lethargic, does not know the time, she does know that she is in the hospital She has Retail banker on , she denies pain Daughter at bedside  Assessment & Plan:   Principal Problem:   Acute encephalopathy Active Problems:   DM2 (diabetes mellitus, type 2) ([place])   Hypertension   Hypoglycemia   Benign hypertensive heart and kidney disease with diastolic CHF, NYHA class 3 and CKD stage 3 ([place])   Sacral decubitus ulcer, stage IV ([facility])   Sepsis ([place])   Catheter-associated urinary tract infection ([place])   Hypothermia   Decubitus ulcer of right heel, stage 3 ([place])  Acute metabolic encephalopathy,  there is documentation of encephalopathy during last hospitalization  as well She does has history of CVA, I wonder if she has underlying dementia Apparently patient has acute mental status changes, per daughter patient's alert and oriented x3 at baseline Currently she is lethargic, not oriented to time May need temporary core track feeding tube if patient mental status does not improve  Hypoglycemia with history of insulin-dependent type 2 diabetes Blood glucose improved on D10 infusion, will decrease rate of infusion Continue hold insulin  History of hypertension, present with hypotension Hold Lopressor Blood pressure improved  Hypothermia Body temperature improved with Bair hugger  Possible sepsis, present with hypothermia , hypotension , leukocytosis , lactic acidosis with multiple possible source of infection including UTI, sacral decubitus ulcer and heel ulcer, she also has indwelling tunneled line She has been on Invanz prior to admission, make culture result difficult, collect urine culture, follow-up blood culture result Indwelling Foley changed out in the ED She is started on broad-spectrum antibiotics ID consulted   CKD 3 a/anemia of chronic disease Monitor renal function, renal dosing medication Pt is a Jehovah's witness.  PVD right lower extremity ischemia status post angiography in 12/2019 Aspirin/Plavix/statin held due to not able to take oral meds from altered mental status Daughter agreed to temporary core track feeding tube if patient does not improve by tomorrow   H/o CVA, AS s/p AVR in 2006, then TAVR in 2021, HFpEF + CHB + PPM (placed 01/2019)-bacteremia group B strep 06/2019 status post PPM explantation and then subsequent Medtronic Micra  H/o  breast CA in  1990's  s/p left mastectomy, chemotherapy  FTT/malnutrition/weight loss of 40 pounds in the last 40-month, has been bedbound for about 4 months per daughter Albumin 1.2 Nutrition consult, daughter agreed to palliative care consult    Body mass index is 22.1  kg/m.Marland Kitchen   Skin Assessment: I have examined the patient's skin and I agree with the wound assessment as performed by the wound care RN as outlined below: Pressure Injury 05/10/20 Sacrum Stage 4 - Full thickness tissue loss with exposed bone, tendon or muscle. (Active)  05/10/20   Location: Sacrum  Location Orientation:   Staging: Stage 4 - Full thickness tissue loss with exposed bone, tendon or muscle.  Wound Description (Comments):   Present on Admission: Yes     Pressure Injury 05/10/20 Heel Right Stage 4 - Full thickness tissue loss with exposed bone, tendon or muscle. (Active)  05/10/20   Location: Heel  Location Orientation: Right  Staging: Stage 4 - Full thickness tissue loss with exposed bone, tendon or muscle.  Wound Description (Comments):   Present on Admission: Yes    Unresulted Labs (From admission, onward)          Start     Ordered   05/11/20 0500  CBC with Differential/Platelet  Tomorrow morning,   R        05/10/20 1733   05/11/20 0500  Comprehensive metabolic panel  Tomorrow morning,   R        05/10/20 1733   05/11/20 0500  Magnesium  Tomorrow morning,   R        05/10/20 1733   05/11/20 0500  Lactic acid, plasma  Tomorrow morning,   R        05/10/20 1733   05/11/20 0500  Lipid panel  Tomorrow morning,   R        05/10/20 1735   05/10/20 0656  Culture, Urine  Once,   STAT        05/10/20 0656   05/09/20 1906  Blood Culture (routine x 2)  (Septic presentation on arrival (screening labs, nursing and treatment orders for obvious sepsis))  BLOOD CULTURE X 2,   STAT      05/09/20 1908   05/09/20 1906  Urine culture  (Septic presentation on arrival (screening labs, nursing and treatment orders for obvious sepsis))  ONCE - STAT,   STAT        05/09/20 1908            DVT prophylaxis: enoxaparin (LOVENOX) injection 40 mg Start: 05/09/20 2315   Code Status:full Family Communication: daughter at bedside, another daughter over the phone Disposition:    Status is: Inpatient   Dispo: The patient is from: SNF              Anticipated d/c is to: SNF              Anticipated d/c date is: TBD                Consultants:   ID  Palliative care  Wound care  Procedures:   None  Antimicrobials:   Anti-infectives (From admission, onward)   Start     Dose/Rate Route Frequency Ordered Stop   05/10/20 2100  vancomycin (VANCOREADY) IVPB 1000 mg/200 mL        1,000 mg 200 mL/hr over 60 Minutes Intravenous Every 24 hours 05/09/20 2040     05/10/20 0800  ceFEPIme (MAXIPIME) 2 g in sodium chloride 0.9 % 100 mL  IVPB  Status:  Discontinued        2 g 200 mL/hr over 30 Minutes Intravenous Every 12 hours 05/09/20 2040 05/09/20 2146   05/09/20 2300  meropenem (MERREM) 2 g in sodium chloride 0.9 % 100 mL IVPB        2 g 200 mL/hr over 30 Minutes Intravenous Every 12 hours 05/09/20 2146     05/09/20 2000  vancomycin (VANCOREADY) IVPB 1000 mg/200 mL        1,000 mg 200 mL/hr over 60 Minutes Intravenous  Once 05/09/20 1908 05/09/20 2157   05/09/20 1915  ceFEPIme (MAXIPIME) 2 g in sodium chloride 0.9 % 100 mL IVPB        2 g 200 mL/hr over 30 Minutes Intravenous  Once 05/09/20 1908 05/09/20 1959   05/09/20 1915  metroNIDAZOLE (FLAGYL) IVPB 500 mg        500 mg 100 mL/hr over 60 Minutes Intravenous  Once 05/09/20 1908 05/09/20 2056          Objective: Vitals:   05/10/20 1100 05/10/20 1244 05/10/20 1405 05/10/20 1636  BP: (!) 137/59 122/71 (!) 156/83 (!) 151/64  Pulse: 68 62 71 61  Resp: $Remo'14 13 18 18  'gZOfX$ Temp:  98 F (36.7 C) (!) 97.5 F (36.4 C) 98.2 F (36.8 C)  TempSrc:  Oral Oral Oral  SpO2: 100% 100% 100% 99%  Weight:      Height:        Intake/Output Summary (Last 24 hours) at 05/10/2020 1737 Last data filed at 05/10/2020 1639 Gross per 24 hour  Intake 3117.1 ml  Output 1050 ml  Net 2067.1 ml   Filed Weights   05/09/20 1916  Weight: 62.1 kg    Examination:  General exam: Very lethargic, opens eyes briefly to  question, knows she is in the hospital, not oriented to time Respiratory system: Clear to auscultation. Respiratory effort normal. Cardiovascular system: Paced rhythm.  Gastrointestinal system: Abdomen is nondistended, soft and nontender.  Normal bowel sounds heard. Central nervous system: Very lethargic, opens eyes briefly to question, knows she is in the hospital, not oriented to time Extremities: Does not follow command, does move upper extremities, no movement in lower extremities noticed Skin: Sacral decubitus ulcers, right heel ulcers Psychiatry: Lethargic, no agitation.     Data Reviewed: I have personally reviewed following labs and imaging studies  CBC: Recent Labs  Lab 05/09/20 1906 05/10/20 0924  WBC 11.2* 14.7*  NEUTROABS 9.2*  --   HGB 9.6* 7.5*  HCT 29.8* 23.3*  MCV 94.9 94.3  PLT 215 195    Basic Metabolic Panel: Recent Labs  Lab 05/09/20 1906 05/10/20 0924  NA 137 136  K 3.7 3.4*  CL 109 108  CO2 21* 22  GLUCOSE 34* 173*  BUN 14 12  CREATININE 1.28* 1.12*  CALCIUM 7.7* 7.1*    GFR: Estimated Creatinine Clearance: 40 mL/min (A) (by C-G formula based on SCr of 1.12 mg/dL (H)).  Liver Function Tests: Recent Labs  Lab 05/09/20 1906  AST 43*  ALT 24  ALKPHOS 168*  BILITOT 0.8  PROT 5.6*  ALBUMIN 1.2*    CBG: Recent Labs  Lab 05/09/20 2015 05/09/20 2147 05/10/20 0133 05/10/20 0257 05/10/20 1634  GLUCAP 95 102* 152* 152* 118*     Recent Results (from the past 240 hour(s))  Resp Panel by RT-PCR (Flu A&B, Covid) Nasopharyngeal Swab     Status: None   Collection Time: 05/09/20  7:30 PM   Specimen: Nasopharyngeal  Swab; Nasopharyngeal(NP) swabs in vial transport medium  Result Value Ref Range Status   SARS Coronavirus 2 by RT PCR NEGATIVE NEGATIVE Final    Comment: (NOTE) SARS-CoV-2 target nucleic acids are NOT DETECTED.  The SARS-CoV-2 RNA is generally detectable in upper respiratory specimens during the acute phase of infection. The  lowest concentration of SARS-CoV-2 viral copies this assay can detect is 138 copies/mL. A negative result does not preclude SARS-Cov-2 infection and should not be used as the sole basis for treatment or other patient management decisions. A negative result may occur with  improper specimen collection/handling, submission of specimen other than nasopharyngeal swab, presence of viral mutation(s) within the areas targeted by this assay, and inadequate number of viral copies(<138 copies/mL). A negative result must be combined with clinical observations, patient history, and epidemiological information. The expected result is Negative.  Fact Sheet for Patients:  BloggerCourse.com  Fact Sheet for Healthcare Providers:  SeriousBroker.it  This test is no t yet approved or cleared by the [place] FDA and  has been authorized for detection and/or diagnosis of SARS-CoV-2 by FDA under an Emergency Use Authorization (EUA). This EUA will remain  in effect (meaning this test can be used) for the duration of the COVID-19 declaration under Section 564(b)(1) of the Act, 21 U.S.C.section 360bbb-3(b)(1), unless the authorization is terminated  or revoked sooner.       Influenza A by PCR NEGATIVE NEGATIVE Final   Influenza B by PCR NEGATIVE NEGATIVE Final    Comment: (NOTE) The Xpert Xpress SARS-CoV-2/FLU/RSV plus assay is intended as an aid in the diagnosis of influenza from Nasopharyngeal swab specimens and should not be used as a sole basis for treatment. Nasal washings and aspirates are unacceptable for Xpert Xpress SARS-CoV-2/FLU/RSV testing.  Fact Sheet for Patients: BloggerCourse.com  Fact Sheet for Healthcare Providers: SeriousBroker.it  This test is not yet approved or cleared by the [place] FDA and has been authorized for detection and/or diagnosis of SARS-CoV-2 by FDA under  an Emergency Use Authorization (EUA). This EUA will remain in effect (meaning this test can be used) for the duration of the COVID-19 declaration under Section 564(b)(1) of the Act, 21 U.S.C. section 360bbb-3(b)(1), unless the authorization is terminated or revoked.  Performed at [facility], [address] [place], [place] [address]          Radiology Studies: DG Chest Port 1 View  Result Date: 05/09/2020 CLINICAL DATA:  Lethargy and altered mental status. Questionable sepsis - evaluate for abnormality EXAM: PORTABLE CHEST 1 VIEW COMPARISON:  Radiograph 02/08/2020 FINDINGS: Right internal jugular central venous catheter tip at the atrial caval junction. Post median sternotomy and TAVR. Implanted loop recorder projects over the left chest wall. Lung volumes are low. Mild streaky atelectasis at the left greater than right lung base. No confluent consolidation. Stable heart size. Unchanged mediastinal contours with aortic atherosclerosis and tortuosity. Chronic cardiomegaly. No pulmonary edema. No large pleural effusion or pneumothorax. IMPRESSION: Low lung volumes with streaky atelectasis at the left greater than right lung base. Electronically Signed   By: Narda Rutherford M.D.   On: 05/09/2020 19:59        Scheduled Meds: . [START ON 05/11/2020] Chlorhexidine Gluconate Cloth  6 each Topical Daily  . collagenase   Topical Daily  . enoxaparin (LOVENOX) injection  40 mg Subcutaneous QHS   Continuous Infusions: . dextrose 125 mL/hr at 05/10/20 1053  . meropenem (MERREM) IV 2 g (05/10/20 1054)  . vancomycin  LOS: 1 day   Time spent: 59mins Greater than 50% of this time was spent in counseling, explanation of diagnosis, planning of further management, and coordination of care.   Voice Recognition Viviann Spare dictation system was used to create this note, attempts have been made to correct errors. Please contact the author with questions and/or clarifications.   Florencia Reasons, MD PhD FACP [facility]  Available via Epic secure chat 7am-7pm for nonurgent issues Please page for urgent issues To page the attending provider between 7A-7P or the covering provider during after hours 7P-7A, please log into the web site [email or web address] and access using universal [place] password for that web site. If you do not have the password, please call the hospital operator.    05/10/2020, 5:37 PM

## 2020-05-10 NOTE — Progress Notes (Addendum)
Mobility Specialist: Progress Note   05/10/20 1604  Mobility  Activity Contraindicated/medical hold   Instructed by RN to not work with pt today.   [facility] Parcher Mobility Specialist Mobility Specialist Phone: [phone]

## 2020-05-10 NOTE — ED Notes (Signed)
The pts daughter Vella Raring number [phone] call for updates

## 2020-05-10 NOTE — ED Notes (Signed)
The pt is c/o being cold she still has the heating blamket on from earlier on  The nurse I received report from at 0300 reported that she was leaving it on.

## 2020-05-10 NOTE — Sepsis Progress Note (Signed)
Notified provider of need to order repeat lactic acid. ° °

## 2020-05-10 NOTE — Progress Notes (Signed)
Overall improvement in inflammatory markers with ESR normalizing and CRP down 4.3 >> 2.3 mg/dL. Plan to continue current IV therapy and d/c as expected.

## 2020-05-10 NOTE — Consult Note (Signed)
[facility] Nurse Consult Note: Reason for Consult: Consult requested for right heel and sacrum.  Pt was recently in the hospital in Feb and the surgical team debrided the sacrum wound at that time.  Wound type: Sacrum with chronic Stage 4 pressure injury;  70% red, 30% yellow, tightly adhered to exposed bone. Mod amt tan drainage and strong odor.  9X6X5cm with 4 cm undermining to wound edges.  Patchy areas of red moist shallow partial thickness wounds scattered across the bilat buttocks; appearance is consistent with moisture associated skin damage.  Pt is frequently incontinent of urine and stool and it is difficult to keep the wound from becoming soiled.  Right heel with chronic stage 4 pressure injury; 80% red, 20% yellow, mod amt tan drainage, no odor.  7X7X.4cm Daughter at the bedside to assess wound appearance and discuss plan of care; pt is currently in the ED.  Pressure Injury POA: Yes Dressing procedure/placement/frequency: Float heels to reduce pressure. Topical treatment orders provided for bedside nurses to perform to assist with enzymatic debridement of nonviable tissue as follows:Apply Santyl to sacrum and right heel wounds Q day, then cover with moist gauze and foam dressing.  (Change foam dressing Q 3 days or PRN soiling.) Please re-consult if further assistance is needed.  Thank-you,  Julien Girt MSN, [place], [place], [place], [place]

## 2020-05-11 ENCOUNTER — Inpatient Hospital Stay: Payer: Medicare Other | Attending: Oncology | Admitting: Oncology

## 2020-05-11 ENCOUNTER — Inpatient Hospital Stay (HOSPITAL_COMMUNITY): Payer: Medicare Other

## 2020-05-11 ENCOUNTER — Inpatient Hospital Stay: Payer: Medicare Other

## 2020-05-11 DIAGNOSIS — I130 Hypertensive heart and chronic kidney disease with heart failure and stage 1 through stage 4 chronic kidney disease, or unspecified chronic kidney disease: Secondary | ICD-10-CM

## 2020-05-11 DIAGNOSIS — R601 Generalized edema: Secondary | ICD-10-CM | POA: Diagnosis not present

## 2020-05-11 DIAGNOSIS — Z515 Encounter for palliative care: Secondary | ICD-10-CM

## 2020-05-11 DIAGNOSIS — G928 Other toxic encephalopathy: Secondary | ICD-10-CM

## 2020-05-11 DIAGNOSIS — M7989 Other specified soft tissue disorders: Secondary | ICD-10-CM | POA: Diagnosis not present

## 2020-05-11 DIAGNOSIS — A419 Sepsis, unspecified organism: Principal | ICD-10-CM

## 2020-05-11 DIAGNOSIS — L89613 Pressure ulcer of right heel, stage 3: Secondary | ICD-10-CM

## 2020-05-11 DIAGNOSIS — E43 Unspecified severe protein-calorie malnutrition: Secondary | ICD-10-CM | POA: Insufficient documentation

## 2020-05-11 DIAGNOSIS — T83511A Infection and inflammatory reaction due to indwelling urethral catheter, initial encounter: Secondary | ICD-10-CM | POA: Diagnosis not present

## 2020-05-11 DIAGNOSIS — G9340 Encephalopathy, unspecified: Secondary | ICD-10-CM | POA: Diagnosis not present

## 2020-05-11 DIAGNOSIS — I5030 Unspecified diastolic (congestive) heart failure: Secondary | ICD-10-CM

## 2020-05-11 DIAGNOSIS — N183 Chronic kidney disease, stage 3 unspecified: Secondary | ICD-10-CM

## 2020-05-11 DIAGNOSIS — R6520 Severe sepsis without septic shock: Secondary | ICD-10-CM

## 2020-05-11 LAB — COMPREHENSIVE METABOLIC PANEL
ALT: 22 U/L (ref 0–44)
AST: 31 U/L (ref 15–41)
Albumin: <1 g/dL — ABNORMAL LOW (ref 3.5–5.0)
Alkaline Phosphatase: 127 U/L — ABNORMAL HIGH (ref 38–126)
Anion gap: 7 (ref 5–15)
BUN: 13 mg/dL (ref 8–23)
CO2: 22 mmol/L (ref 22–32)
Calcium: 7.2 mg/dL — ABNORMAL LOW (ref 8.9–10.3)
Chloride: 108 mmol/L (ref 98–111)
Creatinine, Ser: 1.21 mg/dL — ABNORMAL HIGH (ref 0.44–1.00)
GFR, Estimated: 46 mL/min — ABNORMAL LOW (ref >60)
Glucose, Bld: 62 mg/dL — ABNORMAL LOW (ref 70–99)
Potassium: 4 mmol/L (ref 3.5–5.1)
Sodium: 137 mmol/L (ref 135–145)
Total Bilirubin: 1 mg/dL (ref 0.3–1.2)
Total Protein: 4.1 g/dL — ABNORMAL LOW (ref 6.5–8.1)

## 2020-05-11 LAB — CBC WITH DIFFERENTIAL/PLATELET
Abs Immature Granulocytes: 0.08 10*3/uL — ABNORMAL HIGH (ref 0.00–0.07)
Basophils Absolute: 0 10*3/uL (ref 0.0–0.1)
Basophils Relative: 0 %
Eosinophils Absolute: 0.4 10*3/uL (ref 0.0–0.5)
Eosinophils Relative: 4 %
HCT: 20.8 % — ABNORMAL LOW (ref 36.0–46.0)
Hemoglobin: 7 g/dL — ABNORMAL LOW (ref 12.0–15.0)
Immature Granulocytes: 1 %
Lymphocytes Relative: 17 %
Lymphs Abs: 1.9 10*3/uL (ref 0.7–4.0)
MCH: 31.3 pg (ref 26.0–34.0)
MCHC: 33.7 g/dL (ref 30.0–36.0)
MCV: 92.9 fL (ref 80.0–100.0)
Monocytes Absolute: 1.2 10*3/uL — ABNORMAL HIGH (ref 0.1–1.0)
Monocytes Relative: 10 %
Neutro Abs: 8.1 10*3/uL — ABNORMAL HIGH (ref 1.7–7.7)
Neutrophils Relative %: 68 %
Platelets: 160 10*3/uL (ref 150–400)
RBC: 2.24 MIL/uL — ABNORMAL LOW (ref 3.87–5.11)
RDW: 17.2 % — ABNORMAL HIGH (ref 11.5–15.5)
WBC: 11.8 10*3/uL — ABNORMAL HIGH (ref 4.0–10.5)
nRBC: 0 % (ref 0.0–0.2)

## 2020-05-11 LAB — GLUCOSE, CAPILLARY
Glucose-Capillary: 128 mg/dL — ABNORMAL HIGH (ref 70–99)
Glucose-Capillary: 134 mg/dL — ABNORMAL HIGH (ref 70–99)
Glucose-Capillary: 139 mg/dL — ABNORMAL HIGH (ref 70–99)
Glucose-Capillary: 159 mg/dL — ABNORMAL HIGH (ref 70–99)
Glucose-Capillary: 46 mg/dL — ABNORMAL LOW (ref 70–99)
Glucose-Capillary: 68 mg/dL — ABNORMAL LOW (ref 70–99)
Glucose-Capillary: 69 mg/dL — ABNORMAL LOW (ref 70–99)
Glucose-Capillary: 69 mg/dL — ABNORMAL LOW (ref 70–99)
Glucose-Capillary: 73 mg/dL (ref 70–99)
Glucose-Capillary: 77 mg/dL (ref 70–99)
Glucose-Capillary: 80 mg/dL (ref 70–99)

## 2020-05-11 LAB — MAGNESIUM: Magnesium: 1.6 mg/dL — ABNORMAL LOW (ref 1.7–2.4)

## 2020-05-11 LAB — LACTIC ACID, PLASMA: Lactic Acid, Venous: 1.4 mmol/L (ref 0.5–1.9)

## 2020-05-11 LAB — LIPID PANEL
Cholesterol: 38 mg/dL (ref 0–200)
HDL: 15 mg/dL — ABNORMAL LOW (ref >40)
LDL Cholesterol: 9 mg/dL (ref 0–99)
Total CHOL/HDL Ratio: 2.5 RATIO
Triglycerides: 69 mg/dL (ref <150)
VLDL: 14 mg/dL (ref 0–40)

## 2020-05-11 MED ORDER — DEXTROSE 50 % IV SOLN
INTRAVENOUS | Status: AC
  Filled 2020-05-11: qty 50

## 2020-05-11 MED ORDER — OSMOLITE 1.5 CAL PO LIQD
1000.0000 mL | ORAL | Status: DC
  Administered 2020-05-11 – 2020-05-16 (×3): 1000 mL
  Filled 2020-05-11 (×9): qty 1000

## 2020-05-11 MED ORDER — SODIUM CHLORIDE 0.9 % IV SOLN
1.0000 g | INTRAVENOUS | Status: DC
  Administered 2020-05-11 – 2020-05-16 (×6): 1000 mg via INTRAVENOUS
  Filled 2020-05-11 (×7): qty 1

## 2020-05-11 MED ORDER — DEXTROSE 50 % IV SOLN
25.0000 g | INTRAVENOUS | Status: AC
  Administered 2020-05-11: 25 g via INTRAVENOUS

## 2020-05-11 MED ORDER — MAGNESIUM SULFATE 2 GM/50ML IV SOLN
2.0000 g | Freq: Once | INTRAVENOUS | Status: AC
  Administered 2020-05-11: 2 g via INTRAVENOUS
  Filled 2020-05-11: qty 50

## 2020-05-11 MED ORDER — OXYCODONE-ACETAMINOPHEN 5-325 MG PO TABS
1.0000 | ORAL_TABLET | Freq: Four times a day (QID) | ORAL | Status: DC | PRN
  Administered 2020-05-11 – 2020-05-14 (×2): 1 via ORAL
  Filled 2020-05-11 (×2): qty 1

## 2020-05-11 MED ORDER — GADOBUTROL 1 MMOL/ML IV SOLN
6.0000 mL | Freq: Once | INTRAVENOUS | Status: AC | PRN
  Administered 2020-05-15: 6 mL via INTRAVENOUS

## 2020-05-11 MED ORDER — PROSOURCE TF PO LIQD
45.0000 mL | Freq: Three times a day (TID) | ORAL | Status: DC
  Administered 2020-05-11 – 2020-05-17 (×18): 45 mL
  Filled 2020-05-11 (×18): qty 45

## 2020-05-11 NOTE — Progress Notes (Signed)
Initial Nutrition Assessment  DOCUMENTATION CODES:   Severe malnutrition in context of chronic illness  INTERVENTION:   Monitor magnesium, potassium, and phosphorus daily for at least 3 days, MD to replete as needed, as pt is at risk for refeeding syndrome.   Tube feeding:  -Osmolite 1.5 @ 20 ml/hr via Cortrak -Increase by 10 ml Q6 hours to goal rate of 50 ml/hr (1200 ml) -ProSource TF 45 ml TID  Provides: 1960 kcals, 114 grams protein, 914 ml free water.   NUTRITION DIAGNOSIS:   Severe Malnutrition related to chronic illness (poor mental status/PVD with chronic wounds) as evidenced by energy intake < or equal to 75% for > or equal to 1 month,moderate fat depletion,severe muscle depletion.  GOAL:   Patient will meet greater than or equal to 90% of their needs  MONITOR:   PO intake,Supplement acceptance,Weight trends,Labs,I & O's,Skin  REASON FOR ASSESSMENT:   Consult Enteral/tube feeding initiation and management  ASSESSMENT:   Patient with PMH significant for CVA, AS s/p AVR in 2006, s/p TAVR in 2021, PPM 2020, CAD, CHF,PVD, and CKD III. Presents this admission with acute encephalopathy 2/2 to hypoglycemia.   Patient non verbal upon RD assessment. No family at bedside. Per H&P, patient's had poor PO intake over the last three months due to intermittent confusion. Family has brought food for her this admission but she has not ate. Per RN, mental status status has declined further this am. Cortrak to be placed at bedside. Titrate TF to goal as patient is at risk for refeeding. If mental status/PO intake unable to improve, recommend [place] discussion regarding long term artifical nutrition.    Unsure of UBW. Records indicate patient weighed 77.8 kg one year ago and 62.1 kg this admission. Suspect significant dry weight loss during this time period but unable to distinguish dry wt loss from fluid loss with history of CHF. Patient shows to have moderate to severe edema in  extremities, expect dry weight to be lower.    Drips: D10 @ 50 ml/hr, Mg sulfate  Labs: Mg 1.6 (L) Cr 1.21-trending up CBG 46-134  NUTRITION - FOCUSED PHYSICAL EXAM:  Flowsheet Row Most Recent Value  Orbital Region Moderate depletion  Upper Arm Region Unable to assess  [severe edema in bilateral arms]  Thoracic and Lumbar Region Unable to assess  Buccal Region Moderate depletion  Temple Region Moderate depletion  Clavicle Bone Region Severe depletion  Clavicle and Acromion Bone Region Severe depletion  Scapular Bone Region Unable to assess  Dorsal Hand Unable to assess  Patellar Region Unable to assess  Anterior Thigh Region Unable to assess  Posterior Calf Region Unable to assess  Edema (RD Assessment) Severe  [extremities]  Hair Reviewed  Eyes Unable to assess  Mouth Unable to assess  Skin Reviewed     Diet Order:   Diet Order            Diet regular Room service appropriate? Yes; Fluid consistency: Thin  Diet effective now                 EDUCATION NEEDS:   Not appropriate for education at this time  Skin:  Skin Assessment: Skin Integrity Issues: Skin Integrity Issues:: Stage IV Stage IV: sacrum, R heel  Last BM:  3/14  Height:   Ht Readings from Last 1 Encounters:  05/09/20 $RemoveB'5\' 6"'[id number]$  (1.676 m)    Weight:   Wt Readings from Last 1 Encounters:  05/09/20 62.1 kg    BMI:  Body  mass index is 22.1 kg/m.  Estimated Nutritional Needs:   Kcal:  1750-1950 kcal  Protein:  100-115 grams  Fluid:  >/= 1.7 L/day  Mariana Single RD, LDN Clinical Nutrition Pager listed in [place]

## 2020-05-11 NOTE — Progress Notes (Signed)
Hypoglycemic Event  CBG: 46   Treatment: dextrose 50% solution 25g  Symptoms: confusion and lethargy  Follow-up CBG: [id number] CBG Result:128  Possible Reasons for Event: poor oral intake  Comments/MD notified    [name]

## 2020-05-11 NOTE — Progress Notes (Addendum)
Subjective: Patient is somnolent   Antibiotics:  Anti-infectives (From admission, onward)   Start     Dose/Rate Route Frequency Ordered Stop   05/11/20 2200  ertapenem (INVANZ) 1,000 mg in sodium chloride 0.9 % 100 mL IVPB        1 g 200 mL/hr over 30 Minutes Intravenous Every 24 hours 05/11/20 0959     05/10/20 2100  vancomycin (VANCOREADY) IVPB 1000 mg/200 mL  Status:  Discontinued        1,000 mg 200 mL/hr over 60 Minutes Intravenous Every 24 hours 05/09/20 2040 05/11/20 1124   05/10/20 0800  ceFEPIme (MAXIPIME) 2 g in sodium chloride 0.9 % 100 mL IVPB  Status:  Discontinued        2 g 200 mL/hr over 30 Minutes Intravenous Every 12 hours 05/09/20 2040 05/09/20 2146   05/09/20 2300  meropenem (MERREM) 2 g in sodium chloride 0.9 % 100 mL IVPB        2 g 200 mL/hr over 30 Minutes Intravenous Every 12 hours 05/09/20 2146 05/11/20 1034   05/09/20 2000  vancomycin (VANCOREADY) IVPB 1000 mg/200 mL        1,000 mg 200 mL/hr over 60 Minutes Intravenous  Once 05/09/20 1908 05/09/20 2157   05/09/20 1915  ceFEPIme (MAXIPIME) 2 g in sodium chloride 0.9 % 100 mL IVPB        2 g 200 mL/hr over 30 Minutes Intravenous  Once 05/09/20 1908 05/09/20 1959   05/09/20 1915  metroNIDAZOLE (FLAGYL) IVPB 500 mg        500 mg 100 mL/hr over 60 Minutes Intravenous  Once 05/09/20 1908 05/09/20 2056      Medications: Scheduled Meds: . Chlorhexidine Gluconate Cloth  6 each Topical Daily  . collagenase   Topical Daily  . enoxaparin (LOVENOX) injection  40 mg Subcutaneous QHS   Continuous Infusions: . dextrose 50 mL/hr at 05/11/20 0921  . ertapenem    . magnesium sulfate bolus IVPB 2 g (05/11/20 1135)   PRN Meds:.acetaminophen **OR** acetaminophen, ondansetron **OR** ondansetron (ZOFRAN) IV    Objective: Weight change:   Intake/Output Summary (Last 24 hours) at 05/11/2020 1215 Last data filed at 05/11/2020 0856 Gross per 24 hour  Intake 953.67 ml  Output 1525 ml  Net -571.33 ml    Blood pressure (!) 105/47, pulse 64, temperature 98.8 F (37.1 C), temperature source Oral, resp. rate 18, height $RemoveBe'5\' 6"'IOSdzSaZD$  (1.676 m), weight 62.1 kg, SpO2 100 %. Temp:  [97.5 F (36.4 C)-99.2 F (37.3 C)] 98.8 F (37.1 C) (03/18 0856) Pulse Rate:  [61-71] 64 (03/18 0856) Resp:  [13-18] 18 (03/18 0856) BP: (100-156)/(47-83) 105/47 (03/18 0856) SpO2:  [99 %-100 %] 100 % (03/18 0856)  Physical Exam: Physical Exam Eyes:     Extraocular Movements: Extraocular movements intact.  Cardiovascular:     Rate and Rhythm: Normal rate.     Heart sounds: Murmur heard.  No gallop.   Pulmonary:     Effort: Pulmonary effort is normal. No respiratory distress.     Breath sounds: Normal breath sounds.  Abdominal:     General: Bowel sounds are normal. There is no distension.     Palpations: There is no mass.  Neurological:     Mental Status: She is disoriented.  Psychiatric:        Speech: She is noncommunicative.        Cognition and Memory: Cognition is impaired.     Right foot 3/17:      ""  3/18:    Decubitus ulcer not examined today.  CBC:    BMET Recent Labs    05/10/20 0924 05/11/20 0500  NA 136 137  K 3.4* 4.0  CL 108 108  CO2 22 22  GLUCOSE 173* 62*  BUN 12 13  CREATININE 1.12* 1.21*  CALCIUM 7.1* 7.2*     Liver Panel  Recent Labs    05/09/20 1906 05/11/20 0500  PROT 5.6* 4.1*  ALBUMIN 1.2* <1.0*  AST 43* 31  ALT 24 22  ALKPHOS 168* 127*  BILITOT 0.8 1.0       Sedimentation Rate Recent Labs    05/09/20 1440  ESRSEDRATE 22   C-Reactive Protein Recent Labs    05/09/20 1440  CRP 23.0*    Micro Results: Recent Results (from the past 720 hour(s))  Blood Culture (routine x 2)     Status: None (Preliminary result)   Collection Time: 05/09/20  7:25 PM   Specimen: BLOOD  Result Value Ref Range Status   Specimen Description BLOOD SITE NOT SPECIFIED  Final   Special Requests   Final    BOTTLES DRAWN AEROBIC AND ANAEROBIC Blood Culture  adequate volume   Culture   Final    NO GROWTH 1 DAY Performed at [facility], [facility] [address] [place], [place] [address]    Report Status PENDING  Incomplete  Resp Panel by RT-PCR (Flu A&B, Covid) Nasopharyngeal Swab     Status: None   Collection Time: 05/09/20  7:30 PM   Specimen: Nasopharyngeal Swab; Nasopharyngeal(NP) swabs in vial transport medium  Result Value Ref Range Status   SARS Coronavirus 2 by RT PCR NEGATIVE NEGATIVE Final    Comment: (NOTE) SARS-CoV-2 target nucleic acids are NOT DETECTED.  The SARS-CoV-2 RNA is generally detectable in upper respiratory specimens during the acute phase of infection. The lowest concentration of SARS-CoV-2 viral copies this assay can detect is 138 copies/mL. A negative result does not preclude SARS-Cov-2 infection and should not be used as the sole basis for treatment or other patient management decisions. A negative result may occur with  improper specimen collection/handling, submission of specimen other than nasopharyngeal swab, presence of viral mutation(s) within the areas targeted by this assay, and inadequate number of viral copies(<138 copies/mL). A negative result must be combined with clinical observations, patient history, and epidemiological information. The expected result is Negative.  Fact Sheet for Patients:  EntrepreneurPulse.com.au  Fact Sheet for Healthcare Providers:  IncredibleEmployment.be  This test is no t yet approved or cleared by the [place] FDA and  has been authorized for detection and/or diagnosis of SARS-CoV-2 by FDA under an Emergency Use Authorization (EUA). This EUA will remain  in effect (meaning this test can be used) for the duration of the COVID-19 declaration under Section 564(b)(1) of the Act, 21 U.S.C.section 360bbb-3(b)(1), unless the authorization is terminated  or revoked sooner.       Influenza A by PCR NEGATIVE NEGATIVE Final    Influenza B by PCR NEGATIVE NEGATIVE Final    Comment: (NOTE) The Xpert Xpress SARS-CoV-2/FLU/RSV plus assay is intended as an aid in the diagnosis of influenza from Nasopharyngeal swab specimens and should not be used as a sole basis for treatment. Nasal washings and aspirates are unacceptable for Xpert Xpress SARS-CoV-2/FLU/RSV testing.  Fact Sheet for Patients: EntrepreneurPulse.com.au  Fact Sheet for Healthcare Providers: IncredibleEmployment.be  This test is not yet approved or cleared by the [place] FDA and has been authorized for detection and/or  diagnosis of SARS-CoV-2 by FDA under an Emergency Use Authorization (EUA). This EUA will remain in effect (meaning this test can be used) for the duration of the COVID-19 declaration under Section 564(b)(1) of the Act, 21 U.S.C. section 360bbb-3(b)(1), unless the authorization is terminated or revoked.  Performed at [facility], [place] [address], [place], [place] [address]     Studies/Results: DG Chest Port 1 View  Result Date: 05/09/2020 CLINICAL DATA:  Lethargy and altered mental status. Questionable sepsis - evaluate for abnormality EXAM: PORTABLE CHEST 1 VIEW COMPARISON:  Radiograph 02/08/2020 FINDINGS: Right internal jugular central venous catheter tip at the atrial caval junction. Post median sternotomy and TAVR. Implanted loop recorder projects over the left chest wall. Lung volumes are low. Mild streaky atelectasis at the left greater than right lung base. No confluent consolidation. Stable heart size. Unchanged mediastinal contours with aortic atherosclerosis and tortuosity. Chronic cardiomegaly. No pulmonary edema. No large pleural effusion or pneumothorax. IMPRESSION: Low lung volumes with streaky atelectasis at the left greater than right lung base. Electronically Signed   By: Keith Rake M.D.   On: 05/09/2020 19:59   VAS [place] UPPER EXTREMITY VENOUS DUPLEX  Result Date:  05/11/2020 UPPER VENOUS STUDY  Indications: Swelling Risk Factors: None identified. Anticoagulation: Lovenox. Limitations: Patient cooperation and positioning. Comparison Study: No previous Upper venous duplex Performing Technologist: Vonzell Schlatter RVT  Examination Guidelines: A complete evaluation includes B-mode imaging, spectral Doppler, color Doppler, and power Doppler as needed of all accessible portions of each vessel. Bilateral testing is considered an integral part of a complete examination. Limited examinations for reoccurring indications may be performed as noted.  Left Findings: +----------+------------+---------+-----------+----------+--------------+ LEFT      CompressiblePhasicitySpontaneousProperties   Summary     +----------+------------+---------+-----------+----------+--------------+ IJV           Full       Yes       Yes                             +----------+------------+---------+-----------+----------+--------------+ Subclavian               Yes       Yes                             +----------+------------+---------+-----------+----------+--------------+ Axillary                 Yes       Yes                             +----------+------------+---------+-----------+----------+--------------+ Brachial      Full       Yes       Yes                             +----------+------------+---------+-----------+----------+--------------+ Radial        Full                                                 +----------+------------+---------+-----------+----------+--------------+ Ulnar  Not visualized +----------+------------+---------+-----------+----------+--------------+ Cephalic      Full                                                 +----------+------------+---------+-----------+----------+--------------+ Basilic                                             Not visualized  +----------+------------+---------+-----------+----------+--------------+  Summary:  Right: However, unable to visualize the Unable to see Ulnar or Basilic due to patient cooperation for positioning. Unable to follow directions.  Left: No evidence of deep vein thrombosis in the upper extremity. No evidence of superficial vein thrombosis in the upper extremity.  *See table(s) above for measurements and observations.    Preliminary       Assessment/Plan:  INTERVAL HISTORY: Patient continues to have struggles with low blood sugars and consequent confusion   Principal Problem:   Acute encephalopathy Active Problems:   DM2 (diabetes mellitus, type 2) (HCC)   Hypertension   Hypoglycemia   Benign hypertensive heart and kidney disease with diastolic CHF, NYHA class 3 and CKD stage 3 (HCC)   Sacral decubitus ulcer, stage IV (HCC)   Sepsis (HCC)   Catheter-associated urinary tract infection ([place])   Hypothermia   Decubitus ulcer of right heel, stage 3 ([place])    [name] is a 75 y.o. female with multiple medical problems and dementia with bedbound status and large sacral decubitus ulcer, history of pacemaker the became infected with group B streptococcus in the context of group B streptococcus bacteremia status post extraction of the pacemaker in 2021 with placement of new Med[employer]onic Micra.  Patient also underwent a TAVR.  (Note I have discussed her case via secure chat with both Dr. Curt Bears as well as the patient's cardiology PA Lattie Corns and they have told me that both the pacemaker and the T AVR are MRI safe)  More recently she was admitted to the hospital in February worsening of her sacral wound along with a diabetic foot ulcer with evidence of osteomyelitis on imaging.  She underwent surgery at the podiatry who removed a piece of bone from her calcaneus and sent this to the lab in the path lab no evidence of osteomyelitis was seen under the microscope.  No cultures were done  at that time.  She did have a prior culture from her wound that grew multiple organisms including Morganella.  Dr. Juleen China recommended a course of ertapenem which the patient was continuing on.  She was seen by Janene Madeira 2 days ago in clinic and appeared to be doing well.  Her inflammatories had been trending.  In the interim however she experienced increasing confusion hypothermia and hypotension in the setting context of hypoglycemia.  She was readmitted to the hospital blood cultures were taken urine cultures were taken and she was placed on broad-spectrum antibiotics with vancomycin and cefepime and then vancomycin and meropenem.  I had ordered an MRI of the foot but it was canceled again by radiology presumably because they do not think that she has Elana Alm compatible material.  Again I discussed with cardiology and both her valve and her device are pacemaker safe.  I do not know if she has other hardware that is an issue.  If not I would proceed with an MRI of her foot to evaluate the site.  Since her blood cultures are negative we will discontinue vancomycin  We will change meropenem back to the ertapenem that she was receiving previously.  If during this admission we did not find evidence of new osteomyelitis or undertreated osteomyelitis that will require further surgery and/or antibiotics it would be prudent to remove her central line while she is in the hospital because having radiology remove central lines as an outpatient is quite tedious and time-consuming and would likely lead to her having this line for longer than she needs it.  I spent greater than 35 minutes with the patient including greater than 50% of time in face to face counsel of the patient and in coordination of her care the primary team, and cardiology.   Dr. Gale Journey is on this weekend and available for questions.  I will ask him to check up on her studies and how she is doing.  I will be back on Monday   LOS: 2  days   Alcide Evener 05/11/2020, 12:15 PM

## 2020-05-11 NOTE — Progress Notes (Signed)
Left upper extremity venous study completed.     Please see CV Proc for preliminary results.   Vonzell Schlatter, RVT

## 2020-05-11 NOTE — Consult Note (Signed)
Consultation Note Date: 05/11/2020   Patient Name: [name]  DOB: March 22, 1945  MRN: [id number]  Age / Sex: 75 y.o., female   PCP: Hendricks Limes, MD Referring Physician: Florencia Reasons, MD   REASON FOR CONSULTATION:Establishing goals of care  Palliative Care consult requested for goals of care discussion in this 75 y.o. female with multiple medical problems including breast CA  s/p mastectomy (90's) & chemotherapy, CVA, AS s/p AVR (2006), TAVR (2021), hypertension, CKD 3, CHF, CAD, right lower extremity ischemia status post angiography (12/2019), and insulin dependent diabetes. She presented to the ED from [facility] facility with complaints of altered mental status. Followed by wound care outpatient due to sacral and heel decubitus. Patient is a Restaurant manager, fast food. Patient's blood glucose 29 on EMS arrival. Patient was hypothermic (95.6) and hypotensive (80s) on arrival. Patient initially placed on Bair hugger, D10 drip, and IV antibiotics. UA positive for UTI. Since admission appetite remains poor and family has requested trial period of Coretrak for tube feeding.   Clinical Assessment and Goals of Care: I have reviewed medical records including lab results, imaging, Epic notes, and MAR, received report from the bedside RN, and assessed the patient.   I spoke with patient's daughter Apolonio Schneiders [facility]) to discuss diagnosis prognosis, [place], EOL wishes, disposition and options.  Patient lethargic. Coretrack recently placed. Will not follow commands.   I introduced Palliative Medicine as specialized medical care for people living with serious illness. It focuses on providing relief from the symptoms and stress of a serious illness. The goal is to improve quality of life for both the patient and the family. Daughter verbalized understanding.   We discussed a brief life review of the patient, along with her functional and nutritional status. Daughter reports patient is divorced. She has  8 daughters. She is a retired Secretary/administrator and also worked at [employer]. She has been a dedicated Jehovah Witness for more than 40 years.   Family reports prior to admission patient resided at local SNF for the past 9 months. Shew as initially at [facility] facility and the last 4 months has been at [facility] facility. Patient has loss approximately 40 lbs over the past 6 months due to poor nutrition and infections per daughter. She was wheelchair bound 8 months ago however declined and since recent hospitalizations and has become bedbound. Daughter reports patient at baseline is alert and oriented. Able to engage in discussion.   We discussed Her current illness and what it means in the larger context of Her on-going co-morbidities. Natural disease trajectory and expectations at EOL were discussed.  A detailed discussion was had today regarding advanced directives.  Concepts specific to code status, artifical feeding and hydration, continued IV antibiotics and rehospitalization. The difference between a aggressive medical intervention and a palliative comfort care path were discussed at length. Values and goals of care important to patient and family were attempted to be elicited.   Patient does have a completed advanced directive naming her daughter [name] as Economist and daughter, [name] as back-up. I encouraged daughter to bring copy to place on file.   We discussed at length temporary trial with coretrak as requested. This has been placed and family is aware. Education provided on best case (baseline mentation/increased oral intake and desire for nutrition) and worst case scenario (no appetite, unable to meet nutrition requirements). Education also provided at length regarding long-term artificial feeding tubes/PEG as requested. Patient's daughter is a Quarry manager and  is familiar with some medical interventions. We discussed pros and cons of PEG placement with recommendations  against placement in the setting of multiple co-morbidities and no meaningful recovery or resolution of other medical concerns.   Daughter verbalized understanding. She is clear in expressed goals to continue with full code and full scope aggressive interventions. Rechel states she and her sisters would want to allow every opportunity for her mother to live, including heroic measures such as CPR and intubation.     I discussed the importance of continued conversation with family and Ms. Bilbo's medical providers regarding overall plan of care and treatment options, ensuring decisions are within the context of the patients values and GOCs. Encouraged daughter to consider patient's quality of life allowing this to be the center and guidance of all decision making.   Although patient would benefit for continued Palliative support at discharge I did not approach recommendations as daughter seemed somewhat fatigued and overwhelmed with discussion.   Questions and concerns were addressed.  The family was encouraged to call with questions or concerns.  PMT will continue to support holistically as needed.   CODE STATUS: Full code  ADVANCE DIRECTIVES: Primary Decision Maker: Hyman Hopes and [name].   SYMPTOM MANAGEMENT: per attending   Palliative Prophylaxis:   Aspiration, Bowel Regimen, Delirium Protocol, Eye Care, Frequent Pain Assessment, Oral Care, Palliative Wound Care and Turn Reposition  PSYCHO-SOCIAL/SPIRITUAL:  Support System: Family  Desire for further Chaplaincy support:No   Additional Recommendations (Limitations, Scope, Preferences):  Full Scope Treatment  Education on hospice/palliative    PAST MEDICAL HISTORY: Past Medical History:  Diagnosis Date  . Acute on chronic diastolic CHF (congestive heart failure) ([place]) 06/22/2017  . Acute on chronic renal failure ([place]) 05/25/2011  . Adenocarcinoma of breast ([place]) 1996   Completed tamoxifen and had mastectomy.   . Aortic stenosis, severe    s/p aortic valve replacement with porcine valve 06/2004.  ECHO 2010 EF 24%, LVH, diastolic dysfxn, Bioprostetic aoritc valve, mild AS. ECHO 2013 EF 60%, Nl aortic artificial valve, dynamic obstruction in the outflow tract   Class IIb rec for annual TTE after 5 yrs. She had a TTE 2013    . Arthritis   . CAD (coronary artery disease) 2006   s/p CABG (5/06) w/ saphenous vein to RCA at time of AVR  . CKD (chronic kidney disease) stage 2, GFR 60-89 ml/min 06/03/2011   She is no longer taking NSAIDs.   Marland Kitchen CVA (cerebral infarction) 2006   Post-op from AVR. Presumed embolic in nature. Carotid stenosis of R 60-79%. Repeat dopplers 4/10 no R stenosis and L stenosis of 1-29%.  . Depression    Controlled on Paxil  . Diabetes mellitus 1992   Dx 04/25/1990. Now insulin dependent, started 2008. On ACEI.   . Diverticulosis 2001  . History of 2019 novel coronavirus disease (COVID-19)   . Hyperlipidemia    Mgmt with a statin  . Hypertension    Requires 4 drug tx  . Osteoporosis 2006   DEXA 10/06 : L femur T -2.8, R -2.7. Lumbar T -2.4. On bisphosphonates and  Calcium / Vit D.  . Peripheral vascular disease ([place])   . Presence of permanent cardiac pacemaker   . Refusal of blood transfusions as patient is Jehovah's Witness   . Stroke [facility])     ALLERGIES:  is allergic to doxycycline and other.   MEDICATIONS:  Current Facility-Administered Medications  Medication Dose Route Frequency Provider Last Rate Last Admin  .  acetaminophen (TYLENOL) tablet 650 mg  650 mg Oral Q6H PRN Etta Quill, DO   650 mg at 05/10/20 1805   Or  . acetaminophen (TYLENOL) suppository 650 mg  650 mg Rectal Q6H PRN Etta Quill, DO      . Chlorhexidine Gluconate Cloth 2 % PADS 6 each  6 each Topical Daily Etta Quill, DO   6 each at 05/11/20 0510  . collagenase (SANTYL) ointment   Topical Daily Florencia Reasons, MD   Given at 05/10/20 1054  . dextrose 10 % infusion   Intravenous Continuous Florencia Reasons, MD 50 mL/hr at 05/11/20 0921 Rate Change at 05/11/20 0921  . enoxaparin (LOVENOX) injection 40 mg  40 mg Subcutaneous QHS Jennette Kettle M, DO   40 mg at 05/10/20 2256  . ertapenem (INVANZ) 1,000 mg in sodium chloride 0.9 % 100 mL IVPB  1 g Intravenous Q24H Tommy Medal, Lavell Islam, MD      . feeding supplement (OSMOLITE 1.5 CAL) liquid 1,000 mL  1,000 mL Per Tube Continuous Florencia Reasons, MD      . feeding supplement (PROSource TF) liquid 45 mL  45 mL Per Tube TID Florencia Reasons, MD      . ondansetron [facility]) tablet 4 mg  4 mg Oral Q6H PRN Etta Quill, DO       Or  . ondansetron (ZOFRAN) injection 4 mg  4 mg Intravenous Q6H PRN Etta Quill, DO        VITAL SIGNS: BP (!) 105/47 (BP Location: Left Arm)   Pulse 64   Temp 98.8 F (37.1 C) (Oral)   Resp 18   Ht $R'5\' 6"'eg$  (1.676 m)   Wt 62.1 kg   SpO2 100%   BMI 22.10 kg/m  Filed Weights   05/09/20 1916  Weight: 62.1 kg    Estimated body mass index is 22.1 kg/m as calculated from the following:   Height as of this encounter: $RemoveBeforeD'5\' 6"'OXiIvAZpKeTfWd$  (1.676 m).   Weight as of this encounter: 62.1 kg.  LABS: CBC:    Component Value Date/Time   WBC 11.8 (H) 05/11/2020 0500   HGB 7.0 (L) 05/11/2020 0500   HGB 12.0 09/01/2018 1142   HCT 20.8 (L) 05/11/2020 0500   HCT 36.8 09/01/2018 1142   PLT 160 05/11/2020 0500   PLT 265 09/01/2018 1142   Comprehensive Metabolic Panel:    Component Value Date/Time   NA 137 05/11/2020 0500   NA 136 (A) 02/15/2020 0000   K 4.0 05/11/2020 0500   BUN 13 05/11/2020 0500   BUN 101 (A) 02/15/2020 0000   CREATININE 1.21 (H) 05/11/2020 0500   CREATININE 0.92 06/07/2014 1151   ALBUMIN <1.0 (L) 05/11/2020 0500   ALBUMIN 4.2 09/01/2018 1142     Review of Systems  Unable to perform ROS: Acuity of condition  . Physical Exam General: NAD, lethargic, chronically-ill appearing Cardiovascular: Paced Pulmonary: clear ant fields, diminished bilaterally  Abdomen: soft, nontender, + bowel sounds, Coretrak now in  place Skin: no rashes, warm and dry, reported sacral ulcer and right heel ulcer (not observed) Neurological: lethargic, will briefly open eyes, no verbal response   Prognosis: Guarded  Discharge Planning:  To Be Determined  Recommendations: . Full Code-as confirmed by daughter/HCPOA . Continue with current plan of care. Daughter is requesting full scope/aggressive interventions . Family is remaining hopeful for some stability/improvement in the setting of Coretrack. Detailed education and discussion regarding best case and worst case scenario. Family would like  to allow every opportunity for patient to live. Would consider PEG if no improvement and is not interested in a comfort path.  . Patient would benefit from outpatient Palliative support. Will further discuss with daughter.   Marland Kitchen PMT will continue to support and follow as needed. Please call team line with urgent needs.   Palliative Performance Scale: PPS 20% (Coretrack placed)              Daughter, Rechel expressed understanding and was in agreement with this plan.   Thank you for allowing the Palliative Medicine Team to assist in the care of this patient. Please utilize secure chat with additional questions, if there is no response within 30 minutes please call the above phone number.   Time In: 1430 Time Out: 1520 Time Total: 50 min.   Visit consisted of counseling and education dealing with the complex and emotionally intense issues of symptom management and palliative care in the setting of serious and potentially life-threatening illness.Greater than 50%  of this time was spent counseling and coordinating care related to the above assessment and plan.  Signed by:  Alda Lea, AGPCNP-BC Palliative Medicine Team  Phone: [phone] Pager: [phone] Amion: [place] Team providers are available by phone from 7am to 7pm daily and can be reached through the team cell phone.  Should  this patient require assistance outside of these hours, please call the patient's attending physician.

## 2020-05-11 NOTE — Progress Notes (Addendum)
PROGRESS NOTE    [name]  JHE:[id number] DOB: October 13, 1945 DOA: 05/09/2020 PCP: Hendricks Limes, MD    Chief Complaint  Patient presents with  . Altered Mental Status  . Hypoglycemia    Brief Narrative:  [name] is a 75 y.o. female with medical history significant of breast CA in 1990's  s/p mastectomy, chemotherapy,  CVA, AS s/p AVR in 2006, then TAVR in 2021, HFpEF + CHB + PPM (placed 01/2019)-bacteremia group B strep 06/2019 status post PPM explantation and then subsequent Medtronic Micra, h/o    right lower extremity ischemia status post angiography in 12/2019, h/o insulin dependent diabetes , Pt admitted to hospital 2/8 - 2/16 with critical ischemia of R leg and open ulcer of heel growing Morganella morganii on culture.  Other issues included Sacral decubitus ulcer, and urinary retention.Patient was discharged on Invanz through March 25th   FTT has been at nursing home for about a year, family noticed progressive decline for about 46-month, with poor oral intake, weight loss of 30 to 40 pounds, has been bedbound, needing to be fed,  She was brought to the ED from nursing home due to altered mental status, found to have hypoglycemia, blood glucose 29, hypothermia and hypotension  Subjective: Has hypoglycemia event early this am Patient is lethargic, RN reports she does not eat , even the family brought her food and tried to feed her She is off Retail banker ,currently no hypothermia  she denies pain Urine with sediment  Family is not at bedside   Assessment & Plan:   Principal Problem:   Toxic metabolic encephalopathy Active Problems:   DM2 (diabetes mellitus, type 2) ([place])   Hypertension   Hypoglycemia   Benign hypertensive heart and kidney disease with diastolic CHF, NYHA class 3 and CKD stage 3 ([place])   Sacral decubitus ulcer, stage IV ([facility])   Sepsis ([place])   Catheter-associated urinary tract infection ([place])   Hypothermia   Decubitus ulcer of right heel,  stage 3 ([place])   Anasarca   Protein-calorie malnutrition, severe  Acute metabolic encephalopathy,  there is documentation of encephalopathy during last hospitalization as well She does has history of CVA, I wonder if she has underlying dementia Apparently patient has acute mental status changes, per daughter patient's alert and oriented x3 at baseline she continue to be lethargic  cortrack feeding tube ordered   Hypoglycemia with history of insulin-dependent type 2 diabetes Blood glucose improved on D10 at 150cc/hr , has hypoglycemia again after decreasing infusion rate, will increase to 50cc/hr, continue q2hrs monitoring  cortrak feeding tube ordered Continue hold insulin  History of hypertension, present with hypotension Hold Lopressor Blood pressure improved  Hypothermia Body temperature improved with Bair hugger Now off bair hugger   Possible sepsis, present with hypothermia , hypotension , leukocytosis , lactic acidosis with multiple possible source of infection including UTI, sacral decubitus ulcer and heel ulcer, she also has indwelling tunneled line She has been on Invanz prior to admission  collect urine culture, follow-up blood culture result Indwelling Foley changed out in the ED She is started on broad-spectrum antibiotics Lactic acid normalized, hypothermia resolved, wbc improving, blood pressure improving ID consulted, will follow recommendation   CKD 3 a/anemia of chronic disease Monitor renal function, renal dosing medication Pt is a Jehovah's witness.  PVD right lower extremity ischemia status post angiography in 12/2019 Aspirin/Plavix/statin held due to not able to take oral meds from altered mental status Daughter agreed to temporary core track  feeding tube if patient does not improve by tomorrow   H/o CVA, AS s/p AVR in 2006, then TAVR in 2021, HFpEF + CHB + PPM (placed 01/2019)-bacteremia group B strep 06/2019 status post PPM explantation and then  subsequent Medtronic Micra  H/o  breast CA in 1990's  s/p left mastectomy, chemotherapy  FTT/malnutrition/weight loss of 40 pounds in the last 67-month, has been bedbound for about 4 months per daughter Albumin 1.2 Nutrition consult, daughter agreed to palliative care consult    Body mass index is 22.1 kg/m.Marland Kitchen   Skin Assessment: I have examined the patient's skin and I agree with the wound assessment as performed by the wound care RN as outlined below: Pressure Injury 05/10/20 Sacrum Stage 4 - Full thickness tissue loss with exposed bone, tendon or muscle. (Active)  05/10/20   Location: Sacrum  Location Orientation:   Staging: Stage 4 - Full thickness tissue loss with exposed bone, tendon or muscle.  Wound Description (Comments):   Present on Admission: Yes     Pressure Injury 05/10/20 Heel Right Stage 4 - Full thickness tissue loss with exposed bone, tendon or muscle. (Active)  05/10/20   Location: Heel  Location Orientation: Right  Staging: Stage 4 - Full thickness tissue loss with exposed bone, tendon or muscle.  Wound Description (Comments):   Present on Admission: Yes    Unresulted Labs (From admission, onward)          Start     Ordered   05/12/20 0500  CBC  Tomorrow morning,   R       Question:  Specimen collection method  Answer:  Unit=Unit collect   05/11/20 0934   05/12/20 0938  Basic metabolic panel  Daily,   R     Question:  Specimen collection method  Answer:  Unit=Unit collect   05/11/20 0934   05/12/20 0500  Magnesium  Tomorrow morning,   R       Question:  Specimen collection method  Answer:  Unit=Unit collect   05/11/20 0934   05/12/20 0500  Phosphorus  Tomorrow morning,   R       Question:  Specimen collection method  Answer:  Unit=Unit collect   05/11/20 0934   05/10/20 0656  Culture, Urine  Once,   STAT        05/10/20 0656   05/09/20 1906  Blood Culture (routine x 2)  (Septic presentation on arrival (screening labs, nursing and treatment orders for  obvious sepsis))  BLOOD CULTURE X 2,   STAT      05/09/20 1908   05/09/20 1906  Urine culture  (Septic presentation on arrival (screening labs, nursing and treatment orders for obvious sepsis))  ONCE - STAT,   STAT        05/09/20 1908            DVT prophylaxis: enoxaparin (LOVENOX) injection 40 mg Start: 05/09/20 2315   Code Status:full Family Communication: non at bedside this am Disposition:   Status is: Inpatient   Dispo: The patient is from: SNF              Anticipated d/c is to: SNF              Anticipated d/c date is: TBD                Consultants:   ID  Palliative care  Wound care  Procedures:   None  Antimicrobials:   Anti-infectives (From admission, onward)  Start     Dose/Rate Route Frequency Ordered Stop   05/11/20 2200  ertapenem (INVANZ) 1,000 mg in sodium chloride 0.9 % 100 mL IVPB        1 g 200 mL/hr over 30 Minutes Intravenous Every 24 hours 05/11/20 0959     05/10/20 2100  vancomycin (VANCOREADY) IVPB 1000 mg/200 mL  Status:  Discontinued        1,000 mg 200 mL/hr over 60 Minutes Intravenous Every 24 hours 05/09/20 2040 05/11/20 1124   05/10/20 0800  ceFEPIme (MAXIPIME) 2 g in sodium chloride 0.9 % 100 mL IVPB  Status:  Discontinued        2 g 200 mL/hr over 30 Minutes Intravenous Every 12 hours 05/09/20 2040 05/09/20 2146   05/09/20 2300  meropenem (MERREM) 2 g in sodium chloride 0.9 % 100 mL IVPB        2 g 200 mL/hr over 30 Minutes Intravenous Every 12 hours 05/09/20 2146 05/11/20 1034   05/09/20 2000  vancomycin (VANCOREADY) IVPB 1000 mg/200 mL        1,000 mg 200 mL/hr over 60 Minutes Intravenous  Once 05/09/20 1908 05/09/20 2157   05/09/20 1915  ceFEPIme (MAXIPIME) 2 g in sodium chloride 0.9 % 100 mL IVPB        2 g 200 mL/hr over 30 Minutes Intravenous  Once 05/09/20 1908 05/09/20 1959   05/09/20 1915  metroNIDAZOLE (FLAGYL) IVPB 500 mg        500 mg 100 mL/hr over 60 Minutes Intravenous  Once 05/09/20 1908 05/09/20 2056           Objective: Vitals:   05/10/20 1926 05/10/20 2317 05/11/20 0344 05/11/20 0856  BP: 124/74 (!) 121/52 100/65 (!) 105/47  Pulse: 63 62 69 64  Resp: $Remo'18 18 14 18  'OJDee$ Temp: 98.3 F (36.8 C) 98.4 F (36.9 C) 99.2 F (37.3 C) 98.8 F (37.1 C)  TempSrc: Oral Oral Oral Oral  SpO2: 100% 100% 100% 100%  Weight:      Height:        Intake/Output Summary (Last 24 hours) at 05/11/2020 1451 Last data filed at 05/11/2020 0856 Gross per 24 hour  Intake 953.67 ml  Output 1525 ml  Net -571.33 ml   Filed Weights   05/09/20 1916  Weight: 62.1 kg    Examination:  General exam: Very lethargic, opens eyes briefly to question, knows she is in the hospital, not oriented to time Respiratory system: Clear to auscultation. Respiratory effort normal. Cardiovascular system: Paced rhythm.  Gastrointestinal system: Abdomen is nondistended, soft and nontender.  Normal bowel sounds heard. Central nervous system: Very lethargic, opens eyes briefly to question, knows she is in the hospital, not oriented to time Extremities: Does not follow command, does move upper extremities, no movement in lower extremities noticed Skin: Sacral decubitus ulcers, right heel ulcers Psychiatry: Lethargic, no agitation.     Data Reviewed: I have personally reviewed following labs and imaging studies  CBC: Recent Labs  Lab 05/09/20 1906 05/10/20 0924 05/11/20 0500  WBC 11.2* 14.7* 11.8*  NEUTROABS 9.2*  --  8.1*  HGB 9.6* 7.5* 7.0*  HCT 29.8* 23.3* 20.8*  MCV 94.9 94.3 92.9  PLT 215 165 008    Basic Metabolic Panel: Recent Labs  Lab 05/09/20 1906 05/10/20 0924 05/11/20 0500  NA 137 136 137  K 3.7 3.4* 4.0  CL 109 108 108  CO2 21* 22 22  GLUCOSE 34* 173* 62*  BUN 14 12 13  CREATININE 1.28* 1.12* 1.21*  CALCIUM 7.7* 7.1* 7.2*  MG  --   --  1.6*    GFR: Estimated Creatinine Clearance: 37 mL/min (A) (by C-G formula based on SCr of 1.21 mg/dL (H)).  Liver Function Tests: Recent Labs  Lab  05/09/20 1906 05/11/20 0500  AST 43* 31  ALT 24 22  ALKPHOS 168* 127*  BILITOT 0.8 1.0  PROT 5.6* 4.1*  ALBUMIN 1.2* <1.0*    CBG: Recent Labs  Lab 05/11/20 0347 05/11/20 0616 05/11/20 0649 05/11/20 0925 05/11/20 0958  GLUCAP 77 46* 128* 68* 134*     Recent Results (from the past 240 hour(s))  Blood Culture (routine x 2)     Status: None (Preliminary result)   Collection Time: 05/09/20  7:25 PM   Specimen: BLOOD  Result Value Ref Range Status   Specimen Description BLOOD SITE NOT SPECIFIED  Final   Special Requests   Final    BOTTLES DRAWN AEROBIC AND ANAEROBIC Blood Culture adequate volume   Culture   Final    NO GROWTH 1 DAY Performed at [facility], [place] [address] [place], [place] [address]    Report Status PENDING  Incomplete  Resp Panel by RT-PCR (Flu A&B, Covid) Nasopharyngeal Swab     Status: None   Collection Time: 05/09/20  7:30 PM   Specimen: Nasopharyngeal Swab; Nasopharyngeal(NP) swabs in vial transport medium  Result Value Ref Range Status   SARS Coronavirus 2 by RT PCR NEGATIVE NEGATIVE Final    Comment: (NOTE) SARS-CoV-2 target nucleic acids are NOT DETECTED.  The SARS-CoV-2 RNA is generally detectable in upper respiratory specimens during the acute phase of infection. The lowest concentration of SARS-CoV-2 viral copies this assay can detect is 138 copies/mL. A negative result does not preclude SARS-Cov-2 infection and should not be used as the sole basis for treatment or other patient management decisions. A negative result may occur with  improper specimen collection/handling, submission of specimen other than nasopharyngeal swab, presence of viral mutation(s) within the areas targeted by this assay, and inadequate number of viral copies(<138 copies/mL). A negative result must be combined with clinical observations, patient history, and epidemiological information. The expected result is Negative.  Fact Sheet for Patients:   EntrepreneurPulse.com.au  Fact Sheet for Healthcare Providers:  IncredibleEmployment.be  This test is no t yet approved or cleared by the [place] FDA and  has been authorized for detection and/or diagnosis of SARS-CoV-2 by FDA under an Emergency Use Authorization (EUA). This EUA will remain  in effect (meaning this test can be used) for the duration of the COVID-19 declaration under Section 564(b)(1) of the Act, 21 U.S.C.section 360bbb-3(b)(1), unless the authorization is terminated  or revoked sooner.       Influenza A by PCR NEGATIVE NEGATIVE Final   Influenza B by PCR NEGATIVE NEGATIVE Final    Comment: (NOTE) The Xpert Xpress SARS-CoV-2/FLU/RSV plus assay is intended as an aid in the diagnosis of influenza from Nasopharyngeal swab specimens and should not be used as a sole basis for treatment. Nasal washings and aspirates are unacceptable for Xpert Xpress SARS-CoV-2/FLU/RSV testing.  Fact Sheet for Patients: EntrepreneurPulse.com.au  Fact Sheet for Healthcare Providers: IncredibleEmployment.be  This test is not yet approved or cleared by the [place] FDA and has been authorized for detection and/or diagnosis of SARS-CoV-2 by FDA under an Emergency Use Authorization (EUA). This EUA will remain in effect (meaning this test can be used) for the duration of the COVID-19 declaration  under Section 564(b)(1) of the Act, 21 U.S.C. section 360bbb-3(b)(1), unless the authorization is terminated or revoked.  Performed at [facility], [place] [address] [place], [place] [address]          Radiology Studies: DG Chest Port 1 View  Result Date: 05/09/2020 CLINICAL DATA:  Lethargy and altered mental status. Questionable sepsis - evaluate for abnormality EXAM: PORTABLE CHEST 1 VIEW COMPARISON:  Radiograph 02/08/2020 FINDINGS: Right internal jugular central venous catheter tip at the atrial  caval junction. Post median sternotomy and TAVR. Implanted loop recorder projects over the left chest wall. Lung volumes are low. Mild streaky atelectasis at the left greater than right lung base. No confluent consolidation. Stable heart size. Unchanged mediastinal contours with aortic atherosclerosis and tortuosity. Chronic cardiomegaly. No pulmonary edema. No large pleural effusion or pneumothorax. IMPRESSION: Low lung volumes with streaky atelectasis at the left greater than right lung base. Electronically Signed   By: Keith Rake M.D.   On: 05/09/2020 19:59   DG Abd Portable 1V  Result Date: 05/11/2020 CLINICAL DATA:  Status post feeding tube placement. EXAM: PORTABLE ABDOMEN - 1 VIEW COMPARISON:  None. FINDINGS: Feeding tube is in place with the tip at the ligament of Treitz. IMPRESSION: Feeding tube in good position. Electronically Signed   By: Inge Rise M.D.   On: 05/11/2020 13:22   VAS [place] UPPER EXTREMITY VENOUS DUPLEX  Result Date: 05/11/2020 UPPER VENOUS STUDY  Indications: Swelling Risk Factors: None identified. Anticoagulation: Lovenox. Limitations: Patient cooperation and positioning. Comparison Study: No previous Upper venous duplex Performing Technologist: Vonzell Schlatter RVT  Examination Guidelines: A complete evaluation includes B-mode imaging, spectral Doppler, color Doppler, and power Doppler as needed of all accessible portions of each vessel. Bilateral testing is considered an integral part of a complete examination. Limited examinations for reoccurring indications may be performed as noted.  Left Findings: +----------+------------+---------+-----------+----------+--------------+ LEFT      CompressiblePhasicitySpontaneousProperties   Summary     +----------+------------+---------+-----------+----------+--------------+ IJV           Full       Yes       Yes                             +----------+------------+---------+-----------+----------+--------------+  Subclavian               Yes       Yes                             +----------+------------+---------+-----------+----------+--------------+ Axillary                 Yes       Yes                             +----------+------------+---------+-----------+----------+--------------+ Brachial      Full       Yes       Yes                             +----------+------------+---------+-----------+----------+--------------+ Radial        Full                                                 +----------+------------+---------+-----------+----------+--------------+  Ulnar                                               Not visualized +----------+------------+---------+-----------+----------+--------------+ Cephalic      Full                                                 +----------+------------+---------+-----------+----------+--------------+ Basilic                                             Not visualized +----------+------------+---------+-----------+----------+--------------+  Summary:  Right: However, unable to visualize the Unable to see Ulnar or Basilic due to patient cooperation for positioning. Unable to follow directions.  Left: No evidence of deep vein thrombosis in the upper extremity. No evidence of superficial vein thrombosis in the upper extremity.  *See table(s) above for measurements and observations.    Preliminary         Scheduled Meds: . Chlorhexidine Gluconate Cloth  6 each Topical Daily  . collagenase   Topical Daily  . enoxaparin (LOVENOX) injection  40 mg Subcutaneous QHS  . feeding supplement (PROSource TF)  45 mL Per Tube TID   Continuous Infusions: . dextrose 50 mL/hr at 05/11/20 0921  . ertapenem    . feeding supplement (OSMOLITE 1.5 CAL)       LOS: 2 days   Time spent: 85mins Greater than 50% of this time was spent in counseling, explanation of diagnosis, planning of further management, and coordination of care.   Voice  Recognition Viviann Spare dictation system was used to create this note, attempts have been made to correct errors. Please contact the author with questions and/or clarifications.   Florencia Reasons, MD PhD FACP [facility]  Available via Epic secure chat 7am-7pm for nonurgent issues Please page for urgent issues To page the attending provider between 7A-7P or the covering provider during after hours 7P-7A, please log into the web site [email or web address] and access using universal [place] password for that web site. If you do not have the password, please call the hospital operator.    05/11/2020, 2:51 PM

## 2020-05-11 NOTE — Progress Notes (Signed)
Mobility Specialist: Progress Note   05/11/20 1140  Mobility  Activity  (Cancel)   Instructed by RN to not see pt today.   [facility] Kirby Cortese Mobility Specialist Mobility Specialist Phone: [phone]

## 2020-05-11 NOTE — Progress Notes (Signed)
Patient to MRI with floor RN for foot (right) w/wo contrast. Patient has medtronic device. [employer] express sent to Mike-medtronic rep and Renee-Cardiology PA. Orders received for VOO 90. Will re-program once scan is complete.

## 2020-05-11 NOTE — Progress Notes (Signed)
Hypoglycemic Event  CBG: 68  Treatment: 1 amp dextrose 50%  Symptoms: lethargic  Follow-up CBG: Time: 0958 CBG Result: 134  Possible Reasons for Event: poor oral intake  Comments/MD notified: Dr. Erlinda Hong at bedside    [place]

## 2020-05-11 NOTE — Procedures (Addendum)
Cortrak  Person Inserting Tube:  Leana Springston, Creola Corn, RD Tube Type:  Cortrak - 43 inches Tube Location:  Left nare Initial Placement:  Postpyloric Secured by: Bridle Technique Used to Measure Tube Placement:  Documented cm marking at nare/ corner of mouth Cortrak Secured At:  91 cm    Cortrak Tube Team Note:  Consult received to place a Cortrak feeding tube.    X-ray is required, abdominal x-ray has been ordered by the Cortrak team. Please confirm tube placement before using the Cortrak tube.   Xray has been read -- "Feeding tube is in place with the tip at the ligament of Treitz." Feeding tube is ready for use.  If the tube becomes dislodged please keep the tube and contact the Cortrak team at [email or web address] (password [email or web address]) for replacement.  If after hours and replacement cannot be delayed, place a NG tube and confirm placement with an abdominal x-ray.    Larkin Ina, MS, RD, LDN RD pager number and weekend/on-call pager number located in [place].

## 2020-05-12 DIAGNOSIS — T83511A Infection and inflammatory reaction due to indwelling urethral catheter, initial encounter: Secondary | ICD-10-CM | POA: Diagnosis not present

## 2020-05-12 DIAGNOSIS — I130 Hypertensive heart and chronic kidney disease with heart failure and stage 1 through stage 4 chronic kidney disease, or unspecified chronic kidney disease: Secondary | ICD-10-CM | POA: Diagnosis not present

## 2020-05-12 DIAGNOSIS — G9340 Encephalopathy, unspecified: Secondary | ICD-10-CM | POA: Diagnosis not present

## 2020-05-12 DIAGNOSIS — R601 Generalized edema: Secondary | ICD-10-CM | POA: Diagnosis not present

## 2020-05-12 LAB — CBC
HCT: 20.3 % — ABNORMAL LOW (ref 36.0–46.0)
Hemoglobin: 6.9 g/dL — CL (ref 12.0–15.0)
MCH: 31.8 pg (ref 26.0–34.0)
MCHC: 34 g/dL (ref 30.0–36.0)
MCV: 93.5 fL (ref 80.0–100.0)
Platelets: 152 10*3/uL (ref 150–400)
RBC: 2.17 MIL/uL — ABNORMAL LOW (ref 3.87–5.11)
RDW: 17.2 % — ABNORMAL HIGH (ref 11.5–15.5)
WBC: 11.3 10*3/uL — ABNORMAL HIGH (ref 4.0–10.5)
nRBC: 0 % (ref 0.0–0.2)

## 2020-05-12 LAB — URINE CULTURE: Culture: 8000 — AB

## 2020-05-12 LAB — BASIC METABOLIC PANEL
Anion gap: 5 (ref 5–15)
BUN: 11 mg/dL (ref 8–23)
CO2: 23 mmol/L (ref 22–32)
Calcium: 6.8 mg/dL — ABNORMAL LOW (ref 8.9–10.3)
Chloride: 105 mmol/L (ref 98–111)
Creatinine, Ser: 1.16 mg/dL — ABNORMAL HIGH (ref 0.44–1.00)
GFR, Estimated: 49 mL/min — ABNORMAL LOW (ref >60)
Glucose, Bld: 387 mg/dL — ABNORMAL HIGH (ref 70–99)
Potassium: 3.1 mmol/L — ABNORMAL LOW (ref 3.5–5.1)
Sodium: 133 mmol/L — ABNORMAL LOW (ref 135–145)

## 2020-05-12 LAB — GLUCOSE, CAPILLARY
Glucose-Capillary: 141 mg/dL — ABNORMAL HIGH (ref 70–99)
Glucose-Capillary: 142 mg/dL — ABNORMAL HIGH (ref 70–99)
Glucose-Capillary: 147 mg/dL — ABNORMAL HIGH (ref 70–99)
Glucose-Capillary: 149 mg/dL — ABNORMAL HIGH (ref 70–99)
Glucose-Capillary: 159 mg/dL — ABNORMAL HIGH (ref 70–99)
Glucose-Capillary: 161 mg/dL — ABNORMAL HIGH (ref 70–99)
Glucose-Capillary: 169 mg/dL — ABNORMAL HIGH (ref 70–99)
Glucose-Capillary: 175 mg/dL — ABNORMAL HIGH (ref 70–99)

## 2020-05-12 LAB — MAGNESIUM: Magnesium: 1.8 mg/dL (ref 1.7–2.4)

## 2020-05-12 LAB — PHOSPHORUS: Phosphorus: 1.8 mg/dL — ABNORMAL LOW (ref 2.5–4.6)

## 2020-05-12 MED ORDER — POTASSIUM CHLORIDE 20 MEQ PO PACK
40.0000 meq | PACK | ORAL | Status: AC
Start: 2020-05-12 — End: 2020-05-12
  Administered 2020-05-12 (×2): 40 meq
  Filled 2020-05-12 (×2): qty 2

## 2020-05-12 MED ORDER — POTASSIUM & SODIUM PHOSPHATES 280-160-250 MG PO PACK
1.0000 | PACK | Freq: Three times a day (TID) | ORAL | Status: DC
  Administered 2020-05-12 – 2020-05-13 (×4): 1 via JEJUNOSTOMY
  Filled 2020-05-12 (×6): qty 1

## 2020-05-12 MED ORDER — INSULIN ASPART 100 UNIT/ML ~~LOC~~ SOLN
0.0000 [IU] | SUBCUTANEOUS | Status: DC
  Administered 2020-05-12 – 2020-05-17 (×19): 1 [IU] via SUBCUTANEOUS

## 2020-05-12 MED ORDER — MAGNESIUM SULFATE 2 GM/50ML IV SOLN
2.0000 g | Freq: Once | INTRAVENOUS | Status: AC
  Administered 2020-05-12: 2 g via INTRAVENOUS
  Filled 2020-05-12: qty 50

## 2020-05-12 MED ORDER — CLOPIDOGREL BISULFATE 75 MG PO TABS
75.0000 mg | ORAL_TABLET | Freq: Every day | ORAL | Status: DC
  Administered 2020-05-13 – 2020-05-16 (×4): 75 mg
  Filled 2020-05-12 (×4): qty 1

## 2020-05-12 MED ORDER — MIRTAZAPINE 15 MG PO TABS
15.0000 mg | ORAL_TABLET | Freq: Every day | ORAL | Status: DC
  Administered 2020-05-12 – 2020-05-16 (×5): 15 mg
  Filled 2020-05-12 (×5): qty 1

## 2020-05-12 NOTE — Progress Notes (Addendum)
PROGRESS NOTE    [name]  OHY:[id number] DOB: 03/14/1945 DOA: 05/09/2020 PCP: Hendricks Limes, MD    Chief Complaint  Patient presents with  . Altered Mental Status  . Hypoglycemia    Brief Narrative:  [name] is a 75 y.o. female with medical history significant of breast CA in 1990's  s/p mastectomy, chemotherapy,  CVA, AS s/p AVR in 2006, then TAVR in 2021, HFpEF + CHB + PPM (placed 01/2019)-bacteremia group B strep 06/2019 status post PPM explantation and then subsequent Medtronic Micra, h/o    right lower extremity ischemia status post angiography in 12/2019, h/o insulin dependent diabetes , Pt admitted to hospital 2/8 - 2/16 with critical ischemia of R leg and open ulcer of heel growing Morganella morganii on culture.  Other issues included Sacral decubitus ulcer, and urinary retention.Patient was discharged on Invanz through March 25th   FTT has been at nursing home for about a year, family noticed progressive decline for about 88-month, with poor oral intake, weight loss of 30 to 40 pounds, has been bedbound, needing to be fed,  She was brought to the ED from nursing home due to altered mental status, found to have hypoglycemia, blood glucose 29, hypothermia and hypotension  Subjective:  She is started on tube feeds, blood glucose starts to rise, she is now hyperglycemia, will DC D10 infusion Hypothermia ,hypotension has resolved  Although still lethargic , she has improved compared to yesterday , she is oriented x3 , able to carry short conversation before drift back to sleep   She report does not feel hungry, she tolerated tube feeds   she denies pain  Assessment & Plan:   Principal Problem:   Toxic metabolic encephalopathy Active Problems:   DM2 (diabetes mellitus, type 2) ([place])   Hypertension   Hypoglycemia   Benign hypertensive heart and kidney disease with diastolic CHF, NYHA class 3 and CKD stage 3 ([place])   Sacral decubitus ulcer, stage IV  (H[facility])   Sepsis ([place])   Catheter-associated urinary tract infection ([place])   Hypothermia   Decubitus ulcer of right heel, stage 3 ([place])   Anasarca   Protein-calorie malnutrition, severe  Acute metabolic encephalopathy, present on admission -She was very lethargic not oriented to time on presentation, this has improved, although still lethargic she is oriented x3 today, she does appear to have memory impairment,  I wonder if there is underlying dementia   Hypoglycemia with history of insulin-dependent type 2 diabetes -She is found to have a blood glucose of 29 , EMS called , she was brought from nursing home to [facility] -initially on D10 at 150cc/hr then 50 cc/h -Restarted on tube feeds, blood glucose start to rise, DC D10 infusion -Continue tube feeds, monitor blood glucose, may need to switch to Glucerna from [place]sm[place] if blood glucose start to rise -Insulin held on admission, will start sliding scale (use very sensitive scale) today   History of hypertension, present with hypotension Blood pressure improved and normalized, home blood pressure Lopressor held on presentation, continue hold today  Hypothermia Body temperature improved with Bair hugger Now off bair hugger  Normal body temperature today  Possible sepsis, present with hypothermia , hypotension , leukocytosis , lactic acidosis with multiple possible sources of infection including UTI, sacral decubitus ulcer and heel ulcer, she also has indwelling tunneled line -She has been on Invanz prior to admission, make culture result challenging  -So far blood culture no growth , urine culture in process  -  Indwelling Foley changed out in the ED -She is started on broad-spectrum antibiotics vancomycin and meropenem -Lactic acid normalized, hypothermia resolved, wbc improving, blood pressure improving -ID consulted input appreciated, Vanco and meropenem discontinued, she is back on Invanz, may need to pull tunneled line if  rule out overt infection, will follow ID recommendation   CKD 3 a/anemia of chronic disease Monitor renal function, renal dosing medication Pt is a Jehovah's witness.  PVD right lower extremity ischemia status post angiography in 12/2019 Aspirin/Plavix/statin held initially due to not taking oral meds from altered mental status Now meds per tube  HLD Repeat lipid panel LDL is only 9, triglyceride is 69,  I have reviewed her past record, LDL has been overall trending down, as well as total cholesterol, triglyceride and VLDL This is likely due to malnutrition Need to decrease statin  H/o CVA,  H/o AS s/p AVR in 2006, then TAVR in 2021,  H/o HFpEF + CHB + PPM  H/o bacteremia group B strep 06/2019 status post PPM explantation and then subsequent Medtronic Micra placement On Aspirin/Plavix/statin   H/o  breast CA in 1990's  s/p left mastectomy, chemotherapy  FTT/malnutrition/weight loss of 40 pounds in the last 3-month, has been bedbound for about 4 months per daughter Albumin 1.2 Nutrition consult,  palliative care consult  Hyponatremia DC D10  hypokalemia/hypophosphatemia/hypomagnesemia Replace    Body mass index is 22.1 kg/m.Marland Kitchen   Skin Assessment: I have examined the patient's skin and I agree with the wound assessment as performed by the wound care RN as outlined below: Pressure Injury 05/10/20 Sacrum Stage 4 - Full thickness tissue loss with exposed bone, tendon or muscle. (Active)  05/10/20   Location: Sacrum  Location Orientation:   Staging: Stage 4 - Full thickness tissue loss with exposed bone, tendon or muscle.  Wound Description (Comments):   Present on Admission: Yes     Pressure Injury 05/10/20 Heel Right Stage 4 - Full thickness tissue loss with exposed bone, tendon or muscle. (Active)  05/10/20   Location: Heel  Location Orientation: Right  Staging: Stage 4 - Full thickness tissue loss with exposed bone, tendon or muscle.  Wound Description (Comments):    Present on Admission: Yes    Unresulted Labs (From admission, onward)          Start     Ordered   05/13/20 0500  Vitamin B12  Tomorrow morning,   R       Question:  Specimen collection method  Answer:  Unit=Unit collect   05/12/20 0826   05/13/20 0500  Folate, serum, performed at [place] morning,   R       Question:  Specimen collection method  Answer:  Unit=Unit collect   05/12/20 0826   05/13/20 0500  Reticulocytes  Tomorrow morning,   R       Question:  Specimen collection method  Answer:  Unit=Unit collect   05/12/20 0826   05/13/20 0500  CBC with Differential/Platelet  Tomorrow morning,   R       Question:  Specimen collection method  Answer:  Unit=Unit collect   05/12/20 0826   05/13/20 0500  Phosphorus  Daily,   R     Question:  Specimen collection method  Answer:  Unit=Unit collect   05/12/20 0826   05/12/20 3810  Basic metabolic panel  Daily,   R     Question:  Specimen collection method  Answer:  Unit=Unit collect   05/11/20 0934  05/10/20 0656  Culture, Urine  Once,   STAT        05/10/20 0656   05/09/20 1906  Urine culture  (Septic presentation on arrival (screening labs, nursing and treatment orders for obvious sepsis))  ONCE - STAT,   STAT        05/09/20 1908            DVT prophylaxis: enoxaparin (LOVENOX) injection 40 mg Start: 05/09/20 2315   Code Status:full Family Communication:  daughter  Rechel over the phone Disposition:   Status is: Inpatient   Dispo: The patient is from: SNF              Anticipated d/c is to: SNF              Anticipated d/c date is: TBD                Consultants:   ID  Palliative care  Wound care  Procedures:   None  Antimicrobials:   Anti-infectives (From admission, onward)   Start     Dose/Rate Route Frequency Ordered Stop   05/11/20 2200  ertapenem (INVANZ) 1,000 mg in sodium chloride 0.9 % 100 mL IVPB        1 g 200 mL/hr over 30 Minutes Intravenous Every 24 hours 05/11/20 0959      05/10/20 2100  vancomycin (VANCOREADY) IVPB 1000 mg/200 mL  Status:  Discontinued        1,000 mg 200 mL/hr over 60 Minutes Intravenous Every 24 hours 05/09/20 2040 05/11/20 1124   05/10/20 0800  ceFEPIme (MAXIPIME) 2 g in sodium chloride 0.9 % 100 mL IVPB  Status:  Discontinued        2 g 200 mL/hr over 30 Minutes Intravenous Every 12 hours 05/09/20 2040 05/09/20 2146   05/09/20 2300  meropenem (MERREM) 2 g in sodium chloride 0.9 % 100 mL IVPB        2 g 200 mL/hr over 30 Minutes Intravenous Every 12 hours 05/09/20 2146 05/11/20 1034   05/09/20 2000  vancomycin (VANCOREADY) IVPB 1000 mg/200 mL        1,000 mg 200 mL/hr over 60 Minutes Intravenous  Once 05/09/20 1908 05/09/20 2157   05/09/20 1915  ceFEPIme (MAXIPIME) 2 g in sodium chloride 0.9 % 100 mL IVPB        2 g 200 mL/hr over 30 Minutes Intravenous  Once 05/09/20 1908 05/09/20 1959   05/09/20 1915  metroNIDAZOLE (FLAGYL) IVPB 500 mg        500 mg 100 mL/hr over 60 Minutes Intravenous  Once 05/09/20 1908 05/09/20 2056          Objective: Vitals:   05/11/20 1946 05/11/20 2300 05/12/20 0343 05/12/20 0631  BP: (!) 137/52 120/74 (!) 116/97   Pulse: 64 69 67   Resp: $Remo'16 20 16   'OEMkk$ Temp: 97.7 F (36.5 C) 98 F (36.7 C) 98 F (36.7 C)   TempSrc: Oral Oral Oral   SpO2: 98% 99% 96%   Weight:    62.1 kg  Height:        Intake/Output Summary (Last 24 hours) at 05/12/2020 0827 Last data filed at 05/12/2020 0343 Gross per 24 hour  Intake 953.84 ml  Output 1550 ml  Net -596.16 ml   Filed Weights   05/09/20 1916 05/12/20 0631  Weight: 62.1 kg 62.1 kg    Examination:  General exam: till lethargic, but appear improving compared to yesterday , she is oriented x3  today , able to carry a short conversation then drift back to sleep Respiratory system: Clear to auscultation. Respiratory effort normal. Cardiovascular system: Paced rhythm. + murmur Gastrointestinal system: Abdomen is nondistended, soft and nontender.  Normal  bowel sounds heard. Central nervous system:  Lethargic, aaox3 Extremities:  does move upper extremities, no movement in lower extremities noticed Skin: Sacral decubitus ulcers, right heel ulcers Psychiatry: Lethargic, no agitation.     Data Reviewed: I have personally reviewed following labs and imaging studies  CBC: Recent Labs  Lab 05/09/20 1906 05/10/20 0924 05/11/20 0500 05/12/20 0431  WBC 11.2* 14.7* 11.8* 11.3*  NEUTROABS 9.2*  --  8.1*  --   HGB 9.6* 7.5* 7.0* 6.9*  HCT 29.8* 23.3* 20.8* 20.3*  MCV 94.9 94.3 92.9 93.5  PLT 215 165 160 975    Basic Metabolic Panel: Recent Labs  Lab 05/09/20 1906 05/10/20 0924 05/11/20 0500 05/12/20 0431  NA 137 136 137 133*  K 3.7 3.4* 4.0 3.1*  CL 109 108 108 105  CO2 21* $Remov'22 22 23  'jxWIyD$ GLUCOSE 34* 173* 62* 387*  BUN $Re'14 12 13 11  'uuj$ CREATININE 1.28* 1.12* 1.21* 1.16*  CALCIUM 7.7* 7.1* 7.2* 6.8*  MG  --   --  1.6* 1.8  PHOS  --   --   --  1.8*    GFR: Estimated Creatinine Clearance: 38.6 mL/min (A) (by C-G formula based on SCr of 1.16 mg/dL (H)).  Liver Function Tests: Recent Labs  Lab 05/09/20 1906 05/11/20 0500  AST 43* 31  ALT 24 22  ALKPHOS 168* 127*  BILITOT 0.8 1.0  PROT 5.6* 4.1*  ALBUMIN 1.2* <1.0*    CBG: Recent Labs  Lab 05/12/20 0004 05/12/20 0150 05/12/20 0346 05/12/20 0641 05/12/20 0807  GLUCAP 142* 147* 141* 159* 169*     Recent Results (from the past 240 hour(s))  Blood Culture (routine x 2)     Status: None (Preliminary result)   Collection Time: 05/09/20  7:25 PM   Specimen: BLOOD  Result Value Ref Range Status   Specimen Description BLOOD SITE NOT SPECIFIED  Final   Special Requests   Final    BOTTLES DRAWN AEROBIC AND ANAEROBIC Blood Culture adequate volume   Culture   Final    NO GROWTH 1 DAY Performed at [facility], [place] [address], [place], [place] [address]    Report Status PENDING  Incomplete  Resp Panel by RT-PCR (Flu A&B, Covid) Nasopharyngeal Swab     Status: None    Collection Time: 05/09/20  7:30 PM   Specimen: Nasopharyngeal Swab; Nasopharyngeal(NP) swabs in vial transport medium  Result Value Ref Range Status   SARS Coronavirus 2 by RT PCR NEGATIVE NEGATIVE Final    Comment: (NOTE) SARS-CoV-2 target nucleic acids are NOT DETECTED.  The SARS-CoV-2 RNA is generally detectable in upper respiratory specimens during the acute phase of infection. The lowest concentration of SARS-CoV-2 viral copies this assay can detect is 138 copies/mL. A negative result does not preclude SARS-Cov-2 infection and should not be used as the sole basis for treatment or other patient management decisions. A negative result may occur with  improper specimen collection/handling, submission of specimen other than nasopharyngeal swab, presence of viral mutation(s) within the areas targeted by this assay, and inadequate number of viral copies(<138 copies/mL). A negative result must be combined with clinical observations, patient history, and epidemiological information. The expected result is Negative.  Fact Sheet for Patients:  EntrepreneurPulse.com.au  Fact Sheet for Healthcare  Providers:  IncredibleEmployment.be  This test is no t yet approved or cleared by the [place] and  has been authorized for detection and/or diagnosis of SARS-CoV-2 by FDA under an Emergency Use Authorization (EUA). This EUA will remain  in effect (meaning this test can be used) for the duration of the COVID-19 declaration under Section 564(b)(1) of the Act, 21 U.S.C.section 360bbb-3(b)(1), unless the authorization is terminated  or revoked sooner.       Influenza A by PCR NEGATIVE NEGATIVE Final   Influenza B by PCR NEGATIVE NEGATIVE Final    Comment: (NOTE) The Xpert Xpress SARS-CoV-2/FLU/RSV plus assay is intended as an aid in the diagnosis of influenza from Nasopharyngeal swab specimens and should not be used as a sole basis for treatment.  Nasal washings and aspirates are unacceptable for Xpert Xpress SARS-CoV-2/FLU/RSV testing.  Fact Sheet for Patients: EntrepreneurPulse.com.au  Fact Sheet for Healthcare Providers: IncredibleEmployment.be  This test is not yet approved or cleared by the [place] FDA and has been authorized for detection and/or diagnosis of SARS-CoV-2 by FDA under an Emergency Use Authorization (EUA). This EUA will remain in effect (meaning this test can be used) for the duration of the COVID-19 declaration under Section 564(b)(1) of the Act, 21 U.S.C. section 360bbb-3(b)(1), unless the authorization is terminated or revoked.  Performed at [facility], [place] [address] [place], [place] [address]          Radiology Studies: MR FOOT RIGHT W WO CONTRAST  Addendum Date: 05/11/2020   ADDENDUM REPORT: 05/11/2020 19:41 ADDENDUM: The original report was by Dr. Van Clines. The following addendum is by Dr. Van Clines: Please note that there is also some edema in the abductor hallucis and flexor digitorum brevis muscles which is probably neurogenic and less likely to be due to myositis. Electronically Signed   By: Van Clines M.D.   On: 05/11/2020 19:41   Result Date: 05/11/2020 CLINICAL DATA:  Nonhealing ulcer along the plantar heel. EXAM: MRI OF THE RIGHT HINDFOOT WITHOUT AND WITH CONTRAST TECHNIQUE: Multiplanar, multisequence MR imaging of the ankle was performed before and after the administration of intravenous contrast. CONTRAST:  6 cc Gadavist COMPARISON:  CT of the right foot from 04/03/2020 FINDINGS: TENDONS Peroneal: Mild peroneus longus and brevis tenosynovitis. Posteromedial: Mild tibialis posterior tenosynovitis distally. Anterior: Unremarkable Achilles: Mild distal Achilles tendinopathy. Plantar Fascia: Thickened medial band of the plantar fascia with surrounding edema compatible with plantar fasciitis. LIGAMENTS Lateral: Unremarkable  Medial: Unremarkable CARTILAGE Ankle Joint: Unremarkable Subtalar Joints/Sinus Tarsi: Trace effusion of the posterior subtalar facet. Bones: Beneath the severe ulceration along the posterior heel, there are bands of enhancement within the marrow for example on image 13 of series 15. One atypical feature is the low STIR signal within these bands, which could indicate fibrosis rather than necessarily infection. These bands were not readily appreciable on the CT from 04/03/2020. No overt bony destruction is identified. Please note that today's examination was centered on the hindfoot and does not include the distal metatarsals and toes. Other: Prominent ulceration along the posterior calcaneus. Subcutaneous edema and low-grade enhancement medial and lateral to the ankle potentially reflecting cellulitis. IMPRESSION: IMPRESSION 1. Prominent ulceration along the posterior heel with bands of enhancement within the marrow, but no overt bony destruction. Although suspicious for early osteomyelitis, there are some atypical features such as reduced rather than accentuated STIR signal. 2. Plantar fasciitis. 3. Mild tenosynovitis of the peroneal tendons and tibialis posterior. 4. Mild distal Achilles tendinopathy. 5.  Trace effusion of the posterior subtalar facet. 6. Subcutaneous edema and low-grade enhancement medial and lateral to the ankle potentially reflecting cellulitis. 7. Please note that today's examination was centered on the hindfoot and does not include the distal metatarsals and toes. Electronically Signed: By: Van Clines M.D. On: 05/11/2020 18:35   DG Abd Portable 1V  Result Date: 05/11/2020 CLINICAL DATA:  Status post feeding tube placement. EXAM: PORTABLE ABDOMEN - 1 VIEW COMPARISON:  None. FINDINGS: Feeding tube is in place with the tip at the ligament of Treitz. IMPRESSION: Feeding tube in good position. Electronically Signed   By: Inge Rise M.D.   On: 05/11/2020 13:22   VAS [place] UPPER  EXTREMITY VENOUS DUPLEX  Result Date: 05/11/2020 UPPER VENOUS STUDY  Indications: Swelling Risk Factors: None identified. Anticoagulation: Lovenox. Limitations: Patient cooperation and positioning. Comparison Study: No previous Upper venous duplex Performing Technologist: Vonzell Schlatter RVT  Examination Guidelines: A complete evaluation includes B-mode imaging, spectral Doppler, color Doppler, and power Doppler as needed of all accessible portions of each vessel. Bilateral testing is considered an integral part of a complete examination. Limited examinations for reoccurring indications may be performed as noted.  Left Findings: +----------+------------+---------+-----------+----------+--------------+ LEFT      CompressiblePhasicitySpontaneousProperties   Summary     +----------+------------+---------+-----------+----------+--------------+ IJV           Full       Yes       Yes                             +----------+------------+---------+-----------+----------+--------------+ Subclavian               Yes       Yes                             +----------+------------+---------+-----------+----------+--------------+ Axillary                 Yes       Yes                             +----------+------------+---------+-----------+----------+--------------+ Brachial      Full       Yes       Yes                             +----------+------------+---------+-----------+----------+--------------+ Radial        Full                                                 +----------+------------+---------+-----------+----------+--------------+ Ulnar                                               Not visualized +----------+------------+---------+-----------+----------+--------------+ Cephalic      Full                                                 +----------+------------+---------+-----------+----------+--------------+ Basilic  Not visualized +----------+------------+---------+-----------+----------+--------------+  Summary:  Right: However, unable to visualize the Unable to see Ulnar or Basilic due to patient cooperation for positioning. Unable to follow directions.  Left: No evidence of deep vein thrombosis in the upper extremity. No evidence of superficial vein thrombosis in the upper extremity.  *See table(s) above for measurements and observations.    Preliminary         Scheduled Meds: . Chlorhexidine Gluconate Cloth  6 each Topical Daily  . collagenase   Topical Daily  . enoxaparin (LOVENOX) injection  40 mg Subcutaneous QHS  . feeding supplement (PROSource TF)  45 mL Per Tube TID  . potassium & sodium phosphates  1 packet Per J Tube TID  . potassium chloride  40 mEq Per Tube Q4H   Continuous Infusions: . ertapenem 1,000 mg (05/11/20 2218)  . feeding supplement (OSMOLITE 1.5 CAL) 1,000 mL (05/11/20 1748)  . magnesium sulfate bolus IVPB       LOS: 3 days   Time spent: 94mins Greater than 50% of this time was spent in counseling, explanation of diagnosis, planning of further management, and coordination of care.   Voice Recognition Viviann Spare dictation system was used to create this note, attempts have been made to correct errors. Please contact the author with questions and/or clarifications.   Florencia Reasons, MD PhD FACP [facility]  Available via Epic secure chat 7am-7pm for nonurgent issues Please page for urgent issues To page the attending provider between 7A-7P or the covering provider during after hours 7P-7A, please log into the web site [email or web address] and access using universal [place] password for that web site. If you do not have the password, please call the hospital operator.    05/12/2020, 8:27 AM

## 2020-05-13 ENCOUNTER — Inpatient Hospital Stay (HOSPITAL_COMMUNITY): Payer: Medicare Other

## 2020-05-13 DIAGNOSIS — T83511A Infection and inflammatory reaction due to indwelling urethral catheter, initial encounter: Secondary | ICD-10-CM | POA: Diagnosis not present

## 2020-05-13 DIAGNOSIS — G9340 Encephalopathy, unspecified: Secondary | ICD-10-CM | POA: Diagnosis not present

## 2020-05-13 DIAGNOSIS — I130 Hypertensive heart and chronic kidney disease with heart failure and stage 1 through stage 4 chronic kidney disease, or unspecified chronic kidney disease: Secondary | ICD-10-CM | POA: Diagnosis not present

## 2020-05-13 DIAGNOSIS — R601 Generalized edema: Secondary | ICD-10-CM | POA: Diagnosis not present

## 2020-05-13 LAB — BLOOD GAS, ARTERIAL
Acid-base deficit: 0.6 mmol/L (ref 0.0–2.0)
Bicarbonate: 23.1 mmol/L (ref 20.0–28.0)
Drawn by: 533271
FIO2: 21
O2 Saturation: 96.7 %
Patient temperature: 37
pCO2 arterial: 34.6 mmHg (ref 32.0–48.0)
pH, Arterial: 7.439 (ref 7.350–7.450)
pO2, Arterial: 79.1 mmHg — ABNORMAL LOW (ref 83.0–108.0)

## 2020-05-13 LAB — CBC WITH DIFFERENTIAL/PLATELET
Abs Immature Granulocytes: 0.11 10*3/uL — ABNORMAL HIGH (ref 0.00–0.07)
Basophils Absolute: 0.1 10*3/uL (ref 0.0–0.1)
Basophils Relative: 0 %
Eosinophils Absolute: 0.3 10*3/uL (ref 0.0–0.5)
Eosinophils Relative: 2 %
HCT: 22 % — ABNORMAL LOW (ref 36.0–46.0)
Hemoglobin: 7.4 g/dL — ABNORMAL LOW (ref 12.0–15.0)
Immature Granulocytes: 1 %
Lymphocytes Relative: 12 %
Lymphs Abs: 1.8 10*3/uL (ref 0.7–4.0)
MCH: 31.2 pg (ref 26.0–34.0)
MCHC: 33.6 g/dL (ref 30.0–36.0)
MCV: 92.8 fL (ref 80.0–100.0)
Monocytes Absolute: 1.2 10*3/uL — ABNORMAL HIGH (ref 0.1–1.0)
Monocytes Relative: 8 %
Neutro Abs: 11.5 10*3/uL — ABNORMAL HIGH (ref 1.7–7.7)
Neutrophils Relative %: 77 %
Platelets: 162 10*3/uL (ref 150–400)
RBC: 2.37 MIL/uL — ABNORMAL LOW (ref 3.87–5.11)
RDW: 17.4 % — ABNORMAL HIGH (ref 11.5–15.5)
WBC: 14.9 10*3/uL — ABNORMAL HIGH (ref 4.0–10.5)
nRBC: 0 % (ref 0.0–0.2)

## 2020-05-13 LAB — RETICULOCYTES
Immature Retic Fract: 19.2 % — ABNORMAL HIGH (ref 2.3–15.9)
RBC.: 2.36 MIL/uL — ABNORMAL LOW (ref 3.87–5.11)
Retic Count, Absolute: 52.6 10*3/uL (ref 19.0–186.0)
Retic Ct Pct: 2.2 % (ref 0.4–3.1)

## 2020-05-13 LAB — BASIC METABOLIC PANEL
Anion gap: 4 — ABNORMAL LOW (ref 5–15)
BUN: 16 mg/dL (ref 8–23)
CO2: 24 mmol/L (ref 22–32)
Calcium: 7 mg/dL — ABNORMAL LOW (ref 8.9–10.3)
Chloride: 110 mmol/L (ref 98–111)
Creatinine, Ser: 1.19 mg/dL — ABNORMAL HIGH (ref 0.44–1.00)
GFR, Estimated: 47 mL/min — ABNORMAL LOW (ref >60)
Glucose, Bld: 209 mg/dL — ABNORMAL HIGH (ref 70–99)
Potassium: 5 mmol/L (ref 3.5–5.1)
Sodium: 138 mmol/L (ref 135–145)

## 2020-05-13 LAB — GLUCOSE, CAPILLARY
Glucose-Capillary: 165 mg/dL — ABNORMAL HIGH (ref 70–99)
Glucose-Capillary: 165 mg/dL — ABNORMAL HIGH (ref 70–99)
Glucose-Capillary: 168 mg/dL — ABNORMAL HIGH (ref 70–99)
Glucose-Capillary: 173 mg/dL — ABNORMAL HIGH (ref 70–99)
Glucose-Capillary: 189 mg/dL — ABNORMAL HIGH (ref 70–99)
Glucose-Capillary: 191 mg/dL — ABNORMAL HIGH (ref 70–99)

## 2020-05-13 LAB — VITAMIN B12: Vitamin B-12: 737 pg/mL (ref 180–914)

## 2020-05-13 LAB — PHOSPHORUS
Phosphorus: 1.3 mg/dL — ABNORMAL LOW (ref 2.5–4.6)
Phosphorus: 4.4 mg/dL (ref 2.5–4.6)

## 2020-05-13 LAB — FOLATE: Folate: 4.5 ng/mL — ABNORMAL LOW (ref >5.9)

## 2020-05-13 LAB — LACTIC ACID, PLASMA
Lactic Acid, Venous: 1.9 mmol/L (ref 0.5–1.9)
Lactic Acid, Venous: 1.2 mmol/L (ref 0.5–1.9)

## 2020-05-13 MED ORDER — SODIUM PHOSPHATES 45 MMOLE/15ML IV SOLN
45.0000 mmol | Freq: Once | INTRAVENOUS | Status: AC
  Administered 2020-05-13: 45 mmol via INTRAVENOUS
  Filled 2020-05-13: qty 15

## 2020-05-13 MED ORDER — VANCOMYCIN HCL 1250 MG/250ML IV SOLN
1250.0000 mg | Freq: Once | INTRAVENOUS | Status: AC
  Administered 2020-05-13: 1250 mg via INTRAVENOUS
  Filled 2020-05-13: qty 250

## 2020-05-13 MED ORDER — VANCOMYCIN HCL 750 MG/150ML IV SOLN
750.0000 mg | INTRAVENOUS | Status: DC
  Administered 2020-05-14: 750 mg via INTRAVENOUS
  Filled 2020-05-13 (×2): qty 150

## 2020-05-13 MED ORDER — FOLIC ACID 1 MG PO TABS
1.0000 mg | ORAL_TABLET | Freq: Every day | ORAL | Status: DC
  Administered 2020-05-13 – 2020-05-17 (×5): 1 mg
  Filled 2020-05-13 (×4): qty 1

## 2020-05-13 MED ORDER — FLUCONAZOLE 100 MG PO TABS
100.0000 mg | ORAL_TABLET | Freq: Every day | ORAL | Status: DC
  Administered 2020-05-13: 100 mg
  Filled 2020-05-13: qty 1

## 2020-05-13 MED ORDER — ATORVASTATIN CALCIUM 10 MG PO TABS
10.0000 mg | ORAL_TABLET | Freq: Every day | ORAL | Status: DC
  Administered 2020-05-13 – 2020-05-16 (×4): 10 mg
  Filled 2020-05-13 (×4): qty 1

## 2020-05-13 NOTE — Progress Notes (Signed)
Pt continues to have low grade fever. Daughter is requesting tylenol. Tylenol given. Educated daughter on pt condition. She has questions and would like to speak to MD. Reached out to MD to let her know. Pt is in no distress at this time.

## 2020-05-13 NOTE — Progress Notes (Addendum)
PROGRESS NOTE    Audrey FILLA  [name]:[id number] DOB: Dec 01, 1945 DOA: 05/09/2020 PCP: Hendricks Limes, MD    Chief Complaint  Patient presents with  . Altered Mental Status  . Hypoglycemia    Brief Narrative:  [name] is a 75 y.o. female with medical history significant of breast CA in 1990's  s/p mastectomy, chemotherapy,  CVA, AS s/p AVR in 2006, then TAVR in 2021, HFpEF + CHB + PPM (placed 01/2019)-bacteremia group B strep 06/2019 status post PPM explantation and then subsequent Medtronic Micra, h/o    right lower extremity ischemia status post angiography in 12/2019, h/o insulin dependent diabetes , Pt admitted to [place] 2/8 - 2/16 with critical ischemia of R leg and open ulcer of heel growing Morganella morganii on culture.  Other issues included Sacral decubitus ulcer, and urinary retention.Patient was discharged on Invanz through March 25th   FTT has been at nursing home for about a year, family noticed progressive decline for about 31-month, with poor oral intake, weight loss of 30 to 40 pounds, has been bedbound, needing to be fed,  She was brought to the ED from nursing home due to altered mental status, found to have hypoglycemia, blood glucose 29, hypothermia and hypotension  Subjective:  She is more lethargic than yesterday, she knows she is in the hospital, states it is [place] but able to correct to 2022, she does not know the month she spiked fever 100.7  this am   she denies pain  Daughter [name] at bedside  Assessment & Plan:   Principal Problem:   Toxic metabolic encephalopathy Active Problems:   DM2 (diabetes mellitus, type 2) ([place])   Hypertension   Hypoglycemia   Benign hypertensive heart and kidney disease with diastolic CHF, NYHA class 3 and CKD stage 3 ([place])   Sacral decubitus ulcer, stage IV ([place])   Sepsis ([place])   Catheter-associated urinary tract infection ([place])   Hypothermia   Decubitus ulcer of right heel, stage 3 ([place])   Anasarca    Protein-calorie malnutrition, severe  Acute metabolic encephalopathy, present on admission -She was very lethargic not oriented to time on presentation, this has improved on 3/19, however spiked fever on 3/20, she is more lethargic and confused on 3/20 -get abg  Possible sepsis, present with hypothermia , hypotension , leukocytosis , lactic acidosis with multiple possible sources of infection including UTI, sacral decubitus ulcer and heel ulcer, she also has indwelling tunneled line -She has been on Invanz prior to admission, make culture result not reliable  -So far blood culture no growth , urine culture + 8000 yeast -Indwelling Foley changed out in the ED, urine with sediment, no visible blood or pus in urine -She is started on broad-spectrum antibiotics vancomycin and meropenem initially with normalization of Lactic acid improvement of leukocytosis , hypotension and hypothermia -ID consulted input appreciated, Vanco and meropenem discontinued, she is back on Invanz, may need to pull tunneled line if rule out overt infection,  -she spiked fever on 3/20 with worsening wbc and worsening mental status, restarted her back on vanc, add diflucan, repeat blood culture, lactic acid, get cxr -discussed with ID weekend coverage Dr Gale Journey states sacral wound does not appear grossly infected on visual inspection, could consider CT imaging to rule out deep sacral infection, plan to discuss with ID weekday consultant on monday  Hypoglycemia with history of insulin-dependent type 2 diabetes -She is found to have a blood glucose of 29 , EMS called , she was  brought from nursing home to [facility] -initially on D10 at 150cc/hr then 50 cc/h -Restarted on tube feeds, blood glucose start to rise, DC D10 infusion -Continue tube feeds, monitor blood glucose, may need to switch to Glucerna from Osmolite if blood glucose start to rise -Insulin held on admission, started on sliding scale (use very sensitive scale)     History of hypertension, present with hypotension Blood pressure improved and normalized, home blood pressure Lopressor held on presentation, continue hold today  Hypothermia, on presentation Body temperature improved with Bair hugger Now off bair hugger  Normal body temperature on 3/19, started to have fever on 3 /20     CKD 3 a/anemia of chronic disease Monitor renal function, renal dosing medication Pt is a Jehovah's witness.  PVD right lower extremity ischemia status post angiography in 12/2019 Aspirin/Plavix/statin held initially due to not taking oral meds from altered mental status Now meds per tube  H/o HLD Repeat lipid panel LDL is only 9, triglyceride is 69,  I have reviewed her past record, LDL has been overall trending down, as well as total cholesterol, triglyceride and VLDL This is likely due to malnutrition  statin dose decreased  H/o CVA,  H/o AS s/p AVR in 2006, then TAVR in 2021,  H/o HFpEF + CHB + PPM  H/o bacteremia group B strep 06/2019 status post PPM explantation and then subsequent Medtronic Micra placement On Aspirin/Plavix/statin   H/o  breast CA in 1990's  s/p left mastectomy, chemotherapy  FTT/malnutrition/weight loss of 40 pounds in the last 32-month, has been bedbound for about 4 months per daughter Albumin 1.2-<1.0 Poor prognosis Nutrition consult,  palliative care consult  Hyponatremia DC D10  Hypokalemia/hypophosphatemia/hypomagnesemia From poor oral intake  Persistent hypophosphatemia, likely component of refeeding syndrome  Continue to Replace    Body mass index is 22.1 kg/m.Marland Kitchen   Skin Assessment: I have examined the patient's skin and I agree with the wound assessment as performed by the wound care RN as outlined below: Pressure Injury 05/10/20 Sacrum Stage 4 - Full thickness tissue loss with exposed bone, tendon or muscle. (Active)  05/10/20   Location: Sacrum  Location Orientation:   Staging: Stage 4 - Full thickness  tissue loss with exposed bone, tendon or muscle.  Wound Description (Comments):   Present on Admission: Yes     Pressure Injury 05/10/20 Heel Right Stage 4 - Full thickness tissue loss with exposed bone, tendon or muscle. (Active)  05/10/20   Location: Heel  Location Orientation: Right  Staging: Stage 4 - Full thickness tissue loss with exposed bone, tendon or muscle.  Wound Description (Comments):   Present on Admission: Yes    Unresulted Labs (From admission, onward)          Start     Ordered   05/14/20 0500  CBC with Differential/Platelet  Daily,   R     Question:  Specimen collection method  Answer:  Unit=Unit collect   05/13/20 1309   05/14/20 0500  Comprehensive metabolic panel  Tomorrow morning,   R       Question:  Specimen collection method  Answer:  Unit=Unit collect   05/13/20 1309   05/14/20 0500  Magnesium  Tomorrow morning,   R       Question:  Specimen collection method  Answer:  Unit=Unit collect   05/13/20 1309   05/13/20 2000  phosphorus level  (Serum Phosphorus)  Once-Timed,   TIMED       Question:  Specimen collection method  Answer:  Unit=Unit collect   05/13/20 1215   05/13/20 1251  Lactic acid, plasma  STAT Now then every 3 hours,   R     Question:  Specimen collection method  Answer:  Unit=Unit collect   05/13/20 1251   05/13/20 1250  Blood gas, arterial  ONCE - STAT,   STAT       Question:  Room air or oxygen  Answer:  Oxygen   05/13/20 1250   05/13/20 1249  Culture, blood (routine x 2)  BLOOD CULTURE X 2,   R      05/13/20 1248   05/13/20 0500  Phosphorus  Daily,   R     Question:  Specimen collection method  Answer:  Unit=Unit collect   05/12/20 0826   05/12/20 8182  Basic metabolic panel  Daily,   R     Question:  Specimen collection method  Answer:  Unit=Unit collect   05/11/20 0934   05/10/20 0656  Culture, Urine  Once,   STAT        05/10/20 0656            DVT prophylaxis: enoxaparin (LOVENOX) injection 40 mg Start: 05/09/20  2315   Code Status:full Family Communication:  daughter kelly who works at [employer] at bedside  on 317, daughter Rechel over the phone on 317 and 319, daughter [name] at bedside on 320 Disposition:   Status is: Inpatient   Dispo: The patient is from: SNF              Anticipated d/c is to: SNF              Anticipated d/c date is: TBD                Consultants:   ID  Palliative care  Wound care  Procedures:   None  Antimicrobials:   Anti-infectives (From admission, onward)   Start     Dose/Rate Route Frequency Ordered Stop   05/13/20 1345  fluconazole (DIFLUCAN) tablet 100 mg        100 mg Per Tube Daily 05/13/20 1251     05/13/20 1315  vancomycin (VANCOREADY) IVPB 1250 mg/250 mL        1,250 mg 166.7 mL/hr over 90 Minutes Intravenous  Once 05/13/20 1233     05/11/20 2200  ertapenem (INVANZ) 1,000 mg in sodium chloride 0.9 % 100 mL IVPB        1 g 200 mL/hr over 30 Minutes Intravenous Every 24 hours 05/11/20 0959     05/10/20 2100  vancomycin (VANCOREADY) IVPB 1000 mg/200 mL  Status:  Discontinued        1,000 mg 200 mL/hr over 60 Minutes Intravenous Every 24 hours 05/09/20 2040 05/11/20 1124   05/10/20 0800  ceFEPIme (MAXIPIME) 2 g in sodium chloride 0.9 % 100 mL IVPB  Status:  Discontinued        2 g 200 mL/hr over 30 Minutes Intravenous Every 12 hours 05/09/20 2040 05/09/20 2146   05/09/20 2300  meropenem (MERREM) 2 g in sodium chloride 0.9 % 100 mL IVPB        2 g 200 mL/hr over 30 Minutes Intravenous Every 12 hours 05/09/20 2146 05/11/20 1034   05/09/20 2000  vancomycin (VANCOREADY) IVPB 1000 mg/200 mL        1,000 mg 200 mL/hr over 60 Minutes Intravenous  Once 05/09/20 1908 05/09/20 2157   05/09/20 1915  ceFEPIme (  MAXIPIME) 2 g in sodium chloride 0.9 % 100 mL IVPB        2 g 200 mL/hr over 30 Minutes Intravenous  Once 05/09/20 1908 05/09/20 1959   05/09/20 1915  metroNIDAZOLE (FLAGYL) IVPB 500 mg        500 mg 100 mL/hr over 60 Minutes Intravenous   Once 05/09/20 1908 05/09/20 2056          Objective: Vitals:   05/13/20 0423 05/13/20 0821 05/13/20 1118 05/13/20 1215  BP: (!) 102/49 (!) 109/47 (!) 96/56 (!) 102/46  Pulse: 64 66 61 61  Resp: (!) $RemoveB'21 20 20 17  'HPjHJNLp$ Temp: 99.2 F (37.3 C) (!) 100.7 F (38.2 C) (!) 100.4 F (38 C) 99.5 F (37.5 C)  TempSrc: Oral Oral Oral Oral  SpO2: 100% 100% 99% 100%  Weight:      Height:        Intake/Output Summary (Last 24 hours) at 05/13/2020 1311 Last data filed at 05/13/2020 1200 Gross per 24 hour  Intake 979.33 ml  Output 700 ml  Net 279.33 ml   Filed Weights   05/09/20 1916 05/12/20 0631  Weight: 62.1 kg 62.1 kg    Examination:  General exam: more lethargic than yesterday, she is confused about the time Respiratory system: Clear to auscultation. Respiratory effort normal. Cardiovascular system: Paced rhythm. + murmur Gastrointestinal system: Abdomen is nondistended, soft and nontender.  Normal bowel sounds heard. Central nervous system:  Lethargic, slightly confused Extremities:  does move upper extremities, no movement in lower extremities noticed Skin: Sacral decubitus ulcers, right heel ulcers Psychiatry: Lethargic, no agitation.     Data Reviewed: I have personally reviewed following labs and imaging studies  CBC: Recent Labs  Lab 05/09/20 1906 05/10/20 0924 05/11/20 0500 05/12/20 0431 05/13/20 0058  WBC 11.2* 14.7* 11.8* 11.3* 14.9*  NEUTROABS 9.2*  --  8.1*  --  11.5*  HGB 9.6* 7.5* 7.0* 6.9* 7.4*  HCT 29.8* 23.3* 20.8* 20.3* 22.0*  MCV 94.9 94.3 92.9 93.5 92.8  PLT 215 165 160 152 876    Basic Metabolic Panel: Recent Labs  Lab 05/09/20 1906 05/10/20 0924 05/11/20 0500 05/12/20 0431 05/13/20 0058  NA 137 136 137 133* 138  K 3.7 3.4* 4.0 3.1* 5.0  CL 109 108 108 105 110  CO2 21* $Remov'22 22 23 24  'AobggC$ GLUCOSE 34* 173* 62* 387* 209*  BUN $Re'14 12 13 11 16  'Ucz$ CREATININE 1.28* 1.12* 1.21* 1.16* 1.19*  CALCIUM 7.7* 7.1* 7.2* 6.8* 7.0*  MG  --   --  1.6* 1.8  --    PHOS  --   --   --  1.8* 1.3*    GFR: Estimated Creatinine Clearance: 37.7 mL/min (A) (by C-G formula based on SCr of 1.19 mg/dL (H)).  Liver Function Tests: Recent Labs  Lab 05/09/20 1906 05/11/20 0500  AST 43* 31  ALT 24 22  ALKPHOS 168* 127*  BILITOT 0.8 1.0  PROT 5.6* 4.1*  ALBUMIN 1.2* <1.0*    CBG: Recent Labs  Lab 05/12/20 2037 05/13/20 0028 05/13/20 0421 05/13/20 0845 05/13/20 1123  GLUCAP 161* 173* 189* 165* 165*     Recent Results (from the past 240 hour(s))  Blood Culture (routine x 2)     Status: None (Preliminary result)   Collection Time: 05/09/20  7:25 PM   Specimen: BLOOD  Result Value Ref Range Status   Specimen Description BLOOD SITE NOT SPECIFIED  Final   Special Requests   Final    BOTTLES  DRAWN AEROBIC AND ANAEROBIC Blood Culture adequate volume   Culture   Final    NO GROWTH 2 DAYS Performed at [facility], [place] [address] [place], [place] [address]    Report Status PENDING  Incomplete  Resp Panel by RT-PCR (Flu A&B, Covid) Nasopharyngeal Swab     Status: None   Collection Time: 05/09/20  7:30 PM   Specimen: Nasopharyngeal Swab; Nasopharyngeal(NP) swabs in vial transport medium  Result Value Ref Range Status   SARS Coronavirus 2 by RT PCR NEGATIVE NEGATIVE Final    Comment: (NOTE) SARS-CoV-2 target nucleic acids are NOT DETECTED.  The SARS-CoV-2 RNA is generally detectable in upper respiratory specimens during the acute phase of infection. The lowest concentration of SARS-CoV-2 viral copies this assay can detect is 138 copies/mL. A negative result does not preclude SARS-Cov-2 infection and should not be used as the sole basis for treatment or other patient management decisions. A negative result may occur with  improper specimen collection/handling, submission of specimen other than nasopharyngeal swab, presence of viral mutation(s) within the areas targeted by this assay, and inadequate number of viral copies(<138  copies/mL). A negative result must be combined with clinical observations, patient history, and epidemiological information. The expected result is Negative.  Fact Sheet for Patients:  EntrepreneurPulse.com.au  Fact Sheet for Healthcare Providers:  IncredibleEmployment.be  This test is no t yet approved or cleared by the [place] FDA and  has been authorized for detection and/or diagnosis of SARS-CoV-2 by FDA under an Emergency Use Authorization (EUA). This EUA will remain  in effect (meaning this test can be used) for the duration of the COVID-19 declaration under Section 564(b)(1) of the Act, 21 U.S.C.section 360bbb-3(b)(1), unless the authorization is terminated  or revoked sooner.       Influenza A by PCR NEGATIVE NEGATIVE Final   Influenza B by PCR NEGATIVE NEGATIVE Final    Comment: (NOTE) The Xpert Xpress SARS-CoV-2/FLU/RSV plus assay is intended as an aid in the diagnosis of influenza from Nasopharyngeal swab specimens and should not be used as a sole basis for treatment. Nasal washings and aspirates are unacceptable for Xpert Xpress SARS-CoV-2/FLU/RSV testing.  Fact Sheet for Patients: EntrepreneurPulse.com.au  Fact Sheet for Healthcare Providers: IncredibleEmployment.be  This test is not yet approved or cleared by the [place] FDA and has been authorized for detection and/or diagnosis of SARS-CoV-2 by FDA under an Emergency Use Authorization (EUA). This EUA will remain in effect (meaning this test can be used) for the duration of the COVID-19 declaration under Section 564(b)(1) of the Act, 21 U.S.C. section 360bbb-3(b)(1), unless the authorization is terminated or revoked.  Performed at [facility], [place] [address] [place], [place] [address]   Urine culture     Status: Abnormal   Collection Time: 05/11/20 10:10 AM   Specimen: In/Out Cath Urine  Result Value Ref Range  Status   Specimen Description IN/OUT CATH URINE  Final   Special Requests   Final    NONE Performed at [facility], [place] [address] [place], [place] [address]    Culture 8,000 COLONIES/mL YEAST (A)  Final   Report Status 05/12/2020 FINAL  Final         Radiology Studies: MR FOOT RIGHT W WO CONTRAST  Addendum Date: 05/11/2020   ADDENDUM REPORT: 05/11/2020 19:41 ADDENDUM: The original report was by Dr. Van Clines. The following addendum is by Dr. Van Clines: Please note that there is also some edema in  the abductor hallucis and flexor digitorum brevis muscles which is probably neurogenic and less likely to be due to myositis. Electronically Signed   By: Van Clines M.D.   On: 05/11/2020 19:41   Result Date: 05/11/2020 CLINICAL DATA:  Nonhealing ulcer along the plantar heel. EXAM: MRI OF THE RIGHT HINDFOOT WITHOUT AND WITH CONTRAST TECHNIQUE: Multiplanar, multisequence MR imaging of the ankle was performed before and after the administration of intravenous contrast. CONTRAST:  6 cc Gadavist COMPARISON:  CT of the right foot from 04/03/2020 FINDINGS: TENDONS Peroneal: Mild peroneus longus and brevis tenosynovitis. Posteromedial: Mild tibialis posterior tenosynovitis distally. Anterior: Unremarkable Achilles: Mild distal Achilles tendinopathy. Plantar Fascia: Thickened medial band of the plantar fascia with surrounding edema compatible with plantar fasciitis. LIGAMENTS Lateral: Unremarkable Medial: Unremarkable CARTILAGE Ankle Joint: Unremarkable Subtalar Joints/Sinus Tarsi: Trace effusion of the posterior subtalar facet. Bones: Beneath the severe ulceration along the posterior heel, there are bands of enhancement within the marrow for example on image 13 of series 15. One atypical feature is the low STIR signal within these bands, which could indicate fibrosis rather than necessarily infection. These bands were not readily appreciable on the CT from 04/03/2020. No overt  bony destruction is identified. Please note that today's examination was centered on the hindfoot and does not include the distal metatarsals and toes. Other: Prominent ulceration along the posterior calcaneus. Subcutaneous edema and low-grade enhancement medial and lateral to the ankle potentially reflecting cellulitis. IMPRESSION: IMPRESSION 1. Prominent ulceration along the posterior heel with bands of enhancement within the marrow, but no overt bony destruction. Although suspicious for early osteomyelitis, there are some atypical features such as reduced rather than accentuated STIR signal. 2. Plantar fasciitis. 3. Mild tenosynovitis of the peroneal tendons and tibialis posterior. 4. Mild distal Achilles tendinopathy. 5. Trace effusion of the posterior subtalar facet. 6. Subcutaneous edema and low-grade enhancement medial and lateral to the ankle potentially reflecting cellulitis. 7. Please note that today's examination was centered on the hindfoot and does not include the distal metatarsals and toes. Electronically Signed: By: Van Clines M.D. On: 05/11/2020 18:35        Scheduled Meds: . atorvastatin  10 mg Per Tube QHS  . Chlorhexidine Gluconate Cloth  6 each Topical Daily  . clopidogrel  75 mg Per Tube Q breakfast  . collagenase   Topical Daily  . enoxaparin (LOVENOX) injection  40 mg Subcutaneous QHS  . feeding supplement (PROSource TF)  45 mL Per Tube TID  . fluconazole  100 mg Per Tube Daily  . folic acid  1 mg Per Tube Daily  . insulin aspart  0-6 Units Subcutaneous Q4H  . mirtazapine  15 mg Per Tube QHS   Continuous Infusions: . ertapenem 1,000 mg (05/13/20 0113)  . feeding supplement (OSMOLITE 1.5 CAL) 50 mL/hr at 05/13/20 0700  . sodium phosphate  Dextrose 5% IVPB    . vancomycin       LOS: 4 days   Time spent: 11mins Greater than 50% of this time was spent in counseling, explanation of diagnosis, planning of further management, and coordination of care.   Voice  Recognition Viviann Spare dictation system was used to create this note, attempts have been made to correct errors. Please contact the author with questions and/or clarifications.   Florencia Reasons, MD PhD FACP [facility]  Available via Epic secure chat 7am-7pm for nonurgent issues Please page for urgent issues To page the attending provider between 7A-7P or the covering provider during after hours 7P-7A,  please log into the web site [email or web address] and access using universal [place] password for that web site. If you do not have the password, please call the hospital operator.    05/13/2020, 1:11 PM

## 2020-05-13 NOTE — Progress Notes (Addendum)
Pharmacy Antibiotic Note  [name] is a 75 y.o. female admitted on 05/09/2020 with worsening confusion concerning for infection of unknown source. Of note, patient has a complicated infectious history significant for osteomyelitis and infected decubitus ulcer and continued ertapenem outpatient.   Patient was placed on meropenem and vancomycin on admission which was narrowed to ertapenem monotherapy on 3/18. Repeat foot MRI showed no evidence of ongoing osteo. Now Tmax 100.26F, WBC up to 14.9, and repeat blood cultures were obtained. Pharmacy has been consulted for restarting vancomycin.   Plan: Give IV vancomycin 1,$RemoveBeforeD'250mg'TQeyYxMfdrIfSK$  load x1 Initiate IV vancomycin $RemoveBefor'750mg'SayGtHtFAgxM$  every 24 hours (estimated AUC 470) Continue IV ertapenem 1gm every 24 hours Follow up with cultures, antibiotic de-escalation and LOT Monitor renal function and clinical progress Follow plans for additional soft tissue MRI and broadening antibiotics  Height: $Remove'5\' 6"'OdPJktS$  (167.6 cm) Weight: 62.1 kg (136 lb 14.5 oz) IBW/kg (Calculated) : 59.3  Temp (24hrs), Avg:99.6 F (37.6 C), Min:98.7 F (37.1 C), Max:100.7 F (38.2 C)  Recent Labs  Lab 05/09/20 1906 05/09/20 1925 05/09/20 2132 05/10/20 0924 05/11/20 0500 05/12/20 0431 05/13/20 0058  WBC 11.2*  --   --  14.7* 11.8* 11.3* 14.9*  CREATININE 1.28*  --   --  1.12* 1.21* 1.16* 1.19*  LATICACIDVEN  --  1.9 2.3*  --  1.4  --   --     Estimated Creatinine Clearance: 37.7 mL/min (A) (by C-G formula based on SCr of 1.19 mg/dL (H)).    Allergies  Allergen Reactions  . Doxycycline     Made her stopped eating   . 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," as the patient is a Jehovah's Witness    Antimicrobials this admission: Vancomycin 3/16 >> 3/17, 3/20 >>  Meropenem 3/16 >> 3/18 Ertapenem 3/18 >>  Microbiology results: 3/16 BCx: ngtd  Thank you for allowing pharmacy to be a part of this patient's care.  Mercy Riding, PharmD PGY1 Acute Care Pharmacy  Resident Please refer to [facility] for unit-specific pharmacist

## 2020-05-13 NOTE — Progress Notes (Signed)
Chart check  A/p Fever Chronic sacral decub ulcer Recent calcaneal osteomyelitis on treatment Hx pm infection gbs s/p new pm/tavr Dementia  Patient had another febrile episode 3/20 100.7. she was previously narrowed on her abx spectrum to ertapenem on 3/18 from vanc/meropenem 3/18 repeat mri foot no obvious evidence ongoing osteomyelitis 1. Prominent ulceration along the posterior heel with bands of enhancement within the marrow, but no overt bony destruction. Although suspicious for early osteomyelitis, there are some atypical features such as reduced rather than accentuated STIR signal. 2. Plantar fasciitis. 3. Mild tenosynovitis of the peroneal tendons and tibialis posterior. 4. Mild distal Achilles tendinopathy. 5. Trace effusion of the posterior subtalar facet. 6. Subcutaneous edema and low-grade enhancement medial and lateral to the ankle potentially reflecting cellulitis. 7. Please note that today's examination was centered on the hindfoot and does not include the distal metatarsals and toes.  She remains on room air. Her hemodynamics are stable otherwise  Her wbc is trending up a bit to 15 today 3/20  She has a picc line   We considered non-id associated fever such as drug rash but no chart report rash or eos on esam    -agree adding back vancomycin while waiting for repeat bcx result -continue ertapenem -discussed with team previously, while the sacral ulcer surrounding show no obvious evidence SSI, if persistent fever and no other source (lung, abd abscess, line infection), might have to scan r/o deep soft tissue abscess  -check lft tomorrow to assess for drug toxicity -if hemodynamic compromise, would change ertapenem to meropenem while awaiting culture  -no need for fluconazole; I have stopped

## 2020-05-14 ENCOUNTER — Inpatient Hospital Stay (HOSPITAL_COMMUNITY): Payer: Medicare Other

## 2020-05-14 DIAGNOSIS — L89154 Pressure ulcer of sacral region, stage 4: Secondary | ICD-10-CM | POA: Diagnosis not present

## 2020-05-14 DIAGNOSIS — R509 Fever, unspecified: Secondary | ICD-10-CM

## 2020-05-14 DIAGNOSIS — L89613 Pressure ulcer of right heel, stage 3: Secondary | ICD-10-CM | POA: Diagnosis not present

## 2020-05-14 DIAGNOSIS — E43 Unspecified severe protein-calorie malnutrition: Secondary | ICD-10-CM

## 2020-05-14 DIAGNOSIS — G928 Other toxic encephalopathy: Secondary | ICD-10-CM | POA: Diagnosis not present

## 2020-05-14 DIAGNOSIS — Z7401 Bed confinement status: Secondary | ICD-10-CM

## 2020-05-14 DIAGNOSIS — R627 Adult failure to thrive: Secondary | ICD-10-CM

## 2020-05-14 DIAGNOSIS — E8809 Other disorders of plasma-protein metabolism, not elsewhere classified: Secondary | ICD-10-CM

## 2020-05-14 LAB — COMPREHENSIVE METABOLIC PANEL
ALT: 18 U/L (ref 0–44)
AST: 27 U/L (ref 15–41)
Albumin: <1 g/dL — ABNORMAL LOW (ref 3.5–5.0)
Alkaline Phosphatase: 232 U/L — ABNORMAL HIGH (ref 38–126)
Anion gap: 6 (ref 5–15)
BUN: 22 mg/dL (ref 8–23)
CO2: 24 mmol/L (ref 22–32)
Calcium: 7 mg/dL — ABNORMAL LOW (ref 8.9–10.3)
Chloride: 110 mmol/L (ref 98–111)
Creatinine, Ser: 1.34 mg/dL — ABNORMAL HIGH (ref 0.44–1.00)
GFR, Estimated: 41 mL/min — ABNORMAL LOW (ref >60)
Glucose, Bld: 217 mg/dL — ABNORMAL HIGH (ref 70–99)
Potassium: 4.7 mmol/L (ref 3.5–5.1)
Sodium: 140 mmol/L (ref 135–145)
Total Bilirubin: 0.6 mg/dL (ref 0.3–1.2)
Total Protein: 4.5 g/dL — ABNORMAL LOW (ref 6.5–8.1)

## 2020-05-14 LAB — CBC WITH DIFFERENTIAL/PLATELET
Abs Immature Granulocytes: 0.14 10*3/uL — ABNORMAL HIGH (ref 0.00–0.07)
Basophils Absolute: 0 10*3/uL (ref 0.0–0.1)
Basophils Relative: 0 %
Eosinophils Absolute: 0.4 10*3/uL (ref 0.0–0.5)
Eosinophils Relative: 3 %
HCT: 19.6 % — ABNORMAL LOW (ref 36.0–46.0)
Hemoglobin: 6.4 g/dL — CL (ref 12.0–15.0)
Immature Granulocytes: 1 %
Lymphocytes Relative: 13 %
Lymphs Abs: 2.1 10*3/uL (ref 0.7–4.0)
MCH: 31.1 pg (ref 26.0–34.0)
MCHC: 32.7 g/dL (ref 30.0–36.0)
MCV: 95.1 fL (ref 80.0–100.0)
Monocytes Absolute: 1.4 10*3/uL — ABNORMAL HIGH (ref 0.1–1.0)
Monocytes Relative: 9 %
Neutro Abs: 11.5 10*3/uL — ABNORMAL HIGH (ref 1.7–7.7)
Neutrophils Relative %: 74 %
Platelets: 148 10*3/uL — ABNORMAL LOW (ref 150–400)
RBC: 2.06 MIL/uL — ABNORMAL LOW (ref 3.87–5.11)
RDW: 17.8 % — ABNORMAL HIGH (ref 11.5–15.5)
WBC: 15.6 10*3/uL — ABNORMAL HIGH (ref 4.0–10.5)
nRBC: 0.1 % (ref 0.0–0.2)

## 2020-05-14 LAB — SEDIMENTATION RATE: Sed Rate: 45 mm/hr — ABNORMAL HIGH (ref 0–22)

## 2020-05-14 LAB — IRON AND TIBC: Iron: 43 ug/dL (ref 28–170)

## 2020-05-14 LAB — PHOSPHORUS: Phosphorus: 4.1 mg/dL (ref 2.5–4.6)

## 2020-05-14 LAB — GLUCOSE, CAPILLARY
Glucose-Capillary: 141 mg/dL — ABNORMAL HIGH (ref 70–99)
Glucose-Capillary: 150 mg/dL — ABNORMAL HIGH (ref 70–99)
Glucose-Capillary: 169 mg/dL — ABNORMAL HIGH (ref 70–99)
Glucose-Capillary: 171 mg/dL — ABNORMAL HIGH (ref 70–99)
Glucose-Capillary: 184 mg/dL — ABNORMAL HIGH (ref 70–99)
Glucose-Capillary: 192 mg/dL — ABNORMAL HIGH (ref 70–99)

## 2020-05-14 LAB — FERRITIN: Ferritin: 566 ng/mL — ABNORMAL HIGH (ref 11–307)

## 2020-05-14 LAB — MAGNESIUM: Magnesium: 2 mg/dL (ref 1.7–2.4)

## 2020-05-14 LAB — C-REACTIVE PROTEIN: CRP: 1.3 mg/dL — ABNORMAL HIGH (ref <1.0)

## 2020-05-14 MED ORDER — IPRATROPIUM-ALBUTEROL 0.5-2.5 (3) MG/3ML IN SOLN: 3.0000 mL | Freq: Four times a day (QID) | RESPIRATORY_TRACT | Status: DC | PRN

## 2020-05-14 NOTE — Progress Notes (Signed)
[facility] Hospital Liaison note:  This patient is currently enrolled in [facility] outpatient-based Palliative care. Will continue to follow for disposition.  Please call for any outpatient palliative care questions or concerns.  Thank you, Lorelee Market, LPN [facility] Liaison [phone]

## 2020-05-14 NOTE — Care Management Important Message (Signed)
Important Message  Patient Details  Name: [name] MRN: [id number] Date of Birth: Aug 22, 1945   Medicare Important Message Given:  Yes     Shelda Altes 05/14/2020, 11:27 AM

## 2020-05-14 NOTE — Progress Notes (Addendum)
Subjective: Patient did not have new complaints   Antibiotics:  Anti-infectives (From admission, onward)   Start     Dose/Rate Route Frequency Ordered Stop   05/14/20 1300  vancomycin (VANCOREADY) IVPB 750 mg/150 mL        750 mg 150 mL/hr over 60 Minutes Intravenous Every 24 hours 05/13/20 1415     05/13/20 1345  fluconazole (DIFLUCAN) tablet 100 mg  Status:  Discontinued        100 mg Per Tube Daily 05/13/20 1251 05/13/20 1358   05/13/20 1315  vancomycin (VANCOREADY) IVPB 1250 mg/250 mL        1,250 mg 166.7 mL/hr over 90 Minutes Intravenous  Once 05/13/20 1233 05/13/20 1454   05/11/20 2200  ertapenem (INVANZ) 1,000 mg in sodium chloride 0.9 % 100 mL IVPB        1 g 200 mL/hr over 30 Minutes Intravenous Every 24 hours 05/11/20 0959     05/10/20 2100  vancomycin (VANCOREADY) IVPB 1000 mg/200 mL  Status:  Discontinued        1,000 mg 200 mL/hr over 60 Minutes Intravenous Every 24 hours 05/09/20 2040 05/11/20 1124   05/10/20 0800  ceFEPIme (MAXIPIME) 2 g in sodium chloride 0.9 % 100 mL IVPB  Status:  Discontinued        2 g 200 mL/hr over 30 Minutes Intravenous Every 12 hours 05/09/20 2040 05/09/20 2146   05/09/20 2300  meropenem (MERREM) 2 g in sodium chloride 0.9 % 100 mL IVPB        2 g 200 mL/hr over 30 Minutes Intravenous Every 12 hours 05/09/20 2146 05/11/20 1034   05/09/20 2000  vancomycin (VANCOREADY) IVPB 1000 mg/200 mL        1,000 mg 200 mL/hr over 60 Minutes Intravenous  Once 05/09/20 1908 05/09/20 2157   05/09/20 1915  ceFEPIme (MAXIPIME) 2 g in sodium chloride 0.9 % 100 mL IVPB        2 g 200 mL/hr over 30 Minutes Intravenous  Once 05/09/20 1908 05/09/20 1959   05/09/20 1915  metroNIDAZOLE (FLAGYL) IVPB 500 mg        500 mg 100 mL/hr over 60 Minutes Intravenous  Once 05/09/20 1908 05/09/20 2056      Medications: Scheduled Meds: . atorvastatin  10 mg Per Tube QHS  . Chlorhexidine Gluconate Cloth  6 each Topical Daily  . clopidogrel  75 mg Per Tube  Q breakfast  . collagenase   Topical Daily  . enoxaparin (LOVENOX) injection  40 mg Subcutaneous QHS  . feeding supplement (PROSource TF)  45 mL Per Tube TID  . folic acid  1 mg Per Tube Daily  . insulin aspart  0-6 Units Subcutaneous Q4H  . mirtazapine  15 mg Per Tube QHS   Continuous Infusions: . ertapenem 1,000 mg (05/13/20 2120)  . feeding supplement (OSMOLITE 1.5 CAL) 50 mL/hr at 05/13/20 0700  . vancomycin 750 mg (05/14/20 1326)   PRN Meds:.acetaminophen **OR** acetaminophen, gadobutrol, ondansetron **OR** ondansetron (ZOFRAN) IV, oxyCODONE-acetaminophen    Objective: Weight change:   Intake/Output Summary (Last 24 hours) at 05/14/2020 1539 Last data filed at 05/14/2020 0841 Gross per 24 hour  Intake --  Output 400 ml  Net -400 ml   Blood pressure (!) 103/39, pulse 84, temperature 100.1 F (37.8 C), temperature source Oral, resp. rate 20, height $RemoveBe'5\' 6"'btutEJwHN$  (1.676 m), weight 62.1 kg, SpO2 94 %. Temp:  [98.9 F (37.2 C)-100.1 F (37.8 C)] 100.1  F (37.8 C) (03/21 1300) Pulse Rate:  [59-84] 84 (03/21 1300) Resp:  [16-20] 20 (03/21 1300) BP: (92-126)/(39-54) 103/39 (03/21 1300) SpO2:  [94 %-100 %] 94 % (03/21 1300) Weight:  [62.1 kg] 62.1 kg (03/21 0437)  Physical Exam: Physical Exam Eyes:     Extraocular Movements: Extraocular movements intact.  Cardiovascular:     Rate and Rhythm: Normal rate.     Heart sounds: Murmur heard.  No gallop.   Pulmonary:     Effort: Pulmonary effort is normal. No respiratory distress.     Breath sounds: Normal breath sounds.  Abdominal:     General: Bowel sounds are normal. There is no distension.     Palpations: There is no mass.  Neurological:     Mental Status: She is oriented to person, place, and time.  Psychiatric:        Speech: She is noncommunicative.        Cognition and Memory: Cognition is impaired.     Right foot 3/17:      "" 3/18:    Decubitus ulcer not examined today.  CBC:    BMET Recent Labs     05/13/20 0058 05/14/20 0500  NA 138 140  K 5.0 4.7  CL 110 110  CO2 24 24  GLUCOSE 209* 217*  BUN 16 22  CREATININE 1.19* 1.34*  CALCIUM 7.0* 7.0*     Liver Panel  Recent Labs    05/14/20 0500  PROT 4.5*  ALBUMIN <1.0*  AST 27  ALT 18  ALKPHOS 232*  BILITOT 0.6       Sedimentation Rate Recent Labs    05/14/20 0500  ESRSEDRATE 45*   C-Reactive Protein Recent Labs    05/14/20 0500  CRP 1.3*    Micro Results: Recent Results (from the past 720 hour(s))  Blood Culture (routine x 2)     Status: None (Preliminary result)   Collection Time: 05/09/20  7:25 PM   Specimen: BLOOD  Result Value Ref Range Status   Specimen Description BLOOD SITE NOT SPECIFIED  Final   Special Requests   Final    BOTTLES DRAWN AEROBIC AND ANAEROBIC Blood Culture adequate volume   Culture   Final    NO GROWTH 4 DAYS Performed at [facility], [address], [place], [place] [address]    Report Status PENDING  Incomplete  Resp Panel by RT-PCR (Flu A&B, Covid) Nasopharyngeal Swab     Status: None   Collection Time: 05/09/20  7:30 PM   Specimen: Nasopharyngeal Swab; Nasopharyngeal(NP) swabs in vial transport medium  Result Value Ref Range Status   SARS Coronavirus 2 by RT PCR NEGATIVE NEGATIVE Final    Comment: (NOTE) SARS-CoV-2 target nucleic acids are NOT DETECTED.  The SARS-CoV-2 RNA is generally detectable in upper respiratory specimens during the acute phase of infection. The lowest concentration of SARS-CoV-2 viral copies this assay can detect is 138 copies/mL. A negative result does not preclude SARS-Cov-2 infection and should not be used as the sole basis for treatment or other patient management decisions. A negative result may occur with  improper specimen collection/handling, submission of specimen other than nasopharyngeal swab, presence of viral mutation(s) within the areas targeted by this assay, and inadequate number of viral copies(<138 copies/mL). A  negative result must be combined with clinical observations, patient history, and epidemiological information. The expected result is Negative.  Fact Sheet for Patients:  EntrepreneurPulse.com.au  Fact Sheet for Healthcare Providers:  IncredibleEmployment.be  This test  is no t yet approved or cleared by the [place] and  has been authorized for detection and/or diagnosis of SARS-CoV-2 by FDA under an Emergency Use Authorization (EUA). This EUA will remain  in effect (meaning this test can be used) for the duration of the COVID-19 declaration under Section 564(b)(1) of the Act, 21 U.S.C.section 360bbb-3(b)(1), unless the authorization is terminated  or revoked sooner.       Influenza A by PCR NEGATIVE NEGATIVE Final   Influenza B by PCR NEGATIVE NEGATIVE Final    Comment: (NOTE) The Xpert Xpress SARS-CoV-2/FLU/RSV plus assay is intended as an aid in the diagnosis of influenza from Nasopharyngeal swab specimens and should not be used as a sole basis for treatment. Nasal washings and aspirates are unacceptable for Xpert Xpress SARS-CoV-2/FLU/RSV testing.  Fact Sheet for Patients: EntrepreneurPulse.com.au  Fact Sheet for Healthcare Providers: IncredibleEmployment.be  This test is not yet approved or cleared by the [place] FDA and has been authorized for detection and/or diagnosis of SARS-CoV-2 by FDA under an Emergency Use Authorization (EUA). This EUA will remain in effect (meaning this test can be used) for the duration of the COVID-19 declaration under Section 564(b)(1) of the Act, 21 U.S.C. section 360bbb-3(b)(1), unless the authorization is terminated or revoked.  Performed at [facility], [place] [address], [place], [place] [address]   Urine culture     Status: Abnormal   Collection Time: 05/11/20 10:10 AM   Specimen: In/Out Cath Urine  Result Value Ref Range Status   Specimen  Description IN/OUT CATH URINE  Final   Special Requests   Final    NONE Performed at [facility], [place] [address] [place], [place] [address]    Culture 8,000 COLONIES/mL YEAST (A)  Final   Report Status 05/12/2020 FINAL  Final  Culture, blood (routine x 2)     Status: None (Preliminary result)   Collection Time: 05/13/20  1:12 PM   Specimen: BLOOD  Result Value Ref Range Status   Specimen Description BLOOD RIGHT ANTECUBITAL  Final   Special Requests   Final    BOTTLES DRAWN AEROBIC ONLY Blood Culture results may not be optimal due to an inadequate volume of blood received in culture bottles   Culture   Final    NO GROWTH < 24 HOURS Performed at [facility], [place] [address], [place], [place] [address]    Report Status PENDING  Incomplete  Culture, blood (routine x 2)     Status: None (Preliminary result)   Collection Time: 05/13/20  1:29 PM   Specimen: BLOOD RIGHT HAND  Result Value Ref Range Status   Specimen Description BLOOD RIGHT HAND  Final   Special Requests   Final    BOTTLES DRAWN AEROBIC ONLY Blood Culture results may not be optimal due to an inadequate volume of blood received in culture bottles   Culture   Final    NO GROWTH < 24 HOURS Performed at [facility], [place] [address] [place], [place] [address]    Report Status PENDING  Incomplete    Studies/Results: DG CHEST PORT 1 VIEW  Result Date: 05/13/2020 CLINICAL DATA:  Fever EXAM: PORTABLE CHEST 1 VIEW COMPARISON:  05/09/2020 FINDINGS: Prior TAVR and CABG. Heart is normal size. Tortuous ectatic thoracic aorta. No confluent airspace opacities or effusions. Right internal jugular central line is unchanged. Feeding tube is seen entering the stomach and projects off the lower aspect of the film. IMPRESSION:  Tortuous, ectatic aorta. No acute cardiopulmonary disease. Electronically Signed   By: Rolm Baptise M.D.   On: 05/13/2020 15:35      Assessment/Plan:  INTERVAL HISTORY: Low-grade fevers over  the weekend  Principal Problem:   Toxic metabolic encephalopathy Active Problems:   DM2 (diabetes mellitus, type 2) (HCC)   Hypertension   Hypoglycemia   Benign hypertensive heart and kidney disease with diastolic CHF, NYHA class 3 and CKD stage 3 ([place])   Sacral decubitus ulcer, stage IV (HCC)   Sepsis (HCC)   Catheter-associated urinary tract infection ([place])   Hypothermia   Decubitus ulcer of right heel, stage 3 ([place])   Anasarca   Protein-calorie malnutrition, severe    CALLA WEDEKIND is a 75 y.o. female with multiple medical problems and dementia with bedbound status and large sacral decubitus ulcer, history of pacemaker the became infected with group B streptococcus in the context of group B streptococcus bacteremia status post extraction of the pacemaker in 2021 with placement of new Medtronic Micra.  Patient also underwent a TAVR.  (Note I have discussed her case via secure chat with both Dr. Curt Bears as well as the patient's cardiology PA Lattie Corns and they have told me that both the pacemaker and the T AVR are MRI safe)  More recently she was admitted to the hospital in February worsening of her sacral wound along with a diabetic foot ulcer with evidence of osteomyelitis on imaging.  She underwent surgery at the podiatry who removed a piece of bone from her calcaneus and sent this to the lab in the path lab no evidence of osteomyelitis was seen under the microscope.  No cultures were done at that time.  She did have a prior culture from her wound that grew multiple organisms including Morganella.  Dr. Juleen China recommended a course of ertapenem which the patient was continuing on.  She was seen by Janene Madeira 2 days ago in clinic and appeared to be doing well.  Her inflammatories had been trending.  In the interim however she experienced increasing confusion hypothermia and hypotension in the setting context of hypoglycemia.  She was readmitted to the hospital  blood cultures were taken urine cultures were taken and she was placed on broad-spectrum antibiotics with vancomycin and cefepime and then vancomycin and meropenem.  Attained an MRI of the foot last week which was reassuring and then narrowed her back to ertapenem.  Over the weekend she had a low-grade fever and blood cultures were collected again and vancomycin reinstituted.  For what is aware it is worth her white cell are trending upward again along with her inflammatory markers.  For thoroughness I will get an MRI of her lumbosacral area with contrast.  Her pacemaker will need to be reprogrammed again afterwards.   We did identify something that merits long-term systemic antibiotics would discontinue her central line.  Failure to thrive: I do not see her improving long-term with her poor nutritional status and in bedbound.  She will likely have recurrent hospitalizations in the future unfortunately.    LOS: 5 days   Alcide Evener 05/14/2020, 3:39 PM

## 2020-05-14 NOTE — Progress Notes (Signed)
PROGRESS NOTE    [name]  QJJ:[id number] DOB: August 17, 1945 DOA: 05/09/2020 PCP: Hendricks Limes, MD   Chief Complain: Altered mental,hypoglycemia  Brief Narrative: [name] a 75 y.o.femalewith medical history significant of breast CA in 1990's  s/p mastectomy, chemotherapy, CVA, AS s/p AVR in 2006, then TAVR in 2021, HFpEF + CHB + PPM (placed 01/2019)-bacteremia group B strep 06/2019 status post PPM explantation and then subsequent Medtronic Micra, h/o right lower extremity ischemia status post angiography in 12/2019, h/o insulin dependent diabetes .She was admitted from  2/8 - 2/16 with critical ischemia of R leg and open ulcer of heel growing [name] on culture. Other issues included Sacral decubitus ulcer, and urinary retention.Patient was discharged on Invanz through March 25th She has FTT has been at nursing home for about a year, family noticed progressive decline for about 17-month, with poor oral intake, weight loss of 30 to 40 pounds, has been bedbound, needing to be fed. She was brought to the ED from nursing home this time  due to altered mental status, found to have hypoglycemia, blood glucose 29, hypothermia and hypotension.  She was admitted for further management of acute metabolic encephalopathy possible sepsis from sacral decubitus ulcer/heel ulcer.  Currently on broad-spectrum antibiotics.  ID following.  Palliative care  also consulted for goals of care.  Remains full code  Assessment & Plan:   Principal Problem:   Toxic metabolic encephalopathy Active Problems:   DM2 (diabetes mellitus, type 2) (HCC)   Hypertension   Hypoglycemia   Benign hypertensive heart and kidney disease with diastolic CHF, NYHA class 3 and CKD stage 3 (HCC)   Sacral decubitus ulcer, stage IV (HCC)   Sepsis (HCC)   Catheter-associated urinary tract infection ([place])   Hypothermia   Decubitus ulcer of right heel, stage 3 (HCC)   Anasarca   Protein-calorie  malnutrition, severe   Acute metabolic encephalopathy: Present on admission.  ABG done 05/13/2020 did not show any hypercarbia.  Monitor mental status.  Will get CT of the head.  Suspected sepsis: Presented with hypothermia, hypotension, leukocytosis, lactic acidosis.  Source of infection could be sacral decubitus ulcer/heel ulcer.  Right foot MRI showed  prominent ulceration along the posterior heel with bands of enhancement within the marrow,no overt bony destruction,suspicious for early osteomyelitis,subcutaneous edema and low-grade enhancement medial and lateral to the ankle potentially reflecting cellulitis. Indwelling tunneled catheter looks clean.  Blood cultures have not shown any growth.  Urine culture showed yeast.  Indwelling Foley catheter change in the emergency department.  She was started on broad-spectrum antibiotics.  ID consulted and following.  Currently on Invanz, vancomycin. Suspect fever on 3/20 with worsening WBC count worsening mental status.  Chest x-ray denies any pneumonia.  Today she has very mild grade fever.  She continues to spike fever, we might need to get CT imaging to rule out deep sacral  infection of the soft tissues.  Acute on chronic normocytic anemia/thrombocytopenia: Hemoglobin dropped to 6.4 today.  Patient is Jehovah's Witness.  Continue to monitor hemoglobin.  No signs of active bleeding. Will check iron studies.  Hypoglycemia/history of type 2 diabetes: On presentation, she was hypoglycemic with blood sugar of 29.  She was initially given D10 infusion.  Also started on tube feeds.  Currently on sliding scale.  Blood sugar stable  History of hypertension: Presented with hypotension.  Blood pressure has normalized.  Monitor.  Antihypertensives on hold.  Hypothermia: Resolved.  Now has mild grade fever  CKD  stage IIIa: Currently kidney function at baseline.  Monitor.  Avoid nephrotoxins.  History of peripheral vascular disease: She has history of right  lower extremity ischemia status post angiography on 11/21.  On aspirin, Plavix, statin, being given by tube  History of hyperlipidemia: On atorvastatin.  History of CVA:H/o AS s/p AVR in 2006, then TAVR in 2021,  H/o HFpEF + CHB + PPM  H/o bacteremia group B strep 06/2019 status post PPM explantation and then subsequent Medtronic Micra placement On Aspirin/Plavix/statin   History of breast cancer: Status post left mastectomy, chemotherapy.  Now in remission.  Severe protein calorie malnutrition/poor oral intake: Due to immobility, bedbound status.  History of 40 pound weight loss in the last 4 months.  Albumin less than 1.  Nutrition following.  Currently on tube feeding diet.  Severe electrolyte abnormalities: Currently stable.  Most likely from poor oral intake.  Continue to monitor.  She had low phosphorus from refeeding syndrome.  Goals of care: Elderly patient with multiple comorbidities.  Palliative care  consulted and following.  Remains full code.  Pressure Injury 05/10/20 Sacrum Stage 4 - Full thickness tissue loss with exposed bone, tendon or muscle. (Active)  05/10/20   Location: Sacrum  Location Orientation:   Staging: Stage 4 - Full thickness tissue loss with exposed bone, tendon or muscle.  Wound Description (Comments):   Present on Admission: Yes     Pressure Injury 05/10/20 Heel Right Stage 4 - Full thickness tissue loss with exposed bone, tendon or muscle. (Active)  05/10/20   Location: Heel  Location Orientation: Right  Staging: Stage 4 - Full thickness tissue loss with exposed bone, tendon or muscle.  Wound Description (Comments):   Present on Admission: Yes            Nutrition Problem: Severe Malnutrition Etiology: chronic illness (poor mental status/PVD with chronic wounds)      DVT prophylaxis:lovenox Code Status: Full Family Communication: Discussed with Daughter [name] on 05/14/20 Status is: Inpatient  Remains inpatient appropriate  because:Hemodynamically unstable   Dispo: The patient is from: SNF              Anticipated d/c is to: SNF              Patient currently is not medically stable to d/c.   Difficult to place patient No      Consultants: ID  Procedures:None  Antimicrobials:  Anti-infectives (From admission, onward)   Start     Dose/Rate Route Frequency Ordered Stop   05/14/20 1300  vancomycin (VANCOREADY) IVPB 750 mg/150 mL        750 mg 150 mL/hr over 60 Minutes Intravenous Every 24 hours 05/13/20 1415     05/13/20 1345  fluconazole (DIFLUCAN) tablet 100 mg  Status:  Discontinued        100 mg Per Tube Daily 05/13/20 1251 05/13/20 1358   05/13/20 1315  vancomycin (VANCOREADY) IVPB 1250 mg/250 mL        1,250 mg 166.7 mL/hr over 90 Minutes Intravenous  Once 05/13/20 1233 05/13/20 1454   05/11/20 2200  ertapenem (INVANZ) 1,000 mg in sodium chloride 0.9 % 100 mL IVPB        1 g 200 mL/hr over 30 Minutes Intravenous Every 24 hours 05/11/20 0959     05/10/20 2100  vancomycin (VANCOREADY) IVPB 1000 mg/200 mL  Status:  Discontinued        1,000 mg 200 mL/hr over 60 Minutes Intravenous Every 24 hours 05/09/20 2040 05/11/20  1124   05/10/20 0800  ceFEPIme (MAXIPIME) 2 g in sodium chloride 0.9 % 100 mL IVPB  Status:  Discontinued        2 g 200 mL/hr over 30 Minutes Intravenous Every 12 hours 05/09/20 2040 05/09/20 2146   05/09/20 2300  meropenem (MERREM) 2 g in sodium chloride 0.9 % 100 mL IVPB        2 g 200 mL/hr over 30 Minutes Intravenous Every 12 hours 05/09/20 2146 05/11/20 1034   05/09/20 2000  vancomycin (VANCOREADY) IVPB 1000 mg/200 mL        1,000 mg 200 mL/hr over 60 Minutes Intravenous  Once 05/09/20 1908 05/09/20 2157   05/09/20 1915  ceFEPIme (MAXIPIME) 2 g in sodium chloride 0.9 % 100 mL IVPB        2 g 200 mL/hr over 30 Minutes Intravenous  Once 05/09/20 1908 05/09/20 1959   05/09/20 1915  metroNIDAZOLE (FLAGYL) IVPB 500 mg        500 mg 100 mL/hr over 60 Minutes Intravenous  Once  05/09/20 1908 05/09/20 2056      Subjective: Patient seen and examined the bedside this morning.  Blood pressure is soft but overall she is hemodynamically stable.  She was alert and awake.  She told the correct month and year.  She denies any complaints.  Looks very deconditioned and weak. Objective: Vitals:   05/14/20 0350 05/14/20 0437 05/14/20 0700 05/14/20 0800  BP: (!) 126/50   (!) 96/43  Pulse: 69  (P) 73 69  Resp: 19  (P) 20 18  Temp: 99.6 F (37.6 C)  (P) 99.6 F (37.6 C) 99.6 F (37.6 C)  TempSrc: Oral  (P) Oral Oral  SpO2: 100%   100%  Weight:  62.1 kg    Height:        Intake/Output Summary (Last 24 hours) at 05/14/2020 0850 Last data filed at 05/14/2020 0841 Gross per 24 hour  Intake 200 ml  Output 400 ml  Net -200 ml   Filed Weights   05/09/20 1916 05/12/20 0631 05/14/20 0437  Weight: 62.1 kg 62.1 kg 62.1 kg    Examination:  General exam: Extremely deconditioned, debilitated, chronically ill looking HEENT: Feeding tube Respiratory system: Bilateral diminished breath sounds, no wheezes or crackles  Cardiovascular system: S1 & S2 heard, RRR. No JVD, murmurs, rubs, gallops or clicks.  Trace pedal edema. Gastrointestinal system: Abdomen is nondistended, soft and nontender. Central nervous system: Alert and awake Extremities: Trace lower extremity edema, ulcer on the right foot Skin: Pressure ulcers described above, edema of the skin..     Data Reviewed: I have personally reviewed following labs and imaging studies  CBC: Recent Labs  Lab 05/09/20 1906 05/10/20 0924 05/11/20 0500 05/12/20 0431 05/13/20 0058 05/14/20 0500  WBC 11.2* 14.7* 11.8* 11.3* 14.9* 15.6*  NEUTROABS 9.2*  --  8.1*  --  11.5* 11.5*  HGB 9.6* 7.5* 7.0* 6.9* 7.4* 6.4*  HCT 29.8* 23.3* 20.8* 20.3* 22.0* 19.6*  MCV 94.9 94.3 92.9 93.5 92.8 95.1  PLT 215 165 160 152 162 825*   Basic Metabolic Panel: Recent Labs  Lab 05/10/20 0924 05/11/20 0500 05/12/20 0431 05/13/20 0058  05/13/20 2128 05/14/20 0500  NA 136 137 133* 138  --  140  K 3.4* 4.0 3.1* 5.0  --  4.7  CL 108 108 105 110  --  110  CO2 $Re'22 22 23 24  'Pfz$ --  24  GLUCOSE 173* 62* 387* 209*  --  217*  BUN '12 13 11 16  '$ --  22  CREATININE 1.12* 1.21* 1.16* 1.19*  --  1.34*  CALCIUM 7.1* 7.2* 6.8* 7.0*  --  7.0*  MG  --  1.6* 1.8  --   --  2.0  PHOS  --   --  1.8* 1.3* 4.4 4.1   GFR: Estimated Creatinine Clearance: 33.4 mL/min (A) (by C-G formula based on SCr of 1.34 mg/dL (H)). Liver Function Tests: Recent Labs  Lab 05/09/20 1906 05/11/20 0500 05/14/20 0500  AST 43* 31 27  ALT $Re'24 22 18  'HOR$ ALKPHOS 168* 127* 232*  BILITOT 0.8 1.0 0.6  PROT 5.6* 4.1* 4.5*  ALBUMIN 1.2* <1.0* <1.0*   No results for input(s): LIPASE, AMYLASE in the last 168 hours. No results for input(s): AMMONIA in the last 168 hours. Coagulation Profile: Recent Labs  Lab 05/09/20 1906  INR 1.2   Cardiac Enzymes: No results for input(s): CKTOTAL, CKMB, CKMBINDEX, TROPONINI in the last 168 hours. BNP (last 3 results) Recent Labs    08/11/19 1638 08/23/19 1701  PROBNP 2,473* 3,638*   HbA1C: No results for input(s): HGBA1C in the last 72 hours. CBG: Recent Labs  Lab 05/13/20 1625 05/13/20 2039 05/13/20 2359 05/14/20 0351 05/14/20 0756  GLUCAP 191* 168* 169* 192* 184*   Lipid Profile: No results for input(s): CHOL, HDL, LDLCALC, TRIG, CHOLHDL, LDLDIRECT in the last 72 hours. Thyroid Function Tests: No results for input(s): TSH, T4TOTAL, FREET4, T3FREE, THYROIDAB in the last 72 hours. Anemia Panel: Recent Labs    05/13/20 0058  VITAMINB12 737  FOLATE 4.5*  RETICCTPCT 2.2   Sepsis Labs: Recent Labs  Lab 05/09/20 2132 05/11/20 0500 05/13/20 1302 05/13/20 1623  LATICACIDVEN 2.3* 1.4 1.9 1.2    Recent Results (from the past 240 hour(s))  Blood Culture (routine x 2)     Status: None (Preliminary result)   Collection Time: 05/09/20  7:25 PM   Specimen: BLOOD  Result Value Ref Range Status   Specimen  Description BLOOD SITE NOT SPECIFIED  Final   Special Requests   Final    BOTTLES DRAWN AEROBIC AND ANAEROBIC Blood Culture adequate volume   Culture   Final    NO GROWTH 4 DAYS Performed at [facility], [place] [address] [place], [place] [address]    Report Status PENDING  Incomplete  Resp Panel by RT-PCR (Flu A&B, Covid) Nasopharyngeal Swab     Status: None   Collection Time: 05/09/20  7:30 PM   Specimen: Nasopharyngeal Swab; Nasopharyngeal(NP) swabs in vial transport medium  Result Value Ref Range Status   SARS Coronavirus 2 by RT PCR NEGATIVE NEGATIVE Final    Comment: (NOTE) SARS-CoV-2 target nucleic acids are NOT DETECTED.  The SARS-CoV-2 RNA is generally detectable in upper respiratory specimens during the acute phase of infection. The lowest concentration of SARS-CoV-2 viral copies this assay can detect is 138 copies/mL. A negative result does not preclude SARS-Cov-2 infection and should not be used as the sole basis for treatment or other patient management decisions. A negative result may occur with  improper specimen collection/handling, submission of specimen other than nasopharyngeal swab, presence of viral mutation(s) within the areas targeted by this assay, and inadequate number of viral copies(<138 copies/mL). A negative result must be combined with clinical observations, patient history, and epidemiological information. The expected result is Negative.  Fact Sheet for Patients:  EntrepreneurPulse.com.au  Fact Sheet for Healthcare Providers:  IncredibleEmployment.be  This test is no t yet approved or cleared  by the [place] and  has been authorized for detection and/or diagnosis of SARS-CoV-2 by FDA under an Emergency Use Authorization (EUA). This EUA will remain  in effect (meaning this test can be used) for the duration of the COVID-19 declaration under Section 564(b)(1) of the Act, 21 U.S.C.section  360bbb-3(b)(1), unless the authorization is terminated  or revoked sooner.       Influenza A by PCR NEGATIVE NEGATIVE Final   Influenza B by PCR NEGATIVE NEGATIVE Final    Comment: (NOTE) The Xpert Xpress SARS-CoV-2/FLU/RSV plus assay is intended as an aid in the diagnosis of influenza from Nasopharyngeal swab specimens and should not be used as a sole basis for treatment. Nasal washings and aspirates are unacceptable for Xpert Xpress SARS-CoV-2/FLU/RSV testing.  Fact Sheet for Patients: EntrepreneurPulse.com.au  Fact Sheet for Healthcare Providers: IncredibleEmployment.be  This test is not yet approved or cleared by the [place] FDA and has been authorized for detection and/or diagnosis of SARS-CoV-2 by FDA under an Emergency Use Authorization (EUA). This EUA will remain in effect (meaning this test can be used) for the duration of the COVID-19 declaration under Section 564(b)(1) of the Act, 21 U.S.C. section 360bbb-3(b)(1), unless the authorization is terminated or revoked.  Performed at [facility], [place] [address] [place], [place] [address]   Urine culture     Status: Abnormal   Collection Time: 05/11/20 10:10 AM   Specimen: In/Out Cath Urine  Result Value Ref Range Status   Specimen Description IN/OUT CATH URINE  Final   Special Requests   Final    NONE Performed at [facility], [place] [address], [place], [place] [address]    Culture 8,000 COLONIES/mL YEAST (A)  Final   Report Status 05/12/2020 FINAL  Final  Culture, blood (routine x 2)     Status: None (Preliminary result)   Collection Time: 05/13/20  1:12 PM   Specimen: BLOOD  Result Value Ref Range Status   Specimen Description BLOOD RIGHT ANTECUBITAL  Final   Special Requests   Final    BOTTLES DRAWN AEROBIC ONLY Blood Culture results may not be optimal due to an inadequate volume of blood received in culture bottles   Culture   Final    NO GROWTH < 24  HOURS Performed at [facility], [place] [address], [place], [place] [address]    Report Status PENDING  Incomplete  Culture, blood (routine x 2)     Status: None (Preliminary result)   Collection Time: 05/13/20  1:29 PM   Specimen: BLOOD RIGHT HAND  Result Value Ref Range Status   Specimen Description BLOOD RIGHT HAND  Final   Special Requests   Final    BOTTLES DRAWN AEROBIC ONLY Blood Culture results may not be optimal due to an inadequate volume of blood received in culture bottles   Culture   Final    NO GROWTH < 24 HOURS Performed at [facility], [place] [address] [place], [place] [address]    Report Status PENDING  Incomplete         Radiology Studies: DG CHEST PORT 1 VIEW  Result Date: 05/13/2020 CLINICAL DATA:  Fever EXAM: PORTABLE CHEST 1 VIEW COMPARISON:  05/09/2020 FINDINGS: Prior TAVR and CABG. Heart is normal size. Tortuous ectatic thoracic aorta. No confluent airspace opacities or effusions. Right internal jugular central line is unchanged. Feeding tube is seen entering the stomach and projects off the lower aspect of the film. IMPRESSION:  Tortuous, ectatic aorta. No acute cardiopulmonary disease. Electronically Signed   By: Rolm Baptise M.D.   On: 05/13/2020 15:35        Scheduled Meds: . atorvastatin  10 mg Per Tube QHS  . Chlorhexidine Gluconate Cloth  6 each Topical Daily  . clopidogrel  75 mg Per Tube Q breakfast  . collagenase   Topical Daily  . enoxaparin (LOVENOX) injection  40 mg Subcutaneous QHS  . feeding supplement (PROSource TF)  45 mL Per Tube TID  . folic acid  1 mg Per Tube Daily  . insulin aspart  0-6 Units Subcutaneous Q4H  . mirtazapine  15 mg Per Tube QHS   Continuous Infusions: . ertapenem 1,000 mg (05/13/20 2120)  . feeding supplement (OSMOLITE 1.5 CAL) 50 mL/hr at 05/13/20 0700  . vancomycin       LOS: 5 days    Time spent:55 mins. More than 50% of that time was spent in counseling and/or coordination of  care.      Shelly Coss, MD [facility] P3/21/2022, 8:50 AM

## 2020-05-15 ENCOUNTER — Inpatient Hospital Stay (HOSPITAL_COMMUNITY): Payer: Medicare Other

## 2020-05-15 DIAGNOSIS — L89154 Pressure ulcer of sacral region, stage 4: Secondary | ICD-10-CM | POA: Diagnosis not present

## 2020-05-15 DIAGNOSIS — G928 Other toxic encephalopathy: Secondary | ICD-10-CM | POA: Diagnosis not present

## 2020-05-15 DIAGNOSIS — L89613 Pressure ulcer of right heel, stage 3: Secondary | ICD-10-CM | POA: Diagnosis not present

## 2020-05-15 DIAGNOSIS — R509 Fever, unspecified: Secondary | ICD-10-CM | POA: Diagnosis not present

## 2020-05-15 LAB — CBC WITH DIFFERENTIAL/PLATELET
Abs Immature Granulocytes: 0.08 10*3/uL — ABNORMAL HIGH (ref 0.00–0.07)
Basophils Absolute: 0.1 10*3/uL (ref 0.0–0.1)
Basophils Relative: 0 %
Eosinophils Absolute: 0.6 10*3/uL — ABNORMAL HIGH (ref 0.0–0.5)
Eosinophils Relative: 5 %
HCT: 18.4 % — ABNORMAL LOW (ref 36.0–46.0)
Hemoglobin: 6.2 g/dL — CL (ref 12.0–15.0)
Immature Granulocytes: 1 %
Lymphocytes Relative: 16 %
Lymphs Abs: 2 10*3/uL (ref 0.7–4.0)
MCH: 32 pg (ref 26.0–34.0)
MCHC: 33.7 g/dL (ref 30.0–36.0)
MCV: 94.8 fL (ref 80.0–100.0)
Monocytes Absolute: 1.2 10*3/uL — ABNORMAL HIGH (ref 0.1–1.0)
Monocytes Relative: 10 %
Neutro Abs: 8.5 10*3/uL — ABNORMAL HIGH (ref 1.7–7.7)
Neutrophils Relative %: 68 %
Platelets: 158 10*3/uL (ref 150–400)
RBC: 1.94 MIL/uL — ABNORMAL LOW (ref 3.87–5.11)
RDW: 17.9 % — ABNORMAL HIGH (ref 11.5–15.5)
WBC: 12.4 10*3/uL — ABNORMAL HIGH (ref 4.0–10.5)
nRBC: 0 % (ref 0.0–0.2)

## 2020-05-15 LAB — BASIC METABOLIC PANEL
Anion gap: 6 (ref 5–15)
BUN: 31 mg/dL — ABNORMAL HIGH (ref 8–23)
CO2: 25 mmol/L (ref 22–32)
Calcium: 7.4 mg/dL — ABNORMAL LOW (ref 8.9–10.3)
Chloride: 111 mmol/L (ref 98–111)
Creatinine, Ser: 1.25 mg/dL — ABNORMAL HIGH (ref 0.44–1.00)
GFR, Estimated: 45 mL/min — ABNORMAL LOW (ref >60)
Glucose, Bld: 194 mg/dL — ABNORMAL HIGH (ref 70–99)
Potassium: 5 mmol/L (ref 3.5–5.1)
Sodium: 142 mmol/L (ref 135–145)

## 2020-05-15 LAB — GLUCOSE, CAPILLARY
Glucose-Capillary: 122 mg/dL — ABNORMAL HIGH (ref 70–99)
Glucose-Capillary: 127 mg/dL — ABNORMAL HIGH (ref 70–99)
Glucose-Capillary: 139 mg/dL — ABNORMAL HIGH (ref 70–99)
Glucose-Capillary: 156 mg/dL — ABNORMAL HIGH (ref 70–99)
Glucose-Capillary: 162 mg/dL — ABNORMAL HIGH (ref 70–99)
Glucose-Capillary: 167 mg/dL — ABNORMAL HIGH (ref 70–99)
Glucose-Capillary: 90 mg/dL (ref 70–99)

## 2020-05-15 LAB — PHOSPHORUS: Phosphorus: 3.6 mg/dL (ref 2.5–4.6)

## 2020-05-15 LAB — CULTURE, BLOOD (ROUTINE X 2)
Culture: NO GROWTH
Special Requests: ADEQUATE

## 2020-05-15 NOTE — Progress Notes (Signed)
Subjective: Patient with no new complaints   Antibiotics:  Anti-infectives (From admission, onward)   Start     Dose/Rate Route Frequency Ordered Stop   05/14/20 1300  vancomycin (VANCOREADY) IVPB 750 mg/150 mL  Status:  Discontinued        750 mg 150 mL/hr over 60 Minutes Intravenous Every 24 hours 05/13/20 1415 05/15/20 1015   05/13/20 1345  fluconazole (DIFLUCAN) tablet 100 mg  Status:  Discontinued        100 mg Per Tube Daily 05/13/20 1251 05/13/20 1358   05/13/20 1315  vancomycin (VANCOREADY) IVPB 1250 mg/250 mL        1,250 mg 166.7 mL/hr over 90 Minutes Intravenous  Once 05/13/20 1233 05/13/20 1454   05/11/20 2200  ertapenem (INVANZ) 1,000 mg in sodium chloride 0.9 % 100 mL IVPB        1 g 200 mL/hr over 30 Minutes Intravenous Every 24 hours 05/11/20 0959     05/10/20 2100  vancomycin (VANCOREADY) IVPB 1000 mg/200 mL  Status:  Discontinued        1,000 mg 200 mL/hr over 60 Minutes Intravenous Every 24 hours 05/09/20 2040 05/11/20 1124   05/10/20 0800  ceFEPIme (MAXIPIME) 2 g in sodium chloride 0.9 % 100 mL IVPB  Status:  Discontinued        2 g 200 mL/hr over 30 Minutes Intravenous Every 12 hours 05/09/20 2040 05/09/20 2146   05/09/20 2300  meropenem (MERREM) 2 g in sodium chloride 0.9 % 100 mL IVPB        2 g 200 mL/hr over 30 Minutes Intravenous Every 12 hours 05/09/20 2146 05/11/20 1034   05/09/20 2000  vancomycin (VANCOREADY) IVPB 1000 mg/200 mL        1,000 mg 200 mL/hr over 60 Minutes Intravenous  Once 05/09/20 1908 05/09/20 2157   05/09/20 1915  ceFEPIme (MAXIPIME) 2 g in sodium chloride 0.9 % 100 mL IVPB        2 g 200 mL/hr over 30 Minutes Intravenous  Once 05/09/20 1908 05/09/20 1959   05/09/20 1915  metroNIDAZOLE (FLAGYL) IVPB 500 mg        500 mg 100 mL/hr over 60 Minutes Intravenous  Once 05/09/20 1908 05/09/20 2056      Medications: Scheduled Meds: . atorvastatin  10 mg Per Tube QHS  . Chlorhexidine Gluconate Cloth  6 each Topical Daily   . clopidogrel  75 mg Per Tube Q breakfast  . collagenase   Topical Daily  . enoxaparin (LOVENOX) injection  40 mg Subcutaneous QHS  . feeding supplement (PROSource TF)  45 mL Per Tube TID  . folic acid  1 mg Per Tube Daily  . insulin aspart  0-6 Units Subcutaneous Q4H  . mirtazapine  15 mg Per Tube QHS   Continuous Infusions: . ertapenem 1,000 mg (05/14/20 2145)  . feeding supplement (OSMOLITE 1.5 CAL) 50 mL/hr at 05/13/20 0700   PRN Meds:.acetaminophen **OR** acetaminophen, gadobutrol, ipratropium-albuterol, ondansetron **OR** ondansetron (ZOFRAN) IV, oxyCODONE-acetaminophen    Objective: Weight change:   Intake/Output Summary (Last 24 hours) at 05/15/2020 1217 Last data filed at 05/15/2020 0620 Gross per 24 hour  Intake --  Output 550 ml  Net -550 ml   Blood pressure (!) 132/51, pulse 83, temperature 97.9 F (36.6 C), temperature source Oral, resp. rate 16, height $RemoveBe'5\' 6"'dOakNYtBu$  (1.676 m), weight 62.1 kg, SpO2 100 %. Temp:  [97.8 F (36.6 C)-100.1 F (37.8 C)] 97.9 F (36.6 C) (  03/22 1150) Pulse Rate:  [73-85] 83 (03/22 1150) Resp:  [13-20] 16 (03/22 1150) BP: (100-132)/(39-74) 132/51 (03/22 1150) SpO2:  [94 %-100 %] 100 % (03/22 1150)  Physical Exam: Physical Exam Eyes:     Extraocular Movements: Extraocular movements intact.  Cardiovascular:     Rate and Rhythm: Normal rate.     Heart sounds: Murmur heard.  No gallop.   Pulmonary:     Effort: Pulmonary effort is normal. No respiratory distress.     Breath sounds: Normal breath sounds.  Abdominal:     General: Bowel sounds are normal. There is no distension.     Palpations: There is no mass.  Neurological:     Mental Status: She is alert.     Comments: Oriented to person and place  Psychiatric:        Cognition and Memory: Cognition is impaired. Memory is impaired.     Right foot 3/17:      "" 3/18:    Decubitus ulcer not examined today.  CBC:    BMET Recent Labs    05/14/20 0500 05/15/20 0500   NA 140 142  K 4.7 5.0  CL 110 111  CO2 24 25  GLUCOSE 217* 194*  BUN 22 31*  CREATININE 1.34* 1.25*  CALCIUM 7.0* 7.4*     Liver Panel  Recent Labs    05/14/20 0500  PROT 4.5*  ALBUMIN <1.0*  AST 27  ALT 18  ALKPHOS 232*  BILITOT 0.6       Sedimentation Rate Recent Labs    05/14/20 0500  ESRSEDRATE 45*   C-Reactive Protein Recent Labs    05/14/20 0500  CRP 1.3*    Micro Results: Recent Results (from the past 720 hour(s))  Blood Culture (routine x 2)     Status: None   Collection Time: 05/09/20  7:25 PM   Specimen: BLOOD  Result Value Ref Range Status   Specimen Description BLOOD SITE NOT SPECIFIED  Final   Special Requests   Final    BOTTLES DRAWN AEROBIC AND ANAEROBIC Blood Culture adequate volume   Culture   Final    NO GROWTH 5 DAYS Performed at [facility], [address], [place], [place] [address]    Report Status 05/15/2020 FINAL  Final  Resp Panel by RT-PCR (Flu A&B, Covid) Nasopharyngeal Swab     Status: None   Collection Time: 05/09/20  7:30 PM   Specimen: Nasopharyngeal Swab; Nasopharyngeal(NP) swabs in vial transport medium  Result Value Ref Range Status   SARS Coronavirus 2 by RT PCR NEGATIVE NEGATIVE Final    Comment: (NOTE) SARS-CoV-2 target nucleic acids are NOT DETECTED.  The SARS-CoV-2 RNA is generally detectable in upper respiratory specimens during the acute phase of infection. The lowest concentration of SARS-CoV-2 viral copies this assay can detect is 138 copies/mL. A negative result does not preclude SARS-Cov-2 infection and should not be used as the sole basis for treatment or other patient management decisions. A negative result may occur with  improper specimen collection/handling, submission of specimen other than nasopharyngeal swab, presence of viral mutation(s) within the areas targeted by this assay, and inadequate number of viral copies(<138 copies/mL). A negative result must be combined with clinical  observations, patient history, and epidemiological information. The expected result is Negative.  Fact Sheet for Patients:  EntrepreneurPulse.com.au  Fact Sheet for Healthcare Providers:  IncredibleEmployment.be  This test is no t yet approved or cleared by the [place] FDA and  has  been authorized for detection and/or diagnosis of SARS-CoV-2 by FDA under an Emergency Use Authorization (EUA). This EUA will remain  in effect (meaning this test can be used) for the duration of the COVID-19 declaration under Section 564(b)(1) of the Act, 21 U.S.C.section 360bbb-3(b)(1), unless the authorization is terminated  or revoked sooner.       Influenza A by PCR NEGATIVE NEGATIVE Final   Influenza B by PCR NEGATIVE NEGATIVE Final    Comment: (NOTE) The Xpert Xpress SARS-CoV-2/FLU/RSV plus assay is intended as an aid in the diagnosis of influenza from Nasopharyngeal swab specimens and should not be used as a sole basis for treatment. Nasal washings and aspirates are unacceptable for Xpert Xpress SARS-CoV-2/FLU/RSV testing.  Fact Sheet for Patients: EntrepreneurPulse.com.au  Fact Sheet for Healthcare Providers: IncredibleEmployment.be  This test is not yet approved or cleared by the [place] FDA and has been authorized for detection and/or diagnosis of SARS-CoV-2 by FDA under an Emergency Use Authorization (EUA). This EUA will remain in effect (meaning this test can be used) for the duration of the COVID-19 declaration under Section 564(b)(1) of the Act, 21 U.S.C. section 360bbb-3(b)(1), unless the authorization is terminated or revoked.  Performed at [facility], [place] [address] [place], [place] [address]   Urine culture     Status: Abnormal   Collection Time: 05/11/20 10:10 AM   Specimen: In/Out Cath Urine  Result Value Ref Range Status   Specimen Description IN/OUT CATH URINE  Final   Special  Requests   Final    NONE Performed at [facility], [place] [address] [place], [place] [address]    Culture 8,000 COLONIES/mL YEAST (A)  Final   Report Status 05/12/2020 FINAL  Final  Culture, blood (routine x 2)     Status: None (Preliminary result)   Collection Time: 05/13/20  1:12 PM   Specimen: BLOOD  Result Value Ref Range Status   Specimen Description BLOOD RIGHT ANTECUBITAL  Final   Special Requests   Final    BOTTLES DRAWN AEROBIC ONLY Blood Culture results may not be optimal due to an inadequate volume of blood received in culture bottles   Culture   Final    NO GROWTH 2 DAYS Performed at [facility], [place] [address] [place], [place] [address]    Report Status PENDING  Incomplete  Culture, blood (routine x 2)     Status: None (Preliminary result)   Collection Time: 05/13/20  1:29 PM   Specimen: BLOOD RIGHT HAND  Result Value Ref Range Status   Specimen Description BLOOD RIGHT HAND  Final   Special Requests   Final    BOTTLES DRAWN AEROBIC ONLY Blood Culture results may not be optimal due to an inadequate volume of blood received in culture bottles   Culture   Final    NO GROWTH 2 DAYS Performed at [facility], [place] [address] [place], [place] [address]    Report Status PENDING  Incomplete    Studies/Results: DG CHEST PORT 1 VIEW  Result Date: 05/14/2020 CLINICAL DATA:  Shortness of breath, altered mental status, history hypertension, CHF, stroke, coronary artery disease, diabetes mellitus EXAM: PORTABLE CHEST 1 VIEW COMPARISON:  Portable exam 1840 hours compared to 05/13/2020 FINDINGS: RIGHT jugular line with tip projecting over cavoatrial junction. Normal heart size post TAVR and CABG. Atherosclerotic calcification aorta. Mediastinal contours and pulmonary vascularity normal. Lungs clear. No pulmonary infiltrate, pleural effusion or pneumothorax. Bones demineralized. IMPRESSION: No acute  abnormalities. Electronically Signed   By: Lavonia Dana M.D.   On:  05/14/2020 20:42   DG CHEST PORT 1 VIEW  Result Date: 05/13/2020 CLINICAL DATA:  Fever EXAM: PORTABLE CHEST 1 VIEW COMPARISON:  05/09/2020 FINDINGS: Prior TAVR and CABG. Heart is normal size. Tortuous ectatic thoracic aorta. No confluent airspace opacities or effusions. Right internal jugular central line is unchanged. Feeding tube is seen entering the stomach and projects off the lower aspect of the film. IMPRESSION: Tortuous, ectatic aorta. No acute cardiopulmonary disease. Electronically Signed   By: Rolm Baptise M.D.   On: 05/13/2020 15:35      Assessment/Plan:  INTERVAL HISTORY: Afebrile now. Principal Problem:   Toxic metabolic encephalopathy Active Problems:   DM2 (diabetes mellitus, type 2) ([place])   Hypertension   Hypoglycemia   Benign hypertensive heart and kidney disease with diastolic CHF, NYHA class 3 and CKD stage 3 (HCC)   Sacral decubitus ulcer, stage IV (HCC)   Sepsis (HCC)   Catheter-associated urinary tract infection ([place])   Hypothermia   Decubitus ulcer of right heel, stage 3 (HCC)   Anasarca   Protein-calorie malnutrition, severe   Hypoalbuminemia    [name] is a 75 y.o. female with multiple medical problems and dementia with bedbound status and large sacral decubitus ulcer, history of pacemaker the became infected with group B streptococcus in the context of group B streptococcus bacteremia status post extraction of the pacemaker in 2021 with placement of new Medtronic Micra.  Patient also underwent a TAVR.  (Note I have discussed her case via secure chat with both Dr. Curt Bears as well as the patient's cardiology PA Lattie Corns and they have told me that both the pacemaker and the T AVR are MRI safe)  More recently she was admitted to the hospital in February worsening of her sacral wound along with a diabetic foot ulcer with evidence of osteomyelitis on imaging.  She underwent surgery at the podiatry who removed a piece of bone from her  calcaneus and sent this to the lab in the path lab no evidence of osteomyelitis was seen under the microscope.  No cultures were done at that time.  She did have a prior culture from her wound that grew multiple organisms including Morganella.  Dr. Juleen China recommended a course of ertapenem which the patient was continuing on.  She was seen by Janene Madeira 2 days prior to admission and appeared to be doing well.  Her inflammatories had been trending.  In the interim however she experienced increasing confusion hypothermia and hypotension in the setting context of hypoglycemia.  She was readmitted to the hospital blood cultures were taken urine cultures were taken and she was placed on broad-spectrum antibiotics with vancomycin and cefepime and then vancomycin and meropenem.  Attained an MRI of the foot last week which was reassuring and then narrowed her back to ertapenem.  Over the weekend she had a low-grade fever and blood cultures were collected again and vancomycin reinstituted.  For what is aware it is worth her white cell are trending upward again along with her inflammatory markers.  With blood cultures negative I am stopping her vancomycin as I do not know what we are treating with it at this point.  I also like to increase the yield and any potential deep cultures that might be obtained if for example an abscess is seen on MRI   I will get an MRI of her lumbosacral area with contrast.  Her pacemaker will  need to be reprogrammed again afterwards.   If we do not identify something that merits long-term systemic antibiotics would discontinue her central line.    Failure to thrive: I do not see her improving long-term with her poor nutritional status and in bedbound.  She will likely have recurrent hospitalizations in the future unfortunately.      LOS: 6 days   Alcide Evener 05/15/2020, 12:17 PM

## 2020-05-15 NOTE — Progress Notes (Signed)
Per order, changed device settings to MRI surescan, VOO 80 bpm for MRI. Will resume regular settings and send transmission after scan.

## 2020-05-15 NOTE — Progress Notes (Signed)
PROGRESS NOTE    [name]  JQB:[id number] DOB: 1945-05-12 DOA: 05/09/2020 PCP: Hendricks Limes, MD   Chief Complain: Altered mental,hypoglycemia  Brief Narrative: TONJIA PARILLO a 75 y.o.femalewith medical history significant of breast CA in 1990's  s/p mastectomy, chemotherapy, CVA, AS s/p AVR in 2006, then TAVR in 2021, HFpEF + CHB + PPM (placed 01/2019)-bacteremia group B strep 06/2019 status post PPM explantation and then subsequent Medtronic Micra, h/o right lower extremity ischemia status post angiography in 12/2019, h/o insulin dependent diabetes .She was admitted from  2/8 - 2/16 with critical ischemia of R leg and open ulcer of heel growing Morganella morganii on culture. Other issues included Sacral decubitus ulcer, and urinary retention.Patient was discharged on Invanz through March 25th She has FTT ,has been at nursing home for about a year, family noticed progressive decline for about 42-month, with poor oral intake, weight loss of 30 to 40 pounds, has been bedbound, needing to be fed. She was brought to the ED from nursing home this time  due to altered mental status, found to have hypoglycemia, blood glucose 29, hypothermia and hypotension.  She was admitted for further management of acute metabolic encephalopathy possible sepsis from sacral decubitus ulcer/heel ulcer.  Currently on broad-spectrum antibiotics.  ID following.  Palliative care  also consulted for goals of care.  Remains full code  Assessment & Plan:   Principal Problem:   Toxic metabolic encephalopathy Active Problems:   DM2 (diabetes mellitus, type 2) (HCC)   Hypertension   Hypoglycemia   Benign hypertensive heart and kidney disease with diastolic CHF, NYHA class 3 and CKD stage 3 (HCC)   Sacral decubitus ulcer, stage IV (HCC)   Sepsis (HCC)   Catheter-associated urinary tract infection (HCC)   Hypothermia   Decubitus ulcer of right heel, stage 3 (HCC)   Anasarca   Protein-calorie  malnutrition, severe   Hypoalbuminemia   Acute metabolic encephalopathy: Present on admission.  ABG done 05/13/2020 did not show any hypercarbia.  Monitor mental status.  Mental status might have  improved.  Suspected sepsis: Presented with hypothermia, hypotension, leukocytosis, lactic acidosis.  Source of infection could be sacral decubitus ulcer/heel ulcer.  Right foot MRI showed  prominent ulceration along the posterior heel with bands of enhancement within the marrow,no overt bony destruction,suspicious for early osteomyelitis,subcutaneous edema and low-grade enhancement medial and lateral to the ankle potentially reflecting cellulitis. Indwelling tunneled catheter looks clean.  Blood cultures have not shown any growth.  Urine culture showed yeast.  Indwelling Foley catheter change in the emergency department.  She was started on broad-spectrum antibiotics.  ID consulted and following.  Currently on Invanz, vancomycin. Spikedt fever on 3/20 with worsening WBC count worsening mental status.  Chest x-ray did not show  any pneumonia.  She is afebrile today.  Since she also developed leukocytosis, ID has ordered MRI of the lumbar spine to rule out deep abscess.  Acute on chronic normocytic anemia/thrombocytopenia: Hemoglobin dropped to 6.2 today.  Patient is Jehovah's Witness ,doesnot accept any blood products.  Continue to monitor hemoglobin.  No evidence of active bleeding. Iron studies showed normal iron  Hypoglycemia/history of type 2 diabetes: On presentation, she was hypoglycemic with blood sugar of 29.  She was initially given D10 infusion.  Also started on tube feeds.  Currently on sliding scale.  Blood sugar stable  History of hypertension: Presented with hypotension.  Blood pressure has normalized.  Monitor.  Antihypertensives on hold.  Hypothermia: Resolved.  Now has mild  grade fever  CKD stage IIIa: Currently kidney function at baseline.  Monitor.  Avoid nephrotoxins.  History of  peripheral vascular disease: She has history of right lower extremity ischemia status post angiography on 11/21.  On , Plavix, statin, being given by tube  History of hyperlipidemia: On atorvastatin.  History of CVA:H/o AS s/p AVR in 2006, then TAVR in 2021,  H/o HFpEF + CHB + PPM  H/o bacteremia group B strep 06/2019 status post PPM explantation and then subsequent Medtronic Micra placement On Aspirin/Plavix/statin   History of breast cancer: Status post left mastectomy, chemotherapy.  Now in remission.  Severe protein calorie malnutrition/poor oral intake: Due to immobility, bedbound status.  History of 40 pound weight loss in the last 4 months.  Albumin less than 1.  Nutrition following.  Currently on tube feeding diet.  I do not think she is a candidate for PEG  Severe electrolyte abnormalities: Currently stable.  Most likely from poor oral intake.  Continue to monitor.  She had low phosphorus from refeeding syndrome,now normalised.  Goals of care: Elderly patient with multiple comorbidities.  Palliative care  consulted and following.  Remains full code.  Extensive discussion have been held about goals of care multiple times focusing on her poor prognosis, multiple comorbidities.  Family opting for aggressive treatment.  Pressure Injury 05/10/20 Sacrum Stage 4 - Full thickness tissue loss with exposed bone, tendon or muscle. (Active)  05/10/20   Location: Sacrum  Location Orientation:   Staging: Stage 4 - Full thickness tissue loss with exposed bone, tendon or muscle.  Wound Description (Comments):   Present on Admission: Yes     Pressure Injury 05/10/20 Heel Right Stage 4 - Full thickness tissue loss with exposed bone, tendon or muscle. (Active)  05/10/20   Location: Heel  Location Orientation: Right  Staging: Stage 4 - Full thickness tissue loss with exposed bone, tendon or muscle.  Wound Description (Comments):   Present on Admission: Yes            Nutrition Problem:  Severe Malnutrition Etiology: chronic illness (poor mental status/PVD with chronic wounds)      DVT prophylaxis:lovenox Code Status: Full Family Communication: Discussed with Daughter Rechel daily Status is: Inpatient  Remains inpatient appropriate because:Hemodynamically unstable   Dispo: The patient is from: SNF              Anticipated d/c is to: SNF              Patient currently is not medically stable to d/c.   Difficult to place patient No      Consultants: ID  Procedures:None  Antimicrobials:  Anti-infectives (From admission, onward)   Start     Dose/Rate Route Frequency Ordered Stop   05/14/20 1300  vancomycin (VANCOREADY) IVPB 750 mg/150 mL        750 mg 150 mL/hr over 60 Minutes Intravenous Every 24 hours 05/13/20 1415     05/13/20 1345  fluconazole (DIFLUCAN) tablet 100 mg  Status:  Discontinued        100 mg Per Tube Daily 05/13/20 1251 05/13/20 1358   05/13/20 1315  vancomycin (VANCOREADY) IVPB 1250 mg/250 mL        1,250 mg 166.7 mL/hr over 90 Minutes Intravenous  Once 05/13/20 1233 05/13/20 1454   05/11/20 2200  ertapenem (INVANZ) 1,000 mg in sodium chloride 0.9 % 100 mL IVPB        1 g 200 mL/hr over 30 Minutes Intravenous Every 24 hours  05/11/20 0959     05/10/20 2100  vancomycin (VANCOREADY) IVPB 1000 mg/200 mL  Status:  Discontinued        1,000 mg 200 mL/hr over 60 Minutes Intravenous Every 24 hours 05/09/20 2040 05/11/20 1124   05/10/20 0800  ceFEPIme (MAXIPIME) 2 g in sodium chloride 0.9 % 100 mL IVPB  Status:  Discontinued        2 g 200 mL/hr over 30 Minutes Intravenous Every 12 hours 05/09/20 2040 05/09/20 2146   05/09/20 2300  meropenem (MERREM) 2 g in sodium chloride 0.9 % 100 mL IVPB        2 g 200 mL/hr over 30 Minutes Intravenous Every 12 hours 05/09/20 2146 05/11/20 1034   05/09/20 2000  vancomycin (VANCOREADY) IVPB 1000 mg/200 mL        1,000 mg 200 mL/hr over 60 Minutes Intravenous  Once 05/09/20 1908 05/09/20 2157   05/09/20 1915   ceFEPIme (MAXIPIME) 2 g in sodium chloride 0.9 % 100 mL IVPB        2 g 200 mL/hr over 30 Minutes Intravenous  Once 05/09/20 1908 05/09/20 1959   05/09/20 1915  metroNIDAZOLE (FLAGYL) IVPB 500 mg        500 mg 100 mL/hr over 60 Minutes Intravenous  Once 05/09/20 1908 05/09/20 2056      Subjective: Patient seen and examined the bedside this morning.  Hemodynamically stable during my evaluation.  She was sleeping.  Woke up and open her eyes on calling her name and spoke but she is confused to time.  Denies any complaints.  Looks very weak, debilitated.    Objective: Vitals:   05/14/20 2014 05/15/20 0003 05/15/20 0300 05/15/20 0400  BP: (!) 102/45 103/74 (!) 111/53 (!) 108/56  Pulse: 85 75 76 74  Resp: $Remo'15 16 13 13  'MuSqt$ Temp: 97.8 F (36.6 C) 97.8 F (36.6 C)  97.9 F (36.6 C)  TempSrc: Oral Oral  Oral  SpO2: 100% 95% 100% 100%  Weight:      Height:        Intake/Output Summary (Last 24 hours) at 05/15/2020 0732 Last data filed at 05/15/2020 3785 Gross per 24 hour  Intake --  Output 950 ml  Net -950 ml   Filed Weights   05/09/20 1916 05/12/20 0631 05/14/20 0437  Weight: 62.1 kg 62.1 kg 62.1 kg    Examination:  General exam: Chronically ill looking, very weak HEENT: PERRL, feeding tube Respiratory system: Diminished air sounds on the bases Cardiovascular system: S1 & S2 heard, RRR.  Gastrointestinal system: Abdomen is nondistended, soft and nontender. Central nervous system: Alert and awake Extremities: No edema, no clubbing ,no cyanosis Skin: Pressure ulcers on the back and right  heel    Data Reviewed: I have personally reviewed following labs and imaging studies  CBC: Recent Labs  Lab 05/09/20 1906 05/10/20 0924 05/11/20 0500 05/12/20 0431 05/13/20 0058 05/14/20 0500 05/15/20 0500  WBC 11.2*   < > 11.8* 11.3* 14.9* 15.6* 12.4*  NEUTROABS 9.2*  --  8.1*  --  11.5* 11.5* 8.5*  HGB 9.6*   < > 7.0* 6.9* 7.4* 6.4* 6.2*  HCT 29.8*   < > 20.8* 20.3* 22.0* 19.6*  18.4*  MCV 94.9   < > 92.9 93.5 92.8 95.1 94.8  PLT 215   < > 160 152 162 148* 158   < > = values in this interval not displayed.   Basic Metabolic Panel: Recent Labs  Lab 05/11/20 0500 05/12/20 0431 05/13/20  8828 05/13/20 2128 05/14/20 0500 05/15/20 0500  NA 137 133* 138  --  140 142  K 4.0 3.1* 5.0  --  4.7 5.0  CL 108 105 110  --  110 111  CO2 $Re'22 23 24  'HnS$ --  24 25  GLUCOSE 62* 387* 209*  --  217* 194*  BUN $Re'13 11 16  'lRw$ --  22 31*  CREATININE 1.21* 1.16* 1.19*  --  1.34* 1.25*  CALCIUM 7.2* 6.8* 7.0*  --  7.0* 7.4*  MG 1.6* 1.8  --   --  2.0  --   PHOS  --  1.8* 1.3* 4.4 4.1 3.6   GFR: Estimated Creatinine Clearance: 35.8 mL/min (A) (by C-G formula based on SCr of 1.25 mg/dL (H)). Liver Function Tests: Recent Labs  Lab 05/09/20 1906 05/11/20 0500 05/14/20 0500  AST 43* 31 27  ALT $Re'24 22 18  'EnQ$ ALKPHOS 168* 127* 232*  BILITOT 0.8 1.0 0.6  PROT 5.6* 4.1* 4.5*  ALBUMIN 1.2* <1.0* <1.0*   No results for input(s): LIPASE, AMYLASE in the last 168 hours. No results for input(s): AMMONIA in the last 168 hours. Coagulation Profile: Recent Labs  Lab 05/09/20 1906  INR 1.2   Cardiac Enzymes: No results for input(s): CKTOTAL, CKMB, CKMBINDEX, TROPONINI in the last 168 hours. BNP (last 3 results) Recent Labs    08/11/19 1638 08/23/19 1701  PROBNP 2,473* 3,638*   HbA1C: No results for input(s): HGBA1C in the last 72 hours. CBG: Recent Labs  Lab 05/14/20 1130 05/14/20 1535 05/14/20 2014 05/15/20 0041 05/15/20 0452  GLUCAP 171* 141* 150* 139* 162*   Lipid Profile: No results for input(s): CHOL, HDL, LDLCALC, TRIG, CHOLHDL, LDLDIRECT in the last 72 hours. Thyroid Function Tests: No results for input(s): TSH, T4TOTAL, FREET4, T3FREE, THYROIDAB in the last 72 hours. Anemia Panel: Recent Labs    05/13/20 0058 05/14/20 0909  VITAMINB12 737  --   FOLATE 4.5*  --   FERRITIN  --  566*  TIBC  --  NOT CALCULATED  IRON  --  43  RETICCTPCT 2.2  --    Sepsis  Labs: Recent Labs  Lab 05/09/20 2132 05/11/20 0500 05/13/20 1302 05/13/20 1623  LATICACIDVEN 2.3* 1.4 1.9 1.2    Recent Results (from the past 240 hour(s))  Blood Culture (routine x 2)     Status: None (Preliminary result)   Collection Time: 05/09/20  7:25 PM   Specimen: BLOOD  Result Value Ref Range Status   Specimen Description BLOOD SITE NOT SPECIFIED  Final   Special Requests   Final    BOTTLES DRAWN AEROBIC AND ANAEROBIC Blood Culture adequate volume   Culture   Final    NO GROWTH 4 DAYS Performed at [facility], [place] [address], [place], [place] [address]    Report Status PENDING  Incomplete  Resp Panel by RT-PCR (Flu A&B, Covid) Nasopharyngeal Swab     Status: None   Collection Time: 05/09/20  7:30 PM   Specimen: Nasopharyngeal Swab; Nasopharyngeal(NP) swabs in vial transport medium  Result Value Ref Range Status   SARS Coronavirus 2 by RT PCR NEGATIVE NEGATIVE Final    Comment: (NOTE) SARS-CoV-2 target nucleic acids are NOT DETECTED.  The SARS-CoV-2 RNA is generally detectable in upper respiratory specimens during the acute phase of infection. The lowest concentration of SARS-CoV-2 viral copies this assay can detect is 138 copies/mL. A negative result does not preclude SARS-Cov-2 infection and should not be used as the sole basis  for treatment or other patient management decisions. A negative result may occur with  improper specimen collection/handling, submission of specimen other than nasopharyngeal swab, presence of viral mutation(s) within the areas targeted by this assay, and inadequate number of viral copies(<138 copies/mL). A negative result must be combined with clinical observations, patient history, and epidemiological information. The expected result is Negative.  Fact Sheet for Patients:  EntrepreneurPulse.com.au  Fact Sheet for Healthcare Providers:  IncredibleEmployment.be  This test is no t yet approved  or cleared by the [place] FDA and  has been authorized for detection and/or diagnosis of SARS-CoV-2 by FDA under an Emergency Use Authorization (EUA). This EUA will remain  in effect (meaning this test can be used) for the duration of the COVID-19 declaration under Section 564(b)(1) of the Act, 21 U.S.C.section 360bbb-3(b)(1), unless the authorization is terminated  or revoked sooner.       Influenza A by PCR NEGATIVE NEGATIVE Final   Influenza B by PCR NEGATIVE NEGATIVE Final    Comment: (NOTE) The Xpert Xpress SARS-CoV-2/FLU/RSV plus assay is intended as an aid in the diagnosis of influenza from Nasopharyngeal swab specimens and should not be used as a sole basis for treatment. Nasal washings and aspirates are unacceptable for Xpert Xpress SARS-CoV-2/FLU/RSV testing.  Fact Sheet for Patients: EntrepreneurPulse.com.au  Fact Sheet for Healthcare Providers: IncredibleEmployment.be  This test is not yet approved or cleared by the [place] FDA and has been authorized for detection and/or diagnosis of SARS-CoV-2 by FDA under an Emergency Use Authorization (EUA). This EUA will remain in effect (meaning this test can be used) for the duration of the COVID-19 declaration under Section 564(b)(1) of the Act, 21 U.S.C. section 360bbb-3(b)(1), unless the authorization is terminated or revoked.  Performed at [facility], [place] [address], [place], [place] [address]   Urine culture     Status: Abnormal   Collection Time: 05/11/20 10:10 AM   Specimen: In/Out Cath Urine  Result Value Ref Range Status   Specimen Description IN/OUT CATH URINE  Final   Special Requests   Final    NONE Performed at [facility], [place] [address], [place], [place] [address]    Culture 8,000 COLONIES/mL YEAST (A)  Final   Report Status 05/12/2020 FINAL  Final  Culture, blood (routine x 2)     Status: None (Preliminary result)   Collection Time:  05/13/20  1:12 PM   Specimen: BLOOD  Result Value Ref Range Status   Specimen Description BLOOD RIGHT ANTECUBITAL  Final   Special Requests   Final    BOTTLES DRAWN AEROBIC ONLY Blood Culture results may not be optimal due to an inadequate volume of blood received in culture bottles   Culture   Final    NO GROWTH < 24 HOURS Performed at [facility], [place] [address], [place], [place] [address]    Report Status PENDING  Incomplete  Culture, blood (routine x 2)     Status: None (Preliminary result)   Collection Time: 05/13/20  1:29 PM   Specimen: BLOOD RIGHT HAND  Result Value Ref Range Status   Specimen Description BLOOD RIGHT HAND  Final   Special Requests   Final    BOTTLES DRAWN AEROBIC ONLY Blood Culture results may not be optimal due to an inadequate volume of blood received in culture bottles   Culture   Final    NO GROWTH < 24 HOURS Performed at [facility], [place]  [address] [place], [place] [address]    Report Status PENDING  Incomplete         Radiology Studies: DG CHEST PORT 1 VIEW  Result Date: 05/14/2020 CLINICAL DATA:  Shortness of breath, altered mental status, history hypertension, CHF, stroke, coronary artery disease, diabetes mellitus EXAM: PORTABLE CHEST 1 VIEW COMPARISON:  Portable exam 1840 hours compared to 05/13/2020 FINDINGS: RIGHT jugular line with tip projecting over cavoatrial junction. Normal heart size post TAVR and CABG. Atherosclerotic calcification aorta. Mediastinal contours and pulmonary vascularity normal. Lungs clear. No pulmonary infiltrate, pleural effusion or pneumothorax. Bones demineralized. IMPRESSION: No acute abnormalities. Electronically Signed   By: Lavonia Dana M.D.   On: 05/14/2020 20:42   DG CHEST PORT 1 VIEW  Result Date: 05/13/2020 CLINICAL DATA:  Fever EXAM: PORTABLE CHEST 1 VIEW COMPARISON:  05/09/2020 FINDINGS: Prior TAVR and CABG. Heart is normal size. Tortuous ectatic thoracic aorta. No confluent airspace opacities or  effusions. Right internal jugular central line is unchanged. Feeding tube is seen entering the stomach and projects off the lower aspect of the film. IMPRESSION: Tortuous, ectatic aorta. No acute cardiopulmonary disease. Electronically Signed   By: Rolm Baptise M.D.   On: 05/13/2020 15:35        Scheduled Meds: . atorvastatin  10 mg Per Tube QHS  . Chlorhexidine Gluconate Cloth  6 each Topical Daily  . clopidogrel  75 mg Per Tube Q breakfast  . collagenase   Topical Daily  . enoxaparin (LOVENOX) injection  40 mg Subcutaneous QHS  . feeding supplement (PROSource TF)  45 mL Per Tube TID  . folic acid  1 mg Per Tube Daily  . insulin aspart  0-6 Units Subcutaneous Q4H  . mirtazapine  15 mg Per Tube QHS   Continuous Infusions: . ertapenem 1,000 mg (05/14/20 2145)  . feeding supplement (OSMOLITE 1.5 CAL) 50 mL/hr at 05/13/20 0700  . vancomycin 750 mg (05/14/20 1326)     LOS: 6 days    Time spent:35 mins. More than 50% of that time was spent in counseling and/or coordination of care.      Shelly Coss, MD [facility] P3/22/2022, 7:32 AM

## 2020-05-16 ENCOUNTER — Inpatient Hospital Stay (HOSPITAL_COMMUNITY): Payer: Medicare Other

## 2020-05-16 DIAGNOSIS — E1169 Type 2 diabetes mellitus with other specified complication: Secondary | ICD-10-CM | POA: Diagnosis not present

## 2020-05-16 DIAGNOSIS — R601 Generalized edema: Secondary | ICD-10-CM | POA: Diagnosis not present

## 2020-05-16 DIAGNOSIS — T83511A Infection and inflammatory reaction due to indwelling urethral catheter, initial encounter: Secondary | ICD-10-CM | POA: Diagnosis not present

## 2020-05-16 DIAGNOSIS — L89613 Pressure ulcer of right heel, stage 3: Secondary | ICD-10-CM | POA: Diagnosis not present

## 2020-05-16 DIAGNOSIS — I10 Essential (primary) hypertension: Secondary | ICD-10-CM | POA: Diagnosis not present

## 2020-05-16 DIAGNOSIS — E8809 Other disorders of plasma-protein metabolism, not elsewhere classified: Secondary | ICD-10-CM

## 2020-05-16 DIAGNOSIS — G928 Other toxic encephalopathy: Secondary | ICD-10-CM | POA: Diagnosis not present

## 2020-05-16 DIAGNOSIS — I130 Hypertensive heart and chronic kidney disease with heart failure and stage 1 through stage 4 chronic kidney disease, or unspecified chronic kidney disease: Secondary | ICD-10-CM | POA: Diagnosis not present

## 2020-05-16 HISTORY — PX: IR REMOVAL TUN CV CATH W/O FL: IMG2289

## 2020-05-16 LAB — CBC WITH DIFFERENTIAL/PLATELET
Abs Immature Granulocytes: 0.07 10*3/uL (ref 0.00–0.07)
Basophils Absolute: 0 10*3/uL (ref 0.0–0.1)
Basophils Relative: 0 %
Eosinophils Absolute: 0.3 10*3/uL (ref 0.0–0.5)
Eosinophils Relative: 2 %
HCT: 17.3 % — ABNORMAL LOW (ref 36.0–46.0)
Hemoglobin: 5.6 g/dL — CL (ref 12.0–15.0)
Immature Granulocytes: 1 %
Lymphocytes Relative: 14 %
Lymphs Abs: 1.7 10*3/uL (ref 0.7–4.0)
MCH: 30.9 pg (ref 26.0–34.0)
MCHC: 32.4 g/dL (ref 30.0–36.0)
MCV: 95.6 fL (ref 80.0–100.0)
Monocytes Absolute: 1 10*3/uL (ref 0.1–1.0)
Monocytes Relative: 8 %
Neutro Abs: 8.9 10*3/uL — ABNORMAL HIGH (ref 1.7–7.7)
Neutrophils Relative %: 75 %
Platelets: 171 10*3/uL (ref 150–400)
RBC: 1.81 MIL/uL — ABNORMAL LOW (ref 3.87–5.11)
RDW: 17.8 % — ABNORMAL HIGH (ref 11.5–15.5)
WBC: 12 10*3/uL — ABNORMAL HIGH (ref 4.0–10.5)
nRBC: 0.4 % — ABNORMAL HIGH (ref 0.0–0.2)

## 2020-05-16 LAB — GLUCOSE, CAPILLARY
Glucose-Capillary: 125 mg/dL — ABNORMAL HIGH (ref 70–99)
Glucose-Capillary: 159 mg/dL — ABNORMAL HIGH (ref 70–99)
Glucose-Capillary: 152 mg/dL — ABNORMAL HIGH (ref 70–99)
Glucose-Capillary: 133 mg/dL — ABNORMAL HIGH (ref 70–99)
Glucose-Capillary: 146 mg/dL — ABNORMAL HIGH (ref 70–99)
Glucose-Capillary: 155 mg/dL — ABNORMAL HIGH (ref 70–99)

## 2020-05-16 LAB — C-REACTIVE PROTEIN: CRP: 2.1 mg/dL — ABNORMAL HIGH (ref <1.0)

## 2020-05-16 MED ORDER — ASPIRIN EC 81 MG PO TBEC
81.0000 mg | DELAYED_RELEASE_TABLET | Freq: Every day | ORAL | Status: DC
  Administered 2020-05-16 – 2020-05-17 (×2): 81 mg via ORAL
  Filled 2020-05-16 (×2): qty 1

## 2020-05-16 MED ORDER — LIDOCAINE HCL 1 % IJ SOLN
INTRAMUSCULAR | Status: AC
  Filled 2020-05-16: qty 20

## 2020-05-16 MED ORDER — LIDOCAINE HCL (PF) 1 % IJ SOLN
INTRAMUSCULAR | Status: DC | PRN
  Administered 2020-05-16: 10 mL

## 2020-05-16 MED ORDER — CLOPIDOGREL BISULFATE 75 MG PO TABS
75.0000 mg | ORAL_TABLET | Freq: Every day | ORAL | Status: DC
  Administered 2020-05-17: 75 mg via ORAL
  Filled 2020-05-16: qty 1

## 2020-05-16 MED ORDER — CHLORHEXIDINE GLUCONATE 4 % EX LIQD
CUTANEOUS | Status: AC
  Filled 2020-05-16: qty 15

## 2020-05-16 NOTE — Progress Notes (Signed)
Date and time results received: 05/16/20  At 7:40  Test: hemoglobin Critical Value: 5.6  Name of Provider Notified: Shelly Coss  MD aware, ordered received ( pallative care consult)  Lavenia Atlas, RN

## 2020-05-16 NOTE — Progress Notes (Signed)
PROGRESS NOTE    [name]  MBT:[id number] DOB: 1945/10/28 DOA: 05/09/2020 PCP: Hendricks Limes, MD   Chief Complain: Altered mental,hypoglycemia  Brief Narrative: [name] a 75 y.o.femalewith medical history significant of breast CA in 1990's  s/p mastectomy, chemotherapy, CVA, AS s/p AVR in 2006, then TAVR in 2021, HFpEF + CHB + PPM (placed 01/2019)-bacteremia group B strep 06/2019 status post PPM explantation and then subsequent Medtronic Micra, h/o right lower extremity ischemia status post angiography in 12/2019, h/o insulin dependent diabetes .She was admitted from  2/8 - 2/16 with critical ischemia of R leg and open ulcer of heel growing Morganella morganii on culture. Other issues included Sacral decubitus ulcer, and urinary retention.Patient was discharged on Invanz through March 25th She has FTT ,has been at nursing home for about a year, family noticed progressive decline for about 42-month, with poor oral intake, weight loss of 30 to 40 pounds, has been bedbound, needing to be fed. She was brought to the ED from nursing home this time  due to altered mental status, found to have hypoglycemia, blood glucose 29, hypothermia and hypotension.  She was admitted for further management of acute metabolic encephalopathy possible sepsis from sacral decubitus ulcer/heel ulcer.  Currently on broad-spectrum antibiotics.  ID following.  Palliative care  also consulted for goals of care.  Remains full code Due to worsening status of the patient, I have reconsulted palliative care.  Assessment & Plan:   Principal Problem:   Toxic metabolic encephalopathy Active Problems:   DM2 (diabetes mellitus, type 2) (HCC)   Hypertension   Hypoglycemia   Benign hypertensive heart and kidney disease with diastolic CHF, NYHA class 3 and CKD stage 3 (HCC)   Sacral decubitus ulcer, stage IV (HCC)   Sepsis (HCC)   Catheter-associated urinary tract infection (HCC)   Hypothermia   Decubitus  ulcer of right heel, stage 3 (HCC)   Anasarca   Protein-calorie malnutrition, severe   Hypoalbuminemia   Acute metabolic encephalopathy: Present on admission.  ABG done 05/13/2020 did not show any hypercarbia.  Monitor mental status.    Suspected sepsis: Presented with hypothermia, hypotension, leukocytosis, lactic acidosis.  Source of infection could be sacral decubitus ulcer/heel ulcer.  Right foot MRI showed  prominent ulceration along the posterior heel with bands of enhancement within the marrow,no overt bony destruction,suspicious for early osteomyelitis,subcutaneous edema and low-grade enhancement medial and lateral to the ankle potentially reflecting cellulitis. Indwelling tunneled catheter looks clean.  Blood cultures have not shown any growth.  Urine culture showed yeast.  Indwelling Foley catheter change in the emergency department.  She was started on broad-spectrum antibiotics.  ID consulted and following.  Currently on Invanz Spiked fever on 3/20 with worsening WBC count worsening mental status.  Chest x-ray did not show  any pneumonia. She has fever of 101.8 today.  Since she also developed leukocytosis, ID  ordered MRI of the lumbar spine to rule out deep abscess but it was found to be negative  Acute on chronic normocytic anemia/thrombocytopenia: Hemoglobin dropped to 5.6 today.  Patient is Jehovah's Witness ,doesnot accept any blood products.  Continue to monitor hemoglobin.  No evidence of active bleeding. Iron studies showed normal iron  Hypoglycemia/history of type 2 diabetes: On presentation, she was hypoglycemic with blood sugar of 29.  She was initially given D10 infusion.  Also started on tube feeds.  Currently on sliding scale.  Blood sugar stable  History of hypertension: Presented with hypotension.  Blood pressure has  normalized.  Monitor.  Antihypertensives on hold.  Hypothermia: Resolved. Now is febrile  CKD stage IIIa: Currently kidney function at baseline.   Monitor.  Avoid nephrotoxins.  History of peripheral vascular disease: She has history of right lower extremity  Critical ischemia status post angiography on 11/21.  Vascular surgery has recommended aspirin, Plavix indefinitely.  statin, being given by tube.  History of hyperlipidemia: On atorvastatin.  History of CVA:H/o AS s/p AVR in 2006, then TAVR in 2021,  H/o HFpEF + CHB + PPM  H/o bacteremia group B strep 06/2019 status post PPM explantation and then subsequent Medtronic Micra placement On aspirin,Plavix/statin   History of breast cancer: Status post left mastectomy, chemotherapy.  Now in remission.  Severe protein calorie malnutrition/poor oral intake: Due to immobility, bedbound status.  History of 40 pound weight loss in the last 4 months.  Albumin less than 1.  Nutrition following.  Currently on tube feeding diet.  I do not think she is a candidate for PEG  Severe electrolyte abnormalities: Currently stable.  Most likely from poor oral intake.  Continue to monitor.  She had low phosphorus from refeeding syndrome,now normalised.  Goals of care: Elderly patient with multiple comorbidities.  Palliative care  consulted and following.  Remains full code.  Extensive discussion have been held about goals of care multiple times focusing on her poor prognosis, multiple comorbidities.  Family opting for aggressive treatment. We will reconsult palliaitive care  Pressure Injury 05/10/20 Sacrum Stage 4 - Full thickness tissue loss with exposed bone, tendon or muscle. (Active)  05/10/20   Location: Sacrum  Location Orientation:   Staging: Stage 4 - Full thickness tissue loss with exposed bone, tendon or muscle.  Wound Description (Comments):   Present on Admission: Yes     Pressure Injury 05/10/20 Heel Right Stage 4 - Full thickness tissue loss with exposed bone, tendon or muscle. (Active)  05/10/20   Location: Heel  Location Orientation: Right  Staging: Stage 4 - Full thickness tissue  loss with exposed bone, tendon or muscle.  Wound Description (Comments):   Present on Admission: Yes            Nutrition Problem: Severe Malnutrition Etiology: chronic illness (poor mental status/PVD with chronic wounds)      DVT prophylaxis:lovenox Code Status: Full Family Communication: Discussed with Daughter Rechel daily Status is: Inpatient  Remains inpatient appropriate because:Hemodynamically unstable   Dispo: The patient is from: SNF              Anticipated d/c is to:Not sure              Patient currently is not medically stable to d/c.   Difficult to place patient No      Consultants: ID  Procedures:None  Antimicrobials:  Anti-infectives (From admission, onward)   Start     Dose/Rate Route Frequency Ordered Stop   05/14/20 1300  vancomycin (VANCOREADY) IVPB 750 mg/150 mL  Status:  Discontinued        750 mg 150 mL/hr over 60 Minutes Intravenous Every 24 hours 05/13/20 1415 05/15/20 1015   05/13/20 1345  fluconazole (DIFLUCAN) tablet 100 mg  Status:  Discontinued        100 mg Per Tube Daily 05/13/20 1251 05/13/20 1358   05/13/20 1315  vancomycin (VANCOREADY) IVPB 1250 mg/250 mL        1,250 mg 166.7 mL/hr over 90 Minutes Intravenous  Once 05/13/20 1233 05/13/20 1454   05/11/20 2200  ertapenem (INVANZ) 1,000  mg in sodium chloride 0.9 % 100 mL IVPB        1 g 200 mL/hr over 30 Minutes Intravenous Every 24 hours 05/11/20 0959     05/10/20 2100  vancomycin (VANCOREADY) IVPB 1000 mg/200 mL  Status:  Discontinued        1,000 mg 200 mL/hr over 60 Minutes Intravenous Every 24 hours 05/09/20 2040 05/11/20 1124   05/10/20 0800  ceFEPIme (MAXIPIME) 2 g in sodium chloride 0.9 % 100 mL IVPB  Status:  Discontinued        2 g 200 mL/hr over 30 Minutes Intravenous Every 12 hours 05/09/20 2040 05/09/20 2146   05/09/20 2300  meropenem (MERREM) 2 g in sodium chloride 0.9 % 100 mL IVPB        2 g 200 mL/hr over 30 Minutes Intravenous Every 12 hours 05/09/20 2146  05/11/20 1034   05/09/20 2000  vancomycin (VANCOREADY) IVPB 1000 mg/200 mL        1,000 mg 200 mL/hr over 60 Minutes Intravenous  Once 05/09/20 1908 05/09/20 2157   05/09/20 1915  ceFEPIme (MAXIPIME) 2 g in sodium chloride 0.9 % 100 mL IVPB        2 g 200 mL/hr over 30 Minutes Intravenous  Once 05/09/20 1908 05/09/20 1959   05/09/20 1915  metroNIDAZOLE (FLAGYL) IVPB 500 mg        500 mg 100 mL/hr over 60 Minutes Intravenous  Once 05/09/20 1908 05/09/20 2056      Subjective: Patient seen and examined the bedside this morning.  Hemodynamically stable during my evaluation.  She looks lethargic, weak.  Open her eyes on calling her name.  Confused.  She became febrile today    Objective: Vitals:   05/16/20 0400 05/16/20 0500 05/16/20 0737 05/16/20 0745  BP: (!) 120/52  (!) 116/51 (!) 122/54  Pulse: (!) 39 84 77 78  Resp: $Remo'20 20 20 20  'gASVQ$ Temp: (!) 100.4 F (38 C)  (!) 100.5 F (38.1 C) (!) 100.5 F (38.1 C)  TempSrc: Oral  Oral Oral  SpO2: 100% 97% 98% 100%  Weight:  87.1 kg    Height:        Intake/Output Summary (Last 24 hours) at 05/16/2020 1655 Last data filed at 05/15/2020 1341 Gross per 24 hour  Intake --  Output 300 ml  Net -300 ml   Filed Weights   05/12/20 0631 05/14/20 0437 05/16/20 0500  Weight: 62.1 kg 62.1 kg 87.1 kg    Examination:  General exam: Chronically ill looking, lethargic HEENT: Feeding tube Respiratory system: Bilateral  diminished breath sounds, no wheezes or crackles  Cardiovascular system: S1 & S2 heard, RRR. Gastrointestinal system: Abdomen is nondistended, soft and nontender. No organomegaly or masses felt. Normal bowel sounds heard. Central nervous system: Not alert or oriented Extremities: No edema, no clubbing ,no cyanosis Skin: Right heel ulcer, sacral ulcers.     Data Reviewed: I have personally reviewed following labs and imaging studies  CBC: Recent Labs  Lab 05/11/20 0500 05/12/20 0431 05/13/20 0058 05/14/20 0500  05/15/20 0500 05/16/20 0500  WBC 11.8* 11.3* 14.9* 15.6* 12.4* 12.0*  NEUTROABS 8.1*  --  11.5* 11.5* 8.5* 8.9*  HGB 7.0* 6.9* 7.4* 6.4* 6.2* 5.6*  HCT 20.8* 20.3* 22.0* 19.6* 18.4* 17.3*  MCV 92.9 93.5 92.8 95.1 94.8 95.6  PLT 160 152 162 148* 158 374   Basic Metabolic Panel: Recent Labs  Lab 05/11/20 0500 05/12/20 0431 05/13/20 0058 05/13/20 2128 05/14/20 0500 05/15/20 0500  NA 137 133* 138  --  140 142  K 4.0 3.1* 5.0  --  4.7 5.0  CL 108 105 110  --  110 111  CO2 $Re'22 23 24  'VcM$ --  24 25  GLUCOSE 62* 387* 209*  --  217* 194*  BUN $Re'13 11 16  'dJZ$ --  22 31*  CREATININE 1.21* 1.16* 1.19*  --  1.34* 1.25*  CALCIUM 7.2* 6.8* 7.0*  --  7.0* 7.4*  MG 1.6* 1.8  --   --  2.0  --   PHOS  --  1.8* 1.3* 4.4 4.1 3.6   GFR: Estimated Creatinine Clearance: 42.6 mL/min (A) (by C-G formula based on SCr of 1.25 mg/dL (H)). Liver Function Tests: Recent Labs  Lab 05/09/20 1906 05/11/20 0500 05/14/20 0500  AST 43* 31 27  ALT $Re'24 22 18  'nEJ$ ALKPHOS 168* 127* 232*  BILITOT 0.8 1.0 0.6  PROT 5.6* 4.1* 4.5*  ALBUMIN 1.2* <1.0* <1.0*   No results for input(s): LIPASE, AMYLASE in the last 168 hours. No results for input(s): AMMONIA in the last 168 hours. Coagulation Profile: Recent Labs  Lab 05/09/20 1906  INR 1.2   Cardiac Enzymes: No results for input(s): CKTOTAL, CKMB, CKMBINDEX, TROPONINI in the last 168 hours. BNP (last 3 results) Recent Labs    08/11/19 1638 08/23/19 1701  PROBNP 2,473* 3,638*   HbA1C: No results for input(s): HGBA1C in the last 72 hours. CBG: Recent Labs  Lab 05/15/20 1658 05/15/20 1950 05/15/20 2333 05/16/20 0435 05/16/20 0744  GLUCAP 90 122* 127* 125* 159*   Lipid Profile: No results for input(s): CHOL, HDL, LDLCALC, TRIG, CHOLHDL, LDLDIRECT in the last 72 hours. Thyroid Function Tests: No results for input(s): TSH, T4TOTAL, FREET4, T3FREE, THYROIDAB in the last 72 hours. Anemia Panel: Recent Labs    05/14/20 0909  FERRITIN 566*  TIBC NOT  CALCULATED  IRON 43   Sepsis Labs: Recent Labs  Lab 05/09/20 2132 05/11/20 0500 05/13/20 1302 05/13/20 1623  LATICACIDVEN 2.3* 1.4 1.9 1.2    Recent Results (from the past 240 hour(s))  Blood Culture (routine x 2)     Status: None   Collection Time: 05/09/20  7:25 PM   Specimen: BLOOD  Result Value Ref Range Status   Specimen Description BLOOD SITE NOT SPECIFIED  Final   Special Requests   Final    BOTTLES DRAWN AEROBIC AND ANAEROBIC Blood Culture adequate volume   Culture   Final    NO GROWTH 5 DAYS Performed at [facility], [address], [place], [place] [address]    Report Status 05/15/2020 FINAL  Final  Resp Panel by RT-PCR (Flu A&B, Covid) Nasopharyngeal Swab     Status: None   Collection Time: 05/09/20  7:30 PM   Specimen: Nasopharyngeal Swab; Nasopharyngeal(NP) swabs in vial transport medium  Result Value Ref Range Status   SARS Coronavirus 2 by RT PCR NEGATIVE NEGATIVE Final    Comment: (NOTE) SARS-CoV-2 target nucleic acids are NOT DETECTED.  The SARS-CoV-2 RNA is generally detectable in upper respiratory specimens during the acute phase of infection. The lowest concentration of SARS-CoV-2 viral copies this assay can detect is 138 copies/mL. A negative result does not preclude SARS-Cov-2 infection and should not be used as the sole basis for treatment or other patient management decisions. A negative result may occur with  improper specimen collection/handling, submission of specimen other than nasopharyngeal swab, presence of viral mutation(s) within the areas targeted by this assay, and inadequate number  of viral copies(<138 copies/mL). A negative result must be combined with clinical observations, patient history, and epidemiological information. The expected result is Negative.  Fact Sheet for Patients:  EntrepreneurPulse.com.au  Fact Sheet for Healthcare Providers:  IncredibleEmployment.be  This test is no  t yet approved or cleared by the [place] FDA and  has been authorized for detection and/or diagnosis of SARS-CoV-2 by FDA under an Emergency Use Authorization (EUA). This EUA will remain  in effect (meaning this test can be used) for the duration of the COVID-19 declaration under Section 564(b)(1) of the Act, 21 U.S.C.section 360bbb-3(b)(1), unless the authorization is terminated  or revoked sooner.       Influenza A by PCR NEGATIVE NEGATIVE Final   Influenza B by PCR NEGATIVE NEGATIVE Final    Comment: (NOTE) The Xpert Xpress SARS-CoV-2/FLU/RSV plus assay is intended as an aid in the diagnosis of influenza from Nasopharyngeal swab specimens and should not be used as a sole basis for treatment. Nasal washings and aspirates are unacceptable for Xpert Xpress SARS-CoV-2/FLU/RSV testing.  Fact Sheet for Patients: EntrepreneurPulse.com.au  Fact Sheet for Healthcare Providers: IncredibleEmployment.be  This test is not yet approved or cleared by the [place] FDA and has been authorized for detection and/or diagnosis of SARS-CoV-2 by FDA under an Emergency Use Authorization (EUA). This EUA will remain in effect (meaning this test can be used) for the duration of the COVID-19 declaration under Section 564(b)(1) of the Act, 21 U.S.C. section 360bbb-3(b)(1), unless the authorization is terminated or revoked.  Performed at [facility], [place] [address] [place], [place] [address]   Urine culture     Status: Abnormal   Collection Time: 05/11/20 10:10 AM   Specimen: In/Out Cath Urine  Result Value Ref Range Status   Specimen Description IN/OUT CATH URINE  Final   Special Requests   Final    NONE Performed at [facility], [place] [address] [place], [place] [address]    Culture 8,000 COLONIES/mL YEAST (A)  Final   Report Status 05/12/2020 FINAL  Final  Culture, blood (routine x 2)     Status: None (Preliminary result)    Collection Time: 05/13/20  1:12 PM   Specimen: BLOOD  Result Value Ref Range Status   Specimen Description BLOOD RIGHT ANTECUBITAL  Final   Special Requests   Final    BOTTLES DRAWN AEROBIC ONLY Blood Culture results may not be optimal due to an inadequate volume of blood received in culture bottles   Culture   Final    NO GROWTH 3 DAYS Performed at [facility], [place] [address] [place], [place] [address]    Report Status PENDING  Incomplete  Culture, blood (routine x 2)     Status: None (Preliminary result)   Collection Time: 05/13/20  1:29 PM   Specimen: BLOOD RIGHT HAND  Result Value Ref Range Status   Specimen Description BLOOD RIGHT HAND  Final   Special Requests   Final    BOTTLES DRAWN AEROBIC ONLY Blood Culture results may not be optimal due to an inadequate volume of blood received in culture bottles   Culture   Final    NO GROWTH 3 DAYS Performed at [facility], [place] [address] [place], [place] [address]    Report Status PENDING  Incomplete         Radiology Studies: MR Lumbar Spine W Wo Contrast  Result Date: 05/15/2020 CLINICAL DATA:  Epidural abscess. EXAM: MRI LUMBAR  SPINE WITHOUT AND WITH CONTRAST TECHNIQUE: Multiplanar and multiecho pulse sequences of the lumbar spine were obtained without and with intravenous contrast. CONTRAST:  21mL GADAVIST GADOBUTROL 1 MMOL/ML IV SOLN COMPARISON:  None. FINDINGS: Segmentation:  Standard. Alignment:  Physiologic. Vertebrae:  No fracture, evidence of discitis, or bone lesion. Conus medullaris and cauda equina: Conus extends to the L1 level. Conus and cauda equina appear normal. Paraspinal and other soft tissues: Diffuse edema of the posterior subcutaneous with soft tissue thickening in the sacrococcygeal region. This is only partially seen on sagittal views. Disc levels: T12-L1: No spinal canal or neural foraminal stenosis. L1-2: No spinal canal or neural foraminal stenosis. L2-3: No spinal canal or neural foraminal  stenosis. L3-4: Shallow disc bulge and mild facet degenerative changes without significant spinal canal or neural foraminal stenosis. L4-5: Loss of disc height, disc bulge, mild facet degenerative change ligamentum flavum redundancy resulting in mild spinal canal stenosis with narrowing of the bilateral subarticular zones and moderate bilateral neural foraminal narrowing. L4-5: Loss of disc height, narrowing of the right subarticular zone and mild right neural foraminal narrowing. IMPRESSION: 1. No evidence of and epidural abscess. 2. Soft tissue thickening in the sacrococcygeal region, which is only partially seen on sagittal views. 3. Mild degenerative changes at L4-5 and L4-5 with mild spinal canal stenosis with narrowing of the bilateral subarticular zones and moderate bilateral neural foraminal narrowing at L4-5. Electronically Signed   By: Pedro Earls M.D.   On: 05/15/2020 16:28   DG CHEST PORT 1 VIEW  Result Date: 05/14/2020 CLINICAL DATA:  Shortness of breath, altered mental status, history hypertension, CHF, stroke, coronary artery disease, diabetes mellitus EXAM: PORTABLE CHEST 1 VIEW COMPARISON:  Portable exam 1840 hours compared to 05/13/2020 FINDINGS: RIGHT jugular line with tip projecting over cavoatrial junction. Normal heart size post TAVR and CABG. Atherosclerotic calcification aorta. Mediastinal contours and pulmonary vascularity normal. Lungs clear. No pulmonary infiltrate, pleural effusion or pneumothorax. Bones demineralized. IMPRESSION: No acute abnormalities. Electronically Signed   By: Lavonia Dana M.D.   On: 05/14/2020 20:42        Scheduled Meds: . atorvastatin  10 mg Per Tube QHS  . Chlorhexidine Gluconate Cloth  6 each Topical Daily  . clopidogrel  75 mg Per Tube Q breakfast  . collagenase   Topical Daily  . enoxaparin (LOVENOX) injection  40 mg Subcutaneous QHS  . feeding supplement (PROSource TF)  45 mL Per Tube TID  . folic acid  1 mg Per Tube Daily   . insulin aspart  0-6 Units Subcutaneous Q4H  . mirtazapine  15 mg Per Tube QHS   Continuous Infusions: . ertapenem 1,000 mg (05/15/20 2120)  . feeding supplement (OSMOLITE 1.5 CAL) 1,000 mL (05/15/20 1704)     LOS: 7 days    Time spent:35 mins. More than 50% of that time was spent in counseling and/or coordination of care.      Shelly Coss, MD [facility] P3/23/2022, 8:07 AM

## 2020-05-16 NOTE — TOC Initial Note (Signed)
Transition of Care [facility]) - Initial/Assessment Note    Patient Details  Name: [name] MRN: [id number] Date of Birth: 11-11-1945  [facility] CM/SW Contact:    Vinie Sill, [place] Phone Number: 05/16/2020, 4:28 PM  Clinical Narrative:                  CSW visit with patient and her daughter,Denise. CSW introduced self and informed assigned CSW for the unit.  Family states no questions or concerns at this time.   Thurmond Butts, MSW, LCSW Clinical Social Worker    Barriers to Discharge: Continued Medical Work up   Patient Goals and CMS Choice        Expected Discharge Plan and Services         Living arrangements for the past 2 months: [place]                                      Prior Living Arrangements/Services Living arrangements for the past 2 months: [place] Lives with:: Facility Resident          Need for Family Participation in Patient Care: Yes (Comment) Care giver support system in place?: Yes (comment)      Activities of Daily Living      Permission Sought/Granted                  Emotional Assessment       Orientation: : Oriented to Self,Oriented to Place Alcohol / Substance Use: Not Applicable Psych Involvement: No (comment)  Admission diagnosis:  Hypocalcemia [E83.51] Anasarca [R60.1] Hypoalbuminemia [E88.09] Hypoglycemia [P23.3] Toxic metabolic encephalopathy [A07.6] Hypothermia, initial encounter [T68.XXXA] Sepsis ([place]) [A41.9] Urinary tract infection associated with indwelling urethral catheter, initial encounter ([place]) [A26.333L, N39.0] Patient Active Problem List   Diagnosis Date Noted  . Hypoalbuminemia   . Protein-calorie malnutrition, severe 05/11/2020  . Anasarca   . Catheter-associated urinary tract infection ([place]) 05/09/2020  . Toxic metabolic encephalopathy 45/62/5638  . Hypothermia 05/09/2020  . Decubitus ulcer of right heel, stage 3 ([place])  05/09/2020  . Open wound of heel 04/03/2020  . Bleeding 01/27/2020  . CKD (chronic kidney disease) stage 3, GFR 30-59 ml/min (HCC) 01/27/2020  . Critical lower limb ischemia ([place]) 01/04/2020  . Neurocognitive deficits 08/04/2019  . Controlled type 2 diabetes mellitus without complication ([place]) 93/73/4287  . CVA (cerebral vascular accident) ([place]) 07/23/2019  . Fever   . History of transcatheter aortic valve replacement (TAVR)   . Sacral decubitus ulcer, stage IV ([place]) 07/18/2019  . Sepsis ([place]) 07/18/2019  . Complete heart block ([place])   . Chronic diastolic congestive heart failure ([place]) 02/07/2019  . AKI (acute kidney injury) ([place]) 07/22/2017  . Hypoglycemia 07/21/2017  . Peripheral arterial disease ([place]) 06/18/2017  . B12 deficiency 05/01/2017  . Bilateral lower extremity edema 11/08/2015  . Aortic atherosclerosis ([place]) 06/07/2014  . Abnormality of gait 05/29/2012  . Constipation 03/16/2012  . HOCM (hypertrophic obstructive cardiomyopathy) ([place]) 06/11/2011  . Routine health maintenance 06/10/2010  . Hyperlipidemia   . Refusal of blood transfusions as patient is Jehovah's Witness   . History of adenocarcinoma of breast   . Osteoporosis   . Status post CVA   . Aortic stenosis s/p Tissure AVR 2006   . CAD (coronary artery disease)   . Hypertension   . Depression 01/23/2006  . DM2 (diabetes mellitus, type 2) ([place]) 03/25/1990  .  Benign hypertensive heart and kidney disease with diastolic CHF, NYHA class 3 and CKD stage 3 ([place]) 1992  . Bradycardia 1992   PCP:  Hendricks Limes, MD Pharmacy:   [facility], [place] [address] Phone: [phone] Fax: [phone]     Social Determinants of Health (SDOH) Interventions    Readmission Risk Interventions No flowsheet data found.

## 2020-05-16 NOTE — Progress Notes (Addendum)
Nutrition Follow-up  DOCUMENTATION CODES:  Severe malnutrition in context of chronic illness  INTERVENTION:  Continue current TF regimen: -Osmolite 1.5 @ 50 ml/hr (1200 ml) via Cortrak -ProSource TF 45 ml TID Provides: 1960 kcals, 114 grams protein, 914 ml free water.   NUTRITION DIAGNOSIS:  Severe Malnutrition related to chronic illness (poor mental status/PVD with chronic wounds) as evidenced by energy intake < or equal to 75% for > or equal to 1 month,moderate fat depletion,severe muscle depletion. - ongoing  GOAL:  Patient will meet greater than or equal to 90% of their needs - met with TF  MONITOR:  Diet advancement,TF tolerance  REASON FOR ASSESSMENT:  Consult Enteral/tube feeding initiation and management  ASSESSMENT:  Patient with PMH significant for CVA, AS s/p AVR in 2006, s/p TAVR in 2021, PPM 2020, CAD, CHF,PVD, and CKD III. Presents this admission with acute encephalopathy 2/2 to hypoglycemia.  Tube feeding:  -Osmolite 1.5 @ 50 ml/hr (1200 ml) via Cortrak -ProSource TF 45 ml TID Provides: 1960 kcals, 114 grams protein, 914 ml free water.   Went to visit pt, pt very sleepy and unaware. Spoke with her nurse and nurse reports that she is tolerating her TF well.  Of note, pt cannot d/c with Cortrak tube. Need to consider long-term solution (I.e. PEG tube) for nutrition if patient is unable to eat PO.  Relevant Medications: folic acid 1 mcg, Remeron Labs: reviewed; CBG 62-387 HbA1c: 5.8% (03/2020)  Diet Order:   Diet Order    None     EDUCATION NEEDS:  Not appropriate for education at this time  Skin:  Skin Assessment: Skin Integrity Issues: Skin Integrity Issues:: Stage IV Stage IV: sacrum, R heel  Last BM:  05/16/20 - large, Type 7  Height:  Ht Readings from Last 1 Encounters:  05/09/20 '5\' 6"'  (1.676 m)   Weight:  Wt Readings from Last 1 Encounters:  05/16/20 87.1 kg   Ideal Body Weight:  59.1 kg  BMI:  Body mass index is 30.99  kg/m.  Estimated Nutritional Needs:  Kcal:  1750-1950 kcal Protein:  100-115 grams Fluid:  >/= 1.7 L/day  Derrel Nip, RD, LDN Registered Dietitian After Hours/Weekend Pager # in [place]

## 2020-05-16 NOTE — Progress Notes (Signed)
Subjective: Patient is more somnolent today   Antibiotics:  Anti-infectives (From admission, onward)   Start     Dose/Rate Route Frequency Ordered Stop   05/14/20 1300  vancomycin (VANCOREADY) IVPB 750 mg/150 mL  Status:  Discontinued        750 mg 150 mL/hr over 60 Minutes Intravenous Every 24 hours 05/13/20 1415 05/15/20 1015   05/13/20 1345  fluconazole (DIFLUCAN) tablet 100 mg  Status:  Discontinued        100 mg Per Tube Daily 05/13/20 1251 05/13/20 1358   05/13/20 1315  vancomycin (VANCOREADY) IVPB 1250 mg/250 mL        1,250 mg 166.7 mL/hr over 90 Minutes Intravenous  Once 05/13/20 1233 05/13/20 1454   05/11/20 2200  ertapenem (INVANZ) 1,000 mg in sodium chloride 0.9 % 100 mL IVPB        1 g 200 mL/hr over 30 Minutes Intravenous Every 24 hours 05/11/20 0959     05/10/20 2100  vancomycin (VANCOREADY) IVPB 1000 mg/200 mL  Status:  Discontinued        1,000 mg 200 mL/hr over 60 Minutes Intravenous Every 24 hours 05/09/20 2040 05/11/20 1124   05/10/20 0800  ceFEPIme (MAXIPIME) 2 g in sodium chloride 0.9 % 100 mL IVPB  Status:  Discontinued        2 g 200 mL/hr over 30 Minutes Intravenous Every 12 hours 05/09/20 2040 05/09/20 2146   05/09/20 2300  meropenem (MERREM) 2 g in sodium chloride 0.9 % 100 mL IVPB        2 g 200 mL/hr over 30 Minutes Intravenous Every 12 hours 05/09/20 2146 05/11/20 1034   05/09/20 2000  vancomycin (VANCOREADY) IVPB 1000 mg/200 mL        1,000 mg 200 mL/hr over 60 Minutes Intravenous  Once 05/09/20 1908 05/09/20 2157   05/09/20 1915  ceFEPIme (MAXIPIME) 2 g in sodium chloride 0.9 % 100 mL IVPB        2 g 200 mL/hr over 30 Minutes Intravenous  Once 05/09/20 1908 05/09/20 1959   05/09/20 1915  metroNIDAZOLE (FLAGYL) IVPB 500 mg        500 mg 100 mL/hr over 60 Minutes Intravenous  Once 05/09/20 1908 05/09/20 2056      Medications: Scheduled Meds: . aspirin EC  81 mg Oral Daily  . atorvastatin  10 mg Per Tube QHS  . Chlorhexidine  Gluconate Cloth  6 each Topical Daily  . clopidogrel  75 mg Oral Daily  . collagenase   Topical Daily  . feeding supplement (PROSource TF)  45 mL Per Tube TID  . folic acid  1 mg Per Tube Daily  . insulin aspart  0-6 Units Subcutaneous Q4H  . mirtazapine  15 mg Per Tube QHS   Continuous Infusions: . ertapenem 1,000 mg (05/15/20 2120)  . feeding supplement (OSMOLITE 1.5 CAL) 1,000 mL (05/16/20 1233)   PRN Meds:.acetaminophen **OR** acetaminophen, ipratropium-albuterol, ondansetron **OR** ondansetron (ZOFRAN) IV, oxyCODONE-acetaminophen    Objective: Weight change:   Intake/Output Summary (Last 24 hours) at 05/16/2020 1321 Last data filed at 05/16/2020 0800 Gross per 24 hour  Intake 3998 ml  Output 300 ml  Net 3698 ml   Blood pressure (!) 118/45, pulse 71, temperature 99.3 F (37.4 C), temperature source Oral, resp. rate 17, height $RemoveBe'5\' 6"'UrxhIwwbR$  (1.676 m), weight 87.1 kg, SpO2 97 %. Temp:  [99.3 F (37.4 C)-101.8 F (38.8 C)] 99.3 F (37.4 C) (03/23 1115) Pulse Rate:  [  39-84] 71 (03/23 1115) Resp:  [17-20] 17 (03/23 1115) BP: (104-122)/(41-74) 118/45 (03/23 1115) SpO2:  [97 %-100 %] 97 % (03/23 1115) Weight:  [87.1 kg] 87.1 kg (03/23 0500)  Physical Exam: Physical Exam Eyes:     Extraocular Movements: Extraocular movements intact.  Cardiovascular:     Rate and Rhythm: Normal rate.     Heart sounds: Murmur heard.  No gallop.   Pulmonary:     Effort: Pulmonary effort is normal. No respiratory distress.     Breath sounds: Normal breath sounds.  Abdominal:     General: Bowel sounds are normal. There is no distension.     Palpations: There is no mass.  Neurological:     Mental Status: She is alert.     Comments: Oriented to person and place  Psychiatric:        Cognition and Memory: Cognition is impaired. Memory is impaired.     Right foot 3/17:      "" 3/18:    Decubitus ulcer not examined today.  CBC:    BMET Recent Labs    05/14/20 0500 05/15/20 0500   NA 140 142  K 4.7 5.0  CL 110 111  CO2 24 25  GLUCOSE 217* 194*  BUN 22 31*  CREATININE 1.34* 1.25*  CALCIUM 7.0* 7.4*     Liver Panel  Recent Labs    05/14/20 0500  PROT 4.5*  ALBUMIN <1.0*  AST 27  ALT 18  ALKPHOS 232*  BILITOT 0.6       Sedimentation Rate Recent Labs    05/14/20 0500  ESRSEDRATE 45*   C-Reactive Protein Recent Labs    05/14/20 0500 05/16/20 0500  CRP 1.3* 2.1*    Micro Results: Recent Results (from the past 720 hour(s))  Blood Culture (routine x 2)     Status: None   Collection Time: 05/09/20  7:25 PM   Specimen: BLOOD  Result Value Ref Range Status   Specimen Description BLOOD SITE NOT SPECIFIED  Final   Special Requests   Final    BOTTLES DRAWN AEROBIC AND ANAEROBIC Blood Culture adequate volume   Culture   Final    NO GROWTH 5 DAYS Performed at [facility], [address] [place], [place] [address]    Report Status 05/15/2020 FINAL  Final  Resp Panel by RT-PCR (Flu A&B, Covid) Nasopharyngeal Swab     Status: None   Collection Time: 05/09/20  7:30 PM   Specimen: Nasopharyngeal Swab; Nasopharyngeal(NP) swabs in vial transport medium  Result Value Ref Range Status   SARS Coronavirus 2 by RT PCR NEGATIVE NEGATIVE Final    Comment: (NOTE) SARS-CoV-2 target nucleic acids are NOT DETECTED.  The SARS-CoV-2 RNA is generally detectable in upper respiratory specimens during the acute phase of infection. The lowest concentration of SARS-CoV-2 viral copies this assay can detect is 138 copies/mL. A negative result does not preclude SARS-Cov-2 infection and should not be used as the sole basis for treatment or other patient management decisions. A negative result may occur with  improper specimen collection/handling, submission of specimen other than nasopharyngeal swab, presence of viral mutation(s) within the areas targeted by this assay, and inadequate number of viral copies(<138 copies/mL). A negative result must be  combined with clinical observations, patient history, and epidemiological information. The expected result is Negative.  Fact Sheet for Patients:  EntrepreneurPulse.com.au  Fact Sheet for Healthcare Providers:  IncredibleEmployment.be  This test is no t yet approved or cleared by the  [place] Architectural technologist and  has been authorized for detection and/or diagnosis of SARS-CoV-2 by FDA under an Print production planner (EUA). This EUA will remain  in effect (meaning this test can be used) for the duration of the COVID-19 declaration under Section 564(b)(1) of the Act, 21 U.S.C.section 360bbb-3(b)(1), unless the authorization is terminated  or revoked sooner.       Influenza A by PCR NEGATIVE NEGATIVE Final   Influenza B by PCR NEGATIVE NEGATIVE Final    Comment: (NOTE) The Xpert Xpress SARS-CoV-2/FLU/RSV plus assay is intended as an aid in the diagnosis of influenza from Nasopharyngeal swab specimens and should not be used as a sole basis for treatment. Nasal washings and aspirates are unacceptable for Xpert Xpress SARS-CoV-2/FLU/RSV testing.  Fact Sheet for Patients: EntrepreneurPulse.com.au  Fact Sheet for Healthcare Providers: IncredibleEmployment.be  This test is not yet approved or cleared by the [place] FDA and has been authorized for detection and/or diagnosis of SARS-CoV-2 by FDA under an Emergency Use Authorization (EUA). This EUA will remain in effect (meaning this test can be used) for the duration of the COVID-19 declaration under Section 564(b)(1) of the Act, 21 U.S.C. section 360bbb-3(b)(1), unless the authorization is terminated or revoked.  Performed at [facility], [place] [address], [place], [place] [address]   Urine culture     Status: Abnormal   Collection Time: 05/11/20 10:10 AM   Specimen: In/Out Cath Urine  Result Value Ref Range Status   Specimen Description IN/OUT CATH  URINE  Final   Special Requests   Final    NONE Performed at [facility], [place] [address], [place], [place] [address]    Culture 8,000 COLONIES/mL YEAST (A)  Final   Report Status 05/12/2020 FINAL  Final  Culture, blood (routine x 2)     Status: None (Preliminary result)   Collection Time: 05/13/20  1:12 PM   Specimen: BLOOD  Result Value Ref Range Status   Specimen Description BLOOD RIGHT ANTECUBITAL  Final   Special Requests   Final    BOTTLES DRAWN AEROBIC ONLY Blood Culture results may not be optimal due to an inadequate volume of blood received in culture bottles   Culture   Final    NO GROWTH 3 DAYS Performed at [facility], [place] [address] [place], [place] [address]    Report Status PENDING  Incomplete  Culture, blood (routine x 2)     Status: None (Preliminary result)   Collection Time: 05/13/20  1:29 PM   Specimen: BLOOD RIGHT HAND  Result Value Ref Range Status   Specimen Description BLOOD RIGHT HAND  Final   Special Requests   Final    BOTTLES DRAWN AEROBIC ONLY Blood Culture results may not be optimal due to an inadequate volume of blood received in culture bottles   Culture   Final    NO GROWTH 3 DAYS Performed at [facility], [place] [address], [place], [place] [address]    Report Status PENDING  Incomplete    Studies/Results: MR Lumbar Spine W Wo Contrast  Result Date: 05/15/2020 CLINICAL DATA:  Epidural abscess. EXAM: MRI LUMBAR SPINE WITHOUT AND WITH CONTRAST TECHNIQUE: Multiplanar and multiecho pulse sequences of the lumbar spine were obtained without and with intravenous contrast. CONTRAST:  41mL GADAVIST GADOBUTROL 1 MMOL/ML IV SOLN COMPARISON:  None. FINDINGS: Segmentation:  Standard. Alignment:  Physiologic. Vertebrae:  No fracture, evidence of discitis, or bone lesion. Conus medullaris and cauda equina: Conus  extends to the L1 level. Conus and cauda equina appear normal. Paraspinal and other soft tissues: Diffuse edema of the posterior  subcutaneous with soft tissue thickening in the sacrococcygeal region. This is only partially seen on sagittal views. Disc levels: T12-L1: No spinal canal or neural foraminal stenosis. L1-2: No spinal canal or neural foraminal stenosis. L2-3: No spinal canal or neural foraminal stenosis. L3-4: Shallow disc bulge and mild facet degenerative changes without significant spinal canal or neural foraminal stenosis. L4-5: Loss of disc height, disc bulge, mild facet degenerative change ligamentum flavum redundancy resulting in mild spinal canal stenosis with narrowing of the bilateral subarticular zones and moderate bilateral neural foraminal narrowing. L4-5: Loss of disc height, narrowing of the right subarticular zone and mild right neural foraminal narrowing. IMPRESSION: 1. No evidence of and epidural abscess. 2. Soft tissue thickening in the sacrococcygeal region, which is only partially seen on sagittal views. 3. Mild degenerative changes at L4-5 and L4-5 with mild spinal canal stenosis with narrowing of the bilateral subarticular zones and moderate bilateral neural foraminal narrowing at L4-5. Electronically Signed   By: Pedro Earls M.D.   On: 05/15/2020 16:28   DG CHEST PORT 1 VIEW  Result Date: 05/14/2020 CLINICAL DATA:  Shortness of breath, altered mental status, history hypertension, CHF, stroke, coronary artery disease, diabetes mellitus EXAM: PORTABLE CHEST 1 VIEW COMPARISON:  Portable exam 1840 hours compared to 05/13/2020 FINDINGS: RIGHT jugular line with tip projecting over cavoatrial junction. Normal heart size post TAVR and CABG. Atherosclerotic calcification aorta. Mediastinal contours and pulmonary vascularity normal. Lungs clear. No pulmonary infiltrate, pleural effusion or pneumothorax. Bones demineralized. IMPRESSION: No acute abnormalities. Electronically Signed   By: Lavonia Dana M.D.   On: 05/14/2020 20:42      Assessment/Plan:  INTERVAL HISTORY: MRI L-spine is  unremarkable Principal Problem:   Toxic metabolic encephalopathy Active Problems:   DM2 (diabetes mellitus, type 2) (HCC)   Hypertension   Hypoglycemia   Benign hypertensive heart and kidney disease with diastolic CHF, NYHA class 3 and CKD stage 3 (HCC)   Sacral decubitus ulcer, stage IV (HCC)   Sepsis (HCC)   Catheter-associated urinary tract infection ([place])   Hypothermia   Decubitus ulcer of right heel, stage 3 (HCC)   Anasarca   Protein-calorie malnutrition, severe   Hypoalbuminemia    [name] is a 75 y.o. female with multiple medical problems and dementia with bedbound status and large sacral decubitus ulcer, history of pacemaker the became infected with group B streptococcus in the context of group B streptococcus bacteremia status post extraction of the pacemaker in 2021 with placement of new Medtronic Micra.  Patient also underwent a TAVR.  (Note I have discussed her case via secure chat with both Dr. Curt Bears as well as the patient's cardiology PA Lattie Corns and they have told me that both the pacemaker and the T AVR are MRI safe)  More recently she was admitted to the hospital in February worsening of her sacral wound along with a diabetic foot ulcer with evidence of osteomyelitis on imaging.  She underwent surgery at the podiatry who removed a piece of bone from her calcaneus and sent this to the lab in the path lab no evidence of osteomyelitis was seen under the microscope.  No cultures were done at that time.  She did have a prior culture from her wound that grew multiple organisms including Morganella.  Dr. Juleen China recommended a course of ertapenem which the patient was continuing on.  She was seen by Janene Madeira 2 days prior to admission and appeared to be doing well.  Her inflammatories had been trending.  In the interim however she experienced increasing confusion hypothermia and hypotension in the setting context of hypoglycemia.  She was  readmitted to the hospital blood cultures were taken urine cultures were taken and she was placed on broad-spectrum antibiotics with vancomycin and cefepime and then vancomycin and meropenem.  Attained an MRI of the foot last week which was reassuring and then narrowed her back to ertapenem.  Over the weekend she had a low-grade fever and blood cultures were collected again and vancomycin reinstituted.  For what is aware it is worth her white cell are trending upward again along with her inflammatory markers.  With blood cultures negative I am stopping her vancomycin as I do not know what we are treating with it at this point.  I obtained an MRI of her lumbosacral area and this did not show any evidence of deep infection  Her pacemaker will need to be reprogrammed again afterwards.   If we do not identify something that merits long-term systemic antibiotics would discontinue her central line.  Additionally the line itself could be a source of her fevers though I suspect her overall deconditioning and undoubted atelectasis would predispose her to recurrent fevers.   Failure to thrive: I do not see her improving long-term with her poor nutritional status and in bedbound.  She will likely have recurrent hospitalizations in the future unfortunately.  She really would benefit in my opinion from a shift to palliative care that were afford her more compassionate care than the aggressive treatments that she is currently receiving.      LOS: 7 days   Alcide Evener 05/16/2020, 1:21 PM

## 2020-05-16 NOTE — Procedures (Signed)
PROCEDURE SUMMARY:  Successful removal of right IJ tunneled CVC, removed intact. No immediate complications.  EBL <2 mL. Pressure dressing (gauze + tegaderm) applied to site. Patient tolerated well.   Discharge instructions: 1- Ok to shower 48 hours post-removal. 2- No submerging (swimming, bathing) for 7 days post-removal. 3- Ok to remove bandage after first shower, no further dressing changes needed- ensure area remains clean and dry until fully healed.  Please see imaging section of Epic for full dictation.   Earley Abide PA-C 05/16/2020 3:56 PM

## 2020-05-16 NOTE — Progress Notes (Signed)
PALLIATIVE NOTE:  Palliative Care consult requested for goals of care discussion in this 75 y.o. female with multiple medical problems including breast CA s/p mastectomy (90's) & chemotherapy, CVA, AS s/p AVR (2006), TAVR (2021), hypertension, CKD 3, CHF, CAD, right lower extremity ischemia status post angiography (12/2019), and insulin dependent diabetes. She presented to the ED from [facility] facility with complaints of altered mental status. Followed by wound care outpatient due to sacral and heel decubitus. Patient is a Restaurant manager, fast food. Patient's blood glucose 29 on EMS arrival. Patient was hypothermic (95.6) and hypotensive (80s) on arrival. Patient initially placed on Bair hugger, D10 drip, and IV antibiotics. UA positive for UTI. Since admission appetite remains poor and family has requested trial period of Coretrak for tube feeding.   Chart reviewed.  Updates received.  Patient is somnolent.  Will open eyes to verbal stimuli.  And able to answer questions appropriately.  No family present at the bedside.  I spoke with patient's daughter, [name] and provided updates.  She verbalized understanding expressing hopes that her mother which show improvement at some point.  We discussed patient with no significant improvement unfortunately with further decline.  Daughter is tearful expressing she needs to provide further updates to her siblings.  Support provided.  Created space and opportunity for daughter to readdress goals of care in the setting of decline despite interventions and time trial.  [name] is requesting to have further discussions in person and allow for her sisters to be involved.  Patient has a total of 8 daughters. [name] requesting to meet at her mother's bedside on tomorrow at 930 for further discussions and decision making.  She will let me know if other siblings are planning to attend to allow facilitation of appropriate space and approval.  Daughter is aware I will  meet her at her mother's bedside at requested time.  Discussed with daughter given patient's decline concerns for sudden emergent event in the setting of a full code.  Daughter verbalizes understanding and is requesting to continue with full code and medical interventions until further discussions on tomorrow.  All questions answered and support provided.  Assessment: Somnolent, will open eyes briefly to verbal stimuli.  Would not answer questions appropriately.  Generalized weakness.  Chronically ill-appearing, diminished bilaterally.  Plan: -Continue with current plan of care (full code/full scope) as requested by daughter until further goals of care discussion on tomorrow.  Daughter is aware of patient's declining status and high risk of further decompensation in setting cardiopulmonary event. -[name] is requesting goals of care meeting at bedside on 05/17/2020.  Requesting ability to allow her siblings to participate.  She will notify me with the names of her siblings that are planning to attend.  Offered to also involve patient's daughters via conference call if needed given she has a total of 8. -PMT will continue to support and follow.  Detailed note to follow goals of care meeting on tomorrow.  Time Total: 35 min.   Visit consisted of counseling and education dealing with the complex and emotionally intense issues of symptom management and palliative care in the setting of serious and potentially life-threatening illness.Greater than 50%  of this time was spent counseling and coordinating care related to the above assessment and plan.  Alda Lea, AGPCNP-BC  Palliative Medicine Team [phone]

## 2020-05-17 DIAGNOSIS — D649 Anemia, unspecified: Secondary | ICD-10-CM

## 2020-05-17 DIAGNOSIS — I10 Essential (primary) hypertension: Secondary | ICD-10-CM | POA: Diagnosis not present

## 2020-05-17 DIAGNOSIS — T83511A Infection and inflammatory reaction due to indwelling urethral catheter, initial encounter: Secondary | ICD-10-CM | POA: Diagnosis not present

## 2020-05-17 DIAGNOSIS — L89613 Pressure ulcer of right heel, stage 3: Secondary | ICD-10-CM | POA: Diagnosis not present

## 2020-05-17 DIAGNOSIS — Z66 Do not resuscitate: Secondary | ICD-10-CM

## 2020-05-17 DIAGNOSIS — E1169 Type 2 diabetes mellitus with other specified complication: Secondary | ICD-10-CM | POA: Diagnosis not present

## 2020-05-17 DIAGNOSIS — Z7189 Other specified counseling: Secondary | ICD-10-CM

## 2020-05-17 DIAGNOSIS — R601 Generalized edema: Secondary | ICD-10-CM | POA: Diagnosis not present

## 2020-05-17 DIAGNOSIS — G928 Other toxic encephalopathy: Secondary | ICD-10-CM | POA: Diagnosis not present

## 2020-05-17 DIAGNOSIS — I130 Hypertensive heart and chronic kidney disease with heart failure and stage 1 through stage 4 chronic kidney disease, or unspecified chronic kidney disease: Secondary | ICD-10-CM | POA: Diagnosis not present

## 2020-05-17 DIAGNOSIS — Z4659 Encounter for fitting and adjustment of other gastrointestinal appliance and device: Secondary | ICD-10-CM

## 2020-05-17 LAB — CBC WITH DIFFERENTIAL/PLATELET
Abs Immature Granulocytes: 0.07 10*3/uL (ref 0.00–0.07)
Basophils Absolute: 0 10*3/uL (ref 0.0–0.1)
Basophils Relative: 0 %
Eosinophils Absolute: 0.2 10*3/uL (ref 0.0–0.5)
Eosinophils Relative: 2 %
HCT: 17.6 % — ABNORMAL LOW (ref 36.0–46.0)
Hemoglobin: 5.6 g/dL — CL (ref 12.0–15.0)
Immature Granulocytes: 1 %
Lymphocytes Relative: 16 %
Lymphs Abs: 1.8 10*3/uL (ref 0.7–4.0)
MCH: 31.5 pg (ref 26.0–34.0)
MCHC: 31.8 g/dL (ref 30.0–36.0)
MCV: 98.9 fL (ref 80.0–100.0)
Monocytes Absolute: 1 10*3/uL (ref 0.1–1.0)
Monocytes Relative: 9 %
Neutro Abs: 8.3 10*3/uL — ABNORMAL HIGH (ref 1.7–7.7)
Neutrophils Relative %: 72 %
Platelets: 176 10*3/uL (ref 150–400)
RBC: 1.78 MIL/uL — ABNORMAL LOW (ref 3.87–5.11)
RDW: 18.6 % — ABNORMAL HIGH (ref 11.5–15.5)
WBC: 11.4 10*3/uL — ABNORMAL HIGH (ref 4.0–10.5)
nRBC: 0.4 % — ABNORMAL HIGH (ref 0.0–0.2)

## 2020-05-17 LAB — COMPREHENSIVE METABOLIC PANEL
ALT: 18 U/L (ref 0–44)
AST: 28 U/L (ref 15–41)
Albumin: 1.1 g/dL — ABNORMAL LOW (ref 3.5–5.0)
Alkaline Phosphatase: 179 U/L — ABNORMAL HIGH (ref 38–126)
Anion gap: 5 (ref 5–15)
BUN: 43 mg/dL — ABNORMAL HIGH (ref 8–23)
CO2: 21 mmol/L — ABNORMAL LOW (ref 22–32)
Calcium: 7.3 mg/dL — ABNORMAL LOW (ref 8.9–10.3)
Chloride: 117 mmol/L — ABNORMAL HIGH (ref 98–111)
Creatinine, Ser: 1.19 mg/dL — ABNORMAL HIGH (ref 0.44–1.00)
GFR, Estimated: 47 mL/min — ABNORMAL LOW (ref >60)
Glucose, Bld: 173 mg/dL — ABNORMAL HIGH (ref 70–99)
Potassium: 5.4 mmol/L — ABNORMAL HIGH (ref 3.5–5.1)
Sodium: 143 mmol/L (ref 135–145)
Total Bilirubin: 0.5 mg/dL (ref 0.3–1.2)
Total Protein: 4.6 g/dL — ABNORMAL LOW (ref 6.5–8.1)

## 2020-05-17 LAB — GLUCOSE, CAPILLARY
Glucose-Capillary: 153 mg/dL — ABNORMAL HIGH (ref 70–99)
Glucose-Capillary: 158 mg/dL — ABNORMAL HIGH (ref 70–99)
Glucose-Capillary: 161 mg/dL — ABNORMAL HIGH (ref 70–99)

## 2020-05-17 MED ORDER — POLYVINYL ALCOHOL 1.4 % OP SOLN
1.0000 [drp] | Freq: Four times a day (QID) | OPHTHALMIC | Status: DC | PRN
Start: 2020-05-17 — End: 2020-05-23
  Filled 2020-05-17: qty 15

## 2020-05-17 MED ORDER — HALOPERIDOL LACTATE 5 MG/ML IJ SOLN: 0.5000 mg | INTRAMUSCULAR | Status: DC | PRN

## 2020-05-17 MED ORDER — BIOTENE DRY MOUTH MT LIQD: 15.0000 mL | OROMUCOSAL | Status: DC | PRN

## 2020-05-17 MED ORDER — MORPHINE SULFATE (CONCENTRATE) 10 MG/0.5ML PO SOLN
5.0000 mg | ORAL | Status: DC | PRN
  Administered 2020-05-18: 5 mg via SUBLINGUAL
  Filled 2020-05-17: qty 0.5

## 2020-05-17 MED ORDER — MORPHINE SULFATE (PF) 2 MG/ML IV SOLN: 1.0000 mg | INTRAVENOUS | Status: DC | PRN

## 2020-05-17 MED ORDER — GLYCOPYRROLATE 0.2 MG/ML IJ SOLN: 0.2000 mg | INTRAMUSCULAR | Status: DC | PRN

## 2020-05-17 NOTE — Progress Notes (Signed)
Applied DNR bracelet on pt's R wrist. D/c tele and coretrax per order.   Lavenia Atlas, RN

## 2020-05-17 NOTE — Progress Notes (Signed)
Palliative Medicine RN Note: Sent work excuse letters to family members per request of PMT NP Nikki.  Marjie Skiff Naji Mehringer, RN, BSN, [facility] Palliative Medicine Team 05/17/2020 11:42 AM Office [phone]

## 2020-05-17 NOTE — TOC Progression Note (Signed)
Transition of Care (TOC) - Progression Note  Marvetta Gibbons RN, BSN Transitions of Care Unit 4E- RN Case Manager See Treatment Team for direct phone #    Patient Details  Name: [name] MRN: [id number] Date of Birth: [birth year]  Transition of Care [facility]) CM/SW Contact  Dahlia Client, Romeo Rabon, RN Phone Number: 05/17/2020, 4:26 PM  Clinical Narrative:    Noted referral for [place] request- referral has been called to [facility] - spoke with [employer]. [facility] to review for [place] eligibility and follow for bed availability- per Chrislyn there are no beds available today, will f/u tomorrow.   Expected Discharge Plan: [place] Barriers to Discharge: Continued Medical Work up  Expected Discharge Plan and Services Expected Discharge Plan: [place] Choice: Hospice Living arrangements for the past 2 months: [place]                                       Social Determinants of Health (SDOH) Interventions    Readmission Risk Interventions No flowsheet data found.

## 2020-05-17 NOTE — Progress Notes (Signed)
This chaplain is present for F/U spiritual care.  The Pt. is surrounded by prayer and family at time of visit.  The chaplain offered her presence as needed.

## 2020-05-17 NOTE — Progress Notes (Signed)
Manufacturing engineer [facility]) Hospital Liaison note.    Received request from [place] for family interest in [facility]. Chart and pt information under review by [facility] physician.  Hospice eligibility pending at this time.  [facility] [place] [facility] is unable to offer a room today. Hospital Liaison will follow up tomorrow or sooner if a room becomes available. Please do not hesitate to call with questions.    Thank you for the opportunity to participate in this patient's care.  Chrislyn Edison Pace, BSN, RN [place] (listed on [place] under Hospice/[facility])    [phone]

## 2020-05-17 NOTE — Progress Notes (Signed)
   05/17/20 1001  Clinical Encounter Type  Visited With Family;Health care provider  Visit Type Initial;Spiritual support;Critical Care  Referral From Nurse  Consult/Referral To Chaplain   Chaplain responded to call from Ken Caryl on 4E. Pt's family was meeting in the waiting area and staff requested extra support for the family. Pt's family stated they had their own minister coming but were grateful for chaplain's presence. This chaplain passed the information on to palliative chaplain, Dorian Pod. Chaplain remains available.    This note was prepared by Chaplain Resident, Dante Gang, MDiv. Chaplain remains available as needed through the on-call pager: [phone].

## 2020-05-17 NOTE — Progress Notes (Signed)
PROGRESS NOTE    [name]  JQD:[id number] DOB: Jul 02, 1945 DOA: 05/09/2020 PCP: Hendricks Limes, MD   Chief Complain: Altered mental,hypoglycemia  Brief Narrative: [name] a 75 y.o.femalewith medical history significant of breast CA in 1990's  s/p mastectomy, chemotherapy, CVA, AS s/p AVR in 2006, then TAVR in 2021, HFpEF + CHB + PPM (placed 01/2019)-bacteremia group B strep 06/2019 status post PPM explantation and then subsequent Medtronic Micra, h/o right lower extremity ischemia status post angiography in 12/2019, h/o insulin dependent diabetes .She was admitted from  2/8 - 2/16 with critical ischemia of R leg and open ulcer of heel growing Morganella morganii on culture. Other issues included Sacral decubitus ulcer, and urinary retention.Patient was discharged on Invanz through March 25th She has FTT ,has been at nursing home for about a year, family noticed progressive decline for about 29-month, with poor oral intake, weight loss of 30 to 40 pounds, has been bedbound, needing to be fed. She was brought to the ED from nursing home this time  due to altered mental status, found to have hypoglycemia, blood glucose 29, hypothermia and hypotension.  She was admitted for further management of acute metabolic encephalopathy possible sepsis from sacral decubitus ulcer/heel ulcer.  Currently on broad-spectrum antibiotics.  ID following.  Palliative care  also consulted for goals of care.  Remains full code Due to worsening status of the patient, we have reconsulted palliative care,plan for family meeting today.  Assessment & Plan:   Principal Problem:   Toxic metabolic encephalopathy Active Problems:   DM2 (diabetes mellitus, type 2) (HCC)   Hypertension   Hypoglycemia   Benign hypertensive heart and kidney disease with diastolic CHF, NYHA class 3 and CKD stage 3 (HCC)   Sacral decubitus ulcer, stage IV (HCC)   Sepsis (HCC)   Catheter-associated urinary tract infection  (HCC)   Hypothermia   Decubitus ulcer of right heel, stage 3 (HCC)   Anasarca   Protein-calorie malnutrition, severe   Hypoalbuminemia   Acute metabolic encephalopathy: Present on admission.  ABG done 05/13/2020 did not show any hypercarbia.  Monitor mental status.  Remains lethargic  Suspected sepsis: Presented with hypothermia, hypotension, leukocytosis, lactic acidosis.  Source of infection suspected to be from  sacral decubitus ulcer/heel ulcer.  Right foot MRI showed  prominent ulceration along the posterior heel with bands of enhancement within the marrow,no overt bony destruction,suspicious for early osteomyelitis,subcutaneous edema and low-grade enhancement medial and lateral to the ankle potentially reflecting cellulitis. Indwelling tunneled catheter removed.  Blood cultures have not shown any growth.  Urine culture showed yeast.  Indwelling Foley catheter change in the emergency department.  She was started on broad-spectrum antibiotics.  ID consulted and following.  Currently on Invanz Spiked fever on 3/20 with worsening WBC count worsening mental status.  Chest x-ray did not show  any pneumonia. She has fever of 101.1 today.  Since she also developed leukocytosis, ID  ordered MRI of the lumbar spine to rule out deep abscess but it was found to be negative.  Acute on chronic normocytic anemia/thrombocytopenia: Hemoglobin dropped to 5.6 today.  Patient is Jehovah's Witness ,doesnot accept any blood products.  Continue to monitor hemoglobin.  No evidence of active bleeding. Iron studies showed normal iron  Hypoglycemia/history of type 2 diabetes: On presentation, she was hypoglycemic with blood sugar of 29.  She was initially given D10 infusion.  Also started on tube feeds.  Currently on sliding scale.  Blood sugar stable  History of hypertension:  Presented with hypotension.  Blood pressure has normalized.  Monitor.  Antihypertensives on hold.  Hypothermia: Resolved. Now is  febrile  CKD stage IIIa: Currently kidney function at baseline.  Monitor.  Avoid nephrotoxins.  History of peripheral vascular disease: She has history of right lower extremity  Critical ischemia status post angiography on 11/21.  Vascular surgery has recommended aspirin, Plavix indefinitely.  statin, being given by tube.  History of hyperlipidemia: On atorvastatin.  History of CVA:H/o AS s/p AVR in 2006, then TAVR in 2021,  H/o HFpEF + CHB + PPM  H/o bacteremia group B strep 06/2019 status post PPM explantation and then subsequent Medtronic Micra placement On aspirin,Plavix/statin   History of breast cancer: Status post left mastectomy, chemotherapy.  Now in remission.  Severe protein calorie malnutrition/poor oral intake: Due to immobility, bedbound status.  History of 40 pound weight loss in the last 4 months.  Albumin less than 1.  Nutrition following.  Currently on tube feeding diet.  I do not think she is a candidate for PEG  Severe electrolyte abnormalities: Currently stable.  Most likely from poor oral intake.  Continue to monitor.  She had low phosphorus from refeeding syndrome,now normalised.  Goals of care: Elderly patient with multiple comorbidities.  Palliative care  consulted and following.  Remains full code.  Extensive discussion have been held about goals of care multiple times focusing on her poor prognosis, multiple comorbidities.  Family meeting today with palliative care..   Pressure Injury 05/10/20 Sacrum Stage 4 - Full thickness tissue loss with exposed bone, tendon or muscle. (Active)  05/10/20   Location: Sacrum  Location Orientation:   Staging: Stage 4 - Full thickness tissue loss with exposed bone, tendon or muscle.  Wound Description (Comments):   Present on Admission: Yes     Pressure Injury 05/10/20 Heel Right Stage 4 - Full thickness tissue loss with exposed bone, tendon or muscle. (Active)  05/10/20   Location: Heel  Location Orientation: Right   Staging: Stage 4 - Full thickness tissue loss with exposed bone, tendon or muscle.  Wound Description (Comments):   Present on Admission: Yes            Nutrition Problem: Severe Malnutrition Etiology: chronic illness (poor mental status/PVD with chronic wounds)      DVT prophylaxis:lovenox Code Status: Full Family Communication: Discussed with Daughter Rechel daily Status is: Inpatient  Remains inpatient appropriate because:Hemodynamically unstable   Dispo: The patient is from: SNF              Anticipated d/c is to:Not sure              Patient currently is not medically stable to d/c.   Difficult to place patient No      Consultants: ID  Procedures:None  Antimicrobials:  Anti-infectives (From admission, onward)   Start     Dose/Rate Route Frequency Ordered Stop   05/14/20 1300  vancomycin (VANCOREADY) IVPB 750 mg/150 mL  Status:  Discontinued        750 mg 150 mL/hr over 60 Minutes Intravenous Every 24 hours 05/13/20 1415 05/15/20 1015   05/13/20 1345  fluconazole (DIFLUCAN) tablet 100 mg  Status:  Discontinued        100 mg Per Tube Daily 05/13/20 1251 05/13/20 1358   05/13/20 1315  vancomycin (VANCOREADY) IVPB 1250 mg/250 mL        1,250 mg 166.7 mL/hr over 90 Minutes Intravenous  Once 05/13/20 1233 05/13/20 1454   05/11/20 2200  ertapenem (INVANZ) 1,000 mg in sodium chloride 0.9 % 100 mL IVPB        1 g 200 mL/hr over 30 Minutes Intravenous Every 24 hours 05/11/20 0959     05/10/20 2100  vancomycin (VANCOREADY) IVPB 1000 mg/200 mL  Status:  Discontinued        1,000 mg 200 mL/hr over 60 Minutes Intravenous Every 24 hours 05/09/20 2040 05/11/20 1124   05/10/20 0800  ceFEPIme (MAXIPIME) 2 g in sodium chloride 0.9 % 100 mL IVPB  Status:  Discontinued        2 g 200 mL/hr over 30 Minutes Intravenous Every 12 hours 05/09/20 2040 05/09/20 2146   05/09/20 2300  meropenem (MERREM) 2 g in sodium chloride 0.9 % 100 mL IVPB        2 g 200 mL/hr over 30  Minutes Intravenous Every 12 hours 05/09/20 2146 05/11/20 1034   05/09/20 2000  vancomycin (VANCOREADY) IVPB 1000 mg/200 mL        1,000 mg 200 mL/hr over 60 Minutes Intravenous  Once 05/09/20 1908 05/09/20 2157   05/09/20 1915  ceFEPIme (MAXIPIME) 2 g in sodium chloride 0.9 % 100 mL IVPB        2 g 200 mL/hr over 30 Minutes Intravenous  Once 05/09/20 1908 05/09/20 1959   05/09/20 1915  metroNIDAZOLE (FLAGYL) IVPB 500 mg        500 mg 100 mL/hr over 60 Minutes Intravenous  Once 05/09/20 1908 05/09/20 2056      Subjective: Patient seen and examined the bedside this morning.  Medically stable.  She is still lethargic but opens her eyes readily when I called her name.  Looks little more alert today.  Denies any complaints.    Objective: Vitals:   05/16/20 2323 05/17/20 0439 05/17/20 0500 05/17/20 0530  BP: 96/65 112/81    Pulse: 73 69    Resp: 18 19    Temp: 99.5 F (37.5 C) (!) 101.1 F (38.4 C)  99.6 F (37.6 C)  TempSrc: Oral Oral  Oral  SpO2: 97% 90%    Weight:   87.5 kg   Height:        Intake/Output Summary (Last 24 hours) at 05/17/2020 0742 Last data filed at 05/16/2020 1459 Gross per 24 hour  Intake 4318 ml  Output 600 ml  Net 3718 ml   Filed Weights   05/14/20 0437 05/16/20 0500 05/17/20 0500  Weight: 62.1 kg 87.1 kg 87.5 kg    Examination:  General exam: Chronically ill looking, lethargic HEENT: Feeding tube Respiratory system: Bilateral diminished breath sounds, no wheezes or crackles  Cardiovascular system: S1 & S2 heard, RRR. No JVD, murmurs, rubs, gallops or clicks. Gastrointestinal system: Abdomen is nondistended, soft and nontender. No organomegaly or masses felt. Normal bowel sounds heard. Central nervous system: Not alert or oriented. Extremities: No edema, no clubbing ,no cyanosis Skin: Pressure ulcers as above, right heel ulcer GU: Foley.        Data Reviewed: I have personally reviewed following labs and imaging studies  CBC: Recent  Labs  Lab 05/13/20 0058 05/14/20 0500 05/15/20 0500 05/16/20 0500 05/17/20 0100  WBC 14.9* 15.6* 12.4* 12.0* 11.4*  NEUTROABS 11.5* 11.5* 8.5* 8.9* 8.3*  HGB 7.4* 6.4* 6.2* 5.6* 5.6*  HCT 22.0* 19.6* 18.4* 17.3* 17.6*  MCV 92.8 95.1 94.8 95.6 98.9  PLT 162 148* 158 171 003   Basic Metabolic Panel: Recent Labs  Lab 05/11/20 0500 05/12/20 0431 05/13/20 0058 05/13/20 2128 05/14/20 0500  05/15/20 0500 05/17/20 0100  NA 137 133* 138  --  140 142 143  K 4.0 3.1* 5.0  --  4.7 5.0 5.4*  CL 108 105 110  --  110 111 117*  CO2 $Re'22 23 24  'qnt$ --  24 25 21*  GLUCOSE 62* 387* 209*  --  217* 194* 173*  BUN $Re'13 11 16  'eHF$ --  22 31* 43*  CREATININE 1.21* 1.16* 1.19*  --  1.34* 1.25* 1.19*  CALCIUM 7.2* 6.8* 7.0*  --  7.0* 7.4* 7.3*  MG 1.6* 1.8  --   --  2.0  --   --   PHOS  --  1.8* 1.3* 4.4 4.1 3.6  --    GFR: Estimated Creatinine Clearance: 44.8 mL/min (A) (by C-G formula based on SCr of 1.19 mg/dL (H)). Liver Function Tests: Recent Labs  Lab 05/11/20 0500 05/14/20 0500 05/17/20 0100  AST $Re'31 27 28  'LuG$ ALT $R'22 18 18  'HT$ ALKPHOS 127* 232* 179*  BILITOT 1.0 0.6 0.5  PROT 4.1* 4.5* 4.6*  ALBUMIN <1.0* <1.0* 1.1*   No results for input(s): LIPASE, AMYLASE in the last 168 hours. No results for input(s): AMMONIA in the last 168 hours. Coagulation Profile: No results for input(s): INR, PROTIME in the last 168 hours. Cardiac Enzymes: No results for input(s): CKTOTAL, CKMB, CKMBINDEX, TROPONINI in the last 168 hours. BNP (last 3 results) Recent Labs    08/11/19 1638 08/23/19 1701  PROBNP 2,473* 3,638*   HbA1C: No results for input(s): HGBA1C in the last 72 hours. CBG: Recent Labs  Lab 05/16/20 1156 05/16/20 1629 05/16/20 2006 05/16/20 2323 05/17/20 0438  GLUCAP 152* 133* 146* 155* 153*   Lipid Profile: No results for input(s): CHOL, HDL, LDLCALC, TRIG, CHOLHDL, LDLDIRECT in the last 72 hours. Thyroid Function Tests: No results for input(s): TSH, T4TOTAL, FREET4, T3FREE, THYROIDAB  in the last 72 hours. Anemia Panel: Recent Labs    05/14/20 0909  FERRITIN 566*  TIBC NOT CALCULATED  IRON 43   Sepsis Labs: Recent Labs  Lab 05/11/20 0500 05/13/20 1302 05/13/20 1623  LATICACIDVEN 1.4 1.9 1.2    Recent Results (from the past 240 hour(s))  Blood Culture (routine x 2)     Status: None   Collection Time: 05/09/20  7:25 PM   Specimen: BLOOD  Result Value Ref Range Status   Specimen Description BLOOD SITE NOT SPECIFIED  Final   Special Requests   Final    BOTTLES DRAWN AEROBIC AND ANAEROBIC Blood Culture adequate volume   Culture   Final    NO GROWTH 5 DAYS Performed at [facility], [address] [place], [place] [address]    Report Status 05/15/2020 FINAL  Final  Resp Panel by RT-PCR (Flu A&B, Covid) Nasopharyngeal Swab     Status: None   Collection Time: 05/09/20  7:30 PM   Specimen: Nasopharyngeal Swab; Nasopharyngeal(NP) swabs in vial transport medium  Result Value Ref Range Status   SARS Coronavirus 2 by RT PCR NEGATIVE NEGATIVE Final    Comment: (NOTE) SARS-CoV-2 target nucleic acids are NOT DETECTED.  The SARS-CoV-2 RNA is generally detectable in upper respiratory specimens during the acute phase of infection. The lowest concentration of SARS-CoV-2 viral copies this assay can detect is 138 copies/mL. A negative result does not preclude SARS-Cov-2 infection and should not be used as the sole basis for treatment or other patient management decisions. A negative result may occur with  improper specimen collection/handling, submission of specimen other than  nasopharyngeal swab, presence of viral mutation(s) within the areas targeted by this assay, and inadequate number of viral copies(<138 copies/mL). A negative result must be combined with clinical observations, patient history, and epidemiological information. The expected result is Negative.  Fact Sheet for Patients:  EntrepreneurPulse.com.au  Fact Sheet for  Healthcare Providers:  IncredibleEmployment.be  This test is no t yet approved or cleared by the [place] FDA and  has been authorized for detection and/or diagnosis of SARS-CoV-2 by FDA under an Emergency Use Authorization (EUA). This EUA will remain  in effect (meaning this test can be used) for the duration of the COVID-19 declaration under Section 564(b)(1) of the Act, 21 U.S.C.section 360bbb-3(b)(1), unless the authorization is terminated  or revoked sooner.       Influenza A by PCR NEGATIVE NEGATIVE Final   Influenza B by PCR NEGATIVE NEGATIVE Final    Comment: (NOTE) The Xpert Xpress SARS-CoV-2/FLU/RSV plus assay is intended as an aid in the diagnosis of influenza from Nasopharyngeal swab specimens and should not be used as a sole basis for treatment. Nasal washings and aspirates are unacceptable for Xpert Xpress SARS-CoV-2/FLU/RSV testing.  Fact Sheet for Patients: EntrepreneurPulse.com.au  Fact Sheet for Healthcare Providers: IncredibleEmployment.be  This test is not yet approved or cleared by the [place] FDA and has been authorized for detection and/or diagnosis of SARS-CoV-2 by FDA under an Emergency Use Authorization (EUA). This EUA will remain in effect (meaning this test can be used) for the duration of the COVID-19 declaration under Section 564(b)(1) of the Act, 21 U.S.C. section 360bbb-3(b)(1), unless the authorization is terminated or revoked.  Performed at [facility], [place] [address] [place], [place] [address]   Urine culture     Status: Abnormal   Collection Time: 05/11/20 10:10 AM   Specimen: In/Out Cath Urine  Result Value Ref Range Status   Specimen Description IN/OUT CATH URINE  Final   Special Requests   Final    NONE Performed at [facility], [place] [address], [place], [place] [address]    Culture 8,000 COLONIES/mL YEAST (A)  Final   Report Status 05/12/2020 FINAL   Final  Culture, blood (routine x 2)     Status: None (Preliminary result)   Collection Time: 05/13/20  1:12 PM   Specimen: BLOOD  Result Value Ref Range Status   Specimen Description BLOOD RIGHT ANTECUBITAL  Final   Special Requests   Final    BOTTLES DRAWN AEROBIC ONLY Blood Culture results may not be optimal due to an inadequate volume of blood received in culture bottles   Culture   Final    NO GROWTH 4 DAYS Performed at [facility], [place] [address], [place], [place] [address]    Report Status PENDING  Incomplete  Culture, blood (routine x 2)     Status: None (Preliminary result)   Collection Time: 05/13/20  1:29 PM   Specimen: BLOOD RIGHT HAND  Result Value Ref Range Status   Specimen Description BLOOD RIGHT HAND  Final   Special Requests   Final    BOTTLES DRAWN AEROBIC ONLY Blood Culture results may not be optimal due to an inadequate volume of blood received in culture bottles   Culture   Final    NO GROWTH 4 DAYS Performed at [facility], [place] [address] [place],  [address]    Report Status PENDING  Incomplete         Radiology Studies: MR Lumbar  Spine W Wo Contrast  Result Date: 05/15/2020 CLINICAL DATA:  Epidural abscess. EXAM: MRI LUMBAR SPINE WITHOUT AND WITH CONTRAST TECHNIQUE: Multiplanar and multiecho pulse sequences of the lumbar spine were obtained without and with intravenous contrast. CONTRAST:  28mL GADAVIST GADOBUTROL 1 MMOL/ML IV SOLN COMPARISON:  None. FINDINGS: Segmentation:  Standard. Alignment:  Physiologic. Vertebrae:  No fracture, evidence of discitis, or bone lesion. Conus medullaris and cauda equina: Conus extends to the L1 level. Conus and cauda equina appear normal. Paraspinal and other soft tissues: Diffuse edema of the posterior subcutaneous with soft tissue thickening in the sacrococcygeal region. This is only partially seen on sagittal views. Disc levels: T12-L1: No spinal canal or neural foraminal stenosis. L1-2: No spinal  canal or neural foraminal stenosis. L2-3: No spinal canal or neural foraminal stenosis. L3-4: Shallow disc bulge and mild facet degenerative changes without significant spinal canal or neural foraminal stenosis. L4-5: Loss of disc height, disc bulge, mild facet degenerative change ligamentum flavum redundancy resulting in mild spinal canal stenosis with narrowing of the bilateral subarticular zones and moderate bilateral neural foraminal narrowing. L4-5: Loss of disc height, narrowing of the right subarticular zone and mild right neural foraminal narrowing. IMPRESSION: 1. No evidence of and epidural abscess. 2. Soft tissue thickening in the sacrococcygeal region, which is only partially seen on sagittal views. 3. Mild degenerative changes at L4-5 and L4-5 with mild spinal canal stenosis with narrowing of the bilateral subarticular zones and moderate bilateral neural foraminal narrowing at L4-5. Electronically Signed   By: Pedro Earls M.D.   On: 05/15/2020 16:28   IR Removal Tun Cv Cath W/O FL  Result Date: 05/16/2020 INDICATION: Patient with history of osteomyelitis in need of durable IV access for long-term IV antibiotic use s/p tunneled right IJ CVC placement in IR 04/11/2020. Patient no longer requiring IV antibiotics. Request is made for removal of tunneled CVC. EXAM: REMOVAL OF TUNNELED CENTRAL VENOUS CATHETER MEDICATIONS: 6 mL 1% lidocaine COMPLICATIONS: None immediate. PROCEDURE: Informed written consent was obtained from the patient's daughter following an explanation of the procedure, risks, benefits and alternatives to treatment. A time out was performed prior to the initiation of the procedure. Maximal barrier sterile technique was utilized including mask, sterile gowns, sterile gloves, hand hygiene, and Hibiclens. 1% lidocaine was injected under sterile conditions along the subcutaneous tunnel. Utilizing a combination of blunt dissection and gentle traction, the catheter was removed  intact. Hemostasis was obtained with manual compression. A dressing was placed. The patient tolerated the procedure well without immediate post procedural complication. IMPRESSION: Successful removal of tunneled central venous catheter. Read by: Earley Abide, PA-C Electronically Signed   By: Aletta Edouard M.D.   On: 05/16/2020 15:58        Scheduled Meds: . aspirin EC  81 mg Oral Daily  . atorvastatin  10 mg Per Tube QHS  . Chlorhexidine Gluconate Cloth  6 each Topical Daily  . clopidogrel  75 mg Oral Daily  . collagenase   Topical Daily  . feeding supplement (PROSource TF)  45 mL Per Tube TID  . folic acid  1 mg Per Tube Daily  . insulin aspart  0-6 Units Subcutaneous Q4H  . mirtazapine  15 mg Per Tube QHS   Continuous Infusions: . ertapenem 1,000 mg (05/16/20 2300)  . feeding supplement (OSMOLITE 1.5 CAL) 1,000 mL (05/16/20 1233)     LOS: 8 days    Time spent:35 mins. More than 50% of that time was spent in counseling  and/or coordination of care.      Shelly Coss, MD [facility] P3/24/2022, 7:42 AM

## 2020-05-17 NOTE — Progress Notes (Signed)
Daily Progress Note   Patient Name: [name]       Date: 05/17/2020 DOB: 1945/12/04  Age: 75 y.o. MRN#: [id number] Attending Physician: Shelly Coss, MD Primary Care Physician: Hendricks Limes, MD Admit Date: 05/09/2020  Reason for Consultation/Follow-up: Establishing goals of care  Subjective: Patient somnolent but easily aroused. Alert to self. Complains of some pain but unable to state location. RN and Tech recently bathed and repositioned.   Large presence of family is at the bedside (12 members including patient's 8 children, her sister, and granddaughters). Chaplain also present for support.   Detailed discussion with family with updates provided. Family tearful acknowledging patient's continued decline despite all medical interventions and efforts. Patient is a Jehovah Witness and would not wish to receive blood transfusions in the setting of hemoglobin 5.6. Family mutually agree they would not want patient to suffer or undergo medical treatments that is not adding any meaningful improvement. Emotional support provided.   I created space and opportunity for patient for family to express emotions and ask questions. Encouraged family as they make difficult decisions to keep [name]'s quality of life and wishes at center of discussion. Daughter reports patient has said previously she would want to have all interventions. Acknowledge daughter's statement with further discussions regarding an individual making decisions at a healthy state however, as health decline or becomes more serious with no improvement it is most important to re-evaluate. Family verbalized agreement.  We discussed patient is alive but due to poor quality of life she is not living. Family tearful and verbalizing agreement.   Education provided on full code status with consideration to current illness and co-morbidities with recommendations for DNR. Family mutually discussed and have all confirmed wishes  for DNR/DNI. Education provided on what DNR will look like for patient and RN will place necessary bracelet to identify wishes to medical team.   The difference between a aggressive medical intervention and a palliative comfort care path were discussed at length. Values and goals of care important to patient and family were attempted to be elicited.   Family has requested to transition all care to focus on patient's comfort. I confirmed their wishes providing detailed education on what comfort care will look like for [name] while hospitalized. We discussed outpatient hospice support to assist in continued end-of-life care.   Family has expressed wishes for referral to be placed to hospice home facility.   Length of Stay: 8 days  Vital Signs: BP 126/70 (BP Location: Right Wrist)   Pulse (!) 50   Temp 100 F (37.8 C) (Oral)   Resp 18   Ht $R'5\' 6"'cd$  (1.676 m)   Wt 87.5 kg   SpO2 92%   BMI 31.15 kg/m  SpO2: SpO2: 92 % O2 Device: O2 Device: Room Air O2 Flow Rate:    Physical Exam: Somnolent, easily awake, frail, chronically ill-appearing  Hypotensive, febrile, bradycardic           Palliative Care Assessment & Plan  HPI: Palliative Care consult requested for goals of care discussion in this 75 y.o. female with multiple medical problems including breast CA s/p mastectomy (90's) & chemotherapy, CVA, AS s/p AVR (2006), TAVR (2021), hypertension, CKD 3, CHF, CAD, right lower extremity ischemia status post angiography (12/2019), and insulin dependent diabetes. She presented to the ED from [facility] facility with complaints of altered mental status. Followed by wound care outpatient due to sacral and heel decubitus. Patient is a Restaurant manager, fast food. Patient's blood  glucose 29 on EMS arrival. Patient was hypothermic (95.6) and hypotensive (80s) on arrival. Patient initially placed on Bair hugger, D10 drip, and IV antibiotics. UA positive for UTI. Since admission appetite remains poor and  family has requested trial period of Coretrak for tube feeding.   Code Status:  DNR  Goals of Care/Recommendations:  Transition care to focus on comfort/EOL care.   Lengthy discussion with family regarding goals of care. Family wishes for patient to be at peace and spend what time she has left with family and not with continued medical interventions.   Will discontinue medications not comfort focused.   Family requesting [facility] referral (TOC order placed ) Morphine PRN for pain/air hunger/comfort Robinul PRN for excessive secretions Ativan PRN for agitation/anxiety Zofran PRN for nausea Liquifilm tears PRN for dry eyes Haldol PRN for agitation/anxiety May have comfort feeding Comfort cart for family Unrestricted visitations in the setting of EOL (per policy) Oxygen PRN 2L or less for comfort. No escalation.   Discontinue tube feedings/Coretrak  PMT will continue to support and follow   Prognosis: < 2 weeks  Discharge Planning: Hospice facility  Thank you for allowing the Palliative Medicine Team to assist in the care of this patient.  Time Total: 95 min.   Visit consisted of counseling and education dealing with the complex and emotionally intense issues of symptom management and palliative care in the setting of serious and potentially life-threatening illness.Greater than 50%  of this time was spent counseling and coordinating care related to the above assessment and plan.  Alda Lea, AGPCNP-BC  Palliative Medicine Team [phone]

## 2020-05-17 NOTE — Progress Notes (Signed)
Called report to [place]. Pt going to rm 26.   Lavenia Atlas, RN

## 2020-05-17 NOTE — Progress Notes (Signed)
Date and time results received: 05/17/20  At 100  Test:hgb Critical Value: 5.6  Name of Provider Notified: Tawanna Solo  MD aware.   Lavenia Atlas, RN

## 2020-05-17 NOTE — Progress Notes (Signed)
Subjective: Patient is more somnolent today   Antibiotics:  Anti-infectives (From admission, onward)   Start     Dose/Rate Route Frequency Ordered Stop   05/14/20 1300  vancomycin (VANCOREADY) IVPB 750 mg/150 mL  Status:  Discontinued        750 mg 150 mL/hr over 60 Minutes Intravenous Every 24 hours 05/13/20 1415 05/15/20 1015   05/13/20 1345  fluconazole (DIFLUCAN) tablet 100 mg  Status:  Discontinued        100 mg Per Tube Daily 05/13/20 1251 05/13/20 1358   05/13/20 1315  vancomycin (VANCOREADY) IVPB 1250 mg/250 mL        1,250 mg 166.7 mL/hr over 90 Minutes Intravenous  Once 05/13/20 1233 05/13/20 1454   05/11/20 2200  ertapenem (INVANZ) 1,000 mg in sodium chloride 0.9 % 100 mL IVPB  Status:  Discontinued        1 g 200 mL/hr over 30 Minutes Intravenous Every 24 hours 05/11/20 0959 05/17/20 1002   05/10/20 2100  vancomycin (VANCOREADY) IVPB 1000 mg/200 mL  Status:  Discontinued        1,000 mg 200 mL/hr over 60 Minutes Intravenous Every 24 hours 05/09/20 2040 05/11/20 1124   05/10/20 0800  ceFEPIme (MAXIPIME) 2 g in sodium chloride 0.9 % 100 mL IVPB  Status:  Discontinued        2 g 200 mL/hr over 30 Minutes Intravenous Every 12 hours 05/09/20 2040 05/09/20 2146   05/09/20 2300  meropenem (MERREM) 2 g in sodium chloride 0.9 % 100 mL IVPB        2 g 200 mL/hr over 30 Minutes Intravenous Every 12 hours 05/09/20 2146 05/11/20 1034   05/09/20 2000  vancomycin (VANCOREADY) IVPB 1000 mg/200 mL        1,000 mg 200 mL/hr over 60 Minutes Intravenous  Once 05/09/20 1908 05/09/20 2157   05/09/20 1915  ceFEPIme (MAXIPIME) 2 g in sodium chloride 0.9 % 100 mL IVPB        2 g 200 mL/hr over 30 Minutes Intravenous  Once 05/09/20 1908 05/09/20 1959   05/09/20 1915  metroNIDAZOLE (FLAGYL) IVPB 500 mg        500 mg 100 mL/hr over 60 Minutes Intravenous  Once 05/09/20 1908 05/09/20 2056      Medications: Scheduled Meds: . collagenase   Topical Daily   Continuous  Infusions:  PRN Meds:.antiseptic oral rinse, glycopyrrolate, haloperidol lactate, ipratropium-albuterol, lidocaine (PF), morphine injection, morphine CONCENTRATE, ondansetron **OR** ondansetron (ZOFRAN) IV, polyvinyl alcohol    Objective: Weight change: 0.454 kg  Intake/Output Summary (Last 24 hours) at 05/17/2020 1347 Last data filed at 05/17/2020 0916 Gross per 24 hour  Intake 1127 ml  Output 600 ml  Net 527 ml   Blood pressure 126/70, pulse (!) 50, temperature 100 F (37.8 C), temperature source Oral, resp. rate 18, height $RemoveBe'5\' 6"'RFyOHtnEX$  (1.676 m), weight 87.5 kg, SpO2 100 %. Temp:  [98.5 F (36.9 C)-101.1 F (38.4 C)] 100 F (37.8 C) (03/24 0916) Pulse Rate:  [50-104] 50 (03/24 0916) Resp:  [18-20] 18 (03/24 0916) BP: (96-126)/(43-81) 126/70 (03/24 0916) SpO2:  [90 %-100 %] 100 % (03/24 1135) FiO2 (%):  [21 %] 21 % (03/24 1135) Weight:  [87.5 kg] 87.5 kg (03/24 0500)  Physical Exam: Physical Exam Eyes:     Extraocular Movements: Extraocular movements intact.  Cardiovascular:     Rate and Rhythm: Normal rate.     Heart sounds: Murmur heard.  No gallop.  Pulmonary:     Effort: Pulmonary effort is normal. No respiratory distress.     Breath sounds: Normal breath sounds.  Abdominal:     General: Bowel sounds are normal. There is no distension.     Palpations: There is no mass.  Neurological:     Mental Status: She is alert.     Comments: Oriented to person and place  Psychiatric:        Mood and Affect: Affect is tearful.        Cognition and Memory: Cognition is impaired. Memory is impaired.       CBC:    BMET Recent Labs    05/15/20 0500 05/17/20 0100  NA 142 143  K 5.0 5.4*  CL 111 117*  CO2 25 21*  GLUCOSE 194* 173*  BUN 31* 43*  CREATININE 1.25* 1.19*  CALCIUM 7.4* 7.3*     Liver Panel  Recent Labs    05/17/20 0100  PROT 4.6*  ALBUMIN 1.1*  AST 28  ALT 18  ALKPHOS 179*  BILITOT 0.5       Sedimentation Rate No results for input(s):  ESRSEDRATE in the last 72 hours. C-Reactive Protein Recent Labs    05/16/20 0500  CRP 2.1*    Micro Results: Recent Results (from the past 720 hour(s))  Blood Culture (routine x 2)     Status: None   Collection Time: 05/09/20  7:25 PM   Specimen: BLOOD  Result Value Ref Range Status   Specimen Description BLOOD SITE NOT SPECIFIED  Final   Special Requests   Final    BOTTLES DRAWN AEROBIC AND ANAEROBIC Blood Culture adequate volume   Culture   Final    NO GROWTH 5 DAYS Performed at [facility], [address], [place], [place] [address]    Report Status 05/15/2020 FINAL  Final  Resp Panel by RT-PCR (Flu A&B, Covid) Nasopharyngeal Swab     Status: None   Collection Time: 05/09/20  7:30 PM   Specimen: Nasopharyngeal Swab; Nasopharyngeal(NP) swabs in vial transport medium  Result Value Ref Range Status   SARS Coronavirus 2 by RT PCR NEGATIVE NEGATIVE Final    Comment: (NOTE) SARS-CoV-2 target nucleic acids are NOT DETECTED.  The SARS-CoV-2 RNA is generally detectable in upper respiratory specimens during the acute phase of infection. The lowest concentration of SARS-CoV-2 viral copies this assay can detect is 138 copies/mL. A negative result does not preclude SARS-Cov-2 infection and should not be used as the sole basis for treatment or other patient management decisions. A negative result may occur with  improper specimen collection/handling, submission of specimen other than nasopharyngeal swab, presence of viral mutation(s) within the areas targeted by this assay, and inadequate number of viral copies(<138 copies/mL). A negative result must be combined with clinical observations, patient history, and epidemiological information. The expected result is Negative.  Fact Sheet for Patients:  EntrepreneurPulse.com.au  Fact Sheet for Healthcare Providers:  IncredibleEmployment.be  This test is no t yet approved or cleared by the  [place] FDA and  has been authorized for detection and/or diagnosis of SARS-CoV-2 by FDA under an Emergency Use Authorization (EUA). This EUA will remain  in effect (meaning this test can be used) for the duration of the COVID-19 declaration under Section 564(b)(1) of the Act, 21 U.S.C.section 360bbb-3(b)(1), unless the authorization is terminated  or revoked sooner.       Influenza A by PCR NEGATIVE NEGATIVE Final   Influenza B by PCR NEGATIVE  NEGATIVE Final    Comment: (NOTE) The Xpert Xpress SARS-CoV-2/FLU/RSV plus assay is intended as an aid in the diagnosis of influenza from Nasopharyngeal swab specimens and should not be used as a sole basis for treatment. Nasal washings and aspirates are unacceptable for Xpert Xpress SARS-CoV-2/FLU/RSV testing.  Fact Sheet for Patients: EntrepreneurPulse.com.au  Fact Sheet for Healthcare Providers: IncredibleEmployment.be  This test is not yet approved or cleared by the [place] FDA and has been authorized for detection and/or diagnosis of SARS-CoV-2 by FDA under an Emergency Use Authorization (EUA). This EUA will remain in effect (meaning this test can be used) for the duration of the COVID-19 declaration under Section 564(b)(1) of the Act, 21 U.S.C. section 360bbb-3(b)(1), unless the authorization is terminated or revoked.  Performed at [facility], [place] [address] [place], [place] [address]   Urine culture     Status: Abnormal   Collection Time: 05/11/20 10:10 AM   Specimen: In/Out Cath Urine  Result Value Ref Range Status   Specimen Description IN/OUT CATH URINE  Final   Special Requests   Final    NONE Performed at [facility], [place] [address], [place], [place] [address]    Culture 8,000 COLONIES/mL YEAST (A)  Final   Report Status 05/12/2020 FINAL  Final  Culture, blood (routine x 2)     Status: None (Preliminary result)   Collection Time: 05/13/20  1:12 PM    Specimen: BLOOD  Result Value Ref Range Status   Specimen Description BLOOD RIGHT ANTECUBITAL  Final   Special Requests   Final    BOTTLES DRAWN AEROBIC ONLY Blood Culture results may not be optimal due to an inadequate volume of blood received in culture bottles   Culture   Final    NO GROWTH 4 DAYS Performed at [facility], [place] [address] [place], [place] [address]    Report Status PENDING  Incomplete  Culture, blood (routine x 2)     Status: None (Preliminary result)   Collection Time: 05/13/20  1:29 PM   Specimen: BLOOD RIGHT HAND  Result Value Ref Range Status   Specimen Description BLOOD RIGHT HAND  Final   Special Requests   Final    BOTTLES DRAWN AEROBIC ONLY Blood Culture results may not be optimal due to an inadequate volume of blood received in culture bottles   Culture   Final    NO GROWTH 4 DAYS Performed at [facility], [place] [address] [place], [place] [address]    Report Status PENDING  Incomplete    Studies/Results: MR Lumbar Spine W Wo Contrast  Result Date: 05/15/2020 CLINICAL DATA:  Epidural abscess. EXAM: MRI LUMBAR SPINE WITHOUT AND WITH CONTRAST TECHNIQUE: Multiplanar and multiecho pulse sequences of the lumbar spine were obtained without and with intravenous contrast. CONTRAST:  43mL GADAVIST GADOBUTROL 1 MMOL/ML IV SOLN COMPARISON:  None. FINDINGS: Segmentation:  Standard. Alignment:  Physiologic. Vertebrae:  No fracture, evidence of discitis, or bone lesion. Conus medullaris and cauda equina: Conus extends to the L1 level. Conus and cauda equina appear normal. Paraspinal and other soft tissues: Diffuse edema of the posterior subcutaneous with soft tissue thickening in the sacrococcygeal region. This is only partially seen on sagittal views. Disc levels: T12-L1: No spinal canal or neural foraminal stenosis. L1-2: No spinal canal or neural foraminal stenosis. L2-3: No spinal canal or neural foraminal stenosis. L3-4: Shallow disc bulge and mild facet  degenerative changes without significant spinal canal or neural  foraminal stenosis. L4-5: Loss of disc height, disc bulge, mild facet degenerative change ligamentum flavum redundancy resulting in mild spinal canal stenosis with narrowing of the bilateral subarticular zones and moderate bilateral neural foraminal narrowing. L4-5: Loss of disc height, narrowing of the right subarticular zone and mild right neural foraminal narrowing. IMPRESSION: 1. No evidence of and epidural abscess. 2. Soft tissue thickening in the sacrococcygeal region, which is only partially seen on sagittal views. 3. Mild degenerative changes at L4-5 and L4-5 with mild spinal canal stenosis with narrowing of the bilateral subarticular zones and moderate bilateral neural foraminal narrowing at L4-5. Electronically Signed   By: Baldemar Lenis M.D.   On: 05/15/2020 16:28   IR Removal Tun Cv Cath W/O FL  Result Date: 05/16/2020 INDICATION: Patient with history of osteomyelitis in need of durable IV access for long-term IV antibiotic use s/p tunneled right IJ CVC placement in IR 04/11/2020. Patient no longer requiring IV antibiotics. Request is made for removal of tunneled CVC. EXAM: REMOVAL OF TUNNELED CENTRAL VENOUS CATHETER MEDICATIONS: 6 mL 1% lidocaine COMPLICATIONS: None immediate. PROCEDURE: Informed written consent was obtained from the patient's daughter following an explanation of the procedure, risks, benefits and alternatives to treatment. A time out was performed prior to the initiation of the procedure. Maximal barrier sterile technique was utilized including mask, sterile gowns, sterile gloves, hand hygiene, and Hibiclens. 1% lidocaine was injected under sterile conditions along the subcutaneous tunnel. Utilizing a combination of blunt dissection and gentle traction, the catheter was removed intact. Hemostasis was obtained with manual compression. A dressing was placed. The patient tolerated the procedure well  without immediate post procedural complication. IMPRESSION: Successful removal of tunneled central venous catheter. Read by: Elwin Mocha, PA-C Electronically Signed   By: Irish Lack M.D.   On: 05/16/2020 15:58      Assessment/Plan:  INTERVAL HISTORY: Patient with severe anemia Principal Problem:   Toxic metabolic encephalopathy Active Problems:   DM2 (diabetes mellitus, type 2) (HCC)   Hypertension   Hypoglycemia   Benign hypertensive heart and kidney disease with diastolic CHF, NYHA class 3 and CKD stage 3 (HCC)   Sacral decubitus ulcer, stage IV (HCC)   Sepsis (HCC)   Catheter-associated urinary tract infection (HCC)   Hypothermia   Decubitus ulcer of right heel, stage 3 (HCC)   Anasarca   Protein-calorie malnutrition, severe   Hypoalbuminemia    [name] is a 75 y.o. female with multiple medical problems and dementia with bedbound status and large sacral decubitus ulcer, history of pacemaker the became infected with group B streptococcus in the context of group B streptococcus bacteremia status post extraction of the pacemaker in 2021 with placement of new Medtronic Micra.  Patient also underwent a TAVR.  (Note I have discussed her case via secure chat with both Dr. Elberta Fortis as well as the patient's cardiology PA Rollene Rotunda and they have told me that both the pacemaker and the T AVR are MRI safe)  More recently she was admitted to the hospital in February worsening of her sacral wound along with a diabetic foot ulcer with evidence of osteomyelitis on imaging.  She underwent surgery at the podiatry who removed a piece of bone from her calcaneus and sent this to the lab in the path lab no evidence of osteomyelitis was seen under the microscope.  No cultures were done at that time.  She did have a prior culture from her wound that grew multiple organisms including Morganella.  Dr. Juleen China recommended a course of ertapenem which the patient was continuing  on.  She was seen by Janene Madeira 2 days prior to admission and appeared to be doing well.  Her inflammatories had been trending.  In the interim however she experienced increasing confusion hypothermia and hypotension in the setting context of hypoglycemia.  She was readmitted to the hospital blood cultures were taken urine cultures were taken and she was placed on broad-spectrum antibiotics with vancomycin and cefepime and then vancomycin and meropenem.  Attained an MRI of the foot last week which was reassuring and then narrowed her back to ertapenem.  Over the weekend she had a low-grade fever and blood cultures were collected again and vancomycin reinstituted.  For what is aware it is worth her white cell are trending upward again along with her inflammatory markers.  With blood cultures negative I am stoped her vancomycin   I obtained an MRI of her lumbosacral area and this did not show any evidence of deep infection  As there is no need for long-term IV antibiotics for it we discontinued her central line.  Failure to thrive: I do not see her improving long-term with her poor nutritional status and in bedbound status and right now severe anemia  Goals of care: My understanding from talking to the daughter is a family meeting happen today.  It sounds as if the family are now comfortable pivoting to comfort care.  I spent greater than 35 minutes with the patient including greater than 50% of time in face to  counsel of the patient and her daughter re her prognosis, the infections she has had, patients predicted trajectory and in coordination of their care. While I am very sorry for the family's impending loss and this is also painful clearly for the patient, the move to pure palliative care is the correct vision.  I completely supported  I will sign off for now please call further questions.       LOS: 8 days   [name] 05/17/2020, 1:47 PM

## 2020-05-17 NOTE — Progress Notes (Signed)
Pt sleeping on arrival to the unit at 1845. 2 daughters bedside (one is a former CNA).  There are 7 children total that are involved in making decisions for pt.  Comfort tray ordered for family, delivered at shift change.  Pt is on an air mattress and opened eyes at end of report. I introduced myself and nightshift nurse. She said, "Hi!" and went back to sleep.  Daughter bedside (not former CNA) was asking questions about why pt was not getting any of her regular medications. We explained that when pt is placed on "Comfort Care", all life prolonging medications are removed (like BP medications), and the focus shifts to pain and anxiety management, making them comfortable for transition.  Daughter asked if they could change their minds about putting her on "[place]", since withholding her regular medications seemed like "hastening the dying process."  She thought the only thing she had agreed to was the DNR--not doing CPR if pt went unconscious and pulseless.  Daughter is going to call siblings and discuss to make sure they are all on the same page before asking to talk to Palliative Team about possibly changing the care.

## 2020-05-17 NOTE — Care Management Important Message (Signed)
Important Message  Patient Details  Name: [name] MRN: [id number] Date of Birth: 08/13/1945   Medicare Important Message Given:  Yes     Shelda Altes 05/17/2020, 8:28 AM

## 2020-05-18 DIAGNOSIS — D649 Anemia, unspecified: Secondary | ICD-10-CM | POA: Diagnosis not present

## 2020-05-18 DIAGNOSIS — T83511A Infection and inflammatory reaction due to indwelling urethral catheter, initial encounter: Secondary | ICD-10-CM | POA: Diagnosis not present

## 2020-05-18 DIAGNOSIS — R601 Generalized edema: Secondary | ICD-10-CM | POA: Diagnosis not present

## 2020-05-18 DIAGNOSIS — L89613 Pressure ulcer of right heel, stage 3: Secondary | ICD-10-CM | POA: Diagnosis not present

## 2020-05-18 DIAGNOSIS — Z66 Do not resuscitate: Secondary | ICD-10-CM | POA: Diagnosis not present

## 2020-05-18 DIAGNOSIS — E8809 Other disorders of plasma-protein metabolism, not elsewhere classified: Secondary | ICD-10-CM | POA: Diagnosis not present

## 2020-05-18 DIAGNOSIS — I130 Hypertensive heart and chronic kidney disease with heart failure and stage 1 through stage 4 chronic kidney disease, or unspecified chronic kidney disease: Secondary | ICD-10-CM | POA: Diagnosis not present

## 2020-05-18 DIAGNOSIS — G928 Other toxic encephalopathy: Secondary | ICD-10-CM | POA: Diagnosis not present

## 2020-05-18 LAB — CULTURE, BLOOD (ROUTINE X 2)
Culture: NO GROWTH
Culture: NO GROWTH

## 2020-05-18 LAB — GLUCOSE, CAPILLARY: Glucose-Capillary: 71 mg/dL (ref 70–99)

## 2020-05-18 MED ORDER — ACETAMINOPHEN 325 MG PO TABS
650.0000 mg | ORAL_TABLET | Freq: Four times a day (QID) | ORAL | Status: DC | PRN
  Administered 2020-05-19: 650 mg via ORAL
  Filled 2020-05-18: qty 2

## 2020-05-18 MED ORDER — INSULIN ASPART 100 UNIT/ML ~~LOC~~ SOLN: 0.0000 [IU] | Freq: Two times a day (BID) | SUBCUTANEOUS | Status: DC

## 2020-05-18 NOTE — Progress Notes (Signed)
Rounded on pt. She is sitting up in bed. Two family members bedside. She ate almost all of what they ordered her for dinner.  Pt stated she was not in pain and didn't need anything.

## 2020-05-18 NOTE — Progress Notes (Signed)
PALLIATIVE NOTE::   Palliative Care consult requested for goals of care discussion in this74 y.o.femalewith multiple medical problems including breast CA s/p mastectomy(90's) &chemotherapy, CVA, AS s/p AVR(2006), TAVR(2021),hypertension, CKD 3, CHF, CAD,right lower extremity ischemia status post angiography(12/2019),andinsulin dependent diabetes. She presented to the ED from [facility] facility with complaints of altered mental status. Followed by wound care outpatient due to sacral and heel decubitus. Patient is a Restaurant manager, fast food. Patient's blood glucose 29 on EMS arrival. Patient was hypothermic (95.6) and hypotensive (80s) on arrival. Patient initially placed on Bair hugger, D10 drip, and IV antibiotics. UA positive for UTI. Since admission appetite remains poor and family has requested trial period of Coretrak for tube feeding.   Chart reviewed. Updates Received.   Patient is somnolent but is easily aroused. Will endorse pain or discomfort with movement and dressing changes. She is tolerating some sips only.   Daughter, Apolonio Schneiders stayed overnight and remains at the bedside with patient. Long discussion with daughter regarding goals of care discussion on yesterday. Updates provided.  Her expressed concerns with patient care and not receiving medications.  I reeducated daughter on comfort care and minimization of medications.  I reviewed each medication that was discontinued on yesterday with rationale of no meaningful recovery despite use.  Daughter verbalized understanding however continues to be uncomfortable with patient not having blood sugars monitored and insulin being provided.  She speaks to previous occasions where patient had hyperglycemic episodes.  Education provided with patient having minimal to no oral nutrition we are not concerned with hypoglycemia in addition to the goal of comfort is to focus on the patient and symptom management with no focus on actual numbers  such as blood sugars.  Daughter verbalized understanding however she continues to request patient's blood sugar be monitored and treated with a compromise of twice a day.  Apolonio Schneiders confirms plans to continue with comfort care measures, DNR.  She is aware [facility] does not have a bed to offer today and patient will remain hospitalized until a bed becomes available and is approved.   All questions answered and support provided.    1230: Received notification from nurse that daughter was now hesitant to allow patient to receive as needed pain medication despite patient's complaints of pain and discomfort.  I  spoke with daughter, Apolonio Schneiders and again provided education on treating Ms. Arrick's symptoms confirming the goal is to eliminate her suffering and pain as needed.  Daughter expressing concerns that she has been researching end-of-life care/hospice and feels patient will be overmedicated to expedite her death.  Detailed education regarding the use of medication for comfort with no intentions to hasten death but provide comfort for patient and eliminate any distress.  I advised daughter that although researching Internet can be very beneficial she should be very selective and information that she is reading as these are many people's opinions with uncertainty of their situation etc.  I assured daughter we were not providing any care that would hasten her death and patient's prognosis remains poor.  We discussed patient will pass away when God calls her and that could be anytime with or without medical interventions.  Daughter verbalized understanding and appreciation.   1410: Received a call from daughter requesting to discuss outpatient hospice in the setting of no available bed at [facility].  Education provided on what outpatient hospice would look like.  Daughter is aware family will be responsible for 24/7 care with support of hospice. We discussed the use of medical  equipment, ADL assistance, and  medication use in the home. Apolonio Schneiders verbalized understanding expressing family plans to continue conversations confirming they are still most interest in [facility] once bed becomes available.    1611: Spoke with patient's daughter, [name] and Helen,who is also HCPOA. Updates provided. [name] expressed concerns regarding her twin sister, Rachel's anxiety today and her mom's care. Support provided. [name] states she does not agree with restarted CBG and insulin confirming our goals of care conversation from yesterday. She states 3 of the 8 siblings are having a hard time accepting their mothers prognosis while the others wish for her to remain comfortable with no aggressive interventions. [name] plans to speak with her sister with recommendations to discontinue CBG/Insulin, focus on patient's comfort and symptoms. She states majority of family does not want patient to continue to suffer and hopeful for a bed at [facility] soon if patient does not pass away while hospitalized. Encouraged family to continue ongoing discussions and support for each other during this difficult time and allow for peace and comfort during what time Ms. Forrer has left. Family is tearful and appreciative.   All family has confirmed wishes for plans to remain, focus on comfort, symptom management, DNR, and [facility] once bed is approved and available.   Time Total: 65 min.   Visit consisted of counseling and education dealing with the complex and emotionally intense issues of symptom management and palliative care in the setting of serious and potentially life-threatening illness.Greater than 50%  of this time was spent counseling and coordinating care related to the above assessment and plan.  Alda Lea, AGPCNP-BC  Palliative Medicine Team [phone]

## 2020-05-18 NOTE — Progress Notes (Signed)
Pt slept off and on throughout the day, but was easily arousable.  Pt ate some ice cream, pudding, and some of what the family ordered for dinner.  Pt a/o x3 today, unsure of day/date.  At noon, prior to dressing change and cleaning up from a BM, had tears in her eyes and said she was in pain. I asked if she wanted pain medication, and she said "yes." I let her know that I could give her 0.25 mL of morphine and she said that would be fine. Pt stated pain stated that the morphine was helping with the pain during the dressing change.  Daughter at bedside was not in agreement with administration of the morphine.   I contacted the Palliative Team to further advise.

## 2020-05-18 NOTE — Discharge Summary (Addendum)
Physician Discharge Summary  [name] LGX:[id number] DOB: 06-Sep-1945 DOA: 05/09/2020  PCP: Hendricks Limes, MD  Admit date: 05/09/2020 Discharge date: 05/21/20 Admitted From: Home Disposition:  Residential Hospice  Discharge Condition:Stable CODE STATUS: Comfort Care Diet recommendation:  Regular    Brief/Interim Summary: [name] a 75 y.o.femalewith medical history significant of breast CA in 1990's s/p mastectomy, chemotherapy, CVA, AS s/p AVR in 2006, then TAVR in 2021, HFpEF + CHB + PPM (placed 01/2019)-bacteremia group B strep 06/2019 status post PPM explantation and then subsequent [employer] Micra, h/oright lower extremity ischemia status post angiography in 12/2019, h/o insulin dependent diabetes .She was admitted from  2/8 - 2/16 with critical ischemia of R leg and open ulcer of heel growing Morganella morganii on culture. Other issues included Sacral decubitus ulcer, and urinary retention.Patient was discharged on Invanz through March 25th She has FTT ,has been at nursing home for about a year, family noticed progressive decline for about 81-month, with poor oral intake, weight loss of 30 to 40 pounds, has been bedbound, needing to be fed. She was brought to the ED from nursing home this time  due to altered mental status, found to have hypoglycemia, blood glucose 29, hypothermia and hypotension.  She was admitted for further management of acute metabolic encephalopathy possible sepsis from sacral decubitus ulcer/heel ulcer. She was started on broad-spectrum antibiotics.  Hospital course remarkable for persistent fever.  She had very poor oral intake and confusion.  She had to be started on tube feeding diet.  ID was following. After several discussions with the family and palliative care, given her poor prognosis, she was made full comfort care and the plan is for discharge to residential hospice.    Discharge Diagnoses:  Principal Problem:   Toxic metabolic  encephalopathy Active Problems:   DM2 (diabetes mellitus, type 2) ([place])   Hypertension   Goals of care, counseling/discussion   Hypoglycemia   Benign hypertensive heart and kidney disease with diastolic CHF, NYHA class 3 and CKD stage 3 (HCC)   Sacral decubitus ulcer, stage IV (HCC)   Sepsis (HCC)   Catheter-associated urinary tract infection ([place])   Hypothermia   Decubitus ulcer of right heel, stage 3 (HCC)   Anasarca   Protein-calorie malnutrition, severe   Hypoalbuminemia   Severe anemia    Discharge Instructions  Discharge Instructions    Diet general   Complete by: As directed    No wound care   Complete by: As directed      Allergies as of 05/21/2020      Reactions   Doxycycline    Made her stopped eating    Other Other (See Comments)   NO "blood products," as the patient is a Sales promotion account executive Witness      Medication List    STOP taking these medications   acetaminophen 325 MG tablet Commonly known as: TYLENOL   aspirin 81 MG EC tablet   atorvastatin 80 MG tablet Commonly known as: LIPITOR   clopidogrel 75 MG tablet Commonly known as: PLAVIX   Darbepoetin Alfa 40 MCG/0.4ML Sosy injection Commonly known as: ARANESP   Decubi-Vite Caps   diclofenac Sodium 1 % Gel Commonly known as: VOLTAREN   DULoxetine 30 MG capsule Commonly known as: CYMBALTA   Ensure   ertapenem 500 mg in sodium chloride 0.9 % 50 mL   feeding supplement (PRO-STAT SUGAR FREE 64) Liqd   ferumoxytol 510 mg in sodium chloride 0.9 % 100 mL   lactulose 10 GM/15ML  solution Commonly known as: CHRONULAC   melatonin 3 MG Tabs tablet   metoprolol tartrate 25 MG tablet Commonly known as: LOPRESSOR   mirtazapine 15 MG tablet Commonly known as: REMERON   NovoLOG FlexPen 100 UNIT/ML FlexPen Generic drug: insulin aspart   ondansetron 4 MG tablet Commonly known as: ZOFRAN   white petrolatum Gel Commonly known as: VASELINE   Xultophy 100-3.6 UNIT-MG/ML Sopn Generic drug: Insulin  Degludec-Liraglutide       Allergies  Allergen Reactions  . Doxycycline     Made her stopped eating   . 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," as the patient is a Jehovah's Witness    Consultations:  ID, palliative care   Procedures/Studies: MR Lumbar Spine W Wo Contrast  Result Date: 05/15/2020 CLINICAL DATA:  Epidural abscess. EXAM: MRI LUMBAR SPINE WITHOUT AND WITH CONTRAST TECHNIQUE: Multiplanar and multiecho pulse sequences of the lumbar spine were obtained without and with intravenous contrast. CONTRAST:  28mL GADAVIST GADOBUTROL 1 MMOL/ML IV SOLN COMPARISON:  None. FINDINGS: Segmentation:  Standard. Alignment:  Physiologic. Vertebrae:  No fracture, evidence of discitis, or bone lesion. Conus medullaris and cauda equina: Conus extends to the L1 level. Conus and cauda equina appear normal. Paraspinal and other soft tissues: Diffuse edema of the posterior subcutaneous with soft tissue thickening in the sacrococcygeal region. This is only partially seen on sagittal views. Disc levels: T12-L1: No spinal canal or neural foraminal stenosis. L1-2: No spinal canal or neural foraminal stenosis. L2-3: No spinal canal or neural foraminal stenosis. L3-4: Shallow disc bulge and mild facet degenerative changes without significant spinal canal or neural foraminal stenosis. L4-5: Loss of disc height, disc bulge, mild facet degenerative change ligamentum flavum redundancy resulting in mild spinal canal stenosis with narrowing of the bilateral subarticular zones and moderate bilateral neural foraminal narrowing. L4-5: Loss of disc height, narrowing of the right subarticular zone and mild right neural foraminal narrowing. IMPRESSION: 1. No evidence of and epidural abscess. 2. Soft tissue thickening in the sacrococcygeal region, which is only partially seen on sagittal views. 3. Mild degenerative changes at L4-5 and L4-5 with mild spinal canal stenosis with narrowing of the bilateral subarticular  zones and moderate bilateral neural foraminal narrowing at L4-5. Electronically Signed   By: Pedro Earls M.D.   On: 05/15/2020 16:28   MR FOOT RIGHT W WO CONTRAST  Addendum Date: 05/11/2020   ADDENDUM REPORT: 05/11/2020 19:41 ADDENDUM: The original report was by Dr. Van Clines. The following addendum is by Dr. Van Clines: Please note that there is also some edema in the abductor hallucis and flexor digitorum brevis muscles which is probably neurogenic and less likely to be due to myositis. Electronically Signed   By: Van Clines M.D.   On: 05/11/2020 19:41   Result Date: 05/11/2020 CLINICAL DATA:  Nonhealing ulcer along the plantar heel. EXAM: MRI OF THE RIGHT HINDFOOT WITHOUT AND WITH CONTRAST TECHNIQUE: Multiplanar, multisequence MR imaging of the ankle was performed before and after the administration of intravenous contrast. CONTRAST:  6 cc Gadavist COMPARISON:  CT of the right foot from 04/03/2020 FINDINGS: TENDONS Peroneal: Mild peroneus longus and brevis tenosynovitis. Posteromedial: Mild tibialis posterior tenosynovitis distally. Anterior: Unremarkable Achilles: Mild distal Achilles tendinopathy. Plantar Fascia: Thickened medial band of the plantar fascia with surrounding edema compatible with plantar fasciitis. LIGAMENTS Lateral: Unremarkable Medial: Unremarkable CARTILAGE Ankle Joint: Unremarkable Subtalar Joints/Sinus Tarsi: Trace effusion of the posterior subtalar facet. Bones: Beneath the severe ulceration along the posterior  heel, there are bands of enhancement within the marrow for example on image 13 of series 15. One atypical feature is the low STIR signal within these bands, which could indicate fibrosis rather than necessarily infection. These bands were not readily appreciable on the CT from 04/03/2020. No overt bony destruction is identified. Please note that today's examination was centered on the hindfoot and does not include the distal metatarsals  and toes. Other: Prominent ulceration along the posterior calcaneus. Subcutaneous edema and low-grade enhancement medial and lateral to the ankle potentially reflecting cellulitis. IMPRESSION: IMPRESSION 1. Prominent ulceration along the posterior heel with bands of enhancement within the marrow, but no overt bony destruction. Although suspicious for early osteomyelitis, there are some atypical features such as reduced rather than accentuated STIR signal. 2. Plantar fasciitis. 3. Mild tenosynovitis of the peroneal tendons and tibialis posterior. 4. Mild distal Achilles tendinopathy. 5. Trace effusion of the posterior subtalar facet. 6. Subcutaneous edema and low-grade enhancement medial and lateral to the ankle potentially reflecting cellulitis. 7. Please note that today's examination was centered on the hindfoot and does not include the distal metatarsals and toes. Electronically Signed: By: Van Clines M.D. On: 05/11/2020 18:35   IR Removal Tun Cv Cath W/O FL  Result Date: 05/16/2020 INDICATION: Patient with history of osteomyelitis in need of durable IV access for long-term IV antibiotic use s/p tunneled right IJ CVC placement in IR 04/11/2020. Patient no longer requiring IV antibiotics. Request is made for removal of tunneled CVC. EXAM: REMOVAL OF TUNNELED CENTRAL VENOUS CATHETER MEDICATIONS: 6 mL 1% lidocaine COMPLICATIONS: None immediate. PROCEDURE: Informed written consent was obtained from the patient's daughter following an explanation of the procedure, risks, benefits and alternatives to treatment. A time out was performed prior to the initiation of the procedure. Maximal barrier sterile technique was utilized including mask, sterile gowns, sterile gloves, hand hygiene, and Hibiclens. 1% lidocaine was injected under sterile conditions along the subcutaneous tunnel. Utilizing a combination of blunt dissection and gentle traction, the catheter was removed intact. Hemostasis was obtained with  manual compression. A dressing was placed. The patient tolerated the procedure well without immediate post procedural complication. IMPRESSION: Successful removal of tunneled central venous catheter. Read by: Earley Abide, PA-C Electronically Signed   By: Aletta Edouard M.D.   On: 05/16/2020 15:58   DG CHEST PORT 1 VIEW  Result Date: 05/14/2020 CLINICAL DATA:  Shortness of breath, altered mental status, history hypertension, CHF, stroke, coronary artery disease, diabetes mellitus EXAM: PORTABLE CHEST 1 VIEW COMPARISON:  Portable exam 1840 hours compared to 05/13/2020 FINDINGS: RIGHT jugular line with tip projecting over cavoatrial junction. Normal heart size post TAVR and CABG. Atherosclerotic calcification aorta. Mediastinal contours and pulmonary vascularity normal. Lungs clear. No pulmonary infiltrate, pleural effusion or pneumothorax. Bones demineralized. IMPRESSION: No acute abnormalities. Electronically Signed   By: Lavonia Dana M.D.   On: 05/14/2020 20:42   DG CHEST PORT 1 VIEW  Result Date: 05/13/2020 CLINICAL DATA:  Fever EXAM: PORTABLE CHEST 1 VIEW COMPARISON:  05/09/2020 FINDINGS: Prior TAVR and CABG. Heart is normal size. Tortuous ectatic thoracic aorta. No confluent airspace opacities or effusions. Right internal jugular central line is unchanged. Feeding tube is seen entering the stomach and projects off the lower aspect of the film. IMPRESSION: Tortuous, ectatic aorta. No acute cardiopulmonary disease. Electronically Signed   By: Rolm Baptise M.D.   On: 05/13/2020 15:35   DG Chest Port 1 View  Result Date: 05/09/2020 CLINICAL DATA:  Lethargy and altered mental status.  Questionable sepsis - evaluate for abnormality EXAM: PORTABLE CHEST 1 VIEW COMPARISON:  Radiograph 02/08/2020 FINDINGS: Right internal jugular central venous catheter tip at the atrial caval junction. Post median sternotomy and TAVR. Implanted loop recorder projects over the left chest wall. Lung volumes are low. Mild  streaky atelectasis at the left greater than right lung base. No confluent consolidation. Stable heart size. Unchanged mediastinal contours with aortic atherosclerosis and tortuosity. Chronic cardiomegaly. No pulmonary edema. No large pleural effusion or pneumothorax. IMPRESSION: Low lung volumes with streaky atelectasis at the left greater than right lung base. Electronically Signed   By: Keith Rake M.D.   On: 05/09/2020 19:59   DG Abd Portable 1V  Result Date: 05/11/2020 CLINICAL DATA:  Status post feeding tube placement. EXAM: PORTABLE ABDOMEN - 1 VIEW COMPARISON:  None. FINDINGS: Feeding tube is in place with the tip at the ligament of Treitz. IMPRESSION: Feeding tube in good position. Electronically Signed   By: Inge Rise M.D.   On: 05/11/2020 13:22   VAS [place] UPPER EXTREMITY VENOUS DUPLEX  Result Date: 05/13/2020 UPPER VENOUS STUDY  Indications: Swelling Risk Factors: None identified. Anticoagulation: Lovenox. Limitations: Patient cooperation and positioning. Comparison Study: No previous Upper venous duplex Performing Technologist: Vonzell Schlatter RVT  Examination Guidelines: A complete evaluation includes B-mode imaging, spectral Doppler, color Doppler, and power Doppler as needed of all accessible portions of each vessel. Bilateral testing is considered an integral part of a complete examination. Limited examinations for reoccurring indications may be performed as noted.  Left Findings: +----------+------------+---------+-----------+----------+--------------+ LEFT      CompressiblePhasicitySpontaneousProperties   Summary     +----------+------------+---------+-----------+----------+--------------+ IJV           Full       Yes       Yes                             +----------+------------+---------+-----------+----------+--------------+ Subclavian               Yes       Yes                              +----------+------------+---------+-----------+----------+--------------+ Axillary                 Yes       Yes                             +----------+------------+---------+-----------+----------+--------------+ Brachial      Full       Yes       Yes                             +----------+------------+---------+-----------+----------+--------------+ Radial        Full                                                 +----------+------------+---------+-----------+----------+--------------+ Ulnar                                               Not visualized +----------+------------+---------+-----------+----------+--------------+ Cephalic  Full                                                 +----------+------------+---------+-----------+----------+--------------+ Basilic                                             Not visualized +----------+------------+---------+-----------+----------+--------------+  Summary:  Right: However, unable to visualize the Unable to see Ulnar or Basilic due to patient cooperation for positioning. Unable to follow directions.  Left: No evidence of deep vein thrombosis in the upper extremity. No evidence of superficial vein thrombosis in the upper extremity.  *See table(s) above for measurements and observations.  Diagnosing physician: Ruta Hinds MD Electronically signed by Ruta Hinds MD on 05/13/2020 at 11:17:29 AM.    Final       Subjective: Patient seen and examined at bedside this morning.  Looks weak, little more alert than yesterday, was complaining of some headache.  Confused  Discharge Exam: Vitals:   05/20/20 0610 05/21/20 0539  BP: 134/65 131/70  Pulse: 61 81  Resp: 15 18  Temp:  98.5 F (36.9 C)  SpO2: (!) 74% 99%   Vitals:   05/17/20 2039 05/18/20 2138 05/20/20 0610 05/21/20 0539  BP: 128/61 123/81 134/65 131/70  Pulse: 85 82 61 81  Resp: $Remo'15 16 15 18  'ZFpBa$ Temp: 97.8 F (36.6 C) 97.8 F (36.6 C)  98.5 F  (36.9 C)  TempSrc: Oral Oral  Oral  SpO2: 100% 100% (!) 74% 99%  Weight:      Height:        General: Weak, bedbound, chronically looking  cardiovascular: RRR, S1/S2 +, no rubs, no gallops Respiratory: Diminished air sounds bilaterally, no wheezes or crackles Abdominal: Soft, NT, ND, bowel sounds + Extremities: Ulcer on the lower back, right heel    The results of significant diagnostics from this hospitalization (including imaging, microbiology, ancillary and laboratory) are listed below for reference.     Microbiology: Recent Results (from the past 240 hour(s))  Culture, blood (routine x 2)     Status: None   Collection Time: 05/13/20  1:12 PM   Specimen: BLOOD  Result Value Ref Range Status   Specimen Description BLOOD RIGHT ANTECUBITAL  Final   Special Requests   Final    BOTTLES DRAWN AEROBIC ONLY Blood Culture results may not be optimal due to an inadequate volume of blood received in culture bottles   Culture   Final    NO GROWTH 5 DAYS Performed at [facility], [place] [address] [place], [place] [address]    Report Status 05/18/2020 FINAL  Final  Culture, blood (routine x 2)     Status: None   Collection Time: 05/13/20  1:29 PM   Specimen: BLOOD RIGHT HAND  Result Value Ref Range Status   Specimen Description BLOOD RIGHT HAND  Final   Special Requests   Final    BOTTLES DRAWN AEROBIC ONLY Blood Culture results may not be optimal due to an inadequate volume of blood received in culture bottles   Culture   Final    NO GROWTH 5 DAYS Performed at [facility], [place] [address] [place], [place] [address]    Report Status 05/18/2020 FINAL  Final  Labs: BNP (last 3 results) Recent Labs    06/24/19 1605 07/17/19 2150  BNP 430.7* 403.4*   Basic Metabolic Panel: Recent Labs  Lab 05/15/20 0500 05/17/20 0100  NA 142 143  K 5.0 5.4*  CL 111 117*  CO2 25 21*  GLUCOSE 194* 173*  BUN 31* 43*  CREATININE 1.25* 1.19*  CALCIUM 7.4* 7.3*  PHOS 3.6   --    Liver Function Tests: Recent Labs  Lab 05/17/20 0100  AST 28  ALT 18  ALKPHOS 179*  BILITOT 0.5  PROT 4.6*  ALBUMIN 1.1*   No results for input(s): LIPASE, AMYLASE in the last 168 hours. No results for input(s): AMMONIA in the last 168 hours. CBC: Recent Labs  Lab 05/15/20 0500 05/16/20 0500 05/17/20 0100  WBC 12.4* 12.0* 11.4*  NEUTROABS 8.5* 8.9* 8.3*  HGB 6.2* 5.6* 5.6*  HCT 18.4* 17.3* 17.6*  MCV 94.8 95.6 98.9  PLT 158 171 176   Cardiac Enzymes: No results for input(s): CKTOTAL, CKMB, CKMBINDEX, TROPONINI in the last 168 hours. BNP: Invalid input(s): POCBNP CBG: Recent Labs  Lab 05/20/20 0605 05/20/20 1932 05/20/20 2347 05/21/20 0537 05/21/20 0629  GLUCAP 72 89 81 67* 69*   D-Dimer No results for input(s): DDIMER in the last 72 hours. Hgb A1c No results for input(s): HGBA1C in the last 72 hours. Lipid Profile No results for input(s): CHOL, HDL, LDLCALC, TRIG, CHOLHDL, LDLDIRECT in the last 72 hours. Thyroid function studies No results for input(s): TSH, T4TOTAL, T3FREE, THYROIDAB in the last 72 hours.  Invalid input(s): FREET3 Anemia work up No results for input(s): VITAMINB12, FOLATE, FERRITIN, TIBC, IRON, RETICCTPCT in the last 72 hours. Urinalysis    Component Value Date/Time   COLORURINE YELLOW 05/09/2020 1906   APPEARANCEUR CLOUDY (A) 05/09/2020 1906   APPEARANCEUR Clear 09/01/2018 1131   LABSPEC 1.011 05/09/2020 1906   PHURINE 5.0 05/09/2020 1906   GLUCOSEU NEGATIVE 05/09/2020 1906   GLUCOSEU NEG mg/dL 12/25/2006 2041   HGBUR MODERATE (A) 05/09/2020 1906   HGBUR trace-lysed 03/05/2010 0856   BILIRUBINUR NEGATIVE 05/09/2020 1906   BILIRUBINUR Negative 09/01/2018 1131   KETONESUR NEGATIVE 05/09/2020 1906   PROTEINUR 30 (A) 05/09/2020 1906   UROBILINOGEN 0.2 11/11/2011 1701   UROBILINOGEN 0.2 11/11/2011 1645   NITRITE NEGATIVE 05/09/2020 1906   LEUKOCYTESUR LARGE (A) 05/09/2020 1906   Sepsis Labs Invalid input(s):  PROCALCITONIN,  WBC,  LACTICIDVEN Microbiology Recent Results (from the past 240 hour(s))  Culture, blood (routine x 2)     Status: None   Collection Time: 05/13/20  1:12 PM   Specimen: BLOOD  Result Value Ref Range Status   Specimen Description BLOOD RIGHT ANTECUBITAL  Final   Special Requests   Final    BOTTLES DRAWN AEROBIC ONLY Blood Culture results may not be optimal due to an inadequate volume of blood received in culture bottles   Culture   Final    NO GROWTH 5 DAYS Performed at [facility], [place] [address] [place], [place] [address]    Report Status 05/18/2020 FINAL  Final  Culture, blood (routine x 2)     Status: None   Collection Time: 05/13/20  1:29 PM   Specimen: BLOOD RIGHT HAND  Result Value Ref Range Status   Specimen Description BLOOD RIGHT HAND  Final   Special Requests   Final    BOTTLES DRAWN AEROBIC ONLY Blood Culture results may not be optimal due to an inadequate volume of blood received in culture bottles  Culture   Final    NO GROWTH 5 DAYS Performed at [facility], [place] [address], [place], [place] [address]    Report Status 05/18/2020 FINAL  Final    Please note: You were cared for by a hospitalist during your hospital stay. Once you are discharged, your primary care physician will handle any further medical issues. Please note that NO REFILLS for any discharge medications will be authorized once you are discharged, as it is imperative that you return to your primary care physician (or establish a relationship with a primary care physician if you do not have one) for your post hospital discharge needs so that they can reassess your need for medications and monitor your lab values.    Time coordinating discharge: 40 minutes  SIGNED:   Shelly Coss, MD  [facility] 05/21/2020, 11:32 AM Pager [phone]  If 7PM-7AM, please contact night-coverage [email or web address] Password [email or web address]

## 2020-05-18 NOTE — Progress Notes (Signed)
Manufacturing engineer [facility]) Hospital Liaison note.    Received request from [place] for family interest in [facility]. Chart and pt information under review by [facility] physician.  Hospice eligibility pending at this time.  [facility] is unable to offer a room today. Hospital Liaison will follow up tomorrow or sooner if a room becomes available. Please do not hesitate to call with questions.    Thank you for the opportunity to participate in this patient's care.  Chrislyn Edison Pace, BSN, RN [place] (listed on [place] under Hospice/[facility])    [phone]

## 2020-05-19 LAB — GLUCOSE, CAPILLARY
Glucose-Capillary: 72 mg/dL (ref 70–99)
Glucose-Capillary: 77 mg/dL (ref 70–99)

## 2020-05-19 NOTE — Progress Notes (Signed)
Patient seen and examined the bedside this morning.  Daughter was at the bedside.  She looks more alert and awake today.  Denies any complaints.  She was talking to her daughter. There is no change in the medical management.  She is waiting for bed at residential hospice.  Discharge summary and orders have already been placed.

## 2020-05-19 NOTE — TOC Progression Note (Addendum)
Transition of Care [facility]) - Progression Note    Patient Details  Name: [name] MRN: [id number] Date of Birth: October 28, 1945  [facility] of [facility] CM/SW [place], [place] Phone Number: 05/19/2020, 10:53 AM  Clinical Narrative:      Update- CSW received message from [place] with [facility] that patients family is all not in agreement with [facility]. Family is going to discuss tonight and CSW will follow up with [facility] tomorrow to confirm what the DC plan is . [facility] with hospice.  CSW will continue to follow. CSW just heard back from [place] with [facility]. No bed availability today at [facility]. CSW will follow up with [facility] with [facility] in the morning for bed availability for patient. CSW to continue to follow and assist with discharge planning needs.    Expected Discharge Plan: [place] Barriers to Discharge: Continued Medical Work up  Expected Discharge Plan and Services Expected Discharge Plan: [place] Choice: Hospice Living arrangements for the past 2 months: [place] Expected Discharge Date: 05/18/20                                     Social Determinants of Health (SDOH) Interventions    Readmission Risk Interventions No flowsheet data found.

## 2020-05-19 NOTE — Progress Notes (Signed)
[facility] ([facility])  There is not a bed to offer at [facility] today.  [facility] will update TOC and family once our bed status changes.  Venia Carbon RN, BSN, [place] [facility] Hospital Liaison

## 2020-05-19 NOTE — Progress Notes (Addendum)
Manufacturing engineer (ACC)  Met with daughter and patient at the bedside. Daughter requested we not discuss hospice in the presence of the patient.   Advised that we did have a bed today after all at [facility]. [name] advised there are 8 siblings and half agree with going to [facility] and half do not.   Patient is alert and oriented to self, place and time. She ate "most of her breakfast" and her appetite is "picking back up" per [place].   Since there in not an agreement between the siblings, we will not proceed with moving her to [facility] today. Advised her dtr that we can see if her apparent improvement is just a temporarily rally or if she is clinically somewhat improved. As she is eating and drinking and more interactive today, along with the families indecisiveness, she is not appropriate for [employer] today.   Advised her daughter we can check back with them on Sunday to see if a family decision has been made. If they cannot agree, we are happy to serve her at [facility] with hospice.  Thank you, Venia Carbon RN, BSN, [facility] Liaison

## 2020-05-20 DIAGNOSIS — Z66 Do not resuscitate: Secondary | ICD-10-CM | POA: Diagnosis not present

## 2020-05-20 DIAGNOSIS — Z7189 Other specified counseling: Secondary | ICD-10-CM | POA: Diagnosis not present

## 2020-05-20 DIAGNOSIS — Z515 Encounter for palliative care: Secondary | ICD-10-CM | POA: Diagnosis not present

## 2020-05-20 LAB — GLUCOSE, CAPILLARY
Glucose-Capillary: 72 mg/dL (ref 70–99)
Glucose-Capillary: 81 mg/dL (ref 70–99)
Glucose-Capillary: 89 mg/dL (ref 70–99)

## 2020-05-20 NOTE — Progress Notes (Signed)
Family at bedside, pt denies any pain.  Dressing with Santyl to sacral pressure injury and right heel completed.  Peri and foley care done and lotion applied with warm cloths. Pt did eat about 50% of a late lunch fed by family.

## 2020-05-20 NOTE — TOC Progression Note (Signed)
Transition of Care [facility]) - Progression Note    Patient Details  Name: [name] MRN: [id number] Date of Birth: Jul 04, 1945  Transition of Care [facility]) CM/SW [place], [place] Phone Number: 05/20/2020, 1:03 PM  Clinical Narrative:     CSW confirmed plan with Bevely Palmer with [facility] that plan is for patient to go home with hospice. [facility] is working on DME to be  delivered to home.CSW awaiting confirmation from [facility] that equipment has been delivered. CSW continues to follow and assist with discharge planning needs.    Expected Discharge Plan: [place] Barriers to Discharge: Continued Medical Work up  Expected Discharge Plan and Services Expected Discharge Plan: [place] Choice: Hospice Living arrangements for the past 2 months: [place] Expected Discharge Date: 05/18/20                                     Social Determinants of Health (SDOH) Interventions    Readmission Risk Interventions No flowsheet data found.

## 2020-05-20 NOTE — Progress Notes (Signed)
Manufacturing engineer [facility]) Hospital Liaison: RN note    Notified by Transition of [place], CSW  of patient/family request for [facility] services at home after discharge instead of residential hospice at [facility] Chart and patient information  reviewed by [facility] physician. Hospice eligibility confirmed.    Writer spoke with daughter, Rechel to initiate education related to hospice philosophy, services and team approach to care. Rechel  verbalized understanding of information given. Per discussion, plan is for discharge to home by PTAR.   Please send signed and completed DNR form home with patient/family. Patient will need prescriptions for discharge comfort medications.     DME needs have been discussed. Patient/family requests the following DME for delivery to the home: hospital bed and table. [facility] equipment manager has been notified and will contact DME provider to arrange delivery to the home. Home address has been verified as [place] [address] [address] Rechel   is the family member to contact to arrange time of delivery.     [facility] Referral Center aware of the above. Please notify ACC when patient is ready to leave the unit at discharge. (Call [phone] or [phone] after 5pm.) ACC information and contact numbers given to Rechel.      A Please do not hesitate to call with questions.   Thank you,   Farrel Gordon, RN, [place] (listed on [facility] under [place])    [phone]

## 2020-05-20 NOTE — Progress Notes (Addendum)
Patient seen and examined the bedside this morning.  Looks lethargic today.  Daughter was at the bedside.  Family has decided not to send her to residential hospice but instead want her to go to home with hospice. There is no change in the medical management.  We will continue comfort care until she is discharged.  Palliative care closely following.  Now we are working on discharge for hospice at home.  She will be discharged after equipment are delivered/daughter stating that she needs few days to prepare to get her at home.

## 2020-05-20 NOTE — Plan of Care (Signed)
  Problem: Clinical Measurements: Goal: Quality of life will improve Outcome: Progressing
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
## 2020-05-20 NOTE — Progress Notes (Signed)
Family at bedside, pt states she is not hungry, thirsty or in pain at present, looks comfortable.

## 2020-05-21 LAB — GLUCOSE, CAPILLARY
Glucose-Capillary: 67 mg/dL — ABNORMAL LOW (ref 70–99)
Glucose-Capillary: 69 mg/dL — ABNORMAL LOW (ref 70–99)
Glucose-Capillary: 90 mg/dL (ref 70–99)
Glucose-Capillary: 70 mg/dL (ref 70–99)

## 2020-05-21 NOTE — Progress Notes (Signed)
Patient seen and examined the bedside this morning.  Hemodynamically stable.  Daughter was at the bedside.  She is stable for discharge to residential hospice as soon as bed is available.  Discharge summary and orders have already been done.  No change in the medical management.

## 2020-05-21 NOTE — Progress Notes (Signed)
Nutrition Brief Note  Chart reviewed. Cortrak discontinued on 3/25 Pt comfort care only, orders for discharge to residential hospice. PO diet for comfort No further nutrition interventions warranted at this time.  Please re-consult as needed.   Kerman Passey MS, RDN, LDN, CNSC Registered Dietitian III Clinical Nutrition RD Pager and On-Call Pager Number Located in [place]

## 2020-05-21 NOTE — Plan of Care (Signed)
  Problem: Clinical Measurements: Goal: Cardiovascular complication will be avoided Outcome: Progressing   

## 2020-05-21 NOTE — Progress Notes (Addendum)
Manufacturing engineer [facility])  We will meet family and discuss in person what [facility] offers with family. TOC aware as well as PMT.Sorry for the confusion and thanks for all your help.  [facility]. Sergent [facility]. ACC MD has approved this patient for transfer today by PTAR.  Consents will be signed by Rechel. ACC SW will complete. This liaison will let TOC know once completed and when to notify transport.   Please call report to RN at [facility] place [phone]. Please fax discharge paper work to [phone].  Please send signed and completed DNR forms.   Clementeen Hoof, BSN, [employer] [phone]

## 2020-05-21 NOTE — Progress Notes (Signed)
Palliative Medicine Inpatient Follow Up Note  Palliative Care consult requested for goals of care discussion in this74 y.o.femalewith multiple medical problems including breast CA s/p mastectomy(90's) &chemotherapy, CVA, AS s/p AVR(2006), TAVR(2021),hypertension, CKD 3, CHF, CAD,right lower extremity ischemia status post angiography(12/2019),andinsulin dependent diabetes. She presented to the ED from [facility] facility with complaints of altered mental status. Followed by wound care outpatient due to sacral and heel decubitus. Patient is a Restaurant manager, fast food. Patient's blood glucose 29 on EMS arrival. Patient was hypothermic (95.6) and hypotensive (80s) on arrival. Patient initially placed on Bair hugger, D10 drip, and IV antibiotics. UA positive for UTI. Since admission appetite remains poor and family has requested trial period of Coretrak for tube feeding.  Coretrack was DC'd on 3/25 and patient was initiated on comfort focused care.  The plan had prior been for the patient to transfer to [place] (05/21/2020):  *Please note that this is a verbal dictation therefore any spelling or grammatical errors are due to the "[place] One" system interpretation.  I met with [name] and her daughter, [name] at bedside. [name] stated that the family had a zoom meeting last night. We reviewed that the plan had now changed and as I understand it everyone in the family is in agreement with [facility]. [name] confirmed this and said there had been one family member of the eight who had been in opposition to this plan though she now was in agreement. We discussed proceeding with placement at [facility].  [name] was bright eyed this morning and was able to ask me some questions. [name] asked me what medications she would be on at [facility]. We reviewed that the medications which she will all be on will be geared towards comfort and no longer with  the emphasis of treatment. We reviewed that sometimes patients oddly enough feel better and improve after stopping all of the polypharmacy.  Questions and concerns addressed  ______________________________________________________________________________________ Addendum:  Reviewed messaging from the case management team that patient family is now wishing for her to not go to [facility] as there is disagreement regarding medications. [name], patients daughter would like to take her home.   I have left her a voicemail to gain a better understanding of her concerns. She expresses concerns about stopping certain medications such as anti-hypertensive's, antidepressants, and insulin . I educated her on the comfort focus which we presently have and that [name] is encroaching on her final phases of life. I shared that these medications are not at this point going to greatly benefit her. She appeared to understand this. We reviewed that at [facility] place their emphasis too is comfort, if she exceeds two weeks of life then she should go home on home hospice.  _______________________________________________________________________________________ Addendum #2:  There appears to be tremendous discord among family members. [name] is not in agreement with [facility] emphasis and appears to believe it is a hold steady until the patient can go home with hospice. She again requests further information on [name]'s medications to be resumed and that she see her outside doctors after [employer].   Given the lack of acceptance of the hospice emphasis. [name] is no longer in agreement with allowing [name] into their facility.  ________________________________________________________________________________________ Addendum #3:  I called patient sister [name] and shared with her the concerns the medical team has in regards to placement at [facility]. [name] shares that as of last night they were all in  agreement  for [facility] and to her knowledge they were in agreement this morning. She expresses frustration with her sisters actions that the plan has changed. She shares that as far as she knows her mother is at the end of her life and the emphasis at this point is to keep her comfortable.  ________________________________________________________________________________________ Addendum #4  Per conversation with CM - family in agreement with placement at [facility]. Await confirmation to [facility] in terms of if the would consider accepting the patient now.    Objective Assessment: Vital Signs Vitals:   05/20/20 0610 05/21/20 0539  BP: 134/65 131/70  Pulse: 61 81  Resp: 15 18  Temp:  98.5 F (36.9 C)  SpO2: (!) 74% 99%    Intake/Output Summary (Last 24 hours) at 05/21/2020 1329 Last data filed at 05/21/2020 3343 Gross per 24 hour  Intake 480 ml  Output 500 ml  Net -20 ml   Last Weight  Most recent update: 05/17/2020  5:08 AM   Weight  87.5 kg (193 lb)           Gen:  Elderly Ill appearing AA F HEENT: moist mucous membranes CV: Regular rate and rhythm  PULM: On RA ABD: soft/nontender  EXT: Generalized edema Neuro: Somnolent  SUMMARY OF RECOMMENDATIONS   DNAR/DNI  Continue comfort focused care  Await confirmation of [facility] bed  Ongoing PMT support  Time Spent: 120 Greater than 50% of the time was spent in counseling and coordination of care ______________________________________________________________________________________ [place] Team Team Cell Phone: [phone] Please utilize secure chat with additional questions, if there is no response within 30 minutes please call the above phone number  Palliative Medicine Team providers are available by phone from 7am to 7pm daily and can be reached through the team cell phone.  Should this patient require assistance outside of these hours, please call the patient's attending  physician.

## 2020-05-21 NOTE — Care Management (Addendum)
PTAR paperwork in chart. Once [employer] ready . Nurse will call report and NCM will call [place] to [place] with [facility] ( [place]), when [place] worker reached out to daughter Rechel for consents , Rechel was unsure if they wanted [facility] or home with hospice. Melissa left Rechel a message and will try her again.    1500 NCM spoke to [place]. Both told NCM they support end of life care. Olivia Mackie with [employer] would like to meet with them and any other family members who are available in person at the hospital , to be sure they understand end of life care and [facility].   NCM spoke to [place] she is aware of above she will call Rechel and call NCM back with a time to see Olivia Mackie tomorrow am.  [place] will met with family tomorrow morning at 0900 at hospital.  [place]

## 2020-05-21 NOTE — Consult Note (Signed)
   [facility] Inpatient Consult   05/21/2020  [name] 06-10-1945 [id number]  [place] [facility] [ACO] Patient: Medicare DCE   Patient screened for hospitalization with noted less than 3 days for unplanned readmission and  to assess for potential [place] Management service needs for post hospital transition.  Review of patient's medical record reveals patient is for comfort measures disposition still ongoing at this time.  Plan:  Continue to follow progress and review for final decision on disposition to assess for post hospital care management needs.   For questions contact:   Natividad Brood, RN BSN [facility] Liaison  [phone] business mobile phone Toll free office [phone]  Fax number: [phone] [email or web address]

## 2020-05-21 NOTE — Progress Notes (Signed)
Patient's CBG 67, 120 ml of orange juice given. Rechecked CBG 69, another 120 ml of orange juice given. Will continue to monitor.

## 2020-05-22 LAB — GLUCOSE, CAPILLARY
Glucose-Capillary: 125 mg/dL — ABNORMAL HIGH (ref 70–99)
Glucose-Capillary: 79 mg/dL (ref 70–99)
Glucose-Capillary: 89 mg/dL (ref 70–99)

## 2020-05-22 NOTE — Progress Notes (Signed)
Lying in bed. Alert to person/place. Calm and cooperative. Denies pain, dizziness, shortness of breath. No s/s of any distress.  Awaiting PTAR to transport her home with family. Have not had an update from PTAR w/ an arrival time. [place] RN stated that PTAR supposed to arrived @ 2100. Continuing to monitor and will keep family updated.

## 2020-05-22 NOTE — Plan of Care (Signed)
  Problem: Education: Goal: Knowledge of General Education information will improve Description: Including pain rating scale, medication(s)/side effects and non-pharmacologic comfort measures Outcome: Adequate for Discharge   Problem: Health Behavior/Discharge Planning: Goal: Ability to manage health-related needs will improve Outcome: Adequate for Discharge   Problem: Clinical Measurements: Goal: Ability to maintain clinical measurements within normal limits will improve Outcome: Adequate for Discharge Goal: Will remain free from infection Outcome: Adequate for Discharge Goal: Diagnostic test results will improve Outcome: Adequate for Discharge Goal: Cardiovascular complication will be avoided Outcome: Adequate for Discharge   Problem: Activity: Goal: Risk for activity intolerance will decrease Outcome: Adequate for Discharge   Problem: Nutrition: Goal: Adequate nutrition will be maintained Outcome: Adequate for Discharge   Problem: Coping: Goal: Level of anxiety will decrease Outcome: Adequate for Discharge   Problem: Elimination: Goal: Will not experience complications related to bowel motility Outcome: Adequate for Discharge Goal: Will not experience complications related to urinary retention Outcome: Adequate for Discharge   Problem: Pain Managment: Goal: General experience of comfort will improve Outcome: Adequate for Discharge   Problem: Safety: Goal: Ability to remain free from injury will improve Outcome: Adequate for Discharge   Problem: Skin Integrity: Goal: Risk for impaired skin integrity will decrease Outcome: Adequate for Discharge   Problem: Malnutrition  (NI-5.2) Goal: Food and/or nutrient delivery Description: Individualized approach for food/nutrient provision. Outcome: Adequate for Discharge   Problem: Education: Goal: Knowledge of the prescribed therapeutic regimen will improve Outcome: Adequate for Discharge   Problem: Coping: Goal:  Ability to identify and develop effective coping behavior will improve Outcome: Adequate for Discharge   Problem: Clinical Measurements: Goal: Quality of life will improve Outcome: Adequate for Discharge   Problem: Respiratory: Goal: Verbalizations of increased ease of respirations will increase Outcome: Adequate for Discharge   Problem: Role Relationship: Goal: Family's ability to cope with current situation will improve Outcome: Adequate for Discharge Goal: Ability to verbalize concerns, feelings, and thoughts to partner or family member will improve Outcome: Adequate for Discharge   Problem: Pain Management: Goal: Satisfaction with pain management regimen will improve Outcome: Adequate for Discharge   Problem: Clinical Measurements: Goal: Quality of life will improve Outcome: Adequate for Discharge

## 2020-05-22 NOTE — Progress Notes (Signed)
[facility]) Hospital Liaison RN note  Met with daughters [name] and Rechel this morning to discuss hospice care. Rechel shares that her mother has expressed to her and another sister, that she wishes to live.Tthey desire aggressive treatment, care and services for their mother. They would want her to return to the hospital if needed, treatment of infections, resumption of prior medications and for her to see her regular doctors. Rechel is not interested in hospice care at this time. Decision was made for patient to return home with home health services, per Rechel's request and to which [name] agrees. They have requested for outpatient Palliative Care to follow at home. Dr. Tawanna Solo and [facility] Magdalen Spatz are aware.  Thank you, Margaretmary Eddy, BSN, RN [facility] Liaison [phone]

## 2020-05-22 NOTE — Progress Notes (Signed)
This chaplain responded to PMT consult for completing the Pt. HCPOA.    The chaplain competed [facility] education with the Pt. The Pt. documented [name] as her healthcare agent and [name] if the healthcare agent is unwilling or unable to serve in this role.    The notary is not available at the time of the visit. The HCPOA document was place in the Pt. closet in the belongings bag until notary and witnesses can be present.

## 2020-05-22 NOTE — Discharge Summary (Addendum)
Physician Discharge Summary  [name] CHY:[id number] DOB: 1946/02/15 DOA: 05/09/2020  PCP: Hendricks Limes, MD  Admit date: 05/09/2020 Discharge date: 05/22/2020  Admitted From: Home Disposition:  Home  Discharge Condition:Stable CODE STATUS:DNR Diet recommendation:  Regular  Brief/Interim Summary:  [name] a 75 y.o.femalewith medical history significant of breast CA in 1990's s/p mastectomy, chemotherapy, CVA, AS s/p AVR in 2006, then TAVR in 2021, HFpEF + CHB + PPM (placed 01/2019)-bacteremia group B strep 06/2019 status post PPM explantation and then subsequent Medtronic Micra, h/oright lower extremity ischemia status post angiography in 12/2019, h/o insulin dependent diabetes .She was admitted from 2/8 - 2/16 with critical ischemia of R leg and open ulcer of heel growing Morganella morganii on culture. Other issues included Sacral decubitus ulcer, and urinary retention.Patient was discharged on Invanz through March 25th She has FTT ,has been at nursing home for about a year, family noticed progressive decline for about 13-month with poor oral intake, weight loss of 30 to 40 pounds, has been bedbound, needing to be fed. She was brought to the ED from nursing home this time due to altered mental status, found to have hypoglycemia, blood glucose 29, hypothermia and hypotension. She was admitted for further management of acute metabolic encephalopathy possible sepsis from sacral decubitus ulcer/heel ulcer. She was started on broad-spectrum antibiotics.  Hospital course remarkable for persistent fever.  She had very poor oral intake and confusion.  She had to be started on tube feeding diet.  ID was following for osteomyelitis and she was on IV antibiotics but later did not recommend continuing antibiotics After several discussions with the family and palliative care, given her poor prognosis, she was made full comfort care and the plan was for discharge to residential  hospice.  She remained on comfort care for 6 days without any blood work, aggressive treatment/IV fluids or antibiotics.   She was waiting for residential hospice.  Palliative care was closely following.  Initially all the family members were interested in residential hospice but again the family became divided and they wanted to take her home with home health.  Family wants her to be discharged to home today, so respecting their  decision, we are discharging her to home.  Family will think about enrolling into hospice care after the patient is sent home.  Following problems were addressed during hospitalization:    Acute metabolic encephalopathy: Present on admission.  ABG done 05/13/2020 did not show any hypercarbia.    Currently she is alert and awake but confused  Suspected sepsis: Presented with hypothermia, hypotension, leukocytosis, lactic acidosis.  Source of infection suspected to be from  sacral decubitus ulcer/heel ulcer.  Right foot MRI showed  prominent ulceration along the posterior heel with bands of enhancement within the marrow,no overt bony destruction,suspicious for early osteomyelitis,subcutaneous edema and low-grade enhancement medial and lateral to the ankle potentially reflecting cellulitis. Indwelling tunneled catheter removed.  Blood cultures have not shown any growth.  Urine culture showed yeast.  Indwelling Foley catheter changed in the emergency department.  She was started on broad-spectrum antibiotics.  ID consulted and following and was  on Invanz  ID  ordered MRI of the lumbar spine to rule out deep abscess but it was found to be negative.  Antibiotics were discontinued because she was made comfort care and ID did not recommend further continuation of antibiotics  Acute on chronic normocytic anemia/thrombocytopenia: Hemoglobin dropped to 5.6.  Patient is Jehovah's Witness ,doesnot accept any blood products.  No evidence of active bleeding. Iron studies showed normal  iron  Hypoglycemia/history of type 2 diabetes: On presentation, she was hypoglycemic with blood sugar of 29.  She was initially given D10 infusion.  Also started on tube feeds.    Currently eating by mouth.  Insulin discontinued on discharge.  Her blood sugars are  Ar not high  History of hypertension: Presented with hypotension.  Blood pressure has normalized.   Antihypertensives on hold.  Hypothermia: Resolved.    CKD stage IIIa: Last  kidney function at baseline.  Monitor.  Avoid nephrotoxins.  History of peripheral vascular disease: She has history of right lower extremity  Critical ischemia status post angiography on 11/21.  Vascular surgery has recommended aspirin, Plavix indefinitely,statin.  History of hyperlipidemia: On atorvastatin.  History of CVA:H/o AS s/p AVR in 2006, then TAVR in 2021,  H/o HFpEF + CHB + PPM  H/o bacteremia group B strep 06/2019 status post PPM explantation and then subsequent Medtronic Micra placement On aspirin,Plavix/statin   History of breast cancer: Status post left mastectomy, chemotherapy.  Now in remission.  Severe protein calorie malnutrition/poor oral intake: Due to immobility, bedbound status.  History of 40 pound weight loss in the last 4 months.  Albumin less than 1.  Nutrition were following.    Goals of care: Elderly patient with multiple comorbidities.  Palliative care  consulted and following.  Remains full code.  Extensive discussion have been held about goals of care multiple times focusing on her poor prognosis, multiple comorbidities.  Family  Met  palliative care and decided on choosing comfort care/vaginal hospice.  Family again changed her mind and wanted to take her home.  We strongly recommend to follow-up with palliative care as an outpatient.   Discharge Diagnoses:  Principal Problem:   Toxic metabolic encephalopathy Active Problems:   DM2 (diabetes mellitus, type 2) ([facility])   Hypertension   Goals of care,  counseling/discussion   Hypoglycemia   Benign hypertensive heart and kidney disease with diastolic CHF, NYHA class 3 and CKD stage 3 (HCC)   Sacral decubitus ulcer, stage IV (HCC)   Sepsis (HCC)   Catheter-associated urinary tract infection (HCC)   Hypothermia   Decubitus ulcer of right heel, stage 3 (HCC)   Anasarca   Protein-calorie malnutrition, severe   Hypoalbuminemia   Severe anemia    Discharge Instructions  Discharge Instructions    Diet general   Complete by: As directed    Discharge instructions   Complete by: As directed    1)Please follow-up with palliative care as soon as possible as an outpatient. 2)Follow up with your PCP in a week   Discharge wound care:   Complete by: As directed    As wound care RN   Increase activity slowly   Complete by: As directed    No wound care   Complete by: As directed      Allergies as of 05/22/2020      Reactions   Doxycycline    Made her stopped eating    Other Other (See Comments)   NO "blood products," as the patient is a Jehovah's Witness      Medication List    STOP taking these medications   Decubi-Vite Caps   ertapenem 500 mg in sodium chloride 0.9 % 50 mL   metoprolol tartrate 25 MG tablet Commonly known as: LOPRESSOR   NovoLOG FlexPen 100 UNIT/ML FlexPen Generic drug: insulin aspart   white petrolatum Gel Commonly known as: VASELINE  Xultophy 100-3.6 UNIT-MG/ML Sopn Generic drug: Insulin Degludec-Liraglutide     TAKE these medications   acetaminophen 325 MG tablet Commonly known as: TYLENOL Take 650 mg by mouth daily. Not to exceed 3000 mg in 24 hour per stranding order   aspirin 81 MG EC tablet TAKE 1 TABLET (81 MG TOTAL) BY MOUTH DAILY. SWALLOW WHOLE. What changed:   how much to take  how to take this  when to take this  additional instructions   atorvastatin 80 MG tablet Commonly known as: LIPITOR Take 1 tablet (80 mg total) by mouth at bedtime.   clopidogrel 75 MG  tablet Commonly known as: PLAVIX Take 1 tablet (75 mg total) by mouth daily with breakfast. What changed: when to take this   Darbepoetin Alfa 40 MCG/0.4ML Sosy injection Commonly known as: ARANESP Inject 0.4 mLs (40 mcg total) into the skin once a week.   diclofenac Sodium 1 % Gel Commonly known as: VOLTAREN Apply 4 g topically 4 (four) times daily as needed (Pain). Apply to bilateral hips and knees   DULoxetine 30 MG capsule Commonly known as: CYMBALTA Take 30 mg by mouth daily.   Ensure Take 237 mLs by mouth in the morning and at bedtime. Strawberry if available   feeding supplement (PRO-STAT SUGAR FREE 64) Liqd Take 30 mLs by mouth in the morning and at bedtime.   ferumoxytol 510 mg in sodium chloride 0.9 % 100 mL Inject 510 mg into the vein once a week.   lactulose 10 GM/15ML solution Commonly known as: CHRONULAC Take 20 g by mouth daily.   melatonin 3 MG Tabs tablet Take 6 mg by mouth every evening.   mirtazapine 15 MG tablet Commonly known as: REMERON Take 15 mg by mouth at bedtime.   ondansetron 4 MG tablet Commonly known as: ZOFRAN Take 4 mg by mouth every 12 (twelve) hours as needed for nausea or vomiting.            Durable Medical Equipment  (From admission, onward)         Start     Ordered   05/22/20 1047  For home use only DME Other see comment  Once       Comments: Over the hospital bed table  Question:  Length of Need  Answer:  Lifetime   05/22/20 1047   05/22/20 1047  For home use only DME 3 n 1  Once        05/22/20 1047   05/22/20 1042  For home use only DME Hospital bed  Once       Comments: Call daughter Rechel [phone] for delivery  Address: [address], [place]  Question Answer Comment  Length of Need Lifetime   Patient has (list medical condition): Toxic metabolic encephalopathy   The above medical condition requires: Patient requires the ability to reposition frequently   Head must be elevated  greater than: 45 degrees   Bed type Semi-electric   Support Surface: Gel Overlay      05/22/20 1047           Discharge Care Instructions  (From admission, onward)         Start     Ordered   05/22/20 0000  Discharge wound care:       Comments: As wound care RN   05/22/20 [facility] Follow up.  Why: [place] information: [address] [address] [phone]        Hendricks Limes, MD. Schedule an appointment as soon as possible for a visit in 1 week(s).   Specialty: Internal Medicine Contact information: [address] [place] [address] [phone]              Allergies  Allergen Reactions  . Doxycycline     Made her stopped eating   . 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," as the patient is a Jehovah's Witness    Consultations:  ID   Procedures/Studies: MR Lumbar Spine W Wo Contrast  Result Date: 05/15/2020 CLINICAL DATA:  Epidural abscess. EXAM: MRI LUMBAR SPINE WITHOUT AND WITH CONTRAST TECHNIQUE: Multiplanar and multiecho pulse sequences of the lumbar spine were obtained without and with intravenous contrast. CONTRAST:  20m GADAVIST GADOBUTROL 1 MMOL/ML IV SOLN COMPARISON:  None. FINDINGS: Segmentation:  Standard. Alignment:  Physiologic. Vertebrae:  No fracture, evidence of discitis, or bone lesion. Conus medullaris and cauda equina: Conus extends to the L1 level. Conus and cauda equina appear normal. Paraspinal and other soft tissues: Diffuse edema of the posterior subcutaneous with soft tissue thickening in the sacrococcygeal region. This is only partially seen on sagittal views. Disc levels: T12-L1: No spinal canal or neural foraminal stenosis. L1-2: No spinal canal or neural foraminal stenosis. L2-3: No spinal canal or neural foraminal stenosis. L3-4: Shallow disc bulge and mild facet degenerative changes without significant  spinal canal or neural foraminal stenosis. L4-5: Loss of disc height, disc bulge, mild facet degenerative change ligamentum flavum redundancy resulting in mild spinal canal stenosis with narrowing of the bilateral subarticular zones and moderate bilateral neural foraminal narrowing. L4-5: Loss of disc height, narrowing of the right subarticular zone and mild right neural foraminal narrowing. IMPRESSION: 1. No evidence of and epidural abscess. 2. Soft tissue thickening in the sacrococcygeal region, which is only partially seen on sagittal views. 3. Mild degenerative changes at L4-5 and L4-5 with mild spinal canal stenosis with narrowing of the bilateral subarticular zones and moderate bilateral neural foraminal narrowing at L4-5. Electronically Signed   By: KPedro EarlsM.D.   On: 05/15/2020 16:28   MR FOOT RIGHT W WO CONTRAST  Addendum Date: 05/11/2020   ADDENDUM REPORT: 05/11/2020 19:41 ADDENDUM: The original report was by Dr. WVan Clines The following addendum is by Dr. WVan Clines Please note that there is also some edema in the abductor hallucis and flexor digitorum brevis muscles which is probably neurogenic and less likely to be due to myositis. Electronically Signed   By: WVan ClinesM.D.   On: 05/11/2020 19:41   Result Date: 05/11/2020 CLINICAL DATA:  Nonhealing ulcer along the plantar heel. EXAM: MRI OF THE RIGHT HINDFOOT WITHOUT AND WITH CONTRAST TECHNIQUE: Multiplanar, multisequence MR imaging of the ankle was performed before and after the administration of intravenous contrast. CONTRAST:  6 cc Gadavist COMPARISON:  CT of the right foot from 04/03/2020 FINDINGS: TENDONS Peroneal: Mild peroneus longus and brevis tenosynovitis. Posteromedial: Mild tibialis posterior tenosynovitis distally. Anterior: Unremarkable Achilles: Mild distal Achilles tendinopathy. Plantar Fascia: Thickened medial band of the plantar fascia with surrounding edema compatible with plantar  fasciitis. LIGAMENTS Lateral: Unremarkable Medial: Unremarkable CARTILAGE Ankle Joint: Unremarkable Subtalar Joints/Sinus Tarsi: Trace effusion of the posterior subtalar facet. Bones: Beneath the severe ulceration along the posterior heel, there are bands of enhancement within the marrow for example on image 13  of series 15. One atypical feature is the low STIR signal within these bands, which could indicate fibrosis rather than necessarily infection. These bands were not readily appreciable on the CT from 04/03/2020. No overt bony destruction is identified. Please note that today's examination was centered on the hindfoot and does not include the distal metatarsals and toes. Other: Prominent ulceration along the posterior calcaneus. Subcutaneous edema and low-grade enhancement medial and lateral to the ankle potentially reflecting cellulitis. IMPRESSION: IMPRESSION 1. Prominent ulceration along the posterior heel with bands of enhancement within the marrow, but no overt bony destruction. Although suspicious for early osteomyelitis, there are some atypical features such as reduced rather than accentuated STIR signal. 2. Plantar fasciitis. 3. Mild tenosynovitis of the peroneal tendons and tibialis posterior. 4. Mild distal Achilles tendinopathy. 5. Trace effusion of the posterior subtalar facet. 6. Subcutaneous edema and low-grade enhancement medial and lateral to the ankle potentially reflecting cellulitis. 7. Please note that today's examination was centered on the hindfoot and does not include the distal metatarsals and toes. Electronically Signed: By: Van Clines M.D. On: 05/11/2020 18:35   IR Removal Tun Cv Cath W/O FL  Result Date: 05/16/2020 INDICATION: Patient with history of osteomyelitis in need of durable IV access for long-term IV antibiotic use s/p tunneled right IJ CVC placement in IR 04/11/2020. Patient no longer requiring IV antibiotics. Request is made for removal of tunneled CVC. EXAM:  REMOVAL OF TUNNELED CENTRAL VENOUS CATHETER MEDICATIONS: 6 mL 1% lidocaine COMPLICATIONS: None immediate. PROCEDURE: Informed written consent was obtained from the patient's daughter following an explanation of the procedure, risks, benefits and alternatives to treatment. A time out was performed prior to the initiation of the procedure. Maximal barrier sterile technique was utilized including mask, sterile gowns, sterile gloves, hand hygiene, and Hibiclens. 1% lidocaine was injected under sterile conditions along the subcutaneous tunnel. Utilizing a combination of blunt dissection and gentle traction, the catheter was removed intact. Hemostasis was obtained with manual compression. A dressing was placed. The patient tolerated the procedure well without immediate post procedural complication. IMPRESSION: Successful removal of tunneled central venous catheter. Read by: Earley Abide, PA-C Electronically Signed   By: Aletta Edouard M.D.   On: 05/16/2020 15:58   DG CHEST PORT 1 VIEW  Result Date: 05/14/2020 CLINICAL DATA:  Shortness of breath, altered mental status, history hypertension, CHF, stroke, coronary artery disease, diabetes mellitus EXAM: PORTABLE CHEST 1 VIEW COMPARISON:  Portable exam 1840 hours compared to 05/13/2020 FINDINGS: RIGHT jugular line with tip projecting over cavoatrial junction. Normal heart size post TAVR and CABG. Atherosclerotic calcification aorta. Mediastinal contours and pulmonary vascularity normal. Lungs clear. No pulmonary infiltrate, pleural effusion or pneumothorax. Bones demineralized. IMPRESSION: No acute abnormalities. Electronically Signed   By: Lavonia Dana M.D.   On: 05/14/2020 20:42   DG CHEST PORT 1 VIEW  Result Date: 05/13/2020 CLINICAL DATA:  Fever EXAM: PORTABLE CHEST 1 VIEW COMPARISON:  05/09/2020 FINDINGS: Prior TAVR and CABG. Heart is normal size. Tortuous ectatic thoracic aorta. No confluent airspace opacities or effusions. Right internal jugular central line  is unchanged. Feeding tube is seen entering the stomach and projects off the lower aspect of the film. IMPRESSION: Tortuous, ectatic aorta. No acute cardiopulmonary disease. Electronically Signed   By: Rolm Baptise M.D.   On: 05/13/2020 15:35   DG Chest Port 1 View  Result Date: 05/09/2020 CLINICAL DATA:  Lethargy and altered mental status. Questionable sepsis - evaluate for abnormality EXAM: PORTABLE CHEST 1 VIEW COMPARISON:  Radiograph  02/08/2020 FINDINGS: Right internal jugular central venous catheter tip at the atrial caval junction. Post median sternotomy and TAVR. Implanted loop recorder projects over the left chest wall. Lung volumes are low. Mild streaky atelectasis at the left greater than right lung base. No confluent consolidation. Stable heart size. Unchanged mediastinal contours with aortic atherosclerosis and tortuosity. Chronic cardiomegaly. No pulmonary edema. No large pleural effusion or pneumothorax. IMPRESSION: Low lung volumes with streaky atelectasis at the left greater than right lung base. Electronically Signed   By: Keith Rake M.D.   On: 05/09/2020 19:59   DG Abd Portable 1V  Result Date: 05/11/2020 CLINICAL DATA:  Status post feeding tube placement. EXAM: PORTABLE ABDOMEN - 1 VIEW COMPARISON:  None. FINDINGS: Feeding tube is in place with the tip at the ligament of Treitz. IMPRESSION: Feeding tube in good position. Electronically Signed   By: Inge Rise M.D.   On: 05/11/2020 13:22   VAS [place] UPPER EXTREMITY VENOUS DUPLEX  Result Date: 05/13/2020 UPPER VENOUS STUDY  Indications: Swelling Risk Factors: None identified. Anticoagulation: Lovenox. Limitations: Patient cooperation and positioning. Comparison Study: No previous Upper venous duplex Performing Technologist: Vonzell Schlatter RVT  Examination Guidelines: A complete evaluation includes B-mode imaging, spectral Doppler, color Doppler, and power Doppler as needed of all accessible portions of each vessel. Bilateral  testing is considered an integral part of a complete examination. Limited examinations for reoccurring indications may be performed as noted.  Left Findings: +----------+------------+---------+-----------+----------+--------------+ LEFT      CompressiblePhasicitySpontaneousProperties   Summary     +----------+------------+---------+-----------+----------+--------------+ IJV           Full       Yes       Yes                             +----------+------------+---------+-----------+----------+--------------+ Subclavian               Yes       Yes                             +----------+------------+---------+-----------+----------+--------------+ Axillary                 Yes       Yes                             +----------+------------+---------+-----------+----------+--------------+ Brachial      Full       Yes       Yes                             +----------+------------+---------+-----------+----------+--------------+ Radial        Full                                                 +----------+------------+---------+-----------+----------+--------------+ Ulnar                                               Not visualized +----------+------------+---------+-----------+----------+--------------+ Cephalic      Full                                                 +----------+------------+---------+-----------+----------+--------------+  Basilic                                             Not visualized +----------+------------+---------+-----------+----------+--------------+  Summary:  Right: However, unable to visualize the Unable to see Ulnar or Basilic due to patient cooperation for positioning. Unable to follow directions.  Left: No evidence of deep vein thrombosis in the upper extremity. No evidence of superficial vein thrombosis in the upper extremity.  *See table(s) above for measurements and observations.  Diagnosing physician: Ruta Hinds MD  Electronically signed by Ruta Hinds MD on 05/13/2020 at 11:17:29 AM.    Final       Subjective: Patient seen and examined the bedside this morning.  Overall she is hemodynamically stable, looks comfortable, alert and awake but confused.  Family adamant about taking her home  Discharge Exam: Vitals:   05/21/20 0539 05/22/20 0524  BP: 131/70 (!) 148/92  Pulse: 81 86  Resp: 18 17  Temp: 98.5 F (36.9 C) 98.1 F (36.7 C)  SpO2: 99% 100%   Vitals:   05/18/20 2138 05/20/20 0610 05/21/20 0539 05/22/20 0524  BP: 123/81 134/65 131/70 (!) 148/92  Pulse: 82 61 81 86  Resp: '16 15 18 17  ' Temp: 97.8 F (36.6 C)  98.5 F (36.9 C) 98.1 F (36.7 C)  TempSrc: Oral  Oral Oral  SpO2: 100% (!) 74% 99% 100%  Weight:      Height:        General: Pt is alert, awake, chronically looking, pale Cardiovascular: RRR, S1/S2 +, no rubs, no gallops Respiratory: CTA bilaterally, no wheezing, no rhonchi Abdominal: Soft, NT, ND, bowel sounds + Extremities: no edema, no cyanosis    The results of significant diagnostics from this hospitalization (including imaging, microbiology, ancillary and laboratory) are listed below for reference.     Microbiology: Recent Results (from the past 240 hour(s))  Culture, blood (routine x 2)     Status: None   Collection Time: 05/13/20  1:12 PM   Specimen: BLOOD  Result Value Ref Range Status   Specimen Description BLOOD RIGHT ANTECUBITAL  Final   Special Requests   Final    BOTTLES DRAWN AEROBIC ONLY Blood Culture results may not be optimal due to an inadequate volume of blood received in culture bottles   Culture   Final    NO GROWTH 5 DAYS Performed at [facility], [place] [address], [place], [place] [address]    Report Status 05/18/2020 FINAL  Final  Culture, blood (routine x 2)     Status: None   Collection Time: 05/13/20  1:29 PM   Specimen: BLOOD RIGHT HAND  Result Value Ref Range Status   Specimen Description BLOOD RIGHT HAND  Final    Special Requests   Final    BOTTLES DRAWN AEROBIC ONLY Blood Culture results may not be optimal due to an inadequate volume of blood received in culture bottles   Culture   Final    NO GROWTH 5 DAYS Performed at [facility], [place] [address], [place], [place] [address]    Report Status 05/18/2020 FINAL  Final     Labs: BNP (last 3 results) Recent Labs    06/24/19 1605 07/17/19 2150  BNP 430.7* 734.2*   Basic Metabolic Panel: Recent Labs  Lab 05/17/20 0100  NA 143  K 5.4*  CL 117*  CO2  21*  GLUCOSE 173*  BUN 43*  CREATININE 1.19*  CALCIUM 7.3*   Liver Function Tests: Recent Labs  Lab 05/17/20 0100  AST 28  ALT 18  ALKPHOS 179*  BILITOT 0.5  PROT 4.6*  ALBUMIN 1.1*   No results for input(s): LIPASE, AMYLASE in the last 168 hours. No results for input(s): AMMONIA in the last 168 hours. CBC: Recent Labs  Lab 05/16/20 0500 05/17/20 0100  WBC 12.0* 11.4*  NEUTROABS 8.9* 8.3*  HGB 5.6* 5.6*  HCT 17.3* 17.6*  MCV 95.6 98.9  PLT 171 176   Cardiac Enzymes: No results for input(s): CKTOTAL, CKMB, CKMBINDEX, TROPONINI in the last 168 hours. BNP: Invalid input(s): POCBNP CBG: Recent Labs  Lab 05/21/20 0629 05/21/20 1132 05/21/20 1737 05/22/20 0003 05/22/20 0526  GLUCAP 69* 90 70 89 79   D-Dimer No results for input(s): DDIMER in the last 72 hours. Hgb A1c No results for input(s): HGBA1C in the last 72 hours. Lipid Profile No results for input(s): CHOL, HDL, LDLCALC, TRIG, CHOLHDL, LDLDIRECT in the last 72 hours. Thyroid function studies No results for input(s): TSH, T4TOTAL, T3FREE, THYROIDAB in the last 72 hours.  Invalid input(s): FREET3 Anemia work up No results for input(s): VITAMINB12, FOLATE, FERRITIN, TIBC, IRON, RETICCTPCT in the last 72 hours. Urinalysis    Component Value Date/Time   COLORURINE YELLOW 05/09/2020 1906   APPEARANCEUR CLOUDY (A) 05/09/2020 1906   APPEARANCEUR Clear 09/01/2018 1131   LABSPEC 1.011 05/09/2020 1906    PHURINE 5.0 05/09/2020 1906   GLUCOSEU NEGATIVE 05/09/2020 1906   GLUCOSEU NEG mg/dL 12/25/2006 2041   HGBUR MODERATE (A) 05/09/2020 1906   HGBUR trace-lysed 03/05/2010 0856   BILIRUBINUR NEGATIVE 05/09/2020 1906   BILIRUBINUR Negative 09/01/2018 1131   KETONESUR NEGATIVE 05/09/2020 1906   PROTEINUR 30 (A) 05/09/2020 1906   UROBILINOGEN 0.2 11/11/2011 1701   UROBILINOGEN 0.2 11/11/2011 1645   NITRITE NEGATIVE 05/09/2020 1906   LEUKOCYTESUR LARGE (A) 05/09/2020 1906   Sepsis Labs Invalid input(s): PROCALCITONIN,  WBC,  LACTICIDVEN Microbiology Recent Results (from the past 240 hour(s))  Culture, blood (routine x 2)     Status: None   Collection Time: 05/13/20  1:12 PM   Specimen: BLOOD  Result Value Ref Range Status   Specimen Description BLOOD RIGHT ANTECUBITAL  Final   Special Requests   Final    BOTTLES DRAWN AEROBIC ONLY Blood Culture results may not be optimal due to an inadequate volume of blood received in culture bottles   Culture   Final    NO GROWTH 5 DAYS Performed at [facility], [place] [address] [place], [place] [address]    Report Status 05/18/2020 FINAL  Final  Culture, blood (routine x 2)     Status: None   Collection Time: 05/13/20  1:29 PM   Specimen: BLOOD RIGHT HAND  Result Value Ref Range Status   Specimen Description BLOOD RIGHT HAND  Final   Special Requests   Final    BOTTLES DRAWN AEROBIC ONLY Blood Culture results may not be optimal due to an inadequate volume of blood received in culture bottles   Culture   Final    NO GROWTH 5 DAYS Performed at [facility], [place] [address] [place], [place] [address]    Report Status 05/18/2020 FINAL  Final    Please note: You were cared for by a hospitalist during your hospital stay. Once you are discharged, your primary care physician will handle any further medical issues. Please  note that NO REFILLS for any discharge medications will be authorized once you are discharged, as it is imperative that  you return to your primary care physician (or establish a relationship with a primary care physician if you do not have one) for your post hospital discharge needs so that they can reassess your need for medications and monitor your lab values.    Time coordinating discharge: 40 minutes  SIGNED:   Shelly Coss, MD  [facility] 05/22/2020, 11:35 AM Pager [phone]  If 7PM-7AM, please contact night-coverage [email or web address] Password [email or web address]

## 2020-05-22 NOTE — Progress Notes (Signed)
[facility] here to transport her home. Denies pain. Alert to person/place. No s/s of distress. Contacting daughter Mrs. Tasia Catchings [phone] to notify of transport. VS taken.

## 2020-05-22 NOTE — TOC Progression Note (Addendum)
Transition of Care [facility]) - Progression Note    Patient Details  Name: [name] MRN: [id number] Date of Birth: 02-03-1946  Transition of Care [facility]) CM/SW Contact  Jacalyn Lefevre Edson Snowball, RN Phone Number: 05/22/2020, 10:55 AM  Clinical Narrative:     After family meeting with Olivia Mackie with [place], they have decided to take patient home with home health services.   [name] left hospital to go to work.   NCM spoke to [place]. Patient will be going to Rechel's address: [address] [place], [place] [address]   [place] phone number [place] understands that home health will not be in the home daily or for long peroids of time. Rechel will need to learn wound care today prior to discharge.   Patient will need hospital bed with air mattress, over bed table and 3 in1 at home. DME ordered through [place] with Andee Poles for delivery today. They are aware patient discharging today via [place].  Discussed home health agencies. Rechel has no preference. Hoyle Sauer with [facility] accepted referral for HOME HEALTH.  NCM will leave [facility] paperwork in chart. Once DME delivered will call.     PCP is Dr Lynnae January, NCM called Dr [facility], [facility] Internal Medicine scheduled an appointment with Dr Konrad Penta on May 29, 2020 at [place] am [place] aware.Hoyle Sauer with [facility] also aware.  Spoke to [place] with [facility], they have spoken to Rechel , she declined the table that goes over the bed but agreed to hospital bed and 3 in1. A driver has it on his truck and will call Rechel 30 minutes prior to arriving at her address. [facility] aware. Once DME in place at [place] can be called.    [place] asked her to call 6n nursing station at [phone] when she receives the DME, and ask her her mother's nurse. At that time nurse can call ambulance. [place] voiced understanding. Bedside nurse aware.  PTAR paperwork including phone number in  patient's chart. Expected Discharge Plan: [place] Barriers to Discharge: Continued Medical Work up  Expected Discharge Plan and Services Expected Discharge Plan: [place] Choice: Hospice Living arrangements for the past 2 months: [place] Expected Discharge Date: 05/18/20                                     Social Determinants of Health (SDOH) Interventions    Readmission Risk Interventions No flowsheet data found.

## 2020-05-22 NOTE — Care Management (Signed)
    Durable Medical Equipment  (From admission, onward)         Start     Ordered   05/22/20 1248  For home use only DME Hospital bed  Once       Comments: Call daughter Rechel [phone] for delivery  Address: [address], [place]  Question Answer Comment  Length of Need Lifetime   Patient has (list medical condition): Toxic metabolic encephalopathy   The above medical condition requires: Patient requires the ability to reposition frequently   Head must be elevated greater than: 45 degrees   Bed type Semi-electric   Support Surface: Low Air loss Mattress      05/22/20 1248   05/22/20 1047  For home use only DME Other see comment  Once       Comments: Over the hospital bed table  Question:  Length of Need  Answer:  Lifetime   05/22/20 1047   05/22/20 1047  For home use only DME 3 n 1  Once        05/22/20 1047

## 2020-05-23 ENCOUNTER — Other Ambulatory Visit: Payer: Self-pay

## 2020-05-23 ENCOUNTER — Telehealth: Payer: Self-pay | Admitting: *Deleted

## 2020-05-23 DIAGNOSIS — E43 Unspecified severe protein-calorie malnutrition: Secondary | ICD-10-CM

## 2020-05-23 DIAGNOSIS — E1169 Type 2 diabetes mellitus with other specified complication: Secondary | ICD-10-CM

## 2020-05-23 DIAGNOSIS — Z8673 Personal history of transient ischemic attack (TIA), and cerebral infarction without residual deficits: Secondary | ICD-10-CM

## 2020-05-23 NOTE — Chronic Care Management (AMB) (Signed)
  Care Management   Note  05/23/2020 Name: [name] MRN: [id number] DOB: 15-Dec-1945  [name] is a 75 y.o. year old female who is a primary care patient of Jeralyn Bennett, MD. I reached out to Thurnell Lose by phone today in response to a referral sent by [name]'s PCP, Dr Konrad Penta.    [name] was given information about care management services today including:  1. Care management services include personalized support from designated clinical staff supervised by her physician, including individualized plan of care and coordination with other care providers 2. 24/7 contact phone numbers for assistance for urgent and routine care needs. 3. The patient may stop care management services at any time by phone call to the office staff.  Patient agreed to services and verbal consent obtained.   Follow up plan: Telephone appointment with care management team member scheduled for:06/12/2020  Kellene Mccleary  Care Guide, Embedded Care Coordination Reader  Care Management

## 2020-05-25 ENCOUNTER — Telehealth: Payer: Self-pay

## 2020-05-25 NOTE — Telephone Encounter (Signed)
Palliative Medicine RN Note: Our office rec'd a call from pt's family, who had questions about wound care. They have run out of the medication for the wound and asked what to use. I explained that they need to follow the discharge instructions, and call the pt's pcp if there are further questions. They do not have a pcp appt scheduled until Monday 4/4, so they have taken the empty "ointment" container to her pharmacist, who made recommendations. As I cannot see what the orders are, I let her know that she needs to ask the pt's pcp Monday, but I would follow the pharmacist's advice until then.  Marjie Skiff Mashelle Busick, RN, BSN, [facility] Palliative Medicine Team 05/25/2020 4:21 PM Office [phone]

## 2020-05-29 ENCOUNTER — Other Ambulatory Visit: Payer: Self-pay | Admitting: Student

## 2020-05-29 ENCOUNTER — Other Ambulatory Visit: Payer: Self-pay

## 2020-05-29 ENCOUNTER — Ambulatory Visit (INDEPENDENT_AMBULATORY_CARE_PROVIDER_SITE_OTHER): Payer: Medicare Other | Admitting: Student

## 2020-05-29 ENCOUNTER — Telehealth: Payer: Self-pay

## 2020-05-29 DIAGNOSIS — R4189 Other symptoms and signs involving cognitive functions and awareness: Secondary | ICD-10-CM

## 2020-05-29 DIAGNOSIS — Z7189 Other specified counseling: Secondary | ICD-10-CM

## 2020-05-29 DIAGNOSIS — R29818 Other symptoms and signs involving the nervous system: Secondary | ICD-10-CM | POA: Diagnosis not present

## 2020-05-29 DIAGNOSIS — S91301A Unspecified open wound, right foot, initial encounter: Secondary | ICD-10-CM

## 2020-05-29 DIAGNOSIS — K5900 Constipation, unspecified: Secondary | ICD-10-CM | POA: Diagnosis not present

## 2020-05-29 MED ORDER — LACTULOSE 10 GM/15ML PO SOLN: 10.0000 g | Freq: Every day | ORAL | 0 refills | Status: DC | PRN

## 2020-05-29 NOTE — Progress Notes (Signed)
  [facility] Internal Medicine Residency Telephone Encounter Continuity Care Appointment  HPI:   This telephone encounter was created for [name] on 05/29/2020 for the following purpose/cc hospital follow up visit with significant lethargy.   Past Medical History:  Past Medical History:  Diagnosis Date  . Acute on chronic diastolic CHF (congestive heart failure) ([place]) 06/22/2017  . Acute on chronic renal failure ([place]) 05/25/2011  . Adenocarcinoma of breast ([place]) 1996   Completed tamoxifen and had mastectomy.  . Aortic stenosis, severe    s/p aortic valve replacement with porcine valve 06/2004.  ECHO 2010 EF 00%, LVH, diastolic dysfxn, Bioprostetic aoritc valve, mild AS. ECHO 2013 EF 60%, Nl aortic artificial valve, dynamic obstruction in the outflow tract   Class IIb rec for annual TTE after 5 yrs. She had a TTE 2013    . Arthritis   . CAD (coronary artery disease) 2006   s/p CABG (5/06) w/ saphenous vein to RCA at time of AVR  . CKD (chronic kidney disease) stage 2, GFR 60-89 ml/min 06/03/2011   She is no longer taking NSAIDs.   [place] CVA (cerebral infarction) 2006   Post-op from AVR. Presumed embolic in nature. Carotid stenosis of R 60-79%. Repeat dopplers 4/10 no R stenosis and L stenosis of 1-29%.  . Depression    Controlled on Paxil  . Diabetes mellitus 1992   Dx 04/25/1990. Now insulin dependent, started 2008. On ACEI.   . Diverticulosis 2001  . History of 2019 novel coronavirus disease (COVID-19)   . Hyperlipidemia    Mgmt with a statin  . Hypertension    Requires 4 drug tx  . Osteoporosis 2006   DEXA 10/06 : L femur T -2.8, R -2.7. Lumbar T -2.4. On bisphosphonates and  Calcium / Vit D.  . Peripheral vascular disease ([place])   . Presence of permanent cardiac pacemaker   . Refusal of blood transfusions as patient is Jehovah's Witness   . Stroke ([place])       ROS:   Positive for: excessive fatigue, immobility, confusion, decreased PO intake, constipation, heel and  sacral wounds  Negative for: pain, vomiting, fevers, chills   History and ROS were obtained by patient's daughter. Both are limited (level 5 caveat) due to altered mental status and severe lethargy.  Assessment / Plan / Recommendations:   Please see A&P under problem oriented charting for assessment of the patient's acute and chronic medical conditions.   As always, pt is advised that if symptoms worsen or new symptoms arise, they should go to an urgent care facility or to to ER for further evaluation.   Consent and Medical Decision Making:   Patient discussed with Dr. Rebeca Alert and Dr. Jimmye Norman.   This is a telephone encounter between [name] and Jeralyn Bennett on 05/29/2020 for hospital follow up. The visit was conducted with the patient located at home and Jeralyn Bennett at [facility]. The patient's identity was confirmed using their DOB and current address. The his/her legal guardian has consented to being evaluated through a telephone encounter and understands the associated risks (an examination cannot be done and the patient may need to come in for an appointment) / benefits (allows the patient to remain at home, decreasing exposure to coronavirus). I personally spent 30 minutes on medical discussion.    Jeralyn Bennett, MD 05/29/2020, 11:20 AM Pager: [phone]

## 2020-05-29 NOTE — Telephone Encounter (Signed)
Return call to [place], [facility] - stated pt was at a nursing home, now back at home and wanting to re-establish care here at [facility]. Telehealth appt this morning. So [place] needs a doctor to states they will follow up on any Nursing,PT/OT, HH orders since no longer in a facility. I will send request to Dr Konrad Penta for her ok. Thanks

## 2020-05-29 NOTE — Telephone Encounter (Signed)
[name] with [facility] requesting VO to start care. Please call back.

## 2020-05-30 ENCOUNTER — Telehealth: Payer: Self-pay

## 2020-05-30 NOTE — Assessment & Plan Note (Signed)
Daughter states it has been ~ 1 week since patient last had a bowel movement. This may be in the setting of decreased PO intake and immobility, although patient does have a history of constipation in the past.   - Will prescribe lactulose daily PRN with instruction to up-titrate as necessary for 1 soft bowel movement daily.

## 2020-05-30 NOTE — Assessment & Plan Note (Signed)
Patient's daughter is providing her wound care currently. She states her heel wound has significantly improved with new skin growing over her wound. She continues to have a sacral wound although this has also somewhat improved (per daughter) since her hospital stay.   - Palliative care will assist in evaluating patient's home health needs

## 2020-05-30 NOTE — Telephone Encounter (Signed)
Spoke with patient's daughter Apolonio Schneiders and scheduled an in-person Palliative Consult for 06/07/20 @ 9 AM.  COVID screening was negative. No pets in home. Patient lives with daughter.  Consent obtained; updated Outlook/Netsmart/Team List and Epic.  Family is aware they may be receiving a call from NP the day before or day of to confirm appointment.

## 2020-05-30 NOTE — Telephone Encounter (Addendum)
Dr Lenis Noon response relayed to [place], [facility]. She stated [facility] has palliative care and Hospice services. She asked which agency was the referral sent to ; informed referral was ordered yesterday afternoon;unsure if it has been address yet. But I would relay message to our referral coordinator, [place].  Talked to [place] - stated it was sent to [facility].  I called Tanya back to let her know of the above.

## 2020-05-30 NOTE — Assessment & Plan Note (Signed)
Patient has baseline neurocognitive deficit as listed above; however, per daughter is usually alert, able to hold conversations, and ambulate with intermittent assistance from a cane. Her daughter notes she has had a steep decline over the past several months with multiple hospital stays for critical limb ischemia, skin and urinary tract infections. She was just discharged from [facility] 05/22/20 and initially had been made comfort care; however, family decided just prior to discharge to reverse the decision to pursue hospice and instead have [name] return home with home health. She has not yet had these services come to the home as they require prior-authorization from myself.   It is quite clear after reviewing documentation and after speaking with patient's daughter that [name]'s health has declined significantly over the past several months. She currently spends a majority of the day speaking and is not able to take part in conversations with her daughter. She is eating only small amounts of soft foods at a time. She had been reliant on a feeding tube during her hospital stay and continues to have the same urinary foley in place. She continues to make good urine although has not had a bowel movement in ~ 1 week. She did have poor PO intake prior to her recent admission with a glucose of 29.   Her current lethargy and generalized weakness are likely in the setting of deconditioning, possibly exacerbated by poor PO intake +/- hypoglycemia, and could be exacerbated by infection given multiple possible sources. I clarified the role of hospice to her daughter and informed her that I will send a referral for palliative care to come to the home for further evaluation. I am unable to refer for home health services currently without face-to-face encounter; however, informed daughter that palliative care will be able to better assess patient's current needs.  Informed daughter to call [facility] at  [phone] if she has further questions or concerns and she voiced understanding.

## 2020-05-31 NOTE — Progress Notes (Signed)
Internal Medicine Clinic Attending  Case discussed with Dr. Konrad Penta  At the time of the visit.  We reviewed the resident's history and exam and pertinent patient test results.  I agree with the assessment, diagnosis, and plan of care documented in the resident's note. Minor corrections - this telehealth visit occurred between patient's daughter and Dr. Konrad Penta (patient herself is unable to have conversation due to illness).  Also, patient is spending most of her time sleeping (this is a correction from a typo).  Palliative services would be ideal way to help assess appropriate care needs and to introduce Hospice again.

## 2020-06-07 ENCOUNTER — Other Ambulatory Visit: Payer: Medicare Other | Admitting: Hospice

## 2020-06-07 ENCOUNTER — Other Ambulatory Visit: Payer: Self-pay

## 2020-06-07 ENCOUNTER — Other Ambulatory Visit: Payer: Self-pay | Admitting: Student

## 2020-06-07 DIAGNOSIS — R5381 Other malaise: Secondary | ICD-10-CM | POA: Diagnosis not present

## 2020-06-07 DIAGNOSIS — Z7401 Bed confinement status: Secondary | ICD-10-CM

## 2020-06-07 DIAGNOSIS — Z515 Encounter for palliative care: Secondary | ICD-10-CM | POA: Diagnosis not present

## 2020-06-07 DIAGNOSIS — R5383 Other fatigue: Secondary | ICD-10-CM | POA: Diagnosis not present

## 2020-06-07 DIAGNOSIS — R627 Adult failure to thrive: Secondary | ICD-10-CM

## 2020-06-07 DIAGNOSIS — L89159 Pressure ulcer of sacral region, unspecified stage: Secondary | ICD-10-CM

## 2020-06-07 NOTE — Addendum Note (Signed)
Addended by: Jeralyn Bennett on: 06/07/2020 01:52 PM   Modules accepted: Orders

## 2020-06-07 NOTE — Progress Notes (Addendum)
[facility] Palliative Care Consult Note Telephone: [phone]  Fax: [phone]  PATIENT NAME: [name] [id number] [phone] (home)  DOB: September 04, 1945 MRN: [id number]  PRIMARY CARE PROVIDER:    Jeralyn Bennett, MD,  [place] [address] [phone]  REFERRING PROVIDER:   Jeralyn Bennett, MD [address] [place],  [place] [address] [phone]  RESPONSIBLE PARTY:   Hyman Hopes [id number]    Name Relation Home Work [place], [place] Daughter   [phone]   Armstrong,[name] Daughter [phone]     Elberta Spaniel Daughter [phone]  [phone]      I met face to face with patient and Apolonio Schneiders at home. Palliative Care was asked to follow this patient by consultation request of  Jeralyn Bennett, MD to address advance care planning and complex medical decision making.     ASSESSMENT AND / RECOMMENDATIONS:   Advance Care Planning/Goals of Care: Goals include to maximize quality of life and symptom management. Our advance care planning conversation included a discussion about:     The value and importance of advance care planning   Difference between Hospice and Palliative care  Experiences with loved ones who have been seriously ill or have died   Exploration of goals of care in the event of a sudden injury or illness   Identification and preparation of a healthcare agent   Review and updating or creation of an  advance directive document .   de-escalate disease focused treatments due to poor prognosis.  CODE STATUS: Extensive discussion on code status. Apolonio Schneiders elected Do Not Resuscitate. NP signed DNR form for patient to keep at home; same document documented to Epic today. Ongoing failure to thrive - deconditioning and overall decline in functional status. Family had negative experience in the past with in-hospital hospice  and does not wish for hospice care, at this time. Apolonio Schneiders reports No more medications for patient   at this time. Comfort care. Therapeutic listening provided and ample emotional support provided. Palliative care will continue to provide support to patient, family and the medical team. Home death is desired, per Apolonio Schneiders. MOST form given to [facility] for next meeting.   I spent 46  minutes providing this consultation. More than 50% of the time in this consultation was spent on counseling patient and coordinating communication. --------------------------------------------------------------------------------------------------------------------------------------  Symptom Management/Plan: Failure to thrive (adult): Continue ongoing supportive care.  Home health service for assistance with activities of daily living. Stage 4 Sacral wound: Home health service for wound care.  Repositioning discussed. Right heel wound: Home health services for wound care.  Repositioning and floating BLE discussed and demonstrated.  NP reached out to PCP who promptly responded that she will order home health service as requested Fatigue: Energy conservation, cluster activities, provide rest periods; offer small frequent meals and drinks.  Follow up Palliative Care Visit: Palliative care will continue to follow for complex medical decision making, advance care planning, and clarification of goals. Return 6 weeks or prn.Encouraged to call provider sooner with any concerns.    PPS: 20%  HOSPICE ELIGIBILITY/DIAGNOSIS: TBD  Chief Complaint: Palliative care initial visit/Failure to thrive  HISTORY OF PRESENT ILLNESS:  [name] is a 75 y.o. year old female  with chronic failure to thrive, ongoing since November 2021 worsened in the past month and further worsened in the past week - patient not eating food in the past 5 days;  patient down to sips and little bites. This results in worsening weakness, abnormal weight loss and  overall deconditioning and decline in functional status - bedbound PPS 20% with ongoing skin breakdown - She has right heal ulcer and stage stage 4 sacral wound. She was recently in nursing home following recent hospitalization 04/03/2020-04/11/2020 for  right lower extremity ischemia status post angiography recanalization ANCA tibial artery balloon angioplasty, and  infection of stage IV sacral wound.  Chart review indicates patient underwent general surgery for stage IV sacral ulcer and bedside debridement.  Patient is now at home with daughter who reports patient only sips on VA juice and sparkling water. Patient is non ambulatory and is totally dependent for ADLs.  Daughter reports patient's medications were discontinued and she is not on any medication at this time. She has plans for PACE to commence seeing patient but this will not commence until next month. Patient with history of adenocarcinoma of breast Status post chemotherapy and radiation. History of CVA, aortic stenosis, CHF, CKD, diabetes mellitus type 2, osteomyelitis of toe, sacral decub decubitus ulcer, hypertension. History obtained from review of EMR, discussion with  family and/or Ms. Peasley.   Review and summarization of Epic records shows history from other than patient. Rest of 10 point ROS asked and negative.  Palliative Care was asked to follow this patient by consultation request of Dr Sheran Luz to help address complex decision making in the context of advance care planning and goals of care clarification.    Review of lab tests/diagnostics   Results for DEDE, DOBESH (MRN [id number]) as of 06/07/2020 10:06  Ref. Range 05/17/2020 01:00  COMPREHENSIVE METABOLIC PANEL Unknown Rpt (A)  Sodium Latest Ref Range: 135 - 145 mmol/L 143  Potassium Latest Ref Range: 3.5 - 5.1 mmol/L 5.4 (H)  Chloride Latest Ref Range: 98 - 111 mmol/L 117 (H)  CO2 Latest Ref Range: 22 - 32 mmol/L 21 (L)  Glucose Latest Ref Range: 70 - 99 mg/dL 173 (H)   BUN Latest Ref Range: 8 - 23 mg/dL 43 (H)  Creatinine Latest Ref Range: 0.44 - 1.00 mg/dL 1.19 (H)  Calcium Latest Ref Range: 8.9 - 10.3 mg/dL 7.3 (L)  Anion gap Latest Ref Range: 5 - 15  5  Alkaline Phosphatase Latest Ref Range: 38 - 126 U/L 179 (H)  Albumin Latest Ref Range: 3.5 - 5.0 g/dL 1.1 (L)  AST Latest Ref Range: 15 - 41 U/L 28  ALT Latest Ref Range: 0 - 44 U/L 18  Total Protein Latest Ref Range: 6.5 - 8.1 g/dL 4.6 (L)  Total Bilirubin Latest Ref Range: 0.3 - 1.2 mg/dL 0.5  GFR, Estimated Latest Ref Range: >60 mL/min 47 (L)   ROS General: NAD EYES: denies vision changes ENMT: denies dysphagia Cardiovascular: denies chest pain Pulmonary: denies cough, SOB Abdomen: endorses poor appetite GU: denies dysuria, urinary frequency MSK:  Endorses weakness,  no [name] reported Skin: wound to sacrum and right heel Neurological: denies pain, denies insomnia Psych: Endorses positive mood Heme/lymph/immuno: denies bruises, abnormal bleeding  Physical Exam Height/Weight 5 feet 6 inches 135Ibs down from 175 Ibs 5 months ago.  Constitutional: NAD General: Well groomed EYES: anicteric sclera, lids intact, no discharge  ENMT: Moist mucous membrane CV: S1 S2, RRR, no LE edema Pulmonary: LCTA, no increased work of breathing, no cough, Abdomen: active BS + 4 quadrants, soft and non tender, no ascites GU: no suprapubic tenderness MSK: moderate sarcopenia, non ambulatory, weakness Skin: warm and dry, pressure wound to  sacrum and Right heel, dressing intact Neuro:  weakness, otherwise non focal Psych: non-anxious affect Hem/lymph/immuno: no widespread bruising  CURRENT PROBLEM LIST:  Patient Active Problem List   Diagnosis Date Noted  . Severe anemia   . Hypoalbuminemia   . Protein-calorie malnutrition, severe 05/11/2020  . Anasarca   . Catheter-associated urinary tract infection ([place]) 05/09/2020  . Toxic metabolic encephalopathy 22/44/9753  . Hypothermia 05/09/2020  . Decubitus  ulcer of right heel, stage 3 ([place]) 05/09/2020  . Open wound of heel 04/03/2020  . Bleeding 01/27/2020  . CKD (chronic kidney disease) stage 3, GFR 30-59 ml/min (HCC) 01/27/2020  . Critical lower limb ischemia ([place]) 01/04/2020  . Neurocognitive deficits 08/04/2019  . Controlled type 2 diabetes mellitus without complication ([place]) 00/51/1021  . CVA (cerebral vascular accident) ([place]) 07/23/2019  . Fever   . History of transcatheter aortic valve replacement (TAVR)   . Sacral decubitus ulcer, stage IV ([place]) 07/18/2019  . Sepsis ([place]) 07/18/2019  . Complete heart block ([name])   . Chronic diastolic congestive heart failure ([place]) 02/07/2019  . AKI (acute kidney injury) ([place]) 07/22/2017  . Hypoglycemia 07/21/2017  . Peripheral arterial disease ([place]) 06/18/2017  . B12 deficiency 05/01/2017  . Bilateral lower extremity edema 11/08/2015  . Aortic atherosclerosis ([place]) 06/07/2014  . Abnormality of gait 05/29/2012  . Constipation 03/16/2012  . HOCM (hypertrophic obstructive cardiomyopathy) ([place]) 06/11/2011  . Goals of care, counseling/discussion 06/10/2010  . Hyperlipidemia   . Refusal of blood transfusions as patient is Jehovah's Witness   . History of adenocarcinoma of breast   . Osteoporosis   . Status post CVA   . Aortic stenosis s/p Tissure AVR 2006   . CAD (coronary artery disease)   . Hypertension   . Depression 01/23/2006  . DM2 (diabetes mellitus, type 2) ([place]) 03/25/1990  . Benign hypertensive heart and kidney disease with diastolic CHF, NYHA class 3 and CKD stage 3 ([place]) 1992  . Bradycardia 1992   PAST MEDICAL HISTORY:  Active Ambulatory Problems    Diagnosis Date Noted  . DM2 (diabetes mellitus, type 2) ([place]) 03/25/1990  . Depression 01/23/2006  . Hyperlipidemia   . Refusal of blood transfusions as patient is Jehovah's Witness   . History of adenocarcinoma of breast   . Osteoporosis   . Status post CVA   . Aortic stenosis s/p Tissure AVR 2006   . CAD (coronary artery  disease)   . Hypertension   . Goals of care, counseling/discussion 06/10/2010  . HOCM (hypertrophic obstructive cardiomyopathy) ([place]) 06/11/2011  . Constipation 03/16/2012  . Abnormality of gait 05/29/2012  . Aortic atherosclerosis ([place]) 06/07/2014  . Bilateral lower extremity edema 11/08/2015  . B12 deficiency 05/01/2017  . Peripheral arterial disease ([place]) 06/18/2017  . Hypoglycemia 07/21/2017  . AKI (acute kidney injury) ([place]) 07/22/2017  . Benign hypertensive heart and kidney disease with diastolic CHF, NYHA class 3 and CKD stage 3 ([place]) 1992  . Bradycardia 1992  . Chronic diastolic congestive heart failure ([place]) 02/07/2019  . Complete heart block ([place])   . Sacral decubitus ulcer, stage IV ([place]) 07/18/2019  . Sepsis ([place]) 07/18/2019  . Fever   . History of transcatheter aortic valve replacement (TAVR)   . Neurocognitive deficits 08/04/2019  . Controlled type 2 diabetes mellitus without complication ([place]) 11/73/5670  . CVA (cerebral vascular accident) ([place]) 07/23/2019  . Critical lower limb ischemia ([place]) 01/04/2020  . Bleeding 01/27/2020  . CKD (chronic kidney disease) stage 3, GFR 30-59 ml/min (HCC) 01/27/2020  . Open  wound of heel 04/03/2020  . Catheter-associated urinary tract infection ([place]) 05/09/2020  . Toxic metabolic encephalopathy 16/11/9602  . Hypothermia 05/09/2020  . Decubitus ulcer of right heel, stage 3 ([place]) 05/09/2020  . Anasarca   . Protein-calorie malnutrition, severe 05/11/2020  . Hypoalbuminemia   . Severe anemia    Resolved Ambulatory Problems    Diagnosis Date Noted  . Acute on chronic renal failure ([place]) 05/25/2011  . Fatigue 06/03/2011  . CKD (chronic kidney disease) stage 2, GFR 60-89 ml/min 06/03/2011  . Acute on chronic diastolic CHF (congestive heart failure) ([place]) 06/22/2017  . Acute exacerbation of CHF (congestive heart failure) ([place]) 06/23/2017  . Hypokalemia 1992  . Dyspnea 07/18/2019  . Septic encephalopathy 07/18/2019  . Bacteremia  due to group B Streptococcus 07/18/2019  . Infection of pacemaker lead wire ([place]) 07/21/2019  . Skin pustule 10/20/2019  . Osteomyelitis of toe ([place]) 02/22/2020   Past Medical History:  Diagnosis Date  . Adenocarcinoma of breast ([place]) 1996  . Arthritis   . CVA (cerebral infarction) 2006  . Diabetes mellitus 1992  . Diverticulosis 2001  . History of 2019 novel coronavirus disease (COVID-19)   . Osteoporosis 2006  . Peripheral vascular disease ([place])   . Presence of permanent cardiac pacemaker   . Stroke [facility])    SOCIAL HX:  Social History   Tobacco Use  . Smoking status: Never Smoker  . Smokeless tobacco: Never Used  Substance Use Topics  . Alcohol use: Yes    Alcohol/week: 0.0 standard drinks    Comment: Occasional beer, monthly   FAMILY HX:  Family History  Problem Relation Age of Onset  . Diabetes Mother   . Hypertension Mother   . Alzheimer's disease Mother   . Heart disease Father 70       AMI at age 77 and 84  . Mental illness Sister   . Heart disease Sister 21       AMI  . Kidney disease Sister       ALLERGIES:  Allergies  Allergen Reactions  . Doxycycline     Made her stopped eating   . 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," as the patient is a Jehovah's Witness     PERTINENT MEDICATIONS:  Outpatient Encounter Medications as of 06/07/2020  Medication Sig  . acetaminophen (TYLENOL) 325 MG tablet Take 650 mg by mouth daily. Not to exceed 3000 mg in 24 hour per stranding order  . Amino Acids-Protein Hydrolys (FEEDING SUPPLEMENT, PRO-STAT SUGAR FREE 64,) LIQD Take 30 mLs by mouth in the morning and at bedtime.  Marland Kitchen aspirin 81 MG EC tablet TAKE 1 TABLET (81 MG TOTAL) BY MOUTH DAILY. SWALLOW WHOLE. (Patient taking differently: Take 81 mg by mouth daily.)  . atorvastatin (LIPITOR) 80 MG tablet Take 1 tablet (80 mg total) by mouth at bedtime.  . clopidogrel (PLAVIX) 75 MG tablet Take 1 tablet (75 mg total) by mouth daily with breakfast. (Patient taking  differently: Take 75 mg by mouth daily.)  . Darbepoetin Alfa (ARANESP) 40 MCG/0.4ML SOSY injection Inject 0.4 mLs (40 mcg total) into the skin once a week.  . diclofenac Sodium (VOLTAREN) 1 % GEL Apply 4 g topically 4 (four) times daily as needed (Pain). Apply to bilateral hips and knees  . DULoxetine (CYMBALTA) 30 MG capsule Take 30 mg by mouth daily.  . Ensure (ENSURE) Take 237 mLs by mouth in the morning and at bedtime. Strawberry if available  . ferumoxytol 510 mg  in sodium chloride 0.9 % 100 mL Inject 510 mg into the vein once a week.  . lactulose (CHRONULAC) 10 GM/15ML solution Take 20 g by mouth daily.  Marland Kitchen lactulose (CHRONULAC) 10 GM/15ML solution Take 15 mLs (10 g total) by mouth daily as needed for mild constipation. Uptitrate as needed for 1 soft bowel movement per day.  . melatonin 3 MG TABS tablet Take 6 mg by mouth every evening.  . mirtazapine (REMERON) 15 MG tablet Take 15 mg by mouth at bedtime.  . ondansetron (ZOFRAN) 4 MG tablet Take 4 mg by mouth every 12 (twelve) hours as needed for nausea or vomiting.   No facility-administered encounter medications on file as of 06/07/2020.    Thank you for the opportunity to participate in the care of Ms. Darnell.  The palliative care team will continue to follow. Please call our office at [phone] if we can be of additional assistance.   Note: Portions of this note were generated with Lobbyist. Dictation errors may occur despite best attempts at proofreading.  Teodoro Spray, NP

## 2020-06-12 ENCOUNTER — Ambulatory Visit: Payer: Medicare Other | Admitting: *Deleted

## 2020-06-12 DIAGNOSIS — Z7401 Bed confinement status: Secondary | ICD-10-CM

## 2020-06-12 DIAGNOSIS — L89159 Pressure ulcer of sacral region, unspecified stage: Secondary | ICD-10-CM

## 2020-06-12 DIAGNOSIS — Z794 Long term (current) use of insulin: Secondary | ICD-10-CM

## 2020-06-12 DIAGNOSIS — N183 Chronic kidney disease, stage 3 unspecified: Secondary | ICD-10-CM

## 2020-06-12 DIAGNOSIS — N1832 Chronic kidney disease, stage 3b: Secondary | ICD-10-CM

## 2020-06-12 DIAGNOSIS — I10 Essential (primary) hypertension: Secondary | ICD-10-CM

## 2020-06-12 NOTE — Chronic Care Management (AMB) (Addendum)
Care Management   Note  06/12/2020 Name: [name] MRN: [id number] DOB: Mar 17, 1945  [name] is enrolled in a Managed Medicaid plan: No. Outreach attempt today was successful.    Spoke at length with patient's daughter and HCPOA [name]. Reviewed role of CCM RN and services provided by CCM. [name] states the patient has 8 adult children that live in the [place] area. They all work except for [name] who is patient's primary caregiver. She says she receives intermittent help from her siblings. She says her sister is a CNA and her niece is in her 2nd year of nursing school and they are voicing concern about "diabetic coma" since patient previously treated her diabetes with insulin and her blood sugar has not been checked since her discharge from the hospital.  Since discharge from the hospital, [name] describes her Mom as requiring total care, is bed bound and is not able to travel to a provider's office, sleeps most of the time but is able to eat and take medications without choking (with assistance), she has periods of alertness, can have meaningful conversations at times and recognizes her family when they visit. She has an indwelling foley catheter and had a recent bowel movement.   [name] states patient has 2 healing skin ulcers one on her sacrum and one on her heel. [name] states she is doing the dressing changes as she says home health services have not started .   Reviewed goals of care; After defining the 3 types of medical care to her: Life Prolonging Care with the focus of care to lengthen life, limited medical care with the goal of restoring health to baseline and comfort care with main goal of comfort, [name] was very clear that she and her Mom, whom she says can provide input on her care goals,  wants limited medical care with the assistance of home health and/or palliative care in [name]'s apartment.  [name]'s is  requesting the clinic provider address the following  immediate concerns for her Mom;   restart medications to control patient's blood pressure and blood sugar, do not give her narcotics that will increase her lethargy and possibly hasten her death.   She requests  that the Rxs be called to a pharmacy that can deliver the medications to the home , provide glucometer to allow blood sugar monitoring by [name],  orders to home health agency for dressing supplies for wound care- she says she is currently purchasing the supplies and they are very expensive, orders for foley catheter change as she says the foley has not been charged in 3 months. Finally,   [name] also wants to know if her mother is eligible  for PACE as she says she has a friend whose parent is also bed bound but is active with PACE.Marland Kitchen  Per [name]'s request will e-mail CCM and primary care provider contact information.  This CCM RN will consult with clinic provider regarding daughter's requests/concerns. The care management team will reach out to the patient again over the next 7-14 days.   Audrey Churn RN, CCM, [facility] RN Care Manager [phone]  Addendum: 3:45 pm- Discussed above with Dr Dorian Pod, Medical Director of the Internal Medicine Clinic . At Dr. Jimmye Norman' suggestion, since patient cannot travel to the Internal Medicine Clinic for assessment,  left message for [facility]  Palliative Care NP [name] at [phone], requesting her assistance with daughter's concerns and  re-establishing/clarifying goals of care.

## 2020-06-14 ENCOUNTER — Telehealth: Payer: Self-pay

## 2020-06-14 NOTE — Telephone Encounter (Signed)
Received a TC from [place] w/ [facility] who states patient is bed-bound, had a telephone visit with PCP on 4/5 for [facility] placement, but telephone visits do not meet insurance requirements.  She states patient will need to be set up for a video visit and this can be done via patient's daughter, Rechel @ [phone] [facility] contact).  Will forward to Lauren, front desk to set up video visit.  Dr. Konrad Penta is back in the office next week. SChaplin, RN,BSN

## 2020-06-15 NOTE — Telephone Encounter (Signed)
Video visit sounds appropriate. Thank you.

## 2020-06-15 NOTE — Progress Notes (Signed)
.  Internal Medicine Clinic Resident  I have personally reviewed this encounter including the documentation in this note and/or discussed this patient with the care management provider. I will address any urgent items identified by the care management provider and will communicate my actions to the patient's PCP. I have reviewed the patient's CCM visit with my supervising attending, Dr Jimmye Norman.  Marianna Payment, MD 06/15/2020

## 2020-06-18 NOTE — Progress Notes (Signed)
Chart reviewed and case discussed with RN Broadus John.  Very difficult situation.  Ms. Couts will need a medical provider who can provide home visits.  Either this would be [facility] or Hospice.  Our practice will not be able to appropriately supervise care without the ability to monitor her condition in person.  I will speak to Dr. Konrad Penta, PCP, to discuss resources and recommendations to help Ms. Torti and her family get the help they need .  We are aware that a palliative care nurse through [facility] has made an initial home visit though do not know the outcome of that encounter (this info will be necessary to assess the current status of Welling's case).

## 2020-06-18 NOTE — Telephone Encounter (Signed)
Attempted to contact patient's daugther today by phone, but no answer.  Appointment made for this Thursday 06/21/2020 at 3:45 pm.  Message left on voicemail making patient aware of appointment date and time and to call back if they need to change.

## 2020-06-21 ENCOUNTER — Telehealth (INDEPENDENT_AMBULATORY_CARE_PROVIDER_SITE_OTHER): Payer: Medicare Other | Admitting: Student

## 2020-06-21 ENCOUNTER — Other Ambulatory Visit: Payer: Self-pay

## 2020-06-21 DIAGNOSIS — E1169 Type 2 diabetes mellitus with other specified complication: Secondary | ICD-10-CM | POA: Diagnosis not present

## 2020-06-21 DIAGNOSIS — L89159 Pressure ulcer of sacral region, unspecified stage: Secondary | ICD-10-CM

## 2020-06-21 DIAGNOSIS — Z531 Procedure and treatment not carried out because of patient's decision for reasons of belief and group pressure: Secondary | ICD-10-CM | POA: Diagnosis not present

## 2020-06-21 DIAGNOSIS — Z794 Long term (current) use of insulin: Secondary | ICD-10-CM | POA: Diagnosis not present

## 2020-06-21 DIAGNOSIS — E43 Unspecified severe protein-calorie malnutrition: Secondary | ICD-10-CM | POA: Diagnosis not present

## 2020-06-21 DIAGNOSIS — L89613 Pressure ulcer of right heel, stage 3: Secondary | ICD-10-CM | POA: Diagnosis not present

## 2020-06-21 DIAGNOSIS — I7025 Atherosclerosis of native arteries of other extremities with ulceration: Secondary | ICD-10-CM | POA: Diagnosis not present

## 2020-06-21 DIAGNOSIS — E7800 Pure hypercholesterolemia, unspecified: Secondary | ICD-10-CM | POA: Diagnosis not present

## 2020-06-21 DIAGNOSIS — I2510 Atherosclerotic heart disease of native coronary artery without angina pectoris: Secondary | ICD-10-CM | POA: Diagnosis not present

## 2020-06-21 DIAGNOSIS — I739 Peripheral vascular disease, unspecified: Secondary | ICD-10-CM

## 2020-06-21 DIAGNOSIS — L89154 Pressure ulcer of sacral region, stage 4: Secondary | ICD-10-CM | POA: Diagnosis not present

## 2020-06-21 DIAGNOSIS — I70229 Atherosclerosis of native arteries of extremities with rest pain, unspecified extremity: Secondary | ICD-10-CM | POA: Diagnosis not present

## 2020-06-21 DIAGNOSIS — G928 Other toxic encephalopathy: Secondary | ICD-10-CM | POA: Diagnosis not present

## 2020-06-21 MED ORDER — CLOPIDOGREL BISULFATE 75 MG PO TABS: 75.0000 mg | ORAL_TABLET | Freq: Every day | ORAL | 3 refills | Status: DC

## 2020-06-21 MED ORDER — ASPIRIN 81 MG PO TBEC: DELAYED_RELEASE_TABLET | ORAL | 3 refills | Status: DC

## 2020-06-21 NOTE — Progress Notes (Signed)
  [facility] Internal Medicine Residency Video Encounter Continuity Care Appointment  HPI:   This video encounter was created for [name] on 06/21/2020 for the following purpose/cc home health insurance approval.   Past Medical History:  Past Medical History:  Diagnosis Date  . Acute on chronic diastolic CHF (congestive heart failure) ([place]) 06/22/2017  . Acute on chronic renal failure ([place]) 05/25/2011  . Adenocarcinoma of breast ([place]) 1996   Completed tamoxifen and had mastectomy.  . Aortic stenosis, severe    s/p aortic valve replacement with porcine valve 06/2004.  ECHO 2010 EF 32%, LVH, diastolic dysfxn, Bioprostetic aoritc valve, mild AS. ECHO 2013 EF 60%, Nl aortic artificial valve, dynamic obstruction in the outflow tract   Class IIb rec for annual TTE after 5 yrs. She had a TTE 2013    . Arthritis   . CAD (coronary artery disease) 2006   s/p CABG (5/06) w/ saphenous vein to RCA at time of AVR  . CKD (chronic kidney disease) stage 2, GFR 60-89 ml/min 06/03/2011   She is no longer taking NSAIDs.   Marland Kitchen CVA (cerebral infarction) 2006   Post-op from AVR. Presumed embolic in nature. Carotid stenosis of R 60-79%. Repeat dopplers 4/10 no R stenosis and L stenosis of 1-29%.  . Depression    Controlled on Paxil  . Diabetes mellitus 1992   Dx 04/25/1990. Now insulin dependent, started 2008. On ACEI.   . Diverticulosis 2001  . History of 2019 novel coronavirus disease (COVID-19)   . Hyperlipidemia    Mgmt with a statin  . Hypertension    Requires 4 drug tx  . Osteoporosis 2006   DEXA 10/06 : L femur T -2.8, R -2.7. Lumbar T -2.4. On bisphosphonates and  Calcium / Vit D.  . Peripheral vascular disease ([place])   . Presence of permanent cardiac pacemaker   . Refusal of blood transfusions as patient is Jehovah's Witness   . Stroke ([place])       ROS:   Positive for: sacral wound with minimal bloody drainage, improving heel wound, decreased PO intake, generalized weakness, fatigue,  single fever (resolved)  Negative for: current fevers or chills, dark or bloody stools, hematuria, vomiting, constipation, diarrhea, abdominal pain, light-headedness, dizziness  All others negative except as noted in assessment and plan.    Assessment / Plan / Recommendations:   Please see A&P under problem oriented charting for assessment of the patient's acute and chronic medical conditions.   As always, pt is advised that if symptoms worsen or new symptoms arise, they should go to an urgent care facility or to to ER for further evaluation.   Consent and Medical Decision Making:   Patient discussed with Dr. Evette Doffing.  This is a video encounter between [name] and Jeralyn Bennett on 06/21/2020 for home health and [facility] service request. The visit was conducted with the patient located at home and Jeralyn Bennett at [facility]. The patient's identity was confirmed using their DOB and current address. The patient has consented to being evaluated through a video encounter and understands the associated risks (an examination cannot be done and the patient may need to come in for an appointment) / benefits (allows the patient to remain at home, decreasing exposure to coronavirus). I personally spent 25 minutes on medical discussion.    Jeralyn Bennett, MD 06/21/2020, 5:27 PM Pager: [phone]

## 2020-06-23 NOTE — Assessment & Plan Note (Signed)
Continue ASA

## 2020-06-23 NOTE — Assessment & Plan Note (Signed)
Daughter states heel ulcer has improved significantly since her recent hospital discharge; however, she will need dressing change assistance and monitoring of this and for other new decubitus ulcers as she remains bedbound.   - Referral placed for home health wound care and PCS

## 2020-06-23 NOTE — Assessment & Plan Note (Signed)
Patient continues to struggle with severe fatigue although lethargy has improved since telephone visit. She was discharged from the hospital last month with Hgb < 6. Confirmed with daughter that patient does not accept blood. She has had scant bloody drainage from her sacral wound, per daughter.   - Continue to monitor very closely for signs and symptoms of bleeding  - If heavy bleeding occurs, discontinue ASA and Plavix  - Consider future CBC

## 2020-06-23 NOTE — Assessment & Plan Note (Signed)
-   Continue ASA $RemoveBefo'81mg'lDtxTltREoD$  daily  - Continue Plavix $RemoveBeforeD'75mg'TtGUzGVkRYPaNq$  daily  - Encouraged daughter and HH to monitor closely for bleeding (wound sites or otherwise) and d/c above agents until follow up if this occurs

## 2020-06-23 NOTE — Assessment & Plan Note (Signed)
Patient is only eating 2 french toast sticks +/- 1 bowl of cereal daily due to decreased appetite. Denies nausea, vomiting or abdominal pain. Patient previously was intended to go to hospice however daughter no longer wishes for this. Likely due to deconditioning, severe anemia, and immobility / chronic conditions.  - Continue to treat  - Encourage PO intake as tolerated

## 2020-06-23 NOTE — Assessment & Plan Note (Signed)
Patient was found to have a 10x12cm stage IV decubitus ulcer on her recent admission. Daughter has been changing her dressings and says she's had scant serosanguinous drainage. HH was not able to be approved due to telephone encounter last visit. Patient remains bed-bound and is fully dependent on her daughter currently for assistance with feeding, dressing, and bathing.   - New order placed for home health assistance with wound care  - In the process of sending in documentation for PCS services  - Daughter is interested in establishing care with [facility] to receive physician services at home; will discuss with staff how we may assist her with this

## 2020-06-23 NOTE — Assessment & Plan Note (Signed)
Patient has recently struggled with toxic encephalopathy; however, her mental status seems to have improved significantly since my last phone visit with her daughter. She is alert and able to hold short conversations and is aware of her current situation. Daughter denies fevers, chills, and states that although sacral wound continues to have scant serosanguinous drainage, heel ulcer is healing well.   - Continue mental status monitoring  - Continue to monitor for S/S of infection

## 2020-06-23 NOTE — Assessment & Plan Note (Signed)
Daughter wonders whether patient should restart diabetic medications. Upon chart review, Hgb A1c has continued to decline, likely in the setting of very poor PO intake. She was last in pre-diabetic range and has more recently had hypoglycemic episodes.   - Discussed she will not require antihyperglycemics unless PO intake significantly improves  - Continue to monitor for hypoglycemia

## 2020-06-23 NOTE — Assessment & Plan Note (Signed)
Daughter wonders whether patient needs to resume cholesterol medication. On chart review, she had been taking Lipitor $RemoveBefore'80mg'eJjlYZeDZhvhL$  daily although her cholesterol had been extremely low and she has had very poor PO intake and functional status.   - Discussed this would not be needed for now  - Consider repeat lipid profile if PO intake improves substantially

## 2020-06-25 NOTE — Progress Notes (Signed)
Internal Medicine Clinic Attending  Case discussed with Dr. Konrad Penta  At the time of the visit.  We reviewed the resident's history and pertinent patient test results.  I agree with the assessment, diagnosis, and plan of care documented in the resident's note.

## 2020-06-27 ENCOUNTER — Telehealth: Payer: Self-pay | Admitting: *Deleted

## 2020-06-27 NOTE — Telephone Encounter (Incomplete)
PCS form faxed to [facility]

## 2020-06-28 ENCOUNTER — Telehealth: Payer: Self-pay

## 2020-06-28 ENCOUNTER — Other Ambulatory Visit: Payer: Medicare Other | Admitting: Hospice

## 2020-06-28 ENCOUNTER — Other Ambulatory Visit: Payer: Self-pay

## 2020-06-28 ENCOUNTER — Ambulatory Visit: Payer: Medicare Other | Admitting: Cardiology

## 2020-06-28 DIAGNOSIS — R627 Adult failure to thrive: Secondary | ICD-10-CM

## 2020-06-28 DIAGNOSIS — Z515 Encounter for palliative care: Secondary | ICD-10-CM

## 2020-06-28 NOTE — Progress Notes (Signed)
Therapist, nutritional Palliative Care Consult Note Telephone: [phone]  Fax: [phone]  PATIENT NAME: [name] DOB: [birth year] MRN: [id number]   PRIMARY CARE PROVIDER:    Glenford Bayley, MD,  [address] [place] [address] [phone]  REFERRING PROVIDER:   Glenford Bayley, MD [address] [place],  [place] [address] [phone]  RESPONSIBLE PARTY:   Marguarite Arbour [phone]  [id number] Information    Name Relation Home Work [place], [place] Daughter   [phone]   Armstrong,[name] Daughter [phone]     Rennis Chris Daughter [phone]  [phone]      Visit is to build trust and highlight Palliative Medicine as specialized medical care for people living with serious illness, aimed at facilitating better quality of life through symptoms relief, assisting with advance care planning and complex medical decision making.   RECOMMENDATIONS/PLAN:   Advance Care Planning/Code Status: Patient is Do not Resuscitate  Goals of Care: Goals of care include to maximize quality of life and symptom management. [name] elects comfort care but said she does not want hospice service. She has applied for [facility] and expects patient to start on [facility] services beginning of next month.  Symptom management/Plan:  Failure to thrive -  ongoing deconditioning and overall decline in functional status. Family had negative experience in the past with in-hospital hospice and does not wish for hospice care, at this time. [name] reports No more medications for patient except for Plavix and aspirin. Comfort care.  Home health service for assistance with activities of daily living. Stage 4 Sacral wound: Home health service for wound care.  Repositioning discussed. Right heel wound: Home health services for wound care.  Repositioning and floating BLE discussed and demonstrated.  NP had reached out to PCP who promptly responded that she will order home  health service as requested. [name] reported home health service not started at this time. During visit, she called PCP office to follow up; expecting call back from PCP office. Daughter does dressing changes and reports  scant serosanguinous drainage.  Epic chart review of telehealth visit 06/21/2020 of PCP with patient shows new order for home health service, in the process of sending in documentation for PCS services.  Fatigue: Energy conservation, cluster activities, provide rest periods; offer small frequent meals and drinks.  Follow up Palliative Care Visit: Palliative care will continue to follow for advance care planning, and clarification of goals. Return 6 weeks or prn. Encouraged to call provider sooner with any concerns, and if patient starts on home health service or with [facility].   CHIEF COMPLAINT: Palliative follow up visit/Failure to thrive  HISTORY OF PRESENT ILLNESS: Follow up visit for [name] a 75 y.o. year old female  with chronic failure to thrive, ongoing since November 2021 worsened in the 2 months patient not eating, only sips and little bites, with associated weakness, abnormal weight loss and overall deconditioning and decline in functional status - bedbound PPS 20% with ongoing skin breakdown - She has right heal ulcer and stage stage 4 sacral wound. She was recently in nursing home following recent hospitalization 04/03/2020-04/11/2020 for  right lower extremity ischemia status post angiography recanalization ANCA tibial artery balloon angioplasty, and  infection of stage IV sacral wound.  Chart review indicates patient underwent general surgery for stage IV sacral ulcer and bedside debridement. Patient is non ambulatory and is totally dependent for ADLs. Patient with history of adenocarcinoma of breast Status post chemotherapy and radiation.  History of CVA, aortic stenosis, CHF, CKD, diabetes mellitus type 2, osteomyelitis of toe, sacral decub decubitus ulcer,  hypertension. History obtained from review of EMR, discussion with  family and/or [name].   Review and summarization of Epic records shows history from other than patient.   Palliative Care was asked to follow this patient by consultation request of No ref. provider found to help address complex decision making in the context of advance care planning and goals of care clarification.   PPS: 20% ROS General: NAD, appropriately dressed Constitution: Denies fever/chills EYES: denies vision changes ENMT: denies Xerostomia,  Cardiovascular: denies chest pain Pulmonary: denies  cough, denies dyspnea  Abdomen: endorses fair appetite, denies constipation or diarrhea GU: denies dysuria MSK:  endorses ROM limitations, no falls reported Skin: endorses pressure wound on sacrum and left heel.  Neurological: endorses weakness, denies pain, denies insomnia Psych: Endorses positive mood Heme/lymph/immuno: denies bruises, no abnormal bleeding   PHYSICAL EXAM  R 18 02 97%RA P62 General: In no acute distress, appropriately dressed Cardiovascular: regular rate and rhythm; no edema in BLE Pulmonary: no cough, no increased work of breathing, normal respiratory effort Abdomen: soft, non tender, no guarding, positive bowel sounds in all quadrants GU:  no suprapubic tenderness, foley cath in place Eyes: Normal lids, no discharge, sclera anicteric ENMT: Moist mucous membranes Musculoskeletal:  weakness, sarcopenia, non ambulatory Skin: sacral pressure wound and stage 2 pressure wound to left heel, dressing clean, dry and intact Psych: non-anxious affect Neurological: Weakness but otherwise non focal Heme/lymph/immuno: no bruises, no bleeding  PERTINENT MEDICATIONS:  Outpatient Encounter Medications as of 06/28/2020  Medication Sig  . acetaminophen (TYLENOL) 325 MG tablet Take 650 mg by mouth daily. Not to exceed 3000 mg in 24 hour per stranding order  . aspirin 81 MG EC tablet TAKE 1 TABLET (81 MG  TOTAL) BY MOUTH DAILY. SWALLOW WHOLE.  . clopidogrel (PLAVIX) 75 MG tablet Take 1 tablet (75 mg total) by mouth daily with breakfast.  . diclofenac Sodium (VOLTAREN) 1 % GEL Apply 4 g topically 4 (four) times daily as needed (Pain). Apply to bilateral hips and knees  . lactulose (CHRONULAC) 10 GM/15ML solution Take 15 mLs (10 g total) by mouth daily as needed for mild constipation. Uptitrate as needed for 1 soft bowel movement per day.  . melatonin 3 MG TABS tablet Take 6 mg by mouth every evening.   No facility-administered encounter medications on file as of 06/28/2020.    HOSPICE ELIGIBILITY/DIAGNOSIS: TBD  PAST MEDICAL HISTORY:  Past Medical History:  Diagnosis Date  . Acute on chronic diastolic CHF (congestive heart failure) ([place]) 06/22/2017  . Acute on chronic renal failure ([place]) 05/25/2011  . Adenocarcinoma of breast ([place]) 1996   Completed tamoxifen and had mastectomy.  . Aortic stenosis, severe    s/p aortic valve replacement with porcine valve 06/2004.  ECHO 2010 EF 31%, LVH, diastolic dysfxn, Bioprostetic aoritc valve, mild AS. ECHO 2013 EF 60%, Nl aortic artificial valve, dynamic obstruction in the outflow tract   Class IIb rec for annual TTE after 5 yrs. She had a TTE 2013    . Arthritis   . CAD (coronary artery disease) 2006   s/p CABG (5/06) w/ saphenous vein to RCA at time of AVR  . CKD (chronic kidney disease) stage 2, GFR 60-89 ml/min 06/03/2011   She is no longer taking NSAIDs.   Marland Kitchen CVA (cerebral infarction) 2006   Post-op from AVR. Presumed embolic in nature. Carotid stenosis of R 60-79%.  Repeat dopplers 4/10 no R stenosis and L stenosis of 1-29%.  . Depression    Controlled on Paxil  . Diabetes mellitus 1992   Dx 04/25/1990. Now insulin dependent, started 2008. On ACEI.   . Diverticulosis 2001  . History of 2019 novel coronavirus disease (COVID-19)   . Hyperlipidemia    Mgmt with a statin  . Hypertension    Requires 4 drug tx  . Osteoporosis 2006   DEXA 10/06 : L femur  T -2.8, R -2.7. Lumbar T -2.4. On bisphosphonates and  Calcium / Vit D.  . Peripheral vascular disease ([place])   . Presence of permanent cardiac pacemaker   . Refusal of blood transfusions as patient is Jehovah's Witness   . Stroke [facility])      SOCIAL HX: $RemoveBe'@SOCX'SdSliXkKA$  Patient lives at home for ongoing care  FAMILY HX:  Family History  Problem Relation Age of Onset  . Diabetes Mother   . Hypertension Mother   . Alzheimer's disease Mother   . Heart disease Father 67       AMI at age 63 and 39  . Mental illness Sister   . Heart disease Sister 70       AMI  . Kidney disease Sister     ALLERGIES:  Allergies  Allergen Reactions  . Doxycycline     Made her stopped eating   . 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," as the patient is a Jehovah's Witness      I spent 55 minutes providing this consultation; this includes time spent with patient/family, chart review and documentation. More than 50% of the time in this consultation was spent on counseling and coordinating communication   Thank you for the opportunity to participate in the care of [name] Please call our office at [phone] if we can be of additional assistance.  Note: Portions of this note were generated with Lobbyist. Dictation errors may occur despite best attempts at proofreading.  Teodoro Spray, NP

## 2020-06-28 NOTE — Telephone Encounter (Signed)
Pls contact pt daughter [phone]

## 2020-06-28 NOTE — Telephone Encounter (Signed)
RTC, Daughter, Ernst Bowler [facility]), is wanting to know when PCS will start.  States she had a video visit with Dr. Konrad Penta on 4/28 and was told this would start up soon.  She states she hasn't heard anything and she needs help. Forwarding to HH/PCS team. Laurence Compton, RN,BSN

## 2020-06-29 ENCOUNTER — Ambulatory Visit: Payer: Medicare Other | Admitting: *Deleted

## 2020-06-29 DIAGNOSIS — I2510 Atherosclerotic heart disease of native coronary artery without angina pectoris: Secondary | ICD-10-CM

## 2020-06-29 DIAGNOSIS — Z794 Long term (current) use of insulin: Secondary | ICD-10-CM

## 2020-06-29 DIAGNOSIS — E1169 Type 2 diabetes mellitus with other specified complication: Secondary | ICD-10-CM

## 2020-06-29 DIAGNOSIS — L89159 Pressure ulcer of sacral region, unspecified stage: Secondary | ICD-10-CM

## 2020-06-29 DIAGNOSIS — I739 Peripheral vascular disease, unspecified: Secondary | ICD-10-CM

## 2020-06-29 NOTE — Chronic Care Management (AMB) (Signed)
  Care Management   Note  06/29/2020 Name: TANEIL LAZARUS MRN: [id number] DOB: 12/12/1945  Thurnell Lose is enrolled in a Managed Medicaid plan: No.  Patient's daughter requesting assistance with applying for personal care services so have asked clinic BSW Milus Height to assist with application.   The care management team will reach out to the patient again over the next 30 days.   Kelli Churn RN, CCM, [facility] RN Care Manager [phone]

## 2020-07-02 ENCOUNTER — Ambulatory Visit: Payer: Medicare Other | Admitting: *Deleted

## 2020-07-02 ENCOUNTER — Other Ambulatory Visit: Payer: Self-pay | Admitting: Internal Medicine

## 2020-07-02 DIAGNOSIS — L89154 Pressure ulcer of sacral region, stage 4: Secondary | ICD-10-CM

## 2020-07-02 DIAGNOSIS — Z7409 Other reduced mobility: Secondary | ICD-10-CM

## 2020-07-02 DIAGNOSIS — E7800 Pure hypercholesterolemia, unspecified: Secondary | ICD-10-CM

## 2020-07-02 DIAGNOSIS — L89613 Pressure ulcer of right heel, stage 3: Secondary | ICD-10-CM

## 2020-07-02 DIAGNOSIS — I2510 Atherosclerotic heart disease of native coronary artery without angina pectoris: Secondary | ICD-10-CM

## 2020-07-02 DIAGNOSIS — E43 Unspecified severe protein-calorie malnutrition: Secondary | ICD-10-CM

## 2020-07-02 DIAGNOSIS — I739 Peripheral vascular disease, unspecified: Secondary | ICD-10-CM

## 2020-07-02 DIAGNOSIS — L89159 Pressure ulcer of sacral region, unspecified stage: Secondary | ICD-10-CM

## 2020-07-02 DIAGNOSIS — E1169 Type 2 diabetes mellitus with other specified complication: Secondary | ICD-10-CM

## 2020-07-02 NOTE — Chronic Care Management (AMB) (Addendum)
Care Management    RN Visit Note  07/02/2020 Name: [name] MRN: [id number] DOB: [birth year]  Subjective: [name] is a 75 y.o. year old female who is a primary care patient of Audrey Bennett, MD. The care management team was consulted for assistance with disease management and care coordination needs.    Engaged with patient's daughter [name] by telephone for follow up visit in response to provider referral for case management and/or care coordination services.   Consent to Services:   [name] was given information about Care Management services today including:  1. Care Management services includes personalized support from designated clinical staff supervised by her physician, including individualized plan of care and coordination with other care providers 2. 24/7 contact phone numbers for assistance for urgent and routine care needs. 3. The patient may stop case management services at any time by phone call to the office staff.  Patient agreed to services and consent obtained.   Assessment: Review of patient past medical history, allergies, medications, health status, including review of consultants reports, laboratory and other test data, was performed as part of comprehensive evaluation and provision of chronic care management services.   SDOH (Social Determinants of Health) assessments and interventions performed:    Care Plan  Allergies  Allergen Reactions  . Doxycycline     Made her stopped eating   . 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," as the patient is a Jehovah's Witness    Outpatient Encounter Medications as of 07/02/2020  Medication Sig  . acetaminophen (TYLENOL) 325 MG tablet Take 650 mg by mouth daily. Not to exceed 3000 mg in 24 hour per stranding order  . aspirin 81 MG EC tablet TAKE 1 TABLET (81 MG TOTAL) BY MOUTH DAILY. SWALLOW WHOLE.  . clopidogrel (PLAVIX) 75 MG tablet Take 1 tablet (75 mg total) by mouth daily with  breakfast.  . diclofenac Sodium (VOLTAREN) 1 % GEL Apply 4 g topically 4 (four) times daily as needed (Pain). Apply to bilateral hips and knees  . lactulose (CHRONULAC) 10 GM/15ML solution Take 15 mLs (10 g total) by mouth daily as needed for mild constipation. Uptitrate as needed for 1 soft bowel movement per day.  . melatonin 3 MG TABS tablet Take 6 mg by mouth every evening.   No facility-administered encounter medications on file as of 07/02/2020.    Patient Active Problem List   Diagnosis Date Noted  . Severe anemia   . Hypoalbuminemia   . Protein-calorie malnutrition, severe 05/11/2020  . Anasarca   . Catheter-associated urinary tract infection ([place]) 05/09/2020  . Toxic metabolic encephalopathy 69/48/5462  . Hypothermia 05/09/2020  . Decubitus ulcer of right heel, stage 3 ([place]) 05/09/2020  . Open wound of heel 04/03/2020  . Bleeding 01/27/2020  . CKD (chronic kidney disease) stage 3, GFR 30-59 ml/min (HCC) 01/27/2020  . Critical lower limb ischemia ([place]) 01/04/2020  . Neurocognitive deficits 08/04/2019  . CVA (cerebral vascular accident) ([place]) 07/23/2019  . Fever   . History of transcatheter aortic valve replacement (TAVR)   . Sacral decubitus ulcer, stage IV ([place]) 07/18/2019  . Sepsis ([place]) 07/18/2019  . Complete heart block ([place])   . Chronic diastolic congestive heart failure ([place]) 02/07/2019  . AKI (acute kidney injury) ([place]) 07/22/2017  . Hypoglycemia 07/21/2017  . Peripheral arterial disease ([place]) 06/18/2017  . B12 deficiency 05/01/2017  . Bilateral lower extremity edema 11/08/2015  . Aortic atherosclerosis ([place]) 06/07/2014  . Abnormality of  gait 05/29/2012  . Constipation 03/16/2012  . HOCM (hypertrophic obstructive cardiomyopathy) ([place]) 06/11/2011  . Goals of care, counseling/discussion 06/10/2010  . Hyperlipidemia   . Refusal of blood transfusions as patient is Jehovah's Witness   . History of adenocarcinoma of breast   . Osteoporosis   . Status post CVA   .  Aortic stenosis s/p Tissure AVR 2006   . CAD (coronary artery disease)   . Hypertension   . Depression 01/23/2006  . DM2 (diabetes mellitus, type 2) ([place]) 03/25/1990  . Benign hypertensive heart and kidney disease with diastolic CHF, NYHA class 3 and CKD stage 3 ([place]) 1992  . Bradycardia 1992    Conditions to be addressed/monitored: DM, HF, HTN, CKD, HLD, CAD, PVD, skin impairment, severe deconditioning and failure to thrive   Care Plan : Frailty Syndrome (Adult)  Updates made by Audrey Ellison, RN since 07/02/2020 12:00 AM    Problem: Functional Decline (Frailty)     Long-Range Goal: Functional Decline Minimized   Start Date: 07/02/2020  This Visit's Progress: On track  Priority: High  Note:   Current Barriers:  . Chronic Disease Management support, education, and care coordination needs related to CHF, HTN, DM, and severe deconditioning and failure to thrive - spoke with patient's daughter who is also patient's health care agent, Ms [name] agrees to home health services via [facility]- formally [name]- as patient received services from same agency last year, she is also requesting home health aide for bathing assistance in addition to [facility] services until patient is transitioned [facility] care of [facility] in a few weeks . Unable to self administer medications as prescribed . Unable to perform ADLs independently . Unable to perform IADLs independently Case Manager Clinical Goal(s):  Patient will work with BSW to address needs related to Inability to perform ADL's independently and Inability to perform IADL's independently in patient withDM, HF, HTN, CKD, HLD, CAD, PVD, skin impairment, severe deconditioning and failure to thrive  Interventions:   Collaborated with BSW to initiate plan of care to address needs related to Inability to perform ADL's independently and Inability to perform IADL's independently in patient withDM, HF, HTN, CKD, HLD, CAD, PVD, skin impairment, severe deconditioning and  failure to thrive   Consulted with [name], [facility] Director re; patient's need for home health services until patient's care is transitioned to [facility] (in about 3 weeks)  Collaboration with Audrey. Dorian Pod regarding development and update of comprehensive plan of care as evidenced by provider attestation and co-signature  [facility] at [phone] to verify fax then orders for home health services of RN, PT/OT and home health aide successfully faxed to [facility] at [phone] and requested agency contact this CCM RN for start of services date  Notified daughter [name] of home health orders  Left message with [facility] re: home health  Orders . Received message from Santiago Glad at [facility] that they do not have a home health aide and asking for return call from this CCM RN to advise whether to proceed with referral to [facility] or send referral to another agency that can provide all requested home health services  . Community message sent to [facility] re: home health orders; received reply at 4:54 pm from [facility] that they will accept referral and reach out to patient's family today and that home health services should begin in next 48 hours  Inter-disciplinary care team collaboration (see longitudinal plan of care) Patient Goals/Self-Care Activities .    -  Patient will work with BSW to address care coordination needs and will continue to work with the clinical team to address health care and disease management related needs.   Follow Up Plan: The care management team will reach out to the patient again over the next 30 days.       Kelli Churn RN, CCM, [facility] RN Care Manager [phone]

## 2020-07-03 ENCOUNTER — Ambulatory Visit: Payer: Medicare Other | Admitting: Licensed Clinical Social Worker

## 2020-07-03 NOTE — Patient Instructions (Signed)
Visit Information  Instructions:   Patient was given the following information about care management and care coordination services today, agreed to services, and gave verbal consent: 1.care management/care coordination services include personalized support from designated clinical staff supervised by their physician, including individualized plan of care and coordination with other care providers 2. 24/7 contact phone numbers for assistance for urgent and routine care needs. 3. The patient may stop care management/care coordination services at any time by phone call to the office staff.  Patient verbalizes understanding of instructions provided today and agrees to view in [place].   Telephone follow up appointment with care management team member scheduled for: August 03, 2020  Milus Height, [place]  Social Worker [facility] Care Management  [phone]

## 2020-07-03 NOTE — Chronic Care Management (AMB) (Signed)
  Care Management   Social Work Visit Note  07/03/2020 Name: [name] MRN: [id number] DOB: 11-17-1945  [name] is a 75 y.o. year old female who sees Jeralyn Bennett, MD for primary care. The care management team was consulted for assistance with care management and care coordination needs related to [facility] services   Patient was given the following information about care management and care coordination services today, agreed to services, and gave verbal consent: 1.care management/care coordination services include personalized support from designated clinical staff supervised by their physician, including individualized plan of care and coordination with other care providers 2. 24/7 contact phone numbers for assistance for urgent and routine care needs. 3. The patient may stop care management/care coordination services at any time by phone call to the office staff.  SW spoke with [name] ( daughter). [name] advised she was contacted by [facility] services on 07/03/2020. AudreyMoran was advised services would begin this week. [name] advised [name], but services wont begin until June 2022.    Assessment: Review of patient history, allergies, and health status during evaluation of patient need for care management/care coordination services.    Interventions:  . Patient interviewed and appropriate assessments performed . Collaborated with clinical team regarding patient needs  . Advised patient to contact SW with any additional concerns       Plan:  . Social Worker will follow up with patitent on August 03, 2020. Milus Height, [place]  Social Worker [facility] Care Management  [phone]

## 2020-07-03 NOTE — Telephone Encounter (Signed)
CCM has followed up with patient regarding this.

## 2020-07-04 ENCOUNTER — Telehealth: Payer: Self-pay

## 2020-07-04 NOTE — Telephone Encounter (Signed)
Amy with [facility] requesting to speak with a nurse. Please call back.

## 2020-07-04 NOTE — Telephone Encounter (Signed)
Returned call to Amy, RN with [facility]. She called patient's daughter to schedule [facility] but what daughter is looking for is PCS and respite care. Amy could go out once weekly for ~40 minutes for wound care but [facility] does not have a Bath Aide at this time. Patient's PCS will not start for 3-4 weeks and daughter needs help now. Amy suggests contacting other [facility] agencies to see if they have a [employer] that they can send twice weekly in addition to RN. Will forward to CCM team to assist patient/daughter with this.

## 2020-07-09 ENCOUNTER — Ambulatory Visit: Payer: Medicare Other | Admitting: *Deleted

## 2020-07-09 DIAGNOSIS — I2510 Atherosclerotic heart disease of native coronary artery without angina pectoris: Secondary | ICD-10-CM

## 2020-07-09 DIAGNOSIS — Z794 Long term (current) use of insulin: Secondary | ICD-10-CM

## 2020-07-09 DIAGNOSIS — E43 Unspecified severe protein-calorie malnutrition: Secondary | ICD-10-CM

## 2020-07-09 DIAGNOSIS — Z7409 Other reduced mobility: Secondary | ICD-10-CM

## 2020-07-09 DIAGNOSIS — L89159 Pressure ulcer of sacral region, unspecified stage: Secondary | ICD-10-CM

## 2020-07-09 DIAGNOSIS — I739 Peripheral vascular disease, unspecified: Secondary | ICD-10-CM

## 2020-07-09 DIAGNOSIS — E1169 Type 2 diabetes mellitus with other specified complication: Secondary | ICD-10-CM

## 2020-07-09 NOTE — Chronic Care Management (AMB) (Signed)
Care Management    RN Visit Note  07/09/2020 Name: [name] MRN: [id number] DOB: 29-Aug-1945  Subjective: [name] is a 75 y.o. year old female who is a primary care patient of Jeralyn Bennett, MD. The care management team was consulted for assistance with disease management and care coordination needs.     Consent to Services:   [name] was given information about Care Management services today including:  1. Care Management services includes personalized support from designated clinical staff supervised by her physician, including individualized plan of care and coordination with other care providers 2. 24/7 contact phone numbers for assistance for urgent and routine care needs. 3. The patient may stop case management services at any time by phone call to the office staff.  Patient agreed to services and consent obtained.   Assessment: Review of patient past medical history, allergies, medications, health status, including review of consultants reports, laboratory and other test data, was performed as part of comprehensive evaluation and provision of chronic care management services.   SDOH (Social Determinants of Health) assessments and interventions performed:    Care Plan  Allergies  Allergen Reactions  . Doxycycline     Made her stopped eating   . 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," as the patient is a Jehovah's Witness    Outpatient Encounter Medications as of 07/09/2020  Medication Sig  . acetaminophen (TYLENOL) 325 MG tablet Take 650 mg by mouth daily. Not to exceed 3000 mg in 24 hour per stranding order  . aspirin 81 MG EC tablet TAKE 1 TABLET (81 MG TOTAL) BY MOUTH DAILY. SWALLOW WHOLE.  . clopidogrel (PLAVIX) 75 MG tablet Take 1 tablet (75 mg total) by mouth daily with breakfast.  . diclofenac Sodium (VOLTAREN) 1 % GEL Apply 4 g topically 4 (four) times daily as needed (Pain). Apply to bilateral hips and knees  . lactulose  (CHRONULAC) 10 GM/15ML solution Take 15 mLs (10 g total) by mouth daily as needed for mild constipation. Uptitrate as needed for 1 soft bowel movement per day.  . melatonin 3 MG TABS tablet Take 6 mg by mouth every evening.   No facility-administered encounter medications on file as of 07/09/2020.    Patient Active Problem List   Diagnosis Date Noted  . Severe anemia   . Hypoalbuminemia   . Protein-calorie malnutrition, severe 05/11/2020  . Anasarca   . Catheter-associated urinary tract infection ([place]) 05/09/2020  . Toxic metabolic encephalopathy 81/11/3157  . Hypothermia 05/09/2020  . Decubitus ulcer of right heel, stage 3 ([place]) 05/09/2020  . Open wound of heel 04/03/2020  . Bleeding 01/27/2020  . CKD (chronic kidney disease) stage 3, GFR 30-59 ml/min (HCC) 01/27/2020  . Critical lower limb ischemia ([place]) 01/04/2020  . Neurocognitive deficits 08/04/2019  . CVA (cerebral vascular accident) ([place]) 07/23/2019  . Fever   . History of transcatheter aortic valve replacement (TAVR)   . Sacral decubitus ulcer, stage IV ([place]) 07/18/2019  . Sepsis ([place]) 07/18/2019  . Complete heart block ([place])   . Chronic diastolic congestive heart failure ([place]) 02/07/2019  . AKI (acute kidney injury) ([place]) 07/22/2017  . Hypoglycemia 07/21/2017  . Peripheral arterial disease ([place]) 06/18/2017  . B12 deficiency 05/01/2017  . Bilateral lower extremity edema 11/08/2015  . Aortic atherosclerosis ([place]) 06/07/2014  . Abnormality of gait 05/29/2012  . Constipation 03/16/2012  . HOCM (hypertrophic obstructive cardiomyopathy) ([place]) 06/11/2011  . Goals of care, counseling/discussion 06/10/2010  . Hyperlipidemia   .  Refusal of blood transfusions as patient is Jehovah's Witness   . History of adenocarcinoma of breast   . Osteoporosis   . Status post CVA   . Aortic stenosis s/p Tissure AVR 2006   . CAD (coronary artery disease)   . Hypertension   . Depression 01/23/2006  . DM2 (diabetes mellitus, type 2) ([place])  03/25/1990  . Benign hypertensive heart and kidney disease with diastolic CHF, NYHA class 3 and CKD stage 3 ([place]) 1992  . Bradycardia 1992    Conditions to be addressed/monitored: DM, HF, HTN, CKD, HLD, CAD, PVD, skin impairment  Care Plan : Frailty Syndrome (Adult)  Updates made by [place] Ellison, RN since 07/09/2020 12:00 AM    Problem: Functional Decline (Frailty)     Long-Range Goal: Functional Decline Minimized   Start Date: 07/02/2020  Recent Progress: On track  Priority: High  Note:   Current Barriers:  . Chronic Disease Management support, education, and care coordination needs related to DM, HF, HTN, CKD, HLD, CAD, PVD, skin impairment, severe deconditioning and failure to thrive- spoke with patient's Audrey who is also patient's health care agent, [name] agrees to home health services via [facility]- formally [facility]- as patient received services from same agency last year, she is also requesting home health aide for bathing assistance in addition to [facility] services until patient is transitioned [facility] care of [facility] in a few weeks . Unable to self administer medications as prescribed . Unable to perform ADLs independently . Unable to perform IADLs independently Case Manager Clinical Goal(s):  . patient will work with BSW to address needs related to Inability to perform ADL's independently and Inability to perform IADL's independently in patient with DM, HF, HTN, CKD, HLD, CAD, PVD, skin impairment, severe deconditioning and failure to thrive Interventions:  . Collaborated with BSW to initiate plan of care to address needs related to Inability to perform ADL's independently and Inability to perform IADL's independently in patient with DM, HF, HTN, CKD, HLD, CAD, PVD, skin impairment, severe deconditioning and failure to thrive . Consulted with Dr Jimmye Norman, [facility] Director re; patient's need for home health services until patient's [facility] is [facility]ed to [facility]  (in about 3 weeks) . Collaboration with Dr. Dorian Pod regarding development and update of comprehensive plan of care as evidenced by provider attestation and co-signature . [facility] at [phone] to verify fax then orders for home health services of RN, PT/OT and home health aide successfully faxed to [facility] at [phone] and requested agency contact this CCM RN for start of services date . Notified [name] of home health orders . Left message with [facility] re: home health  orders . Received message from Santiago Glad at [facility] that they do not have a home health aide and asking for return call from this CCM RN to advise whether to proceed with referral to [facility] or send referral to another agency that can provide all requested home health services  . Community message sent to [facility] re: home health orders; received reply at 4:54 pm from [facility] that they will accept referral and reach out to patient's family today and that home health services should begin in next 48 hours  . In response to message received via [facility] on 5/11 that [facility] does not have bathe aide, called Sharmon Revere at [facility] at [phone] to see if they can provide services. Malachy Mood called patient's Audrey. [name], and [name]  told Malachy Mood someone from [facility] is coming out today. Malachy Mood said she requested [name] call her back if [facility] is unable to provider all home health services requested including a bath aide. Malachy Mood said [facility] will take the referral and use their OT techs to help with bathing patient until she goes under the services of [facility] and [facility] if the Audrey requests that [facility] provides the home health services.   Bertram Savin care team collaboration (see longitudinal plan of care) Patient Goals/Self-Care Activities .    - Patient will work with BSW to address care coordination needs and will  continue to work with the clinical team to address health care and disease management related needs.   Follow Up Plan: The care management team will reach out to the patient again over the next 30 days.         Kelli Churn RN, CCM, [facility] RN Care Manager [phone]

## 2020-07-10 ENCOUNTER — Ambulatory Visit (INDEPENDENT_AMBULATORY_CARE_PROVIDER_SITE_OTHER): Payer: Medicare Other

## 2020-07-10 DIAGNOSIS — S91309D Unspecified open wound, unspecified foot, subsequent encounter: Secondary | ICD-10-CM | POA: Diagnosis not present

## 2020-07-13 ENCOUNTER — Ambulatory Visit: Payer: Medicare Other | Admitting: *Deleted

## 2020-07-13 DIAGNOSIS — I2510 Atherosclerotic heart disease of native coronary artery without angina pectoris: Secondary | ICD-10-CM

## 2020-07-13 DIAGNOSIS — E1169 Type 2 diabetes mellitus with other specified complication: Secondary | ICD-10-CM

## 2020-07-13 DIAGNOSIS — I739 Peripheral vascular disease, unspecified: Secondary | ICD-10-CM

## 2020-07-13 DIAGNOSIS — E43 Unspecified severe protein-calorie malnutrition: Secondary | ICD-10-CM

## 2020-07-13 DIAGNOSIS — Z7409 Other reduced mobility: Secondary | ICD-10-CM

## 2020-07-13 DIAGNOSIS — L89159 Pressure ulcer of sacral region, unspecified stage: Secondary | ICD-10-CM

## 2020-07-13 NOTE — Chronic Care Management (AMB) (Signed)
Care Management    RN Visit Note  07/13/2020 Name: [name] MRN: [id number] DOB: 15-Sep-1945  Subjective: [name] is a 75 y.o. year old female who is a primary care patient of No primary care provider on file.. The care management team was consulted for assistance with disease management and care coordination needs.    Consent to Services:   [name] was given information about Care Management services today including:  1. Care Management services includes personalized support from designated clinical staff supervised by her physician, including individualized plan of care and coordination with other care providers 2. 24/7 contact phone numbers for assistance for urgent and routine care needs. 3. The patient may stop case management services at any time by phone call to the office staff.  Patient agreed to services and consent obtained.   Assessment: Review of patient past medical history, allergies, medications, health status, including review of consultants reports, laboratory and other test data, was performed as part of comprehensive evaluation and provision of chronic care management services.   SDOH (Social Determinants of Health) assessments and interventions performed:    Care Plan  Allergies  Allergen Reactions  . Doxycycline     Made her stopped eating   . 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," as the patient is a Jehovah's Witness    Outpatient Encounter Medications as of 07/13/2020  Medication Sig  . acetaminophen (TYLENOL) 325 MG tablet Take 650 mg by mouth daily. Not to exceed 3000 mg in 24 hour per stranding order  . aspirin 81 MG EC tablet TAKE 1 TABLET (81 MG TOTAL) BY MOUTH DAILY. SWALLOW WHOLE.  . clopidogrel (PLAVIX) 75 MG tablet Take 1 tablet (75 mg total) by mouth daily with breakfast.  . diclofenac Sodium (VOLTAREN) 1 % GEL Apply 4 g topically 4 (four) times daily as needed (Pain). Apply to bilateral hips and knees  . lactulose  (CHRONULAC) 10 GM/15ML solution Take 15 mLs (10 g total) by mouth daily as needed for mild constipation. Uptitrate as needed for 1 soft bowel movement per day.  . melatonin 3 MG TABS tablet Take 6 mg by mouth every evening.   No facility-administered encounter medications on file as of 07/13/2020.    Patient Active Problem List   Diagnosis Date Noted  . Severe anemia   . Hypoalbuminemia   . Protein-calorie malnutrition, severe 05/11/2020  . Anasarca   . Catheter-associated urinary tract infection ([place]) 05/09/2020  . Toxic metabolic encephalopathy 76/81/1572  . Hypothermia 05/09/2020  . Decubitus ulcer of right heel, stage 3 ([place]) 05/09/2020  . Open wound of heel 04/03/2020  . Bleeding 01/27/2020  . CKD (chronic kidney disease) stage 3, GFR 30-59 ml/min (HCC) 01/27/2020  . Critical lower limb ischemia ([place]) 01/04/2020  . Neurocognitive deficits 08/04/2019  . CVA (cerebral vascular accident) ([place]) 07/23/2019  . Fever   . History of transcatheter aortic valve replacement (TAVR)   . Sacral decubitus ulcer, stage IV ([place]) 07/18/2019  . Sepsis ([place]) 07/18/2019  . Complete heart block ([place])   . Chronic diastolic congestive heart failure ([place]) 02/07/2019  . AKI (acute kidney injury) ([place]) 07/22/2017  . Hypoglycemia 07/21/2017  . Peripheral arterial disease ([place]) 06/18/2017  . B12 deficiency 05/01/2017  . Bilateral lower extremity edema 11/08/2015  . Aortic atherosclerosis ([place]) 06/07/2014  . Abnormality of gait 05/29/2012  . Constipation 03/16/2012  . HOCM (hypertrophic obstructive cardiomyopathy) ([place]) 06/11/2011  . Goals of care, counseling/discussion 06/10/2010  .  Hyperlipidemia   . Refusal of blood transfusions as patient is Jehovah's Witness   . History of adenocarcinoma of breast   . Osteoporosis   . Status post CVA   . Aortic stenosis s/p Tissure AVR 2006   . CAD (coronary artery disease)   . Hypertension   . Depression 01/23/2006  . DM2 (diabetes mellitus, type 2) ([place])  03/25/1990  . Benign hypertensive heart and kidney disease with diastolic CHF, NYHA class 3 and CKD stage 3 ([place]) 1992  . Bradycardia 1992    Conditions to be addressed/monitored: DM, HF, HTN, CKD, HLD, CAD, PVD, skin impairment  Care Plan : Frailty Syndrome (Adult)  Updates made by Barrington Ellison, RN since 07/13/2020 12:00 AM    Problem: Functional Decline (Frailty)     Long-Range Goal: Functional Decline Minimized   Start Date: 07/02/2020  Recent Progress: On track  Priority: High  Note:   Current Barriers:  . Chronic Disease Management support, education, and care coordination needs related to DM, HF, HTN, CKD, HLD, CAD, PVD, skin impairment, severe deconditioning and failure to thrive- . Unable to self administer medications as prescribed . Unable to perform ADLs independently . Unable to perform IADLs independently Case Manager Clinical Goal(s):  . patient will work with BSW to address needs related to Inability to perform ADL's independently and Inability to perform IADL's independently in patient with DM, HF, HTN, CKD, HLD, CAD, PVD, skin impairment, severe deconditioning and failure to thrive Interventions:  . Collaborated with BSW to initiate plan of care to address needs related to Inability to perform ADL's independently and Inability to perform IADL's independently in patient with DM, HF, HTN, CKD, HLD, CAD, PVD, skin impairment, severe deconditioning and failure to thrive . Consulted with Dr Jimmye Norman, [facility] Director re; patient's need for home health services until patient's care is transitioned to [facility] (in about 3 weeks) . Collaboration with Dr. Dorian Pod regarding development and update of comprehensive plan of care as evidenced by provider attestation and co-signature . [place] at [phone] to verify fax then orders for home health services of RN, PT/OT and home health aide successfully faxed to [facility] at [phone] and  requested agency contact this CCM RN for start of services date . Notified daughter [name] of home health orders . Left message with [facility] re: home health  orders . Received message from Santiago Glad at [place] that they do not have a home health aide and asking for return call from this CCM RN to advise whether to proceed with referral to [facility] or send referral to another agency that can provide all requested home health services  . Community message sent to [place] re: home health orders; received reply at 4:54 pm from [facility] that they will accept referral and reach out to patient's family today and that home health services should begin in next 48 hours  . In response to message received via In Basket on 5/11 that [place] does not have bathe aide, called Sharmon Revere at [facility] at [phone] to see if they can provide services. Malachy Mood called patient's daughter. [name], and [name] told Malachy Mood someone from [facility] is coming out today. Malachy Mood said she requested [name] call her back if [facility] [facility] is unable to provider all home health services requested including a bath aide. Malachy Mood said [facility] will take the referral and use their OT techs to help with bathing patient  until she goes under the services of [facility] and [facility] if the daughter requests that [facility] provides the home health services.   . 07/13/20- Verified with Candi Leash referral coordinator, contact number [phone],  with [facility] that home health services started with agency on 07/06/20, advised Hoyle Sauer to have [facility] staff contact this CCM RN for any additional needs related to home  health orders, etc . 07/13/20- Returned call to Tessie Eke  at [facility] after she left message for this CCM RN on 07/11/20 requesting clarification of home health services and agency providing services prior to patient's call transitioning to [facility]; provided her with  update that [facility] started services on 07/06/20 . Inter-disciplinary care team collaboration (see longitudinal plan of care) Patient Goals/Self-Care Activities .    - Patient will work with BSW to address care coordination needs and will continue to work with the clinical team to address health care and disease management related needs.   Follow Up Plan: The care management team will reach out to the patient again over the next 30 days.      Kelli Churn RN, CCM, [facility] RN Care Manager [phone]

## 2020-07-16 ENCOUNTER — Ambulatory Visit: Payer: Medicare Other | Admitting: *Deleted

## 2020-07-16 DIAGNOSIS — E1169 Type 2 diabetes mellitus with other specified complication: Secondary | ICD-10-CM

## 2020-07-16 DIAGNOSIS — I2510 Atherosclerotic heart disease of native coronary artery without angina pectoris: Secondary | ICD-10-CM

## 2020-07-16 DIAGNOSIS — Z7409 Other reduced mobility: Secondary | ICD-10-CM

## 2020-07-16 DIAGNOSIS — Z794 Long term (current) use of insulin: Secondary | ICD-10-CM

## 2020-07-16 DIAGNOSIS — E43 Unspecified severe protein-calorie malnutrition: Secondary | ICD-10-CM

## 2020-07-16 DIAGNOSIS — L89159 Pressure ulcer of sacral region, unspecified stage: Secondary | ICD-10-CM

## 2020-07-16 DIAGNOSIS — I739 Peripheral vascular disease, unspecified: Secondary | ICD-10-CM

## 2020-07-16 NOTE — Chronic Care Management (AMB) (Signed)
Care Management    RN Visit Note  07/16/2020 Name: [name] MRN: [id number] DOB: 1945/04/10  Subjective: [name] is a 75 y.o. year old female who is a primary care patient of No primary care provider on file.. The care management team was consulted for assistance with disease management and care coordination needs.    Consent to Services:   [name] was given information about Care Management services today including:  1. Care Management services includes personalized support from designated clinical staff supervised by her physician, including individualized plan of care and coordination with other care providers 2. 24/7 contact phone numbers for assistance for urgent and routine care needs. 3. The patient may stop case management services at any time by phone call to the office staff.  Patient agreed to services and consent obtained.   Assessment: Review of patient past medical history, allergies, medications, health status, including review of consultants reports, laboratory and other test data, was performed as part of comprehensive evaluation and provision of chronic care management services.   SDOH (Social Determinants of Health) assessments and interventions performed:    Care Plan  Allergies  Allergen Reactions  . Doxycycline     Made her stopped eating   . 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," as the patient is a Jehovah's Witness    Outpatient Encounter Medications as of 07/16/2020  Medication Sig  . acetaminophen (TYLENOL) 325 MG tablet Take 650 mg by mouth daily. Not to exceed 3000 mg in 24 hour per stranding order  . aspirin 81 MG EC tablet TAKE 1 TABLET (81 MG TOTAL) BY MOUTH DAILY. SWALLOW WHOLE.  . clopidogrel (PLAVIX) 75 MG tablet Take 1 tablet (75 mg total) by mouth daily with breakfast.  . diclofenac Sodium (VOLTAREN) 1 % GEL Apply 4 g topically 4 (four) times daily as needed (Pain). Apply to bilateral hips and knees  . lactulose  (CHRONULAC) 10 GM/15ML solution Take 15 mLs (10 g total) by mouth daily as needed for mild constipation. Uptitrate as needed for 1 soft bowel movement per day.  . melatonin 3 MG TABS tablet Take 6 mg by mouth every evening.   No facility-administered encounter medications on file as of 07/16/2020.    Patient Active Problem List   Diagnosis Date Noted  . Severe anemia   . Hypoalbuminemia   . Protein-calorie malnutrition, severe 05/11/2020  . Anasarca   . Catheter-associated urinary tract infection ([place]) 05/09/2020  . Toxic metabolic encephalopathy 38/18/2993  . Hypothermia 05/09/2020  . Decubitus ulcer of right heel, stage 3 ([place]) 05/09/2020  . Open wound of heel 04/03/2020  . Bleeding 01/27/2020  . CKD (chronic kidney disease) stage 3, GFR 30-59 ml/min (HCC) 01/27/2020  . Critical lower limb ischemia ([place]) 01/04/2020  . Neurocognitive deficits 08/04/2019  . CVA (cerebral vascular accident) ([place]) 07/23/2019  . Fever   . History of transcatheter aortic valve replacement (TAVR)   . Sacral decubitus ulcer, stage IV ([place]) 07/18/2019  . Sepsis ([place]) 07/18/2019  . Complete heart block ([place])   . Chronic diastolic congestive heart failure ([place]) 02/07/2019  . AKI (acute kidney injury) ([place]) 07/22/2017  . Hypoglycemia 07/21/2017  . Peripheral arterial disease ([place]) 06/18/2017  . B12 deficiency 05/01/2017  . Bilateral lower extremity edema 11/08/2015  . Aortic atherosclerosis ([place]) 06/07/2014  . Abnormality of gait 05/29/2012  . Constipation 03/16/2012  . HOCM (hypertrophic obstructive cardiomyopathy) ([place]) 06/11/2011  . Goals of care, counseling/discussion 06/10/2010  .  Hyperlipidemia   . Refusal of blood transfusions as patient is Jehovah's Witness   . History of adenocarcinoma of breast   . Osteoporosis   . Status post CVA   . Aortic stenosis s/p Tissure AVR 2006   . CAD (coronary artery disease)   . Hypertension   . Depression 01/23/2006  . DM2 (diabetes mellitus, type 2) ([place])  03/25/1990  . Benign hypertensive heart and kidney disease with diastolic CHF, NYHA class 3 and CKD stage 3 ([place]) 1992  . Bradycardia 1992    Conditions to be addressed/monitored: DM, HF, HTN, CKD, HLD, CAD, PVD, skin impairment  Care Plan : Frailty Syndrome (Adult)  Updates made by Barrington Ellison, RN since 07/16/2020 12:00 AM    Problem: Functional Decline (Frailty)     Long-Range Goal: Functional Decline Minimized   Start Date: 07/02/2020  Recent Progress: On track  Priority: High  Note:   Current Barriers:  . Chronic Disease Management support, education, and care coordination needs related to DM, HF, HTN, CKD, HLD, CAD, PVD, skin impairment, severe deconditioning and failure to thrive- . Unable to self administer medications as prescribed . Unable to perform ADLs independently . Unable to perform IADLs independently Case Manager Clinical Goal(s):  . patient will work with BSW to address needs related to Inability to perform ADL's independently and Inability to perform IADL's independently in patient with DM, HF, HTN, CKD, HLD, CAD, PVD, skin impairment, severe deconditioning and failure to thrive Interventions:  . Collaborated with BSW to initiate plan of care to address needs related to Inability to perform ADL's independently and Inability to perform IADL's independently in patient with DM, HF, HTN, CKD, HLD, CAD, PVD, skin impairment, severe deconditioning and failure to thrive . Consulted with Dr Jimmye Norman, [facility] Director re; patient's need for home health services until patient's care is transitioned to [facility] (in about 3 weeks) . Collaboration with Dr. Dorian Pod regarding development and update of comprehensive plan of care as evidenced by provider attestation and co-signature . [place] at [phone] to verify fax then orders for home health services of RN, PT/OT and home health aide successfully faxed to [facility] at [phone] and  requested agency contact this CCM RN for start of services date . Notified daughter [name] of home health orders . Left message with [facility] re: home health  orders . Received message from Santiago Glad at [place] that they do not have a home health aide and asking for return call from this CCM RN to advise whether to proceed with referral to [facility] or send referral to another agency that can provide all requested home health services  . Community message sent to [place] re: home health orders; received reply at 4:54 pm from [facility] that they will accept referral and reach out to patient's family today and that home health services should begin in next 48 hours  . In response to message received via In Basket on 5/11 that [place] does not have bathe aide, called Sharmon Revere at [facility] at [phone] to see if they can provide services. Malachy Mood called patient's daughter. [name], and [name] told Malachy Mood someone from [facility] is coming out today. Malachy Mood said she requested [name] call her back if [facility] is unable to provider all home health services requested including a bath aide. Malachy Mood said [facility] will take the referral and use their OT techs to help with bathing patient  until she goes under the services of [facility] and [facility] if the daughter requests that [facility] provides the home health services.   . 07/13/20- Verified with Candi Leash referral coordinator, contact number [phone],  with [facility] that home health services started with agency on 07/06/20, advised Hoyle Sauer to have [facility] staff contact this CCM RN for any additional needs related to home  health orders, etc . 07/13/20- Returned call to Tessie Eke  at [facility] after she left message for this CCM RN on 07/11/20 requesting clarification of home health services and agency providing services prior to patient's call transitioning to [facility]; provided her with  update that [facility] started services on 07/06/20 . 07/16/20- Voice message left for this CCM RN by Steffanie Dunn [facility] with [facility] at [phone], requesting return call re: need for verbal orders. Returned call to [place] and left message requesting she leave specific order requests on this CCM RN's secure business number so that providers can be notified of needed orders. Bertram Savin care team collaboration (see longitudinal plan of care) Patient Goals/Self-Care Activities .    - Patient will work with BSW to address care coordination needs and will continue to work with the clinical team to address health care and disease management related needs.   Follow Up Plan: The care management team will reach out to the patient again over the next 30 days.      Kelli Churn RN, CCM, [facility] RN Care Manager [phone]

## 2020-07-17 ENCOUNTER — Ambulatory Visit (INDEPENDENT_AMBULATORY_CARE_PROVIDER_SITE_OTHER): Payer: Medicare Other | Admitting: *Deleted

## 2020-07-17 DIAGNOSIS — I2510 Atherosclerotic heart disease of native coronary artery without angina pectoris: Secondary | ICD-10-CM

## 2020-07-17 DIAGNOSIS — E43 Unspecified severe protein-calorie malnutrition: Secondary | ICD-10-CM

## 2020-07-17 DIAGNOSIS — Z7409 Other reduced mobility: Secondary | ICD-10-CM

## 2020-07-17 DIAGNOSIS — E1169 Type 2 diabetes mellitus with other specified complication: Secondary | ICD-10-CM

## 2020-07-17 DIAGNOSIS — L89159 Pressure ulcer of sacral region, unspecified stage: Secondary | ICD-10-CM | POA: Diagnosis present

## 2020-07-17 DIAGNOSIS — I739 Peripheral vascular disease, unspecified: Secondary | ICD-10-CM

## 2020-07-17 NOTE — Chronic Care Management (AMB) (Addendum)
  Care Management   Note  07/17/2020 Name: [name] MRN: [id number] DOB: 29-Aug-1945  Thurnell Lose is enrolled in a Managed Medicaid plan: No.   Received call from [facility] RN, [phone]. She is requesting the following orders for this patient:  1) Dakins soaked Kerlix to stage 4 sacral wound and change dressing every other day.  2) Collagen dressing with border foam to stage 2 right heel wound and change every other day 3) Remove foley and attempt voiding trial, replace foley if patient unable to void.   Plan:  Will message provider to secure orders.  Once orders have been written will fax to [facility] at [phone].  Kelli Churn RN, CCM, [facility] RN Care Manager [phone]  Addendum: 07/18/20 8:00 am Spoke with Steffanie Dunn at [facility] for order clarification per Dr Heide Scales' request. . Dakins solution should be 1/2 strength and kerlix should be 4 inch. Marland Kitchen

## 2020-07-17 NOTE — Addendum Note (Signed)
Addended by: Riesa Pope on: 07/17/2020 04:59 PM   Modules accepted: Orders

## 2020-07-18 MED ORDER — DAKINS (1/2 STRENGTH) 0.25 % EX SOLN: 1.0000 "application " | Freq: Once | CUTANEOUS | 2 refills | Status: AC

## 2020-07-18 MED ORDER — "KERLIX BANDAGE ROLL 2-1/4""X9' MISC": 2 refills | Status: DC

## 2020-07-18 NOTE — Progress Notes (Signed)
Home health RN [facility] called Care management team and discussed with Ms. Broadus John the need for  1) Dakins soaked Kerlix to stage 4 sacral wound and change dressing every other day.  2) Collagen dressing with border foam to stage 2 right heel wound and change every other day 3) Remove foley and attempt voiding trial, replace foley if patient unable to void.  Orders placed for these supplies.

## 2020-08-01 ENCOUNTER — Ambulatory Visit: Payer: Medicare (Managed Care) | Admitting: Licensed Clinical Social Worker

## 2020-08-01 NOTE — Chronic Care Management (AMB) (Signed)
  Care Management   Social Work Visit Note  08/01/2020 Name: [name] MRN: [id number] DOB: December 25, 1945  [name] is a 75 y.o. year old female who sees Jeralyn Bennett, MD for primary care. The care management team was consulted for assistance with care management and care coordination needs related to [facility] services   Patient was given the following information about care management and care coordination services today, agreed to services, and gave verbal consent: 1.care management/care coordination services include personalized support from designated clinical staff supervised by their physician, including individualized plan of care and coordination with other care providers 2. 24/7 contact phone numbers for assistance for urgent and routine care needs. 3. The patient may stop care management/care coordination services at any time by phone call to the office staff.  Engaged with patient by telephone for follow up visit in response to provider referral for social work chronic care management and care coordination services.  Assessment: Review of patient history, allergies, and health status during evaluation of patient need for care management/care coordination services.    Interventions:  . Patient interviewed and appropriate assessments performed . Collaborated with clinical team regarding patient needs  . Patient is now receiving services through [facility].  . SW will no longer be following patient. . SW Collaborated with RN- Ralph Dowdy to inform of [facility] services.   SDOH (Social Determinants of Health) assessments performed: No     Plan:  . No further follow up required: Patient is now receiving services through [facility].  Milus Height, [place]  Social Worker [facility] Care Management  [phone]

## 2020-08-01 NOTE — Patient Instructions (Signed)
Visit Information  Instructions:  Patient was given the following information about care management and care coordination services today, agreed to services, and gave verbal consent: 1.care management/care coordination services include personalized support from designated clinical staff supervised by their physician, including individualized plan of care and coordination with other care providers 2. 24/7 contact phone numbers for assistance for urgent and routine care needs. 3. The patient may stop care management/care coordination services at any time by phone call to the office staff.  Patient verbalizes understanding of instructions provided today and agrees to view in [place].   No further follow up required: Patient has started services through [facility].   Milus Height, [place]  Social Worker [facility] Care Management  [phone]

## 2020-08-03 ENCOUNTER — Telehealth: Payer: Medicare (Managed Care)

## 2020-08-03 ENCOUNTER — Telehealth: Payer: Self-pay

## 2020-08-03 NOTE — Addendum Note (Signed)
Addended by: Jodean Lima on: 08/03/2020 12:51 PM   Modules accepted: Orders

## 2020-08-03 NOTE — Telephone Encounter (Signed)
  Care Management   Outreach Note  08/03/2020 Name: [name] MRN: [id number] DOB: April 26, 1945  Referred by: Jeralyn Bennett, MD Reason for referral : Chronic Care Management   An unsuccessful telephone outreach was attempted today. The patient was referred to the case management team for assistance with care management and care coordination.   Follow Up Plan: Telephone follow up appointment with care management team member scheduled for: 1-2 weeks  Johnney Killian, RN, BSN, CCM Care Management Coordinator [facility] Phone: [phone] / Fax: [phone]

## 2020-08-10 ENCOUNTER — Ambulatory Visit: Payer: Medicare (Managed Care)

## 2020-08-10 NOTE — Chronic Care Management (AMB) (Signed)
   08/10/2020  [name] 06/19/1945 [id number]   Patient is active with [facility] and will no longer be followed by [facility].  Johnney Killian, RN, BSN, CCM Care Management Coordinator [facility] Internal Medicine Phone: [phone] / Fax: [phone]

## 2020-08-20 ENCOUNTER — Telehealth: Payer: Medicare Other

## 2020-08-21 ENCOUNTER — Telehealth: Payer: Self-pay

## 2020-08-21 NOTE — Telephone Encounter (Signed)
I connected by phone with [name] and/or patient's caregiver on 08/21/2020 at 9:04 AM to discuss the potential vaccination through our Homebound vaccination initiative.   Prevaccination Checklist for COVID-19 Vaccines  1.  Are you feeling sick today? no  2.  Have you ever received a dose of a COVID-19 vaccine?  yes      If yes, which one? Moderna   How many dose of Covid-19 vaccine have your received and dates ? 3, 03/09/2019, 04/06/2019, 02/28/2020.  Check all that apply: I live in a long-term care setting. no I have been diagnosed with a medical condition(s). Please list: Bedridden, homebound, multiple medical conditions. (pertinent to homebound status) I am a first responder. 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 no  4. Do you have a health condition or are you undergoing treatment that makes you moderately or severely immunocompromised? (This would include treatment for cancer or HIV, receipt of organ transplant, immunosuppressive therapy or high-dose corticosteroids, CAR-T-cell therapy, hematopoietic cell transplant [HCT], DiGeorge syndrome or Wiskott-Aldrich syndrome)  no  5. Have you received hematopoietic cell transplant (HCT) or CAR-T-cell therapies since receiving COVID-19 vaccine? no  6.  Have you ever had an allergic reaction: (This would include a severe reaction [ e.g., anaphylaxis] that required treatment with epinephrine or EpiPen or that caused you to go to the hospital.  It would also include an allergic reaction that occurred within 4 hours that caused hives, swelling, or respiratory distress, including wheezing.) A.  A previous dose of COVID-19 vaccine. no  B.  A vaccine or injectable therapy that contains multiple components, one of which is a COVID-19 vaccine component, but it is not known which component elicited the immediate reaction. no  C.   Are you allergic to polyethylene glycol? no  D. Are you allergic to Polysorbate, which is found in some vaccines, film coated tablets and intravenous steroids?  no   7.  Have you ever had an allergic reaction to another vaccine (other than COVID-19 vaccine) or an injectable medication? (This would include a severe reaction [ e.g., anaphylaxis] that required treatment with epinephrine or EpiPen or that caused you to go to the hospital.  It would also include an allergic reaction that occurred within 4 hours that caused hives, swelling, or respiratory distress, including wheezing.)  no   8.  Have you ever had a severe allergic reaction (e.g., anaphylaxis) to something other than a component of the COVID-19 vaccine, or any vaccine or injectable medication?  This would include food, pet, venom, environmental, or oral medication allergies.  no   Check all that apply to you:  Am a female between ages 58 and 45 years old  no  Women 90 through 77 years of age can receive any FDA-authorized or -approved COVID-19 vaccine. However, they should be informed of the rare but increased risk of thrombosis with thrombocytopenia syndrome (TTS) after receipt of the [employer] Vaccine and the availability of other FDA-authorized and -approved COVID-19 vaccines. People who had TTS after a first dose of Janssen vaccine should not receive a subsequent dose of Janssen product    Am a female between ages 59 and 51 years old  no Males 5 through 77 years of age may receive the correct formulation of Pfizer-BioNTech COVID-19 vaccine. Males 18 and older can receive any FDA-authorized or -approved vaccine. However, people receiving an mRNA COVID-19 vaccine, especially  males 13 through 77 years of age and their parents/legal representative (when relevant), should be informed of the risk of developing myocarditis (an inflammation of the heart muscle) or pericarditis (inflammation of the lining around the heart) after receipt of an  mRNA vaccine. The risk of developing either myocarditis or pericarditis after vaccination is low, and lower than the risk of myocarditis associated with SARS-CoV-2 infection in adolescents and adults. Vaccine recipients should be counseled about the need to seek care if symptoms of myocarditis or pericarditis develop after vaccination     Have a history of myocarditis or pericarditis  no Myocarditis or pericarditis after receipt of the first dose of an mRNA COVID-19 vaccine series but before administration of the second dose  Experts advise that people who develop myocarditis or pericarditis after a dose of an mRNA COVID-19 vaccine not receive a subsequent dose of any COVID-19 vaccine, until additional safety data are available.  Administration of a subsequent dose of COVID-19 vaccine before safety data are available can be considered in certain circumstances after the episode of myocarditis or pericarditis has completely resolved. Until additional data are available, some experts recommend a Alphonsa Overall COVID-19 vaccine be considered instead of an mRNA COVID-19 vaccine. Decisions about proceeding with a subsequent dose should include a conversation between the patient, their parent/legal representative (when relevant), and their clinical team, which may include a cardiologist.    Have been treated with monoclonal antibodies or convalescent serum to prevent or treat COVID-19  no Vaccination should be offered to people regardless of history of prior symptomatic or asymptomatic SARS-CoV-2 infection. There is no recommended minimal interval between infection and vaccination.  However, vaccination should be deferred if a patient received monoclonal antibodies or convalescent serum as treatment for COVID-19 or for post-exposure prophylaxis. This is a precautionary measure until additional information becomes available, to avoid interference of the antibody treatment with vaccine-induced immune responses.  Defer  COVID-19 vaccination for 30 days when a passive antibody product was used for post-exposure prophylaxis.  Defer COVID-19 vaccination for 90 days when a passive antibody product was used to treat COVID-19.     Diagnosed with Multisystem Inflammatory Syndrome (MIS-C or MIS-A) after a COVID-19 infection  no It is unknown if people with a history of MIS-C or MIS-A are at risk for a dysregulated immune response to COVID-19 vaccination.  People with a history of MIS-C or MIS-A may choose to be vaccinated. Considerations for vaccination may include:  Clinical recovery from MIS-C or MIS-A, including return to normal cardiac function  Personal risk of severe acute COVID-19 (e.g., age, underlying conditions)  High or substantial community transmission of SARS-CoV-2 and personal increased risk of reinfection.  Timing of any immunomodulatory therapies (general best practice guidelines for immunization can be consulted for more information [email or web address])  It has been 90 days or more since their diagnosis of MIS-C  Onset of MIS-C occurred before any COVID-19 vaccination   A conversation between the patient, their guardian(s), and their clinical team or a specialist may assist with COVID-19 vaccination decisions. Healthcare providers and health departments may also request a consultation from the [employer] at [email or web address].     Have a bleeding disorder  no Take a blood thinner  yes As with all vaccines, any COVID-19 vaccine product may be given to these patients, if a physician familiar with the patient's bleeding risk determines that the vaccine can be administered intramuscularly with reasonable safety.  ACIP recommends the following technique for intramuscular  vaccination in patients with bleeding disorders or taking blood thinners: a fine-gauge needle (23-gauge or  smaller caliber) should be used for the vaccination, followed by firm pressure on the site, without rubbing, for at least 2 minutes.  People who regularly take aspirin or anticoagulants as part of their routine medications do not need to stop these medications prior to receipt of any COVID-19 vaccine.    Have a history of heparin-induced thrombocytopenia (HIT)  no Although the etiology of TTS associated with the Alphonsa Overall COVID-19 vaccine is unclear, it appears to be similar to another rare immune-mediated syndrome, heparin-induced thrombocytopenia (HIT). People with a history of an episode of an immune-mediated syndrome characterized by thrombosis and thrombocytopenia, such as HIT, should be offered a currently FDA-approved or FDA-authorized mRNA COVID-19 vaccine if it has been ?90 days since their TTS resolved. After 90 days, patients may be vaccinated with any currently FDA-approved or FDA-authorized COVID-19 vaccine, including Janssen COVID-19 Vaccine. However, people who developed TTS after their initial Alphonsa Overall vaccine should not receive a Janssen booster dose.  Experts believe the following factors do not make people more susceptible to TTS after receipt of the [employer]. People with these conditions can be vaccinated with any FDA-authorized or - approved COVID-19 vaccine, including the [employer] COVID-19 Vaccine:  A prior history of venous thromboembolism  Risk factors for venous thromboembolism (e.g., inherited or acquired thrombophilia including Factor V Leiden; prothrombin gene 20210A mutation; antiphospholipid syndrome; protein C, protein S or antithrombin deficiency  A prior history of other types of thromboses not associated with thrombocytopenia  Pregnancy, post-partum status, or receipt of hormonal contraceptives (e.g., combined oral contraceptives, patch, ring)   Additional recipient education materials can be found at [email or web address]  vaccines/safety/JJUpdate.html.    Am currently pregnant or breastfeeding  no Vaccination is recommended for all people aged 32 years and older, including people that are:  Pregnant  Breastfeeding  Trying to get pregnant now or who might become pregnant in the future   Pregnant, breastfeeding, and post-partum people 9 through 77 years of age should be aware of the rare risk of TTS after receipt of the Alphonsa Overall COVID-19 Vaccine and the availability of other FDA-authorized or -approved COVID-19 vaccines (i.e., mRNA vaccines).    Have received dermal fillers  no FDA-authorized or -approved COVID-19 vaccines can be administered to people who have received injectable dermal fillers who have no contraindications for vaccination.  Infrequently, these people might experience temporary swelling at or near the site of filler injection (usually the face or lips) following administration of a dose of an mRNA COVID-19 vaccine. These people should be advised to contact their healthcare provider if swelling develops at or near the site of dermal filler following vaccination.     Have a history of Guillain-Barr Syndrome (GBS)  no People with a history of GBS can receive any FDA-authorized or -approved COVID-19 vaccine. However, given the possible association between the [employer] and an increased risk of GBS, a patient with a history of GBS and their clinical team should discuss the availability of mRNA vaccines to offer protection against COVID-19. The highest risk has been observed in men aged 57-64 years with symptoms of GBS beginning within 42 days after Alphonsa Overall COVID-19 vaccination.  People who had GBS after receiving Janssen vaccine should be made aware of the option to receive an mRNA COVID-19 vaccine booster at least 2 months (8 weeks) after the Janssen dose. However, Alphonsa Overall vaccine may be used as a booster, particularly if  GBS occurred more than 42 days after vaccination or was related to a  non-vaccine factor. Prior to booster vaccination, a conversation between the patient and their clinical team may assist with decisions about use of a COVID-19 booster dose, including the timing of administration     Postvaccination Observation Times for People without Contraindications to Covid 19 Vaccination.  30 minutes: People with a history of: A contraindication to another type of COVID-19 vaccine product (i.e., mRNA or viral vector COVID-19 vaccines)  Immediate (within 4 hours of exposure) non-severe allergic reaction to a COVID-19 vaccine or injectable therapies  Anaphylaxis due to any cause  Immediate allergic reaction of any severity to a non-COVID-19 vaccine   15 minutes: All other people  This patient is a 75 y.o. female that meets the FDA criteria to receive homebound vaccination. Patient or parent/caregiver understands they have the option to accept or refuse homebound vaccination. Patient passed the pre-screening checklist and would like to proceed with homebound vaccination. Based on questionnaire above, I recommend the patient be observed for 15 minutes. There are an estimated #0 other household members/caregivers who are also interested in receiving the vaccine.    The patient has been confirmed homebound and eligible for homebound vaccination with the considerations outlined above. I will send the patient's information to our scheduling team who will reach out to schedule the patient and potential caregiver/family members for homebound vaccination.    [name] 08/21/2020 9:04 AM

## 2020-08-22 ENCOUNTER — Ambulatory Visit: Payer: Medicare (Managed Care) | Attending: Critical Care Medicine

## 2020-08-22 ENCOUNTER — Ambulatory Visit: Payer: Medicare (Managed Care)

## 2020-08-22 DIAGNOSIS — Z23 Encounter for immunization: Secondary | ICD-10-CM

## 2020-08-22 NOTE — Progress Notes (Signed)
   Covid-19 Vaccination Clinic  Name:  DEVLIN MCVEIGH    MRN: [id number] DOB: 05/16/1945  08/22/2020  Ms. Whelpley was observed post Covid-19 immunization for 15 minutes without incident. She was provided with Vaccine Information Sheet and instruction to access the V-Safe system.   Ms. Riedesel was instructed to call 911 with any severe reactions post vaccine: Difficulty breathing  Swelling of face and throat  A fast heartbeat  A bad rash all over body  Dizziness and weakness   Immunizations Administered     Name Date Dose VIS Date Route   Moderna Covid-19 Booster Vaccine 08/22/2020  9:51 AM 0.25 mL 12/14/2019 Intramuscular   Manufacturer: Moderna   Lot: [id number]   [place]: 59977-414-23

## 2020-08-28 NOTE — Progress Notes (Signed)
Ms. Mowrer was enrolled at [facility]!

## 2020-10-05 ENCOUNTER — Emergency Department (HOSPITAL_COMMUNITY): Payer: Medicare (Managed Care)

## 2020-10-05 ENCOUNTER — Other Ambulatory Visit: Payer: Self-pay

## 2020-10-05 ENCOUNTER — Encounter (HOSPITAL_COMMUNITY): Payer: Self-pay

## 2020-10-05 ENCOUNTER — Inpatient Hospital Stay (HOSPITAL_COMMUNITY)
Admission: EM | Admit: 2020-10-05 | Discharge: 2020-10-25 | DRG: 682 | Disposition: E | Payer: Medicare (Managed Care) | Attending: Internal Medicine | Admitting: Internal Medicine

## 2020-10-05 DIAGNOSIS — Z7401 Bed confinement status: Secondary | ICD-10-CM

## 2020-10-05 DIAGNOSIS — Z833 Family history of diabetes mellitus: Secondary | ICD-10-CM

## 2020-10-05 DIAGNOSIS — Z951 Presence of aortocoronary bypass graft: Secondary | ICD-10-CM

## 2020-10-05 DIAGNOSIS — I82432 Acute embolism and thrombosis of left popliteal vein: Secondary | ICD-10-CM | POA: Diagnosis present

## 2020-10-05 DIAGNOSIS — I130 Hypertensive heart and chronic kidney disease with heart failure and stage 1 through stage 4 chronic kidney disease, or unspecified chronic kidney disease: Secondary | ICD-10-CM | POA: Diagnosis present

## 2020-10-05 DIAGNOSIS — Z853 Personal history of malignant neoplasm of breast: Secondary | ICD-10-CM

## 2020-10-05 DIAGNOSIS — N179 Acute kidney failure, unspecified: Principal | ICD-10-CM | POA: Diagnosis present

## 2020-10-05 DIAGNOSIS — E1151 Type 2 diabetes mellitus with diabetic peripheral angiopathy without gangrene: Secondary | ICD-10-CM | POA: Diagnosis present

## 2020-10-05 DIAGNOSIS — E872 Acidosis, unspecified: Secondary | ICD-10-CM

## 2020-10-05 DIAGNOSIS — Z794 Long term (current) use of insulin: Secondary | ICD-10-CM

## 2020-10-05 DIAGNOSIS — J9601 Acute respiratory failure with hypoxia: Secondary | ICD-10-CM | POA: Diagnosis present

## 2020-10-05 DIAGNOSIS — Z8249 Family history of ischemic heart disease and other diseases of the circulatory system: Secondary | ICD-10-CM

## 2020-10-05 DIAGNOSIS — E1122 Type 2 diabetes mellitus with diabetic chronic kidney disease: Secondary | ICD-10-CM | POA: Diagnosis present

## 2020-10-05 DIAGNOSIS — I82442 Acute embolism and thrombosis of left tibial vein: Secondary | ICD-10-CM | POA: Diagnosis present

## 2020-10-05 DIAGNOSIS — Z79899 Other long term (current) drug therapy: Secondary | ICD-10-CM

## 2020-10-05 DIAGNOSIS — R609 Edema, unspecified: Secondary | ICD-10-CM | POA: Diagnosis not present

## 2020-10-05 DIAGNOSIS — R4182 Altered mental status, unspecified: Secondary | ICD-10-CM | POA: Diagnosis present

## 2020-10-05 DIAGNOSIS — N184 Chronic kidney disease, stage 4 (severe): Secondary | ICD-10-CM | POA: Diagnosis present

## 2020-10-05 DIAGNOSIS — I959 Hypotension, unspecified: Secondary | ICD-10-CM | POA: Diagnosis present

## 2020-10-05 DIAGNOSIS — I82412 Acute embolism and thrombosis of left femoral vein: Secondary | ICD-10-CM | POA: Diagnosis present

## 2020-10-05 DIAGNOSIS — Z8673 Personal history of transient ischemic attack (TIA), and cerebral infarction without residual deficits: Secondary | ICD-10-CM

## 2020-10-05 DIAGNOSIS — Z515 Encounter for palliative care: Secondary | ICD-10-CM

## 2020-10-05 DIAGNOSIS — Z950 Presence of cardiac pacemaker: Secondary | ICD-10-CM

## 2020-10-05 DIAGNOSIS — Z7902 Long term (current) use of antithrombotics/antiplatelets: Secondary | ICD-10-CM

## 2020-10-05 DIAGNOSIS — Z953 Presence of xenogenic heart valve: Secondary | ICD-10-CM

## 2020-10-05 DIAGNOSIS — E538 Deficiency of other specified B group vitamins: Secondary | ICD-10-CM | POA: Diagnosis present

## 2020-10-05 DIAGNOSIS — I2510 Atherosclerotic heart disease of native coronary artery without angina pectoris: Secondary | ICD-10-CM | POA: Diagnosis present

## 2020-10-05 DIAGNOSIS — R400 Somnolence: Secondary | ICD-10-CM

## 2020-10-05 DIAGNOSIS — I5032 Chronic diastolic (congestive) heart failure: Secondary | ICD-10-CM | POA: Diagnosis present

## 2020-10-05 DIAGNOSIS — Z90710 Acquired absence of both cervix and uterus: Secondary | ICD-10-CM

## 2020-10-05 DIAGNOSIS — Z8616 Personal history of COVID-19: Secondary | ICD-10-CM | POA: Diagnosis not present

## 2020-10-05 DIAGNOSIS — M7989 Other specified soft tissue disorders: Secondary | ICD-10-CM

## 2020-10-05 DIAGNOSIS — E785 Hyperlipidemia, unspecified: Secondary | ICD-10-CM | POA: Diagnosis present

## 2020-10-05 DIAGNOSIS — D539 Nutritional anemia, unspecified: Secondary | ICD-10-CM | POA: Diagnosis present

## 2020-10-05 DIAGNOSIS — M810 Age-related osteoporosis without current pathological fracture: Secondary | ICD-10-CM | POA: Diagnosis present

## 2020-10-05 DIAGNOSIS — Z66 Do not resuscitate: Secondary | ICD-10-CM | POA: Diagnosis present

## 2020-10-05 DIAGNOSIS — Z7982 Long term (current) use of aspirin: Secondary | ICD-10-CM

## 2020-10-05 LAB — CBC WITH DIFFERENTIAL/PLATELET
Abs Immature Granulocytes: 0.07 10*3/uL (ref 0.00–0.07)
Basophils Absolute: 0 10*3/uL (ref 0.0–0.1)
Basophils Relative: 0 %
Eosinophils Absolute: 0 10*3/uL (ref 0.0–0.5)
Eosinophils Relative: 0 %
HCT: 24.3 % — ABNORMAL LOW (ref 36.0–46.0)
Hemoglobin: 7.9 g/dL — ABNORMAL LOW (ref 12.0–15.0)
Immature Granulocytes: 1 %
Lymphocytes Relative: 4 %
Lymphs Abs: 0.3 10*3/uL — ABNORMAL LOW (ref 0.7–4.0)
MCH: 34.6 pg — ABNORMAL HIGH (ref 26.0–34.0)
MCHC: 32.5 g/dL (ref 30.0–36.0)
MCV: 106.6 fL — ABNORMAL HIGH (ref 80.0–100.0)
Monocytes Absolute: 0.4 10*3/uL (ref 0.1–1.0)
Monocytes Relative: 4 %
Neutro Abs: 8.2 10*3/uL — ABNORMAL HIGH (ref 1.7–7.7)
Neutrophils Relative %: 91 %
Platelets: 126 10*3/uL — ABNORMAL LOW (ref 150–400)
RBC: 2.28 MIL/uL — ABNORMAL LOW (ref 3.87–5.11)
RDW: 17.2 % — ABNORMAL HIGH (ref 11.5–15.5)
WBC: 9 10*3/uL (ref 4.0–10.5)
nRBC: 0.3 % — ABNORMAL HIGH (ref 0.0–0.2)

## 2020-10-05 LAB — COMPREHENSIVE METABOLIC PANEL
ALT: 45 U/L — ABNORMAL HIGH (ref 0–44)
AST: 35 U/L (ref 15–41)
Albumin: <1 g/dL — ABNORMAL LOW (ref 3.5–5.0)
Alkaline Phosphatase: 138 U/L — ABNORMAL HIGH (ref 38–126)
Anion gap: 11 (ref 5–15)
BUN: 19 mg/dL (ref 8–23)
CO2: 21 mmol/L — ABNORMAL LOW (ref 22–32)
Calcium: 7.8 mg/dL — ABNORMAL LOW (ref 8.9–10.3)
Chloride: 110 mmol/L (ref 98–111)
Creatinine, Ser: 1.86 mg/dL — ABNORMAL HIGH (ref 0.44–1.00)
GFR, Estimated: 28 mL/min — ABNORMAL LOW (ref >60)
Glucose, Bld: 161 mg/dL — ABNORMAL HIGH (ref 70–99)
Potassium: 4.9 mmol/L (ref 3.5–5.1)
Sodium: 142 mmol/L (ref 135–145)
Total Bilirubin: 1.1 mg/dL (ref 0.3–1.2)
Total Protein: 4.9 g/dL — ABNORMAL LOW (ref 6.5–8.1)

## 2020-10-05 LAB — LACTIC ACID, PLASMA
Lactic Acid, Venous: 6.8 mmol/L (ref 0.5–1.9)
Lactic Acid, Venous: 6.1 mmol/L (ref 0.5–1.9)

## 2020-10-05 LAB — LIPASE, BLOOD: Lipase: 15 U/L (ref 11–51)

## 2020-10-05 LAB — RESP PANEL BY RT-PCR (FLU A&B, COVID) ARPGX2
Influenza A by PCR: NEGATIVE
Influenza B by PCR: NEGATIVE
SARS Coronavirus 2 by RT PCR: NEGATIVE

## 2020-10-05 MED ORDER — ENOXAPARIN SODIUM 60 MG/0.6ML IJ SOSY
60.0000 mg | PREFILLED_SYRINGE | INTRAMUSCULAR | Status: DC
  Administered 2020-10-05: 60 mg via SUBCUTANEOUS
  Filled 2020-10-05: qty 0.6

## 2020-10-05 MED ORDER — VANCOMYCIN HCL 1500 MG/300ML IV SOLN
1500.0000 mg | Freq: Once | INTRAVENOUS | Status: AC
  Administered 2020-10-05: 1500 mg via INTRAVENOUS
  Filled 2020-10-05: qty 300

## 2020-10-05 MED ORDER — SODIUM CHLORIDE 0.9 % IV SOLN: Freq: Once | INTRAVENOUS | Status: DC

## 2020-10-05 MED ORDER — SODIUM CHLORIDE 0.9% FLUSH
3.0000 mL | Freq: Two times a day (BID) | INTRAVENOUS | Status: DC
  Administered 2020-10-05 – 2020-10-06 (×2): 3 mL via INTRAVENOUS

## 2020-10-05 MED ORDER — SODIUM CHLORIDE 0.9 % IV SOLN
2.0000 g | Freq: Once | INTRAVENOUS | Status: AC
  Administered 2020-10-05: 2 g via INTRAVENOUS
  Filled 2020-10-05: qty 2

## 2020-10-05 MED ORDER — SODIUM CHLORIDE 0.9 % IV SOLN: 2.0000 g | INTRAVENOUS | Status: DC

## 2020-10-05 MED ORDER — SODIUM CHLORIDE 0.9 % IV SOLN: INTRAVENOUS | Status: AC

## 2020-10-05 MED ORDER — METRONIDAZOLE 500 MG/100ML IV SOLN
500.0000 mg | Freq: Once | INTRAVENOUS | Status: AC
  Administered 2020-10-05: 500 mg via INTRAVENOUS
  Filled 2020-10-05: qty 100

## 2020-10-05 MED ORDER — ENOXAPARIN SODIUM 30 MG/0.3ML IJ SOSY: 30.0000 mg | PREFILLED_SYRINGE | INTRAMUSCULAR | Status: DC

## 2020-10-05 MED ORDER — VANCOMYCIN VARIABLE DOSE PER UNSTABLE RENAL FUNCTION (PHARMACIST DOSING): Status: DC

## 2020-10-05 MED ORDER — SODIUM CHLORIDE 0.9 % IV BOLUS (SEPSIS)
1000.0000 mL | Freq: Once | INTRAVENOUS | Status: AC
  Administered 2020-10-05: 1000 mL via INTRAVENOUS

## 2020-10-05 MED ORDER — TIZANIDINE HCL 4 MG PO TABS: 4.0000 mg | ORAL_TABLET | Freq: Every evening | ORAL | Status: DC | PRN

## 2020-10-05 MED ORDER — VANCOMYCIN HCL 1750 MG/350ML IV SOLN
1750.0000 mg | Freq: Once | INTRAVENOUS | Status: DC
  Filled 2020-10-05: qty 350

## 2020-10-05 MED ORDER — SODIUM CHLORIDE 0.9 % IV BOLUS
500.0000 mL | Freq: Once | INTRAVENOUS | Status: AC
  Administered 2020-10-05: 500 mL via INTRAVENOUS

## 2020-10-05 NOTE — ED Triage Notes (Signed)
Pt from home BIB EMS for AMS. Pt's family reports AMS started yesterday along with some left hand swelling that appeared overnight. On arrival, pt nonverbal and doesn't follow commands.

## 2020-10-05 NOTE — ED Provider Notes (Signed)
Emergency Department Provider Note   I have reviewed the triage vital signs and the nursing notes.   HISTORY  Chief Complaint Altered Mental Status   HPI [name] is a 75 y.o. female with complicated past medical history reviewed below including CHF, chronic sacral decubitus wound, CKD, AS s/p TAVR presents to the ED with AMS and left hand/arm swelling.  Patient is confused and not currently speaking. Level 5 caveat applies with AMS.   I spoke with the patient's daughter, Jonna Munro by phone.  She tells me that the patient is typically confused but talkative.  She stopped speaking yesterday and seemed more confused and somnolent.  They noted that she developed some swelling in the left hand.  She has not noticed fever, medication changes, other symptoms.  She is currently receiving [facility] through [facility]. She is NOT on hospice and daughter tells me that when the patient was awake after her last admit she indicated to family that she would want to be a FULL CODE. 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    Past Medical History:  Diagnosis Date   Acute on chronic diastolic CHF (congestive heart failure) (HCC) 06/22/2017   Acute on chronic renal failure (HCC) 05/25/2011   Adenocarcinoma of breast (HCC) 1996   Completed tamoxifen and had mastectomy.   Aortic stenosis, severe    s/p aortic valve replacement with porcine valve 06/2004.  ECHO 2010 EF 65%, LVH, diastolic dysfxn, Bioprostetic aoritc valve, mild AS. ECHO 2013 EF 60%, Nl aortic artificial valve, dynamic obstruction in the outflow tract   Class IIb rec for annual TTE after 5 yrs. She had a TTE 2013     Arthritis    CAD (coronary artery disease) 2006   s/p CABG (5/06) w/ saphenous vein to RCA at time of AVR   CKD (chronic kidney disease) stage 2, GFR 60-89 ml/min 06/03/2011   She is no longer taking NSAIDs.    CVA (cerebral infarction) 2006   Post-op from AVR.  Presumed embolic in nature. Carotid stenosis of R 60-79%. Repeat dopplers 4/10 no R stenosis and L stenosis of 1-29%.   Depression    Controlled on Paxil   Diabetes mellitus 1992   Dx 04/25/1990. Now insulin dependent, started 2008. On ACEI.    Diverticulosis 2001   History of 2019 novel coronavirus disease (COVID-19)    Hyperlipidemia    Mgmt with a statin   Hypertension    Requires 4 drug tx   Osteoporosis 2006   DEXA 10/06 : L femur T -2.8, R -2.7. Lumbar T -2.4. On bisphosphonates and  Calcium / Vit D.   Peripheral vascular disease (HCC)    Presence of permanent cardiac pacemaker    Refusal of blood transfusions as patient is Jehovah's Witness    Stroke [facility])     Patient Active Problem List   Diagnosis Date Noted   Severe anemia    Hypoalbuminemia    Protein-calorie malnutrition, severe 05/11/2020   Anasarca    Catheter-associated urinary tract infection (HCC) 05/09/2020   Toxic metabolic encephalopathy 05/09/2020   Hypothermia 05/09/2020   Decubitus ulcer of right heel, stage 3 (HCC) 05/09/2020   Open wound of heel 04/03/2020   Bleeding 01/27/2020   CKD (chronic kidney disease) stage 3, GFR 30-59 ml/min (HCC) 01/27/2020   Critical lower limb ischemia (HCC) 01/04/2020   Neurocognitive deficits 08/04/2019   CVA (cerebral vascular accident) (HCC) 07/23/2019  Fever    History of transcatheter aortic valve replacement (TAVR)    Sacral decubitus ulcer, stage IV (HCC) 07/18/2019   Sepsis ([place]) 07/18/2019   Complete heart block (HCC)    Chronic diastolic congestive heart failure ([place]) 02/07/2019   AKI (acute kidney injury) ([place]) 07/22/2017   Hypoglycemia 07/21/2017   Peripheral arterial disease ([place]) 06/18/2017   B12 deficiency 05/01/2017   Bilateral lower extremity edema 11/08/2015   Aortic atherosclerosis ([place]) 06/07/2014   Abnormality of gait 05/29/2012   Constipation 03/16/2012   HOCM (hypertrophic obstructive cardiomyopathy) ([place]) 06/11/2011   Goals of care,  counseling/discussion 06/10/2010   Hyperlipidemia    Refusal of blood transfusions as patient is Jehovah's Witness    History of adenocarcinoma of breast    Osteoporosis    Status post CVA    Aortic stenosis s/p Tissure AVR 2006    CAD (coronary artery disease)    Hypertension    Depression 01/23/2006   DM2 (diabetes mellitus, type 2) ([place]) 03/25/1990   Benign hypertensive heart and kidney disease with diastolic CHF, NYHA class 3 and CKD stage 3 ([place]) 1992   Bradycardia 1992    Past Surgical History:  Procedure Laterality Date   ABDOMINAL HYSTERECTOMY  1987   for fibroids   AORTIC VALVE REPLACEMENT  2006   BONE BIOPSY Right 04/06/2020   Procedure: BONE BIOPSY;  Surgeon: Landis Martins, DPM;  Location: [facility];  Service: Podiatry;  Laterality: Right;   CHOLECYSTECTOMY     CORONARY ARTERY BYPASS GRAFT  2006   Saphenous vein to RCA at time of AVR. Course complicated by acute respiratory failure, post-op PTX, ARI, ileus, CVA   INCISION AND DRAINAGE OF WOUND Right 04/06/2020   Procedure: IRRIGATION AND DEBRIDEMENT WOUND;  Surgeon: Landis Martins, DPM;  Location: [facility];  Service: Podiatry;  Laterality: Right;   IR FLUORO GUIDE CV LINE RIGHT  04/11/2020   IR REMOVAL TUN CV CATH W/O FL  05/16/2020   IR US GUIDE VASC ACCESS RIGHT  04/11/2020   LOWER EXTREMITY ANGIOGRAPHY N/A 01/04/2020   Procedure: LOWER EXTREMITY ANGIOGRAPHY - Right;  Surgeon: Cherre Robins, MD;  Location: [place] CV LAB;  Service: Cardiovascular;  Laterality: N/A;   MASTECTOMY Left 1995   L for adenocarcinoma   PACEMAKER IMPLANT N/A 02/09/2019   Procedure: PACEMAKER IMPLANT;  Surgeon: Constance Haw, MD;  Location: [place] CV LAB;  Service: Cardiovascular;  Laterality: N/A;   PERIPHERAL VASCULAR BALLOON ANGIOPLASTY  01/04/2020   Procedure: PERIPHERAL VASCULAR BALLOON ANGIOPLASTY;  Surgeon: Cherre Robins, MD;  Location: [place] CV LAB;  Service: Cardiovascular;;  Rt AT   RIGHT HEART CATH AND  CORONARY/GRAFT ANGIOGRAPHY N/A 02/09/2019   Procedure: RIGHT HEART CATH AND CORONARY/GRAFT ANGIOGRAPHY;  Surgeon: Sherren Mocha, MD;  Location: [place] CV LAB;  Service: Cardiovascular;  Laterality: N/A;   TEE WITHOUT CARDIOVERSION N/A 07/20/2019   Procedure: TRANSESOPHAGEAL ECHOCARDIOGRAM (TEE);  Surgeon: Jerline Pain, MD;  [facility];  Service: Cardiovascular;  Laterality: N/A;   TRANSCATHETER AORTIC VALVE REPLACEMENT, TRANSFEMORAL N/A 03/01/2019   Procedure: TRANSCATHETER AORTIC VALVE REPLACEMENT, TRANSFEMORAL;  Surgeon: Sherren Mocha, MD;  Location: [place] CV LAB;  Service: Open Heart Surgery;  Laterality: N/A;    Allergies Doxycycline and Other  Family History  Problem Relation Age of Onset   Diabetes Mother    Hypertension Mother    Alzheimer's disease Mother    Heart disease Father 10       AMI at age 37  and 81   Mental illness Sister    Heart disease Sister 35       AMI   Kidney disease Sister     Social History Social History   Tobacco Use   Smoking status: Never   Smokeless tobacco: Never  Vaping Use   Vaping Use: Never used  Substance Use Topics   Alcohol use: Yes    Alcohol/week: 0.0 standard drinks    Comment: Occasional beer, monthly   Drug use: No    Review of Systems  Level 5 caveat: AMS - Non-verbal.   ____________________________________________   PHYSICAL EXAM:  VITAL SIGNS: ED Triage Vitals [09/29/2020 0904]  Enc Vitals Group     BP (!) 146/76     Pulse Rate 83     Resp 14     Temp (!) 97.5 F (36.4 C)     Temp Source Oral     SpO2 94 %   Constitutional: Alert and intermittently making eye contact. Not speaking.  Eyes: Conjunctivae are normal. PERRL.  Head: Atraumatic. Nose: No congestion/rhinnorhea. Mouth/Throat: Mucous membranes are slightly dry.  Neck: No stridor.  Cardiovascular: Normal rate, regular rhythm. Good peripheral circulation. Grossly normal heart sounds.   Respiratory: Normal respiratory effort.   No retractions. Lungs CTAB. Gastrointestinal: Soft and nontender. No distention.  Musculoskeletal: No lower extremity tenderness nor edema. No gross deformities of extremities. Left hand edema noted without skin breakdown or cellulitis.  Neurologic:  Normal speech and language. No gross focal neurologic deficits are appreciated.  Skin:  Skin is warm and dry. Stage IV sacral decubitus wound noted. No biofilm, drainage, or surrounding cellulitis on exam.    ____________________________________________   LABS (all labs ordered are listed, but only abnormal results are displayed)  Labs Reviewed  COMPREHENSIVE METABOLIC PANEL - Abnormal; Notable for the following components:      Result Value   CO2 21 (*)    Glucose, Bld 161 (*)    Creatinine, Ser 1.86 (*)    Calcium 7.8 (*)    Total Protein 4.9 (*)    Albumin <1.0 (*)    ALT 45 (*)    Alkaline Phosphatase 138 (*)    GFR, Estimated 28 (*)    All other components within normal limits  LACTIC ACID, PLASMA - Abnormal; Notable for the following components:   Lactic Acid, Venous 6.8 (*)    All other components within normal limits  CBC WITH DIFFERENTIAL/PLATELET - Abnormal; Notable for the following components:   RBC 2.28 (*)    Hemoglobin 7.9 (*)    HCT 24.3 (*)    MCV 106.6 (*)    MCH 34.6 (*)    RDW 17.2 (*)    Platelets 126 (*)    nRBC 0.3 (*)    Neutro Abs 8.2 (*)    Lymphs Abs 0.3 (*)    All other components within normal limits  CULTURE, BLOOD (ROUTINE X 2)  CULTURE, BLOOD (ROUTINE X 2)  RESP PANEL BY RT-PCR (FLU A&B, COVID) [id number]  URINE CULTURE  LIPASE, BLOOD  LACTIC ACID, PLASMA  URINALYSIS, ROUTINE W REFLEX MICROSCOPIC   ____________________________________________  EKG   EKG Interpretation  Date/Time:  Friday October 05 2020 09:07:47 EDT Ventricular Rate:  78 PR Interval:  63 QRS Duration: 155 QT Interval:  475 QTC Calculation: 538 R Axis:   141 Text Interpretation: Sinus rhythm Ventricular premature  complex Short PR interval Right bundle branch block Confirmed by Alona Bene [phone]) on 10/19/2020 3:13:42 PM  ____________________________________________  [id number]  DG Chest Portable 1 View  Result Date: 10/19/2020 CLINICAL DATA:  Altered mental status.  Left hand swelling. EXAM: PORTABLE CHEST 1 VIEW COMPARISON:  05/14/2020 FINDINGS: The patient is rotated to the right on today's radiograph, reducing diagnostic sensitivity and specificity. Prior TAVR. Prior median sternotomy. Micra-type intracardiac pacer. Low lung volumes are present, causing crowding of the pulmonary vasculature. Mild linear opacity at the right lung base favoring atelectasis. No blunting of the costophrenic angles. Chronic T12 compression fracture. IMPRESSION: 1. Suspected mild atelectasis at the right lung base. Low lung volumes. 2. Aortic Atherosclerosis (ICD10-I70.0). 3. Chronic T12 compression fracture. Electronically Signed   By: Van Clines M.D.   On: 10/10/2020 10:02   DG Hand Complete Left  Result Date: 10/19/2020 CLINICAL DATA:  Hand swelling EXAM: LEFT HAND - COMPLETE 3+ VIEW COMPARISON:  None. FINDINGS: There is no acute fracture or dislocation. Alignment is normal. The joint spaces are preserved. The bones are diffusely demineralized. Vascular calcifications are noted. There is diffuse soft tissue swelling about the hand. There is no soft tissue gas. There is no radiopaque foreign body. IMPRESSION: Diffuse soft tissue swelling about the hand with no underlying acute osseous abnormality. Electronically Signed   By: Valetta Mole MD   On: 10/19/2020 11:41   UE VENOUS DUPLEX [facility] 7 am - 7 pm)  Result Date: 10/21/2020 UPPER VENOUS STUDY  Patient Name:  JOSEE SPEECE  Date of Exam:   09/30/2020 Medical Rec #: [id number]         Accession #:    [id number] Date of Birth: November 09, 1945         Patient Gender: F Patient Age:   22 years Exam Location:  [facility] Procedure:      VAS [place] UPPER  EXTREMITY VENOUS DUPLEX Referring Phys: Vonna Kotyk Cleora Karnik --------------------------------------------------------------------------------  Indications: Swelling Comparison Study: 05/11/20 prior Performing Technologist: Archie Patten RVS  Examination Guidelines: A complete evaluation includes B-mode imaging, spectral Doppler, color Doppler, and power Doppler as needed of all accessible portions of each vessel. Bilateral testing is considered an integral part of a complete examination. Limited examinations for reoccurring indications may be performed as noted.  Right Findings: +----------+------------+---------+-----------+----------+-------+ RIGHT     CompressiblePhasicitySpontaneousPropertiesSummary +----------+------------+---------+-----------+----------+-------+ Subclavian    Full       Yes       Yes                      +----------+------------+---------+-----------+----------+-------+  Left Findings: +----------+------------+---------+-----------+----------+--------------+ LEFT      CompressiblePhasicitySpontaneousProperties   Summary     +----------+------------+---------+-----------+----------+--------------+ IJV           Full       Yes       Yes                             +----------+------------+---------+-----------+----------+--------------+ Subclavian    Full       Yes       Yes                             +----------+------------+---------+-----------+----------+--------------+ Axillary      Full       Yes       Yes                             +----------+------------+---------+-----------+----------+--------------+  Brachial      Full       Yes       Yes                             +----------+------------+---------+-----------+----------+--------------+ Radial        Full                                                 +----------+------------+---------+-----------+----------+--------------+ Ulnar         Full                                                  +----------+------------+---------+-----------+----------+--------------+ Cephalic                                            Not visualized +----------+------------+---------+-----------+----------+--------------+ Basilic       Full                                                 +----------+------------+---------+-----------+----------+--------------+  Summary:  Right: No evidence of thrombosis in the subclavian.  Left: No evidence of deep vein thrombosis in the upper extremity. No evidence of superficial vein thrombosis in the upper extremity.  *See table(s) above for measurements and observations.    Preliminary     ____________________________________________   PROCEDURES  Procedure(s) performed:   .Critical Care  Date/Time: 09/27/2020 3:14 PM Performed by: Margette Fast, MD Authorized by: Margette Fast, MD   Critical care provider statement:    Critical care time (minutes):  45   Critical care time was exclusive of:  Separately billable procedures and treating other patients and teaching time   Critical care was necessary to treat or prevent imminent or life-threatening deterioration of the following conditions:  Dehydration and CNS failure or compromise   Critical care was time spent personally by me on the following activities:  Discussions with consultants, evaluation of patient's response to treatment, examination of patient, ordering and performing treatments and interventions, ordering and review of laboratory studies, ordering and review of radiographic studies, pulse oximetry, re-evaluation of patient's condition, obtaining history from patient or surrogate, review of old charts, development of treatment plan with patient or surrogate and blood draw for specimens   I assumed direction of critical care for this patient from another provider in my specialty: no     Care discussed with: admitting provider      ____________________________________________   INITIAL IMPRESSION / [place] / ED COURSE  Pertinent labs & imaging results that were available during my care of the patient were reviewed by me and considered in my medical decision making (see chart for details).   Patient presents to the emergency department with altered mental status since yesterday.  Some left hand swelling noted.  No cellulitis of the hand.  There is pitting edema.  Plan for left upper extremity ultrasound and plain films.  Will obtain  chest x-ray along with CT scan of the head and labs including cultures.  Has had altered mental status in the past related to underlying infection.  In discussion with family, patient has apparently expressed her wish to be FULL CODE and is not on Hospice at this time.   Labs reviewed. Lactate severely elevated but no leukocytosis. No fever. No clear infection source. Right EJ placed and IVF started along with abx. No hypotension here. CT head reviewed. Awaiting official read. Family ok with continuing current level of care. Daughter at bedside defers to the oldest daughter for more detailed goals of care discussion. Patient with no respiratory distress. No hypoxemia. Abdominal exam and presentation does not seem consistent with ischemic bowel. Sepsis is a consideration. Lactate that high unlikely to be due to severe dehydration. No observed seizure activity at home or here in the ED.   Discussed patient's case with IM teaching service to request admission. Patient and family (if present) updated with plan. Care transferred to Medicine service.  I reviewed all nursing notes, vitals, pertinent old records, EKGs, labs, imaging (as available).  ____________________________________________  FINAL CLINICAL IMPRESSION(S) / ED DIAGNOSES  Final diagnoses:  Somnolence  Lactic acidosis    MEDICATIONS GIVEN DURING THIS VISIT:  Medications  0.9 %  sodium chloride infusion (  Intravenous Not Given 10/23/2020 1335)  0.9 %  sodium chloride infusion ( Intravenous New Bag/Given 09/25/2020 1340)  metroNIDAZOLE (FLAGYL) IVPB 500 mg (500 mg Intravenous New Bag/Given 10/16/2020 1427)  vancomycin (VANCOREADY) IVPB 1500 mg/300 mL (has no administration in time range)  ceFEPIme (MAXIPIME) 2 g in sodium chloride 0.9 % 100 mL IVPB (has no administration in time range)  vancomycin variable dose per unstable renal function (pharmacist dosing) (has no administration in time range)  sodium chloride 0.9 % bolus 500 mL (500 mLs Intravenous New Bag/Given 10/23/2020 1222)  sodium chloride 0.9 % bolus 1,000 mL (1,000 mLs Intravenous New Bag/Given 10/24/2020 1340)  ceFEPIme (MAXIPIME) 2 g in sodium chloride 0.9 % 100 mL IVPB (0 g Intravenous Stopped 10/18/2020 1425)    Note:  This document was prepared using Dragon voice recognition software and may include unintentional dictation errors.  Nanda Quinton, MD, [facility] Emergency Medicine    Diya Gervasi, [facility], MD 10/12/2020 1520

## 2020-10-05 NOTE — ED Notes (Signed)
Attempted to call report. RN getting report on another pt. Extension given for call back.

## 2020-10-05 NOTE — H&P (Signed)
Date: 10/22/2020               Patient Name:  [name] MRN: [id number]  DOB: November 12, 1945 Age / Sex: 75 y.o., female   PCP: Adron Bene, MD         Medical Service: Internal Medicine Teaching Service         Attending Physician: Dr. Jacqulyn Bath, Arlyss Repress, MD    First Contact: Carmel Sacramento, MD Pager: MP [phone]  Second Contact: Eliezer Bottom, MD Pager: Janene Harvey [phone]       After Hours (After 5p/  First Contact Pager: [phone]  weekends / holidays): Second Contact Pager: [phone]   SUBJECTIVE  Chief Complaint: Altered mental status   History of Present Illness: [name] is a 75 y.o. female with a pertinent PMH of HFpEF EF 60-65% (03/14/20), chronic sacral decubitus wound, CKD stage 4, AS s/p TAVR, who presents to [facility] via EMS due to AMS for 1 day. Hx provided by daughter, as pt is unable to. Daughter states that the pt has significantly less interactive and arousable, and does not communicate. At baseline, she is able to express herself in short phrases. Also, she has left hand and left feet swelling x 1 day. Daughter notes no change in medications, no recent infection, no sick contacts, and cannot provide any explanation for this sudden change in mentation. She low very poor oral intake for several months, which consists of sips of diluted soup.   Daughter states that she was told by prior hospice team that the pt has a 2 week life expectancy a couple months ago, and daughter is prepared for it when the time comes, but would like any acute medical problems to be treated. She expresses that she does not want her mother to pass at a hospital, and would rather have her home once acute causes are addressed.   In the ED, Lipase wnl, lactic acid 6.8, WBC 9.0, platelets 126. Blood cultures pending. The patient was started on NS 150cc/hr, and vanc and cefepime. CXR negative. Hand xray shows no osseous abnormality. Head CT negative.   Medications: No current facility-administered medications on  file prior to encounter.   Current Outpatient Medications on File Prior to Encounter  Medication Sig Dispense Refill   acetaminophen (TYLENOL) 500 MG tablet Take 500 mg by mouth every 6 (six) hours as needed for mild pain or headache.     clopidogrel (PLAVIX) 75 MG tablet Take 1 tablet (75 mg total) by mouth daily with breakfast. 90 tablet 3   diclofenac Sodium (VOLTAREN) 1 % GEL Apply 4 g topically 4 (four) times daily as needed (Pain). Apply to bilateral hips and knees     lactulose (CHRONULAC) 10 GM/15ML solution Take 15 mLs (10 g total) by mouth daily as needed for mild constipation. Uptitrate as needed for 1 soft bowel movement per day. 946 mL 0   melatonin 3 MG TABS tablet Take 6 mg by mouth at bedtime as needed (sleep).     PARoxetine (PAXIL) 10 MG tablet Take 10 mg by mouth daily.     polyethylene glycol (MIRALAX / GLYCOLAX) 17 g packet Take 17 g by mouth daily as needed for mild constipation.     tiZANidine (ZANAFLEX) 4 MG tablet Take 4 mg by mouth at bedtime as needed for muscle spasms.     aspirin 81 MG EC tablet TAKE 1 TABLET (81 MG TOTAL) BY MOUTH DAILY. SWALLOW WHOLE. (Patient not taking: No sig reported) 90 tablet  3   Gauze Pads & Dressings (KERLIX BANDAGE ROLL 2-1/4"X9') MISC Use for dressing changes every other day 96 each 2    Past Medical History:  Past Medical History:  Diagnosis Date   Acute on chronic diastolic CHF (congestive heart failure) ([place]) 06/22/2017   Acute on chronic renal failure ([place]) 05/25/2011   Adenocarcinoma of breast ([place]) 1996   Completed tamoxifen and had mastectomy.   Aortic stenosis, severe    s/p aortic valve replacement with porcine valve 06/2004.  ECHO 2010 EF 68%, LVH, diastolic dysfxn, Bioprostetic aoritc valve, mild AS. ECHO 2013 EF 60%, Nl aortic artificial valve, dynamic obstruction in the outflow tract   Class IIb rec for annual TTE after 5 yrs. She had a TTE 2013     Arthritis    CAD (coronary artery disease) 2006   s/p CABG (5/06) w/  saphenous vein to RCA at time of AVR   CKD (chronic kidney disease) stage 2, GFR 60-89 ml/min 06/03/2011   She is no longer taking NSAIDs.    CVA (cerebral infarction) 2006   Post-op from AVR. Presumed embolic in nature. Carotid stenosis of R 60-79%. Repeat dopplers 4/10 no R stenosis and L stenosis of 1-29%.   Depression    Controlled on Paxil   Diabetes mellitus 1992   Dx 04/25/1990. Now insulin dependent, started 2008. On ACEI.    Diverticulosis 2001   History of 2019 novel coronavirus disease (COVID-19)    Hyperlipidemia    Mgmt with a statin   Hypertension    Requires 4 drug tx   Osteoporosis 2006   DEXA 10/06 : L femur T -2.8, R -2.7. Lumbar T -2.4. On bisphosphonates and  Calcium / Vit D.   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 IADLs/ADLs- patient is bedridden and relies on daughter and caregiver.    Family History: Family History  Problem Relation Age of Onset   Diabetes Mother    Hypertension Mother    Alzheimer's disease Mother    Heart disease Father 5       AMI at age 13 and 39   Mental illness Sister    Heart disease Sister 7       AMI   Kidney disease Sister     Allergies: Allergies as of 09/25/2020 - Review Complete 10/09/2020  Allergen Reaction Noted   Doxycycline  04/05/2020   Other Other (See Comments) 07/21/2017    Review of Systems: Unable to complete ROS due to functional staus.   OBJECTIVE:  Physical Exam: Blood pressure 105/63, pulse (!) 104, temperature (!) 97.5 F (36.4 C), temperature source Oral, resp. rate 14, SpO2 99 %.  Constitutional: patient is somnolent, does not follow commands, does not appear to be in any acute distress HENT:  normocephalic, atraumatic, mucous membranes moist Eyes: conjunctiva non-erythematous, extraocular movements intact Neck: supple Cardiovascular: regular rate and rhythm, no m/r/g Pulmonary/Chest: normal work of breathing on room air, lungs clear to auscultation bilaterally Abdominal: soft, no apparent tenderness to palpation, non-distended Neurological: patient not oriented to person, place or time Skin: cool extremities, particularly left foot Extremities: feet cool to touch bilaterally   EKG: normal sinus rhythm   CXR: no  acute cardiopulmonary findings  ASSESSMENT & PLAN:  Assessment: Active Problems:   * No active hospital problems. *   [name] is a 75 y.o. female with a pertinent PMH of HFpEF EF 60-65% (03/14/20), chronic sacral decubitus wound, CKD stage 4, AS s/p TAVR, who presents to [facility] via EMS due to AMS for 1 day and admitted for possible infection and DVT.   Altered mental status Patient with altered mental status x 1 day in the setting of decreased oral intake for several months, with a lactic acid of 6.8 -> repeat 6.1. WBC 9, and pt afebrile. Patient is mostly non-verbal at baseline, but since yesterday she is not arousable, and approaching end of life, per hospice. Given presentation and lactic acid level, possibly 2/2 systemic infection. -Started vanc and cefepime  -blood cultures pending  -urine culture pending  -urinalysis pending  -monitor I&Os -PT/OT -SLP eval  -Palliative care consulted  -Will consider MRI head when patient is more stable   DVT Possible PE Pt with previous PMH of breast cancer who has been bed-ridden for several months, with unilateral left leg swelling and left hand swelling for at about 1 day. Tachy 110s, SBP 105-125s. Left lower extremity vascular US show age indeterminate deep vein thrombosis involving the left common femoral vein, left femoral vein, left popliteal vein, and left posterior tibial veins. Left upper vasc US normal. EKG  sinus rhythm. Holding off on CTA, given AKI (Cr 1.86).  -Echo pending to evaluate for right heart stain -therapeutic dose Lovenox, per pharmacy   Peripheral vascular diease Patient with a hx of CAD, DM2, HTN, HLD with cool to touch extremities bilaterally. Patient cannot provide a hx about claudication or pain. Last A1C 5.8 (04/04/20).  -ABI pending   AKI Found to have a Cr 1.86 (baseline 1.2). Given patient has diminished cognitive function and minimal PO intake limited to sips of soup a couple times a day, likely prerenal azotemia. Renal U/S not necessary at this time.  -On NS 150 cc/hr  -avoid nephrogenic agents -Daily BMP   Macrocytic anemia  Hg 7.9, and is at baseline. MCV 106.6. Likely from folate deficiency in setting of decreased nutrition.  -Folate levels -VIt B12 levels    Best Practice: Diet: heart healthy  IVF: NS,150 cc/hr VTE: Enoxaparin 30 mg Code: DNR    Signature: Lajean Manes, MD  Internal Medicine Resident, PGY-1 [facility]  Pager: [phone] 3:51 PM, 10/22/2020  After 5pm on weekdays and 1pm on weekends: On Call pager [phone]

## 2020-10-05 NOTE — Progress Notes (Signed)
Left lower extremity venous duplex has been completed. Preliminary results can be found in CV Proc through chart review.  Results were given to Dr. Marva Panda.  10/19/2020 4:23 PM Carlos Levering RVT

## 2020-10-05 NOTE — ED Notes (Signed)
Unable to obtain IV access; IV team consulted. Provider made aware.

## 2020-10-05 NOTE — ED Notes (Signed)
IV team unable to obtain IV access. RN to attempt with [place].

## 2020-10-05 NOTE — ED Notes (Signed)
Attempted to call report to 3E, was on hold for 10 minutes, called back and nurse was unavailable, stated they will call back.

## 2020-10-05 NOTE — ED Notes (Signed)
Patient transported to CT 

## 2020-10-05 NOTE — Progress Notes (Signed)
   09/27/2020 2334  Vitals  Temp 98.4 F (36.9 C)  Temp Source Axillary  BP 115/64  MAP (mmHg) 79  BP Location Left Arm  BP Method Automatic  Patient Position (if appropriate) Lying  ECG Heart Rate 96  Resp 19  Level of Consciousness  Level of Consciousness Unresponsive  MEWS COLOR  MEWS Score Color Yellow  Oxygen Therapy  SpO2 100 %  O2 Device Room Air  Pain Assessment  Pain Scale Faces  Faces Pain Scale 0  MEWS Score  MEWS Temp 0  MEWS Systolic 0  MEWS Pulse 0  MEWS RR 0  MEWS LOC 3  MEWS Score 3  Admitted pt from ED to rm 3E09 via stretcher, pt is nonverbal, unresponsive. Transfer to bed, placed on cardiac monitor, CCMD made aware. Pt has unstageable pressure ulcer to the sacrum. Lactic acid 6.1, Dr. Marlou Sa is aware, rapid response nurse and charge nurse  is aware.

## 2020-10-05 NOTE — Progress Notes (Signed)
ANTICOAGULATION CONSULT NOTE - Initial Consult  Pharmacy Consult for Enoxaparin Indication: Suspected  VTE  Allergies  Allergen Reactions   Doxycycline     Made her stopped eating    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," as the patient is a Jehovah's Witness   Vital Signs: Temp: 98.6 F (37 C) (08/12 1730) Temp Source: Oral (08/12 0904) BP: 115/75 (08/12 1730) Pulse Rate: 104 (08/12 1730)  Labs: Recent Labs    10/10/2020 1212  HGB 7.9*  HCT 24.3*  PLT 126*  CREATININE 1.86*    CrCl cannot be calculated (Unknown ideal weight.).  Medical History: Past Medical History:  Diagnosis Date   Acute on chronic diastolic CHF (congestive heart failure) ([place]) 06/22/2017   Acute on chronic renal failure ([place]) 05/25/2011   Adenocarcinoma of breast ([place]) 1996   Completed tamoxifen and had mastectomy.   Aortic stenosis, severe    s/p aortic valve replacement with porcine valve 06/2004.  ECHO 2010 EF 24%, LVH, diastolic dysfxn, Bioprostetic aoritc valve, mild AS. ECHO 2013 EF 60%, Nl aortic artificial valve, dynamic obstruction in the outflow tract   Class IIb rec for annual TTE after 5 yrs. She had a TTE 2013     Arthritis    CAD (coronary artery disease) 2006   s/p CABG (5/06) w/ saphenous vein to RCA at time of AVR   CKD (chronic kidney disease) stage 2, GFR 60-89 ml/min 06/03/2011   She is no longer taking NSAIDs.    CVA (cerebral infarction) 2006   Post-op from AVR. Presumed embolic in nature. Carotid stenosis of R 60-79%. Repeat dopplers 4/10 no R stenosis and L stenosis of 1-29%.   Depression    Controlled on Paxil   Diabetes mellitus 1992   Dx 04/25/1990. Now insulin dependent, started 2008. On ACEI.    Diverticulosis 2001   History of 2019 novel coronavirus disease (COVID-19)    Hyperlipidemia    Mgmt with a statin   Hypertension    Requires 4 drug tx   Osteoporosis 2006   DEXA 10/06 : L femur T -2.8, R -2.7. Lumbar T -2.4. On bisphosphonates and  Calcium / Vit D.    Peripheral vascular disease ([place])    Presence of permanent cardiac pacemaker    Refusal of blood transfusions as patient is Jehovah's Witness    Stroke [facility])    Medications:  (Not in a hospital admission)  Scheduled:   enoxaparin (LOVENOX) injection  60 mg Subcutaneous Q24H   sodium chloride flush  3 mL Intravenous Q12H   vancomycin variable dose per unstable renal function (pharmacist dosing)   Does not apply See admin instructions   Infusions:   sodium chloride     sodium chloride Stopped (09/24/2020 1839)   [START ON 10/19/20] ceFEPime (MAXIPIME) IV     Assessment: 78 yof with history of HFpEF EF 60-65% (03/14/20), breast cancer, chronic sacral decubitus wound, CKD stage 4, AS s/p TAVR. Patient is presenting with AMS x 1 day.  Patient also presenting with unilateral left leg and left hand swelling. LLE [place] with indeterminate DVT of left common femoral vein, left femoral vein, left popliteal vein, and left posterior tibial veins. Holding off on CTA given renal function; ECHO pending.  Enoxaparin consult placed - admitting team is trying to reduce number of lab sticks given patient's hospice / palliative care status.  Unable to weight patient in ED at current time, as patient not in a weighing bed and standing weight  is not possible. Historical records used to determine an estimated 65kg for CrCl estimation and dosing weight.  Patient's hgb is 7.9 which is higher end of baseline for patient. Plt 126. Both discussed with admitting team. Will monitor.  Goal of Therapy:  Monitor platelets by anticoagulation protocol: Yes   Plan:  Height and weight ordered - F/u patient weight once admitted to ensure dosing weight is appropriate Lovenox 60 mg q24hr Monitor renal function and adjust dosing as needed F/u palliative care consultation and recommendations F/u ECHO Follow Hgb closely, anemic at baseline  Lorelei Pont, PharmD, BCPS 10/11/2020 8:57 PM ED Clinical Pharmacist -   [phone]

## 2020-10-05 NOTE — Progress Notes (Signed)
Pharmacy Antibiotic Note  [name] is a 75 y.o. female admitted on 10/23/2020 with sepsis.  Pharmacy has been consulted for cefepime and vancomycin dosing. Last known weight was 136 lb, I used 65 kg as an estimate. Scr 1.86 (BL ~1.2) and estimated CrCl is 26 mL/min. Patient presented with AMS, could not speak, LA 6.8, WBC 9.  Plan: Cefepime 2 g IV q24 Vancomycin 1750 mg IV x1 Vancomycin variable ordered d/t variable renal function. Monitor renal function, clinical status, and abx plan.     Temp (24hrs), Avg:97.5 F (36.4 C), Min:97.5 F (36.4 C), Max:97.5 F (36.4 C)  Recent Labs  Lab 10/17/2020 1212  WBC 9.0  CREATININE 1.86*  LATICACIDVEN 6.8*    CrCl cannot be calculated (Unknown ideal weight.).    Allergies  Allergen Reactions   Doxycycline     Made her stopped eating    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," as the patient is a Jehovah's Witness    Antimicrobials this admission: Vancomycin 8/12 >>  Cefepime 8/12 >>   Dose adjustments this admission: None.  Microbiology results: 8/12 BCx: in process 8/12 UCx: ordered    Thank you for allowing pharmacy to be a part of this patient's care.  Varney Daily, PharmD PGY1 Pharmacy Resident  Please check AMION for all [facility] pharmacy phone numbers After 10:00 PM call main pharmacy [phone]

## 2020-10-05 NOTE — Progress Notes (Signed)
Pt lactic Acid was 6.1, unresponsive, from hospice at home. Per IM, to treat pt still  and per IM pt is hemodynamically stable but will not be on progressive care,  as pt will be on end of life.

## 2020-10-05 NOTE — Progress Notes (Signed)
Upper extremity venous has been completed.   Preliminary results in CV Proc.   [name] 10/22/2020 10:05 AM

## 2020-10-05 NOTE — ED Notes (Signed)
Nurse from 3e called back and would not take report until she speaks with attending physician about level of care of Pt. States she will call back after she speaks with physician.

## 2020-10-06 ENCOUNTER — Other Ambulatory Visit (HOSPITAL_COMMUNITY): Payer: Medicare (Managed Care)

## 2020-10-06 DIAGNOSIS — R4182 Altered mental status, unspecified: Secondary | ICD-10-CM

## 2020-10-06 LAB — BLOOD CULTURE ID PANEL (REFLEXED) - BCID2

## 2020-10-06 MED ORDER — SODIUM CHLORIDE 0.9 % IV BOLUS (SEPSIS): 1000.0000 mL | Freq: Once | INTRAVENOUS | Status: DC

## 2020-10-08 LAB — CULTURE, BLOOD (ROUTINE X 2): Special Requests: ADEQUATE

## 2020-10-10 LAB — CULTURE, BLOOD (ROUTINE X 2): Culture: NO GROWTH

## 2020-10-25 NOTE — Progress Notes (Signed)
Received call from [place], [place] is ready to be D/C home with hospice via [place]. Contacted pt's daughter (Rechel). Verified home address. She reports that pt is active with [facility]. Arranged transportation with [facility].

## 2020-10-25 NOTE — Progress Notes (Signed)
     Referral received for [name] for goals of care discussion. Chart reviewed and updates received from RN.   Patient established with home hospice and was given 2 week life expectation 2 months ago. Daughter states she is prepared for her mother's passing but requesting treatment of any acute medical issue. Does not want mother to pass in the hospital and would like d/c to home back with hospice.  Plans for d/c this morning. Spoke with Dr. Posey Pronto with IM resident service, d/c orders being entered and ok to sign off.  Thank you for your referral and allowing PMT to assist in [place] care.   Walden Field, NP Palliative Medicine Team Phone: [phone]  NO CHARGE

## 2020-10-25 NOTE — Progress Notes (Signed)
Dr. Patel at bedside 

## 2020-10-25 NOTE — Progress Notes (Signed)
No urine output for pt since admission to the floor, bladder scan showed 44ml, pt spitting out dark brown colored secretion, BP=83/57, P=105, O2 sat=98% on room air. 2 daughters at bedside requesting to talk to the doctor, wants patient discharge to home. Spoke to Dr.  Marlou Sa and was told that morning team will come and talk to the family. Daughters made aware.

## 2020-10-25 NOTE — Discharge Summary (Addendum)
Name: [name] MRN: [id number] DOB: 07/18/1945 75 y.o. PCP: Delene Ruffini, MD  Date of Admission: 10/14/2020  9:06 AM Date of Discharge:  11-01-2020 Attending Physician: Dr. Jimmye Norman  DISCHARGE DIAGNOSIS:  Primary Problem: <principal problem not specified>   Hospital Problems: Active Problems:   Altered mental status    DISCHARGE MEDICATIONS:   Allergies as of 11/01/2020       Reactions   Doxycycline    Made her stopped eating    Other Other (See Comments)   NO "blood products," as the patient is a Jehovah's Witness        Medication List     STOP taking these medications    acetaminophen 500 MG tablet Commonly known as: TYLENOL   aspirin 81 MG EC tablet   clopidogrel 75 MG tablet Commonly known as: PLAVIX   diclofenac Sodium 1 % Gel Commonly known as: VOLTAREN   Kerlix Bandage Roll 2-1/4"x9' Misc   lactulose 10 GM/15ML solution Commonly known as: CHRONULAC   melatonin 3 MG Tabs tablet   PARoxetine 10 MG tablet Commonly known as: PAXIL   polyethylene glycol 17 g packet Commonly known as: MIRALAX / GLYCOLAX   tiZANidine 4 MG tablet Commonly known as: ZANAFLEX        DISPOSITION AND FOLLOW-UP:  [name] was discharged from [facility] in expired condition.   Follow-up Recommendations: Consults: none  Labs: none Studies: none Medications: none   Follow-up Appointments:  none  HOSPITAL COURSE:  Patient Summary: [name] was admitted to hospital after period of precipitous decline, in hope of identifying a reversible problem.  She did not awaken during her brief hospitalization and passed away the morning following admission.  She had been designated as EOL for some time and her death was expected, though the family had hoped to transfer her home where she could be with loved ones.     DISCHARGE INSTRUCTIONS:   Discharge Instructions     Diet - low sodium heart healthy   Complete by: As directed     Increase activity slowly   Complete by: As directed    No wound care   Complete by: As directed        SUBJECTIVE:  No acute overnight events. Patient was seen at bedside during rounds this morning. Pt is not responsive, and is somnolent. Pt is producing tar colored saliva, likely from possible ulcer bleed. No other complains or concerns at this time. Family would like patient to be discharged home at this time during end of life stage to provide comfort care. Update - [name] passed away in her hospital room prior to being discharged.  THe below documented exam was her last, prior to passing.  Discharge Vitals:   BP (!) 74/43   Pulse 98   Temp 98 F (36.7 C) (Oral)   Resp 17   Wt 60.7 kg   SpO2 (!) 50%   BMI 21.60 kg/m   OBJECTIVE:  Constitutional: patient is somnolent, does not follow commands, and cannot follow commands.  HENT: normocephalic, atraumatic, mucous membranes moist Eyes: conjunctiva non-erythematous Neck: supple Cardiovascular: regular rate and rhythm, no m/r/g Pulmonary/Chest: increased work of breathing likely due to fragility, lungs clear to auscultation bilaterally Abdominal: soft, no apparent tenderness to palpation, non-distended Neurological: patient not oriented to person, place or time Skin: cool extremities, particularly left foot Extremities: feet cool to touch bilaterally   Pertinent Labs, Studies, and Procedures:  CBC Latest Ref Rng &  Units 10/19/2020 05/17/2020 05/16/2020  WBC 4.0 - 10.5 K/uL 9.0 11.4(H) 12.0(H)  Hemoglobin 12.0 - 15.0 g/dL 7.9(L) 5.6(LL) 5.6(LL)  Hematocrit 36.0 - 46.0 % 24.3(L) 17.6(L) 17.3(L)  Platelets 150 - 400 K/uL 126(L) 176 171    CMP Latest Ref Rng & Units 09/29/2020 05/17/2020 05/15/2020  Glucose 70 - 99 mg/dL 161(H) 173(H) 194(H)  BUN 8 - 23 mg/dL 19 43(H) 31(H)  Creatinine 0.44 - 1.00 mg/dL 1.86(H) 1.19(H) 1.25(H)  Sodium 135 - 145 mmol/L 142 143 142  Potassium 3.5 - 5.1 mmol/L 4.9 5.4(H) 5.0  Chloride 98 - 111  mmol/L 110 117(H) 111  CO2 22 - 32 mmol/L 21(L) 21(L) 25  Calcium 8.9 - 10.3 mg/dL 7.8(L) 7.3(L) 7.4(L)  Total Protein 6.5 - 8.1 g/dL 4.9(L) 4.6(L) -  Total Bilirubin 0.3 - 1.2 mg/dL 1.1 0.5 -  Alkaline Phos 38 - 126 U/L 138(H) 179(H) -  AST 15 - 41 U/L 35 28 -  ALT 0 - 44 U/L 45(H) 18 -    CT HEAD WO CONTRAST (5MM)  Result Date: 10/01/2020 CLINICAL DATA:  Mental status change EXAM: CT HEAD WITHOUT CONTRAST TECHNIQUE: Contiguous axial images were obtained from the base of the skull through the vertex without intravenous contrast. COMPARISON:  07/17/2019 FINDINGS: Brain: No evidence of acute infarction, hemorrhage, hydrocephalus, extra-axial collection or mass lesion/mass effect. Periventricular white matter changes, likely the sequela of chronic small vessel ischemic disease. Basal ganglia calcifications. Mild generalized cerebral atrophy. Vascular: No hyperdense vessel or unexpected calcification. Skull: Normal. Negative for fracture or focal lesion. Sinuses/Orbits: No acute finding. Other: Opacification of the left mastoid air cells and middle ear. IMPRESSION: 1.  No acute intracranial process. 2. Fluid in the left mastoid air cells and middle ear. Correlate with symptoms of otomastoiditis. Electronically Signed   By: Merilyn Baba M.D.   On: 10/24/2020 15:34   DG Chest Portable 1 View  Result Date: 09/26/2020 CLINICAL DATA:  Altered mental status.  Left hand swelling. EXAM: PORTABLE CHEST 1 VIEW COMPARISON:  05/14/2020 FINDINGS: The patient is rotated to the right on today's radiograph, reducing diagnostic sensitivity and specificity. Prior TAVR. Prior median sternotomy. Micra-type intracardiac pacer. Low lung volumes are present, causing crowding of the pulmonary vasculature. Mild linear opacity at the right lung base favoring atelectasis. No blunting of the costophrenic angles. Chronic T12 compression fracture. IMPRESSION: 1. Suspected mild atelectasis at the right lung base. Low lung volumes.  2. Aortic Atherosclerosis (ICD10-I70.0). 3. Chronic T12 compression fracture. Electronically Signed   By: Van Clines M.D.   On: 10/22/2020 10:02   DG Hand Complete Left  Result Date: 10/23/2020 CLINICAL DATA:  Hand swelling EXAM: LEFT HAND - COMPLETE 3+ VIEW COMPARISON:  None. FINDINGS: There is no acute fracture or dislocation. Alignment is normal. The joint spaces are preserved. The bones are diffusely demineralized. Vascular calcifications are noted. There is diffuse soft tissue swelling about the hand. There is no soft tissue gas. There is no radiopaque foreign body. IMPRESSION: Diffuse soft tissue swelling about the hand with no underlying acute osseous abnormality. Electronically Signed   By: Valetta Mole MD   On: 10/13/2020 11:41   VAS [place] LOWER EXTREMITY VENOUS (DVT)  Result Date: 10/04/2020  Lower Venous DVT Study Patient Name:  RADHA COGGINS [facility]  Date of Exam:   10/12/2020 Medical Rec #: [id number]         Accession #:    [id number] Date of Birth: 30-Jul-1945         Patient  Gender: F Patient Age:   75 years Exam Location:  [facility] Procedure:      VAS [place] LOWER EXTREMITY VENOUS (DVT) Referring Phys: JOSHUA LONG --------------------------------------------------------------------------------  Indications: Edema.  Risk Factors: None identified. Limitations: Poor ultrasound/tissue interface and patient positioning, patient immobility, shadowing from arterial calcification. Comparison Study: No prior studies. Performing Technologist: Oliver Hum RVT  Examination Guidelines: A complete evaluation includes B-mode imaging, spectral Doppler, color Doppler, and power Doppler as needed of all accessible portions of each vessel. Bilateral testing is considered an integral part of a complete examination. Limited examinations for reoccurring indications may be performed as noted. The reflux portion of the exam is performed with the patient in reverse Trendelenburg.   +-----+---------------+---------+-----------+----------+--------------+ RIGHTCompressibilityPhasicitySpontaneityPropertiesThrombus Aging +-----+---------------+---------+-----------+----------+--------------+ CFV  Full           Yes      Yes                                 +-----+---------------+---------+-----------+----------+--------------+   +---------+---------------+---------+-----------+----------+-----------------+ LEFT     CompressibilityPhasicitySpontaneityPropertiesThrombus Aging    +---------+---------------+---------+-----------+----------+-----------------+ CFV      Partial        Yes      Yes                  Age Indeterminate +---------+---------------+---------+-----------+----------+-----------------+ SFJ      Full                                                           +---------+---------------+---------+-----------+----------+-----------------+ FV Prox  Partial        No       No                   Age Indeterminate +---------+---------------+---------+-----------+----------+-----------------+ FV Mid   Partial        No       No                   Age Indeterminate +---------+---------------+---------+-----------+----------+-----------------+ FV DistalPartial        No       No                   Age Indeterminate +---------+---------------+---------+-----------+----------+-----------------+ PFV      Full                                                           +---------+---------------+---------+-----------+----------+-----------------+ POP      Partial        No       No                   Age Indeterminate +---------+---------------+---------+-----------+----------+-----------------+ PTV      Partial                                      Age Indeterminate +---------+---------------+---------+-----------+----------+-----------------+ PERO     Full                                                            +---------+---------------+---------+-----------+----------+-----------------+  EIV                     No       No                                     +---------+---------------+---------+-----------+----------+-----------------+ Unable to visualize the common iliac vein and distal aorta due to overlying bowel gas. Arterial waveforms noted in the common femoral artery appear biphasic. Arterial waveforms noted in the popliteal, posterior tibial, peroneal, and anterior tibial arteries appear monophasic.   Summary: RIGHT: - No evidence of common femoral vein obstruction.  LEFT: - Findings consistent with age indeterminate deep vein thrombosis involving the left common femoral vein, left femoral vein, left popliteal vein, and left posterior tibial veins. - No cystic structure found in the popliteal fossa.  *See table(s) above for measurements and observations.    Preliminary    UE VENOUS DUPLEX ([facility] & [facility] 7 am - 7 pm)  Result Date: 10/21/2020 UPPER VENOUS STUDY  Patient Name:  CHISTINE DEMATTEO  Date of Exam:   10/07/2020 Medical Rec #: [id number]         Accession #:    [id number] Date of Birth: 04/14/1945         Patient Gender: F Patient Age:   36 years Exam Location:  [facility] Procedure:      VAS [place] UPPER EXTREMITY VENOUS DUPLEX Referring Phys: Ivin Booty LONG --------------------------------------------------------------------------------  Indications: Swelling Comparison Study: 05/11/20 prior Performing Technologist: Argentina Ponder RVS  Examination Guidelines: A complete evaluation includes B-mode imaging, spectral Doppler, color Doppler, and power Doppler as needed of all accessible portions of each vessel. Bilateral testing is considered an integral part of a complete examination. Limited examinations for reoccurring indications may be performed as noted.  Right Findings: +----------+------------+---------+-----------+----------+-------+ RIGHT      CompressiblePhasicitySpontaneousPropertiesSummary +----------+------------+---------+-----------+----------+-------+ Subclavian    Full       Yes       Yes                      +----------+------------+---------+-----------+----------+-------+  Left Findings: +----------+------------+---------+-----------+----------+--------------+ LEFT      CompressiblePhasicitySpontaneousProperties   Summary     +----------+------------+---------+-----------+----------+--------------+ IJV           Full       Yes       Yes                             +----------+------------+---------+-----------+----------+--------------+ Subclavian    Full       Yes       Yes                             +----------+------------+---------+-----------+----------+--------------+ Axillary      Full       Yes       Yes                             +----------+------------+---------+-----------+----------+--------------+ Brachial      Full       Yes       Yes                             +----------+------------+---------+-----------+----------+--------------+ Radial  Full                                                 +----------+------------+---------+-----------+----------+--------------+ Ulnar         Full                                                 +----------+------------+---------+-----------+----------+--------------+ Cephalic                                            Not visualized +----------+------------+---------+-----------+----------+--------------+ Basilic       Full                                                 +----------+------------+---------+-----------+----------+--------------+  Summary:  Right: No evidence of thrombosis in the subclavian.  Left: No evidence of deep vein thrombosis in the upper extremity. No evidence of superficial vein thrombosis in the upper extremity.  *See table(s) above for measurements and observations.    Preliminary       Signed: Lajean Manes, MD Internal Medicine Resident, PGY-1 [facility]  Pager: [phone] 12:22 PM, 10-18-20

## 2020-10-25 NOTE — Plan of Care (Signed)

## 2020-10-25 NOTE — Progress Notes (Signed)
[facility] eval for donation of anatomical gift called, gave reference number [id number] [name])  Family states they do not know yet what funeral home they want to use, they only know they will have patient cremated.

## 2020-10-25 NOTE — Progress Notes (Signed)
OT Cancellation Note  Patient Details Name: [name] MRN: [id number] DOB: Dec 10, 1945   Cancelled Treatment:    Reason Eval/Treat Not Completed: OT screened, no needs identified, will sign off. Pt to be discharged to home with family and will have continued Home Hospice. Thank you for the referral.  Hoy Morn, [place], [place]  [phone]   2020/10/07, 10:24 AM

## 2020-10-25 NOTE — Hospital Course (Addendum)
  [name] is a 76 y.o. female with a pertinent PMH of HFpEF EF 60-65% (03/14/20), chronic sacral decubitus wound, CKD stage 4, AS s/p TAVR, who presents to [facility] via EMS due to AMS for 1 day and admitted for possible infection and DVT.    End of life support  Altered mental status Patient with altered mental status x 1 day in the setting of decreased oral intake for several months, with a lactic acid of 6.8 -> repeat 6.1. WBC 9, and pt afebrile. Patient is mostly non-verbal at baseline, but since yesterday she is not arousable, and approaching end of life, per hospice. Given presentation and lactic acid level, possibly 2/2 systemic infection. Started vanc and cefepime. 1/2 Blood cultures show MRSE.  -urine culture pending  -urinalysis pending  -Palliative care consulted  -Will consider MRI head when patient is more stable    Patient continues decompensating during hospitalization, consistent with progression of end of life. The daughters were expecting this, as they were notified by hospice in march that she had about a 2 week life expectancy. At this time, the family elected to discharge the patient for comfort care at home, rather than remaining at hospital.   DVT Possible PE Pt with previous PMH of breast cancer who has been bed-ridden for several months, with unilateral left leg swelling and left hand swelling for at about 1 day. Tachy 110s, SBP 105-125s. Left lower extremity vascular US show age indeterminate deep vein thrombosis involving the left common femoral vein, left femoral vein, left popliteal vein, and left posterior tibial veins. Left upper vasc US normal. EKG sinus rhythm. Holding off on CTA, given AKI (Cr 1.86).  -Echo pending to evaluate for right heart stain -therapeutic dose Lovenox, per pharmacy    Peripheral vascular diease Patient with a hx of CAD, DM2, HTN, HLD with cool to touch extremities bilaterally. Patient cannot provide a hx about claudication or pain. Last  A1C 5.8 (04/04/20).  -ABI pending    AKI Found to have a Cr 1.86 (baseline 1.2). Given patient has diminished cognitive function and minimal PO intake limited to sips of soup a couple times a day, likely prerenal azotemia. Renal U/S not necessary at this time.  -On NS 150 cc/hr  -avoid nephrogenic agents   Macrocytic anemia  Hg 7.9, and is at baseline. MCV 106.6. Likely from folate deficiency in setting of decreased nutrition. Folate and B12 levels ordered

## 2020-10-25 NOTE — Progress Notes (Signed)
Per Dr. Posey Pronto, pt will be discharge  to home, awaiting orders at this time.

## 2020-10-25 NOTE — Progress Notes (Signed)
SLP Cancellation Note  Patient Details Name: [name] MRN: [id number] DOB: 1945/05/06   Cancelled treatment:       Reason Eval/Treat Not Completed: Other (comment) (patient being discharged home with family and will continue with Hospice care.)   Sonia Baller, MA, [place] [facility]

## 2020-10-25 NOTE — Progress Notes (Signed)
PHARMACY - PHYSICIAN COMMUNICATION CRITICAL VALUE ALERT - BLOOD CULTURE IDENTIFICATION (BCID)  [name] is an 75 y.o. female who presented to [facility] on 10/24/2020 with a chief complaint of AMS, possible sepsis  Assessment: 1/2 blood cultures growing MR-Staphylococcus epidermidis, possible contaminant  Name of physician (or Provider) Contacted:  Dr. Lisabeth Devoid  Current antibiotics:  Vancomycin and Cefepime   Changes to prescribed antibiotics recommended:  No changes at this time  Results for orders placed or performed during the hospital encounter of 10/12/2020  Blood Culture ID Panel (Reflexed) (Collected: 10/12/2020 12:12 PM)  Result Value Ref Range   Enterococcus faecalis NOT DETECTED NOT DETECTED   Enterococcus Faecium NOT DETECTED NOT DETECTED   Listeria monocytogenes NOT DETECTED NOT DETECTED   Staphylococcus species DETECTED (A) NOT DETECTED   Staphylococcus aureus (BCID) NOT DETECTED NOT DETECTED   Staphylococcus epidermidis DETECTED (A) NOT DETECTED   Staphylococcus lugdunensis NOT DETECTED NOT DETECTED   Streptococcus species NOT DETECTED NOT DETECTED   Streptococcus agalactiae NOT DETECTED NOT DETECTED   Streptococcus pneumoniae NOT DETECTED NOT DETECTED   Streptococcus pyogenes NOT DETECTED NOT DETECTED   A.calcoaceticus-baumannii NOT DETECTED NOT DETECTED   Bacteroides fragilis NOT DETECTED NOT DETECTED   Enterobacterales NOT DETECTED NOT DETECTED   Enterobacter cloacae complex NOT DETECTED NOT DETECTED   Escherichia coli NOT DETECTED NOT DETECTED   Klebsiella aerogenes NOT DETECTED NOT DETECTED   Klebsiella oxytoca NOT DETECTED NOT DETECTED   Klebsiella pneumoniae NOT DETECTED NOT DETECTED   Proteus species NOT DETECTED NOT DETECTED   Salmonella species NOT DETECTED NOT DETECTED   Serratia marcescens NOT DETECTED NOT DETECTED   Haemophilus influenzae NOT DETECTED NOT DETECTED   Neisseria meningitidis NOT DETECTED NOT DETECTED   Pseudomonas aeruginosa NOT  DETECTED NOT DETECTED   Stenotrophomonas maltophilia NOT DETECTED NOT DETECTED   Candida albicans NOT DETECTED NOT DETECTED   Candida auris NOT DETECTED NOT DETECTED   Candida glabrata NOT DETECTED NOT DETECTED   Candida krusei NOT DETECTED NOT DETECTED   Candida parapsilosis NOT DETECTED NOT DETECTED   Candida tropicalis NOT DETECTED NOT DETECTED   Cryptococcus neoformans/gattii NOT DETECTED NOT DETECTED   Methicillin resistance mecA/C DETECTED (A) NOT DETECTED    Caryl Pina 2020/10/27  4:43 AM

## 2020-10-25 NOTE — Progress Notes (Signed)
PT Cancellation Note  Patient Details Name: [name] MRN: [id number] DOB: 02-10-1946   Cancelled Treatment:    Reason Eval/Treat Not Completed: PT screened, no needs identified, will sign off Patient being discharged home with family and will continue with Hospice care  [name] 2020-10-31, 10:11 AM

## 2020-10-25 NOTE — Discharge Instructions (Signed)
Please provide patient comfort care during her end of life transition. This includes letting her rest, and easing discomfort by keeping a soothing environment. Things to expect in the upcoming days include patient being unable to eat or drink, becoming or seeming more fatigued, and unresponsive. To the caregiver, it is important to also take care of yourself during this process.

## 2020-10-25 NOTE — Death Summary Note (Addendum)
  Name: [name] MRN: [id number] DOB: [birth year] 75 y.o.  Date of Admission: 10/13/2020  9:06 AM Date of Discharge: 28-Oct-2020 Attending Physician: Angelica Pou, MD  Discharge Diagnosis: Active Problems:   Altered mental status   Deep vein thrombosis    Acute kidney injury   Cause of death: Acute hypoxic respiratory failure  Time of death: 12:25  Disposition and follow-up:   [name] was discharged from [facility] in expired condition.    Hospital Course: [name] is a 75 y.o. female with a pertinent PMH of HFpEF EF 60-65% (03/14/20), chronic sacral decubitus wound, CKD stage 4, AS s/p TAVR, who presents to [facility] via EMS due to AMS for 1 day and admitted for possible infection and DVT. Patient with altered mental status x 1 day in the setting of decreased oral intake for several months, with a lactic acid of 6.8 -> repeat 6.1. WBC 9, and pt afebrile. Patient is mostly non-verbal at baseline, but since yesterday she is not arousable, and approaching end of life, per hospice. Given presentation and lactic acid level, possibly 2/2 systemic infection. Started vanc and cefepime. 1/2 Blood cultures show MRSE. Additionally, given patient had unilateral left leg swelling, and was tachycardic on presentation, Left lower extremity vascular US ordered and showed age indeterminate deep vein thrombosis involving the left common femoral vein, left femoral vein, left popliteal vein, and left posterior tibial veins. EKG sinus rhythm. Holding off on CTA, given AKI (Cr 1.86). Therapeutic dose Lovenox, per pharmacy, started.   Patient continues decompensating during hospitalization, consistent with progression of end of life that the daughters were anticipating since March. The daughters were expecting this, as they were notified by hospice in march that she had about a 2 week life expectancy. On exam, patient had increased work of breathing. Saturating at 98% on room  air. Goals of care and comfort care was discussed. Family elected to discharge the patient for comfort care at home, rather than remaining at hospital but [name] passed away prior to being transported.   Signed: Lajean Manes, MD  Internal Medicine Resident, PGY-1 [facility]  Pager: [phone] 3:56 PM, October 28, 2020

## 2020-10-25 DEATH — deceased

## 2021-11-08 IMAGING — DX DG CHEST 1V PORT
1 series · 1 of 1 positions shown · non-contrast
Comparison: Chest x-ray 02/24/2019.

CLINICAL DATA: Aortic valve replacement.

EXAM:
PORTABLE CHEST 1 VIEW

[chest ap]
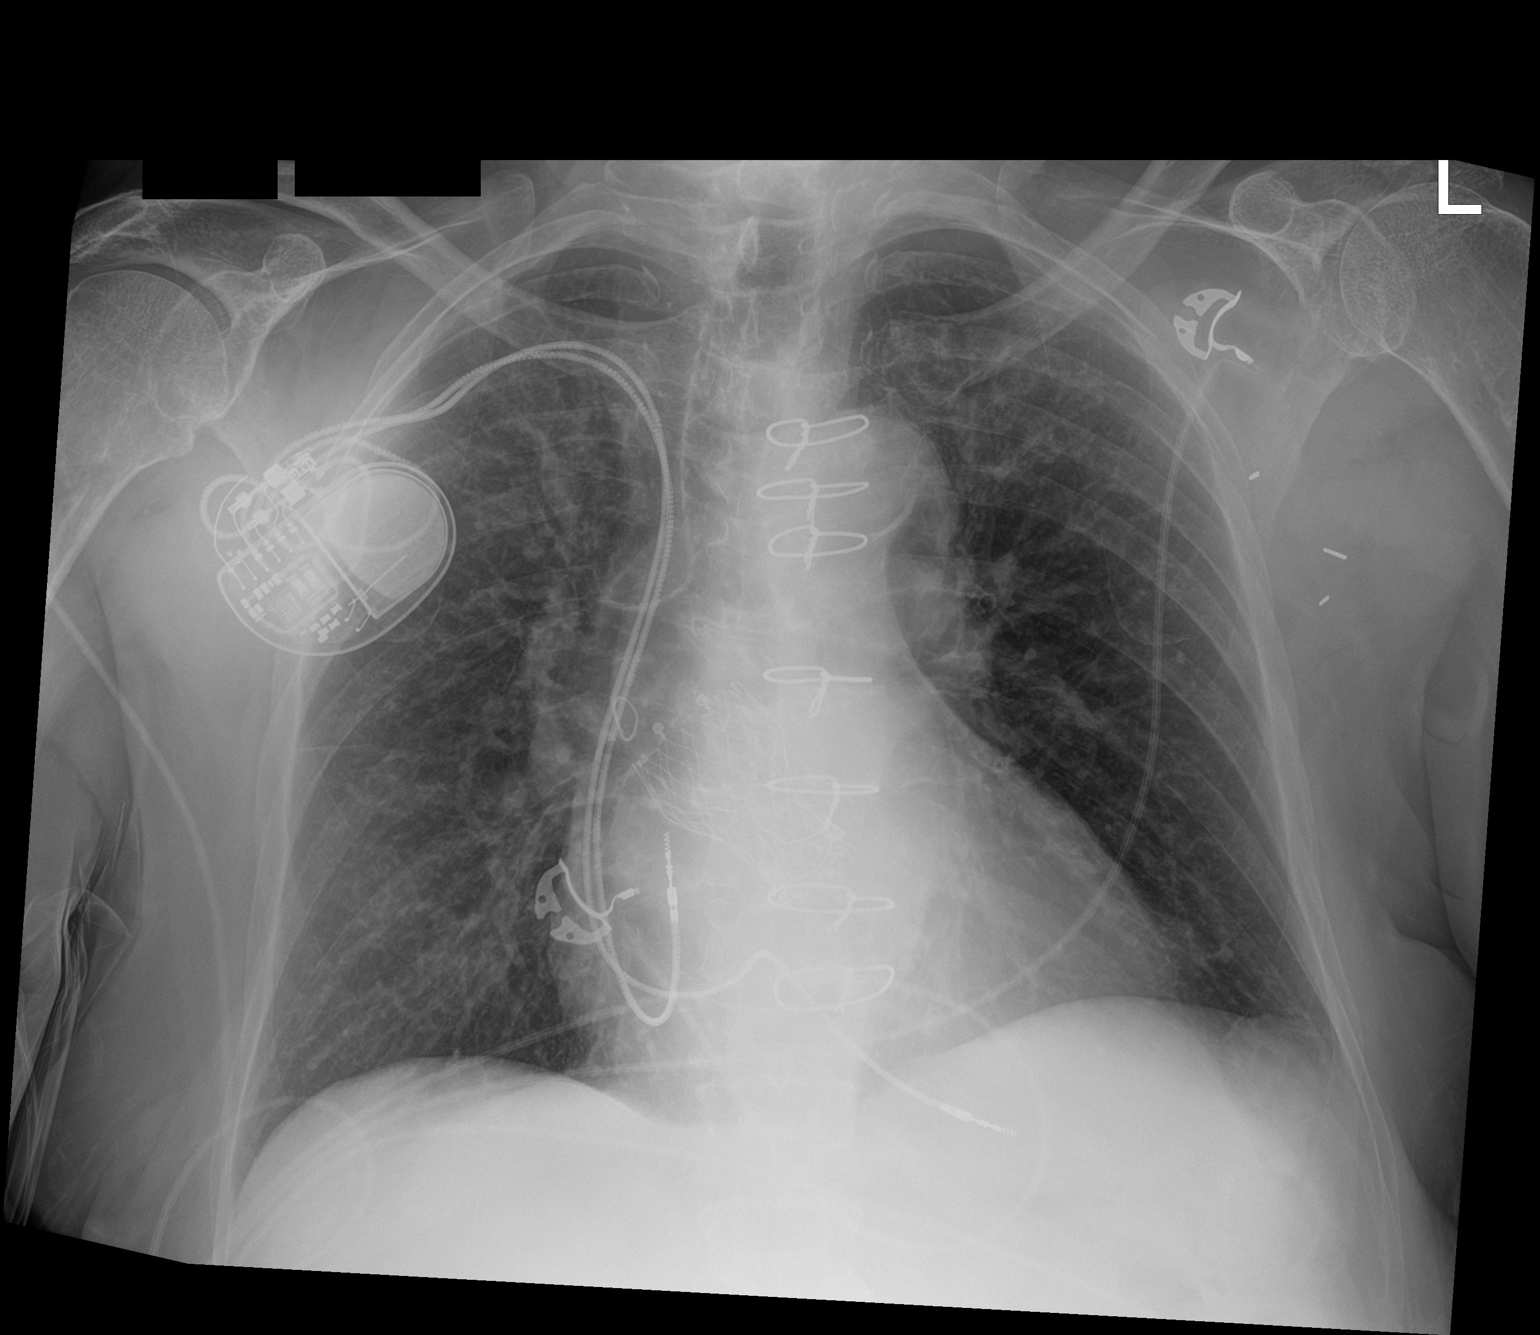

[1 of 1 positions shown; findings below may reference images not displayed]

FINDINGS: Cardiac pacer noted stable position. Prior CABG. Aortic valve
replacement. Cardiomegaly. No pulmonary venous congestion. No focal
infiltrate. No pleural effusion or pneumothorax. Surgical clips left
axilla.
IMPRESSION: 1. Cardiac pacer noted stable position. Prior CABG. Aortic valve
replacement. Cardiomegaly. No pulmonary venous congestion.

2.  No acute pulmonary disease.

## 2022-12-12 IMAGING — CT CT FOOT*R* W/O CM
2 of 3 series · 13 of 27 positions shown, 15 images · non-contrast
Comparison: Radiographs 02/08/2020, 04/03/2020

CLINICAL DATA: Concern for osteomyelitis, abnormal radiographs

EXAM:
CT OF THE RIGHT FOOT WITHOUT CONTRAST
TECHNIQUE: Multidetector CT imaging of the right foot was performed according
to the standard protocol. Multiplanar CT image reconstructions were
also generated.

[Series 5: axial st · axial · 0.54mm/px · z∈[+66,+242]mm · 8 of 104 slices shown, 10 images]
[im 8/104  soft-tissue]
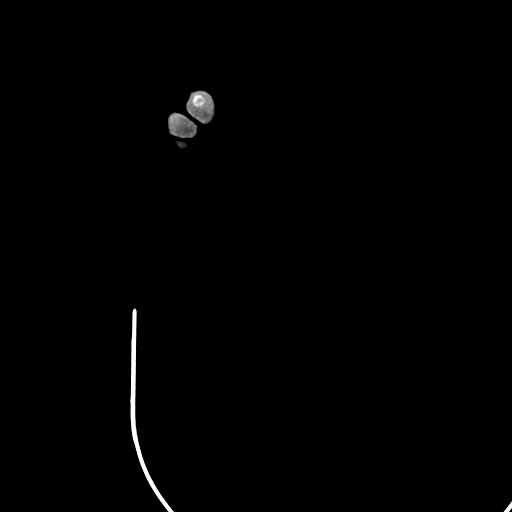
[im 8/104  bone]
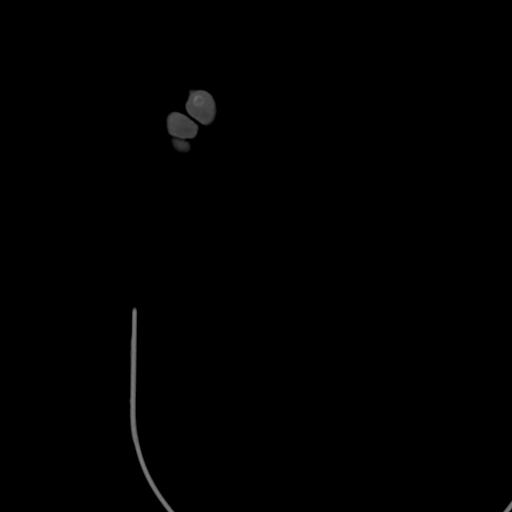
[im 24/104  bone]
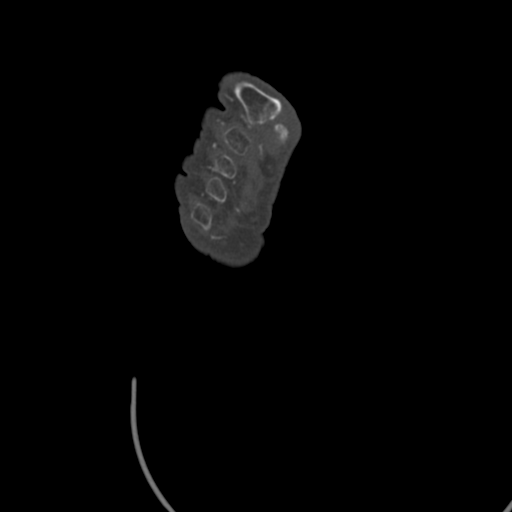
[im 32/104  bone]
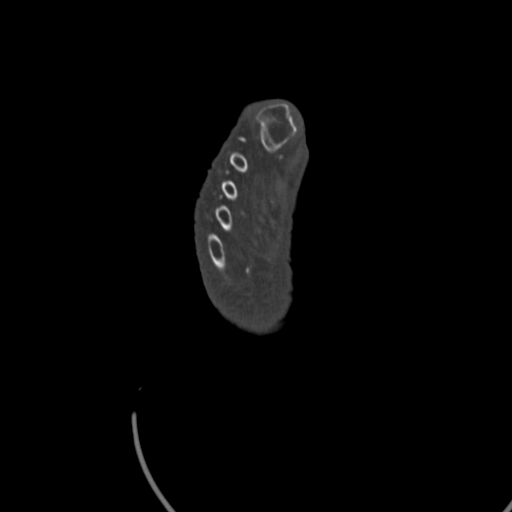
[im 48/104  bone]
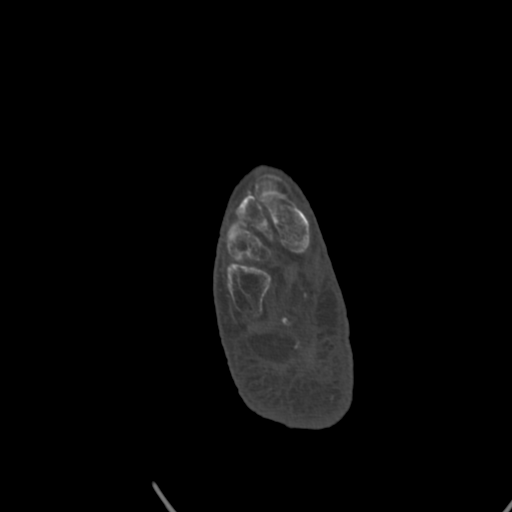
[im 56/104  soft-tissue]
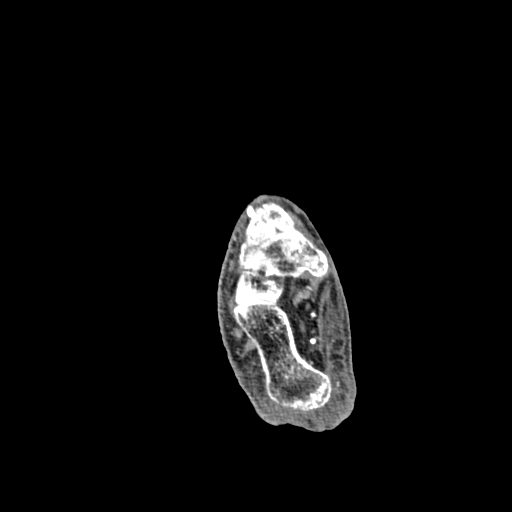
[im 56/104  bone]
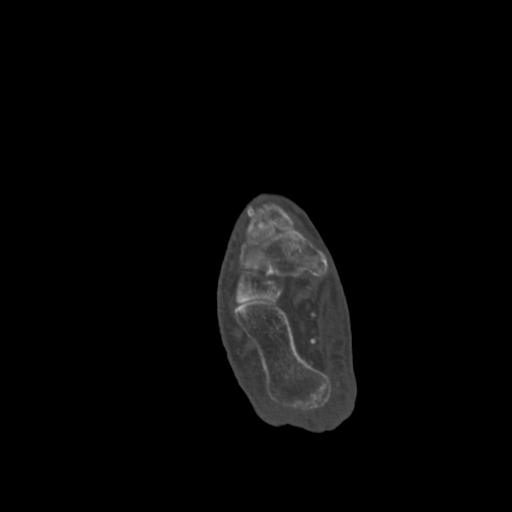
[im 72/104  bone]
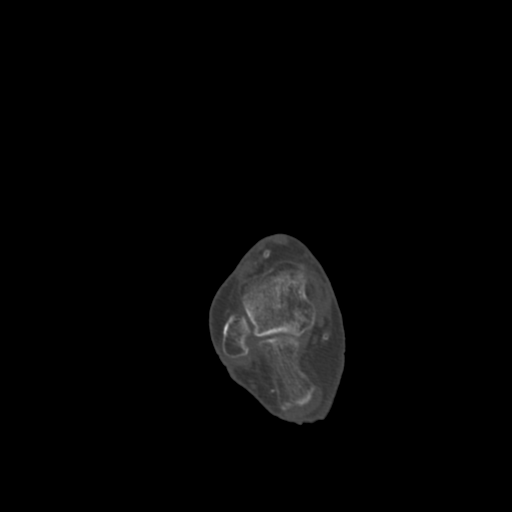
[im 80/104  bone]
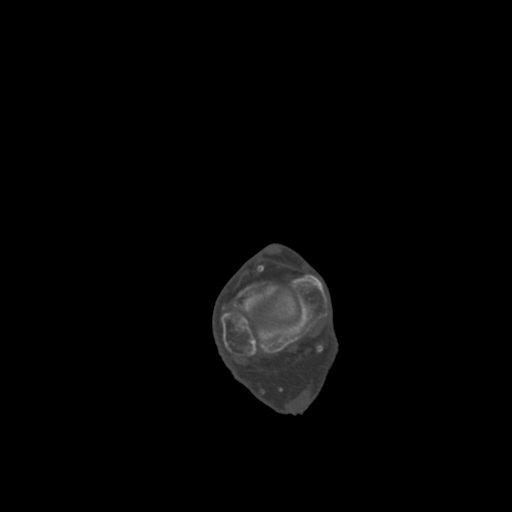
[im 96/104  bone]
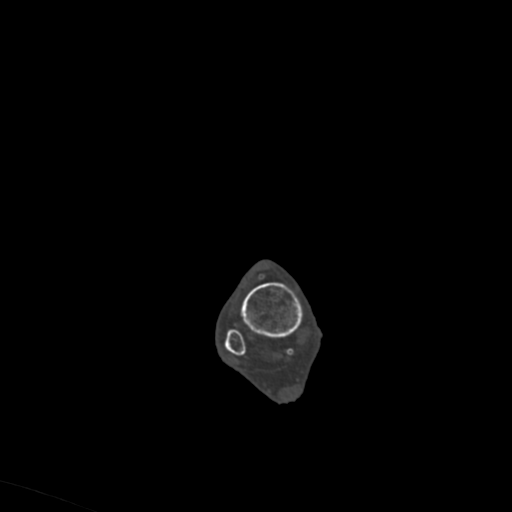

[Series 10: sagittal st · sagittal · 0.40mm/px · 5 of 111 slices shown]
[im 19/111  bone]
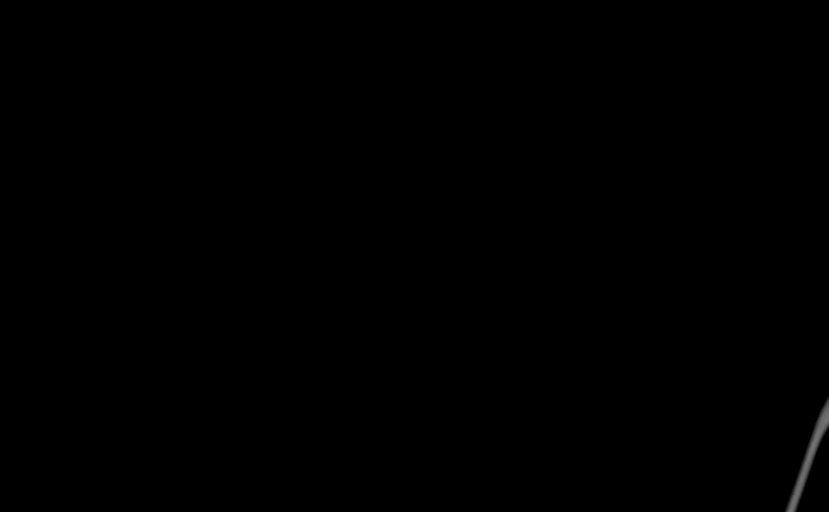
[im 37/111  bone]
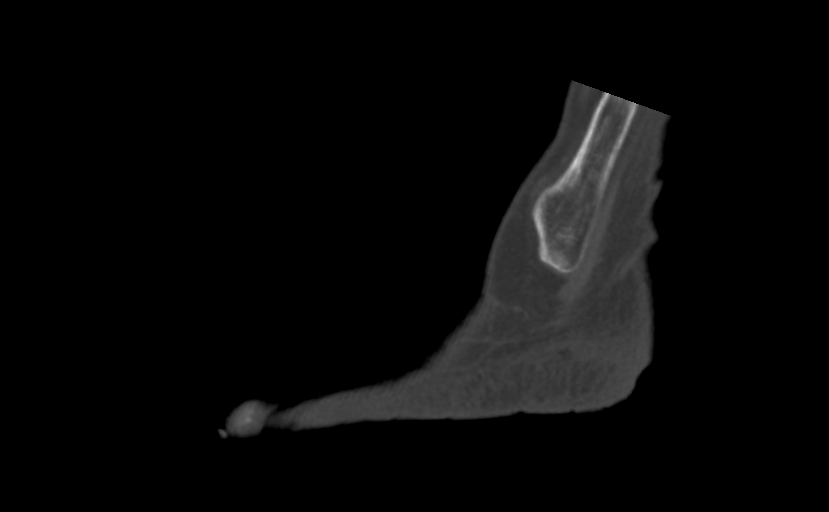
[im 56/111  bone]
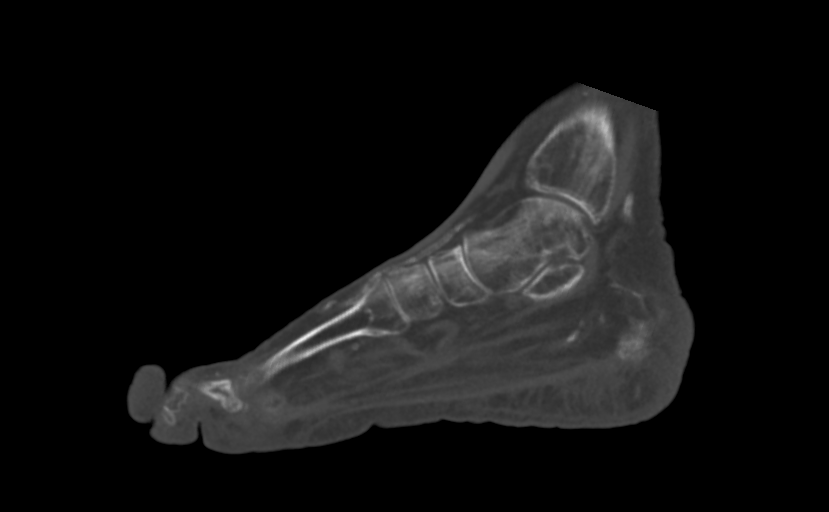
[im 74/111  bone]
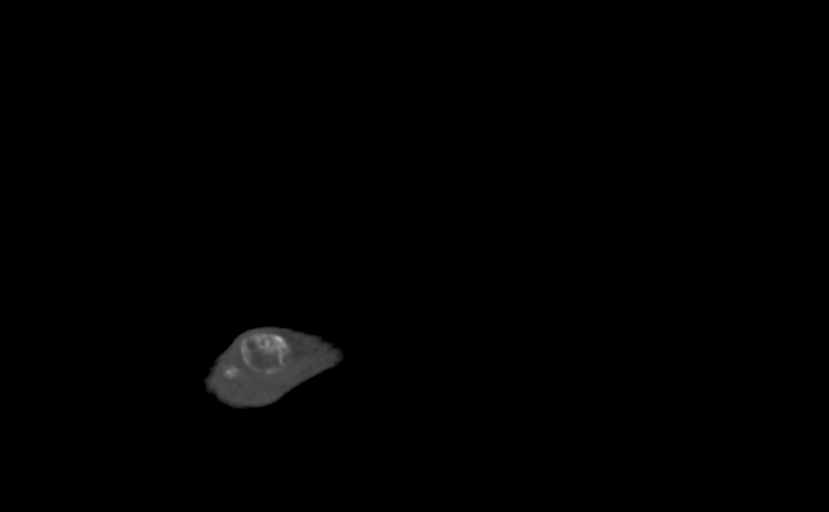
[im 92/111  bone]
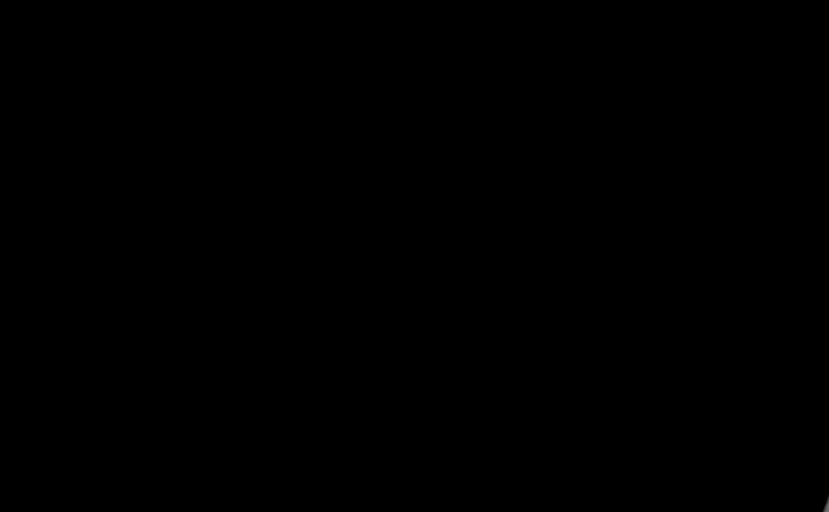

[13 of 27 positions shown; findings below may reference images not displayed]

FINDINGS: Bones/Joint/Cartilage

The osseous structures appear diffusely demineralized.

There conspicuous transcortical lucencies involving the first distal
phalanx as well as the head and base of the first proximal phalanx.
Some more chronic resorptive changes of distal phalanx could reflect
sequela prior infection as well. Soft tissue ulceration is noted at
the level of the posterior calcaneus some slight cortical
irregularity and possible early periostitis features which could
reflect sequela of acute or chronic osteomyelitis. Hammertoe
deformities are noted at the second through fourth rays including
inferior subluxation across the second PIP with likely fracture
cross the proximal phalangeal head. Sclerotic and irregular changes
at the hallucal sesamoidal complexes could reflect sequela of prior
sesamoiditis. Note is made of a significant hallux valgus deformity
of the first ray as well. Additional degenerative changes noted
throughout foot. No acute acute fracture or traumatic malalignment.
Additional degenerative changes noted in the ankle.

Ligaments

Suboptimally assessed by CT.

Muscles and Tendons

Diffuse muscular atrophy of the foot. No clearly retracted or torn
tendons are seen. Extensive soft tissue stranding is noted along the
dorsal and plantar tendon sheaths particularly of the forefoot.

Soft tissues

Extensive soft tissue swelling of the foot and lower extremity
edema. Focal ulceration at the tip of the first digit and along the
posterior calcaneus, as described above. No sizable ankle joint
effusion. No other organized fluid collection is evident. Extensive
vascular calcium.
IMPRESSION: 1. Extensive soft tissue swelling thickening of the foot most
pronounced about the first ray and posterior to the calcaneus with
there is focal soft tissue. Subjacent areas of cortical irregularity
are seen involving the first proximal and distal phalanges
concerning for osteomyelitis. Some additional cortical irregularity
and periostitis of the calcaneus could reflect an additional site of
acute or chronic osteomyelitis. MR imaging, ideally with contrast if
tolerable, is a more sensitive and specific modality further
determination of osteomyelitis.
2. Additional chronic resorptive changes of the distal phalanx could
reflect sequela of more remote small during chronic osteomyelitis as
well.
3. Likely posttraumatic deformity of the head of the second proximal
phalanx with volar subluxation across the second PIP.
4. Hammertoe deformities at the second through fourth rays.
5. Sclerotic and irregular changes at the hallucal sesamoidal
complexes could reflect sequela of prior sesamoiditis.
6. Hallux valgus deformity and bunion formation of foot.
7. Extensive soft tissue swelling of the foot and lower extremity
edema.
